# Patient Record
Sex: Male | Born: 1965 | Race: White | Hispanic: No | Smoking: Former smoker
Health system: Southern US, Community
[De-identification: ages and names within clinical notes are randomized; demographics above are authoritative.]

## PROBLEM LIST (undated history)

## (undated) DIAGNOSIS — B019 Varicella without complication: Secondary | ICD-10-CM

## (undated) DIAGNOSIS — T50995A Adverse effect of other drugs, medicaments and biological substances, initial encounter: Secondary | ICD-10-CM

## (undated) DIAGNOSIS — F418 Other specified anxiety disorders: Secondary | ICD-10-CM

## (undated) DIAGNOSIS — I10 Essential (primary) hypertension: Secondary | ICD-10-CM

## (undated) DIAGNOSIS — K55059 Acute (reversible) ischemia of intestine, part and extent unspecified: Secondary | ICD-10-CM

## (undated) DIAGNOSIS — I4891 Unspecified atrial fibrillation: Principal | ICD-10-CM

## (undated) DIAGNOSIS — R0683 Snoring: Secondary | ICD-10-CM

## (undated) DIAGNOSIS — I5022 Chronic systolic (congestive) heart failure: Secondary | ICD-10-CM

## (undated) DIAGNOSIS — Z7901 Long term (current) use of anticoagulants: Secondary | ICD-10-CM

## (undated) DIAGNOSIS — G43909 Migraine, unspecified, not intractable, without status migrainosus: Secondary | ICD-10-CM

## (undated) DIAGNOSIS — E669 Obesity, unspecified: Secondary | ICD-10-CM

## (undated) HISTORY — DX: Long term (current) use of anticoagulants: Z79.01

## (undated) HISTORY — DX: Obesity, unspecified: E66.9

## (undated) HISTORY — DX: Other specified anxiety disorders: F41.8

## (undated) HISTORY — PX: HERNIA REPAIR: SHX51

## (undated) HISTORY — PX: TONSILLECTOMY: SUR1361

## (undated) HISTORY — PX: COLON SURGERY: SHX602

## (undated) HISTORY — DX: Varicella without complication: B01.9

## (undated) HISTORY — DX: Migraine, unspecified, not intractable, without status migrainosus: G43.909

---

## 1999-02-23 ENCOUNTER — Emergency Department (HOSPITAL_COMMUNITY): Admission: EM | Admit: 1999-02-23 | Discharge: 1999-02-24 | Payer: Self-pay | Admitting: Emergency Medicine

## 1999-02-24 ENCOUNTER — Encounter: Payer: Self-pay | Admitting: Emergency Medicine

## 2000-09-26 ENCOUNTER — Emergency Department (HOSPITAL_COMMUNITY): Admission: EM | Admit: 2000-09-26 | Discharge: 2000-09-26 | Payer: Self-pay | Admitting: Emergency Medicine

## 2001-03-28 ENCOUNTER — Encounter: Payer: Self-pay | Admitting: Emergency Medicine

## 2001-03-28 ENCOUNTER — Emergency Department (HOSPITAL_COMMUNITY): Admission: EM | Admit: 2001-03-28 | Discharge: 2001-03-28 | Payer: Self-pay | Admitting: Emergency Medicine

## 2004-04-15 ENCOUNTER — Ambulatory Visit: Payer: Self-pay | Admitting: Internal Medicine

## 2004-05-10 ENCOUNTER — Emergency Department (HOSPITAL_COMMUNITY): Admission: EM | Admit: 2004-05-10 | Discharge: 2004-05-10 | Payer: Self-pay | Admitting: *Deleted

## 2005-04-13 ENCOUNTER — Ambulatory Visit: Payer: Self-pay | Admitting: Internal Medicine

## 2011-06-27 ENCOUNTER — Encounter: Payer: Self-pay | Admitting: Family Medicine

## 2011-06-27 ENCOUNTER — Ambulatory Visit (INDEPENDENT_AMBULATORY_CARE_PROVIDER_SITE_OTHER): Payer: Self-pay | Admitting: Family Medicine

## 2011-06-27 VITALS — BP 168/87 | HR 121 | Temp 97.4°F | Ht 70.0 in | Wt 320.8 lb

## 2011-06-27 DIAGNOSIS — E669 Obesity, unspecified: Secondary | ICD-10-CM

## 2011-06-27 DIAGNOSIS — F329 Major depressive disorder, single episode, unspecified: Secondary | ICD-10-CM

## 2011-06-27 DIAGNOSIS — G43909 Migraine, unspecified, not intractable, without status migrainosus: Secondary | ICD-10-CM

## 2011-06-27 DIAGNOSIS — J40 Bronchitis, not specified as acute or chronic: Secondary | ICD-10-CM

## 2011-06-27 DIAGNOSIS — F341 Dysthymic disorder: Secondary | ICD-10-CM

## 2011-06-27 DIAGNOSIS — R7309 Other abnormal glucose: Secondary | ICD-10-CM

## 2011-06-27 DIAGNOSIS — R739 Hyperglycemia, unspecified: Secondary | ICD-10-CM

## 2011-06-27 DIAGNOSIS — F418 Other specified anxiety disorders: Secondary | ICD-10-CM

## 2011-06-27 MED ORDER — CITALOPRAM HYDROBROMIDE 20 MG PO TABS: ORAL_TABLET | ORAL | Status: DC

## 2011-06-27 MED ORDER — AMOXICILLIN-POT CLAVULANATE 875-125 MG PO TABS: 1.0000 | ORAL_TABLET | Freq: Two times a day (BID) | ORAL | Status: AC

## 2011-06-27 NOTE — Patient Instructions (Signed)
Preventive Care for Adults, Male A healthy lifestyle and preventative care can promote health and wellness. Preventative health guidelines for men include the following key practices:  A routine yearly physical is a good way to check with your caregiver about your health and preventative screening. It is a chance to share any concerns and updates on your health, and to receive a thorough exam.   Visit your dentist for a routine exam and preventative care every 6 months. Brush your teeth twice a day and floss once a day. Good oral hygiene prevents tooth decay and gum disease.   The frequency of eye exams is based on your age, health, family medical history, use of contact lenses, and other factors. Follow your caregiver's recommendations for frequency of eye exams.   Eat a healthy diet. Foods like vegetables, fruits, whole grains, low-fat dairy products, and lean protein foods contain the nutrients you need without too many calories. Decrease your intake of foods high in solid fats, added sugars, and salt. Eat the right amount of calories for you.Get information about a proper diet from your caregiver, if necessary.   Regular physical exercise is one of the most important things you can do for your health. Most adults should get at least 150 minutes of moderate-intensity exercise (any activity that increases your heart rate and causes you to sweat) each week. In addition, most adults need muscle-strengthening exercises on 2 or more days a week.   Maintain a healthy weight. The body mass index (BMI) is a screening tool to identify possible weight problems. It provides an estimate of body fat based on height and weight. Your caregiver can help determine your BMI, and can help you achieve or maintain a healthy weight.For adults 20 years and older:   A BMI below 18.5 is considered underweight.   A BMI of 18.5 to 24.9 is normal.   A BMI of 25 to 29.9 is considered overweight.   A BMI of 30 and above  is considered obese.   Maintain normal blood lipids and cholesterol levels by exercising and minimizing your intake of saturated fat. Eat a balanced diet with plenty of fruit and vegetables. Blood tests for lipids and cholesterol should begin at age 20 and be repeated every 5 years. If your lipid or cholesterol levels are high, you are over 50, or you are a high risk for heart disease, you may need your cholesterol levels checked more frequently.Ongoing high lipid and cholesterol levels should be treated with medicines if diet and exercise are not effective.   If you smoke, find out from your caregiver how to quit. If you do not use tobacco, do not start.   If you choose to drink alcohol, do not exceed 2 drinks per day. One drink is considered to be 12 ounces (355 mL) of beer, 5 ounces (148 mL) of wine, or 1.5 ounces (44 mL) of liquor.   Avoid use of street drugs. Do not share needles with anyone. Ask for help if you need support or instructions about stopping the use of drugs.   High blood pressure causes heart disease and increases the risk of stroke. Your blood pressure should be checked at least every 1 to 2 years. Ongoing high blood pressure should be treated with medicines, if weight loss and exercise are not effective.   If you are 45 to 44 years old, ask your caregiver if you should take aspirin to prevent heart disease.   Diabetes screening involves taking a blood   sample to check your fasting blood sugar level. This should be done once every 3 years, after age 45, if you are within normal weight and without risk factors for diabetes. Testing should be considered at a younger age or be carried out more frequently if you are overweight and have at least 1 risk factor for diabetes.   Colorectal cancer can be detected and often prevented. Most routine colorectal cancer screening begins at the age of 50 and continues through age 75. However, your caregiver may recommend screening at an earlier  age if you have risk factors for colon cancer. On a yearly basis, your caregiver may provide home test kits to check for hidden blood in the stool. Use of a small camera at the end of a tube, to directly examine the colon (sigmoidoscopy or colonoscopy), can detect the earliest forms of colorectal cancer. Talk to your caregiver about this at age 50, when routine screening begins. Direct examination of the colon should be repeated every 5 to 10 years through age 75, unless early forms of pre-cancerous polyps or small growths are found.   Hepatitis C blood testing is recommended for all people born from 1945 through 1965 and any individual with known risks for hepatitis C.   Practice safe sex. Use condoms and avoid high-risk sexual practices to reduce the spread of sexually transmitted infections (STIs). STIs include gonorrhea, chlamydia, syphilis, trichomonas, herpes, HPV, and human immunodeficiency virus (HIV). Herpes, HIV, and HPV are viral illnesses that have no cure. They can result in disability, cancer, and death.   A one-time screening for abdominal aortic aneurysm (AAA) and surgical repair of large AAAs by sound wave imaging (ultrasonography) is recommended for ages 65 to 75 years who are current or former smokers.   Healthy men should no longer receive prostate-specific antigen (PSA) blood tests as part of routine cancer screening. Consult with your caregiver about prostate cancer screening.   Testicular cancer screening is not recommended for adult males who have no symptoms. Screening includes self-exam, caregiver exam, and other screening tests. Consult with your caregiver about any symptoms you have or any concerns you have about testicular cancer.   Use sunscreen with skin protection factor (SPF) of 30 or more. Apply sunscreen liberally and repeatedly throughout the day. You should seek shade when your shadow is shorter than you. Protect yourself by wearing long sleeves, pants, a  wide-brimmed hat, and sunglasses year round, whenever you are outdoors.   Once a month, do a whole body skin exam, using a mirror to look at the skin on your back. Notify your caregiver of new moles, moles that have irregular borders, moles that are larger than a pencil eraser, or moles that have changed in shape or color.   Stay current with required immunizations.   Influenza. You need a dose every fall (or winter). The composition of the flu vaccine changes each year, so being vaccinated once is not enough.   Pneumococcal polysaccharide. You need 1 to 2 doses if you smoke cigarettes or if you have certain chronic medical conditions. You need 1 dose at age 65 (or older) if you have never been vaccinated.   Tetanus, diphtheria, pertussis (Tdap, Td). Get 1 dose of Tdap vaccine if you are younger than age 65 years, are over 65 and have contact with an infant, are a healthcare worker, or simply want to be protected from whooping cough. After that, you need a Td booster dose every 10 years. Consult your caregiver if   you have not had at least 3 tetanus and diphtheria-containing shots sometime in your life or have a deep or dirty wound.   HPV. This vaccine is recommended for males 13 through 44 years of age. This vaccine may be given to men 22 through 44 years of age who have not completed the 3 dose series. It is recommended for men through age 26 who have sex with men or whose immune system is weakened because of HIV infection, other illness, or medications. The vaccine is given in 3 doses over 6 months.   Measles, mumps, rubella (MMR). You need at least 1 dose of MMR if you were born in 1957 or later. You may also need a 2nd dose.   Meningococcal. If you are age 19 to 21 years and a first-year college student living in a residence hall, or have one of several medical conditions, you need to get vaccinated against meningococcal disease. You may also need additional booster doses.   Zoster (shingles).  If you are age 60 years or older, you should get this vaccine.   Varicella (chickenpox). If you have never had chickenpox or you were vaccinated but received only 1 dose, talk to your caregiver to find out if you need this vaccine.   Hepatitis A. You need this vaccine if you have a specific risk factor for hepatitis A virus infection, or you simply wish to be protected from this disease. The vaccine is usually given as 2 doses, 6 to 18 months apart.   Hepatitis B. You need this vaccine if you have a specific risk factor for hepatitis B virus infection or you simply wish to be protected from this disease. The vaccine is given in 3 doses, usually over 6 months.  Preventative Service / Frequency Ages 19 to 39  Blood pressure check.** / Every 1 to 2 years.   Lipid and cholesterol check.** / Every 5 years beginning at age 20.   Hepatitis C blood test.** / For any individual with known risks for hepatitis C.   Skin self-exam. / Monthly.   Influenza immunization.** / Every year.   Pneumococcal polysaccharide immunization.** / 1 to 2 doses if you smoke cigarettes or if you have certain chronic medical conditions.   Tetanus, diphtheria, pertussis (Tdap,Td) immunization. / A one-time dose of Tdap vaccine. After that, you need a Td booster dose every 10 years.   HPV immunization. / 3 doses over 6 months, if 26 and younger.   Measles, mumps, rubella (MMR) immunization. / You need at least 1 dose of MMR if you were born in 1957 or later. You may also need a 2nd dose.   Meningococcal immunization. / 1 dose if you are age 19 to 21 years and a first-year college student living in a residence hall, or have one of several medical conditions, you need to get vaccinated against meningococcal disease. You may also need additional booster doses.   Varicella immunization.** / Consult your caregiver.   Hepatitis A immunization.** / Consult your caregiver. 2 doses, 6 to 18 months apart.   Hepatitis B  immunization.** / Consult your caregiver. 3 doses usually over 6 months.  Ages 40 to 64  Blood pressure check.** / Every 1 to 2 years.   Lipid and cholesterol check.** / Every 5 years beginning at age 20.   Fecal occult blood test (FOBT) of stool. / Every year beginning at age 50 and continuing until age 75. You may not have to do this test if   you get colonoscopy every 10 years.   Flexible sigmoidoscopy** or colonoscopy.** / Every 5 years for a flexible sigmoidoscopy or every 10 years for a colonoscopy beginning at age 50 and continuing until age 75.   Hepatitis C blood test.** / For all people born from 1945 through 1965 and any individual with known risks for hepatitis C.   Skin self-exam. / Monthly.   Influenza immunization.** / Every year.   Pneumococcal polysaccharide immunization.** / 1 to 2 doses if you smoke cigarettes or if you have certain chronic medical conditions.   Tetanus, diphtheria, pertussis (Tdap/Td) immunization.** / A one-time dose of Tdap vaccine. After that, you need a Td booster dose every 10 years.   Measles, mumps, rubella (MMR) immunization. / You need at least 1 dose of MMR if you were born in 1957 or later. You may also need a 2nd dose.   Varicella immunization.**/ Consult your caregiver.   Meningococcal immunization.** / Consult your caregiver.   Hepatitis A immunization.** / Consult your caregiver. 2 doses, 6 to 18 months apart.   Hepatitis B immunization.** / Consult your caregiver. 3 doses, usually over 6 months.  Ages 65 and over  Blood pressure check.** / Every 1 to 2 years.   Lipid and cholesterol check.**/ Every 5 years beginning at age 20.   Fecal occult blood test (FOBT) of stool. / Every year beginning at age 50 and continuing until age 75. You may not have to do this test if you get colonoscopy every 10 years.   Flexible sigmoidoscopy** or colonoscopy.** / Every 5 years for a flexible sigmoidoscopy or every 10 years for a colonoscopy  beginning at age 50 and continuing until age 75.   Hepatitis C blood test.** / For all people born from 1945 through 1965 and any individual with known risks for hepatitis C.   Abdominal aortic aneurysm (AAA) screening.** / A one-time screening for ages 65 to 75 years who are current or former smokers.   Skin self-exam. / Monthly.   Influenza immunization.** / Every year.   Pneumococcal polysaccharide immunization.** / 1 dose at age 65 (or older) if you have never been vaccinated.   Tetanus, diphtheria, pertussis (Tdap, Td) immunization. / A one-time dose of Tdap vaccine if you are over 65 and have contact with an infant, are a healthcare worker, or simply want to be protected from whooping cough. After that, you need a Td booster dose every 10 years.   Varicella immunization. ** / Consult your caregiver.   Meningococcal immunization.** / Consult your caregiver.   Hepatitis A immunization. ** / Consult your caregiver. 2 doses, 6 to 18 months apart.   Hepatitis B immunization.** / Check with your caregiver. 3 doses, usually over 6 months.  **Family history and personal history of risk and conditions may change your caregiver's recommendations. Document Released: 03/01/2001 Document Revised: 12/23/2010 Document Reviewed: 05/31/2010 [employer] Patient Information 2012 [employer]. 

## 2011-06-28 LAB — COMPREHENSIVE METABOLIC PANEL
ALT: 38 U/L (ref 0–53)
AST: 24 U/L (ref 0–37)
Albumin: 3.5 g/dL (ref 3.5–5.2)
Alkaline Phosphatase: 69 U/L (ref 39–117)
BUN: 14 mg/dL (ref 6–23)
CO2: 31 mEq/L (ref 19–32)
Calcium: 9.3 mg/dL (ref 8.4–10.5)
Chloride: 104 mEq/L (ref 96–112)
Creatinine, Ser: 1 mg/dL (ref 0.4–1.5)
GFR: 86.34 mL/min (ref >60.00)
Glucose, Bld: 110 mg/dL — ABNORMAL HIGH (ref 70–99)
Potassium: 4.5 mEq/L (ref 3.5–5.1)
Sodium: 140 mEq/L (ref 135–145)
Total Bilirubin: 0.4 mg/dL (ref 0.3–1.2)
Total Protein: 6.9 g/dL (ref 6.0–8.3)

## 2011-06-28 LAB — TSH: TSH: 0.9 u[IU]/mL (ref 0.35–5.50)

## 2011-06-29 ENCOUNTER — Encounter: Payer: Self-pay | Admitting: Family Medicine

## 2011-06-29 DIAGNOSIS — E669 Obesity, unspecified: Secondary | ICD-10-CM

## 2011-06-29 DIAGNOSIS — F418 Other specified anxiety disorders: Secondary | ICD-10-CM

## 2011-06-29 DIAGNOSIS — J40 Bronchitis, not specified as acute or chronic: Secondary | ICD-10-CM | POA: Insufficient documentation

## 2011-06-29 DIAGNOSIS — G43909 Migraine, unspecified, not intractable, without status migrainosus: Secondary | ICD-10-CM

## 2011-06-29 HISTORY — DX: Other specified anxiety disorders: F41.8

## 2011-06-29 HISTORY — DX: Obesity, unspecified: E66.9

## 2011-06-29 HISTORY — DX: Migraine, unspecified, not intractable, without status migrainosus: G43.909

## 2011-06-29 NOTE — Progress Notes (Signed)
Patient ID: William Fischer, male   DOB: 25-Aug-1965, 46 y.o.   MRN: [id number] William Fischer [id number] 08-31-1965 06/29/2011      Progress Note-Follow Up  Subjective  Chief Complaint  Chief Complaint  Patient presents with  . Establish Care    new patient  . chest congestion    X 1 week, wheezing    HPI  Patient is a 46 year old Caucasian male who is in today for urgent new patient appointment. He has been struggling with a cough and congestion now for roughly a week. He is struggling with bad headaches, congestion and rhinorrhea. He's been coughing sometimes productive sometimes dry. The cough is keeping him up at night. He is noting wheezing mostly at night. Has a lot of postnasal drip and cough some brownish phlegm. Denies any nausea vomiting but does have some loose stool daily. Does have some shortness of breath. Denies any chest pain or palpitations. Acknowledges she's under a great deal of stress and is dealing with some depression anxiety but does not have any suicidal ideation. He's been having a lot of headaches and does think that he would cover for them. Has had some recent fevers and chills. Is also noting that he is having trouble with his legs giving violaceous at times. No pain is associated no cold or hot sensation is noted.  Past Medical History  Diagnosis Date  . Chicken pox as a child  . Migraine 06/29/2011  . Bronchitis 06/29/2011  . Depression with anxiety 06/29/2011  . Obesity 06/29/2011    History reviewed. No pertinent past surgical history.  Family History  Problem Relation Age of Onset  . Hypertension Mother   . Hypertension Father   . Aneurysm Father   . Alcohol abuse Father   . Cancer Maternal Grandmother     brain  . Diabetes Maternal Grandfather     type 2  . Parkinsonism Maternal Grandfather   . Cancer Paternal Grandmother     breast  . Diabetes Paternal Grandmother   . Alcohol abuse Paternal Grandfather     History   Social History  .  Marital Status: Divorced    Spouse Name: N/A    Number of Children: N/A  . Years of Education: N/A   Occupational History  . Not on file.   Social History Main Topics  . Smoking status: Former Smoker -- 1.0 packs/day for 20 years    Types: Cigarettes    Quit date: 01/18/2000  . Smokeless tobacco: Never Used  . Alcohol Use: No  . Drug Use: No  . Sexually Active: No   Other Topics Concern  . Not on file   Social History Narrative  . No narrative on file    Current Outpatient Prescriptions on File Prior to Visit  Medication Sig Dispense Refill  . citalopram (CELEXA) 20 MG tablet 1/2 tab po daily x 7 days and then increase to 1 tab po daily  30 tablet  3    No Known Allergies  Review of Systems  Review of Systems  Constitutional: Negative for fever, chills and malaise/fatigue.  HENT: Positive for congestion and sore throat. Negative for hearing loss and nosebleeds.   Eyes: Negative for discharge.  Respiratory: Positive for cough, sputum production and shortness of breath. Negative for wheezing.   Cardiovascular: Negative for chest pain, palpitations and leg swelling.  Gastrointestinal: Negative for heartburn, nausea, vomiting, abdominal pain, diarrhea, constipation and blood in stool.  Genitourinary: Negative for dysuria, urgency, frequency and  hematuria.  Musculoskeletal: Negative for myalgias, back pain and falls.  Skin: Negative for rash.  Neurological: Positive for headaches. Negative for dizziness, tremors, sensory change, focal weakness, loss of consciousness and weakness.  Endo/Heme/Allergies: Negative for polydipsia. Does not bruise/bleed easily.  Psychiatric/Behavioral: Negative for depression and suicidal ideas. The patient is not nervous/anxious and does not have insomnia.     Objective  BP 168/87  Pulse 121  Temp 97.4 F (36.3 C) (Temporal)  Ht 5\' 10"  (1.778 m)  Wt 320 lb 12.8 oz (145.514 kg)  BMI 46.03 kg/m2  SpO2 95%  Physical Exam  Physical Exam   Constitutional: He is oriented to person, place, and time and well-developed, well-nourished, and in no distress. No distress.  HENT:  Head: Normocephalic and atraumatic.       TMs dull and fluid noted. Nasal mucosa boggy and erythematous  Eyes: Conjunctivae are normal.  Neck: Neck supple. No thyromegaly present.  Cardiovascular: Normal rate, regular rhythm and normal heart sounds.   No murmur heard. Pulmonary/Chest: Effort normal. No respiratory distress.       Decreased breath sounds b/l bases  Abdominal: He exhibits no distension and no mass. There is no tenderness.  Musculoskeletal: He exhibits no edema.  Neurological: He is alert and oriented to person, place, and time.  Skin: Skin is warm.  Psychiatric: Memory, affect and judgment normal.    Lab Results  Component Value Date   TSH 0.90 06/27/2011   No results found for this basename: WBC, HGB, HCT, MCV, PLT   Lab Results  Component Value Date   CREATININE 1.0 06/27/2011   BUN 14 06/27/2011   NA 140 06/27/2011   K 4.5 06/27/2011   CL 104 06/27/2011   CO2 31 06/27/2011   Lab Results  Component Value Date   ALT 38 06/27/2011   AST 24 06/27/2011   ALKPHOS 69 06/27/2011   BILITOT 0.4 06/27/2011     Assessment & Plan  Bronchitis Started on antibiotics and mucinex, increase rest and fluids, report if no improvement  Depression with anxiety Started on citalopram 10 mg po daily x 7days and then increase to 20 mg daily.  Migraine Takes too many Goody powders, encouraged to try and avoid them and try Excedrine Migraine according to the directions on the label, also discussed migraine hygiene  Obesity Given handout of DASH diet and encouraged to move more and eat less.

## 2011-06-29 NOTE — Assessment & Plan Note (Signed)
Takes too many Goody powders, encouraged to try and avoid them and try Excedrine Migraine according to the directions on the label, also discussed migraine hygiene

## 2011-06-29 NOTE — Assessment & Plan Note (Signed)
Started on antibiotics and mucinex, increase rest and fluids, report if no improvement

## 2011-06-29 NOTE — Assessment & Plan Note (Signed)
Started on citalopram 10 mg po daily x 7days and then increase to 20 mg daily.

## 2011-06-29 NOTE — Assessment & Plan Note (Signed)
Given handout of DASH diet and encouraged to move more and eat less.

## 2011-11-27 ENCOUNTER — Encounter (HOSPITAL_BASED_OUTPATIENT_CLINIC_OR_DEPARTMENT_OTHER): Payer: Self-pay | Admitting: *Deleted

## 2011-11-27 ENCOUNTER — Inpatient Hospital Stay (HOSPITAL_BASED_OUTPATIENT_CLINIC_OR_DEPARTMENT_OTHER)
Admission: EM | Admit: 2011-11-27 | Discharge: 2011-12-04 | DRG: 308 | Disposition: A | Discharge status: Home / Self Care | Payer: Medicaid Other | Attending: Internal Medicine | Admitting: Internal Medicine

## 2011-11-27 ENCOUNTER — Emergency Department (HOSPITAL_BASED_OUTPATIENT_CLINIC_OR_DEPARTMENT_OTHER): Payer: Medicaid Other

## 2011-11-27 DIAGNOSIS — I4891 Unspecified atrial fibrillation: Principal | ICD-10-CM | POA: Diagnosis present

## 2011-11-27 DIAGNOSIS — I4729 Other ventricular tachycardia: Secondary | ICD-10-CM | POA: Diagnosis not present

## 2011-11-27 DIAGNOSIS — Z6841 Body Mass Index (BMI) 40.0 and over, adult: Secondary | ICD-10-CM

## 2011-11-27 DIAGNOSIS — L270 Generalized skin eruption due to drugs and medicaments taken internally: Secondary | ICD-10-CM | POA: Diagnosis not present

## 2011-11-27 DIAGNOSIS — I509 Heart failure, unspecified: Secondary | ICD-10-CM | POA: Diagnosis present

## 2011-11-27 DIAGNOSIS — Z87891 Personal history of nicotine dependence: Secondary | ICD-10-CM

## 2011-11-27 DIAGNOSIS — T50995A Adverse effect of other drugs, medicaments and biological substances, initial encounter: Secondary | ICD-10-CM | POA: Diagnosis not present

## 2011-11-27 DIAGNOSIS — I472 Ventricular tachycardia, unspecified: Secondary | ICD-10-CM | POA: Diagnosis not present

## 2011-11-27 DIAGNOSIS — F329 Major depressive disorder, single episode, unspecified: Secondary | ICD-10-CM

## 2011-11-27 DIAGNOSIS — E739 Lactose intolerance, unspecified: Secondary | ICD-10-CM | POA: Diagnosis present

## 2011-11-27 DIAGNOSIS — Y9210 Unspecified residential institution as the place of occurrence of the external cause: Secondary | ICD-10-CM | POA: Diagnosis not present

## 2011-11-27 DIAGNOSIS — I428 Other cardiomyopathies: Secondary | ICD-10-CM | POA: Diagnosis present

## 2011-11-27 DIAGNOSIS — I5023 Acute on chronic systolic (congestive) heart failure: Secondary | ICD-10-CM | POA: Diagnosis present

## 2011-11-27 DIAGNOSIS — G4730 Sleep apnea, unspecified: Secondary | ICD-10-CM | POA: Diagnosis present

## 2011-11-27 DIAGNOSIS — I517 Cardiomegaly: Secondary | ICD-10-CM

## 2011-11-27 DIAGNOSIS — I5021 Acute systolic (congestive) heart failure: Secondary | ICD-10-CM

## 2011-11-27 DIAGNOSIS — F341 Dysthymic disorder: Secondary | ICD-10-CM | POA: Diagnosis present

## 2011-11-27 HISTORY — DX: Adverse effect of other drugs, medicaments and biological substances, initial encounter: T50.995A

## 2011-11-27 HISTORY — DX: Chronic systolic (congestive) heart failure: I50.22

## 2011-11-27 HISTORY — DX: Unspecified atrial fibrillation: I48.91

## 2011-11-27 HISTORY — DX: Snoring: R06.83

## 2011-11-27 LAB — CBC WITH DIFFERENTIAL/PLATELET
Eosinophils Relative: 4 % (ref 0–5)
HCT: 47.1 % (ref 39.0–52.0)
Lymphocytes Relative: 20 % (ref 12–46)
Lymphs Abs: 2.1 10*3/uL (ref 0.7–4.0)
MCV: 90.4 fL (ref 78.0–100.0)
Platelets: 316 10*3/uL (ref 150–400)
RBC: 5.21 MIL/uL (ref 4.22–5.81)
WBC: 10.7 10*3/uL — ABNORMAL HIGH (ref 4.0–10.5)

## 2011-11-27 LAB — COMPREHENSIVE METABOLIC PANEL
ALT: 55 U/L — ABNORMAL HIGH (ref 0–53)
Alkaline Phosphatase: 67 U/L (ref 39–117)
CO2: 25 mEq/L (ref 19–32)
Calcium: 9.6 mg/dL (ref 8.4–10.5)
GFR calc Af Amer: >90 mL/min (ref >90)
GFR calc non Af Amer: 90 mL/min — ABNORMAL LOW (ref >90)
Glucose, Bld: 115 mg/dL — ABNORMAL HIGH (ref 70–99)
Potassium: 4.2 mEq/L (ref 3.5–5.1)
Sodium: 140 mEq/L (ref 135–145)
Total Bilirubin: 0.7 mg/dL (ref 0.3–1.2)

## 2011-11-27 MED ORDER — DILTIAZEM HCL 100 MG IV SOLR
5.0000 mg/h | Freq: Once | INTRAVENOUS | Status: AC
  Administered 2011-11-27: 5 mg/h via INTRAVENOUS

## 2011-11-27 MED ORDER — FUROSEMIDE 10 MG/ML IJ SOLN
20.0000 mg | Freq: Every day | INTRAMUSCULAR | Status: DC
  Filled 2011-11-27: qty 2

## 2011-11-27 MED ORDER — DILTIAZEM HCL 100 MG IV SOLR
5.0000 mg/h | INTRAVENOUS | Status: DC
  Administered 2011-11-27: 15 mg/h via INTRAVENOUS
  Administered 2011-11-28 – 2011-11-30 (×10): 20 mg/h via INTRAVENOUS
  Filled 2011-11-27 (×2): qty 100

## 2011-11-27 MED ORDER — IOHEXOL 350 MG/ML SOLN
125.0000 mL | Freq: Once | INTRAVENOUS | Status: AC | PRN
  Administered 2011-11-27: 125 mL via INTRAVENOUS

## 2011-11-27 MED ORDER — ALPRAZOLAM 0.5 MG PO TABS
0.5000 mg | ORAL_TABLET | Freq: Three times a day (TID) | ORAL | Status: DC | PRN
  Administered 2011-11-27 – 2011-12-02 (×6): 0.5 mg via ORAL
  Filled 2011-11-27 (×7): qty 1

## 2011-11-27 MED ORDER — ASPIRIN 81 MG PO CHEW
324.0000 mg | CHEWABLE_TABLET | Freq: Once | ORAL | Status: AC
  Administered 2011-11-27: 324 mg via ORAL
  Filled 2011-11-27: qty 4

## 2011-11-27 MED ORDER — ASPIRIN EC 81 MG PO TBEC
81.0000 mg | DELAYED_RELEASE_TABLET | Freq: Every day | ORAL | Status: DC
  Administered 2011-11-28: 81 mg via ORAL
  Filled 2011-11-27 (×2): qty 1

## 2011-11-27 MED ORDER — ALBUTEROL SULFATE (5 MG/ML) 0.5% IN NEBU
INHALATION_SOLUTION | RESPIRATORY_TRACT | Status: AC
  Filled 2011-11-27: qty 1

## 2011-11-27 MED ORDER — INFLUENZA VIRUS VACC SPLIT PF IM SUSP
0.5000 mL | INTRAMUSCULAR | Status: AC
  Filled 2011-11-27: qty 0.5

## 2011-11-27 MED ORDER — SODIUM CHLORIDE 0.9 % IV SOLN
INTRAVENOUS | Status: DC | PRN
  Administered 2011-11-27: 21:00:00 via INTRAVENOUS
  Administered 2011-11-30: 500 mL via INTRAVENOUS

## 2011-11-27 MED ORDER — IPRATROPIUM BROMIDE 0.02 % IN SOLN
RESPIRATORY_TRACT | Status: AC
  Filled 2011-11-27: qty 2.5

## 2011-11-27 MED ORDER — ONDANSETRON HCL 4 MG/2ML IJ SOLN
4.0000 mg | Freq: Four times a day (QID) | INTRAMUSCULAR | Status: DC | PRN
  Filled 2011-11-27: qty 2

## 2011-11-27 MED ORDER — CITALOPRAM HYDROBROMIDE 20 MG PO TABS
20.0000 mg | ORAL_TABLET | Freq: Once | ORAL | Status: AC
  Administered 2011-11-27: 20 mg via ORAL
  Filled 2011-11-27: qty 1

## 2011-11-27 MED ORDER — DILTIAZEM HCL 100 MG IV SOLR
INTRAVENOUS | Status: AC
  Administered 2011-11-27: 20:00:00
  Filled 2011-11-27: qty 100

## 2011-11-27 MED ORDER — NITROGLYCERIN 0.4 MG SL SUBL: 0.4000 mg | SUBLINGUAL_TABLET | SUBLINGUAL | Status: DC | PRN

## 2011-11-27 MED ORDER — ENOXAPARIN SODIUM 40 MG/0.4ML ~~LOC~~ SOLN
40.0000 mg | Freq: Every day | SUBCUTANEOUS | Status: DC
  Administered 2011-11-27: 40 mg via SUBCUTANEOUS
  Filled 2011-11-27 (×2): qty 0.4

## 2011-11-27 MED ORDER — FUROSEMIDE 10 MG/ML IJ SOLN
40.0000 mg | Freq: Once | INTRAMUSCULAR | Status: AC
  Administered 2011-11-27: 40 mg via INTRAVENOUS
  Filled 2011-11-27: qty 4

## 2011-11-27 MED ORDER — DIPHENHYDRAMINE HCL 25 MG PO CAPS
25.0000 mg | ORAL_CAPSULE | Freq: Four times a day (QID) | ORAL | Status: DC | PRN
  Administered 2011-11-27 – 2011-12-01 (×2): 25 mg via ORAL
  Filled 2011-11-27 (×2): qty 1

## 2011-11-27 MED ORDER — DILTIAZEM HCL 100 MG IV SOLR
INTRAVENOUS | Status: AC
  Administered 2011-11-27: 10 mg
  Filled 2011-11-27: qty 100

## 2011-11-27 MED ORDER — BIOTENE DRY MOUTH MT LIQD
15.0000 mL | Freq: Two times a day (BID) | OROMUCOSAL | Status: DC
  Administered 2011-11-28 – 2011-12-01 (×7): 15 mL via OROMUCOSAL

## 2011-11-27 MED ORDER — ACETAMINOPHEN 325 MG PO TABS
650.0000 mg | ORAL_TABLET | ORAL | Status: DC | PRN
  Administered 2011-11-28 – 2011-12-01 (×5): 650 mg via ORAL
  Filled 2011-11-27 (×5): qty 2

## 2011-11-27 MED ORDER — PREDNISONE 50 MG PO TABS
60.0000 mg | ORAL_TABLET | Freq: Once | ORAL | Status: AC
  Administered 2011-11-27: 60 mg via ORAL
  Filled 2011-11-27: qty 1

## 2011-11-27 NOTE — ED Notes (Signed)
Pt was placed on monitor and EKG was done and given to EDP.  Family is at the bedside

## 2011-11-27 NOTE — ED Notes (Signed)
Pt presents to ED today with [facility] for the last 5 days that has steadily gotten worse.  Pt has no other cold URI sx

## 2011-11-27 NOTE — H&P (Signed)
William Fischer is an 46 y.o. male.    Chief Complaint: Shortness of breath for 5 days  HPI: 46 y/o male with no significant PMH presenting as a transfer from [place] where he was originally seen for evaluation of shortness of breath that started 5 days prior to presentation.  He reports rest and exertional shortness of breath for the last 5 days. He denies any chest pain, nausea, vomiting, PND or syncope.  At outside hospital, he was found to be in AFIB with RVR (134 bpm). He was started on Cardizem drip for rate control.  Because his D-Dimers was positive, he underwent CTA chest (PE protocol) which was negative for pulmonary embolus.  Unfortunately, he developed a diffuse macular rash all over his body afterwards.  He has no prior history of CHF, stroke, TIA, HTN or DM.  Currently, he is clinically stable.  Past Medical History  Diagnosis Date  . Chicken pox as a child  . Migraine 06/29/2011  . Bronchitis 06/29/2011  . Obesity 06/29/2011  . Depression with anxiety 06/29/2011    History reviewed. No pertinent past surgical history.  Family History  Problem Relation Age of Onset  . Hypertension Mother   . Hypertension Father   . Aneurysm Father   . Alcohol abuse Father   . Cancer Maternal Grandmother     brain  . Diabetes Maternal Grandfather     type 2  . Parkinsonism Maternal Grandfather   . Cancer Paternal Grandmother     breast  . Diabetes Paternal Grandmother   . Alcohol abuse Paternal Grandfather    Social History:  reports that he quit smoking about 11 years ago. His smoking use included Cigarettes. He has a 20 pack-year smoking history. He has never used smokeless tobacco. He reports that he does not drink alcohol or use illicit drugs.  Allergies: No Known Allergies  Medications Prior to Admission  Medication Sig Dispense Refill  . Ascorbic Acid (VITAMIN C) 1000 MG tablet Take 1,000 mg by mouth daily.      [name] b complex vitamins tablet Take 1 tablet by mouth daily.        . citalopram (CELEXA) 20 MG tablet 1/2 tab po daily x 7 days and then increase to 1 tab po daily  30 tablet  3  . Multiple Vitamins-Minerals (ONE-A-DAY MENS HEALTH FORMULA PO) Take 1 tablet by mouth daily.      . Pseudoeph-Doxylamine-DM-APAP (NYQUIL PO) Take by mouth.        Results for orders placed during the hospital encounter of 11/27/11 (from the past 48 hour(s))  TROPONIN I     Status: Normal   Collection Time   11/27/11  2:20 PM      Component Value Range Comment   Troponin I <0.30  <0.30 ng/mL   D-DIMER, QUANTITATIVE     Status: Abnormal   Collection Time   11/27/11  2:20 PM      Component Value Range Comment   D-Dimer, Quant 2.16 (*) 0.00 - 0.48 ug/mL-FEU   CBC WITH DIFFERENTIAL     Status: Abnormal   Collection Time   11/27/11  2:20 PM      Component Value Range Comment   WBC 10.7 (*) 4.0 - 10.5 K/uL    RBC 5.21  4.22 - 5.81 MIL/uL    Hemoglobin 15.8  13.0 - 17.0 g/dL    HCT 16.1  09.6 - 04.5 %    MCV 90.4  78.0 - 100.0 fL  MCH 30.3  26.0 - 34.0 pg    MCHC 33.5  30.0 - 36.0 g/dL    RDW 45.4  09.8 - 11.9 %    Platelets 316  150 - 400 K/uL    Neutrophils Relative 68  43 - 77 %    Neutro Abs 7.2  1.7 - 7.7 K/uL    Lymphocytes Relative 20  12 - 46 %    Lymphs Abs 2.1  0.7 - 4.0 K/uL    Monocytes Relative 9  3 - 12 %    Monocytes Absolute 0.9  0.1 - 1.0 K/uL    Eosinophils Relative 4  0 - 5 %    Eosinophils Absolute 0.4  0.0 - 0.7 K/uL    Basophils Relative 0  0 - 1 %    Basophils Absolute 0.0  0.0 - 0.1 K/uL   COMPREHENSIVE METABOLIC PANEL     Status: Abnormal   Collection Time   11/27/11  2:20 PM      Component Value Range Comment   Sodium 140  135 - 145 mEq/L    Potassium 4.2  3.5 - 5.1 mEq/L    Chloride 102  96 - 112 mEq/L    CO2 25  19 - 32 mEq/L    Glucose, Bld 115 (*) 70 - 99 mg/dL    BUN 19  6 - 23 mg/dL    Creatinine, Ser 1.47  0.50 - 1.35 mg/dL    Calcium 9.6  8.4 - 82.9 mg/dL    Total Protein 7.5  6.0 - 8.3 g/dL    Albumin 3.6  3.5 - 5.2 g/dL     AST 31  0 - 37 U/L    ALT 55 (*) 0 - 53 U/L    Alkaline Phosphatase 67  39 - 117 U/L    Total Bilirubin 0.7  0.3 - 1.2 mg/dL    GFR calc non Af Amer 90 (*) >90 mL/min    GFR calc Af Amer >90  >90 mL/min    Ct Angio Chest Pe W/cm &/or Wo Cm  11/27/2011  *RADIOLOGY REPORT*  Clinical Data: Elevated D-dimer.  Rule out pulmonary embolus. Shortness of breath.  Increased heart rate.  CT ANGIOGRAPHY CHEST  Technique:  Multidetector CT imaging of the chest using the standard protocol during bolus administration of intravenous contrast. Multiplanar reconstructed images including MIPs were obtained and reviewed to evaluate the vascular anatomy.  Contrast: OMNIPAQUE IOHEXOL 350 MG/ML SOLN  Comparison: Chest x-ray 11/27/2011  Findings: The pulmonary arteries are well opacified.  There is no evidence for acute pulmonary embolus.  The heart is enlarged.  There are diffusely slightly enlarged mediastinal and hilar lymph nodes.  Largest nodes are identified in the mediastinum, measuring 2.2 cm in the prevascular space and 2.3 cm in the right paratracheal region.  There are bilateral pleural effusions, small.  Patchy mosaic changes are identified within the lung bases dependently, most consistent with mild pulmonary edema.  Images of the upper abdomen show a small amount.  Pelvic fluid.  Visualized osseous structures have a normal appearance.  IMPRESSION:  1.  Technically adequate exam showing no evidence for acute pulmonary embolus. 2.  Cardiomegaly and changes of pulmonary edema. 3.  Mediastinal and hilar lymph nodes are favored to be related to pulmonary edema.   Original Report Authenticated By: Norva Pavlov, M.D.    Dg Chest Port 1 View  11/27/2011  *RADIOLOGY REPORT*  Clinical Data: Shortness of breath.  PORTABLE CHEST - 1  VIEW  Comparison: None.  Findings: There is evidence of moderate CHF and associated cardiomegaly.  No significant pleural effusions.  IMPRESSION: CHF and cardiomegaly.   Original  Report Authenticated By: Irish Lack, M.D.     Review of Systems  Constitutional: Negative for fever, chills, weight loss, malaise/fatigue and diaphoresis.  HENT: Negative for hearing loss, ear pain, nosebleeds, congestion, neck pain, tinnitus and ear discharge.   Eyes: Negative for blurred vision, double vision, photophobia, pain and discharge.  Respiratory: Positive for shortness of breath. Negative for cough, hemoptysis, sputum production, wheezing and stridor.   Cardiovascular: Negative for chest pain, palpitations, orthopnea, claudication, leg swelling and PND.  Gastrointestinal: Negative for heartburn, nausea, vomiting, abdominal pain, diarrhea, constipation, blood in stool and melena.  Genitourinary: Negative for dysuria, urgency and frequency.  Musculoskeletal: Negative for myalgias, back pain and joint pain.  Skin: Positive for rash. Negative for itching.  Neurological: Negative for dizziness, tingling, tremors, sensory change, speech change, focal weakness, seizures, weakness and headaches.  Psychiatric/Behavioral: Negative for hallucinations. The patient does not have insomnia.     Blood pressure 120/78, pulse 111, temperature 97.9 F (36.6 C), temperature source Oral, resp. rate 26, height 5\' 11"  (1.803 m), weight 147.8 kg (325 lb 13.4 oz), SpO2 95.00%. Physical Exam  Constitutional: He is oriented to person, place, and time. He appears well-developed and well-nourished. No distress.  HENT:  Head: Normocephalic and atraumatic.  Eyes: EOM are normal. No scleral icterus.  Neck: No JVD present. No thyromegaly present.  Cardiovascular: Exam reveals no friction rub.   No murmur heard.      Irregularly irregular rhythm  Respiratory: No respiratory distress. He has no wheezes. He has no rales. He exhibits no tenderness.  GI: He exhibits no distension. There is no tenderness. There is no rebound and no guarding.  Musculoskeletal: He exhibits edema. He exhibits no tenderness.   Neurological: He is alert and oriented to person, place, and time.  Skin: Rash noted. He is not diaphoretic. No pallor.  Psychiatric: He has a normal mood and affect.     Assessment/Plan  1. AFIB with rapid ventricular response rate 2. Pulmonary edema 3. Diffuse macular rash s/p CTA chest likely secondary to contrast allergy  I will admit patient to cardiology to be observed on telemetry.  Patient is already on Cardizem drip which will be continued for now.  His CHADS-VASc score = 0 at the current time. I will keep him NPO for a TEE/cardioversion in the morning. I will start gentle diuresis tonight and obtain TEE tomorrow to see if he has LV systolic or diastolic heart failure.  Regarding his contrast allergy, patient will be give steroid and Benadryl.   Evonda Enge E 11/27/2011, 10:03 PM

## 2011-11-27 NOTE — ED Notes (Signed)
Pt states he has voided twice more.

## 2011-11-27 NOTE — ED Notes (Signed)
Cardizem drip switched to new IV site right hand for purposes of scan. Left AC site flushes easily.

## 2011-11-27 NOTE — ED Provider Notes (Signed)
History     CSN: [id number]  Arrival date & time 11/27/11  1407   First MD Initiated Contact with Patient 11/27/11 1421      Chief Complaint  Patient presents with  . Shortness of Breath    (Consider location/radiation/quality/duration/timing/severity/associated sxs/prior treatment) Patient is a 46 y.o. male presenting with shortness of breath. The history is provided by the patient. No language interpreter was used.  Shortness of Breath  Episode onset: 5 days. The onset was gradual. The problem occurs continuously. The problem has been gradually worsening. The problem is severe. Nothing relieves the symptoms. Nothing aggravates the symptoms. Associated symptoms include shortness of breath. There was no intake of a foreign body. The Heimlich maneuver was not attempted. He has not inhaled smoke recently. He has had no prior steroid use. He has had no prior hospitalizations. He has had no prior ICU admissions. He has had no prior intubations. His past medical history does not include asthma.  Pt reports he began feeling sick about 5 days ago.  Pt reports he used a flea bomb and began feeling short of breath afterwards.  Pt thought it might be the chemical that made him short of breath.   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 Chicken pox as a child  . Migraine 06/29/2011  . Bronchitis 06/29/2011  . Depression with anxiety 06/29/2011  . Obesity 06/29/2011    History reviewed. No pertinent past surgical history.  Family History  Problem Relation Age of Onset  . Hypertension Mother   . Hypertension Father   . Aneurysm Father   . Alcohol abuse Father   . Cancer Maternal Grandmother     brain  . Diabetes Maternal Grandfather     type 2  . Parkinsonism Maternal Grandfather   . Cancer Paternal Grandmother     breast  . Diabetes Paternal Grandmother   . Alcohol abuse Paternal Grandfather     History  Substance Use Topics  .  Smoking status: Former Smoker -- 1.0 packs/day for 20 years    Types: Cigarettes    Quit date: 01/18/2000  . Smokeless tobacco: Never Used  . Alcohol Use: No      Review of Systems  Respiratory: Positive for shortness of breath.   All other systems reviewed and are negative.    Allergies  Review of patient's allergies indicates no known allergies.  Home Medications   Current Outpatient Rx  Name  Route  Sig  Dispense  Refill  . VITAMIN C 1000 MG PO TABS   Oral   Take 1,000 mg by mouth daily.         . B COMPLEX PO TABS   Oral   Take 1 tablet by mouth daily.         [name] CITALOPRAM HYDROBROMIDE 20 MG PO TABS      1/2 tab po daily x 7 days and then increase to 1 tab po daily   30 tablet   3   . ONE-A-DAY MENS HEALTH FORMULA PO   Oral   Take 1 tablet by mouth daily.         . NYQUIL PO   Oral   Take by mouth.           BP 126/103  Pulse 134  Temp 97.8 F (36.6 C) (Oral)  Resp 28  SpO2 93%  Physical Exam  Nursing note and vitals reviewed. Constitutional: He is  oriented to person, place, and time. He appears well-developed and well-nourished.  HENT:  Head: Normocephalic.  Eyes: Conjunctivae normal are normal. Pupils are equal, round, and reactive to light.  Neck: Normal range of motion. Neck supple.  Cardiovascular:       Rapid tachycardic  Pulmonary/Chest: Effort normal.  Abdominal: Soft. Bowel sounds are normal.  Musculoskeletal: Normal range of motion.  Neurological: He is alert and oriented to person, place, and time.  Skin: Skin is warm.  Psychiatric: He has a normal mood and affect.    ED Course  Procedures (including critical care time)  Labs Reviewed  CBC WITH DIFFERENTIAL - Abnormal; Notable for the following:    WBC 10.7 (*)     All other components within normal limits  TROPONIN I  D-DIMER, QUANTITATIVE  COMPREHENSIVE METABOLIC PANEL   No results found.   No diagnosis found.  Patient was given 10 of Cardizem IV with no  change in rhythm he was started on a Cardizem drip at 5 and increased to 15 with no change in rate. Patient's d-dimer returned and was positive Patient's CT scan shows no PE. There are diffuse hilar lymph nodes pulmonary edema and cardiomegaly I spoke to Dr. Hillis Range cardiology on call for cardiology and patient will be admitted to [facility] for further treatment. He advised to keep patient at a Cardizem drip rate of 15 and to give Lasix 40 mg IV. Patient is advised of EKG lab and x-ray findings and the need for transfer to [place] come.  MDM     Rate: 143  Rhythm: atrial fibrillation  QRS Axis: right  Intervals: normal  ST/T Wave abnormalities: normal  Conduction Disutrbances:none  Narrative Interpretation:   Old EKG Reviewed: changes noted  CRITICAL CARE Performed by: [facility]   Total critical care time: 30 minutes  Critical care time was exclusive of separately billable procedures and treating other patients.  Critical care was necessary to treat or prevent imminent or life-threatening deterioration.  Critical care was time spent personally by me on the following activities: development of treatment plan with patient and/or surrogate as well as nursing, discussions with consultants, evaluation of patient's response to treatment, examination of patient, obtaining history from patient or surrogate, ordering and performing treatments and interventions, ordering and review of laboratory studies, ordering and review of radiographic studies, pulse oximetry and re-evaluation of patient's condition.  Lonia Skinner [place], [place] 11/27/11 [phone]

## 2011-11-27 NOTE — ED Notes (Addendum)
Patient states he has been short of breath for the past 4-5 days. States it got worse today. Patient is visibly short of breath with increased work of breathing. Notified Dr. Ranae Palms who is going to assess patient prior to administering a nebulizer treatment. Rt will continue to monitor.

## 2011-11-27 NOTE — ED Notes (Signed)
O2 increased to 4L/[place] for sats 92-93 %

## 2011-11-27 NOTE — ED Notes (Signed)
PCXR being done.

## 2011-11-27 NOTE — ED Notes (Signed)
Pt states he feels the med is helping. Encouraged to continue to work on his breathing.

## 2011-11-27 NOTE — ED Notes (Signed)
MD at bedside. 

## 2011-11-27 NOTE — ED Notes (Signed)
Returned from CT. Pt states he is feeling much better.

## 2011-11-27 NOTE — ED Notes (Signed)
Pt accompanied to CT on monitor by this RN. Tolerated procedure well.

## 2011-11-27 NOTE — ED Notes (Signed)
Sats dropped to 91% on RA. Placed on O2 2L/[place]

## 2011-11-28 ENCOUNTER — Inpatient Hospital Stay (HOSPITAL_COMMUNITY): Payer: Medicaid Other

## 2011-11-28 DIAGNOSIS — I509 Heart failure, unspecified: Secondary | ICD-10-CM

## 2011-11-28 DIAGNOSIS — I4891 Unspecified atrial fibrillation: Principal | ICD-10-CM

## 2011-11-28 LAB — TROPONIN I
Troponin I: <0.3 ng/mL (ref <0.30)
Troponin I: <0.3 ng/mL (ref <0.30)
Troponin I: <0.3 ng/mL (ref <0.30)

## 2011-11-28 LAB — LIPID PANEL
LDL Cholesterol: 97 mg/dL (ref 0–99)
Triglycerides: 50 mg/dL (ref <150)
VLDL: 10 mg/dL (ref 0–40)

## 2011-11-28 LAB — BASIC METABOLIC PANEL
BUN: 18 mg/dL (ref 6–23)
Calcium: 8.6 mg/dL (ref 8.4–10.5)
GFR calc Af Amer: >90 mL/min (ref >90)
GFR calc non Af Amer: 86 mL/min — ABNORMAL LOW (ref >90)
GFR calc non Af Amer: 78 mL/min — ABNORMAL LOW (ref >90)
Glucose, Bld: 178 mg/dL — ABNORMAL HIGH (ref 70–99)
Potassium: 4.4 mEq/L (ref 3.5–5.1)
Sodium: 137 mEq/L (ref 135–145)
Sodium: 138 mEq/L (ref 135–145)

## 2011-11-28 LAB — TSH: TSH: 1.79 u[IU]/mL (ref 0.350–4.500)

## 2011-11-28 LAB — CREATININE, SERUM: GFR calc Af Amer: >90 mL/min (ref >90)

## 2011-11-28 LAB — CBC
MCV: 92.8 fL (ref 78.0–100.0)
Platelets: 327 10*3/uL (ref 150–400)
RDW: 14.9 % (ref 11.5–15.5)
WBC: 10.4 10*3/uL (ref 4.0–10.5)

## 2011-11-28 LAB — MAGNESIUM: Magnesium: 2 mg/dL (ref 1.5–2.5)

## 2011-11-28 LAB — PROTIME-INR: Prothrombin Time: 15 seconds (ref 11.6–15.2)

## 2011-11-28 MED ORDER — DIGOXIN 125 MCG PO TABS
0.1250 mg | ORAL_TABLET | Freq: Every day | ORAL | Status: DC
  Administered 2011-11-28 – 2011-11-30 (×3): 0.125 mg via ORAL
  Filled 2011-11-28 (×4): qty 1

## 2011-11-28 MED ORDER — FUROSEMIDE 10 MG/ML IJ SOLN
80.0000 mg | Freq: Three times a day (TID) | INTRAMUSCULAR | Status: DC
  Administered 2011-11-28 – 2011-11-29 (×4): 80 mg via INTRAVENOUS
  Filled 2011-11-28 (×7): qty 8

## 2011-11-28 MED ORDER — FUROSEMIDE 10 MG/ML IJ SOLN
INTRAMUSCULAR | Status: AC
  Administered 2011-11-28: 40 mg via INTRAVENOUS
  Filled 2011-11-28: qty 4

## 2011-11-28 MED ORDER — POTASSIUM CHLORIDE CRYS ER 20 MEQ PO TBCR
40.0000 meq | EXTENDED_RELEASE_TABLET | Freq: Once | ORAL | Status: AC
  Administered 2011-11-28: 40 meq via ORAL
  Filled 2011-11-28: qty 2

## 2011-11-28 MED ORDER — DABIGATRAN ETEXILATE MESYLATE 150 MG PO CAPS
150.0000 mg | ORAL_CAPSULE | Freq: Two times a day (BID) | ORAL | Status: DC
  Administered 2011-11-28 – 2011-12-04 (×13): 150 mg via ORAL
  Filled 2011-11-28 (×14): qty 1

## 2011-11-28 MED ORDER — FUROSEMIDE 10 MG/ML IJ SOLN
40.0000 mg | Freq: Once | INTRAMUSCULAR | Status: AC
  Administered 2011-11-28: 40 mg via INTRAVENOUS

## 2011-11-28 MED ORDER — ALBUTEROL SULFATE (5 MG/ML) 0.5% IN NEBU: 2.5000 mg | INHALATION_SOLUTION | Freq: Four times a day (QID) | RESPIRATORY_TRACT | Status: DC | PRN

## 2011-11-28 NOTE — ED Provider Notes (Signed)
Medical screening examination/treatment/procedure(s) were conducted as a shared visit with Langston Masker) and myself.  I personally evaluated the patient during the encounter.  The patient presents complaining of a five day history of weakness, shortness of breath with exertion, and palpitations.  He denies chest pain, fevers, productive cough or chills.  On exam, the patient is afebrile.  The vitals are stable with the exception of a rapid, irregular heart rate.  The heart is irregular irregular and tachycardic.  The lungs have slight rales in the bases.  There is no edema.  The abdomen is benign.    Workup was initiated including ekg, labs, and chest xray.  These revealed atrial fib with rvr, cardiomegaly and apparent chf.  The troponin was negative however the ddimer was positive.  A ct scan revealed no evidence for pe.  He was started on a cardizem drip which had to be titrated upward to 15 mg in order to control his rate.  He was given lasix with diuresis.  Cardiology has been consulted and arrangements have been made for admission to [facility].  Geoffery Lyons, MD 11/28/11 [phone]

## 2011-11-28 NOTE — Progress Notes (Signed)
  Echocardiogram 2D Echocardiogram has been performed.  Cathie Beams 11/28/2011, 12:21 PM

## 2011-11-28 NOTE — Progress Notes (Addendum)
Patient: William Fischer Date of Encounter: 11/28/2011, 6:40 AM Admit date: 11/27/2011     Subjective  Desat to 88-89% on 4 L O2 early this am. Lasix 40 mg IV x 1 given with UO 900 ml.  Patient states that he feels much better this am. No SOB or chest pain when resting on the bed. Reports intermittent chest pressure associated with his fast heart rate.    Objective  Physical Exam: Vitals: BP 130/79  Pulse 115  Temp 98.5 F (36.9 C) (Oral)  Resp 27  Ht 5\' 11"  (1.803 m)  Wt 323 lb 6.6 oz (146.7 kg)  BMI 45.11 kg/m2  SpO2 93% General: NAD Neck: Supple. JVD 7-8. Lungs: Bilateral lung sounds rales noted.  Heart: Irregularly irregular rhythm without murmurs, rubs, or gallops.  Abdomen: Soft, non-distended. Extremities: No clubbing or cyanosis. No edema.  Distal pedal pulses are 2+ and equal bilaterally. Neuro: Alert and oriented X 3. Moves all extremities spontaneously. No focal deficits. Skin: No rash  Intake/Output:  Intake/Output Summary (Last 24 hours) at 11/28/11 0640 Last data filed at 11/28/11 0600  Gross per 24 hour  Intake 682.17 ml  Output   1725 ml  Net -1042.83 ml    Inpatient Medications:     . antiseptic oral rinse  15 mL Mouth Rinse BID  . [COMPLETED] aspirin  324 mg Oral Once  . aspirin EC  81 mg Oral Daily  . [COMPLETED] citalopram  20 mg Oral Once  . [COMPLETED] diltiazem (CARDIZEM) infusion  5-15 mg/hr Intravenous Once  . [COMPLETED] diltiazem      . [COMPLETED] diltiazem      . enoxaparin (LOVENOX) injection  40 mg Subcutaneous QHS  . furosemide  20 mg Intravenous Daily  . [COMPLETED] furosemide  40 mg Intravenous Once  . [COMPLETED] furosemide  40 mg Intravenous Once  . influenza  inactive virus vaccine  0.5 mL Intramuscular Tomorrow-1000  . [COMPLETED] predniSONE  60 mg Oral Once      . sodium chloride 10 mL/hr at 11/27/11 2100  . diltiazem (CARDIZEM) infusion 15 mg/hr (11/27/11 2307)    Labs:  [facility] 11/27/11 2319 11/27/11 1420   NA -- 140  K -- 4.2  CL -- 102  CO2 -- 25  GLUCOSE -- 115*  BUN -- 19  CREATININE 1.02 1.00  CALCIUM -- 9.6  MG 2.0 --  PHOS -- --    Basename 11/27/11 1420  AST 31  ALT 55*  ALKPHOS 67  BILITOT 0.7  PROT 7.5  ALBUMIN 3.6    Basename 11/27/11 2319 11/27/11 1420  WBC 10.4 10.7*  NEUTROABS -- 7.2  HGB 14.4 15.8  HCT 44.0 47.1  MCV 92.8 90.4  PLT 327 316    Basename 11/27/11 2319 11/27/11 1420  CKTOTAL -- --  CKMB -- --  TROPONINI <0.30 <0.30    Basename 11/27/11 1420  DDIMER 2.16*    Radiology/Studies: Ct Angio Chest Pe W/cm &/or Wo Cm  11/27/2011  *RADIOLOGY REPORT*  Clinical Data: Elevated D-dimer.  Rule out pulmonary embolus. Shortness of breath.  Increased heart rate.  CT ANGIOGRAPHY CHEST  Technique:  Multidetector CT imaging of the chest using the standard protocol during bolus administration of intravenous contrast. Multiplanar reconstructed images including MIPs were obtained and reviewed to evaluate the vascular anatomy.  Contrast: OMNIPAQUE IOHEXOL 350 MG/ML SOLN  Comparison: Chest x-ray 11/27/2011  Findings: The pulmonary arteries are well opacified.  There is no evidence for acute pulmonary embolus.  The heart is enlarged.  There are diffusely slightly enlarged mediastinal and hilar lymph nodes.  Largest nodes are identified in the mediastinum, measuring 2.2 cm in the prevascular space and 2.3 cm in the right paratracheal region.  There are bilateral pleural effusions, small.  Patchy mosaic changes are identified within the lung bases dependently, most consistent with mild pulmonary edema.  Images of the upper abdomen show a small amount.  Pelvic fluid.  Visualized osseous structures have a normal appearance.  IMPRESSION:  1.  Technically adequate exam showing no evidence for acute pulmonary embolus. 2.  Cardiomegaly and changes of pulmonary edema. 3.  Mediastinal and hilar lymph nodes are favored to be related to pulmonary edema.   Original Report  Authenticated By: Norva Pavlov, M.D.    Dg Chest Port 1 View  11/27/2011  *RADIOLOGY REPORT*  Clinical Data: Shortness of breath.  PORTABLE CHEST - 1 VIEW  Comparison: None.  Findings: There is evidence of moderate CHF and associated cardiomegaly.  No significant pleural effusions.  IMPRESSION: CHF and cardiomegaly.   Original Report Authenticated By: Irish Lack, M.D.    Telemetry: Afib with HR 115   Assessment and Plan  46 y/o male with no significant PMH was admitted for evaluation of 5-day shortness of breath. Cardizem drip was initiated for treatment of  AFIB with RVR (134 bpm). Because his D-Dimers was positive, he underwent CTA chest (PE protocol) which was negative for pulmonary embolus. Unfortunately, he developed a diffuse macular rash all over his body afterwards.   1. AFib with RVR Patient presented with 5-day Acute CHF likely associated with Afib with RVR. On Cardizem gtt at 15 for rate control. Received Lasix 40 IV x 2 since admission with UO  ~ 2L. He also reports UO of 1.5 L at [place] with Lasix.  - Still fluid overload on exam, reports his dry weight 311 lbs, Thus he is likely 10-12 lbs overload.  - consider to increase his Lasix to 80 IV x 2, consider adding BB, ACEI, statin pending TEE - On Cardizem for Afib and rate not well controlled--Titrate to 20 - consider TEE and Cardioversion. CHAD2 score 0 - Risk stratification-- Lipid panel, TSH, HBA1C, obesity  2. Acute CHF, see above 3. Diffuse macular rash s/p CTA chest likely secondary to contrast allergy, resolved after one dose of prednisone.  4. Morbid obesity, BMI 45.5     Need to find PCP and discuss weight loss and possible Bariatric surgery, like Lap band.    Signed, LI, NA  PGY-2 6:58 AM   I have personally seen and examined this patient with Dr. Ian Bushman. I agree with the assessment and plan as outlined above. He is currently in atrial fibrillation with ventricular rate of 110-120 bpm. He has not been on  full dose anti-coagulation. We will continue cardizem drip for rate control today and will start a heparin drip per pharmacy. Will get bedside echo today. If he does not convert to sinus today, will plan TEE guided DCCV tomorrow. NPO after midnight. Will increase Lasix to 80 mg IV TID. CXR with evidence of pulm edema.   Verlan Grotz 8:21 AM 11/28/2011  Addendum: Will not start heparin drip. Will start Pradaxa 150 mg po BID. Will need five doses then TEE/DCCV on Wednesday.   Cyruss Arata 8:59 AM 11/28/2011

## 2011-11-28 NOTE — Care Management Note (Addendum)
    Page 1 of 2   12/02/2011     10:17:35 AM   CARE MANAGEMENT NOTE 12/02/2011  Patient:  William Fischer, William Fischer   Account Number:  [id number]  Date Initiated:  11/28/2011  Documentation initiated by:  Junius Creamer  Subjective/Objective Assessment:   adm w arrthymia     Action/Plan:   lives w fam, pcp dr Kennyth Arnold blyth   Anticipated DC Date:     Anticipated DC Plan:    In-house referral  Financial Counselor      DC Planning Services  CM consult  Medication Assistance  [facility] Program      Choice offered to / List presented to:             Status of service:   Medicare Important Message given?   (If response is "NO", the following Medicare IM given date fields will be blank) Date Medicare IM given:   Date Additional Medicare IM given:    Discharge Disposition:    Per UR Regulation:  Reviewed for med. necessity/level of care/duration of stay  If discussed at Long Length of Stay Meetings, dates discussed:    Comments:  11/15 10:15a debbie Niccole Witthuhn rn,bsn p got match program to pay for 34days of meds at disch. pt also has 30day free pradaxa card. match program inf left in room w pt. md changed tikosyn to sotolol which is generic.  11/14 10a debbie Hendry Speas rn,bsn gave pt 10.00 copay card for tikosy, filled out tikosyn pt assist form and await md signature. working on pt assist thru hosp to help w meds w first dc from hospital.  11/13 11:21a debbie Farryn Linares rn,bsn faxed in pradxa pt assist form. pt also has card for 30days free.  11/12 10:49a debbie Juanell Saffo rn,bsn have asked financial co to see if pt elid for medicaid since he states his kids have medicaid. will give pt prim care resources list so he can poss find new pcp.  11/11 12n debbie Wardell Pokorski rn,bsn [phone] spoke w pt. he has been given pradaxa 30day free card. pt has no ins. gave in card that may help w brand name copays. placed pradaxa pt assist form on chart for md to sign.

## 2011-11-28 NOTE — Progress Notes (Signed)
Patient's O2 sat's dropping to 88-89% with oxygen at 4L/[place]. Patient admits to SOB when questioned.  Lungs with continued decreased breath sounds, but now expiratory wheezes also audible bilaterally.  [facility] notified of change in patient's status and came to unit to assess patient.

## 2011-11-29 LAB — BASIC METABOLIC PANEL
BUN: 23 mg/dL (ref 6–23)
Creatinine, Ser: 1.14 mg/dL (ref 0.50–1.35)
GFR calc Af Amer: 89 mL/min — ABNORMAL LOW (ref >90)
GFR calc non Af Amer: 77 mL/min — ABNORMAL LOW (ref >90)
Potassium: 3.5 mEq/L (ref 3.5–5.1)

## 2011-11-29 MED ORDER — POTASSIUM CHLORIDE CRYS ER 20 MEQ PO TBCR
EXTENDED_RELEASE_TABLET | ORAL | Status: AC
  Administered 2011-11-29: 40 meq via ORAL
  Filled 2011-11-29: qty 2

## 2011-11-29 MED ORDER — OFF THE BEAT BOOK
Freq: Once | Status: AC
  Administered 2011-11-29: 09:00:00
  Filled 2011-11-29: qty 1

## 2011-11-29 MED ORDER — LIVING WELL WITH DIABETES BOOK
Freq: Once | Status: AC
  Administered 2011-11-29: 18:00:00
  Filled 2011-11-29: qty 1

## 2011-11-29 MED ORDER — POTASSIUM CHLORIDE CRYS ER 20 MEQ PO TBCR
40.0000 meq | EXTENDED_RELEASE_TABLET | Freq: Once | ORAL | Status: AC
  Administered 2011-11-29: 40 meq via ORAL

## 2011-11-29 MED ORDER — FUROSEMIDE 10 MG/ML IJ SOLN
80.0000 mg | Freq: Two times a day (BID) | INTRAMUSCULAR | Status: DC
  Administered 2011-11-29: 80 mg via INTRAVENOUS
  Filled 2011-11-29 (×2): qty 8

## 2011-11-29 NOTE — Progress Notes (Signed)
Inpatient Diabetes Program Recommendations  AACE/ADA: New Consensus Statement on Inpatient Glycemic Control (2013)  Target Ranges:  Prepandial:   less than 140 mg/dL      Peak postprandial:   less than 180 mg/dL (1-2 hours)      Critically ill patients:  140 - 180 mg/dL   Reason for Visit: Consult - HgbA1C - 16.52%  46 year old male admitted with SOB. No hx of DM.  Recently quit smoking and has gained signif amt of weight.   Results for KWALI, WRINKLE (MRN [id number]) as of 11/29/2011 17:42  Ref. Range 11/28/2011 20:43 11/29/2011 04:25  Glucose Latest Range: 70-99 mg/dL 147 (H) 829 (H)  Results for DEVLON, DOSHER (MRN [id number]) as of 11/29/2011 17:42  Ref. Range 11/27/2011 23:19  Hemoglobin A1C Latest Range: <5.7 % 6.2 (H)    Inpatient Diabetes Program Recommendations Correction (SSI): Add Novolog sensitive tidwc Outpatient Referral: OP Diabetes Education consult for pre-diabetes and obesity Diet: Add CHO-mod med  Note: Long discussion with pt regarding pre-diabetes and importance of making lifestyle changes with diet and exercise.  Pt does not have PCP at present. States he is motivated to make small changes.  Encouraged pt to obtain glucose meter to check blood sugars several times per week and record.  Ordered Living Well With Diabetes book and instructed to view diabetes videos on pt ed channel.  May consider addition of metformin to assist with glycemic control.    Will follow.

## 2011-11-29 NOTE — Progress Notes (Signed)
Cardiology Progress Note Patient Name: William Fischer Date of Encounter: 11/29/2011, 6:12 AM     Subjective  No overnight events. Patient denies chest pain or sob.    Objective   Telemetry: A.Fib 80-110s, ~29beat NSVT  Medications: . antiseptic oral rinse  15 mL Mouth Rinse BID  . aspirin EC  81 mg Oral Daily  . dabigatran  150 mg Oral Q12H  . digoxin  0.125 mg Oral Daily  . furosemide  80 mg Intravenous Q8H  . influenza  inactive virus vaccine  0.5 mL Intramuscular Tomorrow-1000  . [COMPLETED] potassium chloride  40 mEq Oral Once  . [DISCONTINUED] enoxaparin (LOVENOX) injection  40 mg Subcutaneous QHS  . [DISCONTINUED] furosemide  20 mg Intravenous Daily   . sodium chloride 10 mL/hr at 11/27/11 2100  . diltiazem (CARDIZEM) infusion 20 mg/hr (11/29/11 0600)    Physical Exam: Temp:  [97.6 F (36.4 C)-98.7 F (37.1 C)] 98.7 F (37.1 C) (11/12 0500) Pulse Rate:  [43-107] 104  (11/11 1400) Resp:  [20-31] 20  (11/12 0500) BP: (113-145)/(64-110) 133/86 mmHg (11/12 0500) SpO2:  [92 %-94 %] 94 % (11/12 0500) Weight:  [324 lb 4.8 oz (147.1 kg)] 324 lb 4.8 oz (147.1 kg) (11/12 0500)  General: Overweight white male, in no acute distress. Head: Normocephalic, atraumatic, sclera non-icteric, nares are without discharge.  Neck: Supple. Negative for carotid bruits. Difficult to assess for JVD due to habitus. Lungs: Fine bibasilar rales. No wheezes or rhonchi. Breathing is unlabored. Heart: Irregularly irregular S1 S2 without murmurs, rubs, or gallops.  Abdomen: Obese, soft, non-tender, non-distended with normoactive bowel sounds. No rebound/guarding. No obvious abdominal masses. Msk:  Strength and tone appear normal for age. Extremities: No edema. No clubbing or cyanosis. Distal pedal pulses are intact and equal bilaterally. Neuro: Alert and oriented X 3. Moves all extremities spontaneously. Psych:  Responds to questions appropriately with a normal affect.   Intake/Output  Summary (Last 24 hours) at 11/29/11 0612 Last data filed at 11/29/11 0600  Gross per 24 hour  Intake   1591 ml  Output   4175 ml  Net  -2584 ml    Labs:  [facility] 11/29/11 0425 11/28/11 2043 11/28/11 0900 11/27/11 2319  NA 137 138 -- --  K 3.5 3.6 -- --  CL 96 99 -- --  CO2 29 28 -- --  GLUCOSE 139* 178* -- --  BUN 23 23 -- --  CREATININE 1.14 1.12 -- --  CALCIUM 8.8 8.6 -- --  MG -- -- 2.1 2.0   Basename 11/27/11 1420  AST 31  ALT 55*  ALKPHOS 67  BILITOT 0.7  PROT 7.5  ALBUMIN 3.6   Basename 11/27/11 2319 11/27/11 1420  WBC 10.4 10.7*  NEUTROABS -- 7.2  HGB 14.4 15.8  HCT 44.0 47.1  MCV 92.8 90.4  PLT 327 316   Basename 11/28/11 0900 11/28/11 0428 11/27/11 2319 11/27/11 1420  TROPONINI <0.30 <0.30 <0.30 <0.30   Basename 11/27/11 1420  DDIMER 2.16*   Basename 11/27/11 2319  HGBA1C 6.2*   Basename 11/28/11 0628  CHOL 138  HDL 31*  LDLCALC 97  TRIG 50  CHOLHDL 4.5   Basename 11/27/11 2319  TSH 1.790   Radiology/Studies:   11/27/2011 - CT ANGIOGRAPHY CHEST  Findings: The pulmonary arteries are well opacified.  There is no evidence for acute pulmonary embolus.  The heart is enlarged.  There are diffusely slightly enlarged mediastinal and hilar lymph nodes.  Largest nodes are  identified in the mediastinum, measuring 2.2 cm in the prevascular space and 2.3 cm in the right paratracheal region.  There are bilateral pleural effusions, small.  Patchy mosaic changes are identified within the lung bases dependently, most consistent with mild pulmonary edema.  Images of the upper abdomen show a small amount.  Pelvic fluid.  Visualized osseous structures have a normal appearance.  IMPRESSION:  1.  Technically adequate exam showing no evidence for acute pulmonary embolus. 2.  Cardiomegaly and changes of pulmonary edema. 3.  Mediastinal and hilar lymph nodes are favored to be related to pulmonary edema.     11/28/2011 -  PORTABLE CHEST - 1 VIEW  Findings: Enlargement of  cardiac silhouette with pulmonary vascular congestion. Diffuse interstitial infiltrates compatible with pulmonary edema and CHF. No gross pleural effusion or pneumothorax. Bones unremarkable.  IMPRESSION: CHF, unchanged.     11/28/11 - Echo Study Conclusions: - Left ventricle: The cavity size was mildly dilated. Wall thickness was normal. Systolic function was mildly to moderately reduced. The estimated ejection fraction was 40%, in the range of 40% to 45%. Diffuse hypokinesis. The study is not technically sufficient to allow evaluation of LV diastolic function. - Mitral valve: Moderate regurgitation. - Left atrium: The atrium was moderately dilated. - Right ventricle: Systolic function was mildly reduced. - Right atrium: The atrium was moderately dilated. - Pericardium, extracardiac: A small pericardial effusion was identified.    Assessment and Plan  46yom w/ no significant PMHx who presented to [facility] with 5 days of shortness of breath. DDimer was elevated, but CTA chest was negative for PE. He was found to be in a.fib w/ rvr for which he was transferred to [facility]   1. A.fib w/ RVR: Newly diagnosed this admission. Rates 80-110s on Cardizem 20mg /hr and digoxin. TSH normal. CHADS2 score 1 for CHF. On ASA and Pradaxa (has received 2 doses). Plans for TEE/DCCV likely tomorrow afternoon after he has had 5 doses. Stop ASA.  2. Acute Systolic CHF: In the setting of #1. Echo shows EF 40-45%, diffuse hypokinesis, indeterminate LV diastolic function, mod MR, mod R/LAE. On 80mg  IV Lasix Q8H. I/Os (-) 3.6L, Weight down 1lb. Close to euvolemic on exam. Breathing at baseline, but still on O2 and in bed. Crt and BP stable. Cont diuresis, but change Lasix to bid. Consider addition of BB and ACEI.  3. NSVT: Asymptomatic and hemodynamically stable. Consider addition of BB.  4. Glucose Intolerance: Glucose elevated and A1c 6.2. Discussed lifestyle changes and importance for f/u w/  PCP for further monitoring and treatment.  5. Morbid Obesity, BMI 45.5: Encouraged weight loss  6. Diffuse macular rash, resolved  7. ?OSA: patient unsure if he snores, but reports waking up suddenly at night and daytime somnolence. Recommend outpatient sleep study.  Disposition: He has no insurance. Case management to assist with needs. Will need affordable medications at discharge.  Signed, HOPE, JESSICA PA-C  As above, patient seen and examined; patient presents with CHF and atrial fibrillation; continue cardizem and digoxin for rate control; proceed with TEE/DCCV tomorrow (patient will receive 5th dose of pradaxa in AM); once in sinus, dc cardizem and treat with beta blocker and ACEI; repeat echo 3 months after sinus established. Change lasix to 80 BID and follow renal function. DC ASA as patient on pradaxa. Outpt sleep study. Olga Millers 8:28 AM

## 2011-11-30 ENCOUNTER — Inpatient Hospital Stay (HOSPITAL_COMMUNITY): Payer: Medicaid Other | Admitting: Certified Registered"

## 2011-11-30 ENCOUNTER — Encounter (HOSPITAL_COMMUNITY): Payer: Self-pay | Admitting: Certified Registered"

## 2011-11-30 ENCOUNTER — Encounter (HOSPITAL_COMMUNITY)
Admission: EM | Disposition: A | Discharge status: Home / Self Care | Payer: Self-pay | Source: Home / Self Care | Attending: Internal Medicine

## 2011-11-30 ENCOUNTER — Inpatient Hospital Stay (HOSPITAL_COMMUNITY): Payer: Medicaid Other

## 2011-11-30 DIAGNOSIS — I4891 Unspecified atrial fibrillation: Secondary | ICD-10-CM

## 2011-11-30 DIAGNOSIS — I5021 Acute systolic (congestive) heart failure: Secondary | ICD-10-CM

## 2011-11-30 DIAGNOSIS — I509 Heart failure, unspecified: Secondary | ICD-10-CM

## 2011-11-30 HISTORY — PX: CARDIOVERSION: SHX1299

## 2011-11-30 HISTORY — PX: TEE WITHOUT CARDIOVERSION: SHX5443

## 2011-11-30 LAB — BASIC METABOLIC PANEL
BUN: 20 mg/dL (ref 6–23)
CO2: 33 mEq/L — ABNORMAL HIGH (ref 19–32)
Calcium: 8.8 mg/dL (ref 8.4–10.5)
GFR calc non Af Amer: 88 mL/min — ABNORMAL LOW (ref >90)
Glucose, Bld: 137 mg/dL — ABNORMAL HIGH (ref 70–99)

## 2011-11-30 SURGERY — ECHOCARDIOGRAM, TRANSESOPHAGEAL
Anesthesia: Moderate Sedation

## 2011-11-30 MED ORDER — POTASSIUM CHLORIDE CRYS ER 20 MEQ PO TBCR
40.0000 meq | EXTENDED_RELEASE_TABLET | Freq: Once | ORAL | Status: AC
  Administered 2011-11-30: 40 meq via ORAL

## 2011-11-30 MED ORDER — POTASSIUM CHLORIDE CRYS ER 20 MEQ PO TBCR
40.0000 meq | EXTENDED_RELEASE_TABLET | Freq: Once | ORAL | Status: AC
  Administered 2011-11-30: 40 meq via ORAL
  Filled 2011-11-30: qty 2

## 2011-11-30 MED ORDER — BUTAMBEN-TETRACAINE-BENZOCAINE 2-2-14 % EX AERO
INHALATION_SPRAY | CUTANEOUS | Status: DC | PRN
  Administered 2011-11-30: 2 via TOPICAL

## 2011-11-30 MED ORDER — FENTANYL CITRATE 0.05 MG/ML IJ SOLN
INTRAMUSCULAR | Status: AC
  Filled 2011-11-30: qty 2

## 2011-11-30 MED ORDER — POTASSIUM CHLORIDE CRYS ER 20 MEQ PO TBCR
20.0000 meq | EXTENDED_RELEASE_TABLET | Freq: Two times a day (BID) | ORAL | Status: DC
  Administered 2011-11-30 – 2011-12-02 (×5): 20 meq via ORAL
  Filled 2011-11-30 (×3): qty 1
  Filled 2011-11-30: qty 2
  Filled 2011-11-30 (×4): qty 1

## 2011-11-30 MED ORDER — IBUPROFEN 400 MG PO TABS
400.0000 mg | ORAL_TABLET | Freq: Four times a day (QID) | ORAL | Status: DC | PRN
  Administered 2011-12-01: 400 mg via ORAL
  Filled 2011-11-30: qty 1

## 2011-11-30 MED ORDER — DILTIAZEM HCL 100 MG IV SOLR
10.0000 mg/h | INTRAVENOUS | Status: DC
  Administered 2011-11-30 (×2): 10 mg/h via INTRAVENOUS
  Filled 2011-11-30: qty 100

## 2011-11-30 MED ORDER — LIDOCAINE HCL (CARDIAC) 20 MG/ML IV SOLN
INTRAVENOUS | Status: DC | PRN
  Administered 2011-11-30: 70 mg via INTRAVENOUS

## 2011-11-30 MED ORDER — PROPOFOL 10 MG/ML IV BOLUS
INTRAVENOUS | Status: DC | PRN
  Administered 2011-11-30: 120 mg via INTRAVENOUS

## 2011-11-30 MED ORDER — FUROSEMIDE 10 MG/ML IJ SOLN
40.0000 mg | Freq: Once | INTRAMUSCULAR | Status: AC
  Administered 2011-11-30: 40 mg via INTRAVENOUS

## 2011-11-30 MED ORDER — CARVEDILOL 6.25 MG PO TABS
6.2500 mg | ORAL_TABLET | Freq: Two times a day (BID) | ORAL | Status: DC
  Administered 2011-11-30 – 2011-12-03 (×6): 6.25 mg via ORAL
  Filled 2011-11-30 (×9): qty 1

## 2011-11-30 MED ORDER — MENTHOL 3 MG MT LOZG
1.0000 | LOZENGE | OROMUCOSAL | Status: DC | PRN
  Administered 2011-11-30 – 2011-12-01 (×2): 3 mg via ORAL
  Filled 2011-11-30: qty 9

## 2011-11-30 MED ORDER — MIDAZOLAM HCL 5 MG/ML IJ SOLN
INTRAMUSCULAR | Status: AC
  Filled 2011-11-30: qty 2

## 2011-11-30 MED ORDER — SODIUM CHLORIDE 0.9 % IV SOLN
INTRAVENOUS | Status: DC | PRN
  Administered 2011-11-30: 15:00:00 via INTRAVENOUS

## 2011-11-30 MED ORDER — FENTANYL CITRATE 0.05 MG/ML IJ SOLN
INTRAMUSCULAR | Status: DC | PRN
  Administered 2011-11-30: 25 ug via INTRAVENOUS
  Administered 2011-11-30: 50 ug via INTRAVENOUS

## 2011-11-30 MED ORDER — FUROSEMIDE 40 MG PO TABS
40.0000 mg | ORAL_TABLET | Freq: Two times a day (BID) | ORAL | Status: DC
  Administered 2011-11-30 – 2011-12-03 (×6): 40 mg via ORAL
  Filled 2011-11-30 (×9): qty 1

## 2011-11-30 MED ORDER — MIDAZOLAM HCL 10 MG/2ML IJ SOLN
INTRAMUSCULAR | Status: DC | PRN
  Administered 2011-11-30 (×2): 2 mg via INTRAVENOUS
  Administered 2011-11-30: 1 mg via INTRAVENOUS

## 2011-11-30 NOTE — Interval H&P Note (Signed)
History and Physical Interval Note:  11/30/2011 2:26 PM  [name]  has presented today for surgery, with the diagnosis of A flutter  The various methods of treatment have been discussed with the patient and family. After consideration of risks, benefits and other options for treatment, the patient has consented to  Procedure(s) (LRB) with comments: TRANSESOPHAGEAL ECHOCARDIOGRAM (TEE) (N/A) CARDIOVERSION (N/A) as a surgical intervention .  The patient's history has been reviewed, patient examined, no change in status, stable for surgery.  I have reviewed the patient's chart and labs.  Questions were answered to the patient's satisfaction.     Elyn Aquas.

## 2011-11-30 NOTE — Progress Notes (Signed)
Cardiology PM Note:   Pt underwent TEE guided cardioversion today with no evidence of LA thrombus. DCCV x 3 unsuccessful. He remains in atrial fibrillation today and is rate controlled. Will continue diltiazem drip and po Coreg and digoxin today. He is agreeable to staying overnight for EP consult in am. Will ask Dr. Johney Frame for options including medical therapy, consideration for ablation given his age. Continue Pradaxa for anticoagulation. Will continue IV Lasix tonight for diuresis.   William Fischer 5:48 PM 11/30/2011

## 2011-11-30 NOTE — Anesthesia Preprocedure Evaluation (Signed)
Anesthesia Evaluation  Patient identified by MRN, date of birth, ID band Patient awake    Reviewed: Allergy & Precautions, H&P , NPO status , Patient's Chart, lab work & pertinent test results  History of Anesthesia Complications Negative for: history of anesthetic complications  Airway       Dental   Pulmonary former smoker,  bilat pulmonary infiltrates bronchitis          Cardiovascular + dysrhythmias Atrial Fibrillation  Ef 35 %-40%   Neuro/Psych  Headaches, PSYCHIATRIC DISORDERS Depression    GI/Hepatic   Endo/Other  Morbid obesity  Renal/GU      Musculoskeletal   Abdominal   Peds  Hematology   Anesthesia Other Findings   Reproductive/Obstetrics                           Anesthesia Physical Anesthesia Plan  ASA: III  Anesthesia Plan: General   Post-op Pain Management:    Induction: Intravenous  Airway Management Planned: Mask  Additional Equipment:   Intra-op Plan:   Post-operative Plan:   Informed Consent: I have reviewed the patients History and Physical, chart, labs and discussed the procedure including the risks, benefits and alternatives for the proposed anesthesia with the patient or authorized representative who has indicated his/her understanding and acceptance.     Plan Discussed with: CRNA, Anesthesiologist and Surgeon  Anesthesia Plan Comments:         Anesthesia Quick Evaluation

## 2011-11-30 NOTE — Transfer of Care (Signed)
Immediate Anesthesia Transfer of Care Note  Patient: William Fischer  Procedure(s) Performed: Procedure(s) (LRB) with comments: TRANSESOPHAGEAL ECHOCARDIOGRAM (TEE) (N/A) CARDIOVERSION (N/A)  Patient Location: PACU and Endoscopy Unit  Anesthesia Type:General  Level of Consciousness: awake, alert , oriented and patient cooperative  Airway & Oxygen Therapy: Patient Spontanous Breathing and Patient connected to face mask oxygen  Post-op Assessment: Report given to PACU RN, Post -op Vital signs reviewed and stable and Patient moving all extremities  Post vital signs: Reviewed and stable  Complications: No apparent anesthesia complications

## 2011-11-30 NOTE — Progress Notes (Signed)
*  PRELIMINARY RESULTS* Echocardiogram Echocardiogram Transesophageal has been performed.  William Fischer 11/30/2011, 3:16 PM

## 2011-11-30 NOTE — Progress Notes (Signed)
  SUBJECTIVE: No complaints this am. No events. No chest pain or SOB.   BP 118/64  Pulse 82  Temp 97.7 F (36.5 C) (Oral)  Resp 18  Ht 5' 11" (1.803 m)  Wt 322 lb 15.6 oz (146.5 kg)  BMI 45.05 kg/m2  SpO2 91%  Intake/Output Summary (Last 24 hours) at 11/30/11 0654 Last data filed at 11/30/11 0600  Gross per 24 hour  Intake   1180 ml  Output   3302 ml  Net  -2122 ml    PHYSICAL EXAM General: Well developed, well nourished, in no acute distress. Alert and oriented x 3.  Psych:  Good affect, responds appropriately Neck: No JVD. No masses noted.  Lungs: Clear bilaterally with no wheezes or rhonci noted.  Heart: Irregular with no murmurs noted. Abdomen: Bowel sounds are present. Soft, non-tender.  Extremities: No lower extremity edema.   LABS: Basic Metabolic Panel:  Basename 11/30/11 0448 11/29/11 0425 11/28/11 0900 11/27/11 2319  NA 136 137 -- --  K 3.5 3.5 -- --  CL 94* 96 -- --  CO2 33* 29 -- --  GLUCOSE 137* 139* -- --  BUN 20 23 -- --  CREATININE 1.02 1.14 -- --  CALCIUM 8.8 8.8 -- --  MG -- -- 2.1 2.0  PHOS -- -- -- --   CBC:  Basename 11/27/11 2319 11/27/11 1420  WBC 10.4 10.7*  NEUTROABS -- 7.2  HGB 14.4 15.8  HCT 44.0 47.1  MCV 92.8 90.4  PLT 327 316   Cardiac Enzymes:  Basename 11/28/11 0900 11/28/11 0428 11/27/11 2319  CKTOTAL -- -- --  CKMB -- -- --  CKMBINDEX -- -- --  TROPONINI <0.30 <0.30 <0.30   Fasting Lipid Panel:  Basename 11/28/11 0628  CHOL 138  HDL 31*  LDLCALC 97  TRIG 50  CHOLHDL 4.5  LDLDIRECT --    Current Meds:    . antiseptic oral rinse  15 mL Mouth Rinse BID  . dabigatran  150 mg Oral Q12H  . digoxin  0.125 mg Oral Daily  . furosemide  80 mg Intravenous BID  . [EXPIRED] influenza  inactive virus vaccine  0.5 mL Intramuscular Tomorrow-1000  . [COMPLETED] living well with diabetes book   Does not apply Once  . [COMPLETED] off the beat book   Does not apply Once  . [COMPLETED] potassium chloride  40 mEq Oral  Once  . [DISCONTINUED] aspirin EC  81 mg Oral Daily  . [DISCONTINUED] furosemide  80 mg Intravenous Q8H     ASSESSMENT AND PLAN: 46yom w/ no significant PMHx who presented to [facility] with 5 days of shortness of breath. DDimer was elevated, but CTA chest was negative for PE. He was found to be in a.fib w/ rvr for which he was transferred to [facility].   1. A.fib w/ RVR: Newly diagnosed this admission. Rates 80s on Cardizem 20mg/hr and digoxin. TSH normal. CHADS2 score 1 for CHF. On ASA and Pradaxa (has received 4 doses). Plans for TEE/DCCV this afternoon after he has had 5 doses of Pradaxa.   2. Acute Systolic CHF: In the setting of #1. Echo shows EF 40-45%, diffuse hypokinesis, indeterminate LV diastolic function, mod MR, mod R/LAE. He has diuresed well. He appears to euvolemic on exam. Will change to po Lasix today. Wean O2 today.  Crt and BP stable. Will add BB and ACEI after DCCV when Cardizem is stopped.    3. NSVT: Asymptomatic and hemodynamically stable. Will   add BB today.   4. Glucose Intolerance: Glucose elevated and A1c 6.2. Discussed lifestyle changes and importance for f/u w/ PCP for further monitoring and treatment.   5. Morbid Obesity: BMI 45.5: Encouraged weight loss   6. ?OSA: patient unsure if he snores, but reports waking up suddenly at night and daytime somnolence. Recommend outpatient sleep study.  7. Hypokalemia: Will replace potassium this am.   8. Dispo: Pending DCCV. He could potentially be discharged home later today if he does well.   William Fischer  11/13/20136:54 AM  

## 2011-11-30 NOTE — H&P (View-Only) (Signed)
SUBJECTIVE: No complaints this am. No events. No chest pain or SOB.   BP 118/64  Pulse 82  Temp 97.7 F (36.5 C) (Oral)  Resp 18  Ht 5\' 11"  (1.803 m)  Wt 322 lb 15.6 oz (146.5 kg)  BMI 45.05 kg/m2  SpO2 91%  Intake/Output Summary (Last 24 hours) at 11/30/11 0654 Last data filed at 11/30/11 0600  Gross per 24 hour  Intake   1180 ml  Output   3302 ml  Net  -2122 ml    PHYSICAL EXAM General: Well developed, well nourished, in no acute distress. Alert and oriented x 3.  Psych:  Good affect, responds appropriately Neck: No JVD. No masses noted.  Lungs: Clear bilaterally with no wheezes or rhonci noted.  Heart: Irregular with no murmurs noted. Abdomen: Bowel sounds are present. Soft, non-tender.  Extremities: No lower extremity edema.   LABS: Basic Metabolic Panel:  Basename 11/30/11 0448 11/29/11 0425 11/28/11 0900 11/27/11 2319  NA 136 137 -- --  K 3.5 3.5 -- --  CL 94* 96 -- --  CO2 33* 29 -- --  GLUCOSE 137* 139* -- --  BUN 20 23 -- --  CREATININE 1.02 1.14 -- --  CALCIUM 8.8 8.8 -- --  MG -- -- 2.1 2.0  PHOS -- -- -- --   CBC:  Basename 11/27/11 2319 11/27/11 1420  WBC 10.4 10.7*  NEUTROABS -- 7.2  HGB 14.4 15.8  HCT 44.0 47.1  MCV 92.8 90.4  PLT 327 316   Cardiac Enzymes:  Basename 11/28/11 0900 11/28/11 0428 11/27/11 2319  CKTOTAL -- -- --  CKMB -- -- --  CKMBINDEX -- -- --  TROPONINI <0.30 <0.30 <0.30   Fasting Lipid Panel:  Basename 11/28/11 0628  CHOL 138  HDL 31*  LDLCALC 97  TRIG 50  CHOLHDL 4.5  LDLDIRECT --    Current Meds:    . antiseptic oral rinse  15 mL Mouth Rinse BID  . dabigatran  150 mg Oral Q12H  . digoxin  0.125 mg Oral Daily  . furosemide  80 mg Intravenous BID  . [EXPIRED] influenza  inactive virus vaccine  0.5 mL Intramuscular Tomorrow-1000  . [COMPLETED] living well with diabetes book   Does not apply Once  . [COMPLETED] off the beat book   Does not apply Once  . [COMPLETED] potassium chloride  40 mEq Oral  Once  . [DISCONTINUED] aspirin EC  81 mg Oral Daily  . [DISCONTINUED] furosemide  80 mg Intravenous Q8H     ASSESSMENT AND PLAN: 46yom w/ no significant PMHx who presented to [facility] with 5 days of shortness of breath. DDimer was elevated, but CTA chest was negative for PE. He was found to be in a.fib w/ rvr for which he was transferred to [facility].   1. A.fib w/ RVR: Newly diagnosed this admission. Rates 80s on Cardizem 20mg /hr and digoxin. TSH normal. CHADS2 score 1 for CHF. On ASA and Pradaxa (has received 4 doses). Plans for TEE/DCCV this afternoon after he has had 5 doses of Pradaxa.   2. Acute Systolic CHF: In the setting of #1. Echo shows EF 40-45%, diffuse hypokinesis, indeterminate LV diastolic function, mod MR, mod R/LAE. He has diuresed well. He appears to euvolemic on exam. Will change to po Lasix today. Wean O2 today.  Crt and BP stable. Will add BB and ACEI after DCCV when Cardizem is stopped.    3. NSVT: Asymptomatic and hemodynamically stable. Will  add BB today.   4. Glucose Intolerance: Glucose elevated and A1c 6.2. Discussed lifestyle changes and importance for f/u w/ PCP for further monitoring and treatment.   5. Morbid Obesity: BMI 45.5: Encouraged weight loss   6. ?OSA: patient unsure if he snores, but reports waking up suddenly at night and daytime somnolence. Recommend outpatient sleep study.  7. Hypokalemia: Will replace potassium this am.   8. Dispo: Pending DCCV. He could potentially be discharged home later today if he does well.   Ronav Furney  11/13/20136:54 AM

## 2011-11-30 NOTE — CV Procedure (Signed)
    Transesophageal Echocardiogram Note  William Fischer [id number] Jan 11, 1966  Procedure: Transesophageal Echocardiogram Indications: atrial fibrillation  Procedure Details Consent: Obtained Time Out: Verified patient identification, verified procedure, site/side was marked, verified correct patient position, special equipment/implants available, Radiology Safety Procedures followed,  medications/allergies/relevent history reviewed, required imaging and test results available.  Performed  Medications: Fentanyl: 75 mcg IV Versed: 5 mg IV  Left Ventrical:  Moderate LV dysfunction - EF 30-40%  Mitral Valve: mitral valve  Aortic Valve: normal AV  Tricuspid Valve: normal TV  Pulmonic Valve: normal   Left Atrium/ Left atrial appendage: no thrombi  Atrial septum: no ASD by color flow  Aorta: not seen well.     Complications: No apparent complications.  We used light sedation with the TEE.  The patient showed signs of sleep apnea.  His sats dropped to 90% on 4 liters [place] after some sedation  Patient did tolerate procedure well. Will proceed with cardioversion.     Cardioversion Note  William Fischer [id number] 1965/05/04  Procedure: DC Cardioversion Indications: atrial fibrillation, no LAA thrombus by TEE  Procedure Details Consent: Obtained Time Out: Verified patient identification, verified procedure, site/side was marked, verified correct patient position, special equipment/implants available, Radiology Safety Procedures followed,  medications/allergies/relevent history reviewed, required imaging and test results available.  Performed  The patient has been on adequate anticoagulation.  The patient received IV Lidocaine 70 mg and a total of Propofol 120  for sedation.  Synchronous cardioversion was performed at 200  Joules on 3 occasions.  He failed to convert to NSR.  The cardioversion was unsuccessful.    Complications: No apparent complications Patient did  tolerate procedure well.   Vesta Mixer, Montez Hageman., MD, [facility] 11/30/2011, 2:47 PM

## 2011-12-01 ENCOUNTER — Encounter (HOSPITAL_COMMUNITY): Payer: Self-pay | Admitting: Cardiovascular Disease

## 2011-12-01 LAB — BASIC METABOLIC PANEL
CO2: 31 mEq/L (ref 19–32)
Calcium: 8.8 mg/dL (ref 8.4–10.5)
Calcium: 9 mg/dL (ref 8.4–10.5)
Creatinine, Ser: 0.9 mg/dL (ref 0.50–1.35)
Creatinine, Ser: 0.92 mg/dL (ref 0.50–1.35)
GFR calc non Af Amer: >90 mL/min (ref >90)
GFR calc non Af Amer: >90 mL/min (ref >90)
Glucose, Bld: 110 mg/dL — ABNORMAL HIGH (ref 70–99)
Sodium: 134 mEq/L — ABNORMAL LOW (ref 135–145)
Sodium: 136 mEq/L (ref 135–145)

## 2011-12-01 MED ORDER — DOFETILIDE 500 MCG PO CAPS: 500.0000 ug | ORAL_CAPSULE | Freq: Two times a day (BID) | ORAL | Status: DC

## 2011-12-01 MED ORDER — SODIUM CHLORIDE 0.9 % IJ SOLN
3.0000 mL | Freq: Two times a day (BID) | INTRAMUSCULAR | Status: DC
  Administered 2011-12-01 (×2): 3 mL via INTRAVENOUS

## 2011-12-01 MED ORDER — PANTOPRAZOLE SODIUM 40 MG PO TBEC
40.0000 mg | DELAYED_RELEASE_TABLET | Freq: Every day | ORAL | Status: DC
  Administered 2011-12-01 – 2011-12-04 (×4): 40 mg via ORAL
  Filled 2011-12-01 (×4): qty 1

## 2011-12-01 MED ORDER — POTASSIUM CHLORIDE CRYS ER 20 MEQ PO TBCR
40.0000 meq | EXTENDED_RELEASE_TABLET | ORAL | Status: AC
  Administered 2011-12-01: 40 meq via ORAL
  Filled 2011-12-01: qty 2

## 2011-12-01 MED ORDER — DILTIAZEM HCL 100 MG IV SOLR
10.0000 mg/h | INTRAVENOUS | Status: AC
  Administered 2011-12-01: 10 mg/h via INTRAVENOUS
  Filled 2011-12-01: qty 100

## 2011-12-01 MED ORDER — SODIUM CHLORIDE 0.9 % IV SOLN: 250.0000 mL | INTRAVENOUS | Status: DC | PRN

## 2011-12-01 MED ORDER — DILTIAZEM HCL ER COATED BEADS 240 MG PO CP24
240.0000 mg | ORAL_CAPSULE | Freq: Every day | ORAL | Status: DC
  Administered 2011-12-01 – 2011-12-02 (×2): 240 mg via ORAL
  Filled 2011-12-01 (×3): qty 1

## 2011-12-01 MED ORDER — SOTALOL HCL 80 MG PO TABS
160.0000 mg | ORAL_TABLET | Freq: Two times a day (BID) | ORAL | Status: DC
  Administered 2011-12-01 – 2011-12-03 (×4): 160 mg via ORAL
  Filled 2011-12-01 (×6): qty 2

## 2011-12-01 MED ORDER — POTASSIUM CHLORIDE CRYS ER 20 MEQ PO TBCR
40.0000 meq | EXTENDED_RELEASE_TABLET | ORAL | Status: AC
  Administered 2011-12-01: 40 meq via ORAL

## 2011-12-01 MED ORDER — ALUM & MAG HYDROXIDE-SIMETH 200-200-20 MG/5ML PO SUSP
30.0000 mL | Freq: Four times a day (QID) | ORAL | Status: DC | PRN
  Administered 2011-12-01: 30 mL via ORAL
  Filled 2011-12-01: qty 30

## 2011-12-01 MED ORDER — POTASSIUM CHLORIDE CRYS ER 20 MEQ PO TBCR
EXTENDED_RELEASE_TABLET | ORAL | Status: AC
  Filled 2011-12-01: qty 3

## 2011-12-01 MED ORDER — ZOLPIDEM TARTRATE 5 MG PO TABS
5.0000 mg | ORAL_TABLET | Freq: Every evening | ORAL | Status: DC | PRN
  Administered 2011-12-01 – 2011-12-02 (×3): 5 mg via ORAL
  Filled 2011-12-01 (×3): qty 1

## 2011-12-01 MED ORDER — TRIAMCINOLONE ACETONIDE 0.1 % EX OINT
1.0000 "application " | TOPICAL_OINTMENT | Freq: Two times a day (BID) | CUTANEOUS | Status: DC
  Administered 2011-12-01 – 2011-12-04 (×7): 1 via TOPICAL
  Filled 2011-12-01: qty 15

## 2011-12-01 MED ORDER — SODIUM CHLORIDE 0.9 % IJ SOLN: 3.0000 mL | INTRAMUSCULAR | Status: DC | PRN

## 2011-12-01 NOTE — Consult Note (Addendum)
ELECTROPHYSIOLOGY CONSULT NOTE  Patient ID: William Fischer MRN: [id number], DOB/AGE: 02-08-1965   Admit date: 11/27/2011 Date of Consult: 12/01/2011  Primary Physician: Reuel Derby, MD Primary Cardiologist: New to [facility] Reason for Consultation: Atrial fibrillation  History of Present Illness William Fischer is a pleasant 46 year old gentleman with no prior cardiac history who was admitted on 11/27/2011 with rapid atrial fibrillation and acute systolic HF. William Fischer reports he was in his usual state of health until 5 days prior to admission. He began noticing DOE which was gradually worsening over 5 days. He also reports chest "tightness" and "fluttering" palpitations, LE swelling and orthopnea. On the night prior to admission, he experienced PND and profound dizziness which was limiting his ability to stand, prompting him to see medical attention. He was admitted and started on IV diltiazem in addition to Pradaxa for embolic prophylaxis. He was also given IV furosemide for diuresis. He was then scheduled for TEE-guided DCCV. His TEE ruled out LA thrombus and DCCV was attempted without success x 3. Dr. Johney Frame has been asked to see him for EP recommendations.  William Fischer specifically denies any history of CAD, CHF, valvular heart disease, rheumatic fever or thyroid dysfunction. He denies excessive caffeine intake. He denies alcohol or illicit drug use. He denies recent illness, fever or chills.  Past Medical History Past Medical History  Diagnosis Date  . Chicken pox as a child  . Migraine 06/29/2011  . Bronchitis 06/29/2011  . Obesity 06/29/2011  . Depression with anxiety 06/29/2011    Past Surgical History Past Surgical History  Procedure Date  . Tee without cardioversion 11/30/2011    Procedure: TRANSESOPHAGEAL ECHOCARDIOGRAM (TEE);  Surgeon: Vesta Mixer, MD;  Location: [facility] ENDOSCOPY;  Service: Cardiovascular;  Laterality: N/A;  . Cardioversion 11/30/2011    Procedure:  CARDIOVERSION;  Surgeon: Vesta Mixer, MD;  Location: [facility] ENDOSCOPY;  Service: Cardiovascular;  Laterality: N/A;     Allergies/Intolerances Allergies  Allergen Reactions  . Contrast Media (Iodinated Diagnostic Agents)     a diffuse macular rash after CTA chest     Inpatient Medications . antiseptic oral rinse  15 mL Mouth Rinse BID  . carvedilol  6.25 mg Oral BID WC  . dabigatran  150 mg Oral Q12H  . digoxin  0.125 mg Oral Daily  . [COMPLETED] furosemide  40 mg Intravenous Once  . furosemide  40 mg Oral BID  . potassium chloride  20 mEq Oral BID  . [COMPLETED] potassium chloride  40 mEq Oral Once   . sodium chloride 500 mL (11/30/11 1329)  . diltiazem (CARDIZEM) infusion 10 mg/hr (11/30/11 2340)  . [DISCONTINUED] diltiazem (CARDIZEM) infusion 20 mg/hr (11/30/11 0806)   Family History Family History  Problem Relation Age of Onset  . Hypertension Mother   . Hypertension Father   . Aneurysm Father   . Alcohol abuse Father   . Cancer Maternal Grandmother     brain  . Diabetes Maternal Grandfather     type 2  . Parkinsonism Maternal Grandfather   . Cancer Paternal Grandmother     breast  . Diabetes Paternal Grandmother   . Alcohol abuse Paternal Grandfather      Social History Social History  . Marital Status: Divorced   Social History Main Topics  . Smoking status: Former Smoker -- 1.0 packs/day for 20 years    Types: Cigarettes    Quit date: 01/18/2000  . Smokeless tobacco: Never Used  . Alcohol Use: No  .  Drug Use: No   Review of Systems General: No chills, fever, night sweats or weight changes  Cardiovascular:  +chest pain +dyspnea on exertion +edema +orthopnea +palpitations +paroxysmal nocturnal dyspnea Dermatological: +rash after contrast media; No lesions or masses Respiratory: No cough Urologic: No hematuria, dysuria Abdominal: No nausea, vomiting, diarrhea, bright red blood per rectum, melena, or hematemesis Neurologic: No visual changes, weakness,  changes in mental status All other systems reviewed and are otherwise negative except as noted above.  Physical Exam Blood pressure 129/75, pulse 82, temperature 98 F (36.7 C), temperature source Oral, resp. rate 28, height 5\' 11"  (1.803 m), weight 322 lb 1.5 oz (146.1 kg), SpO2 97.00%.  General: Well developed, obese 46 year old male in no acute distress. HEENT: Normocephalic, atraumatic. EOMs intact. Sclera nonicteric. Oropharynx clear.  Neck: Supple without bruits. No JVD. Lungs: Respirations regular and unlabored, CTA bilaterally. No wheezes, rales or rhonchi. Heart: Irregularly irregular. S1, S2 present. No murmurs, rub, S3 or S4. Abdomen: Soft, non-tender, non-distended. BS present x 4 quadrants. No hepatosplenomegaly.  Extremities: No clubbing, cyanosis or edema. DP/PT/Radials 2+ and equal bilaterally. Psych: Normal affect. Neuro: Alert and oriented X 3. Moves all extremities spontaneously. Musculoskeletal: No kyphosis. Skin: Intact. Warm and dry. No rashes or petechiae in exposed areas.   Labs  Basename 11/28/11 0900  CKTOTAL --  CKMB --  TROPONINI <0.30   Lab Results  Component Value Date   WBC 10.4 11/27/2011   HGB 14.4 11/27/2011   HCT 44.0 11/27/2011   MCV 92.8 11/27/2011   PLT 327 11/27/2011    Lab 12/01/11 0440 11/27/11 1420  NA 134* --  K 3.8 --  CL 96 --  CO2 31 --  BUN 20 --  CREATININE 0.90 --  CALCIUM 8.8 --  PROT -- 7.5  BILITOT -- 0.7  ALKPHOS -- 67  ALT -- 55*  AST -- 31  GLUCOSE 110* --    Lab Results  Component Value Date   CHOL 138 11/28/2011   HDL 31* 11/28/2011   LDLCALC 97 11/28/2011   TRIG 50 11/28/2011   Lab Results  Component Value Date   DDIMER 2.16* 11/27/2011    11/28/2011 - Mg level 2.0 11/28/2011 - TSH 1.790   Radiology/Studies Ct Angio Chest Pe W/cm &/or Wo Cm  11/27/2011  *RADIOLOGY REPORT*  Clinical Data: Elevated D-dimer.  Rule out pulmonary embolus. Shortness of breath.  Increased heart rate.  CT ANGIOGRAPHY  CHEST  Technique:  Multidetector CT imaging of the chest using the standard protocol during bolus administration of intravenous contrast. Multiplanar reconstructed images including MIPs were obtained and reviewed to evaluate the vascular anatomy.  Contrast: OMNIPAQUE IOHEXOL 350 MG/ML SOLN  Comparison: Chest x-ray 11/27/2011  Findings: The pulmonary arteries are well opacified.  There is no evidence for acute pulmonary embolus.  The heart is enlarged.  There are diffusely slightly enlarged mediastinal and hilar lymph nodes.  Largest nodes are identified in the mediastinum, measuring 2.2 cm in the prevascular space and 2.3 cm in the right paratracheal region.  There are bilateral pleural effusions, small.  Patchy mosaic changes are identified within the lung bases dependently, most consistent with mild pulmonary edema.  Images of the upper abdomen show a small amount.  Pelvic fluid.  Visualized osseous structures have a normal appearance.  IMPRESSION:  1.  Technically adequate exam showing no evidence for acute pulmonary embolus. 2.  Cardiomegaly and changes of pulmonary edema. 3.  Mediastinal and hilar lymph nodes are favored to  be related to pulmonary edema.   Original Report Authenticated By: Norva Pavlov, M.D.    Dg Chest Port 1 View  11/30/2011  *RADIOLOGY REPORT*  Clinical Data: Shortness of breath  PORTABLE CHEST - 1 VIEW  Comparison: Portable chest x-ray of 11/28/2011  Findings: There is focal parenchymal opacity in the right mid lung. This would be unusual for edema and therefore pneumonia is the primary consideration.  Cardiomegaly is stable.  No definite effusion is seen.  IMPRESSION: Parenchymal opacity in the right mid lung suspicious for pneumonia. Stable cardiomegaly.   Original Report Authenticated By: Dwyane Dee, M.D.    Dg Chest Port 1 View  11/27/2011  *RADIOLOGY REPORT*  Clinical Data: Shortness of breath.  PORTABLE CHEST - 1 VIEW  Comparison: None.  Findings: There is evidence of  moderate CHF and associated cardiomegaly.  No significant pleural effusions.  IMPRESSION: CHF and cardiomegaly.   Original Report Authenticated By: Irish Lack, M.D.     Transesophageal Echocardiogram 11/30/2011 Study Conclusions - Left ventricle: Systolic function was moderately reduced. The estimated ejection fraction was in the range of 35% to 40%. - Aortic valve: No evidence of vegetation. - Mitral valve: No evidence of vegetation. - Left atrium: No evidence of thrombus in the atrial cavity or appendage. No evidence of thrombus in the appendage.  Transthoracic Echocardiogram 11/28/2011 Study Conclusions - Left ventricle: The cavity size was mildly dilated. Wall thickness was normal. Systolic function was mildly to moderately reduced. The estimated ejection fraction was 40%, in the range of 40% to 45%. Diffuse hypokinesis. The study is not technically sufficient to allow evaluation of LV diastolic function. - Mitral valve: Moderate regurgitation. - Left atrium: The atrium was moderately dilated. LA dimension 56 mm. - Right ventricle: Systolic function was mildly reduced. - Right atrium: The atrium was moderately dilated. - Pericardium, extracardiac: A small pericardial effusion was identified.   12-lead ECG this AM shows rate controlled atrial fibrillation at 77 bpm; manual QTc 430 msec, this was reviewed by Dr. Johney Frame Telemetry shows persistent atrial fibrillation, currently rate controlled  Assessment and Plan 1. Atrial fibrillation - newly diagnosed; persistent despite attempted DCCV yesterday; now rate controlled on IV diltiazem at 10cc/hour, PO carvedilol and PO digoxin; will transition diltiazem to PO now; reviewed treatment options for AFib with him today including rate control vs rhythm control vs RF ablation; Dr. Johney Frame has recommended AAD therapy.  Signed, Rick Duff, PA-C 12/01/2011, 8:31 AM  I have seen, examined the patient, and reviewed the above assessment and  plan.  Changes to above are made where necessary.  He has severe LA enlargement and probably longstanding persistent afib.  He has failed cardioversion.  Given decreased EF, I would like to pursue sinus rhythm.  His options at this time are sotalol and tikosyn.  He has no insurance coverage and does not feel that he can afford tikosyn.  He would prefer sotalol which is on the [employer] $4 plan.  I will  Therefore initiate sotalol at this time.  He has morbidly obese and at risk for sleep apnea.  He will need an outpatient sleep study arranged.  Co Sign: Hillis Range, MD 12/01/2011 3:08 PM

## 2011-12-01 NOTE — Progress Notes (Addendum)
Pharmacy Consult for Dofetilide (Tikosyn) Iniation  Admit Complaint: 46 y.o. male admitted 11/27/2011 with atrial fibrillation to be initiated on dofetilide.   Assessment:  Patient Exclusion Criteria: If any screening criteria checked as "Yes", then  patient  should NOT receive dofetilide until criteria item is corrected. If "Yes" please indicate correction plan.  YES  NO Patient  Exclusion Criteria Correction Plan  [x]  []  Baseline QTc interval is greater than or equal to 440 msec. IF above YES box checked dofetilide contraindicated unless patient has ICD; then may proceed if QTc 500-550 msec or with known ventricular conduction abnormalities may proceed with QTc 550-600 msec. QTc =  488   Will recheck before tonight's dose given  []  [x]  Magnesium level is less than 1.8 mEq/l : Last magnesium:  Lab Results  Component Value Date   MG 2.1 11/28/2011       Will check with BMET for more recent lab  [x]  []  Potassium level is less than 4 mEq/l : Last potassium:  Lab Results  Component Value Date   K 3.8 12/01/2011       Giving 60 mEq po Kcl and rechecking BMET  []  [x]  Patient is known or suspected to have a digoxin level greater than 2 ng/ml: No results found for this basename: DIGOXIN    Pt received digoxin 0.125 x 3days. D/c'd today  []  [x]  Creatinine clearance less than 20 ml/min (calculated using Cockcroft-Gault, actual body weight and serum creatinine): Estimated Creatinine Clearance: 153.5 ml/min (by C-G formula based on Cr of 0.9).    [x]  []  Patient has received drugs known to prolong the QT intervals within the last 48 hours(phenothiazines, tricyclics or tetracyclic antidepressants, erythromycin, H-1 antihistamines, cisapride, fluoroquinolones, azithromycin). Drugs not listed above may have an, as yet, undetected potential to prolong the QT interval, updated information on QT prolonging agents is available at this website:QT prolonging agents Benadryl 25mg  given this morning  []   [x]  Patient received a dose of hydrochlorothiazide (Oretic) alone or in any combination including triamterene (Dyazide, Maxzide) in the last 48 hours.   []  [x]  Patient received a medication known to increase dofetilide plasma concentrations prior to initial dofetilide dose:    Trimethoprim (Primsol, Proloprim) in the last 36 hours   Verapamil (Calan, Verelan) in the last 36 hours or a sustained release dose in the last 72 hours   Megestrol (Megace) in the last 5 days    Cimetidine (Tagamet) in the last 6 hours   Ketoconazole (Nizoral) in the last 24 hours   Itraconazole (Sporanox) in the last 48 hours    Prochlorperazine (Compazine) in the last 36 hours    []  [x]  Patient is known to have a history of torsades de pointes; congenital or acquired long QT syndromes.   []  [x]  Patient has received a Class 1 antiarrhythmic with less than 2 half-lives since last dose. (Disopyramide, Quinidine, Procainamide, Lidocaine, Mexiletine, Flecainide, Propafenone)   []  [x]  Patient has received amiodarone therapy in the past 3 months or amiodarone level is greater than 0.3 ng/ml.    Patient has been appropriately anticoagulated with .pradaxa  Ordering provider was confirmed at [email or web address] if they are not listed on the [facility] Authorized Prescribers list.  Goal of Therapy:  Follow renal function, electrolytes, potential drug interactions, and dose adjustment. Provide education and 1 week supply at discharge.  Plan:  -Potassium being repleted. Follow up results of 1130 BMET (K), magnesium and dixoxin level.  Lillia Pauls, PharmD Clinical Pharmacist Pager: [phone] Phone: [phone]  12/01/2011 10:58 AM

## 2011-12-01 NOTE — Progress Notes (Signed)
Pharmacy Consult for Dofetilide (Tikosyn) Iniation  Admit Complaint: 46 y.o. male admitted 11/27/2011 with atrial fibrillation to be initiated on dofetilide.   Assessment:  Patient Exclusion Criteria: If any screening criteria checked as "Yes", then  patient  should NOT receive dofetilide until criteria item is corrected. If "Yes" please indicate correction plan.  YES  NO Patient  Exclusion Criteria Correction Plan  []  [x]  Baseline QTc interval is greater than or equal to 440 msec. IF above YES box checked dofetilide contraindicated unless patient has ICD; then may proceed if QTc 500-550 msec or with known ventricular conduction abnormalities may proceed with QTc 550-600 msec. QTc =  420   []  [x]  Magnesium level is less than 1.8 mEq/l : Last magnesium:  Lab Results  Component Value Date   MG 2.1 12/01/2011         X []  Potassium level is less than 4 mEq/l : Last potassium:  Lab Results  Component Value Date   K 3.9 12/01/2011       Giving 40 meq KCl now and recheck prior to dose  []  [x]  Patient is known or suspected to have a digoxin level greater than 2 ng/ml: Lab Results  Component Value Date   DIGOXIN <0.3* 12/01/2011      []  [x]  Creatinine clearance less than 20 ml/min (calculated usin [] g Cockcroft-Gault, actual body weight and serum creatinine): Estimated Creatinine Clearance: 150.1 ml/min (by C-G formula based on Cr of 0.92).    [x]  []  Patient has received drugs known to prolong the QT intervals within the last 48 hours (phenothiazines, tricyclics or tetracyclic antidepressants, erythromycin, H-1 antihistamines, cisapride) Drugs not listed above may have an, as yet, undetected potential to prolong the QT interval, updated information on QT prolonging agents is available at this website: [employer] Received 1 benadryl dose this AM.   Not expected to cause an issue.  []  [x]  Patient received a dose of  hydrochlorothiazide (Oretic) alone or in any combination including triamterene (Dyazide, Maxzide) in the last 48 hours.   []  [x]  Patient received a medication known to increase dofetilide plasma concentrations prior to initial dofetilide dose:    Trimethoprim (Primsol, Proloprim) in the last 36 hours   Verapamil (Calan, Verelan) in the last 36 hours or a sustained release dose in the last 72 hours   Megestrol (Megace) in the last 5 days    Cimetidine (Tagamet) in the last 6 hours   Ketoconazole (Nizoral) in the last 24 hours   Itraconazole (Sporanox) in the last 48 hours    Prochlorperazine (Compazine) in the last 36 hours    []  [x]  Patient is known to have a history of torsades de pointes; congenital or acquired long QT syndromes.   []  [x]  Patient has received a Class 1 antiarrhythmic with less than 2 half-lives since last dose. (Disopyramide, Quinidine, Procainamide, Lidocaine, Mexiletine, Flecainide, Propafenone)   []  [x]  Patient has received amiodarone therapy in the past 3 months or amiodarone level is greater than 0.3 ng/ml.    Patient has been appropriately anticoagulated with Pradaxa.  Ordering provider was confirmed at [email or web address] if they are not listed on the [facility] Authorized Prescribers list.  Goal of Therapy:  Follow renal function, electrolytes, potential drug interactions, and dose adjustment. Provide education and 1 week supply at discharge.  Plan:  1. F/U BMET at 1800. 2. Pt prefers tikosyn at 8 AM and 8 PM, so will start at that time assuming K >4.   Granville Whitefield C 1:35 PM  12/01/2011   

## 2011-12-02 LAB — BASIC METABOLIC PANEL
CO2: 32 mEq/L (ref 19–32)
Calcium: 9.3 mg/dL (ref 8.4–10.5)
Chloride: 99 mEq/L (ref 96–112)
Glucose, Bld: 110 mg/dL — ABNORMAL HIGH (ref 70–99)
Sodium: 137 mEq/L (ref 135–145)

## 2011-12-02 LAB — MAGNESIUM: Magnesium: 2.2 mg/dL (ref 1.5–2.5)

## 2011-12-02 MED ORDER — SODIUM CHLORIDE 0.9 % IJ SOLN
3.0000 mL | Freq: Two times a day (BID) | INTRAMUSCULAR | Status: DC
  Administered 2011-12-02 – 2011-12-03 (×3): 3 mL via INTRAVENOUS

## 2011-12-02 MED ORDER — FUROSEMIDE 10 MG/ML IJ SOLN
40.0000 mg | Freq: Once | INTRAMUSCULAR | Status: AC
  Administered 2011-12-02: 40 mg via INTRAVENOUS
  Filled 2011-12-02: qty 4

## 2011-12-02 MED ORDER — SODIUM CHLORIDE 0.9 % IJ SOLN: 3.0000 mL | INTRAMUSCULAR | Status: DC | PRN

## 2011-12-02 MED ORDER — SODIUM CHLORIDE 0.9 % IV SOLN: 250.0000 mL | INTRAVENOUS | Status: DC

## 2011-12-02 NOTE — Progress Notes (Signed)
Patient: William Fischer Date of Encounter: 12/02/2011, 12:16 PM Admit date: 11/27/2011     Subjective  Mr. William Fischer denies CP, SOB or palpitations.   Objective  Physical Exam: Vitals: BP 105/62  Pulse 71  Temp 97.9 F (36.6 C) (Oral)  Resp 18  Ht 5\' 11"  (1.803 m)  Wt 319 lb 0.1 oz (144.7 kg)  BMI 44.49 kg/m2  SpO2 95% General: Well developed, obese 46 year old male in no acute distress. Neck: Supple. No JVD. Lungs: Few basilar rales otherwise clear bilaterally to auscultation without wheezes or rhonchi. Breathing is unlabored. Heart: Irregularly irregular S1 S2 without murmur, rub or gallop.  Abdomen: Soft, non-distended. Extremities: No clubbing or cyanosis. 1+ edema.  Distal pedal pulses are 2+ and equal bilaterally. Neuro: Alert and oriented X 3. Moves all extremities spontaneously. No focal deficits.  Intake/Output:  Intake/Output Summary (Last 24 hours) at 12/02/11 1216 Last data filed at 12/02/11 1200  Gross per 24 hour  Intake   1320 ml  Output   1375 ml  Net    -55 ml    Inpatient Medications:     . carvedilol  6.25 mg Oral BID WC  . dabigatran  150 mg Oral Q12H  . diltiazem  240 mg Oral Daily  . furosemide  40 mg Oral BID  . pantoprazole  40 mg Oral Daily  . potassium chloride  20 mEq Oral BID  . [COMPLETED] potassium chloride  40 mEq Oral NOW  . sotalol  160 mg Oral Q12H  . triamcinolone ointment  1 application Topical BID  . [DISCONTINUED] antiseptic oral rinse  15 mL Mouth Rinse BID  . [DISCONTINUED] dofetilide  500 mcg Oral Q12H  . [DISCONTINUED] sodium chloride  3 mL Intravenous Q12H    Labs:  Basename 12/02/11 0615 12/01/11 1120  NA 137 136  K 4.2 3.9  CL 99 98  CO2 32 30  GLUCOSE 110* 127*  BUN 18 20  CREATININE 1.11 0.92  CALCIUM 9.3 9.0  MG 2.2 2.1  PHOS -- --    Radiology/Studies: Ct Angio Chest Pe W/cm &/or Wo Cm  11/27/2011  *RADIOLOGY REPORT*  Clinical Data: Elevated D-dimer.  Rule out pulmonary embolus. Shortness  of breath.  Increased heart rate.  CT ANGIOGRAPHY CHEST  Technique:  Multidetector CT imaging of the chest using the standard protocol during bolus administration of intravenous contrast. Multiplanar reconstructed images including MIPs were obtained and reviewed to evaluate the vascular anatomy.  Contrast: OMNIPAQUE IOHEXOL 350 MG/ML SOLN  Comparison: Chest x-ray 11/27/2011  Findings: The pulmonary arteries are well opacified.  There is no evidence for acute pulmonary embolus.  The heart is enlarged.  There are diffusely slightly enlarged mediastinal and hilar lymph nodes.  Largest nodes are identified in the mediastinum, measuring 2.2 cm in the prevascular space and 2.3 cm in the right paratracheal region.  There are bilateral pleural effusions, small.  Patchy mosaic changes are identified within the lung bases dependently, most consistent with mild pulmonary edema.  Images of the upper abdomen show a small amount.  Pelvic fluid.  Visualized osseous structures have a normal appearance.  IMPRESSION:  1.  Technically adequate exam showing no evidence for acute pulmonary embolus. 2.  Cardiomegaly and changes of pulmonary edema. 3.  Mediastinal and hilar lymph nodes are favored to be related to pulmonary edema.   Original Report Authenticated By: William Fischer, M.D.    Dg Chest Port 1 View  11/30/2011  *RADIOLOGY REPORT*  Clinical Data: Shortness of breath  PORTABLE CHEST - 1 VIEW  Comparison: Portable chest x-ray of 11/28/2011  Findings: There is focal parenchymal opacity in the right mid lung. This would be unusual for edema and therefore pneumonia is the primary consideration.  Cardiomegaly is stable.  No definite effusion is seen.  IMPRESSION: Parenchymal opacity in the right mid lung suspicious for pneumonia. Stable cardiomegaly.   Original Report Authenticated By: William Fischer, M.D.     Transthoracic Echocardiogram 11/28/2011  Study Conclusions - Left ventricle: The cavity size was mildly dilated.  Wall thickness was normal. Systolic function was mildly to moderately reduced. The estimated ejection fraction was 40%, in the range of 40% to 45%. Diffuse hypokinesis. The study is not technically sufficient to allow evaluation of LV diastolic function. - Mitral valve: Moderate regurgitation. - Left atrium: The atrium was moderately dilated. LA dimension 56 mm. - Right ventricle: Systolic function was mildly reduced. - Right atrium: The atrium was moderately dilated. - Pericardium, extracardiac: A small pericardial effusion was identified.  12-lead ECG pending this AM Telemetry shows persistent atrial fibrillation, currently rate controlled     Assessment and Plan  1. Atrial fibrillation - newly diagnosed; unsuccessful attempted DCCV earlier this admit; now rate controlled on diltiazem and carvedilol; initiated on sotalol, follow QTc (ECG pending this AM); plan for repeat DCCV in AM 2. Acute systolic HF - appears still mildly volume overloaded; will give lasix 40 mg IV x1 and continue PO regimen; strict I/Os; low sodium and fluid restrict; continue carvedilol 3. Morbid obesity 4. Probable OSA  Signed, William Fischer, BROOKE PA-C  I have seen, examined the patient, and reviewed the above assessment and plan.  Changes to above are made where necessary.  Pt with likely longstanding persistent afib and a large LA.  He failed cardioversion earlier this admit after presenting with CHF and newly depressed EF in the setting of Afib with RVR.  He will need repeat cardioversion tomorrow am.  He has been adequately anticoagulated with pradaxa since his recent TEE.  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 If he fails cardioversion at this point, then our strategy would become one of rate control and follow-up with me in the office in 3-4 weeks.  He would not be a candidate for PVI given his  refractory afib, but might be a candidate for either a convergent procedure or more traditional MAZE/ mini MAZE once we are able to obtain either [facility] [employer] assistance or a more definitive insurance coverage.  ??would there be any additional resources for him through the CHF clinic?? He also needs an outpatient sleep study once financial coverage can be obtained.   Co Sign: Hillis Range, MD 12/02/2011 9:16 PM

## 2011-12-03 ENCOUNTER — Encounter (HOSPITAL_COMMUNITY)
Admission: EM | Disposition: A | Discharge status: Home / Self Care | Payer: Self-pay | Source: Home / Self Care | Attending: Internal Medicine

## 2011-12-03 ENCOUNTER — Encounter (HOSPITAL_COMMUNITY): Payer: Self-pay | Admitting: Anesthesiology

## 2011-12-03 ENCOUNTER — Inpatient Hospital Stay (HOSPITAL_COMMUNITY): Payer: Medicaid Other | Admitting: Anesthesiology

## 2011-12-03 DIAGNOSIS — I5021 Acute systolic (congestive) heart failure: Secondary | ICD-10-CM

## 2011-12-03 DIAGNOSIS — I4891 Unspecified atrial fibrillation: Secondary | ICD-10-CM

## 2011-12-03 HISTORY — PX: CARDIOVERSION: SHX1299

## 2011-12-03 LAB — BASIC METABOLIC PANEL
CO2: 34 mEq/L — ABNORMAL HIGH (ref 19–32)
Calcium: 9.6 mg/dL (ref 8.4–10.5)
Glucose, Bld: 111 mg/dL — ABNORMAL HIGH (ref 70–99)
Sodium: 140 mEq/L (ref 135–145)

## 2011-12-03 SURGERY — CARDIOVERSION
Anesthesia: General | Wound class: Clean

## 2011-12-03 MED ORDER — LIDOCAINE HCL (CARDIAC) 20 MG/ML IV SOLN
INTRAVENOUS | Status: DC | PRN
  Administered 2011-12-03: 40 mg via INTRAVENOUS

## 2011-12-03 MED ORDER — PROPOFOL 10 MG/ML IV BOLUS
INTRAVENOUS | Status: DC | PRN
  Administered 2011-12-03: 125 mg via INTRAVENOUS

## 2011-12-03 MED ORDER — FUROSEMIDE 40 MG PO TABS
40.0000 mg | ORAL_TABLET | Freq: Every day | ORAL | Status: DC
  Administered 2011-12-04: 40 mg via ORAL
  Filled 2011-12-03: qty 1

## 2011-12-03 MED ORDER — METOPROLOL TARTRATE 50 MG PO TABS
50.0000 mg | ORAL_TABLET | Freq: Two times a day (BID) | ORAL | Status: DC
  Administered 2011-12-03 – 2011-12-04 (×3): 50 mg via ORAL
  Filled 2011-12-03 (×4): qty 1

## 2011-12-03 MED ORDER — SPIRONOLACTONE 12.5 MG HALF TABLET
12.5000 mg | ORAL_TABLET | Freq: Every day | ORAL | Status: DC
  Administered 2011-12-03 – 2011-12-04 (×2): 12.5 mg via ORAL
  Filled 2011-12-03 (×2): qty 1

## 2011-12-03 MED ORDER — SODIUM CHLORIDE 0.9 % IV SOLN
INTRAVENOUS | Status: DC | PRN
  Administered 2011-12-03: 13:00:00 via INTRAVENOUS

## 2011-12-03 NOTE — Progress Notes (Signed)
Patient: William Fischer Date of Encounter: 12/03/2011, 8:39 AM Admit date: 11/27/2011     Subjective  Mr. Lascola denies CP, SOB or palpitations.   Objective  Physical Exam: Vitals: BP 121/70  Pulse 73  Temp 98 F (36.7 C) (Oral)  Resp 20  Ht 5\' 11"  (1.803 m)  Wt 141.6 kg (312 lb 2.7 oz)  BMI 43.54 kg/m2  SpO2 97% General: Well developed, obese 46 year old male in no acute distress. Neck: Supple. No JVD. Lungs: Few basilar rales otherwise clear bilaterally to auscultation without wheezes or rhonchi. Breathing is unlabored. Heart: Irregularly irregular S1 S2 without murmur, rub or gallop.  Abdomen: Soft, non-distended. Extremities: No clubbing or cyanosis. 1+ edema.  Distal pedal pulses are 2+ and equal bilaterally. Neuro: Alert and oriented X 3. Moves all extremities spontaneously. No focal deficits.  Intake/Output:  Intake/Output Summary (Last 24 hours) at 12/03/11 0839 Last data filed at 12/03/11 0600  Gross per 24 hour  Intake    600 ml  Output   3845 ml  Net  -3245 ml    Inpatient Medications:     . carvedilol  6.25 mg Oral BID WC  . dabigatran  150 mg Oral Q12H  . diltiazem  240 mg Oral Daily  . [COMPLETED] furosemide  40 mg Intravenous Once  . furosemide  40 mg Oral BID  . pantoprazole  40 mg Oral Daily  . potassium chloride  20 mEq Oral BID  . sodium chloride  3 mL Intravenous Q12H  . sotalol  160 mg Oral Q12H  . triamcinolone ointment  1 application Topical BID    Labs:  [facility] 12/03/11 0413 12/02/11 0615  NA 140 137  K 3.9 4.2  CL 98 99  CO2 34* 32  GLUCOSE 111* 110*  BUN 20 18  CREATININE 1.08 1.11  CALCIUM 9.6 9.3  MG 2.2 2.2  PHOS -- --    Radiology/Studies: Ct Angio Chest Pe W/cm &/or Wo Cm  11/27/2011  *RADIOLOGY REPORT*  Clinical Data: Elevated D-dimer.  Rule out pulmonary embolus. Shortness of breath.  Increased heart rate.  CT ANGIOGRAPHY CHEST  Technique:  Multidetector CT imaging of the chest using the standard  protocol during bolus administration of intravenous contrast. Multiplanar reconstructed images including MIPs were obtained and reviewed to evaluate the vascular anatomy.  Contrast: OMNIPAQUE IOHEXOL 350 MG/ML SOLN  Comparison: Chest x-ray 11/27/2011  Findings: The pulmonary arteries are well opacified.  There is no evidence for acute pulmonary embolus.  The heart is enlarged.  There are diffusely slightly enlarged mediastinal and hilar lymph nodes.  Largest nodes are identified in the mediastinum, measuring 2.2 cm in the prevascular space and 2.3 cm in the right paratracheal region.  There are bilateral pleural effusions, small.  Patchy mosaic changes are identified within the lung bases dependently, most consistent with mild pulmonary edema.  Images of the upper abdomen show a small amount.  Pelvic fluid.  Visualized osseous structures have a normal appearance.  IMPRESSION:  1.  Technically adequate exam showing no evidence for acute pulmonary embolus. 2.  Cardiomegaly and changes of pulmonary edema. 3.  Mediastinal and hilar lymph nodes are favored to be related to pulmonary edema.   Original Report Authenticated By: Norva Pavlov, M.D.    Dg Chest Port 1 View  11/30/2011  *RADIOLOGY REPORT*  Clinical Data: Shortness of breath  PORTABLE CHEST - 1 VIEW  Comparison: Portable chest x-ray of 11/28/2011  Findings: There is focal  parenchymal opacity in the right mid lung. This would be unusual for edema and therefore pneumonia is the primary consideration.  Cardiomegaly is stable.  No definite effusion is seen.  IMPRESSION: Parenchymal opacity in the right mid lung suspicious for pneumonia. Stable cardiomegaly.   Original Report Authenticated By: Dwyane Dee, M.D.     Transthoracic Echocardiogram 11/28/2011  Study Conclusions - Left ventricle: The cavity size was mildly dilated. Wall thickness was normal. Systolic function was mildly to moderately reduced. The estimated ejection fraction was 40%, in the  range of 40% to 45%. Diffuse hypokinesis. The study is not technically sufficient to allow evaluation of LV diastolic function. - Mitral valve: Moderate regurgitation. - Left atrium: The atrium was moderately dilated. LA dimension 56 mm. - Right ventricle: Systolic function was mildly reduced. - Right atrium: The atrium was moderately dilated. - Pericardium, extracardiac: A small pericardial effusion was identified.  12-lead ECG pending this AM Telemetry shows persistent atrial fibrillation, currently rate controlled     Assessment   1. Atrial fibrillation, new onset    --failed DC-CV x 3 on 11/13. Now on sotalol 2. Acute systolic HF, EF ~ 40%, RV mildly HK. LA 5.6cm (suspect tachy-induced)    --diuresing well weight down 13 pounds 3. Morbid obesity 4. Probable OSA   Plan   Volume status much improved. He remains in AF which is rate controlled. Ideally would want to start ACE-I but BP remains soft. Can drop lasix back to 40 daily. May benefit from low-dose spiro as needed.Continue carvedilol.  Per Dr. Johney Frame, he will need repeat DC-CV today on sotalol for d/c tomorrow due to caretaker responsibilities.  He has been adequately anticoagulated with pradaxa since his recent TEE. If he fails cardioversion at this point, then our strategy would become one of rate control and follow-up with me in the office in 3-4 weeks.  He would not be a candidate for PVI given his refractory afib, but might be a candidate for either a convergent procedure or more traditional MAZE/ mini MAZE once we are able to obtain either [facility] Financial assistance or a more definitive insurance coverage. He also needs an outpatient sleep study once financial coverage can be obtained. Will hold diltiazem prior to DC-CV. Needs ECG this am to assess QT (looks ok on tele).   I discussed plans with him if DC-CV fails but hopefully we will be able to convert him.   Discussed with anesthesia. They have several cases this am.  Hopefully we will get to cardiovert him later this am.   Truman Hayward 8:44 AM

## 2011-12-03 NOTE — Procedures (Signed)
Electrical Cardioversion Procedure Note William Fischer [id number] [birth year]  Procedure: Electrical Cardioversion Indications:  Atrial Fibrillation  Procedure Details Consent: Risks of procedure as well as the alternatives and risks of each were explained to the (patient/caregiver).  Consent for procedure obtained. Time Out: Verified patient identification, verified procedure, site/side was marked, verified correct patient position, special equipment/implants available, medications/allergies/relevent history reviewed, required imaging and test results available.  Performed  Patient placed on cardiac monitor, pulse oximetry, supplemental oxygen as necessary.  Sedation given: 120 mg IV propofol by anesthesia Pacer pads placed anterior and posterior chest.  Cardioverted 1 time(s).  Cardioverted at 200J.biphasic  Evaluation Findings: Post procedure EKG shows: Atrial Fibrillation Complications: None Patient did tolerate procedure well.  Unsuccessful DC-CV. Will continue rate control. F/u with Dr. Johney Frame.    William Fischer 12/03/2011, 1:38 PM

## 2011-12-03 NOTE — Anesthesia Preprocedure Evaluation (Addendum)
Anesthesia Evaluation  Patient identified by MRN, date of birth, ID band Patient awake    Reviewed: Allergy & Precautions, H&P , NPO status , Patient's Chart, lab work & pertinent test results  History of Anesthesia Complications Negative for: history of anesthetic complications  Airway Mallampati: III TM Distance: >3 FB Neck ROM: Full    Dental  (+) Teeth Intact and Dental Advisory Given   Pulmonary former smoker,  bilat pulmonary infiltrates bronchitis          Cardiovascular + dysrhythmias Atrial Fibrillation  Ef 35 %-40%   Neuro/Psych  Headaches, PSYCHIATRIC DISORDERS Depression    GI/Hepatic Neg liver ROS,   Endo/Other  Morbid obesity  Renal/GU      Musculoskeletal   Abdominal   Peds  Hematology   Anesthesia Other Findings   Reproductive/Obstetrics                          Anesthesia Physical  Anesthesia Plan  ASA: III  Anesthesia Plan: General   Post-op Pain Management:    Induction: Intravenous  Airway Management Planned: Mask  Additional Equipment:   Intra-op Plan:   Post-operative Plan:   Informed Consent: I have reviewed the patients History and Physical, chart, labs and discussed the procedure including the risks, benefits and alternatives for the proposed anesthesia with the patient or authorized representative who has indicated his/her understanding and acceptance.   Dental advisory given  Plan Discussed with: CRNA, Anesthesiologist and Surgeon  Anesthesia Plan Comments:        Anesthesia Quick Evaluation

## 2011-12-03 NOTE — Transfer of Care (Signed)
Immediate Anesthesia Transfer of Care Note  Patient: William Fischer  Procedure(s) Performed: Procedure(s) (LRB) with comments: CARDIOVERSION (N/A)  Patient Location: ICU  Anesthesia Type:General  Level of Consciousness: awake, alert , oriented and patient cooperative  Airway & Oxygen Therapy: Patient Spontanous Breathing and Patient connected to nasal cannula oxygen  Post-op Assessment: Report given to PACU RN, Post -op Vital signs reviewed and stable and Patient moving all extremities  Post vital signs: Reviewed and stable  Complications: No apparent anesthesia complications

## 2011-12-03 NOTE — Anesthesia Postprocedure Evaluation (Signed)
  Anesthesia Post-op Note  Patient: William Fischer  Procedure(s) Performed: Procedure(s) (LRB) with comments: CARDIOVERSION (N/A)  Patient Location: PACU and Nursing Unit  Anesthesia Type:MAC  Level of Consciousness: awake  Airway and Oxygen Therapy: Patient Spontanous Breathing  Post-op Pain: mild  Post-op Assessment: Post-op Vital signs reviewed  Post-op Vital Signs: Reviewed  Complications: No apparent anesthesia complications

## 2011-12-04 ENCOUNTER — Encounter (HOSPITAL_COMMUNITY): Payer: Self-pay | Admitting: Physician Assistant

## 2011-12-04 LAB — BASIC METABOLIC PANEL
CO2: 30 mEq/L (ref 19–32)
Calcium: 9.4 mg/dL (ref 8.4–10.5)
Chloride: 97 mEq/L (ref 96–112)
Glucose, Bld: 113 mg/dL — ABNORMAL HIGH (ref 70–99)
Potassium: 4.2 mEq/L (ref 3.5–5.1)
Sodium: 137 mEq/L (ref 135–145)

## 2011-12-04 MED ORDER — SPIRONOLACTONE 25 MG PO TABS: 25.0000 mg | ORAL_TABLET | Freq: Every day | ORAL | Status: DC

## 2011-12-04 MED ORDER — FUROSEMIDE 20 MG PO TABS: 20.0000 mg | ORAL_TABLET | Freq: Every day | ORAL | Status: DC

## 2011-12-04 MED ORDER — DABIGATRAN ETEXILATE MESYLATE 150 MG PO CAPS: 150.0000 mg | ORAL_CAPSULE | Freq: Two times a day (BID) | ORAL | Status: DC

## 2011-12-04 MED ORDER — METOPROLOL TARTRATE 50 MG PO TABS: 75.0000 mg | ORAL_TABLET | Freq: Two times a day (BID) | ORAL | Status: DC

## 2011-12-04 NOTE — Progress Notes (Signed)
Patient: William Fischer Date of Encounter: 12/04/2011, 8:32 AM Admit date: 11/27/2011     Subjective   Failed repeat DC-CV yesterday. Now on lopressor for rate control. HF resolved. BP soft.   Objective  Physical Exam: Vitals: BP 120/64  Pulse 78  Temp 97.9 F (36.6 C) (Oral)  Resp 18  Ht 5\' 11"  (1.803 m)  Wt 140.3 kg (309 lb 4.9 oz)  BMI 43.14 kg/m2  SpO2 93% General: Well developed, obese 46 year old male in no acute distress. Neck: Supple. No JVD. Lungs: Few basilar rales otherwise clear bilaterally to auscultation without wheezes or rhonchi. Breathing is unlabored. Heart: Irregularly irregular S1 S2 without murmur, rub or gallop.  Abdomen: Soft, non-distended. Extremities: No clubbing or cyanosis. No edema.  Distal pedal pulses are 2+ and equal bilaterally. Neuro: Alert and oriented X 3. Moves all extremities spontaneously. No focal deficits.  Intake/Output:  Intake/Output Summary (Last 24 hours) at 12/04/11 0832 Last data filed at 12/04/11 0300  Gross per 24 hour  Intake    660 ml  Output   2650 ml  Net  -1990 ml    Inpatient Medications:     . dabigatran  150 mg Oral Q12H  . furosemide  40 mg Oral Daily  . metoprolol tartrate  50 mg Oral BID  . pantoprazole  40 mg Oral Daily  . sodium chloride  3 mL Intravenous Q12H  . spironolactone  12.5 mg Oral Daily  . triamcinolone ointment  1 application Topical BID  . [DISCONTINUED] carvedilol  6.25 mg Oral BID WC  . [DISCONTINUED] diltiazem  240 mg Oral Daily  . [DISCONTINUED] furosemide  40 mg Oral BID  . [DISCONTINUED] potassium chloride  20 mEq Oral BID  . [DISCONTINUED] sotalol  160 mg Oral Q12H    Labs:  [facility] 12/04/11 0548 12/03/11 0413  NA 137 140  K 4.2 3.9  CL 97 98  CO2 30 34*  GLUCOSE 113* 111*  BUN 19 20  CREATININE 1.10 1.08  CALCIUM 9.4 9.6  MG 2.4 2.2  PHOS -- --    Radiology/Studies: Ct Angio Chest Pe W/cm &/or Wo Cm  11/27/2011  *RADIOLOGY REPORT*  Clinical Data:  Elevated D-dimer.  Rule out pulmonary embolus. Shortness of breath.  Increased heart rate.  CT ANGIOGRAPHY CHEST  Technique:  Multidetector CT imaging of the chest using the standard protocol during bolus administration of intravenous contrast. Multiplanar reconstructed images including MIPs were obtained and reviewed to evaluate the vascular anatomy.  Contrast: OMNIPAQUE IOHEXOL 350 MG/ML SOLN  Comparison: Chest x-ray 11/27/2011  Findings: The pulmonary arteries are well opacified.  There is no evidence for acute pulmonary embolus.  The heart is enlarged.  There are diffusely slightly enlarged mediastinal and hilar lymph nodes.  Largest nodes are identified in the mediastinum, measuring 2.2 cm in the prevascular space and 2.3 cm in the right paratracheal region.  There are bilateral pleural effusions, small.  Patchy mosaic changes are identified within the lung bases dependently, most consistent with mild pulmonary edema.  Images of the upper abdomen show a small amount.  Pelvic fluid.  Visualized osseous structures have a normal appearance.  IMPRESSION:  1.  Technically adequate exam showing no evidence for acute pulmonary embolus. 2.  Cardiomegaly and changes of pulmonary edema. 3.  Mediastinal and hilar lymph nodes are favored to be related to pulmonary edema.   Original Report Authenticated By: Norva Pavlov, M.D.    Dg [facility]  11/30/2011  *RADIOLOGY REPORT*  Clinical Data: Shortness of breath  PORTABLE CHEST - 1 VIEW  Comparison: Portable chest x-ray of 11/28/2011  Findings: There is focal parenchymal opacity in the right mid lung. This would be unusual for edema and therefore pneumonia is the primary consideration.  Cardiomegaly is stable.  No definite effusion is seen.  IMPRESSION: Parenchymal opacity in the right mid lung suspicious for pneumonia. Stable cardiomegaly.   Original Report Authenticated By: Dwyane Dee, M.D.     Transthoracic Echocardiogram 11/28/2011  Study  Conclusions - Left ventricle: The cavity size was mildly dilated. Wall thickness was normal. Systolic function was mildly to moderately reduced. The estimated ejection fraction was 40%, in the range of 40% to 45%. Diffuse hypokinesis. The study is not technically sufficient to allow evaluation of LV diastolic function. - Mitral valve: Moderate regurgitation. - Left atrium: The atrium was moderately dilated. LA dimension 56 mm. - Right ventricle: Systolic function was mildly reduced. - Right atrium: The atrium was moderately dilated. - Pericardium, extracardiac: A small pericardial effusion was identified.  12-lead ECG pending this AM Telemetry shows persistent atrial fibrillation, currently rate controlled     Assessment   1. Atrial fibrillation, new onset    --failed DC-CV x 3 on 11/13. Now on sotalol 2. Acute systolic HF, EF ~ 40%, RV mildly HK. LA 5.6cm (suspect tachy-induced)    --diuresing well weight down 13 pounds 3. Morbid obesity 4. Probable OSA   Plan   Failed repeat DC-CV. Will plan rate control. Rate 80-90s in bed but jumps up to 110. BP soft. I have chosen short-acting lopressor as BP too soft for carvedilol and needs more rate control. (Toprol too expensive).   HF resolved. Need to be careful about overdiuresis.  Will d/c today on:  Pradaxa 150 bid (we reviewed submission of assistance forms) Lopressor 75 bid Lasix 20 daily Spironolactone 25 daily  He will see Tereso Newcomer this week for check on AF rate, edema and BMET. F/u with Dr. Johney Frame within a month to see how he is tolerating AF and to discuss further options. Will need outpatient sleep study.    Truman Hayward 8:32 AM

## 2011-12-04 NOTE — Discharge Summary (Signed)
Discharge Summary   Patient ID: William Fischer,  MRN: [id number], DOB/AGE: 09-08-1965 46 y.o.  Admit date: 11/27/2011 Discharge date: 12/04/2011   Primary Care Physician:  [name]   Primary Cardiologist:  Dr. Hillis Range  Primary Electrophysiologist:  Dr. Hillis Range   Reason for Admission:  Acute Systolic CHF in setting of AFib with RVR  Primary Discharge Diagnoses:  1. Atrial Fibrillation with Rapid Ventricular Rate     - s/p DCCV => unsuccessful     - Pradaxa initiated and Patient Assistance completed     - Failed Sotalol 2. Acute Systolic CHF     - d/c weight 409 lbs 3. 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 Macular Rash     - reaction to IV dye 5. Glucose Intolerance     - follow up with PCP 6. Snoring     - needs outpatient sleep study  Secondary Discharge Diagnoses:   Past Medical History  Diagnosis Date  . Chicken pox as a child  . Migraine 06/29/2011  . Obesity 06/29/2011  . Depression with anxiety 06/29/2011  . Atrial fibrillation     admx 11/13 with acute sCHF in setting of RVR  => a. failed DCCV x 2; b. Pradaxa started;  c. failed sotalol  . Chronic systolic heart failure     a. echo 11/13: Ef 40-45%, diff HK, mod MR, mod LAE, mild RVE, mod RAE, small effusion;   b. TEE 11/13:  EF 35-40%, no LAA clot  . Cardiomyopathy     likely tachy mediated in setting of AF with RVR  . Snoring     patient needs sleep study  . Allergy to IVP dye      Allergies:    Allergies  Allergen Reactions  . Contrast Media (Iodinated Diagnostic Agents)     a diffuse macular rash after CTA chest     Procedures Performed This Admission:    1. TEE guided Cardioversion 11/28/11 2. DC Cardioversion 11/30/11   Hospital Course: William Fischer is a 46 y.o. morbidly obese male with no significant PMH who presented on the day of admission with 5 days of dyspnea.  Patient noted to be in AFib with RVR.  DDimer was elevated and chest CTA was negative for  pulmonary embolism.  He developed diffuse rash after CT contrast was given.  CXR and CT identified evidence of pulmonary edema. The patient was placed on rate control therapy with diltiazem. He was initially placed on heparin but then switched to Pradaxa. He was diuresed for CHF.  Echocardiogram demonstrated reduced LV function with an EF of 40-45% and moderate MR. 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 Patient was transitioned to oral Lasix after several days of IV Lasix. After 5 doses of Pradaxa, the patient was set up for TEE guided cardioversion. This was performed 11/30/11. Cardioversion was unsuccessful. He was seen by Dr. Johney Frame for electrophysiology consultation.  He has severe left atrial enlargement and probably long-standing persistent atrial fibrillation. Given his reduced ejection fraction, pursuing sinus rhythm was recommended. Antiarrhythmic drug therapy was recommended. The patient has no insurance and it was felt that Tikosyn was not a viable option. He was therefore placed on sotalol ($4 at [facility]). After adequate loading with sotalol, another attempt at cardioversion was performed 12/03/11. This was also unsuccessful. Therefore a strategy of rate control will be pursued.  He is not a candidate for PVI given his refractory afib, but might be a  candidate for either a convergent procedure or more traditional MAZE/ mini MAZE once we are able to obtain either [facility] Financial assistance or a more definitive insurance coverage.  Patient noted to have episodes of snoring and decreased O2 sats during TEE and he is felt to have significant sleep apnea.  He also needs an outpatient sleep study once financial coverage can be obtained. Of note, patient diuresed greater than 10 L. His admission weight was 323 pounds and discharge weight was 309 pounds. Patient had soft blood pressures. ACE inhibitor could not be initiated. Overall, it was felt that his cardiomyopathy was  tachycardia mediated. His blood pressure was soft and he was unable to tolerate carvedilol. Due to his financial constraints, metoprolol tartrate was used. This was titrated to a dose of 75 mg twice daily at discharge. Patient was discharged on Lasix 20 mg and spironolactone 25 mg daily. We will need close followup in one week to assess his fluid status and heart rate control. He will need a followup basic metabolic panel. We will also need to see Dr. Johney Frame in 1 month. He was evaluated by Dr. Gala Romney on the morning of discharge and felt ready for discharge to home.   Discharge Vitals: Blood pressure 110/58, pulse 89, temperature 97.9 F (36.6 C), temperature source Oral, resp. rate 18, height 5\' 11"  (1.803 m), weight 309 lb 4.9 oz (140.3 kg), SpO2 93.00%.   Labs: Lab Results  Component Value Date   WBC 10.4 11/27/2011   HGB 14.4 11/27/2011   HCT 44.0 11/27/2011   MCV 92.8 11/27/2011   PLT 327 11/27/2011     Lab 12/04/11 0548 11/27/11 1420  NA 137 --  K 4.2 --  CL 97 --  CO2 30 --  BUN 19 --  CREATININE 1.10 --  CALCIUM 9.4 --  PROT -- 7.5  BILITOT -- 0.7  ALKPHOS -- 67  ALT -- 55*  AST -- 31  GLUCOSE 113* --   No results found for this basename: CKTOTAL:4,CKMB:4,TROPONINI:4 in the last 72 hours Lab Results  Component Value Date   CHOL 138 11/28/2011   HDL 31* 11/28/2011   LDLCALC 97 11/28/2011   TRIG 50 11/28/2011   Lab Results  Component Value Date   DDIMER 2.16* 11/27/2011   Lab Results  Component Value Date   TSH 1.790 11/27/2011   Lab Results  Component Value Date   HGBA1C 6.2* 11/27/2011     Diagnostic Procedures and Studies:  Ct Angio Chest Pe W/cm &/or Wo Cm  11/27/2011  IMPRESSION:  1.  Technically adequate exam showing no evidence for acute pulmonary embolus. 2.  Cardiomegaly and changes of pulmonary edema. 3.  Mediastinal and hilar lymph nodes are favored to be related to pulmonary edema.   Original Report Authenticated By: Norva Pavlov, M.D.      Dg Chest Port 1 View   11/30/2011   IMPRESSION: Parenchymal opacity in the right mid lung suspicious for pneumonia. Stable cardiomegaly.   Original Report Authenticated By: Dwyane Dee, M.D.    Dg Chest Port 1 View  11/28/2011   IMPRESSION: CHF, unchanged.   Original Report Authenticated By: Ulyses Southward, M.D.    Dg Chest Port 1 View  11/27/2011    IMPRESSION: CHF and cardiomegaly.   Original Report Authenticated By: Irish Lack, M.D.     1. 2D Echocardiogram 11/28/11:  - Left ventricle: The cavity size was mildly dilated. Wall  thickness was normal. Systolic function was mildly to moderately reduced. The  estimated ejection fraction was 40%, in the range of 40% to 45%. Diffuse hypokinesis. The study is not technically sufficient to allow evaluation of LV diastolic function. - Mitral valve: Moderate regurgitation. - Left atrium: The atrium was moderately dilated. - Right ventricle: Systolic function was mildly reduced. - Right atrium: The atrium was moderately dilated. - Pericardium, extracardiac: A small pericardial effusion was identified.   2. Transesophageal Echocardiogram 11/30/11:  - Left ventricle: Systolic function was moderately reduced. The estimated ejection fraction was in the range of 35% to 40%. - Aortic valve: No evidence of vegetation. - Mitral valve: No evidence of vegetation. - Left atrium: No evidence of thrombus in the atrial cavity or appendage. No evidence of thrombus in the appendage. Transesophageal echocardiography. 2D and color Doppler.   Disposition:   Pt is being discharged home today in good condition.  Follow-up Plans & Appointments      Follow-up Information    Follow up with Tereso Newcomer, PA. In 1 week. (office will call to arrange (blood will be drawn - BMET))    Contact information:   [address] [place] [address] [phone]       Follow up with Hillis Range, MD. In 4 weeks. (office will call to arrange)     Contact information:   [address], [address] [place] [address] [phone]          Discharge Medications    Medication List     As of 12/04/2011  9:04 AM    STOP taking these medications         ibuprofen 200 MG tablet   Commonly known as: ADVIL,MOTRIN      TAKE these medications         dabigatran 150 MG Caps   Commonly known as: PRADAXA   Take 1 capsule (150 mg total) by mouth every 12 (twelve) hours.      furosemide 20 MG tablet   Commonly known as: LASIX   Take 1 tablet (20 mg total) by mouth daily.      metoprolol 50 MG tablet   Commonly known as: LOPRESSOR   Take 1.5 tablets (75 mg total) by mouth 2 (two) times daily.      spironolactone 25 MG tablet   Commonly known as: ALDACTONE   Take 1 tablet (25 mg total) by mouth daily.          Outstanding Labs/Studies 1.  BMET in 1 week  Duration of Discharge Encounter: Greater than 30 minutes including physician and PA time.  SignedTereso Newcomer, PA-C  9:04 AM 12/04/2011      [address] [place], [place]  [address] Phone: [phone] Fax:  [phone]

## 2011-12-04 NOTE — Progress Notes (Signed)
Patient being discharged at this time. IV removed, catheter intact and dressing applied to site. He denies any discomfort or needs and is ready to go home. Reviewed all discharge instructions with patient and he verbalized understanding. Instructed him on discharge medications and wrote on forms when his next doses are due. He verbalized understanding of all instructions and I called pharmacy per his request to see if they were ready and how much each script would cost the patient. All of his medications are $3 per the [facility] pharmacist and I informed him of this information. No other needs at this time and patient now waiting on ride to arrive. Will continue to monitor.

## 2011-12-05 ENCOUNTER — Telehealth: Payer: Self-pay | Admitting: Physician Assistant

## 2011-12-05 ENCOUNTER — Encounter (HOSPITAL_COMMUNITY): Payer: Self-pay | Admitting: Internal Medicine

## 2011-12-05 NOTE — Telephone Encounter (Signed)
Per scott after hours voicemail, message to be left for carol to work pt in this week will scott, as well as bmet same day

## 2011-12-05 NOTE — Anesthesia Postprocedure Evaluation (Signed)
Anesthesia Post Note  Patient: William Fischer  Procedure(s) Performed: Procedure(s) (LRB): TRANSESOPHAGEAL ECHOCARDIOGRAM (TEE) (N/A) CARDIOVERSION (N/A)  Anesthesia type: General  Patient location: PACU  Post pain: Pain level controlled and Adequate analgesia  Post assessment: Post-op Vital signs reviewed, Patient's Cardiovascular Status Stable, Respiratory Function Stable, Patent Airway and Pain level controlled  Last Vitals:  Filed Vitals:   12/04/11 0753  BP: 110/58  Pulse: 89  Temp: 36.6 C  Resp: 18    Post vital signs: Reviewed and stable  Level of consciousness: awake, alert  and oriented  Complications: No apparent anesthesia complications

## 2011-12-05 NOTE — Discharge Summary (Signed)
Pt seen and examined with William Fischer on day of d/c. See my progress note for full details.

## 2011-12-08 ENCOUNTER — Other Ambulatory Visit: Payer: Self-pay | Admitting: *Deleted

## 2011-12-08 ENCOUNTER — Ambulatory Visit (INDEPENDENT_AMBULATORY_CARE_PROVIDER_SITE_OTHER): Payer: Medicaid Other | Admitting: Physician Assistant

## 2011-12-08 ENCOUNTER — Encounter: Payer: Self-pay | Admitting: Physician Assistant

## 2011-12-08 ENCOUNTER — Other Ambulatory Visit (INDEPENDENT_AMBULATORY_CARE_PROVIDER_SITE_OTHER): Payer: Medicaid Other

## 2011-12-08 VITALS — BP 118/78 | HR 87 | Ht 70.5 in | Wt 306.1 lb

## 2011-12-08 DIAGNOSIS — R0989 Other specified symptoms and signs involving the circulatory and respiratory systems: Secondary | ICD-10-CM

## 2011-12-08 DIAGNOSIS — I5022 Chronic systolic (congestive) heart failure: Secondary | ICD-10-CM

## 2011-12-08 DIAGNOSIS — I5021 Acute systolic (congestive) heart failure: Secondary | ICD-10-CM

## 2011-12-08 DIAGNOSIS — I4891 Unspecified atrial fibrillation: Secondary | ICD-10-CM | POA: Insufficient documentation

## 2011-12-08 LAB — BASIC METABOLIC PANEL
CO2: 26 mEq/L (ref 19–32)
Chloride: 102 mEq/L (ref 96–112)
Glucose, Bld: 98 mg/dL (ref 70–99)
Potassium: 4.5 mEq/L (ref 3.5–5.1)
Sodium: 136 mEq/L (ref 135–145)

## 2011-12-08 NOTE — Patient Instructions (Addendum)
NO CHANGES WERE MADE TODAY  LAB TODAY; BMET   KEEP THE APPT YOU HAVE WITH DR. ALLRED 01/05/12 @ 10 AM

## 2011-12-08 NOTE — Progress Notes (Signed)
[address] [place], [place]  [address] Phone: [phone], Fax:  [phone]  Date:  12/08/2011   Name:  NEWTON HAJJ   DOB:  [birth year]   MRN:  [id number]  PCP:  [name], MD  Primary Cardiologist:  Dr. Hillis Range  Primary Electrophysiologist:  Dr. Hillis Range    History of Present Illness: William Fischer is a 46 y.o. male who returns for follow up after recent admission to the hospital with acute systolic CHF in the setting of AFib with RVR.  He was admitted 11/10-11/17. He presented with 5 days of dyspnea. He was noted to be in Afib with RVR. Chest CT was negative for pulmonary embolism. Unfortunately, the patient had a reaction after his CT scan denoted by diffuse macular rash which was felt to be secondary to contrast media. The echocardiogram demonstrated reduced LV function with an EF of 40-45%. He was anticoagulated. TEE-DCCV was unsuccessful. He was seen by Dr. Johney Frame who recommended sotalol. Repeat DCCV was unsuccessful. Rate control was recommended. Sotalol was d/c'd The patient was not felt to be a candidate for PVI but might be a candidate for either conversion procedure or more traditional maze/mini maze. Patient has no insurance and would need to obtain financial assistance or more definitive insurance coverage prior to considering this.  He was set up for financial assistance for Pradaxa.  He will also need a sleep study arranged as he did have significant episodes of snoring and O2 desaturation. He was diuresed and lost greater than 10 L and was discharged home at 309 pounds. Blood pressures were soft and titration of his medications was difficult.  Since d/c, he is feeling good.  The patient denies chest pain, shortness of breath, syncope, orthopnea, PND or significant pedal edema.   Labs (11/13):   K 4.2, creatinine 1.10, ALT 55, LDL 97, Hgb 14.4, TSH 1.79  Wt Readings from Last 3 Encounters:  12/04/11 309 lb 4.9 oz (140.3 kg)    12/04/11 309 lb 4.9 oz (140.3 kg)  12/04/11 309 lb 4.9 oz (140.3 kg)     Past Medical History  Diagnosis Date  . Chicken pox as a child  . Migraine 06/29/2011  . Obesity 06/29/2011  . Depression with anxiety 06/29/2011  . Atrial fibrillation     admx 11/13 with acute sCHF in setting of RVR  => a. failed DCCV x 2; b. Pradaxa started;  c. failed sotalol  . Chronic systolic heart failure     a. echo 11/13: Ef 40-45%, diff HK, mod MR, mod LAE, mild RVE, mod RAE, small effusion;   b. TEE 11/13:  EF 35-40%, no LAA clot  . Cardiomyopathy     likely tachy mediated in setting of AF with RVR  . Snoring     patient needs sleep study  . Allergy to IVP dye     Current Outpatient Prescriptions  Medication Sig Dispense Refill  . dabigatran (PRADAXA) 150 MG CAPS Take 1 capsule (150 mg total) by mouth every 12 (twelve) hours.  60 capsule  11  . furosemide (LASIX) 20 MG tablet Take 1 tablet (20 mg total) by mouth daily.  30 tablet  11  . metoprolol (LOPRESSOR) 50 MG tablet Take 1.5 tablets (75 mg total) by mouth 2 (two) times daily.  90 tablet  11  . spironolactone (ALDACTONE) 25 MG tablet Take 1 tablet (25 mg total) by mouth daily.  30 tablet  11    Allergies: Allergies  Allergen Reactions  . Contrast Media (Iodinated Diagnostic Agents)     a diffuse macular rash after CTA chest     Social History:  The patient  reports that he quit smoking about 11 years ago. His smoking use included Cigarettes. He has a 20 pack-year smoking history. He has never used smokeless tobacco. He reports that he does not drink alcohol or use illicit drugs.   ROS:  Please see the history of present illness.   All other systems reviewed and negative.   PHYSICAL EXAM: VS:  BP 118/78  Pulse 87  Ht 5' 10.5" (1.791 m)  Wt 306 lb 1.9 oz (138.855 kg)  BMI 43.30 kg/m2 Well nourished, well developed, in no acute distress HEENT: normal Neck: no JVD Cardiac:  normal S1, S2; irregularly irregular rhythm; no  murmur Lungs:  clear to auscultation bilaterally, no wheezing, rhonchi or rales Abd: soft, nontender, no hepatomegaly Ext: no edema Skin: warm and dry Neuro:  CNs 2-12 intact, no focal abnormalities noted  EKG:  AFib, HR 87      ASSESSMENT AND PLAN:  1. Atrial Fibrillation:   Rate is controlled. He admits to compliance with Pradaxa. He has completed the assistance forms and mailed them to the company. We will provide him with samples today. Continue current dose of Lopressor. Followup with Dr. Johney Frame as planned next month.  2. Chronic Systolic CHF:   Volume is stable. Continue current dose of Lasix and spironolactone. He has changed his diet and is feeling good. Check a basic metabolic panel today. His cardiomyopathy is likely tachycardia mediated. He has significant financial constraints. He may be getting qualified for Medicaid. At this point, I will not make any further medication adjustments. We could consider adding an ACE inhibitor in the future. His blood pressure is somewhat soft. Followup with Dr. Johney Frame as planned.  3. Snoring:   He will eventually need a sleep study once his financial situation is better.  4. Disposition:  Followup with Dr. Johney Frame next month as planned for further recommendations regarding his rhythm.   Signed, Tereso Newcomer, PA-C  1:37 PM 12/08/2011

## 2011-12-09 ENCOUNTER — Telehealth: Payer: Self-pay | Admitting: *Deleted

## 2011-12-09 NOTE — Telephone Encounter (Signed)
Message copied by Tarri Fuller on Fri Dec 09, 2011  9:39 AM ------      Message from: [place], [place] T      Created: Thu Dec 08, 2011  5:23 PM       Potassium and kidney function look good.      Continue with current treatment plan.      Tereso Newcomer, PA-C  4:49 PM 12/01/2011

## 2011-12-09 NOTE — Telephone Encounter (Signed)
lmom labs ok 

## 2011-12-20 ENCOUNTER — Telehealth: Payer: Self-pay | Admitting: Physician Assistant

## 2011-12-20 NOTE — Telephone Encounter (Signed)
lmptcb to find out type of oral surgery, name of surgeon, and if any meds need to be held.

## 2011-12-20 NOTE — Telephone Encounter (Signed)
Pt rtn call to carol  [phone]

## 2011-12-20 NOTE — Telephone Encounter (Signed)
lmptcb to find out type of oral surgery, name of surgeon, and if any meds need to be held. 

## 2011-12-23 NOTE — Telephone Encounter (Signed)
F/u  Returning call back to nurse.  

## 2012-01-05 ENCOUNTER — Encounter: Payer: Self-pay | Admitting: Internal Medicine

## 2012-01-05 ENCOUNTER — Ambulatory Visit (INDEPENDENT_AMBULATORY_CARE_PROVIDER_SITE_OTHER): Payer: Medicaid Other | Admitting: Internal Medicine

## 2012-01-05 DIAGNOSIS — R0609 Other forms of dyspnea: Secondary | ICD-10-CM

## 2012-01-05 DIAGNOSIS — R0683 Snoring: Secondary | ICD-10-CM

## 2012-01-05 DIAGNOSIS — I4891 Unspecified atrial fibrillation: Secondary | ICD-10-CM

## 2012-01-05 DIAGNOSIS — I5021 Acute systolic (congestive) heart failure: Secondary | ICD-10-CM

## 2012-01-05 NOTE — Progress Notes (Signed)
PCP:  [name], MD  The patient presents today for routine electrophysiology followup.  Since his recent hospitalization, the patient reports doing very well.  He reports significant improvement in symptoms of CHF with rate control.  His swelling is much improved and SOB is resolved.  Today, he denies symptoms of palpitations, chest pain, orthopnea, PND,dizziness, presyncope, syncope, or neurologic sequela.  The patient feels that he is tolerating medications without difficulties and is otherwise without complaint today.   Past Medical History  Diagnosis Date  . Chicken pox as a child  . Migraine 06/29/2011  . Obesity 06/29/2011  . Depression with anxiety 06/29/2011  . Atrial fibrillation     admx 11/13 with acute sCHF in setting of RVR  => a. failed DCCV x 2; b. Pradaxa started;  c. failed sotalol  . Chronic systolic heart failure     a. echo 11/13: Ef 40-45%, diff HK, mod MR, mod LAE, mild RVE, mod RAE, small effusion;   b. TEE 11/13:  EF 35-40%, no LAA clot  . Cardiomyopathy     likely tachy mediated in setting of AF with RVR  . Snoring     patient needs sleep study  . Allergy to IVP dye    Past Surgical History  Procedure Date  . Tee without cardioversion 11/30/2011    Procedure: TRANSESOPHAGEAL ECHOCARDIOGRAM (TEE);  Surgeon: Vesta Mixer, MD;  Location: [facility] ENDOSCOPY;  Service: Cardiovascular;  Laterality: N/A;  . Cardioversion 11/30/2011    Procedure: CARDIOVERSION;  Surgeon: Vesta Mixer, MD;  Location: [facility] ENDOSCOPY;  Service: Cardiovascular;  Laterality: N/A;  . Cardioversion 12/03/2011    Procedure: CARDIOVERSION;  Surgeon: Dolores Patty, MD;  Location: [facility] OR;  Service: Cardiovascular;  Laterality: N/A;    Current Outpatient Prescriptions  Medication Sig Dispense Refill  . dabigatran (PRADAXA) 150 MG CAPS Take 1 capsule (150 mg total) by mouth every 12 (twelve) hours.  60 capsule  11  . furosemide (LASIX) 20 MG tablet Take 1 tablet (20 mg total) by mouth  daily.  30 tablet  11  . metoprolol (LOPRESSOR) 50 MG tablet Take 1.5 tablets (75 mg total) by mouth 2 (two) times daily.  90 tablet  11  . spironolactone (ALDACTONE) 25 MG tablet Take 1 tablet (25 mg total) by mouth daily.  30 tablet  11    Allergies  Allergen Reactions  . Contrast Media (Iodinated Diagnostic Agents)     a diffuse macular rash after CTA chest     History   Social History  . Marital Status: Divorced    Spouse Name: N/A    Number of Children: N/A  . Years of Education: N/A   Occupational History  . Not on file.   Social History Main Topics  . Smoking status: Former Smoker -- 1.0 packs/day for 20 years    Types: Cigarettes    Quit date: 01/18/2000  . Smokeless tobacco: Never Used  . Alcohol Use: No  . Drug Use: No  . Sexually Active: No   Other Topics Concern  . Not on file   Social History Narrative  . No narrative on file    Family History  Problem Relation Age of Onset  . Hypertension Mother   . Hypertension Father   . Aneurysm Father   . Alcohol abuse Father   . Cancer Maternal Grandmother     brain  . Diabetes Maternal Grandfather     type 2  . Parkinsonism Maternal Grandfather   .  Cancer Paternal Grandmother     breast  . Diabetes Paternal Grandmother   . Alcohol abuse Paternal Grandfather     ROS-  All systems are reviewed and are negative except as outlined in the HPI above   Physical Exam: Filed Vitals:   01/05/12 1014  BP: 106/66  Pulse: 69  Height: 5\' 11"  (1.803 m)  Weight: 300 lb (136.079 kg)  SpO2: 97%    GEN- The patient is well appearing, alert and oriented x 3 today.   Head- normocephalic, atraumatic Eyes-  Sclera clear, conjunctiva pink Ears- hearing intact Oropharynx- clear Neck- supple, no JVP Lymph- no cervical lymphadenopathy Lungs- Clear to ausculation bilaterally, normal work of breathing Heart- irregular rate and rhythm, no murmurs, rubs or gallops, PMI not laterally displaced GI- soft, NT, ND, +  BS Extremities- no clubbing, cyanosis, trace edema MS- no significant deformity or atrophy Skin- no rash or lesion Psych- euthymic mood, full affect Neuro- strength and sensation are intact  ekg today reveals afib, V rate 69 bpm, nonspecific ST/ T changes, poor R wave progression  Assessment and Plan:

## 2012-01-05 NOTE — Assessment & Plan Note (Signed)
Weight loss advised Given snoring, afib, and body habitous, will order a sleep study today

## 2012-01-05 NOTE — Assessment & Plan Note (Signed)
Persistent atrial fibrillation with severe LA enlargement. I think that our ability to maintain sinus rhythm long term is quite low.  He has failed medical therapy with sotalol. I will continue rate control for now and follow.  We may consider either a trial of amiodarone or referral for the convergent procedure depending on his clinical progression over the next few months. Continue pradaxa for stroke prevention. Will enroll in Orbit II AF.

## 2012-01-05 NOTE — Patient Instructions (Addendum)
Your physician recommends that you schedule a follow-up appointment in: 6 weeks with Dr Allred  Your physician has recommended that you have a sleep study. This test records several body functions during sleep, including: brain activity, eye movement, oxygen and carbon dioxide blood levels, heart rate and rhythm, breathing rate and rhythm, the flow of air through your mouth and nose, snoring, body muscle movements, and chest and belly movement.    

## 2012-01-05 NOTE — Assessment & Plan Note (Signed)
Much improved with rate control No changes today Will need to add ACE inhibitor as BP allows Sodium restriction

## 2012-02-20 ENCOUNTER — Encounter: Payer: Self-pay | Admitting: Internal Medicine

## 2012-02-20 ENCOUNTER — Ambulatory Visit (INDEPENDENT_AMBULATORY_CARE_PROVIDER_SITE_OTHER): Payer: Medicaid Other | Admitting: Internal Medicine

## 2012-02-20 VITALS — BP 110/80 | HR 76 | Ht 71.0 in | Wt 285.4 lb

## 2012-02-20 DIAGNOSIS — I5022 Chronic systolic (congestive) heart failure: Secondary | ICD-10-CM

## 2012-02-20 DIAGNOSIS — I4891 Unspecified atrial fibrillation: Secondary | ICD-10-CM

## 2012-02-20 LAB — BASIC METABOLIC PANEL
BUN: 14 mg/dL (ref 6–23)
CO2: 29 mEq/L (ref 19–32)
Calcium: 9.5 mg/dL (ref 8.4–10.5)
Creatinine, Ser: 1 mg/dL (ref 0.4–1.5)

## 2012-02-20 LAB — CBC WITH DIFFERENTIAL/PLATELET
Basophils Absolute: 0 10*3/uL (ref 0.0–0.1)
Basophils Relative: 0.2 % (ref 0.0–3.0)
Eosinophils Absolute: 0.3 10*3/uL (ref 0.0–0.7)
HCT: 54.2 % — ABNORMAL HIGH (ref 39.0–52.0)
Hemoglobin: 17.9 g/dL — ABNORMAL HIGH (ref 13.0–17.0)
Lymphs Abs: 2.7 10*3/uL (ref 0.7–4.0)
MCHC: 33 g/dL (ref 30.0–36.0)
Neutro Abs: 7.3 10*3/uL (ref 1.4–7.7)
RDW: 17.2 % — ABNORMAL HIGH (ref 11.5–14.6)

## 2012-02-20 MED ORDER — SILDENAFIL CITRATE 100 MG PO TABS: 100.0000 mg | ORAL_TABLET | Freq: Every day | ORAL | Status: DC | PRN

## 2012-02-20 NOTE — Assessment & Plan Note (Signed)
Doing very well with rate control He is clear that he would not wish to consider amiodarone or ablation at this time Continue anticoagulation long term bmet and cbc today

## 2012-02-20 NOTE — Patient Instructions (Addendum)
Your physician recommends that you schedule a follow-up appointment in: 3 months with Dr Johney Frame   Your physician recommends that you return for lab work today  CBC/ BMP   Your physician has requested that you have an echocardiogram. Echocardiography is a painless test that uses sound waves to create images of your heart. It provides your doctor with information about the size and shape of your heart and how well your heart's chambers and valves are working. This procedure takes approximately one hour. There are no restrictions for this procedure.    Your physician has recommended you make the following change in your medication:  1) Viagra 100mg  --1/2 tablet as directed may increase to a full tablet

## 2012-02-20 NOTE — Progress Notes (Signed)
PCP:  [name], MD  The patient presents today for routine electrophysiology followup.  Since his recent hospitalization, the patient reports doing very well.  He has lost an additional 15 pounds since his last visit.  That's 40 lbs since November!.   He reports significant improvement in symptoms of CHF with rate control.  His swelling and SOB are resolved.  Today, he denies symptoms of palpitations, chest pain, orthopnea, PND,dizziness, presyncope, syncope, or neurologic sequela.  The patient feels that he is tolerating medications without difficulties and is otherwise without complaint today.   Past Medical History  Diagnosis Date  . Chicken pox as a child  . Migraine 06/29/2011  . Obesity 06/29/2011  . Depression with anxiety 06/29/2011  . Atrial fibrillation     admx 11/13 with acute sCHF in setting of RVR  => a. failed DCCV x 2; b. Pradaxa started;  c. failed sotalol  . Chronic systolic heart failure     a. echo 11/13: Ef 40-45%, diff HK, mod MR, mod LAE, mild RVE, mod RAE, small effusion;   b. TEE 11/13:  EF 35-40%, no LAA clot  . Cardiomyopathy     likely tachy mediated in setting of AF with RVR  . Snoring     patient needs sleep study  . Allergy to IVP dye    Past Surgical History  Procedure Date  . Tee without cardioversion 11/30/2011    Procedure: TRANSESOPHAGEAL ECHOCARDIOGRAM (TEE);  Surgeon: Vesta Mixer, MD;  Location: [facility] ENDOSCOPY;  Service: Cardiovascular;  Laterality: N/A;  . Cardioversion 11/30/2011    Procedure: CARDIOVERSION;  Surgeon: Vesta Mixer, MD;  Location[facility] ENDOSCOPY;  Service: Cardiovascular;  Laterality: N/A;  . Cardioversion 12/03/2011    Procedure: CARDIOVERSION;  Surgeon: Dolores Patty, MD;  Location: [facility] OR;  Service: Cardiovascular;  Laterality: N/A;    Current Outpatient Prescriptions  Medication Sig Dispense Refill  . dabigatran (PRADAXA) 150 MG CAPS Take 1 capsule (150 mg total) by mouth every 12 (twelve) hours.  60 capsule  11   . furosemide (LASIX) 20 MG tablet Take 1 tablet (20 mg total) by mouth daily.  30 tablet  11  . metoprolol (LOPRESSOR) 50 MG tablet Take 1.5 tablets (75 mg total) by mouth 2 (two) times daily.  90 tablet  11  . spironolactone (ALDACTONE) 25 MG tablet Take 1 tablet (25 mg total) by mouth daily.  30 tablet  11  . sildenafil (VIAGRA) 100 MG tablet Take 1 tablet (100 mg total) by mouth daily as needed for erectile dysfunction.  10 tablet  3    Allergies  Allergen Reactions  . Contrast Media (Iodinated Diagnostic Agents)     a diffuse macular rash after CTA chest     History   Social History  . Marital Status: Divorced    Spouse Name: N/A    Number of Children: N/A  . Years of Education: N/A   Occupational History  . Not on file.   Social History Main Topics  . Smoking status: Former Smoker -- 1.0 packs/day for 20 years    Types: Cigarettes    Quit date: 01/18/2000  . Smokeless tobacco: Never Used  . Alcohol Use: No  . Drug Use: No  . Sexually Active: No   Other Topics Concern  . Not on file   Social History Narrative  . No narrative on file    Family History  Problem Relation Age of Onset  . Hypertension Mother   . Hypertension  Father   . Aneurysm Father   . Alcohol abuse Father   . Cancer Maternal Grandmother     brain  . Diabetes Maternal Grandfather     type 2  . Parkinsonism Maternal Grandfather   . Cancer Paternal Grandmother     breast  . Diabetes Paternal Grandmother   . Alcohol abuse Paternal Grandfather     ROS-  All systems are reviewed and are negative except as outlined in the HPI above   Physical Exam: Filed Vitals:   02/20/12 1001  BP: 110/80  Pulse: 76  Height: 5\' 11"  (1.803 m)  Weight: 285 lb 6.4 oz (129.457 kg)    GEN- The patient is well appearing, alert and oriented x 3 today.   Head- normocephalic, atraumatic Eyes-  Sclera clear, conjunctiva pink Ears- hearing intact Oropharynx- clear Neck- supple, no JVP Lymph- no cervical  lymphadenopathy Lungs- Clear to ausculation bilaterally, normal work of breathing Heart- irregular rate and rhythm, no murmurs, rubs or gallops, PMI not laterally displaced GI- soft, NT, ND, + BS Extremities- no clubbing, cyanosis, no edema MS- no significant deformity or atrophy Skin- no rash or lesion Psych- euthymic mood, full affect Neuro- strength and sensation are intact  ekg today reveals afib, V rate 67 bpm, nonspecific ST/ T changes, poor R wave progression  Assessment and Plan:

## 2012-02-20 NOTE — Assessment & Plan Note (Signed)
Clinically much improved with rate control and medical therapy No changes at this time Repeat echo upon return

## 2012-02-20 NOTE — Assessment & Plan Note (Signed)
He is doing a great job with weight reduction. I am encouraged!  He is clear in his decision to decline sleep study

## 2012-02-27 ENCOUNTER — Ambulatory Visit (HOSPITAL_COMMUNITY): Payer: Medicaid Other | Attending: Cardiology

## 2012-02-27 DIAGNOSIS — I509 Heart failure, unspecified: Secondary | ICD-10-CM | POA: Insufficient documentation

## 2012-02-27 DIAGNOSIS — E669 Obesity, unspecified: Secondary | ICD-10-CM | POA: Insufficient documentation

## 2012-02-27 DIAGNOSIS — Z87891 Personal history of nicotine dependence: Secondary | ICD-10-CM | POA: Insufficient documentation

## 2012-02-27 DIAGNOSIS — I059 Rheumatic mitral valve disease, unspecified: Secondary | ICD-10-CM | POA: Insufficient documentation

## 2012-02-27 DIAGNOSIS — I4891 Unspecified atrial fibrillation: Secondary | ICD-10-CM | POA: Insufficient documentation

## 2012-02-27 NOTE — Progress Notes (Signed)
Echocardiogram performed.  

## 2012-05-28 ENCOUNTER — Ambulatory Visit: Payer: Medicaid Other | Admitting: Internal Medicine

## 2012-06-04 ENCOUNTER — Ambulatory Visit (INDEPENDENT_AMBULATORY_CARE_PROVIDER_SITE_OTHER): Payer: Medicaid Other | Admitting: Internal Medicine

## 2012-06-04 ENCOUNTER — Encounter: Payer: Self-pay | Admitting: Internal Medicine

## 2012-06-04 VITALS — BP 108/73 | HR 90 | Ht 71.0 in | Wt 263.8 lb

## 2012-06-04 DIAGNOSIS — I5022 Chronic systolic (congestive) heart failure: Secondary | ICD-10-CM

## 2012-06-04 DIAGNOSIS — I4891 Unspecified atrial fibrillation: Secondary | ICD-10-CM

## 2012-06-04 LAB — BASIC METABOLIC PANEL
BUN: 15 mg/dL (ref 6–23)
Calcium: 9.5 mg/dL (ref 8.4–10.5)
GFR: 81.26 mL/min (ref >60.00)
Potassium: 4.3 mEq/L (ref 3.5–5.1)
Sodium: 137 mEq/L (ref 135–145)

## 2012-06-04 MED ORDER — FUROSEMIDE 20 MG PO TABS: 20.0000 mg | ORAL_TABLET | Freq: Every day | ORAL | Status: DC

## 2012-06-04 MED ORDER — DABIGATRAN ETEXILATE MESYLATE 150 MG PO CAPS: 150.0000 mg | ORAL_CAPSULE | Freq: Two times a day (BID) | ORAL | Status: DC

## 2012-06-04 MED ORDER — SILDENAFIL CITRATE 100 MG PO TABS: 100.0000 mg | ORAL_TABLET | Freq: Every day | ORAL | Status: DC | PRN

## 2012-06-04 MED ORDER — SPIRONOLACTONE 25 MG PO TABS: 25.0000 mg | ORAL_TABLET | Freq: Every day | ORAL | Status: DC

## 2012-06-04 MED ORDER — METOPROLOL TARTRATE 50 MG PO TABS: 75.0000 mg | ORAL_TABLET | Freq: Two times a day (BID) | ORAL | Status: DC

## 2012-06-04 NOTE — Assessment & Plan Note (Signed)
Rate controlled Continue long term anticoagulation with pradaxa

## 2012-06-04 NOTE — Assessment & Plan Note (Signed)
Much improved (EF recovered) with rate control of his afib No changes today

## 2012-06-04 NOTE — Assessment & Plan Note (Signed)
He has lost 62 lbs Doing great.  I have very encouraged  He declines sleep study which was previously ordered   Followup with Norma Fredrickson in 3 months I will see again in 6 months

## 2012-06-04 NOTE — Patient Instructions (Addendum)
Your physician wants you to follow-up in: 3 months with Norma Fredrickson, NP and 6 months with Dr Jacquiline Doe will receive a reminder letter in the mail two months in advance. If you don't receive a letter, please call our office to schedule the follow-up appointment.   Your physician recommends that you return for lab work today:  BMP

## 2012-06-04 NOTE — Progress Notes (Signed)
PCP:  [name], MD  The patient presents today for routine electrophysiology followup.  Since his recent hospitalization, the patient reports doing very well.  He has lost an additional 22 pounds since his last visit.  That's 62 lbs since November!.   He reports significant improvement in symptoms of CHF with rate control.  His swelling and SOB are resolved.  He is exercising regularly without difficulty.  Today, he denies symptoms of palpitations, chest pain, orthopnea, PND,dizziness, presyncope, syncope, or neurologic sequela.  The patient feels that he is tolerating medications without difficulties and is otherwise without complaint today.   Past Medical History  Diagnosis Date  . Chicken pox as a child  . Migraine 06/29/2011  . Obesity 06/29/2011  . Depression with anxiety 06/29/2011  . Atrial fibrillation     admx 11/13 with acute sCHF in setting of RVR  => a. failed DCCV x 2; b. Pradaxa started;  c. failed sotalol  . Chronic systolic heart failure     a. echo 11/13: Ef 40-45%, diff HK, mod MR, mod LAE, mild RVE, mod RAE, small effusion;   b. TEE 11/13:  EF 35-40%, no LAA clot  . Cardiomyopathy     likely tachy mediated in setting of AF with RVR  . Snoring     patient needs sleep study  . Allergy to IVP dye    Past Surgical History  Procedure Laterality Date  . Tee without cardioversion  11/30/2011    Procedure: TRANSESOPHAGEAL ECHOCARDIOGRAM (TEE);  Surgeon: Vesta Mixer, MD;  Location: [facility] ENDOSCOPY;  Service: Cardiovascular;  Laterality: N/A;  . Cardioversion  11/30/2011    Procedure: CARDIOVERSION;  Surgeon: Vesta Mixer, MD;  Location: [facility] ENDOSCOPY;  Service: Cardiovascular;  Laterality: N/A;  . Cardioversion  12/03/2011    Procedure: CARDIOVERSION;  Surgeon: Dolores Patty, MD;  Location: [facility] OR;  Service: Cardiovascular;  Laterality: N/A;    Current Outpatient Prescriptions  Medication Sig Dispense Refill  . dabigatran (PRADAXA) 150 MG CAPS Take 1 capsule (150  mg total) by mouth every 12 (twelve) hours.  60 capsule  11  . furosemide (LASIX) 20 MG tablet Take 1 tablet (20 mg total) by mouth daily.  30 tablet  11  . metoprolol (LOPRESSOR) 50 MG tablet Take 1.5 tablets (75 mg total) by mouth 2 (two) times daily.  90 tablet  11  . sildenafil (VIAGRA) 100 MG tablet Take 1 tablet (100 mg total) by mouth daily as needed for erectile dysfunction.  10 tablet  3  . spironolactone (ALDACTONE) 25 MG tablet Take 1 tablet (25 mg total) by mouth daily.  30 tablet  11   No current facility-administered medications for this visit.    Allergies  Allergen Reactions  . Contrast Media (Iodinated Diagnostic Agents)     a diffuse macular rash after CTA chest     History   Social History  . Marital Status: Divorced    Spouse Name: N/A    Number of Children: N/A  . Years of Education: N/A   Occupational History  . Not on file.   Social History Main Topics  . Smoking status: Former Smoker -- 1.00 packs/day for 20 years    Types: Cigarettes    Quit date: 01/18/2000  . Smokeless tobacco: Never Used  . Alcohol Use: No  . Drug Use: No  . Sexually Active: No   Other Topics Concern  . Not on file   Social History Narrative  . No narrative on  file    Family History  Problem Relation Age of Onset  . Hypertension Mother   . Hypertension Father   . Aneurysm Father   . Alcohol abuse Father   . Cancer Maternal Grandmother     brain  . Diabetes Maternal Grandfather     type 2  . Parkinsonism Maternal Grandfather   . Cancer Paternal Grandmother     breast  . Diabetes Paternal Grandmother   . Alcohol abuse Paternal Grandfather     ROS-  All systems are reviewed and are negative except as outlined in the HPI above   Physical Exam: Filed Vitals:   06/04/12 0907  BP: 108/73  Pulse: 90  Height: 5\' 11"  (1.803 m)  Weight: 263 lb 12.8 oz (119.659 kg)    GEN- The patient is well appearing, alert and oriented x 3 today.   Head- normocephalic,  atraumatic Eyes-  Sclera clear, conjunctiva pink Ears- hearing intact Oropharynx- clear Neck- supple, no JVP Lymph- no cervical lymphadenopathy Lungs- Clear to ausculation bilaterally, normal work of breathing Heart- irregular rate and rhythm, no murmurs, rubs or gallops, PMI not laterally displaced GI- soft, NT, ND, + BS Extremities- no clubbing, cyanosis, no edema MS- no significant deformity or atrophy Skin- no rash or lesion Psych- euthymic mood, full affect Neuro- strength and sensation are intact  ekg today reveals afib, V rate 78 bpm, nonspecific ST/ T changes, poor R wave progression  Assessment and Plan:

## 2012-09-04 ENCOUNTER — Encounter: Payer: Self-pay | Admitting: Nurse Practitioner

## 2012-09-04 ENCOUNTER — Ambulatory Visit (INDEPENDENT_AMBULATORY_CARE_PROVIDER_SITE_OTHER): Payer: Medicaid Other | Admitting: Nurse Practitioner

## 2012-09-04 VITALS — BP 100/80 | HR 64 | Ht 70.0 in | Wt 263.8 lb

## 2012-09-04 DIAGNOSIS — Z7901 Long term (current) use of anticoagulants: Secondary | ICD-10-CM

## 2012-09-04 DIAGNOSIS — I4891 Unspecified atrial fibrillation: Secondary | ICD-10-CM

## 2012-09-04 LAB — CBC WITH DIFFERENTIAL/PLATELET
Basophils Absolute: 0 10*3/uL (ref 0.0–0.1)
Basophils Relative: 0.4 % (ref 0.0–3.0)
Eosinophils Absolute: 0.3 10*3/uL (ref 0.0–0.7)
Eosinophils Relative: 3.1 % (ref 0.0–5.0)
HCT: 52.9 % — ABNORMAL HIGH (ref 39.0–52.0)
Hemoglobin: 17.9 g/dL — ABNORMAL HIGH (ref 13.0–17.0)
Lymphocytes Relative: 21.5 % (ref 12.0–46.0)
Lymphs Abs: 2.3 10*3/uL (ref 0.7–4.0)
MCHC: 33.8 g/dL (ref 30.0–36.0)
MCV: 95.7 fl (ref 78.0–100.0)
Monocytes Absolute: 1 10*3/uL (ref 0.1–1.0)
Monocytes Relative: 9.4 % (ref 3.0–12.0)
Neutro Abs: 6.9 10*3/uL (ref 1.4–7.7)
Neutrophils Relative %: 65.6 % (ref 43.0–77.0)
Platelets: 272 10*3/uL (ref 150.0–400.0)
RBC: 5.52 Mil/uL (ref 4.22–5.81)
RDW: 14.3 % (ref 11.5–14.6)
WBC: 10.5 10*3/uL (ref 4.5–10.5)

## 2012-09-04 LAB — HEPATIC FUNCTION PANEL
ALT: 16 U/L (ref 0–53)
AST: 17 U/L (ref 0–37)
Albumin: 3.6 g/dL (ref 3.5–5.2)
Alkaline Phosphatase: 53 U/L (ref 39–117)
Bilirubin, Direct: 0 mg/dL (ref 0.0–0.3)
Total Bilirubin: 0.3 mg/dL (ref 0.3–1.2)
Total Protein: 7.1 g/dL (ref 6.0–8.3)

## 2012-09-04 LAB — BASIC METABOLIC PANEL
BUN: 11 mg/dL (ref 6–23)
CO2: 26 mEq/L (ref 19–32)
Calcium: 9 mg/dL (ref 8.4–10.5)
Chloride: 105 mEq/L (ref 96–112)
Creatinine, Ser: 1 mg/dL (ref 0.4–1.5)
GFR: 91.1 mL/min (ref >60.00)
Glucose, Bld: 93 mg/dL (ref 70–99)
Potassium: 3.9 mEq/L (ref 3.5–5.1)
Sodium: 138 mEq/L (ref 135–145)

## 2012-09-04 LAB — LIPID PANEL
Cholesterol: 113 mg/dL (ref 0–200)
HDL: 35.9 mg/dL — ABNORMAL LOW (ref >39.00)
LDL Cholesterol: 68 mg/dL (ref 0–99)
Total CHOL/HDL Ratio: 3
Triglycerides: 45 mg/dL (ref 0.0–149.0)
VLDL: 9 mg/dL (ref 0.0–40.0)

## 2012-09-04 MED ORDER — FUROSEMIDE 20 MG PO TABS: ORAL_TABLET | ORAL | Status: DC

## 2012-09-04 NOTE — Progress Notes (Signed)
[name] Date of Birth: 1965/07/17 Medical Record #[id number]  History of Present Illness: William Fischer is seen back today for a 3 month check. Seen for Dr. Johney Frame. He has AF with failed DCCV x 2, maintained on chronic anticoagulation. Has systolic CHF with EF 40 to 45% back in 2013 per echo and 35 to 40% per TEE in November of 2013. EF recovered with control of his heart rate and there was concern that his CM was probably tachycardia mediated. Other issues include morbid obesity, ED, HTN and depression with anxiety. He has declined sleep study evaluation.   Last seen here in May - had been actively losing weight.   Comes back today. Here with his mother. Doing well. No chest pain. Not short of breath. No swelling. Watching his salt. Has stopped exercising - "got lazy". Has had more dizzy spells and feels more "fainty" if he gets up too quickly. No bleeding issues. He is fasting today.   Current Outpatient Prescriptions  Medication Sig Dispense Refill  . dabigatran (PRADAXA) 150 MG CAPS Take 1 capsule (150 mg total) by mouth every 12 (twelve) hours.  60 capsule  11  . furosemide (LASIX) 20 MG tablet To take for weight gain of 2 to 3 pounds overnight, swelling, etc.  30 tablet  11  . metoprolol (LOPRESSOR) 50 MG tablet Take 1.5 tablets (75 mg total) by mouth 2 (two) times daily.  90 tablet  11  . sildenafil (VIAGRA) 100 MG tablet Take 1 tablet (100 mg total) by mouth daily as needed for erectile dysfunction.  10 tablet  3  . spironolactone (ALDACTONE) 25 MG tablet Take 1 tablet (25 mg total) by mouth daily.  30 tablet  11   No current facility-administered medications for this visit.    Allergies  Allergen Reactions  . Contrast Media [Iodinated Diagnostic Agents]     a diffuse macular rash after CTA chest     Past Medical History  Diagnosis Date  . Chicken pox as a child  . Migraine 06/29/2011  . Obesity 06/29/2011  . Depression with anxiety 06/29/2011  . Atrial fibrillation     admx  11/13 with acute sCHF in setting of RVR  => a. failed DCCV x 2; b. Pradaxa started;  c. failed sotalol  . Chronic systolic heart failure     a. echo 11/13: Ef 40-45%, diff HK, mod MR, mod LAE, mild RVE, mod RAE, small effusion;   b. TEE 11/13:  EF 35-40%, no LAA clot; Echo 2/14 shows normal EF  . Cardiomyopathy     likely tachy mediated in setting of AF with RVR  . Snoring     patient needs sleep study - has declined  . Allergy to IVP dye   . Chronic anticoagulation     Pradaxa    Past Surgical History  Procedure Laterality Date  . Tee without cardioversion  11/30/2011    Procedure: TRANSESOPHAGEAL ECHOCARDIOGRAM (TEE);  Surgeon: Vesta Mixer, MD;  Location: [facility] ENDOSCOPY;  Service: Cardiovascular;  Laterality: N/A;  . Cardioversion  11/30/2011    Procedure: CARDIOVERSION;  Surgeon: Vesta Mixer, MD;  Location: [facility] ENDOSCOPY;  Service: Cardiovascular;  Laterality: N/A;  . Cardioversion  12/03/2011    Procedure: CARDIOVERSION;  Surgeon: Dolores Patty, MD;  Location: [facility] OR;  Service: Cardiovascular;  Laterality: N/A;    History  Smoking status  . Former Smoker -- 1.00 packs/day for 20 years  . Types: Cigarettes  . Quit date: 01/18/2000  Smokeless tobacco  . Never Used    History  Alcohol Use No    Family History  Problem Relation Age of Onset  . Hypertension Mother   . Hypertension Father   . Aneurysm Father   . Alcohol abuse Father   . Cancer Maternal Grandmother     brain  . Diabetes Maternal Grandfather     type 2  . Parkinsonism Maternal Grandfather   . Cancer Paternal Grandmother     breast  . Diabetes Paternal Grandmother   . Alcohol abuse Paternal Grandfather     Review of Systems: The review of systems is per the HPI.  All other systems were reviewed and are negative.  Physical Exam: BP 100/80  Pulse 64  Ht 5\' 10"  (1.778 m)  Wt 263 lb 12.8 oz (119.659 kg)  BMI 37.85 kg/m2 Patient is very pleasant and in no acute distress. Remains obese.  Weight is unchanged. Skin is warm and dry. Color is normal.  HEENT is unremarkable. Normocephalic/atraumatic. PERRL. Sclera are nonicteric. Neck is supple. No masses. No JVD. Lungs are clear. Cardiac exam shows an irregular rhythm. Rate is well controlled. Abdomen is soft. Extremities are without edema. Gait and ROM are intact. No gross neurologic deficits noted.  LABORATORY DATA:  Lab Results  Component Value Date   WBC 11.3* 02/20/2012   HGB 17.9* 02/20/2012   HCT 54.2* 02/20/2012   PLT 277.0 02/20/2012   GLUCOSE 114* 06/04/2012   CHOL 138 11/28/2011   TRIG 50 11/28/2011   HDL 31* 11/28/2011   LDLCALC 97 11/28/2011   ALT 55* 11/27/2011   AST 31 11/27/2011   NA 137 06/04/2012   K 4.3 06/04/2012   CL 101 06/04/2012   CREATININE 1.1 06/04/2012   BUN 15 06/04/2012   CO2 27 06/04/2012   TSH 1.790 11/27/2011   INR 1.20 11/28/2011   HGBA1C 6.2* 11/27/2011   Echo Study Conclusions from February 2014  - Left ventricle: The cavity size was mildly dilated. Wall thickness was increased in a pattern of mild LVH. Systolic function was normal. The estimated ejection fraction was in the range of 50% to 55%. Wall motion was normal; there were no regional wall motion abnormalities. - Mitral valve: Mild regurgitation. - Left atrium: The atrium was moderately dilated. - Right atrium: The atrium was mildly dilated.    Assessment / Plan:  1. Cardiomyopathy - EF has recovered with control of his heart rate - he looks very compensated. I have changed his Lasix to only "prn" due to his dizziness and soft BP. He will use for weight gain, swelling, etc. Will tentatively see him back in February as planned.   2. Atrial fib - maintained on chronic Pradaxa and rate control - rate is ok today by exam.   3. Obesity - needs to get back to his exercise program.   We will check follow up labs today.   Patient is agreeable to this plan and will call if any problems develop in the interim.   Rosalio Macadamia, RN,  ANP-C  [facility] [address] [place], [place]  [address]

## 2012-09-04 NOTE — Patient Instructions (Addendum)
Dr. Johney Frame will see you back in February of 2015  Get back to exercising and being active  Continue with your current medicines except take the Lasix only as needed - for weight gain of 2 to 3 pounds overnight, swelling, etc. - this should help your dizziness - if it does not - let us know.   We need to check follow up labs today  Call the [facility] office at [phone] if you have any questions, problems or concerns.

## 2013-03-06 ENCOUNTER — Ambulatory Visit (INDEPENDENT_AMBULATORY_CARE_PROVIDER_SITE_OTHER): Payer: Medicaid Other | Admitting: Internal Medicine

## 2013-03-06 ENCOUNTER — Encounter: Payer: Self-pay | Admitting: Internal Medicine

## 2013-03-06 VITALS — BP 118/78 | HR 112 | Ht 70.0 in | Wt 276.0 lb

## 2013-03-06 DIAGNOSIS — I4891 Unspecified atrial fibrillation: Secondary | ICD-10-CM

## 2013-03-06 NOTE — Patient Instructions (Addendum)
Your physician recommends that you schedule a follow-up appointment in: 4 weeks with Dr Rayann Heman  Your physician has requested that you have an echocardiogram. Echocardiography is a painless test that uses sound waves to create images of your heart. It provides your doctor with information about the size and shape of your heart and how well your heart's chambers and valves are working. This procedure takes approximately one hour. There are no restrictions for this procedure.   TAKE YOUR MEDICATIONS

## 2013-03-06 NOTE — Progress Notes (Signed)
PCP:  Penni Homans, MD  The patient presents today for routine electrophysiology followup.  Since his last visit, the patient reports doing very well.  Unfortunately, he has begun to gain weight back and has recenlty been noncompliant with his medicines.   Today, he denies symptoms of palpitations, chest pain, SOB, orthopnea, PND,dizziness, presyncope, syncope, or neurologic sequela.  The patient feels that he is tolerating medications without difficulties and is otherwise without complaint today.   Past Medical History  Diagnosis Date  . Chicken pox as a child  . Migraine 06/29/2011  . Obesity 06/29/2011  . Depression with anxiety 06/29/2011  . Atrial fibrillation     admx 11/13 with acute sCHF in setting of RVR  => a. failed DCCV x 2; b. Pradaxa started;  c. failed sotalol  . Chronic systolic heart failure     a. echo 11/13: Ef 40-45%, diff HK, mod MR, mod LAE, mild RVE, mod RAE, small effusion;   b. TEE 11/13:  EF 35-40%, no LAA clot; Echo 2/14 shows normal EF  . Cardiomyopathy     likely tachy mediated in setting of AF with RVR  . Snoring     patient needs sleep study - has declined  . Allergy to IVP dye   . Chronic anticoagulation     Pradaxa   Past Surgical History  Procedure Laterality Date  . Tee without cardioversion  11/30/2011    Procedure: TRANSESOPHAGEAL ECHOCARDIOGRAM (TEE);  Surgeon: Thayer Headings, MD;  Location: [place];  Service: Cardiovascular;  Laterality: N/A;  . Cardioversion  11/30/2011    Procedure: CARDIOVERSION;  Surgeon: Thayer Headings, MD;  Location: [place];  Service: Cardiovascular;  Laterality: N/A;  . Cardioversion  12/03/2011    Procedure: CARDIOVERSION;  Surgeon: Jolaine Artist, MD;  Location: [facility] OR;  Service: Cardiovascular;  Laterality: N/A;    Current Outpatient Prescriptions  Medication Sig Dispense Refill  . dabigatran (PRADAXA) 150 MG CAPS Take 1 capsule (150 mg total) by mouth every 12 (twelve) hours.  60 capsule  11  .  furosemide (LASIX) 20 MG tablet Take 20 mg by mouth as needed. To take for weight gain of 2 to 3 pounds overnight, swelling, etc.      . metoprolol (LOPRESSOR) 50 MG tablet Take 1.5 tablets (75 mg total) by mouth 2 (two) times daily.  90 tablet  11  . spironolactone (ALDACTONE) 25 MG tablet Take 1 tablet (25 mg total) by mouth daily.  30 tablet  11   No current facility-administered medications for this visit.    Allergies  Allergen Reactions  . Contrast Media [Iodinated Diagnostic Agents]     a diffuse macular rash after CTA chest     History   Social History  . Marital Status: Divorced    Spouse Name: N/A    Number of Children: N/A  . Years of Education: N/A   Occupational History  . Not on file.   Social History Main Topics  . Smoking status: Former Smoker -- 1.00 packs/day for 20 years    Types: Cigarettes    Quit date: 01/18/2000  . Smokeless tobacco: Never Used  . Alcohol Use: No  . Drug Use: No  . Sexual Activity: No   Other Topics Concern  . Not on file   Social History Narrative  . No narrative on file    Family History  Problem Relation Age of Onset  . Hypertension Mother   . Hypertension Father   . Aneurysm  Father   . Alcohol abuse Father   . Cancer Maternal Grandmother     brain  . Diabetes Maternal Grandfather     type 2  . Parkinsonism Maternal Grandfather   . Cancer Paternal Grandmother     breast  . Diabetes Paternal Grandmother   . Alcohol abuse Paternal Grandfather     ROS-  All systems are reviewed and are negative except as outlined in the HPI above   Physical Exam: Filed Vitals:   03/06/13 1456  BP: 118/78  Pulse: 112  Height: 5\' 10"  (1.778 m)  Weight: 276 lb (125.193 kg)    GEN- The patient is well appearing, alert and oriented x 3 today.   Head- normocephalic, atraumatic Eyes-  Sclera clear, conjunctiva pink Ears- hearing intact Oropharynx- clear Neck- supple, no JVP Lymph- no cervical lymphadenopathy Lungs- Clear to  ausculation bilaterally, normal work of breathing Heart- irregular rate and rhythm, no murmurs, rubs or gallops, PMI not laterally displaced GI- soft, NT, ND, + BS Extremities- no clubbing, cyanosis, no edema MS- no significant deformity or atrophy Skin- no rash or lesion Psych- euthymic mood, full affect Neuro- strength and sensation are intact  ekg today reveals afib, V rate 112 bpm, nonspecific ST/ T changes, poor R wave progression  Assessment and Plan:  1. Longstanding persistent afib He has a h/o prolonged afib.  He has failed medical therapy with sotalol.  Given severe LA enlargement I have previously treated with rate control and he has done well.  His CHADS2VASC score is at least 1.  He is anticoagulated with pradaxa but has only been intermittently compliant recently.  The importance of compliance was reinforced today.  I will repeat an echo at this time.  It may be necessary to revisit the possibility of rhythm control.  I will have him return in 4 weeks for follow-up.  Given his LA enlargement and longstanding persistent afib, I think that either a surgical or hybrid approach may be required long term.  2. Nonischemic CM Previously his EF recovered with rate control Repeat echo at this time  3. Obesity Weight loss advised He has declined sleep study  4. Tobacco  Cessation is advised

## 2013-03-27 ENCOUNTER — Ambulatory Visit (HOSPITAL_COMMUNITY): Payer: Medicaid Other | Attending: Internal Medicine | Admitting: Cardiology

## 2013-03-27 DIAGNOSIS — I5022 Chronic systolic (congestive) heart failure: Secondary | ICD-10-CM

## 2013-03-27 DIAGNOSIS — I5021 Acute systolic (congestive) heart failure: Secondary | ICD-10-CM

## 2013-03-27 DIAGNOSIS — I4891 Unspecified atrial fibrillation: Secondary | ICD-10-CM | POA: Insufficient documentation

## 2013-03-27 DIAGNOSIS — I5023 Acute on chronic systolic (congestive) heart failure: Secondary | ICD-10-CM | POA: Insufficient documentation

## 2013-03-27 NOTE — Progress Notes (Signed)
Echo performed. 

## 2013-04-15 ENCOUNTER — Ambulatory Visit (INDEPENDENT_AMBULATORY_CARE_PROVIDER_SITE_OTHER): Payer: Medicaid Other | Admitting: Internal Medicine

## 2013-04-15 ENCOUNTER — Encounter: Payer: Self-pay | Admitting: Internal Medicine

## 2013-04-15 VITALS — BP 115/76 | HR 63 | Ht 70.5 in | Wt 282.0 lb

## 2013-04-15 DIAGNOSIS — I4891 Unspecified atrial fibrillation: Secondary | ICD-10-CM

## 2013-04-15 DIAGNOSIS — I5022 Chronic systolic (congestive) heart failure: Secondary | ICD-10-CM

## 2013-04-15 DIAGNOSIS — E669 Obesity, unspecified: Secondary | ICD-10-CM

## 2013-04-15 NOTE — Patient Instructions (Signed)
Your physician recommends that you schedule a follow-up appointment in: 3 months with Lori Gerhardt, NP and 6 months with Dr Allred  

## 2013-04-15 NOTE — Progress Notes (Signed)
PCP:  Penni Homans, MD  The patient presents today for routine electrophysiology followup.  Since his last visit, the patient reports doing very well.  He is feeling "much better" with medicine compliance.   Today, he denies symptoms of fatigue, palpitations, chest pain, SOB, orthopnea, PND,dizziness, presyncope, syncope, or neurologic sequela.  The patient feels that he is tolerating medications without difficulties and is otherwise without complaint today.   Past Medical History  Diagnosis Date  . Chicken pox as a child  . Migraine 06/29/2011  . Obesity 06/29/2011  . Depression with anxiety 06/29/2011  . Atrial fibrillation     admx 11/13 with acute sCHF in setting of RVR  => a. failed DCCV x 2; b. Pradaxa started;  c. failed sotalol  . Chronic systolic heart failure     a. echo 11/13: Ef 40-45%, diff HK, mod MR, mod LAE, mild RVE, mod RAE, small effusion;   b. TEE 11/13:  EF 35-40%, no LAA clot; Echo 2/14 shows normal EF  . Cardiomyopathy     likely tachy mediated in setting of AF with RVR  . Snoring     patient needs sleep study - has declined  . Allergy to IVP dye   . Chronic anticoagulation     Pradaxa   Past Surgical History  Procedure Laterality Date  . Tee without cardioversion  11/30/2011    Procedure: TRANSESOPHAGEAL ECHOCARDIOGRAM (TEE);  Surgeon: Thayer Headings, MD;  Location: [place];  Service: Cardiovascular;  Laterality: N/A;  . Cardioversion  11/30/2011    Procedure: CARDIOVERSION;  Surgeon: Thayer Headings, MD;  Location: [place];  Service: Cardiovascular;  Laterality: N/A;  . Cardioversion  12/03/2011    Procedure: CARDIOVERSION;  Surgeon: Jolaine Artist, MD;  Location: [facility] OR;  Service: Cardiovascular;  Laterality: N/A;    Current Outpatient Prescriptions  Medication Sig Dispense Refill  . dabigatran (PRADAXA) 150 MG CAPS Take 1 capsule (150 mg total) by mouth every 12 (twelve) hours.  60 capsule  11  . furosemide (LASIX) 20 MG tablet Take 20 mg by  mouth as needed. To take for weight gain of 2 to 3 pounds overnight, swelling, etc.      . metoprolol (LOPRESSOR) 50 MG tablet Take 1.5 tablets (75 mg total) by mouth 2 (two) times daily.  90 tablet  11  . spironolactone (ALDACTONE) 25 MG tablet Take 1 tablet (25 mg total) by mouth daily.  30 tablet  11   No current facility-administered medications for this visit.    Allergies  Allergen Reactions  . Contrast Media [Iodinated Diagnostic Agents]     a diffuse macular rash after CTA chest     History   Social History  . Marital Status: Divorced    Spouse Name: N/A    Number of Children: N/A  . Years of Education: N/A   Occupational History  . Not on file.   Social History Main Topics  . Smoking status: Former Smoker -- 1.00 packs/day for 20 years    Types: Cigarettes    Quit date: 01/18/2000  . Smokeless tobacco: Never Used  . Alcohol Use: No  . Drug Use: No  . Sexual Activity: No   Other Topics Concern  . Not on file   Social History Narrative  . No narrative on file    Family History  Problem Relation Age of Onset  . Hypertension Mother   . Hypertension Father   . Aneurysm Father   . Alcohol abuse Father   .  Cancer Maternal Grandmother     brain  . Diabetes Maternal Grandfather     type 2  . Parkinsonism Maternal Grandfather   . Cancer Paternal Grandmother     breast  . Diabetes Paternal Grandmother   . Alcohol abuse Paternal Grandfather     ROS-  All systems are reviewed and are negative except as outlined in the HPI above   Physical Exam: Filed Vitals:   04/15/13 1113  BP: 115/76  Pulse: 63  Height: 5' 10.5" (1.791 m)  Weight: 282 lb (127.914 kg)    GEN- The patient is well appearing, alert and oriented x 3 today.   Head- normocephalic, atraumatic Eyes-  Sclera clear, conjunctiva pink Ears- hearing intact Oropharynx- clear Neck- supple, no JVP Lymph- no cervical lymphadenopathy Lungs- Clear to ausculation bilaterally, normal work of  breathing Heart- irregular rate and rhythm, no murmurs, rubs or gallops, PMI not laterally displaced GI- soft, NT, ND, + BS Extremities- no clubbing, cyanosis, no edema MS- no significant deformity or atrophy Skin- no rash or lesion Psych- euthymic mood, full affect Neuro- strength and sensation are intact  ekg today reveals afib, V rate 63 bpm, nonspecific ST/ T changes, poor R wave progression Echo 3/15 reviewed with patient today  Assessment and Plan:  1. Longstanding persistent afib He has a h/o prolonged afib. As long as he takes his medicines, he is asymptomatic.  He has failed rhythm control therapy with sotalol previously. Given severe LA enlargement I have previously treated with rate control and he has done well.  His CHADS2VASC score is at least 1.  He is anticoagulated with pradaxa and now reports compliance.  For now, we will continue rate control.  2. Nonischemic CM His EF recovered with rate control  3. Obesity Weight loss advised He has declined sleep study  4. Tobacco  Cessation is advised

## 2013-06-08 ENCOUNTER — Other Ambulatory Visit: Payer: Self-pay | Admitting: Internal Medicine

## 2013-07-16 ENCOUNTER — Encounter: Payer: Self-pay | Admitting: Nurse Practitioner

## 2013-07-16 ENCOUNTER — Ambulatory Visit (INDEPENDENT_AMBULATORY_CARE_PROVIDER_SITE_OTHER): Payer: Medicaid Other | Admitting: Nurse Practitioner

## 2013-07-16 VITALS — BP 120/80 | HR 77 | Ht 70.5 in | Wt 291.0 lb

## 2013-07-16 DIAGNOSIS — Z7901 Long term (current) use of anticoagulants: Secondary | ICD-10-CM

## 2013-07-16 DIAGNOSIS — I10 Essential (primary) hypertension: Secondary | ICD-10-CM

## 2013-07-16 DIAGNOSIS — I4891 Unspecified atrial fibrillation: Secondary | ICD-10-CM

## 2013-07-16 DIAGNOSIS — R635 Abnormal weight gain: Secondary | ICD-10-CM

## 2013-07-16 DIAGNOSIS — I482 Chronic atrial fibrillation, unspecified: Secondary | ICD-10-CM

## 2013-07-16 LAB — LIPID PANEL
Cholesterol: 149 mg/dL (ref 0–200)
HDL: 46.2 mg/dL (ref >39.00)
LDL Cholesterol: 87 mg/dL (ref 0–99)
NonHDL: 102.8
Total CHOL/HDL Ratio: 3
Triglycerides: 80 mg/dL (ref 0.0–149.0)
VLDL: 16 mg/dL (ref 0.0–40.0)

## 2013-07-16 LAB — BASIC METABOLIC PANEL
BUN: 14 mg/dL (ref 6–23)
CO2: 27 mEq/L (ref 19–32)
Calcium: 9.5 mg/dL (ref 8.4–10.5)
Chloride: 102 mEq/L (ref 96–112)
Creatinine, Ser: 1 mg/dL (ref 0.4–1.5)
GFR: 86.54 mL/min (ref >60.00)
Glucose, Bld: 106 mg/dL — ABNORMAL HIGH (ref 70–99)
Potassium: 4.5 mEq/L (ref 3.5–5.1)
Sodium: 137 mEq/L (ref 135–145)

## 2013-07-16 LAB — HEPATIC FUNCTION PANEL
ALT: 29 U/L (ref 0–53)
AST: 23 U/L (ref 0–37)
Albumin: 4.2 g/dL (ref 3.5–5.2)
Alkaline Phosphatase: 60 U/L (ref 39–117)
Bilirubin, Direct: 0.1 mg/dL (ref 0.0–0.3)
Total Bilirubin: 0.5 mg/dL (ref 0.2–1.2)
Total Protein: 7.9 g/dL (ref 6.0–8.3)

## 2013-07-16 LAB — CBC
HCT: 53.5 % — ABNORMAL HIGH (ref 39.0–52.0)
Hemoglobin: 18.2 g/dL (ref 13.0–17.0)
MCHC: 34.1 g/dL (ref 30.0–36.0)
MCV: 97.2 fl (ref 78.0–100.0)
Platelets: 299 10*3/uL (ref 150.0–400.0)
RBC: 5.51 Mil/uL (ref 4.22–5.81)
RDW: 15.3 % (ref 11.5–15.5)
WBC: 11.1 10*3/uL — ABNORMAL HIGH (ref 4.0–10.5)

## 2013-07-16 LAB — TSH: TSH: 2.14 u[IU]/mL (ref 0.35–4.50)

## 2013-07-16 NOTE — Progress Notes (Signed)
William Fischer Date of Birth: 02/10/1965 Medical Record #[id number]  History of Present Illness: William Fischer is seen back today for a 3 month check. Seen for Dr. Rayann Heman. He has AF with failed DCCV x 2, maintained on chronic anticoagulation. Has systolic CHF with EF 40 to 45% back in 2013 per echo and 35 to 40% per TEE in November of 2013. EF recovered with control of his heart rate and there was concern that his CM was probably tachycardia mediated. Other issues include morbid obesity, ED, HTN and depression with anxiety. He has declined sleep study evaluation.   I saw him back in August of 2014. Was doing ok for the most part.   Saw Dr. Rayann Heman back in March - was doing ok. He was noted to "have failed rhythm control therapy with sotalol previously. Given severe LA enlargement I have previously treated with rate control and he has done well. His CHADS2VASC score is at least 1. He is anticoagulated with pradaxa and now reports compliance. For now, we will continue rate control".  Comes back today.  Here alone. Has started smoking some - "just puffing". Says he has had lots of bad news and because of his divorce (from 7 years ago). Not exercising. Tells me his eating is pretty bad. He has gained almost 30 pounds. Then he tells me that he just doesn't know how he has gained this weight. No chest pain. Breathing ok. No palpitations. Tolerating his medicines. No real problems noted.    Current Outpatient Prescriptions  Medication Sig Dispense Refill  . furosemide (LASIX) 20 MG tablet TAKE 1 TABLET BY MOUTH EVERY DAY  30 tablet  3  . metoprolol (LOPRESSOR) 50 MG tablet TAKE 1.5 TABLETS (75 MG TOTAL) BY MOUTH 2 (TWO) TIMES DAILY.  90 tablet  3  . PRADAXA 150 MG CAPS capsule TAKE 1 CAPSULE BY MOUTH EVERY 12 HOURS  60 capsule  3  . spironolactone (ALDACTONE) 25 MG tablet TAKE 1 TABLET BY MOUTH EVERY DAY  30 tablet  3   No current facility-administered medications for this visit.    Allergies  Allergen  Reactions  . Contrast Media [Iodinated Diagnostic Agents]     a diffuse macular rash after CTA chest     Past Medical History  Diagnosis Date  . Chicken pox as a child  . Migraine 06/29/2011  . Obesity 06/29/2011  . Depression with anxiety 06/29/2011  . Atrial fibrillation     admx 11/13 with acute sCHF in setting of RVR  => a. failed DCCV x 2; b. Pradaxa started;  c. failed sotalol  . Chronic systolic heart failure     a. echo 11/13: Ef 40-45%, diff HK, mod MR, mod LAE, mild RVE, mod RAE, small effusion;   b. TEE 11/13:  EF 35-40%, no LAA clot; Echo 2/14 shows normal EF  . Cardiomyopathy     likely tachy mediated in setting of AF with RVR  . Snoring     patient needs sleep study - has declined  . Allergy to IVP dye   . Chronic anticoagulation     Pradaxa    Past Surgical History  Procedure Laterality Date  . Tee without cardioversion  11/30/2011    Procedure: TRANSESOPHAGEAL ECHOCARDIOGRAM (TEE);  Surgeon: Thayer Headings, MD;  Location: [place];  Service: Cardiovascular;  Laterality: N/A;  . Cardioversion  11/30/2011    Procedure: CARDIOVERSION;  Surgeon: Thayer Headings, MD;  Location: [place];  Service: Cardiovascular;  Laterality:  N/A;  . Cardioversion  12/03/2011    Procedure: CARDIOVERSION;  Surgeon: Jolaine Artist, MD;  Location: [facility] OR;  Service: Cardiovascular;  Laterality: N/A;    History  Smoking status  . Current Every Day Smoker -- 1.00 packs/day for 20 years  . Types: Cigarettes  Smokeless tobacco  . Never Used    History  Alcohol Use No    Family History  Problem Relation Age of Onset  . Hypertension Mother   . Hypertension Father   . Aneurysm Father   . Alcohol abuse Father   . Cancer Maternal Grandmother     brain  . Diabetes Maternal Grandfather     type 2  . Parkinsonism Maternal Grandfather   . Cancer Paternal Grandmother     breast  . Diabetes Paternal Grandmother   . Alcohol abuse Paternal Grandfather     Review of  Systems: The review of systems is per the HPI.  All other systems were reviewed and are negative.  Physical Exam: BP 120/80  Pulse 77  Ht 5' 10.5" (1.791 m)  Wt 291 lb (131.997 kg)  BMI 41.15 kg/m2  SpO2 94% Patient is very pleasant and in no acute distress. He is morbidly obese. Skin is warm and dry. Color is normal.  HEENT is unremarkable. Normocephalic/atraumatic. PERRL. Sclera are nonicteric. Neck is supple. No masses. No JVD. Lungs are clear. Cardiac exam shows an irregular rhythm. Rate is ok. Abdomen is soft. Extremities are without edema. Gait and ROM are intact. No gross neurologic deficits noted.  Wt Readings from Last 3 Encounters:  07/16/13 291 lb (131.997 kg)  04/15/13 282 lb (127.914 kg)  03/06/13 276 lb (125.193 kg)    LABORATORY DATA/PROCEDURES:  Lab Results  Component Value Date   WBC 10.5 09/04/2012   HGB 17.9* 09/04/2012   HCT 52.9* 09/04/2012   PLT 272.0 09/04/2012   GLUCOSE 93 09/04/2012   CHOL 113 09/04/2012   TRIG 45.0 09/04/2012   HDL 35.90* 09/04/2012   LDLCALC 68 09/04/2012   ALT 16 09/04/2012   AST 17 09/04/2012   NA 138 09/04/2012   K 3.9 09/04/2012   CL 105 09/04/2012   CREATININE 1.0 09/04/2012   BUN 11 09/04/2012   CO2 26 09/04/2012   TSH 1.790 11/27/2011   INR 1.20 11/28/2011   HGBA1C 6.2* 11/27/2011    BNP (last 3 results) No results found for this basename: PROBNP,  in the last 8760 hours  Echo Study Conclusions from March 2015  - Left ventricle: The cavity size was normal. Wall thickness was normal. Systolic function was normal. The estimated ejection fraction was in the range of 55% to 60%. - Mitral valve: Mild regurgitation. - Left atrium: The atrium was severely dilated. - Right atrium: The atrium was moderately dilated. - Atrial septum: No defect or patent foramen ovale was identified.   Assessment / Plan: 1. Chronic atrial fib - managed with rate control and anticoagulation.   2. Chronic anticoagulation - no problems noted. Recheck  baseline labs today.   3. Cardiomyopathy - his EF has recovered.   4. HTN - BP ok  5. Obesity - weight is up - will check baseline labs today - I think he really needs to focus on diet/exercise/weight loss.   See back in 6 months.  Patient is agreeable to this plan and will call if any problems develop in the interim.   Burtis Junes, RN, [facility] [address]  [place],   [id number] [phone]

## 2013-07-16 NOTE — Patient Instructions (Addendum)
Stay on your current medicines  We will check labs today  Try to get back on track with exercise and diet  See Dr. Rayann Heman in 6 months  Call the [place] office at [phone] if you have any questions, problems or concerns.

## 2013-07-23 ENCOUNTER — Encounter: Payer: Self-pay | Admitting: *Deleted

## 2013-07-29 ENCOUNTER — Telehealth: Payer: Self-pay | Admitting: Nurse Practitioner

## 2013-07-29 NOTE — Telephone Encounter (Signed)
New message ° ° ° ° ° °Returning Danielle's call °

## 2013-09-11 ENCOUNTER — Telehealth: Payer: Self-pay | Admitting: Internal Medicine

## 2013-09-11 NOTE — Telephone Encounter (Signed)
Unable to leave message. His voicemail is not set up yet.  Will leave samples out front

## 2013-09-11 NOTE — Telephone Encounter (Signed)
New message      Pt has been out of medicine for 3 days because he no longer has ins.  Is there a generic for all of his medications--furosemide, metoprolol, pradaxa and spironolactone?

## 2013-10-16 NOTE — Telephone Encounter (Addendum)
His Medicaid has run out and he is is trying to get it back.  He is going to have to reapply through social services and was told it could take up to 3 months .  Spoke with Legrand Como at [facility] and he got me the number for free clinic called [facility]  [phone].  [place]

## 2013-12-02 ENCOUNTER — Telehealth: Payer: Self-pay | Admitting: *Deleted

## 2013-12-02 ENCOUNTER — Telehealth: Payer: Self-pay | Admitting: Internal Medicine

## 2013-12-02 NOTE — Telephone Encounter (Signed)
New Message  Pt called for samples of Pradaxa samples.. Transferred call to refills

## 2013-12-02 NOTE — Telephone Encounter (Signed)
Pradaxa samples placed at the front desk for patient. 

## 2013-12-05 ENCOUNTER — Other Ambulatory Visit: Payer: Self-pay | Admitting: Internal Medicine

## 2013-12-26 ENCOUNTER — Telehealth: Payer: Self-pay | Admitting: *Deleted

## 2013-12-26 NOTE — Telephone Encounter (Signed)
Samples from 12/02/13 put back.

## 2014-01-08 ENCOUNTER — Encounter: Payer: Self-pay | Admitting: Internal Medicine

## 2014-01-27 ENCOUNTER — Ambulatory Visit: Payer: Medicaid Other | Admitting: Internal Medicine

## 2014-02-27 ENCOUNTER — Other Ambulatory Visit: Payer: Self-pay | Admitting: Internal Medicine

## 2014-02-27 ENCOUNTER — Encounter: Payer: Self-pay | Admitting: Internal Medicine

## 2014-02-27 ENCOUNTER — Ambulatory Visit (INDEPENDENT_AMBULATORY_CARE_PROVIDER_SITE_OTHER): Payer: Medicaid Other | Admitting: Internal Medicine

## 2014-02-27 VITALS — BP 108/80 | HR 67 | Ht 71.0 in | Wt 294.0 lb

## 2014-02-27 DIAGNOSIS — I482 Chronic atrial fibrillation, unspecified: Secondary | ICD-10-CM

## 2014-02-27 MED ORDER — LISINOPRIL 5 MG PO TABS: 5.0000 mg | ORAL_TABLET | Freq: Every day | ORAL | Status: DC

## 2014-02-27 NOTE — Progress Notes (Deleted)
PCP:  Thompson Grayer, MD  The patient presents today for routine electrophysiology followup.  Since his last visit, the patient reports doing very well. He states no problems as far as heart rate.  He has deferred sleep study in the past and continues to refuse. He has steadily gained weight and weight loss discussed in detail today. Being compliant with medications including blood thinners.   Today, he denies symptoms of fatigue, palpitations, chest pain, SOB, orthopnea, PND,dizziness, presyncope, syncope, or neurologic sequela.  The patient feels that he is tolerating medications without difficulties and is otherwise without complaint today.   Past Medical History  Diagnosis Date  . Chicken pox as a child  . Migraine 06/29/2011  . Obesity 06/29/2011  . Depression with anxiety 06/29/2011  . Atrial fibrillation     admx 11/13 with acute sCHF in setting of RVR  => a. failed DCCV x 2; b. Pradaxa started;  c. failed sotalol  . Chronic systolic heart failure     a. echo 11/13: Ef 40-45%, diff HK, mod MR, mod LAE, mild RVE, mod RAE, small effusion;   b. TEE 11/13:  EF 35-40%, no LAA clot; Echo 2/14 shows normal EF  . Cardiomyopathy     likely tachy mediated in setting of AF with RVR  . Snoring     patient needs sleep study - has declined  . Allergy to IVP dye   . Chronic anticoagulation     Pradaxa   Past Surgical History  Procedure Laterality Date  . Tee without cardioversion  11/30/2011    Procedure: TRANSESOPHAGEAL ECHOCARDIOGRAM (TEE);  Surgeon: Thayer Headings, MD;  Location: [place];  Service: Cardiovascular;  Laterality: N/A;  . Cardioversion  11/30/2011    Procedure: CARDIOVERSION;  Surgeon: Thayer Headings, MD;  Location: [place];  Service: Cardiovascular;  Laterality: N/A;  . Cardioversion  12/03/2011    Procedure: CARDIOVERSION;  Surgeon: Jolaine Artist, MD;  Location: [facility] OR;  Service: Cardiovascular;  Laterality: N/A;    Current Outpatient Prescriptions  Medication  Sig Dispense Refill  . furosemide (LASIX) 20 MG tablet TAKE 1 TABLET BY MOUTH EVERY DAY 30 tablet 3  . metoprolol (LOPRESSOR) 50 MG tablet TAKE 1.5 TABLETS (75 MG TOTAL) BY MOUTH 2 (TWO) TIMES DAILY. 90 tablet 1  . PRADAXA 150 MG CAPS capsule TAKE 1 CAPSULE BY MOUTH EVERY 12 HOURS 60 capsule 3  . spironolactone (ALDACTONE) 25 MG tablet TAKE 1 TABLET BY MOUTH EVERY DAY 30 tablet 3   No current facility-administered medications for this visit.    Allergies  Allergen Reactions  . Contrast Media [Iodinated Diagnostic Agents]     a diffuse macular rash after CTA chest     History   Social History  . Marital Status: Divorced    Spouse Name: N/A  . Number of Children: N/A  . Years of Education: N/A   Occupational History  . Not on file.   Social History Main Topics  . Smoking status: Current Every Day Smoker -- 1.00 packs/day for 20 years    Types: Cigarettes  . Smokeless tobacco: Never Used  . Alcohol Use: No  . Drug Use: No  . Sexual Activity: No   Other Topics Concern  . Not on file   Social History Narrative    Family History  Problem Relation Age of Onset  . Hypertension Mother   . Hypertension Father   . Aneurysm Father   . Alcohol abuse Father   . Cancer Maternal  Grandmother     brain  . Diabetes Maternal Grandfather     type 2  . Parkinsonism Maternal Grandfather   . Cancer Paternal Grandmother     breast  . Diabetes Paternal Grandmother   . Alcohol abuse Paternal Grandfather     ROS-  All systems are reviewed and are negative except as outlined in the HPI above   Physical Exam: Filed Vitals:   02/27/14 0825  BP: 108/80  Pulse: 67  Height: 5\' 11"  (1.803 m)  Weight: 294 lb (133.358 kg)    GEN- The patient is well appearing, alert and oriented x 3 today.   Head- normocephalic, atraumatic Eyes-  Sclera clear, conjunctiva pink Ears- hearing intact Oropharynx- clear Neck- supple, no JVP Lymph- no cervical lymphadenopathy Lungs- Clear to  ausculation bilaterally, normal work of breathing Heart- irregular rate and rhythm, no murmurs, rubs or gallops, PMI not laterally displaced GI- soft, NT, ND, + BS Extremities- no clubbing, cyanosis, no edema MS- no significant deformity or atrophy Skin- no rash or lesion Psych- euthymic mood, full affect Neuro- strength and sensation are intact  ekg today reveals afib, V rate 67 bpm,  Echo 3/15 reviewed .  Assessment and Plan:  1. Longstanding persistent afib He has a h/o prolonged afib. As long as he takes his medicines, he is asymptomatic.  He has failed rhythm control therapy with sotalol previously. Given severe LA enlargement I have previously treated with rate control and he has done well.  His CHADS2VASC score is at least 1.  He is anticoagulated with pradaxa andreports compliance.  For now, we will continue rate control.  2. Nonischemic CM His EF recovered with rate control Start lisinopril 5 mg daily  3. Obesity Weight loss advised He has declined sleep study  4. Tobacco  Currently denies smoking  F/u with Kathrene Alu, in 3 months f/u initiation of ACE. Dr. Rayann Heman in 1 year.

## 2014-02-27 NOTE — Patient Instructions (Signed)
Your physician recommends that you schedule a follow-up appointment in: 3 months with Enid Cutter, NP   Your physician has recommended you make the following change in your medication:  1) Start Lisinopril 5 mg daily

## 2014-02-27 NOTE — Progress Notes (Signed)
PCP:  Thompson Grayer, MD  The patient presents today for routine electrophysiology followup.  Since his last visit, the patient reports doing very well. He states no problems as far as heart rate.  He has deferred sleep study in the past and continues to refuse. He has steadily gained weight and weight loss discussed in detail today. Being compliant with medications including anticoagulation.   Today, he denies symptoms of fatigue, palpitations, chest pain, SOB, orthopnea, PND,dizziness, presyncope, syncope, or neurologic sequela.  The patient feels that he is tolerating medications without difficulties and is otherwise without complaint today.   Past Medical History  Diagnosis Date  . Chicken pox as a child  . Migraine 06/29/2011  . Obesity 06/29/2011  . Depression with anxiety 06/29/2011  . Atrial fibrillation     admx 11/13 with acute sCHF in setting of RVR  => a. failed DCCV x 2; b. Pradaxa started;  c. failed sotalol  . Chronic systolic heart failure     a. echo 11/13: Ef 40-45%, diff HK, mod MR, mod LAE, mild RVE, mod RAE, small effusion;   b. TEE 11/13:  EF 35-40%, no LAA clot; Echo 2/14 shows normal EF  . Cardiomyopathy     likely tachy mediated in setting of AF with RVR  . Snoring     patient needs sleep study - has declined  . Allergy to IVP dye   . Chronic anticoagulation     Pradaxa   Past Surgical History  Procedure Laterality Date  . Tee without cardioversion  11/30/2011    Procedure: TRANSESOPHAGEAL ECHOCARDIOGRAM (TEE);  Surgeon: Thayer Headings, MD;  Location: [place];  Service: Cardiovascular;  Laterality: N/A;  . Cardioversion  11/30/2011    Procedure: CARDIOVERSION;  Surgeon: Thayer Headings, MD;  Location: [place];  Service: Cardiovascular;  Laterality: N/A;  . Cardioversion  12/03/2011    Procedure: CARDIOVERSION;  Surgeon: Jolaine Artist, MD;  Location: [facility] OR;  Service: Cardiovascular;  Laterality: N/A;    Current Outpatient Prescriptions  Medication  Sig Dispense Refill  . furosemide (LASIX) 20 MG tablet TAKE 1 TABLET BY MOUTH EVERY DAY 30 tablet 3  . metoprolol (LOPRESSOR) 50 MG tablet TAKE 1.5 TABLETS (75 MG TOTAL) BY MOUTH 2 (TWO) TIMES DAILY. 90 tablet 1  . PRADAXA 150 MG CAPS capsule TAKE 1 CAPSULE BY MOUTH EVERY 12 HOURS 60 capsule 3  . spironolactone (ALDACTONE) 25 MG tablet TAKE 1 TABLET BY MOUTH EVERY DAY 30 tablet 3   No current facility-administered medications for this visit.    Allergies  Allergen Reactions  . Contrast Media [Iodinated Diagnostic Agents]     a diffuse macular rash after CTA chest     History   Social History  . Marital Status: Divorced    Spouse Name: N/A  . Number of Children: N/A  . Years of Education: N/A   Occupational History  . Not on file.   Social History Main Topics  . Smoking status: Current Every Day Smoker -- 1.00 packs/day for 20 years    Types: Cigarettes  . Smokeless tobacco: Never Used  . Alcohol Use: No  . Drug Use: No  . Sexual Activity: No   Other Topics Concern  . Not on file   Social History Narrative    Family History  Problem Relation Age of Onset  . Hypertension Mother   . Hypertension Father   . Aneurysm Father   . Alcohol abuse Father   . Cancer Maternal Grandmother  brain  . Diabetes Maternal Grandfather     type 2  . Parkinsonism Maternal Grandfather   . Cancer Paternal Grandmother     breast  . Diabetes Paternal Grandmother   . Alcohol abuse Paternal Grandfather     ROS-  All systems are reviewed and are negative except as outlined in the HPI above   Physical Exam: Filed Vitals:   02/27/14 0825  BP: 108/80  Pulse: 67  Height: 5\' 11"  (1.803 m)  Weight: 294 lb (133.358 kg)    GEN- The patient is well appearing, alert and oriented x 3 today.   Head- normocephalic, atraumatic Eyes-  Sclera clear, conjunctiva pink Ears- hearing intact Oropharynx- clear Neck- supple, no JVP Lymph- no cervical lymphadenopathy Lungs- Clear to  ausculation bilaterally, normal work of breathing Heart- irregular rate and rhythm, no murmurs, rubs or gallops, PMI not laterally displaced GI- soft, NT, ND, + BS Extremities- no clubbing, cyanosis, no edema MS- no significant deformity or atrophy Skin- no rash or lesion Psych- euthymic mood, full affect Neuro- strength and sensation are intact  ekg today reveals afib, V rate 67 bpm,  Echo 3/15 reviewed .  Assessment and Plan:  1. Longstanding persistent afib He has a h/o prolonged afib. As long as he takes his medicines for rate control, he is asymptomatic.  He has failed rhythm control therapy with sotalol previously. Given severe LA enlargement I have previously treated with rate control and he has done well.  His CHADS2VASC score is at least 1.  He is anticoagulated with pradaxa and reports compliance.  For now, we will continue rate control.  2. Nonischemic CM His EF recovered with rate control Start lisinopril 5 mg daily  3. Obesity Weight loss advised He has declined sleep study  4. Tobacco  Currently denies smoking  F/u with Kathrene Alu, in 3 months f/u initiation of ACE. I will see again in 1 year

## 2014-04-27 ENCOUNTER — Other Ambulatory Visit: Payer: Self-pay | Admitting: Internal Medicine

## 2014-05-11 ENCOUNTER — Other Ambulatory Visit: Payer: Self-pay | Admitting: Internal Medicine

## 2014-05-28 ENCOUNTER — Ambulatory Visit: Payer: Medicaid Other | Admitting: Internal Medicine

## 2014-06-09 ENCOUNTER — Ambulatory Visit (INDEPENDENT_AMBULATORY_CARE_PROVIDER_SITE_OTHER): Payer: Medicaid Other | Admitting: Internal Medicine

## 2014-06-09 ENCOUNTER — Encounter: Payer: Self-pay | Admitting: Internal Medicine

## 2014-06-09 VITALS — BP 126/82 | HR 91 | Ht 70.5 in | Wt 292.0 lb

## 2014-06-09 DIAGNOSIS — I10 Essential (primary) hypertension: Secondary | ICD-10-CM | POA: Diagnosis not present

## 2014-06-09 DIAGNOSIS — I482 Chronic atrial fibrillation, unspecified: Secondary | ICD-10-CM

## 2014-06-09 DIAGNOSIS — I5022 Chronic systolic (congestive) heart failure: Secondary | ICD-10-CM

## 2014-06-09 DIAGNOSIS — E669 Obesity, unspecified: Secondary | ICD-10-CM

## 2014-06-09 NOTE — Patient Instructions (Signed)
Medication Instructions:  Your physician recommends that you continue on your current medications as directed. Please refer to the Current Medication list given to you today.   Labwork: Your physician recommends that you return for lab work today: BMP/CBC   Testing/Procedure None ordered  Follow-Up: Your physician wants you to follow-up in: 6 months with Truitt Merle, NP and 12 months with Dr Vallery Ridge will receive a reminder letter in the mail two months in advance. If you don't receive a letter, please call our office to schedule the follow-up appointment.   Any Other Special Instructions Will Be Listed Below (If Applicable).  Smoking Cessation Quitting smoking is important to your health and has many advantages. However, it is not always easy to quit since nicotine is a very addictive drug. Oftentimes, people try 3 times or more before being able to quit. This document explains the best ways for you to prepare to quit smoking. Quitting takes hard work and a lot of effort, but you can do it. ADVANTAGES OF QUITTING SMOKING  You will live longer, feel better, and live better.  Your body will feel the impact of quitting smoking almost immediately.  Within 20 minutes, blood pressure decreases. Your pulse returns to its normal level.  After 8 hours, carbon monoxide levels in the blood return to normal. Your oxygen level increases.  After 24 hours, the chance of having a heart attack starts to decrease. Your breath, hair, and body stop smelling like smoke.  After 48 hours, damaged nerve endings begin to recover. Your sense of taste and smell improve.  After 72 hours, the body is virtually free of nicotine. Your bronchial tubes relax and breathing becomes easier.  After 2 to 12 weeks, lungs can hold more air. Exercise becomes easier and circulation improves.  The risk of having a heart attack, stroke, cancer, or lung disease is greatly reduced.  After 1 year, the risk of coronary  heart disease is cut in half.  After 5 years, the risk of stroke falls to the same as a nonsmoker.  After 10 years, the risk of lung cancer is cut in half and the risk of other cancers decreases significantly.  After 15 years, the risk of coronary heart disease drops, usually to the level of a nonsmoker.  If you are pregnant, quitting smoking will improve your chances of having a healthy baby.  The people you live with, especially any children, will be healthier.  You will have extra money to spend on things other than cigarettes. QUESTIONS TO THINK ABOUT BEFORE ATTEMPTING TO QUIT You may want to talk about your answers with your health care provider.  Why do you want to quit?  If you tried to quit in the past, what helped and what did not?  What will be the most difficult situations for you after you quit? How will you plan to handle them?  Who can help you through the tough times? Your family? Friends? A health care provider?  What pleasures do you get from smoking? What ways can you still get pleasure if you quit? Here are some questions to ask your health care provider:  How can you help me to be successful at quitting?  What medicine do you think would be best for me and how should I take it?  What should I do if I need more help?  What is smoking withdrawal like? How can I get information on withdrawal? GET READY  Set a quit date.  Change  your environment by getting rid of all cigarettes, ashtrays, matches, and lighters in your home, car, or work. Do not let people smoke in your home.  Review your past attempts to quit. Think about what worked and what did not. GET SUPPORT AND ENCOURAGEMENT You have a better chance of being successful if you have help. You can get support in many ways.  Tell your family, friends, and coworkers that you are going to quit and need their support. Ask them not to smoke around you.  Get individual, group, or telephone counseling and  support. Programs are available at [employer] and health centers. Call your local health department for information about programs in your area.  Spiritual beliefs and practices may help some smokers quit.  Download a "quit meter" on your computer to keep track of quit statistics, such as how long you have gone without smoking, cigarettes not smoked, and money saved.  Get a self-help book about quitting smoking and staying off tobacco. [place] yourself from urges to smoke. Talk to someone, go for a walk, or occupy your time with a task.  Change your normal routine. Take a different route to work. Drink tea instead of coffee. Eat breakfast in a different place.  Reduce your stress. Take a hot bath, exercise, or read a book.  Plan something enjoyable to do every day. Reward yourself for not smoking.  Explore interactive web-based programs that specialize in helping you quit. GET MEDICINE AND USE IT CORRECTLY Medicines can help you stop smoking and decrease the urge to smoke. Combining medicine with the above behavioral methods and support can greatly increase your chances of successfully quitting smoking.  Nicotine replacement therapy helps deliver nicotine to your body without the negative effects and risks of smoking. Nicotine replacement therapy includes nicotine gum, lozenges, inhalers, nasal sprays, and skin patches. Some may be available over-the-counter and others require a prescription.  Antidepressant medicine helps people abstain from smoking, but how this works is unknown. This medicine is available by prescription.  Nicotinic receptor partial agonist medicine simulates the effect of nicotine in your brain. This medicine is available by prescription. Ask your health care provider for advice about which medicines to use and how to use them based on your health history. Your health care provider will tell you what side effects to look out for if  you choose to be on a medicine or therapy. Carefully read the information on the package. Do not use any other product containing nicotine while using a nicotine replacement product.  RELAPSE OR DIFFICULT SITUATIONS Most relapses occur within the first 3 months after quitting. Do not be discouraged if you start smoking again. Remember, most people try several times before finally quitting. You may have symptoms of withdrawal because your body is used to nicotine. You may crave cigarettes, be irritable, feel very hungry, cough often, get headaches, or have difficulty concentrating. The withdrawal symptoms are only temporary. They are strongest when you first quit, but they will go away within 10-14 days. To reduce the chances of relapse, try to:  Avoid drinking alcohol. Drinking lowers your chances of successfully quitting.  Reduce the amount of caffeine you consume. Once you quit smoking, the amount of caffeine in your body increases and can give you symptoms, such as a rapid heartbeat, sweating, and anxiety.  Avoid smokers because they can make you want to smoke.  Do not let weight gain distract you. Many smokers will gain weight  when they quit, usually less than 10 pounds. Eat a healthy diet and stay active. You can always lose the weight gained after you quit.  Find ways to improve your mood other than smoking. FOR MORE INFORMATION  www.smokefree.gov  Document Released: 12/28/2000 Document Revised: 05/20/2013 Document Reviewed: 04/14/2011 [facility] Patient Information 2015 [place], [place]. This information is not intended to replace advice given to you by your health care provider. Make sure you discuss any questions you have with your health care provider.

## 2014-06-09 NOTE — Progress Notes (Signed)
PCP:  Thompson Grayer, MD  The patient presents today for routine electrophysiology followup.  Since his last visit, the patient reports doing very well. He states no problems as far as heart rate.  He has deferred sleep study in the past and continues to refuse.  He has made no real gain with lifestyle modification. Todays visit was supposed to be to with Truitt Merle to follow-up on lisinopril which was started last visit.  Unfortunately, he never started this medicine.  He states, "I googled this medicine and there a lot of problems with it.  Many people say its a bad medicines".  Today, he denies symptoms of fatigue, palpitations, chest pain, SOB, orthopnea, PND,dizziness, presyncope, syncope, or neurologic sequela.  The patient feels that he is tolerating medications without difficulties and is otherwise without complaint today.   Past Medical History  Diagnosis Date  . Chicken pox as a child  . Migraine 06/29/2011  . Obesity 06/29/2011  . Depression with anxiety 06/29/2011  . Atrial fibrillation     admx 11/13 with acute sCHF in setting of RVR  => a. failed DCCV x 2; b. Pradaxa started;  c. failed sotalol  . Chronic systolic heart failure     a. echo 11/13: Ef 40-45%, diff HK, mod MR, mod LAE, mild RVE, mod RAE, small effusion;   b. TEE 11/13:  EF 35-40%, no LAA clot; Echo 2/14 shows normal EF  . Cardiomyopathy     likely tachy mediated in setting of AF with RVR  . Snoring     patient needs sleep study - has declined  . Allergy to IVP dye   . Chronic anticoagulation     Pradaxa   Past Surgical History  Procedure Laterality Date  . Tee without cardioversion  11/30/2011    Procedure: TRANSESOPHAGEAL ECHOCARDIOGRAM (TEE);  Surgeon: Thayer Headings, MD;  Location: [place];  Service: Cardiovascular;  Laterality: N/A;  . Cardioversion  11/30/2011    Procedure: CARDIOVERSION;  Surgeon: Thayer Headings, MD;  Location: [place];  Service: Cardiovascular;  Laterality: N/A;  .  Cardioversion  12/03/2011    Procedure: CARDIOVERSION;  Surgeon: Jolaine Artist, MD;  Location: [facility];  Service: Cardiovascular;  Laterality: N/A;    Current Outpatient Prescriptions  Medication Sig Dispense Refill  . furosemide (LASIX) 20 MG tablet TAKE 1 TABLET BY MOUTH EVERY DAY 30 tablet 3  . metoprolol (LOPRESSOR) 50 MG tablet TAKE 1.5 TABLETS (75 MG TOTAL) BY MOUTH 2 (TWO) TIMES DAILY. 90 tablet 1  . PRADAXA 150 MG CAPS capsule TAKE 1 CAPSULE BY MOUTH EVERY 12 HOURS 60 capsule 3  . spironolactone (ALDACTONE) 25 MG tablet TAKE 1 TABLET BY MOUTH EVERY DAY 30 tablet 3   No current facility-administered medications for this visit.    Allergies  Allergen Reactions  . Contrast Media [Iodinated Diagnostic Agents]     a diffuse macular rash after CTA chest     History   Social History  . Marital Status: Divorced    Spouse Name: N/A  . Number of Children: N/A  . Years of Education: N/A   Occupational History  . Not on file.   Social History Main Topics  . Smoking status: Current Every Day Smoker -- 1.00 packs/day for 20 years    Types: Cigarettes  . Smokeless tobacco: Never Used  . Alcohol Use: No  . Drug Use: No  . Sexual Activity: No   Other Topics Concern  . Not on file  Social History Narrative    Family History  Problem Relation Age of Onset  . Hypertension Mother   . Hypertension Father   . Aneurysm Father   . Alcohol abuse Father   . Cancer Maternal Grandmother     brain  . Diabetes Maternal Grandfather     type 2  . Parkinsonism Maternal Grandfather   . Cancer Paternal Grandmother     breast  . Diabetes Paternal Grandmother   . Alcohol abuse Paternal Grandfather     ROS-  All systems are reviewed and are negative except as outlined in the HPI above   Physical Exam: Filed Vitals:   02/27/14 0825  BP: 108/80  Pulse: 67  Height: 5\' 11"  (1.803 m)  Weight: 294 lb (133.358 kg)    GEN- The patient is well appearing, alert and oriented x 3  today.   Head- normocephalic, atraumatic Eyes-  Sclera clear, conjunctiva pink Ears- hearing intact Oropharynx- clear Neck- supple,  Lungs- Clear to ausculation bilaterally, normal work of breathing Heart- irregular rate and rhythm, no murmurs, rubs or gallops, PMI not laterally displaced GI- soft, NT, ND, + BS Extremities- no clubbing, cyanosis, no edema MS- no significant deformity or atrophy Skin- no rash or lesion Psych- euthymic mood, full affect Neuro- strength and sensation are intact  ekg today reveals afib  Echo 3/15 reviewed .  Assessment and Plan:  1. Longstanding persistent afib He has a h/o prolonged afib. As long as he takes his medicines for rate control, he is asymptomatic.  He has failed rhythm control therapy with sotalol previously. Given severe LA enlargement I have previously treated with rate control and he has done well.  His CHADS2VASC score is at least 1.  He is anticoagulated with pradaxa and reports compliance.  For now, we will continue rate control.  Bmet, CBC today  2. Nonischemic CM His EF recovered with rate control He has not been compliant with lisinopril due to misinformation which he has received on the internet.  I have informed him that I think that he is making an unwise decision and have encouraged him to better pick his medical sources.    3. Obesity Weight loss advised He has declined sleep study  4. Tobacco  Continues to smoke from time to time.  I have encouraged him to check the adverse effects of smoking on his internet medical sources which he seems to value more than my medical opinion.  F/u with Kathrene Alu in 6 months  I will see again in 1 year

## 2014-06-10 ENCOUNTER — Telehealth: Payer: Self-pay | Admitting: *Deleted

## 2014-06-10 LAB — BASIC METABOLIC PANEL
BUN: 13 mg/dL (ref 6–23)
CALCIUM: 9.2 mg/dL (ref 8.4–10.5)
CO2: 25 meq/L (ref 19–32)
Chloride: 105 mEq/L (ref 96–112)
Creatinine, Ser: 0.92 mg/dL (ref 0.40–1.50)
GFR: 93.81 mL/min (ref >60.00)
Glucose, Bld: 98 mg/dL (ref 70–99)
Potassium: 4.6 mEq/L (ref 3.5–5.1)
SODIUM: 136 meq/L (ref 135–145)

## 2014-06-10 LAB — CBC WITH DIFFERENTIAL/PLATELET
BASOS PCT: 0.4 % (ref 0.0–3.0)
Basophils Absolute: 0 10*3/uL (ref 0.0–0.1)
EOS ABS: 0.2 10*3/uL (ref 0.0–0.7)
EOS PCT: 2.4 % (ref 0.0–5.0)
HEMATOCRIT: 52.7 % — AB (ref 39.0–52.0)
Hemoglobin: 18.2 g/dL (ref 13.0–17.0)
LYMPHS PCT: 29.2 % (ref 12.0–46.0)
Lymphs Abs: 2.8 10*3/uL (ref 0.7–4.0)
MCHC: 34.5 g/dL (ref 30.0–36.0)
MCV: 93.6 fl (ref 78.0–100.0)
MONO ABS: 0.6 10*3/uL (ref 0.1–1.0)
MONOS PCT: 6.6 % (ref 3.0–12.0)
NEUTROS PCT: 61.4 % (ref 43.0–77.0)
Neutro Abs: 6 10*3/uL (ref 1.4–7.7)
Platelets: 234 10*3/uL (ref 150.0–400.0)
RBC: 5.63 Mil/uL (ref 4.22–5.81)
RDW: 14.6 % (ref 11.5–15.5)
WBC: 9.7 10*3/uL (ref 4.0–10.5)

## 2014-06-10 NOTE — Telephone Encounter (Signed)
Santiago Glad from [facility] called with critical lab result. HGB; 18.2 Lab history was reviewed/ I will forward to Dr Lovena Le.

## 2014-10-07 ENCOUNTER — Encounter: Payer: Self-pay | Admitting: Pharmacist

## 2014-10-13 ENCOUNTER — Other Ambulatory Visit: Payer: Self-pay | Admitting: *Deleted

## 2014-10-13 NOTE — Telephone Encounter (Signed)
needed pradaxa samples due to medicaid needing to be renewed, pt wanted several months worth and i gave him 30 days with the suggestion to reapply because we could not keep giving out monthly supplies. pt in agreement with this.

## 2014-12-18 DIAGNOSIS — K55059 Acute (reversible) ischemia of intestine, part and extent unspecified: Secondary | ICD-10-CM

## 2014-12-18 HISTORY — DX: Acute (reversible) ischemia of intestine, part and extent unspecified: K55.059

## 2015-01-10 ENCOUNTER — Encounter (HOSPITAL_COMMUNITY): Payer: Self-pay | Admitting: Emergency Medicine

## 2015-01-10 ENCOUNTER — Emergency Department (HOSPITAL_COMMUNITY)
Admission: EM | Admit: 2015-01-10 | Discharge: 2015-01-10 | Discharge status: Against Medical Advice | Payer: Medicaid Other | Attending: Emergency Medicine | Admitting: Emergency Medicine

## 2015-01-10 DIAGNOSIS — I5022 Chronic systolic (congestive) heart failure: Secondary | ICD-10-CM | POA: Insufficient documentation

## 2015-01-10 DIAGNOSIS — R1030 Lower abdominal pain, unspecified: Secondary | ICD-10-CM | POA: Insufficient documentation

## 2015-01-10 DIAGNOSIS — F17210 Nicotine dependence, cigarettes, uncomplicated: Secondary | ICD-10-CM | POA: Diagnosis not present

## 2015-01-10 DIAGNOSIS — E669 Obesity, unspecified: Secondary | ICD-10-CM | POA: Diagnosis not present

## 2015-01-10 NOTE — ED Notes (Addendum)
Per EMS-states abdominal pain/groin pain for 3-4 days-dysuria, painful urination and constipation-states he has been off all his "important" meds for three days because he didn't want them to react to tylenol-just recently started back on meds 2 weeks ago

## 2015-01-10 NOTE — ED Notes (Signed)
I have just been informed by our registrationist that pt. Just left.

## 2015-01-14 ENCOUNTER — Encounter (HOSPITAL_COMMUNITY): Payer: Self-pay | Admitting: *Deleted

## 2015-01-14 ENCOUNTER — Inpatient Hospital Stay (HOSPITAL_COMMUNITY)
Admission: EM | Admit: 2015-01-14 | Discharge: 2015-02-02 | DRG: 329 | Disposition: A | Discharge status: Home Health | Payer: Medicaid Other | Attending: Internal Medicine | Admitting: Internal Medicine

## 2015-01-14 ENCOUNTER — Emergency Department (HOSPITAL_COMMUNITY): Payer: Medicaid Other

## 2015-01-14 DIAGNOSIS — I5023 Acute on chronic systolic (congestive) heart failure: Secondary | ICD-10-CM | POA: Diagnosis not present

## 2015-01-14 DIAGNOSIS — G4733 Obstructive sleep apnea (adult) (pediatric): Secondary | ICD-10-CM | POA: Diagnosis present

## 2015-01-14 DIAGNOSIS — Z91041 Radiographic dye allergy status: Secondary | ICD-10-CM

## 2015-01-14 DIAGNOSIS — I481 Persistent atrial fibrillation: Secondary | ICD-10-CM | POA: Diagnosis present

## 2015-01-14 DIAGNOSIS — J9601 Acute respiratory failure with hypoxia: Secondary | ICD-10-CM | POA: Diagnosis not present

## 2015-01-14 DIAGNOSIS — T8131XA Disruption of external operation (surgical) wound, not elsewhere classified, initial encounter: Secondary | ICD-10-CM | POA: Diagnosis not present

## 2015-01-14 DIAGNOSIS — I4891 Unspecified atrial fibrillation: Secondary | ICD-10-CM | POA: Diagnosis not present

## 2015-01-14 DIAGNOSIS — F17210 Nicotine dependence, cigarettes, uncomplicated: Secondary | ICD-10-CM | POA: Diagnosis present

## 2015-01-14 DIAGNOSIS — R52 Pain, unspecified: Secondary | ICD-10-CM

## 2015-01-14 DIAGNOSIS — R6521 Severe sepsis with septic shock: Secondary | ICD-10-CM | POA: Diagnosis not present

## 2015-01-14 DIAGNOSIS — R0602 Shortness of breath: Secondary | ICD-10-CM

## 2015-01-14 DIAGNOSIS — A419 Sepsis, unspecified organism: Secondary | ICD-10-CM | POA: Diagnosis not present

## 2015-01-14 DIAGNOSIS — I4892 Unspecified atrial flutter: Secondary | ICD-10-CM | POA: Diagnosis not present

## 2015-01-14 DIAGNOSIS — R0902 Hypoxemia: Secondary | ICD-10-CM

## 2015-01-14 DIAGNOSIS — E876 Hypokalemia: Secondary | ICD-10-CM | POA: Diagnosis not present

## 2015-01-14 DIAGNOSIS — Y833 Surgical operation with formation of external stoma as the cause of abnormal reaction of the patient, or of later complication, without mention of misadventure at the time of the procedure: Secondary | ICD-10-CM | POA: Diagnosis not present

## 2015-01-14 DIAGNOSIS — E874 Mixed disorder of acid-base balance: Secondary | ICD-10-CM | POA: Diagnosis not present

## 2015-01-14 DIAGNOSIS — R0600 Dyspnea, unspecified: Secondary | ICD-10-CM | POA: Diagnosis not present

## 2015-01-14 DIAGNOSIS — I482 Chronic atrial fibrillation: Secondary | ICD-10-CM | POA: Diagnosis not present

## 2015-01-14 DIAGNOSIS — K650 Generalized (acute) peritonitis: Secondary | ICD-10-CM | POA: Diagnosis not present

## 2015-01-14 DIAGNOSIS — J90 Pleural effusion, not elsewhere classified: Secondary | ICD-10-CM | POA: Diagnosis not present

## 2015-01-14 DIAGNOSIS — I429 Cardiomyopathy, unspecified: Secondary | ICD-10-CM | POA: Diagnosis present

## 2015-01-14 DIAGNOSIS — R571 Hypovolemic shock: Secondary | ICD-10-CM | POA: Diagnosis not present

## 2015-01-14 DIAGNOSIS — IMO0002 Reserved for concepts with insufficient information to code with codable children: Secondary | ICD-10-CM

## 2015-01-14 DIAGNOSIS — Z79899 Other long term (current) drug therapy: Secondary | ICD-10-CM | POA: Diagnosis not present

## 2015-01-14 DIAGNOSIS — G9340 Encephalopathy, unspecified: Secondary | ICD-10-CM | POA: Diagnosis not present

## 2015-01-14 DIAGNOSIS — F329 Major depressive disorder, single episode, unspecified: Secondary | ICD-10-CM | POA: Diagnosis present

## 2015-01-14 DIAGNOSIS — I5040 Unspecified combined systolic (congestive) and diastolic (congestive) heart failure: Secondary | ICD-10-CM | POA: Diagnosis not present

## 2015-01-14 DIAGNOSIS — R109 Unspecified abdominal pain: Secondary | ICD-10-CM | POA: Diagnosis present

## 2015-01-14 DIAGNOSIS — Z6841 Body Mass Index (BMI) 40.0 and over, adult: Secondary | ICD-10-CM

## 2015-01-14 DIAGNOSIS — K5720 Diverticulitis of large intestine with perforation and abscess without bleeding: Principal | ICD-10-CM | POA: Diagnosis present

## 2015-01-14 DIAGNOSIS — K651 Peritoneal abscess: Secondary | ICD-10-CM | POA: Diagnosis not present

## 2015-01-14 DIAGNOSIS — N390 Urinary tract infection, site not specified: Secondary | ICD-10-CM | POA: Diagnosis present

## 2015-01-14 DIAGNOSIS — E873 Alkalosis: Secondary | ICD-10-CM | POA: Diagnosis not present

## 2015-01-14 DIAGNOSIS — L8990 Pressure ulcer of unspecified site, unspecified stage: Secondary | ICD-10-CM | POA: Diagnosis not present

## 2015-01-14 DIAGNOSIS — N179 Acute kidney failure, unspecified: Secondary | ICD-10-CM | POA: Diagnosis present

## 2015-01-14 DIAGNOSIS — K55069 Acute infarction of intestine, part and extent unspecified: Secondary | ICD-10-CM | POA: Diagnosis not present

## 2015-01-14 DIAGNOSIS — B9689 Other specified bacterial agents as the cause of diseases classified elsewhere: Secondary | ICD-10-CM | POA: Diagnosis not present

## 2015-01-14 DIAGNOSIS — J9600 Acute respiratory failure, unspecified whether with hypoxia or hypercapnia: Secondary | ICD-10-CM | POA: Diagnosis not present

## 2015-01-14 DIAGNOSIS — R5381 Other malaise: Secondary | ICD-10-CM | POA: Diagnosis not present

## 2015-01-14 DIAGNOSIS — R0682 Tachypnea, not elsewhere classified: Secondary | ICD-10-CM

## 2015-01-14 DIAGNOSIS — K659 Peritonitis, unspecified: Secondary | ICD-10-CM | POA: Diagnosis not present

## 2015-01-14 DIAGNOSIS — J9690 Respiratory failure, unspecified, unspecified whether with hypoxia or hypercapnia: Secondary | ICD-10-CM | POA: Insufficient documentation

## 2015-01-14 DIAGNOSIS — K631 Perforation of intestine (nontraumatic): Secondary | ICD-10-CM | POA: Diagnosis not present

## 2015-01-14 DIAGNOSIS — K9402 Colostomy infection: Secondary | ICD-10-CM | POA: Diagnosis not present

## 2015-01-14 DIAGNOSIS — Z452 Encounter for adjustment and management of vascular access device: Secondary | ICD-10-CM

## 2015-01-14 DIAGNOSIS — R000 Tachycardia, unspecified: Secondary | ICD-10-CM | POA: Diagnosis not present

## 2015-01-14 DIAGNOSIS — I509 Heart failure, unspecified: Secondary | ICD-10-CM

## 2015-01-14 DIAGNOSIS — F419 Anxiety disorder, unspecified: Secondary | ICD-10-CM | POA: Diagnosis present

## 2015-01-14 DIAGNOSIS — J690 Pneumonitis due to inhalation of food and vomit: Secondary | ICD-10-CM | POA: Diagnosis not present

## 2015-01-14 DIAGNOSIS — J811 Chronic pulmonary edema: Secondary | ICD-10-CM

## 2015-01-14 DIAGNOSIS — Z933 Colostomy status: Secondary | ICD-10-CM | POA: Diagnosis not present

## 2015-01-14 DIAGNOSIS — R739 Hyperglycemia, unspecified: Secondary | ICD-10-CM | POA: Diagnosis present

## 2015-01-14 DIAGNOSIS — Z7901 Long term (current) use of anticoagulants: Secondary | ICD-10-CM | POA: Diagnosis not present

## 2015-01-14 DIAGNOSIS — Z9049 Acquired absence of other specified parts of digestive tract: Secondary | ICD-10-CM | POA: Diagnosis not present

## 2015-01-14 LAB — CBC
HCT: 45.3 % (ref 39.0–52.0)
Hemoglobin: 15.9 g/dL (ref 13.0–17.0)
MCH: 32 pg (ref 26.0–34.0)
MCHC: 35.1 g/dL (ref 30.0–36.0)
MCV: 91.1 fL (ref 78.0–100.0)
PLATELETS: 214 10*3/uL (ref 150–400)
RBC: 4.97 MIL/uL (ref 4.22–5.81)
RDW: 14 % (ref 11.5–15.5)
WBC: 11.9 10*3/uL — AB (ref 4.0–10.5)

## 2015-01-14 LAB — COMPREHENSIVE METABOLIC PANEL
ALBUMIN: 2.6 g/dL — AB (ref 3.5–5.0)
ALK PHOS: 85 U/L (ref 38–126)
ALT: 21 U/L (ref 17–63)
AST: 22 U/L (ref 15–41)
Anion gap: 13 (ref 5–15)
BILIRUBIN TOTAL: 1.2 mg/dL (ref 0.3–1.2)
BUN: 33 mg/dL — AB (ref 6–20)
CALCIUM: 8.9 mg/dL (ref 8.9–10.3)
CO2: 20 mmol/L — ABNORMAL LOW (ref 22–32)
CREATININE: 1.95 mg/dL — AB (ref 0.61–1.24)
Chloride: 100 mmol/L — ABNORMAL LOW (ref 101–111)
GFR calc Af Amer: 45 mL/min — ABNORMAL LOW (ref >60)
GFR, EST NON AFRICAN AMERICAN: 39 mL/min — AB (ref >60)
GLUCOSE: 185 mg/dL — AB (ref 65–99)
POTASSIUM: 3.5 mmol/L (ref 3.5–5.1)
Sodium: 133 mmol/L — ABNORMAL LOW (ref 135–145)
TOTAL PROTEIN: 6.7 g/dL (ref 6.5–8.1)

## 2015-01-14 LAB — URINALYSIS, ROUTINE W REFLEX MICROSCOPIC
GLUCOSE, UA: NEGATIVE mg/dL
Ketones, ur: 15 mg/dL — AB
NITRITE: POSITIVE — AB
Protein, ur: 100 mg/dL — AB
SPECIFIC GRAVITY, URINE: 1.024 (ref 1.005–1.030)
pH: 5 (ref 5.0–8.0)

## 2015-01-14 LAB — URINE MICROSCOPIC-ADD ON

## 2015-01-14 LAB — LIPASE, BLOOD: Lipase: 18 U/L (ref 11–51)

## 2015-01-14 LAB — MRSA PCR SCREENING: MRSA by PCR: NEGATIVE

## 2015-01-14 LAB — I-STAT CG4 LACTIC ACID, ED: Lactic Acid, Venous: 1.39 mmol/L (ref 0.5–2.0)

## 2015-01-14 LAB — I-STAT TROPONIN, ED: TROPONIN I, POC: 0.01 ng/mL (ref 0.00–0.08)

## 2015-01-14 MED ORDER — ONDANSETRON HCL 4 MG/2ML IJ SOLN
4.0000 mg | Freq: Once | INTRAMUSCULAR | Status: AC
  Administered 2015-01-14: 4 mg via INTRAVENOUS
  Filled 2015-01-14: qty 2

## 2015-01-14 MED ORDER — DILTIAZEM HCL 100 MG IV SOLR
5.0000 mg/h | INTRAVENOUS | Status: DC
  Administered 2015-01-15 – 2015-01-17 (×6): 15 mg/h via INTRAVENOUS
  Filled 2015-01-14 (×10): qty 100

## 2015-01-14 MED ORDER — PIPERACILLIN-TAZOBACTAM 3.375 G IVPB
3.3750 g | Freq: Three times a day (TID) | INTRAVENOUS | Status: DC
  Administered 2015-01-14 – 2015-01-16 (×6): 3.375 g via INTRAVENOUS
  Filled 2015-01-14 (×9): qty 50

## 2015-01-14 MED ORDER — SODIUM CHLORIDE 0.9 % IV SOLN
INTRAVENOUS | Status: DC
  Administered 2015-01-14 – 2015-01-15 (×3): via INTRAVENOUS

## 2015-01-14 MED ORDER — ONDANSETRON HCL 4 MG PO TABS: 4.0000 mg | ORAL_TABLET | Freq: Four times a day (QID) | ORAL | Status: DC | PRN

## 2015-01-14 MED ORDER — DILTIAZEM HCL 25 MG/5ML IV SOLN
10.0000 mg | Freq: Once | INTRAVENOUS | Status: AC
  Administered 2015-01-14: 10 mg via INTRAVENOUS
  Filled 2015-01-14: qty 5

## 2015-01-14 MED ORDER — METOPROLOL TARTRATE 1 MG/ML IV SOLN
5.0000 mg | Freq: Four times a day (QID) | INTRAVENOUS | Status: DC | PRN
  Administered 2015-01-14: 5 mg via INTRAVENOUS
  Filled 2015-01-14: qty 5

## 2015-01-14 MED ORDER — MORPHINE SULFATE (PF) 2 MG/ML IV SOLN
1.0000 mg | INTRAVENOUS | Status: DC | PRN
Start: 2015-01-14 — End: 2015-01-17
  Administered 2015-01-14 – 2015-01-17 (×10): 1 mg via INTRAVENOUS
  Filled 2015-01-14 (×10): qty 1

## 2015-01-14 MED ORDER — INFLUENZA VAC SPLIT QUAD 0.5 ML IM SUSY
0.5000 mL | PREFILLED_SYRINGE | INTRAMUSCULAR | Status: DC
  Filled 2015-01-14: qty 0.5

## 2015-01-14 MED ORDER — METRONIDAZOLE IN NACL 5-0.79 MG/ML-% IV SOLN
500.0000 mg | Freq: Once | INTRAVENOUS | Status: DC
  Filled 2015-01-14: qty 100

## 2015-01-14 MED ORDER — METOPROLOL TARTRATE 1 MG/ML IV SOLN
5.0000 mg | Freq: Four times a day (QID) | INTRAVENOUS | Status: DC
  Administered 2015-01-14: 5 mg via INTRAVENOUS
  Filled 2015-01-14 (×2): qty 5

## 2015-01-14 MED ORDER — LORAZEPAM 2 MG/ML IJ SOLN
1.0000 mg | Freq: Once | INTRAMUSCULAR | Status: DC
Start: 2015-01-14 — End: 2015-01-14
  Filled 2015-01-14: qty 1

## 2015-01-14 MED ORDER — DILTIAZEM HCL 100 MG IV SOLR
5.0000 mg/h | Freq: Once | INTRAVENOUS | Status: AC
  Administered 2015-01-14: 5 mg/h via INTRAVENOUS
  Filled 2015-01-14: qty 100

## 2015-01-14 MED ORDER — HEPARIN SODIUM (PORCINE) 5000 UNIT/ML IJ SOLN
5000.0000 [IU] | Freq: Three times a day (TID) | INTRAMUSCULAR | Status: DC
  Administered 2015-01-14 – 2015-01-20 (×17): 5000 [IU] via SUBCUTANEOUS
  Filled 2015-01-14 (×17): qty 1

## 2015-01-14 MED ORDER — KETOROLAC TROMETHAMINE 30 MG/ML IJ SOLN
30.0000 mg | Freq: Once | INTRAMUSCULAR | Status: AC
  Administered 2015-01-14: 30 mg via INTRAVENOUS
  Filled 2015-01-14: qty 1

## 2015-01-14 MED ORDER — ONDANSETRON HCL 4 MG/2ML IJ SOLN
4.0000 mg | Freq: Four times a day (QID) | INTRAMUSCULAR | Status: DC | PRN
  Administered 2015-01-15: 4 mg via INTRAVENOUS
  Filled 2015-01-14: qty 2

## 2015-01-14 MED ORDER — SODIUM CHLORIDE 0.9 % IV BOLUS (SEPSIS)
1000.0000 mL | Freq: Once | INTRAVENOUS | Status: AC
  Administered 2015-01-14: 1000 mL via INTRAVENOUS

## 2015-01-14 MED ORDER — PNEUMOCOCCAL VAC POLYVALENT 25 MCG/0.5ML IJ INJ
0.5000 mL | INJECTION | INTRAMUSCULAR | Status: DC
  Filled 2015-01-14: qty 0.5

## 2015-01-14 MED ORDER — LORAZEPAM 2 MG/ML IJ SOLN
0.5000 mg | Freq: Once | INTRAMUSCULAR | Status: AC
Start: 2015-01-14 — End: 2015-01-14
  Administered 2015-01-14: 0.5 mg via INTRAVENOUS

## 2015-01-14 MED ORDER — CIPROFLOXACIN IN D5W 400 MG/200ML IV SOLN
400.0000 mg | Freq: Once | INTRAVENOUS | Status: DC
  Filled 2015-01-14: qty 200

## 2015-01-14 MED ORDER — DILTIAZEM HCL 100 MG IV SOLR
5.0000 mg/h | Freq: Once | INTRAVENOUS | Status: AC
  Administered 2015-01-14: 15 mg/h via INTRAVENOUS
  Filled 2015-01-14: qty 100

## 2015-01-14 NOTE — Progress Notes (Signed)
Dr. Posey Pronto and I saw the patient @ 8:00pm. Patient says he currently has no abdominal pain, but is unable to sleep 2/2 catheter irritation, repeatedly requesting removal, as well as anxiety regarding his hospitalization. Abdominal exam NABS, obese distended abdomen without any tenderness, rebound, or guarding currently.  Plan - Will remove cath with continued strict I's & O's - Follow-up abdominal exams

## 2015-01-14 NOTE — ED Provider Notes (Signed)
CSN: [id number]     Arrival date & time 01/14/15  [id number] History   First MD Initiated Contact with Patient 01/14/15 [phone]     Chief Complaint  Patient presents with  . Abdominal Pain     (Consider location/radiation/quality/duration/timing/severity/associated sxs/prior Treatment) Patient is a 49 y.o. male presenting with abdominal pain. The history is provided by the patient (The patient states he's been having abdominal pain for about a week. It has gotten worse).  Abdominal Pain Pain location:  LLQ Pain quality: aching and dull   Pain radiates to:  Does not radiate Pain severity:  Moderate Onset quality:  Gradual Timing:  Constant Progression:  Worsening Chronicity:  New Context: not alcohol use   Associated symptoms: no chest pain, no cough, no diarrhea, no fatigue and no hematuria     Past Medical History  Diagnosis Date  . Chicken pox as a child  . Migraine 06/29/2011  . Obesity 06/29/2011  . Depression with anxiety 06/29/2011  . Atrial fibrillation ([place])     admx 11/13 with acute sCHF in setting of RVR  => a. failed DCCV x 2; b. Pradaxa started;  c. failed sotalol  . Chronic systolic heart failure ([place])     a. echo 11/13: Ef 40-45%, diff HK, mod MR, mod LAE, mild RVE, mod RAE, small effusion;   b. TEE 11/13:  EF 35-40%, no LAA clot; Echo 2/14 shows normal EF  . Cardiomyopathy ([place])     likely tachy mediated in setting of AF with RVR  . Snoring     patient needs sleep study - has declined  . Allergy to IVP dye   . Chronic anticoagulation     Pradaxa   Past Surgical History  Procedure Laterality Date  . Tee without cardioversion  11/30/2011    Procedure: TRANSESOPHAGEAL ECHOCARDIOGRAM (TEE);  Surgeon: Thayer Headings, MD;  Location: [place];  Service: Cardiovascular;  Laterality: N/A;  . Cardioversion  11/30/2011    Procedure: CARDIOVERSION;  Surgeon: Thayer Headings, MD;  Location: [place];  Service: Cardiovascular;  Laterality: N/A;  . Cardioversion   12/03/2011    Procedure: CARDIOVERSION;  Surgeon: Jolaine Artist, MD;  Location: [facility] OR;  Service: Cardiovascular;  Laterality: N/A;   Family History  Problem Relation Age of Onset  . Hypertension Mother   . Hypertension Father   . Aneurysm Father   . Alcohol abuse Father   . Cancer Maternal Grandmother     brain  . Diabetes Maternal Grandfather     type 2  . Parkinsonism Maternal Grandfather   . Cancer Paternal Grandmother     breast  . Diabetes Paternal Grandmother   . Alcohol abuse Paternal Grandfather    Social History  Substance Use Topics  . Smoking status: Current Every Day Smoker -- 1.00 packs/day for 20 years    Types: Cigarettes  . Smokeless tobacco: Never Used  . Alcohol Use: No    Review of Systems  Constitutional: Negative for appetite change and fatigue.  HENT: Negative for congestion, ear discharge and sinus pressure.   Eyes: Negative for discharge.  Respiratory: Negative for cough.   Cardiovascular: Negative for chest pain.  Gastrointestinal: Positive for abdominal pain. Negative for diarrhea.  Genitourinary: Negative for frequency and hematuria.  Musculoskeletal: Negative for back pain.  Skin: Negative for rash.  Neurological: Negative for seizures and headaches.  Psychiatric/Behavioral: Negative for hallucinations.      Allergies  Contrast media  Home Medications   Prior to Admission  medications   Medication Sig Start Date End Date Taking? Authorizing Provider  metoprolol (LOPRESSOR) 50 MG tablet TAKE 1 & 1/2 TABLETS (75 MG TOTAL) BY MOUTH 2 (TWO) TIMES DAILY. 04/28/14  Yes Thompson Grayer, MD  PRADAXA 150 MG CAPS capsule TAKE 1 CAPSULE BY MOUTH EVERY 12 HOURS 03/03/14  Yes Thompson Grayer, MD  spironolactone (ALDACTONE) 25 MG tablet TAKE 1 TABLET BY MOUTH EVERY DAY 03/03/14  Yes Thompson Grayer, MD  furosemide (LASIX) 20 MG tablet TAKE 1 TABLET BY MOUTH EVERY DAY 03/03/14   Thompson Grayer, MD   BP 101/72 mmHg  Pulse 134  Temp(Src) 98.7 F (37.1 C)  (Oral)  Resp 24  Ht 5\' 11"  (1.803 m)  Wt 294 lb (133.358 kg)  BMI 41.02 kg/m2  SpO2 92% Physical Exam  Constitutional: He is oriented to person, place, and time. He appears well-developed.  HENT:  Head: Normocephalic.  Eyes: Conjunctivae and EOM are normal. No scleral icterus.  Neck: Neck supple. No thyromegaly present.  Cardiovascular: Exam reveals no gallop and no friction rub.   No murmur heard. Irregular rapid rate  Pulmonary/Chest: No stridor. He has no wheezes. He has no rales. He exhibits no tenderness.  Abdominal: He exhibits no distension. There is tenderness. There is no rebound.  Mild tender left lower quadrant  Musculoskeletal: Normal range of motion. He exhibits no edema.  Lymphadenopathy:    He has no cervical adenopathy.  Neurological: He is oriented to person, place, and time. He exhibits normal muscle tone. Coordination normal.  Skin: No rash noted. No erythema.  Psychiatric: He has a normal mood and affect. His behavior is normal.    ED Course  Procedures (including critical care time) Labs Review Labs Reviewed  COMPREHENSIVE METABOLIC PANEL - Abnormal; Notable for the following:    Sodium 133 (*)    Chloride 100 (*)    CO2 20 (*)    Glucose, Bld 185 (*)    BUN 33 (*)    Creatinine, Ser 1.95 (*)    Albumin 2.6 (*)    GFR calc non Af Amer 39 (*)    GFR calc Af Amer 45 (*)    All other components within normal limits  CBC - Abnormal; Notable for the following:    WBC 11.9 (*)    All other components within normal limits  URINALYSIS, ROUTINE W REFLEX MICROSCOPIC (NOT AT [facility]) - Abnormal; Notable for the following:    Color, Urine ORANGE (*)    APPearance TURBID (*)    Hgb urine dipstick SMALL (*)    Bilirubin Urine MODERATE (*)    Ketones, ur 15 (*)    Protein, ur 100 (*)    Nitrite POSITIVE (*)    Leukocytes, UA SMALL (*)    All other components within normal limits  URINE MICROSCOPIC-ADD ON - Abnormal; Notable for the following:    Squamous  Epithelial / LPF 0-5 (*)    Bacteria, UA MANY (*)    Casts HYALINE CASTS (*)    All other components within normal limits  URINE CULTURE  LIPASE, BLOOD  I-STAT TROPOININ, ED  I-STAT CG4 LACTIC ACID, ED    Imaging Review Ct Renal Stone Study  01/14/2015  CLINICAL DATA:  Bilateral groin pain for 5 days.  Hematuria. EXAM: CT ABDOMEN AND PELVIS WITHOUT CONTRAST TECHNIQUE: Multidetector CT imaging of the abdomen and pelvis was performed following the standard protocol without IV contrast. COMPARISON:  None. FINDINGS: Small left pleural effusion. Left lower lobe atelectasis or  infiltrate. Cannot exclude pneumonia. Right lung bases clear. Heart is upper limits normal in size. Liver, spleen, pancreas, adrenals and kidneys have an unremarkable unenhanced appearance. Gallbladder unremarkable. There is extensive pelvic and left retroperitoneal gas. Inflammatory changes in the left retroperitoneum and surrounding the descending colon and sigmoid colon. The gas appears to communicate with a diverticulum in the proximal sigmoid colon there is an area of wall thickening. Findings most compatible with perforated diverticulitis. Trace free fluid in the cul-de-sac. Stomach and small bowel and appendix are normal. Small umbilical hernia containing fat. Small bilateral inguinal hernias containing fat. No acute bony abnormality. IMPRESSION: Extensive gas throughout the left retroperitoneum with extensive stranding around the descending colon and sigmoid colon. This appears to be related to perforated diverticulitis in the proximal sigmoid colon. Small left pleural effusion. Left lower lobe atelectasis or infiltrate. Cannot exclude pneumonia. Electronically Signed   By: Rolm Baptise M.D.   On: 01/14/2015 12:10   I have personally reviewed and evaluated these images and lab results as part of my medical decision-making.   EKG Interpretation None     CRITICAL CARE Performed by: Josuha Fontanez L Total critical care  time: 40 minutes Critical care time was exclusive of separately billable procedures and treating other patients. Critical care was necessary to treat or prevent imminent or life-threatening deterioration. Critical care was time spent personally by me on the following activities: development of treatment plan with patient and/or surrogate as well as nursing, discussions with consultants, evaluation of patient's response to treatment, examination of patient, obtaining history from patient or surrogate, ordering and performing treatments and interventions, ordering and review of laboratory studies, ordering and review of radiographic studies, pulse oximetry and re-evaluation of patient's condition.  MDM   Final diagnoses:  Pain  Diverticulitis of large intestine with perforation without bleeding   perforated diverticulitis. Patient will be seen by  general surgery  An internal medicine will admit and take care of his rapid A. fib     Milton Ferguson, MD 01/14/15 1240

## 2015-01-14 NOTE — Progress Notes (Signed)
UR COMPLETED  

## 2015-01-14 NOTE — Progress Notes (Signed)
ANTIBIOTIC CONSULT NOTE - INITIAL  Pharmacy Consult for Zosyn Indication: Perforated diverticulitis.  Allergies  Allergen Reactions  . Contrast Media [Iodinated Diagnostic Agents]     a diffuse macular rash after CTA chest     Patient Measurements: Height: 5\' 11"  (180.3 cm) Weight: 294 lb (133.358 kg) IBW/kg (Calculated) : 75.3 Adjusted Body Weight: 98.2 kg  Vital Signs: Temp: 98.7 F (37.1 C) (12/28 0843) Temp Source: Oral (12/28 0843) BP: 101/72 mmHg (12/28 1130) Pulse Rate: 134 (12/28 1130) Intake/Output from previous day:   Intake/Output from this shift:    Labs:  Recent Labs  01/14/15 0906  WBC 11.9*  HGB 15.9  PLT 214  CREATININE 1.95*   Estimated Creatinine Clearance: 65.2 mL/min (by C-G formula based on Cr of 1.95). No results for input(s): VANCOTROUGH, VANCOPEAK, VANCORANDOM, GENTTROUGH, GENTPEAK, GENTRANDOM, TOBRATROUGH, TOBRAPEAK, TOBRARND, AMIKACINPEAK, AMIKACINTROU, AMIKACIN in the last 72 hours.   Microbiology: No results found for this or any previous visit (from the past 720 hour(s)).  Medical History: Past Medical History  Diagnosis Date  . Chicken pox as a child  . Migraine 06/29/2011  . Obesity 06/29/2011  . Depression with anxiety 06/29/2011  . Atrial fibrillation ([place])     admx 11/13 with acute sCHF in setting of RVR  => a. failed DCCV x 2; b. Pradaxa started;  c. failed sotalol  . Chronic systolic heart failure ([place])     a. echo 11/13: Ef 40-45%, diff HK, mod MR, mod LAE, mild RVE, mod RAE, small effusion;   b. TEE 11/13:  EF 35-40%, no LAA clot; Echo 2/14 shows normal EF  . Cardiomyopathy ([place])     likely tachy mediated in setting of AF with RVR  . Snoring     patient needs sleep study - has declined  . Allergy to IVP dye   . Chronic anticoagulation     Pradaxa   Assessment: 49 yo M presented to the ED with abdominal pain found to have perforated diverticulits. Pharmacy consulted to start Zosyn.  Patient is afebrile, WBC 11.9, SCr  1.95, CrCl ~65 ml/min  Goal of Therapy:  Eradication of infection  Plan:  - Zosyn 3.375g IV q8h - Follow up C&S, vitals and clinical progression  Dimitri Ped, PharmD. PGY-1 Pharmacy Resident Pager: [phone]  01/14/2015,12:42 PM

## 2015-01-14 NOTE — Consult Note (Signed)
Reason for Consult: perforated sigmoid colon Referring Physician: Dr. Milton Ferguson   HPI: William Fischer is a 49 year old male with a history of atrial fibrillation on pradaxa, CHF, HTN, obesity presenting with a a 5 day history of left lower quadrant abdominal pain.  Onset was sudden.  Coarse is worsening.  Time pattern is constant.  Modifying factors include; advil.  No aggravating or alleviating factors.  Associated with fever, chills, sweats, malaise and poor appetite.  Denies melena or hematochezia.  Has never had a colonoscopy.  Denies recent weight loss or family history of colon cancer.  Last dose of pradaxa was at 0600.  Last oral intake was yesterday.  CT shows a perforated sigmoid colon.  WBC 11.9k, sCr 1.95, UA positive.  We have been asked to evaluate for this reason.   Past Medical History  Diagnosis Date  . Chicken pox as a child  . Migraine 06/29/2011  . Obesity 06/29/2011  . Depression with anxiety 06/29/2011  . Atrial fibrillation ([place])     admx 11/13 with acute sCHF in setting of RVR  => a. failed DCCV x 2; b. Pradaxa started;  c. failed sotalol  . Chronic systolic heart failure ([place])     a. echo 11/13: Ef 40-45%, diff HK, mod MR, mod LAE, mild RVE, mod RAE, small effusion;   b. TEE 11/13:  EF 35-40%, no LAA clot; Echo 2/14 shows normal EF  . Cardiomyopathy ([place])     likely tachy mediated in setting of AF with RVR  . Snoring     patient needs sleep study - has declined  . Allergy to IVP dye   . Chronic anticoagulation     Pradaxa    Past Surgical History  Procedure Laterality Date  . Tee without cardioversion  11/30/2011    Procedure: TRANSESOPHAGEAL ECHOCARDIOGRAM (TEE);  Surgeon: Thayer Headings, MD;  Location: [place];  Service: Cardiovascular;  Laterality: N/A;  . Cardioversion  11/30/2011    Procedure: CARDIOVERSION;  Surgeon: Thayer Headings, MD;  Location: [place];  Service: Cardiovascular;  Laterality: N/A;  . Cardioversion  12/03/2011    Procedure:  CARDIOVERSION;  Surgeon: Jolaine Artist, MD;  Location: [facility];  Service: Cardiovascular;  Laterality: N/A;    Family History  Problem Relation Age of Onset  . Hypertension Mother   . Hypertension Father   . Aneurysm Father   . Alcohol abuse Father   . Cancer Maternal Grandmother     brain  . Diabetes Maternal Grandfather     type 2  . Parkinsonism Maternal Grandfather   . Cancer Paternal Grandmother     breast  . Diabetes Paternal Grandmother   . Alcohol abuse Paternal Grandfather     Social History:  reports that he has been smoking Cigarettes.  He has a 20 pack-year smoking history. He has never used smokeless tobacco. He reports that he does not drink alcohol or use illicit drugs.  Allergies:  Allergies  Allergen Reactions  . Contrast Media [Iodinated Diagnostic Agents]     a diffuse macular rash after CTA chest     Medications:  Scheduled Meds: . heparin  5,000 Units Subcutaneous 3 times per day   Continuous Infusions: . sodium chloride    . piperacillin-tazobactam (ZOSYN)  IV     PRN Meds:.morphine injection, ondansetron **OR** ondansetron (ZOFRAN) IV   Results for orders placed or performed during the hospital encounter of 01/14/15 (from the past 48 hour(s))  Lipase, blood  Status: None   Collection Time: 01/14/15  9:06 AM  Result Value Ref Range   Lipase 18 11 - 51 U/L  Comprehensive metabolic panel     Status: Abnormal   Collection Time: 01/14/15  9:06 AM  Result Value Ref Range   Sodium 133 (L) 135 - 145 mmol/L   Potassium 3.5 3.5 - 5.1 mmol/L   Chloride 100 (L) 101 - 111 mmol/L   CO2 20 (L) 22 - 32 mmol/L   Glucose, Bld 185 (H) 65 - 99 mg/dL   BUN 33 (H) 6 - 20 mg/dL   Creatinine, Ser 1.95 (H) 0.61 - 1.24 mg/dL   Calcium 8.9 8.9 - 10.3 mg/dL   Total Protein 6.7 6.5 - 8.1 g/dL   Albumin 2.6 (L) 3.5 - 5.0 g/dL   AST 22 15 - 41 U/L   ALT 21 17 - 63 U/L   Alkaline Phosphatase 85 38 - 126 U/L   Total Bilirubin 1.2 0.3 - 1.2 mg/dL   GFR calc  non Af Amer 39 (L) >60 mL/min   GFR calc Af Amer 45 (L) >60 mL/min    Comment: (NOTE) The eGFR has been calculated using the CKD EPI equation. This calculation has not been validated in all clinical situations. eGFR's persistently <60 mL/min signify possible Chronic Kidney Disease.    Anion gap 13 5 - 15  CBC     Status: Abnormal   Collection Time: 01/14/15  9:06 AM  Result Value Ref Range   WBC 11.9 (H) 4.0 - 10.5 K/uL   RBC 4.97 4.22 - 5.81 MIL/uL   Hemoglobin 15.9 13.0 - 17.0 g/dL   HCT 45.3 39.0 - 52.0 %   MCV 91.1 78.0 - 100.0 fL   MCH 32.0 26.0 - 34.0 pg   MCHC 35.1 30.0 - 36.0 g/dL   RDW 14.0 11.5 - 15.5 %   Platelets 214 150 - 400 K/uL  I-stat troponin, ED     Status: None   Collection Time: 01/14/15  9:45 AM  Result Value Ref Range   Troponin i, poc 0.01 0.00 - 0.08 ng/mL   Comment 3            Comment: Due to the release kinetics of cTnI, a negative result within the first hours of the onset of symptoms does not rule out myocardial infarction with certainty. If myocardial infarction is still suspected, repeat the test at appropriate intervals.   Urinalysis, Routine w reflex microscopic (not at [facility])     Status: Abnormal   Collection Time: 01/14/15 11:33 AM  Result Value Ref Range   Color, Urine ORANGE (A) YELLOW    Comment: BIOCHEMICALS MAY BE AFFECTED BY COLOR   APPearance TURBID (A) CLEAR   Specific Gravity, Urine 1.024 1.005 - 1.030   pH 5.0 5.0 - 8.0   Glucose, UA NEGATIVE NEGATIVE mg/dL   Hgb urine dipstick SMALL (A) NEGATIVE   Bilirubin Urine MODERATE (A) NEGATIVE   Ketones, ur 15 (A) NEGATIVE mg/dL   Protein, ur 100 (A) NEGATIVE mg/dL   Nitrite POSITIVE (A) NEGATIVE   Leukocytes, UA SMALL (A) NEGATIVE  Urine microscopic-add on     Status: Abnormal   Collection Time: 01/14/15 11:33 AM  Result Value Ref Range   Squamous Epithelial / LPF 0-5 (A) NONE SEEN   WBC, UA 6-30 0 - 5 WBC/hpf   RBC / HPF 0-5 0 - 5 RBC/hpf   Bacteria, UA MANY (A) NONE SEEN    Casts HYALINE CASTS (  A) NEGATIVE    Comment: GRANULAR CAST   Urine-Other AMORPHOUS URATES/PHOSPHATES   I-Stat CG4 Lactic Acid, ED     Status: None   Collection Time: 01/14/15 12:37 PM  Result Value Ref Range   Lactic Acid, Venous 1.39 0.5 - 2.0 mmol/L    Ct Renal Stone Study  01/14/2015  CLINICAL DATA:  Bilateral groin pain for 5 days.  Hematuria. EXAM: CT ABDOMEN AND PELVIS WITHOUT CONTRAST TECHNIQUE: Multidetector CT imaging of the abdomen and pelvis was performed following the standard protocol without IV contrast. COMPARISON:  None. FINDINGS: Small left pleural effusion. Left lower lobe atelectasis or infiltrate. Cannot exclude pneumonia. Right lung bases clear. Heart is upper limits normal in size. Liver, spleen, pancreas, adrenals and kidneys have an unremarkable unenhanced appearance. Gallbladder unremarkable. There is extensive pelvic and left retroperitoneal gas. Inflammatory changes in the left retroperitoneum and surrounding the descending colon and sigmoid colon. The gas appears to communicate with a diverticulum in the proximal sigmoid colon there is an area of wall thickening. Findings most compatible with perforated diverticulitis. Trace free fluid in the cul-de-sac. Stomach and small bowel and appendix are normal. Small umbilical hernia containing fat. Small bilateral inguinal hernias containing fat. No acute bony abnormality. IMPRESSION: Extensive gas throughout the left retroperitoneum with extensive stranding around the descending colon and sigmoid colon. This appears to be related to perforated diverticulitis in the proximal sigmoid colon. Small left pleural effusion. Left lower lobe atelectasis or infiltrate. Cannot exclude pneumonia. Electronically Signed   By: Rolm Baptise M.D.   On: 01/14/2015 12:10    Review of Systems  Constitutional: Positive for fever, chills, malaise/fatigue and diaphoresis. Negative for weight loss.  Respiratory: Positive for shortness of breath.  Negative for cough, hemoptysis, sputum production and wheezing.   Cardiovascular: Positive for chest pain and palpitations. Negative for orthopnea, claudication, leg swelling and PND.  Gastrointestinal: Negative for heartburn, nausea, vomiting, diarrhea, constipation, blood in stool and melena.  Genitourinary: Positive for dysuria and hematuria. Negative for urgency, frequency and flank pain.  Musculoskeletal: Negative for myalgias, back pain, joint pain, falls and neck pain.  Neurological: Positive for weakness. Negative for dizziness, tingling, tremors, sensory change, speech change, focal weakness, seizures, loss of consciousness and headaches.  Psychiatric/Behavioral: Negative for substance abuse.   Blood pressure 101/72, pulse 134, temperature 98.7 F (37.1 C), temperature source Oral, resp. rate 24, height 5' 11"  (1.803 m), weight 133.358 kg (294 lb), SpO2 92 %. Physical Exam  Constitutional: He is oriented to person, place, and time. He appears well-developed and well-nourished. No distress.  Cardiovascular: Exam reveals no gallop and no friction rub.   No murmur heard. S1S2 irregularly irregular   Respiratory: Effort normal and breath sounds normal. No respiratory distress. He has no wheezes. He has no rales. He exhibits no tenderness.  GI: Soft. Bowel sounds are normal. He exhibits distension. He exhibits no mass. There is no rebound.  TTP with voluntary guarding to the left lower quadrant.  Musculoskeletal: Normal range of motion. He exhibits no edema or tenderness.  Neurological: He is alert and oriented to person, place, and time.  Skin: Skin is warm. No rash noted. He is diaphoretic. No erythema. No pallor.  Psychiatric: He has a normal mood and affect. His behavior is normal. Judgment and thought content normal.    Assessment/Plan: Perforated sigmoid diverticulitis-exhibits localized tenderness.  Lactic acid 1.39.  WBC 11.9k. There are no indications for urgent surgery at  present time.  Follow closely.  Should he  clinically decline or develop peritoneal signs, then he would require a exploratory laparotomy/hartman's.  Hope to avoid this given medical problems and anticoagulation.  Recommend ICU admission, zosyn, bowel rest, repeat labs in AM and hold pradaxa. AKI/UTI-IV hydration and foley per primary team Atrial fibrillation-hold pradaxa(last dose 0600 12/28)   William Fischer ANP-BC Pager [phone] 01/14/2015, 12:46 PM

## 2015-01-14 NOTE — H&P (Signed)
Date: 01/14/2015               Patient Name:  William Fischer MRN: [id number]  DOB: [birth year] Age / Sex: 49 y.o., male   PCP: Thompson Grayer, MD         Medical Service: Internal Medicine Teaching Service         Attending Physician: Dr. Aldine Contes, MD    First Contact: Dr. Marijean Bravo Pager: [phone]  Second Contact: Dr. Randell Patient Pager: [phone]       After Hours (After 5p/  First Contact Pager: [phone]  weekends / holidays): Second Contact Pager: [phone]   Chief Complaint: Abdominal Pain  History of Present Illness:   William Fischer is a 49 year old man with PMH of atrial fibrillation (CHA2DS2-VASc 2 on Pradaxa), CHF (EF 55-60%), and obesity who presents with a five day history of groin and LLQ pain. The pain started five days ago and came on suddenly. He cannot recall anything he was doing in particular at the time. He cannot identify and alleviating or aggravating factors, but does note that it has been progressively worsening over the past five days, leading him to decide that he should come to the hospital. Associated symptoms are fever, chills, sweats, and racing heart rate. He's had some shortness of breath at night, denying orthopnea or PND. He has felt some non-radiating chest pain as well. He endorses some nausea, decreased appetite, and some difficulty urinating. He also describes to onset numbness in his left leg, and noted "some blood in his urine" while in the ED. Otherwise, he denies cough, vomiting, diarrhea, constipation, melena, hematochezia, sore throat, loss of consciousness, light-headedness, new one-sided weakness, or confusion. He has never had a colonoscopy. He lives at home with his two sons, and his main contact his is his mother [name] [phone]). He does not smoke, drink, or use illicit substances.   In the ED, A CT of the abdomen demonstrated a perforated sigmoid colon. He had a leukocytosis to 11.9, Cr1.95., and a +Nitrite, +Leukocytes. He was evaluated by  surgery for a perforated sigmoid diverticulitis who did not recognize any indication for urgent surgery, recommending zosyn, bowel rest, and holding pradaxa.   Meds: Current Facility-Administered Medications  Medication Dose Route Frequency Provider Last Rate Last Dose  . 0.9 %  sodium chloride infusion   Intravenous Continuous Milagros Loll, MD 150 mL/hr at 01/14/15 1324    . heparin injection 5,000 Units  5,000 Units Subcutaneous 3 times per day Milagros Loll, MD      . morphine 2 MG/ML injection 1 mg  1 mg Intravenous Q3H PRN Milagros Loll, MD   1 mg at 01/14/15 1321  . ondansetron (ZOFRAN) tablet 4 mg  4 mg Oral Q6H PRN Milagros Loll, MD       Or  . ondansetron [facility]) injection 4 mg  4 mg Intravenous Q6H PRN Milagros Loll, MD      . piperacillin-tazobactam (ZOSYN) IVPB 3.375 g  3.375 g Intravenous 3 times per day Nat Christen, PA-C 12.5 mL/hr at 01/14/15 1321 3.375 g at 01/14/15 1321   Current Outpatient Prescriptions  Medication Sig Dispense Refill  . metoprolol (LOPRESSOR) 50 MG tablet TAKE 1 & 1/2 TABLETS (75 MG TOTAL) BY MOUTH 2 (TWO) TIMES DAILY. 270 tablet 1  . PRADAXA 150 MG CAPS capsule TAKE 1 CAPSULE BY MOUTH EVERY 12 HOURS 60 capsule 10  . spironolactone (ALDACTONE) 25 MG tablet TAKE 1 TABLET BY  MOUTH EVERY DAY 30 tablet 10  . furosemide (LASIX) 20 MG tablet TAKE 1 TABLET BY MOUTH EVERY DAY 30 tablet 10    Allergies: Allergies as of 01/14/2015 - Review Complete 01/14/2015  Allergen Reaction Noted  . Contrast media [iodinated diagnostic agents]  11/28/2011   Past Medical History  Diagnosis Date  . Chicken pox as a child  . Migraine 06/29/2011  . Obesity 06/29/2011  . Depression with anxiety 06/29/2011  . Atrial fibrillation ([place])     admx 11/13 with acute sCHF in setting of RVR  => a. failed DCCV x 2; b. Pradaxa started;  c. failed sotalol  . Chronic systolic heart failure ([place])     a. echo 11/13: Ef 40-45%, diff HK, mod MR, mod LAE, mild RVE, mod RAE,  small effusion;   b. TEE 11/13:  EF 35-40%, no LAA clot; Echo 2/14 shows normal EF  . Cardiomyopathy ([place])     likely tachy mediated in setting of AF with RVR  . Snoring     patient needs sleep study - has declined  . Allergy to IVP dye   . Chronic anticoagulation     Pradaxa   Past Surgical History  Procedure Laterality Date  . Tee without cardioversion  11/30/2011    Procedure: TRANSESOPHAGEAL ECHOCARDIOGRAM (TEE);  Surgeon: Thayer Headings, MD;  Location: [place];  Service: Cardiovascular;  Laterality: N/A;  . Cardioversion  11/30/2011    Procedure: CARDIOVERSION;  Surgeon: Thayer Headings, MD;  Location: [place];  Service: Cardiovascular;  Laterality: N/A;  . Cardioversion  12/03/2011    Procedure: CARDIOVERSION;  Surgeon: Jolaine Artist, MD;  Location: [facility] OR;  Service: Cardiovascular;  Laterality: N/A;   Family History  Problem Relation Age of Onset  . Hypertension Mother   . Hypertension Father   . Aneurysm Father   . Alcohol abuse Father   . Cancer Maternal Grandmother     brain  . Diabetes Maternal Grandfather     type 2  . Parkinsonism Maternal Grandfather   . Cancer Paternal Grandmother     breast  . Diabetes Paternal Grandmother   . Alcohol abuse Paternal Grandfather    Social History   Social History  . Marital Status: Divorced    Spouse Name: N/A  . Number of Children: N/A  . Years of Education: N/A   Occupational History  . Not on file.   Social History Main Topics  . Smoking status: Current Every Day Smoker -- 1.00 packs/day for 20 years    Types: Cigarettes  . Smokeless tobacco: Never Used  . Alcohol Use: No  . Drug Use: No  . Sexual Activity: No   Other Topics Concern  . Not on file   Social History Narrative    Review of Systems: Negative except per HPI  Physical Exam: Blood pressure 101/72, pulse 134, temperature 98.7 F (37.1 C), temperature source Oral, resp. rate 24, height 5\' 11"  (1.803 m), weight 294 lb (133.358  kg), SpO2 92 %. General: Lying bed, diaphoretic. HEENT: Dry mucous membranes, no tonsillar erythema, no JVD. PERRL EOMI. Cardiovascular: Irregularly irregular rhythm, tachycardic. No murmurs, rubs, or gallops appreciated Pulmonary: Clear to auscultation bilaterally Abdominal: Distended, diffusely tender to palpation, especially in the LLQ. Bowel sounds normal Genitourinary: No hernia appreciated Extremities: No clubbing, cyanosis, or edema Neurological: Alert and oriented x3. Slightly diminished sensation on right quadricep, but preserved in distal extremities. Normal strength. Tongue midline, face symmetric. Psychiatric: Appropriate behavior and affect.  Lab  results: Basic Metabolic Panel:  Recent Labs  01/14/15 0906  NA 133*  K 3.5  CL 100*  CO2 20*  GLUCOSE 185*  BUN 33*  CREATININE 1.95*  CALCIUM 8.9   Liver Function Tests:  Recent Labs  01/14/15 0906  AST 22  ALT 21  ALKPHOS 85  BILITOT 1.2  PROT 6.7  ALBUMIN 2.6*    Recent Labs  01/14/15 0906  LIPASE 18   CBC:  Recent Labs  01/14/15 0906  WBC 11.9*  HGB 15.9  HCT 45.3  MCV 91.1  PLT 214   CUrinalysis:  Recent Labs  01/14/15 1133  COLORURINE ORANGE*  LABSPEC 1.024  PHURINE 5.0  GLUCOSEU NEGATIVE  HGBUR SMALL*  BILIRUBINUR MODERATE*  KETONESUR 15*  PROTEINUR 100*  NITRITE POSITIVE*  LEUKOCYTESUR SMALL*   Imaging results:  Ct Renal Stone Study  01/14/2015  CLINICAL DATA:  Bilateral groin pain for 5 days.  Hematuria. EXAM: CT ABDOMEN AND PELVIS WITHOUT CONTRAST TECHNIQUE: Multidetector CT imaging of the abdomen and pelvis was performed following the standard protocol without IV contrast. COMPARISON:  None. FINDINGS: Small left pleural effusion. Left lower lobe atelectasis or infiltrate. Cannot exclude pneumonia. Right lung bases clear. Heart is upper limits normal in size. Liver, spleen, pancreas, adrenals and kidneys have an unremarkable unenhanced appearance. Gallbladder unremarkable.  There is extensive pelvic and left retroperitoneal gas. Inflammatory changes in the left retroperitoneum and surrounding the descending colon and sigmoid colon. The gas appears to communicate with a diverticulum in the proximal sigmoid colon there is an area of wall thickening. Findings most compatible with perforated diverticulitis. Trace free fluid in the cul-de-sac. Stomach and small bowel and appendix are normal. Small umbilical hernia containing fat. Small bilateral inguinal hernias containing fat. No acute bony abnormality. IMPRESSION: Extensive gas throughout the left retroperitoneum with extensive stranding around the descending colon and sigmoid colon. This appears to be related to perforated diverticulitis in the proximal sigmoid colon. Small left pleural effusion. Left lower lobe atelectasis or infiltrate. Cannot exclude pneumonia. Electronically Signed   By: Rolm Baptise M.D.   On: 01/14/2015 12:10    Other results: EKG: Sinus tachycardia with irregular rate  Assessment & Plan by Problem:  Perforated sigmoid diverticulitis: Surgery following and does not recommend urgent surgery at this time, with hopes that antibiotics alone will be sufficient. Nonetheless, he may require either surgery or drainage by IR. There is a low threshold to consult surgery if his condition deteriorates or has peritoneal signs.  - Zosyn dosing per pharmacy - Morphine 1 mg q3h prn - Zofran prn - Admit to Step down - 150 cc/hr NS - NPO   Atrial Fibrillation: [place]-Vasc 2 on pradaxa, holding in case of urgent surgery. Rates have been in the 130s, but it appear to be irregular sinus tachycardia. On metoprolol 75 mg BID at home. - Diltiazem drip - Hold pradaxa   UTI: Zosyn per pharm  AKI: Baseline Cr around 1. Now 1.95 in setting of poor PO intake. Likely prerenal.   DVT prophylaxis: Heparin [place] Code Status: Full, will readdress   Dispo: Disposition is deferred at this time, awaiting improvement of current  medical problems. Anticipated discharge in approximately 5-7 day(s).   The patient does have a current PCP Thompson Grayer, MD) and does not need an [facility] hospital follow-up appointment after discharge.  The patient does have transportation limitations that hinder transportation to clinic appointments.  Signed: Liberty Handy, MD 01/14/2015, 2:10 PM

## 2015-01-14 NOTE — ED Notes (Signed)
Report attempted 

## 2015-01-14 NOTE — Progress Notes (Addendum)
Foley removed per verbal MD order upon pt request.  Pt due to void.  Urinal placed at bedside.  Will continue to monitor patient.

## 2015-01-14 NOTE — ED Notes (Signed)
Paged admitting provider to [place], [place].

## 2015-01-14 NOTE — ED Notes (Signed)
Pt reports lower abdominal and groin pain. Pt reports difficulty urinating as well.

## 2015-01-15 ENCOUNTER — Inpatient Hospital Stay (HOSPITAL_COMMUNITY): Payer: Medicaid Other

## 2015-01-15 LAB — CBC
HEMATOCRIT: 45.6 % (ref 39.0–52.0)
Hemoglobin: 15.7 g/dL (ref 13.0–17.0)
MCH: 31.8 pg (ref 26.0–34.0)
MCHC: 34.4 g/dL (ref 30.0–36.0)
MCV: 92.5 fL (ref 78.0–100.0)
Platelets: 221 10*3/uL (ref 150–400)
RBC: 4.93 MIL/uL (ref 4.22–5.81)
RDW: 14.5 % (ref 11.5–15.5)
WBC: 18.3 10*3/uL — ABNORMAL HIGH (ref 4.0–10.5)

## 2015-01-15 LAB — BASIC METABOLIC PANEL
Anion gap: 13 (ref 5–15)
BUN: 29 mg/dL — AB (ref 6–20)
CALCIUM: 8.2 mg/dL — AB (ref 8.9–10.3)
CO2: 21 mmol/L — ABNORMAL LOW (ref 22–32)
Chloride: 103 mmol/L (ref 101–111)
Creatinine, Ser: 1.44 mg/dL — ABNORMAL HIGH (ref 0.61–1.24)
GFR calc Af Amer: >60 mL/min (ref >60)
GFR, EST NON AFRICAN AMERICAN: 57 mL/min — AB (ref >60)
GLUCOSE: 141 mg/dL — AB (ref 65–99)
Potassium: 3.6 mmol/L (ref 3.5–5.1)
Sodium: 137 mmol/L (ref 135–145)

## 2015-01-15 MED ORDER — METOPROLOL TARTRATE 1 MG/ML IV SOLN
10.0000 mg | Freq: Four times a day (QID) | INTRAVENOUS | Status: DC
  Administered 2015-01-15 – 2015-01-17 (×9): 10 mg via INTRAVENOUS
  Filled 2015-01-15 (×10): qty 10

## 2015-01-15 MED ORDER — BISACODYL 10 MG RE SUPP: 10.0000 mg | Freq: Every day | RECTAL | Status: DC | PRN

## 2015-01-15 MED ORDER — METOPROLOL TARTRATE 1 MG/ML IV SOLN
5.0000 mg | INTRAVENOUS | Status: DC | PRN
  Administered 2015-01-15: 5 mg via INTRAVENOUS
  Filled 2015-01-15: qty 5

## 2015-01-15 MED ORDER — METOPROLOL TARTRATE 1 MG/ML IV SOLN
5.0000 mg | Freq: Once | INTRAVENOUS | Status: AC
  Administered 2015-01-15: 5 mg via INTRAVENOUS
  Filled 2015-01-15: qty 5

## 2015-01-15 MED ORDER — DEXTROSE-NACL 5-0.45 % IV SOLN
INTRAVENOUS | Status: DC
  Administered 2015-01-15: 10:00:00 via INTRAVENOUS

## 2015-01-15 MED ORDER — ALBUTEROL SULFATE (2.5 MG/3ML) 0.083% IN NEBU
2.5000 mg | INHALATION_SOLUTION | RESPIRATORY_TRACT | Status: DC | PRN
  Administered 2015-01-15: 2.5 mg via RESPIRATORY_TRACT
  Filled 2015-01-15: qty 3

## 2015-01-15 MED ORDER — METOPROLOL TARTRATE 1 MG/ML IV SOLN: 5.0000 mg | INTRAVENOUS | Status: DC

## 2015-01-15 MED ORDER — LORAZEPAM 2 MG/ML IJ SOLN
0.5000 mg | Freq: Every evening | INTRAMUSCULAR | Status: DC | PRN
  Administered 2015-01-16 (×2): 0.5 mg via INTRAVENOUS
  Filled 2015-01-15 (×2): qty 1

## 2015-01-15 NOTE — Progress Notes (Addendum)
Physician notified: Audery Amel At: 0808  Regarding: fine crackles noted in RLL, RR 30+, shallow, labored. Bumped O2 to 5LPM, SpO2 89%. Appears anxious, no c/o SOB. Cont. IVF infusing @ 183ml/hr, PMH sCHF Awaiting return response.   Returned Response at: 0809  Order(s): Will see patient shortly.

## 2015-01-15 NOTE — Progress Notes (Signed)
Subjective:  William Fischer says that he feels "hot" and sweaty this morning, but otherwise he has no new complaints. He denies any abrupt severe abdominal pain, nausea, vomiting, or diarrhea. He denied any chest pain, leg swelling, or shortness of breath as well. We reiterated to him that if he were to notice any changes.  Objective: Vital signs in last 24 hours: Filed Vitals:   01/15/15 0811 01/15/15 1002 01/15/15 1200 01/15/15 1224  BP:   122/84   Pulse:   128   Temp: 98.5 F (36.9 C)   99.1 F (37.3 C)  TempSrc: Oral   Oral  Resp:   26   Height:      Weight:      SpO2:  90% 91%    Weight change:   Intake/Output Summary (Last 24 hours) at 01/15/15 1348 Last data filed at 01/15/15 1300  Gross per 24 hour  Intake 3544.58 ml  Output   2150 ml  Net 1394.58 ml   Physical Exam General: Lying bed, mildy diaphoretic. HEENT: Moist mucous membranes, no scleral icterus Cardiovascular: Irregularly irregular rhythm, tachycardic. No murmurs, rubs, or gallops appreciated Pulmonary: Scattered expiratory wheezes. No crackles appreciated.  Abdominal: Distended, diffusely tender to palpation, especially in the LLQ. Bowel sounds normal Extremities: No clubbing, cyanosis. Trace edema in lower extremities. Psychiatric: Appropriate behavior and affect.  Lab Results: Basic Metabolic Panel:  Recent Labs Lab 01/14/15 0906 01/15/15 0231  NA 133* 137  K 3.5 3.6  CL 100* 103  CO2 20* 21*  GLUCOSE 185* 141*  BUN 33* 29*  CREATININE 1.95* 1.44*  CALCIUM 8.9 8.2*   Liver Function Tests:  Recent Labs Lab 01/14/15 0906  AST 22  ALT 21  ALKPHOS 85  BILITOT 1.2  PROT 6.7  ALBUMIN 2.6*    Recent Labs Lab 01/14/15 0906  LIPASE 18   CBC:  Recent Labs Lab 01/14/15 0906 01/15/15 0231  WBC 11.9* 18.3*  HGB 15.9 15.7  HCT 45.3 45.6  MCV 91.1 92.5  PLT 214 221   Urinalysis:  Recent Labs Lab 01/14/15 1133  COLORURINE ORANGE*  LABSPEC 1.024  PHURINE 5.0  GLUCOSEU  NEGATIVE  HGBUR SMALL*  BILIRUBINUR MODERATE*  KETONESUR 15*  PROTEINUR 100*  NITRITE POSITIVE*  LEUKOCYTESUR SMALL*    Micro Results: Recent Results (from the past 240 hour(s))  MRSA PCR Screening     Status: None   Collection Time: 01/14/15  6:05 PM  Result Value Ref Range Status   MRSA by PCR NEGATIVE NEGATIVE Final    Comment:        The GeneXpert MRSA Assay (FDA approved for NASAL specimens only), is one component of a comprehensive MRSA colonization surveillance program. It is not intended to diagnose MRSA infection nor to guide or monitor treatment for MRSA infections.    Studies/Results: Dg Chest Port 1 View  01/15/2015  CLINICAL DATA:  Hypoxia. EXAM: PORTABLE CHEST 1 VIEW COMPARISON:  November 30, 2011. FINDINGS: Stable cardiomegaly is noted. Central pulmonary vascular congestion is noted with mild bilateral perihilar and basilar interstitial densities concerning for possible edema. No pneumothorax is noted. Probable mild left pleural effusion is noted. Bony thorax is unremarkable. IMPRESSION: Cardiomegaly and central pulmonary vascular congestion is noted with probable mild bilateral perihilar and basilar pulmonary edema. Probable mild left pleural effusion is noted. Electronically Signed   By: Marijo Conception, M.D.   On: 01/15/2015 10:16   Ct Renal Stone Study  01/14/2015  CLINICAL DATA:  Bilateral groin pain  for 5 days.  Hematuria. EXAM: CT ABDOMEN AND PELVIS WITHOUT CONTRAST TECHNIQUE: Multidetector CT imaging of the abdomen and pelvis was performed following the standard protocol without IV contrast. COMPARISON:  None. FINDINGS: Small left pleural effusion. Left lower lobe atelectasis or infiltrate. Cannot exclude pneumonia. Right lung bases clear. Heart is upper limits normal in size. Liver, spleen, pancreas, adrenals and kidneys have an unremarkable unenhanced appearance. Gallbladder unremarkable. There is extensive pelvic and left retroperitoneal gas. Inflammatory  changes in the left retroperitoneum and surrounding the descending colon and sigmoid colon. The gas appears to communicate with a diverticulum in the proximal sigmoid colon there is an area of wall thickening. Findings most compatible with perforated diverticulitis. Trace free fluid in the cul-de-sac. Stomach and small bowel and appendix are normal. Small umbilical hernia containing fat. Small bilateral inguinal hernias containing fat. No acute bony abnormality. IMPRESSION: Extensive gas throughout the left retroperitoneum with extensive stranding around the descending colon and sigmoid colon. This appears to be related to perforated diverticulitis in the proximal sigmoid colon. Small left pleural effusion. Left lower lobe atelectasis or infiltrate. Cannot exclude pneumonia. Electronically Signed   By: Rolm Baptise M.D.   On: 01/14/2015 12:10   Medications: I have reviewed the patient's current medications. Scheduled Meds: . heparin  5,000 Units Subcutaneous 3 times per day  . Influenza vac split quadrivalent PF  0.5 mL Intramuscular Tomorrow-1000  . metoprolol  10 mg Intravenous 4 times per day  . piperacillin-tazobactam (ZOSYN)  IV  3.375 g Intravenous 3 times per day  . pneumococcal 23 valent vaccine  0.5 mL Intramuscular Tomorrow-1000   Continuous Infusions: . dextrose 5 % and 0.45% NaCl 75 mL/hr at 01/15/15 1006  . diltiazem (CARDIZEM) infusion 15 mg/hr (01/15/15 1200)   PRN Meds:.albuterol, morphine injection, ondansetron **OR** ondansetron (ZOFRAN) IV Assessment/Plan:  Perforated sigmoid diverticulitis: Surgery following and does not recommend urgent surgery at this time, with hopes that antibiotics alone will be sufficient. He continues to be stable on antibiotics. Nonetheless, he may require either surgery or drainage by IR if he worsens. There is a low threshold to consult surgery if his condition deteriorates or has peritoneal signs.  - Zosyn dosing per pharmacy - Morphine 1 mg q3h  prn - Zofran prn - 75 cc/hr D5-1/2 NS - NPO  Pulmonary Edema and Pleural Effusion: Identified on CXR, but no crackles appreciated on exam. Patient is NPO, and thus needs some maintenance fluids. He is satting ~90% on 3L, but he denies shortness of breath. He is at risk for ARDS, and low threshold to consult PCCM if respiratory status rapidly declines, although he is stable for now. If he becomes short of breath, we will stop fluids and initiate IV diuresis. - Incentive spirometry - Albuterol prn  Atrial Fibrillation: Chads-Vasc 2 on pradaxa, holding in case of urgent surgery. Rates have been in the 130s, but it appear to be irregular sinus tachycardia. On metoprolol 75 mg BID at home. - Diltiazem drip - Metoprolol 10 mg q6h - Hold pradaxa   Hyperglycemia: Fasting CBG to 185 - Hgb A1c pending  UTI: Zosyn per pharm.  AKI: Baseline Cr around 1. 1.95 in setting of poor PO intake on admission. Likely prerenal, and Cr improved to 1.44 on IVF - 75 cc/hr D5-1/2 NS  DVT prophylaxis: Heparin [place] Dispo: Disposition is deferred at this time, awaiting improvement of current medical problems.  Anticipated discharge in approximately 3-4 day(s).   The patient does have a current PCP Thompson Grayer,  MD) and does not need an [facility] hospital follow-up appointment after discharge.  The patient does have transportation limitations that hinder transportation to clinic appointments.  .Services Needed at time of discharge: Y = Yes, Blank = No PT:   OT:   RN:   Equipment:   Other:     LOS: 1 day   Liberty Handy, MD 01/15/2015, 1:48 PM

## 2015-01-15 NOTE — Progress Notes (Signed)
I went to re evaluate William Fischer with the on call resident Dr. Genene Churn. He is feeling well currently. He denies SOB and states his abd pain has resolved. He is requiring 5 L O2 via [place] and has mild bibasilar crackles on exam. Denies cough. His HR has improved to the 110s. Will d/c IVF and monitor closely. If his hypoxia worsens would consider IV lasix. He will need continued close monitoring in SDU. Case d/w on call resident and day resident in detail. Continue with other medical management as per earlier notes.

## 2015-01-15 NOTE — Progress Notes (Signed)
@  [id number] paged IMTS regarding HR 100s-130s and inquiring about amio/lopressor increase  @0350  page returned. Metoprolol IV changed to Q4H.

## 2015-01-15 NOTE — Progress Notes (Signed)
Nutrition Brief Note  Patient identified on the Malnutrition Screening Tool (MST) Report.  Wt Readings from Last 15 Encounters:  01/14/15 293 lb 3.4 oz (133 kg)  06/09/14 292 lb (132.45 kg)  02/27/14 294 lb (133.358 kg)  07/16/13 291 lb (131.997 kg)  04/15/13 282 lb (127.914 kg)  03/06/13 276 lb (125.193 kg)  09/04/12 263 lb 12.8 oz (119.659 kg)  06/04/12 263 lb 12.8 oz (119.659 kg)  02/20/12 285 lb 6.4 oz (129.457 kg)  01/05/12 300 lb (136.079 kg)  12/08/11 306 lb 1.9 oz (138.855 kg)  12/04/11 309 lb 4.9 oz (140.3 kg)  06/27/11 320 lb 12.8 oz (145.514 kg)    Body mass index is 40.91 kg/(m^2). Patient meets criteria for Obesity Class III  based on current BMI.   Current diet order is NPO. Labs and medications reviewed.   No nutrition interventions warranted at this time. If nutrition issues arise, please consult RD.   Arthur Holms, RD, LDN Pager #: [phone] After-Hours Pager #: [phone]

## 2015-01-15 NOTE — Progress Notes (Signed)
Patient ID: William Fischer, male   DOB: 1965-02-23, 49 y.o.   MRN: [id number]     [address] [place], [place] [address]    Phone: [phone] FAX: [phone]     Subjective: No n/v, no abdominal pain.  Passing flatus, having BMs.  Remains af with rvr. Hypoxic, increased RR only +1.6L.  UOP down.   Afebrile. WBC increased 11.9k --->18.3k.   Objective:  Vital signs:  Filed Vitals:   01/15/15 0600 01/15/15 0700 01/15/15 0800 01/15/15 0811  BP: 104/67 112/87 111/83   Pulse: 136 129 127   Temp:    98.5 F (36.9 C)  TempSrc:    Oral  Resp: 34 39 37   Height:      Weight:      SpO2: 90% 89% 89%        Intake/Output   Yesterday:  12/28 0701 - 12/29 0700 In: 3177.1 [I.V.:3027.1; IV Piggyback:150] Out: 0350 [Urine:1650] This shift:  Total I/O In: 165 [I.V.:165] Out: -    Physical Exam: General: Pt awake/alert/oriented x4 in mild acute distress Chest: crackles LLB.  CV:  s1s2 irreg rate and rhythm Abdomen: +Bs, abdomen is soft and non tender.    Problem List:   Active Problems:   Diverticulitis of colon with perforation    Results:   Labs: Results for orders placed or performed during the hospital encounter of 01/14/15 (from the past 48 hour(s))  Lipase, blood     Status: None   Collection Time: 01/14/15  9:06 AM  Result Value Ref Range   Lipase 18 11 - 51 U/L  Comprehensive metabolic panel     Status: Abnormal   Collection Time: 01/14/15  9:06 AM  Result Value Ref Range   Sodium 133 (L) 135 - 145 mmol/L   Potassium 3.5 3.5 - 5.1 mmol/L   Chloride 100 (L) 101 - 111 mmol/L   CO2 20 (L) 22 - 32 mmol/L   Glucose, Bld 185 (H) 65 - 99 mg/dL   BUN 33 (H) 6 - 20 mg/dL   Creatinine, Ser 1.95 (H) 0.61 - 1.24 mg/dL   Calcium 8.9 8.9 - 10.3 mg/dL   Total Protein 6.7 6.5 - 8.1 g/dL   Albumin 2.6 (L) 3.5 - 5.0 g/dL   AST 22 15 - 41 U/L   ALT 21 17 - 63 U/L   Alkaline Phosphatase 85 38 - 126  U/L   Total Bilirubin 1.2 0.3 - 1.2 mg/dL   GFR calc non Af Amer 39 (L) >60 mL/min   GFR calc Af Amer 45 (L) >60 mL/min    Comment: (NOTE) The eGFR has been calculated using the CKD EPI equation. This calculation has not been validated in all clinical situations. eGFR's persistently <60 mL/min signify possible Chronic Kidney Disease.    Anion gap 13 5 - 15  CBC     Status: Abnormal   Collection Time: 01/14/15  9:06 AM  Result Value Ref Range   WBC 11.9 (H) 4.0 - 10.5 K/uL   RBC 4.97 4.22 - 5.81 MIL/uL   Hemoglobin 15.9 13.0 - 17.0 g/dL   HCT 45.3 39.0 - 52.0 %   MCV 91.1 78.0 - 100.0 fL   MCH 32.0 26.0 - 34.0 pg   MCHC 35.1 30.0 - 36.0 g/dL   RDW 14.0 11.5 - 15.5 %   Platelets 214 150 - 400 K/uL  I-stat troponin, ED  Status: None   Collection Time: 01/14/15  9:45 AM  Result Value Ref Range   Troponin i, poc 0.01 0.00 - 0.08 ng/mL   Comment 3            Comment: Due to the release kinetics of cTnI, a negative result within the first hours of the onset of symptoms does not rule out myocardial infarction with certainty. If myocardial infarction is still suspected, repeat the test at appropriate intervals.   Urinalysis, Routine w reflex microscopic (not at [facility])     Status: Abnormal   Collection Time: 01/14/15 11:33 AM  Result Value Ref Range   Color, Urine ORANGE (A) YELLOW    Comment: BIOCHEMICALS MAY BE AFFECTED BY COLOR   APPearance TURBID (A) CLEAR   Specific Gravity, Urine 1.024 1.005 - 1.030   pH 5.0 5.0 - 8.0   Glucose, UA NEGATIVE NEGATIVE mg/dL   Hgb urine dipstick SMALL (A) NEGATIVE   Bilirubin Urine MODERATE (A) NEGATIVE   Ketones, ur 15 (A) NEGATIVE mg/dL   Protein, ur 100 (A) NEGATIVE mg/dL   Nitrite POSITIVE (A) NEGATIVE   Leukocytes, UA SMALL (A) NEGATIVE  Urine microscopic-add on     Status: Abnormal   Collection Time: 01/14/15 11:33 AM  Result Value Ref Range   Squamous Epithelial / LPF 0-5 (A) NONE SEEN   WBC, UA 6-30 0 - 5 WBC/hpf   RBC / HPF  0-5 0 - 5 RBC/hpf   Bacteria, UA MANY (A) NONE SEEN   Casts HYALINE CASTS (A) NEGATIVE    Comment: GRANULAR CAST   Urine-Other AMORPHOUS URATES/PHOSPHATES   I-Stat CG4 Lactic Acid, ED     Status: None   Collection Time: 01/14/15 12:37 PM  Result Value Ref Range   Lactic Acid, Venous 1.39 0.5 - 2.0 mmol/L  MRSA PCR Screening     Status: None   Collection Time: 01/14/15  6:05 PM  Result Value Ref Range   MRSA by PCR NEGATIVE NEGATIVE    Comment:        The GeneXpert MRSA Assay (FDA approved for NASAL specimens only), is one component of a comprehensive MRSA colonization surveillance program. It is not intended to diagnose MRSA infection nor to guide or monitor treatment for MRSA infections.   Basic metabolic panel     Status: Abnormal   Collection Time: 01/15/15  2:31 AM  Result Value Ref Range   Sodium 137 135 - 145 mmol/L   Potassium 3.6 3.5 - 5.1 mmol/L   Chloride 103 101 - 111 mmol/L   CO2 21 (L) 22 - 32 mmol/L   Glucose, Bld 141 (H) 65 - 99 mg/dL   BUN 29 (H) 6 - 20 mg/dL   Creatinine, Ser 1.44 (H) 0.61 - 1.24 mg/dL   Calcium 8.2 (L) 8.9 - 10.3 mg/dL   GFR calc non Af Amer 57 (L) >60 mL/min   GFR calc Af Amer >60 >60 mL/min    Comment: (NOTE) The eGFR has been calculated using the CKD EPI equation. This calculation has not been validated in all clinical situations. eGFR's persistently <60 mL/min signify possible Chronic Kidney Disease.    Anion gap 13 5 - 15  CBC     Status: Abnormal   Collection Time: 01/15/15  2:31 AM  Result Value Ref Range   WBC 18.3 (H) 4.0 - 10.5 K/uL   RBC 4.93 4.22 - 5.81 MIL/uL   Hemoglobin 15.7 13.0 - 17.0 g/dL   HCT 45.6 39.0 - 52.0 %  MCV 92.5 78.0 - 100.0 fL   MCH 31.8 26.0 - 34.0 pg   MCHC 34.4 30.0 - 36.0 g/dL   RDW 14.5 11.5 - 15.5 %   Platelets 221 150 - 400 K/uL    Imaging / Studies: Ct Renal Stone Study  01/14/2015  CLINICAL DATA:  Bilateral groin pain for 5 days.  Hematuria. EXAM: CT ABDOMEN AND PELVIS WITHOUT  CONTRAST TECHNIQUE: Multidetector CT imaging of the abdomen and pelvis was performed following the standard protocol without IV contrast. COMPARISON:  None. FINDINGS: Small left pleural effusion. Left lower lobe atelectasis or infiltrate. Cannot exclude pneumonia. Right lung bases clear. Heart is upper limits normal in size. Liver, spleen, pancreas, adrenals and kidneys have an unremarkable unenhanced appearance. Gallbladder unremarkable. There is extensive pelvic and left retroperitoneal gas. Inflammatory changes in the left retroperitoneum and surrounding the descending colon and sigmoid colon. The gas appears to communicate with a diverticulum in the proximal sigmoid colon there is an area of wall thickening. Findings most compatible with perforated diverticulitis. Trace free fluid in the cul-de-sac. Stomach and small bowel and appendix are normal. Small umbilical hernia containing fat. Small bilateral inguinal hernias containing fat. No acute bony abnormality. IMPRESSION: Extensive gas throughout the left retroperitoneum with extensive stranding around the descending colon and sigmoid colon. This appears to be related to perforated diverticulitis in the proximal sigmoid colon. Small left pleural effusion. Left lower lobe atelectasis or infiltrate. Cannot exclude pneumonia. Electronically Signed   By: Rolm Baptise M.D.   On: 01/14/2015 12:10    Medications / Allergies:  Scheduled Meds: . heparin  5,000 Units Subcutaneous 3 times per day  . Influenza vac split quadrivalent PF  0.5 mL Intramuscular Tomorrow-1000  . metoprolol  5 mg Intravenous 6 times per day  . piperacillin-tazobactam (ZOSYN)  IV  3.375 g Intravenous 3 times per day  . pneumococcal 23 valent vaccine  0.5 mL Intramuscular Tomorrow-1000   Continuous Infusions: . sodium chloride 150 mL/hr at 01/15/15 0649  . diltiazem (CARDIZEM) infusion 15 mg/hr (01/15/15 0800)   PRN Meds:.morphine injection, ondansetron **OR** ondansetron (ZOFRAN)  IV  Antibiotics: Anti-infectives    Start     Dose/Rate Route Frequency Ordered Stop   01/14/15 1245  piperacillin-tazobactam (ZOSYN) IVPB 3.375 g     3.375 g 12.5 mL/hr over 240 Minutes Intravenous 3 times per day 01/14/15 1238     01/14/15 1230  ciprofloxacin (CIPRO) IVPB 400 mg  Status:  Discontinued     400 mg 200 mL/hr over 60 Minutes Intravenous  Once 01/14/15 1218 01/14/15 1238   01/14/15 1230  metroNIDAZOLE (FLAGYL) IVPB 500 mg  Status:  Discontinued     500 mg 100 mL/hr over 60 Minutes Intravenous  Once 01/14/15 1218 01/14/15 1238        Assessment/Plan Perforated sigmoid diverticulitis-non tender on exam.  WBC increased, afebrile.   There are no indications for urgent surgery at present time. Follow closely. Should he clinically decline or develop peritoneal signs, then he would require a exploratory laparotomy/hartman's. Hope to avoid this given medical problems and anticoagulation. Hypoxia-tachypnic, tachy, crackles upon ausculation.  Stat CXR.  Probably needs lasix, d/w primary team ID-zosyn D#1 AKI-improving UTI-culture pending VTE prophylaxis-SCD/heparin Atrial fibrillation-hold pradaxa(last dose 0600 12/28)  FEN-keep NPO Dispo-continue SDU   Erby Pian, [facility] Pager [phone](7A-4:30P) For consults and floor pages call [phone](7A-4:30P)  01/15/2015 9:26 AM

## 2015-01-15 NOTE — Care Management Note (Signed)
Case Management Note  Patient Details  Name: William Fischer MRN: [id number] Date of Birth: 1966-01-01  Subjective/Objective:                 Admitted with sudden onset, severe abd pain secondary to perforated sigmoid diverticulitis, and afib with rvr. Resides with 45 and 11yr old sons. Independent with ADL's pta. No dme usage.    Action/Plan: Return to home when medically stable.CM to f/u with disposition needs.  Expected Discharge Date:                  Expected Discharge Plan:  Home/Self Care  In-House Referral:     Discharge planning Services  CM Consult  Post Acute Care Choice:    Choice offered to:     DME Arranged:    DME Agency:     HH Arranged:    HH Agency:     Status of Service:  In process, will continue to follow  Medicare Important Message Given:    Date Medicare IM Given:    Medicare IM give by:    Date Additional Medicare IM Given:    Additional Medicare Important Message give by:     If discussed at [place] of Stay Meetings, dates discussed:    Additional Comments:   Glee Arvin (Mother)  [phone]  Sharin Mons, [place] [phone] 01/15/2015, 11:30 AM

## 2015-01-16 ENCOUNTER — Inpatient Hospital Stay (HOSPITAL_COMMUNITY): Payer: Medicaid Other

## 2015-01-16 DIAGNOSIS — R0902 Hypoxemia: Secondary | ICD-10-CM | POA: Insufficient documentation

## 2015-01-16 DIAGNOSIS — I482 Chronic atrial fibrillation: Secondary | ICD-10-CM

## 2015-01-16 DIAGNOSIS — I4891 Unspecified atrial fibrillation: Secondary | ICD-10-CM

## 2015-01-16 LAB — CBC
HCT: 45.3 % (ref 39.0–52.0)
Hemoglobin: 15.9 g/dL (ref 13.0–17.0)
MCH: 32.3 pg (ref 26.0–34.0)
MCHC: 35.1 g/dL (ref 30.0–36.0)
MCV: 92.1 fL (ref 78.0–100.0)
PLATELETS: 240 10*3/uL (ref 150–400)
RBC: 4.92 MIL/uL (ref 4.22–5.81)
RDW: 15 % (ref 11.5–15.5)
WBC: 19.8 10*3/uL — AB (ref 4.0–10.5)

## 2015-01-16 LAB — BASIC METABOLIC PANEL
ANION GAP: 10 (ref 5–15)
BUN: 27 mg/dL — ABNORMAL HIGH (ref 6–20)
CALCIUM: 8.5 mg/dL — AB (ref 8.9–10.3)
CO2: 26 mmol/L (ref 22–32)
CREATININE: 1.16 mg/dL (ref 0.61–1.24)
Chloride: 103 mmol/L (ref 101–111)
GLUCOSE: 154 mg/dL — AB (ref 65–99)
Potassium: 3.6 mmol/L (ref 3.5–5.1)
Sodium: 139 mmol/L (ref 135–145)

## 2015-01-16 LAB — URINE CULTURE: SPECIAL REQUESTS: NORMAL

## 2015-01-16 LAB — HEMOGLOBIN A1C
HEMOGLOBIN A1C: 6.1 % — AB (ref 4.8–5.6)
MEAN PLASMA GLUCOSE: 128 mg/dL

## 2015-01-16 MED ORDER — FUROSEMIDE 10 MG/ML IJ SOLN
40.0000 mg | Freq: Once | INTRAMUSCULAR | Status: AC
  Administered 2015-01-16: 40 mg via INTRAVENOUS
  Filled 2015-01-16: qty 4

## 2015-01-16 MED ORDER — FUROSEMIDE 10 MG/ML IJ SOLN: 40.0000 mg | Freq: Two times a day (BID) | INTRAMUSCULAR | Status: DC

## 2015-01-16 MED ORDER — LEVALBUTEROL HCL 0.63 MG/3ML IN NEBU
0.6300 mg | INHALATION_SOLUTION | Freq: Four times a day (QID) | RESPIRATORY_TRACT | Status: DC | PRN
  Administered 2015-01-16: 0.63 mg via RESPIRATORY_TRACT
  Filled 2015-01-16: qty 3

## 2015-01-16 MED ORDER — FUROSEMIDE 10 MG/ML IJ SOLN
40.0000 mg | Freq: Two times a day (BID) | INTRAMUSCULAR | Status: DC
  Filled 2015-01-16: qty 4

## 2015-01-16 MED ORDER — INFLUENZA VAC SPLIT QUAD 0.5 ML IM SUSY
0.5000 mL | PREFILLED_SYRINGE | INTRAMUSCULAR | Status: DC
  Filled 2015-01-16: qty 0.5

## 2015-01-16 MED ORDER — PNEUMOCOCCAL VAC POLYVALENT 25 MCG/0.5ML IJ INJ
0.5000 mL | INJECTION | INTRAMUSCULAR | Status: DC
  Filled 2015-01-16: qty 0.5

## 2015-01-16 MED ORDER — FUROSEMIDE 10 MG/ML IJ SOLN
40.0000 mg | Freq: Two times a day (BID) | INTRAMUSCULAR | Status: DC
  Administered 2015-01-16 – 2015-01-17 (×3): 40 mg via INTRAVENOUS
  Filled 2015-01-16 (×2): qty 4

## 2015-01-16 MED ORDER — SODIUM CHLORIDE 0.9 % IV SOLN
1.0000 g | INTRAVENOUS | Status: DC
  Administered 2015-01-16 – 2015-01-17 (×2): 1 g via INTRAVENOUS
  Filled 2015-01-16 (×3): qty 1

## 2015-01-16 NOTE — Progress Notes (Signed)
  Echocardiogram 2D Echocardiogram has been performed.  Darlina Sicilian M 01/16/2015, 2:59 PM

## 2015-01-16 NOTE — Progress Notes (Signed)
Physician notified: Audery Amel At: [id number]  Regarding: afib HR sustained 140-150s, RR 35, 5LPM. Lasix IVP isn't scheduled until 1800. Received lasix this AM, cardizem gtt maxed at 15mg /hr, IVP metoprolol 10mg  due at noon.  Awaiting return response.   Returned Response at: 1016  Order(s): Cardiology consulted. Give dose lasix now then scheduled for BID.

## 2015-01-16 NOTE — Progress Notes (Signed)
I spoke Dr. Liberty Handy, patient has a contrast allergy and also has a perforated bowel.  I advised him we would be unable to give contrast.  He stated that was fine just do without any oral or IV.

## 2015-01-16 NOTE — Progress Notes (Signed)
@  0243 Paged Dr. Posey Pronto of IMTS regarding pt's HR (Afib sustained 120s-160s), RR 30s-40s on 5L (RA on admission) and questionable fluid overload. Both Cardizem gtt and IV Lopressor. Questioned if Cardiology should be consulted, Amio gtt should be ordered, Albuterol should be changed to Xopenex, and if Lasix should be restarted. IV Lasix ordered and given @approx  0400. Will convey to Day RN and continue to monitor.

## 2015-01-16 NOTE — Consult Note (Signed)
ELECTROPHYSIOLOGY CONSULT NOTE    Patient ID: William Fischer MRN: [id number], DOB/AGE: [birth year] 49 y.o.  Admit date: 01/14/2015 Date of Consult: 01/16/2015  Electrophysiologist: Delmer Kowalski Requesting Physician: Evette Doffing  Reason for Consultation: AF with RVR  HPI:  William Fischer is a 49 y.o. male with a past medical history significant for longstanding persistent atrial fibrillation, previous tachycardia induced cardiomyopathy, depression, and obesity.  He has been followed by Dr Rayann Heman for EP care and Truitt Merle for cardiology.   He failed TEE/DCCV in 11/2011.  He was then placed on Sotalol and repeat DCCV was also unsuccessful.  With obesity and severe LA enlargement, he was not felt to be a candidate for PVI and rate control was recommended.  He has done relatively well from an AF standpoint but has declined OSA evaluation in the past and also has not taken ACE-I as recommended.   He presented to the hospital with abdominal and groin pain that had been progressively worsening for 5 days prior to admission.  He was found to have perforated sigmoid diverticulutis.  He was seen by surgery who recommended conservative treatment.  His WBC continues to climb since admission.  He is afebrile but is tachypneic and requiring increased O2 to maintain saturations. Creat was elevated on admission at 1.95 but resolved with hydration. Repeat CT scan has been recommended today, but nursing is concerned about the patient's ability to maintain respiratory status while lying flat.   Last echo 03/2013 demonstrated EF 55-60%, mild MR, LA 56.  He currently denies chest pain and states that his abdominal pain is improved.   Past Medical History  Diagnosis Date  . Chicken pox as a child  . Migraine 06/29/2011  . Obesity 06/29/2011  . Depression with anxiety 06/29/2011  . Atrial fibrillation ([place])     admx 11/13 with acute sCHF in setting of RVR  => a. failed DCCV x 2; b. Pradaxa started;  c. failed  sotalol  . Chronic systolic heart failure ([place])     a. echo 11/13: Ef 40-45%, diff HK, mod MR, mod LAE, mild RVE, mod RAE, small effusion;   b. TEE 11/13:  EF 35-40%, no LAA clot; Echo 2/14 shows normal EF  . Cardiomyopathy ([place])     likely tachy mediated in setting of AF with RVR  . Snoring     patient needs sleep study - has declined  . Allergy to IVP dye   . Chronic anticoagulation     Pradaxa     Surgical History:  Past Surgical History  Procedure Laterality Date  . Tee without cardioversion  11/30/2011    Procedure: TRANSESOPHAGEAL ECHOCARDIOGRAM (TEE);  Surgeon: Thayer Headings, MD;  Location: [place] [facility];  Service: Cardiovascular;  Laterality: N/A;  . Cardioversion  11/30/2011    Procedure: CARDIOVERSION;  Surgeon: Thayer Headings, MD;  Location: [place];  Service: Cardiovascular;  Laterality: N/A;  . Cardioversion  12/03/2011    Procedure: CARDIOVERSION;  Surgeon: Jolaine Artist, MD;  Location: [facility] OR;  Service: Cardiovascular;  Laterality: N/A;     Prescriptions prior to admission  Medication Sig Dispense Refill Last Dose  . metoprolol (LOPRESSOR) 50 MG tablet TAKE 1 & 1/2 TABLETS (75 MG TOTAL) BY MOUTH 2 (TWO) TIMES DAILY. 270 tablet 1 01/14/2015 at 7a  . PRADAXA 150 MG CAPS capsule TAKE 1 CAPSULE BY MOUTH EVERY 12 HOURS 60 capsule 10 01/14/2015 at 7a  . spironolactone (ALDACTONE) 25 MG tablet TAKE 1 TABLET BY MOUTH EVERY  DAY 30 tablet 10 01/13/2015 at Unknown time  . furosemide (LASIX) 20 MG tablet TAKE 1 TABLET BY MOUTH EVERY DAY 30 tablet 10 01/11/2015    Inpatient Medications:  . ertapenem  1 g Intravenous Q24H  . furosemide  40 mg Intravenous BID  . heparin  5,000 Units Subcutaneous 3 times per day  . [START ON 01/17/2015] Influenza vac split quadrivalent PF  0.5 mL Intramuscular Tomorrow-1000  . [START ON 01/17/2015] Influenza vac split quadrivalent PF  0.5 mL Intramuscular Tomorrow-1000  . metoprolol  10 mg Intravenous 4 times per day  . [START ON  01/17/2015] pneumococcal 23 valent vaccine  0.5 mL Intramuscular Tomorrow-1000   . diltiazem (CARDIZEM) infusion 15 mg/hr (01/16/15 1100)     Allergies:  Allergies  Allergen Reactions  . Contrast Media [Iodinated Diagnostic Agents]     a diffuse macular rash after CTA chest     Social History   Social History  . Marital Status: Divorced    Spouse Name: N/A  . Number of Children: N/A  . Years of Education: N/A   Occupational History  . Not on file.   Social History Main Topics  . Smoking status: Current Every Day Smoker -- 1.00 packs/day for 20 years    Types: Cigarettes  . Smokeless tobacco: Never Used  . Alcohol Use: No  . Drug Use: No  . Sexual Activity: No   Other Topics Concern  . Not on file   Social History Narrative     Family History  Problem Relation Age of Onset  . Hypertension Mother   . Hypertension Father   . Aneurysm Father   . Alcohol abuse Father   . Cancer Maternal Grandmother     brain  . Diabetes Maternal Grandfather     type 2  . Parkinsonism Maternal Grandfather   . Cancer Paternal Grandmother     breast  . Diabetes Paternal Grandmother   . Alcohol abuse Paternal Grandfather      Review of Systems: All other systems reviewed and are otherwise negative except as noted above.  Physical Exam: Filed Vitals:   01/16/15 0754 01/16/15 0800 01/16/15 1114 01/16/15 1208  BP:  114/84    Pulse:  130    Temp: 97.5 F (36.4 C)   98.3 F (36.8 C)  TempSrc: Oral   Oral  Resp:  36    Height:      Weight:      SpO2:  91% 92%     GEN- The patient is obese and ill appearing, alert and oriented x 3 today.   HEENT: normocephalic, atraumatic; sclera clear, conjunctiva pink; hearing intact; oropharynx clear; neck supple  Lungs- Clear to ausculation bilaterally, normal work of breathing.  No wheezes, rales, rhonchi Heart- Tachycardic irregular rate and rhythm  GI- obese, non-tender, non-distended, bowel sounds present  Extremities- no  clubbing, cyanosis, or edema  MS- no significant deformity or atrophy Skin- warm and dry, no rash or lesion Psych- euthymic mood, full affect Neuro- strength and sensation are intact  Labs:   Lab Results  Component Value Date   WBC 19.8* 01/16/2015   HGB 15.9 01/16/2015   HCT 45.3 01/16/2015   MCV 92.1 01/16/2015   PLT 240 01/16/2015    Recent Labs Lab 01/14/15 0906  01/16/15 0404  NA 133*  < > 139  K 3.5  < > 3.6  CL 100*  < > 103  CO2 20*  < > 26  BUN 33*  < >  27*  CREATININE 1.95*  < > 1.16  CALCIUM 8.9  < > 8.5*  PROT 6.7  --   --   BILITOT 1.2  --   --   ALKPHOS 85  --   --   ALT 21  --   --   AST 22  --   --   GLUCOSE 185*  < > 154*  < > = values in this interval not displayed.    Radiology/Studies: Dg Chest Port 1 View 01/15/2015  CLINICAL DATA:  Hypoxia. EXAM: PORTABLE CHEST 1 VIEW COMPARISON:  November 30, 2011. FINDINGS: Stable cardiomegaly is noted. Central pulmonary vascular congestion is noted with mild bilateral perihilar and basilar interstitial densities concerning for possible edema. No pneumothorax is noted. Probable mild left pleural effusion is noted. Bony thorax is unremarkable. IMPRESSION: Cardiomegaly and central pulmonary vascular congestion is noted with probable mild bilateral perihilar and basilar pulmonary edema. Probable mild left pleural effusion is noted. Electronically Signed   By: Marijo Conception, M.D.   On: 01/15/2015 10:16   Ct Renal Stone Study 01/14/2015  CLINICAL DATA:  Bilateral groin pain for 5 days.  Hematuria. EXAM: CT ABDOMEN AND PELVIS WITHOUT CONTRAST TECHNIQUE: Multidetector CT imaging of the abdomen and pelvis was performed following the standard protocol without IV contrast. COMPARISON:  None. FINDINGS: Small left pleural effusion. Left lower lobe atelectasis or infiltrate. Cannot exclude pneumonia. Right lung bases clear. Heart is upper limits normal in size. Liver, spleen, pancreas, adrenals and kidneys have an unremarkable  unenhanced appearance. Gallbladder unremarkable. There is extensive pelvic and left retroperitoneal gas. Inflammatory changes in the left retroperitoneum and surrounding the descending colon and sigmoid colon. The gas appears to communicate with a diverticulum in the proximal sigmoid colon there is an area of wall thickening. Findings most compatible with perforated diverticulitis. Trace free fluid in the cul-de-sac. Stomach and small bowel and appendix are normal. Small umbilical hernia containing fat. Small bilateral inguinal hernias containing fat. No acute bony abnormality. IMPRESSION: Extensive gas throughout the left retroperitoneum with extensive stranding around the descending colon and sigmoid colon. This appears to be related to perforated diverticulitis in the proximal sigmoid colon. Small left pleural effusion. Left lower lobe atelectasis or infiltrate. Cannot exclude pneumonia. Electronically Signed   By: Rolm Baptise M.D.   On: 01/14/2015 12:10    EKG: atrial fibrillation, ventricular rate 169  TELEMETRY: atrial fibrillation with RVR, ventricular rates 100-140's  Assessment/Plan: 1.  Longstanding persistent atrial fibrillation The patient has longstanding persistent atrial fibrillation.  He has not been very successful with lifestyle modification in the past and has declined recommendions including ACE-I and sleep study.   His acute illness is most likely responsible for exacerbation of RVR Continue Dilt drip and metoprolol for now I think that we have to expect a certain amount of tachycardia during his acute illness.   2.  Tachypnea/hypoxia Likely multifactorial Will update echo - he has previously had tachycardia induced cardiomyopathy IV Lasix 40mg  bid for now - will need to follow renal function closely   Signed, Chanetta Marshall, NP 01/16/2015 12:53 PM  I have seen, examined the patient, and reviewed the above assessment and plan.  He is well known to me and has permanent  afib for which we have been treating with rate control.  He did have a tachycardia mediated CM previously which improved with rate control. He now presents with acute abdominal pain and perforated diverticula.  Changes to above are made where necessary.  Appears quite ill.  Will attempt diuresis given SOB.  Plan for AF long term is rate control.  I would continue current medicines until taking POs better.  Would anticipate with medical illness some degree of tachycardia.  Resumption of anticoagulation to be determined by surgical team.  EP to see as needed General cardiology to manage while here.  Co Sign: Thompson Grayer, MD 01/16/2015 1:22 PM

## 2015-01-16 NOTE — Progress Notes (Signed)
Subjective:  William Fischer says that he is having a more difficult time breathing. He is very distressed by not being able to drink whenever he wants, which I said was very understandable. Otherwise, he had no abdominal complaints, and said that it was not worsening.   Objective: Vital signs in last 24 hours: Filed Vitals:   01/16/15 0600 01/16/15 0754 01/16/15 0800 01/16/15 1114  BP: 104/78  114/84   Pulse: 109  130   Temp:  97.5 F (36.4 C)    TempSrc:  Oral    Resp: 37  36   Height:      Weight:      SpO2: 90%  91% 92%   Weight change:   Intake/Output Summary (Last 24 hours) at 01/16/15 1139 Last data filed at 01/16/15 1000  Gross per 24 hour  Intake  787.5 ml  Output   2650 ml  Net -1862.5 ml   Physical Exam General: Lying bed, mildy diaphoretic. HEENT: Dry mucous membranes, no scleral icterus Cardiovascular: Irregularly irregular rhythm, tachycardic. No murmurs, rubs, or gallops appreciated Pulmonary: Tachypnic. Scattered expiratory wheezes. Bibasilar crackles.  Abdominal: Distended, diffusely tender to palpation. Exquisitely tender to palpation in left flank. Bowel sounds normal Extremities: No clubbing, cyanosis. Trace edema in lower extremities. Psychiatric: Appropriate behavior and affect.  Lab Results: Basic Metabolic Panel:  Recent Labs Lab 01/15/15 0231 01/16/15 0404  NA 137 139  K 3.6 3.6  CL 103 103  CO2 21* 26  GLUCOSE 141* 154*  BUN 29* 27*  CREATININE 1.44* 1.16  CALCIUM 8.2* 8.5*   Liver Function Tests:  Recent Labs Lab 01/14/15 0906  AST 22  ALT 21  ALKPHOS 85  BILITOT 1.2  PROT 6.7  ALBUMIN 2.6*    Recent Labs Lab 01/14/15 0906  LIPASE 18   CBC:  Recent Labs Lab 01/15/15 0231 01/16/15 0404  WBC 18.3* 19.8*  HGB 15.7 15.9  HCT 45.6 45.3  MCV 92.5 92.1  PLT 221 240   Urinalysis:  Recent Labs Lab 01/14/15 1133  COLORURINE ORANGE*  LABSPEC 1.024  PHURINE 5.0  GLUCOSEU NEGATIVE  HGBUR SMALL*  BILIRUBINUR  MODERATE*  KETONESUR 15*  PROTEINUR 100*  NITRITE POSITIVE*  LEUKOCYTESUR SMALL*    Studies/Results: Dg Chest Port 1 View  01/15/2015  CLINICAL DATA:  Hypoxia. EXAM: PORTABLE CHEST 1 VIEW COMPARISON:  November 30, 2011. FINDINGS: Stable cardiomegaly is noted. Central pulmonary vascular congestion is noted with mild bilateral perihilar and basilar interstitial densities concerning for possible edema. No pneumothorax is noted. Probable mild left pleural effusion is noted. Bony thorax is unremarkable. IMPRESSION: Cardiomegaly and central pulmonary vascular congestion is noted with probable mild bilateral perihilar and basilar pulmonary edema. Probable mild left pleural effusion is noted. Electronically Signed   By: Marijo Conception, M.D.   On: 01/15/2015 10:16   Ct Renal Stone Study  01/14/2015  CLINICAL DATA:  Bilateral groin pain for 5 days.  Hematuria. EXAM: CT ABDOMEN AND PELVIS WITHOUT CONTRAST TECHNIQUE: Multidetector CT imaging of the abdomen and pelvis was performed following the standard protocol without IV contrast. COMPARISON:  None. FINDINGS: Small left pleural effusion. Left lower lobe atelectasis or infiltrate. Cannot exclude pneumonia. Right lung bases clear. Heart is upper limits normal in size. Liver, spleen, pancreas, adrenals and kidneys have an unremarkable unenhanced appearance. Gallbladder unremarkable. There is extensive pelvic and left retroperitoneal gas. Inflammatory changes in the left retroperitoneum and surrounding the descending colon and sigmoid colon. The gas appears to communicate with a  diverticulum in the proximal sigmoid colon there is an area of wall thickening. Findings most compatible with perforated diverticulitis. Trace free fluid in the cul-de-sac. Stomach and small bowel and appendix are normal. Small umbilical hernia containing fat. Small bilateral inguinal hernias containing fat. No acute bony abnormality. IMPRESSION: Extensive gas throughout the left  retroperitoneum with extensive stranding around the descending colon and sigmoid colon. This appears to be related to perforated diverticulitis in the proximal sigmoid colon. Small left pleural effusion. Left lower lobe atelectasis or infiltrate. Cannot exclude pneumonia. Electronically Signed   By: Rolm Baptise M.D.   On: 01/14/2015 12:10   Medications: I have reviewed the patient's current medications. Scheduled Meds: . ertapenem  1 g Intravenous Q24H  . furosemide  40 mg Intravenous BID  . heparin  5,000 Units Subcutaneous 3 times per day  . [START ON 01/17/2015] Influenza vac split quadrivalent PF  0.5 mL Intramuscular Tomorrow-1000  . [START ON 01/17/2015] Influenza vac split quadrivalent PF  0.5 mL Intramuscular Tomorrow-1000  . metoprolol  10 mg Intravenous 4 times per day  . [START ON 01/17/2015] pneumococcal 23 valent vaccine  0.5 mL Intramuscular Tomorrow-1000   Continuous Infusions: . diltiazem (CARDIZEM) infusion 15 mg/hr (01/16/15 0900)   PRN Meds:.levalbuterol, LORazepam, morphine injection, ondansetron **OR** ondansetron (ZOFRAN) IV Assessment/Plan:  Perforated sigmoid diverticulitis: Surgery following and does not recommend urgent surgery at this time, with hopes that antibiotics alone will be sufficient. While his abdominal exam is stable, his respiratory status and atrial fibrillation with RVR have been worsening, which could be driven by progressing perforation and inflammatory response, therefore, we will repeat CT imaging today. There is a low threshold to consult surgery if his condition deteriorates or has peritoneal signs.  - Zosyn transition to Ertapenem per surgery recommendations - Morphine 1 mg q3h prn - Zofran prn - Clear liquid per surgery recommendations - CT abdomen pending. Will given without contrast given allergy, and without contrast is generally sufficient in detecting abscesses based on ACR appropriateness criteria  Pulmonary Edema and Pleural Effusion:  Identified on CXR, and persistent crackles on exam. Oxygen requirements are increasing and is currently satting ~90% on 5L. He is at risk for ARDS, and low threshold to consult PCCM if respiratory status rapidly declines. Afib with RVR could be a contributing factor. We have stopped fluids. He received one dose of IV lasix last night. - Furosemide 40 mg BID - Incentive spirometry - Xopenex prn  Atrial Fibrillation with RVR: Chads-Vasc 2 on pradaxa, holding in case of urgent surgery. Rates have been in the 130s. On metoprolol 75 mg BID at home. - Diltiazem drip - Metoprolol 10 mg q6h - Cardiology consulted - Hold pradaxa   Hyperglycemia: Fasting CBG to 185 - Hgb A1c 6.1, it was 6.2 last year  UTI: Ertapenem.  AKI: Baseline Cr around 1. 1.95 in setting of poor PO intake on admission. Likely prerenal, and Cr improved to 1,16 after IVF. - Stopped IVF given pulmonary edema  DVT prophylaxis: Heparin [place] Dispo: Disposition is deferred at this time, awaiting improvement of current medical problems.  Anticipated discharge in approximately 3-4 day(s).   The patient does have a current PCP Thompson Grayer, MD) and does not need an [facility] hospital follow-up appointment after discharge.  The patient does have transportation limitations that hinder transportation to clinic appointments.  .Services Needed at time of discharge: Y = Yes, Blank = No PT:   OT:   RN:   Equipment:   Other:  LOS: 2 days   Liberty Handy, MD 01/16/2015, 11:39 AM

## 2015-01-16 NOTE — Progress Notes (Signed)
Patient ID: William Fischer, male   DOB: [birth year], 49 y.o.   MRN: [id number]     [address] [place], [place] [address]    Phone: [phone] FAX: [phone]     Subjective: Afebrile.  WBC increased but pain is resolved. Remains tachycardic and tachypnic.    Objective:  Vital signs:  Filed Vitals:   01/16/15 0400 01/16/15 0525 01/16/15 0600 01/16/15 0754  BP: 126/78 118/82 104/78   Pulse: 102 108 109   Temp: 98.2 F (36.8 C)   97.5 F (36.4 C)  TempSrc: Oral   Oral  Resp: 33 34 37   Height:      Weight:      SpO2: 90% 91% 90%        Intake/Output   Yesterday:  12/29 0701 - 12/30 0700 In: 952.5 [I.V.:802.5; IV Piggyback:150] Out: 2175 [Urine:2175] This shift:     Physical Exam: General: Pt awake/alert/oriented x4 in mild acute distress Abdomen: +Bs, abdomen is soft and non tender.    Problem List:   Active Problems:   Diverticulitis of colon with perforation    Results:   Labs: Results for orders placed or performed during the hospital encounter of 01/14/15 (from the past 48 hour(s))  Lipase, blood     Status: None   Collection Time: 01/14/15  9:06 AM  Result Value Ref Range   Lipase 18 11 - 51 U/L  Comprehensive metabolic panel     Status: Abnormal   Collection Time: 01/14/15  9:06 AM  Result Value Ref Range   Sodium 133 (L) 135 - 145 mmol/L   Potassium 3.5 3.5 - 5.1 mmol/L   Chloride 100 (L) 101 - 111 mmol/L   CO2 20 (L) 22 - 32 mmol/L   Glucose, Bld 185 (H) 65 - 99 mg/dL   BUN 33 (H) 6 - 20 mg/dL   Creatinine, Ser 1.95 (H) 0.61 - 1.24 mg/dL   Calcium 8.9 8.9 - 10.3 mg/dL   Total Protein 6.7 6.5 - 8.1 g/dL   Albumin 2.6 (L) 3.5 - 5.0 g/dL   AST 22 15 - 41 U/L   ALT 21 17 - 63 U/L   Alkaline Phosphatase 85 38 - 126 U/L   Total Bilirubin 1.2 0.3 - 1.2 mg/dL   GFR calc non Af Amer 39 (L) >60 mL/min   GFR calc Af Amer 45 (L) >60 mL/min    Comment: (NOTE) The eGFR has been  calculated using the CKD EPI equation. This calculation has not been validated in all clinical situations. eGFR's persistently <60 mL/min signify possible Chronic Kidney Disease.    Anion gap 13 5 - 15  CBC     Status: Abnormal   Collection Time: 01/14/15  9:06 AM  Result Value Ref Range   WBC 11.9 (H) 4.0 - 10.5 K/uL   RBC 4.97 4.22 - 5.81 MIL/uL   Hemoglobin 15.9 13.0 - 17.0 g/dL   HCT 45.3 39.0 - 52.0 %   MCV 91.1 78.0 - 100.0 fL   MCH 32.0 26.0 - 34.0 pg   MCHC 35.1 30.0 - 36.0 g/dL   RDW 14.0 11.5 - 15.5 %   Platelets 214 150 - 400 K/uL  I-stat troponin, ED     Status: None   Collection Time: 01/14/15  9:45 AM  Result Value Ref Range   Troponin i, poc 0.01 0.00 - 0.08 ng/mL   Comment 3  Comment: Due to the release kinetics of cTnI, a negative result within the first hours of the onset of symptoms does not rule out myocardial infarction with certainty. If myocardial infarction is still suspected, repeat the test at appropriate intervals.   Urinalysis, Routine w reflex microscopic (not at [facility])     Status: Abnormal   Collection Time: 01/14/15 11:33 AM  Result Value Ref Range   Color, Urine ORANGE (A) YELLOW    Comment: BIOCHEMICALS MAY BE AFFECTED BY COLOR   APPearance TURBID (A) CLEAR   Specific Gravity, Urine 1.024 1.005 - 1.030   pH 5.0 5.0 - 8.0   Glucose, UA NEGATIVE NEGATIVE mg/dL   Hgb urine dipstick SMALL (A) NEGATIVE   Bilirubin Urine MODERATE (A) NEGATIVE   Ketones, ur 15 (A) NEGATIVE mg/dL   Protein, ur 100 (A) NEGATIVE mg/dL   Nitrite POSITIVE (A) NEGATIVE   Leukocytes, UA SMALL (A) NEGATIVE  Urine microscopic-add on     Status: Abnormal   Collection Time: 01/14/15 11:33 AM  Result Value Ref Range   Squamous Epithelial / LPF 0-5 (A) NONE SEEN   WBC, UA 6-30 0 - 5 WBC/hpf   RBC / HPF 0-5 0 - 5 RBC/hpf   Bacteria, UA MANY (A) NONE SEEN   Casts HYALINE CASTS (A) NEGATIVE    Comment: GRANULAR CAST   Urine-Other AMORPHOUS URATES/PHOSPHATES    I-Stat CG4 Lactic Acid, ED     Status: None   Collection Time: 01/14/15 12:37 PM  Result Value Ref Range   Lactic Acid, Venous 1.39 0.5 - 2.0 mmol/L  MRSA PCR Screening     Status: None   Collection Time: 01/14/15  6:05 PM  Result Value Ref Range   MRSA by PCR NEGATIVE NEGATIVE    Comment:        The GeneXpert MRSA Assay (FDA approved for NASAL specimens only), is one component of a comprehensive MRSA colonization surveillance program. It is not intended to diagnose MRSA infection nor to guide or monitor treatment for MRSA infections.   Basic metabolic panel     Status: Abnormal   Collection Time: 01/15/15  2:31 AM  Result Value Ref Range   Sodium 137 135 - 145 mmol/L   Potassium 3.6 3.5 - 5.1 mmol/L   Chloride 103 101 - 111 mmol/L   CO2 21 (L) 22 - 32 mmol/L   Glucose, Bld 141 (H) 65 - 99 mg/dL   BUN 29 (H) 6 - 20 mg/dL   Creatinine, Ser 1.44 (H) 0.61 - 1.24 mg/dL   Calcium 8.2 (L) 8.9 - 10.3 mg/dL   GFR calc non Af Amer 57 (L) >60 mL/min   GFR calc Af Amer >60 >60 mL/min    Comment: (NOTE) The eGFR has been calculated using the CKD EPI equation. This calculation has not been validated in all clinical situations. eGFR's persistently <60 mL/min signify possible Chronic Kidney Disease.    Anion gap 13 5 - 15  CBC     Status: Abnormal   Collection Time: 01/15/15  2:31 AM  Result Value Ref Range   WBC 18.3 (H) 4.0 - 10.5 K/uL   RBC 4.93 4.22 - 5.81 MIL/uL   Hemoglobin 15.7 13.0 - 17.0 g/dL   HCT 45.6 39.0 - 52.0 %   MCV 92.5 78.0 - 100.0 fL   MCH 31.8 26.0 - 34.0 pg   MCHC 34.4 30.0 - 36.0 g/dL   RDW 14.5 11.5 - 15.5 %   Platelets 221 150 - 400  K/uL  Hemoglobin A1c     Status: Abnormal   Collection Time: 01/15/15  2:31 AM  Result Value Ref Range   Hgb A1c MFr Bld 6.1 (H) 4.8 - 5.6 %    Comment: (NOTE)         Pre-diabetes: 5.7 - 6.4         Diabetes: >6.4         Glycemic control for adults with diabetes: <7.0    Mean Plasma Glucose 128 mg/dL    Comment:  (NOTE) Performed At: [facility] [place], [place] [id number] Lindon Romp MD JM:[id number]   CBC     Status: Abnormal   Collection Time: 01/16/15  4:04 AM  Result Value Ref Range   WBC 19.8 (H) 4.0 - 10.5 K/uL   RBC 4.92 4.22 - 5.81 MIL/uL   Hemoglobin 15.9 13.0 - 17.0 g/dL   HCT 45.3 39.0 - 52.0 %   MCV 92.1 78.0 - 100.0 fL   MCH 32.3 26.0 - 34.0 pg   MCHC 35.1 30.0 - 36.0 g/dL   RDW 15.0 11.5 - 15.5 %   Platelets 240 150 - 400 K/uL  Basic metabolic panel     Status: Abnormal   Collection Time: 01/16/15  4:04 AM  Result Value Ref Range   Sodium 139 135 - 145 mmol/L   Potassium 3.6 3.5 - 5.1 mmol/L   Chloride 103 101 - 111 mmol/L   CO2 26 22 - 32 mmol/L   Glucose, Bld 154 (H) 65 - 99 mg/dL   BUN 27 (H) 6 - 20 mg/dL   Creatinine, Ser 1.16 0.61 - 1.24 mg/dL   Calcium 8.5 (L) 8.9 - 10.3 mg/dL   GFR calc non Af Amer >60 >60 mL/min   GFR calc Af Amer >60 >60 mL/min    Comment: (NOTE) The eGFR has been calculated using the CKD EPI equation. This calculation has not been validated in all clinical situations. eGFR's persistently <60 mL/min signify possible Chronic Kidney Disease.    Anion gap 10 5 - 15    Imaging / Studies: Dg Chest Port 1 View  01/15/2015  CLINICAL DATA:  Hypoxia. EXAM: PORTABLE CHEST 1 VIEW COMPARISON:  November 30, 2011. FINDINGS: Stable cardiomegaly is noted. Central pulmonary vascular congestion is noted with mild bilateral perihilar and basilar interstitial densities concerning for possible edema. No pneumothorax is noted. Probable mild left pleural effusion is noted. Bony thorax is unremarkable. IMPRESSION: Cardiomegaly and central pulmonary vascular congestion is noted with probable mild bilateral perihilar and basilar pulmonary edema. Probable mild left pleural effusion is noted. Electronically Signed   By: Marijo Conception, M.D.   On: 01/15/2015 10:16   Ct Renal Stone Study  01/14/2015  CLINICAL DATA:  Bilateral groin pain  for 5 days.  Hematuria. EXAM: CT ABDOMEN AND PELVIS WITHOUT CONTRAST TECHNIQUE: Multidetector CT imaging of the abdomen and pelvis was performed following the standard protocol without IV contrast. COMPARISON:  None. FINDINGS: Small left pleural effusion. Left lower lobe atelectasis or infiltrate. Cannot exclude pneumonia. Right lung bases clear. Heart is upper limits normal in size. Liver, spleen, pancreas, adrenals and kidneys have an unremarkable unenhanced appearance. Gallbladder unremarkable. There is extensive pelvic and left retroperitoneal gas. Inflammatory changes in the left retroperitoneum and surrounding the descending colon and sigmoid colon. The gas appears to communicate with a diverticulum in the proximal sigmoid colon there is an area of wall thickening. Findings most compatible with perforated diverticulitis. Trace free fluid  in the cul-de-sac. Stomach and small bowel and appendix are normal. Small umbilical hernia containing fat. Small bilateral inguinal hernias containing fat. No acute bony abnormality. IMPRESSION: Extensive gas throughout the left retroperitoneum with extensive stranding around the descending colon and sigmoid colon. This appears to be related to perforated diverticulitis in the proximal sigmoid colon. Small left pleural effusion. Left lower lobe atelectasis or infiltrate. Cannot exclude pneumonia. Electronically Signed   By: Rolm Baptise M.D.   On: 01/14/2015 12:10    Medications / Allergies:  Scheduled Meds: . ertapenem  1 g Intravenous Q24H  . heparin  5,000 Units Subcutaneous 3 times per day  . [START ON 01/17/2015] Influenza vac split quadrivalent PF  0.5 mL Intramuscular Tomorrow-1000  . [START ON 01/17/2015] Influenza vac split quadrivalent PF  0.5 mL Intramuscular Tomorrow-1000  . metoprolol  10 mg Intravenous 4 times per day  . [START ON 01/17/2015] pneumococcal 23 valent vaccine  0.5 mL Intramuscular Tomorrow-1000   Continuous Infusions: . diltiazem  (CARDIZEM) infusion 15 mg/hr (01/16/15 0600)   PRN Meds:.albuterol, LORazepam, morphine injection, ondansetron **OR** ondansetron (ZOFRAN) IV  Antibiotics: Anti-infectives    Start     Dose/Rate Route Frequency Ordered Stop   01/16/15 0845  ertapenem (INVANZ) 1 g in sodium chloride 0.9 % 50 mL IVPB     1 g 100 mL/hr over 30 Minutes Intravenous Every 24 hours 01/16/15 0841     01/14/15 1245  piperacillin-tazobactam (ZOSYN) IVPB 3.375 g  Status:  Discontinued     3.375 g 12.5 mL/hr over 240 Minutes Intravenous 3 times per day 01/14/15 1238 01/16/15 0841   01/14/15 1230  ciprofloxacin (CIPRO) IVPB 400 mg  Status:  Discontinued     400 mg 200 mL/hr over 60 Minutes Intravenous  Once 01/14/15 1218 01/14/15 1238   01/14/15 1230  metroNIDAZOLE (FLAGYL) IVPB 500 mg  Status:  Discontinued     500 mg 100 mL/hr over 60 Minutes Intravenous  Once 01/14/15 1218 01/14/15 1238        Assessment/Plan Perforated sigmoid diverticulitis-non tender on exam. WBC increased, afebrile.  There are no indications for urgent surgery at present time. Follow closely. Should he clinically decline or develop peritoneal signs, then he would require a exploratory laparotomy/hartman's. change antibiotics.  Likely a repeat CT scan in a few days. Hypoxia-tachypnic, tachy, per primary team ID-zosyn D#[id number].  Start [employer] UTI-culture pending VTE prophylaxis-SCD/heparin Atrial fibrillation with RVR-cardizem gtt. hold pradaxa(last dose 0600 12/28).  Would recommend a cards consult.  FEN-keep NPO Dispo-continue SDU  Erby Pian, [facility] Surgery Pager [phone]) For consults and floor pages call [phone](7A-4:30P)  01/16/2015 8:45 AM

## 2015-01-17 ENCOUNTER — Encounter (HOSPITAL_COMMUNITY)
Admission: EM | Disposition: A | Discharge status: Home Health | Payer: Self-pay | Source: Home / Self Care | Attending: Internal Medicine

## 2015-01-17 ENCOUNTER — Inpatient Hospital Stay (HOSPITAL_COMMUNITY): Payer: Medicaid Other | Admitting: Anesthesiology

## 2015-01-17 ENCOUNTER — Inpatient Hospital Stay (HOSPITAL_COMMUNITY): Payer: Medicaid Other

## 2015-01-17 DIAGNOSIS — K5720 Diverticulitis of large intestine with perforation and abscess without bleeding: Secondary | ICD-10-CM

## 2015-01-17 DIAGNOSIS — A419 Sepsis, unspecified organism: Secondary | ICD-10-CM

## 2015-01-17 HISTORY — PX: LAPAROTOMY: SHX154

## 2015-01-17 LAB — BASIC METABOLIC PANEL
ANION GAP: 11 (ref 5–15)
Anion gap: 12 (ref 5–15)
BUN: 27 mg/dL — AB (ref 6–20)
BUN: 30 mg/dL — ABNORMAL HIGH (ref 6–20)
CALCIUM: 8.1 mg/dL — AB (ref 8.9–10.3)
CHLORIDE: 100 mmol/L — AB (ref 101–111)
CO2: 27 mmol/L (ref 22–32)
CO2: 24 mmol/L (ref 22–32)
CREATININE: 1.09 mg/dL (ref 0.61–1.24)
CREATININE: 1.15 mg/dL (ref 0.61–1.24)
Calcium: 7.1 mg/dL — ABNORMAL LOW (ref 8.9–10.3)
Chloride: 95 mmol/L — ABNORMAL LOW (ref 101–111)
GFR calc non Af Amer: >60 mL/min (ref >60)
GFR calc non Af Amer: >60 mL/min (ref >60)
Glucose, Bld: 151 mg/dL — ABNORMAL HIGH (ref 65–99)
Glucose, Bld: 171 mg/dL — ABNORMAL HIGH (ref 65–99)
Potassium: 3.3 mmol/L — ABNORMAL LOW (ref 3.5–5.1)
Potassium: 3.6 mmol/L (ref 3.5–5.1)
SODIUM: 134 mmol/L — AB (ref 135–145)
SODIUM: 135 mmol/L (ref 135–145)

## 2015-01-17 LAB — POCT I-STAT 7, (LYTES, BLD GAS, ICA,H+H)
Acid-Base Excess: 1 mmol/L (ref 0.0–2.0)
Acid-Base Excess: 4 mmol/L — ABNORMAL HIGH (ref 0.0–2.0)
BICARBONATE: 30.2 meq/L — AB (ref 20.0–24.0)
Bicarbonate: 27.3 mEq/L — ABNORMAL HIGH (ref 20.0–24.0)
CALCIUM ION: 1.04 mmol/L — AB (ref 1.12–1.23)
CALCIUM ION: 1.08 mmol/L — AB (ref 1.12–1.23)
HCT: 48 % (ref 39.0–52.0)
HEMATOCRIT: 49 % (ref 39.0–52.0)
HEMOGLOBIN: 16.7 g/dL (ref 13.0–17.0)
Hemoglobin: 16.3 g/dL (ref 13.0–17.0)
O2 Saturation: 94 %
O2 Saturation: 98 %
PCO2 ART: 47.9 mmHg — AB (ref 35.0–45.0)
PCO2 ART: 50.7 mmHg — AB (ref 35.0–45.0)
PO2 ART: 113 mmHg — AB (ref 80.0–100.0)
POTASSIUM: 3.4 mmol/L — AB (ref 3.5–5.1)
Potassium: 3.5 mmol/L (ref 3.5–5.1)
SODIUM: 135 mmol/L (ref 135–145)
SODIUM: 135 mmol/L (ref 135–145)
TCO2: 32 mmol/L (ref 0–100)
TCO2: 29 mmol/L (ref 0–100)
pH, Arterial: 7.364 (ref 7.350–7.450)
pH, Arterial: 7.383 (ref 7.350–7.450)
pO2, Arterial: 73 mmHg — ABNORMAL LOW (ref 80.0–100.0)

## 2015-01-17 LAB — CBC
HCT: 46.4 % (ref 39.0–52.0)
HEMATOCRIT: 43.8 % (ref 39.0–52.0)
HEMOGLOBIN: 15.1 g/dL (ref 13.0–17.0)
Hemoglobin: 16.7 g/dL (ref 13.0–17.0)
MCH: 32.6 pg (ref 26.0–34.0)
MCH: 31.4 pg (ref 26.0–34.0)
MCHC: 36 g/dL (ref 30.0–36.0)
MCHC: 34.5 g/dL (ref 30.0–36.0)
MCV: 90.4 fL (ref 78.0–100.0)
MCV: 91.1 fL (ref 78.0–100.0)
Platelets: 229 10*3/uL (ref 150–400)
Platelets: 224 10*3/uL (ref 150–400)
RBC: 5.13 MIL/uL (ref 4.22–5.81)
RBC: 4.81 MIL/uL (ref 4.22–5.81)
RDW: 14.9 % (ref 11.5–15.5)
RDW: 15 % (ref 11.5–15.5)
WBC: 17.8 10*3/uL — ABNORMAL HIGH (ref 4.0–10.5)
WBC: 23.4 10*3/uL — AB (ref 4.0–10.5)

## 2015-01-17 LAB — POCT I-STAT 3, ART BLOOD GAS (G3+)
ACID-BASE EXCESS: 3 mmol/L — AB (ref 0.0–2.0)
BICARBONATE: 29.2 meq/L — AB (ref 20.0–24.0)
O2 SAT: 97 %
PCO2 ART: 48.8 mmHg — AB (ref 35.0–45.0)
PO2 ART: 90 mmHg (ref 80.0–100.0)
Patient temperature: 98.9
TCO2: 31 mmol/L (ref 0–100)
pH, Arterial: 7.386 (ref 7.350–7.450)

## 2015-01-17 LAB — TRIGLYCERIDES: TRIGLYCERIDES: 156 mg/dL — AB (ref <150)

## 2015-01-17 LAB — TYPE AND SCREEN
ABO/RH(D): O POS
Antibody Screen: NEGATIVE

## 2015-01-17 LAB — ABO/RH: ABO/RH(D): O POS

## 2015-01-17 LAB — GLUCOSE, CAPILLARY: GLUCOSE-CAPILLARY: 138 mg/dL — AB (ref 65–99)

## 2015-01-17 SURGERY — LAPAROTOMY, EXPLORATORY
Anesthesia: General

## 2015-01-17 MED ORDER — FENTANYL CITRATE (PF) 100 MCG/2ML IJ SOLN: 50.0000 ug | Freq: Once | INTRAMUSCULAR | Status: DC

## 2015-01-17 MED ORDER — LACTATED RINGERS IV BOLUS (SEPSIS)
1000.0000 mL | Freq: Once | INTRAVENOUS | Status: AC
  Administered 2015-01-17: 1000 mL via INTRAVENOUS

## 2015-01-17 MED ORDER — PHENYLEPHRINE HCL 10 MG/ML IJ SOLN
INTRAMUSCULAR | Status: DC | PRN
  Administered 2015-01-17: 120 ug via INTRAVENOUS

## 2015-01-17 MED ORDER — PHENYLEPHRINE HCL 10 MG/ML IJ SOLN
10.0000 mg | INTRAMUSCULAR | Status: DC | PRN
  Administered 2015-01-17: 60 ug/min via INTRAVENOUS

## 2015-01-17 MED ORDER — PROPOFOL 10 MG/ML IV BOLUS
INTRAVENOUS | Status: DC | PRN
  Administered 2015-01-17: 30 mg via INTRAVENOUS

## 2015-01-17 MED ORDER — LACTATED RINGERS IV SOLN
INTRAVENOUS | Status: DC | PRN
  Administered 2015-01-17: 16:00:00 via INTRAVENOUS

## 2015-01-17 MED ORDER — SODIUM CHLORIDE 0.9 % IV BOLUS (SEPSIS)
1000.0000 mL | Freq: Once | INTRAVENOUS | Status: AC
  Administered 2015-01-17: 1000 mL via INTRAVENOUS

## 2015-01-17 MED ORDER — SODIUM CHLORIDE 0.9 % IV SOLN
INTRAVENOUS | Status: DC | PRN
  Administered 2015-01-17: 16:00:00 via INTRAVENOUS

## 2015-01-17 MED ORDER — SUFENTANIL CITRATE 50 MCG/ML IV SOLN
INTRAVENOUS | Status: AC
  Filled 2015-01-17: qty 1

## 2015-01-17 MED ORDER — PROPOFOL 500 MG/50ML IV EMUL
INTRAVENOUS | Status: DC | PRN
  Administered 2015-01-17: 50 ug/kg/min via INTRAVENOUS

## 2015-01-17 MED ORDER — SUCCINYLCHOLINE CHLORIDE 20 MG/ML IJ SOLN
INTRAMUSCULAR | Status: DC | PRN
  Administered 2015-01-17: 120 mg via INTRAVENOUS

## 2015-01-17 MED ORDER — PROPOFOL 10 MG/ML IV BOLUS
INTRAVENOUS | Status: AC
  Filled 2015-01-17: qty 20

## 2015-01-17 MED ORDER — SUFENTANIL CITRATE 50 MCG/ML IV SOLN
INTRAVENOUS | Status: DC | PRN
  Administered 2015-01-17: 10 ug via INTRAVENOUS
  Administered 2015-01-17: 25 ug via INTRAVENOUS

## 2015-01-17 MED ORDER — BISACODYL 10 MG RE SUPP: 10.0000 mg | Freq: Every day | RECTAL | Status: DC | PRN

## 2015-01-17 MED ORDER — MIDAZOLAM HCL 2 MG/2ML IJ SOLN
INTRAMUSCULAR | Status: AC
  Filled 2015-01-17: qty 2

## 2015-01-17 MED ORDER — 0.9 % SODIUM CHLORIDE (POUR BTL) OPTIME
TOPICAL | Status: DC | PRN
  Administered 2015-01-17: 4000 mL

## 2015-01-17 MED ORDER — PROPOFOL 1000 MG/100ML IV EMUL
0.0000 ug/kg/min | INTRAVENOUS | Status: DC
  Administered 2015-01-17: 15 ug/kg/min via INTRAVENOUS
  Administered 2015-01-17: 20 ug/kg/min via INTRAVENOUS
  Filled 2015-01-17 (×2): qty 100

## 2015-01-17 MED ORDER — ALBUMIN HUMAN 5 % IV SOLN
INTRAVENOUS | Status: DC | PRN
  Administered 2015-01-17 (×2): via INTRAVENOUS

## 2015-01-17 MED ORDER — MIDAZOLAM HCL 2 MG/2ML IJ SOLN
1.0000 mg | INTRAMUSCULAR | Status: DC | PRN
  Administered 2015-01-18 (×3): 1 mg via INTRAVENOUS
  Filled 2015-01-17 (×3): qty 2

## 2015-01-17 MED ORDER — LIDOCAINE HCL (CARDIAC) 20 MG/ML IV SOLN
INTRAVENOUS | Status: DC | PRN
  Administered 2015-01-17: 80 mg via INTRATRACHEAL

## 2015-01-17 MED ORDER — MIDAZOLAM HCL 2 MG/2ML IJ SOLN
INTRAMUSCULAR | Status: DC | PRN
  Administered 2015-01-17: 2 mg via INTRAVENOUS
  Administered 2015-01-17: 4 mg via INTRAVENOUS

## 2015-01-17 MED ORDER — ROCURONIUM BROMIDE 100 MG/10ML IV SOLN
INTRAVENOUS | Status: DC | PRN
  Administered 2015-01-17 (×2): 50 mg via INTRAVENOUS

## 2015-01-17 MED ORDER — SODIUM CHLORIDE 0.9 % IV SOLN
INTRAVENOUS | Status: DC
  Administered 2015-01-17 – 2015-01-18 (×2): via INTRAVENOUS

## 2015-01-17 MED ORDER — DOCUSATE SODIUM 50 MG/5ML PO LIQD: 100.0000 mg | Freq: Two times a day (BID) | ORAL | Status: DC | PRN

## 2015-01-17 MED ORDER — DEXTROSE 5 % IV SOLN
30.0000 ug/min | INTRAVENOUS | Status: DC
  Administered 2015-01-17: 40 ug/min via INTRAVENOUS
  Administered 2015-01-17: 30 ug/min via INTRAVENOUS
  Administered 2015-01-18: 50 ug/min via INTRAVENOUS
  Filled 2015-01-17 (×4): qty 1

## 2015-01-17 MED ORDER — FENTANYL BOLUS VIA INFUSION
50.0000 ug | INTRAVENOUS | Status: DC | PRN
  Filled 2015-01-17: qty 50

## 2015-01-17 MED ORDER — SODIUM CHLORIDE 0.9 % IV SOLN
25.0000 ug/h | INTRAVENOUS | Status: DC
  Administered 2015-01-17 (×2): 50 ug/h via INTRAVENOUS
  Filled 2015-01-17 (×2): qty 50

## 2015-01-17 MED ORDER — ROCURONIUM BROMIDE 50 MG/5ML IV SOLN
INTRAVENOUS | Status: AC
  Filled 2015-01-17: qty 1

## 2015-01-17 MED ORDER — LORAZEPAM 2 MG/ML IJ SOLN
0.5000 mg | Freq: Four times a day (QID) | INTRAMUSCULAR | Status: DC | PRN
  Administered 2015-01-17: 0.5 mg via INTRAVENOUS
  Filled 2015-01-17: qty 1

## 2015-01-17 MED ORDER — MIDAZOLAM HCL 2 MG/2ML IJ SOLN
INTRAMUSCULAR | Status: AC
  Filled 2015-01-17: qty 4

## 2015-01-17 SURGICAL SUPPLY — 53 items
BLADE SURG ROTATE 9660 (MISCELLANEOUS) ×3 IMPLANT
CANISTER SUCTION 2500CC (MISCELLANEOUS) ×3 IMPLANT
CHLORAPREP W/TINT 26ML (MISCELLANEOUS) ×3 IMPLANT
COVER MAYO STAND STRL (DRAPES) IMPLANT
COVER SURGICAL LIGHT HANDLE (MISCELLANEOUS) ×3 IMPLANT
DRAPE LAPAROSCOPIC ABDOMINAL (DRAPES) ×3 IMPLANT
DRAPE PROXIMA HALF (DRAPES) IMPLANT
DRAPE UTILITY XL STRL (DRAPES) ×6 IMPLANT
DRAPE WARM FLUID 44X44 (DRAPE) ×3 IMPLANT
DRSG MEPILEX BORDER 4X4 (GAUZE/BANDAGES/DRESSINGS) ×2 IMPLANT
DRSG OPSITE POSTOP 4X10 (GAUZE/BANDAGES/DRESSINGS) IMPLANT
DRSG OPSITE POSTOP 4X8 (GAUZE/BANDAGES/DRESSINGS) IMPLANT
DRSG PAD ABDOMINAL 8X10 ST (GAUZE/BANDAGES/DRESSINGS) ×3 IMPLANT
ELECT BLADE 6.5 EXT (BLADE) ×2 IMPLANT
ELECT CAUTERY BLADE 6.4 (BLADE) ×3 IMPLANT
ELECT REM PT RETURN 9FT ADLT (ELECTROSURGICAL) ×3
ELECTRODE REM PT RTRN 9FT ADLT (ELECTROSURGICAL) ×1 IMPLANT
GLOVE BIO SURGEON STRL SZ7 (GLOVE) ×3 IMPLANT
GLOVE BIOGEL PI IND STRL 7.5 (GLOVE) ×1 IMPLANT
GLOVE BIOGEL PI INDICATOR 7.5 (GLOVE) ×2
GOWN STRL REUS W/ TWL LRG LVL3 (GOWN DISPOSABLE) ×3 IMPLANT
GOWN STRL REUS W/TWL LRG LVL3 (GOWN DISPOSABLE) ×9
KIT BASIN OR (CUSTOM PROCEDURE TRAY) ×3 IMPLANT
KIT OSTOMY DRAINABLE 2.75 STR (WOUND CARE) ×3 IMPLANT
KIT ROOM TURNOVER OR (KITS) ×3 IMPLANT
LIGASURE IMPACT 36 18CM CVD LR (INSTRUMENTS) ×3 IMPLANT
NS IRRIG 1000ML POUR BTL (IV SOLUTION) ×12 IMPLANT
PACK GENERAL/GYN (CUSTOM PROCEDURE TRAY) ×3 IMPLANT
PAD ARMBOARD 7.5X6 YLW CONV (MISCELLANEOUS) ×3 IMPLANT
PENCIL BUTTON HOLSTER BLD 10FT (ELECTRODE) IMPLANT
RELOAD PROXIMATE 75MM BLUE (ENDOMECHANICALS) ×3 IMPLANT
SPECIMEN JAR LARGE (MISCELLANEOUS) IMPLANT
SPONGE GAUZE 4X4 12PLY STER LF (GAUZE/BANDAGES/DRESSINGS) ×3 IMPLANT
SPONGE LAP 18X18 X RAY DECT (DISPOSABLE) IMPLANT
STAPLER CUT CVD 40MM GREEN (STAPLE) ×3 IMPLANT
STAPLER PROXIMATE 75MM BLUE (STAPLE) ×3 IMPLANT
STAPLER VISISTAT 35W (STAPLE) ×3 IMPLANT
SUCTION POOLE TIP (SUCTIONS) ×3 IMPLANT
SUT PDS AB 1 TP1 96 (SUTURE) ×6 IMPLANT
SUT SILK 2 0 (SUTURE) ×3
SUT SILK 2 0 SH CR/8 (SUTURE) ×3 IMPLANT
SUT SILK 2-0 18XBRD TIE 12 (SUTURE) ×1 IMPLANT
SUT SILK 3 0 (SUTURE) ×3
SUT SILK 3 0 SH CR/8 (SUTURE) ×3 IMPLANT
SUT SILK 3-0 18XBRD TIE 12 (SUTURE) ×1 IMPLANT
SUT VIC AB 3-0 SH 18 (SUTURE) ×2 IMPLANT
SUT VIC AB 3-0 SH 27 (SUTURE)
SUT VIC AB 3-0 SH 27X BRD (SUTURE) IMPLANT
TOWEL OR 17X26 10 PK STRL BLUE (TOWEL DISPOSABLE) ×3 IMPLANT
TRAY FOLEY CATH 16FRSI W/METER (SET/KITS/TRAYS/PACK) ×3 IMPLANT
TUBE CONNECTING 12'X1/4 (SUCTIONS)
TUBE CONNECTING 12X1/4 (SUCTIONS) IMPLANT
YANKAUER SUCT BULB TIP NO VENT (SUCTIONS) IMPLANT

## 2015-01-17 NOTE — Progress Notes (Signed)
  Subjective: No abd pain, having bowel function  Objective: Vital signs in last 24 hours: Temp:  [97.9 F (36.6 C)-100 F (37.8 C)] 98 F (36.7 C) (12/31 0251) Pulse Rate:  [100-134] 134 (12/31 0800) Resp:  [26-39] 34 (12/31 0800) BP: (102-124)/(69-99) 123/78 mmHg (12/31 0800) SpO2:  [91 %-92 %] 92 % (12/31 0800) Last BM Date: 01/17/15  Intake/Output from previous day: 12/30 0701 - 12/31 0700 In: 950 [P.O.:840; I.V.:60; IV Piggyback:50] Out: 2175 [Urine:2175] Intake/Output this shift: Total I/O In: 555 [P.O.:240; I.V.:315] Out: 300 [Urine:300]  GI: nontender   Lab Results:   Recent Labs  01/16/15 0404 01/17/15 0636  WBC 19.8* 17.8*  HGB 15.9 16.7  HCT 45.3 46.4  PLT 240 229   BMET  Recent Labs  01/16/15 0404 01/17/15 0636  NA 139 134*  K 3.6 3.3*  CL 103 95*  CO2 26 27  GLUCOSE 154* 151*  BUN 27* 27*  CREATININE 1.16 1.09  CALCIUM 8.5* 8.1*   PT/INR No results for input(s): LABPROT, INR in the last 72 hours. ABG No results for input(s): PHART, HCO3 in the last 72 hours.  Invalid input(s): PCO2, PO2  Studies/Results: Dg Chest Port 1 View  01/15/2015  CLINICAL DATA:  Hypoxia. EXAM: PORTABLE CHEST 1 VIEW COMPARISON:  November 30, 2011. FINDINGS: Stable cardiomegaly is noted. Central pulmonary vascular congestion is noted with mild bilateral perihilar and basilar interstitial densities concerning for possible edema. No pneumothorax is noted. Probable mild left pleural effusion is noted. Bony thorax is unremarkable. IMPRESSION: Cardiomegaly and central pulmonary vascular congestion is noted with probable mild bilateral perihilar and basilar pulmonary edema. Probable mild left pleural effusion is noted. Electronically Signed   By: Marijo Conception, M.D.   On: 01/15/2015 10:16    Anti-infectives: Anti-infectives    Start     Dose/Rate Route Frequency Ordered Stop   01/16/15 0930  ertapenem (INVANZ) 1 g in sodium chloride 0.9 % 50 mL IVPB     1 g 100  mL/hr over 30 Minutes Intravenous Every 24 hours 01/16/15 0841     01/14/15 1245  piperacillin-tazobactam (ZOSYN) IVPB 3.375 g  Status:  Discontinued     3.375 g 12.5 mL/hr over 240 Minutes Intravenous 3 times per day 01/14/15 1238 01/16/15 0841   01/14/15 1230  ciprofloxacin (CIPRO) IVPB 400 mg  Status:  Discontinued     400 mg 200 mL/hr over 60 Minutes Intravenous  Once 01/14/15 1218 01/14/15 1238   01/14/15 1230  metroNIDAZOLE (FLAGYL) IVPB 500 mg  Status:  Discontinued     500 mg 100 mL/hr over 60 Minutes Intravenous  Once 01/14/15 1218 01/14/15 1238      Assessment/Plan: Perforated sigmoid diverticulitis-non tender on exam. WBC a little better afebrile. He never had free air this was discussed by er but is not true.  There are no indications for urgent surgery at present time. Follow closely. Should he clinically decline or develop peritoneal signs, then he would require a exploratory laparotomy/hartman's or lsc washout. I do agree with repeat ct today Hypoxia-tachypnic, tachy, per primary team ID-Invanz day 2 AKI-resolved. VTE prophylaxis-SCD/heparin Atrial fibrillation with RVR-cardizem gtt. hold pradaxa(last dose 0600 12/28). Dispo-continue SDU  [facility] 01/17/2015

## 2015-01-17 NOTE — Progress Notes (Signed)
Subjective:  William Fischer says says that he is breathing well today, but his breathing continues to appear labored. He denies any abdominal pain.  Objective: Vital signs in last 24 hours: Filed Vitals:   01/16/15 1923 01/16/15 2312 01/17/15 0251 01/17/15 0800  BP: 116/71 124/99 112/94 123/78  Pulse: 105 100 121 134  Temp: 100 F (37.8 C) 97.9 F (36.6 C) 98 F (36.7 C) 97.9 F (36.6 C)  TempSrc: Oral Oral Oral Oral  Resp: 39 31 32 34  Height:      Weight:      SpO2: 91% 92% 91% 92%   Weight change:   Intake/Output Summary (Last 24 hours) at 01/17/15 1005 Last data filed at 01/17/15 0800  Gross per 24 hour  Intake   1460 ml  Output   2250 ml  Net   -790 ml   Physical Exam General: Lying bed, mildy diaphoretic. HEENT: Moist mucous membranes, no scleral icterus Cardiovascular: Irregularly irregular rhythm, tachycardic. No murmurs, rubs, or gallops appreciated Pulmonary: Labored breathing. Tachypnic. Scattered expiratory wheezes. Crackles in bases. Abdominal: No tenderness to palpation. No flank tenderness. Bowel sounds normal Extremities: No clubbing, cyanosis. No edema in lower extremities. Psychiatric: Appropriate behavior and affect.  Lab Results: Basic Metabolic Panel:  Recent Labs Lab 01/16/15 0404 01/17/15 0636  NA 139 134*  K 3.6 3.3*  CL 103 95*  CO2 26 27  GLUCOSE 154* 151*  BUN 27* 27*  CREATININE 1.16 1.09  CALCIUM 8.5* 8.1*   Liver Function Tests:  Recent Labs Lab 01/14/15 0906  AST 22  ALT 21  ALKPHOS 85  BILITOT 1.2  PROT 6.7  ALBUMIN 2.6*    Recent Labs Lab 01/14/15 0906  LIPASE 18   CBC:  Recent Labs Lab 01/16/15 0404 01/17/15 0636  WBC 19.8* 17.8*  HGB 15.9 16.7  HCT 45.3 46.4  MCV 92.1 90.4  PLT 240 229    Studies/Results: No results found. Medications: I have reviewed the patient's current medications. Scheduled Meds: . ertapenem  1 g Intravenous Q24H  . furosemide  40 mg Intravenous BID  . heparin  5,000  Units Subcutaneous 3 times per day  . Influenza vac split quadrivalent PF  0.5 mL Intramuscular Tomorrow-1000  . Influenza vac split quadrivalent PF  0.5 mL Intramuscular Tomorrow-1000  . metoprolol  10 mg Intravenous 4 times per day  . pneumococcal 23 valent vaccine  0.5 mL Intramuscular Tomorrow-1000   Continuous Infusions: . diltiazem (CARDIZEM) infusion 15 mg/hr (01/17/15 0800)   PRN Meds:.levalbuterol, LORazepam, morphine injection, ondansetron **OR** ondansetron (ZOFRAN) IV Assessment/Plan:  Perforated sigmoid diverticulitis: Surgery following and does not recommend urgent surgery at this time, with hopes that antibiotics alone will be sufficient. While his abdominal exam is stable, his respiratory status and atrial fibrillation with RVR have been worsening, which could be driven by progressing perforation and inflammatory response, therefore, we will repeat CT imaging today. Per nursing, he did not receive his CT yesterday because he was too anxious. There is a low threshold to consult surgery if his condition deteriorates or has peritoneal signs.  - Ertapenem per surgery recommendations - Morphine 1 mg q3h prn - Zofran prn - Clear liquid per surgery recommendations - CT abdomen pending, one time ativan dose in case he is anxious about going  Pulmonary Edema and Pleural Effusion: Identified on CXR, and persistent crackles on exam. Oxygen requirements are increasing and is currently satting ~90% on 6L. He is at risk for ARDS, and low threshold to  consult PCCM if respiratory status rapidly declines. Afib with RVR could be a contributing factor. Net -790 out. - Incentive spirometry - Xopenex prn - Repeat portable CXR today to assess for worsening pulmonary edema  Atrial Fibrillation with RVR: Chads-Vasc 2 on pradaxa, holding in case of urgent surgery. Rates have been in the 130s. On metoprolol 75 mg BID at home. - Diltiazem drip - Metoprolol 10 mg q6h - Cardiology consulted - Hold  pradaxa   UTI: Ertapenem.  AKI: Baseline Cr around 1. 1.95 in setting of poor PO intake on admission. Likely prerenal, and Cr improved to 1.09 after IVF. Resolved.  DVT prophylaxis: Heparin [place] Dispo: Disposition is deferred at this time, awaiting improvement of current medical problems.  Anticipated discharge in approximately 3-4 day(s).   The patient does have a current PCP Thompson Grayer, MD) and does not need an [facility] hospital follow-up appointment after discharge.  The patient does have transportation limitations that hinder transportation to clinic appointments.  .Services Needed at time of discharge: Y = Yes, Blank = No PT:   OT:   RN:   Equipment:   Other:     LOS: 3 days   Liberty Handy, MD 01/17/2015, 10:05 AM

## 2015-01-17 NOTE — Progress Notes (Signed)
Entered in error

## 2015-01-17 NOTE — Progress Notes (Signed)
   SUBJECTIVE: The patient remains quite ill.  Just returned from CT.  Denies CP.  + SOB.  [name] ertapenem  1 g Intravenous Q24H  . furosemide  40 mg Intravenous BID  . heparin  5,000 Units Subcutaneous 3 times per day  . Influenza vac split quadrivalent PF  0.5 mL Intramuscular Tomorrow-1000  . Influenza vac split quadrivalent PF  0.5 mL Intramuscular Tomorrow-1000  . metoprolol  10 mg Intravenous 4 times per day  . pneumococcal 23 valent vaccine  0.5 mL Intramuscular Tomorrow-1000   . diltiazem (CARDIZEM) infusion 15 mg/hr (01/17/15 1100)    OBJECTIVE: Physical Exam: Filed Vitals:   01/16/15 2312 01/17/15 0251 01/17/15 0800 01/17/15 1100  BP: 124/99 112/94 123/78 123/88  Pulse: 100 121 134 127  Temp: 97.9 F (36.6 C) 98 F (36.7 C) 97.9 F (36.6 C) 98.3 F (36.8 C)  TempSrc: Oral Oral Oral Oral  Resp: 31 32 34 30  Height:      Weight:      SpO2: 92% 91% 92% 93%    Intake/Output Summary (Last 24 hours) at 01/17/15 1305 Last data filed at 01/17/15 1200  Gross per 24 hour  Intake    890 ml  Output   1850 ml  Net   -960 ml    Telemetry reveals afib, V rates 120s-130s  GEN- The patient is ill appearing, alert and oriented x 3 today.   Head- normocephalic, atraumatic Eyes-  Sclera clear, conjunctiva pink Ears- hearing intact Oropharynx- clear Neck- supple,   Lungs- decreased BS at bases, tachypneic Heart- tachycardic irregular rhythm GI- soft, NT, ND, + BS Extremities- no clubbing, cyanosis, or edema Skin- no rash or lesion Psych- euthymic mood, full affect Neuro- strength and sensation are intact  LABS: Basic Metabolic Panel:  Recent Labs  01/16/15 0404 01/17/15 0636  NA 139 134*  K 3.6 3.3*  CL 103 95*  CO2 26 27  GLUCOSE 154* 151*  BUN 27* 27*  CREATININE 1.16 1.09  CALCIUM 8.5* 8.1*   Liver Function Tests: No results for input(s): AST, ALT, ALKPHOS, BILITOT, PROT, ALBUMIN in the last 72 hours. No results for input(s): LIPASE, AMYLASE in the last  72 hours. CBC:  Recent Labs  01/16/15 0404 01/17/15 0636  WBC 19.8* 17.8*  HGB 15.9 16.7  HCT 45.3 46.4  MCV 92.1 90.4  PLT 240 229    ASSESSMENT AND PLAN:  Active Problems:   Diverticulitis of colon with perforation   Hypoxia  1. Permanent atrial fibrillation The patient has longstanding persistent atrial fibrillation. He has not been very successful with lifestyle modification in the past and has declined recommendions including ACE-I and sleep study.  His acute illness is most likely responsible for exacerbation of RVR Continue Dilt drip and metoprolol for now I think that we have to expect a certain amount of tachycardia during his acute illness.   2. Acute systolic dysfunction Likely exacerbated by AF with RVR EF 45-50% Continue IV lasix and follow closely Will switch from cardizem drip to coreg and add ace inhibitor once  Taking POs  3. Perforated diverticula.  per surgical team   Thompson Grayer, MD 01/17/2015 1:05 PM

## 2015-01-17 NOTE — Progress Notes (Signed)
I called general surgeon Dr. Serita Grammes to inform him of the interval development of free intraperitoneal air identified on CT abdomen. Dr. Donne Hazel indicated he would be following up on it.

## 2015-01-17 NOTE — Progress Notes (Signed)
[facility] Progress Note Patient Name: William Fischer DOB: January 22, 1965 MRN: [id number]   Date of Service  01/17/2015  HPI/Events of Note  Multiple issues: 1. AFIB with RVR and 2. Hypotension/Hypovolemia - d/t sepsis and Propofol IV infusion. BP = 106/78 and CVP = 8. Patient is allergic to contrast media, therefore, can't give Amiodarone IV. Plan is to push BP up with fluid and less Propofol and start Cardizem IV if needed and BP will tolerate.   eICU Interventions  Will order: 1. 0.9 NaCl 1 liter IV over 1 hour now.  2. Versed 1 mg IV Q 1 hour PRN agitation. 3. Wean Propofol IV infusion as tolerated.      Intervention Category Major Interventions: Arrhythmia - evaluation and management;Hypotension - evaluation and management;Hypovolemia - evaluation and treatment with fluids  [name],[name] 01/17/2015, 11:37 PM

## 2015-01-17 NOTE — Progress Notes (Signed)
Pt's belongings including cell phone and dentures taken to pt's room 2s15. Consuelo Pandy RN

## 2015-01-17 NOTE — Transfer of Care (Signed)
Immediate Anesthesia Transfer of Care Note  Patient: William Fischer  Procedure(s) Performed: Procedure(s): EXPLORATORY LAPAROTOMY WITH LEFT COLECTOMY AND COLOSTOMY (N/A)  Patient Location: PACU  Anesthesia Type:General  Level of Consciousness: sedated, unresponsive and Patient remains intubated per anesthesia plan  Airway & Oxygen Therapy: Patient remains intubated per anesthesia plan and Patient placed on Ventilator (see vital sign flow sheet for setting)  Post-op Assessment: Report given to RN and Post -op Vital signs reviewed and stable  Post vital signs: Reviewed and stable  Last Vitals:  Filed Vitals:   01/17/15 1100 01/17/15 1508  BP: 123/88 113/76  Pulse: 127 151  Temp: 36.8 C 36.8 C  Resp: 30 18    Complications: No apparent anesthesia complications

## 2015-01-17 NOTE — Anesthesia Preprocedure Evaluation (Addendum)
Anesthesia Evaluation  Patient identified by MRN, date of birth, ID band Patient awake    Reviewed: Allergy & Precautions, H&P , NPO status , Patient's Chart, lab work & pertinent test results  History of Anesthesia Complications Negative for: history of anesthetic complications  Airway Mallampati: III  TM Distance: >3 FB Neck ROM: full    Dental  (+) Edentulous Upper, Edentulous Lower   Pulmonary Current Smoker,    + rhonchi  + decreased breath sounds+ wheezing      Cardiovascular +CHF  + dysrhythmias Atrial Fibrillation  Rhythm:Irregular Rate:Tachycardia     Neuro/Psych  Headaches, PSYCHIATRIC DISORDERS    GI/Hepatic Neg liver ROS, colitis   Endo/Other  Morbid obesity  Renal/GU negative Renal ROS     Musculoskeletal   Abdominal (+) + obese,   Peds  Hematology negative hematology ROS (+)   Anesthesia Other Findings Ruptured diverticula, acutely ill with increaseing O2 requiriment, bilateral crackles on exam, depressed EF around 45% in persistent afib on dilt drip  Pt is tachypnic to >40 breaths/min, shallow breathing, diaphoretic, BP is stable at the moment, pt in rapid A fib receiving oral metop QID and on dil drip at 15mg /hr  Reproductive/Obstetrics negative OB ROS                            Anesthesia Physical Anesthesia Plan  ASA: IV and emergent  Anesthesia Plan: General   Post-op Pain Management:    Induction: Intravenous  Airway Management Planned: Oral ETT  Additional Equipment: Arterial line and CVP  Intra-op Plan:   Post-operative Plan: Post-operative intubation/ventilation  Informed Consent: I have reviewed the patients History and Physical, chart, labs and discussed the procedure including the risks, benefits and alternatives for the proposed anesthesia with the patient or authorized representative who has indicated his/her understanding and acceptance.   Dental  Advisory Given  Plan Discussed with: Anesthesiologist, CRNA and Surgeon  Anesthesia Plan Comments: (Plan GA with ETT, A line and central line placement for vasoactive medication delivery and CVP monitoring with anticipated post op ventilation)        Anesthesia Quick Evaluation

## 2015-01-17 NOTE — Progress Notes (Signed)
Patient ID: William Fischer, male   DOB: Oct 14, 1965, 49 y.o.   MRN: [id number] His ct scan shows worsening with interval development large amount free air. I discussed going to or urgently for likely colectomy, colostomy.  He will remain intubated and need to go to icu postoperatively.  i discussed risks with him which are considerable given comorbidities.   He agrees to surgery.

## 2015-01-17 NOTE — Progress Notes (Signed)
RT NOTE: New ETT holder placed on patient, ALINE redressed and secured. It was reported that the patient returned from OR on Peep of 10. Receiving RT put patient on 100%/5 Peep. Following ABG Peep was increased to 8+. Dr. Melvyn Novas entered vent orders to match current settings.

## 2015-01-17 NOTE — Consult Note (Addendum)
PULMONARY / CRITICAL CARE MEDICINE   Name: William Fischer MRN: [id number] DOB: 11-29-1971    ADMISSION DATE:  01/14/2015 CONSULTATION DATE:  01/17/2015  REFERRING MD :  Dr. Evette Doffing  INITIAL PRESENTATION:  49 CM with h/o Afib on pradaxa a/w abdominal pain due to a perforated sigmoid diverticulum that was contained on admission.  He decompensated and developed a pneumoperitoneum today and taken emergently to the OR.  STUDIES:  CT A/P:  01/17/2015 IMPRESSION: 1. Interval development of significant amount of free intraperitoneal air. 2. Gas and fluid within the sigmoid mesenteric have increased. 3. Fluid collection in the pelvis 6.4 cm without evidence for abscess. 4. Increased left pleural effusion, left basilar consolidation, and right lower lobe atelectasis. 5. Prominent adrenal glands bilaterally, stable in appearance. 6. Critical Value/emergent results were discussed in person at the time of interpretation on 01/17/2015 at 1:30 pm to Dr. Liberty Handy.   SIGNIFICANT EVENTS: To the OR 01/17/2015 for Left colectomy and end colostomy.  After surgery patient returned to ICU, CCM consulted for assistance with ventilator and post-op shock.  PAST MEDICAL HISTORY :   has a past medical history of Chicken pox (as a child); Migraine (06/29/2011); Obesity (06/29/2011); Depression with anxiety (06/29/2011); Atrial fibrillation ([place]); Chronic systolic heart failure ([place]); Cardiomyopathy ([place]); Snoring; Allergy to IVP dye; and Chronic anticoagulation.  has past surgical history that includes TEE without cardioversion (11/30/2011); Cardioversion (11/30/2011); and Cardioversion (12/03/2011). Prior to Admission medications   Medication Sig Start Date End Date Taking? Authorizing Provider  metoprolol (LOPRESSOR) 50 MG tablet TAKE 1 & 1/2 TABLETS (75 MG TOTAL) BY MOUTH 2 (TWO) TIMES DAILY. 04/28/14  Yes Thompson Grayer, MD  PRADAXA 150 MG CAPS capsule TAKE 1 CAPSULE BY MOUTH EVERY 12 HOURS 03/03/14  Yes  Thompson Grayer, MD  spironolactone (ALDACTONE) 25 MG tablet TAKE 1 TABLET BY MOUTH EVERY DAY 03/03/14  Yes Thompson Grayer, MD  furosemide (LASIX) 20 MG tablet TAKE 1 TABLET BY MOUTH EVERY DAY 03/03/14   Thompson Grayer, MD   Allergies  Allergen Reactions  . Contrast Media [Iodinated Diagnostic Agents]     a diffuse macular rash after CTA chest     FAMILY HISTORY:  indicated that his mother is alive. He indicated that his father is deceased. He indicated that both of his sisters are alive. He indicated that his maternal grandmother is deceased. He indicated that his maternal grandfather is deceased. He indicated that his paternal grandmother is deceased. He indicated that his paternal grandfather is deceased. He indicated that both of his sons are alive.  SOCIAL HISTORY:  reports that he has been smoking Cigarettes.  He has a 20 pack-year smoking history. He has never used smokeless tobacco. He reports that he does not drink alcohol or use illicit drugs.  REVIEW OF SYSTEMS:  Unable to obtain due to intubation.    VITAL SIGNS: Temp:  [97.9 F (36.6 C)-98.9 F (37.2 C)] 98.9 F (37.2 C) (12/31 1919) Pulse Rate:  [50-151] 127 (12/31 1930) Resp:  [9-34] 27 (12/31 1930) BP: (88-124)/(56-99) 88/56 mmHg (12/31 1900) SpO2:  [91 %-100 %] 100 % (12/31 1930) FiO2 (%):  [100 %] 100 % (12/31 1804) HEMODYNAMICS:   VENTILATOR SETTINGS: Vent Mode:  [-] PRVC FiO2 (%):  [100 %] 100 % Set Rate:  [14 bmp] 14 bmp Vt Set:  [600 mL] 600 mL PEEP:  [5 cmH20] 5 cmH20 Plateau Pressure:  [19 cmH20] 19 cmH20 INTAKE / OUTPUT:  Intake/Output Summary (Last 24 hours) at 01/17/15 2022  Last data filed at 01/17/15 1900  Gross per 24 hour  Intake 2987.98 ml  Output   1350 ml  Net 1637.98 ml    PHYSICAL EXAMINATION: General:  Sedated, intubated Neuro:  Sedated on propofol.  HEENT:  NCAT, NGT in place, ETT in place. R IJ CVC in place Cardiovascular:  Tachycardic, Irreg, no m/r/g, flat neck veins, MM dry Lungs:   Course, mechanical breath sounds b/l.  No w/r/r Abdomen:  Firm, minimal bowel sounds, Ostomy: pink, no tympany to percussion.  No peritoneal signs Musculoskeletal:  Normal bulk and tone Skin:  Normal color, no c/c/e  LABS:  CBC  Recent Labs Lab 01/16/15 0404 01/17/15 0636 01/17/15 1642 01/17/15 1715 01/17/15 1924  WBC 19.8* 17.8*  --   --  23.4*  HGB 15.9 16.7 16.3 16.7 15.1  HCT 45.3 46.4 48.0 49.0 43.8  PLT 240 229  --   --  224   Coag's No results for input(s): APTT, INR in the last 168 hours. BMET  Recent Labs Lab 01/16/15 0404 01/17/15 0636 01/17/15 1642 01/17/15 1715 01/17/15 1924  NA 139 134* 135 135 135  K 3.6 3.3* 3.5 3.4* 3.6  CL 103 95*  --   --  100*  CO2 26 27  --   --  24  BUN 27* 27*  --   --  30*  CREATININE 1.16 1.09  --   --  1.15  GLUCOSE 154* 151*  --   --  171*   Electrolytes  Recent Labs Lab 01/16/15 0404 01/17/15 0636 01/17/15 1924  CALCIUM 8.5* 8.1* 7.1*   Sepsis Markers  Recent Labs Lab 01/14/15 1237  LATICACIDVEN 1.39   ABG  Recent Labs Lab 01/17/15 1642 01/17/15 1715 01/17/15 1920  PHART 7.383 7.364 7.386  PCO2ART 50.7* 47.9* 48.8*  PO2ART 113.0* 73.0* 90.0   Liver Enzymes  Recent Labs Lab 01/14/15 0906  AST 22  ALT 21  ALKPHOS 85  BILITOT 1.2  ALBUMIN 2.6*   Cardiac Enzymes No results for input(s): TROPONINI, PROBNP in the last 168 hours. Glucose  Recent Labs Lab 01/17/15 1515  GLUCAP 138*    Imaging Ct Abdomen Pelvis Wo Contrast  01/17/2015  CLINICAL DATA:  Perforated bowel.  Followup. EXAM: CT ABDOMEN AND PELVIS WITHOUT CONTRAST TECHNIQUE: Multidetector CT imaging of the abdomen and pelvis was performed following the standard protocol without IV contrast. COMPARISON:  01/14/2015 FINDINGS: Lower chest: Increased left pleural effusion and left basilar consolidation. Increased right lower lobe atelectasis. Heart is mildly enlarged. Upper abdomen: Significant free intraperitoneal air is now noted. Gas  now tracts within the mesentery and extends into the upper abdomen within the retroperitoneum. Small low-attenuation lesion within the posterior aspect of the liver, stable in appearance a not completely characterized measuring 1.0 cm. No focal abnormality within the spleen, pancreas, or kidneys. The adrenal glands are prominent bilaterally without focal mass. Gallbladder is present. Gastrointestinal tract: The colon has a thickened wall beginning at the proximal descending colon, extending through the upper sigmoid colon. There are scattered diverticula. Significant gas is identified surrounding the descending and sigmoid colon, increased since prior study. The adjacent small bowel loops in the left lower quadrant have significant thickening of the wall. Pelvis: There is a new fluid collection in the pelvis measuring 5.5 x 6.4 cm. This is a amorphous and I doubt this has become an abscess. There is fluid within the sigmoid mesentery but this does not appear to be consolidated at this time. Retroperitoneum: No evidence  for aortic aneurysm. No retroperitoneal or mesenteric adenopathy. Abdominal wall: Unremarkable. Osseous structures: Unremarkable. IMPRESSION: 1. Interval development of significant amount of free intraperitoneal air. 2. Gas and fluid within the sigmoid mesenteric have increased. 3. Fluid collection in the pelvis 6.4 cm without evidence for abscess. 4. Increased left pleural effusion, left basilar consolidation, and right lower lobe atelectasis. 5. Prominent adrenal glands bilaterally, stable in appearance. 6. Critical Value/emergent results were discussed in person at the time of interpretation on 01/17/2015 at 1:30 pm to Dr. Liberty Handy. Electronically Signed   By: Nolon Nations M.D.   On: 01/17/2015 13:54   Dg Chest Port 1 View  01/17/2015  CLINICAL DATA:  Dyspnea, atrial fibrillation, chronic heart failure and cardiomyopathy. EXAM: PORTABLE CHEST 1 VIEW COMPARISON:  01/15/2015 FINDINGS:  Degree of pulmonary edema is mildly improved with moderate interstitial edema remaining. Component of left pleural fluid appears less prominent. Heart size also appears less prominent on the portable x-ray. IMPRESSION: Mildly improving pulmonary edema and diminished prominence of left pleural fluid. The heart size also appears less prominent by x-ray. Electronically Signed   By: Aletta Edouard M.D.   On: 01/17/2015 11:20     ASSESSMENT / PLAN:  49yo CM with ischemic colitis with perforation and pneumoperitoneum s/p Left partial colectomy and colostomy with post-op hypovolemic shock.   - propofol and fentanyl - wean vent to sat >88%  PULMONARY A: Intubated for airway protection. P:    - PRVC 500/15/60/5  - Wean ventilator for O2 sat >88%  - ABG acceptable ventilation  - ETT in appropriate position  - Oral care  - PPI  CARDIOVASCULAR A: Hypovolemic shock and atrial fibrillation with RVR P:   - LR x2L  - NS gtt 125/hr  - Check CVP - goal 12-15  - trend lactate  - phenylephrine for MAP >65  - holding metoprolol given shock   - monitor HR as this is improving with resuscitation, if shock resolves restart metoprolol otherwise plan   for amiodarone load   RENAL A:  Oliguria P:    - likely 2/2 hypovolemia  - volume resuscitation as above  GASTROINTESTINAL A:  Ischemic colitis s/p colectomy and colostomy. P:    - NGT in place, continuous suction  - NPO  - further management per Surgery  HEMATOLOGIC A:  Leukocytosis: likely stress response due to surgery and ischemic bowel. P:   - trend  - source control achieved   INFECTIOUS A:  Septic Shock: Acute peritonitis 2/2 perforated bowel.  Source control achieved via surgery.  Given shock and intra-abdominal source with double cover pseudomonas. P:   BCx2 01/18/2015  Abx: Ertapenam 01/17/2015,   Changing to meropenam and adding Levofloxacin start date 01/18/2015 for better pseudomonal coverage   NEUROLOGIC A:  Sedated with  propofol P:   RASS goal: 0  - titrate sedation   - wean sedation as appropriate.  Total critical care time: 30 min  Critical care time was exclusive of separately billable procedures and treating other patients.  Critical care was necessary to treat or prevent imminent or life-threatening deterioration.  Critical care was time spent personally by me on the following activities: development of treatment plan with patient and/or surrogate as well as nursing, discussions with consultants, evaluation of patient's response to treatment, examination of patient, obtaining history from patient or surrogate, ordering and performing treatments and interventions, ordering and review of laboratory studies, ordering and review of radiographic studies, pulse oximetry and re-evaluation of patient's condition.  Meribeth Mattes, DO., MS [facility]     Pulmonary and [place] Pager: [phone]  01/17/2015, 8:22 PM

## 2015-01-17 NOTE — Progress Notes (Signed)
Pt taken to OR on telemetry and 6LNC. Consent signed by patient. Sister Renee notified. Consuelo Pandy RN

## 2015-01-17 NOTE — Anesthesia Postprocedure Evaluation (Signed)
Anesthesia Post Note  Patient: William Fischer  Procedure(s) Performed: Procedure(s) (LRB): EXPLORATORY LAPAROTOMY WITH LEFT COLECTOMY AND COLOSTOMY (N/A)  Patient location during evaluation: ICU Anesthesia Type: General Level of consciousness: sedated and patient remains intubated per anesthesia plan Vital Signs Assessment: post-procedure vital signs reviewed and stable Respiratory status: patient on ventilator - see flowsheet for VS Cardiovascular status: blood pressure returned to baseline, stable and tachycardic Anesthetic complications: no    Last Vitals:  Filed Vitals:   01/17/15 1508 01/17/15 1804  BP: 113/76 102/73  Pulse: 151 124  Temp: 36.8 C   Resp: 18 14    Last Pain:  Filed Vitals:   01/17/15 1805  PainSc: Asleep                 Destenie Ingber

## 2015-01-17 NOTE — Anesthesia Procedure Notes (Addendum)
Central Venous Catheter Insertion Performed by: anesthesiologist Patient location: Pre-op. Preanesthetic checklist: patient identified, IV checked, site marked, risks and benefits discussed, surgical consent, monitors and equipment checked, pre-op evaluation, timeout performed and anesthesia consent Position: Trendelenburg Lidocaine 1% used for infiltration Landmarks identified Catheter size: 8 Fr Central line was placed.Double lumen Procedure performed using ultrasound guided technique. Attempts: 1 Following insertion, dressing applied, line sutured and Biopatch. Post procedure assessment: blood return through all ports. Patient tolerated the procedure well with no immediate complications.   Anesthesia Procedure Note 20G arrow radial A line performed in left radial artery using ultrasound guidance and seldinger technique. No complications.   Veatrice Kells, MD

## 2015-01-17 NOTE — Op Note (Signed)
Preoperative diagnosis: perforated sigmoid colon likely diverticulitis Postoperative diagnosis: ischemic colitis with perforation Procedure: left colectomy, end colostomy Surgeon: Dr Serita Grammes Asst: Dr Leighton Ruff Anes: general EBL: 100 cc Specimens: left colon stitch marks proximal, addition colon at ostomy Complications none Drains none Sponge and needle count correct at end dispo to icu  Indications: This is a 8 yom with multiple comorbidities who presented with what appeared to be perforated contained diverticulitis.  He progressively worsened although had bowel function and abdomen was not tender.  Repeat ct today showed pneumoperitoneum.  I discussed going to or urgently for resection  Procedure: After informed consent was obtained ( I also spoke to his mother over the phone) he was taken to the operating room He was already on antibiotic regimen. He had scds in place.  He was placed under general anesthesia without complication.  He had a right ij line, arterial line, foley and ng tube placed. He was prepped and draped in the standard sterile surgical fashion.  A timeout was performed.    I made a midline incision and upon entering the peritoneum there was a release of air.  There was purulence in the left gutter.  His small bowel was freed and an abscess cavity with pus and air was released on the left side.  His small bowel was run and was all normal. A bookwalter retractor was inserted.  I then began to mobilize the left colon. It appeared that this was not diverticulitis.  This was the left colon and the splenic flexure and these was necrotic.  This had perforated also I think this is more likely ischemic in nature and not diverticular.  His splenic flexure and left colon was mobilized. I also freed the transverse colon. Much of the mesentery was dead.  The other parts were divided with the ligasure device.  I used the gia stapler to divide the colon in the mid transverse colon  and the contour stapler to divide the rectum.  I placed a prolene suture there and secured this to the sidewall.  The abdomen was then irrigated copiously.  The omentum was brought down. The transverse colon was brought through a hole in the omentum and brought to the upper abdomen on the right side.  Some additional mesentery was divided to free this. The stoma was brought through the right abdomen.    I then closed the abdomen with #1 looped PDS.  This wound was packed with saline soaked gauze.   The end of the stoma was marginal so I pulled some more out.  I then divided this and matured it with 3-0 vicryl.  This was viable and patent through the fascia. Appliance was placed and dressings were placed.  He was transferred to the icu in critical condition

## 2015-01-17 NOTE — Progress Notes (Signed)
Pt taken to CT on 6LNC, pt tolerated well. Consuelo Pandy RN

## 2015-01-18 DIAGNOSIS — N179 Acute kidney failure, unspecified: Secondary | ICD-10-CM

## 2015-01-18 DIAGNOSIS — A419 Sepsis, unspecified organism: Secondary | ICD-10-CM

## 2015-01-18 DIAGNOSIS — K5720 Diverticulitis of large intestine with perforation and abscess without bleeding: Principal | ICD-10-CM

## 2015-01-18 DIAGNOSIS — K659 Peritonitis, unspecified: Secondary | ICD-10-CM

## 2015-01-18 DIAGNOSIS — J9601 Acute respiratory failure with hypoxia: Secondary | ICD-10-CM

## 2015-01-18 DIAGNOSIS — I4891 Unspecified atrial fibrillation: Secondary | ICD-10-CM

## 2015-01-18 LAB — BASIC METABOLIC PANEL
Anion gap: 11 (ref 5–15)
BUN: 35 mg/dL — AB (ref 6–20)
CO2: 26 mmol/L (ref 22–32)
Calcium: 7.1 mg/dL — ABNORMAL LOW (ref 8.9–10.3)
Chloride: 99 mmol/L — ABNORMAL LOW (ref 101–111)
Creatinine, Ser: 1.81 mg/dL — ABNORMAL HIGH (ref 0.61–1.24)
GFR, EST AFRICAN AMERICAN: 50 mL/min — AB (ref >60)
GFR, EST NON AFRICAN AMERICAN: 43 mL/min — AB (ref >60)
Glucose, Bld: 196 mg/dL — ABNORMAL HIGH (ref 65–99)
POTASSIUM: 4 mmol/L (ref 3.5–5.1)
SODIUM: 136 mmol/L (ref 135–145)

## 2015-01-18 LAB — GLUCOSE, CAPILLARY
GLUCOSE-CAPILLARY: 157 mg/dL — AB (ref 65–99)
GLUCOSE-CAPILLARY: 172 mg/dL — AB (ref 65–99)
GLUCOSE-CAPILLARY: 178 mg/dL — AB (ref 65–99)
GLUCOSE-CAPILLARY: 188 mg/dL — AB (ref 65–99)
Glucose-Capillary: 212 mg/dL — ABNORMAL HIGH (ref 65–99)
Glucose-Capillary: 175 mg/dL — ABNORMAL HIGH (ref 65–99)

## 2015-01-18 LAB — MAGNESIUM: MAGNESIUM: 1.4 mg/dL — AB (ref 1.7–2.4)

## 2015-01-18 MED ORDER — FENTANYL CITRATE (PF) 100 MCG/2ML IJ SOLN
50.0000 ug | INTRAMUSCULAR | Status: DC | PRN
  Administered 2015-01-18: 50 ug via INTRAVENOUS
  Administered 2015-01-18 – 2015-01-19 (×3): 100 ug via INTRAVENOUS
  Administered 2015-01-19: 50 ug via INTRAVENOUS
  Administered 2015-01-19 – 2015-01-22 (×21): 100 ug via INTRAVENOUS
  Administered 2015-01-22: 50 ug via INTRAVENOUS
  Administered 2015-01-22: 100 ug via INTRAVENOUS
  Administered 2015-01-22: 50 ug via INTRAVENOUS
  Administered 2015-01-23 (×4): 100 ug via INTRAVENOUS
  Filled 2015-01-18 (×33): qty 2

## 2015-01-18 MED ORDER — DEXTROSE 5 % IV SOLN
2.5000 mg/h | INTRAVENOUS | Status: DC
  Administered 2015-01-18: 2.5 mg/h via INTRAVENOUS
  Administered 2015-01-18 – 2015-01-21 (×14): 15 mg/h via INTRAVENOUS
  Filled 2015-01-18 (×16): qty 100

## 2015-01-18 MED ORDER — PANTOPRAZOLE SODIUM 40 MG IV SOLR
40.0000 mg | INTRAVENOUS | Status: DC
  Administered 2015-01-18 – 2015-01-22 (×5): 40 mg via INTRAVENOUS
  Filled 2015-01-18 (×5): qty 40

## 2015-01-18 MED ORDER — MAGNESIUM SULFATE 2 GM/50ML IV SOLN
2.0000 g | Freq: Once | INTRAVENOUS | Status: AC
  Administered 2015-01-18: 2 g via INTRAVENOUS
  Filled 2015-01-18: qty 50

## 2015-01-18 MED ORDER — METOPROLOL TARTRATE 1 MG/ML IV SOLN
5.0000 mg | Freq: Once | INTRAVENOUS | Status: AC
  Administered 2015-01-18: 5 mg via INTRAVENOUS
  Filled 2015-01-18: qty 5

## 2015-01-18 MED ORDER — METOPROLOL TARTRATE 1 MG/ML IV SOLN
5.0000 mg | Freq: Four times a day (QID) | INTRAVENOUS | Status: DC
  Administered 2015-01-18 – 2015-01-21 (×13): 5 mg via INTRAVENOUS
  Filled 2015-01-18 (×11): qty 5

## 2015-01-18 MED ORDER — FUROSEMIDE 10 MG/ML IJ SOLN
40.0000 mg | Freq: Once | INTRAMUSCULAR | Status: AC
  Administered 2015-01-18: 40 mg via INTRAVENOUS
  Filled 2015-01-18: qty 4

## 2015-01-18 MED ORDER — SODIUM CHLORIDE 0.9 % IV SOLN
1.0000 g | Freq: Three times a day (TID) | INTRAVENOUS | Status: DC
  Administered 2015-01-18 – 2015-01-26 (×25): 1 g via INTRAVENOUS
  Filled 2015-01-18 (×29): qty 1

## 2015-01-18 MED ORDER — METOPROLOL TARTRATE 1 MG/ML IV SOLN
INTRAVENOUS | Status: AC
  Filled 2015-01-18: qty 5

## 2015-01-18 MED ORDER — LEVOFLOXACIN IN D5W 750 MG/150ML IV SOLN
750.0000 mg | INTRAVENOUS | Status: DC
  Administered 2015-01-18 – 2015-01-21 (×4): 750 mg via INTRAVENOUS
  Filled 2015-01-18 (×4): qty 150

## 2015-01-18 MED ORDER — INSULIN ASPART 100 UNIT/ML ~~LOC~~ SOLN
0.0000 [IU] | SUBCUTANEOUS | Status: DC
  Administered 2015-01-18 (×3): 3 [IU] via SUBCUTANEOUS
  Administered 2015-01-18: 5 [IU] via SUBCUTANEOUS
  Administered 2015-01-19 (×2): 2 [IU] via SUBCUTANEOUS
  Administered 2015-01-19 (×2): 3 [IU] via SUBCUTANEOUS
  Administered 2015-01-19: 2 [IU] via SUBCUTANEOUS
  Administered 2015-01-19: 3 [IU] via SUBCUTANEOUS
  Administered 2015-01-20 (×2): 2 [IU] via SUBCUTANEOUS
  Administered 2015-01-20: 3 [IU] via SUBCUTANEOUS
  Administered 2015-01-20 (×2): 2 [IU] via SUBCUTANEOUS
  Administered 2015-01-20: 3 [IU] via SUBCUTANEOUS
  Administered 2015-01-21 (×2): 2 [IU] via SUBCUTANEOUS
  Administered 2015-01-21: 5 [IU] via SUBCUTANEOUS
  Administered 2015-01-21 (×3): 2 [IU] via SUBCUTANEOUS

## 2015-01-18 NOTE — Plan of Care (Signed)
Problem: Fluid Volume: Goal: Ability to achieve a balanced intake and output will improve Outcome: Not Progressing Patient required multiple fluid boluses.   Problem: Physical Regulation: Goal: Postoperative complications will be avoided or minimized Outcome: Progressing Patient has afib with RVR and was started with Diltiazem and Magnesium replaced. Afib continues with Diltiazem. Abdomen appears more distended.   Problem: Urinary Elimination: Goal: Ability to achieve and maintain adequate urine output will improve Outcome: Not Progressing Patient urine output remains low despite fluid resuscitation.

## 2015-01-18 NOTE — Progress Notes (Signed)
[facility] Progress Note Patient Name: William Fischer DOB: [birth year] MRN: [id number]   Date of Service  01/18/2015  HPI/Events of Note  Persistent tachycardia despite cardizem drip and one time metoprolol dose  eICU Interventions  Start metoprolol on schedule     Intervention Category Major Interventions: Arrhythmia - evaluation and management  Asencion Noble 01/18/2015, 5:44 PM

## 2015-01-18 NOTE — Procedures (Signed)
Extubation Procedure Note  Patient Details:   Name: KAYODE KORNFELD DOB: Oct 06, 1965 MRN: [id number]   Airway Documentation:  Airway (Active)  Secured at (cm) 23 cm 01/18/2015 11:53 AM  Measured From Lips 01/18/2015 11:53 AM  Secured Location Right 01/18/2015 11:53 AM  Secured By [employer] 01/18/2015  3:45 AM  Tube Holder Repositioned Yes 01/18/2015 11:53 AM  Cuff Pressure (cm H2O) 34 cm H2O 01/17/2015  7:40 PM  Site Condition Dry 01/18/2015 11:53 AM    Evaluation  O2 sats: stable throughout Complications: No apparent complications Patient did tolerate procedure well. Bilateral Breath Sounds: Clear, Diminished Suctioning: Oral, Airway Yes   Pt extubated to 4L [place].  No stridor noted.  RN at bedside.  Donnetta Hail 01/18/2015, 2:44 PM

## 2015-01-18 NOTE — Progress Notes (Signed)
PULMONARY / CRITICAL CARE MEDICINE   Name: William Fischer MRN: [id number] DOB: 1965/05/18    ADMISSION DATE:  01/14/2015 CONSULTATION DATE:  01/17/2015  REFERRING MD:  Dr. Evette Doffing  CHIEF COMPLAINT:  Abdominal pain  SUBJECTIVE:  Tolerating pressure support.  VITAL SIGNS: BP 98/75 mmHg  Pulse 153  Temp(Src) 98.6 F (37 C) (Oral)  Resp 24  Ht 5\' 11"  (1.803 m)  Wt 299 lb 6.2 oz (135.8 kg)  BMI 41.77 kg/m2  SpO2 98%  HEMODYNAMICS: CVP:  [8 mmHg-13 mmHg] 13 mmHg  VENTILATOR SETTINGS: Vent Mode:  [-] PRVC FiO2 (%):  [50 %-100 %] 50 % Set Rate:  [14 bmp] 14 bmp Vt Set:  [600 mL] 600 mL PEEP:  [5 cmH20-8 cmH20] 8 cmH20 Plateau Pressure:  [19 cmH20-28 cmH20] 26 cmH20  INTAKE / OUTPUT: I/O last 3 completed shifts: In: 5926.2 [P.O.:480; I.V.:4506.2; NG/GT:90; IV Piggyback:850] Out: [id number] [Urine:2120; Emesis/NG output:100; Blood:125]  PHYSICAL EXAMINATION: General:  alert Neuro:  Follows commands HEENT:  ETT/NG in place Cardiovascular:  Irregular, tachycardic Lungs:  Basilar crackles Abdomen:  Soft, mild tenderness, colostomy in place Musculoskeletal:  1+ edema Skin:  No rashes  LABS:  BMET  Recent Labs Lab 01/17/15 0636  01/17/15 1715 01/17/15 1924 01/18/15 0030  NA 134*  < > 135 135 136  K 3.3*  < > 3.4* 3.6 4.0  CL 95*  --   --  100* 99*  CO2 27  --   --  24 26  BUN 27*  --   --  30* 35*  CREATININE 1.09  --   --  1.15 1.81*  GLUCOSE 151*  --   --  171* 196*  < > = values in this interval not displayed.  Electrolytes  Recent Labs Lab 01/17/15 0636 01/17/15 1924 01/18/15 0030  CALCIUM 8.1* 7.1* 7.1*  MG  --   --  1.4*    CBC  Recent Labs Lab 01/16/15 0404 01/17/15 0636 01/17/15 1642 01/17/15 1715 01/17/15 1924  WBC 19.8* 17.8*  --   --  23.4*  HGB 15.9 16.7 16.3 16.7 15.1  HCT 45.3 46.4 48.0 49.0 43.8  PLT 240 229  --   --  224    Coag's No results for input(s): APTT, INR in the last 168 hours.  Sepsis Markers  Recent  Labs Lab 01/14/15 1237  LATICACIDVEN 1.39    ABG  Recent Labs Lab 01/17/15 1642 01/17/15 1715 01/17/15 1920  PHART 7.383 7.364 7.386  PCO2ART 50.7* 47.9* 48.8*  PO2ART 113.0* 73.0* 90.0    Liver Enzymes  Recent Labs Lab 01/14/15 0906  AST 22  ALT 21  ALKPHOS 85  BILITOT 1.2  ALBUMIN 2.6*    Cardiac Enzymes No results for input(s): TROPONINI, PROBNP in the last 168 hours.  Glucose  Recent Labs Lab 01/17/15 1515 01/18/15  GLUCAP 138* 188*    Imaging Ct Abdomen Pelvis Wo Contrast  01/17/2015  CLINICAL DATA:  Perforated bowel.  Followup. EXAM: CT ABDOMEN AND PELVIS WITHOUT CONTRAST TECHNIQUE: Multidetector CT imaging of the abdomen and pelvis was performed following the standard protocol without IV contrast. COMPARISON:  01/14/2015 FINDINGS: Lower chest: Increased left pleural effusion and left basilar consolidation. Increased right lower lobe atelectasis. Heart is mildly enlarged. Upper abdomen: Significant free intraperitoneal air is now noted. Gas now tracts within the mesentery and extends into the upper abdomen within the retroperitoneum. Small low-attenuation lesion within the posterior aspect of the liver, stable in appearance a not completely characterized measuring  1.0 cm. No focal abnormality within the spleen, pancreas, or kidneys. The adrenal glands are prominent bilaterally without focal mass. Gallbladder is present. Gastrointestinal tract: The colon has a thickened wall beginning at the proximal descending colon, extending through the upper sigmoid colon. There are scattered diverticula. Significant gas is identified surrounding the descending and sigmoid colon, increased since prior study. The adjacent small bowel loops in the left lower quadrant have significant thickening of the wall. Pelvis: There is a new fluid collection in the pelvis measuring 5.5 x 6.4 cm. This is a amorphous and I doubt this has become an abscess. There is fluid within the sigmoid  mesentery but this does not appear to be consolidated at this time. Retroperitoneum: No evidence for aortic aneurysm. No retroperitoneal or mesenteric adenopathy. Abdominal wall: Unremarkable. Osseous structures: Unremarkable. IMPRESSION: 1. Interval development of significant amount of free intraperitoneal air. 2. Gas and fluid within the sigmoid mesenteric have increased. 3. Fluid collection in the pelvis 6.4 cm without evidence for abscess. 4. Increased left pleural effusion, left basilar consolidation, and right lower lobe atelectasis. 5. Prominent adrenal glands bilaterally, stable in appearance. 6. Critical Value/emergent results were discussed in person at the time of interpretation on 01/17/2015 at 1:30 pm to Dr. Liberty Handy. Electronically Signed   By: Nolon Nations M.D.   On: 01/17/2015 13:54   Dg Chest Port 1 View  01/17/2015  CLINICAL DATA:  Central line placement.  Initial encounter. EXAM: PORTABLE CHEST 1 VIEW COMPARISON:  Chest radiograph performed earlier today at 10:49 a.m. FINDINGS: The patient's endotracheal tube is seen ending 6 cm above the carina. The enteric tube is noted extending below the diaphragm. A right IJ line is noted ending about the proximal SVC. The lungs are well-aerated. Small bilateral pleural effusions noted. Vascular congestion is seen, with bilateral airspace opacification, left greater than right. This raises concern for mildly worsening asymmetric pulmonary edema. There is no evidence of pneumothorax. The cardiomediastinal silhouette is enlarged. No acute osseous abnormalities are seen. IMPRESSION: 1. Endotracheal tube seen ending 6 cm above the carina. 2. Right IJ line noted ending about the proximal SVC. 3. Small bilateral pleural effusions noted. Vascular congestion and cardiomegaly, with bilateral airspace opacification, left greater than right. This raises concern for mildly worsening asymmetric pulmonary edema. Electronically Signed   By: Garald Balding M.D.    On: 01/17/2015 21:41   Dg Chest Port 1 View  01/17/2015  CLINICAL DATA:  Dyspnea, atrial fibrillation, chronic heart failure and cardiomyopathy. EXAM: PORTABLE CHEST 1 VIEW COMPARISON:  01/15/2015 FINDINGS: Degree of pulmonary edema is mildly improved with moderate interstitial edema remaining. Component of left pleural fluid appears less prominent. Heart size also appears less prominent on the portable x-ray. IMPRESSION: Mildly improving pulmonary edema and diminished prominence of left pleural fluid. The heart size also appears less prominent by x-ray. Electronically Signed   By: Aletta Edouard M.D.   On: 01/17/2015 11:20     STUDIES:  12/28 CT abd/pelvis >> perforated sigmoid diverticulitis 12/30 Echo >> EF 45 to 50%, mild MR, mod LA dilation 12/31 CT abd/pelvis >> free air, Lt pleural effusion, Lt base consolidation, basilar ATX  CULTURES: 12/28 Urine >> negative 01/01 Blood >>   ANTIBIOTICS: 12/28 Zosyn >> 12/30 12/30 Levaquin >> 12/30 Meropenem >>  SIGNIFICANT EVENTS: 12/28 Admit, surgery consulted 12/30 Cardiology consulted 12/31 Free air >> to OR for Lt colectomy, end colostomy  LINES/TUBES: 12/31 Rt IJ CVL >> 12/31 ETT >>  DISCUSSION: 50 yo male presented  with LLL abdominal pain from perforated sigmoid colon.  Hx of A fib on pradaxa.  He developed free air and take to OR 12/31.  ASSESSMENT / PLAN:  PULMONARY A: Acute respiratory failure 2nd to peritonitis, possible aspiration, and pleural effusions. P:   Pressure support wean as tolerated >> might be able to extubate soon F/u CXR Lasix 40 mg IV x one on 1/01  CARDIOVASCULAR A:  Sepsis from peritonitis. A fib with RVR. Acute systolic CHF. P:  Pressors to keep MAP > 65 Lopressor 5 mg IV x one on 1/01 Decrease IV fluids to 50 ml/hr Continue cardizem per cardiology  RENAL A:   AKI >> baseline creatinine 0.92 from May 2016. P:   Monitor renal fx, urine outpt  GASTROINTESTINAL A:   Perforated sigmoid  diverticulitis s/p laparotomy 12/31. P:   Post-op care, nutrition per surgery Protonix for SUP  HEMATOLOGIC A:   Leukocytosis. P:  F/u CBC SQ heparin for DVT prophylaxis  INFECTIOUS A:   Peritonitis. ?Aspiration LLL. P:   Day 5 of abx, currently on meropenem and levaquin  ENDOCRINE A:   Hyperglycemia. P:   SSI  NEUROLOGIC A:   Acute encephalopathy 2nd to sepsis. P:   RASS goal: 0  CC time 38 minutes.  Chesley Mires, MD [facility] Pulmonary/Critical Care 01/18/2015, 7:39 AM Pager:  [phone] After 3pm call: [phone]

## 2015-01-18 NOTE — Progress Notes (Signed)
ANTIBIOTIC CONSULT NOTE - INITIAL  Pharmacy Consult for Merrem/Levaquin  Indication: Intra-abdominal infection, POD #1  Allergies  Allergen Reactions  . Contrast Media [Iodinated Diagnostic Agents]     a diffuse macular rash after CTA chest     Patient Measurements: Height: 5\' 11"  (180.3 cm) Weight: 293 lb 3.4 oz (133 kg) IBW/kg (Calculated) : 75.3  Vital Signs: Temp: 99.7 F (37.6 C) (12/31 2354) Temp Source: Oral (12/31 2354) BP: 81/62 mmHg (01/01 0100) Pulse Rate: 160 (01/01 0000) Intake/Output from previous day: 12/31 0701 - 01/01 0700 In: 4116.3 [P.O.:480; I.V.:3056.3; NG/GT:30; IV Piggyback:550] Out: 1080 [Urine:955; Blood:125] Intake/Output from this shift: Total I/O In: 1128.3 [I.V.:1128.3] Out: 85 [Urine:85]  Labs:  Recent Labs  01/16/15 0404 01/17/15 0636 01/17/15 1642 01/17/15 1715 01/17/15 1924 01/18/15 0030  WBC 19.8* 17.8*  --   --  23.4*  --   HGB 15.9 16.7 16.3 16.7 15.1  --   PLT 240 229  --   --  224  --   CREATININE 1.16 1.09  --   --  1.15 1.81*   Estimated Creatinine Clearance: 70.2 mL/min (by C-G formula based on Cr of 1.81). No results for input(s): VANCOTROUGH, VANCOPEAK, VANCORANDOM, GENTTROUGH, GENTPEAK, GENTRANDOM, TOBRATROUGH, TOBRAPEAK, TOBRARND, AMIKACINPEAK, AMIKACINTROU, AMIKACIN in the last 72 hours.   Microbiology: Recent Results (from the past 720 hour(s))  Urine culture     Status: None   Collection Time: 01/14/15 11:33 AM  Result Value Ref Range Status   Specimen Description URINE, RANDOM  Final   Special Requests Normal  Final   Culture MULTIPLE SPECIES PRESENT, SUGGEST RECOLLECTION  Final   Report Status 01/16/2015 FINAL  Final  MRSA PCR Screening     Status: None   Collection Time: 01/14/15  6:05 PM  Result Value Ref Range Status   MRSA by PCR NEGATIVE NEGATIVE Final    Comment:        The GeneXpert MRSA Assay (FDA approved for NASAL specimens only), is one component of a comprehensive MRSA  colonization surveillance program. It is not intended to diagnose MRSA infection nor to guide or monitor treatment for MRSA infections.     Medical History: Past Medical History  Diagnosis Date  . Chicken pox as a child  . Migraine 06/29/2011  . Obesity 06/29/2011  . Depression with anxiety 06/29/2011  . Atrial fibrillation ([place])     admx 11/13 with acute sCHF in setting of RVR  => a. failed DCCV x 2; b. Pradaxa started;  c. failed sotalol  . Chronic systolic heart failure ([place])     a. echo 11/13: Ef 40-45%, diff HK, mod MR, mod LAE, mild RVE, mod RAE, small effusion;   b. TEE 11/13:  EF 35-40%, no LAA clot; Echo 2/14 shows normal EF  . Cardiomyopathy ([place])     likely tachy mediated in setting of AF with RVR  . Snoring     patient needs sleep study - has declined  . Allergy to IVP dye   . Chronic anticoagulation     Pradaxa   Assessment: Changing anti-biotic coverage to Merrem/Levaquin, noted bump in Scr this AM (will need to monitor for any needed anti-biotic dose changes).   Plan:  -Merrem 1g IV q8h -Levaquin 750 mg IV q24h -Trend WBC, temp, renal function  Narda Bonds 01/18/2015,2:06 AM

## 2015-01-18 NOTE — Progress Notes (Signed)
1 Day Post-Op  Subjective: Events noted. On PS. On whiff of neo. On cardizem gtt for Afib (chronic) but rate up. CVP 11  Objective: Vital signs in last 24 hours: Temp:  [98.2 F (36.8 C)-99.7 F (37.6 C)] 99.6 F (37.6 C) (01/01 0720) Pulse Rate:  [26-161] 104 (01/01 1030) Resp:  [9-35] 28 (01/01 1030) BP: (81-123)/(50-88) 97/71 mmHg (01/01 1000) SpO2:  [87 %-100 %] 96 % (01/01 1030) FiO2 (%):  [40 %-100 %] 40 % (01/01 0827) Weight:  [135.8 kg (299 lb 6.2 oz)] 135.8 kg (299 lb 6.2 oz) (01/01 0300) Last BM Date: 01/17/15  Intake/Output from previous day: 12/31 0701 - 01/01 0700 In: 5961.2 [P.O.:480; I.V.:4541.2; NG/GT:90; IV Piggyback:850] Out: 1295 [id number]; Emesis/NG output:100; Blood:125] Intake/Output this shift: Total I/O In: 538.1 [I.V.:508.1; NG/GT:30] Out: 120 [Urine:70; Emesis/NG output:50]  Intubated. Alert, fc Tachy Obese, distended. Ostomy - edematous, viable, no stool in bag No air in bag  Lab Results:   Recent Labs  01/17/15 0636  01/17/15 1715 01/17/15 1924  WBC 17.8*  --   --  23.4*  HGB 16.7  < > 16.7 15.1  HCT 46.4  < > 49.0 43.8  PLT 229  --   --  224  < > = values in this interval not displayed. BMET  Recent Labs  01/17/15 1924 01/18/15 0030  NA 135 136  K 3.6 4.0  CL 100* 99*  CO2 24 26  GLUCOSE 171* 196*  BUN 30* 35*  CREATININE 1.15 1.81*  CALCIUM 7.1* 7.1*   PT/INR No results for input(s): LABPROT, INR in the last 72 hours. ABG  Recent Labs  01/17/15 1715 01/17/15 1920  PHART 7.364 7.386  HCO3 27.3* 29.2*    Studies/Results: Ct Abdomen Pelvis Wo Contrast  01/17/2015  CLINICAL DATA:  Perforated bowel.  Followup. EXAM: CT ABDOMEN AND PELVIS WITHOUT CONTRAST TECHNIQUE: Multidetector CT imaging of the abdomen and pelvis was performed following the standard protocol without IV contrast. COMPARISON:  01/14/2015 FINDINGS: Lower chest: Increased left pleural effusion and left basilar consolidation. Increased right lower lobe  atelectasis. Heart is mildly enlarged. Upper abdomen: Significant free intraperitoneal air is now noted. Gas now tracts within the mesentery and extends into the upper abdomen within the retroperitoneum. Small low-attenuation lesion within the posterior aspect of the liver, stable in appearance a not completely characterized measuring 1.0 cm. No focal abnormality within the spleen, pancreas, or kidneys. The adrenal glands are prominent bilaterally without focal mass. Gallbladder is present. Gastrointestinal tract: The colon has a thickened wall beginning at the proximal descending colon, extending through the upper sigmoid colon. There are scattered diverticula. Significant gas is identified surrounding the descending and sigmoid colon, increased since prior study. The adjacent small bowel loops in the left lower quadrant have significant thickening of the wall. Pelvis: There is a new fluid collection in the pelvis measuring 5.5 x 6.4 cm. This is a amorphous and I doubt this has become an abscess. There is fluid within the sigmoid mesentery but this does not appear to be consolidated at this time. Retroperitoneum: No evidence for aortic aneurysm. No retroperitoneal or mesenteric adenopathy. Abdominal wall: Unremarkable. Osseous structures: Unremarkable. IMPRESSION: 1. Interval development of significant amount of free intraperitoneal air. 2. Gas and fluid within the sigmoid mesenteric have increased. 3. Fluid collection in the pelvis 6.4 cm without evidence for abscess. 4. Increased left pleural effusion, left basilar consolidation, and right lower lobe atelectasis. 5. Prominent adrenal glands bilaterally, stable in appearance. 6. Critical Value/emergent  results were discussed in person at the time of interpretation on 01/17/2015 at 1:30 pm to Dr. Liberty Handy. Electronically Signed   By: Nolon Nations M.D.   On: 01/17/2015 13:54   Dg Chest Port 1 View  01/17/2015  CLINICAL DATA:  Central line placement.   Initial encounter. EXAM: PORTABLE CHEST 1 VIEW COMPARISON:  Chest radiograph performed earlier today at 10:49 a.m. FINDINGS: The patient's endotracheal tube is seen ending 6 cm above the carina. The enteric tube is noted extending below the diaphragm. A right IJ line is noted ending about the proximal SVC. The lungs are well-aerated. Small bilateral pleural effusions noted. Vascular congestion is seen, with bilateral airspace opacification, left greater than right. This raises concern for mildly worsening asymmetric pulmonary edema. There is no evidence of pneumothorax. The cardiomediastinal silhouette is enlarged. No acute osseous abnormalities are seen. IMPRESSION: 1. Endotracheal tube seen ending 6 cm above the carina. 2. Right IJ line noted ending about the proximal SVC. 3. Small bilateral pleural effusions noted. Vascular congestion and cardiomegaly, with bilateral airspace opacification, left greater than right. This raises concern for mildly worsening asymmetric pulmonary edema. Electronically Signed   By: Garald Balding M.D.   On: 01/17/2015 21:41   Dg Chest Port 1 View  01/17/2015  CLINICAL DATA:  Dyspnea, atrial fibrillation, chronic heart failure and cardiomyopathy. EXAM: PORTABLE CHEST 1 VIEW COMPARISON:  01/15/2015 FINDINGS: Degree of pulmonary edema is mildly improved with moderate interstitial edema remaining. Component of left pleural fluid appears less prominent. Heart size also appears less prominent on the portable x-ray. IMPRESSION: Mildly improving pulmonary edema and diminished prominence of left pleural fluid. The heart size also appears less prominent by x-ray. Electronically Signed   By: Aletta Edouard M.D.   On: 01/17/2015 11:20    Anti-infectives: Anti-infectives    Start     Dose/Rate Route Frequency Ordered Stop   01/18/15 0400  levofloxacin (LEVAQUIN) IVPB 750 mg     750 mg 100 mL/hr over 90 Minutes Intravenous Every 24 hours 01/18/15 0210     01/18/15 0215  meropenem  (MERREM) 1 g in sodium chloride 0.9 % 100 mL IVPB     1 g 200 mL/hr over 30 Minutes Intravenous 3 times per day 01/18/15 0210     01/16/15 0930  ertapenem (INVANZ) 1 g in sodium chloride 0.9 % 50 mL IVPB  Status:  Discontinued     1 g 100 mL/hr over 30 Minutes Intravenous Every 24 hours 01/16/15 0841 01/18/15 0203   01/14/15 1245  piperacillin-tazobactam (ZOSYN) IVPB 3.375 g  Status:  Discontinued     3.375 g 12.5 mL/hr over 240 Minutes Intravenous 3 times per day 01/14/15 1238 01/16/15 0841   01/14/15 1230  ciprofloxacin (CIPRO) IVPB 400 mg  Status:  Discontinued     400 mg 200 mL/hr over 60 Minutes Intravenous  Once 01/14/15 1218 01/14/15 1238   01/14/15 1230  metroNIDAZOLE (FLAGYL) IVPB 500 mg  Status:  Discontinued     500 mg 100 mL/hr over 60 Minutes Intravenous  Once 01/14/15 1218 01/14/15 1238      Assessment/Plan: s/p Procedure(s): 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  Ostomy care Start w-d dressing bid Wean Neo for MAP >65 Watch Cr   Leighton Ruff. Redmond Pulling, MD, FACS General, Bariatric, & Minimally Invasive Surgery [facility], [place]  LOS: 4 days    Gayland Curry 01/18/2015

## 2015-01-18 NOTE — Progress Notes (Signed)
   SUBJECTIVE: The patient remains quite ill.  Now recovering s/p GI surgery yesterday.  Intubated  . heparin  5,000 Units Subcutaneous 3 times per day  . insulin aspart  0-15 Units Subcutaneous 6 times per day  . levofloxacin (LEVAQUIN) IV  750 mg Intravenous Q24H  . meropenem (MERREM) IV  1 g Intravenous 3 times per day  . pantoprazole (PROTONIX) IV  40 mg Intravenous Q24H   . sodium chloride 50 mL/hr at 01/18/15 1000  . diltiazem (CARDIZEM) infusion 15 mg/hr (01/18/15 1000)  . fentaNYL infusion INTRAVENOUS 200 mcg/hr (01/18/15 1000)  . phenylephrine (NEO-SYNEPHRINE) Adult infusion 25 mcg/min (01/18/15 1000)  . propofol (DIPRIVAN) infusion Stopped (01/18/15 0000)    OBJECTIVE: Physical Exam: Filed Vitals:   01/18/15 0945 01/18/15 1000 01/18/15 1015 01/18/15 1030  BP:  97/71    Pulse: 113 26 85 104  Temp:      TempSrc:      Resp: 29 30 27 28   Height:      Weight:      SpO2: 98% 100% 92% 96%    Intake/Output Summary (Last 24 hours) at 01/18/15 1051 Last data filed at 01/18/15 1000  Gross per 24 hour  Intake 5859.35 ml  Output   1115 ml  Net 4744.35 ml    Telemetry reveals afib, V rates 120s-130s  GEN- The patient is ill appearing, alert on vent  Head- normocephalic, atraumatic Eyes-  Sclera clear, conjunctiva pink Ears- hearing intact Oropharynx- ETT in place Neck- supple,   Lungs- clear anteriorly Heart- tachycardic irregular rhythm GI- distended Extremities- no clubbing, cyanosis, or edema Skin- no rash or lesion Psych- euthymic mood, full affect Neuro- strength and sensation are intact  LABS: Basic Metabolic Panel:  Recent Labs  01/17/15 1924 01/18/15 0030  NA 135 136  K 3.6 4.0  CL 100* 99*  CO2 24 26  GLUCOSE 171* 196*  BUN 30* 35*  CREATININE 1.15 1.81*  CALCIUM 7.1* 7.1*  MG  --  1.4*   CBC:  Recent Labs  01/17/15 0636  01/17/15 1715 01/17/15 1924  WBC 17.8*  --   --  23.4*  HGB 16.7  < > 16.7 15.1  HCT 46.4  < > 49.0 43.8  MCV  90.4  --   --  91.1  PLT 229  --   --  224  < > = values in this interval not displayed.  ASSESSMENT AND PLAN:  Active Problems:   Diverticulitis of colon with perforation   Hypoxia   Atrial fibrillation with rapid ventricular response (HCC)   Systolic dysfunction with acute on chronic heart failure ([place])  1. Permanent atrial fibrillation The patient has longstanding persistent atrial fibrillation. Now with necrotic bowel and possible embolic event. Continue Dilt drip and metoprolol for now I think that we have to expect a certain amount of tachycardia during his acute illness.  Would initiate anticoagulation with heparin drip once ok from a surgical standpoint (may be a few days) given risk of recurrent embolism  2. Acute systolic dysfunction Likely exacerbated by AF with RVR EF 45-50% Would keep Is/Os negative Will switch from cardizem drip to coreg and add ace inhibitor once taking POs  3. Acute respiratory failure per pulmonary  4. Acute bowel injury Possible ischemic gut/ possible embolization of cardiac source with afib Per surgical team  Thompson Grayer, MD 01/18/2015 10:51 AM

## 2015-01-18 NOTE — Progress Notes (Signed)
[place] Progress Note Patient Name: William Fischer DOB: [birth year] MRN: [id number]   Date of Service  01/18/2015  HPI/Events of Note  K+ = 4.0, Mg++ = 1.4 and Creatinine = 1.81.  eICU Interventions  Will replete Mg++.     Intervention Category Major Interventions: Electrolyte abnormality - evaluation and management  [name],[name] 01/18/2015, 3:12 AM

## 2015-01-18 NOTE — Progress Notes (Signed)
Called Elink concerning patients HR now AFib 150-180s. Luellen Pucker states she will give a message to Dr.Wright because he is tied up at the moment. Continuing to closely monitor patient.   Vena Austria

## 2015-01-18 NOTE — Progress Notes (Signed)
Spoke with Dr. Halford Chessman regarding plan for extubation. Patient has been weaning on PS/CPAP since [id number] with no complications. Pt completely alert, awake, and following commands. Order given for extubation. RT, Richardson Landry, made aware.

## 2015-01-18 NOTE — Progress Notes (Signed)
[place] Progress Note Patient Name: William Fischer DOB: 30-May-1965 MRN: [id number]   Date of Service  01/18/2015  HPI/Events of Note  AFIB w/RVR - V rate = 150's. Now off Propofol on Fentanyl IV infusion and PRN Versed for sedation. BP = 109/58 with MAP = 71. Allergy to contrast media, therefore, no Amiodarone.   eICU Interventions  Will start Cardizem IV infusion for AFIB rate control as tolerated by BP. Await BMP and Mg++ level.      Intervention Category Major Interventions: Arrhythmia - evaluation and management  [name] 01/18/2015, 12:24 AM

## 2015-01-19 ENCOUNTER — Inpatient Hospital Stay (HOSPITAL_COMMUNITY): Payer: Medicaid Other

## 2015-01-19 DIAGNOSIS — J690 Pneumonitis due to inhalation of food and vomit: Secondary | ICD-10-CM

## 2015-01-19 DIAGNOSIS — I5023 Acute on chronic systolic (congestive) heart failure: Secondary | ICD-10-CM

## 2015-01-19 LAB — GLUCOSE, CAPILLARY
GLUCOSE-CAPILLARY: 141 mg/dL — AB (ref 65–99)
GLUCOSE-CAPILLARY: 143 mg/dL — AB (ref 65–99)
GLUCOSE-CAPILLARY: 166 mg/dL — AB (ref 65–99)
Glucose-Capillary: 136 mg/dL — ABNORMAL HIGH (ref 65–99)

## 2015-01-19 LAB — CBC
HCT: 39.1 % (ref 39.0–52.0)
HEMOGLOBIN: 13.7 g/dL (ref 13.0–17.0)
MCH: 31.8 pg (ref 26.0–34.0)
MCHC: 35 g/dL (ref 30.0–36.0)
MCV: 90.7 fL (ref 78.0–100.0)
PLATELETS: 251 10*3/uL (ref 150–400)
RBC: 4.31 MIL/uL (ref 4.22–5.81)
RDW: 15.4 % (ref 11.5–15.5)
WBC: 29.9 10*3/uL — ABNORMAL HIGH (ref 4.0–10.5)

## 2015-01-19 LAB — COMPREHENSIVE METABOLIC PANEL
ALBUMIN: 1.4 g/dL — AB (ref 3.5–5.0)
ALK PHOS: 67 U/L (ref 38–126)
ALT: 26 U/L (ref 17–63)
AST: 47 U/L — AB (ref 15–41)
Anion gap: 9 (ref 5–15)
BILIRUBIN TOTAL: 1.1 mg/dL (ref 0.3–1.2)
BUN: 52 mg/dL — AB (ref 6–20)
CALCIUM: 7.5 mg/dL — AB (ref 8.9–10.3)
CO2: 26 mmol/L (ref 22–32)
Chloride: 104 mmol/L (ref 101–111)
Creatinine, Ser: 1.8 mg/dL — ABNORMAL HIGH (ref 0.61–1.24)
GFR calc Af Amer: 50 mL/min — ABNORMAL LOW (ref >60)
GFR, EST NON AFRICAN AMERICAN: 43 mL/min — AB (ref >60)
GLUCOSE: 166 mg/dL — AB (ref 65–99)
POTASSIUM: 3.5 mmol/L (ref 3.5–5.1)
Sodium: 139 mmol/L (ref 135–145)
TOTAL PROTEIN: 4.9 g/dL — AB (ref 6.5–8.1)

## 2015-01-19 LAB — PHOSPHORUS: PHOSPHORUS: 3.9 mg/dL (ref 2.5–4.6)

## 2015-01-19 LAB — MAGNESIUM: MAGNESIUM: 2.2 mg/dL (ref 1.7–2.4)

## 2015-01-19 MED ORDER — SODIUM CHLORIDE 0.9 % IV SOLN
INTRAVENOUS | Status: DC
  Administered 2015-01-19 – 2015-01-20 (×2): via INTRAVENOUS

## 2015-01-19 MED ORDER — FUROSEMIDE 10 MG/ML IJ SOLN
40.0000 mg | Freq: Once | INTRAMUSCULAR | Status: AC
  Administered 2015-01-19: 40 mg via INTRAVENOUS
  Filled 2015-01-19: qty 4

## 2015-01-19 NOTE — Consult Note (Addendum)
WOC ostomy consult note CCS following for assessment and plan of care to abd wound. Reason for Consult: Consult requested for new colostomy from surgery on 12/31 to RLQ Stoma red and viable, 50% above skin level, 50% flush with skin level from 6:00 o'clock to 11:00 o'clock.   Applied barrier ring and 2 piece pouching appliance to maintain seal.  No stool or flatus at this time, scant amt bloody drainage.  Demonstrated pouch change using barrier ring and 2 piece pouching system.  Pt watched and asked appropriate questions. Will continue teaching sessions when stable and out of ICU. Supplies ordered to bedside for staff nurse use. Julien Girt MSN, RN, [place], [place], [place]

## 2015-01-19 NOTE — Progress Notes (Signed)
2 Days Post-Op  Subjective: Pain controlled, no ostomy output yet, afib, doing fairly well, wants to drink  Objective: Vital signs in last 24 hours: Temp:  [97.8 F (36.6 C)-100 F (37.8 C)] 99.4 F (37.4 C) (01/02 0800) Pulse Rate:  [26-157] 126 (01/02 0800) Resp:  [22-36] 27 (01/02 0800) BP: (82-196)/(46-91) 102/72 mmHg (01/02 0800) SpO2:  [90 %-100 %] 93 % (01/02 0800) FiO2 (%):  [40 %] 40 % (01/01 1200) Weight:  [130.7 kg (288 lb 2.3 oz)] 130.7 kg (288 lb 2.3 oz) (01/02 0448) Last BM Date:  (before surgery)  Intake/Output from previous day: 01/01 0701 - 01/02 0700 In: 2764.4 [I.V.:2194.4; NG/GT:120; IV Piggyback:450] Out: [id number] [Urine:1620; Emesis/NG output:200] Intake/Output this shift: Total I/O In: 95 [I.V.:65; NG/GT:30] Out: 75 [Urine:75]  General appearance: no distress Resp: diminished breath sounds bibasilar Cardio: irregularly irregular rhythm GI: wound clean, ostomy pink but not functional yet, few bs  Lab Results:   Recent Labs  01/17/15 1924 01/19/15 0445  WBC 23.4* 29.9*  HGB 15.1 13.7  HCT 43.8 39.1  PLT 224 251   BMET  Recent Labs  01/18/15 0030 01/19/15 0445  NA 136 139  K 4.0 3.5  CL 99* 104  CO2 26 26  GLUCOSE 196* 166*  BUN 35* 52*  CREATININE 1.81* 1.80*  CALCIUM 7.1* 7.5*   PT/INR No results for input(s): LABPROT, INR in the last 72 hours. ABG  Recent Labs  01/17/15 1715 01/17/15 1920  PHART 7.364 7.386  HCO3 27.3* 29.2*    Studies/Results: Ct Abdomen Pelvis Wo Contrast  01/17/2015  CLINICAL DATA:  Perforated bowel.  Followup. EXAM: CT ABDOMEN AND PELVIS WITHOUT CONTRAST TECHNIQUE: Multidetector CT imaging of the abdomen and pelvis was performed following the standard protocol without IV contrast. COMPARISON:  01/14/2015 FINDINGS: Lower chest: Increased left pleural effusion and left basilar consolidation. Increased right lower lobe atelectasis. Heart is mildly enlarged. Upper abdomen: Significant free intraperitoneal air  is now noted. Gas now tracts within the mesentery and extends into the upper abdomen within the retroperitoneum. Small low-attenuation lesion within the posterior aspect of the liver, stable in appearance a not completely characterized measuring 1.0 cm. No focal abnormality within the spleen, pancreas, or kidneys. The adrenal glands are prominent bilaterally without focal mass. Gallbladder is present. Gastrointestinal tract: The colon has a thickened wall beginning at the proximal descending colon, extending through the upper sigmoid colon. There are scattered diverticula. Significant gas is identified surrounding the descending and sigmoid colon, increased since prior study. The adjacent small bowel loops in the left lower quadrant have significant thickening of the wall. Pelvis: There is a new fluid collection in the pelvis measuring 5.5 x 6.4 cm. This is a amorphous and I doubt this has become an abscess. There is fluid within the sigmoid mesentery but this does not appear to be consolidated at this time. Retroperitoneum: No evidence for aortic aneurysm. No retroperitoneal or mesenteric adenopathy. Abdominal wall: Unremarkable. Osseous structures: Unremarkable. IMPRESSION: 1. Interval development of significant amount of free intraperitoneal air. 2. Gas and fluid within the sigmoid mesenteric have increased. 3. Fluid collection in the pelvis 6.4 cm without evidence for abscess. 4. Increased left pleural effusion, left basilar consolidation, and right lower lobe atelectasis. 5. Prominent adrenal glands bilaterally, stable in appearance. 6. Critical Value/emergent results were discussed in person at the time of interpretation on 01/17/2015 at 1:30 pm to Dr. Liberty Handy. Electronically Signed   By: Nolon Nations M.D.   On: 01/17/2015 13:54  Dg Chest Port 1 View  01/19/2015  CLINICAL DATA:  50 year old male with respiratory distress shortness of breath. EXAM: PORTABLE CHEST 1 VIEW COMPARISON:  Chest x-ray  01/17/2015. FINDINGS: Patient has been extubated. A nasogastric tube is seen extending into the stomach, however, the tip of the nasogastric tube extends below the lower margin of the image. There is a right-sided internal jugular central venous catheter with tip terminating in the proximal superior vena cava. Lung volumes are slightly low. Widespread patchy asymmetrically distributed interstitial and airspace opacities throughout the lungs bilaterally, with relative sparing of the right upper lobe. Overall aeration is very similar to the prior study. Small right and moderate left pleural effusions. Pulmonary vasculature does not appear engorged. Heart size is mildly enlarged. Upper mediastinal contours are within normal limits. IMPRESSION: 1. Support apparatus, as above. 2. Patchy asymmetrically distributed interstitial and airspace opacities in the lungs bilaterally favored to reflect a multilobar pneumonia. 3. Mild cardiomegaly. Electronically Signed   By: Vinnie Langton M.D.   On: 01/19/2015 07:26   Dg Chest Port 1 View  01/17/2015  CLINICAL DATA:  Central line placement.  Initial encounter. EXAM: PORTABLE CHEST 1 VIEW COMPARISON:  Chest radiograph performed earlier today at 10:49 a.m. FINDINGS: The patient's endotracheal tube is seen ending 6 cm above the carina. The enteric tube is noted extending below the diaphragm. A right IJ line is noted ending about the proximal SVC. The lungs are well-aerated. Small bilateral pleural effusions noted. Vascular congestion is seen, with bilateral airspace opacification, left greater than right. This raises concern for mildly worsening asymmetric pulmonary edema. There is no evidence of pneumothorax. The cardiomediastinal silhouette is enlarged. No acute osseous abnormalities are seen. IMPRESSION: 1. Endotracheal tube seen ending 6 cm above the carina. 2. Right IJ line noted ending about the proximal SVC. 3. Small bilateral pleural effusions noted. Vascular congestion  and cardiomegaly, with bilateral airspace opacification, left greater than right. This raises concern for mildly worsening asymmetric pulmonary edema. Electronically Signed   By: Garald Balding M.D.   On: 01/17/2015 21:41   Dg Chest Port 1 View  01/17/2015  CLINICAL DATA:  Dyspnea, atrial fibrillation, chronic heart failure and cardiomyopathy. EXAM: PORTABLE CHEST 1 VIEW COMPARISON:  01/15/2015 FINDINGS: Degree of pulmonary edema is mildly improved with moderate interstitial edema remaining. Component of left pleural fluid appears less prominent. Heart size also appears less prominent on the portable x-ray. IMPRESSION: Mildly improving pulmonary edema and diminished prominence of left pleural fluid. The heart size also appears less prominent by x-ray. Electronically Signed   By: Aletta Edouard M.D.   On: 01/17/2015 11:20    Anti-infectives: Anti-infectives    Start     Dose/Rate Route Frequency Ordered Stop   01/18/15 0400  levofloxacin (LEVAQUIN) IVPB 750 mg     750 mg 100 mL/hr over 90 Minutes Intravenous Every 24 hours 01/18/15 0210     01/18/15 0215  meropenem (MERREM) 1 g in sodium chloride 0.9 % 100 mL IVPB     1 g 200 mL/hr over 30 Minutes Intravenous 3 times per day 01/18/15 0210     01/16/15 0930  ertapenem (INVANZ) 1 g in sodium chloride 0.9 % 50 mL IVPB  Status:  Discontinued     1 g 100 mL/hr over 30 Minutes Intravenous Every 24 hours 01/16/15 0841 01/18/15 0203   01/14/15 1245  piperacillin-tazobactam (ZOSYN) IVPB 3.375 g  Status:  Discontinued     3.375 g 12.5 mL/hr over 240 Minutes Intravenous 3  times per day 01/14/15 1238 01/16/15 0841   01/14/15 1230  ciprofloxacin (CIPRO) IVPB 400 mg  Status:  Discontinued     400 mg 200 mL/hr over 60 Minutes Intravenous  Once 01/14/15 1218 01/14/15 1238   01/14/15 1230  metroNIDAZOLE (FLAGYL) IVPB 500 mg  Status:  Discontinued     500 mg 100 mL/hr over 60 Minutes Intravenous  Once 01/14/15 1218 01/14/15 1238       Assessment/Plan: POD 2 left colectomy/ostomy for ischemic colitis  1.  Cont prn fentanyl 2. Will clamp ng today, let him have ice chips as he has bowel sounds, not much ng output 3. oob 4. Dressing changes 5. D2/7 abx 6. Scds, lovenox  Corin Formisano 01/19/2015

## 2015-01-19 NOTE — Progress Notes (Signed)
SUBJECTIVE: The patient remains quite ill.  Now recovering s/p GI surgery 01/17/15.  Extubated yesterday  . furosemide  40 mg Intravenous Once  . heparin  5,000 Units Subcutaneous 3 times per day  . insulin aspart  0-15 Units Subcutaneous 6 times per day  . levofloxacin (LEVAQUIN) IV  750 mg Intravenous Q24H  . meropenem (MERREM) IV  1 g Intravenous 3 times per day  . metoprolol  5 mg Intravenous 4 times per day  . pantoprazole (PROTONIX) IV  40 mg Intravenous Q24H   . sodium chloride 50 mL/hr at 01/19/15 0900  . diltiazem (CARDIZEM) infusion 15 mg/hr (01/19/15 0900)    OBJECTIVE: Physical Exam: Filed Vitals:   01/19/15 0600 01/19/15 0700 01/19/15 0800 01/19/15 0900  BP: 105/64 104/70 102/72 101/81  Pulse: 151 115 126 142  Temp:   99.4 F (37.4 C)   TempSrc:   Oral   Resp: 30 29 27 28   Height:      Weight:      SpO2: 93% 93% 93% 96%    Intake/Output Summary (Last 24 hours) at 01/19/15 [id number] Last data filed at 01/19/15 0900  Gross per 24 hour  Intake 2514.38 ml  Output   1900 ml  Net 614.38 ml    Telemetry reveals afib, V rates 120s-130s  GEN- The patient is ill appearing, alert on vent  Head- normocephalic, atraumatic Eyes-  Sclera clear, conjunctiva pink Ears- hearing intact Oropharynx- NGT in place, ETT has been removed Neck- supple,   Lungs- clear anteriorly Heart- tachycardic irregular rhythm GI- distended Extremities- no clubbing, cyanosis, or edema Skin- no rash or lesion Psych- euthymic mood, full affect Neuro- strength and sensation are intact  LABS: Basic Metabolic Panel:  Recent Labs  01/18/15 0030 01/19/15 0445  NA 136 139  K 4.0 3.5  CL 99* 104  CO2 26 26  GLUCOSE 196* 166*  BUN 35* 52*  CREATININE 1.81* 1.80*  CALCIUM 7.1* 7.5*  MG 1.4* 2.2  PHOS  --  3.9   CBC:  Recent Labs  01/17/15 1924 01/19/15 0445  WBC 23.4* 29.9*  HGB 15.1 13.7  HCT 43.8 39.1  MCV 91.1 90.7  PLT 224 251    ASSESSMENT AND PLAN:  Active  Problems:   Diverticulitis of colon with perforation   Hypoxia   Atrial fibrillation with rapid ventricular response (HCC)   Systolic dysfunction with acute on chronic heart failure (HCC)  1. Permanent atrial fibrillation The patient has longstanding persistent atrial fibrillation. Now with necrotic bowel and possible embolic event. Continue Dilt drip and metoprolol for now.  I do not think that more aggressive rate control would provide benefit at this time. I think that we have to expect a certain amount of tachycardia during his acute illness.  Would initiate anticoagulation with heparin drip once ok from a surgical standpoint given risk of recurrent embolism.  Upon speaking with Dr Donne Hazel, it may be possible to initiate heparin drip tomorrow.  We will reassess in am.  Pt reports compliance with pradaxa before embolic event.  Though there could have been issues with medicine absorption, I also worry about compliance.  Ultimately, it may be better to put him on coumadin in the future rather than restarting pradaxa.   2. Acute systolic dysfunction Likely exacerbated by AF with RVR EF 45-50% Would keep Is/Os negative.  Lasix given this am by Dr Halford Chessman. Will switch from cardizem drip to coreg and add ace inhibitor once taking POs (probably a couple  more days)  3. Acute respiratory failure per pulmonary  4. Acute bowel injury Possible ischemic gut/ possible embolization of cardiac source with afib Per surgical team  Thompson Grayer, MD 01/19/2015 9:37 AM

## 2015-01-19 NOTE — Progress Notes (Signed)
PULMONARY / CRITICAL CARE MEDICINE   Name: William Fischer MRN: [id number] DOB: 10-29-1965    ADMISSION DATE:  01/14/2015 CONSULTATION DATE:  01/17/2015  REFERRING MD:  Dr. Evette Doffing  CHIEF COMPLAINT:  Abdominal pain  SUBJECTIVE:  Denies chest pain.  Breathing okay.  Abdominal pain better.  VITAL SIGNS: BP 101/81 mmHg  Pulse 142  Temp(Src) 99.4 F (37.4 C) (Oral)  Resp 28  Ht 5\' 11"  (1.803 m)  Wt 288 lb 2.3 oz (130.7 kg)  BMI 40.21 kg/m2  SpO2 96%  HEMODYNAMICS: CVP:  [5 mmHg-6 mmHg] 6 mmHg  INTAKE / OUTPUT: I/O last 3 completed shifts: In: 5737.6 [I.V.:4807.6; NG/GT:180; IV Piggyback:750] Out: 2120 [Urine:1820; Emesis/NG output:300]  PHYSICAL EXAMINATION: General:  alert Neuro:  Follows commands, normal strength HEENT: NG in place Cardiovascular:  Irregular, tachycardic Lungs:  Basilar crackles Abdomen:  Soft, mild tenderness, colostomy in place Musculoskeletal:  1+ edema Skin:  No rashes  LABS:  BMET  Recent Labs Lab 01/17/15 1924 01/18/15 0030 01/19/15 0445  NA 135 136 139  K 3.6 4.0 3.5  CL 100* 99* 104  CO2 24 26 26   BUN 30* 35* 52*  CREATININE 1.15 1.81* 1.80*  GLUCOSE 171* 196* 166*    Electrolytes  Recent Labs Lab 01/17/15 1924 01/18/15 0030 01/19/15 0445  CALCIUM 7.1* 7.1* 7.5*  MG  --  1.4* 2.2  PHOS  --   --  3.9    CBC  Recent Labs Lab 01/17/15 0636  01/17/15 1715 01/17/15 1924 01/19/15 0445  WBC 17.8*  --   --  23.4* 29.9*  HGB 16.7  < > 16.7 15.1 13.7  HCT 46.4  < > 49.0 43.8 39.1  PLT 229  --   --  224 251  < > = values in this interval not displayed.  Coag's No results for input(s): APTT, INR in the last 168 hours.  Sepsis Markers  Recent Labs Lab 01/14/15 1237  LATICACIDVEN 1.39    ABG  Recent Labs Lab 01/17/15 1642 01/17/15 1715 01/17/15 1920  PHART 7.383 7.364 7.386  PCO2ART 50.7* 47.9* 48.8*  PO2ART 113.0* 73.0* 90.0    Liver Enzymes  Recent Labs Lab 01/14/15 0906 01/19/15 0445  AST  22 47*  ALT 21 26  ALKPHOS 85 67  BILITOT 1.2 1.1  ALBUMIN 2.6* 1.4*    Cardiac Enzymes No results for input(s): TROPONINI, PROBNP in the last 168 hours.  Glucose  Recent Labs Lab 01/18/15 0717 01/18/15 1237 01/18/15 1553 01/18/15 1914 01/18/15 2346 01/19/15 0342  GLUCAP 212* 175* 157* 172* 178* 141*    Imaging Dg Chest Port 1 View  01/19/2015  CLINICAL DATA:  50 year old male with respiratory distress shortness of breath. EXAM: PORTABLE CHEST 1 VIEW COMPARISON:  Chest x-ray 01/17/2015. FINDINGS: Patient has been extubated. A nasogastric tube is seen extending into the stomach, however, the tip of the nasogastric tube extends below the lower margin of the image. There is a right-sided internal jugular central venous catheter with tip terminating in the proximal superior vena cava. Lung volumes are slightly low. Widespread patchy asymmetrically distributed interstitial and airspace opacities throughout the lungs bilaterally, with relative sparing of the right upper lobe. Overall aeration is very similar to the prior study. Small right and moderate left pleural effusions. Pulmonary vasculature does not appear engorged. Heart size is mildly enlarged. Upper mediastinal contours are within normal limits. IMPRESSION: 1. Support apparatus, as above. 2. Patchy asymmetrically distributed interstitial and airspace opacities in the lungs bilaterally favored to  reflect a multilobar pneumonia. 3. Mild cardiomegaly. Electronically Signed   By: Vinnie Langton M.D.   On: 01/19/2015 07:26     STUDIES:  12/28 CT abd/pelvis >> perforated sigmoid diverticulitis 12/30 Echo >> EF 45 to 50%, mild MR, mod LA dilation 12/31 CT abd/pelvis >> free air, Lt pleural effusion, Lt base consolidation, basilar ATX  CULTURES: 12/28 Urine >> negative 01/01 Blood >>   ANTIBIOTICS: 12/28 Zosyn >> 12/30 12/30 Levaquin >> 12/30 Meropenem >>  SIGNIFICANT EVENTS: 12/28 Admit, surgery consulted 12/30 Cardiology  consulted 12/31 Free air >> to OR for Lt colectomy, end colostomy 01/01 Extubated, weaned off pressors  LINES/TUBES: 12/31 Rt IJ CVL >> 12/31 ETT >> 01/01  DISCUSSION: 50 yo male presented with LLL abdominal pain from perforated sigmoid colon.  Hx of A fib on pradaxa.  He developed free air and taken to OR 12/31.  ASSESSMENT / PLAN:  PULMONARY A: Acute respiratory failure 2nd to peritonitis, possible aspiration, and pleural effusions. P:   Oxygen to keep SpO2 > 92% Bronchial hygiene F/u CXR Lasix 40 mg IV x one on 1/02  CARDIOVASCULAR A:  Sepsis from peritonitis. A fib with RVR. Acute systolic CHF. P:  Lopressor 5 mg IV q6h until able to take oral medications Continue IV fluids at 50 ml/hr Continue cardizem per cardiology Resume anticoagulation when okay with surgery  RENAL A:   AKI >> baseline creatinine 0.92 from May 2016. P:   Monitor renal fx, urine outpt  GASTROINTESTINAL A:   Perforated sigmoid diverticulitis s/p laparotomy 12/31. P:   Post-op care, nutrition, NG tube per surgery Protonix for SUP  HEMATOLOGIC A:   Leukocytosis. P:  F/u CBC SQ heparin for DVT prophylaxis  INFECTIOUS A:   Peritonitis. Aspiration LLL. P:   Day 6/10 of abx, currently on meropenem and levaquin  ENDOCRINE A:   Hyperglycemia. P:   SSI  NEUROLOGIC A:   Acute encephalopathy 2nd to sepsis >> much improved. P:   Monitor mental status  Disposition >> will need keep in ICU while adjusting cardizem gtt.  Chesley Mires, MD [facility] Pulmonary/Critical Care 01/19/2015, 9:02 AM Pager:  [phone] After 3pm call: [phone]

## 2015-01-20 ENCOUNTER — Ambulatory Visit: Payer: Medicaid Other | Admitting: Nurse Practitioner

## 2015-01-20 ENCOUNTER — Encounter (HOSPITAL_COMMUNITY): Payer: Self-pay | Admitting: General Surgery

## 2015-01-20 DIAGNOSIS — R0902 Hypoxemia: Secondary | ICD-10-CM

## 2015-01-20 LAB — BASIC METABOLIC PANEL
ANION GAP: 9 (ref 5–15)
BUN: 40 mg/dL — ABNORMAL HIGH (ref 6–20)
CALCIUM: 7.6 mg/dL — AB (ref 8.9–10.3)
CO2: 29 mmol/L (ref 22–32)
Chloride: 102 mmol/L (ref 101–111)
Creatinine, Ser: 1.31 mg/dL — ABNORMAL HIGH (ref 0.61–1.24)
Glucose, Bld: 156 mg/dL — ABNORMAL HIGH (ref 65–99)
POTASSIUM: 3.6 mmol/L (ref 3.5–5.1)
Sodium: 140 mmol/L (ref 135–145)

## 2015-01-20 LAB — GLUCOSE, CAPILLARY
GLUCOSE-CAPILLARY: 157 mg/dL — AB (ref 65–99)
GLUCOSE-CAPILLARY: 198 mg/dL — AB (ref 65–99)
GLUCOSE-CAPILLARY: 138 mg/dL — AB (ref 65–99)
Glucose-Capillary: 132 mg/dL — ABNORMAL HIGH (ref 65–99)
Glucose-Capillary: 133 mg/dL — ABNORMAL HIGH (ref 65–99)
Glucose-Capillary: 137 mg/dL — ABNORMAL HIGH (ref 65–99)
Glucose-Capillary: 194 mg/dL — ABNORMAL HIGH (ref 65–99)

## 2015-01-20 LAB — CBC
HEMATOCRIT: 40.1 % (ref 39.0–52.0)
HEMOGLOBIN: 13.7 g/dL (ref 13.0–17.0)
MCH: 31.2 pg (ref 26.0–34.0)
MCHC: 34.2 g/dL (ref 30.0–36.0)
MCV: 91.3 fL (ref 78.0–100.0)
Platelets: 300 10*3/uL (ref 150–400)
RBC: 4.39 MIL/uL (ref 4.22–5.81)
RDW: 15.8 % — ABNORMAL HIGH (ref 11.5–15.5)
WBC: 27.8 10*3/uL — ABNORMAL HIGH (ref 4.0–10.5)

## 2015-01-20 LAB — HEPARIN LEVEL (UNFRACTIONATED): Heparin Unfractionated: <0.1 IU/mL — ABNORMAL LOW (ref 0.30–0.70)

## 2015-01-20 MED ORDER — HEPARIN (PORCINE) IN NACL 100-0.45 UNIT/ML-% IJ SOLN
3300.0000 [IU]/h | INTRAMUSCULAR | Status: DC
  Administered 2015-01-20: 1400 [IU]/h via INTRAVENOUS
  Administered 2015-01-21: 2400 [IU]/h via INTRAVENOUS
  Administered 2015-01-21: 1750 [IU]/h via INTRAVENOUS
  Administered 2015-01-22: 3300 [IU]/h via INTRAVENOUS
  Administered 2015-01-22: 2800 [IU]/h via INTRAVENOUS
  Administered 2015-01-23: 3300 [IU]/h via INTRAVENOUS
  Filled 2015-01-20 (×9): qty 250

## 2015-01-20 NOTE — Progress Notes (Signed)
Utilization review completed.  

## 2015-01-20 NOTE — Progress Notes (Signed)
ANTICOAGULATION CONSULT NOTE - Initial Consult  Pharmacy Consult for heparin Indication: atrial fibrillation  Allergies  Allergen Reactions  . Contrast Media [Iodinated Diagnostic Agents]     a diffuse macular rash after CTA chest     Patient Measurements: Height: 5\' 11"  (180.3 cm) Weight: (!) 303 lb (137.44 kg) IBW/kg (Calculated) : 75.3 Heparin Dosing Weight: 105kg   Vital Signs: Temp: 97.3 F (36.3 C) (01/03 0735) Temp Source: Oral (01/03 0735) BP: 116/88 mmHg (01/03 1000) Pulse Rate: 128 (01/03 1000)  Labs:  Recent Labs  01/17/15 1924 01/18/15 0030 01/19/15 0445 01/20/15 0400  HGB 15.1  --  13.7 13.7  HCT 43.8  --  39.1 40.1  PLT 224  --  251 300  CREATININE 1.15 1.81* 1.80* 1.31*    Estimated Creatinine Clearance: 98.7 mL/min (by C-G formula based on Cr of 1.31).   Medical History: Past Medical History  Diagnosis Date  . Chicken pox as a child  . Migraine 06/29/2011  . Obesity 06/29/2011  . Depression with anxiety 06/29/2011  . Atrial fibrillation ([place])     admx 11/13 with acute sCHF in setting of RVR  => a. failed DCCV x 2; b. Pradaxa started;  c. failed sotalol  . Chronic systolic heart failure ([place])     a. echo 11/13: Ef 40-45%, diff HK, mod MR, mod LAE, mild RVE, mod RAE, small effusion;   b. TEE 11/13:  EF 35-40%, no LAA clot; Echo 2/14 shows normal EF  . Cardiomyopathy ([place])     likely tachy mediated in setting of AF with RVR  . Snoring     patient needs sleep study - has declined  . Allergy to IVP dye   . Chronic anticoagulation     Pradaxa   Assessment: 49 yom presented to the hospital with abdominal pain now s/p colectomy and end colostomy on 12/31. He was on chronic pradaxa PTA for afib but this has been on hold as patient has not been taking any PO meds. He was on heparin SQ during this period. Now to start IV heparin without a bolus. Last SQ heparin dose was this AM. CBC is WNL and no bleeding noted.   Goal of Therapy:  Heparin level  0.3-0.7 units/ml Monitor platelets by anticoagulation protocol: Yes   Plan:  - Heparin gtt 1400 units/hr - Check an 8 hour heparin level - Daily heparin level and CBC - F/u plans for oral anticoagulation  William Fischer, Rande Lawman 01/20/2015,10:57 AM

## 2015-01-20 NOTE — Plan of Care (Signed)
Problem: Bowel/Gastric: Goal: Gastrointestinal status for postoperative course will improve Outcome: Progressing Patient had a bowel movement through his rectum once, moderate amount of liquid stools. Colostomy was filled with gas.   Problem: Cardiac: Goal: Will show no evidence of cardiac arrhythmias Outcome: Not Progressing Heart rate is better compared to yesterday. Heart rate was running from 120's to 130's.

## 2015-01-20 NOTE — Progress Notes (Signed)
3 Days Post-Op  Subjective: DOING OK THIRSTY  Objective: Vital signs in last 24 hours: Temp:  [97.3 F (36.3 C)-99.6 F (37.6 C)] 97.3 F (36.3 C) (01/03 0735) Pulse Rate:  [109-148] 114 (01/03 0800) Resp:  [24-40] 24 (01/03 0800) BP: (97-124)/(54-89) 115/73 mmHg (01/03 0800) SpO2:  [93 %-100 %] 96 % (01/03 0800) Weight:  [137.44 kg (303 lb)] 137.44 kg (303 lb) (01/03 0430) Last BM Date: 01/20/15  Intake/Output from previous day: 01/02 0701 - 01/03 0700 In: 2240 [P.O.:200; I.V.:1560; NG/GT:30; IV Piggyback:450] Out: [id number] [Urine:3235] Intake/Output this shift: Total I/O In: 165 [P.O.:100; I.V.:65] Out: 225 [Urine:125; Stool:100]  Incision/Wound:WOUND OPEN AND CLEAN OSTOMY VIABLE AND FUNCTIONING   Lab Results:   Recent Labs  01/19/15 0445 01/20/15 0400  WBC 29.9* 27.8*  HGB 13.7 13.7  HCT 39.1 40.1  PLT 251 300   BMET  Recent Labs  01/19/15 0445 01/20/15 0400  NA 139 140  K 3.5 3.6  CL 104 102  CO2 26 29  GLUCOSE 166* 156*  BUN 52* 40*  CREATININE 1.80* 1.31*  CALCIUM 7.5* 7.6*   PT/INR No results for input(s): LABPROT, INR in the last 72 hours. ABG  Recent Labs  01/17/15 1715 01/17/15 1920  PHART 7.364 7.386  HCO3 27.3* 29.2*    Studies/Results: Dg Chest Port 1 View  01/19/2015  CLINICAL DATA:  50 year old male with respiratory distress shortness of breath. EXAM: PORTABLE CHEST 1 VIEW COMPARISON:  Chest x-ray 01/17/2015. FINDINGS: Patient has been extubated. A nasogastric tube is seen extending into the stomach, however, the tip of the nasogastric tube extends below the lower margin of the image. There is a right-sided internal jugular central venous catheter with tip terminating in the proximal superior vena cava. Lung volumes are slightly low. Widespread patchy asymmetrically distributed interstitial and airspace opacities throughout the lungs bilaterally, with relative sparing of the right upper lobe. Overall aeration is very similar to the prior  study. Small right and moderate left pleural effusions. Pulmonary vasculature does not appear engorged. Heart size is mildly enlarged. Upper mediastinal contours are within normal limits. IMPRESSION: 1. Support apparatus, as above. 2. Patchy asymmetrically distributed interstitial and airspace opacities in the lungs bilaterally favored to reflect a multilobar pneumonia. 3. Mild cardiomegaly. Electronically Signed   By: Vinnie Langton M.D.   On: 01/19/2015 07:26    Anti-infectives: Anti-infectives    Start     Dose/Rate Route Frequency Ordered Stop   01/18/15 0400  levofloxacin (LEVAQUIN) IVPB 750 mg     750 mg 100 mL/hr over 90 Minutes Intravenous Every 24 hours 01/18/15 0210     01/18/15 0215  meropenem (MERREM) 1 g in sodium chloride 0.9 % 100 mL IVPB     1 g 200 mL/hr over 30 Minutes Intravenous 3 times per day 01/18/15 0210     01/16/15 0930  ertapenem (INVANZ) 1 g in sodium chloride 0.9 % 50 mL IVPB  Status:  Discontinued     1 g 100 mL/hr over 30 Minutes Intravenous Every 24 hours 01/16/15 0841 01/18/15 0203   01/14/15 1245  piperacillin-tazobactam (ZOSYN) IVPB 3.375 g  Status:  Discontinued     3.375 g 12.5 mL/hr over 240 Minutes Intravenous 3 times per day 01/14/15 1238 01/16/15 0841   01/14/15 1230  ciprofloxacin (CIPRO) IVPB 400 mg  Status:  Discontinued     400 mg 200 mL/hr over 60 Minutes Intravenous  Once 01/14/15 1218 01/14/15 1238   01/14/15 1230  metroNIDAZOLE (FLAGYL) IVPB 500  mg  Status:  Discontinued     500 mg 100 mL/hr over 60 Minutes Intravenous  Once 01/14/15 1218 01/14/15 1238      Assessment/Plan: s/p Procedure(s): EXPLORATORY LAPAROTOMY WITH LEFT COLECTOMY AND COLOSTOMY (N/A) Start clears OOB WATCH WBC FOR NOW CONTINUE ABX   LOS: 6 days    Kayleana Waites A. 01/20/2015

## 2015-01-20 NOTE — Progress Notes (Signed)
PULMONARY / CRITICAL CARE MEDICINE   Name: William Fischer MRN: [id number] DOB: May 07, 1965    ADMISSION DATE:  01/14/2015 CONSULTATION DATE:  01/17/2015  REFERRING MD:  Dr. Evette Doffing  CHIEF COMPLAINT:  Abdominal pain  SUBJECTIVE:  Wants something to drink.  VITAL SIGNS: BP 115/73 mmHg  Pulse 114  Temp(Src) 97.3 F (36.3 C) (Oral)  Resp 24  Ht 5\' 11"  (1.803 m)  Wt 303 lb (137.44 kg)  BMI 42.28 kg/m2  SpO2 96%  HEMODYNAMICS: CVP:  [6 mmHg-8 mmHg] 8 mmHg  INTAKE / OUTPUT: I/O last 3 completed shifts: In: [id number] [P.O.:200; I.V.:2515; NG/GT:60; IV Piggyback:800] Out: [id number]; Emesis/NG output:50]  PHYSICAL EXAMINATION: General:  Alert, sitting in chair Neuro:  Follows commands, normal strength HEENT: no sinus tenderness Cardiovascular:  Irregular, tachycardic Lungs: no wheeze Abdomen:  Soft, colostomy in place Musculoskeletal:  1+ edema Skin:  No rashes  LABS:  BMET  Recent Labs Lab 01/18/15 0030 01/19/15 0445 01/20/15 0400  NA 136 139 140  K 4.0 3.5 3.6  CL 99* 104 102  CO2 26 26 29   BUN 35* 52* 40*  CREATININE 1.81* 1.80* 1.31*  GLUCOSE 196* 166* 156*    Electrolytes  Recent Labs Lab 01/18/15 0030 01/19/15 0445 01/20/15 0400  CALCIUM 7.1* 7.5* 7.6*  MG 1.4* 2.2  --   PHOS  --  3.9  --     CBC  Recent Labs Lab 01/17/15 1924 01/19/15 0445 01/20/15 0400  WBC 23.4* 29.9* 27.8*  HGB 15.1 13.7 13.7  HCT 43.8 39.1 40.1  PLT 224 251 300    Coag's No results for input(s): APTT, INR in the last 168 hours.  Sepsis Markers  Recent Labs Lab 01/14/15 1237  LATICACIDVEN 1.39    ABG  Recent Labs Lab 01/17/15 1642 01/17/15 1715 01/17/15 1920  PHART 7.383 7.364 7.386  PCO2ART 50.7* 47.9* 48.8*  PO2ART 113.0* 73.0* 90.0    Liver Enzymes  Recent Labs Lab 01/14/15 0906 01/19/15 0445  AST 22 47*  ALT 21 26  ALKPHOS 85 67  BILITOT 1.2 1.1  ALBUMIN 2.6* 1.4*    Cardiac Enzymes No results for input(s): TROPONINI,  PROBNP in the last 168 hours.  Glucose  Recent Labs Lab 01/19/15 0803 01/19/15 1146 01/19/15 1525 01/19/15 1935 01/19/15 2357 01/20/15 0400  GLUCAP 157* 143* 136* 166* 137* 133*    Imaging No results found.   STUDIES:  12/28 CT abd/pelvis >> perforated sigmoid diverticulitis 12/30 Echo >> EF 45 to 50%, mild MR, mod LA dilation 12/31 CT abd/pelvis >> free air, Lt pleural effusion, Lt base consolidation, basilar ATX  CULTURES: 12/28 Urine >> negative 01/01 Blood >>   ANTIBIOTICS: 12/28 Zosyn >> 12/30 12/30 Levaquin >> 12/30 Meropenem >>  SIGNIFICANT EVENTS: 12/28 Admit, surgery consulted 12/30 Cardiology consulted 12/31 Free air >> to OR for Lt colectomy, end colostomy 01/01 Extubated, weaned off pressors  LINES/TUBES: 12/31 Rt IJ CVL >> 12/31 ETT >> 01/01  DISCUSSION: 50 yo male presented with LLL abdominal pain from perforated sigmoid colon.  Hx of A fib on pradaxa.  He developed free air and taken to OR 12/31.  ASSESSMENT / PLAN:  PULMONARY A: Acute respiratory failure 2nd to peritonitis, possible aspiration, and pleural effusions. P:   Oxygen to keep SpO2 > 92% Bronchial hygiene F/u CXR intermittently  CARDIOVASCULAR A:  Sepsis from peritonitis >> resolved. A fib with RVR. Acute systolic CHF. P:  Lopressor 5 mg IV q6h until able to take oral medications Continue IV  fluids at 50 ml/hr until able to eat Continue cardizem per cardiology Resume anticoagulation when okay with surgery  RENAL A:   AKI >> baseline creatinine 0.92 from May 2016. P:   Monitor renal fx, urine outpt  GASTROINTESTINAL A:   Perforated sigmoid diverticulitis s/p laparotomy 12/31. P:   Post-op care, nutrition, per surgery Protonix for SUP  HEMATOLOGIC A:   Leukocytosis. P:  F/u CBC SQ heparin for DVT prophylaxis  INFECTIOUS A:   Peritonitis. Aspiration LLL. P:   Day 7/10 of abx, currently on meropenem and levaquin  ENDOCRINE A:   Hyperglycemia. P:    SSI  NEUROLOGIC A:   Acute encephalopathy 2nd to sepsis >> much improved. P:   Monitor mental status  Disposition >> will need keep in ICU while adjusting cardizem gtt.  Chesley Mires, MD [facility] Pulmonary/Critical Care 01/20/2015, 8:10 AM Pager:  [phone] After 3pm call: [phone]

## 2015-01-20 NOTE — Progress Notes (Signed)
Per Dr. Brantley Stage ok to start Hep gtt today without bolus as well as oral medications; Dr. Rayann Heman paged to make aware; will cont. To monitor.  Ruben Reason

## 2015-01-20 NOTE — Progress Notes (Signed)
SUBJECTIVE: The patient remains quite ill.  Now recovering s/p GI surgery 01/17/15.  Extubated 01/18/15  . heparin  5,000 Units Subcutaneous 3 times per day  . insulin aspart  0-15 Units Subcutaneous 6 times per day  . levofloxacin (LEVAQUIN) IV  750 mg Intravenous Q24H  . meropenem (MERREM) IV  1 g Intravenous 3 times per day  . metoprolol  5 mg Intravenous 4 times per day  . pantoprazole (PROTONIX) IV  40 mg Intravenous Q24H   . sodium chloride 50 mL/hr at 01/20/15 0800  . diltiazem (CARDIZEM) infusion 15 mg/hr (01/20/15 0800)    OBJECTIVE: Physical Exam: Filed Vitals:   01/20/15 0600 01/20/15 0700 01/20/15 0735 01/20/15 0800  BP: 109/69 98/69  115/73  Pulse:  110  114  Temp:   97.3 F (36.3 C)   TempSrc:   Oral   Resp: 30 27  24   Height:      Weight:      SpO2:  98%  96%    Intake/Output Summary (Last 24 hours) at 01/20/15 [id number] Last data filed at 01/20/15 0845  Gross per 24 hour  Intake   2365 ml  Output   3260 ml  Net   -895 ml    Telemetry reveals afib, V rates 110s-140s  GEN- The patient is ill appearing, alert on vent  Head- normocephalic, atraumatic Eyes-  Sclera clear, conjunctiva pink Ears- hearing intact Oropharynx- NGT d/c Neck- supple,   Lungs- clear anteriorly Heart- tachycardic irregular rhythm GI- distended, colostomy in placve, surgical dressing in place Extremities- no clubbing, cyanosis, or edema Skin- no rash or lesion Psych- euthymic mood, full affect Neuro- strength and sensation are intact  LABS: Basic Metabolic Panel:  Recent Labs  01/18/15 0030 01/19/15 0445 01/20/15 0400  NA 136 139 140  K 4.0 3.5 3.6  CL 99* 104 102  CO2 26 26 29   GLUCOSE 196* 166* 156*  BUN 35* 52* 40*  CREATININE 1.81* 1.80* 1.31*  CALCIUM 7.1* 7.5* 7.6*  MG 1.4* 2.2  --   PHOS  --  3.9  --    CBC:  Recent Labs  01/19/15 0445 01/20/15 0400  WBC 29.9* 27.8*  HGB 13.7 13.7  HCT 39.1 40.1  MCV 90.7 91.3  PLT 251 300    ASSESSMENT AND  PLAN:  Active Problems:   Diverticulitis of colon with perforation   Hypoxia   Atrial fibrillation with rapid ventricular response (HCC)   Systolic dysfunction with acute on chronic heart failure ([place])  1. Permanent atrial fibrillation The patient has longstanding persistent atrial fibrillation. Now with necrotic bowel and possible embolic event, POD # 3 Continue on Dilt drip and metoprolol, rates 100-140's, he reports waxing and waning post-op pain 5-7/10 and OOB this morning, BP looks OK Would initiate anticoagulation with heparin drip once ok from a surgical standpoint given risk of recurrent embolism.  Pt reports compliance with pradaxa before embolic event.  Though there could have been issues with medicine absorption, I also worry about compliance.  Ultimately, it may be better to put him on coumadin in the future rather than restarting pradaxa.   2. Acute systolic dysfunction Likely exacerbated by AF with RVR EF 45-50% Would keep Is/Os negative.  Lasix given yesterday, fluid negative. Will switch from cardizem drip to coreg and add ace inhibitor once taking POs reliably, to start today per surgery note  3. Acute respiratory failure per pulmonary  4. Acute bowel injury, leukocytosis down some Possible ischemic gut/perforation possible  embolization of cardiac source with afib S/p exploratory lap, left colectomy/colostomy POD # 3 Per surgical team  Baldwin Jamaica, PA-C 01/20/2015 9:18 AM   I have seen, examined the patient, and reviewed the above assessment and plan.  On exam, slightly improved, tachycardic irregular rhythm. Changes to above are made where necessary.   Start IV heparin when ok with surgery.   Co Sign: Thompson Grayer, MD 01/20/2015 10:29 AM

## 2015-01-20 NOTE — Progress Notes (Signed)
ANTICOAGULATION CONSULT NOTE - Follow Up Consult  Pharmacy Consult for heparin Indication: Afib  Allergies  Allergen Reactions  . Contrast Media [Iodinated Diagnostic Agents]     a diffuse macular rash after CTA chest     Patient Measurements: Height: 5\' 11"  (180.3 cm) Weight: (!) 303 lb (137.44 kg) IBW/kg (Calculated) : 75.3 Heparin Dosing Weight: 105 kg  Vital Signs: Temp: 98.5 F (36.9 C) (01/03 1934) Temp Source: Oral (01/03 1934) BP: 119/73 mmHg (01/03 2100) Pulse Rate: 112 (01/03 2100)  Labs:  Recent Labs  01/18/15 0030 01/19/15 0445 01/20/15 0400 01/20/15 2111  HGB  --  13.7 13.7  --   HCT  --  39.1 40.1  --   PLT  --  251 300  --   HEPARINUNFRC  --   --   --  <0.10*  CREATININE 1.81* 1.80* 1.31*  --     Estimated Creatinine Clearance: 98.7 mL/min (by C-G formula based on Cr of 1.31).   Assessment: 19 yom presented to the hospital with abdominal pain now s/p colectomy and end colostomy on 12/31. He was on chronic pradaxa PTA for afib but this has been on hold as patient has not been taking any PO meds. He was on heparin SQ during this period. Now to start IV heparin without a bolus. Last SQ heparin dose was this AM. CBC is WNL and no bleeding noted. First HL was undetectable. No issues per RN.  Goal of Therapy:  Heparin level 0.3-0.7 units/ml Monitor platelets by anticoagulation protocol: Yes   Plan:  Increase heparin gtt to to 1750 units/hr Check 6 hr HL Monitor daily HL, CBC, s/s of bleed  Elenor Quinones, PharmD, [facility] Clinical Pharmacist Pager [phone] 01/20/2015 9:49 PM

## 2015-01-21 ENCOUNTER — Inpatient Hospital Stay (HOSPITAL_COMMUNITY): Payer: Medicaid Other

## 2015-01-21 LAB — GLUCOSE, CAPILLARY
GLUCOSE-CAPILLARY: 129 mg/dL — AB (ref 65–99)
GLUCOSE-CAPILLARY: 207 mg/dL — AB (ref 65–99)
Glucose-Capillary: 118 mg/dL — ABNORMAL HIGH (ref 65–99)
Glucose-Capillary: 125 mg/dL — ABNORMAL HIGH (ref 65–99)
Glucose-Capillary: 135 mg/dL — ABNORMAL HIGH (ref 65–99)
Glucose-Capillary: 136 mg/dL — ABNORMAL HIGH (ref 65–99)

## 2015-01-21 LAB — HEPARIN LEVEL (UNFRACTIONATED)
Heparin Unfractionated: <0.1 IU/mL — ABNORMAL LOW (ref 0.30–0.70)
Heparin Unfractionated: <0.1 IU/mL — ABNORMAL LOW (ref 0.30–0.70)

## 2015-01-21 LAB — BASIC METABOLIC PANEL
Anion gap: 9 (ref 5–15)
BUN: 24 mg/dL — AB (ref 6–20)
CHLORIDE: 97 mmol/L — AB (ref 101–111)
CO2: 29 mmol/L (ref 22–32)
CREATININE: 1 mg/dL (ref 0.61–1.24)
Calcium: 7.6 mg/dL — ABNORMAL LOW (ref 8.9–10.3)
GFR calc Af Amer: >60 mL/min (ref >60)
GFR calc non Af Amer: >60 mL/min (ref >60)
GLUCOSE: 119 mg/dL — AB (ref 65–99)
Potassium: 4.5 mmol/L (ref 3.5–5.1)
SODIUM: 135 mmol/L (ref 135–145)

## 2015-01-21 MED ORDER — CARVEDILOL 25 MG PO TABS
25.0000 mg | ORAL_TABLET | Freq: Two times a day (BID) | ORAL | Status: DC
  Administered 2015-01-21: 25 mg via ORAL
  Filled 2015-01-21: qty 1

## 2015-01-21 MED ORDER — HEPARIN BOLUS VIA INFUSION
3000.0000 [IU] | Freq: Once | INTRAVENOUS | Status: AC
  Administered 2015-01-21: 3000 [IU] via INTRAVENOUS
  Filled 2015-01-21: qty 3000

## 2015-01-21 MED ORDER — FUROSEMIDE 10 MG/ML IJ SOLN
20.0000 mg | Freq: Three times a day (TID) | INTRAMUSCULAR | Status: AC
  Administered 2015-01-22 (×2): 20 mg via INTRAVENOUS
  Filled 2015-01-21 (×2): qty 2

## 2015-01-21 MED ORDER — IPRATROPIUM BROMIDE 0.02 % IN SOLN
0.5000 mg | Freq: Four times a day (QID) | RESPIRATORY_TRACT | Status: DC
  Administered 2015-01-21 – 2015-01-22 (×3): 0.5 mg via RESPIRATORY_TRACT
  Filled 2015-01-21 (×3): qty 2.5

## 2015-01-21 MED ORDER — CARVEDILOL 25 MG PO TABS: 25.0000 mg | ORAL_TABLET | Freq: Two times a day (BID) | ORAL | Status: DC

## 2015-01-21 MED ORDER — CETYLPYRIDINIUM CHLORIDE 0.05 % MT LIQD
7.0000 mL | Freq: Two times a day (BID) | OROMUCOSAL | Status: DC
  Administered 2015-01-21 – 2015-02-02 (×21): 7 mL via OROMUCOSAL

## 2015-01-21 MED ORDER — LEVALBUTEROL HCL 0.63 MG/3ML IN NEBU
0.6300 mg | INHALATION_SOLUTION | Freq: Four times a day (QID) | RESPIRATORY_TRACT | Status: DC
  Administered 2015-01-21 – 2015-01-22 (×3): 0.63 mg via RESPIRATORY_TRACT
  Filled 2015-01-21 (×3): qty 3

## 2015-01-21 MED ORDER — WARFARIN SODIUM 5 MG PO TABS
10.0000 mg | ORAL_TABLET | Freq: Once | ORAL | Status: AC
  Administered 2015-01-21: 10 mg via ORAL
  Filled 2015-01-21: qty 2

## 2015-01-21 MED ORDER — HEPARIN BOLUS VIA INFUSION
3000.0000 [IU] | Freq: Once | INTRAVENOUS | Status: DC
  Filled 2015-01-21: qty 3000

## 2015-01-21 MED ORDER — WARFARIN - PHARMACIST DOSING INPATIENT: Freq: Every day | Status: DC

## 2015-01-21 NOTE — Progress Notes (Signed)
[place] for Merrem/Levaquin  Indication: Intra-abdominal infection  Allergies  Allergen Reactions  . Contrast Media [Iodinated Diagnostic Agents]     a diffuse macular rash after CTA chest     Patient Measurements: Height: 5\' 11"  (180.3 cm) Weight: (!) 312 lb 13.3 oz (141.9 kg) IBW/kg (Calculated) : 75.3  Vital Signs: Temp: 97.7 F (36.5 C) (01/04 0412) Temp Source: Oral (01/04 0412) BP: 104/84 mmHg (01/04 0700) Pulse Rate: 95 (01/04 0700) Intake/Output from previous day: 01/03 0701 - 01/04 0700 In: 3439.5 [P.O.:1120; I.V.:1869.5; IV Piggyback:450] Out: 2970 [Urine:2470; Stool:500] Intake/Output from this shift:    Labs:  Recent Labs  01/19/15 0445 01/20/15 0400 01/21/15 0500  WBC 29.9* 27.8*  --   HGB 13.7 13.7  --   PLT 251 300  --   CREATININE 1.80* 1.31* 1.00   Estimated Creatinine Clearance: 131.6 mL/min (by C-G formula based on Cr of 1). No results for input(s): VANCOTROUGH, VANCOPEAK, VANCORANDOM, GENTTROUGH, GENTPEAK, GENTRANDOM, TOBRATROUGH, TOBRAPEAK, TOBRARND, AMIKACINPEAK, AMIKACINTROU, AMIKACIN in the last 72 hours.   Microbiology: Recent Results (from the past 720 hour(s))  Urine culture     Status: None   Collection Time: 01/14/15 11:33 AM  Result Value Ref Range Status   Specimen Description URINE, RANDOM  Final   Special Requests Normal  Final   Culture MULTIPLE SPECIES PRESENT, SUGGEST RECOLLECTION  Final   Report Status 01/16/2015 FINAL  Final  MRSA PCR Screening     Status: None   Collection Time: 01/14/15  6:05 PM  Result Value Ref Range Status   MRSA by PCR NEGATIVE NEGATIVE Final    Comment:        The GeneXpert MRSA Assay (FDA approved for NASAL specimens only), is one component of a comprehensive MRSA colonization surveillance program. It is not intended to diagnose MRSA infection nor to guide or monitor treatment for MRSA infections.   Culture, blood (routine x 2)     Status: None  (Preliminary result)   Collection Time: 01/18/15  3:34 AM  Result Value Ref Range Status   Specimen Description BLOOD RIGHT HAND  Final   Special Requests BOTTLES DRAWN AEROBIC ONLY 5CC  Final   Culture NO GROWTH 2 DAYS  Final   Report Status PENDING  Incomplete  Culture, blood (routine x 2)     Status: None (Preliminary result)   Collection Time: 01/18/15  3:39 AM  Result Value Ref Range Status   Specimen Description BLOOD LEFT HAND  Final   Special Requests BOTTLES DRAWN AEROBIC AND ANAEROBIC 5CC   Final   Culture NO GROWTH 2 DAYS  Final   Report Status PENDING  Incomplete   Assessment: 46 yom continues on D#4 of meropenem and levaquin. Pt is afebrile and WBC is elevated. Scr improved to 1. Doses remain appropriate.    Zosyn 12/28>> 12/30 Ertapenem 12/30 >> 12/31 Levaquin 1/1 >> Meropenem 1/1 >>  12/28 UCx>> neg 1/1 blood x 2- NGTD  Plan:  - Continue meropenem 1gm IV Q8H - Continue levaquin 750mg  IV Q24H - F/u renal fxn, C&S, clinical status and LOT  William Fischer, Rande Lawman 01/21/2015,8:07 AM

## 2015-01-21 NOTE — Consult Note (Addendum)
WOC follow-up: Pt had pouch change performed last night related to overfilling of his pouch, according to staff nurse.  Plan to perform another educational session and pouch change tomorrow. Supplies in room for bedside nurse use. Julien Girt MSN, RN, [place], [place], [place]

## 2015-01-21 NOTE — Progress Notes (Signed)
[place] Progress Note Patient Name: William Fischer DOB: 1965-09-21 MRN: [id number]   Date of Service  01/21/2015  HPI/Events of Note  RN reports mild hypoxia responsive to Optima oxygen. Patient previously smoker. Has some wheezing on physical exam per RN. Appears reasonably comfortable on camera check.  eICU Interventions  Xopenex & Atroven nebs q6hr     Intervention Category Major Interventions: Respiratory failure - evaluation and management  Tera Partridge 01/21/2015, 7:31 PM

## 2015-01-21 NOTE — Progress Notes (Signed)
--  Progressively increasing O2 requirements. CXR shows possible LLL effusion vs. Atelectasis.   Will give lasix 20 mg IV times 2.

## 2015-01-21 NOTE — Progress Notes (Signed)
ANTICOAGULATION CONSULT NOTE - Follow Up Consult  Pharmacy Consult for heparin Indication: Afib  Allergies  Allergen Reactions  . Contrast Media [Iodinated Diagnostic Agents]     a diffuse macular rash after CTA chest     Patient Measurements: Height: 5\' 11"  (180.3 cm) Weight: (!) 312 lb 13.3 oz (141.9 kg) IBW/kg (Calculated) : 75.3 Heparin Dosing Weight: 105 kg  Vital Signs: Temp: 98.1 F (36.7 C) (01/04 1100) Temp Source: Oral (01/04 1100) BP: 111/76 mmHg (01/04 1502) Pulse Rate: 101 (01/04 1502)  Labs:  Recent Labs  01/19/15 0445 01/20/15 0400 01/20/15 2111 01/21/15 0500 01/21/15 1330  HGB 13.7 13.7  --   --   --   HCT 39.1 40.1  --   --   --   PLT 251 300  --   --   --   HEPARINUNFRC  --   --  <0.10* <0.10* <0.10*  CREATININE 1.80* 1.31*  --  1.00  --     Estimated Creatinine Clearance: 131.6 mL/min (by C-G formula based on Cr of 1).   Assessment: 46 yom presented to the hospital with abdominal pain now s/p colectomy and end colostomy on 12/31. He was on chronic pradaxa PTA for afib but this has been on hold as patient has not been taking any PO meds. He was on heparin SQ during this period. Currently he continues on IV heparin. Heparin level remains subtherapeutic despite dose adjustments. CBC is WNL and no bleeding noted.   Goal of Therapy:  Heparin level 0.3-0.7 units/ml Monitor platelets by anticoagulation protocol: Yes   Plan:  - Increase heparin gtt to 2400 units/hr - no bolus per surgery - Check an 8 hour heparin level - Daily heparin level and CBC - F/u ability to restart oral anticoagulation  Salome Arnt, PharmD, BCPS Pager # [phone] 01/21/2015 3:17 PM

## 2015-01-21 NOTE — Progress Notes (Signed)
ANTICOAGULATION CONSULT NOTE - Follow Up Consult  Pharmacy Consult for heparin Indication: Afib  Allergies  Allergen Reactions  . Contrast Media [Iodinated Diagnostic Agents]     a diffuse macular rash after CTA chest     Patient Measurements: Height: 5\' 11"  (180.3 cm) Weight: (!) 312 lb 13.3 oz (141.9 kg) IBW/kg (Calculated) : 75.3 Heparin Dosing Weight: 105 kg  Vital Signs: Temp: 97.7 F (36.5 C) (01/04 0412) Temp Source: Oral (01/04 0412) BP: 110/81 mmHg (01/04 0400) Pulse Rate: 115 (01/04 0400)  Labs:  Recent Labs  01/19/15 0445 01/20/15 0400 01/20/15 2111 01/21/15 0500  HGB 13.7 13.7  --   --   HCT 39.1 40.1  --   --   PLT 251 300  --   --   HEPARINUNFRC  --   --  <0.10* <0.10*  CREATININE 1.80* 1.31*  --   --     Estimated Creatinine Clearance: 100.5 mL/min (by C-G formula based on Cr of 1.31).   Assessment: 13 yom presented to the hospital with abdominal pain now s/p colectomy and end colostomy on 12/31. He was on chronic pradaxa PTA for afib but this has been on hold as patient has not been taking any PO meds. He was on heparin SQ during this period. Currently on IV heparin without a bolus. CBC is WNL and no bleeding noted. HL remains undetectable. H/H and Plt wnl. RN reports no s/s of bleeding  Goal of Therapy:  Heparin level 0.3-0.7 units/ml Monitor platelets by anticoagulation protocol: Yes   Plan:  Bolus 3000 units once. Avoid any further boluses per surgery  Increase heparin gtt to to 2000 units/hr.  Check 6 hr HL Monitor daily HL, CBC, s/s of bleed  Albertina Parr, PharmD., BCPS Clinical Pharmacist Pager [phone]

## 2015-01-21 NOTE — Progress Notes (Signed)
Patient ID: William Fischer, male   DOB: May 16, 1965, 50 y.o.   MRN: [id number] 4 Days Post-Op  Subjective: Pt feels well this morning.  His bag exploded overnight.  Tolerating clear liquids well.  Hasn't ambulated yet just got up to a chair  Objective: Vital signs in last 24 hours: Temp:  [97.7 F (36.5 C)-98.5 F (36.9 C)] 98.2 F (36.8 C) (01/04 0700) Pulse Rate:  [94-136] 102 (01/04 0800) Resp:  [22-34] 29 (01/04 0800) BP: (104-125)/(62-88) 105/72 mmHg (01/04 0800) SpO2:  [89 %-100 %] 90 % (01/04 0800) Weight:  [141.9 kg (312 lb 13.3 oz)] 141.9 kg (312 lb 13.3 oz) (01/04 0412) Last BM Date: 01/21/15  Intake/Output from previous day: 01/03 0701 - 01/04 0700 In: 3439.5 [P.O.:1120; I.V.:1869.5; IV Piggyback:450] Out: 2970 [Urine:2470; Stool:500] Intake/Output this shift: Total I/O In: 85 [I.V.:85] Out: -   PE: Abd: soft, obese, +BS, colostomy with good air and feculent output, stoma is pink and viable.  Midline wound is clean and packed  Lab Results:   Recent Labs  01/19/15 0445 01/20/15 0400  WBC 29.9* 27.8*  HGB 13.7 13.7  HCT 39.1 40.1  PLT 251 300   BMET  Recent Labs  01/20/15 0400 01/21/15 0500  NA 140 135  K 3.6 4.5  CL 102 97*  CO2 29 29  GLUCOSE 156* 119*  BUN 40* 24*  CREATININE 1.31* 1.00  CALCIUM 7.6* 7.6*   PT/INR No results for input(s): LABPROT, INR in the last 72 hours. CMP     Component Value Date/Time   NA 135 01/21/2015 0500   K 4.5 01/21/2015 0500   CL 97* 01/21/2015 0500   CO2 29 01/21/2015 0500   GLUCOSE 119* 01/21/2015 0500   BUN 24* 01/21/2015 0500   CREATININE 1.00 01/21/2015 0500   CALCIUM 7.6* 01/21/2015 0500   PROT 4.9* 01/19/2015 0445   ALBUMIN 1.4* 01/19/2015 0445   AST 47* 01/19/2015 0445   ALT 26 01/19/2015 0445   ALKPHOS 67 01/19/2015 0445   BILITOT 1.1 01/19/2015 0445   GFRNONAA >60 01/21/2015 0500   GFRAA >60 01/21/2015 0500   Lipase     Component Value Date/Time   LIPASE 18 01/14/2015 0906        Studies/Results: No results found.  Anti-infectives: Anti-infectives    Start     Dose/Rate Route Frequency Ordered Stop   01/18/15 0400  levofloxacin (LEVAQUIN) IVPB 750 mg     750 mg 100 mL/hr over 90 Minutes Intravenous Every 24 hours 01/18/15 0210     01/18/15 0215  meropenem (MERREM) 1 g in sodium chloride 0.9 % 100 mL IVPB     1 g 200 mL/hr over 30 Minutes Intravenous 3 times per day 01/18/15 0210     01/16/15 0930  ertapenem (INVANZ) 1 g in sodium chloride 0.9 % 50 mL IVPB  Status:  Discontinued     1 g 100 mL/hr over 30 Minutes Intravenous Every 24 hours 01/16/15 0841 01/18/15 0203   01/14/15 1245  piperacillin-tazobactam (ZOSYN) IVPB 3.375 g  Status:  Discontinued     3.375 g 12.5 mL/hr over 240 Minutes Intravenous 3 times per day 01/14/15 1238 01/16/15 0841   01/14/15 1230  ciprofloxacin (CIPRO) IVPB 400 mg  Status:  Discontinued     400 mg 200 mL/hr over 60 Minutes Intravenous  Once 01/14/15 1218 01/14/15 1238   01/14/15 1230  metroNIDAZOLE (FLAGYL) IVPB 500 mg  Status:  Discontinued     500 mg 100 mL/hr over  60 Minutes Intravenous  Once 01/14/15 1218 01/14/15 1238       Assessment/Plan  POD 4, 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  Cr has essentially returned to baseline and he has good UOP. -cont abx therapy.  WBC down to 27K from 29K, cont to follow and observce -stable surgically for transfer from ICU DVT proph Scds/heparin drip  LOS: 7 days    Kaysey Berndt E 01/21/2015, 9:21 AM Pager: [phone]

## 2015-01-21 NOTE — Progress Notes (Signed)
ANTICOAGULATION CONSULT NOTE - Follow Up Consult  Pharmacy Consult for heparin and coumadin  Indication: Afib  Allergies  Allergen Reactions  . Contrast Media [Iodinated Diagnostic Agents]     a diffuse macular rash after CTA chest     Patient Measurements: Height: 5\' 11"  (180.3 cm) Weight: (!) 312 lb 13.3 oz (141.9 kg) IBW/kg (Calculated) : 75.3 Heparin Dosing Weight: 105 kg  Vital Signs: Temp: 97.8 F (36.6 C) (01/04 1502) Temp Source: Oral (01/04 1502) BP: 97/64 mmHg (01/04 1700) Pulse Rate: 98 (01/04 1700)  Labs:  Recent Labs  01/19/15 0445 01/20/15 0400 01/20/15 2111 01/21/15 0500 01/21/15 1330  HGB 13.7 13.7  --   --   --   HCT 39.1 40.1  --   --   --   PLT 251 300  --   --   --   HEPARINUNFRC  --   --  <0.10* <0.10* <0.10*  CREATININE 1.80* 1.31*  --  1.00  --     Estimated Creatinine Clearance: 131.6 mL/min (by C-G formula based on Cr of 1).   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 He was on chronic pradaxa PTA for afib but this has been on hold. Maintained on heparin gtt but now to restart on coumadin per Dr. Rayann Heman because of compliance issues. CBC stable, no s/s of bleed.  Goal of Therapy:  INR 2-3 Heparin level 0.3-0.7 units/ml Monitor platelets by anticoagulation protocol: Yes   Plan:  Give coumadin 10mg  PO x 1 tonight Continue heparin gtt at 2,400 units;/hr Check HL at 2330 tonight Monitor daily HL / INR, CBC, s/s of bleed  Elenor Quinones, PharmD, [facility] Clinical Pharmacist Pager [phone] 01/21/2015 5:43 PM

## 2015-01-21 NOTE — Progress Notes (Signed)
PULMONARY / CRITICAL CARE MEDICINE   Name: William Fischer MRN: [id number] DOB: May 14, 1965    ADMISSION DATE:  01/14/2015 CONSULTATION DATE:  01/17/2015  REFERRING MD:  Dr. Evette Doffing  CHIEF COMPLAINT:  Abdominal pain  SUBJECTIVE:  Tolerating diet.  Denies chest pain.  VITAL SIGNS: BP 105/72 mmHg  Pulse 102  Temp(Src) 98.2 F (36.8 C) (Oral)  Resp 29  Ht 5\' 11"  (1.803 m)  Wt 312 lb 13.3 oz (141.9 kg)  BMI 43.65 kg/m2  SpO2 90%  INTAKE / OUTPUT: I/O last 3 completed shifts: In: 4769.5 [P.O.:1320; I.V.:2649.5; IV Piggyback:800] Out: 4045 [id number]; Stool:500]  PHYSICAL EXAMINATION: General:  Alert Neuro:  Follows commands, normal strength HEENT: no sinus tenderness Cardiovascular:  Irregular, tachycardic Lungs: no wheeze Abdomen:  Soft, colostomy in place Musculoskeletal:  1+ edema Skin:  No rashes  LABS:  BMET  Recent Labs Lab 01/19/15 0445 01/20/15 0400 01/21/15 0500  NA 139 140 135  K 3.5 3.6 4.5  CL 104 102 97*  CO2 26 29 29   BUN 52* 40* 24*  CREATININE 1.80* 1.31* 1.00  GLUCOSE 166* 156* 119*    Electrolytes  Recent Labs Lab 01/18/15 0030 01/19/15 0445 01/20/15 0400 01/21/15 0500  CALCIUM 7.1* 7.5* 7.6* 7.6*  MG 1.4* 2.2  --   --   PHOS  --  3.9  --   --     CBC  Recent Labs Lab 01/17/15 1924 01/19/15 0445 01/20/15 0400  WBC 23.4* 29.9* 27.8*  HGB 15.1 13.7 13.7  HCT 43.8 39.1 40.1  PLT 224 251 300    Coag's No results for input(s): APTT, INR in the last 168 hours.  Sepsis Markers  Recent Labs Lab 01/14/15 1237  LATICACIDVEN 1.39    ABG  Recent Labs Lab 01/17/15 1642 01/17/15 1715 01/17/15 1920  PHART 7.383 7.364 7.386  PCO2ART 50.7* 47.9* 48.8*  PO2ART 113.0* 73.0* 90.0    Liver Enzymes  Recent Labs Lab 01/19/15 0445  AST 47*  ALT 26  ALKPHOS 67  BILITOT 1.1  ALBUMIN 1.4*    Cardiac Enzymes No results for input(s): TROPONINI, PROBNP in the last 168 hours.  Glucose  Recent Labs Lab  01/20/15 0731 01/20/15 1205 01/20/15 1528 01/20/15 1929 01/20/15 2351 01/21/15 0400  GLUCAP 132* 194* 198* 138* 136* 129*    Imaging No results found.   STUDIES:  12/28 CT abd/pelvis >> perforated sigmoid diverticulitis 12/30 Echo >> EF 45 to 50%, mild MR, mod LA dilation 12/31 CT abd/pelvis >> free air, Lt pleural effusion, Lt base consolidation, basilar ATX  CULTURES: 12/28 Urine >> negative 01/01 Blood >>   ANTIBIOTICS: 12/28 Zosyn >> 12/30 12/30 Levaquin >> 12/30 Meropenem >>  SIGNIFICANT EVENTS: 12/28 Admit, surgery consulted 12/30 Cardiology consulted 12/31 Free air >> to OR for Lt colectomy, end colostomy 01/01 Extubated, weaned off pressors 01/03 Started heparin gtt  LINES/TUBES: 12/31 Rt IJ CVL >> 12/31 ETT >> 01/01  DISCUSSION: 50 yo male presented with LLL abdominal pain from perforated sigmoid colon.  Hx of A fib on pradaxa.  He developed free air and taken to OR 12/31.  ASSESSMENT / PLAN:  PULMONARY A: Acute respiratory failure 2nd to peritonitis, possible aspiration, and pleural effusions. P:   Oxygen to keep SpO2 > 92% Bronchial hygiene F/u CXR intermittently  CARDIOVASCULAR A:  Sepsis from peritonitis >> resolved. A fib with RVR. Acute systolic CHF. P:  Lopressor 5 mg IV q6h until able to take oral medications KVO IV fluids Continue cardizem gtt  per cardiology Heparin gtt per cardiology  RENAL A:   AKI >> baseline creatinine 0.92 from May 2016. P:   Monitor renal fx, urine outpt  GASTROINTESTINAL A:   Perforated sigmoid diverticulitis s/p laparotomy 12/31. P:   Post-op care, nutrition, per surgery Protonix for SUP  HEMATOLOGIC A:   Leukocytosis. P:  F/u CBC intermittently SQ heparin for DVT prophylaxis  INFECTIOUS A:   Peritonitis. Aspiration LLL. P:   Day 8/10 of abx, currently on meropenem and levaquin  ENDOCRINE A:   Hyperglycemia. P:   SSI  NEUROLOGIC A:   Acute encephalopathy 2nd to sepsis >>  resolved. P:   Monitor mental status  Disposition >> hopefully can transition off cardizem gtt later today, and then transfer to SDU.  Chesley Mires, MD [facility] Pulmonary/Critical Care 01/21/2015, 9:29 AM Pager:  [phone] After 3pm call: [phone]

## 2015-01-21 NOTE — Progress Notes (Signed)
PT Profile  50 y.o. male with a past medical history significant for longstanding persistent atrial fibrillation, previous tachycardia induced cardiomyopathy, depression, and obesity. He has been followed by Dr Rayann Heman for EP care and Truitt Merle for cardiology. He failed TEE/DCCV in 11/2011. He was then placed on Sotalol and repeat DCCV was also unsuccessful. With obesity and severe LA enlargement, he was not felt to be a candidate for PVI and rate control was recommended. He has done relatively well from an AF standpoint but has declined OSA evaluation in the past and also has not taken ACE-I as recommended- admitted with abd pain underwent EXPLORATORY LAPAROTOMY WITH LEFT COLECTOMY AND COLOSTOMY 01/17/15.  Was on Vent. Now extubated.  We were asked to see for tachycardia and management.  EP signed off yesterday.  Currently on metoprolol 5 mg IV every 6 hrs,   Dilt drip at.  HR controlled at low 100s  Subjective: Now with full liquids, doing well and   Objective: Vital signs in last 24 hours: Temp:  [97.7 F (36.5 C)-98.5 F (36.9 C)] 97.8 F (36.6 C) (01/04 1502) Pulse Rate:  [94-123] 107 (01/04 1600) Resp:  [22-34] 28 (01/04 1600) BP: (98-126)/(62-89) 116/64 mmHg (01/04 1600) SpO2:  [89 %-96 %] 92 % (01/04 1600) Weight:  [312 lb 13.3 oz (141.9 kg)] 312 lb 13.3 oz (141.9 kg) (01/04 0412) Weight change: 9 lb 13.3 oz (4.46 kg) Last BM Date: 01/21/15 Intake/Output from previous day: +474 (total since admit +3729) 01/03 0701 - 01/04 0700 In: 3439.5 [P.O.:1120; I.V.:1869.5; IV Piggyback:450] Out: 2970 [Urine:2470; Stool:500] Intake/Output this shift: Total I/O In: 757 [P.O.:240; I.V.:417; IV Piggyback:100] Out: 1175 [Urine:1000; Stool:175]  PE: General:Pleasant affect, NAD Skin:Warm and dry, brisk capillary refill HEENT:normocephalic, sclera clear, mucus membranes moist Heart:irreg irreg without murmur, gallup, rub or click Lungs: diminished in bases without rales, +  rhonchi, occ wheezes Abd:, + surgical tenderness, + BS, do not palpate liver spleen or masses- colostomy bag full Ext:no lower ext edema,  2+ radial pulses Neuro:alert and oriented x 3, MAE, follows commands, + facial symmetry Tele: a fib rate controlled   Lab Results:  Recent Labs  01/19/15 0445 01/20/15 0400  WBC 29.9* 27.8*  HGB 13.7 13.7  HCT 39.1 40.1  PLT 251 300   BMET  Recent Labs  01/20/15 0400 01/21/15 0500  NA 140 135  K 3.6 4.5  CL 102 97*  CO2 29 29  GLUCOSE 156* 119*  BUN 40* 24*  CREATININE 1.31* 1.00  CALCIUM 7.6* 7.6*   No results for input(s): TROPONINI in the last 72 hours.  Invalid input(s): CK, MB  Lab Results  Component Value Date   CHOL 149 07/16/2013   HDL 46.20 07/16/2013   LDLCALC 87 07/16/2013   TRIG 156* 01/17/2015   CHOLHDL 3 07/16/2013   Lab Results  Component Value Date   HGBA1C 6.1* 01/15/2015     Lab Results  Component Value Date   TSH 2.14 07/16/2013    Hepatic Function Panel  Recent Labs  01/19/15 0445  PROT 4.9*  ALBUMIN 1.4*  AST 47*  ALT 26  ALKPHOS 67  BILITOT 1.1   No results for input(s): CHOL in the last 72 hours. No results for input(s): PROTIME in the last 72 hours.     Studies/Results: ECHO 01/16/15 Study Conclusions  - Left ventricle: EF hard to judge due to rapid afib. Poor image quality abnormal septal motion The cavity size was normal. Wall thickness was  normal. Systolic function was mildly reduced. The estimated ejection fraction was in the range of 45% to 50%. - Mitral valve: There was mild regurgitation. - Left atrium: The atrium was moderately dilated. - Atrial septum: No defect or patent foramen ovale was identified. - Pericardium, extracardiac: Small lateral pericardial effusion - Impressions: Consider TEE if clinically indicated. ? lipomatous hypertrophy of atrial septum cannot r/o mass in RA but suspect this is off axis artifact.  Impressions:  - Consider TEE if  clinically indicated. ? lipomatous hypertrophy of atrial septum cannot r/o mass in RA but suspect this is off axis artifact.   Medications: I have reviewed the patient's current medications. Scheduled Meds: . insulin aspart  0-15 Units Subcutaneous 6 times per day  . meropenem (MERREM) IV  1 g Intravenous 3 times per day  . metoprolol  5 mg Intravenous 4 times per day  . pantoprazole (PROTONIX) IV  40 mg Intravenous Q24H   Continuous Infusions: . sodium chloride 10 mL/hr at 01/21/15 0933  . diltiazem (CARDIZEM) infusion 15 mg/hr (01/21/15 1207)  . heparin 2,400 Units/hr (01/21/15 1522)   PRN Meds:.fentaNYL (SUBLIMAZE) injection, [DISCONTINUED] ondansetron **OR** ondansetron (ZOFRAN) IV  Assessment/Plan: Active Problems:   Diverticulitis of colon with perforation S/P expl lap and LEFT COLECTOMY AND COLOSTOMY 01/17/15--POD #4    Hypoxia- extubated 01/18/15 stable- resp rate 27 to 28    Atrial fibrillation with rapid ventricular response ([place])- permanent a fib, now rate controlled on IV meds. Pt taking po full liquids, change to po meds or wait another day? --IV heparin started yesterday  Was on Pradaxa prior to admit but Dr. Rayann Heman mentions coumadin instead he worried about compliance.   --change from dilt and metoprolol to coreg and add ace per Dr. Rayann Heman today vs. tomorrow    Systolic dysfunction with acute on chronic heart failure ([place]) still + I&O  EF 45-50%  --last lasix 01/19/15  AKI with acute illness now Cr at 1.00    LOS: 7 days   Time spent with pt. :15 minutes. [facility] R  Nurse Practitioner Certified Pager [phone] or after 5pm and on weekends call [phone] 01/21/2015, 4:46 PM

## 2015-01-22 DIAGNOSIS — K631 Perforation of intestine (nontraumatic): Secondary | ICD-10-CM

## 2015-01-22 DIAGNOSIS — J9690 Respiratory failure, unspecified, unspecified whether with hypoxia or hypercapnia: Secondary | ICD-10-CM | POA: Insufficient documentation

## 2015-01-22 DIAGNOSIS — J9600 Acute respiratory failure, unspecified whether with hypoxia or hypercapnia: Secondary | ICD-10-CM

## 2015-01-22 LAB — GLUCOSE, CAPILLARY
Glucose-Capillary: 101 mg/dL — ABNORMAL HIGH (ref 65–99)
Glucose-Capillary: 102 mg/dL — ABNORMAL HIGH (ref 65–99)

## 2015-01-22 LAB — CBC
HEMATOCRIT: 35.3 % — AB (ref 39.0–52.0)
HEMOGLOBIN: 11.8 g/dL — AB (ref 13.0–17.0)
MCH: 31 pg (ref 26.0–34.0)
MCHC: 33.4 g/dL (ref 30.0–36.0)
MCV: 92.7 fL (ref 78.0–100.0)
Platelets: 328 10*3/uL (ref 150–400)
RBC: 3.81 MIL/uL — AB (ref 4.22–5.81)
RDW: 15.8 % — ABNORMAL HIGH (ref 11.5–15.5)
WBC: 20.2 10*3/uL — ABNORMAL HIGH (ref 4.0–10.5)

## 2015-01-22 LAB — BASIC METABOLIC PANEL
Anion gap: 3 — ABNORMAL LOW (ref 5–15)
BUN: 16 mg/dL (ref 6–20)
CHLORIDE: 105 mmol/L (ref 101–111)
CO2: 31 mmol/L (ref 22–32)
Calcium: 6.9 mg/dL — ABNORMAL LOW (ref 8.9–10.3)
Creatinine, Ser: 0.71 mg/dL (ref 0.61–1.24)
GFR calc Af Amer: >60 mL/min (ref >60)
GFR calc non Af Amer: >60 mL/min (ref >60)
Glucose, Bld: 110 mg/dL — ABNORMAL HIGH (ref 65–99)
POTASSIUM: 3.4 mmol/L — AB (ref 3.5–5.1)
SODIUM: 139 mmol/L (ref 135–145)

## 2015-01-22 LAB — HEPARIN LEVEL (UNFRACTIONATED)
HEPARIN UNFRACTIONATED: 0.12 [IU]/mL — AB (ref 0.30–0.70)
HEPARIN UNFRACTIONATED: 0.13 [IU]/mL — AB (ref 0.30–0.70)

## 2015-01-22 LAB — PROTIME-INR
INR: 1.89 — ABNORMAL HIGH (ref 0.00–1.49)
PROTHROMBIN TIME: 21.6 s — AB (ref 11.6–15.2)

## 2015-01-22 MED ORDER — FUROSEMIDE 10 MG/ML IJ SOLN
40.0000 mg | Freq: Once | INTRAMUSCULAR | Status: AC
  Administered 2015-01-22: 40 mg via INTRAVENOUS
  Filled 2015-01-22: qty 4

## 2015-01-22 MED ORDER — PANTOPRAZOLE SODIUM 40 MG PO TBEC
40.0000 mg | DELAYED_RELEASE_TABLET | Freq: Every day | ORAL | Status: DC
  Administered 2015-01-22 – 2015-02-02 (×12): 40 mg via ORAL
  Filled 2015-01-22 (×12): qty 1

## 2015-01-22 MED ORDER — WARFARIN SODIUM 5 MG PO TABS
5.0000 mg | ORAL_TABLET | Freq: Once | ORAL | Status: AC
  Administered 2015-01-22: 5 mg via ORAL
  Filled 2015-01-22: qty 1

## 2015-01-22 MED ORDER — POTASSIUM CHLORIDE 10 MEQ/50ML IV SOLN
10.0000 meq | INTRAVENOUS | Status: AC
  Administered 2015-01-22 (×2): 10 meq via INTRAVENOUS
  Filled 2015-01-22 (×2): qty 50

## 2015-01-22 MED ORDER — POTASSIUM CHLORIDE 20 MEQ/15ML (10%) PO SOLN
40.0000 meq | Freq: Once | ORAL | Status: AC
  Administered 2015-01-22: 40 meq via ORAL
  Filled 2015-01-22: qty 30

## 2015-01-22 MED ORDER — CARVEDILOL 25 MG PO TABS
25.0000 mg | ORAL_TABLET | Freq: Two times a day (BID) | ORAL | Status: DC
  Administered 2015-01-22 (×2): 25 mg via ORAL
  Filled 2015-01-22 (×2): qty 1

## 2015-01-22 MED ORDER — LEVALBUTEROL HCL 0.63 MG/3ML IN NEBU
0.6300 mg | INHALATION_SOLUTION | RESPIRATORY_TRACT | Status: DC | PRN
  Administered 2015-01-25: 0.63 mg via RESPIRATORY_TRACT
  Filled 2015-01-22: qty 3

## 2015-01-22 MED ORDER — IPRATROPIUM BROMIDE 0.02 % IN SOLN: 0.5000 mg | RESPIRATORY_TRACT | Status: DC | PRN

## 2015-01-22 NOTE — Progress Notes (Signed)
ANTICOAGULATION CONSULT NOTE - Follow Up Consult  Pharmacy Consult for heparin and coumadin  Indication: Afib  Allergies  Allergen Reactions  . Contrast Media [Iodinated Diagnostic Agents]     a diffuse macular rash after CTA chest     Patient Measurements: Height: 5\' 11"  (180.3 cm) Weight: (!) 312 lb 13.3 oz (141.9 kg) IBW/kg (Calculated) : 75.3 Heparin Dosing Weight: 105 kg  Vital Signs: Temp: 98 F (36.7 C) (01/05 1553) Temp Source: Oral (01/05 1553) BP: 110/77 mmHg (01/05 1800) Pulse Rate: 104 (01/05 1800)  Labs:  Recent Labs  01/20/15 0400  01/21/15 0500  01/22/15 0355 01/22/15 0750 01/22/15 1645  HGB 13.7  --   --   --  11.8*  --   --   HCT 40.1  --   --   --  35.3*  --   --   PLT 300  --   --   --  328  --   --   LABPROT  --   --   --   --  21.6*  --   --   INR  --   --   --   --  1.89*  --   --   HEPARINUNFRC  --   < > <0.10*  < > <0.10* 0.12* 0.13*  CREATININE 1.31*  --  1.00  --  0.71  --   --   < > = values in this interval not displayed.  Estimated Creatinine Clearance: 164.5 mL/min (by C-G formula based on Cr of 0.71).   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 He was on chronic pradaxa PTA for afib but this has been on hold. He continues on heparin IV and warfarin.   HL still low tonight at 0.13 after dose increase to 3000 units/hr. Will increase rate again and recheck coags in am. May need to consider lovenox if parenteral anticoagulation is to continue much longer as he is not responding well to heparin.  Goal of Therapy:  INR 2-3 Heparin level 0.3-0.7 units/ml Monitor platelets by anticoagulation protocol: Yes   Plan:  - Increase heparin gtt to 3300 units/hr - Check an 8 hour heparin level - Daily INR, heparin level and CBC  Erin Hearing PharmD., BCPS Clinical Pharmacist Pager [phone] 01/22/2015 6:34 PM

## 2015-01-22 NOTE — Progress Notes (Signed)
[facility] ADULT ICU REPLACEMENT PROTOCOL FOR AM LAB REPLACEMENT ONLY  The patient does apply for the [facility] Adult ICU Electrolyte Replacment Protocol based on the criteria listed below:   1. Is GFR >/= 40 ml/min? Yes.    Patient's GFR today is >60 2. Is urine output >/= 0.5 ml/kg/hr for the last 6 hours? Yes.   Patient's UOP is 1.4 ml/kg/hr 3. Is BUN < 60 mg/dL? Yes.    Patient's BUN today is 16 4. Abnormal electrolyte(s): K3.4 5. Ordered repletion with: per protocol 6. If a panic level lab has been reported, has the CCM MD in charge been notified? Yes.  .   Physician:  Bryn Gulling, MD   [facility] 01/22/2015 5:38 AM

## 2015-01-22 NOTE — Progress Notes (Signed)
PULMONARY / CRITICAL CARE MEDICINE   Name: William Fischer MRN: [id number] DOB: 07-Aug-1965    ADMISSION DATE:  01/14/2015 CONSULTATION DATE:  01/17/2015  REFERRING MD:  Dr. Evette Doffing  CHIEF COMPLAINT:  Abdominal pain  SUBJECTIVE:  Increased O2 needs last night >> better after getting lasix.  VITAL SIGNS: BP 100/77 mmHg  Pulse 91  Temp(Src) 97.6 F (36.4 C) (Oral)  Resp 28  Ht 5\' 11"  (1.803 m)  Wt 312 lb 13.3 oz (141.9 kg)  BMI 43.65 kg/m2  SpO2 95%  INTAKE / OUTPUT: I/O last 3 completed shifts: In: 3529.8 [P.O.:780; I.V.:2049.8; IV Piggyback:700] Out: [id number] [Urine:4165; Stool:525]  PHYSICAL EXAMINATION: General:  Alert Neuro:  Follows commands, normal strength HEENT: no sinus tenderness Cardiovascular:  Irregular Lungs: basilar rales Lt > Rt Abdomen:  Soft, colostomy in place Musculoskeletal:  1+ edema Skin:  No rashes  LABS:  BMET  Recent Labs Lab 01/20/15 0400 01/21/15 0500 01/22/15 0355  NA 140 135 139  K 3.6 4.5 3.4*  CL 102 97* 105  CO2 29 29 31   BUN 40* 24* 16  CREATININE 1.31* 1.00 0.71  GLUCOSE 156* 119* 110*    Electrolytes  Recent Labs Lab 01/18/15 0030 01/19/15 0445 01/20/15 0400 01/21/15 0500 01/22/15 0355  CALCIUM 7.1* 7.5* 7.6* 7.6* 6.9*  MG 1.4* 2.2  --   --   --   PHOS  --  3.9  --   --   --     CBC  Recent Labs Lab 01/19/15 0445 01/20/15 0400 01/22/15 0355  WBC 29.9* 27.8* 20.2*  HGB 13.7 13.7 11.8*  HCT 39.1 40.1 35.3*  PLT 251 300 328    Coag's  Recent Labs Lab 01/22/15 0355  INR 1.89*    Sepsis Markers No results for input(s): LATICACIDVEN, PROCALCITON, O2SATVEN in the last 168 hours.  ABG  Recent Labs Lab 01/17/15 1642 01/17/15 1715 01/17/15 1920  PHART 7.383 7.364 7.386  PCO2ART 50.7* 47.9* 48.8*  PO2ART 113.0* 73.0* 90.0    Liver Enzymes  Recent Labs Lab 01/19/15 0445  AST 47*  ALT 26  ALKPHOS 67  BILITOT 1.1  ALBUMIN 1.4*    Cardiac Enzymes No results for input(s): TROPONINI,  PROBNP in the last 168 hours.  Glucose  Recent Labs Lab 01/21/15 0810 01/21/15 1128 01/21/15 1917 01/21/15 2332 01/22/15 0333 01/22/15 0732  GLUCAP 118* 125* 207* 135* 101* 102*    Imaging Dg Chest Port 1 View  01/22/2015  CLINICAL DATA:  Dyspnea. EXAM: PORTABLE CHEST 1 VIEW COMPARISON:  01/19/2015 FINDINGS: Enteric tube is been removed. Tip of the right central line in the region of the proximal SVC. Low lung volumes again seen. Progressive bibasilar opacities, likely combination of pleural effusion and consolidation. Heart appears prominent in size. Vascular congestion suspected, not well assessed due to low lung volumes. IMPRESSION: Hypoventilatory chest with progressive bibasilar opacities, likely combination of pleural effusion and consolidation. Consolidation may be related to atelectasis, aspiration, or pneumonia. Electronically Signed   By: Jeb Levering M.D.   On: 01/22/2015 00:02     STUDIES:  12/28 CT abd/pelvis >> perforated sigmoid diverticulitis 12/30 Echo >> EF 45 to 50%, mild MR, mod LA dilation 12/31 CT abd/pelvis >> free air, Lt pleural effusion, Lt base consolidation, basilar ATX  CULTURES: 12/28 Urine >> negative 01/01 Blood >>   ANTIBIOTICS: 12/28 Zosyn >> 12/30 12/30 Levaquin >> 1/03 12/30 Meropenem >>  SIGNIFICANT EVENTS: 12/28 Admit, surgery consulted 12/30 Cardiology consulted 12/31 Free air >> to OR  for Lt colectomy, end colostomy 01/01 Extubated, weaned off pressors 01/03 Started heparin gtt 01/04 off cardizem gtt 01/05 Increased O2 needs >> better with diuresis  LINES/TUBES: 12/31 Rt IJ CVL >> 12/31 ETT >> 01/01  DISCUSSION: 50 yo male presented with LLL abdominal pain from perforated sigmoid colon.  Hx of A fib on pradaxa.  He developed free air and taken to OR 12/31.  ASSESSMENT / PLAN:  PULMONARY A: Acute respiratory failure 2nd to peritonitis, possible aspiration, and pleural effusions. P:   Oxygen to keep SpO2 >  92% Bronchial hygiene F/u CXR 1/05 PRN BD's  CARDIOVASCULAR A:  Sepsis from peritonitis >> resolved. A fib with RVR. Acute systolic CHF. P:  Continue coreg Lasix 40 mg IV x one on 1/04 Heparin gtt with transition to coumadin per cardiology  RENAL A:   AKI, resolved >> baseline creatinine 0.92 from May 2016. P:   Monitor renal fx, urine outpt  GASTROINTESTINAL A:   Perforated sigmoid diverticulitis s/p laparotomy 12/31. P:   Post-op care, nutrition, per surgery Protonix for SUP >> likely can d/c soon  HEMATOLOGIC A:   Leukocytosis >> improving. P:  F/u CBC intermittently SQ heparin for DVT prophylaxis  INFECTIOUS A:   Peritonitis. Aspiration LLL. P:   Day 9/10 of abx, currently on meropenem and levaquin  ENDOCRINE A:   Hyperglycemia >> improved. P:   D/c SSI  NEUROLOGIC A:   Acute encephalopathy 2nd to sepsis >> resolved. Deconditioning. P:   Monitor mental status PT assessment  Disposition >> transfer to SDU.  Will ask IMTS to resume care from 1/06 and PCCM off.  Chesley Mires, MD [facility] Pulmonary/Critical Care 01/22/2015, 8:03 AM Pager:  [phone] After 3pm call: [phone]

## 2015-01-22 NOTE — Progress Notes (Signed)
PT Profile  50 y.o. male with a past medical history significant for longstanding persistent atrial fibrillation, previous tachycardia induced cardiomyopathy, depression, and obesity. He has been followed by Dr Rayann Heman for EP care and Truitt Merle for cardiology. He failed TEE/DCCV in 11/2011. He was then placed on Sotalol and repeat DCCV was also unsuccessful. With obesity and severe LA enlargement, he was not felt to be a candidate for PVI and rate control was recommended. He has done relatively well from an AF standpoint but has declined OSA evaluation in the past and also has not taken ACE-I as recommended- admitted with abd pain underwent EXPLORATORY LAPAROTOMY WITH LEFT COLECTOMY AND COLOSTOMY 01/17/15.  Was on Vent. Now extubated.  We were asked to see for tachycardia and management.  EP signed off yesterday.  Currently on metoprolol 5 mg IV every 6 hrs,   Dilt drip at.  HR controlled at low 100s  Subjective: Weaned off of cardizem overnight. Now on carvedilol with rates in the 90's.   Objective: Vital signs in last 24 hours: Temp:  [97.6 F (36.4 C)-98.2 F (36.8 C)] 97.6 F (36.4 C) (01/05 0734) Pulse Rate:  [58-123] 92 (01/05 0800) Resp:  [24-35] 33 (01/05 0800) BP: (89-126)/(54-89) 115/73 mmHg (01/05 0800) SpO2:  [87 %-96 %] 96 % (01/05 0800) FiO2 (%):  [50 %] 50 % (01/05 0800) Weight change:  Last BM Date: 01/21/15 Intake/Output from previous day: +474 (total since admit +3729) 01/04 0701 - 01/05 0700 In: 2137.6 [P.O.:720; I.V.:1067.6; IV Piggyback:350] Out: [id number] [Urine:3015; Stool:375] Intake/Output this shift: Total I/O In: 48 [I.V.:48] Out: 300 [Urine:250; Stool:50]  PE: General:Pleasant affect, NAD Skin:Warm and dry, brisk capillary refill HEENT:normocephalic, sclera clear, mucus membranes moist Heart:irreg irreg without murmur, gallup, rub or click Lungs: diminished in bases without rales, + rhonchi, occ wheezes Abd:, + surgical tenderness, + BS, do  not palpate liver spleen or masses- colostomy bag full Ext:no lower ext edema,  2+ radial pulses Neuro:alert and oriented x 3, MAE, follows commands, + facial symmetry Tele: a fib rate controlled   Lab Results:  Recent Labs  01/20/15 0400 01/22/15 0355  WBC 27.8* 20.2*  HGB 13.7 11.8*  HCT 40.1 35.3*  PLT 300 328   BMET  Recent Labs  01/21/15 0500 01/22/15 0355  NA 135 139  K 4.5 3.4*  CL 97* 105  CO2 29 31  GLUCOSE 119* 110*  BUN 24* 16  CREATININE 1.00 0.71  CALCIUM 7.6* 6.9*   No results for input(s): TROPONINI in the last 72 hours.  Invalid input(s): CK, MB  Lab Results  Component Value Date   CHOL 149 07/16/2013   HDL 46.20 07/16/2013   LDLCALC 87 07/16/2013   TRIG 156* 01/17/2015   CHOLHDL 3 07/16/2013   Lab Results  Component Value Date   HGBA1C 6.1* 01/15/2015     Lab Results  Component Value Date   TSH 2.14 07/16/2013    Hepatic Function Panel No results for input(s): PROT, ALBUMIN, AST, ALT, ALKPHOS, BILITOT, BILIDIR, IBILI in the last 72 hours. No results for input(s): CHOL in the last 72 hours. No results for input(s): PROTIME in the last 72 hours.  Studies/Results: ECHO 01/16/15 Study Conclusions  - Left ventricle: EF hard to judge due to rapid afib. Poor image quality abnormal septal motion The cavity size was normal. Wall thickness was normal. Systolic function was mildly reduced. The estimated ejection fraction was in the range of 45% to 50%. - Mitral valve:  There was mild regurgitation. - Left atrium: The atrium was moderately dilated. - Atrial septum: No defect or patent foramen ovale was identified. - Pericardium, extracardiac: Small lateral pericardial effusion - Impressions: Consider TEE if clinically indicated. ? lipomatous hypertrophy of atrial septum cannot r/o mass in RA but suspect this is off axis artifact.  Impressions:  - Consider TEE if clinically indicated. ? lipomatous hypertrophy of atrial septum  cannot r/o mass in RA but suspect this is off axis artifact.   Medications: I have reviewed the patient's current medications. Scheduled Meds: . antiseptic oral rinse  7 mL Mouth Rinse BID  . carvedilol  25 mg Oral BID WC  . furosemide  40 mg Intravenous Once  . meropenem (MERREM) IV  1 g Intravenous 3 times per day  . pantoprazole  40 mg Oral Q1200  . potassium chloride  10 mEq Intravenous Q1 Hr x 2  . potassium chloride  40 mEq Oral Once  . Warfarin - Pharmacist Dosing Inpatient   Does not apply q1800   Continuous Infusions: . sodium chloride 10 mL/hr at 01/22/15 0800  . heparin 2,800 Units/hr (01/22/15 0800)   PRN Meds:.fentaNYL (SUBLIMAZE) injection, ipratropium, levalbuterol, [DISCONTINUED] ondansetron **OR** ondansetron (ZOFRAN) IV  Assessment/Plan: Active Problems:   Diverticulitis of colon with perforation S/P expl lap and LEFT COLECTOMY AND COLOSTOMY 01/17/15--POD #4    Hypoxia- extubated 01/18/15 stable- resp rate 27 to 28    Atrial fibrillation with rapid ventricular response ([place])- permanent a fib, now rate controlled on po carvedilol. --IV Heparin with warfarin overlap per pharmacy (INR 123456 today)    Systolic dysfunction with acute on chronic heart failure ([place]) - still + I&O  EF 45-50%  - lasix yesterday, 1.2L negative. Give additional lasix today. - would start low dose ACE-I or ARB prior to discharge.  - AKI with acute illness - now Cr at baseline  - Hypokalemia - replete.  Pixie Casino, MD, [facility] Attending Cardiologist [facility]   LOS: 8 days  Pixie Casino  01/22/2015, 8:16 AM

## 2015-01-22 NOTE — Consult Note (Addendum)
WOC ostomy consult note CCS following for assessment and plan of care to abd wound. Reason for Consult: Pouch change demonstration performed Stoma red and viable, 50% above skin level, 50% flush with skin level from 6:00 o'clock to 11:00 o'clock, 1 1/4 inches  Applied barrier ring and 2 piece pouching appliance to maintain seal. Mod amt brown stool in pouch Demonstrated pouch change using barrier ring and 2 piece pouching system. Pt watched and asked appropriate questions. Will continue teaching sessions when stable and out of ICU. Supplies ordered to bedside for staff nurse use and educational materials at bedside, no family members present but patient states his siter is a Marine scientist and will assist him after discharge. Julien Girt MSN, RN, Benjamin Perez, [place], [place]

## 2015-01-22 NOTE — Progress Notes (Signed)
ANTICOAGULATION CONSULT NOTE - Follow Up Consult  Pharmacy Consult for Heparin  Indication: atrial fibrillation  Allergies  Allergen Reactions  . Contrast Media [Iodinated Diagnostic Agents]     a diffuse macular rash after CTA chest    Patient Measurements: Height: 5\' 11"  (180.3 cm) Weight: (!) 312 lb 13.3 oz (141.9 kg) IBW/kg (Calculated) : 75.3  Vital Signs: Temp: 97.7 F (36.5 C) (01/05 0000) Temp Source: Oral (01/05 0000) BP: 89/58 mmHg (01/05 0000) Pulse Rate: 74 (01/05 0000)  Labs:  Recent Labs  01/19/15 0445 01/20/15 0400  01/21/15 0500 01/21/15 1330 01/21/15 2330  HGB 13.7 13.7  --   --   --   --   HCT 39.1 40.1  --   --   --   --   PLT 251 300  --   --   --   --   HEPARINUNFRC  --   --   < > <0.10* <0.10* <0.10*  CREATININE 1.80* 1.31*  --  1.00  --   --   < > = values in this interval not displayed.  Estimated Creatinine Clearance: 131.6 mL/min (by C-G formula based on Cr of 1).  Assessment: 50 y/o M on heparin/warfarin bridge for afib, HL remains low despite multiple rate increases, other labs reviewed, no issues per RN.   Goal of Therapy:  Heparin level 0.3-0.7 units/ml Monitor platelets by anticoagulation protocol: Yes   Plan:  -Increase heparin to 2800 units/hr -0800 HL  Jamisha Hoeschen 01/22/2015,12:45 AM

## 2015-01-22 NOTE — Progress Notes (Signed)
Pt  Encouraged to get up OOB; pt states he feels "too weak" to do so at this time; pt reassured that nursing staff will help pt get OOB; pt states he will not get OOB at this time; will cont. To monitor.  William Fischer

## 2015-01-22 NOTE — Progress Notes (Signed)
ANTICOAGULATION CONSULT NOTE - Follow Up Consult  Pharmacy Consult for heparin and coumadin  Indication: Afib  Allergies  Allergen Reactions  . Contrast Media [Iodinated Diagnostic Agents]     a diffuse macular rash after CTA chest     Patient Measurements: Height: 5\' 11"  (180.3 cm) Weight: (!) 312 lb 13.3 oz (141.9 kg) IBW/kg (Calculated) : 75.3 Heparin Dosing Weight: 105 kg  Vital Signs: Temp: 97.6 F (36.4 C) (01/05 0734) Temp Source: Oral (01/05 0734) BP: 115/73 mmHg (01/05 0800) Pulse Rate: 92 (01/05 0800)  Labs:  Recent Labs  01/20/15 0400  01/21/15 0500  01/21/15 2330 01/22/15 0355 01/22/15 0750  HGB 13.7  --   --   --   --  11.8*  --   HCT 40.1  --   --   --   --  35.3*  --   PLT 300  --   --   --   --  328  --   LABPROT  --   --   --   --   --  21.6*  --   INR  --   --   --   --   --  1.89*  --   HEPARINUNFRC  --   < > <0.10*  < > <0.10* <0.10* 0.12*  CREATININE 1.31*  --  1.00  --   --  0.71  --   < > = values in this interval not displayed.  Estimated Creatinine Clearance: 164.5 mL/min (by C-G formula based on Cr of 0.71).   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 He was on chronic pradaxa PTA for afib but this has been on hold. He continues on heparin IV and warfarin. Heparin level remains low despite aggressive dose increases. INR is 1.89 after one dose of warfarin. We do not have a baseline so it is unclear if the patient just had a high baseline INR or if their INR increased dramatically after a single dose of warfarin. No bleeding noted.   Goal of Therapy:  INR 2-3 Heparin level 0.3-0.7 units/ml Monitor platelets by anticoagulation protocol: Yes   Plan:  - Increase heparin gtt to 3000 units/hr - Check an 8 hour heparin level - Warfarin 5mg  PO x 1 tonight - Daily INR, heparin level and CBC  Salome Arnt, PharmD, BCPS Pager # [phone] 01/22/2015 8:37 AM

## 2015-01-23 ENCOUNTER — Inpatient Hospital Stay (HOSPITAL_COMMUNITY): Payer: Medicaid Other

## 2015-01-23 DIAGNOSIS — Z933 Colostomy status: Secondary | ICD-10-CM

## 2015-01-23 DIAGNOSIS — Z9049 Acquired absence of other specified parts of digestive tract: Secondary | ICD-10-CM

## 2015-01-23 DIAGNOSIS — Z7901 Long term (current) use of anticoagulants: Secondary | ICD-10-CM

## 2015-01-23 LAB — CBC
HCT: 41 % (ref 39.0–52.0)
Hemoglobin: 13.5 g/dL (ref 13.0–17.0)
MCH: 31.3 pg (ref 26.0–34.0)
MCHC: 32.9 g/dL (ref 30.0–36.0)
MCV: 94.9 fL (ref 78.0–100.0)
PLATELETS: 384 10*3/uL (ref 150–400)
RBC: 4.32 MIL/uL (ref 4.22–5.81)
RDW: 16 % — ABNORMAL HIGH (ref 11.5–15.5)
WBC: 19.1 10*3/uL — ABNORMAL HIGH (ref 4.0–10.5)

## 2015-01-23 LAB — BASIC METABOLIC PANEL
Anion gap: 7 (ref 5–15)
BUN: 13 mg/dL (ref 6–20)
CALCIUM: 7.9 mg/dL — AB (ref 8.9–10.3)
CO2: 35 mmol/L — AB (ref 22–32)
CREATININE: 0.86 mg/dL (ref 0.61–1.24)
Chloride: 96 mmol/L — ABNORMAL LOW (ref 101–111)
GFR calc Af Amer: >60 mL/min (ref >60)
GFR calc non Af Amer: >60 mL/min (ref >60)
GLUCOSE: 164 mg/dL — AB (ref 65–99)
Potassium: 3.8 mmol/L (ref 3.5–5.1)
Sodium: 138 mmol/L (ref 135–145)

## 2015-01-23 LAB — CULTURE, BLOOD (ROUTINE X 2)
CULTURE: NO GROWTH
CULTURE: NO GROWTH

## 2015-01-23 LAB — HEPARIN LEVEL (UNFRACTIONATED): Heparin Unfractionated: 0.37 IU/mL (ref 0.30–0.70)

## 2015-01-23 LAB — PROTIME-INR
INR: 3.98 — AB (ref 0.00–1.49)
PROTHROMBIN TIME: 37.8 s — AB (ref 11.6–15.2)

## 2015-01-23 MED ORDER — FUROSEMIDE 20 MG PO TABS
20.0000 mg | ORAL_TABLET | Freq: Every day | ORAL | Status: DC
  Administered 2015-01-23 – 2015-01-24 (×2): 20 mg via ORAL
  Filled 2015-01-23 (×2): qty 1

## 2015-01-23 MED ORDER — CARVEDILOL 25 MG PO TABS
37.5000 mg | ORAL_TABLET | Freq: Two times a day (BID) | ORAL | Status: DC
  Administered 2015-01-23 – 2015-01-24 (×3): 37.5 mg via ORAL
  Filled 2015-01-23 (×4): qty 1

## 2015-01-23 MED ORDER — OXYCODONE-ACETAMINOPHEN 5-325 MG PO TABS
1.0000 | ORAL_TABLET | ORAL | Status: DC | PRN
  Administered 2015-01-23: 2 via ORAL
  Administered 2015-01-23: 1 via ORAL
  Administered 2015-01-23 – 2015-01-24 (×3): 2 via ORAL
  Filled 2015-01-23 (×3): qty 2
  Filled 2015-01-23: qty 1
  Filled 2015-01-23: qty 2

## 2015-01-23 MED ORDER — LORAZEPAM 2 MG/ML IJ SOLN: 0.5000 mg | Freq: Four times a day (QID) | INTRAMUSCULAR | Status: DC | PRN

## 2015-01-23 NOTE — Progress Notes (Signed)
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  Extubated 1/1.    PT Comments    Made good progress today with mobility though SpO2 on RA dropped into the mid 80's,  EHR in the 90's bpm.  Follow Up Recommendations  Home health PT     Equipment Recommendations  None recommended by PT    Recommendations for Other Services       Precautions / Restrictions Precautions Precautions: Fall    Mobility  Bed Mobility Overal bed mobility: Needs Assistance Bed Mobility: Supine to Sit;Sit to Supine     Supine to sit: Min assist Sit to supine: Min assist      Transfers Overall transfer level: Needs assistance Equipment used: Rolling walker (2 wheeled) Transfers: Sit to/from Stand Sit to Stand: Min guard         General transfer comment: cues for hand placement  Ambulation/Gait Ambulation/Gait assistance: Min assist Ambulation Distance (Feet): 330 Feet Assistive device: Rolling walker (2 wheeled) Gait Pattern/deviations: Step-through pattern Gait velocity: slower Gait velocity interpretation: Below normal speed for age/gender General Gait Details: from mild unsteadiness to generally steady with the RW.  Noticeable fatigue.  Sats begun to drop  into the mid 80's % 200 feet  into the walk around the unit.  Pt needed cues for posture and safer use of the RW.   Stairs            Wheelchair Mobility    Modified Rankin (Stroke Patients Only)       Balance Overall balance assessment: Needs assistance Sitting-balance support: Single extremity supported;No upper extremity supported Sitting balance-Leahy Scale: Good       Standing balance-Leahy Scale: Fair                      Cognition Arousal/Alertness: Awake/alert Behavior During Therapy: WFL for tasks assessed/performed Overall Cognitive Status: Within Functional Limits for tasks assessed                      Exercises      General Comments        Pertinent Vitals/Pain Pain Assessment: Faces Faces Pain Scale: Hurts little more Pain Location: abdomen Pain Descriptors / Indicators: Aching;Discomfort Pain Intervention(s): Monitored during session    Home Living                      Prior Function            PT Goals (current goals can now be found in the care plan section) Acute Rehab PT Goals Patient Stated Goal: Home and independent PT Goal Formulation: With patient Time For Goal Achievement: 02/05/15 Potential to Achieve Goals: Good Progress towards PT goals: Progressing toward goals    Frequency  Min 3X/week    PT Plan Current plan remains appropriate    Co-evaluation             End of Session   Activity Tolerance: Patient tolerated treatment well Patient left: in bed;with call bell/phone within reach     Time: 1535-1600 PT Time Calculation (min) (ACUTE ONLY): 25 min  Charges:  $Gait Training: 8-22 mins $Therapeutic Activity: 8-22 mins  G Codes:      Schae Cando, Tessie Fass 01/23/2015, 4:52 PM 01/23/2015  Donnella Sham, [place] [phone]  (pager)

## 2015-01-23 NOTE — Progress Notes (Signed)
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  Extubated 1/1.  Precautions  Precautions Fall  Home Living  Family/patient expects to be discharged to: Private residence  Living Arrangements Children (2 sons)  Available Help at Discharge Family (sister is here to assist)  Type of [place] to enter  Entrance Stairs-Number of Steps 3  Entrance Stairs-Rails [place] One level  Bathroom Shower/Tub Walk-in shower  [place] - 2 wheels;Cane - single point;BSC;Shower seat;Other (comment) (lift chair)  Prior Function  Level of Independence Independent  Communication  Communication No difficulties  Pain Assessment  Pain Assessment Faces  Faces Pain Scale 4  Pain Location abdominal/L flank  Pain Descriptors / Indicators Aching;Discomfort;Grimacing  Pain Intervention(s) Monitored during session  Cognition  Arousal/Alertness Awake/alert  Behavior During Therapy Restless;WFL for tasks assessed/performed  Overall Cognitive Status Within Functional Limits for tasks assessed  Upper Extremity Assessment  Upper Extremity Assessment Overall WFL for tasks assessed  Lower Extremity Assessment  Lower Extremity Assessment Overall WFL for tasks assessed;Generalized weakness (proximal weakness )  Bed Mobility  Overal bed mobility Needs Assistance  Bed Mobility Supine to Sit  Supine to sit Min assist  Transfers  Overall transfer level Needs assistance  Transfers Sit to/from Stand;Stand Pivot Transfers  Sit to Stand Min assist  Stand pivot transfers Min guard  General transfer comment cues  for hand placement  Ambulation/Gait  General Gait Details pt declined due to dizziness  Balance  Overall balance assessment Needs assistance  Sitting-balance support No upper extremity supported  Sitting balance-Leahy Scale Good  Standing balance-Leahy Scale Fair  Standing balance comment stood x 2 min while taking a blood pressure.  Generally steady.    General Comments  General comments (skin integrity, edema, etc.) BP at EOB 121/75, standing 109/60's  less dizzy standing  PT - End of Session  Activity Tolerance Patient tolerated treatment well  Patient left in chair;with call bell/phone within reach  Nurse Communication Mobility status  PT Assessment  PT Therapy Diagnosis  Generalized weakness;Acute pain  PT Recommendation/Assessment Patient needs continued PT services  PT Problem List Decreased strength;Decreased activity tolerance;Decreased mobility;Cardiopulmonary status limiting activity;Pain  PT Plan  PT Frequency (ACUTE ONLY) Min 3X/week  PT Treatment/Interventions (ACUTE ONLY) Gait training;Stair training;DME instruction;Functional mobility training;Therapeutic activities;Patient/family education  PT Recommendation  Follow Up Recommendations Home health PT  PT equipment None recommended by PT  Individuals Consulted  Consulted and Agree with Results and Recommendations Patient  Acute Rehab PT Goals  Patient Stated Goal Home and independent  PT Goal Formulation With patient  Time For Goal Achievement 02/05/15  Potential to Achieve Goals Good  PT Time Calculation  PT Start Time (ACUTE ONLY) 1442  PT Stop Time (ACUTE ONLY) 1513  PT Time Calculation (min) (ACUTE ONLY) 31 min  PT General Charges  $$ ACUTE PT VISIT 1 Procedure  PT Evaluation  $PT Eval Moderate Complexity 1 Procedure  PT Treatments  $Therapeutic Activity 8-22 mins    01/23/2015  Donnella Sham, PT [phone]  (pager)

## 2015-01-23 NOTE — Progress Notes (Signed)
ANTICOAGULATION CONSULT NOTE - Follow Up Consult  Pharmacy Consult for Coumadin and Merrem Indication: atrial fibrillation and intra-abdominal infection   Allergies  Allergen Reactions  . Contrast Media [Iodinated Diagnostic Agents]     a diffuse macular rash after CTA chest     Patient Measurements: Height: 5\' 11"  (180.3 cm) Weight: (!) 311 lb 1.1 oz (141.1 kg) IBW/kg (Calculated) : 75.3 Heparin Dosing Weight:    Vital Signs: Temp: 97.7 F (36.5 C) (01/06 0716) Temp Source: Oral (01/06 0716) BP: 126/70 mmHg (01/06 0900) Pulse Rate: 115 (01/06 0930)  Labs:  Recent Labs  01/21/15 0500  01/22/15 0355 01/22/15 0750 01/22/15 1645 01/23/15 0335  HGB  --   --  11.8*  --   --  13.5  HCT  --   --  35.3*  --   --  41.0  PLT  --   --  328  --   --  384  LABPROT  --   --  21.6*  --   --  37.8*  INR  --   --  1.89*  --   --  3.98*  HEPARINUNFRC <0.10*  < > <0.10* 0.12* 0.13* 0.37  CREATININE 1.00  --  0.71  --   --  0.86  < > = values in this interval not displayed.  Estimated Creatinine Clearance: 152.6 mL/min (by C-G formula based on Cr of 0.86).   Assessment:  Anticoag: Afib (Pradaxa PTA - last dose 12/28 @0700 ) - No baseline INR. INR 1.89>3.98?? after 2 doses. Unclear if baseline is elevated or if INR jumped significantly (wouldn't think pradax is influencing at this point), Heparin d/c'd this AM. Hold Coumadin. Hgb 11.8>13.5. No major drug interactions noted. - 1/6: MD notes Some leaking from his surgical dressings this morning  ID: Meropenem D#6 for intra-abd infxn/perforated diverticulitis - Afebrile, WBC 20.2>19.1, Scr 0.86 -  Zosyn 12/28>> 12/30 Ertapenem 12/30 >> 12/31 Levaquin 1/1 >>1/4 Meropenem 1/1 >>  12/28 UCx>> neg 1/1 blood x 2- NGTD  Goal of Therapy:  INR 2-3  Eradication of infection Monitor platelets by anticoagulation protocol: Yes   Plan:  - Continue meropenem 1gm IV Q8H - F/u renal fxn, C&S, clinical status - Hold Coumadin   Angelik Walls S.  Alford Highland, PharmD, [facility] Clinical Staff Pharmacist Pager [phone]  Eilene Ghazi Stillinger 01/23/2015,10:44 AM

## 2015-01-23 NOTE — Consult Note (Signed)
WOC wound consult note Reason for Consult: placement of NPWT VAC to midline abdominal wound Wound type: surgical  Pressure Ulcer POA: No Measurement:26cmx 7.5cm x 5.5cm  Wound [id number] tissue, beefy, red, moist Drainage (amount, consistency, odor) heavy, serosanguinous  Periwound: intact  Dressing procedure/placement/frequency: 2pc of black foam used to fill the wound bed, sealed at 135mmHG. Pt tolerated well.   [place] team will follow along for [facility] dressing changes and continued support with ostomy teaching.  See my partner's note from ostomy teaching session yesterday.  Today when I arrived he had quite a bit of gas in the pouch, I demonstrated to the patient how to "burp" the gas from the pouch.  Rilee Wendling Ackworth RN,CWOCN [id number]

## 2015-01-23 NOTE — Progress Notes (Signed)
Patient ID: William Fischer, male   DOB: 07/02/1965, 50 y.o.   MRN: [id number]     [address] [place], [place] [address]    Phone: [phone] FAX: [phone]     Subjective:  No n/v.  Tolerating fulls. Ostomy functioning.  WBC down.  Afebrile.    Objective:  Vital signs:  Filed Vitals:   01/23/15 0400 01/23/15 0500 01/23/15 0600 01/23/15 0716  BP: 105/73 117/75 126/78   Pulse: 103 95 98   Temp: 97.7 F (36.5 C)   97.7 F (36.5 C)  TempSrc: Oral   Oral  Resp: 25 24 25    Height:   5' 11"  (1.803 m)   Weight:   141.1 kg (311 lb 1.1 oz)   SpO2: 95% 97% 96%     Last BM Date: 01/23/15  Intake/Output   Yesterday:  01/05 0701 - 01/06 0700 In: 2865.9 [P.O.:1760; I.V.:1005.9; IV Piggyback:100] Out: 4820 [Urine:4425; Stool:395] This shift:    I/O last 3 completed shifts: In: 9794 [P.O.:2240; I.V.:1417; IV Piggyback:350] Out: 8016 [PVVZS:8270; Stool:495]    Physical Exam: General: Pt awake/alert/oriented x4 in no acute distress Abdomen: Soft.  Nondistended.  Mildly tender at incisions only.  Midline wound is clean, fascia is intact.  ruq colostomy is functioning. No evidence of peritonitis.  No incarcerated hernias.    Problem List:   Active Problems:   Diverticulitis of colon with perforation   Hypoxia   Atrial fibrillation with rapid ventricular response (HCC)   Systolic dysfunction with acute on chronic heart failure ([place])   Perforated bowel ([place])   Respiratory failure ([place])    Results:   Labs: Results for orders placed or performed during the hospital encounter of 01/14/15 (from the past 48 hour(s))  Glucose, capillary     Status: Abnormal   Collection Time: 01/21/15 11:28 AM  Result Value Ref Range   Glucose-Capillary 125 (H) 65 - 99 mg/dL   Comment 1 Notify RN   Heparin level (unfractionated)     Status: Abnormal   Collection Time: 01/21/15  1:30 PM  Result Value Ref Range   Heparin Unfractionated <0.10 (L) 0.30 - 0.70 IU/mL    Comment:        IF HEPARIN RESULTS ARE BELOW EXPECTED VALUES, AND PATIENT DOSAGE HAS BEEN CONFIRMED, SUGGEST FOLLOW UP TESTING OF ANTITHROMBIN III LEVELS. REPEATED TO VERIFY   Glucose, capillary     Status: Abnormal   Collection Time: 01/21/15  7:17 PM  Result Value Ref Range   Glucose-Capillary 207 (H) 65 - 99 mg/dL   Comment 1 Capillary Specimen    Comment 2 Notify RN    Comment 3 Document in Chart   Heparin level (unfractionated)     Status: Abnormal   Collection Time: 01/21/15 11:30 PM  Result Value Ref Range   Heparin Unfractionated <0.10 (L) 0.30 - 0.70 IU/mL    Comment:        IF HEPARIN RESULTS ARE BELOW EXPECTED VALUES, AND PATIENT DOSAGE HAS BEEN CONFIRMED, SUGGEST FOLLOW UP TESTING OF ANTITHROMBIN III LEVELS. REPEATED TO VERIFY   Glucose, capillary     Status: Abnormal   Collection Time: 01/21/15 11:32 PM  Result Value Ref Range   Glucose-Capillary 135 (H) 65 - 99 mg/dL   Comment 1 Venous Specimen    Comment 2 Notify RN    Comment 3 Document in Chart   Glucose, capillary     Status: Abnormal  Collection Time: 01/22/15  3:33 AM  Result Value Ref Range   Glucose-Capillary 101 (H) 65 - 99 mg/dL   Comment 1 Capillary Specimen    Comment 2 Notify RN    Comment 3 Document in Chart   CBC     Status: Abnormal   Collection Time: 01/22/15  3:55 AM  Result Value Ref Range   WBC 20.2 (H) 4.0 - 10.5 K/uL   RBC 3.81 (L) 4.22 - 5.81 MIL/uL   Hemoglobin 11.8 (L) 13.0 - 17.0 g/dL   HCT 35.3 (L) 39.0 - 52.0 %   MCV 92.7 78.0 - 100.0 fL   MCH 31.0 26.0 - 34.0 pg   MCHC 33.4 30.0 - 36.0 g/dL   RDW 15.8 (H) 11.5 - 15.5 %   Platelets 328 150 - 400 K/uL  Basic metabolic panel     Status: Abnormal   Collection Time: 01/22/15  3:55 AM  Result Value Ref Range   Sodium 139 135 - 145 mmol/L   Potassium 3.4 (L) 3.5 - 5.1 mmol/L    Comment: DELTA CHECK NOTED   Chloride 105 101 - 111 mmol/L   CO2 31 22 - 32 mmol/L    Glucose, Bld 110 (H) 65 - 99 mg/dL   BUN 16 6 - 20 mg/dL   Creatinine, Ser 0.71 0.61 - 1.24 mg/dL   Calcium 6.9 (L) 8.9 - 10.3 mg/dL   GFR calc non Af Amer >60 >60 mL/min   GFR calc Af Amer >60 >60 mL/min    Comment: (NOTE) The eGFR has been calculated using the CKD EPI equation. This calculation has not been validated in all clinical situations. eGFR's persistently <60 mL/min signify possible Chronic Kidney Disease.    Anion gap 3 (L) 5 - 15  Protime-INR     Status: Abnormal   Collection Time: 01/22/15  3:55 AM  Result Value Ref Range   Prothrombin Time 21.6 (H) 11.6 - 15.2 seconds   INR 1.89 (H) 0.00 - 1.49  Heparin level (unfractionated)     Status: Abnormal   Collection Time: 01/22/15  3:55 AM  Result Value Ref Range   Heparin Unfractionated <0.10 (L) 0.30 - 0.70 IU/mL    Comment:        IF HEPARIN RESULTS ARE BELOW EXPECTED VALUES, AND PATIENT DOSAGE HAS BEEN CONFIRMED, SUGGEST FOLLOW UP TESTING OF ANTITHROMBIN III LEVELS. REPEATED TO VERIFY   Glucose, capillary     Status: Abnormal   Collection Time: 01/22/15  7:32 AM  Result Value Ref Range   Glucose-Capillary 102 (H) 65 - 99 mg/dL   Comment 1 Capillary Specimen    Comment 2 Notify RN   Heparin level (unfractionated)     Status: Abnormal   Collection Time: 01/22/15  7:50 AM  Result Value Ref Range   Heparin Unfractionated 0.12 (L) 0.30 - 0.70 IU/mL    Comment:        IF HEPARIN RESULTS ARE BELOW EXPECTED VALUES, AND PATIENT DOSAGE HAS BEEN CONFIRMED, SUGGEST FOLLOW UP TESTING OF ANTITHROMBIN III LEVELS.   Heparin level (unfractionated)     Status: Abnormal   Collection Time: 01/22/15  4:45 PM  Result Value Ref Range   Heparin Unfractionated 0.13 (L) 0.30 - 0.70 IU/mL    Comment:        IF HEPARIN RESULTS ARE BELOW EXPECTED VALUES, AND PATIENT DOSAGE HAS BEEN CONFIRMED, SUGGEST FOLLOW UP TESTING OF ANTITHROMBIN III LEVELS.   CBC     Status: Abnormal   Collection Time:  01/23/15  3:35 AM  Result Value  Ref Range   WBC 19.1 (H) 4.0 - 10.5 K/uL   RBC 4.32 4.22 - 5.81 MIL/uL   Hemoglobin 13.5 13.0 - 17.0 g/dL   HCT 41.0 39.0 - 52.0 %   MCV 94.9 78.0 - 100.0 fL   MCH 31.3 26.0 - 34.0 pg   MCHC 32.9 30.0 - 36.0 g/dL   RDW 16.0 (H) 11.5 - 15.5 %   Platelets 384 150 - 400 K/uL  Protime-INR     Status: Abnormal   Collection Time: 01/23/15  3:35 AM  Result Value Ref Range   Prothrombin Time 37.8 (H) 11.6 - 15.2 seconds   INR 3.98 (H) 0.00 - 1.49  BMET in AM     Status: Abnormal   Collection Time: 01/23/15  3:35 AM  Result Value Ref Range   Sodium 138 135 - 145 mmol/L   Potassium 3.8 3.5 - 5.1 mmol/L   Chloride 96 (L) 101 - 111 mmol/L   CO2 35 (H) 22 - 32 mmol/L   Glucose, Bld 164 (H) 65 - 99 mg/dL   BUN 13 6 - 20 mg/dL   Creatinine, Ser 0.86 0.61 - 1.24 mg/dL   Calcium 7.9 (L) 8.9 - 10.3 mg/dL   GFR calc non Af Amer >60 >60 mL/min   GFR calc Af Amer >60 >60 mL/min    Comment: (NOTE) The eGFR has been calculated using the CKD EPI equation. This calculation has not been validated in all clinical situations. eGFR's persistently <60 mL/min signify possible Chronic Kidney Disease.    Anion gap 7 5 - 15  Heparin level (unfractionated)     Status: None   Collection Time: 01/23/15  3:35 AM  Result Value Ref Range   Heparin Unfractionated 0.37 0.30 - 0.70 IU/mL    Comment:        IF HEPARIN RESULTS ARE BELOW EXPECTED VALUES, AND PATIENT DOSAGE HAS BEEN CONFIRMED, SUGGEST FOLLOW UP TESTING OF ANTITHROMBIN III LEVELS.     Imaging / Studies: Dg Chest Port 1 View  01/23/2015  CLINICAL DATA:  Status post exploratory laparotomy and subsequent colectomy and colostomy placement for perforated bowel secondary to diverticulitis; hypoxia, atrial fibrillation, acute and chronic CHF. EXAM: PORTABLE CHEST 1 VIEW COMPARISON:  Portable chest x-ray of January 21, 2015 FINDINGS: The lungs are mildly hypoinflated. The interstitial markings on the left have improved. The hemidiaphragms remain it  obscured. The cardiac silhouette remains enlarged. The pulmonary vascularity is slightly less engorged centrally. The observed bony thorax is unremarkable. IMPRESSION: Interval improvement in the pulmonary interstitium consistent with decreasing interstitial edema. There is persistent cardiomegaly, pulmonary interstitial edema, and small bilateral pleural effusions. Electronically Signed   By: David  Martinique M.D.   On: 01/23/2015 07:41   Dg Chest Port 1 View  01/22/2015  CLINICAL DATA:  Dyspnea. EXAM: PORTABLE CHEST 1 VIEW COMPARISON:  01/19/2015 FINDINGS: Enteric tube is been removed. Tip of the right central line in the region of the proximal SVC. Low lung volumes again seen. Progressive bibasilar opacities, likely combination of pleural effusion and consolidation. Heart appears prominent in size. Vascular congestion suspected, not well assessed due to low lung volumes. IMPRESSION: Hypoventilatory chest with progressive bibasilar opacities, likely combination of pleural effusion and consolidation. Consolidation may be related to atelectasis, aspiration, or pneumonia. Electronically Signed   By: Jeb Levering M.D.   On: 01/22/2015 00:02    Medications / Allergies:  Scheduled Meds: . antiseptic oral rinse  7 mL Mouth  Rinse BID  . carvedilol  37.5 mg Oral BID WC  . furosemide  20 mg Oral Daily  . meropenem (MERREM) IV  1 g Intravenous 3 times per day  . pantoprazole  40 mg Oral Q1200  . Warfarin - Pharmacist Dosing Inpatient   Does not apply q1800   Continuous Infusions: . sodium chloride 10 mL/hr at 01/23/15 0700   PRN Meds:.fentaNYL (SUBLIMAZE) injection, ipratropium, levalbuterol, [DISCONTINUED] ondansetron **OR** ondansetron (ZOFRAN) IV  Antibiotics: Anti-infectives    Start     Dose/Rate Route Frequency Ordered Stop   01/18/15 0400  levofloxacin (LEVAQUIN) IVPB 750 mg  Status:  Discontinued     750 mg 100 mL/hr over 90 Minutes Intravenous Every 24 hours 01/18/15 0210 01/21/15 1108    01/18/15 0215  meropenem (MERREM) 1 g in sodium chloride 0.9 % 100 mL IVPB     1 g 200 mL/hr over 30 Minutes Intravenous 3 times per day 01/18/15 0210     01/16/15 0930  ertapenem (INVANZ) 1 g in sodium chloride 0.9 % 50 mL IVPB  Status:  Discontinued     1 g 100 mL/hr over 30 Minutes Intravenous Every 24 hours 01/16/15 0841 01/18/15 0203   01/14/15 1245  piperacillin-tazobactam (ZOSYN) IVPB 3.375 g  Status:  Discontinued     3.375 g 12.5 mL/hr over 240 Minutes Intravenous 3 times per day 01/14/15 1238 01/16/15 0841   01/14/15 1230  ciprofloxacin (CIPRO) IVPB 400 mg  Status:  Discontinued     400 mg 200 mL/hr over 60 Minutes Intravenous  Once 01/14/15 1218 01/14/15 1238   01/14/15 1230  metroNIDAZOLE (FLAGYL) IVPB 500 mg  Status:  Discontinued     500 mg 100 mL/hr over 60 Minutes Intravenous  Once 01/14/15 1218 01/14/15 1238        Assessment/Plan Ischemic colitis POD#6 left colectomy with colostomy---Dr. Donne Hazel  -advance diet -WOC for ostomy care -RN to place wound VAC, CM consult for approval  -mobilize, IS, add po pain meds  ID-meropenem 1/1---> continue  DVT prophylaxis-coumadin  Dispo-may transfer out from a surgical standpoint  Erby Pian, [facility] Surgery Pager [phone](7A-4:30P) For consults and floor pages call [phone](7A-4:30P)  01/23/2015 8:59 AM

## 2015-01-23 NOTE — Progress Notes (Signed)
Brief X-Cover Note  INR increased to 4.0 (from 1.8) so heparin discontinued this early morning. Indication for anticoagulation is atrial fibrillation.   Jules Husbands, MD

## 2015-01-23 NOTE — Progress Notes (Signed)
PT Profile  50 y.o. male with a past medical history significant for longstanding persistent atrial fibrillation, previous tachycardia induced cardiomyopathy, depression, and obesity. He has been followed by Dr Rayann Heman for EP care and Truitt Merle for cardiology. He failed TEE/DCCV in 11/2011. He was then placed on Sotalol and repeat DCCV was also unsuccessful. With obesity and severe LA enlargement, he was not felt to be a candidate for PVI and rate control was recommended. He has done relatively well from an AF standpoint but has declined OSA evaluation in the past and also has not taken ACE-I as recommended- admitted with abd pain underwent EXPLORATORY LAPAROTOMY WITH LEFT COLECTOMY AND COLOSTOMY 01/17/15.  Was on Vent. Now extubated.  We were asked to see for tachycardia and management.  EP signed off yesterday.  Currently on metoprolol 5 mg IV every 6 hrs,   Dilt drip at.  HR controlled at low 100s  Subjective: HR up and down last night, but generally better controlled. INR 4, therefore off heparin gtts. Denies shortness of breath. Some leaking from his surgical dressings this morning.  Objective: Vital signs in last 24 hours: Temp:  [97.6 F (36.4 C)-98 F (36.7 C)] 97.7 F (36.5 C) (01/06 0716) Pulse Rate:  [70-114] 98 (01/06 0600) Resp:  [20-37] 25 (01/06 0600) BP: (89-126)/(52-88) 126/78 mmHg (01/06 0600) SpO2:  [90 %-98 %] 96 % (01/06 0600) FiO2 (%):  [50 %] 50 % (01/05 1100) Weight:  [311 lb 1.1 oz (141.1 kg)] 311 lb 1.1 oz (141.1 kg) (01/06 0600) Weight change:  Last BM Date: 01/23/15 Intake/Output from previous day: +474 (total since admit +3729) 01/05 0701 - 01/06 0700 In: 2865.9 [P.O.:1760; I.V.:1005.9; IV Piggyback:100] Out: 4820 [Urine:4425; Stool:395] Intake/Output this shift:    PE: General:Pleasant affect, NAD Skin:Warm and dry, brisk capillary refill HEENT:normocephalic, sclera clear, mucus membranes moist Heart:irreg irreg without murmur, [place],  rub or click Lungs: diminished in bases without rales, + rhonchi, occ wheezes Abd:, + surgical tenderness, + BS, do not palpate liver spleen or masses- colostomy bag full Ext:no lower ext edema,  2+ radial pulses Neuro:alert and oriented x 3, MAE, follows commands, + facial symmetry Tele: a fib rate in 100-110   Lab Results:  Recent Labs  01/22/15 0355 01/23/15 0335  WBC 20.2* 19.1*  HGB 11.8* 13.5  HCT 35.3* 41.0  PLT 328 384   BMET  Recent Labs  01/22/15 0355 01/23/15 0335  NA 139 138  K 3.4* 3.8  CL 105 96*  CO2 31 35*  GLUCOSE 110* 164*  BUN 16 13  CREATININE 0.71 0.86  CALCIUM 6.9* 7.9*   No results for input(s): TROPONINI in the last 72 hours.  Invalid input(s): CK, MB  Lab Results  Component Value Date   CHOL 149 07/16/2013   HDL 46.20 07/16/2013   LDLCALC 87 07/16/2013   TRIG 156* 01/17/2015   CHOLHDL 3 07/16/2013   Lab Results  Component Value Date   HGBA1C 6.1* 01/15/2015     Lab Results  Component Value Date   TSH 2.14 07/16/2013    Hepatic Function Panel No results for input(s): PROT, ALBUMIN, AST, ALT, ALKPHOS, BILITOT, BILIDIR, IBILI in the last 72 hours. No results for input(s): CHOL in the last 72 hours. No results for input(s): PROTIME in the last 72 hours.  Studies/Results: ECHO 01/16/15 Study Conclusions  - Left ventricle: EF hard to judge due to rapid afib. Poor image quality abnormal septal motion The cavity size was normal.  Wall thickness was normal. Systolic function was mildly reduced. The estimated ejection fraction was in the range of 45% to 50%. - Mitral valve: There was mild regurgitation. - Left atrium: The atrium was moderately dilated. - Atrial septum: No defect or patent foramen ovale was identified. - Pericardium, extracardiac: Small lateral pericardial effusion - Impressions: Consider TEE if clinically indicated. ? lipomatous hypertrophy of atrial septum cannot r/o mass in RA but suspect this is off  axis artifact.  Impressions:  - Consider TEE if clinically indicated. ? lipomatous hypertrophy of atrial septum cannot r/o mass in RA but suspect this is off axis artifact.   Medications: I have reviewed the patient's current medications. Scheduled Meds: . antiseptic oral rinse  7 mL Mouth Rinse BID  . carvedilol  25 mg Oral BID WC  . meropenem (MERREM) IV  1 g Intravenous 3 times per day  . pantoprazole  40 mg Oral Q1200  . Warfarin - Pharmacist Dosing Inpatient   Does not apply q1800   Continuous Infusions: . sodium chloride 10 mL/hr at 01/23/15 0700  . heparin 3,300 Units/hr (01/23/15 0700)   PRN Meds:.fentaNYL (SUBLIMAZE) injection, ipratropium, levalbuterol, [DISCONTINUED] ondansetron **OR** ondansetron (ZOFRAN) IV  Assessment/Plan: Active Problems:   Diverticulitis of colon with perforation S/P expl lap and LEFT COLECTOMY AND COLOSTOMY 01/17/15--POD #4    Hypoxia- extubated 01/18/15 stable- resp rate 27 to 28    Atrial fibrillation with rapid ventricular response ([place])- permanent a fib, increase carvedilol for better rate control to 37.5 mg BID -Heparin discontinued, adjust warfarin to maintain INR 2-3    Systolic dysfunction with acute on chronic heart failure ([place]) - still + I&O  EF 45-50%  - lasix yesterday, diuresed another 1.9L.  He is now about even on fluids. Would resume home oral lasix. - consider low dose ACE-I or ARB prior to discharge.  - AKI with acute illness - now Cr at baseline  - Hypokalemia - replete.  Pixie Casino, MD, [facility] Attending Cardiologist [facility]   LOS: 9 days  Pixie Casino  01/23/2015, 7:36 AM

## 2015-01-23 NOTE — Progress Notes (Signed)
Transfer Summary  William Fischer was taken to surgery on 12/31 due to free air noted on his repeat CT abdomen. He was emergently taken to the OR for a left colectomy and colostomy. He was exteutic for management of his atrial fibrillation. His post-surgical antibiotics were levaquin and meropenem (1/1 -) for peritonitis. Extubated on 1/1 and weaned of pressors. He was started on heparin on 1/3 and bridged to warfarin only on 1/6. William Fischer which responded well to diuresis. Pulmonary edema has been noted on chest x-ray and is improving.  Subjective:  William Fischer. When I walked in, there was a pool of serosanguinous fluid on the floor, dripping from his surgical site. This was causing William Fischer. He endorsed pain at the surgical site as well has pain in his groin.   Objective: Vital signs in last 24 hours: Filed Vitals:   01/23/15 0900 01/23/15 0930 01/23/15 1116 01/23/15 1200  BP: 126/70   109/66  Pulse: 108 115  98  Temp:   97.8 F (36.6 C)   TempSrc:   Oral   Resp: 31 25  32  Height:      Weight:      SpO2: 97% 96%  98%   Weight change:   Intake/Output Summary (Last 24 hours) at 01/23/15 1329 Last data filed at 01/23/15 1100  Gross per 24 hour  Intake   2009 ml  Output   2745 ml  Net   -736 ml   Physical Exam General: Sitting in chair, visibly anxious  Cardiovascular: Irregularly, irregular rhythm, no m/r/g. Slightly tachycardic. Pulmonary: Fine crackles in the bases. Abdominal: Colostomy bag in place. Midline surgical site packed and oozing large volumes of serosanguinous fluid Extremities: No clubbing, cyanosis, or edema. Psychiatric: Anxious affect. Behavior normal.  Lab Results: Basic Metabolic Panel:  Recent Labs Lab 01/18/15 0030 01/19/15 0445  01/22/15 0355 01/23/15 0335  NA 136 139  < > 139 138  K 4.0 3.5  < > 3.4* 3.8  CL 99* 104  < > 105 96*  CO2 26 26  < > 31  35*  GLUCOSE 196* 166*  < > 110* 164*  BUN 35* 52*  < > 16 13  CREATININE 1.81* 1.80*  < > 0.71 0.86  CALCIUM 7.1* 7.5*  < > 6.9* 7.9*  MG 1.4* 2.2  --   --   --   PHOS  --  3.9  --   --   --   < > = values in this interval not displayed. Liver Function Tests:  Recent Labs Lab 01/19/15 0445  AST 47*  ALT 26  ALKPHOS 67  BILITOT 1.1  PROT 4.9*  ALBUMIN 1.4*   CBC:  Recent Labs Lab 01/22/15 0355 01/23/15 0335  WBC 20.2* 19.1*  HGB 11.8* 13.5  HCT 35.3* 41.0  MCV 92.7 94.9  PLT 328 384    CBG:  Recent Labs Lab 01/21/15 0810 01/21/15 1128 01/21/15 1917 01/21/15 2332 01/22/15 0333 01/22/15 0732  GLUCAP 118* 125* 207* 135* 101* 102*   Fasting Lipid Panel:  Recent Labs Lab 01/17/15 1923  TRIG 156*   Coagulation:  Recent Labs Lab 01/22/15 0355 01/23/15 0335  LABPROT 21.6* 37.8*  INR 1.89* 3.98*   Micro Results: Recent Results (from the past 240 hour(s))  Urine culture     Status: None   Collection Time: 01/14/15 11:33 AM  Result Value Ref Range Status  Specimen Description URINE, RANDOM  Final   Special Requests Normal  Final   Culture MULTIPLE SPECIES PRESENT, SUGGEST RECOLLECTION  Final   Report Status 01/16/2015 FINAL  Final  MRSA PCR Screening     Status: None   Collection Time: 01/14/15  6:05 PM  Result Value Ref Range Status   MRSA by PCR NEGATIVE NEGATIVE Final    Comment:        The GeneXpert MRSA Assay (FDA approved for NASAL specimens only), is one component of a comprehensive MRSA colonization surveillance program. It is not intended to diagnose MRSA infection nor to guide or monitor treatment for MRSA infections.   Culture, blood (routine x 2)     Status: None   Collection Time: 01/18/15  3:34 AM  Result Value Ref Range Status   Specimen Description BLOOD RIGHT HAND  Final   Special Requests BOTTLES DRAWN AEROBIC ONLY 5CC  Final   Culture NO GROWTH 5 DAYS  Final   Report Status 01/23/2015 FINAL  Final  Culture, blood  (routine x 2)     Status: None   Collection Time: 01/18/15  3:39 AM  Result Value Ref Range Status   Specimen Description BLOOD LEFT HAND  Final   Special Requests BOTTLES DRAWN AEROBIC AND ANAEROBIC 5CC   Final   Culture NO GROWTH 5 DAYS  Final   Report Status 01/23/2015 FINAL  Final   Studies/Results: Dg Chest Port 1 View  01/23/2015  CLINICAL DATA:  Status post exploratory laparotomy and subsequent colectomy and colostomy placement for perforated bowel secondary to diverticulitis; hypoxia, atrial fibrillation, acute and chronic CHF. EXAM: PORTABLE CHEST 1 VIEW COMPARISON:  Portable chest x-ray of January 21, 2015 FINDINGS: The lungs are mildly hypoinflated. The interstitial markings on the left have improved. The hemidiaphragms remain it obscured. The cardiac silhouette remains enlarged. The pulmonary vascularity is slightly less engorged centrally. The observed bony thorax is unremarkable. IMPRESSION: Interval improvement in the pulmonary interstitium consistent with decreasing interstitial edema. There is persistent cardiomegaly, pulmonary interstitial edema, and small bilateral pleural effusions. Electronically Signed   By: David  Martinique M.D.   On: 01/23/2015 07:41   Dg Chest Port 1 View  01/22/2015  CLINICAL DATA:  Fischer. EXAM: PORTABLE CHEST 1 VIEW COMPARISON:  01/19/2015 FINDINGS: Enteric tube is been removed. Tip of the right central line in the region of the proximal SVC. Low lung volumes again seen. Progressive bibasilar opacities, likely combination of pleural effusion and consolidation. Heart appears prominent in size. Vascular congestion suspected, not well assessed due to low lung volumes. IMPRESSION: Hypoventilatory chest with progressive bibasilar opacities, likely combination of pleural effusion and consolidation. Consolidation may be related to atelectasis, aspiration, or pneumonia. Electronically Signed   By: Jeb Levering M.D.   On: 01/22/2015 00:02   Medications: I have  reviewed the patient's current medications. Scheduled Meds: . antiseptic oral rinse  7 mL Mouth Rinse BID  . carvedilol  37.5 mg Oral BID WC  . furosemide  20 mg Oral Daily  . meropenem (MERREM) IV  1 g Intravenous 3 times per day  . pantoprazole  40 mg Oral Q1200  . Warfarin - Pharmacist Dosing Inpatient   Does not apply q1800   Continuous Infusions: . sodium chloride 10 mL/hr at 01/23/15 0700   PRN Meds:.fentaNYL (SUBLIMAZE) injection, ipratropium, levalbuterol, [DISCONTINUED] ondansetron **OR** ondansetron (ZOFRAN) IV, oxyCODONE-acetaminophen Assessment/Plan: Active Problems:   Diverticulitis of colon with perforation   Hypoxia   Atrial fibrillation with rapid ventricular response ([place])  Systolic dysfunction with acute on chronic heart failure (HCC)   Perforated bowel (HCC)   Respiratory failure ([place])  Perforated Bowel s/p left colectomy and colostomy: Etiology initially thought to be from diverticulitis, but it appeared that the bowel may have been ischemic. Patient to have wound vac placed today. Case management is arranging services to have the wound vac managed at home. Surgery is following and recommends transfer out from surgical standpoint.  - Oxycodone 5-325 mg q4h prn pain, Fentanyl 50-100 mcg q2h prn pain for breakthrough - Levaquin (1/1 - 1/4 discontinued), Meropenem 1 mg q8h (1/1 - ) - Wound vac in place  Atrial Fibrillation with RVR: Rates have been relatively well controlled today, with readings in the 90s and low 100s. Patient is on warfarin now, but on pradaxa at home. Consider switch to pradaxa prior to discharge. Cardiology is following - Carvedilol 37.5 mg BID - Warfarin per pharm, consider transition to pradaxa  Hypoxia: Pulmonary edema improving on CXR. Currently satting 98% on 2LNC.  - Start home lasix 20 mg daily - Wean O2 as tolerated - Atrovent and Xopenex nebulizer q2h prn  DVT prophylaxis: Warfarin  Dispo: Disposition is deferred at this time,  awaiting improvement of current medical problems.  Anticipated discharge in approximately 1-2 day(s).   The patient does have a current PCP William Grayer, MD) and does not need an [facility] hospital follow-up appointment after discharge.  The patient does have transportation limitations that hinder transportation to clinic appointments.  .Services Needed at time of discharge: Y = Yes, Blank = No PT:   OT:   RN:   Equipment:   Other:     LOS: 9 days   Liberty Handy, MD 01/23/2015, 1:29 PM p

## 2015-01-23 NOTE — Care Management Note (Addendum)
Case Management Note  Patient Details  Name: William Fischer MRN: [id number] Date of Birth: 1965-09-27  Subjective/Objective:     Pt to d/c home with wound vac and home health RN for vac changes.  Provided list of home health agencies, referral to [facility] liaison per choice.  Documentation faxed to [facility] for vac.  Pt states his sister is a Marine scientist and will be staying with him for at least a week after he is discharged.                      Expected Discharge Plan:  [place]  Discharge planning Services  CM Consult  Post Acute Care Choice:  Durable Medical Equipment, Home Health Choice offered to:  Patient  DME Arranged:  Vac DME Agency:  [facility] Arranged:  RN [facility] Agency:  [place]  Status of Service:  In process, will continue to follow  Girard Cooter, RN 01/23/2015, 1:38 PM  01/24/15 8:38 AM:  Received TC from pt's sister who stated she will not be able to take care of patient as he requested - she will return to [place] shortly.  She is concerned about pt's mother's ability to manage him when he is discharged.  Pt has Medicaid and will not qualify for either SNF for rehab or for home health PT.  Eval yesterday noted pt's progress with mobility - CM will continue to follow.

## 2015-01-24 DIAGNOSIS — R0600 Dyspnea, unspecified: Secondary | ICD-10-CM | POA: Insufficient documentation

## 2015-01-24 DIAGNOSIS — J90 Pleural effusion, not elsewhere classified: Secondary | ICD-10-CM | POA: Insufficient documentation

## 2015-01-24 DIAGNOSIS — R000 Tachycardia, unspecified: Secondary | ICD-10-CM

## 2015-01-24 LAB — BASIC METABOLIC PANEL
Anion gap: 6 (ref 5–15)
BUN: 11 mg/dL (ref 6–20)
CALCIUM: 8.2 mg/dL — AB (ref 8.9–10.3)
CHLORIDE: 95 mmol/L — AB (ref 101–111)
CO2: 39 mmol/L — AB (ref 22–32)
Creatinine, Ser: 0.74 mg/dL (ref 0.61–1.24)
GFR calc Af Amer: >60 mL/min (ref >60)
GFR calc non Af Amer: >60 mL/min (ref >60)
GLUCOSE: 137 mg/dL — AB (ref 65–99)
Potassium: 4.3 mmol/L (ref 3.5–5.1)
Sodium: 140 mmol/L (ref 135–145)

## 2015-01-24 LAB — GLUCOSE, CAPILLARY: Glucose-Capillary: 186 mg/dL — ABNORMAL HIGH (ref 65–99)

## 2015-01-24 LAB — CBC
HCT: 43.3 % (ref 39.0–52.0)
Hemoglobin: 13.8 g/dL (ref 13.0–17.0)
MCH: 31.1 pg (ref 26.0–34.0)
MCHC: 31.9 g/dL (ref 30.0–36.0)
MCV: 97.5 fL (ref 78.0–100.0)
PLATELETS: 389 10*3/uL (ref 150–400)
RBC: 4.44 MIL/uL (ref 4.22–5.81)
RDW: 16.2 % — ABNORMAL HIGH (ref 11.5–15.5)
WBC: 14 10*3/uL — ABNORMAL HIGH (ref 4.0–10.5)

## 2015-01-24 LAB — PROCALCITONIN: Procalcitonin: 0.17 ng/mL

## 2015-01-24 LAB — PROTIME-INR
INR: 2.39 — ABNORMAL HIGH (ref 0.00–1.49)
PROTHROMBIN TIME: 25.8 s — AB (ref 11.6–15.2)

## 2015-01-24 MED ORDER — OXYCODONE HCL ER 10 MG PO T12A
20.0000 mg | EXTENDED_RELEASE_TABLET | Freq: Two times a day (BID) | ORAL | Status: DC
  Administered 2015-01-24 – 2015-02-02 (×19): 20 mg via ORAL
  Filled 2015-01-24 (×19): qty 2

## 2015-01-24 MED ORDER — METOPROLOL TARTRATE 50 MG PO TABS
75.0000 mg | ORAL_TABLET | Freq: Two times a day (BID) | ORAL | Status: DC
  Administered 2015-01-24 (×2): 75 mg via ORAL
  Filled 2015-01-24 (×3): qty 1

## 2015-01-24 MED ORDER — WARFARIN SODIUM 5 MG PO TABS
5.0000 mg | ORAL_TABLET | Freq: Once | ORAL | Status: AC
  Administered 2015-01-24: 5 mg via ORAL
  Filled 2015-01-24: qty 1

## 2015-01-24 MED ORDER — FUROSEMIDE 40 MG PO TABS
40.0000 mg | ORAL_TABLET | Freq: Every day | ORAL | Status: DC
  Administered 2015-01-25 – 2015-01-26 (×2): 40 mg via ORAL
  Filled 2015-01-24 (×2): qty 1

## 2015-01-24 MED ORDER — OXYCODONE HCL ER 10 MG PO T12A: 20.0000 mg | EXTENDED_RELEASE_TABLET | Freq: Two times a day (BID) | ORAL | Status: DC

## 2015-01-24 MED ORDER — OXYCODONE HCL 5 MG PO TABS
5.0000 mg | ORAL_TABLET | ORAL | Status: DC | PRN
  Administered 2015-01-24 – 2015-02-01 (×16): 5 mg via ORAL
  Filled 2015-01-24 (×16): qty 1

## 2015-01-24 NOTE — Progress Notes (Signed)
7 Days Post-Op  Subjective: Looks ok In cardiac ICU  Tolerating diet   Objective: Vital signs in last 24 hours: Temp:  [97.6 F (36.4 C)-98.2 F (36.8 C)] 97.8 F (36.6 C) (01/07 0730) Pulse Rate:  [89-115] 100 (01/07 0730) Resp:  [20-32] 24 (01/07 0730) BP: (99-131)/(55-70) 129/68 mmHg (01/07 0730) SpO2:  [96 %-100 %] 98 % (01/07 0730) Last BM Date: 01/23/15  Intake/Output from previous day: 01/06 0701 - 01/07 0700 In: 1230 [P.O.:1130; IV Piggyback:100] Out: 2800 [Urine:1675; Drains:825; Stool:300] Intake/Output this shift:    Incision/Wound:vac  In place ostomy pink and viable  Appropriately tender  Lab Results:   Recent Labs  01/23/15 0335 01/24/15 0246  WBC 19.1* 14.0*  HGB 13.5 13.8  HCT 41.0 43.3  PLT 384 389   BMET  Recent Labs  01/23/15 0335 01/24/15 0246  NA 138 140  K 3.8 4.3  CL 96* 95*  CO2 35* 39*  GLUCOSE 164* 137*  BUN 13 11  CREATININE 0.86 0.74  CALCIUM 7.9* 8.2*   PT/INR  Recent Labs  01/23/15 0335 01/24/15 0246  LABPROT 37.8* 25.8*  INR 3.98* 2.39*   ABG No results for input(s): PHART, HCO3 in the last 72 hours.  Invalid input(s): PCO2, PO2  Studies/Results: Dg Chest Port 1 View  01/23/2015  CLINICAL DATA:  Status post exploratory laparotomy and subsequent colectomy and colostomy placement for perforated bowel secondary to diverticulitis; hypoxia, atrial fibrillation, acute and chronic CHF. EXAM: PORTABLE CHEST 1 VIEW COMPARISON:  Portable chest x-ray of January 21, 2015 FINDINGS: The lungs are mildly hypoinflated. The interstitial markings on the left have improved. The hemidiaphragms remain it obscured. The cardiac silhouette remains enlarged. The pulmonary vascularity is slightly less engorged centrally. The observed bony thorax is unremarkable. IMPRESSION: Interval improvement in the pulmonary interstitium consistent with decreasing interstitial edema. There is persistent cardiomegaly, pulmonary interstitial edema, and small  bilateral pleural effusions. Electronically Signed   By: David  Martinique M.D.   On: 01/23/2015 07:41    Anti-infectives: Anti-infectives    Start     Dose/Rate Route Frequency Ordered Stop   01/18/15 0400  levofloxacin (LEVAQUIN) IVPB 750 mg  Status:  Discontinued     750 mg 100 mL/hr over 90 Minutes Intravenous Every 24 hours 01/18/15 0210 01/21/15 1108   01/18/15 0215  meropenem (MERREM) 1 g in sodium chloride 0.9 % 100 mL IVPB     1 g 200 mL/hr over 30 Minutes Intravenous 3 times per day 01/18/15 0210     01/16/15 0930  ertapenem (INVANZ) 1 g in sodium chloride 0.9 % 50 mL IVPB  Status:  Discontinued     1 g 100 mL/hr over 30 Minutes Intravenous Every 24 hours 01/16/15 0841 01/18/15 0203   01/14/15 1245  piperacillin-tazobactam (ZOSYN) IVPB 3.375 g  Status:  Discontinued     3.375 g 12.5 mL/hr over 240 Minutes Intravenous 3 times per day 01/14/15 1238 01/16/15 0841   01/14/15 1230  ciprofloxacin (CIPRO) IVPB 400 mg  Status:  Discontinued     400 mg 200 mL/hr over 60 Minutes Intravenous  Once 01/14/15 1218 01/14/15 1238   01/14/15 1230  metroNIDAZOLE (FLAGYL) IVPB 500 mg  Status:  Discontinued     500 mg 100 mL/hr over 60 Minutes Intravenous  Once 01/14/15 1218 01/14/15 1238      Assessment/Plan: s/p Procedure(s): EXPLORATORY LAPAROTOMY WITH LEFT COLECTOMY AND COLOSTOMY (N/A) Home when medically able Will need HHN  Can D/C vac at discharge and switch to  wet to dry for home  PT /OT  WBC better   LOS: 10 days    Gwendola Hornaday A. 01/24/2015

## 2015-01-24 NOTE — Progress Notes (Signed)
MD notified pt c/o dizziness and sweating.  VSS. No temperature.  Pt recently received new dose of oxycotin. Ordered to continue to monitor pt for 1 hour before transferring

## 2015-01-24 NOTE — Progress Notes (Signed)
ANTICOAGULATION CONSULT NOTE - Follow Up Consult  Pharmacy Consult for Coumadin  Indication: atrial fibrillation   Patient Measurements: Height: 5\' 11"  (180.3 cm) Weight: (!) 311 lb 1.1 oz (141.1 kg) IBW/kg (Calculated) : 75.3 Heparin Dosing Weight:    Vital Signs: Temp: 97.5 F (36.4 C) (01/07 1200) Temp Source: Oral (01/07 1200) BP: 100/64 mmHg (01/07 1200) Pulse Rate: 89 (01/07 1200)  Labs:  Recent Labs  01/22/15 0355 01/22/15 0750 01/22/15 1645 01/23/15 0335 01/24/15 0246  HGB 11.8*  --   --  13.5 13.8  HCT 35.3*  --   --  41.0 43.3  PLT 328  --   --  384 389  LABPROT 21.6*  --   --  37.8* 25.8*  INR 1.89*  --   --  3.98* 2.39*  HEPARINUNFRC <0.10* 0.12* 0.13* 0.37  --   CREATININE 0.71  --   --  0.86 0.74    Estimated Creatinine Clearance: 164 mL/min (by C-G formula based on Cr of 0.74).   Assessment: Coumadin for Afib (Pradaxa PTA - last dose 12/28 @0700 ) - No baseline INR. INR 2.39. Heparin stopped 1/6. No coumadin yesterday d/t elevated INR.   Goal of Therapy:  INR 2-3  Monitor platelets by anticoagulation protocol: Yes   Plan:  Coumadin 5 mg PO x1 Daily INR Monitor s/sx bleeding  Joya San, PharmD Clinical Pharmacy Resident Pager # [phone] 01/24/2015 1:03 PM

## 2015-01-24 NOTE — Progress Notes (Signed)
Subjective:  Pt rates abd pain as 6/10. Denies shortness of breath but tachypneic due to abd pain. Refusing SNF, states his 48 y/o son can who he lives with will help him at home.   Objective: Vital signs in last 24 hours: Filed Vitals:   01/24/15 0500 01/24/15 0600 01/24/15 0730 01/24/15 0800  BP: 114/68 111/70 129/68 121/78  Pulse:   100   Temp:   97.8 F (36.6 C)   TempSrc:   Oral   Resp: 22 21 24 28   Height:      Weight:      SpO2: 99% 99% 98% 98%   Weight change:   Intake/Output Summary (Last 24 hours) at 01/24/15 1011 Last data filed at 01/24/15 0600  Gross per 24 hour  Intake   1330 ml  Output   2550 ml  Net  -1220 ml   Physical Exam General: NAD, laying in bed Cardiovascular: Irregularly, irregular rhythm, no m/r/g. Slightly tachycardic. Pulmonary: clear to anterior auscultation, tachypneic. No respiratory distress.  Abdominal: Colostomy bag in place. Midline surgical site w/ wound vac. Wound site clean and dry Extremities: No clubbing, cyanosis, or edema. Psychiatric: Anxious affect. Behavior normal.  Lab Results: Basic Metabolic Panel:  Recent Labs Lab 01/18/15 0030 01/19/15 0445  01/23/15 0335 01/24/15 0246  NA 136 139  < > 138 140  K 4.0 3.5  < > 3.8 4.3  CL 99* 104  < > 96* 95*  CO2 26 26  < > 35* 39*  GLUCOSE 196* 166*  < > 164* 137*  BUN 35* 52*  < > 13 11  CREATININE 1.81* 1.80*  < > 0.86 0.74  CALCIUM 7.1* 7.5*  < > 7.9* 8.2*  MG 1.4* 2.2  --   --   --   PHOS  --  3.9  --   --   --   < > = values in this interval not displayed. Liver Function Tests:  Recent Labs Lab 01/19/15 0445  AST 47*  ALT 26  ALKPHOS 67  BILITOT 1.1  PROT 4.9*  ALBUMIN 1.4*   CBC:  Recent Labs Lab 01/23/15 0335 01/24/15 0246  WBC 19.1* 14.0*  HGB 13.5 13.8  HCT 41.0 43.3  MCV 94.9 97.5  PLT 384 389    CBG:  Recent Labs Lab 01/21/15 0810 01/21/15 1128 01/21/15 1917 01/21/15 2332 01/22/15 0333 01/22/15 0732  GLUCAP 118* 125* 207* 135*  101* 102*   Fasting Lipid Panel:  Recent Labs Lab 01/17/15 1923  TRIG 156*   Coagulation:  Recent Labs Lab 01/22/15 0355 01/23/15 0335 01/24/15 0246  LABPROT 21.6* 37.8* 25.8*  INR 1.89* 3.98* 2.39*   Micro Results: Recent Results (from the past 240 hour(s))  Urine culture     Status: None   Collection Time: 01/14/15 11:33 AM  Result Value Ref Range Status   Specimen Description URINE, RANDOM  Final   Special Requests Normal  Final   Culture MULTIPLE SPECIES PRESENT, SUGGEST RECOLLECTION  Final   Report Status 01/16/2015 FINAL  Final  MRSA PCR Screening     Status: None   Collection Time: 01/14/15  6:05 PM  Result Value Ref Range Status   MRSA by PCR NEGATIVE NEGATIVE Final    Comment:        The GeneXpert MRSA Assay (FDA approved for NASAL specimens only), is one component of a comprehensive MRSA colonization surveillance program. It is not intended to diagnose MRSA infection nor to guide or monitor  treatment for MRSA infections.   Culture, blood (routine x 2)     Status: None   Collection Time: 01/18/15  3:34 AM  Result Value Ref Range Status   Specimen Description BLOOD RIGHT HAND  Final   Special Requests BOTTLES DRAWN AEROBIC ONLY 5CC  Final   Culture NO GROWTH 5 DAYS  Final   Report Status 01/23/2015 FINAL  Final  Culture, blood (routine x 2)     Status: None   Collection Time: 01/18/15  3:39 AM  Result Value Ref Range Status   Specimen Description BLOOD LEFT HAND  Final   Special Requests BOTTLES DRAWN AEROBIC AND ANAEROBIC 5CC   Final   Culture NO GROWTH 5 DAYS  Final   Report Status 01/23/2015 FINAL  Final   Studies/Results: Dg Chest Port 1 View  01/23/2015  CLINICAL DATA:  Status post exploratory laparotomy and subsequent colectomy and colostomy placement for perforated bowel secondary to diverticulitis; hypoxia, atrial fibrillation, acute and chronic CHF. EXAM: PORTABLE CHEST 1 VIEW COMPARISON:  Portable chest x-ray of January 21, 2015 FINDINGS:  The lungs are mildly hypoinflated. The interstitial markings on the left have improved. The hemidiaphragms remain it obscured. The cardiac silhouette remains enlarged. The pulmonary vascularity is slightly less engorged centrally. The observed bony thorax is unremarkable. IMPRESSION: Interval improvement in the pulmonary interstitium consistent with decreasing interstitial edema. There is persistent cardiomegaly, pulmonary interstitial edema, and small bilateral pleural effusions. Electronically Signed   By: David  Martinique M.D.   On: 01/23/2015 07:41   Medications: I have reviewed the patient's current medications. Scheduled Meds: . antiseptic oral rinse  7 mL Mouth Rinse BID  . carvedilol  37.5 mg Oral BID WC  . furosemide  20 mg Oral Daily  . meropenem (MERREM) IV  1 g Intravenous 3 times per day  . pantoprazole  40 mg Oral Q1200  . Warfarin - Pharmacist Dosing Inpatient   Does not apply q1800   Continuous Infusions: . 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  - Oxycodone 5-325 mg q4h prn pain changed to oxycontin 20mg  BID scheduled for pain- goal of relief of pain to 4/10. Continue Fentanyl 50-100 mcg q2h prn pain for breakthrough - Levaquin (1/1 - 1/4 discontinued), Meropenem 1 mg q8h (1/1--> ). Will get procalcitonin level to assist w/ abx duration. Leukocytosis is resolving.   Atrial Fibrillation with RVR: Rates have  been relatively well controlled today, with readings in the 90s and low 100s. Patient is on warfarin now, but on pradaxa at home. Consider switch to pradaxa prior to discharge. Cardiology is following - Carvedilol 37.5 mg BID - Warfarin per pharm, consider transition to pradaxa  Hypoxia: Pulmonary edema improving on CXR. Currently satting 98% on 2LNC.  - continue home lasix 20 mg daily - Wean O2 as tolerated - Atrovent and Xopenex nebulizer q2h prn  DVT prophylaxis: Warfarin  Dispo: Disposition is deferred at this time, awaiting improvement of current medical problems.  Anticipated discharge in approximately 1-2 day(s).  The patient does have a current PCP Thompson Grayer, MD) and does not need an [facility] hospital follow-up appointment after discharge. Will set pt up with [facility]MT[facility] clinic for follow up.   The patient does have transportation limitations that hinder transportation to clinic appointments.  .Services Needed at time of discharge: Y = Yes, Blank = No PT:   OT:   RN:   Equipment:   Other:     LOS: 10 days   Norman Herrlich, MD 01/24/2015, 10:11 AM p

## 2015-01-24 NOTE — Progress Notes (Addendum)
PT Profile  50 y.o. male with a past medical history significant for longstanding persistent atrial fibrillation, previous tachycardia induced cardiomyopathy, depression, and obesity. He has been followed by Dr Rayann Heman for EP care and Truitt Merle for cardiology. He failed TEE/DCCV in 11/2011. He was then placed on Sotalol and repeat DCCV was also unsuccessful. With obesity and severe LA enlargement, he was not felt to be a candidate for PVI and rate control was recommended. He has done relatively well from an AF standpoint but has declined OSA evaluation in the past and also has not taken ACE-I as recommended- admitted with abd pain underwent EXPLORATORY LAPAROTOMY WITH LEFT COLECTOMY AND COLOSTOMY 01/17/15.    Subjective: Patient mildly dyspneic. Denies chest pain but states he hurts all over.  Objective: Vital signs in last 24 hours: Temp:  [97.6 F (36.4 C)-98.2 F (36.8 C)] 97.8 F (36.6 C) (01/07 0730) Pulse Rate:  [89-111] 100 (01/07 0730) Resp:  [20-32] 28 (01/07 0800) BP: (99-131)/(55-78) 121/78 mmHg (01/07 0800) SpO2:  [96 %-100 %] 98 % (01/07 0800) Weight change:  Last BM Date: 01/23/15 Intake/Output from previous day: +474 (total since admit +3729) 01/06 0701 - 01/07 0700 In: 1330 [P.O.:1130; IV Piggyback:200] Out: 2800 [Urine:1675; Drains:825; Stool:300] Intake/Output this shift: Total I/O In: 120 [P.O.:120] Out: -   PE: General:WD/WN, mildly dyspneic HEENT:normal Neck: supple Heart:irregular, mildly tachycardic Lungs: diminished in bases Abd: s/p abdominal surgery; colostomy; mild tenderness [id number] edema Neuro: grossly intact Tele: a fib rate in 100-110   Lab Results:  Recent Labs  01/23/15 0335 01/24/15 0246  WBC 19.1* 14.0*  HGB 13.5 13.8  HCT 41.0 43.3  PLT 384 389   BMET  Recent Labs  01/23/15 0335 01/24/15 0246  NA 138 140  K 3.8 4.3  CL 96* 95*  CO2 35* 39*  GLUCOSE 164* 137*  BUN 13 11  CREATININE 0.86 0.74  CALCIUM  7.9* 8.2*     Lab Results  Component Value Date   CHOL 149 07/16/2013   HDL 46.20 07/16/2013   LDLCALC 87 07/16/2013   TRIG 156* 01/17/2015   CHOLHDL 3 07/16/2013   Lab Results  Component Value Date   HGBA1C 6.1* 01/15/2015     Lab Results  Component Value Date   TSH 2.14 07/16/2013    Studies/Results: ECHO 01/16/15 Study Conclusions  - Left ventricle: EF hard to judge due to rapid afib. Poor image quality abnormal septal motion The cavity size was normal. Wall thickness was normal. Systolic function was mildly reduced. The estimated ejection fraction was in the range of 45% to 50%. - Mitral valve: There was mild regurgitation. - Left atrium: The atrium was moderately dilated. - Atrial septum: No defect or patent foramen ovale was identified. - Pericardium, extracardiac: Small lateral pericardial effusion - Impressions: Consider TEE if clinically indicated. ? lipomatous hypertrophy of atrial septum cannot r/o mass in RA but suspect this is off axis artifact.  Impressions:  - Consider TEE if clinically indicated. ? lipomatous hypertrophy of atrial septum cannot r/o mass in RA but suspect this is off axis artifact.   Medications: I have reviewed the patient's current medications. Scheduled Meds: . antiseptic oral rinse  7 mL Mouth Rinse BID  . carvedilol  37.5 mg Oral BID WC  . furosemide  20 mg Oral Daily  . meropenem (MERREM) IV  1 g Intravenous 3 times per day  . oxyCODONE  20 mg Oral Q12H  . pantoprazole  40 mg Oral  Q1200  . Warfarin - Pharmacist Dosing Inpatient   Does not apply q1800   Continuous Infusions: . sodium chloride 10 mL/hr at 01/23/15 0700   PRN Meds:.fentaNYL (SUBLIMAZE) injection, ipratropium, levalbuterol, LORazepam, [DISCONTINUED] ondansetron **OR** ondansetron (ZOFRAN) IV  Assessment/Plan: Active Problems:   Diverticulitis of colon with perforation S/P expl lap and LEFT COLECTOMY AND COLOSTOMY 01/17/15-management per  general surgery    Atrial fibrillation with rapid ventricular response ([place])- permanent a fib; HR mildly elevated; will change coreg back to metoprolol at home dose -Continue coumadin; would transition back to NOAC following DC.    Systolic dysfunction with acute on chronic heart failure (HCC) -patient mildly volume overloaded; increase lasix to 40 mg daily; follow renal function.  - AKI with acute illness - now Cr at baseline  Kirk Ruths, MD, [facility] Attending Cardiologist [place]

## 2015-01-25 ENCOUNTER — Inpatient Hospital Stay (HOSPITAL_COMMUNITY): Payer: Medicaid Other

## 2015-01-25 DIAGNOSIS — E873 Alkalosis: Secondary | ICD-10-CM

## 2015-01-25 DIAGNOSIS — J9602 Acute respiratory failure with hypercapnia: Secondary | ICD-10-CM

## 2015-01-25 DIAGNOSIS — E8770 Fluid overload, unspecified: Secondary | ICD-10-CM

## 2015-01-25 LAB — BASIC METABOLIC PANEL
ANION GAP: 4 — AB (ref 5–15)
BUN: 8 mg/dL (ref 6–20)
CHLORIDE: 91 mmol/L — AB (ref 101–111)
CO2: 43 mmol/L — AB (ref 22–32)
Calcium: 8.3 mg/dL — ABNORMAL LOW (ref 8.9–10.3)
Creatinine, Ser: 0.7 mg/dL (ref 0.61–1.24)
GFR calc non Af Amer: >60 mL/min (ref >60)
Glucose, Bld: 154 mg/dL — ABNORMAL HIGH (ref 65–99)
POTASSIUM: 4.7 mmol/L (ref 3.5–5.1)
Sodium: 138 mmol/L (ref 135–145)

## 2015-01-25 LAB — BLOOD GAS, ARTERIAL
ACID-BASE EXCESS: 15.3 mmol/L — AB (ref 0.0–2.0)
ACID-BASE EXCESS: 19.9 mmol/L — AB (ref 0.0–2.0)
Acid-Base Excess: 18.4 mmol/L — ABNORMAL HIGH (ref 0.0–2.0)
Bicarbonate: 42.7 mEq/L — ABNORMAL HIGH (ref 20.0–24.0)
Bicarbonate: 44.1 mEq/L — ABNORMAL HIGH (ref 20.0–24.0)
Bicarbonate: 45.6 mEq/L — ABNORMAL HIGH (ref 20.0–24.0)
DRAWN BY: 347621
Drawn by: 23604
Drawn by: 105521
O2 CONTENT: 4 L/min
O2 Content: 4 L/min
O2 Content: 5 L/min
O2 SAT: 89.9 %
O2 SAT: 93.1 %
O2 Saturation: 95.4 %
PATIENT TEMPERATURE: 98.6
PATIENT TEMPERATURE: 98.6
PATIENT TEMPERATURE: 98.6
PO2 ART: 60.1 mmHg — AB (ref 80.0–100.0)
PO2 ART: 60.9 mmHg — AB (ref 80.0–100.0)
TCO2: 45.6 mmol/L (ref 0–100)
TCO2: 46.2 mmol/L (ref 0–100)
TCO2: 47.7 mmol/L (ref 0–100)
pCO2 arterial: 97.2 mmHg (ref 35.0–45.0)
pCO2 arterial: 68.5 mmHg (ref 35.0–45.0)
pCO2 arterial: 68.1 mmHg (ref 35.0–45.0)
pH, Arterial: 7.265 — ABNORMAL LOW (ref 7.350–7.450)
pH, Arterial: 7.424 (ref 7.350–7.450)
pH, Arterial: 7.441 (ref 7.350–7.450)
pO2, Arterial: 71.8 mmHg — ABNORMAL LOW (ref 80.0–100.0)

## 2015-01-25 LAB — PROTIME-INR
INR: 2.61 — ABNORMAL HIGH (ref 0.00–1.49)
Prothrombin Time: 27.5 seconds — ABNORMAL HIGH (ref 11.6–15.2)

## 2015-01-25 LAB — CBC
HCT: 43.8 % (ref 39.0–52.0)
HEMOGLOBIN: 13.4 g/dL (ref 13.0–17.0)
MCH: 31.1 pg (ref 26.0–34.0)
MCHC: 30.6 g/dL (ref 30.0–36.0)
MCV: 101.6 fL — AB (ref 78.0–100.0)
Platelets: 433 10*3/uL — ABNORMAL HIGH (ref 150–400)
RBC: 4.31 MIL/uL (ref 4.22–5.81)
RDW: 16.4 % — ABNORMAL HIGH (ref 11.5–15.5)
WBC: 13.4 10*3/uL — ABNORMAL HIGH (ref 4.0–10.5)

## 2015-01-25 MED ORDER — METOPROLOL TARTRATE 25 MG PO TABS
75.0000 mg | ORAL_TABLET | Freq: Two times a day (BID) | ORAL | Status: DC
  Administered 2015-01-25 (×2): 75 mg via ORAL
  Filled 2015-01-25: qty 1

## 2015-01-25 MED ORDER — FUROSEMIDE 10 MG/ML IJ SOLN
40.0000 mg | Freq: Once | INTRAMUSCULAR | Status: AC
  Administered 2015-01-25: 40 mg via INTRAVENOUS
  Filled 2015-01-25: qty 4

## 2015-01-25 MED ORDER — WARFARIN SODIUM 5 MG PO TABS
5.0000 mg | ORAL_TABLET | Freq: Once | ORAL | Status: AC
  Administered 2015-01-25: 5 mg via ORAL
  Filled 2015-01-25: qty 1

## 2015-01-25 MED ORDER — METOPROLOL TARTRATE 100 MG PO TABS: 100.0000 mg | ORAL_TABLET | Freq: Two times a day (BID) | ORAL | Status: DC

## 2015-01-25 MED ORDER — METOPROLOL TARTRATE 25 MG PO TABS
25.0000 mg | ORAL_TABLET | Freq: Once | ORAL | Status: AC
  Administered 2015-01-25: 25 mg via ORAL
  Filled 2015-01-25: qty 1

## 2015-01-25 NOTE — Progress Notes (Signed)
ANTICOAGULATION CONSULT NOTE - Follow Up Consult  Pharmacy Consult for Coumadin  Indication: atrial fibrillation   Patient Measurements: Height: 5\' 10"  (177.8 cm) Weight: (!) 311 lb 1.1 oz (141.1 kg) IBW/kg (Calculated) : 73 Heparin Dosing Weight:    Vital Signs: Temp: 97.5 F (36.4 C) (01/08 0434) Temp Source: Oral (01/08 0434) BP: 134/73 mmHg (01/08 0929) Pulse Rate: 108 (01/08 0929)  Labs:  Recent Labs  01/22/15 1645  01/23/15 0335 01/24/15 0246 01/25/15 0500  HGB  --   < > 13.5 13.8 13.4  HCT  --   --  41.0 43.3 43.8  PLT  --   --  384 389 433*  LABPROT  --   --  37.8* 25.8* 27.5*  INR  --   --  3.98* 2.39* 2.61*  HEPARINUNFRC 0.13*  --  0.37  --   --   CREATININE  --   --  0.86 0.74 0.70  < > = values in this interval not displayed.  Estimated Creatinine Clearance: 161.8 mL/min (by C-G formula based on Cr of 0.7).   Assessment: Coumadin for Afib (Pradaxa PTA - last dose 12/28 @0700 ) - No baseline INR. INR 2.61, has been a bit labile.  No bleeding or complications noted, CBC stable.   Goal of Therapy:  INR 2-3  Monitor platelets by anticoagulation protocol: Yes   Plan:  Coumadin 5 mg PO x1 Daily INR Monitor s/sx bleeding  Uvaldo Rising, BCPS  Clinical Pharmacist Pager [phone]  01/25/2015 10:35 AM

## 2015-01-25 NOTE — Progress Notes (Signed)
Second text page sent to Cardiology with patient's blood gas results, awaiting call back.

## 2015-01-25 NOTE — Plan of Care (Signed)
Problem: Pain Management: Goal: Pain level will decrease Outcome: Progressing After patient has been medicated for pain, RN into re-assess and patient sleeping.

## 2015-01-25 NOTE — Progress Notes (Signed)
RN text paged Cardiology with arterial blood gas results.

## 2015-01-25 NOTE — Progress Notes (Addendum)
Patient's heart rate has been elevated 110's to 140's all day Dr.  Hulen Luster aware.  I called Loleta Chance, MD about heart rates he wrote order for a one time dose of metoprolol 50 mg.  Will continue to monitor.

## 2015-01-25 NOTE — Progress Notes (Addendum)
Subjective:  Pt rates abd pain as 6/10.   Overnight pt had respirations in the 30's and SOB. CXR revealed worsening pulmonary edema, and ABG revealed a respiratory acidosis. He was given lasix IV 40mg  once with improvement of SOB this am.   Elevated HRs overnight, this morning in the 140's then on re examination around 100-107.   Objective: Vital signs in last 24 hours: Filed Vitals:   01/24/15 2221 01/25/15 0434 01/25/15 0450 01/25/15 0929  BP:  139/66  134/73  Pulse:  104  108  Temp:  97.5 F (36.4 C)    TempSrc:  Oral    Resp:  30    Height:      Weight:      SpO2: 95% 94% 96%    Weight change:   Intake/Output Summary (Last 24 hours) at 01/25/15 1139 Last data filed at 01/25/15 0656  Gross per 24 hour  Intake   1340 ml  Output   3450 ml  Net  -2110 ml   Physical Exam General: NAD, laying in bed Cardiovascular: Irregularly, irregular rhythm, no m/r/g. Slightly tachycardic. Pulmonary: clear to anterior auscultation, tachypneic. No crackles, decreased breath sounds on RLL Abdominal: Colostomy bag in place. Midline surgical site w/ wound vac. Wound site clean and dry Extremities: No clubbing, cyanosis, or edema. Psychiatric: Anxious affect. Behavior normal.  Lab Results: Basic Metabolic Panel:  Recent Labs Lab 01/19/15 0445  01/24/15 0246 01/25/15 0500  NA 139  < > 140 138  K 3.5  < > 4.3 4.7  CL 104  < > 95* 91*  CO2 26  < > 39* 43*  GLUCOSE 166*  < > 137* 154*  BUN 52*  < > 11 8  CREATININE 1.80*  < > 0.74 0.70  CALCIUM 7.5*  < > 8.2* 8.3*  MG 2.2  --   --   --   PHOS 3.9  --   --   --   < > = values in this interval not displayed. Liver Function Tests:  Recent Labs Lab 01/19/15 0445  AST 47*  ALT 26  ALKPHOS 67  BILITOT 1.1  PROT 4.9*  ALBUMIN 1.4*   CBC:  Recent Labs Lab 01/24/15 0246 01/25/15 0500  WBC 14.0* 13.4*  HGB 13.8 13.4  HCT 43.3 43.8  MCV 97.5 101.6*  PLT 389 433*    CBG:  Recent Labs Lab 01/21/15 1128  01/21/15 1917 01/21/15 2332 01/22/15 0333 01/22/15 0732 01/24/15 1208  GLUCAP 125* 207* 135* 101* 102* 186*   Fasting Lipid Panel: No results for input(s): CHOL, HDL, LDLCALC, TRIG, CHOLHDL, LDLDIRECT in the last 168 hours. Coagulation:  Recent Labs Lab 01/22/15 0355 01/23/15 0335 01/24/15 0246 01/25/15 0500  LABPROT 21.6* 37.8* 25.8* 27.5*  INR 1.89* 3.98* 2.39* 2.61*   Micro Results: Recent Results (from the past 240 hour(s))  Culture, blood (routine x 2)     Status: None   Collection Time: 01/18/15  3:34 AM  Result Value Ref Range Status   Specimen Description BLOOD RIGHT HAND  Final   Special Requests BOTTLES DRAWN AEROBIC ONLY 5CC  Final   Culture NO GROWTH 5 DAYS  Final   Report Status 01/23/2015 FINAL  Final  Culture, blood (routine x 2)     Status: None   Collection Time: 01/18/15  3:39 AM  Result Value Ref Range Status   Specimen Description BLOOD LEFT HAND  Final   Special Requests BOTTLES DRAWN AEROBIC AND ANAEROBIC 5CC  Final   Culture NO GROWTH 5 DAYS  Final   Report Status 01/23/2015 FINAL  Final   Studies/Results: Dg Chest Port 1 View  01/25/2015  CLINICAL DATA:  Acute onset of shortness of breath. EXAM: PORTABLE CHEST 1 VIEW COMPARISON:  Chest radiograph performed 01/23/2015 FINDINGS: Bilateral central airspace opacification is noted, with underlying vascular congestion and small to moderate bilateral pleural effusions, compatible with pulmonary edema. This appears worsened from the prior study. No pneumothorax is seen. The cardiomediastinal silhouette is enlarged. No acute osseous abnormalities are identified. IMPRESSION: Worsening pulmonary edema, with small to moderate bilateral pleural effusions. Underlying vascular congestion and cardiomegaly. Electronically Signed   By: Garald Balding M.D.   On: 01/25/2015 06:01   Medications: I have reviewed the patient's current medications. Scheduled Meds: . antiseptic oral rinse  7 mL Mouth Rinse BID  .  furosemide  40 mg Oral Daily  . meropenem (MERREM) IV  1 g Intravenous 3 times per day  . metoprolol tartrate  75 mg Oral BID  . oxyCODONE  20 mg Oral Q12H  . pantoprazole  40 mg Oral Q1200  . warfarin  5 mg Oral ONCE-1800  . Warfarin - Pharmacist Dosing Inpatient   Does not apply q1800   Continuous Infusions: . sodium chloride 10 mL/hr at 01/23/15 0700   PRN Meds:.ipratropium, levalbuterol, LORazepam, [DISCONTINUED] ondansetron **OR** ondansetron (ZOFRAN) IV, oxyCODONE Assessment/Plan: Active Problems:   Diverticulitis of colon with perforation   Hypoxia   Atrial fibrillation with rapid ventricular response (HCC)   Systolic dysfunction with acute on chronic heart failure (HCC)   Perforated bowel (HCC)   Respiratory failure (HCC)   Dyspnea   Pleural effusion  Perforated Bowel s/p left colectomy and colostomy: wound vac in place, gen sx recommend d/c vac on discharge and wet to dry dressings at home.  - oxycontin 20mg  BID scheduled for pain- goal of relief of pain to 4/10. Oxycodone 5mg  q4h prn for breakthrough pain.  - Levaquin (1/1 - 1/4 discontinued), Meropenem 1 mg q8h (1/1--> ). Leukocytosis is resolving. Pro calcitonin negative.   Atrial Fibrillation with RVR: Rates have been relatively well controlled today, with readings in the 90s and low 100s. Patient is on warfarin now, but on pradaxa at home.  Cardiology is following - on home lopressor 75mg  BID, HRs in the 140's this morning prior to receiving morning BB but then back down to 100-107 on re examination. Can consider increasing lopressor to 100mg  if HRs persistently elevated.  - Warfarin per pharm,  transition to pradaxa on d/c  Hypoxia: ABG this morning improved from earlier w/ normalization of pH.  - lasix 40mg  daily - Wean O2 as tolerated - Atrovent and Xopenex nebulizer q2h prn  DVT prophylaxis: Warfarin  Dispo: Disposition is deferred at this time, awaiting improvement of current medical problems.  Anticipated  discharge in approximately 1-2 day(s).   The patient does have a current PCP Thompson Grayer, MD) and does not need an [facility] hospital follow-up appointment after discharge. Will set pt up with IMTS clinic for follow up.   The patient does have transportation limitations that hinder transportation to clinic appointments.  .Services Needed at time of discharge: Y = Yes, Blank = No PT:   OT:   RN:   Equipment:   Other:     LOS: 11 days   Norman Herrlich, MD 01/25/2015, 11:39 AM p

## 2015-01-25 NOTE — Progress Notes (Addendum)
Patient removed his Bipap, stated he was not short of breath.  Patient refused to have Bipap replaced.  MD Hulen Luster notified.

## 2015-01-25 NOTE — Progress Notes (Signed)
ABG obtained on patient. Spoke to patient about wearing BIPAP if needed. Patient refused, stating he was not going to wear BIPAP.  Explained to patient the importance of wearing BIPAP. Patient states his breathing is fine and he does not need it.

## 2015-01-25 NOTE — Progress Notes (Signed)
ABG done.  Ph normal at 7.42  CO2 high at 68.5.  Went in to pt to put bipap back on but refused to wear.    Reported to nurse.

## 2015-01-25 NOTE — Progress Notes (Signed)
Patient ID: William Fischer, male   DOB: 1965-10-31, 50 y.o.   MRN: [id number] 8 Days Post-Op  Subjective: Tolerating diet, but poor appetite.    Objective: Vital signs in last 24 hours: Temp:  [97.5 F (36.4 C)-97.9 F (36.6 C)] 97.5 F (36.4 C) (01/08 0434) Pulse Rate:  [89-108] 108 (01/08 0929) Resp:  [24-30] 30 (01/08 0434) BP: (98-139)/(53-83) 134/73 mmHg (01/08 0929) SpO2:  [94 %-100 %] 96 % (01/08 0450) Last BM Date: 01/24/15  Intake/Output from previous day: 01/07 0701 - 01/08 0700 In: 1460 [P.O.:1160; IV Piggyback:300] Out: 3750 [Urine:3050; Drains:700] Intake/Output this shift:    Incision/Wound:vac  In place ostomy pink and viable  Appropriately tender Abd non distended.    Lab Results:   Recent Labs  01/24/15 0246 01/25/15 0500  WBC 14.0* 13.4*  HGB 13.8 13.4  HCT 43.3 43.8  PLT 389 433*   BMET  Recent Labs  01/24/15 0246 01/25/15 0500  NA 140 138  K 4.3 4.7  CL 95* 91*  CO2 39* 43*  GLUCOSE 137* 154*  BUN 11 8  CREATININE 0.74 0.70  CALCIUM 8.2* 8.3*   PT/INR  Recent Labs  01/24/15 0246 01/25/15 0500  LABPROT 25.8* 27.5*  INR 2.39* 2.61*   ABG  Recent Labs  01/25/15 0515  PHART 7.265*  HCO3 42.7*    Studies/Results: Dg Chest Port 1 View  01/25/2015  CLINICAL DATA:  Acute onset of shortness of breath. EXAM: PORTABLE CHEST 1 VIEW COMPARISON:  Chest radiograph performed 01/23/2015 FINDINGS: Bilateral central airspace opacification is noted, with underlying vascular congestion and small to moderate bilateral pleural effusions, compatible with pulmonary edema. This appears worsened from the prior study. No pneumothorax is seen. The cardiomediastinal silhouette is enlarged. No acute osseous abnormalities are identified. IMPRESSION: Worsening pulmonary edema, with small to moderate bilateral pleural effusions. Underlying vascular congestion and cardiomegaly. Electronically Signed   By: Garald Balding M.D.   On: 01/25/2015 06:01     Anti-infectives: Anti-infectives    Start     Dose/Rate Route Frequency Ordered Stop   01/18/15 0400  levofloxacin (LEVAQUIN) IVPB 750 mg  Status:  Discontinued     750 mg 100 mL/hr over 90 Minutes Intravenous Every 24 hours 01/18/15 0210 01/21/15 1108   01/18/15 0215  meropenem (MERREM) 1 g in sodium chloride 0.9 % 100 mL IVPB     1 g 200 mL/hr over 30 Minutes Intravenous 3 times per day 01/18/15 0210     01/16/15 0930  ertapenem (INVANZ) 1 g in sodium chloride 0.9 % 50 mL IVPB  Status:  Discontinued     1 g 100 mL/hr over 30 Minutes Intravenous Every 24 hours 01/16/15 0841 01/18/15 0203   01/14/15 1245  piperacillin-tazobactam (ZOSYN) IVPB 3.375 g  Status:  Discontinued     3.375 g 12.5 mL/hr over 240 Minutes Intravenous 3 times per day 01/14/15 1238 01/16/15 0841   01/14/15 1230  ciprofloxacin (CIPRO) IVPB 400 mg  Status:  Discontinued     400 mg 200 mL/hr over 60 Minutes Intravenous  Once 01/14/15 1218 01/14/15 1238   01/14/15 1230  metroNIDAZOLE (FLAGYL) IVPB 500 mg  Status:  Discontinued     500 mg 100 mL/hr over 60 Minutes Intravenous  Once 01/14/15 1218 01/14/15 1238      Assessment/Plan: s/p Procedure(s): EXPLORATORY LAPAROTOMY WITH LEFT COLECTOMY AND COLOSTOMY (N/A) Home when medically able Will need HHN  Can D/C vac at discharge and switch to wet to dry for home  PT /OT  WBC continues to go down slowly.    LOS: 11 days    William Fischer 01/25/2015

## 2015-01-25 NOTE — Progress Notes (Signed)
Subjective:  No complaints of shortness of breath or chest pain  Objective:  Vital Signs in the last 24 hours: BP 134/73 mmHg  Pulse 108  Temp(Src) 97.5 F (36.4 C) (Oral)  Resp 30  Ht 5\' 10"  (1.778 m)  Wt 141.1 kg (311 lb 1.1 oz)  BMI 43.40 kg/m2  SpO2 96%  Physical Exam: Pleasant male in no acute distress Lungs:  Clear  Cardiac:  Irregular, normal S1 and S2, no S3 Abdomen:  Colostomy bag noted, midline surgical scar dry Extremities:  No edema present  Intake/Output from previous day: 01/07 0701 - 01/08 0700 In: 1460 [P.O.:1160; IV Piggyback:300] Out: 3750 [Urine:3050; Drains:700] Weight Filed Weights   01/20/15 0430 01/21/15 0412 01/23/15 0600  Weight: 137.44 kg (303 lb) 141.9 kg (312 lb 13.3 oz) 141.1 kg (311 lb 1.1 oz)   Lab Results: Basic Metabolic Panel:  Recent Labs  01/24/15 0246 01/25/15 0500  NA 140 138  K 4.3 4.7  CL 95* 91*  CO2 39* 43*  GLUCOSE 137* 154*  BUN 11 8  CREATININE 0.74 0.70    CBC:  Recent Labs  01/24/15 0246 01/25/15 0500  WBC 14.0* 13.4*  HGB 13.8 13.4  HCT 43.3 43.8  MCV 97.5 101.6*  PLT 389 433*   PROTIME: Lab Results  Component Value Date   INR 2.61* 01/25/2015   INR 2.39* 01/24/2015   INR 3.98* 01/23/2015   Telemetry: Atrial fibrillation  Assessment/Plan:  1.  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  Atrial fibrillation rate up her limits of normal but recently well controlled 2.  Acute on chronic heart failure but appears stable today 3.  History of acute renal failure but creatinine back to baseline  Recommendations:  Continue current medicines and anticoagulation.      Kerry Hough  MD [facility] Cardiology  01/25/2015, 10:24 AM

## 2015-01-25 NOTE — Progress Notes (Addendum)
Patient had liquid brown stool with blood.  Patient care preformed.  Dr. Barry Dienes notified, she stated this is to be expected.  Will continue to monitor.

## 2015-01-25 NOTE — Progress Notes (Signed)
Patient called RN to room.  RN into room, respirations at 30 per minute.  02 94% on 4L nasal cannula.  Patient stated he was feeling short of breath while resting in bed.  Respiratory paged and asked to administer PRN breathing treatment.  Cardiology paged, returned page and updated.  Orders entered per Dr. Wandra Mannan.

## 2015-01-25 NOTE — Progress Notes (Signed)
Patient seen and examined. Case d/w residents in detail. I agree with findings and plan as documeted in Dr. Caron Presume note.  Patient was admitted with acute abdominal pain secondary to perforated diverticulitis now s/p left colectomy and colostomy. Overnight he was noted to be tachycardic and SOB. ABG with hypercarbic resp failure and CXR with worsening fluid overload. He was given lasix 40 mg IV * 1 and was negative 2.29 litres over the course of 24 hours. He is much improved today. Repeat ABG with mild resp alkalosis. Would hold off on further BIPAP for now. Will check repeat ABG in AM. Cardio f/u appreciated. C/w po lasix 40 mg daily.  Afib now better controlled. Will c/w home lopressor dose. D/c home on PO pradaxa when ready for discharge.

## 2015-01-25 NOTE — Progress Notes (Signed)
Patient able to answer orientation questions appropriately at this time but then asked nurse if he was in a boat.  RN stated no, you are in a hospital bed.

## 2015-01-25 NOTE — Progress Notes (Signed)
Spoke to patients family(sisters, mother, and sons) at the bedside with the patient about their concerns with taking the patient home at discharge.  Patient is now in agreement with going to SNF at discharge.  Family is requesting a SNF near [place], [place].

## 2015-01-25 NOTE — Progress Notes (Signed)
William Fischer on call with Internal Medicine Residency text paged through [facility] with patient's arterial blood gas results.

## 2015-01-26 ENCOUNTER — Inpatient Hospital Stay (HOSPITAL_COMMUNITY): Payer: Medicaid Other

## 2015-01-26 DIAGNOSIS — J810 Acute pulmonary edema: Secondary | ICD-10-CM

## 2015-01-26 DIAGNOSIS — J90 Pleural effusion, not elsewhere classified: Secondary | ICD-10-CM

## 2015-01-26 DIAGNOSIS — L8990 Pressure ulcer of unspecified site, unspecified stage: Secondary | ICD-10-CM | POA: Insufficient documentation

## 2015-01-26 DIAGNOSIS — J811 Chronic pulmonary edema: Secondary | ICD-10-CM | POA: Insufficient documentation

## 2015-01-26 LAB — PROTIME-INR
INR: 5.93 — AB (ref 0.00–1.49)
PROTHROMBIN TIME: 51.1 s — AB (ref 11.6–15.2)

## 2015-01-26 LAB — CBC
HCT: 39.8 % (ref 39.0–52.0)
Hemoglobin: 12.6 g/dL — ABNORMAL LOW (ref 13.0–17.0)
MCH: 31.4 pg (ref 26.0–34.0)
MCHC: 31.7 g/dL (ref 30.0–36.0)
MCV: 99.3 fL (ref 78.0–100.0)
PLATELETS: 420 10*3/uL — AB (ref 150–400)
RBC: 4.01 MIL/uL — ABNORMAL LOW (ref 4.22–5.81)
RDW: 16.1 % — AB (ref 11.5–15.5)
WBC: 9.7 10*3/uL (ref 4.0–10.5)

## 2015-01-26 LAB — BLOOD GAS, ARTERIAL
ACID-BASE EXCESS: 22.5 mmol/L — AB (ref 0.0–2.0)
BICARBONATE: 48.2 meq/L — AB (ref 20.0–24.0)
Drawn by: 331761
O2 CONTENT: 3 L/min
O2 Saturation: 88.6 %
PATIENT TEMPERATURE: 98.3
PCO2 ART: 65.5 mmHg — AB (ref 35.0–45.0)
PO2 ART: 52.6 mmHg — AB (ref 80.0–100.0)
TCO2: 50.2 mmol/L (ref 0–100)
pH, Arterial: 7.479 — ABNORMAL HIGH (ref 7.350–7.450)

## 2015-01-26 LAB — BASIC METABOLIC PANEL
Anion gap: 7 (ref 5–15)
BUN: 11 mg/dL (ref 6–20)
CALCIUM: 8.2 mg/dL — AB (ref 8.9–10.3)
CHLORIDE: 88 mmol/L — AB (ref 101–111)
CO2: 44 mmol/L — AB (ref 22–32)
Creatinine, Ser: 0.71 mg/dL (ref 0.61–1.24)
GFR calc non Af Amer: >60 mL/min (ref >60)
Glucose, Bld: 145 mg/dL — ABNORMAL HIGH (ref 65–99)
Potassium: 4.3 mmol/L (ref 3.5–5.1)
Sodium: 139 mmol/L (ref 135–145)

## 2015-01-26 LAB — PROCALCITONIN: Procalcitonin: <0.1 ng/mL

## 2015-01-26 LAB — BRAIN NATRIURETIC PEPTIDE: B NATRIURETIC PEPTIDE 5: 301.9 pg/mL — AB (ref 0.0–100.0)

## 2015-01-26 MED ORDER — METOPROLOL TARTRATE 1 MG/ML IV SOLN
5.0000 mg | Freq: Once | INTRAVENOUS | Status: AC
  Administered 2015-01-26: 5 mg via INTRAVENOUS
  Filled 2015-01-26: qty 5

## 2015-01-26 MED ORDER — METOPROLOL TARTRATE 100 MG PO TABS
100.0000 mg | ORAL_TABLET | Freq: Two times a day (BID) | ORAL | Status: DC
  Administered 2015-01-26 – 2015-01-27 (×2): 100 mg via ORAL
  Filled 2015-01-26 (×2): qty 1

## 2015-01-26 MED ORDER — DILTIAZEM LOAD VIA INFUSION
10.0000 mg | Freq: Once | INTRAVENOUS | Status: AC
  Administered 2015-01-26: 10 mg via INTRAVENOUS
  Filled 2015-01-26: qty 10

## 2015-01-26 MED ORDER — DILTIAZEM HCL 100 MG IV SOLR
5.0000 mg/h | INTRAVENOUS | Status: DC
  Administered 2015-01-26: 5 mg/h via INTRAVENOUS
  Filled 2015-01-26 (×3): qty 100

## 2015-01-26 MED ORDER — BOOST / RESOURCE BREEZE PO LIQD
1.0000 | Freq: Three times a day (TID) | ORAL | Status: DC
  Administered 2015-01-26 – 2015-02-02 (×18): 1 via ORAL

## 2015-01-26 MED ORDER — GERHARDT'S BUTT CREAM
TOPICAL_CREAM | Freq: Every day | CUTANEOUS | Status: DC
  Administered 2015-01-26 – 2015-01-29 (×4): via TOPICAL
  Administered 2015-01-30: 1 via TOPICAL
  Administered 2015-01-31 – 2015-02-02 (×3): via TOPICAL
  Filled 2015-01-26 (×3): qty 1

## 2015-01-26 MED ORDER — DIGOXIN 0.25 MG/ML IJ SOLN
0.2500 mg | Freq: Two times a day (BID) | INTRAMUSCULAR | Status: AC
  Administered 2015-01-26 (×2): 0.25 mg via INTRAVENOUS
  Filled 2015-01-26 (×2): qty 1

## 2015-01-26 MED ORDER — FUROSEMIDE 10 MG/ML IJ SOLN
80.0000 mg | Freq: Two times a day (BID) | INTRAMUSCULAR | Status: DC
  Administered 2015-01-26 – 2015-01-27 (×3): 80 mg via INTRAVENOUS
  Filled 2015-01-26 (×3): qty 8

## 2015-01-26 MED ORDER — METOPROLOL TARTRATE 50 MG PO TABS
75.0000 mg | ORAL_TABLET | Freq: Two times a day (BID) | ORAL | Status: DC
  Administered 2015-01-26: 75 mg via ORAL
  Filled 2015-01-26: qty 1

## 2015-01-26 NOTE — Progress Notes (Signed)
RN spoke with Dr. Gwenlyn Fudge pertaining to patient being oriented x4 but then asking RN if he was in a boat.  Order entered per Dr. Merrilyn Puma.

## 2015-01-26 NOTE — Progress Notes (Signed)
Physical Therapy Treatment Patient Details Name: William Fischer MRN: [id number] DOB: 11-18-1965 Today's Date: 01/26/2015    History of Present Illness pt is a 50 y/o male with h/o afib, HF, Cardiomyopathy, obesity, admitted with sudden onset of sever abdominal pain found to be due to sigmoid divertular perforation, s/p colectomy and colostomy on 12/31.  Extubated 1/1.    PT Comments    Treatment limited due to labile HR that RN stated was starting to get under control with meds.  Aborted gait and up in chair for dinner.  Follow Up Recommendations  Home health PT     Equipment Recommendations  None recommended by PT    Recommendations for Other Services       Precautions / Restrictions Precautions Precautions: Fall    Mobility  Bed Mobility Overal bed mobility: Needs Assistance Bed Mobility: Supine to Sit;Sit to Supine;Rolling Rolling: Supervision   Supine to sit: Min assist Sit to supine: Min assist   General bed mobility comments: limited session due to RN stated after EOB that HR has been labile today.  Transfers                    Ambulation/Gait             General Gait Details: unable due to RVR   Stairs            Wheelchair Mobility    Modified Rankin (Stroke Patients Only)       Balance   Sitting-balance support: No upper extremity supported;Bilateral upper extremity supported Sitting balance-Leahy Scale: Good                              Cognition Arousal/Alertness: Awake/alert Behavior During Therapy: WFL for tasks assessed/performed Overall Cognitive Status: Within Functional Limits for tasks assessed                      Exercises      General Comments General comments (skin integrity, edema, etc.): HR 120's getting to EOB, gait and to the chair aborted..  Assisted pt getting other Heart healthy food he wanted to eat and position for dinner.      Pertinent Vitals/Pain Pain Assessment:  Faces Faces Pain Scale: Hurts little more Pain Location: abdomen Pain Descriptors / Indicators: Discomfort;Grimacing Pain Intervention(s): Monitored during session    Home Living                      Prior Function            PT Goals (current goals can now be found in the care plan section) Acute Rehab PT Goals PT Goal Formulation: With patient Time For Goal Achievement: 02/05/15 Potential to Achieve Goals: Good Progress towards PT goals: Not progressing toward goals - comment (HR limiting session)    Frequency  Min 3X/week    PT Plan Current plan remains appropriate    Co-evaluation             End of Session   Activity Tolerance: Other (comment) (pt's HR started to rise at EOB, RN trying to decrease HR.) Patient left: in bed;with call bell/phone within reach     Time: 1715-1735 PT Time Calculation (min) (ACUTE ONLY): 20 min  Charges:  $Therapeutic Activity: 8-22 mins                    G Codes:  Frankee Gritz, Tessie Fass 01/26/2015, 5:51 PM 01/26/2015  Donnella Sham, PT [phone]  (pager)

## 2015-01-26 NOTE — Progress Notes (Signed)
Subjective:  Patient endorses some persistent shortness of breath and intermittently has been needing BiPAP which has led to improvement in his symptoms. He has has been tachycardic, sometimes into the 160s. He denies any chest pain or abdominal pain.   Objective: Vital signs in last 24 hours: Filed Vitals:   01/26/15 1000 01/26/15 1100 01/26/15 1141 01/26/15 1421  BP:    117/67  Pulse:    113  Temp:    98.3 F (36.8 C)  TempSrc:    Oral  Resp:    20  Height:      Weight:      SpO2: 99% 99% 92% 90%   Weight change:   Intake/Output Summary (Last 24 hours) at 01/26/15 1505 Last data filed at 01/26/15 1423  Gross per 24 hour  Intake   1600 ml  Output   4150 ml  Net  -2550 ml   Physical Exam General: NAD, laying in bed Cardiovascular: Irregularly, irregular rhythm, no m/r/g. Tachycardic. Pulmonary: clear to anterior auscultation, tachypneic. Fine crackles on exam Abdominal: Colostomy bag in place. Midline surgical site w/ wound vac. Wound site clean and dry Extremities: No clubbing, cyanosis, or edema. Psychiatric: Anxious affect. Behavior normal.  Lab Results: Basic Metabolic Panel:  Recent Labs Lab 01/25/15 0500 01/26/15 0432  NA 138 139  K 4.7 4.3  CL 91* 88*  CO2 43* 44*  GLUCOSE 154* 145*  BUN 8 11  CREATININE 0.70 0.71  CALCIUM 8.3* 8.2*   CBC:  Recent Labs Lab 01/25/15 0500 01/26/15 0432  WBC 13.4* 9.7  HGB 13.4 12.6*  HCT 43.8 39.8  MCV 101.6* 99.3  PLT 433* 420*    CBG:  Recent Labs Lab 01/21/15 1128 01/21/15 1917 01/21/15 2332 01/22/15 0333 01/22/15 0732 01/24/15 1208  GLUCAP 125* 207* 135* 101* 102* 186*   Fasting Lipid Panel: No results for input(s): CHOL, HDL, LDLCALC, TRIG, CHOLHDL, LDLDIRECT in the last 168 hours. Coagulation:  Recent Labs Lab 01/23/15 0335 01/24/15 0246 01/25/15 0500 01/26/15 0432  LABPROT 37.8* 25.8* 27.5* 51.1*  INR 3.98* 2.39* 2.61* 5.93*   Micro Results: Recent Results (from the past 240  hour(s))  Culture, blood (routine x 2)     Status: None   Collection Time: 01/18/15  3:34 AM  Result Value Ref Range Status   Specimen Description BLOOD RIGHT HAND  Final   Special Requests BOTTLES DRAWN AEROBIC ONLY 5CC  Final   Culture NO GROWTH 5 DAYS  Final   Report Status 01/23/2015 FINAL  Final  Culture, blood (routine x 2)     Status: None   Collection Time: 01/18/15  3:39 AM  Result Value Ref Range Status   Specimen Description BLOOD LEFT HAND  Final   Special Requests BOTTLES DRAWN AEROBIC AND ANAEROBIC 5CC   Final   Culture NO GROWTH 5 DAYS  Final   Report Status 01/23/2015 FINAL  Final   Studies/Results: Dg Chest Port 1 View  01/26/2015  CLINICAL DATA:  Pulmonary edema and shortness of breath EXAM: PORTABLE CHEST 1 VIEW COMPARISON:  Yesterday FINDINGS: Unchanged hazy appearance of the bilateral chest, left more than right, compatible with effusions. The left effusion is likely moderate volume. There is underlying symmetric interstitial and airspace opacity. Chronic cardiomegaly and stable vascular pedicle widening, accentuated by rightward rotation. No pneumothorax. IMPRESSION: Unchanged CHF pattern including left more than right pleural effusion. Electronically Signed   By: Monte Fantasia M.D.   On: 01/26/2015 11:29   Dg Chest [facility]  1 View  01/25/2015  CLINICAL DATA:  Acute onset of shortness of breath. EXAM: PORTABLE CHEST 1 VIEW COMPARISON:  Chest radiograph performed 01/23/2015 FINDINGS: Bilateral central airspace opacification is noted, with underlying vascular congestion and small to moderate bilateral pleural effusions, compatible with pulmonary edema. This appears worsened from the prior study. No pneumothorax is seen. The cardiomediastinal silhouette is enlarged. No acute osseous abnormalities are identified. IMPRESSION: Worsening pulmonary edema, with small to moderate bilateral pleural effusions. Underlying vascular congestion and cardiomegaly. Electronically Signed   By:  Garald Balding M.D.   On: 01/25/2015 06:01   Medications: I have reviewed the patient's current medications. Scheduled Meds: . antiseptic oral rinse  7 mL Mouth Rinse BID  . digoxin  0.25 mg Intravenous BID  . feeding supplement  1 Container Oral TID BM  . furosemide  80 mg Intravenous BID  . Gerhardt's butt cream   Topical Daily  . metoprolol tartrate  100 mg Oral BID  . oxyCODONE  20 mg Oral Q12H  . pantoprazole  40 mg Oral Q1200  . Warfarin - Pharmacist Dosing Inpatient   Does not apply q1800   Continuous Infusions: . sodium chloride 10 mL/hr at 01/23/15 0700  . diltiazem (CARDIZEM) infusion 5 mg/hr (01/26/15 1439)   PRN Meds:.ipratropium, levalbuterol, LORazepam, [DISCONTINUED] ondansetron **OR** ondansetron (ZOFRAN) IV, oxyCODONE Assessment/Plan:  Perforated Bowel s/p left colectomy and colostomy: wound vac in place, gen sx recommend d/c vac on discharge and wet to dry dressings at home.  - oxycontin 20mg  BID scheduled for pain- goal of relief of pain to 4/10. Oxycodone 5mg  q4h prn for breakthrough pain.  - Levaquin (1/1 - 1/4 discontinued), Meropenem 1 mg q8h (1/1-1/9). Leukocytosis is resolved. Pro calcitonin negative.   Atrial Fibrillation with RVR: Rates have been less well controlled today, with readings as high as the 160s. Patient is on warfarin now, but on pradaxa at home.  Cardiology is following - on lopressor 100 mg BID, HRs in the 160's this afternoon - Diltiazem ggt - digoxin 0.25 mg BID IV  - Warfarin per pharm,  transition to pradaxa on d/c  Hypoxia secondary to pulmonary edema: ABG this morning improved from earlier w/ normalization of pH. Repeat CXR shows no improvement. Still requiring 4LNC and intermittent BiPAP. RVR is likely contributor to pulmonary edema. - ABG this afternoon - lasix 80 mg IV BID - BiPAP prn - digoxin 0.25 mg BID IV  - Wean O2 as tolerated - Atrovent and Xopenex nebulizer q2h prn  Anxiety: Ativan 0.5 mg IV BID  DVT prophylaxis:  Warfarin  Dispo: Disposition is deferred at this time, awaiting improvement of current medical problems.  Anticipated discharge in approximately 2-3 day(s).   The patient does have a current PCP Thompson Grayer, MD) and does not need an [facility] hospital follow-up appointment after discharge. Will set pt up with IMTS clinic for follow up.   The patient does have transportation limitations that hinder transportation to clinic appointments.  .Services Needed at time of discharge: Y = Yes, Blank = No PT:   OT:   RN:   Equipment:   Other:     LOS: 12 days   Liberty Handy, MD 01/26/2015, 3:05 PM p

## 2015-01-26 NOTE — Progress Notes (Signed)
Pt requested me per RN. Pt stated that he felt SOB. Pt was mildly SOB on assessment able to talk and answer question and follow commands, pt wanted to you BiPAP. No wheezing noted, RT noted mild increase WOB. Pt is stable at this time on his BiPAP. Settings in flowsheet.

## 2015-01-26 NOTE — Clinical Social Work Placement (Signed)
   CLINICAL SOCIAL WORK PLACEMENT  NOTE  Date:  01/26/2015  Patient Details  Name: William Fischer MRN: [id number] Date of Birth: 1965/11/15  Clinical Social Work is seeking post-discharge placement for this patient at the [place] level of care (*CSW will initial, date and re-position this form in  chart as items are completed):  Yes   Patient/family provided with [place] Work Department's list of facilities offering this level of care within the geographic area requested by the patient (or if unable, by the patient's family).  Yes   Patient/family informed of their freedom to choose among providers that offer the needed level of care, that participate in Medicare, Medicaid or managed care program needed by the patient, have an available bed and are willing to accept the patient.  Yes   Patient/family informed of [facility]'s ownership interest in [facility] and [facility], as well as of the fact that they are under no obligation to receive care at these facilities.  PASRR submitted to EDS on 01/26/15     PASRR number received on       Existing PASRR number confirmed on       FL2 transmitted to all facilities in geographic area requested by pt/family on 01/26/15     FL2 transmitted to all facilities within larger geographic area on       Patient informed that his/her managed care company has contracts with or will negotiate with certain facilities, including the following:            Patient/family informed of bed offers received.  Patient chooses bed at       Physician recommends and patient chooses bed at      Patient to be transferred to   on  .  Patient to be transferred to facility by       Patient family notified on   of transfer.  Name of family member notified:        PHYSICIAN       Additional Comment:    _______________________________________________ Liz Beach MSW, [place], [place], [id number]

## 2015-01-26 NOTE — NC FL2 (Signed)
[place] LEVEL OF CARE SCREENING TOOL     IDENTIFICATION  Patient Name: William Fischer Birthdate: 1965/04/09 Sex: male Admission Date (Current Location): 01/14/2015  [place] and [place] Number:  Kathleen Argue [id number] [place] and Address:  [facility], [place] [address], [place], [place] [address]      Provider Number: [id number]  Attending Physician Name and Address:  Aldine Contes, MD  Relative Name and Phone Number:  Aquilla Shambley Art and [name]    Current Level of Care: Hospital Recommended Level of Care: [place] Prior Approval Number:    Date Approved/Denied:   PASRR Number:    Discharge Plan: SNF    Current Diagnoses: Patient Active Problem List   Diagnosis Date Noted  . Dyspnea   . Pleural effusion   . Perforated bowel ([place])   . Respiratory failure ([place])   . Atrial fibrillation with rapid ventricular response ([place])   . Systolic dysfunction with acute on chronic heart failure ([place])   . Hypoxia   . Diverticulitis of colon with perforation 01/14/2015  . AKI (acute kidney injury) ([place])   . Atrial fibrillation ([place]) 12/08/2011  . Chronic systolic heart failure ([place]) 12/08/2011  . Acute systolic heart failure ([place]) 12/03/2011  . Morbid obesity ([place]) 12/03/2011  . Depression with anxiety 06/29/2011  . Obesity 06/29/2011    Orientation RESPIRATION BLADDER Height & Weight    Self, Situation, Place  O2 (3L) Continent 5\' 10"  (177.8 cm) 298 lbs.  BEHAVIORAL SYMPTOMS/MOOD NEUROLOGICAL BOWEL NUTRITION STATUS   (NONE)  (NONE) Colostomy Diet (Heart Healthy)  AMBULATORY STATUS COMMUNICATION OF NEEDS Skin   Extensive Assist Verbally Surgical wounds, Wound Vac, Other (Comment) (Unstageable of left ear. 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 Wound vac on medial abdomen.)                       Personal Care Assistance Level of Assistance  Bathing, Dressing Bathing Assistance: Limited  assistance   Dressing Assistance: Limited assistance     Functional Limitations Info  Sight, Hearing, Speech Sight Info: Adequate Hearing Info: Adequate Speech Info: Adequate    SPECIAL CARE FACTORS FREQUENCY  PT (By licensed PT), OT (By licensed OT)     PT Frequency: 5/week OT Frequency: 5/week            Contractures Contractures Info: Not present    Additional Factors Info  Code Status, Allergies Code Status Info: Full Allergies Info: Contrast media           Current Medications (01/26/2015):  This is the current hospital active medication list Current Facility-Administered Medications  Medication Dose Route Frequency Provider Last Rate Last Dose  . 0.9 %  sodium chloride infusion   Intravenous Continuous Chesley Mires, MD 10 mL/hr at 01/23/15 0700    . antiseptic oral rinse (CPC / CETYLPYRIDINIUM CHLORIDE 0.05%) solution 7 mL  7 mL Mouth Rinse BID Javier Glazier, MD   7 mL at 01/26/15 0843  . digoxin (LANOXIN) 0.25 MG/ML injection 0.25 mg  0.25 mg Intravenous BID Peter M Martinique, MD   0.25 mg at 01/26/15 1026  . diltiazem (CARDIZEM) 1 mg/mL load via infusion 10 mg  10 mg Intravenous Once Peter M Martinique, MD       And  . diltiazem (CARDIZEM) 100 mg in dextrose 5 % 100 mL (1 mg/mL) infusion  5-15 mg/hr Intravenous Continuous Peter M Martinique, MD      . feeding supplement (  BOOST / RESOURCE BREEZE) liquid 1 Container  1 Container Oral TID BM Erby Pian, NP   1 Container at 01/26/15 0854  . furosemide (LASIX) injection 80 mg  80 mg Intravenous BID Peter M Martinique, MD   80 mg at 01/26/15 1026  . Gerhardt's butt cream   Topical Daily Norman Herrlich, MD      . ipratropium (ATROVENT) nebulizer solution 0.5 mg  0.5 mg Nebulization Q2H PRN Chesley Mires, MD      . levalbuterol (XOPENEX) nebulizer solution 0.63 mg  0.63 mg Nebulization Q2H PRN Chesley Mires, MD   0.63 mg at 01/25/15 0448  . LORazepam (ATIVAN) injection 0.5 mg  0.5 mg Intravenous Q6H PRN Liberty Handy, MD      .  metoprolol (LOPRESSOR) injection 5 mg  5 mg Intravenous Once Isaiah Serge, NP      . metoprolol (LOPRESSOR) tablet 100 mg  100 mg Oral BID Peter M Martinique, MD      . ondansetron [facility]) injection 4 mg  4 mg Intravenous Q6H PRN Milagros Loll, MD   4 mg at 01/15/15 1307  . oxyCODONE (Oxy IR/ROXICODONE) immediate release tablet 5 mg  5 mg Oral Q4H PRN Norman Herrlich, MD   5 mg at 01/26/15 0554  . oxyCODONE (OXYCONTIN) 12 hr tablet 20 mg  20 mg Oral Q12H Norman Herrlich, MD   20 mg at 01/26/15 0843  . pantoprazole (PROTONIX) EC tablet 40 mg  40 mg Oral Q1200 Chesley Mires, MD   40 mg at 01/25/15 1304  . Warfarin - Pharmacist Dosing Inpatient   Does not apply [place], [place] at 01/23/15 1800     Discharge Medications: Please see discharge summary for a list of discharge medications.  Relevant Imaging Results:  Relevant Lab Results:   Additional Information SSN: [id number]  Liz Beach MSW, [place], [place], [id number]

## 2015-01-26 NOTE — Significant Event (Signed)
CRITICAL VALUE ALERT  Critical value received: PT 51.1, INR 5.93  Date of notification: 01/26/2015 Time of notification: 0738  Critical value read back: Yes  Nurse who received alert: Yes  MD notified (1st page):  Dr. Liberty Handy  Time of first page: 0737am  MD notified (2nd page):  Time of second page:  Responding MD: Dr. Marijean Bravo Time MD responded: [phone]

## 2015-01-26 NOTE — Progress Notes (Signed)
   Corissa Oguinn, ANP-BC

## 2015-01-26 NOTE — Consult Note (Addendum)
WOC wound follow-up consult note Reason for Consult:  VAC dressing change to midline abdominal wound.  CCS PA at bedside to assess wound appearance with dressing change. Wound type: post-op full thickness surgical wound  Wound [id number] tissue, 85% red, 15% yellow interspersed throughout Drainage (amount, consistency, odor) mod amt yellow serosanguinous drainage, no odor Periwound: intact  Dressing procedure/placement/frequency: 1 pc of black foam used to fill the wound bed, sealed at 176mmHG cont suction. Pt tolerated well.  [place] team will change Q M/W/F.  WOC ostomy consult note Reason for Consult: Pouch change demonstration performed for patient and sister at the bedside. Stoma red and viable, 50% above skin level, 50% flush with skin level from 6:00 o'clock to 11:00 o'clock, 1 1/4 inches  Applied barrier ring and 2 piece pouching appliance to maintain seal. Mod amt brown stool in pouch Demonstrated pouch change using barrier ring and 2 piece pouching system. Pt watched and asked appropriate questions. He was able to open and close the velcro. Supplies at the bedside for staff nurse use  Julien Girt MSN, [place], [place], [place], [place]

## 2015-01-26 NOTE — Progress Notes (Signed)
ANTICOAGULATION CONSULT NOTE - Follow Up Consult  Pharmacy Consult for Coumadin Indication: atrial fibrillation  Allergies  Allergen Reactions  . Contrast Media [Iodinated Diagnostic Agents]     a diffuse macular rash after CTA chest     Patient Measurements: Height: 5\' 10"  (177.8 cm) Weight: 298 lb 11.2 oz (135.489 kg) IBW/kg (Calculated) : 73  Vital Signs: Temp: 98.2 F (36.8 C) (01/09 0500) BP: 137/97 mmHg (01/09 0500) Pulse Rate: 123 (01/09 0500)  Labs:  Recent Labs  01/24/15 0246 01/25/15 0500 01/26/15 0432  HGB 13.8 13.4 12.6*  HCT 43.3 43.8 39.8  PLT 389 433* 420*  LABPROT 25.8* 27.5* 51.1*  INR 2.39* 2.61* 5.93*  CREATININE 0.74 0.70 0.71    Estimated Creatinine Clearance: 158.2 mL/min (by C-G formula based on Cr of 0.71).  Assessment: Had been on Pradaxa prior to admission for afib. Changed to Coumadin on 01/21/15.    INR has been labile.  Supratherapeutic today (5.93) after being therapeutic the last 2 days.  No bleeding noted.  Little PO intake.   Goal of Therapy:  INR 2-3 Monitor platelets by anticoagulation protocol: Yes   Plan:   No Coumadin today.  Continue daily PT/INR.  Arty Baumgartner, [place] Pager: [phone] 01/26/2015,11:19 AM

## 2015-01-26 NOTE — Progress Notes (Signed)
UR Completed Numa Schroeter Graves-Bigelow, RN,BSN [phone]  

## 2015-01-26 NOTE — Progress Notes (Signed)
Patient ID: William Fischer, male   DOB: 03/21/1965, 50 y.o.   MRN: [id number]     [address] [place], [place] [address]    Phone: [phone] FAX: [phone]     Subjective: Appetite is poor, states he voids too much.  Discussed the importance of adequate PO intake post op to aid in wound healing.   Objective:  Vital signs:  Filed Vitals:   01/25/15 1900 01/25/15 2112 01/26/15 0500 01/26/15 0622  BP: 119/71 134/71 137/97   Pulse: 120 105 123   Temp: 98.5 F (36.9 C)  98.2 F (36.8 C)   TempSrc:      Resp: 21  20   Height:      Weight:    135.489 kg (298 lb 11.2 oz)  SpO2: 93%  95%     Last BM Date: 01/24/15  Intake/Output   Yesterday:  01/08 0701 - 01/09 0700 In: 760 [P.O.:560; IV Piggyback:200] Out: 2550 [Urine:2075; Drains:300; Stool:175] This shift:  Total I/O In: 360 [P.O.:360] Out: 200 [Urine:200]   Physical Exam: General: Pt awake/alert/oriented x4 in no acute distress Abdomen: Soft. Nondistended. Mildly tender at incisions only. Midline wound, VAC in place. ruq colostomy is functioning. No evidence of peritonitis. No incarcerated hernias.   Problem List:   Active Problems:   Diverticulitis of colon with perforation   Hypoxia   Atrial fibrillation with rapid ventricular response (HCC)   Systolic dysfunction with acute on chronic heart failure (HCC)   Perforated bowel (HCC)   Respiratory failure (HCC)   Dyspnea   Pleural effusion    Results:   Labs: Results for orders placed or performed during the hospital encounter of 01/14/15 (from the past 48 hour(s))  Procalcitonin - Baseline     Status: None   Collection Time: 01/24/15 11:10 AM  Result Value Ref Range   Procalcitonin 0.17 ng/mL    Comment:        Interpretation: PCT (Procalcitonin) <= 0.5 ng/mL: Systemic infection (sepsis) is not likely. Local bacterial infection is possible. (NOTE)         ICU PCT Algorithm                Non ICU PCT Algorithm    ----------------------------     ------------------------------         PCT < 0.25 ng/mL                 PCT < 0.1 ng/mL     Stopping of antibiotics            Stopping of antibiotics       strongly encouraged.               strongly encouraged.    ----------------------------     ------------------------------       PCT level decrease by               PCT < 0.25 ng/mL       >= 80% from peak PCT       OR PCT 0.25 - 0.5 ng/mL          Stopping of antibiotics                                             encouraged.  Stopping of antibiotics           encouraged.    ----------------------------     ------------------------------       PCT level decrease by              PCT >= 0.25 ng/mL       < 80% from peak PCT        AND PCT >= 0.5 ng/mL            Continuin g antibiotics                                              encouraged.       Continuing antibiotics            encouraged.    ----------------------------     ------------------------------     PCT level increase compared          PCT > 0.5 ng/mL         with peak PCT AND          PCT >= 0.5 ng/mL             Escalation of antibiotics                                          strongly encouraged.      Escalation of antibiotics        strongly encouraged.   Glucose, capillary     Status: Abnormal   Collection Time: 01/24/15 12:08 PM  Result Value Ref Range   Glucose-Capillary 186 (H) 65 - 99 mg/dL  CBC     Status: Abnormal   Collection Time: 01/25/15  5:00 AM  Result Value Ref Range   WBC 13.4 (H) 4.0 - 10.5 K/uL   RBC 4.31 4.22 - 5.81 MIL/uL   Hemoglobin 13.4 13.0 - 17.0 g/dL   HCT 43.8 39.0 - 52.0 %   MCV 101.6 (H) 78.0 - 100.0 fL   MCH 31.1 26.0 - 34.0 pg   MCHC 30.6 30.0 - 36.0 g/dL   RDW 16.4 (H) 11.5 - 15.5 %   Platelets 433 (H) 150 - 400 K/uL  Protime-INR     Status: Abnormal   Collection Time: 01/25/15  5:00 AM  Result Value Ref Range   Prothrombin Time 27.5 (H) 11.6 -  15.2 seconds   INR 2.61 (H) 0.00 - 8.32  Basic metabolic panel     Status: Abnormal   Collection Time: 01/25/15  5:00 AM  Result Value Ref Range   Sodium 138 135 - 145 mmol/L   Potassium 4.7 3.5 - 5.1 mmol/L   Chloride 91 (L) 101 - 111 mmol/L   CO2 43 (H) 22 - 32 mmol/L   Glucose, Bld 154 (H) 65 - 99 mg/dL   BUN 8 6 - 20 mg/dL   Creatinine, Ser 0.70 0.61 - 1.24 mg/dL   Calcium 8.3 (L) 8.9 - 10.3 mg/dL   GFR calc non Af Amer >60 >60 mL/min   GFR calc Af Amer >60 >60 mL/min    Comment: (NOTE) The eGFR has been calculated using the CKD EPI equation. This calculation has not been validated in all clinical situations. eGFR's persistently <60 mL/min signify possible Chronic Kidney Disease.  Anion gap 4 (L) 5 - 15  Blood gas, arterial     Status: Abnormal   Collection Time: 01/25/15  5:15 AM  Result Value Ref Range   O2 Content 4.0 L/min   Delivery systems NASAL CANNULA    pH, Arterial 7.265 (L) 7.350 - 7.450   pCO2 arterial 97.2 (HH) 35.0 - 45.0 mmHg    Comment: CRITICAL RESULT CALLED TO, READ BACK BY AND VERIFIED WITH: BUCKNER, B RN AT 0530 ON 01/25/2015 BY DAY,J RRT    pO2, Arterial 60.1 (L) 80.0 - 100.0 mmHg   Bicarbonate 42.7 (H) 20.0 - 24.0 mEq/L   TCO2 45.6 0 - 100 mmol/L   Acid-Base Excess 15.3 (H) 0.0 - 2.0 mmol/L   O2 Saturation 89.9 %   Patient temperature 98.6    Collection site LEFT RADIAL    Drawn by [id number]    Sample type ARTERIAL    Allens test (pass/fail) PASS PASS  Blood gas, arterial     Status: Abnormal   Collection Time: 01/25/15 10:55 AM  Result Value Ref Range   O2 Content 5.0 L/min   Delivery systems NASAL CANNULA    pH, Arterial 7.424 7.350 - 7.450   pCO2 arterial 68.5 (HH) 35.0 - 45.0 mmHg    Comment: CRITICAL RESULT CALLED TO, READ BACK BY AND VERIFIED WITH: BECKY FLOWERS,RRT, RCP @ 1058 BY MORGAN MACON,RRT,RCP    pO2, Arterial 60.9 (L) 80.0 - 100.0 mmHg   Bicarbonate 44.1 (H) 20.0 - 24.0 mEq/L   TCO2 46.2 0 - 100 mmol/L   Acid-Base Excess 18.4  (H) 0.0 - 2.0 mmol/L   O2 Saturation 93.1 %   Patient temperature 98.6    Collection site RIGHT RADIAL    Drawn by [id number]    Sample type ARTERIAL DRAW    Allens test (pass/fail) PASS PASS  Blood gas, arterial     Status: Abnormal   Collection Time: 01/25/15  9:20 PM  Result Value Ref Range   O2 Content 4.0 L/min   Delivery systems NASAL CANNULA    pH, Arterial 7.441 7.350 - 7.450   pCO2 arterial 68.1 (HH) 35.0 - 45.0 mmHg    Comment: CRITICAL RESULT CALLED TO, READ BACK BY AND VERIFIED WITH: DEBRA DUVALL AT 2135 BY CMCCOLLUM ON 01/25/2015    pO2, Arterial 71.8 (L) 80.0 - 100.0 mmHg   Bicarbonate 45.6 (H) 20.0 - 24.0 mEq/L   TCO2 47.7 0 - 100 mmol/L   Acid-Base Excess 19.9 (H) 0.0 - 2.0 mmol/L   O2 Saturation 95.4 %   Patient temperature 98.6    Collection site RIGHT RADIAL    Drawn by [id number]    Sample type ARTERIAL DRAW    Allens test (pass/fail) PASS PASS  CBC     Status: Abnormal   Collection Time: 01/26/15  4:32 AM  Result Value Ref Range   WBC 9.7 4.0 - 10.5 K/uL   RBC 4.01 (L) 4.22 - 5.81 MIL/uL   Hemoglobin 12.6 (L) 13.0 - 17.0 g/dL   HCT 39.8 39.0 - 52.0 %   MCV 99.3 78.0 - 100.0 fL   MCH 31.4 26.0 - 34.0 pg   MCHC 31.7 30.0 - 36.0 g/dL   RDW 16.1 (H) 11.5 - 15.5 %   Platelets 420 (H) 150 - 400 K/uL  Protime-INR     Status: Abnormal   Collection Time: 01/26/15  4:32 AM  Result Value Ref Range   Prothrombin Time 51.1 (H) 11.6 - 15.2 seconds    Comment:  REPEATED TO VERIFY   INR 5.93 (HH) 0.00 - 1.49    Comment: REPEATED TO VERIFY CRITICAL RESULT CALLED TO, READ BACK BY AND VERIFIED WITH: HIYU KSOR,RN AT 0737 01/26/15 BY K BARR   Procalcitonin     Status: None   Collection Time: 01/26/15  4:32 AM  Result Value Ref Range   Procalcitonin <0.10 ng/mL    Comment:        Interpretation: PCT (Procalcitonin) <= 0.5 ng/mL: Systemic infection (sepsis) is not likely. Local bacterial infection is possible. (NOTE)         ICU PCT Algorithm               Non ICU PCT  Algorithm    ----------------------------     ------------------------------         PCT < 0.25 ng/mL                 PCT < 0.1 ng/mL     Stopping of antibiotics            Stopping of antibiotics       strongly encouraged.               strongly encouraged.    ----------------------------     ------------------------------       PCT level decrease by               PCT < 0.25 ng/mL       >= 80% from peak PCT       OR PCT 0.25 - 0.5 ng/mL          Stopping of antibiotics                                             encouraged.     Stopping of antibiotics           encouraged.    ----------------------------     ------------------------------       PCT level decrease by              PCT >= 0.25 ng/mL       < 80% from peak PCT        AND PCT >= 0.5 ng/mL            Continuin g antibiotics                                              encouraged.       Continuing antibiotics            encouraged.    ----------------------------     ------------------------------     PCT level increase compared          PCT > 0.5 ng/mL         with peak PCT AND          PCT >= 0.5 ng/mL             Escalation of antibiotics                                          strongly encouraged.  Escalation of antibiotics        strongly encouraged.   Basic metabolic panel     Status: Abnormal   Collection Time: 01/26/15  4:32 AM  Result Value Ref Range   Sodium 139 135 - 145 mmol/L   Potassium 4.3 3.5 - 5.1 mmol/L   Chloride 88 (L) 101 - 111 mmol/L   CO2 44 (H) 22 - 32 mmol/L   Glucose, Bld 145 (H) 65 - 99 mg/dL   BUN 11 6 - 20 mg/dL   Creatinine, Ser 0.71 0.61 - 1.24 mg/dL   Calcium 8.2 (L) 8.9 - 10.3 mg/dL   GFR calc non Af Amer >60 >60 mL/min   GFR calc Af Amer >60 >60 mL/min    Comment: (NOTE) The eGFR has been calculated using the CKD EPI equation. This calculation has not been validated in all clinical situations. eGFR's persistently <60 mL/min signify possible Chronic Kidney Disease.     Anion gap 7 5 - 15    Imaging / Studies: Dg Chest Port 1 View  01/25/2015  CLINICAL DATA:  Acute onset of shortness of breath. EXAM: PORTABLE CHEST 1 VIEW COMPARISON:  Chest radiograph performed 01/23/2015 FINDINGS: Bilateral central airspace opacification is noted, with underlying vascular congestion and small to moderate bilateral pleural effusions, compatible with pulmonary edema. This appears worsened from the prior study. No pneumothorax is seen. The cardiomediastinal silhouette is enlarged. No acute osseous abnormalities are identified. IMPRESSION: Worsening pulmonary edema, with small to moderate bilateral pleural effusions. Underlying vascular congestion and cardiomegaly. Electronically Signed   By: Garald Balding M.D.   On: 01/25/2015 06:01    Medications / Allergies:  Scheduled Meds: . antiseptic oral rinse  7 mL Mouth Rinse BID  . furosemide  40 mg Oral Daily  . meropenem (MERREM) IV  1 g Intravenous 3 times per day  . metoprolol tartrate  75 mg Oral BID  . oxyCODONE  20 mg Oral Q12H  . pantoprazole  40 mg Oral Q1200  . Warfarin - Pharmacist Dosing Inpatient   Does not apply q1800   Continuous Infusions: . sodium chloride 10 mL/hr at 01/23/15 0700   PRN Meds:.ipratropium, levalbuterol, LORazepam, [DISCONTINUED] ondansetron **OR** ondansetron (ZOFRAN) IV, oxyCODONE  Antibiotics: Anti-infectives    Start     Dose/Rate Route Frequency Ordered Stop   01/18/15 0400  levofloxacin (LEVAQUIN) IVPB 750 mg  Status:  Discontinued     750 mg 100 mL/hr over 90 Minutes Intravenous Every 24 hours 01/18/15 0210 01/21/15 1108   01/18/15 0215  meropenem (MERREM) 1 g in sodium chloride 0.9 % 100 mL IVPB     1 g 200 mL/hr over 30 Minutes Intravenous 3 times per day 01/18/15 0210     01/16/15 0930  ertapenem (INVANZ) 1 g in sodium chloride 0.9 % 50 mL IVPB  Status:  Discontinued     1 g 100 mL/hr over 30 Minutes Intravenous Every 24 hours 01/16/15 0841 01/18/15 0203   01/14/15 1245   piperacillin-tazobactam (ZOSYN) IVPB 3.375 g  Status:  Discontinued     3.375 g 12.5 mL/hr over 240 Minutes Intravenous 3 times per day 01/14/15 1238 01/16/15 0841   01/14/15 1230  ciprofloxacin (CIPRO) IVPB 400 mg  Status:  Discontinued     400 mg 200 mL/hr over 60 Minutes Intravenous  Once 01/14/15 1218 01/14/15 1238   01/14/15 1230  metroNIDAZOLE (FLAGYL) IVPB 500 mg  Status:  Discontinued     500 mg 100 mL/hr over 60 Minutes Intravenous  Once 01/14/15 1218 01/14/15 1238        Assessment/Plan 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 Donne Hazel  -WOC for ostomy teaching -VAC changes MWF -mobilize, IS,encourage PO intake.  ID-meropenem 1/1---> stop DVT prophylaxis-coumadin  Dispo-stable for DC from a surgical standpoint.  If SNF unable to accommodate [facility] then ok to change to BID wet to dry dressing changes.   Erby Pian, [facility] Pager [phone]) For consults and floor pages call [phone](7A-4:30P)  01/26/2015  8:16 AM

## 2015-01-26 NOTE — Progress Notes (Signed)
Subjective: No chest pain, complains of increased SOB today.  Objective: Vital signs in last 24 hours: Temp:  [98.2 F (36.8 C)-98.8 F (37.1 C)] 98.2 F (36.8 C) (01/09 0500) Pulse Rate:  [105-126] 123 (01/09 0500) Resp:  [20-22] 20 (01/09 0500) BP: (119-137)/(66-97) 137/97 mmHg (01/09 0500) SpO2:  [93 %-95 %] 95 % (01/09 0500) Weight:  [298 lb 11.2 oz (135.489 kg)] 298 lb 11.2 oz (135.489 kg) (01/09 0622) Weight change:  Last BM Date: 01/24/15 Intake/Output from previous day: 01/08 0701 - 01/09 0700 In: 760 [P.O.:560; IV Piggyback:200] Out: 2550 [Urine:2075; Drains:300; Stool:175] Intake/Output this shift: Total I/O In: 360 [P.O.:360] Out: 200 [Urine:200]  PE: General:Pleasant affect, NAD Skin:Warm and dry, brisk capillary refill HEENT:normocephalic, sclera clear, mucus membranes moist Heart:irreg irreg  without murmur, gallup, rub or click Lungs:clear without rales, rhonchi, or wheezes [id number], + colostomy draining well,  + BS, do not palpate liver spleen or masses Ext:no lower ext edema, 2+ pedal pulses, 2+ radial pulses Neuro:alert and oriented, MAE, follows commands, + facial symmetry Tele: a flutter HR 90s to 110.    Lab Results:  Recent Labs  01/25/15 0500 01/26/15 0432  WBC 13.4* 9.7  HGB 13.4 12.6*  HCT 43.8 39.8  PLT 433* 420*   BMET  Recent Labs  01/25/15 0500 01/26/15 0432  NA 138 139  K 4.7 4.3  CL 91* 88*  CO2 43* 44*  GLUCOSE 154* 145*  BUN 8 11  CREATININE 0.70 0.71  CALCIUM 8.3* 8.2*   No results for input(s): TROPONINI in the last 72 hours.  Invalid input(s): CK, MB  Lab Results  Component Value Date   CHOL 149 07/16/2013   HDL 46.20 07/16/2013   LDLCALC 87 07/16/2013   TRIG 156* 01/17/2015   CHOLHDL 3 07/16/2013   Lab Results  Component Value Date   HGBA1C 6.1* 01/15/2015     Lab Results  Component Value Date   TSH 2.14 07/16/2013         Studies/Results: Dg Chest Port 1 View  01/25/2015   CLINICAL DATA:  Acute onset of shortness of breath. EXAM: PORTABLE CHEST 1 VIEW COMPARISON:  Chest radiograph performed 01/23/2015 FINDINGS: Bilateral central airspace opacification is noted, with underlying vascular congestion and small to moderate bilateral pleural effusions, compatible with pulmonary edema. This appears worsened from the prior study. No pneumothorax is seen. The cardiomediastinal silhouette is enlarged. No acute osseous abnormalities are identified. IMPRESSION: Worsening pulmonary edema, with small to moderate bilateral pleural effusions. Underlying vascular congestion and cardiomegaly. Electronically Signed   By: Garald Balding M.D.   On: 01/25/2015 06:01    Medications: I have reviewed the patient's current medications. Scheduled Meds: . antiseptic oral rinse  7 mL Mouth Rinse BID  . furosemide  40 mg Oral Daily  . meropenem (MERREM) IV  1 g Intravenous 3 times per day  . metoprolol tartrate  75 mg Oral BID  . oxyCODONE  20 mg Oral Q12H  . pantoprazole  40 mg Oral Q1200  . Warfarin - Pharmacist Dosing Inpatient   Does not apply q1800   Continuous Infusions: . sodium chloride 10 mL/hr at 01/23/15 0700   PRN Meds:.ipratropium, levalbuterol, LORazepam, [DISCONTINUED] ondansetron **OR** ondansetron (ZOFRAN) IV, oxyCODONE  Assessment/Plan: 49 y.o. male with a past medical history significant for longstanding persistent atrial fibrillation, previous tachycardia induced cardiomyopathy, depression, and obesity. He has been followed by Dr Rayann Heman for EP care and Truitt Merle for cardiology. He  failed TEE/DCCV in 11/2011. He was then placed on Sotalol and repeat DCCV was also unsuccessful. With obesity and severe LA enlargement, he was not felt to be a candidate for PVI and rate control was recommended. He has done relatively well from an AF standpoint but has declined OSA evaluation in the past and also has not taken ACE-I as recommended- admitted with abd pain underwent  EXPLORATORY LAPAROTOMY WITH LEFT COLECTOMY AND COLOSTOMY 01/17/15. Was on Vent. Now extubated. We were asked to see for tachycardia and management. EP signed off.  Back on oral meds    Active Problems:   Diverticulitis of colon with perforation   Hypoxia   Atrial fibrillation with rapid ventricular response (HCC)   Systolic dysfunction with acute on chronic heart failure (HCC)   Perforated bowel (HCC)   Respiratory failure (HCC)   Dyspnea   Pleural effusion  A flutter with increased HR to 130s at times.  BP stable ? Increase BB to 100 BID- Dr. Martinique to see.  LOS: 12 days   Time spent with pt. :15 minutes. [facility] R  Nurse Practitioner Certified Pager [phone] or after 5pm and on weekends call [phone] 01/26/2015, 8:07 AM Patient seen and examined and history reviewed. Agree with above findings and plan. Patient with increased respiratory distress this am. No cough or chest pain. HR increased into 140s. On exam HR fast and irregular. Diminished BS with bilateral rales. CXR yesterday shows increased pulmonary edema and effusions. Will increase metoprolol to 100 mg bid and add digoxin for rate control. Start IV lasix 80 mg bid. Follow  Strict I/O and daily weight. Check BMET daily. Check BNP. If HR remains high may need short term IV diltiazem until pulmonary edema improved. EF 45-50% preop but difficult study.  Peter Martinique, [place] 01/26/2015 9:29 AM

## 2015-01-26 NOTE — Clinical Social Work Note (Signed)
Clinical Social Work Assessment  Patient Details  Name: SHARVIL HOEY MRN: [id number] Date of Birth: 1965/04/07  Date of referral:  01/26/15               Reason for consult:  Discharge Planning, Facility Placement                Permission sought to share information with:  Facility Sport and exercise psychologist, Family Supports Permission granted to share information::  Yes, Verbal Permission Granted  Name::     [place] and Pensions consultant::  SNFs  Relationship::  Sister and mom  Contact Information:     Housing/Transportation Living arrangements for the past 2 months:  Single Family Home Source of Information:  Other (Comment Required) (Sister at bedside) Patient Interpreter Needed:  None Criminal Activity/Legal Involvement Pertinent to Current Situation/Hospitalization:  No - Comment as needed Significant Relationships:  Adult Children, Parents, Siblings Lives with:  Self Do you feel safe going back to the place where you live?  No Need for family participation in patient care:  Yes (Comment)  Care giving concerns:  Family concerned about recommendation for patient to return home. Patient will not have the assistance he needs at home and requires frequent nursing care.   Social Worker assessment / plan:  CSW and RNCM met with patient and sister at bedside. Patient is asleep and currently on bi-pap. Sister Jessa Stinson Art states that the patient will have very limited support at home and that the patient will likely need to go to a SNF at discharge that can continue to care for his wounds. CSW explained limited options with medicaid but explained that SNF referral will be made and CSW will followup with family regarding available options. Patient's mother is hopefull for bed at [facility] in [place]. CSW provided SNF list to sister. CSW to followup with available bed offers.  Employment status:    Forensic scientist:  Medicaid [place] PT Recommendations:  Home with [place] /  Referral to community resources:  [place]  Patient/Family's Response to care:  Patient's family appears happy with the care the patient has received in the hospital.  Patient/Family's Understanding of and Emotional Response to Diagnosis, Current Treatment, and Prognosis:  Patient and family appear to be coping well with hospitalization. Patient and family have good understanding of reason for admission and post DC needs.   Emotional Assessment Appearance:  Appears stated age Attitude/Demeanor/Rapport:  Unable to Assess (Patient sleeping on bi-pap) Affect (typically observed):  Unable to Assess (Patient sleeping on bi-pap) Orientation:  Oriented to Self, Oriented to Place, Oriented to Situation Alcohol / Substance use:  Never Used Psych involvement (Current and /or in the community):  No (Comment)  Discharge Needs  Concerns to be addressed:  Discharge Planning Concerns Readmission within the last 30 days:  No Current discharge risk:  Physical Impairment Barriers to Discharge:  Continued Medical Work up   [employer] MSW, [place], [place], [phone]

## 2015-01-27 ENCOUNTER — Inpatient Hospital Stay (HOSPITAL_COMMUNITY): Payer: Medicaid Other

## 2015-01-27 DIAGNOSIS — F419 Anxiety disorder, unspecified: Secondary | ICD-10-CM

## 2015-01-27 LAB — CBC
HEMATOCRIT: 42.1 % (ref 39.0–52.0)
Hemoglobin: 13.1 g/dL (ref 13.0–17.0)
MCH: 30.8 pg (ref 26.0–34.0)
MCHC: 31.1 g/dL (ref 30.0–36.0)
MCV: 99.1 fL (ref 78.0–100.0)
Platelets: 453 10*3/uL — ABNORMAL HIGH (ref 150–400)
RBC: 4.25 MIL/uL (ref 4.22–5.81)
RDW: 16.1 % — AB (ref 11.5–15.5)
WBC: 11.5 10*3/uL — ABNORMAL HIGH (ref 4.0–10.5)

## 2015-01-27 LAB — BLOOD GAS, ARTERIAL
ACID-BASE EXCESS: 20 mmol/L — AB (ref 0.0–2.0)
Acid-Base Excess: 23.5 mmol/L — ABNORMAL HIGH (ref 0.0–2.0)
BICARBONATE: 48.9 meq/L — AB (ref 20.0–24.0)
BICARBONATE: 45.7 meq/L — AB (ref 20.0–24.0)
DRAWN BY: 244861
DRAWN BY: 441371
O2 CONTENT: 3 L/min
O2 Content: 3 L/min
O2 SAT: 90.3 %
O2 SAT: 92 %
PATIENT TEMPERATURE: 98.6
PH ART: 7.521 — AB (ref 7.350–7.450)
PH ART: 7.446 (ref 7.350–7.450)
Patient temperature: 98.6
TCO2: 50.8 mmol/L (ref 0–100)
TCO2: 47.8 mmol/L (ref 0–100)
pCO2 arterial: 60.2 mmHg (ref 35.0–45.0)
pCO2 arterial: 67.5 mmHg (ref 35.0–45.0)
pO2, Arterial: 55.7 mmHg — ABNORMAL LOW (ref 80.0–100.0)
pO2, Arterial: 63 mmHg — ABNORMAL LOW (ref 80.0–100.0)

## 2015-01-27 LAB — BASIC METABOLIC PANEL
ANION GAP: 8 (ref 5–15)
BUN: 14 mg/dL (ref 6–20)
CO2: 47 mmol/L — AB (ref 22–32)
Calcium: 8.2 mg/dL — ABNORMAL LOW (ref 8.9–10.3)
Chloride: 85 mmol/L — ABNORMAL LOW (ref 101–111)
Creatinine, Ser: 0.85 mg/dL (ref 0.61–1.24)
GFR calc Af Amer: >60 mL/min (ref >60)
GLUCOSE: 187 mg/dL — AB (ref 65–99)
POTASSIUM: 3.9 mmol/L (ref 3.5–5.1)
Sodium: 140 mmol/L (ref 135–145)

## 2015-01-27 LAB — PROTIME-INR
INR: 3.66 — AB (ref 0.00–1.49)
Prothrombin Time: 35.5 seconds — ABNORMAL HIGH (ref 11.6–15.2)

## 2015-01-27 MED ORDER — FUROSEMIDE 10 MG/ML IJ SOLN
40.0000 mg | Freq: Two times a day (BID) | INTRAMUSCULAR | Status: DC
  Administered 2015-01-27 – 2015-01-28 (×3): 40 mg via INTRAVENOUS
  Filled 2015-01-27 (×3): qty 4

## 2015-01-27 MED ORDER — METOPROLOL TARTRATE 50 MG PO TABS
150.0000 mg | ORAL_TABLET | Freq: Two times a day (BID) | ORAL | Status: DC
  Administered 2015-01-27 – 2015-02-02 (×12): 150 mg via ORAL
  Filled 2015-01-27 (×12): qty 1

## 2015-01-27 MED ORDER — FUROSEMIDE 10 MG/ML IJ SOLN
40.0000 mg | Freq: Once | INTRAMUSCULAR | Status: AC
  Administered 2015-01-27: 40 mg via INTRAVENOUS
  Filled 2015-01-27: qty 4

## 2015-01-27 MED ORDER — DILTIAZEM HCL 60 MG PO TABS
60.0000 mg | ORAL_TABLET | Freq: Three times a day (TID) | ORAL | Status: DC
Start: 2015-01-27 — End: 2015-01-28
  Administered 2015-01-27 – 2015-01-28 (×3): 60 mg via ORAL
  Filled 2015-01-27 (×3): qty 1

## 2015-01-27 MED ORDER — METOPROLOL TARTRATE 1 MG/ML IV SOLN
5.0000 mg | Freq: Once | INTRAVENOUS | Status: AC
  Administered 2015-01-27: 5 mg via INTRAVENOUS
  Filled 2015-01-27: qty 5

## 2015-01-27 MED ORDER — ACETAZOLAMIDE SODIUM 500 MG IJ SOLR
500.0000 mg | Freq: Once | INTRAMUSCULAR | Status: AC
  Administered 2015-01-27: 500 mg via INTRAVENOUS
  Filled 2015-01-27: qty 500

## 2015-01-27 MED ORDER — DIGOXIN 125 MCG PO TABS
0.1250 mg | ORAL_TABLET | Freq: Every day | ORAL | Status: DC
  Administered 2015-01-27 – 2015-02-02 (×7): 0.125 mg via ORAL
  Filled 2015-01-27 (×7): qty 1

## 2015-01-27 NOTE — Progress Notes (Signed)
Subjective:  Patient says shortness of breath has improved and is feeling better today. He said that he was offered the BiPAP last night and used it, but he didn't feel like he needed it. He denied any abdominal pain.    Objective: Vital signs in last 24 hours: Filed Vitals:   01/27/15 0030 01/27/15 0500 01/27/15 0600 01/27/15 0801  BP:  126/68  134/64  Pulse:  110    Temp:  99.2 F (37.3 C)    TempSrc:  Oral    Resp:  28  23  Height:      Weight:   280 lb (127.007 kg)   SpO2: 92% 93%  93%   Weight change: -18 lb 11.2 oz (-8.482 kg)  Intake/Output Summary (Last 24 hours) at 01/27/15 1052 Last data filed at 01/27/15 0847  Gross per 24 hour  Intake   1790 ml  Output   5575 ml  Net  -3785 ml   Physical Exam General: NAD, laying in bed Cardiovascular: Irregularly, irregular rhythm, no m/r/g. Mildly Tachycardic. Pulmonary: clear to anterior auscultation. No crackles today. Abdominal: Colostomy bag in place. Midline surgical site w/ wound vac. Wound site clean and dry. No abdominal or retroperitoneal tenderness Extremities: No clubbing, cyanosis, or edema. Psychiatric: Normal affect. Behavior normal.  Lab Results: Basic Metabolic Panel:  Recent Labs Lab 01/26/15 0432 01/27/15 0341  NA 139 140  K 4.3 3.9  CL 88* 85*  CO2 44* 47*  GLUCOSE 145* 187*  BUN 11 14  CREATININE 0.71 0.85  CALCIUM 8.2* 8.2*   CBC:  Recent Labs Lab 01/26/15 0432 01/27/15 0341  WBC 9.7 11.5*  HGB 12.6* 13.1  HCT 39.8 42.1  MCV 99.3 99.1  PLT 420* 453*    CBG:  Recent Labs Lab 01/21/15 1128 01/21/15 1917 01/21/15 2332 01/22/15 0333 01/22/15 0732 01/24/15 1208  GLUCAP 125* 207* 135* 101* 102* 186*   Coagulation:  Recent Labs Lab 01/24/15 0246 01/25/15 0500 01/26/15 0432 01/27/15 0341  LABPROT 25.8* 27.5* 51.1* 35.5*  INR 2.39* 2.61* 5.93* 3.66*   Micro Results: Recent Results (from the past 240 hour(s))  Culture, blood (routine x 2)     Status: None   Collection Time: 01/18/15  3:34 AM  Result Value Ref Range Status   Specimen Description BLOOD RIGHT HAND  Final   Special Requests BOTTLES DRAWN AEROBIC ONLY 5CC  Final   Culture NO GROWTH 5 DAYS  Final   Report Status 01/23/2015 FINAL  Final  Culture, blood (routine x 2)     Status: None   Collection Time: 01/18/15  3:39 AM  Result Value Ref Range Status   Specimen Description BLOOD LEFT HAND  Final   Special Requests BOTTLES DRAWN AEROBIC AND ANAEROBIC 5CC   Final   Culture NO GROWTH 5 DAYS  Final   Report Status 01/23/2015 FINAL  Final   Studies/Results: Dg Chest Port 1 View  01/26/2015  CLINICAL DATA:  Pulmonary edema and shortness of breath EXAM: PORTABLE CHEST 1 VIEW COMPARISON:  Yesterday FINDINGS: Unchanged hazy appearance of the bilateral chest, left more than right, compatible with effusions. The left effusion is likely moderate volume. There is underlying symmetric interstitial and airspace opacity. Chronic cardiomegaly and stable vascular pedicle widening, accentuated by rightward rotation. No pneumothorax. IMPRESSION: Unchanged CHF pattern including left more than right pleural effusion. Electronically Signed   By: Monte Fantasia M.D.   On: 01/26/2015 11:29   Medications: I have reviewed the patient's current medications. Scheduled  Meds: . acetaZOLAMIDE  500 mg Intravenous Once  . antiseptic oral rinse  7 mL Mouth Rinse BID  . digoxin  0.125 mg Oral Daily  . diltiazem  60 mg Oral 3 times per day  . feeding supplement  1 Container Oral TID BM  . furosemide  40 mg Intravenous BID  . Gerhardt's butt cream   Topical Daily  . metoprolol tartrate  150 mg Oral BID  . oxyCODONE  20 mg Oral Q12H  . pantoprazole  40 mg Oral Q1200   Continuous Infusions: . sodium chloride 10 mL/hr at 01/23/15 0700   PRN Meds:.ipratropium, levalbuterol, LORazepam, [DISCONTINUED] ondansetron **OR** ondansetron (ZOFRAN) IV, oxyCODONE Assessment/Plan:  Perforated Bowel s/p left colectomy and  colostomy: wound vac in place, gen sx recommend d/c vac on discharge or transition to wet to dry dressings if SNF does not manage wound vac.  - oxycontin 20mg  BID scheduled for pain- goal of relief of pain to 4/10. Oxycodone 5mg  q4h prn for breakthrough pain.  - Levaquin (1/1 - 1/4 discontinued), Meropenem 1 mg q8h (1/1-1/9). Leukocytosis is resolved. Pro calcitonin negative.   Atrial Fibrillation with RVR: HR in 110s today, better controlled overall. INR: 3.6. Patient is on warfarin now, but on pradaxa at home.  Cardiology is following - Lopressor 150 mg BID, HRs in the 110s this today. - Diltiazem gtt, start oral diltiazem 60 mg TID and attempt to taper off gtt - digoxin 0.125 mg po daily - Warfarin per pharm,  transition to pradaxa on d/c  Hypoxia secondary to pulmonary edema: ABG this morning improved from earlier w/ normalization of pH. Repeat CXR shows no improvement. Still requiring 4LNC and intermittent BiPAP. RVR is likely contributor to pulmonary edema. - ABG this afternoon - lasix 40 mg IV BID - BiPAP prn - Wean O2 as tolerated - Atrovent and Xopenex nebulizer [id number] prn  Metabolic Alkalosis: Bicarb 47, pH 7.5 in setting of diuresis. Likely contraction alkalosis.  - Acetazolamide 500 mg IV once - Repeat ABG at 1:30pm  Anxiety: Ativan 0.5 mg IV BID  DVT prophylaxis: Warfarin  Dispo: Disposition is deferred at this time, awaiting improvement of current medical problems.  Anticipated discharge in approximately 2-3 day(s).   The patient does have a current PCP Thompson Grayer, MD) and does not need an [facility] hospital follow-up appointment after discharge. Will set pt up with IMTS clinic for follow up.   The patient does have transportation limitations that hinder transportation to clinic appointments.  .Services Needed at time of discharge: Y = Yes, Blank = No PT:   OT:   RN:   Equipment:   Other:     LOS: 13 days   Liberty Handy, MD 01/27/2015, 10:52 AM p

## 2015-01-27 NOTE — Progress Notes (Signed)
Physical medicine rehabilitation consult requested chart reviewed. As noted from prior physical therapy notes patient has progressed nicely ambulating 330 feet minimal assist using rolling walker 01/23/2015. Therapies have been a bit infrequent. Recommendations have been made for home health therapies. Patient will not qualify for inpatient rehabilitation services at this time. Hold on formal rehabilitation consult at this time with recommendations of discharge to home

## 2015-01-27 NOTE — Progress Notes (Signed)
ABG called to this RN. Results called to MD. No new orders received. Will continue to monitor.   Ruben Reason, RN

## 2015-01-27 NOTE — Progress Notes (Signed)
abg collected  

## 2015-01-27 NOTE — Progress Notes (Signed)
1900-2000Patient noted to be in distress and unwilling to wear BiPAP. On call intern paged to make aware and educate patient on why it is important. HR in 150s and patient agitated. On call cardiologist came and explained to patient the importance of BiPAP. Patient was agreeable. Respiratory therapist placed patient on BiPAP.   2000-2100 On call intern came to see patient. HR still in 130s-170s. Patient was resting. Orders given for IV lasix, cxr, and to give scheduled metoprolol early were received and carried out.   2100-2200 Patient wore BiPAP for about an hour before refusing it again. Patient placed on 3 L [place] with sats 88-92% (where the patient usually lives), HR 90s-110 after metoprolol. Will continue to monitor.

## 2015-01-27 NOTE — Progress Notes (Signed)
10 Days Post-Op  Subjective: PT doing well with PO  Ostomy funcitoning well  Objective: Vital signs in last 24 hours: Temp:  [98.3 F (36.8 C)-99.2 F (37.3 C)] 99.2 F (37.3 C) (01/10 0500) Pulse Rate:  [110-120] 110 (01/10 0500) Resp:  [20-34] 23 (01/10 0801) BP: (117-139)/(64-77) 134/64 mmHg (01/10 0801) SpO2:  [90 %-99 %] 93 % (01/10 0801) FiO2 (%):  [50 %] 50 % (01/09 0925) Weight:  [127.007 kg (280 lb)] 127.007 kg (280 lb) (01/10 0600) Last BM Date: 01/27/15  Intake/Output from previous day: 01/09 0701 - 01/10 0700 In: 1670 [P.O.:1640; I.V.:30] Out: 5825 [Urine:5350; Stool:475] Intake/Output this shift: Total I/O In: -  Out: 25 [Stool:25]  General appearance: alert and cooperative GI: soft, non-tender; bowel sounds normal; no masses,  no organomegaly and midline wound with vac in place, ostomy patent  Lab Results:   Recent Labs  01/26/15 0432 01/27/15 0341  WBC 9.7 11.5*  HGB 12.6* 13.1  HCT 39.8 42.1  PLT 420* 453*   BMET  Recent Labs  01/26/15 0432 01/27/15 0341  NA 139 140  K 4.3 3.9  CL 88* 85*  CO2 44* 47*  GLUCOSE 145* 187*  BUN 11 14  CREATININE 0.71 0.85  CALCIUM 8.2* 8.2*   PT/INR  Recent Labs  01/26/15 0432 01/27/15 0341  LABPROT 51.1* 35.5*  INR 5.93* 3.66*   ABG  Recent Labs  01/26/15 1556 01/27/15 0452  PHART 7.479* 7.521*  HCO3 48.2* 48.9*    Studies/Results: Dg Chest Port 1 View  01/26/2015  CLINICAL DATA:  Pulmonary edema and shortness of breath EXAM: PORTABLE CHEST 1 VIEW COMPARISON:  Yesterday FINDINGS: Unchanged hazy appearance of the bilateral chest, left more than right, compatible with effusions. The left effusion is likely moderate volume. There is underlying symmetric interstitial and airspace opacity. Chronic cardiomegaly and stable vascular pedicle widening, accentuated by rightward rotation. No pneumothorax. IMPRESSION: Unchanged CHF pattern including left more than right pleural effusion. Electronically  Signed   By: Monte Fantasia M.D.   On: 01/26/2015 11:29    Anti-infectives: Anti-infectives    Start     Dose/Rate Route Frequency Ordered Stop   01/18/15 0400  levofloxacin (LEVAQUIN) IVPB 750 mg  Status:  Discontinued     750 mg 100 mL/hr over 90 Minutes Intravenous Every 24 hours 01/18/15 0210 01/21/15 1108   01/18/15 0215  meropenem (MERREM) 1 g in sodium chloride 0.9 % 100 mL IVPB  Status:  Discontinued     1 g 200 mL/hr over 30 Minutes Intravenous 3 times per day 01/18/15 0210 01/26/15 0822   01/16/15 0930  ertapenem (INVANZ) 1 g in sodium chloride 0.9 % 50 mL IVPB  Status:  Discontinued     1 g 100 mL/hr over 30 Minutes Intravenous Every 24 hours 01/16/15 0841 01/18/15 0203   01/14/15 1245  piperacillin-tazobactam (ZOSYN) IVPB 3.375 g  Status:  Discontinued     3.375 g 12.5 mL/hr over 240 Minutes Intravenous 3 times per day 01/14/15 1238 01/16/15 0841   01/14/15 1230  ciprofloxacin (CIPRO) IVPB 400 mg  Status:  Discontinued     400 mg 200 mL/hr over 60 Minutes Intravenous  Once 01/14/15 1218 01/14/15 1238   01/14/15 1230  metroNIDAZOLE (FLAGYL) IVPB 500 mg  Status:  Discontinued     500 mg 100 mL/hr over 60 Minutes Intravenous  Once 01/14/15 1218 01/14/15 1238      Assessment/Plan: s/p Procedure(s): EXPLORATORY LAPAROTOMY WITH LEFT COLECTOMY AND COLOSTOMY (N/A) Pt tol  PO well.  Ostomy funcitoning well Con't vac for now.  If Vac not able to be transferred to final dispo OK to transition to WTD dressing changes    LOS: 13 days    Rosario Jacks., Anne Hahn 01/27/2015

## 2015-01-27 NOTE — Clinical Social Work Note (Signed)
CSW met with patient at bedside and explained that finding SNF placement in this area is proving to be difficult. At this time the patient has no SNF bed offers in [place] or the surrounding counties. CSW explained that the patient may need to go to a facility further away and asked if the patient would be agreeable to this. The patient seems unsure and wants to discuss this further with his family. CSW has made calls to facilities that traditionally accept Medicaid only and LOG patients but at this time a bed offer in this area does not exist. Patient's sister and daughter are at [place] for long term medicaid today. CSW will continue to look for placement options.      Liz Beach MSW, [place], [place], [phone]

## 2015-01-27 NOTE — Progress Notes (Signed)
Physical Therapy Treatment Note Please see also PT Re-Evaluation of this date.  Clinical Impression: SATURATION QUALIFICATIONS: (This note is used to comply with regulatory documentation for home oxygen)  Patient Saturations on Room Air at Rest = 89%  Patient Saturations on Room Air while Ambulating = 85%  Patient Saturations on 3 Liters of oxygen while Ambulating = 92%  Please briefly explain why patient needs home oxygen: Patient requires supplemental oxygen to maintain oxygen saturations at acceptable, safe levels with physical activity.   Thanks,  Roney Marion, [place]  Acute Rehabilitation Services Pager [phone] Office [phone]

## 2015-01-27 NOTE — Progress Notes (Signed)
Subjective: Feeling much better today. Decreased SOB. Denies palpitations or chest pain.  Objective: Vital signs in last 24 hours: Temp:  [98.3 F (36.8 C)-99.2 F (37.3 C)] 99.2 F (37.3 C) (01/10 0500) Pulse Rate:  [110-120] 110 (01/10 0500) Resp:  [20-34] 23 (01/10 0801) BP: (117-139)/(64-77) 134/64 mmHg (01/10 0801) SpO2:  [90 %-99 %] 93 % (01/10 0801) FiO2 (%):  [50 %] 50 % (01/09 0925) Weight:  [127.007 kg (280 lb)] 127.007 kg (280 lb) (01/10 0600) Weight change: -8.482 kg (-18 lb 11.2 oz) Last BM Date: 01/27/15 Intake/Output from previous day: 01/09 0701 - 01/10 0700 In: 1670 [P.O.:1640; I.V.:30] Out: 5825 [Urine:5350; Stool:475] Intake/Output this shift: Total I/O In: 480 [P.O.:480] Out: 525 [Urine:500; Stool:25]  PE: General:Pleasant affect, NAD Skin:Warm and dry, brisk capillary refill HEENT:normocephalic, sclera clear, mucus membranes moist Heart:irreg irreg  without murmur, gallup, rub or click Lungs:clear without rales, rhonchi, or wheezes. Decreased BS basses. [id number], + colostomy draining well,  + BS, do not palpate liver spleen or masses Ext:no lower ext edema, 2+ pedal pulses, 2+ radial pulses Neuro:alert and oriented, MAE, follows commands, + facial symmetry  Tele: atrial fibrillation  HR 90s to 100.    Lab Results:  Recent Labs  01/26/15 0432 01/27/15 0341  WBC 9.7 11.5*  HGB 12.6* 13.1  HCT 39.8 42.1  PLT 420* 453*   BMET  Recent Labs  01/26/15 0432 01/27/15 0341  NA 139 140  K 4.3 3.9  CL 88* 85*  CO2 44* 47*  GLUCOSE 145* 187*  BUN 11 14  CREATININE 0.71 0.85  CALCIUM 8.2* 8.2*   No results for input(s): TROPONINI in the last 72 hours.  Invalid input(s): CK, MB  Lab Results  Component Value Date   CHOL 149 07/16/2013   HDL 46.20 07/16/2013   LDLCALC 87 07/16/2013   TRIG 156* 01/17/2015   CHOLHDL 3 07/16/2013   Lab Results  Component Value Date   HGBA1C 6.1* 01/15/2015     Lab Results  Component  Value Date   TSH 2.14 07/16/2013         Studies/Results: Dg Chest Port 1 View  01/26/2015  CLINICAL DATA:  Pulmonary edema and shortness of breath EXAM: PORTABLE CHEST 1 VIEW COMPARISON:  Yesterday FINDINGS: Unchanged hazy appearance of the bilateral chest, left more than right, compatible with effusions. The left effusion is likely moderate volume. There is underlying symmetric interstitial and airspace opacity. Chronic cardiomegaly and stable vascular pedicle widening, accentuated by rightward rotation. No pneumothorax. IMPRESSION: Unchanged CHF pattern including left more than right pleural effusion. Electronically Signed   By: Monte Fantasia M.D.   On: 01/26/2015 11:29    Medications: I have reviewed the patient's current medications. Scheduled Meds: . antiseptic oral rinse  7 mL Mouth Rinse BID  . digoxin  0.125 mg Oral Daily  . diltiazem  60 mg Oral 3 times per day  . feeding supplement  1 Container Oral TID BM  . furosemide  40 mg Intravenous BID  . Gerhardt's butt cream   Topical Daily  . metoprolol tartrate  150 mg Oral BID  . oxyCODONE  20 mg Oral Q12H  . pantoprazole  40 mg Oral Q1200  . Warfarin - Pharmacist Dosing Inpatient   Does not apply q1800   Continuous Infusions: . sodium chloride 10 mL/hr at 01/23/15 0700   PRN Meds:.ipratropium, levalbuterol, LORazepam, [DISCONTINUED] ondansetron **OR** ondansetron (ZOFRAN) IV, oxyCODONE  Assessment/Plan: 1. Atrial fibrillation/flutter with  RVR. Rate difficult to control secondary to stress of surgery and CHF. Rate improved now on IV cardizem at 15 mg/hr. Failed prior attempts at DCCV/rhythm control. Severe LAE and morbid obesity indicate he is a poor candidate for ablation. Will focus on rate control. Today will increase metoprolol to 150 mg bid. Start oral digoxin .125 mg daily. Start oral diltiazem 60 mg tid and try and taper off IV cardizem. Ideally with CHF it would be nice not to use Cardizem but for now he needs this for  rate control. On coumadin for anticoagulation- managed by pharmacy.  2. Acute on chronic combined systolic/diastolic CHF. EF 45-50%. Exacerbated by rapid heart rate and fluid shifts from surgery. Excellent diuresis yesterday. I/O negative 4.1 liters. Weights are quite variable. Will reduce IV lasix to 40 mg bid today. May need to consider diamox with metabolic alkalosis.  3. OSA  4. 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   Active Problems:   Diverticulitis of colon with perforation   Hypoxia   Atrial fibrillation with rapid ventricular response (HCC)   Systolic dysfunction with acute on chronic heart failure (HCC)   Perforated bowel (HCC)   Respiratory failure (HCC)   Dyspnea   Pleural effusion   Pulmonary edema   Pressure ulcer   Peter Martinique, [place] 01/27/2015 9:19 AM

## 2015-01-27 NOTE — Progress Notes (Signed)
Night Float Interim Progress Note  S:  Paged by RN re: pt with RVR, increased work of breathing, hesitant to use BiPAP.  Extra IV metop at 6:30 did not improve rates much.   RT came by and patient is now on BiPAP with improvement in work of breathing.  Went to see patient and he is resting comfortably in bed watching tv, BiPAP in place.  He denies pain or dyspnea.   O:  T 99.67F, HR 130-150s, 93% on BiPAP, RR 20, BP 118/62 General:  Awake and alert, in no distress, responding appropriately, AAO to person, place, time (month, not year), situation. HEENT:  BiPAP mask in place Heart:  Irregularly irregular, tachycardic, no m/r/g Lungs:  Clear anterior but difficult to hear over BiPAP Abd:  Ostomy bag with soft brown output, midline surg site c/d/i w/ wound vac in place, belly is soft/NT/ND Extremities:  Warm, legs with 1+ edema B/L  A/P:  50 year old male with PMH of afib and dHF admitted with perforated sigmoid colon on 01/14/15 s/p colectomy on 01/17/15.  Afib had been managed on cardizem gtt and metop.  cardizem gtt changed to po today, metop increased and dig added today.  Back in RVR tonight despite extra IV metop.  Also with increased WOB which has responded to BiPAP.  Pleural effusions on CXR yesterday.  Noted that Lasix was decreased today. - Stat CXR - Lasix IV 40mg  once - give 10PM po metoprolol now (1 hour early) - last po Cardizem 60mg  dose was at 6PM, next dose scheduled for 6AM.  Can get an additional po Cardizem overnight if rates still > 110s - continue close monitoring and telemety; RN updated on plan and she will page with changes in patient status

## 2015-01-27 NOTE — Progress Notes (Signed)
ANTICOAGULATION CONSULT NOTE - Follow Up Consult  Pharmacy Consult for Coumadin Indication: atrial fibrillation  Allergies  Allergen Reactions  . Contrast Media [Iodinated Diagnostic Agents]     a diffuse macular rash after CTA chest     Patient Measurements: Height: 5\' 10"  (177.8 cm) Weight: 280 lb (127.007 kg) IBW/kg (Calculated) : 73  Vital Signs: Temp: 98 F (36.7 C) (01/10 1204) Temp Source: Oral (01/10 1204) BP: 111/60 mmHg (01/10 1204) Pulse Rate: 98 (01/10 1204)  Labs:  Recent Labs  01/25/15 0500 01/26/15 0432 01/27/15 0341  HGB 13.4 12.6* 13.1  HCT 43.8 39.8 42.1  PLT 433* 420* 453*  LABPROT 27.5* 51.1* 35.5*  INR 2.61* 5.93* 3.66*  CREATININE 0.70 0.71 0.85    Estimated Creatinine Clearance: 143.8 mL/min (by C-G formula based on Cr of 0.85).  Assessment:  Had been on Pradaxa prior to admission for afib. Changed to Coumadin on 01/21/15.   INR has been labile. Down from 5.93 to 3.66 after holding Coumadin yesterday.  No bleeding noted.  Little PO intake.   Goal of Therapy:  INR 2-3 Monitor platelets by anticoagulation protocol: Yes   Plan:   No Coumadin again today.  Continue daily PT/INR.  Noted plan to change back to Pradaxa at discharge.  Arty Baumgartner, [place] Pager: [phone] 01/27/2015,3:56 PM

## 2015-01-27 NOTE — Plan of Care (Signed)
Problem: Health Behavior/Discharge Planning: Goal: Ability to manage health-related needs will improve Outcome: Progressing Patient engages in educational conversations regarding future care of colostomy and wound. Patient states he is very concerned about becoming a "burden" to his children and that he may be too much to care for. Reinforcement of care techniques and coping mechanisms needed.

## 2015-01-27 NOTE — Evaluation (Signed)
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  Extubated 1/1.  Clinical Impression   Pt admitted with above diagnosis. Pt currently with functional limitations due to the deficits listed below (see PT Problem List).   William Fischer Fischer had been making nice progress with  Functional mobility over the few sessions since eval performed on 1/5; This recent episode of tachycardia has led to decr activity tolerance, and decr functional mobility; Given his complicated hospital course, young age, prior progress and willingness to participate, I believe he has good rehab potential and will benefit from a CIR stay to maximize independence and safety with mobility; Have placed CIR screen and discussed case with Dr. Marijean Bravo;    Pt will continue to benefit from further skilled PT to increase their independence and safety with mobility to allow discharge to the venue listed below.       Follow Up Recommendations CIR    Equipment Recommendations  None recommended by PT (May need home O2 -- will continue to assess)    Recommendations for Other Services Rehab consult;OT consult (Thank you!)     Precautions / Restrictions Precautions Precautions: Fall      Mobility  Bed Mobility Overal bed mobility: Needs Assistance Bed Mobility: Supine to Sit     Supine to sit: Mod assist     General bed mobility comments: Light mod handheld assist to pull to sit; good initiation  Transfers Overall transfer level: Needs assistance Equipment used: Rolling walker (2 wheeled) Transfers: Sit to/from Stand Sit to Stand: Min assist         General transfer comment: cues for hand placement; min assist for initial  steadiness and lines management  Ambulation/Gait Ambulation/Gait assistance: Min guard Ambulation Distance (Feet): 10 Feet Assistive device: Rolling walker (2 wheeled) Gait Pattern/deviations: Step-through pattern;Trunk flexed Gait velocity: slower   General Gait Details: Cues to self-monitor for activity tolerance; Good use of RW for steadiness, with good maneuvering in small relatively crowded room  Stairs            Wheelchair Mobility    Modified Rankin (Stroke Patients Only)       Balance     Sitting balance-Leahy Scale: Fair       Standing balance-Leahy Scale: Fair                               Pertinent Vitals/Pain Pain Assessment: Faces Faces Pain Scale: Hurts little more Pain Location: abdomen Pain Descriptors / Indicators: Grimacing;Discomfort Pain Intervention(s): Monitored during session    Home Living Family/patient expects to be discharged to:: Private residence Living Arrangements: Children (2 sons) Available Help at Discharge: Family (sister is here to assist; she lives in [place]) Type of Home: House Home Access: Stairs to enter Entrance Stairs-Rails: Psychiatric nurse of Steps: 3 Home Layout: One level Home Equipment: Environmental consultant - 2 wheels;Cane - single point;Bedside commode;Shower seat;Other (comment)      Prior Function Level of Independence: Independent         Comments: did not need supplemental O2 prior to admission     Hand Dominance        Extremity/Trunk Assessment   Upper Extremity  Assessment: Defer to OT evaluation           Lower Extremity Assessment: Generalized weakness         Communication   Communication: No difficulties  Cognition Arousal/Alertness: Awake/alert Behavior During Therapy: WFL for tasks assessed/performed;Anxious Overall Cognitive Status: No family/caregiver present to determine baseline cognitive functioning                      General Comments General  comments (skin integrity, edema, etc.): See other note for O2 qualifying walk; HR ranged 100-127 (observed highest)    Exercises        Assessment/Plan    PT Assessment Patient needs continued PT services  PT Diagnosis Generalized weakness;Acute pain   PT Problem List Decreased strength;Decreased activity tolerance;Decreased mobility;Cardiopulmonary status limiting activity;Pain  PT Treatment Interventions Gait training;Stair training;DME instruction;Functional mobility training;Therapeutic activities;Patient/family education   PT Goals (Current goals can be found in the Care Plan section) Acute Rehab PT Goals Patient Stated Goal: Home and independent PT Goal Formulation: With patient Time For Goal Achievement: 02/10/15 Potential to Achieve Goals: Good    Frequency Min 3X/week   Barriers to discharge        Co-evaluation               End of Session Equipment Utilized During Treatment: Oxygen Activity Tolerance: Patient tolerated treatment well Patient left: in chair;with call bell/phone within reach Nurse Communication: Mobility status         Time: [id number] PT Time Calculation (min) (ACUTE ONLY): 29 min   Charges:   PT Evaluation $PT Re-evaluation: 1 Procedure PT Treatments $Gait Training: 8-22 mins   PT G Codes:        Quin Hoop 01/27/2015, 2:14 PM  Roney [place], [place] Pager [phone] Office [phone]

## 2015-01-27 NOTE — Progress Notes (Signed)
Pt HR in 130-160's. Gave pt pm dose of cardizem early. Encouraged pt to wear bipap. Pt educated of importance of bipap. Pt stated "I am all right I do not need to wear it now, maybe I can wear it tomorrow." Pt educated on fluid restriction. VSS. Will continue to monitor.   Ruben Reason, RN

## 2015-01-27 NOTE — Progress Notes (Signed)
RT note: RT called to patients room for patient respiratory status> Patient had increased WOB respiratory rate of 42 was pail and spoke in short sentences. RT placed patient on BIPAP and titrated to IPAP 10 EPAP of 5 titrated FIO2 of 60% per sats of 92% and HR 140.

## 2015-01-27 NOTE — Progress Notes (Signed)
UR Completed Shawnette Augello Graves-Bigelow, RN,BSN [phone]  

## 2015-01-27 NOTE — Progress Notes (Signed)
Pt HR in 150-180's. Notified Lyda Jester, [place]. New orders received. Pt agreeable to put on bipap. RT called to evaluate pt. Pt now on bipap, oxygen sat 98%. Paged MD, awaiting return phone call. VSS. Will continue to monitor.   Ruben Reason, RN

## 2015-01-28 ENCOUNTER — Inpatient Hospital Stay (HOSPITAL_COMMUNITY): Payer: Medicaid Other

## 2015-01-28 ENCOUNTER — Encounter (HOSPITAL_COMMUNITY): Payer: Self-pay | Admitting: Radiology

## 2015-01-28 DIAGNOSIS — R0682 Tachypnea, not elsewhere classified: Secondary | ICD-10-CM | POA: Insufficient documentation

## 2015-01-28 DIAGNOSIS — K631 Perforation of intestine (nontraumatic): Secondary | ICD-10-CM | POA: Insufficient documentation

## 2015-01-28 LAB — CBC
HEMATOCRIT: 41.4 % (ref 39.0–52.0)
Hemoglobin: 13.5 g/dL (ref 13.0–17.0)
MCH: 31.3 pg (ref 26.0–34.0)
MCHC: 32.6 g/dL (ref 30.0–36.0)
MCV: 96.1 fL (ref 78.0–100.0)
PLATELETS: 443 10*3/uL — AB (ref 150–400)
RBC: 4.31 MIL/uL (ref 4.22–5.81)
RDW: 15.6 % — AB (ref 11.5–15.5)
WBC: 17.5 10*3/uL — AB (ref 4.0–10.5)

## 2015-01-28 LAB — BASIC METABOLIC PANEL
ANION GAP: 8 (ref 5–15)
BUN: 16 mg/dL (ref 6–20)
CALCIUM: 8.2 mg/dL — AB (ref 8.9–10.3)
CO2: 42 mmol/L — AB (ref 22–32)
Chloride: 88 mmol/L — ABNORMAL LOW (ref 101–111)
Creatinine, Ser: 0.74 mg/dL (ref 0.61–1.24)
GFR calc Af Amer: >60 mL/min (ref >60)
GLUCOSE: 125 mg/dL — AB (ref 65–99)
Potassium: 3.2 mmol/L — ABNORMAL LOW (ref 3.5–5.1)
Sodium: 138 mmol/L (ref 135–145)

## 2015-01-28 LAB — SURGICAL PCR SCREEN
MRSA, PCR: NEGATIVE
Staphylococcus aureus: NEGATIVE

## 2015-01-28 LAB — LACTIC ACID, PLASMA: Lactic Acid, Venous: 3.2 mmol/L (ref 0.5–2.0)

## 2015-01-28 LAB — GLUCOSE, CAPILLARY
GLUCOSE-CAPILLARY: 329 mg/dL — AB (ref 65–99)
GLUCOSE-CAPILLARY: 283 mg/dL — AB (ref 65–99)

## 2015-01-28 LAB — PROTIME-INR
INR: 3.47 — AB (ref 0.00–1.49)
PROTHROMBIN TIME: 34.1 s — AB (ref 11.6–15.2)

## 2015-01-28 LAB — PROCALCITONIN: PROCALCITONIN: 0.38 ng/mL

## 2015-01-28 MED ORDER — VITAMIN K1 10 MG/ML IJ SOLN
10.0000 mg | Freq: Once | INTRAVENOUS | Status: AC
  Administered 2015-01-28: 10 mg via INTRAVENOUS
  Filled 2015-01-28: qty 1

## 2015-01-28 MED ORDER — DIPHENHYDRAMINE HCL 25 MG PO CAPS: 50.0000 mg | ORAL_CAPSULE | Freq: Once | ORAL | Status: DC

## 2015-01-28 MED ORDER — IOHEXOL 300 MG/ML  SOLN
25.0000 mL | INTRAMUSCULAR | Status: AC
  Administered 2015-01-28 (×2): 25 mL via ORAL

## 2015-01-28 MED ORDER — DILTIAZEM HCL 60 MG PO TABS
90.0000 mg | ORAL_TABLET | Freq: Three times a day (TID) | ORAL | Status: DC
  Administered 2015-01-28 – 2015-01-30 (×7): 90 mg via ORAL
  Filled 2015-01-28 (×7): qty 1

## 2015-01-28 MED ORDER — DIPHENHYDRAMINE HCL 25 MG PO CAPS
50.0000 mg | ORAL_CAPSULE | Freq: Once | ORAL | Status: AC
  Administered 2015-01-28: 50 mg via ORAL
  Filled 2015-01-28: qty 2

## 2015-01-28 MED ORDER — PIPERACILLIN-TAZOBACTAM 3.375 G IVPB
3.3750 g | Freq: Three times a day (TID) | INTRAVENOUS | Status: DC
Start: 2015-01-28 — End: 2015-02-02
  Administered 2015-01-28 – 2015-02-02 (×15): 3.375 g via INTRAVENOUS
  Filled 2015-01-28 (×17): qty 50

## 2015-01-28 MED ORDER — IOHEXOL 300 MG/ML  SOLN
100.0000 mL | Freq: Once | INTRAMUSCULAR | Status: AC | PRN
  Administered 2015-01-28: 100 mL via INTRAVENOUS

## 2015-01-28 MED ORDER — CHLORHEXIDINE GLUCONATE CLOTH 2 % EX PADS
6.0000 | MEDICATED_PAD | Freq: Every day | CUTANEOUS | Status: AC
  Administered 2015-01-28 – 2015-01-29 (×2): 6 via TOPICAL

## 2015-01-28 MED ORDER — PHYTONADIONE 5 MG PO TABS
10.0000 mg | ORAL_TABLET | Freq: Once | ORAL | Status: DC
  Filled 2015-01-28: qty 2

## 2015-01-28 MED ORDER — POTASSIUM CHLORIDE CRYS ER 20 MEQ PO TBCR
40.0000 meq | EXTENDED_RELEASE_TABLET | Freq: Once | ORAL | Status: AC
  Administered 2015-01-28: 40 meq via ORAL
  Filled 2015-01-28: qty 2

## 2015-01-28 MED ORDER — INSULIN ASPART 100 UNIT/ML ~~LOC~~ SOLN
0.0000 [IU] | Freq: Three times a day (TID) | SUBCUTANEOUS | Status: DC
Start: 2015-01-29 — End: 2015-02-01
  Administered 2015-01-29 – 2015-01-30 (×3): 2 [IU] via SUBCUTANEOUS
  Administered 2015-01-30: 1 [IU] via SUBCUTANEOUS
  Administered 2015-01-30: 2 [IU] via SUBCUTANEOUS
  Administered 2015-01-31: 1 [IU] via SUBCUTANEOUS
  Administered 2015-01-31: 2 [IU] via SUBCUTANEOUS
  Administered 2015-01-31: 1 [IU] via SUBCUTANEOUS

## 2015-01-28 MED ORDER — WARFARIN - PHARMACIST DOSING INPATIENT
Freq: Every day | Status: DC
Start: 2015-01-28 — End: 2015-01-28

## 2015-01-28 MED ORDER — METHYLPREDNISOLONE SODIUM SUCC 125 MG IJ SOLR
125.0000 mg | Freq: Once | INTRAMUSCULAR | Status: AC
  Administered 2015-01-28: 125 mg via INTRAVENOUS
  Filled 2015-01-28: qty 2

## 2015-01-28 NOTE — Consult Note (Signed)
WOC wound follow-up consult note Reason for Consult: VAC dressing change to midline abdominal wound.  Wound type: post-op full thickness surgical wound  Wound [id number] tissue, 85% red, 15% yellow interspersed throughout Drainage (amount, consistency, odor) mod amt yellow serosanguinous drainage, no odor Periwound: intact  Dressing procedure/placement/frequency: 1 pc of black foam used to fill the wound bed, sealed at 155mmHG cont suction. Pt tolerated well.  [place] team will change Q M/W/F.  WOC ostomy consult note Reason for Consult: Pouch change demonstration performed for patient  Stoma red and viable, 50% above skin level, 50% flush with skin level from 6:00 o'clock to 11:00 o'clock, 1 1/4 inches  Applied barrier ring and 2 piece pouching appliance to maintain seal. Mod amt brown stool in pouch Demonstrated pouch change using barrier ring and 2 piece pouching system. Pt watched and asked appropriate questions. He was able to open and close the velcro. Supplies at the bedside for staff nurse use  Julien Girt MSN, [place], [place], [place], [place]

## 2015-01-28 NOTE — Progress Notes (Signed)
Patient ID: William Fischer, male   DOB: 09/13/1965, 50 y.o.   MRN: [id number] 11 Days Post-Op  Subjective: Pt feels well with no complaints.  Tolerating a regular diet.    Objective: Vital signs in last 24 hours: Temp:  [97.5 F (36.4 C)-99.1 F (37.3 C)] 98.8 F (37.1 C) (01/11 1333) Pulse Rate:  [103-171] 109 (01/11 1141) Resp:  [21-40] 21 (01/11 1141) BP: (101-136)/(56-83) 101/79 mmHg (01/11 1317) SpO2:  [90 %-98 %] 95 % (01/11 1141) Weight:  [127.234 kg (280 lb 8 oz)] 127.234 kg (280 lb 8 oz) (01/11 0623) Last BM Date: 01/28/15  Intake/Output from previous day: 01/10 0701 - 01/11 0700 In: 1261.3 [P.O.:1200; I.V.:61.3] Out: 4590 [Urine:4140; Stool:450] Intake/Output this shift: Total I/O In: 240 [P.O.:240] Out: 150 [Urine:150]  PE: Abd: soft, NT, ND, midline wound with VAC in place, colostomy working with good air and feculent output.  Lab Results:   Recent Labs  01/27/15 0341 01/28/15 0442  WBC 11.5* 17.5*  HGB 13.1 13.5  HCT 42.1 41.4  PLT 453* 443*   BMET  Recent Labs  01/27/15 0341 01/28/15 0442  NA 140 138  K 3.9 3.2*  CL 85* 88*  CO2 47* 42*  GLUCOSE 187* 125*  BUN 14 16  CREATININE 0.85 0.74  CALCIUM 8.2* 8.2*   PT/INR  Recent Labs  01/27/15 0341 01/28/15 0442  LABPROT 35.5* 34.1*  INR 3.66* 3.47*   CMP     Component Value Date/Time   NA 138 01/28/2015 0442   K 3.2* 01/28/2015 0442   CL 88* 01/28/2015 0442   CO2 42* 01/28/2015 0442   GLUCOSE 125* 01/28/2015 0442   BUN 16 01/28/2015 0442   CREATININE 0.74 01/28/2015 0442   CALCIUM 8.2* 01/28/2015 0442   PROT 4.9* 01/19/2015 0445   ALBUMIN 1.4* 01/19/2015 0445   AST 47* 01/19/2015 0445   ALT 26 01/19/2015 0445   ALKPHOS 67 01/19/2015 0445   BILITOT 1.1 01/19/2015 0445   GFRNONAA >60 01/28/2015 0442   GFRAA >60 01/28/2015 0442   Lipase     Component Value Date/Time   LIPASE 18 01/14/2015 0906       Studies/Results: Ct Chest W Contrast  01/28/2015  CLINICAL DATA:   Abscess.  Perforated diverticulitis. EXAM: CT CHEST, ABDOMEN, AND PELVIS WITH CONTRAST TECHNIQUE: Multidetector CT imaging of the chest, abdomen and pelvis was performed following the standard protocol during bolus administration of intravenous contrast. CONTRAST:  1109mL OMNIPAQUE IOHEXOL 300 MG/ML  SOLN COMPARISON:  01/17/2015 FINDINGS: CT CHEST Mediastinum: The heart size is mildly enlarged. No pericardial effusion identified. The trachea appears patent and is midline. Unremarkable appearance of the esophagus. Multiple small lymph nodes are noted within the mediastinum. Likely reactive. No adenopathy identified. No supraclavicular or axillary adenopathy. Lungs/Pleura: There is a moderate volume left pleural effusion. A small loculated right pleural effusion is identified, image 85 of series 5. Atelectasis is noted within the right middle lobe and right lower lobe. There is airspace consolidation involving the left lower lobe. Musculoskeletal: No aggressive lytic or sclerotic bone lesions are identified. CT ABDOMEN AND PELVIS Hepatobiliary: There is a low density structure within the posterior right lobe of liver measuring 11 mm, image 52 of series 2. This is unchanged from previous exam. The gallbladder is normal. No biliary dilatation. Pancreas: Negative Spleen: Negative Adrenals/Urinary Tract: The normal appearance of the adrenal glands. The kidneys are unremarkable. Urinary bladder is negative. Stomach/Bowel: Stomach has a normal caliber. There is no abnormal pathologic dilatation  of the small bowel loops. The appendix is visualized and appears normal. The proximal scratch set there is a right sided colostomy. The proximal large bowel loops have a normal caliber. There is been dehiscence of the Hartman's pouch along the suture line. There is an associated large complex fluid collection involving the left side of abdomen measuring 13.5 x 8.3 by 30.9 cm. This extends into the upper abdomen an abuts the greater  curvature of the stomach. There are multiple smaller fluid collections scratch set there are smaller collections of gas and fluid identified elsewhere within the abdomen particularly along the left pericolic gutter and retroperitoneum and extending over the spleen. No significant fluid collections identified within the right side of abdomen. Vascular/Lymphatic: Normal appearance of the abdominal aorta. No enlarged retroperitoneal or mesenteric adenopathy. No enlarged pelvic or inguinal lymph nodes. Reproductive: The prostate gland and seminal vesicles appear normal. Other: Multiple fluid collections secondary to dehiscence of Hartman's pouch identified. Musculoskeletal: No aggressive lytic or sclerotic bone lesions. IMPRESSION: 1. Examination is significant for dehiscence of the Hartman's pouch along the suture line. There is a resultant large complex fluid collection involving much of the left hemi abdomen. Multiple smaller gas and fluid collections are identified scattered throughout the left abdomen especially within the left pericolic gutter and retroperitoneal space posterior to the left kidney. 2. Partially loculated right pleural effusion. 3. Moderate left effusion with overlying airspace consolidation. Critical Value/emergent results were called by telephone at the time of interpretation on 01/28/2015 at 1:41 pm to Dr. Hulen Luster, who verbally acknowledged these results. Electronically Signed   By: Kerby Moors M.D.   On: 01/28/2015 13:41   Ct Abdomen Pelvis W Contrast  01/28/2015  CLINICAL DATA:  Abscess.  Perforated diverticulitis. EXAM: CT CHEST, ABDOMEN, AND PELVIS WITH CONTRAST TECHNIQUE: Multidetector CT imaging of the chest, abdomen and pelvis was performed following the standard protocol during bolus administration of intravenous contrast. CONTRAST:  134mL OMNIPAQUE IOHEXOL 300 MG/ML  SOLN COMPARISON:  01/17/2015 FINDINGS: CT CHEST Mediastinum: The heart size is mildly enlarged. No pericardial  effusion identified. The trachea appears patent and is midline. Unremarkable appearance of the esophagus. Multiple small lymph nodes are noted within the mediastinum. Likely reactive. No adenopathy identified. No supraclavicular or axillary adenopathy. Lungs/Pleura: There is a moderate volume left pleural effusion. A small loculated right pleural effusion is identified, image 85 of series 5. Atelectasis is noted within the right middle lobe and right lower lobe. There is airspace consolidation involving the left lower lobe. Musculoskeletal: No aggressive lytic or sclerotic bone lesions are identified. CT ABDOMEN AND PELVIS Hepatobiliary: There is a low density structure within the posterior right lobe of liver measuring 11 mm, image 52 of series 2. This is unchanged from previous exam. The gallbladder is normal. No biliary dilatation. Pancreas: Negative Spleen: Negative Adrenals/Urinary Tract: The normal appearance of the adrenal glands. The kidneys are unremarkable. Urinary bladder is negative. Stomach/Bowel: Stomach has a normal caliber. There is no abnormal pathologic dilatation of the small bowel loops. The appendix is visualized and appears normal. The proximal scratch set there is a right sided colostomy. The proximal large bowel loops have a normal caliber. There is been dehiscence of the Hartman's pouch along the suture line. There is an associated large complex fluid collection involving the left side of abdomen measuring 13.5 x 8.3 by 30.9 cm. This extends into the upper abdomen an abuts the greater curvature of the stomach. There are multiple smaller fluid collections scratch set there are  smaller collections of gas and fluid identified elsewhere within the abdomen particularly along the left pericolic gutter and retroperitoneum and extending over the spleen. No significant fluid collections identified within the right side of abdomen. Vascular/Lymphatic: Normal appearance of the abdominal aorta. No  enlarged retroperitoneal or mesenteric adenopathy. No enlarged pelvic or inguinal lymph nodes. Reproductive: The prostate gland and seminal vesicles appear normal. Other: Multiple fluid collections secondary to dehiscence of Hartman's pouch identified. Musculoskeletal: No aggressive lytic or sclerotic bone lesions. IMPRESSION: 1. Examination is significant for dehiscence of the Hartman's pouch along the suture line. There is a resultant large complex fluid collection involving much of the left hemi abdomen. Multiple smaller gas and fluid collections are identified scattered throughout the left abdomen especially within the left pericolic gutter and retroperitoneal space posterior to the left kidney. 2. Partially loculated right pleural effusion. 3. Moderate left effusion with overlying airspace consolidation. Critical Value/emergent results were called by telephone at the time of interpretation on 01/28/2015 at 1:41 pm to Dr. Hulen Luster, who verbally acknowledged these results. Electronically Signed   By: Kerby Moors M.D.   On: 01/28/2015 13:41   Dg Chest Port 1 View  01/27/2015  CLINICAL DATA:  Respiratory distress. EXAM: PORTABLE CHEST 1 VIEW COMPARISON:  January 26, 2015. FINDINGS: Stable cardiomegaly and central pulmonary vascular congestion is noted. Bilateral perihilar and basilar interstitial densities are noted concerning for edema. Stable bilateral pleural effusions are noted, with left greater than right. No pneumothorax is noted. IMPRESSION: Stable bilateral opacities most consistent with edema with associated pleural effusions. Electronically Signed   By: Marijo Conception, M.D.   On: 01/27/2015 21:19    Anti-infectives: Anti-infectives    Start     Dose/Rate Route Frequency Ordered Stop   01/28/15 1500  piperacillin-tazobactam (ZOSYN) IVPB 3.375 g     3.375 g 12.5 mL/hr over 240 Minutes Intravenous 3 times per day 01/28/15 1409     01/18/15 0400  levofloxacin (LEVAQUIN) IVPB 750 mg  Status:   Discontinued     750 mg 100 mL/hr over 90 Minutes Intravenous Every 24 hours 01/18/15 0210 01/21/15 1108   01/18/15 0215  meropenem (MERREM) 1 g in sodium chloride 0.9 % 100 mL IVPB  Status:  Discontinued     1 g 200 mL/hr over 30 Minutes Intravenous 3 times per day 01/18/15 0210 01/26/15 0822   01/16/15 0930  ertapenem (INVANZ) 1 g in sodium chloride 0.9 % 50 mL IVPB  Status:  Discontinued     1 g 100 mL/hr over 30 Minutes Intravenous Every 24 hours 01/16/15 0841 01/18/15 0203   01/14/15 1245  piperacillin-tazobactam (ZOSYN) IVPB 3.375 g  Status:  Discontinued     3.375 g 12.5 mL/hr over 240 Minutes Intravenous 3 times per day 01/14/15 1238 01/16/15 0841   01/14/15 1230  ciprofloxacin (CIPRO) IVPB 400 mg  Status:  Discontinued     400 mg 200 mL/hr over 60 Minutes Intravenous  Once 01/14/15 1218 01/14/15 1238   01/14/15 1230  metroNIDAZOLE (FLAGYL) IVPB 500 mg  Status:  Discontinued     500 mg 100 mL/hr over 60 Minutes Intravenous  Once 01/14/15 1218 01/14/15 1238       Assessment/Plan 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 Donne Hazel  -WOC for ostomy teaching -VAC changes MWF -mobilize, IS,encourage PO intake.  -WBC increase today 17.5K today.  CT scan reveals that his rectal stump has blown out.  I have contacted the medical team so they can reverse his INR  so we can plan to take him back to the OR tomorrow to correct this problem.  He will need to be fairly aggressively reversed so we can do this tomorrow and we are not waiting around for several days to do this, but he is not unstable to the point where he needs something as expensive and aggressive as K-centra.   -this has been discussed with the patient and he is agreeable. -started patient on zosyn ID-meropenem 1/1---> stop, zosyn 1-11 --> DVT prophylaxis-being reversed by primary service Dispo-to OR tomorrow   LOS: 14 days    Donnajean Chesnut E 01/28/2015, 2:19 PM Pager: [phone]

## 2015-01-28 NOTE — Progress Notes (Signed)
IMTS Night Team Progress Note  S: We checked in on Mr. Krummen at 8:30pm, who appeared comfortable, with only mild-moderate abdominal pain that was stable, occasionally diaphoretic and breathing comfortably on 2L [place]. He is having no new symptoms and feels 'about the same' as he did throughout yesterday and today. He did feel diaphoretic near the end of [place] stopping in, so we obtained a temp which was 98'.  O: Afeb, HR 120s in afib, BP 120s/60s, 92%+ on 2L [place] Gen: Alert and oriented to person, place, and time CV: tachycardic rate, irregularly irregular rhythm, no murmurs, rubs, or gallops Pulmonary: Normal effort, CTA bilaterally, no wheezing, rales, or rhonchi Abdominal: Wound vac in place, abdomen distended, mild tenderness to palpation, without rebound or guarding, BS+ Extremities: Distal pulses 2+ in upper and lower extremities bilaterally, 2+ pitting edema to mid-calves bilaterally  A/P:  -No new orders currently -NPO at midnight; warfarin held + vit K given earlier today for INR reversal. Will f/u AM INR -Instructed pt, nursing to contact us if anything changes clinically

## 2015-01-28 NOTE — Evaluation (Signed)
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  Extubated 1/1.   Clinical Impression   Patient presenting with decreased ADL and functional mobility independence secondary to above. Patient independent to mod I PTA. Patient currently functioning at an overall min assist level for functional mobility and up to total assist for LB ADLs. Patient will benefit from acute OT to increase overall independence in the areas of ADLs, functional mobility, and overall safety in order to safely discharge to venue listed below. IF CIR continues to deny, pt will need post acute rehab, ST SNF for safety prior to discharge back home.    Follow Up Recommendations  CIR;Supervision/Assistance - 24 hour    Equipment Recommendations  Other (comment) (TBD)    Recommendations for Other Services  None at this time   Precautions / Restrictions Precautions Precautions: Fall Restrictions Weight Bearing Restrictions: No    Mobility Bed Mobility Overal bed mobility: Needs Assistance Bed Mobility: Supine to Sit Rolling: Supervision   Supine to sit: Min guard     General bed mobility comments: heavy use of bed rails and HOB slightly elevated   Transfers Overall transfer level: Needs assistance Equipment used: Rolling walker (2 wheeled) Transfers: Sit to/from Stand Sit to Stand: Min assist         General transfer comment: cues for hand placement; min assist for initial steadiness and lines management    Balance Overall balance assessment: Needs assistance Sitting-balance support: No upper extremity supported;Feet supported Sitting balance-Leahy Scale: Good     Standing balance support:  Bilateral upper extremity supported;During functional activity Standing balance-Leahy Scale: Fair    ADL Overall ADL's : Needs assistance/impaired Eating/Feeding: Set up;Bed level   Grooming: Set up;Bed level   Upper Body Bathing: Minimal assitance;Sitting   Lower Body Bathing: Total assistance;Sit to/from stand   Upper Body Dressing : Minimal assistance;Sitting   Lower Body Dressing: Total assistance;Sit to/from stand       Toileting- Water quality scientist and Hygiene: Total assistance;Sit to/from Nurse, children's Details (indicate cue type and reason): did not occur Functional mobility during ADLs: Min guard;Cueing for safety;Rolling walker General ADL Comments: Pt requries increased assistance secondary to decreased cardiopulmonary support, generalized weakness, and body habitus.     Pertinent Vitals/Pain Pain Assessment: Faces Faces Pain Scale: Hurts little more Pain Location: abdomen Pain Descriptors / Indicators: Grimacing;Discomfort Pain Intervention(s): Monitored during session     Hand Dominance Right   Extremity/Trunk Assessment Upper Extremity Assessment Upper Extremity Assessment: Generalized weakness   Lower Extremity Assessment Lower Extremity Assessment: Generalized weakness       Communication Communication Communication: No difficulties   Cognition Arousal/Alertness: Awake/alert Behavior During Therapy: WFL for tasks assessed/performed;Anxious Overall Cognitive Status: No family/caregiver present to determine baseline cognitive functioning (pt presented with poor safety awareness and emergent awareness)             Home Living Family/patient expects to be discharged to:: Private residence Living Arrangements: Children (2 sons) Available Help at Discharge: Family (sister is here to assist, she lives in [place]) Type of Home: House Home Access: Stairs to enter Technical brewer of Steps: 3 Entrance Stairs-Rails:  Right;Left Home Layout: One level     Bathroom  Shower/Tub: Walk-in shower;Door (pt reports he also has a garden tub that he normally sits in for baths)   Bathroom Toilet: Handicapped height     Home Equipment: Environmental consultant - 2 wheels;Cane - single point;Bedside commode;Shower seat   Additional Comments: Home equipment entered from PT, when pt working with OT he did not mention any equipment      Prior Functioning/Environment Level of Independence: Independent        Comments: did not need supplemental O2 prior to admission    OT Diagnosis: Generalized weakness;Acute pain   OT Problem List: Decreased strength;Decreased activity tolerance;Impaired balance (sitting and/or standing);Decreased safety awareness;Decreased cognition;Decreased knowledge of use of DME or AE;Decreased knowledge of precautions;Pain;Cardiopulmonary status limiting activity   OT Treatment/Interventions: Self-care/ADL training;Therapeutic exercise;Energy conservation;DME and/or AE instruction;Therapeutic activities;Patient/family education;Balance training    OT Goals(Current goals can be found in the care plan section) Acute Rehab OT Goals Patient Stated Goal: get stronger OT Goal Formulation: With patient Time For Goal Achievement: 02/11/15 Potential to Achieve Goals: Good ADL Goals Pt Will Perform Grooming: with supervision;standing Pt Will Perform Lower Body Bathing: with supervision;sit to/from stand;with adaptive equipment Pt Will Perform Lower Body Dressing: with supervision;with adaptive equipment;sit to/from stand Pt Will Transfer to Toilet: with supervision;bedside commode;ambulating Additional ADL Goal #1: Pt will be supervision with functional mobility using LRAD  OT Frequency: Min 2X/week   Barriers to D/C: none known at this time    End of Session Equipment Utilized During Treatment: Rolling walker;Oxygen (3L) Nurse Communication: Mobility status;Other (comment) (condom cath came off and pt  requesting NT donn a new one)  Activity Tolerance: Patient tolerated treatment well Patient left: in chair;with call bell/phone within reach   Time: 1030-1058 OT Time Calculation (min): 28 min  Charges:  OT General Charges $OT Visit: 1 Procedure OT Evaluation $OT Eval Moderate Complexity: 1 Procedure OT Treatments $Self Care/Home Management : 8-22 mins  Chrys Racer , MS, OTR/L, CLT Pager: [phone]  01/28/2015, 11:35 AM

## 2015-01-28 NOTE — Progress Notes (Signed)
Pt c/o pain in ABD. Assessed pt had become diaphoretic, delayed cap refill and +2 pitting edema in lower extremities. Blood glucose was 329. VSS. Pt given PO pain medication for ABD pain. Notified MD. Will continue to monitor.   Ruben Reason, RN

## 2015-01-28 NOTE — Progress Notes (Signed)
Subjective:  Patient had an episode of shortness of breath requiring BiPAP last night with HRs in the 170s. He was given 40 mg IV lasix once IV and his metoprolol 150 mg was given early. He denies any abdominal pain this morning, but he notices he has been sweating more.  Objective: Vital signs in last 24 hours: Filed Vitals:   01/28/15 0623 01/28/15 0624 01/28/15 0810 01/28/15 0817  BP:  115/77 125/70   Pulse:  106 127   Temp:  98 F (36.7 C)  98.5 F (36.9 C)  TempSrc:  Oral  Oral  Resp:  24    Height:      Weight: 280 lb 8 oz (127.234 kg)     SpO2:  90%     Weight change: 8 oz (0.227 kg)  Intake/Output Summary (Last 24 hours) at 01/28/15 0857 Last data filed at 01/28/15 [id number]  Gross per 24 hour  Intake 901.25 ml  Output   3575 ml  Net -2673.75 ml   Physical Exam General: Diaphoretic, appearing uncomfortable.  Cardiovascular: Irregularly, irregular rhythm, no m/r/g. Tachycardic. Pulmonary: clear to anterior auscultation. No crackles today. Abdominal: Colostomy bag in place. Midline surgical site w/ wound vac. Wound site clean and dry. No abdominal or retroperitoneal tenderness. Says his left flank feels "sensitive" to touch Extremities: No clubbing, cyanosis, or edema. Psychiatric: Appearing anxious.  Lab Results: Basic Metabolic Panel:  Recent Labs Lab 01/27/15 0341 01/28/15 0442  NA 140 138  K 3.9 3.2*  CL 85* 88*  CO2 47* 42*  GLUCOSE 187* 125*  BUN 14 16  CREATININE 0.85 0.74  CALCIUM 8.2* 8.2*   CBC:  Recent Labs Lab 01/27/15 0341 01/28/15 0442  WBC 11.5* 17.5*  HGB 13.1 13.5  HCT 42.1 41.4  MCV 99.1 96.1  PLT 453* 443*    CBG:  Recent Labs Lab 01/21/15 1128 01/21/15 1917 01/21/15 2332 01/22/15 0333 01/22/15 0732 01/24/15 1208  GLUCAP 125* 207* 135* 101* 102* 186*   Coagulation:  Recent Labs Lab 01/25/15 0500 01/26/15 0432 01/27/15 0341 01/28/15 0442  LABPROT 27.5* 51.1* 35.5* 34.1*  INR 2.61* 5.93* 3.66* 3.47*    Micro Results: No results found for this or any previous visit (from the past 240 hour(s)). Studies/Results: Dg Chest Port 1 View  01/27/2015  CLINICAL DATA:  Respiratory distress. EXAM: PORTABLE CHEST 1 VIEW COMPARISON:  January 26, 2015. FINDINGS: Stable cardiomegaly and central pulmonary vascular congestion is noted. Bilateral perihilar and basilar interstitial densities are noted concerning for edema. Stable bilateral pleural effusions are noted, with left greater than right. No pneumothorax is noted. IMPRESSION: Stable bilateral opacities most consistent with edema with associated pleural effusions. Electronically Signed   By: Marijo Conception, M.D.   On: 01/27/2015 21:19   Dg Chest Port 1 View  01/26/2015  CLINICAL DATA:  Pulmonary edema and shortness of breath EXAM: PORTABLE CHEST 1 VIEW COMPARISON:  Yesterday FINDINGS: Unchanged hazy appearance of the bilateral chest, left more than right, compatible with effusions. The left effusion is likely moderate volume. There is underlying symmetric interstitial and airspace opacity. Chronic cardiomegaly and stable vascular pedicle widening, accentuated by rightward rotation. No pneumothorax. IMPRESSION: Unchanged CHF pattern including left more than right pleural effusion. Electronically Signed   By: Monte Fantasia M.D.   On: 01/26/2015 11:29   Medications: I have reviewed the patient's current medications. Scheduled Meds: . antiseptic oral rinse  7 mL Mouth Rinse BID  . digoxin  0.125 mg Oral Daily  .  diltiazem  60 mg Oral 3 times per day  . feeding supplement  1 Container Oral TID BM  . furosemide  40 mg Intravenous BID  . Gerhardt's butt cream   Topical Daily  . metoprolol tartrate  150 mg Oral BID  . oxyCODONE  20 mg Oral Q12H  . pantoprazole  40 mg Oral Q1200   Continuous Infusions: . sodium chloride 10 mL/hr at 01/23/15 0700   PRN Meds:.ipratropium, levalbuterol, LORazepam, [DISCONTINUED] ondansetron **OR** ondansetron (ZOFRAN) IV,  oxyCODONE Assessment/Plan:  Perforated Bowel s/p left colectomy and colostomy: wound vac in place, gen sx recommend d/c vac on discharge or transition to wet to dry dressings if SNF does not manage wound vac. In the setting of his recurrent dyspnea, difficult to control rates, rising WBC count, he will need repeat CT imaging to query a newly forming abscess, another perfomation, or bowel ischemia. Abdomen is non-tender on exam, but patient has an extremely high pain threshold. I spoke with radiologist Dr. Weber Cooks, who recommended pre-treatment with pre-treatment with benadryl prior to his contrast-based study, given his allergy of rash to contrast. CT chest will also be obtained to assess other causes of dyspnea.  - CT Abdomen and Chest w/ contrast today, benadryl 50 mg po, 125 mg IV solumedrol to given 1 hour before contrast study to prevent rash. - oxycontin 20mg  BID scheduled for pain- goal of relief of pain to 4/10. Oxycodone 5mg  q4h prn for breakthrough pain.  - Levaquin (1/1 - 1/4 discontinued), Meropenem 1 mg q8h (1/1-1/9)  Atrial Fibrillation with RVR: HR in 170s last night, in 120s this AM. INR: 3.47. Patient is on warfarin now, but on pradaxa at home.  Cardiology is following.  - Lopressor 150 mg BID, HRs in the 120s this AM. - Oral diltiazem 90 mg TID - digoxin 0.125 mg po daily - Warfarin per pharm,  transition to pradaxa on d/c  Hypoxia secondary to pulmonary edema: ABG this yesterday improved from earlier w/ normalization of pH. Repeat CXR last night is stable. Still requiring 3LNC and intermittent BiPAP. RVR is likely contributor to pulmonary edema. - lasix 40 mg IV BID - BiPAP prn - Wean O2 as tolerated - Atrovent and Xopenex nebulizer q2h prn  Anxiety: Ativan 0.5 mg IV BID  DVT prophylaxis: Warfarin  Dispo: Disposition is deferred at this time, awaiting improvement of current medical problems.  Anticipated discharge in approximately 2-3 day(s).   The patient does have a  current PCP Thompson Grayer, MD) and does not need an [facility] hospital follow-up appointment after discharge. Will set pt up with IMTS clinic for follow up.   The patient does have transportation limitations that hinder transportation to clinic appointments.  .Services Needed at time of discharge: Y = Yes, Blank = No PT:   OT:   RN:   Equipment:   Other:     LOS: 14 days   Liberty Handy, MD 01/28/2015, 8:57 AM

## 2015-01-28 NOTE — Progress Notes (Addendum)
ANTICOAGULATION CONSULT NOTE - Follow Up Consult  Pharmacy Consult for Coumadin Indication: atrial fibrillation  Allergies  Allergen Reactions  . Contrast Media [Iodinated Diagnostic Agents]     a diffuse macular rash after CTA chest     Patient Measurements: Height: 5\' 10"  (177.8 cm) Weight: 280 lb 8 oz (127.234 kg) IBW/kg (Calculated) : 73  Vital Signs: Temp: 98.2 F (36.8 C) (01/11 1141) Temp Source: Oral (01/11 1141) BP: 106/75 mmHg (01/11 1141) Pulse Rate: 109 (01/11 1141)  Labs:  Recent Labs  01/26/15 0432 01/27/15 0341 01/28/15 0442  HGB 12.6* 13.1 13.5  HCT 39.8 42.1 41.4  PLT 420* 453* 443*  LABPROT 51.1* 35.5* 34.1*  INR 5.93* 3.66* 3.47*  CREATININE 0.71 0.85 0.74    Estimated Creatinine Clearance: 152.9 mL/min (by C-G formula based on Cr of 0.74).  Assessment: Had been on Pradaxa prior to admission for afib. Changed to Coumadin on 01/21/15.  INR 3.47 today No bleeding noted.  Little PO intake.  Goal of Therapy:  INR 2-3 Monitor platelets by anticoagulation protocol: Yes   Plan:  D/C warfarin and patient to go back to dabigatran Dabigatran will restart when INR < 2 Daily INR  Levester Fresh, PharmD, BCPS, [facility] Clinical Pharmacist Pager [phone] 01/28/2015 1:23 PM

## 2015-01-28 NOTE — Progress Notes (Signed)
Subjective: Significant increased work of breathing and SOB last night. Patient anxious. HR increased to 160s with respiratory distress. Received extra lasix dose and place on BIPAP with improvement.   Objective: Vital signs in last 24 hours: Temp:  [97.5 F (36.4 C)-99.1 F (37.3 C)] 98.5 F (36.9 C) (01/11 0817) Pulse Rate:  [98-171] 127 (01/11 0810) Resp:  [22-40] 24 (01/11 0624) BP: (111-136)/(56-83) 125/70 mmHg (01/11 0810) SpO2:  [90 %-98 %] 90 % (01/11 0624) Weight:  [127.234 kg (280 lb 8 oz)] 127.234 kg (280 lb 8 oz) (01/11 [id number]) Weight change: 0.227 kg (8 oz) Last BM Date: 01/28/15 Intake/Output from previous day: 01/10 0701 - 01/11 0700 In: 1261.3 [P.O.:1200; I.V.:61.3] Out: 4590 [Urine:4140; Stool:450] Intake/Output this shift: Total I/O In: 240 [P.O.:240] Out: -   PE: General: NAD Skin:Warm and dry, brisk capillary refill HEENT:normocephalic, sclera clear, mucus membranes moist Heart:irreg irreg  without murmur, gallup, rub or click Lungs: Decreased BS bases. No rales anteriorly [id number], + colostomy draining well,  + BS, do not palpate liver spleen or masses Ext:no lower ext edema, 2+ pedal pulses, 2+ radial pulses Neuro:alert and oriented, MAE, follows commands, + facial symmetry  Tele: atrial fibrillation  HR 90s to 110.    Lab Results:  Recent Labs  01/27/15 0341 01/28/15 0442  WBC 11.5* 17.5*  HGB 13.1 13.5  HCT 42.1 41.4  PLT 453* 443*   BMET  Recent Labs  01/27/15 0341 01/28/15 0442  NA 140 138  K 3.9 3.2*  CL 85* 88*  CO2 47* 42*  GLUCOSE 187* 125*  BUN 14 16  CREATININE 0.85 0.74  CALCIUM 8.2* 8.2*   No results for input(s): TROPONINI in the last 72 hours.  Invalid input(s): CK, MB  Lab Results  Component Value Date   CHOL 149 07/16/2013   HDL 46.20 07/16/2013   LDLCALC 87 07/16/2013   TRIG 156* 01/17/2015   CHOLHDL 3 07/16/2013   Lab Results  Component Value Date   HGBA1C 6.1* 01/15/2015     Lab  Results  Component Value Date   TSH 2.14 07/16/2013         Studies/Results: Dg Chest Port 1 View  01/27/2015  CLINICAL DATA:  Respiratory distress. EXAM: PORTABLE CHEST 1 VIEW COMPARISON:  January 26, 2015. FINDINGS: Stable cardiomegaly and central pulmonary vascular congestion is noted. Bilateral perihilar and basilar interstitial densities are noted concerning for edema. Stable bilateral pleural effusions are noted, with left greater than right. No pneumothorax is noted. IMPRESSION: Stable bilateral opacities most consistent with edema with associated pleural effusions. Electronically Signed   By: Marijo Conception, M.D.   On: 01/27/2015 21:19   Dg Chest Port 1 View  01/26/2015  CLINICAL DATA:  Pulmonary edema and shortness of breath EXAM: PORTABLE CHEST 1 VIEW COMPARISON:  Yesterday FINDINGS: Unchanged hazy appearance of the bilateral chest, left more than right, compatible with effusions. The left effusion is likely moderate volume. There is underlying symmetric interstitial and airspace opacity. Chronic cardiomegaly and stable vascular pedicle widening, accentuated by rightward rotation. No pneumothorax. IMPRESSION: Unchanged CHF pattern including left more than right pleural effusion. Electronically Signed   By: Monte Fantasia M.D.   On: 01/26/2015 11:29    Medications: I have reviewed the patient's current medications. Scheduled Meds: . antiseptic oral rinse  7 mL Mouth Rinse BID  . digoxin  0.125 mg Oral Daily  . diltiazem  90 mg Oral 3 times per day  .  diphenhydrAMINE  50 mg Oral Once  . feeding supplement  1 Container Oral TID BM  . furosemide  40 mg Intravenous BID  . Gerhardt's butt cream   Topical Daily  . iohexol  25 mL Oral Q1 Hr x 2  . methylPREDNISolone (SOLU-MEDROL) injection  125 mg Intravenous Once  . metoprolol tartrate  150 mg Oral BID  . oxyCODONE  20 mg Oral Q12H  . pantoprazole  40 mg Oral Q1200   Continuous Infusions: . sodium chloride 10 mL/hr at 01/23/15  0700   PRN Meds:.ipratropium, levalbuterol, LORazepam, [DISCONTINUED] ondansetron **OR** ondansetron (ZOFRAN) IV, oxyCODONE  Assessment/Plan: 1. Atrial fibrillation/flutter with RVR. Rate difficult to control secondary to stress of surgery and CHF. Rate did fairly well during the day yesterday then increased significantly with respiratory distress last night.  Failed prior attempts at DCCV/rhythm control. Severe LAE and morbid obesity indicate he is a poor candidate for ablation. Will continue focus on rate control. On metoprolol  150 mg bid and digoxin 0.125 mg daily.  Increase oral diltiazem 90 mg tid. On coumadin for anticoagulation- managed by pharmacy.  2. Acute on chronic combined systolic/diastolic CHF. EF 45-50%. Exacerbated by rapid heart rate and fluid shifts from surgery. Excellent diuresis yesterday. I/O negative 3.3 liters. Will continue  IV lasix 40 mg bid today. Alkalosis a little better after diamox yesterday. Repeat CXR in am. I think it will help to get him up in chair today to reduce atelectasis.  3. OSA Encourage BIPAP at night.  4. 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   Active Problems:   Diverticulitis of colon with perforation   Hypoxia   Atrial fibrillation with rapid ventricular response (HCC)   Systolic dysfunction with acute on chronic heart failure (HCC)   Perforated bowel (HCC)   Respiratory failure (HCC)   Dyspnea   Pleural effusion   Pulmonary edema   Pressure ulcer   Tachypnea   William Fischer, [place] 01/28/2015 10:10 AM

## 2015-01-28 NOTE — Progress Notes (Signed)
CRITICAL VALUE ALERT  Critical value received:  Lactic Acid 3.2  Date of notification:  01/28/15  Time of notification:  2115  Critical value read back:Yes.    Nurse who received alert:  Donnella Bi, RN  MD notified (1st page):  Gwenlyn Fudge, MD  Time of first page:  2121  Responding MD:  Gwenlyn Fudge, MD  Time MD responded:  2122

## 2015-01-28 NOTE — Clinical Social Work Note (Signed)
CSW met with sister. Patient has been offered a bed at [facility]. Sister states that the patient and family want the patient to go to CIR at discharge. CSW will continue to follow for any DC needs.   Liz Beach MSW, [place], [place], [phone]

## 2015-01-28 NOTE — Progress Notes (Signed)
Pt off bipap and back on nasal canula.  Will continue to monitor

## 2015-01-28 NOTE — Progress Notes (Signed)
MD at bedside. Pt c/o dizziness. Q30 minute VS. New orders received. Night MD to follow up. Will continue to monitor.   Ruben Reason, RN

## 2015-01-29 ENCOUNTER — Inpatient Hospital Stay (HOSPITAL_COMMUNITY): Payer: Medicaid Other

## 2015-01-29 ENCOUNTER — Encounter (HOSPITAL_COMMUNITY): Payer: Self-pay | Admitting: Radiology

## 2015-01-29 DIAGNOSIS — R0600 Dyspnea, unspecified: Secondary | ICD-10-CM

## 2015-01-29 DIAGNOSIS — T8131XA Disruption of external operation (surgical) wound, not elsewhere classified, initial encounter: Secondary | ICD-10-CM

## 2015-01-29 DIAGNOSIS — B9689 Other specified bacterial agents as the cause of diseases classified elsewhere: Secondary | ICD-10-CM

## 2015-01-29 DIAGNOSIS — K651 Peritoneal abscess: Secondary | ICD-10-CM

## 2015-01-29 DIAGNOSIS — K9402 Colostomy infection: Secondary | ICD-10-CM

## 2015-01-29 LAB — BASIC METABOLIC PANEL
Anion gap: 10 (ref 5–15)
BUN: 23 mg/dL — ABNORMAL HIGH (ref 6–20)
CALCIUM: 8.5 mg/dL — AB (ref 8.9–10.3)
CO2: 43 mmol/L — AB (ref 22–32)
CREATININE: 0.97 mg/dL (ref 0.61–1.24)
Chloride: 87 mmol/L — ABNORMAL LOW (ref 101–111)
GFR calc non Af Amer: >60 mL/min (ref >60)
Glucose, Bld: 171 mg/dL — ABNORMAL HIGH (ref 65–99)
Potassium: 3.7 mmol/L (ref 3.5–5.1)
SODIUM: 140 mmol/L (ref 135–145)

## 2015-01-29 LAB — GLUCOSE, CAPILLARY
GLUCOSE-CAPILLARY: 197 mg/dL — AB (ref 65–99)
Glucose-Capillary: 159 mg/dL — ABNORMAL HIGH (ref 65–99)
Glucose-Capillary: 132 mg/dL — ABNORMAL HIGH (ref 65–99)
Glucose-Capillary: 173 mg/dL — ABNORMAL HIGH (ref 65–99)

## 2015-01-29 LAB — PROTIME-INR
INR: 1.34 (ref 0.00–1.49)
PROTHROMBIN TIME: 16.7 s — AB (ref 11.6–15.2)

## 2015-01-29 LAB — CBC
HCT: 38.7 % — ABNORMAL LOW (ref 39.0–52.0)
Hemoglobin: 12.6 g/dL — ABNORMAL LOW (ref 13.0–17.0)
MCH: 31.7 pg (ref 26.0–34.0)
MCHC: 32.6 g/dL (ref 30.0–36.0)
MCV: 97.5 fL (ref 78.0–100.0)
PLATELETS: 383 10*3/uL (ref 150–400)
RBC: 3.97 MIL/uL — AB (ref 4.22–5.81)
RDW: 15.3 % (ref 11.5–15.5)
WBC: 18.3 10*3/uL — AB (ref 4.0–10.5)

## 2015-01-29 MED ORDER — HEPARIN (PORCINE) IN NACL 100-0.45 UNIT/ML-% IJ SOLN
3200.0000 [IU]/h | INTRAMUSCULAR | Status: DC
  Administered 2015-01-29: 1700 [IU]/h via INTRAVENOUS
  Administered 2015-01-30: 2500 [IU]/h via INTRAVENOUS
  Administered 2015-01-30: 3000 [IU]/h via INTRAVENOUS
  Administered 2015-01-31 – 2015-02-01 (×3): 3500 [IU]/h via INTRAVENOUS
  Administered 2015-02-01 – 2015-02-02 (×2): 3200 [IU]/h via INTRAVENOUS
  Filled 2015-01-29 (×10): qty 250

## 2015-01-29 MED ORDER — FUROSEMIDE 40 MG PO TABS
40.0000 mg | ORAL_TABLET | Freq: Every day | ORAL | Status: DC
  Administered 2015-01-29 – 2015-02-02 (×5): 40 mg via ORAL
  Filled 2015-01-29 (×5): qty 1

## 2015-01-29 MED ORDER — FENTANYL CITRATE (PF) 100 MCG/2ML IJ SOLN
INTRAMUSCULAR | Status: AC | PRN
  Administered 2015-01-29 (×2): 50 ug via INTRAVENOUS

## 2015-01-29 MED ORDER — FENTANYL CITRATE (PF) 100 MCG/2ML IJ SOLN
INTRAMUSCULAR | Status: AC
  Filled 2015-01-29: qty 4

## 2015-01-29 MED ORDER — MIDAZOLAM HCL 2 MG/2ML IJ SOLN
INTRAMUSCULAR | Status: AC | PRN
  Administered 2015-01-29: 0.5 mg via INTRAVENOUS
  Administered 2015-01-29: 1 mg via INTRAVENOUS

## 2015-01-29 MED ORDER — MIDAZOLAM HCL 2 MG/2ML IJ SOLN
INTRAMUSCULAR | Status: AC
Start: 2015-01-29 — End: 2015-01-30
  Filled 2015-01-29: qty 6

## 2015-01-29 MED ORDER — LIDOCAINE HCL 1 % IJ SOLN
INTRAMUSCULAR | Status: AC
Start: 2015-01-29 — End: 2015-01-30
  Filled 2015-01-29: qty 20

## 2015-01-29 NOTE — Progress Notes (Signed)
Chief Complaint: Patient was seen in consultation today for abdominal abscess at the request of Dr. Rosendo Gros  Referring Physician(s): Dr. Rosendo Gros  History of Present Illness: William Fischer is a 50 y.o. male who underwent ex lap with left colectomy and end colostomy almost 2 weeks ago for ischemic colitis. He has had a prolonged hospital stay and has persistent elevated WBC. Repeat abd CT was performed finding significant left sided abdominal abscess containing gas and fluid, this was concerning for dehiscence of the colonic stump/pouch and was communicated to the surgical team. IR is now asked to place drain(s) into abscess to try and decompress/evacuate. Chart, PMHx, meds, labs, imaging, allergies reviewed. Pt on Coumadin for afib and cardiomyopathy, followed by Cardiology, in transition to Pradaxa, so it has been held, INR subtherapeutic this am. Pt feels ok, some abd distention and pain.  Past Medical History  Diagnosis Date  . Chicken pox as a child  . Migraine 06/29/2011  . Obesity 06/29/2011  . Depression with anxiety 06/29/2011  . Atrial fibrillation ([place])     admx 11/13 with acute sCHF in setting of RVR  => a. failed DCCV x 2; b. Pradaxa started;  c. failed sotalol  . Chronic systolic heart failure ([place])     a. echo 11/13: Ef 40-45%, diff HK, mod MR, mod LAE, mild RVE, mod RAE, small effusion;   b. TEE 11/13:  EF 35-40%, no LAA clot; Echo 2/14 shows normal EF  . Cardiomyopathy ([place])     likely tachy mediated in setting of AF with RVR  . Snoring     patient needs sleep study - has declined  . Allergy to IVP dye   . Chronic anticoagulation     Pradaxa    Past Surgical History  Procedure Laterality Date  . Tee without cardioversion  11/30/2011    Procedure: TRANSESOPHAGEAL ECHOCARDIOGRAM (TEE);  Surgeon: Thayer Headings, MD;  Location: [facility];  Service: Cardiovascular;  Laterality: N/A;  . Cardioversion  11/30/2011    Procedure: CARDIOVERSION;  Surgeon: Thayer Headings, MD;  Location: [facility];  Service: Cardiovascular;  Laterality: N/A;  . Cardioversion  12/03/2011    Procedure: CARDIOVERSION;  Surgeon: Jolaine Artist, MD;  Location: [facility];  Service: Cardiovascular;  Laterality: N/A;  . Laparotomy N/A 01/17/2015    Procedure: EXPLORATORY LAPAROTOMY WITH LEFT COLECTOMY AND COLOSTOMY;  Surgeon: Rolm Bookbinder, MD;  Location: [facility];  Service: General;  Laterality: N/A;    Allergies: Contrast media  Medications:  Current facility-administered medications:  .  antiseptic oral rinse (CPC / CETYLPYRIDINIUM CHLORIDE 0.05%) solution 7 mL, 7 mL, Mouth Rinse, BID, Javier Glazier, MD, 7 mL at 01/29/15 1016 .  digoxin (LANOXIN) tablet 0.125 mg, 0.125 mg, Oral, Daily, Peter M Martinique, MD, 0.125 mg at 01/29/15 1015 .  diltiazem (CARDIZEM) tablet 90 mg, 90 mg, Oral, 3 times per day, Peter M Martinique, MD, 90 mg at 01/29/15 0515 .  feeding supplement (BOOST / RESOURCE BREEZE) liquid 1 Container, 1 Container, Oral, TID BM, Emina Riebock, NP, 1 Container at 01/28/15 1400 .  furosemide (LASIX) tablet 40 mg, 40 mg, Oral, Daily, Eileen Stanford, PA-C, 40 mg at 01/29/15 1019 .  Gerhardt's butt cream, , Topical, Daily, Norman Herrlich, MD .  insulin aspart (novoLOG) injection 0-9 Units, 0-9 Units, Subcutaneous, TID WC, Bethena Roys, MD, 2 Units at 01/29/15 0807 .  ipratropium (ATROVENT) nebulizer solution 0.5 mg, 0.5 mg, Nebulization, Q2H PRN, Chesley Mires, MD .  levalbuterol (XOPENEX) nebulizer solution 0.63 mg, 0.63 mg, Nebulization, Q2H PRN, Chesley Mires, MD, 0.63 mg at 01/25/15 0448 .  LORazepam (ATIVAN) injection 0.5 mg, 0.5 mg, Intravenous, Q6H PRN, Liberty Handy, MD .  metoprolol (LOPRESSOR) tablet 150 mg, 150 mg, Oral, BID, Peter M Martinique, MD, 150 mg at 01/29/15 1015 .  [DISCONTINUED] ondansetron (ZOFRAN) tablet 4 mg, 4 mg, Oral, Q6H PRN **OR** ondansetron (ZOFRAN) injection 4 mg, 4 mg, Intravenous, Q6H PRN, Milagros Loll, MD, 4 mg at 01/15/15  1307 .  oxyCODONE (Oxy IR/ROXICODONE) immediate release tablet 5 mg, 5 mg, Oral, Q4H PRN, Norman Herrlich, MD, 5 mg at 01/29/15 0515 .  oxyCODONE (OXYCONTIN) 12 hr tablet 20 mg, 20 mg, Oral, Q12H, Norman Herrlich, MD, 20 mg at 01/29/15 1015 .  pantoprazole (PROTONIX) EC tablet 40 mg, 40 mg, Oral, Q1200, Chesley Mires, MD, 40 mg at 01/29/15 1019 .  piperacillin-tazobactam (ZOSYN) IVPB 3.375 g, 3.375 g, Intravenous, 3 times per day, Saverio Danker, PA-C, 3.375 g at 01/29/15 0457    Family History  Problem Relation Age of Onset  . Hypertension Mother   . Hypertension Father   . Aneurysm Father   . Alcohol abuse Father   . Cancer Maternal Grandmother     brain  . Diabetes Maternal Grandfather     type 2  . Parkinsonism Maternal Grandfather   . Cancer Paternal Grandmother     breast  . Diabetes Paternal Grandmother   . Alcohol abuse Paternal Grandfather     Social History   Social History  . Marital Status: Divorced    Spouse Name: N/A  . Number of Children: N/A  . Years of Education: N/A   Social History Main Topics  . Smoking status: Current Every Day Smoker -- 1.00 packs/day for 20 years    Types: Cigarettes  . Smokeless tobacco: Never Used  . Alcohol Use: No  . Drug Use: No  . Sexual Activity: No   Other Topics Concern  . None   Social History Narrative     Review of Systems: A 12 point ROS discussed and pertinent positives are indicated in the HPI above.  All other systems are negative.  Review of Systems  Vital Signs: BP 117/68 mmHg  Pulse 99  Temp(Src) 98 F (36.7 C) (Oral)  Resp 25  Ht 5\' 11"  (1.803 m)  Wt 283 lb 12.8 oz (128.731 kg)  BMI 39.60 kg/m2  SpO2 92%  Physical Exam  Constitutional: He is oriented to person, place, and time. He appears well-developed and well-nourished. No distress.  HENT:  Head: Normocephalic.  Mouth/Throat: Oropharynx is clear and moist.  Cardiovascular: Normal rate, regular rhythm and normal heart sounds.     Pulmonary/Chest: Effort normal and breath sounds normal. No respiratory distress.  Abdominal: Soft. He exhibits distension. He exhibits no mass. There is tenderness.  Ostomy present (R)abdomen  Neurological: He is alert and oriented to person, place, and time.  Psychiatric: He has a normal mood and affect. Judgment normal.    Mallampati Score:  MD Evaluation Airway: WNL Heart: WNL Abdomen: WNL Chest/ Lungs: WNL ASA  Classification: 3 Mallampati/Airway Score: Two  Imaging: Ct Abdomen Pelvis Wo Contrast  01/17/2015  CLINICAL DATA:  Perforated bowel.  Followup. EXAM: CT ABDOMEN AND PELVIS WITHOUT CONTRAST TECHNIQUE: Multidetector CT imaging of the abdomen and pelvis was performed following the standard protocol without IV contrast. COMPARISON:  01/14/2015 FINDINGS: Lower chest: Increased left pleural effusion and left basilar consolidation. Increased right lower lobe  atelectasis. Heart is mildly enlarged. Upper abdomen: Significant free intraperitoneal air is now noted. Gas now tracts within the mesentery and extends into the upper abdomen within the retroperitoneum. Small low-attenuation lesion within the posterior aspect of the liver, stable in appearance a not completely characterized measuring 1.0 cm. No focal abnormality within the spleen, pancreas, or kidneys. The adrenal glands are prominent bilaterally without focal mass. Gallbladder is present. Gastrointestinal tract: The colon has a thickened wall beginning at the proximal descending colon, extending through the upper sigmoid colon. There are scattered diverticula. Significant gas is identified surrounding the descending and sigmoid colon, increased since prior study. The adjacent small bowel loops in the left lower quadrant have significant thickening of the wall. Pelvis: There is a new fluid collection in the pelvis measuring 5.5 x 6.4 cm. This is a amorphous and I doubt this has become an abscess. There is fluid within the sigmoid  mesentery but this does not appear to be consolidated at this time. Retroperitoneum: No evidence for aortic aneurysm. No retroperitoneal or mesenteric adenopathy. Abdominal wall: Unremarkable. Osseous structures: Unremarkable. IMPRESSION: 1. Interval development of significant amount of free intraperitoneal air. 2. Gas and fluid within the sigmoid mesenteric have increased. 3. Fluid collection in the pelvis 6.4 cm without evidence for abscess. 4. Increased left pleural effusion, left basilar consolidation, and right lower lobe atelectasis. 5. Prominent adrenal glands bilaterally, stable in appearance. 6. Critical Value/emergent results were discussed in person at the time of interpretation on 01/17/2015 at 1:30 pm to Dr. Liberty Handy. Electronically Signed   By: Nolon Nations M.D.   On: 01/17/2015 13:54   Ct Chest W Contrast  01/28/2015  CLINICAL DATA:  Abscess.  Perforated diverticulitis. EXAM: CT CHEST, ABDOMEN, AND PELVIS WITH CONTRAST TECHNIQUE: Multidetector CT imaging of the chest, abdomen and pelvis was performed following the standard protocol during bolus administration of intravenous contrast. CONTRAST:  181mL OMNIPAQUE IOHEXOL 300 MG/ML  SOLN COMPARISON:  01/17/2015 FINDINGS: CT CHEST Mediastinum: The heart size is mildly enlarged. No pericardial effusion identified. The trachea appears patent and is midline. Unremarkable appearance of the esophagus. Multiple small lymph nodes are noted within the mediastinum. Likely reactive. No adenopathy identified. No supraclavicular or axillary adenopathy. Lungs/Pleura: There is a moderate volume left pleural effusion. A small loculated right pleural effusion is identified, image 85 of series 5. Atelectasis is noted within the right middle lobe and right lower lobe. There is airspace consolidation involving the left lower lobe. Musculoskeletal: No aggressive lytic or sclerotic bone lesions are identified. CT ABDOMEN AND PELVIS Hepatobiliary: There is a low  density structure within the posterior right lobe of liver measuring 11 mm, image 52 of series 2. This is unchanged from previous exam. The gallbladder is normal. No biliary dilatation. Pancreas: Negative Spleen: Negative Adrenals/Urinary Tract: The normal appearance of the adrenal glands. The kidneys are unremarkable. Urinary bladder is negative. Stomach/Bowel: Stomach has a normal caliber. There is no abnormal pathologic dilatation of the small bowel loops. The appendix is visualized and appears normal. The proximal scratch set there is a right sided colostomy. The proximal large bowel loops have a normal caliber. There is been dehiscence of the Hartman's pouch along the suture line. There is an associated large complex fluid collection involving the left side of abdomen measuring 13.5 x 8.3 by 30.9 cm. This extends into the upper abdomen an abuts the greater curvature of the stomach. There are multiple smaller fluid collections scratch set there are smaller collections of gas and fluid  identified elsewhere within the abdomen particularly along the left pericolic gutter and retroperitoneum and extending over the spleen. No significant fluid collections identified within the right side of abdomen. Vascular/Lymphatic: Normal appearance of the abdominal aorta. No enlarged retroperitoneal or mesenteric adenopathy. No enlarged pelvic or inguinal lymph nodes. Reproductive: The prostate gland and seminal vesicles appear normal. Other: Multiple fluid collections secondary to dehiscence of Hartman's pouch identified. Musculoskeletal: No aggressive lytic or sclerotic bone lesions. IMPRESSION: 1. Examination is significant for dehiscence of the Hartman's pouch along the suture line. There is a resultant large complex fluid collection involving much of the left hemi abdomen. Multiple smaller gas and fluid collections are identified scattered throughout the left abdomen especially within the left pericolic gutter and  retroperitoneal space posterior to the left kidney. 2. Partially loculated right pleural effusion. 3. Moderate left effusion with overlying airspace consolidation. Critical Value/emergent results were called by telephone at the time of interpretation on 01/28/2015 at 1:41 pm to Dr. Hulen Luster, who verbally acknowledged these results. Electronically Signed   By: Kerby Moors M.D.   On: 01/28/2015 13:41   Ct Abdomen Pelvis W Contrast  01/28/2015  CLINICAL DATA:  Abscess.  Perforated diverticulitis. EXAM: CT CHEST, ABDOMEN, AND PELVIS WITH CONTRAST TECHNIQUE: Multidetector CT imaging of the chest, abdomen and pelvis was performed following the standard protocol during bolus administration of intravenous contrast. CONTRAST:  164mL OMNIPAQUE IOHEXOL 300 MG/ML  SOLN COMPARISON:  01/17/2015 FINDINGS: CT CHEST Mediastinum: The heart size is mildly enlarged. No pericardial effusion identified. The trachea appears patent and is midline. Unremarkable appearance of the esophagus. Multiple small lymph nodes are noted within the mediastinum. Likely reactive. No adenopathy identified. No supraclavicular or axillary adenopathy. Lungs/Pleura: There is a moderate volume left pleural effusion. A small loculated right pleural effusion is identified, image 85 of series 5. Atelectasis is noted within the right middle lobe and right lower lobe. There is airspace consolidation involving the left lower lobe. Musculoskeletal: No aggressive lytic or sclerotic bone lesions are identified. CT ABDOMEN AND PELVIS Hepatobiliary: There is a low density structure within the posterior right lobe of liver measuring 11 mm, image 52 of series 2. This is unchanged from previous exam. The gallbladder is normal. No biliary dilatation. Pancreas: Negative Spleen: Negative Adrenals/Urinary Tract: The normal appearance of the adrenal glands. The kidneys are unremarkable. Urinary bladder is negative. Stomach/Bowel: Stomach has a normal caliber. There is no  abnormal pathologic dilatation of the small bowel loops. The appendix is visualized and appears normal. The proximal scratch set there is a right sided colostomy. The proximal large bowel loops have a normal caliber. There is been dehiscence of the Hartman's pouch along the suture line. There is an associated large complex fluid collection involving the left side of abdomen measuring 13.5 x 8.3 by 30.9 cm. This extends into the upper abdomen an abuts the greater curvature of the stomach. There are multiple smaller fluid collections scratch set there are smaller collections of gas and fluid identified elsewhere within the abdomen particularly along the left pericolic gutter and retroperitoneum and extending over the spleen. No significant fluid collections identified within the right side of abdomen. Vascular/Lymphatic: Normal appearance of the abdominal aorta. No enlarged retroperitoneal or mesenteric adenopathy. No enlarged pelvic or inguinal lymph nodes. Reproductive: The prostate gland and seminal vesicles appear normal. Other: Multiple fluid collections secondary to dehiscence of Hartman's pouch identified. Musculoskeletal: No aggressive lytic or sclerotic bone lesions. IMPRESSION: 1. Examination is significant for dehiscence of the Hartman's pouch  along the suture line. There is a resultant large complex fluid collection involving much of the left hemi abdomen. Multiple smaller gas and fluid collections are identified scattered throughout the left abdomen especially within the left pericolic gutter and retroperitoneal space posterior to the left kidney. 2. Partially loculated right pleural effusion. 3. Moderate left effusion with overlying airspace consolidation. Critical Value/emergent results were called by telephone at the time of interpretation on 01/28/2015 at 1:41 pm to Dr. Hulen Luster, who verbally acknowledged these results. Electronically Signed   By: Kerby Moors M.D.   On: 01/28/2015 13:41   Dg Chest  Port 1 View  01/27/2015  CLINICAL DATA:  Respiratory distress. EXAM: PORTABLE CHEST 1 VIEW COMPARISON:  January 26, 2015. FINDINGS: Stable cardiomegaly and central pulmonary vascular congestion is noted. Bilateral perihilar and basilar interstitial densities are noted concerning for edema. Stable bilateral pleural effusions are noted, with left greater than right. No pneumothorax is noted. IMPRESSION: Stable bilateral opacities most consistent with edema with associated pleural effusions. Electronically Signed   By: Marijo Conception, M.D.   On: 01/27/2015 21:19   Dg Chest Port 1 View  01/26/2015  CLINICAL DATA:  Pulmonary edema and shortness of breath EXAM: PORTABLE CHEST 1 VIEW COMPARISON:  Yesterday FINDINGS: Unchanged hazy appearance of the bilateral chest, left more than right, compatible with effusions. The left effusion is likely moderate volume. There is underlying symmetric interstitial and airspace opacity. Chronic cardiomegaly and stable vascular pedicle widening, accentuated by rightward rotation. No pneumothorax. IMPRESSION: Unchanged CHF pattern including left more than right pleural effusion. Electronically Signed   By: Monte Fantasia M.D.   On: 01/26/2015 11:29   Dg Chest Port 1 View  01/25/2015  CLINICAL DATA:  Acute onset of shortness of breath. EXAM: PORTABLE CHEST 1 VIEW COMPARISON:  Chest radiograph performed 01/23/2015 FINDINGS: Bilateral central airspace opacification is noted, with underlying vascular congestion and small to moderate bilateral pleural effusions, compatible with pulmonary edema. This appears worsened from the prior study. No pneumothorax is seen. The cardiomediastinal silhouette is enlarged. No acute osseous abnormalities are identified. IMPRESSION: Worsening pulmonary edema, with small to moderate bilateral pleural effusions. Underlying vascular congestion and cardiomegaly. Electronically Signed   By: Garald Balding M.D.   On: 01/25/2015 06:01   Dg Chest Port 1  View  01/23/2015  CLINICAL DATA:  Status post exploratory laparotomy and subsequent colectomy and colostomy placement for perforated bowel secondary to diverticulitis; hypoxia, atrial fibrillation, acute and chronic CHF. EXAM: PORTABLE CHEST 1 VIEW COMPARISON:  Portable chest x-ray of January 21, 2015 FINDINGS: The lungs are mildly hypoinflated. The interstitial markings on the left have improved. The hemidiaphragms remain it obscured. The cardiac silhouette remains enlarged. The pulmonary vascularity is slightly less engorged centrally. The observed bony thorax is unremarkable. IMPRESSION: Interval improvement in the pulmonary interstitium consistent with decreasing interstitial edema. There is persistent cardiomegaly, pulmonary interstitial edema, and small bilateral pleural effusions. Electronically Signed   By: David  Martinique M.D.   On: 01/23/2015 07:41   Dg Chest Port 1 View  01/22/2015  CLINICAL DATA:  Dyspnea. EXAM: PORTABLE CHEST 1 VIEW COMPARISON:  01/19/2015 FINDINGS: Enteric tube is been removed. Tip of the right central line in the region of the proximal SVC. Low lung volumes again seen. Progressive bibasilar opacities, likely combination of pleural effusion and consolidation. Heart appears prominent in size. Vascular congestion suspected, not well assessed due to low lung volumes. IMPRESSION: Hypoventilatory chest with progressive bibasilar opacities, likely combination of pleural effusion and consolidation.  Consolidation may be related to atelectasis, aspiration, or pneumonia. Electronically Signed   By: Jeb Levering M.D.   On: 01/22/2015 00:02   Dg Chest Port 1 View  01/19/2015  CLINICAL DATA:  50 year old male with respiratory distress shortness of breath. EXAM: PORTABLE CHEST 1 VIEW COMPARISON:  Chest x-ray 01/17/2015. FINDINGS: Patient has been extubated. A nasogastric tube is seen extending into the stomach, however, the tip of the nasogastric tube extends below the lower margin of the  image. There is a right-sided internal jugular central venous catheter with tip terminating in the proximal superior vena cava. Lung volumes are slightly low. Widespread patchy asymmetrically distributed interstitial and airspace opacities throughout the lungs bilaterally, with relative sparing of the right upper lobe. Overall aeration is very similar to the prior study. Small right and moderate left pleural effusions. Pulmonary vasculature does not appear engorged. Heart size is mildly enlarged. Upper mediastinal contours are within normal limits. IMPRESSION: 1. Support apparatus, as above. 2. Patchy asymmetrically distributed interstitial and airspace opacities in the lungs bilaterally favored to reflect a multilobar pneumonia. 3. Mild cardiomegaly. Electronically Signed   By: Vinnie Langton M.D.   On: 01/19/2015 07:26   Dg Chest Port 1 View  01/17/2015  CLINICAL DATA:  Central line placement.  Initial encounter. EXAM: PORTABLE CHEST 1 VIEW COMPARISON:  Chest radiograph performed earlier today at 10:49 a.m. FINDINGS: The patient's endotracheal tube is seen ending 6 cm above the carina. The enteric tube is noted extending below the diaphragm. A right IJ line is noted ending about the proximal SVC. The lungs are well-aerated. Small bilateral pleural effusions noted. Vascular congestion is seen, with bilateral airspace opacification, left greater than right. This raises concern for mildly worsening asymmetric pulmonary edema. There is no evidence of pneumothorax. The cardiomediastinal silhouette is enlarged. No acute osseous abnormalities are seen. IMPRESSION: 1. Endotracheal tube seen ending 6 cm above the carina. 2. Right IJ line noted ending about the proximal SVC. 3. Small bilateral pleural effusions noted. Vascular congestion and cardiomegaly, with bilateral airspace opacification, left greater than right. This raises concern for mildly worsening asymmetric pulmonary edema. Electronically Signed   By:  Garald Balding M.D.   On: 01/17/2015 21:41   Dg Chest Port 1 View  01/17/2015  CLINICAL DATA:  Dyspnea, atrial fibrillation, chronic heart failure and cardiomyopathy. EXAM: PORTABLE CHEST 1 VIEW COMPARISON:  01/15/2015 FINDINGS: Degree of pulmonary edema is mildly improved with moderate interstitial edema remaining. Component of left pleural fluid appears less prominent. Heart size also appears less prominent on the portable x-ray. IMPRESSION: Mildly improving pulmonary edema and diminished prominence of left pleural fluid. The heart size also appears less prominent by x-ray. Electronically Signed   By: Aletta Edouard M.D.   On: 01/17/2015 11:20   Dg Chest Port 1 View  01/15/2015  CLINICAL DATA:  Hypoxia. EXAM: PORTABLE CHEST 1 VIEW COMPARISON:  November 30, 2011. FINDINGS: Stable cardiomegaly is noted. Central pulmonary vascular congestion is noted with mild bilateral perihilar and basilar interstitial densities concerning for possible edema. No pneumothorax is noted. Probable mild left pleural effusion is noted. Bony thorax is unremarkable. IMPRESSION: Cardiomegaly and central pulmonary vascular congestion is noted with probable mild bilateral perihilar and basilar pulmonary edema. Probable mild left pleural effusion is noted. Electronically Signed   By: Marijo Conception, M.D.   On: 01/15/2015 10:16   Ct Renal Stone Study  01/14/2015  CLINICAL DATA:  Bilateral groin pain for 5 days.  Hematuria. EXAM: CT ABDOMEN AND  PELVIS WITHOUT CONTRAST TECHNIQUE: Multidetector CT imaging of the abdomen and pelvis was performed following the standard protocol without IV contrast. COMPARISON:  None. FINDINGS: Small left pleural effusion. Left lower lobe atelectasis or infiltrate. Cannot exclude pneumonia. Right lung bases clear. Heart is upper limits normal in size. Liver, spleen, pancreas, adrenals and kidneys have an unremarkable unenhanced appearance. Gallbladder unremarkable. There is extensive pelvic and left  retroperitoneal gas. Inflammatory changes in the left retroperitoneum and surrounding the descending colon and sigmoid colon. The gas appears to communicate with a diverticulum in the proximal sigmoid colon there is an area of wall thickening. Findings most compatible with perforated diverticulitis. Trace free fluid in the cul-de-sac. Stomach and small bowel and appendix are normal. Small umbilical hernia containing fat. Small bilateral inguinal hernias containing fat. No acute bony abnormality. IMPRESSION: Extensive gas throughout the left retroperitoneum with extensive stranding around the descending colon and sigmoid colon. This appears to be related to perforated diverticulitis in the proximal sigmoid colon. Small left pleural effusion. Left lower lobe atelectasis or infiltrate. Cannot exclude pneumonia. Electronically Signed   By: Rolm Baptise M.D.   On: 01/14/2015 12:10    Labs:  CBC:  Recent Labs  01/26/15 0432 01/27/15 0341 01/28/15 0442 01/29/15 0420  WBC 9.7 11.5* 17.5* 18.3*  HGB 12.6* 13.1 13.5 12.6*  HCT 39.8 42.1 41.4 38.7*  PLT 420* 453* 443* 383    COAGS:  Recent Labs  01/26/15 0432 01/27/15 0341 01/28/15 0442 01/29/15 0420  INR 5.93* 3.66* 3.47* 1.34    BMP:  Recent Labs  01/26/15 0432 01/27/15 0341 01/28/15 0442 01/29/15 0420  NA 139 140 138 140  K 4.3 3.9 3.2* 3.7  CL 88* 85* 88* 87*  CO2 44* 47* 42* 43*  GLUCOSE 145* 187* 125* 171*  BUN 11 14 16  23*  CALCIUM 8.2* 8.2* 8.2* 8.5*  CREATININE 0.71 0.85 0.74 0.97  GFRNONAA >60 >60 >60 >60  GFRAA >60 >60 >60 >60    LIVER FUNCTION TESTS:  Recent Labs  01/14/15 0906 01/19/15 0445  BILITOT 1.2 1.1  AST 22 47*  ALT 21 26  ALKPHOS 85 67  PROT 6.7 4.9*  ALBUMIN 2.6* 1.4*    Assessment and Plan: 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 Plan for CT guided drainage of largest collection with at least one, perhaps two perc  drains. Labs reviewed. Risks and Benefits discussed with the patient including bleeding, infection, damage to adjacent structures, bowel perforation/fistula connection, and sepsis. All of the patient's questions were answered, patient is agreeable to proceed. Consent signed and in chart.    Thank you for this interesting consult.  A copy of this report was sent to the requesting provider on this date.  Electronically Signed: Ascencion Dike 01/29/2015, 10:30 AM   I spent a total of 20 minutes in face to face in clinical consultation, greater than 50% of which was counseling/coordinating care for abdominal abscess drainage

## 2015-01-29 NOTE — Sedation Documentation (Addendum)
approx 1,000 cc foul odor dark brown drainage into drainage bag

## 2015-01-29 NOTE — Progress Notes (Signed)
Surgical team rounded and were updated on his condition, VS and asked about a diet for him since he was in as a cardiac healthy diet, will give small amounts of clear liqs. And continue to monitor.

## 2015-01-29 NOTE — Progress Notes (Signed)
ANTICOAGULATION CONSULT NOTE - Initial Consult  Pharmacy Consult for Heparin Indication: atrial fibrillation  Allergies  Allergen Reactions  . Contrast Media [Iodinated Diagnostic Agents] Rash    a diffuse macular rash after CTA chest  Pt was premedicated with 125mg  IV Solumedrol, 50mg  IV Benadryl 1 hr prior to CTexam, and tolerated procedure without any difficulties.    Patient Measurements: Height: 5\' 11"  (180.3 cm) Weight: 283 lb 12.8 oz (128.731 kg) IBW/kg (Calculated) : 75.3 Heparin Dosing Weight: 104.5 kg  Vital Signs: Temp: 98.6 F (37 C) (01/12 1430) Temp Source: Axillary (01/12 1430) BP: 103/64 mmHg (01/12 1600) Pulse Rate: 103 (01/12 1600)  Labs:  Recent Labs  01/27/15 0341 01/28/15 0442 01/29/15 0420  HGB 13.1 13.5 12.6*  HCT 42.1 41.4 38.7*  PLT 453* 443* 383  LABPROT 35.5* 34.1* 16.7*  INR 3.66* 3.47* 1.34  CREATININE 0.85 0.74 0.97    Estimated Creatinine Clearance: 128.8 mL/min (by C-G formula based on Cr of 0.97).   Medical History: Past Medical History  Diagnosis Date  . Chicken pox as a child  . Migraine 06/29/2011  . Obesity 06/29/2011  . Depression with anxiety 06/29/2011  . Atrial fibrillation ([place])     admx 11/13 with acute sCHF in setting of RVR  => a. failed DCCV x 2; b. Pradaxa started;  c. failed sotalol  . Chronic systolic heart failure ([place])     a. echo 11/13: Ef 40-45%, diff HK, mod MR, mod LAE, mild RVE, mod RAE, small effusion;   b. TEE 11/13:  EF 35-40%, no LAA clot; Echo 2/14 shows normal EF  . Cardiomyopathy ([place])     likely tachy mediated in setting of AF with RVR  . Snoring     patient needs sleep study - has declined  . Allergy to IVP dye   . Chronic anticoagulation     Pradaxa   Assessment: 50 year old obese male with CHF and atrial fibrillation with RVR to start IV heparin.   Patient was on chronic anticoagulation pta with Pradaxa. Admitted with perforated bowel s/p L-colectomy and colostomy and now with  intra-abdominal abscess with drains placed by IR today and no plans for immediate surgery at this time. Note warfarin was given 1/4 to 1/8 overlapped with IV heparin- requiring upwards of 3300 units/hr. This has been discontinued. INR reversed with Vit K on 1/11.   INR today is 1.34. CBC stable. No bleeding reported.  CHADSVASC = 1 (CHF)  Goal of Therapy:  Heparin level 0.3-0.7 units/ml Monitor platelets by anticoagulation protocol: Yes   Plan:  Restart IV heparin at 1700 units/hr (no bolus with recent surgery).  Heparin level in 6 hours to titrate up.  Daily heparin level and CBC.  Monitor for signs and symptoms of bleeding.   Sloan Leiter, PharmD, BCPS Clinical Pharmacist [phone] 01/29/2015,8:11 PM

## 2015-01-29 NOTE — Progress Notes (Signed)
12 Days Post-Op  Subjective: Pt doing well overnight.  Objective: Vital signs in last 24 hours: Temp:  [98 F (36.7 C)-99.3 F (37.4 C)] 98 F (36.7 C) (01/12 0515) Pulse Rate:  [72-126] 101 (01/12 0800) Resp:  [17-40] 19 (01/12 0800) BP: (98-136)/(53-89) 109/65 mmHg (01/12 0800) SpO2:  [90 %-95 %] 90 % (01/12 0800) Weight:  [128.731 kg (283 lb 12.8 oz)] 128.731 kg (283 lb 12.8 oz) (01/12 0515) Last BM Date: 01/28/15  Intake/Output from previous day: 01/11 0701 - 01/12 0700 In: 1485 [P.O.:1435; IV Piggyback:50] Out: 1550 [Urine:950; Drains:450; Stool:150] Intake/Output this shift:    General appearance: alert and cooperative GI: soft, non-tender; bowel sounds normal; no masses,  no organomegaly and vac in place, colostomy patent  Lab Results:   Recent Labs  01/28/15 0442 01/29/15 0420  WBC 17.5* 18.3*  HGB 13.5 12.6*  HCT 41.4 38.7*  PLT 443* 383   BMET  Recent Labs  01/28/15 0442 01/29/15 0420  NA 138 140  K 3.2* 3.7  CL 88* 87*  CO2 42* 43*  GLUCOSE 125* 171*  BUN 16 23*  CREATININE 0.74 0.97  CALCIUM 8.2* 8.5*   PT/INR  Recent Labs  01/28/15 0442 01/29/15 0420  LABPROT 34.1* 16.7*  INR 3.47* 1.34   ABG  Recent Labs  01/27/15 0452 01/27/15 1340  PHART 7.521* 7.446  HCO3 48.9* 45.7*    Studies/Results: Ct Chest W Contrast  01/28/2015  CLINICAL DATA:  Abscess.  Perforated diverticulitis. EXAM: CT CHEST, ABDOMEN, AND PELVIS WITH CONTRAST TECHNIQUE: Multidetector CT imaging of the chest, abdomen and pelvis was performed following the standard protocol during bolus administration of intravenous contrast. CONTRAST:  183mL OMNIPAQUE IOHEXOL 300 MG/ML  SOLN COMPARISON:  01/17/2015 FINDINGS: CT CHEST Mediastinum: The heart size is mildly enlarged. No pericardial effusion identified. The trachea appears patent and is midline. Unremarkable appearance of the esophagus. Multiple small lymph nodes are noted within the mediastinum. Likely reactive. No  adenopathy identified. No supraclavicular or axillary adenopathy. Lungs/Pleura: There is a moderate volume left pleural effusion. A small loculated right pleural effusion is identified, image 85 of series 5. Atelectasis is noted within the right middle lobe and right lower lobe. There is airspace consolidation involving the left lower lobe. Musculoskeletal: No aggressive lytic or sclerotic bone lesions are identified. CT ABDOMEN AND PELVIS Hepatobiliary: There is a low density structure within the posterior right lobe of liver measuring 11 mm, image 52 of series 2. This is unchanged from previous exam. The gallbladder is normal. No biliary dilatation. Pancreas: Negative Spleen: Negative Adrenals/Urinary Tract: The normal appearance of the adrenal glands. The kidneys are unremarkable. Urinary bladder is negative. Stomach/Bowel: Stomach has a normal caliber. There is no abnormal pathologic dilatation of the small bowel loops. The appendix is visualized and appears normal. The proximal scratch set there is a right sided colostomy. The proximal large bowel loops have a normal caliber. There is been dehiscence of the Hartman's pouch along the suture line. There is an associated large complex fluid collection involving the left side of abdomen measuring 13.5 x 8.3 by 30.9 cm. This extends into the upper abdomen an abuts the greater curvature of the stomach. There are multiple smaller fluid collections scratch set there are smaller collections of gas and fluid identified elsewhere within the abdomen particularly along the left pericolic gutter and retroperitoneum and extending over the spleen. No significant fluid collections identified within the right side of abdomen. Vascular/Lymphatic: Normal appearance of the abdominal aorta. No enlarged  retroperitoneal or mesenteric adenopathy. No enlarged pelvic or inguinal lymph nodes. Reproductive: The prostate gland and seminal vesicles appear normal. Other: Multiple fluid  collections secondary to dehiscence of Hartman's pouch identified. Musculoskeletal: No aggressive lytic or sclerotic bone lesions. IMPRESSION: 1. Examination is significant for dehiscence of the Hartman's pouch along the suture line. There is a resultant large complex fluid collection involving much of the left hemi abdomen. Multiple smaller gas and fluid collections are identified scattered throughout the left abdomen especially within the left pericolic gutter and retroperitoneal space posterior to the left kidney. 2. Partially loculated right pleural effusion. 3. Moderate left effusion with overlying airspace consolidation. Critical Value/emergent results were called by telephone at the time of interpretation on 01/28/2015 at 1:41 pm to Dr. Hulen Luster, who verbally acknowledged these results. Electronically Signed   By: Kerby Moors M.D.   On: 01/28/2015 13:41   Ct Abdomen Pelvis W Contrast  01/28/2015  CLINICAL DATA:  Abscess.  Perforated diverticulitis. EXAM: CT CHEST, ABDOMEN, AND PELVIS WITH CONTRAST TECHNIQUE: Multidetector CT imaging of the chest, abdomen and pelvis was performed following the standard protocol during bolus administration of intravenous contrast. CONTRAST:  163mL OMNIPAQUE IOHEXOL 300 MG/ML  SOLN COMPARISON:  01/17/2015 FINDINGS: CT CHEST Mediastinum: The heart size is mildly enlarged. No pericardial effusion identified. The trachea appears patent and is midline. Unremarkable appearance of the esophagus. Multiple small lymph nodes are noted within the mediastinum. Likely reactive. No adenopathy identified. No supraclavicular or axillary adenopathy. Lungs/Pleura: There is a moderate volume left pleural effusion. A small loculated right pleural effusion is identified, image 85 of series 5. Atelectasis is noted within the right middle lobe and right lower lobe. There is airspace consolidation involving the left lower lobe. Musculoskeletal: No aggressive lytic or sclerotic bone lesions are  identified. CT ABDOMEN AND PELVIS Hepatobiliary: There is a low density structure within the posterior right lobe of liver measuring 11 mm, image 52 of series 2. This is unchanged from previous exam. The gallbladder is normal. No biliary dilatation. Pancreas: Negative Spleen: Negative Adrenals/Urinary Tract: The normal appearance of the adrenal glands. The kidneys are unremarkable. Urinary bladder is negative. Stomach/Bowel: Stomach has a normal caliber. There is no abnormal pathologic dilatation of the small bowel loops. The appendix is visualized and appears normal. The proximal scratch set there is a right sided colostomy. The proximal large bowel loops have a normal caliber. There is been dehiscence of the Hartman's pouch along the suture line. There is an associated large complex fluid collection involving the left side of abdomen measuring 13.5 x 8.3 by 30.9 cm. This extends into the upper abdomen an abuts the greater curvature of the stomach. There are multiple smaller fluid collections scratch set there are smaller collections of gas and fluid identified elsewhere within the abdomen particularly along the left pericolic gutter and retroperitoneum and extending over the spleen. No significant fluid collections identified within the right side of abdomen. Vascular/Lymphatic: Normal appearance of the abdominal aorta. No enlarged retroperitoneal or mesenteric adenopathy. No enlarged pelvic or inguinal lymph nodes. Reproductive: The prostate gland and seminal vesicles appear normal. Other: Multiple fluid collections secondary to dehiscence of Hartman's pouch identified. Musculoskeletal: No aggressive lytic or sclerotic bone lesions. IMPRESSION: 1. Examination is significant for dehiscence of the Hartman's pouch along the suture line. There is a resultant large complex fluid collection involving much of the left hemi abdomen. Multiple smaller gas and fluid collections are identified scattered throughout the left  abdomen especially within the left pericolic  gutter and retroperitoneal space posterior to the left kidney. 2. Partially loculated right pleural effusion. 3. Moderate left effusion with overlying airspace consolidation. Critical Value/emergent results were called by telephone at the time of interpretation on 01/28/2015 at 1:41 pm to Dr. Hulen Luster, who verbally acknowledged these results. Electronically Signed   By: Kerby Moors M.D.   On: 01/28/2015 13:41   Dg Chest Port 1 View  01/27/2015  CLINICAL DATA:  Respiratory distress. EXAM: PORTABLE CHEST 1 VIEW COMPARISON:  January 26, 2015. FINDINGS: Stable cardiomegaly and central pulmonary vascular congestion is noted. Bilateral perihilar and basilar interstitial densities are noted concerning for edema. Stable bilateral pleural effusions are noted, with left greater than right. No pneumothorax is noted. IMPRESSION: Stable bilateral opacities most consistent with edema with associated pleural effusions. Electronically Signed   By: Marijo Conception, M.D.   On: 01/27/2015 21:19    Anti-infectives: Anti-infectives    Start     Dose/Rate Route Frequency Ordered Stop   01/28/15 1500  piperacillin-tazobactam (ZOSYN) IVPB 3.375 g     3.375 g 12.5 mL/hr over 240 Minutes Intravenous 3 times per day 01/28/15 1409     01/18/15 0400  levofloxacin (LEVAQUIN) IVPB 750 mg  Status:  Discontinued     750 mg 100 mL/hr over 90 Minutes Intravenous Every 24 hours 01/18/15 0210 01/21/15 1108   01/18/15 0215  meropenem (MERREM) 1 g in sodium chloride 0.9 % 100 mL IVPB  Status:  Discontinued     1 g 200 mL/hr over 30 Minutes Intravenous 3 times per day 01/18/15 0210 01/26/15 0822   01/16/15 0930  ertapenem (INVANZ) 1 g in sodium chloride 0.9 % 50 mL IVPB  Status:  Discontinued     1 g 100 mL/hr over 30 Minutes Intravenous Every 24 hours 01/16/15 0841 01/18/15 0203   01/14/15 1245  piperacillin-tazobactam (ZOSYN) IVPB 3.375 g  Status:  Discontinued     3.375 g 12.5 mL/hr  over 240 Minutes Intravenous 3 times per day 01/14/15 1238 01/16/15 0841   01/14/15 1230  ciprofloxacin (CIPRO) IVPB 400 mg  Status:  Discontinued     400 mg 200 mL/hr over 60 Minutes Intravenous  Once 01/14/15 1218 01/14/15 1238   01/14/15 1230  metroNIDAZOLE (FLAGYL) IVPB 500 mg  Status:  Discontinued     500 mg 100 mL/hr over 60 Minutes Intravenous  Once 01/14/15 1218 01/14/15 1238      Assessment/Plan: s/p Procedure(s): EXPLORATORY LAPAROTOMY WITH LEFT COLECTOMY AND COLOSTOMY (N/A) Con't with abx Will ask IR to place LARGE perc drains to assist with evacuating IAA. Mobilize OK to restart diet & anticoag after drains placed Will follow    LOS: 15 days    Rosario Jacks., Anne Hahn 01/29/2015

## 2015-01-29 NOTE — Progress Notes (Signed)
Subjective:  Patient did not need BiPAP overnight. He said he is feeling better sitting up in the chair. He denies any shortness of breath or abdominal pain, only saying the his left side is "ticklish." He is agreeable to having drains placed today.  Objective: Vital signs in last 24 hours: Filed Vitals:   01/29/15 1327 01/29/15 1329 01/29/15 1331 01/29/15 1336  BP: 96/44 101/56 93/55 93/55   Pulse: 92 90 91 90  Temp:      TempSrc:      Resp: 25 26 22 22   Height:      Weight:      SpO2: 88% 86% 86% 88%   Weight change: 3 lb 4.8 oz (1.497 kg)  Intake/Output Summary (Last 24 hours) at 01/29/15 1337 Last data filed at 01/29/15 0600  Gross per 24 hour  Intake   1005 ml  Output   1000 ml  Net      5 ml   Physical Exam General: Mildly, appearing comfortable in char  Cardiovascular: Irregularly, irregular rhythm, no m/r/g. Tachycardic. Pulmonary: clear to anterior auscultation. No crackles today. Abdominal: Colostomy bag in place. Midline surgical site w/ wound vac. Wound site clean and dry. No abdominal or retroperitoneal tenderness. Says his left flank feels "sensitive" to touch Extremities: No clubbing, cyanosis, or edema. Psychiatric: Normal behavior and affect  Lab Results: Basic Metabolic Panel:  Recent Labs Lab 01/28/15 0442 01/29/15 0420  NA 138 140  K 3.2* 3.7  CL 88* 87*  CO2 42* 43*  GLUCOSE 125* 171*  BUN 16 23*  CREATININE 0.74 0.97  CALCIUM 8.2* 8.5*   CBC:  Recent Labs Lab 01/28/15 0442 01/29/15 0420  WBC 17.5* 18.3*  HGB 13.5 12.6*  HCT 41.4 38.7*  MCV 96.1 97.5  PLT 443* 383    CBG:  Recent Labs Lab 01/24/15 1208 01/28/15 1743 01/28/15 2137 01/29/15 0733 01/29/15 1133  GLUCAP 186* 329* 283* 159* 132*   Coagulation:  Recent Labs Lab 01/26/15 0432 01/27/15 0341 01/28/15 0442 01/29/15 0420  LABPROT 51.1* 35.5* 34.1* 16.7*  INR 5.93* 3.66* 3.47* 1.34   Micro Results: Recent Results (from the past 240 hour(s))  Surgical  pcr screen     Status: None   Collection Time: 01/28/15  3:52 PM  Result Value Ref Range Status   MRSA, PCR NEGATIVE NEGATIVE Final   Staphylococcus aureus NEGATIVE NEGATIVE Final    Comment:        The Xpert SA Assay (FDA approved for NASAL specimens in patients over 62 years of age), is one component of a comprehensive surveillance program.  Test performance has been validated by [facility] for patients greater than or equal to 41 year old. It is not intended to diagnose infection nor to guide or monitor treatment.   Culture, blood (routine x 2)     Status: None (Preliminary result)   Collection Time: 01/28/15  8:12 PM  Result Value Ref Range Status   Specimen Description BLOOD RIGHT HAND  Final   Special Requests IN PEDIATRIC BOTTLE 4.5CC  Final   Culture NO GROWTH < 12 HOURS  Final   Report Status PENDING  Incomplete  Culture, blood (routine x 2)     Status: None (Preliminary result)   Collection Time: 01/28/15  8:24 PM  Result Value Ref Range Status   Specimen Description BLOOD LEFT HAND  Final   Special Requests IN PEDIATRIC BOTTLE 6CC  Final   Culture NO GROWTH < 12 HOURS  Final  Report Status PENDING  Incomplete   Studies/Results: Ct Chest W Contrast  01/28/2015  CLINICAL DATA:  Abscess.  Perforated diverticulitis. EXAM: CT CHEST, ABDOMEN, AND PELVIS WITH CONTRAST TECHNIQUE: Multidetector CT imaging of the chest, abdomen and pelvis was performed following the standard protocol during bolus administration of intravenous contrast. CONTRAST:  129mL OMNIPAQUE IOHEXOL 300 MG/ML  SOLN COMPARISON:  01/17/2015 FINDINGS: CT CHEST Mediastinum: The heart size is mildly enlarged. No pericardial effusion identified. The trachea appears patent and is midline. Unremarkable appearance of the esophagus. Multiple small lymph nodes are noted within the mediastinum. Likely reactive. No adenopathy identified. No supraclavicular or axillary adenopathy. Lungs/Pleura: There is a moderate volume  left pleural effusion. A small loculated right pleural effusion is identified, image 85 of series 5. Atelectasis is noted within the right middle lobe and right lower lobe. There is airspace consolidation involving the left lower lobe. Musculoskeletal: No aggressive lytic or sclerotic bone lesions are identified. CT ABDOMEN AND PELVIS Hepatobiliary: There is a low density structure within the posterior right lobe of liver measuring 11 mm, image 52 of series 2. This is unchanged from previous exam. The gallbladder is normal. No biliary dilatation. Pancreas: Negative Spleen: Negative Adrenals/Urinary Tract: The normal appearance of the adrenal glands. The kidneys are unremarkable. Urinary bladder is negative. Stomach/Bowel: Stomach has a normal caliber. There is no abnormal pathologic dilatation of the small bowel loops. The appendix is visualized and appears normal. The proximal scratch set there is a right sided colostomy. The proximal large bowel loops have a normal caliber. There is been dehiscence of the Hartman's pouch along the suture line. There is an associated large complex fluid collection involving the left side of abdomen measuring 13.5 x 8.3 by 30.9 cm. This extends into the upper abdomen an abuts the greater curvature of the stomach. There are multiple smaller fluid collections scratch set there are smaller collections of gas and fluid identified elsewhere within the abdomen particularly along the left pericolic gutter and retroperitoneum and extending over the spleen. No significant fluid collections identified within the right side of abdomen. Vascular/Lymphatic: Normal appearance of the abdominal aorta. No enlarged retroperitoneal or mesenteric adenopathy. No enlarged pelvic or inguinal lymph nodes. Reproductive: The prostate gland and seminal vesicles appear normal. Other: Multiple fluid collections secondary to dehiscence of Hartman's pouch identified. Musculoskeletal: No aggressive lytic or  sclerotic bone lesions. IMPRESSION: 1. Examination is significant for dehiscence of the Hartman's pouch along the suture line. There is a resultant large complex fluid collection involving much of the left hemi abdomen. Multiple smaller gas and fluid collections are identified scattered throughout the left abdomen especially within the left pericolic gutter and retroperitoneal space posterior to the left kidney. 2. Partially loculated right pleural effusion. 3. Moderate left effusion with overlying airspace consolidation. Critical Value/emergent results were called by telephone at the time of interpretation on 01/28/2015 at 1:41 pm to Dr. Hulen Luster, who verbally acknowledged these results. Electronically Signed   By: Kerby Moors M.D.   On: 01/28/2015 13:41   Ct Abdomen Pelvis W Contrast  01/28/2015  CLINICAL DATA:  Abscess.  Perforated diverticulitis. EXAM: CT CHEST, ABDOMEN, AND PELVIS WITH CONTRAST TECHNIQUE: Multidetector CT imaging of the chest, abdomen and pelvis was performed following the standard protocol during bolus administration of intravenous contrast. CONTRAST:  184mL OMNIPAQUE IOHEXOL 300 MG/ML  SOLN COMPARISON:  01/17/2015 FINDINGS: CT CHEST Mediastinum: The heart size is mildly enlarged. No pericardial effusion identified. The trachea appears patent and is midline. Unremarkable appearance of  the esophagus. Multiple small lymph nodes are noted within the mediastinum. Likely reactive. No adenopathy identified. No supraclavicular or axillary adenopathy. Lungs/Pleura: There is a moderate volume left pleural effusion. A small loculated right pleural effusion is identified, image 85 of series 5. Atelectasis is noted within the right middle lobe and right lower lobe. There is airspace consolidation involving the left lower lobe. Musculoskeletal: No aggressive lytic or sclerotic bone lesions are identified. CT ABDOMEN AND PELVIS Hepatobiliary: There is a low density structure within the posterior right  lobe of liver measuring 11 mm, image 52 of series 2. This is unchanged from previous exam. The gallbladder is normal. No biliary dilatation. Pancreas: Negative Spleen: Negative Adrenals/Urinary Tract: The normal appearance of the adrenal glands. The kidneys are unremarkable. Urinary bladder is negative. Stomach/Bowel: Stomach has a normal caliber. There is no abnormal pathologic dilatation of the small bowel loops. The appendix is visualized and appears normal. The proximal scratch set there is a right sided colostomy. The proximal large bowel loops have a normal caliber. There is been dehiscence of the Hartman's pouch along the suture line. There is an associated large complex fluid collection involving the left side of abdomen measuring 13.5 x 8.3 by 30.9 cm. This extends into the upper abdomen an abuts the greater curvature of the stomach. There are multiple smaller fluid collections scratch set there are smaller collections of gas and fluid identified elsewhere within the abdomen particularly along the left pericolic gutter and retroperitoneum and extending over the spleen. No significant fluid collections identified within the right side of abdomen. Vascular/Lymphatic: Normal appearance of the abdominal aorta. No enlarged retroperitoneal or mesenteric adenopathy. No enlarged pelvic or inguinal lymph nodes. Reproductive: The prostate gland and seminal vesicles appear normal. Other: Multiple fluid collections secondary to dehiscence of Hartman's pouch identified. Musculoskeletal: No aggressive lytic or sclerotic bone lesions. IMPRESSION: 1. Examination is significant for dehiscence of the Hartman's pouch along the suture line. There is a resultant large complex fluid collection involving much of the left hemi abdomen. Multiple smaller gas and fluid collections are identified scattered throughout the left abdomen especially within the left pericolic gutter and retroperitoneal space posterior to the left kidney. 2.  Partially loculated right pleural effusion. 3. Moderate left effusion with overlying airspace consolidation. Critical Value/emergent results were called by telephone at the time of interpretation on 01/28/2015 at 1:41 pm to Dr. Hulen Luster, who verbally acknowledged these results. Electronically Signed   By: Kerby Moors M.D.   On: 01/28/2015 13:41   Dg Chest Port 1 View  01/27/2015  CLINICAL DATA:  Respiratory distress. EXAM: PORTABLE CHEST 1 VIEW COMPARISON:  January 26, 2015. FINDINGS: Stable cardiomegaly and central pulmonary vascular congestion is noted. Bilateral perihilar and basilar interstitial densities are noted concerning for edema. Stable bilateral pleural effusions are noted, with left greater than right. No pneumothorax is noted. IMPRESSION: Stable bilateral opacities most consistent with edema with associated pleural effusions. Electronically Signed   By: Marijo Conception, M.D.   On: 01/27/2015 21:19   Medications: I have reviewed the patient's current medications. Scheduled Meds: . antiseptic oral rinse  7 mL Mouth Rinse BID  . digoxin  0.125 mg Oral Daily  . diltiazem  90 mg Oral 3 times per day  . feeding supplement  1 Container Oral TID BM  . fentaNYL      . furosemide  40 mg Oral Daily  . Gerhardt's butt cream   Topical Daily  . insulin aspart  0-9 Units Subcutaneous  TID WC  . lidocaine      . metoprolol tartrate  150 mg Oral BID  . midazolam      . oxyCODONE  20 mg Oral Q12H  . pantoprazole  40 mg Oral Q1200  . 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 Repeat CT scan was obtained given ongoing  difficulties managing his RVR and respiratory status. Surgery defers further ex-laps for now, recommends percutaneous drains for abscess. IR will place drains today. - oxycontin 20mg  BID scheduled for pain- goal of relief of pain to 4/10. Oxycodone 5mg  q4h prn for breakthrough pain.  - Levaquin (1/1 - 1/4 discontinued), Meropenem 1 mg q8h (1/1-1/9), Zosyn (1/11 - )  Atrial Fibrillation with RVR: HR better controlled (72-127). INR reversed to 1.3 after 10 mg vitamin K for drain placement. Will start pradaxa after procedure - Lopressor 150 mg BID, HRs in the 120s this AM. - Oral diltiazem 90 mg TID - digoxin 0.125 mg po daily  Hypoxia secondary to pulmonary edema: Satting ~90% on 3LNC. Did not need bipap last night. - lasix 40 mgpo daily - BiPAP prn - Wean O2 as tolerated - Atrovent and Xopenex nebulizer q2h prn  Anxiety: Ativan 0.5 mg IV BID  DVT prophylaxis: Warfarin  Dispo: Disposition is deferred at this time, awaiting improvement of current medical problems.  Anticipated discharge in approximately 2-3 day(s).   The patient does have a current PCP Thompson Grayer, MD) and does not need an [facility] hospital follow-up appointment after discharge. Will set pt up with [facility]MTS clinic for follow up.   The patient does have transportation limitations that hinder transportation to clinic appointments.  .Services Needed at time of discharge: Y = Yes, Blank = No PT:   OT:   RN:   Equipment:   Other:     LOS: 15 days   Liberty Handy, MD 01/29/2015, 1:37 PM

## 2015-01-29 NOTE — Sedation Documentation (Signed)
Patient is resting comfortably. 

## 2015-01-29 NOTE — Procedures (Signed)
Interventional Radiology Procedure Note  Procedure: Placement of 11F drainage tube into abd abscess.  ~1000cc of brown feculent fluid evacuated.  Sample sent to lab.  Complications: None.  Recommendations:  - BID drain flush.  - Do not submerge - Routine drain care - May follow up in [place] drain clinic if needed as outpatient.    Signed,  Dulcy Fanny. Earleen Newport, DO

## 2015-01-29 NOTE — Progress Notes (Signed)
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  Extubated 1/1.    PT Comments    Pt supine in bed and very wet with urine.  When asked if pt knew he had urinated, he thought he had been sweating and that was why he was wet.  Had pt A with performing peri hygiene, but pt still required Moderate amount of A.  Continue to feel pt would benefit from CIR therapies to maximize independence prior to returning to home.  Will continue to follow.    Follow Up Recommendations  CIR     Equipment Recommendations  None recommended by PT    Recommendations for Other Services       Precautions / Restrictions Precautions Precautions: Fall Precaution Comments: Wound Vac on Abdomen Restrictions Weight Bearing Restrictions: No    Mobility  Bed Mobility Overal bed mobility: Needs Assistance Bed Mobility: Supine to Sit     Supine to sit: Min guard     General bed mobility comments: Definite use of bed rails and increased time to complete.    Transfers Overall transfer level: Needs assistance Equipment used: Rolling walker (2 wheeled) Transfers: Sit to/from [facility] Sit to Stand: Min assist Stand pivot transfers: Min guard;+2 safety/equipment       General transfer comment: pt needs A for power up to standing and 2 person A for management of O2, Wound vac, IV lines, and monitor.    Ambulation/Gait                 Stairs            Wheelchair Mobility    Modified Rankin (Stroke Patients Only)       Balance Overall balance assessment: Needs assistance Sitting-balance support: No upper extremity supported;Feet supported Sitting balance-Leahy  Scale: Good     Standing balance support: Bilateral upper extremity supported;Single extremity supported;During functional activity Standing balance-Leahy Scale: Fair Standing balance comment: pt able to stand while getting A for peri hygiene and cleaning the back of his legs.                    Cognition Arousal/Alertness: Awake/alert Behavior During Therapy: WFL for tasks assessed/performed;Anxious Overall Cognitive Status: No family/caregiver present to determine baseline cognitive functioning                      Exercises      General Comments        Pertinent Vitals/Pain Pain Assessment: Faces Faces Pain Scale: Hurts little more Pain Location: Grimaces during bed mobility and indicates his abdomen. Pain Descriptors / Indicators: Grimacing;Guarding Pain Intervention(s): Monitored during session;Premedicated before session;Repositioned    Home Living                      Prior Function            PT Goals (current goals can now be found in the care plan section) Acute Rehab PT Goals Patient Stated Goal: get stronger PT Goal Formulation: With patient Time For Goal Achievement: 02/10/15 Potential to Achieve Goals: Good Progress towards PT goals: Progressing toward goals    Frequency  Min 3X/week  PT Plan Current plan remains appropriate    Co-evaluation             End of Session Equipment Utilized During Treatment: Oxygen Activity Tolerance: Patient tolerated treatment well Patient left: in chair;with call bell/phone within reach     Time: 0908-0927 PT Time Calculation (min) (ACUTE ONLY): 19 min  Charges:  $Therapeutic Activity: 8-22 mins                    G CodesCatarina Hartshorn, Humphrey 01/29/2015, 10:22 AM

## 2015-01-29 NOTE — Sedation Documentation (Signed)
Denies pain

## 2015-01-29 NOTE — Sedation Documentation (Signed)
Will transport back on 5L/[place]

## 2015-01-29 NOTE — Sedation Documentation (Signed)
MD aware of VS

## 2015-01-29 NOTE — Progress Notes (Signed)
Report received in patient's room via [facility] RN using SBAR format, reviewed chart, orders, labs, VS, meds and patient's general condition, looked at patient's left sided drain and his right upper colostomy, placed SCD's on and turned on his abdominal wound vac to 125 mm Hg with small amount of serosanq. Drainage in the cannister, assumed care of patient, new orders to start Heparin drip for his A-Fib, will start as ordered when assessing patient.

## 2015-01-29 NOTE — Progress Notes (Signed)
Patient Name: William Fischer Date of Encounter: 01/29/2015     Active Problems:   Diverticulitis of colon with perforation   Hypoxia   Atrial fibrillation with rapid ventricular response (HCC)   Systolic dysfunction with acute on chronic heart failure (HCC)   Perforated bowel (HCC)   Respiratory failure (HCC)   Dyspnea   Pleural effusion   Pulmonary edema   Pressure ulcer   Tachypnea   Bowel perforation (HCC)    SUBJECTIVE  No complaints. No CP or SOB. No orthopnea or PND.   CURRENT MEDS . antiseptic oral rinse  7 mL Mouth Rinse BID  . digoxin  0.125 mg Oral Daily  . diltiazem  90 mg Oral 3 times per day  . feeding supplement  1 Container Oral TID BM  . Gerhardt's butt cream   Topical Daily  . insulin aspart  0-9 Units Subcutaneous TID WC  . metoprolol tartrate  150 mg Oral BID  . oxyCODONE  20 mg Oral Q12H  . pantoprazole  40 mg Oral Q1200  . piperacillin-tazobactam (ZOSYN)  IV  3.375 g Intravenous 3 times per day    OBJECTIVE  Filed Vitals:   01/29/15 0600 01/29/15 0630 01/29/15 0700 01/29/15 0800  BP: 98/78 104/53 122/72 109/65  Pulse: 95 105 95 101  Temp:      TempSrc:      Resp: 17 22 24 19   Height:      Weight:      SpO2: 90% 91% 91% 90%    Intake/Output Summary (Last 24 hours) at 01/29/15 0847 Last data filed at 01/29/15 0600  Gross per 24 hour  Intake   1005 ml  Output   1550 ml  Net   -545 ml   Filed Weights   01/27/15 0600 01/28/15 0623 01/29/15 0515  Weight: 280 lb (127.007 kg) 280 lb 8 oz (127.234 kg) 283 lb 12.8 oz (128.731 kg)    PHYSICAL EXAM  General: Pleasant, NAD. Neuro: Alert and oriented X 3. Moves all extremities spontaneously. Psych: Normal affect. HEENT:  Normal  Neck: Supple without bruits or JVD. Lungs:  Resp regular and unlabored, CTA. Heart: Irrieg irreg no s3, s4, or murmurs. Abdomen: Soft, non-tender, non-distended, BS + x 4.  Extremities: No clubbing, cyanosis or edema. DP/PT/Radials 2+ and equal  bilaterally.  Accessory Clinical Findings  CBC  Recent Labs  01/28/15 0442 01/29/15 0420  WBC 17.5* 18.3*  HGB 13.5 12.6*  HCT 41.4 38.7*  MCV 96.1 97.5  PLT 443* [id number]   Basic Metabolic Panel  Recent Labs  01/28/15 0442 01/29/15 0420  NA 138 140  K 3.2* 3.7  CL 88* 87*  CO2 42* 43*  GLUCOSE 125* 171*  BUN 16 23*  CREATININE 0.74 0.97  CALCIUM 8.2* 8.5*     TELE  afib with controlled VR . HR in 90s  Radiology/Studies  Ct Abdomen Pelvis Wo Contrast  01/17/2015  CLINICAL DATA:  Perforated bowel.  Followup. EXAM: CT ABDOMEN AND PELVIS WITHOUT CONTRAST TECHNIQUE: Multidetector CT imaging of the abdomen and pelvis was performed following the standard protocol without IV contrast. COMPARISON:  01/14/2015 FINDINGS: Lower chest: Increased left pleural effusion and left basilar consolidation. Increased right lower lobe atelectasis. Heart is mildly enlarged. Upper abdomen: Significant free intraperitoneal air is now noted. Gas now tracts within the mesentery and extends into the upper abdomen within the retroperitoneum. Small low-attenuation lesion within the posterior aspect of the liver, stable in appearance a not completely characterized measuring 1.0  cm. No focal abnormality within the spleen, pancreas, or kidneys. The adrenal glands are prominent bilaterally without focal mass. Gallbladder is present. Gastrointestinal tract: The colon has a thickened wall beginning at the proximal descending colon, extending through the upper sigmoid colon. There are scattered diverticula. Significant gas is identified surrounding the descending and sigmoid colon, increased since prior study. The adjacent small bowel loops in the left lower quadrant have significant thickening of the wall. Pelvis: There is a new fluid collection in the pelvis measuring 5.5 x 6.4 cm. This is a amorphous and I doubt this has become an abscess. There is fluid within the sigmoid mesentery but this does not appear to be  consolidated at this time. Retroperitoneum: No evidence for aortic aneurysm. No retroperitoneal or mesenteric adenopathy. Abdominal wall: Unremarkable. Osseous structures: Unremarkable. IMPRESSION: 1. Interval development of significant amount of free intraperitoneal air. 2. Gas and fluid within the sigmoid mesenteric have increased. 3. Fluid collection in the pelvis 6.4 cm without evidence for abscess. 4. Increased left pleural effusion, left basilar consolidation, and right lower lobe atelectasis. 5. Prominent adrenal glands bilaterally, stable in appearance. 6. Critical Value/emergent results were discussed in person at the time of interpretation on 01/17/2015 at 1:30 pm to Dr. Liberty Handy. Electronically Signed   By: Nolon Nations M.D.   On: 01/17/2015 13:54   Ct Chest W Contrast  01/28/2015  CLINICAL DATA:  Abscess.  Perforated diverticulitis. EXAM: CT CHEST, ABDOMEN, AND PELVIS WITH CONTRAST TECHNIQUE: Multidetector CT imaging of the chest, abdomen and pelvis was performed following the standard protocol during bolus administration of intravenous contrast. CONTRAST:  165mL OMNIPAQUE IOHEXOL 300 MG/ML  SOLN COMPARISON:  01/17/2015 FINDINGS: CT CHEST Mediastinum: The heart size is mildly enlarged. No pericardial effusion identified. The trachea appears patent and is midline. Unremarkable appearance of the esophagus. Multiple small lymph nodes are noted within the mediastinum. Likely reactive. No adenopathy identified. No supraclavicular or axillary adenopathy. Lungs/Pleura: There is a moderate volume left pleural effusion. A small loculated right pleural effusion is identified, image 85 of series 5. Atelectasis is noted within the right middle lobe and right lower lobe. There is airspace consolidation involving the left lower lobe. Musculoskeletal: No aggressive lytic or sclerotic bone lesions are identified. CT ABDOMEN AND PELVIS Hepatobiliary: There is a low density structure within the posterior right  lobe of liver measuring 11 mm, image 52 of series 2. This is unchanged from previous exam. The gallbladder is normal. No biliary dilatation. Pancreas: Negative Spleen: Negative Adrenals/Urinary Tract: The normal appearance of the adrenal glands. The kidneys are unremarkable. Urinary bladder is negative. Stomach/Bowel: Stomach has a normal caliber. There is no abnormal pathologic dilatation of the small bowel loops. The appendix is visualized and appears normal. The proximal scratch set there is a right sided colostomy. The proximal large bowel loops have a normal caliber. There is been dehiscence of the Hartman's pouch along the suture line. There is an associated large complex fluid collection involving the left side of abdomen measuring 13.5 x 8.3 by 30.9 cm. This extends into the upper abdomen an abuts the greater curvature of the stomach. There are multiple smaller fluid collections scratch set there are smaller collections of gas and fluid identified elsewhere within the abdomen particularly along the left pericolic gutter and retroperitoneum and extending over the spleen. No significant fluid collections identified within the right side of abdomen. Vascular/Lymphatic: Normal appearance of the abdominal aorta. No enlarged retroperitoneal or mesenteric adenopathy. No enlarged pelvic or inguinal lymph  nodes. Reproductive: The prostate gland and seminal vesicles appear normal. Other: Multiple fluid collections secondary to dehiscence of Hartman's pouch identified. Musculoskeletal: No aggressive lytic or sclerotic bone lesions. IMPRESSION: 1. Examination is significant for dehiscence of the Hartman's pouch along the suture line. There is a resultant large complex fluid collection involving much of the left hemi abdomen. Multiple smaller gas and fluid collections are identified scattered throughout the left abdomen especially within the left pericolic gutter and retroperitoneal space posterior to the left kidney. 2.  Partially loculated right pleural effusion. 3. Moderate left effusion with overlying airspace consolidation. Critical Value/emergent results were called by telephone at the time of interpretation on 01/28/2015 at 1:41 pm to Dr. Hulen Luster, who verbally acknowledged these results. Electronically Signed   By: Kerby Moors M.D.   On: 01/28/2015 13:41   Ct Abdomen Pelvis W Contrast  01/28/2015  CLINICAL DATA:  Abscess.  Perforated diverticulitis. EXAM: CT CHEST, ABDOMEN, AND PELVIS WITH CONTRAST TECHNIQUE: Multidetector CT imaging of the chest, abdomen and pelvis was performed following the standard protocol during bolus administration of intravenous contrast. CONTRAST:  144mL OMNIPAQUE IOHEXOL 300 MG/ML  SOLN COMPARISON:  01/17/2015 FINDINGS: CT CHEST Mediastinum: The heart size is mildly enlarged. No pericardial effusion identified. The trachea appears patent and is midline. Unremarkable appearance of the esophagus. Multiple small lymph nodes are noted within the mediastinum. Likely reactive. No adenopathy identified. No supraclavicular or axillary adenopathy. Lungs/Pleura: There is a moderate volume left pleural effusion. A small loculated right pleural effusion is identified, image 85 of series 5. Atelectasis is noted within the right middle lobe and right lower lobe. There is airspace consolidation involving the left lower lobe. Musculoskeletal: No aggressive lytic or sclerotic bone lesions are identified. CT ABDOMEN AND PELVIS Hepatobiliary: There is a low density structure within the posterior right lobe of liver measuring 11 mm, image 52 of series 2. This is unchanged from previous exam. The gallbladder is normal. No biliary dilatation. Pancreas: Negative Spleen: Negative Adrenals/Urinary Tract: The normal appearance of the adrenal glands. The kidneys are unremarkable. Urinary bladder is negative. Stomach/Bowel: Stomach has a normal caliber. There is no abnormal pathologic dilatation of the small bowel loops.  The appendix is visualized and appears normal. The proximal scratch set there is a right sided colostomy. The proximal large bowel loops have a normal caliber. There is been dehiscence of the Hartman's pouch along the suture line. There is an associated large complex fluid collection involving the left side of abdomen measuring 13.5 x 8.3 by 30.9 cm. This extends into the upper abdomen an abuts the greater curvature of the stomach. There are multiple smaller fluid collections scratch set there are smaller collections of gas and fluid identified elsewhere within the abdomen particularly along the left pericolic gutter and retroperitoneum and extending over the spleen. No significant fluid collections identified within the right side of abdomen. Vascular/Lymphatic: Normal appearance of the abdominal aorta. No enlarged retroperitoneal or mesenteric adenopathy. No enlarged pelvic or inguinal lymph nodes. Reproductive: The prostate gland and seminal vesicles appear normal. Other: Multiple fluid collections secondary to dehiscence of Hartman's pouch identified. Musculoskeletal: No aggressive lytic or sclerotic bone lesions. IMPRESSION: 1. Examination is significant for dehiscence of the Hartman's pouch along the suture line. There is a resultant large complex fluid collection involving much of the left hemi abdomen. Multiple smaller gas and fluid collections are identified scattered throughout the left abdomen especially within the left pericolic gutter and retroperitoneal space posterior to the left kidney. 2. Partially  loculated right pleural effusion. 3. Moderate left effusion with overlying airspace consolidation. Critical Value/emergent results were called by telephone at the time of interpretation on 01/28/2015 at 1:41 pm to Dr. Hulen Luster, who verbally acknowledged these results. Electronically Signed   By: Kerby Moors M.D.   On: 01/28/2015 13:41   Dg Chest Port 1 View  01/27/2015  CLINICAL DATA:  Respiratory  distress. EXAM: PORTABLE CHEST 1 VIEW COMPARISON:  January 26, 2015. FINDINGS: Stable cardiomegaly and central pulmonary vascular congestion is noted. Bilateral perihilar and basilar interstitial densities are noted concerning for edema. Stable bilateral pleural effusions are noted, with left greater than right. No pneumothorax is noted. IMPRESSION: Stable bilateral opacities most consistent with edema with associated pleural effusions. Electronically Signed   By: Marijo Conception, M.D.   On: 01/27/2015 21:19   Dg Chest Port 1 View  01/26/2015  CLINICAL DATA:  Pulmonary edema and shortness of breath EXAM: PORTABLE CHEST 1 VIEW COMPARISON:  Yesterday FINDINGS: Unchanged hazy appearance of the bilateral chest, left more than right, compatible with effusions. The left effusion is likely moderate volume. There is underlying symmetric interstitial and airspace opacity. Chronic cardiomegaly and stable vascular pedicle widening, accentuated by rightward rotation. No pneumothorax. IMPRESSION: Unchanged CHF pattern including left more than right pleural effusion. Electronically Signed   By: Monte Fantasia M.D.   On: 01/26/2015 11:29   Dg Chest Port 1 View  01/25/2015  CLINICAL DATA:  Acute onset of shortness of breath. EXAM: PORTABLE CHEST 1 VIEW COMPARISON:  Chest radiograph performed 01/23/2015 FINDINGS: Bilateral central airspace opacification is noted, with underlying vascular congestion and small to moderate bilateral pleural effusions, compatible with pulmonary edema. This appears worsened from the prior study. No pneumothorax is seen. The cardiomediastinal silhouette is enlarged. No acute osseous abnormalities are identified. IMPRESSION: Worsening pulmonary edema, with small to moderate bilateral pleural effusions. Underlying vascular congestion and cardiomegaly. Electronically Signed   By: Garald Balding M.D.   On: 01/25/2015 06:01   Dg Chest Port 1 View  01/23/2015  CLINICAL DATA:  Status post exploratory  laparotomy and subsequent colectomy and colostomy placement for perforated bowel secondary to diverticulitis; hypoxia, atrial fibrillation, acute and chronic CHF. EXAM: PORTABLE CHEST 1 VIEW COMPARISON:  Portable chest x-ray of January 21, 2015 FINDINGS: The lungs are mildly hypoinflated. The interstitial markings on the left have improved. The hemidiaphragms remain it obscured. The cardiac silhouette remains enlarged. The pulmonary vascularity is slightly less engorged centrally. The observed bony thorax is unremarkable. IMPRESSION: Interval improvement in the pulmonary interstitium consistent with decreasing interstitial edema. There is persistent cardiomegaly, pulmonary interstitial edema, and small bilateral pleural effusions. Electronically Signed   By: David  [place] M.D.   On: 01/23/2015 07:41   Dg Chest Port 1 View  01/22/2015  CLINICAL DATA:  Dyspnea. EXAM: PORTABLE CHEST 1 VIEW COMPARISON:  01/19/2015 FINDINGS: Enteric tube is been removed. Tip of the right central line in the region of the proximal SVC. Low lung volumes again seen. Progressive bibasilar opacities, likely combination of pleural effusion and consolidation. Heart appears prominent in size. Vascular congestion suspected, not well assessed due to low lung volumes. IMPRESSION: Hypoventilatory chest with progressive bibasilar opacities, likely combination of pleural effusion and consolidation. Consolidation may be related to atelectasis, aspiration, or pneumonia. Electronically Signed   By: Jeb Levering M.D.   On: 01/22/2015 00:02   Dg Chest Port 1 View  01/19/2015  CLINICAL DATA:  50 year old male with respiratory distress shortness of breath. EXAM: PORTABLE CHEST 1  VIEW COMPARISON:  Chest x-ray 01/17/2015. FINDINGS: Patient has been extubated. A nasogastric tube is seen extending into the stomach, however, the tip of the nasogastric tube extends below the lower margin of the image. There is a right-sided internal jugular central venous  catheter with tip terminating in the proximal superior vena cava. Lung volumes are slightly low. Widespread patchy asymmetrically distributed interstitial and airspace opacities throughout the lungs bilaterally, with relative sparing of the right upper lobe. Overall aeration is very similar to the prior study. Small right and moderate left pleural effusions. Pulmonary vasculature does not appear engorged. Heart size is mildly enlarged. Upper mediastinal contours are within normal limits. IMPRESSION: 1. Support apparatus, as above. 2. Patchy asymmetrically distributed interstitial and airspace opacities in the lungs bilaterally favored to reflect a multilobar pneumonia. 3. Mild cardiomegaly. Electronically Signed   By: Vinnie Langton M.D.   On: 01/19/2015 07:26   Dg Chest Port 1 View  01/17/2015  CLINICAL DATA:  Central line placement.  Initial encounter. EXAM: PORTABLE CHEST 1 VIEW COMPARISON:  Chest radiograph performed earlier today at 10:49 a.m. FINDINGS: The patient's endotracheal tube is seen ending 6 cm above the carina. The enteric tube is noted extending below the diaphragm. A right IJ line is noted ending about the proximal SVC. The lungs are well-aerated. Small bilateral pleural effusions noted. Vascular congestion is seen, with bilateral airspace opacification, left greater than right. This raises concern for mildly worsening asymmetric pulmonary edema. There is no evidence of pneumothorax. The cardiomediastinal silhouette is enlarged. No acute osseous abnormalities are seen. IMPRESSION: 1. Endotracheal tube seen ending 6 cm above the carina. 2. Right IJ line noted ending about the proximal SVC. 3. Small bilateral pleural effusions noted. Vascular congestion and cardiomegaly, with bilateral airspace opacification, left greater than right. This raises concern for mildly worsening asymmetric pulmonary edema. Electronically Signed   By: Garald Balding M.D.   On: 01/17/2015 21:41   Dg Chest Port 1  View  01/17/2015  CLINICAL DATA:  Dyspnea, atrial fibrillation, chronic heart failure and cardiomyopathy. EXAM: PORTABLE CHEST 1 VIEW COMPARISON:  01/15/2015 FINDINGS: Degree of pulmonary edema is mildly improved with moderate interstitial edema remaining. Component of left pleural fluid appears less prominent. Heart size also appears less prominent on the portable x-ray. IMPRESSION: Mildly improving pulmonary edema and diminished prominence of left pleural fluid. The heart size also appears less prominent by x-ray. Electronically Signed   By: Aletta Edouard M.D.   On: 01/17/2015 11:20   Dg Chest Port 1 View  01/15/2015  CLINICAL DATA:  Hypoxia. EXAM: PORTABLE CHEST 1 VIEW COMPARISON:  November 30, 2011. FINDINGS: Stable cardiomegaly is noted. Central pulmonary vascular congestion is noted with mild bilateral perihilar and basilar interstitial densities concerning for possible edema. No pneumothorax is noted. Probable mild left pleural effusion is noted. Bony thorax is unremarkable. IMPRESSION: Cardiomegaly and central pulmonary vascular congestion is noted with probable mild bilateral perihilar and basilar pulmonary edema. Probable mild left pleural effusion is noted. Electronically Signed   By: Marijo Conception, M.D.   On: 01/15/2015 10:16   Ct Renal Stone Study  01/14/2015  CLINICAL DATA:  Bilateral groin pain for 5 days.  Hematuria. EXAM: CT ABDOMEN AND PELVIS WITHOUT CONTRAST TECHNIQUE: Multidetector CT imaging of the abdomen and pelvis was performed following the standard protocol without IV contrast. COMPARISON:  None. FINDINGS: Small left pleural effusion. Left lower lobe atelectasis or infiltrate. Cannot exclude pneumonia. Right lung bases clear. Heart is upper limits normal in  size. Liver, spleen, pancreas, adrenals and kidneys have an unremarkable unenhanced appearance. Gallbladder unremarkable. There is extensive pelvic and left retroperitoneal gas. Inflammatory changes in the left  retroperitoneum and surrounding the descending colon and sigmoid colon. The gas appears to communicate with a diverticulum in the proximal sigmoid colon there is an area of wall thickening. Findings most compatible with perforated diverticulitis. Trace free fluid in the cul-de-sac. Stomach and small bowel and appendix are normal. Small umbilical hernia containing fat. Small bilateral inguinal hernias containing fat. No acute bony abnormality. IMPRESSION: Extensive gas throughout the left retroperitoneum with extensive stranding around the descending colon and sigmoid colon. This appears to be related to perforated diverticulitis in the proximal sigmoid colon. Small left pleural effusion. Left lower lobe atelectasis or infiltrate. Cannot exclude pneumonia. Electronically Signed   By: Rolm Baptise M.D.   On: 01/14/2015 12:10    ASSESSMENT AND PLAN  LOUIS BOLSINGER is a 50 y.o. male with a past medical history significant for longstanding persistent atrial fibrillation, previous tachycardia induced cardiomyopathy, depression, and obesity who presented on 01/14/15 for abdominal pain. He was found to have perforated sigmoid colon likely diverticulitis s/p left colectomy end colostomy on 01/17/15. She has had issues with afib with RVR and cardiology consulted.   Atrial fibrillation/flutter with RVR: Rate was difficult to control secondary to stress of surgery and CHF.  Failed prior attempts at DCCV/rhythm control. Severe LAE and morbid obesity indicate he is a poor candidate for ablation. Will continue focus on rate control. On metoprolol150 mg bid, digoxin 0.125 mg daily and diltiazem 90 mg tid with much better control currently. On coumadin for anticoagulation- managed by pharmacy, INR was supratheraputic yesterday and reversed due to need to place drain. INR now 1.34   Acute on chronic combined systolic/diastolic CHF: EF [id number]. Exacerbated by rapid heart rate and fluid shifts from surgery. Excellent  diuresis 01/27/15. I/O negative 12.5 liters. Lasix now discontinued.  Alkalosis a little better after diamox yesterday. CTA yesterday with increased left pleural effusion. Will start oral lasix 40 mg daily  OSA Encourage BIPAP at night.  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 Reversing coumadin. Plan for IR to drain vs exlap. Per surgery okay to start anticoag after drains placed   Signed, Eileen Stanford PA-C  Pager [phone]  Patient seen and examined and history reviewed. Agree with above findings and plan. Patient looks much better today. Afib rate well controlled now. Breathing is much better. On Exam and CT he still has pleural effusions L>R. Right effusion appears loculated suggesting this is inflammatory. Will DC IV lasix. Start 40 mg po daily. Coumadin reversed for per drain placement.  Eshal Propps [place], [place] 01/29/2015 9:45 AM

## 2015-01-30 DIAGNOSIS — K651 Peritoneal abscess: Secondary | ICD-10-CM | POA: Insufficient documentation

## 2015-01-30 DIAGNOSIS — R5381 Other malaise: Secondary | ICD-10-CM

## 2015-01-30 LAB — GLUCOSE, CAPILLARY
GLUCOSE-CAPILLARY: 125 mg/dL — AB (ref 65–99)
GLUCOSE-CAPILLARY: 123 mg/dL — AB (ref 65–99)
Glucose-Capillary: 189 mg/dL — ABNORMAL HIGH (ref 65–99)
Glucose-Capillary: 172 mg/dL — ABNORMAL HIGH (ref 65–99)

## 2015-01-30 LAB — CBC
HCT: 39.6 % (ref 39.0–52.0)
Hemoglobin: 12.8 g/dL — ABNORMAL LOW (ref 13.0–17.0)
MCH: 30.8 pg (ref 26.0–34.0)
MCHC: 32.3 g/dL (ref 30.0–36.0)
MCV: 95.2 fL (ref 78.0–100.0)
PLATELETS: 349 10*3/uL (ref 150–400)
RBC: 4.16 MIL/uL — AB (ref 4.22–5.81)
RDW: 15.4 % (ref 11.5–15.5)
WBC: 9.4 10*3/uL (ref 4.0–10.5)

## 2015-01-30 LAB — BASIC METABOLIC PANEL
Anion gap: 8 (ref 5–15)
BUN: 21 mg/dL — AB (ref 6–20)
CO2: 43 mmol/L — AB (ref 22–32)
Calcium: 8 mg/dL — ABNORMAL LOW (ref 8.9–10.3)
Chloride: 88 mmol/L — ABNORMAL LOW (ref 101–111)
Creatinine, Ser: 0.96 mg/dL (ref 0.61–1.24)
GFR calc Af Amer: >60 mL/min (ref >60)
GFR calc non Af Amer: >60 mL/min (ref >60)
GLUCOSE: 145 mg/dL — AB (ref 65–99)
Potassium: 3.4 mmol/L — ABNORMAL LOW (ref 3.5–5.1)
SODIUM: 139 mmol/L (ref 135–145)

## 2015-01-30 LAB — HEPARIN LEVEL (UNFRACTIONATED)
Heparin Unfractionated: <0.1 IU/mL — ABNORMAL LOW (ref 0.30–0.70)
Heparin Unfractionated: <0.1 IU/mL — ABNORMAL LOW (ref 0.30–0.70)

## 2015-01-30 LAB — PROTIME-INR
INR: 1.22 (ref 0.00–1.49)
PROTHROMBIN TIME: 15.6 s — AB (ref 11.6–15.2)

## 2015-01-30 MED ORDER — DILTIAZEM HCL 60 MG PO TABS
60.0000 mg | ORAL_TABLET | Freq: Three times a day (TID) | ORAL | Status: DC
  Administered 2015-01-30 – 2015-01-31 (×2): 60 mg via ORAL
  Filled 2015-01-30 (×2): qty 1

## 2015-01-30 NOTE — Progress Notes (Signed)
Patient Name: William Fischer Date of Encounter: 01/30/2015     Active Problems:   Diverticulitis of colon with perforation   Hypoxia   Atrial fibrillation with rapid ventricular response (HCC)   Systolic dysfunction with acute on chronic heart failure (HCC)   Perforated bowel (HCC)   Respiratory failure (HCC)   Dyspnea   Pleural effusion   Pulmonary edema   Pressure ulcer   Tachypnea   Bowel perforation (HCC)    SUBJECTIVE  No complaints. Feels better after drain placed. No CP or SOB. No orthopnea or PND.   CURRENT MEDS . antiseptic oral rinse  7 mL Mouth Rinse BID  . digoxin  0.125 mg Oral Daily  . diltiazem  90 mg Oral 3 times per day  . feeding supplement  1 Container Oral TID BM  . furosemide  40 mg Oral Daily  . Gerhardt's butt cream   Topical Daily  . insulin aspart  0-9 Units Subcutaneous TID WC  . metoprolol tartrate  150 mg Oral BID  . oxyCODONE  20 mg Oral Q12H  . pantoprazole  40 mg Oral Q1200  . piperacillin-tazobactam (ZOSYN)  IV  3.375 g Intravenous 3 times per day    OBJECTIVE  Filed Vitals:   01/29/15 2354 01/30/15 0500 01/30/15 0530 01/30/15 0758  BP: 90/54 122/77 122/77 107/64  Pulse: 93 95  73  Temp: 98.3 F (36.8 C) 98.1 F (36.7 C)  98.7 F (37.1 C)  TempSrc: Oral Oral  Oral  Resp: 22 22  106  Height:      Weight:  132.042 kg (291 lb 1.6 oz)    SpO2: 93% 95%  93%    Intake/Output Summary (Last 24 hours) at 01/30/15 0816 Last data filed at 01/30/15 0600  Gross per 24 hour  Intake   1461 ml  Output   2875 ml  Net  -1414 ml   Filed Weights   01/28/15 0623 01/29/15 0515 01/30/15 0500  Weight: 127.234 kg (280 lb 8 oz) 128.731 kg (283 lb 12.8 oz) 132.042 kg (291 lb 1.6 oz)    PHYSICAL EXAM  General: Pleasant, NAD. Neuro: Alert and oriented X 3. Moves all extremities spontaneously. Psych: Normal affect. HEENT:  Normal  Neck: Supple without bruits or JVD. Lungs:  Resp regular and unlabored, CTA. Heart: Irrieg irreg no s3,  s4, or murmurs. Abdomen: Soft, non-tender, non-distended, BS + x 4. Colostomy in place. Perc drain in left side. Extremities: No clubbing, cyanosis or edema. DP/PT/Radials 2+ and equal bilaterally.  Accessory Clinical Findings  CBC  Recent Labs  01/29/15 0420 01/30/15 0230  WBC 18.3* 9.4  HGB 12.6* 12.8*  HCT 38.7* 39.6  MCV 97.5 95.2  PLT 383 0000000   Basic Metabolic Panel  Recent Labs  01/29/15 0420 01/30/15 0230  NA 140 139  K 3.7 3.4*  CL 87* 88*  CO2 43* 43*  GLUCOSE 171* 145*  BUN 23* 21*  CREATININE 0.97 0.96  CALCIUM 8.5* 8.0*     TELE  afib with controlled VR . HR in 90s  Radiology/Studies  Ct Abdomen Pelvis Wo Contrast  01/17/2015  CLINICAL DATA:  Perforated bowel.  Followup. EXAM: CT ABDOMEN AND PELVIS WITHOUT CONTRAST TECHNIQUE: Multidetector CT imaging of the abdomen and pelvis was performed following the standard protocol without IV contrast. COMPARISON:  01/14/2015 FINDINGS: Lower chest: Increased left pleural effusion and left basilar consolidation. Increased right lower lobe atelectasis. Heart is mildly enlarged. Upper abdomen: Significant free intraperitoneal air is now  noted. Gas now tracts within the mesentery and extends into the upper abdomen within the retroperitoneum. Small low-attenuation lesion within the posterior aspect of the liver, stable in appearance a not completely characterized measuring 1.0 cm. No focal abnormality within the spleen, pancreas, or kidneys. The adrenal glands are prominent bilaterally without focal mass. Gallbladder is present. Gastrointestinal tract: The colon has a thickened wall beginning at the proximal descending colon, extending through the upper sigmoid colon. There are scattered diverticula. Significant gas is identified surrounding the descending and sigmoid colon, increased since prior study. The adjacent small bowel loops in the left lower quadrant have significant thickening of the wall. Pelvis: There is a new  fluid collection in the pelvis measuring 5.5 x 6.4 cm. This is a amorphous and I doubt this has become an abscess. There is fluid within the sigmoid mesentery but this does not appear to be consolidated at this time. Retroperitoneum: No evidence for aortic aneurysm. No retroperitoneal or mesenteric adenopathy. Abdominal wall: Unremarkable. Osseous structures: Unremarkable. IMPRESSION: 1. Interval development of significant amount of free intraperitoneal air. 2. Gas and fluid within the sigmoid mesenteric have increased. 3. Fluid collection in the pelvis 6.4 cm without evidence for abscess. 4. Increased left pleural effusion, left basilar consolidation, and right lower lobe atelectasis. 5. Prominent adrenal glands bilaterally, stable in appearance. 6. Critical Value/emergent results were discussed in person at the time of interpretation on 01/17/2015 at 1:30 pm to Dr. Liberty Handy. Electronically Signed   By: Nolon Nations M.D.   On: 01/17/2015 13:54   Dg Chest 2 View  01/29/2015  CLINICAL DATA:  Congestive heart failure EXAM: CHEST  2 VIEW COMPARISON:  01/27/2015 FINDINGS: Cardiomegaly again noted. Persistent mild congestion/ pulmonary edema. Bilateral small pleural effusion with bilateral basilar atelectasis or infiltrate. IMPRESSION: Persistent mild congestion/ pulmonary edema. Bilateral small pleural effusion with bilateral basilar atelectasis or infiltrate. Electronically Signed   By: Lahoma Crocker M.D.   On: 01/29/2015 18:41   Ct Chest W Contrast  01/28/2015  CLINICAL DATA:  Abscess.  Perforated diverticulitis. EXAM: CT CHEST, ABDOMEN, AND PELVIS WITH CONTRAST TECHNIQUE: Multidetector CT imaging of the chest, abdomen and pelvis was performed following the standard protocol during bolus administration of intravenous contrast. CONTRAST:  11mL OMNIPAQUE IOHEXOL 300 MG/ML  SOLN COMPARISON:  01/17/2015 FINDINGS: CT CHEST Mediastinum: The heart size is mildly enlarged. No pericardial effusion identified. The  trachea appears patent and is midline. Unremarkable appearance of the esophagus. Multiple small lymph nodes are noted within the mediastinum. Likely reactive. No adenopathy identified. No supraclavicular or axillary adenopathy. Lungs/Pleura: There is a moderate volume left pleural effusion. A small loculated right pleural effusion is identified, image 85 of series 5. Atelectasis is noted within the right middle lobe and right lower lobe. There is airspace consolidation involving the left lower lobe. Musculoskeletal: No aggressive lytic or sclerotic bone lesions are identified. CT ABDOMEN AND PELVIS Hepatobiliary: There is a low density structure within the posterior right lobe of liver measuring 11 mm, image 52 of series 2. This is unchanged from previous exam. The gallbladder is normal. No biliary dilatation. Pancreas: Negative Spleen: Negative Adrenals/Urinary Tract: The normal appearance of the adrenal glands. The kidneys are unremarkable. Urinary bladder is negative. Stomach/Bowel: Stomach has a normal caliber. There is no abnormal pathologic dilatation of the small bowel loops. The appendix is visualized and appears normal. The proximal scratch set there is a right sided colostomy. The proximal large bowel loops have a normal caliber. There is been dehiscence  of the Hartman's pouch along the suture line. There is an associated large complex fluid collection involving the left side of abdomen measuring 13.5 x 8.3 by 30.9 cm. This extends into the upper abdomen an abuts the greater curvature of the stomach. There are multiple smaller fluid collections scratch set there are smaller collections of gas and fluid identified elsewhere within the abdomen particularly along the left pericolic gutter and retroperitoneum and extending over the spleen. No significant fluid collections identified within the right side of abdomen. Vascular/Lymphatic: Normal appearance of the abdominal aorta. No enlarged retroperitoneal or  mesenteric adenopathy. No enlarged pelvic or inguinal lymph nodes. Reproductive: The prostate gland and seminal vesicles appear normal. Other: Multiple fluid collections secondary to dehiscence of Hartman's pouch identified. Musculoskeletal: No aggressive lytic or sclerotic bone lesions. IMPRESSION: 1. Examination is significant for dehiscence of the Hartman's pouch along the suture line. There is a resultant large complex fluid collection involving much of the left hemi abdomen. Multiple smaller gas and fluid collections are identified scattered throughout the left abdomen especially within the left pericolic gutter and retroperitoneal space posterior to the left kidney. 2. Partially loculated right pleural effusion. 3. Moderate left effusion with overlying airspace consolidation. Critical Value/emergent results were called by telephone at the time of interpretation on 01/28/2015 at 1:41 pm to Dr. Hulen Luster, who verbally acknowledged these results. Electronically Signed   By: Kerby Moors M.D.   On: 01/28/2015 13:41   Ct Abdomen Pelvis W Contrast  01/28/2015  CLINICAL DATA:  Abscess.  Perforated diverticulitis. EXAM: CT CHEST, ABDOMEN, AND PELVIS WITH CONTRAST TECHNIQUE: Multidetector CT imaging of the chest, abdomen and pelvis was performed following the standard protocol during bolus administration of intravenous contrast. CONTRAST:  18mL OMNIPAQUE IOHEXOL 300 MG/ML  SOLN COMPARISON:  01/17/2015 FINDINGS: CT CHEST Mediastinum: The heart size is mildly enlarged. No pericardial effusion identified. The trachea appears patent and is midline. Unremarkable appearance of the esophagus. Multiple small lymph nodes are noted within the mediastinum. Likely reactive. No adenopathy identified. No supraclavicular or axillary adenopathy. Lungs/Pleura: There is a moderate volume left pleural effusion. A small loculated right pleural effusion is identified, image 85 of series 5. Atelectasis is noted within the right middle  lobe and right lower lobe. There is airspace consolidation involving the left lower lobe. Musculoskeletal: No aggressive lytic or sclerotic bone lesions are identified. CT ABDOMEN AND PELVIS Hepatobiliary: There is a low density structure within the posterior right lobe of liver measuring 11 mm, image 52 of series 2. This is unchanged from previous exam. The gallbladder is normal. No biliary dilatation. Pancreas: Negative Spleen: Negative Adrenals/Urinary Tract: The normal appearance of the adrenal glands. The kidneys are unremarkable. Urinary bladder is negative. Stomach/Bowel: Stomach has a normal caliber. There is no abnormal pathologic dilatation of the small bowel loops. The appendix is visualized and appears normal. The proximal scratch set there is a right sided colostomy. The proximal large bowel loops have a normal caliber. There is been dehiscence of the Hartman's pouch along the suture line. There is an associated large complex fluid collection involving the left side of abdomen measuring 13.5 x 8.3 by 30.9 cm. This extends into the upper abdomen an abuts the greater curvature of the stomach. There are multiple smaller fluid collections scratch set there are smaller collections of gas and fluid identified elsewhere within the abdomen particularly along the left pericolic gutter and retroperitoneum and extending over the spleen. No significant fluid collections identified within the right side of abdomen.  Vascular/Lymphatic: Normal appearance of the abdominal aorta. No enlarged retroperitoneal or mesenteric adenopathy. No enlarged pelvic or inguinal lymph nodes. Reproductive: The prostate gland and seminal vesicles appear normal. Other: Multiple fluid collections secondary to dehiscence of Hartman's pouch identified. Musculoskeletal: No aggressive lytic or sclerotic bone lesions. IMPRESSION: 1. Examination is significant for dehiscence of the Hartman's pouch along the suture line. There is a resultant  large complex fluid collection involving much of the left hemi abdomen. Multiple smaller gas and fluid collections are identified scattered throughout the left abdomen especially within the left pericolic gutter and retroperitoneal space posterior to the left kidney. 2. Partially loculated right pleural effusion. 3. Moderate left effusion with overlying airspace consolidation. Critical Value/emergent results were called by telephone at the time of interpretation on 01/28/2015 at 1:41 pm to Dr. Hulen Luster, who verbally acknowledged these results. Electronically Signed   By: Kerby Moors M.D.   On: 01/28/2015 13:41   Dg Chest Port 1 View  01/27/2015  CLINICAL DATA:  Respiratory distress. EXAM: PORTABLE CHEST 1 VIEW COMPARISON:  January 26, 2015. FINDINGS: Stable cardiomegaly and central pulmonary vascular congestion is noted. Bilateral perihilar and basilar interstitial densities are noted concerning for edema. Stable bilateral pleural effusions are noted, with left greater than right. No pneumothorax is noted. IMPRESSION: Stable bilateral opacities most consistent with edema with associated pleural effusions. Electronically Signed   By: Marijo Conception, M.D.   On: 01/27/2015 21:19   Dg Chest Port 1 View  01/26/2015  CLINICAL DATA:  Pulmonary edema and shortness of breath EXAM: PORTABLE CHEST 1 VIEW COMPARISON:  Yesterday FINDINGS: Unchanged hazy appearance of the bilateral chest, left more than right, compatible with effusions. The left effusion is likely moderate volume. There is underlying symmetric interstitial and airspace opacity. Chronic cardiomegaly and stable vascular pedicle widening, accentuated by rightward rotation. No pneumothorax. IMPRESSION: Unchanged CHF pattern including left more than right pleural effusion. Electronically Signed   By: Monte Fantasia M.D.   On: 01/26/2015 11:29   Dg Chest Port 1 View  01/25/2015  CLINICAL DATA:  Acute onset of shortness of breath. EXAM: PORTABLE CHEST 1 VIEW  COMPARISON:  Chest radiograph performed 01/23/2015 FINDINGS: Bilateral central airspace opacification is noted, with underlying vascular congestion and small to moderate bilateral pleural effusions, compatible with pulmonary edema. This appears worsened from the prior study. No pneumothorax is seen. The cardiomediastinal silhouette is enlarged. No acute osseous abnormalities are identified. IMPRESSION: Worsening pulmonary edema, with small to moderate bilateral pleural effusions. Underlying vascular congestion and cardiomegaly. Electronically Signed   By: Garald Balding M.D.   On: 01/25/2015 06:01   Dg Chest Port 1 View  01/23/2015  CLINICAL DATA:  Status post exploratory laparotomy and subsequent colectomy and colostomy placement for perforated bowel secondary to diverticulitis; hypoxia, atrial fibrillation, acute and chronic CHF. EXAM: PORTABLE CHEST 1 VIEW COMPARISON:  Portable chest x-ray of January 21, 2015 FINDINGS: The lungs are mildly hypoinflated. The interstitial markings on the left have improved. The hemidiaphragms remain it obscured. The cardiac silhouette remains enlarged. The pulmonary vascularity is slightly less engorged centrally. The observed bony thorax is unremarkable. IMPRESSION: Interval improvement in the pulmonary interstitium consistent with decreasing interstitial edema. There is persistent cardiomegaly, pulmonary interstitial edema, and small bilateral pleural effusions. Electronically Signed   By: David  Martinique M.D.   On: 01/23/2015 07:41   Dg Chest Port 1 View  01/22/2015  CLINICAL DATA:  Dyspnea. EXAM: PORTABLE CHEST 1 VIEW COMPARISON:  01/19/2015 FINDINGS: Enteric tube is been  removed. Tip of the right central line in the region of the proximal SVC. Low lung volumes again seen. Progressive bibasilar opacities, likely combination of pleural effusion and consolidation. Heart appears prominent in size. Vascular congestion suspected, not well assessed due to low lung volumes.  IMPRESSION: Hypoventilatory chest with progressive bibasilar opacities, likely combination of pleural effusion and consolidation. Consolidation may be related to atelectasis, aspiration, or pneumonia. Electronically Signed   By: Jeb Levering M.D.   On: 01/22/2015 00:02   Dg Chest Port 1 View  01/19/2015  CLINICAL DATA:  50 year old male with respiratory distress shortness of breath. EXAM: PORTABLE CHEST 1 VIEW COMPARISON:  Chest x-ray 01/17/2015. FINDINGS: Patient has been extubated. A nasogastric tube is seen extending into the stomach, however, the tip of the nasogastric tube extends below the lower margin of the image. There is a right-sided internal jugular central venous catheter with tip terminating in the proximal superior vena cava. Lung volumes are slightly low. Widespread patchy asymmetrically distributed interstitial and airspace opacities throughout the lungs bilaterally, with relative sparing of the right upper lobe. Overall aeration is very similar to the prior study. Small right and moderate left pleural effusions. Pulmonary vasculature does not appear engorged. Heart size is mildly enlarged. Upper mediastinal contours are within normal limits. IMPRESSION: 1. Support apparatus, as above. 2. Patchy asymmetrically distributed interstitial and airspace opacities in the lungs bilaterally favored to reflect a multilobar pneumonia. 3. Mild cardiomegaly. Electronically Signed   By: Vinnie Langton M.D.   On: 01/19/2015 07:26   Dg Chest Port 1 View  01/17/2015  CLINICAL DATA:  Central line placement.  Initial encounter. EXAM: PORTABLE CHEST 1 VIEW COMPARISON:  Chest radiograph performed earlier today at 10:49 a.m. FINDINGS: The patient's endotracheal tube is seen ending 6 cm above the carina. The enteric tube is noted extending below the diaphragm. A right IJ line is noted ending about the proximal SVC. The lungs are well-aerated. Small bilateral pleural effusions noted. Vascular congestion is  seen, with bilateral airspace opacification, left greater than right. This raises concern for mildly worsening asymmetric pulmonary edema. There is no evidence of pneumothorax. The cardiomediastinal silhouette is enlarged. No acute osseous abnormalities are seen. IMPRESSION: 1. Endotracheal tube seen ending 6 cm above the carina. 2. Right IJ line noted ending about the proximal SVC. 3. Small bilateral pleural effusions noted. Vascular congestion and cardiomegaly, with bilateral airspace opacification, left greater than right. This raises concern for mildly worsening asymmetric pulmonary edema. Electronically Signed   By: Garald Balding M.D.   On: 01/17/2015 21:41   Dg Chest Port 1 View  01/17/2015  CLINICAL DATA:  Dyspnea, atrial fibrillation, chronic heart failure and cardiomyopathy. EXAM: PORTABLE CHEST 1 VIEW COMPARISON:  01/15/2015 FINDINGS: Degree of pulmonary edema is mildly improved with moderate interstitial edema remaining. Component of left pleural fluid appears less prominent. Heart size also appears less prominent on the portable x-ray. IMPRESSION: Mildly improving pulmonary edema and diminished prominence of left pleural fluid. The heart size also appears less prominent by x-ray. Electronically Signed   By: Aletta Edouard M.D.   On: 01/17/2015 11:20   Dg Chest Port 1 View  01/15/2015  CLINICAL DATA:  Hypoxia. EXAM: PORTABLE CHEST 1 VIEW COMPARISON:  November 30, 2011. FINDINGS: Stable cardiomegaly is noted. Central pulmonary vascular congestion is noted with mild bilateral perihilar and basilar interstitial densities concerning for possible edema. No pneumothorax is noted. Probable mild left pleural effusion is noted. Bony thorax is unremarkable. IMPRESSION: Cardiomegaly and central pulmonary  vascular congestion is noted with probable mild bilateral perihilar and basilar pulmonary edema. Probable mild left pleural effusion is noted. Electronically Signed   By: Marijo Conception, M.D.   On:  01/15/2015 10:16   Ct Renal Stone Study  01/14/2015  CLINICAL DATA:  Bilateral groin pain for 5 days.  Hematuria. EXAM: CT ABDOMEN AND PELVIS WITHOUT CONTRAST TECHNIQUE: Multidetector CT imaging of the abdomen and pelvis was performed following the standard protocol without IV contrast. COMPARISON:  None. FINDINGS: Small left pleural effusion. Left lower lobe atelectasis or infiltrate. Cannot exclude pneumonia. Right lung bases clear. Heart is upper limits normal in size. Liver, spleen, pancreas, adrenals and kidneys have an unremarkable unenhanced appearance. Gallbladder unremarkable. There is extensive pelvic and left retroperitoneal gas. Inflammatory changes in the left retroperitoneum and surrounding the descending colon and sigmoid colon. The gas appears to communicate with a diverticulum in the proximal sigmoid colon there is an area of wall thickening. Findings most compatible with perforated diverticulitis. Trace free fluid in the cul-de-sac. Stomach and small bowel and appendix are normal. Small umbilical hernia containing fat. Small bilateral inguinal hernias containing fat. No acute bony abnormality. IMPRESSION: Extensive gas throughout the left retroperitoneum with extensive stranding around the descending colon and sigmoid colon. This appears to be related to perforated diverticulitis in the proximal sigmoid colon. Small left pleural effusion. Left lower lobe atelectasis or infiltrate. Cannot exclude pneumonia. Electronically Signed   By: Rolm Baptise M.D.   On: 01/14/2015 12:10   Ct Image Guided Drainage By Percutaneous Catheter  01/29/2015  CLINICAL DATA:  50 year old male with a history of perioperative abscess. EXAM: CT GUIDED DRAINAGE OF ABDOMINAL ABSCESS ANESTHESIA/SEDATION: 1.5 Mg IV Versed 100 mcg IV Fentanyl Total Moderate Sedation Time:  15 minutes PROCEDURE: The procedure, risks, benefits, and alternatives were explained to the patient. Questions regarding the procedure were encouraged  and answered. The patient understands and consents to the procedure. The LEFT LOWER ABDOMINAL WALL was prepped with CHLORHEXIDINEin a sterile fashion, and a sterile drape was applied covering the operative field. A sterile gown and sterile gloves were used for the procedure. Local anesthesia was provided with 1% Lidocaine. Once the patient was prepped and draped in the sterile fashion, the skin and subcutaneous tissues were generously infiltrated with 1% lidocaine for local anesthesia. A small stab incision was made with 11 blade scalpel, a Yueh needle was advanced under CT guidance into the gas and fluid collection of the left lower abdomen. Using modified Seldinger technique, a 14 French drain was placed with serial dilation with 12 [place] and 14 [place] dilators. Pigtail catheter was formed, and aspiration of approximately were 1000 cc of feculent fluid was aspirated. Sample was sent to the lab. Catheter was sutured in position attached to gravity drainage. Patient tolerated the procedure well and remained hemodynamically stable throughout. No complications encountered and no significant blood loss encountered. COMPLICATIONS: None FINDINGS: CT demonstrates gas and fluid collection within the left lower quadrant. Decreased size status post drainage of approximately 1 L of fluid. No complicating features. IMPRESSION: Status post CT-guided drain placement into left abdominal abscess. Approximately 1000 cc of feculent fluid aspirated. Sample was sent to the lab for analysis. Signed, Dulcy Fanny. Earleen Newport, DO [facility] Electronically Signed   By: Corrie Mckusick D.O.   On: 01/29/2015 16:25    ASSESSMENT AND PLAN  KYAN REPKE is a 50 y.o. male with a past medical history significant for longstanding persistent atrial fibrillation,  previous tachycardia induced cardiomyopathy, depression, and obesity who presented on 01/14/15 for abdominal pain. He was found  to have perforated sigmoid colon likely diverticulitis s/p left colectomy end colostomy on 01/17/15. She has had issues with afib with RVR and cardiology consulted.   Atrial fibrillation/flutter with RVR: Rate was difficult to control secondary to stress of surgery and CHF.  Failed prior attempts at DCCV/rhythm control. Severe LAE and morbid obesity indicate he is a poor candidate for ablation. Will continue focus on rate control. On metoprolol150 mg bid, digoxin 0.125 mg daily and diltiazem 90 mg tid with much better control currently. On coumadin for anticoagulation- managed by pharmacy, This was held for placement of perc  Drain. INR now 1.22. On IV heparin until INR therapeutic.  Acute on chronic combined systolic/diastolic CHF: EF [id number]. Exacerbated by rapid heart rate and fluid shifts from surgery. Excellent diuresis.I/O negative 13.9 liters. Will reduce oral lasix to 20 mg daily. CXR still shows bilateral effusions possibly related to intra-abdominal process/inflammation.  OSA Encourage BIPAP at night.  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 WBC now normal.    Signed,   Chelbi Herber [place], [place] 01/30/2015 8:16 AM

## 2015-01-30 NOTE — Progress Notes (Signed)
13 Days Post-Op  Subjective: Pt doing well today s/p IR Drain placement Tol Reg Po  Objective: Vital signs in last 24 hours: Temp:  [98.1 F (36.7 C)-98.7 F (37.1 C)] 98.7 F (37.1 C) (01/13 0758) Pulse Rate:  [73-110] 73 (01/13 0758) Resp:  [20-106] 106 (01/13 0758) BP: (86-123)/(36-77) 107/64 mmHg (01/13 0758) SpO2:  [86 %-95 %] 93 % (01/13 0758) Weight:  [132.042 kg (291 lb 1.6 oz)] 132.042 kg (291 lb 1.6 oz) (01/13 0500) Last BM Date: 01/29/15  Intake/Output from previous day: 01/12 0701 - 01/13 0700 In: [id number] [P.O.:900; I.V.:171; IV Piggyback:150] Out: 2875 [Urine:1700; Drains:1125; Stool:50] Intake/Output this shift:    General appearance: alert and cooperative GI: soft, nttp, nd , ostomy patent, vac in place  Lab Results:   Recent Labs  01/29/15 0420 01/30/15 0230  WBC 18.3* 9.4  HGB 12.6* 12.8*  HCT 38.7* 39.6  PLT 383 349   BMET  Recent Labs  01/29/15 0420 01/30/15 0230  NA 140 139  K 3.7 3.4*  CL 87* 88*  CO2 43* 43*  GLUCOSE 171* 145*  BUN 23* 21*  CREATININE 0.97 0.96  CALCIUM 8.5* 8.0*   PT/INR  Recent Labs  01/29/15 0420 01/30/15 0230  LABPROT 16.7* 15.6*  INR 1.34 1.22   ABG  Recent Labs  01/27/15 1340  PHART 7.446  HCO3 45.7*    Studies/Results: Dg Chest 2 View  01/29/2015  CLINICAL DATA:  Congestive heart failure EXAM: CHEST  2 VIEW COMPARISON:  01/27/2015 FINDINGS: Cardiomegaly again noted. Persistent mild congestion/ pulmonary edema. Bilateral small pleural effusion with bilateral basilar atelectasis or infiltrate. IMPRESSION: Persistent mild congestion/ pulmonary edema. Bilateral small pleural effusion with bilateral basilar atelectasis or infiltrate. Electronically Signed   By: Lahoma Crocker M.D.   On: 01/29/2015 18:41   Ct Chest W Contrast  01/28/2015  CLINICAL DATA:  Abscess.  Perforated diverticulitis. EXAM: CT CHEST, ABDOMEN, AND PELVIS WITH CONTRAST TECHNIQUE: Multidetector CT imaging of the chest, abdomen and pelvis  was performed following the standard protocol during bolus administration of intravenous contrast. CONTRAST:  161mL OMNIPAQUE IOHEXOL 300 MG/ML  SOLN COMPARISON:  01/17/2015 FINDINGS: CT CHEST Mediastinum: The heart size is mildly enlarged. No pericardial effusion identified. The trachea appears patent and is midline. Unremarkable appearance of the esophagus. Multiple small lymph nodes are noted within the mediastinum. Likely reactive. No adenopathy identified. No supraclavicular or axillary adenopathy. Lungs/Pleura: There is a moderate volume left pleural effusion. A small loculated right pleural effusion is identified, image 85 of series 5. Atelectasis is noted within the right middle lobe and right lower lobe. There is airspace consolidation involving the left lower lobe. Musculoskeletal: No aggressive lytic or sclerotic bone lesions are identified. CT ABDOMEN AND PELVIS Hepatobiliary: There is a low density structure within the posterior right lobe of liver measuring 11 mm, image 52 of series 2. This is unchanged from previous exam. The gallbladder is normal. No biliary dilatation. Pancreas: Negative Spleen: Negative Adrenals/Urinary Tract: The normal appearance of the adrenal glands. The kidneys are unremarkable. Urinary bladder is negative. Stomach/Bowel: Stomach has a normal caliber. There is no abnormal pathologic dilatation of the small bowel loops. The appendix is visualized and appears normal. The proximal scratch set there is a right sided colostomy. The proximal large bowel loops have a normal caliber. There is been dehiscence of the Hartman's pouch along the suture line. There is an associated large complex fluid collection involving the left side of abdomen measuring 13.5 x 8.3 by 30.9  cm. This extends into the upper abdomen an abuts the greater curvature of the stomach. There are multiple smaller fluid collections scratch set there are smaller collections of gas and fluid identified elsewhere within  the abdomen particularly along the left pericolic gutter and retroperitoneum and extending over the spleen. No significant fluid collections identified within the right side of abdomen. Vascular/Lymphatic: Normal appearance of the abdominal aorta. No enlarged retroperitoneal or mesenteric adenopathy. No enlarged pelvic or inguinal lymph nodes. Reproductive: The prostate gland and seminal vesicles appear normal. Other: Multiple fluid collections secondary to dehiscence of Hartman's pouch identified. Musculoskeletal: No aggressive lytic or sclerotic bone lesions. IMPRESSION: 1. Examination is significant for dehiscence of the Hartman's pouch along the suture line. There is a resultant large complex fluid collection involving much of the left hemi abdomen. Multiple smaller gas and fluid collections are identified scattered throughout the left abdomen especially within the left pericolic gutter and retroperitoneal space posterior to the left kidney. 2. Partially loculated right pleural effusion. 3. Moderate left effusion with overlying airspace consolidation. Critical Value/emergent results were called by telephone at the time of interpretation on 01/28/2015 at 1:41 pm to Dr. Hulen Luster, who verbally acknowledged these results. Electronically Signed   By: Kerby Moors M.D.   On: 01/28/2015 13:41   Ct Abdomen Pelvis W Contrast  01/28/2015  CLINICAL DATA:  Abscess.  Perforated diverticulitis. EXAM: CT CHEST, ABDOMEN, AND PELVIS WITH CONTRAST TECHNIQUE: Multidetector CT imaging of the chest, abdomen and pelvis was performed following the standard protocol during bolus administration of intravenous contrast. CONTRAST:  161mL OMNIPAQUE IOHEXOL 300 MG/ML  SOLN COMPARISON:  01/17/2015 FINDINGS: CT CHEST Mediastinum: The heart size is mildly enlarged. No pericardial effusion identified. The trachea appears patent and is midline. Unremarkable appearance of the esophagus. Multiple small lymph nodes are noted within the  mediastinum. Likely reactive. No adenopathy identified. No supraclavicular or axillary adenopathy. Lungs/Pleura: There is a moderate volume left pleural effusion. A small loculated right pleural effusion is identified, image 85 of series 5. Atelectasis is noted within the right middle lobe and right lower lobe. There is airspace consolidation involving the left lower lobe. Musculoskeletal: No aggressive lytic or sclerotic bone lesions are identified. CT ABDOMEN AND PELVIS Hepatobiliary: There is a low density structure within the posterior right lobe of liver measuring 11 mm, image 52 of series 2. This is unchanged from previous exam. The gallbladder is normal. No biliary dilatation. Pancreas: Negative Spleen: Negative Adrenals/Urinary Tract: The normal appearance of the adrenal glands. The kidneys are unremarkable. Urinary bladder is negative. Stomach/Bowel: Stomach has a normal caliber. There is no abnormal pathologic dilatation of the small bowel loops. The appendix is visualized and appears normal. The proximal scratch set there is a right sided colostomy. The proximal large bowel loops have a normal caliber. There is been dehiscence of the Hartman's pouch along the suture line. There is an associated large complex fluid collection involving the left side of abdomen measuring 13.5 x 8.3 by 30.9 cm. This extends into the upper abdomen an abuts the greater curvature of the stomach. There are multiple smaller fluid collections scratch set there are smaller collections of gas and fluid identified elsewhere within the abdomen particularly along the left pericolic gutter and retroperitoneum and extending over the spleen. No significant fluid collections identified within the right side of abdomen. Vascular/Lymphatic: Normal appearance of the abdominal aorta. No enlarged retroperitoneal or mesenteric adenopathy. No enlarged pelvic or inguinal lymph nodes. Reproductive: The prostate gland and seminal vesicles appear  normal. Other: Multiple fluid collections secondary to dehiscence of Hartman's pouch identified. Musculoskeletal: No aggressive lytic or sclerotic bone lesions. IMPRESSION: 1. Examination is significant for dehiscence of the Hartman's pouch along the suture line. There is a resultant large complex fluid collection involving much of the left hemi abdomen. Multiple smaller gas and fluid collections are identified scattered throughout the left abdomen especially within the left pericolic gutter and retroperitoneal space posterior to the left kidney. 2. Partially loculated right pleural effusion. 3. Moderate left effusion with overlying airspace consolidation. Critical Value/emergent results were called by telephone at the time of interpretation on 01/28/2015 at 1:41 pm to Dr. Hulen Luster, who verbally acknowledged these results. Electronically Signed   By: Kerby Moors M.D.   On: 01/28/2015 13:41   Ct Image Guided Drainage By Percutaneous Catheter  01/29/2015  CLINICAL DATA:  50 year old male with a history of perioperative abscess. EXAM: CT GUIDED DRAINAGE OF ABDOMINAL ABSCESS ANESTHESIA/SEDATION: 1.5 Mg IV Versed 100 mcg IV Fentanyl Total Moderate Sedation Time:  15 minutes PROCEDURE: The procedure, risks, benefits, and alternatives were explained to the patient. Questions regarding the procedure were encouraged and answered. The patient understands and consents to the procedure. The LEFT LOWER ABDOMINAL WALL was prepped with CHLORHEXIDINEin a sterile fashion, and a sterile drape was applied covering the operative field. A sterile gown and sterile gloves were used for the procedure. Local anesthesia was provided with 1% Lidocaine. Once the patient was prepped and draped in the sterile fashion, the skin and subcutaneous tissues were generously infiltrated with 1% lidocaine for local anesthesia. A small stab incision was made with 11 blade scalpel, a Yueh needle was advanced under CT guidance into the gas and fluid  collection of the left lower abdomen. Using modified Seldinger technique, a 14 French drain was placed with serial dilation with 12 [place] and 14 [place] dilators. Pigtail catheter was formed, and aspiration of approximately were 1000 cc of feculent fluid was aspirated. Sample was sent to the lab. Catheter was sutured in position attached to gravity drainage. Patient tolerated the procedure well and remained hemodynamically stable throughout. No complications encountered and no significant blood loss encountered. COMPLICATIONS: None FINDINGS: CT demonstrates gas and fluid collection within the left lower quadrant. Decreased size status post drainage of approximately 1 L of fluid. No complicating features. IMPRESSION: Status post CT-guided drain placement into left abdominal abscess. Approximately 1000 cc of feculent fluid aspirated. Sample was sent to the lab for analysis. Signed, Dulcy Fanny. Earleen Newport, DO [facility] Electronically Signed   By: Corrie Mckusick D.O.   On: 01/29/2015 16:25    Anti-infectives: Anti-infectives    Start     Dose/Rate Route Frequency Ordered Stop   01/28/15 1500  piperacillin-tazobactam (ZOSYN) IVPB 3.375 g     3.375 g 12.5 mL/hr over 240 Minutes Intravenous 3 times per day 01/28/15 1409     01/18/15 0400  levofloxacin (LEVAQUIN) IVPB 750 mg  Status:  Discontinued     750 mg 100 mL/hr over 90 Minutes Intravenous Every 24 hours 01/18/15 0210 01/21/15 1108   01/18/15 0215  meropenem (MERREM) 1 g in sodium chloride 0.9 % 100 mL IVPB  Status:  Discontinued     1 g 200 mL/hr over 30 Minutes Intravenous 3 times per day 01/18/15 0210 01/26/15 0822   01/16/15 0930  ertapenem (INVANZ) 1 g in sodium chloride 0.9 % 50 mL IVPB  Status:  Discontinued     1 g 100 mL/hr over  30 Minutes Intravenous Every 24 hours 01/16/15 0841 01/18/15 0203   01/14/15 1245  piperacillin-tazobactam (ZOSYN) IVPB 3.375 g  Status:  Discontinued     3.375  g 12.5 mL/hr over 240 Minutes Intravenous 3 times per day 01/14/15 1238 01/16/15 0841   01/14/15 1230  ciprofloxacin (CIPRO) IVPB 400 mg  Status:  Discontinued     400 mg 200 mL/hr over 60 Minutes Intravenous  Once 01/14/15 1218 01/14/15 1238   01/14/15 1230  metroNIDAZOLE (FLAGYL) IVPB 500 mg  Status:  Discontinued     500 mg 100 mL/hr over 60 Minutes Intravenous  Once 01/14/15 1218 01/14/15 1238      Assessment/Plan: s/p Procedure(s): EXPLORATORY LAPAROTOMY WITH LEFT COLECTOMY AND COLOSTOMY (N/A) Pt will need f/u with IR drain clinic Con't abx, Augmentin, as outpt for total of 14d OK for DC from surgical standpoint, f/u in computer   LOS: 16 days    Rosario Jacks., Anne Hahn 01/30/2015

## 2015-01-30 NOTE — Plan of Care (Signed)
Problem: Pain Management: Goal: Pain level will decrease Outcome: Progressing Pain level at the beginning of the shift was 7-8/10 and now it's about a 2-4/10 and very tolerable. Patient takes his pain meds but doesn't ask much, you have to ask him and I explained that PRN means as needed and that he would have to ask for it and he verbalized understanding.

## 2015-01-30 NOTE — Progress Notes (Signed)
ANTICOAGULATION CONSULT NOTE - Follow Up Consult  Pharmacy Consult for Heparin Indication: atrial fibrillation  Allergies  Allergen Reactions  . Contrast Media [Iodinated Diagnostic Agents] Rash    a diffuse macular rash after CTA chest  Pt was premedicated with 125mg  IV Solumedrol, 50mg  IV Benadryl 1 hr prior to CTexam, and tolerated procedure without any difficulties.    Patient Measurements: Height: 5\' 11"  (180.3 cm) Weight: 291 lb 1.6 oz (132.042 kg) IBW/kg (Calculated) : 75.3 Heparin Dosing Weight: 105 kg  Vital Signs: Temp: 97.7 F (36.5 C) (01/13 1655) Temp Source: Oral (01/13 1655) BP: 109/51 mmHg (01/13 1655) Pulse Rate: 75 (01/13 1655)  Labs:  Recent Labs  01/28/15 0442 01/29/15 0420 01/30/15 0230 01/30/15 0942 01/30/15 1853  HGB 13.5 12.6* 12.8*  --   --   HCT 41.4 38.7* 39.6  --   --   PLT 443* 383 349  --   --   LABPROT 34.1* 16.7* 15.6*  --   --   INR 3.47* 1.34 1.22  --   --   HEPARINUNFRC  --   --  <0.10* <0.10* <0.10*  CREATININE 0.74 0.97 0.96  --   --     Estimated Creatinine Clearance: 131.9 mL/min (by C-G formula based on Cr of 0.96).  Assessment: 50 year old male on IV heparin for atrial fibrillation.    Heparin remains undetectable (<0.1) on 3000 units/hr. Discussed with RN- arm with large knot at site of Heparin entry- appears to be infiltrated. RN will change IV site to other arm. No change to dose and check level in 6 hours.    Previously required 3300 units/hr to achieve target levels.    Goal of Therapy:  Heparin level 0.3-0.7 units/ml Monitor platelets by anticoagulation protocol: Yes   Plan:   Change IV site to opposite arm.   Continue heparin at 3000 units/hr.  Repeat Heparin level ~6hrs after rate change.  Daily heparin level and CBC while on heparin.  Resume Pradaxa when able.  Sloan Leiter, PharmD, BCPS  Clinical Pharmacist [phone]  01/30/2015,7:56 PM

## 2015-01-30 NOTE — Consult Note (Signed)
WOC wound follow-up consult note Reason for Consult: VAC dressing change to midline abdominal wound.  Wound type: post-op full thickness surgical wound  Wound [id number] tissue, 85% red, 15% yellow interspersed throughout Drainage (amount, consistency, odor) mod amt yellow serosanguinous drainage, no odor Periwound: intact  Dressing procedure/placement/frequency: 1 pc of black foam used to fill the wound bed, sealed at 165mmHG cont suction. Pt tolerated well.  [place] team will change Q M/W/F.  WOC ostomy consult note Reason for Consult: Pouch change demonstration performed for patient  Stoma red and viable, 50% above skin level, 50% flush with skin level from 6:00 o'clock to 11:00 o'clock, 1 1/4 inches  Applied barrier ring and 2 piece pouching appliance to maintain seal. Mod amt brown stool in pouch Demonstrated pouch change using barrier ring and 2 piece pouching system. Pt watched and asked appropriate questions. He was able to open and close the velcro. Supplies at the bedside for staff nurse use  Placed on [place] discharge program: Yes Julien Girt MSN, [place], [place], [place], [place]

## 2015-01-30 NOTE — Progress Notes (Addendum)
Inpatient Rehabilitation  Please see consult from today by Dr. Naaman Plummer.  Met with patient at bedside to discuss recommendation for home health therapy.  Patient in agreement with plan.  Phoned sister Annett Gula, [phone] to notify and left voicemail.  IP Rehab signing off at this time.   Spoke with sister and she is upset with plan for discharge home with home health.  She prefers for patient to have rehab.  I have notified RN CM and CSW of sister's wishes.  Requested Marshell Levan, CSW to call her.       Carmelia Roller., CCC/SLP Admission Coordinator  [place]  Cell [phone]

## 2015-01-30 NOTE — Consult Note (Signed)
Physical Medicine and Rehabilitation Consult   Reason for Consult: Debility Referring Physician: Dr. Dareen Piano   HPI: William Fischer is a 50 y.o. male with history of A fib, CM, morbid obesity, chronic systolic CHF who was admitted on 01/14/15 with 5-day history of progressive LLQ pain due to perforated sigmoid diverticulitis. He was started on IV antibiotics as well as IVF and conservative monitoring recommended by [facility]. He developed pneumoperitoneum on 01/17/15 and was taken to OR for left colectomy with end colostomy and found to have ischemic colon.  Wound VAC placed on abdomen with plans for change to wet to dry at discharge.   Cardiology following for management of A fib with RVR with acute decompensation of systolic CHF. He tolerated extubation on 01/18/15 and was started on clears.  He developed tachycardia with increased WOB requiring BIPAP and IV diuresis on 01/10. CT abdomen revealed left sided abscess with dehiscence of rectal pouch. IR consulted for assistance and drains placed on 01/12 with copious drainage of brown purulent material. Therapy ongoing and CIR recommended due to deconditioned state. Patient states that he feels better and is ready to go home.    Review of Systems  HENT: Negative for hearing loss.   Eyes: Negative for blurred vision and double vision.  Respiratory: Negative for sputum production and shortness of breath.   Cardiovascular: Positive for palpitations. Negative for chest pain.  Gastrointestinal: Negative for heartburn, nausea, abdominal pain (gone since drains werre put in!) and constipation.  Genitourinary: Negative for urgency and frequency.  Musculoskeletal: Negative for myalgias, back pain and neck pain.  Neurological: Negative for dizziness, tingling, sensory change, focal weakness and headaches.  Psychiatric/Behavioral: The patient does not have insomnia.       Past Medical History  Diagnosis Date  . Chicken pox as a child  . Migraine  06/29/2011  . Obesity 06/29/2011  . Depression with anxiety 06/29/2011  . Atrial fibrillation ([place])     admx 11/13 with acute sCHF in setting of RVR  => a. failed DCCV x 2; b. Pradaxa started;  c. failed sotalol  . Chronic systolic heart failure ([place])     a. echo 11/13: Ef 40-45%, diff HK, mod MR, mod LAE, mild RVE, mod RAE, small effusion;   b. TEE 11/13:  EF 35-40%, no LAA clot; Echo 2/14 shows normal EF  . Cardiomyopathy ([place])     likely tachy mediated in setting of AF with RVR  . Snoring     patient needs sleep study - has declined  . Allergy to IVP dye   . Chronic anticoagulation     Pradaxa    Past Surgical History  Procedure Laterality Date  . Tee without cardioversion  11/30/2011    Procedure: TRANSESOPHAGEAL ECHOCARDIOGRAM (TEE);  Surgeon: Thayer Headings, MD;  Location: [facility];  Service: Cardiovascular;  Laterality: N/A;  . Cardioversion  11/30/2011    Procedure: CARDIOVERSION;  Surgeon: Thayer Headings, MD;  Location: [place];  Service: Cardiovascular;  Laterality: N/A;  . Cardioversion  12/03/2011    Procedure: CARDIOVERSION;  Surgeon: Jolaine Artist, MD;  Location: [place];  Service: Cardiovascular;  Laterality: N/A;  . Laparotomy N/A 01/17/2015    Procedure: EXPLORATORY LAPAROTOMY WITH LEFT COLECTOMY AND COLOSTOMY;  Surgeon: Rolm Bookbinder, MD;  Location: [facility];  Service: General;  Laterality: N/A;    Family History  Problem Relation Age of Onset  . Hypertension Mother   . Hypertension Father   . Aneurysm  Father   . Alcohol abuse Father   . Cancer Maternal Grandmother     brain  . Diabetes Maternal Grandfather     type 2  . Parkinsonism Maternal Grandfather   . Cancer Paternal Grandmother     breast  . Diabetes Paternal Grandmother   . Alcohol abuse Paternal Grandfather     Social History:  Divorced.  23 years old son lives with him and mother lives next door. Has been unemployed for 8 years. He reports that he has been smoking Cigarettes--2  PPD.  He has a 20 pack-year smoking history. He has never used smokeless tobacco. He reports that he does not drink alcohol or use illicit drugs.   Allergies  Allergen Reactions  . Contrast Media [Iodinated Diagnostic Agents] Rash    a diffuse macular rash after CTA chest  Pt was premedicated with 125mg  IV Solumedrol, 50mg  IV Benadryl 1 hr prior to CTexam, and tolerated procedure without any difficulties.   Medications Prior to Admission  Medication Sig Dispense Refill  . metoprolol (LOPRESSOR) 50 MG tablet TAKE 1 & 1/2 TABLETS (75 MG TOTAL) BY MOUTH 2 (TWO) TIMES DAILY. 270 tablet 1  . PRADAXA 150 MG CAPS capsule TAKE 1 CAPSULE BY MOUTH EVERY 12 HOURS 60 capsule 10  . spironolactone (ALDACTONE) 25 MG tablet TAKE 1 TABLET BY MOUTH EVERY DAY 30 tablet 10  . furosemide (LASIX) 20 MG tablet TAKE 1 TABLET BY MOUTH EVERY DAY 30 tablet 10    Home: Home Living Family/patient expects to be discharged to:: Private residence Living Arrangements: Children (2 sons) Available Help at Discharge: Family (sister is here to assist, she lives in [place]) Type of Home: House Home Access: Stairs to enter Technical brewer of Steps: 3 Entrance Stairs-Rails: Right, Left Home Layout: One level Bathroom Shower/Tub: Gaffer, Door (pt reports he also has a garden tub that he normally sits in for baths) Bathroom Toilet: Handicapped height Home Equipment: Environmental consultant - 2 wheels, [employer] - single point, Bedside commode, Shower seat Additional Comments: Home equipment entered from PT, when pt working with OT he did not mention any equipment  Functional History: Prior Function Level of Independence: Independent Comments: did not need supplemental O2 prior to admission Functional Status:  Mobility: Bed Mobility Overal bed mobility: Needs Assistance Bed Mobility: Supine to Sit Rolling: Supervision Supine to sit: Min guard Sit to supine: Min assist General bed mobility comments: Definite use of bed rails and  increased time to complete.   Transfers Overall transfer level: Needs assistance Equipment used: Rolling walker (2 wheeled) Transfers: Sit to/from Stand, Stand Pivot Transfers Sit to Stand: Min assist Stand pivot transfers: Min guard, +2 safety/equipment General transfer comment: pt needs A for power up to standing and 2 person A for management of O2, Wound vac, IV lines, and monitor.   Ambulation/Gait Ambulation/Gait assistance: Min guard Ambulation Distance (Feet): 10 Feet Assistive device: Rolling walker (2 wheeled) Gait Pattern/deviations: Step-through pattern, Trunk flexed General Gait Details: Cues to self-monitor for activity tolerance; Good use of RW for steadiness, with good maneuvering in small relatively crowded room Gait velocity: slower Gait velocity interpretation: Below normal speed for age/gender    ADL: ADL Overall ADL's : Needs assistance/impaired Eating/Feeding: Set up, Bed level Grooming: Set up, Bed level Upper Body Bathing: Minimal assitance, Sitting Lower Body Bathing: Total assistance, Sit to/from stand Upper Body Dressing : Minimal assistance, Sitting Lower Body Dressing: Total assistance, Sit to/from stand Toileting- Water quality scientist and Hygiene: Total assistance, Sit to/from stand  Tub/Shower Transfer Details (indicate cue type and reason): did not occur Functional mobility during ADLs: Min guard, Cueing for safety, Rolling walker General ADL Comments: Pt requries increased assistance secondary to decreased cardiopulmonary support and body habitus.   Cognition: Cognition Overall Cognitive Status: No family/caregiver present to determine baseline cognitive functioning Orientation Level: Oriented to person, Oriented to place, Oriented to situation, Disoriented to time Cognition Arousal/Alertness: Awake/alert Behavior During Therapy: WFL for tasks assessed/performed, Anxious Overall Cognitive Status: No family/caregiver present to determine baseline  cognitive functioning  Blood pressure 107/57, pulse 61, temperature 98.2 F (36.8 C), temperature source Oral, resp. rate 20, height 5\' 11"  (1.803 m), weight 132.042 kg (291 lb 1.6 oz), SpO2 96 %. Physical Exam  Nursing note and vitals reviewed. Constitutional: He is oriented to person, place, and time. He appears well-developed and well-nourished.  HENT:  Head: Normocephalic and atraumatic.  Eyes: Conjunctivae are normal. Pupils are equal, round, and reactive to light.  Neck: Normal range of motion. Neck supple.  Cardiovascular: Normal rate.   Respiratory: Effort normal and breath sounds normal. No respiratory distress. He has no wheezes.  GI: Soft. There is no tenderness.  Musculoskeletal: He exhibits no edema or tenderness.  Neurological: He is alert and oriented to person, place, and time.  Cognitively intact. Motor 5/5 UE's prox to distal. LE's 4/5 HF to 5/5 KE and ADF/PF. No sensory deficits   Skin: Skin is warm and dry.  Psychiatric: He has a normal mood and affect.    Results for orders placed or performed during the hospital encounter of 01/14/15 (from the past 24 hour(s))  Culture, routine-abscess     Status: None (Preliminary result)   Collection Time: 01/29/15  3:25 PM  Result Value Ref Range   Specimen Description ABSCESS ABDOMEN    Special Requests NONE    Gram Stain      ABUNDANT WBC PRESENT,BOTH PMN AND MONONUCLEAR NO SQUAMOUS EPITHELIAL CELLS SEEN ABUNDANT GRAM POSITIVE COCCI IN PAIRS IN CHAINS IN CLUSTERS ABUNDANT GRAM NEGATIVE RODS FEW GRAM POSITIVE RODS    Culture      Culture reincubated for better growth Performed at [facility]    Report Status PENDING   Glucose, capillary     Status: Abnormal   Collection Time: 01/29/15  5:08 PM  Result Value Ref Range   Glucose-Capillary 173 (H) 65 - 99 mg/dL   Comment 1 Notify RN    Comment 2 Document in Chart   Glucose, capillary     Status: Abnormal   Collection Time: 01/29/15  9:39 PM  Result Value  Ref Range   Glucose-Capillary 197 (H) 65 - 99 mg/dL  CBC     Status: Abnormal   Collection Time: 01/30/15  2:30 AM  Result Value Ref Range   WBC 9.4 4.0 - 10.5 K/uL   RBC 4.16 (L) 4.22 - 5.81 MIL/uL   Hemoglobin 12.8 (L) 13.0 - 17.0 g/dL   HCT 39.6 39.0 - 52.0 %   MCV 95.2 78.0 - 100.0 fL   MCH 30.8 26.0 - 34.0 pg   MCHC 32.3 30.0 - 36.0 g/dL   RDW 15.4 11.5 - 15.5 %   Platelets 349 150 - 400 K/uL  Protime-INR     Status: Abnormal   Collection Time: 01/30/15  2:30 AM  Result Value Ref Range   Prothrombin Time 15.6 (H) 11.6 - 15.2 seconds   INR 1.22 0.00 - 99991111  Basic metabolic panel     Status: Abnormal   Collection  Time: 01/30/15  2:30 AM  Result Value Ref Range   Sodium 139 135 - 145 mmol/L   Potassium 3.4 (L) 3.5 - 5.1 mmol/L   Chloride 88 (L) 101 - 111 mmol/L   CO2 43 (H) 22 - 32 mmol/L   Glucose, Bld 145 (H) 65 - 99 mg/dL   BUN 21 (H) 6 - 20 mg/dL   Creatinine, Ser 0.96 0.61 - 1.24 mg/dL   Calcium 8.0 (L) 8.9 - 10.3 mg/dL   GFR calc non Af Amer >60 >60 mL/min   GFR calc Af Amer >60 >60 mL/min   Anion gap 8 5 - 15  Heparin level (unfractionated)     Status: Abnormal   Collection Time: 01/30/15  2:30 AM  Result Value Ref Range   Heparin Unfractionated <0.10 (L) 0.30 - 0.70 IU/mL  Glucose, capillary     Status: Abnormal   Collection Time: 01/30/15  7:57 AM  Result Value Ref Range   Glucose-Capillary 125 (H) 65 - 99 mg/dL  Heparin level (unfractionated)     Status: Abnormal   Collection Time: 01/30/15  9:42 AM  Result Value Ref Range   Heparin Unfractionated <0.10 (L) 0.30 - 0.70 IU/mL  Glucose, capillary     Status: Abnormal   Collection Time: 01/30/15 12:01 PM  Result Value Ref Range   Glucose-Capillary 189 (H) 65 - 99 mg/dL   Dg Chest 2 View  01/29/2015  CLINICAL DATA:  Congestive heart failure EXAM: CHEST  2 VIEW COMPARISON:  01/27/2015 FINDINGS: Cardiomegaly again noted. Persistent mild congestion/ pulmonary edema. Bilateral small pleural effusion with  bilateral basilar atelectasis or infiltrate. IMPRESSION: Persistent mild congestion/ pulmonary edema. Bilateral small pleural effusion with bilateral basilar atelectasis or infiltrate. Electronically Signed   By: Lahoma Crocker M.D.   On: 01/29/2015 18:41   Ct Chest W Contrast  01/28/2015  CLINICAL DATA:  Abscess.  Perforated diverticulitis. EXAM: CT CHEST, ABDOMEN, AND PELVIS WITH CONTRAST TECHNIQUE: Multidetector CT imaging of the chest, abdomen and pelvis was performed following the standard protocol during bolus administration of intravenous contrast. CONTRAST:  16mL OMNIPAQUE IOHEXOL 300 MG/ML  SOLN COMPARISON:  01/17/2015 FINDINGS: CT CHEST Mediastinum: The heart size is mildly enlarged. No pericardial effusion identified. The trachea appears patent and is midline. Unremarkable appearance of the esophagus. Multiple small lymph nodes are noted within the mediastinum. Likely reactive. No adenopathy identified. No supraclavicular or axillary adenopathy. Lungs/Pleura: There is a moderate volume left pleural effusion. A small loculated right pleural effusion is identified, image 85 of series 5. Atelectasis is noted within the right middle lobe and right lower lobe. There is airspace consolidation involving the left lower lobe. Musculoskeletal: No aggressive lytic or sclerotic bone lesions are identified. CT ABDOMEN AND PELVIS Hepatobiliary: There is a low density structure within the posterior right lobe of liver measuring 11 mm, image 52 of series 2. This is unchanged from previous exam. The gallbladder is normal. No biliary dilatation. Pancreas: Negative Spleen: Negative Adrenals/Urinary Tract: The normal appearance of the adrenal glands. The kidneys are unremarkable. Urinary bladder is negative. Stomach/Bowel: Stomach has a normal caliber. There is no abnormal pathologic dilatation of the small bowel loops. The appendix is visualized and appears normal. The proximal scratch set there is a right sided colostomy.  The proximal large bowel loops have a normal caliber. There is been dehiscence of the Hartman's pouch along the suture line. There is an associated large complex fluid collection involving the left side of abdomen measuring 13.5 x 8.3 by  30.9 cm. This extends into the upper abdomen an abuts the greater curvature of the stomach. There are multiple smaller fluid collections scratch set there are smaller collections of gas and fluid identified elsewhere within the abdomen particularly along the left pericolic gutter and retroperitoneum and extending over the spleen. No significant fluid collections identified within the right side of abdomen. Vascular/Lymphatic: Normal appearance of the abdominal aorta. No enlarged retroperitoneal or mesenteric adenopathy. No enlarged pelvic or inguinal lymph nodes. Reproductive: The prostate gland and seminal vesicles appear normal. Other: Multiple fluid collections secondary to dehiscence of Hartman's pouch identified. Musculoskeletal: No aggressive lytic or sclerotic bone lesions. IMPRESSION: 1. Examination is significant for dehiscence of the Hartman's pouch along the suture line. There is a resultant large complex fluid collection involving much of the left hemi abdomen. Multiple smaller gas and fluid collections are identified scattered throughout the left abdomen especially within the left pericolic gutter and retroperitoneal space posterior to the left kidney. 2. Partially loculated right pleural effusion. 3. Moderate left effusion with overlying airspace consolidation. Critical Value/emergent results were called by telephone at the time of interpretation on 01/28/2015 at 1:41 pm to Dr. Hulen Luster, who verbally acknowledged these results. Electronically Signed   By: Kerby Moors M.D.   On: 01/28/2015 13:41   Ct Abdomen Pelvis W Contrast  01/28/2015  CLINICAL DATA:  Abscess.  Perforated diverticulitis. EXAM: CT CHEST, ABDOMEN, AND PELVIS WITH CONTRAST TECHNIQUE: Multidetector CT  imaging of the chest, abdomen and pelvis was performed following the standard protocol during bolus administration of intravenous contrast. CONTRAST:  142mL OMNIPAQUE IOHEXOL 300 MG/ML  SOLN COMPARISON:  01/17/2015 FINDINGS: CT CHEST Mediastinum: The heart size is mildly enlarged. No pericardial effusion identified. The trachea appears patent and is midline. Unremarkable appearance of the esophagus. Multiple small lymph nodes are noted within the mediastinum. Likely reactive. No adenopathy identified. No supraclavicular or axillary adenopathy. Lungs/Pleura: There is a moderate volume left pleural effusion. A small loculated right pleural effusion is identified, image 85 of series 5. Atelectasis is noted within the right middle lobe and right lower lobe. There is airspace consolidation involving the left lower lobe. Musculoskeletal: No aggressive lytic or sclerotic bone lesions are identified. CT ABDOMEN AND PELVIS Hepatobiliary: There is a low density structure within the posterior right lobe of liver measuring 11 mm, image 52 of series 2. This is unchanged from previous exam. The gallbladder is normal. No biliary dilatation. Pancreas: Negative Spleen: Negative Adrenals/Urinary Tract: The normal appearance of the adrenal glands. The kidneys are unremarkable. Urinary bladder is negative. Stomach/Bowel: Stomach has a normal caliber. There is no abnormal pathologic dilatation of the small bowel loops. The appendix is visualized and appears normal. The proximal scratch set there is a right sided colostomy. The proximal large bowel loops have a normal caliber. There is been dehiscence of the Hartman's pouch along the suture line. There is an associated large complex fluid collection involving the left side of abdomen measuring 13.5 x 8.3 by 30.9 cm. This extends into the upper abdomen an abuts the greater curvature of the stomach. There are multiple smaller fluid collections scratch set there are smaller collections of  gas and fluid identified elsewhere within the abdomen particularly along the left pericolic gutter and retroperitoneum and extending over the spleen. No significant fluid collections identified within the right side of abdomen. Vascular/Lymphatic: Normal appearance of the abdominal aorta. No enlarged retroperitoneal or mesenteric adenopathy. No enlarged pelvic or inguinal lymph nodes. Reproductive: The prostate gland and seminal vesicles  appear normal. Other: Multiple fluid collections secondary to dehiscence of Hartman's pouch identified. Musculoskeletal: No aggressive lytic or sclerotic bone lesions. IMPRESSION: 1. Examination is significant for dehiscence of the Hartman's pouch along the suture line. There is a resultant large complex fluid collection involving much of the left hemi abdomen. Multiple smaller gas and fluid collections are identified scattered throughout the left abdomen especially within the left pericolic gutter and retroperitoneal space posterior to the left kidney. 2. Partially loculated right pleural effusion. 3. Moderate left effusion with overlying airspace consolidation. Critical Value/emergent results were called by telephone at the time of interpretation on 01/28/2015 at 1:41 pm to Dr. Hulen Luster, who verbally acknowledged these results. Electronically Signed   By: Kerby Moors M.D.   On: 01/28/2015 13:41   Ct Image Guided Drainage By Percutaneous Catheter  01/29/2015  CLINICAL DATA:  50 year old male with a history of perioperative abscess. EXAM: CT GUIDED DRAINAGE OF ABDOMINAL ABSCESS ANESTHESIA/SEDATION: 1.5 Mg IV Versed 100 mcg IV Fentanyl Total Moderate Sedation Time:  15 minutes PROCEDURE: The procedure, risks, benefits, and alternatives were explained to the patient. Questions regarding the procedure were encouraged and answered. The patient understands and consents to the procedure. The LEFT LOWER ABDOMINAL WALL was prepped with CHLORHEXIDINEin a sterile fashion, and a sterile  drape was applied covering the operative field. A sterile gown and sterile gloves were used for the procedure. Local anesthesia was provided with 1% Lidocaine. Once the patient was prepped and draped in the sterile fashion, the skin and subcutaneous tissues were generously infiltrated with 1% lidocaine for local anesthesia. A small stab incision was made with 11 blade scalpel, a Yueh needle was advanced under CT guidance into the gas and fluid collection of the left lower abdomen. Using modified Seldinger technique, a 14 French drain was placed with serial dilation with 12 [place] and 14 [place] dilators. Pigtail catheter was formed, and aspiration of approximately were 1000 cc of feculent fluid was aspirated. Sample was sent to the lab. Catheter was sutured in position attached to gravity drainage. Patient tolerated the procedure well and remained hemodynamically stable throughout. No complications encountered and no significant blood loss encountered. COMPLICATIONS: None FINDINGS: CT demonstrates gas and fluid collection within the left lower quadrant. Decreased size status post drainage of approximately 1 L of fluid. No complicating features. IMPRESSION: Status post CT-guided drain placement into left abdominal abscess. Approximately 1000 cc of feculent fluid aspirated. Sample was sent to the lab for analysis. Signed, Dulcy Fanny. Earleen Newport, DO [facility] Electronically Signed   By: Corrie Mckusick D.O.   On: 01/29/2015 16:25    Assessment/Plan: Diagnosis: debility related to diverticulitis/ bowel perforation/multiple medical 1. Does the need for close, 24 hr/day medical supervision in concert with the patient's rehab needs make it unreasonable for this patient to be served in a less intensive setting? Potentially 2. Co-Morbidities requiring supervision/potential complications: chf,  3. Due to bladder management, bowel management, safety, skin/wound care,  disease management, medication administration, pain management and patient education, does the patient require 24 hr/day rehab nursing? Yes 4. Does the patient require coordinated care of a physician, rehab nurse, PT (1-2 hrs/day, 5 days/week) and OT (1-2 hrs/day, 5 days/week) to address physical and functional deficits in the context of the above medical diagnosis(es)? No and Potentially Addressing deficits in the following areas: balance, endurance, locomotion, strength, transferring, bowel/bladder control, bathing, dressing, feeding, grooming and toileting 5. Can the patient actively participate in an intensive therapy program of  at least 3 hrs of therapy per day at least 5 days per week? Yes 6. The potential for patient to make measurable gains while on inpatient rehab is fair 7. Anticipated functional outcomes upon discharge from inpatient rehab are n/a  with PT, n/a with OT, n/a with SLP. 8. Estimated rehab length of stay to reach the above functional goals is: n/a 9. Does the patient have adequate social supports and living environment to accommodate these discharge functional goals? Yes 10. Anticipated D/C setting: Home 11. Anticipated post D/C treatments: [facility] therapy 12. Overall Rehab/Functional Prognosis: excellent  RECOMMENDATIONS: This patient's condition is appropriate for continued rehabilitative care in the following setting: [facility] Therapy Patient has agreed to participate in recommended program. Yes Note that insurance prior authorization may be required for reimbursement for recommended care.  Comment: Pt wants to go home. He is essentially min assist except for tasks affected by his ostomy/wounds. I would expect that when he's medically appropriate for discharge he should be able to manage at home.   Meredith Staggers, MD, [facility] 01/30/2015     01/30/2015

## 2015-01-30 NOTE — Progress Notes (Signed)
Subjective:  Patient did not need BiPAP overnight. He says he's been breathing better every day and he's noted his abdominal has diminished in size with drain output. He says that he feels that he is near full strength.   Objective: Vital signs in last 24 hours: Filed Vitals:   01/30/15 0500 01/30/15 0530 01/30/15 0758 01/30/15 1158  BP: 122/77 122/77 107/64 107/57  Pulse: 95  73 61  Temp: 98.1 F (36.7 C)  98.7 F (37.1 C) 98.2 F (36.8 C)  TempSrc: Oral  Oral Oral  Resp: 22  106 20  Height:      Weight: 291 lb 1.6 oz (132.042 kg)     SpO2: 95%  93% 96%   Weight change: 7 lb 4.8 oz (3.311 kg)  Intake/Output Summary (Last 24 hours) at 01/30/15 1245 Last data filed at 01/30/15 1202  Gross per 24 hour  Intake   1461 ml  Output   3250 ml  Net  -1789 ml   Physical Exam General: Lying in bed, NAD Cardiovascular: Regular rhythm, no m/r/g Pulmonary: clear to anterior auscultation. No crackles today. No respiratory distress Abdominal: Colostomy bag in place draining brown fluid . Percutnaneous drain in place, draining brown fluid. Midline surgical site w/ wound vac. Wound site clean and dry. No abdominal or retroperitoneal tenderness. Says his left flank feels "sensitive" to touch Extremities: No clubbing, cyanosis, or edema. Psychiatric: Normal behavior and affect  Lab Results: Basic Metabolic Panel:  Recent Labs Lab 01/29/15 0420 01/30/15 0230  NA 140 139  K 3.7 3.4*  CL 87* 88*  CO2 43* 43*  GLUCOSE 171* 145*  BUN 23* 21*  CREATININE 0.97 0.96  CALCIUM 8.5* 8.0*   CBC:  Recent Labs Lab 01/29/15 0420 01/30/15 0230  WBC 18.3* 9.4  HGB 12.6* 12.8*  HCT 38.7* 39.6  MCV 97.5 95.2  PLT 383 349    CBG:  Recent Labs Lab 01/29/15 0733 01/29/15 1133 01/29/15 1708 01/29/15 2139 01/30/15 0757 01/30/15 1201  GLUCAP 159* 132* 173* 197* 125* 189*   Coagulation:  Recent Labs Lab 01/27/15 0341 01/28/15 0442 01/29/15 0420 01/30/15 0230  LABPROT  35.5* 34.1* 16.7* 15.6*  INR 3.66* 3.47* 1.34 1.22   Micro Results: Recent Results (from the past 240 hour(s))  Surgical pcr screen     Status: None   Collection Time: 01/28/15  3:52 PM  Result Value Ref Range Status   MRSA, PCR NEGATIVE NEGATIVE Final   Staphylococcus aureus NEGATIVE NEGATIVE Final    Comment:        The Xpert SA Assay (FDA approved for NASAL specimens in patients over 39 years of age), is one component of a comprehensive surveillance program.  Test performance has been validated by [facility] for patients greater than or equal to 51 year old. It is not intended to diagnose infection nor to guide or monitor treatment.   Culture, blood (routine x 2)     Status: None (Preliminary result)   Collection Time: 01/28/15  8:12 PM  Result Value Ref Range Status   Specimen Description BLOOD RIGHT HAND  Final   Special Requests IN PEDIATRIC BOTTLE 4.5CC  Final   Culture NO GROWTH < 12 HOURS  Final   Report Status PENDING  Incomplete  Culture, blood (routine x 2)     Status: None (Preliminary result)   Collection Time: 01/28/15  8:24 PM  Result Value Ref Range Status   Specimen Description BLOOD LEFT HAND  Final  Special Requests IN PEDIATRIC BOTTLE 6CC  Final   Culture NO GROWTH < 12 HOURS  Final   Report Status PENDING  Incomplete  Culture, routine-abscess     Status: None (Preliminary result)   Collection Time: 01/29/15  3:25 PM  Result Value Ref Range Status   Specimen Description ABSCESS ABDOMEN  Final   Special Requests NONE  Final   Gram Stain   Final    ABUNDANT WBC PRESENT,BOTH PMN AND MONONUCLEAR NO SQUAMOUS EPITHELIAL CELLS SEEN ABUNDANT GRAM POSITIVE COCCI IN PAIRS IN CHAINS IN CLUSTERS ABUNDANT GRAM NEGATIVE RODS FEW GRAM POSITIVE RODS    Culture   Final    Culture reincubated for better growth Performed at [facility]    Report Status PENDING  Incomplete   Studies/Results: Dg Chest 2 View  01/29/2015  CLINICAL DATA:  Congestive  heart failure EXAM: CHEST  2 VIEW COMPARISON:  01/27/2015 FINDINGS: Cardiomegaly again noted. Persistent mild congestion/ pulmonary edema. Bilateral small pleural effusion with bilateral basilar atelectasis or infiltrate. IMPRESSION: Persistent mild congestion/ pulmonary edema. Bilateral small pleural effusion with bilateral basilar atelectasis or infiltrate. Electronically Signed   By: Lahoma Crocker M.D.   On: 01/29/2015 18:41   Ct Chest W Contrast  01/28/2015  CLINICAL DATA:  Abscess.  Perforated diverticulitis. EXAM: CT CHEST, ABDOMEN, AND PELVIS WITH CONTRAST TECHNIQUE: Multidetector CT imaging of the chest, abdomen and pelvis was performed following the standard protocol during bolus administration of intravenous contrast. CONTRAST:  128mL OMNIPAQUE IOHEXOL 300 MG/ML  SOLN COMPARISON:  01/17/2015 FINDINGS: CT CHEST Mediastinum: The heart size is mildly enlarged. No pericardial effusion identified. The trachea appears patent and is midline. Unremarkable appearance of the esophagus. Multiple small lymph nodes are noted within the mediastinum. Likely reactive. No adenopathy identified. No supraclavicular or axillary adenopathy. Lungs/Pleura: There is a moderate volume left pleural effusion. A small loculated right pleural effusion is identified, image 85 of series 5. Atelectasis is noted within the right middle lobe and right lower lobe. There is airspace consolidation involving the left lower lobe. Musculoskeletal: No aggressive lytic or sclerotic bone lesions are identified. CT ABDOMEN AND PELVIS Hepatobiliary: There is a low density structure within the posterior right lobe of liver measuring 11 mm, image 52 of series 2. This is unchanged from previous exam. The gallbladder is normal. No biliary dilatation. Pancreas: Negative Spleen: Negative Adrenals/Urinary Tract: The normal appearance of the adrenal glands. The kidneys are unremarkable. Urinary bladder is negative. Stomach/Bowel: Stomach has a normal  caliber. There is no abnormal pathologic dilatation of the small bowel loops. The appendix is visualized and appears normal. The proximal scratch set there is a right sided colostomy. The proximal large bowel loops have a normal caliber. There is been dehiscence of the Hartman's pouch along the suture line. There is an associated large complex fluid collection involving the left side of abdomen measuring 13.5 x 8.3 by 30.9 cm. This extends into the upper abdomen an abuts the greater curvature of the stomach. There are multiple smaller fluid collections scratch set there are smaller collections of gas and fluid identified elsewhere within the abdomen particularly along the left pericolic gutter and retroperitoneum and extending over the spleen. No significant fluid collections identified within the right side of abdomen. Vascular/Lymphatic: Normal appearance of the abdominal aorta. No enlarged retroperitoneal or mesenteric adenopathy. No enlarged pelvic or inguinal lymph nodes. Reproductive: The prostate gland and seminal vesicles appear normal. Other: Multiple fluid collections secondary to dehiscence of Hartman's pouch identified. Musculoskeletal: No  aggressive lytic or sclerotic bone lesions. IMPRESSION: 1. Examination is significant for dehiscence of the Hartman's pouch along the suture line. There is a resultant large complex fluid collection involving much of the left hemi abdomen. Multiple smaller gas and fluid collections are identified scattered throughout the left abdomen especially within the left pericolic gutter and retroperitoneal space posterior to the left kidney. 2. Partially loculated right pleural effusion. 3. Moderate left effusion with overlying airspace consolidation. Critical Value/emergent results were called by telephone at the time of interpretation on 01/28/2015 at 1:41 pm to Dr. Hulen Luster, who verbally acknowledged these results. Electronically Signed   By: Kerby Moors M.D.   On: 01/28/2015  13:41   Ct Abdomen Pelvis W Contrast  01/28/2015  CLINICAL DATA:  Abscess.  Perforated diverticulitis. EXAM: CT CHEST, ABDOMEN, AND PELVIS WITH CONTRAST TECHNIQUE: Multidetector CT imaging of the chest, abdomen and pelvis was performed following the standard protocol during bolus administration of intravenous contrast. CONTRAST:  151mL OMNIPAQUE IOHEXOL 300 MG/ML  SOLN COMPARISON:  01/17/2015 FINDINGS: CT CHEST Mediastinum: The heart size is mildly enlarged. No pericardial effusion identified. The trachea appears patent and is midline. Unremarkable appearance of the esophagus. Multiple small lymph nodes are noted within the mediastinum. Likely reactive. No adenopathy identified. No supraclavicular or axillary adenopathy. Lungs/Pleura: There is a moderate volume left pleural effusion. A small loculated right pleural effusion is identified, image 85 of series 5. Atelectasis is noted within the right middle lobe and right lower lobe. There is airspace consolidation involving the left lower lobe. Musculoskeletal: No aggressive lytic or sclerotic bone lesions are identified. CT ABDOMEN AND PELVIS Hepatobiliary: There is a low density structure within the posterior right lobe of liver measuring 11 mm, image 52 of series 2. This is unchanged from previous exam. The gallbladder is normal. No biliary dilatation. Pancreas: Negative Spleen: Negative Adrenals/Urinary Tract: The normal appearance of the adrenal glands. The kidneys are unremarkable. Urinary bladder is negative. Stomach/Bowel: Stomach has a normal caliber. There is no abnormal pathologic dilatation of the small bowel loops. The appendix is visualized and appears normal. The proximal scratch set there is a right sided colostomy. The proximal large bowel loops have a normal caliber. There is been dehiscence of the Hartman's pouch along the suture line. There is an associated large complex fluid collection involving the left side of abdomen measuring 13.5 x 8.3 by  30.9 cm. This extends into the upper abdomen an abuts the greater curvature of the stomach. There are multiple smaller fluid collections scratch set there are smaller collections of gas and fluid identified elsewhere within the abdomen particularly along the left pericolic gutter and retroperitoneum and extending over the spleen. No significant fluid collections identified within the right side of abdomen. Vascular/Lymphatic: Normal appearance of the abdominal aorta. No enlarged retroperitoneal or mesenteric adenopathy. No enlarged pelvic or inguinal lymph nodes. Reproductive: The prostate gland and seminal vesicles appear normal. Other: Multiple fluid collections secondary to dehiscence of Hartman's pouch identified. Musculoskeletal: No aggressive lytic or sclerotic bone lesions. IMPRESSION: 1. Examination is significant for dehiscence of the Hartman's pouch along the suture line. There is a resultant large complex fluid collection involving much of the left hemi abdomen. Multiple smaller gas and fluid collections are identified scattered throughout the left abdomen especially within the left pericolic gutter and retroperitoneal space posterior to the left kidney. 2. Partially loculated right pleural effusion. 3. Moderate left effusion with overlying airspace consolidation. Critical Value/emergent results were called by telephone at the time of  interpretation on 01/28/2015 at 1:41 pm to Dr. Hulen Luster, who verbally acknowledged these results. Electronically Signed   By: Kerby Moors M.D.   On: 01/28/2015 13:41   Ct Image Guided Drainage By Percutaneous Catheter  01/29/2015  CLINICAL DATA:  50 year old male with a history of perioperative abscess. EXAM: CT GUIDED DRAINAGE OF ABDOMINAL ABSCESS ANESTHESIA/SEDATION: 1.5 Mg IV Versed 100 mcg IV Fentanyl Total Moderate Sedation Time:  15 minutes PROCEDURE: The procedure, risks, benefits, and alternatives were explained to the patient. Questions regarding the procedure  were encouraged and answered. The patient understands and consents to the procedure. The LEFT LOWER ABDOMINAL WALL was prepped with CHLORHEXIDINEin a sterile fashion, and a sterile drape was applied covering the operative field. A sterile gown and sterile gloves were used for the procedure. Local anesthesia was provided with 1% Lidocaine. Once the patient was prepped and draped in the sterile fashion, the skin and subcutaneous tissues were generously infiltrated with 1% lidocaine for local anesthesia. A small stab incision was made with 11 blade scalpel, a Yueh needle was advanced under CT guidance into the gas and fluid collection of the left lower abdomen. Using modified Seldinger technique, a 14 French drain was placed with serial dilation with 12 [place] and 14 [place] dilators. Pigtail catheter was formed, and aspiration of approximately were 1000 cc of feculent fluid was aspirated. Sample was sent to the lab. Catheter was sutured in position attached to gravity drainage. Patient tolerated the procedure well and remained hemodynamically stable throughout. No complications encountered and no significant blood loss encountered. COMPLICATIONS: None FINDINGS: CT demonstrates gas and fluid collection within the left lower quadrant. Decreased size status post drainage of approximately 1 L of fluid. No complicating features. IMPRESSION: Status post CT-guided drain placement into left abdominal abscess. Approximately 1000 cc of feculent fluid aspirated. Sample was sent to the lab for analysis. Signed, Dulcy Fanny. Earleen Newport, DO [facility] Electronically Signed   By: Corrie Mckusick D.O.   On: 01/29/2015 16:25   Medications: I have reviewed the patient's current medications. Scheduled Meds: . antiseptic oral rinse  7 mL Mouth Rinse BID  . digoxin  0.125 mg Oral Daily  . diltiazem  90 mg Oral 3 times per day  . feeding supplement  1 Container Oral TID BM  .  furosemide  40 mg Oral Daily  . Gerhardt's butt cream   Topical Daily  . insulin aspart  0-9 Units Subcutaneous TID WC  . metoprolol tartrate  150 mg Oral BID  . oxyCODONE  20 mg Oral Q12H  . pantoprazole  40 mg Oral Q1200  . piperacillin-tazobactam (ZOSYN)  IV  3.375 g Intravenous 3 times per day   Continuous Infusions: . 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 Repeat CT scan was obtained given ongoing difficulties managing his RVR and respiratory status. Surgery defers further ex-laps for now, recommends percutaneous drains for abscess. IR will place drains today. - oxycontin 20mg  BID scheduled for pain- goal of relief of pain to 4/10. Oxycodone 5mg  q4h prn for breakthrough pain.  - Levaquin (1/1 - 1/4 discontinued), Meropenem 1 mg q8h (1/1-1/9), Zosyn (1/11 - ),   - Will transition to Augmentin at outpatient to complete 14-day course preceded by Zosyn (final dose on 1/25) -  PM&R assessement for CIR services  Atrial Fibrillation with RVR: HR controlled (61-73). Will continue heparin IV for one to two more days and transition to pradaxa once it is clear he will not need another procedure in the near term. Low threshold to decrease Lopressor if he becomes bradycardic now that abscess is draining. - Lopressor 150 mg BID - Oral diltiazem 90 mg TID - digoxin 0.125 mg po daily  Hypoxia secondary to pulmonary edema: Satting 96% on 3LNC. Did not need bipap last night. - lasix 40 mg po daily - BiPAP prn - Wean O2 as tolerated - Atrovent and Xopenex nebulizer q2h prn  Anxiety: Ativan 0.5 mg IV BID  DVT prophylaxis: Warfarin  Dispo: Disposition is deferred at this time, awaiting improvement of  current medical problems.  Anticipated discharge in approximately 2-3 day(s).   The patient does have a current PCP Thompson Grayer, MD) and does not need an [facility] hospital follow-up appointment after discharge. Will set pt up with IMTS clinic for follow up.   The patient does have transportation limitations that hinder transportation to clinic appointments.  .Services Needed at time of discharge: Y = Yes, Blank = No PT:   OT:   RN:   Equipment:   Other:     LOS: 16 days   Liberty Handy, MD 01/30/2015, 12:45 PM

## 2015-01-30 NOTE — Plan of Care (Signed)
Problem: Health Behavior/Discharge Planning: Goal: Ability to manage health-related needs will improve Outcome: Progressing Discussed with patient the need/importance of eating better and to exercise and quit smoking and patient said that he will start when he gets out of here and that he has no cravings at this time for nicotene so he will try to continue not to smoke but he smoked 2 packs/day for years.

## 2015-01-30 NOTE — Progress Notes (Signed)
ANTICOAGULATION CONSULT NOTE - Follow Up Consult  Pharmacy Consult for Heparin Indication: atrial fibrillation  Allergies  Allergen Reactions  . Contrast Media [Iodinated Diagnostic Agents] Rash    a diffuse macular rash after CTA chest  Pt was premedicated with 125mg  IV Solumedrol, 50mg  IV Benadryl 1 hr prior to CTexam, and tolerated procedure without any difficulties.    Patient Measurements: Height: 5\' 11"  (180.3 cm) Weight: 291 lb 1.6 oz (132.042 kg) IBW/kg (Calculated) : 75.3 Heparin Dosing Weight: 105 kg  Vital Signs: Temp: 98.7 F (37.1 C) (01/13 0758) Temp Source: Oral (01/13 0758) BP: 107/64 mmHg (01/13 0758) Pulse Rate: 73 (01/13 0758)  Labs:  Recent Labs  01/28/15 0442 01/29/15 0420 01/30/15 0230 01/30/15 0942  HGB 13.5 12.6* 12.8*  --   HCT 41.4 38.7* 39.6  --   PLT 443* 383 349  --   LABPROT 34.1* 16.7* 15.6*  --   INR 3.47* 1.34 1.22  --   HEPARINUNFRC  --   --  <0.10* <0.10*  CREATININE 0.74 0.97 0.96  --     Estimated Creatinine Clearance: 131.9 mL/min (by C-G formula based on Cr of 0.96).  Assessment:   Heparin level remains undetectable (< 0.1) on 2500 units/hr.   Previously required 3300 units/hr to achieve target levels.   IV heparin resumed on 1/12 pm after drain placement.   INR down to 1.22.  Coumadin discontinued on 1/11, received Vitamin K 10 mg PO on 1/11.   Had been on Pradaxa at home.    Goal of Therapy:  Heparin level 0.3-0.7 units/ml Monitor platelets by anticoagulation protocol: Yes   Plan:   Increase heparin drip to 3000 units/hr.  Heparin level ~6hrs after rate change.  Daily heparin level and CBC while on heparin.  Resume Pradaxa when able.  Arty Baumgartner, [place] Pager: [phone] 01/30/2015,11:56 AM

## 2015-01-30 NOTE — Progress Notes (Signed)
ANTICOAGULATION CONSULT NOTE - Follow Up Consult  Pharmacy Consult for heparin Indication: atrial fibrillation   Labs:  Recent Labs  01/27/15 0341 01/28/15 0442 01/29/15 0420 01/30/15 0230  HGB 13.1 13.5 12.6* 12.8*  HCT 42.1 41.4 38.7* 39.6  PLT 453* 443* 383 349  LABPROT 35.5* 34.1* 16.7* 15.6*  INR 3.66* 3.47* 1.34 1.22  HEPARINUNFRC  --   --   --  <0.10*  CREATININE 0.85 0.74 0.97  --      Assessment: 50yo male undetectable on heparin after resumed at low rate, low-therapeutic level achieved earlier this admission w/ rate at 3300 units/hr.  Goal of Therapy:  Heparin level 0.3-0.7 units/ml   Plan:  Will increase heparin gtt substantially to 2500 units/hr given prior requirements though may require higher rate, check level in 6hr.  Wynona Neat, PharmD, BCPS  01/30/2015,3:08 AM

## 2015-01-30 NOTE — Care Management (Addendum)
  Case Management Note Initial Note: Started by Babette Relic on 01-24-15 Patient Details  Name: William Fischer MRN: [id number] Date of Birth: 25-Mar-1965  Subjective/Objective: Pt to d/c home with wound vac and home health RN for vac changes. Provided list of home health agencies, referral to [facility] liaison per choice. Documentation faxed to [facility] for vac. Pt states his sister is a Marine scientist and will be staying with him for at least a week after he is discharged.    Expected Discharge Plan: [place]  Discharge planning Services CM Consult  Post Acute Care Choice: Durable Medical Equipment, Home Health Choice offered to: Patient  DME Arranged: Vac DME Agency: [facility]  HH Arranged: RN [place] Agency: [place]  Status of Service: Completed  Additional Comments: 1117 02-02-15 Jacqlyn Krauss, RN,BSN [phone] CM did speak with MD in regards to plan for home and not SNF. The original plan was for SNF last week. Per MD pt is moving around better and wishes to go home. SNF would probably be better option however pt is not agreeable to SNF at this time. CM did make referral with [facility] for [facility], not able to get [facility] PT services with insurance. SOC to begin within 24-48 hours post d/c. Pt has Wound VAC via [facility] connected. No further needs from CM at this time.    1127 01-30-15 Jacqlyn Krauss, RN,BSN [phone] CM received pt from the unit. Wound VAC information had been faxed to [facility] and approved. The plan is for CIR to consult with SNF in mind as well for secondary. Consult to be placed for CIR 01-30-15. CM has been in contact with [facility] in regards to [facility], CM spoke to Alexia at [phone] stating that pt is post drain placement and may be going to SNF once medically stable-if plan is for SNF then the SNF will get the [facility]. If pt is to d/c over the weekend and the plan is some how switched to home with [facility] will  need to be called to make sure VAC will be delivered for home transport. Please call Ricki in ref to [facility]. Pt with Family Medicaid @ this time. CM will continue to monitor for additional needs.    01/24/15 8:38 [id number] [place] Received TC from pt's sister who stated she will not be able to take care of patient as he requested - she will return to [place] shortly. She is concerned about pt's mother's ability to manage him when he is discharged. Pt has Medicaid and will not qualify for either SNF for rehab or for home health PT. Eval yesterday noted pt's progress with mobility - CM will continue to follow.

## 2015-01-30 NOTE — Progress Notes (Signed)
Referring Physician(s): CCS  Chief Complaint:  abd abscess Drain placed LLQ 01/29/15  Subjective:  Feeling better already Less pressure; less pain Wbc down to 9.4 this am   Allergies: Contrast media  Medications: Prior to Admission medications   Medication Sig Start Date End Date Taking? Authorizing Provider  metoprolol (LOPRESSOR) 50 MG tablet TAKE 1 & 1/2 TABLETS (75 MG TOTAL) BY MOUTH 2 (TWO) TIMES DAILY. 04/28/14  Yes Thompson Grayer, MD  PRADAXA 150 MG CAPS capsule TAKE 1 CAPSULE BY MOUTH EVERY 12 HOURS 03/03/14  Yes Thompson Grayer, MD  spironolactone (ALDACTONE) 25 MG tablet TAKE 1 TABLET BY MOUTH EVERY DAY 03/03/14  Yes Thompson Grayer, MD  furosemide (LASIX) 20 MG tablet TAKE 1 TABLET BY MOUTH EVERY DAY 03/03/14   Thompson Grayer, MD     Vital Signs: BP 107/64 mmHg  Pulse 73  Temp(Src) 98.7 F (37.1 C) (Oral)  Resp 106  Ht 5\' 11"  (1.803 m)  Wt 291 lb 1.6 oz (132.042 kg)  BMI 40.62 kg/m2  SpO2 93%  Physical Exam  Abdominal: Soft.  Site of LLQ abscess drain clean and dry NT no bleeding Output 1 liter at placement 1 liter over night 500 cc in bag: brown purulent fluid   Musculoskeletal: Normal range of motion.  Skin: Skin is warm and dry.  Nursing note and vitals reviewed.   Imaging: Dg Chest 2 View  01/29/2015  CLINICAL DATA:  Congestive heart failure EXAM: CHEST  2 VIEW COMPARISON:  01/27/2015 FINDINGS: Cardiomegaly again noted. Persistent mild congestion/ pulmonary edema. Bilateral small pleural effusion with bilateral basilar atelectasis or infiltrate. IMPRESSION: Persistent mild congestion/ pulmonary edema. Bilateral small pleural effusion with bilateral basilar atelectasis or infiltrate. Electronically Signed   By: Lahoma Crocker M.D.   On: 01/29/2015 18:41   Ct Chest W Contrast  01/28/2015  CLINICAL DATA:  Abscess.  Perforated diverticulitis. EXAM: CT CHEST, ABDOMEN, AND PELVIS WITH CONTRAST TECHNIQUE: Multidetector CT imaging of the chest, abdomen and pelvis  was performed following the standard protocol during bolus administration of intravenous contrast. CONTRAST:  163mL OMNIPAQUE IOHEXOL 300 MG/ML  SOLN COMPARISON:  01/17/2015 FINDINGS: CT CHEST Mediastinum: The heart size is mildly enlarged. No pericardial effusion identified. The trachea appears patent and is midline. Unremarkable appearance of the esophagus. Multiple small lymph nodes are noted within the mediastinum. Likely reactive. No adenopathy identified. No supraclavicular or axillary adenopathy. Lungs/Pleura: There is a moderate volume left pleural effusion. A small loculated right pleural effusion is identified, image 85 of series 5. Atelectasis is noted within the right middle lobe and right lower lobe. There is airspace consolidation involving the left lower lobe. Musculoskeletal: No aggressive lytic or sclerotic bone lesions are identified. CT ABDOMEN AND PELVIS Hepatobiliary: There is a low density structure within the posterior right lobe of liver measuring 11 mm, image 52 of series 2. This is unchanged from previous exam. The gallbladder is normal. No biliary dilatation. Pancreas: Negative Spleen: Negative Adrenals/Urinary Tract: The normal appearance of the adrenal glands. The kidneys are unremarkable. Urinary bladder is negative. Stomach/Bowel: Stomach has a normal caliber. There is no abnormal pathologic dilatation of the small bowel loops. The appendix is visualized and appears normal. The proximal scratch set there is a right sided colostomy. The proximal large bowel loops have a normal caliber. There is been dehiscence of the Hartman's pouch along the suture line. There is an associated large complex fluid collection involving the left side of abdomen measuring 13.5 x 8.3 by 30.9 cm.  This extends into the upper abdomen an abuts the greater curvature of the stomach. There are multiple smaller fluid collections scratch set there are smaller collections of gas and fluid identified elsewhere within  the abdomen particularly along the left pericolic gutter and retroperitoneum and extending over the spleen. No significant fluid collections identified within the right side of abdomen. Vascular/Lymphatic: Normal appearance of the abdominal aorta. No enlarged retroperitoneal or mesenteric adenopathy. No enlarged pelvic or inguinal lymph nodes. Reproductive: The prostate gland and seminal vesicles appear normal. Other: Multiple fluid collections secondary to dehiscence of Hartman's pouch identified. Musculoskeletal: No aggressive lytic or sclerotic bone lesions. IMPRESSION: 1. Examination is significant for dehiscence of the Hartman's pouch along the suture line. There is a resultant large complex fluid collection involving much of the left hemi abdomen. Multiple smaller gas and fluid collections are identified scattered throughout the left abdomen especially within the left pericolic gutter and retroperitoneal space posterior to the left kidney. 2. Partially loculated right pleural effusion. 3. Moderate left effusion with overlying airspace consolidation. Critical Value/emergent results were called by telephone at the time of interpretation on 01/28/2015 at 1:41 pm to Dr. Hulen Luster, who verbally acknowledged these results. Electronically Signed   By: Kerby Moors M.D.   On: 01/28/2015 13:41   Ct Abdomen Pelvis W Contrast  01/28/2015  CLINICAL DATA:  Abscess.  Perforated diverticulitis. EXAM: CT CHEST, ABDOMEN, AND PELVIS WITH CONTRAST TECHNIQUE: Multidetector CT imaging of the chest, abdomen and pelvis was performed following the standard protocol during bolus administration of intravenous contrast. CONTRAST:  146mL OMNIPAQUE IOHEXOL 300 MG/ML  SOLN COMPARISON:  01/17/2015 FINDINGS: CT CHEST Mediastinum: The heart size is mildly enlarged. No pericardial effusion identified. The trachea appears patent and is midline. Unremarkable appearance of the esophagus. Multiple small lymph nodes are noted within the  mediastinum. Likely reactive. No adenopathy identified. No supraclavicular or axillary adenopathy. Lungs/Pleura: There is a moderate volume left pleural effusion. A small loculated right pleural effusion is identified, image 85 of series 5. Atelectasis is noted within the right middle lobe and right lower lobe. There is airspace consolidation involving the left lower lobe. Musculoskeletal: No aggressive lytic or sclerotic bone lesions are identified. CT ABDOMEN AND PELVIS Hepatobiliary: There is a low density structure within the posterior right lobe of liver measuring 11 mm, image 52 of series 2. This is unchanged from previous exam. The gallbladder is normal. No biliary dilatation. Pancreas: Negative Spleen: Negative Adrenals/Urinary Tract: The normal appearance of the adrenal glands. The kidneys are unremarkable. Urinary bladder is negative. Stomach/Bowel: Stomach has a normal caliber. There is no abnormal pathologic dilatation of the small bowel loops. The appendix is visualized and appears normal. The proximal scratch set there is a right sided colostomy. The proximal large bowel loops have a normal caliber. There is been dehiscence of the Hartman's pouch along the suture line. There is an associated large complex fluid collection involving the left side of abdomen measuring 13.5 x 8.3 by 30.9 cm. This extends into the upper abdomen an abuts the greater curvature of the stomach. There are multiple smaller fluid collections scratch set there are smaller collections of gas and fluid identified elsewhere within the abdomen particularly along the left pericolic gutter and retroperitoneum and extending over the spleen. No significant fluid collections identified within the right side of abdomen. Vascular/Lymphatic: Normal appearance of the abdominal aorta. No enlarged retroperitoneal or mesenteric adenopathy. No enlarged pelvic or inguinal lymph nodes. Reproductive: The prostate gland and seminal vesicles appear  normal. Other: Multiple fluid collections secondary to dehiscence of Hartman's pouch identified. Musculoskeletal: No aggressive lytic or sclerotic bone lesions. IMPRESSION: 1. Examination is significant for dehiscence of the Hartman's pouch along the suture line. There is a resultant large complex fluid collection involving much of the left hemi abdomen. Multiple smaller gas and fluid collections are identified scattered throughout the left abdomen especially within the left pericolic gutter and retroperitoneal space posterior to the left kidney. 2. Partially loculated right pleural effusion. 3. Moderate left effusion with overlying airspace consolidation. Critical Value/emergent results were called by telephone at the time of interpretation on 01/28/2015 at 1:41 pm to Dr. Hulen Luster, who verbally acknowledged these results. Electronically Signed   By: Kerby Moors M.D.   On: 01/28/2015 13:41   Dg Chest Port 1 View  01/27/2015  CLINICAL DATA:  Respiratory distress. EXAM: PORTABLE CHEST 1 VIEW COMPARISON:  January 26, 2015. FINDINGS: Stable cardiomegaly and central pulmonary vascular congestion is noted. Bilateral perihilar and basilar interstitial densities are noted concerning for edema. Stable bilateral pleural effusions are noted, with left greater than right. No pneumothorax is noted. IMPRESSION: Stable bilateral opacities most consistent with edema with associated pleural effusions. Electronically Signed   By: Marijo Conception, M.D.   On: 01/27/2015 21:19   Ct Image Guided Drainage By Percutaneous Catheter  01/29/2015  CLINICAL DATA:  50 year old male with a history of perioperative abscess. EXAM: CT GUIDED DRAINAGE OF ABDOMINAL ABSCESS ANESTHESIA/SEDATION: 1.5 Mg IV Versed 100 mcg IV Fentanyl Total Moderate Sedation Time:  15 minutes PROCEDURE: The procedure, risks, benefits, and alternatives were explained to the patient. Questions regarding the procedure were encouraged and answered. The patient understands  and consents to the procedure. The LEFT LOWER ABDOMINAL WALL was prepped with CHLORHEXIDINEin a sterile fashion, and a sterile drape was applied covering the operative field. A sterile gown and sterile gloves were used for the procedure. Local anesthesia was provided with 1% Lidocaine. Once the patient was prepped and draped in the sterile fashion, the skin and subcutaneous tissues were generously infiltrated with 1% lidocaine for local anesthesia. A small stab incision was made with 11 blade scalpel, a Yueh needle was advanced under CT guidance into the gas and fluid collection of the left lower abdomen. Using modified Seldinger technique, a 14 French drain was placed with serial dilation with 12 [place] and 14 [place] dilators. Pigtail catheter was formed, and aspiration of approximately were 1000 cc of feculent fluid was aspirated. Sample was sent to the lab. Catheter was sutured in position attached to gravity drainage. Patient tolerated the procedure well and remained hemodynamically stable throughout. No complications encountered and no significant blood loss encountered. COMPLICATIONS: None FINDINGS: CT demonstrates gas and fluid collection within the left lower quadrant. Decreased size status post drainage of approximately 1 L of fluid. No complicating features. IMPRESSION: Status post CT-guided drain placement into left abdominal abscess. Approximately 1000 cc of feculent fluid aspirated. Sample was sent to the lab for analysis. Signed, Dulcy Fanny. Earleen Newport, DO [facility] Electronically Signed   By: Corrie Mckusick D.O.   On: 01/29/2015 16:25    Labs:  CBC:  Recent Labs  01/27/15 0341 01/28/15 0442 01/29/15 0420 01/30/15 0230  WBC 11.5* 17.5* 18.3* 9.4  HGB 13.1 13.5 12.6* 12.8*  HCT 42.1 41.4 38.7* 39.6  PLT 453* 443* 383 349    COAGS:  Recent Labs  01/27/15 0341 01/28/15 0442 01/29/15 0420 01/30/15 0230  INR 3.66* 3.47* 1.34  1.22    BMP:  Recent Labs  01/27/15 0341 01/28/15 0442 01/29/15 0420 01/30/15 0230  NA 140 138 140 139  K 3.9 3.2* 3.7 3.4*  CL 85* 88* 87* 88*  CO2 47* 42* 43* 43*  GLUCOSE 187* 125* 171* 145*  BUN 14 16 23* 21*  CALCIUM 8.2* 8.2* 8.5* 8.0*  CREATININE 0.85 0.74 0.97 0.96  GFRNONAA >60 >60 >60 >60  GFRAA >60 >60 >60 >60    LIVER FUNCTION TESTS:  Recent Labs  01/14/15 0906 01/19/15 0445  BILITOT 1.2 1.1  AST 22 47*  ALT 21 26  ALKPHOS 85 67  PROT 6.7 4.9*  ALBUMIN 2.6* 1.4*    Assessment and Plan:  Intra abdominal abscess drain placed 1/12 Will follow  Electronically Signed: Jaaziah Schulke A 01/30/2015, 11:52 AM   I spent a total of 15 Minutes at the the patient's bedside AND on the patient's hospital floor or unit, greater than 50% of which was counseling/coordinating care for abscess rain

## 2015-01-30 NOTE — Progress Notes (Signed)
UR Completed Caitlynn Ju Graves-Bigelow, RN,BSN [phone]  

## 2015-01-31 DIAGNOSIS — I5040 Unspecified combined systolic (congestive) and diastolic (congestive) heart failure: Secondary | ICD-10-CM

## 2015-01-31 LAB — CBC
HCT: 37.8 % — ABNORMAL LOW (ref 39.0–52.0)
Hemoglobin: 12.3 g/dL — ABNORMAL LOW (ref 13.0–17.0)
MCH: 31.7 pg (ref 26.0–34.0)
MCHC: 32.5 g/dL (ref 30.0–36.0)
MCV: 97.4 fL (ref 78.0–100.0)
PLATELETS: 310 10*3/uL (ref 150–400)
RBC: 3.88 MIL/uL — AB (ref 4.22–5.81)
RDW: 15.3 % (ref 11.5–15.5)
WBC: 9.2 10*3/uL (ref 4.0–10.5)

## 2015-01-31 LAB — BASIC METABOLIC PANEL
ANION GAP: 12 (ref 5–15)
BUN: 15 mg/dL (ref 6–20)
CALCIUM: 8 mg/dL — AB (ref 8.9–10.3)
CO2: 38 mmol/L — AB (ref 22–32)
CREATININE: 0.78 mg/dL (ref 0.61–1.24)
Chloride: 90 mmol/L — ABNORMAL LOW (ref 101–111)
GLUCOSE: 178 mg/dL — AB (ref 65–99)
Potassium: 3.5 mmol/L (ref 3.5–5.1)
Sodium: 140 mmol/L (ref 135–145)

## 2015-01-31 LAB — GLUCOSE, CAPILLARY
GLUCOSE-CAPILLARY: 130 mg/dL — AB (ref 65–99)
GLUCOSE-CAPILLARY: 195 mg/dL — AB (ref 65–99)
GLUCOSE-CAPILLARY: 236 mg/dL — AB (ref 65–99)
Glucose-Capillary: 121 mg/dL — ABNORMAL HIGH (ref 65–99)

## 2015-01-31 LAB — HEPARIN LEVEL (UNFRACTIONATED)
HEPARIN UNFRACTIONATED: 0.11 [IU]/mL — AB (ref 0.30–0.70)
Heparin Unfractionated: 0.67 IU/mL (ref 0.30–0.70)

## 2015-01-31 MED ORDER — DILTIAZEM HCL ER COATED BEADS 120 MG PO CP24: 120.0000 mg | ORAL_CAPSULE | Freq: Every day | ORAL | Status: DC

## 2015-01-31 MED ORDER — DILTIAZEM HCL 60 MG PO TABS
60.0000 mg | ORAL_TABLET | Freq: Three times a day (TID) | ORAL | Status: AC
  Administered 2015-01-31 (×2): 60 mg via ORAL
  Filled 2015-01-31 (×2): qty 1

## 2015-01-31 MED ORDER — DILTIAZEM HCL 60 MG PO TABS: 60.0000 mg | ORAL_TABLET | Freq: Three times a day (TID) | ORAL | Status: DC

## 2015-01-31 MED ORDER — DILTIAZEM HCL ER COATED BEADS 120 MG PO CP24
120.0000 mg | ORAL_CAPSULE | Freq: Every day | ORAL | Status: DC
  Administered 2015-02-01 – 2015-02-02 (×2): 120 mg via ORAL
  Filled 2015-01-31 (×2): qty 1

## 2015-01-31 NOTE — Progress Notes (Signed)
ANTICOAGULATION CONSULT NOTE - Follow Up Consult  Pharmacy Consult for heparin Indication: atrial fibrillation   Labs:  Recent Labs  01/28/15 0442 01/29/15 0420  01/30/15 0230 01/30/15 0942 01/30/15 1853 01/31/15 0137  HGB 13.5 12.6*  --  12.8*  --   --  12.3*  HCT 41.4 38.7*  --  39.6  --   --  37.8*  PLT 443* 383  --  349  --   --  310  LABPROT 34.1* 16.7*  --  15.6*  --   --   --   INR 3.47* 1.34  --  1.22  --   --   --   HEPARINUNFRC  --   --   < > <0.10* <0.10* <0.10* 0.11*  CREATININE 0.74 0.97  --  0.96  --   --   --   < > = values in this interval not displayed.   Assessment: 50yo male remains subtherapeutic on heparin after IV site change, low-therapeutic level achieved earlier this admission w/ rate at 3300 units/hr.  Goal of Therapy:  Heparin level 0.3-0.7 units/ml   Plan:  Will increase heparin gtt 3500 units/hr given prior requirements and check level in 6hr.  Wynona Neat, PharmD, BCPS  01/31/2015,2:39 AM

## 2015-01-31 NOTE — Progress Notes (Signed)
Subjective: Pt feeling better this morning, states output from drain has decreased.   Objective: Vital signs in last 24 hours: Filed Vitals:   01/31/15 0000 01/31/15 0400 01/31/15 0602 01/31/15 0814  BP: 91/57 98/62 108/75 117/66  Pulse: 61 96  84  Temp: 98.8 F (37.1 C) 97.8 F (36.6 C)  97.9 F (36.6 C)  TempSrc: Axillary Oral  Oral  Resp: 18 18  18   Height:      Weight:  287 lb 4.8 oz (130.318 kg)    SpO2: 92% 97%     Weight change: -3 lb 12.8 oz (-1.724 kg)  Intake/Output Summary (Last 24 hours) at 01/31/15 1216 Last data filed at 01/31/15 1100  Gross per 24 hour  Intake   1935 ml  Output   3385 ml  Net  -1450 ml   Physical Exam General: Lying in bed, NAD Cardiovascular: Regular rhythm, no m/r/g Pulmonary: clear to anterior auscultation. No crackles today. No respiratory distress Abdominal: Colostomy bag in place draining brown fluid . Percutnaneous drain in place, draining light brown fluid. Midline surgical site w/ wound vac. Wound site clean and dry. No abdominal tenderness.  Extremities: No clubbing, cyanosis, or edema. Psychiatric: Normal behavior and affect  Lab Results: Basic Metabolic Panel:  Recent Labs Lab 01/30/15 0230 01/31/15 0137  NA 139 140  K 3.4* 3.5  CL 88* 90*  CO2 43* 38*  GLUCOSE 145* 178*  BUN 21* 15  CREATININE 0.96 0.78  CALCIUM 8.0* 8.0*   CBC:  Recent Labs Lab 01/30/15 0230 01/31/15 0137  WBC 9.4 9.2  HGB 12.8* 12.3*  HCT 39.6 37.8*  MCV 95.2 97.4  PLT 349 310    CBG:  Recent Labs Lab 01/30/15 0757 01/30/15 1201 01/30/15 1623 01/30/15 2027 01/31/15 0756 01/31/15 1116  GLUCAP 125* 189* 172* 123* 130* 195*   Coagulation:  Recent Labs Lab 01/27/15 0341 01/28/15 0442 01/29/15 0420 01/30/15 0230  LABPROT 35.5* 34.1* 16.7* 15.6*  INR 3.66* 3.47* 1.34 1.22   Micro Results: Recent Results (from the past 240 hour(s))  Surgical pcr screen     Status: None   Collection Time: 01/28/15  3:52 PM  Result  Value Ref Range Status   MRSA, PCR NEGATIVE NEGATIVE Final   Staphylococcus aureus NEGATIVE NEGATIVE Final    Comment:        The Xpert SA Assay (FDA approved for NASAL specimens in patients over 90 years of age), is one component of a comprehensive surveillance program.  Test performance has been validated by [facility] for patients greater than or equal to 7 year old. It is not intended to diagnose infection nor to guide or monitor treatment.   Culture, blood (routine x 2)     Status: None (Preliminary result)   Collection Time: 01/28/15  8:12 PM  Result Value Ref Range Status   Specimen Description BLOOD RIGHT HAND  Final   Special Requests IN PEDIATRIC BOTTLE 4.5CC  Final   Culture NO GROWTH 2 DAYS  Final   Report Status PENDING  Incomplete  Culture, blood (routine x 2)     Status: None (Preliminary result)   Collection Time: 01/28/15  8:24 PM  Result Value Ref Range Status   Specimen Description BLOOD LEFT HAND  Final   Special Requests IN PEDIATRIC BOTTLE 6CC  Final   Culture NO GROWTH 2 DAYS  Final   Report Status PENDING  Incomplete  Culture, routine-abscess     Status: None (Preliminary result)  Collection Time: 01/29/15  3:25 PM  Result Value Ref Range Status   Specimen Description ABSCESS ABDOMEN  Final   Special Requests NONE  Final   Gram Stain   Final    ABUNDANT WBC PRESENT,BOTH PMN AND MONONUCLEAR NO SQUAMOUS EPITHELIAL CELLS SEEN ABUNDANT GRAM POSITIVE COCCI IN PAIRS IN CHAINS IN CLUSTERS ABUNDANT GRAM NEGATIVE RODS FEW GRAM POSITIVE RODS    Culture   Final    MODERATE GRAM NEGATIVE RODS Performed at [facility]    Report Status PENDING  Incomplete   Studies/Results: Dg Chest 2 View  01/29/2015  CLINICAL DATA:  Congestive heart failure EXAM: CHEST  2 VIEW COMPARISON:  01/27/2015 FINDINGS: Cardiomegaly again noted. Persistent mild congestion/ pulmonary edema. Bilateral small pleural effusion with bilateral basilar atelectasis or  infiltrate. IMPRESSION: Persistent mild congestion/ pulmonary edema. Bilateral small pleural effusion with bilateral basilar atelectasis or infiltrate. Electronically Signed   By: Lahoma Crocker M.D.   On: 01/29/2015 18:41   Ct Image Guided Drainage By Percutaneous Catheter  01/29/2015  CLINICAL DATA:  50 year old male with a history of perioperative abscess. EXAM: CT GUIDED DRAINAGE OF ABDOMINAL ABSCESS ANESTHESIA/SEDATION: 1.5 Mg IV Versed 100 mcg IV Fentanyl Total Moderate Sedation Time:  15 minutes PROCEDURE: The procedure, risks, benefits, and alternatives were explained to the patient. Questions regarding the procedure were encouraged and answered. The patient understands and consents to the procedure. The LEFT LOWER ABDOMINAL WALL was prepped with CHLORHEXIDINEin a sterile fashion, and a sterile drape was applied covering the operative field. A sterile gown and sterile gloves were used for the procedure. Local anesthesia was provided with 1% Lidocaine. Once the patient was prepped and draped in the sterile fashion, the skin and subcutaneous tissues were generously infiltrated with 1% lidocaine for local anesthesia. A small stab incision was made with 11 blade scalpel, a Yueh needle was advanced under CT guidance into the gas and fluid collection of the left lower abdomen. Using modified Seldinger technique, a 14 French drain was placed with serial dilation with 12 [place] and 14 [place] dilators. Pigtail catheter was formed, and aspiration of approximately were 1000 cc of feculent fluid was aspirated. Sample was sent to the lab. Catheter was sutured in position attached to gravity drainage. Patient tolerated the procedure well and remained hemodynamically stable throughout. No complications encountered and no significant blood loss encountered. COMPLICATIONS: None FINDINGS: CT demonstrates gas and fluid collection within the left lower quadrant. Decreased size status post drainage of approximately 1 L of fluid.  No complicating features. IMPRESSION: Status post CT-guided drain placement into left abdominal abscess. Approximately 1000 cc of feculent fluid aspirated. Sample was sent to the lab for analysis. Signed, Dulcy Fanny. Earleen Newport, DO [facility] Electronically Signed   By: Corrie Mckusick D.O.   On: 01/29/2015 16:25   Medications: I have reviewed the patient's current medications. Scheduled Meds: . antiseptic oral rinse  7 mL Mouth Rinse BID  . digoxin  0.125 mg Oral Daily  . diltiazem  60 mg Oral 3 times per day  . feeding supplement  1 Container Oral TID BM  . furosemide  40 mg Oral Daily  . Gerhardt's butt cream   Topical Daily  . insulin aspart  0-9 Units Subcutaneous TID WC  . metoprolol tartrate  150 mg Oral BID  . oxyCODONE  20 mg Oral Q12H  . pantoprazole  40 mg Oral Q1200  . piperacillin-tazobactam (ZOSYN)  IV  3.375 g Intravenous 3 times  per day   Continuous Infusions: . 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 Output from drainage has decreased, spoke w/ Dr. Barbie Banner from IR who recommends flushing drain TID and can use up to three 10cc syringes to flush if needed. Spoke w/ pt's nurse albert and informed him to flush drain TID and to contact Dr. Barbie Banner if having difficulty with flushing drain.  - oxycontin 20mg  BID scheduled for pain- goal of relief of pain to 4/10. Oxycodone 5mg  q4h prn for breakthrough pain.  - Levaquin (1/1 - 1/4 discontinued), Meropenem 1 mg q8h (1/1-1/9), Zosyn (1/11 - ),   - Will transition to Augmentin at outpatient to complete 14-day course preceded by Zosyn (final dose on 1/25) - per surgery will likely need a CT scan early this week,  Surgery PA will readdress this again tomorrow during rounds.   Atrial Fibrillation with RVR: HR controlled (61-73). Will continue heparin IV for one to two more days and transition to pradaxa once it is clear he will not need another procedure in the near term. Low threshold to decrease Lopressor if he becomes bradycardic now that abscess is draining. - Lopressor 150 mg BID - cardizem 60mg  TID today, then transition to cardizem CD 120mg  daily toorrow - digoxin 0.125 mg po daily  Hypoxia secondary to pulmonary edema: no longer requiring BIPAP. On 3L [place] - lasix 40 mg po daily - BiPAP prn - Wean O2 as tolerated - Atrovent and Xopenex nebulizer q2h prn  Anxiety: Ativan 0.5 mg IV BID  DVT prophylaxis: hep gtt, will d/c home on pradaxa   Dispo: Disposition is deferred at this time, awaiting improvement of current medical problems.   The patient does have a current PCP Thompson Grayer, MD) and does not need an [facility] hospital follow-up appointment after discharge. Will set pt up with IMTS clinic for follow up.   The patient does have transportation limitations that hinder transportation to clinic appointments.  .Services Needed at time of discharge: Y = Yes, Blank = No PT:   OT:   RN:   Equipment:   Other:     LOS: 17 days   Norman Herrlich, MD 01/31/2015, 12:16 PM

## 2015-01-31 NOTE — Progress Notes (Signed)
Patient ID: William Fischer, male   DOB: Oct 28, 1965, 50 y.o.   MRN: [id number]  [facility]  POD#: 14  Subjective: Patient in bed, comfortable, tolerating regular diet.  No complaints.  Objective: Vital signs in last 24 hours: Temp:  [97.7 F (36.5 C)-98.8 F (37.1 C)] 97.9 F (36.6 C) (01/14 0814) Pulse Rate:  [61-96] 84 (01/14 0814) Resp:  [18-20] 18 (01/14 0814) BP: (91-117)/(51-75) 117/66 mmHg (01/14 0814) SpO2:  [92 %-100 %] 97 % (01/14 0400) Weight:  [130.318 kg (287 lb 4.8 oz)] 130.318 kg (287 lb 4.8 oz) (01/14 0400) Last BM Date: 01/30/15  Intake/Output from previous day: 01/13 0701 - 01/14 0700 In: 2415 [P.O.:1920; I.V.:345; IV Piggyback:150] Out: [id number] [Urine:2425; Drains:350; Stool:476] Intake/Output this shift:    Physical Exam: HEENT - sclerae clear, mucous membranes moist Neck - soft Chest - clear bilaterally Cor - RRR Abdomen - obese; midline VAC dressing intact; stoma right mid abd wall viable with small liquid stool; drain LLQ with thick purulent brown output Ext - no edema, non-tender Neuro - alert & oriented, no focal deficits  Lab Results:   Recent Labs  01/30/15 0230 01/31/15 0137  WBC 9.4 9.2  HGB 12.8* 12.3*  HCT 39.6 37.8*  PLT 349 310   BMET  Recent Labs  01/30/15 0230 01/31/15 0137  NA 139 140  K 3.4* 3.5  CL 88* 90*  CO2 43* 38*  GLUCOSE 145* 178*  BUN 21* 15  CREATININE 0.96 0.78  CALCIUM 8.0* 8.0*   PT/INR  Recent Labs  01/29/15 0420 01/30/15 0230  LABPROT 16.7* 15.6*  INR 1.34 1.22   Comprehensive Metabolic Panel:    Component Value Date/Time   NA 140 01/31/2015 0137   NA 139 01/30/2015 0230   K 3.5 01/31/2015 0137   K 3.4* 01/30/2015 0230   CL 90* 01/31/2015 0137   CL 88* 01/30/2015 0230   CO2 38* 01/31/2015 0137   CO2 43* 01/30/2015 0230   BUN 15 01/31/2015 0137   BUN 21* 01/30/2015 0230   CREATININE 0.78 01/31/2015 0137   CREATININE 0.96 01/30/2015 0230   GLUCOSE  178* 01/31/2015 0137   GLUCOSE 145* 01/30/2015 0230   CALCIUM 8.0* 01/31/2015 0137   CALCIUM 8.0* 01/30/2015 0230   AST 47* 01/19/2015 0445   AST 22 01/14/2015 0906   ALT 26 01/19/2015 0445   ALT 21 01/14/2015 0906   ALKPHOS 67 01/19/2015 0445   ALKPHOS 85 01/14/2015 0906   BILITOT 1.1 01/19/2015 0445   BILITOT 1.2 01/14/2015 0906   PROT 4.9* 01/19/2015 0445   PROT 6.7 01/14/2015 0906   ALBUMIN 1.4* 01/19/2015 0445   ALBUMIN 2.6* 01/14/2015 0906    Studies/Results: Dg Chest 2 View  01/29/2015  CLINICAL DATA:  Congestive heart failure EXAM: CHEST  2 VIEW COMPARISON:  01/27/2015 FINDINGS: Cardiomegaly again noted. Persistent mild congestion/ pulmonary edema. Bilateral small pleural effusion with bilateral basilar atelectasis or infiltrate. IMPRESSION: Persistent mild congestion/ pulmonary edema. Bilateral small pleural effusion with bilateral basilar atelectasis or infiltrate. Electronically Signed   By: Lahoma Crocker M.D.   On: 01/29/2015 18:41   Ct Image Guided Drainage By Percutaneous Catheter  01/29/2015  CLINICAL DATA:  50 year old male with a history of perioperative abscess. EXAM: CT GUIDED DRAINAGE OF ABDOMINAL ABSCESS ANESTHESIA/SEDATION: 1.5 Mg IV Versed 100 mcg IV Fentanyl Total Moderate Sedation Time:  15 minutes PROCEDURE: The procedure, risks, benefits, and alternatives were explained to the patient. Questions regarding the procedure were  encouraged and answered. The patient understands and consents to the procedure. The LEFT LOWER ABDOMINAL WALL was prepped with CHLORHEXIDINEin a sterile fashion, and a sterile drape was applied covering the operative field. A sterile gown and sterile gloves were used for the procedure. Local anesthesia was provided with 1% Lidocaine. Once the patient was prepped and draped in the sterile fashion, the skin and subcutaneous tissues were generously infiltrated with 1% lidocaine for local anesthesia. A small stab incision was made with 11 blade scalpel,  a Yueh needle was advanced under CT guidance into the gas and fluid collection of the left lower abdomen. Using modified Seldinger technique, a 14 French drain was placed with serial dilation with 12 [place] and 14 [place] dilators. Pigtail catheter was formed, and aspiration of approximately were 1000 cc of feculent fluid was aspirated. Sample was sent to the lab. Catheter was sutured in position attached to gravity drainage. Patient tolerated the procedure well and remained hemodynamically stable throughout. No complications encountered and no significant blood loss encountered. COMPLICATIONS: None FINDINGS: CT demonstrates gas and fluid collection within the left lower quadrant. Decreased size status post drainage of approximately 1 L of fluid. No complicating features. IMPRESSION: Status post CT-guided drain placement into left abdominal abscess. Approximately 1000 cc of feculent fluid aspirated. Sample was sent to the lab for analysis. Signed, Dulcy Fanny. Earleen Newport, DO [facility] Electronically Signed   By: Corrie Mckusick D.O.   On: 01/29/2015 16:25    Anti-infectives: Anti-infectives    Start     Dose/Rate Route Frequency Ordered Stop   01/28/15 1500  piperacillin-tazobactam (ZOSYN) IVPB 3.375 g     3.375 g 12.5 mL/hr over 240 Minutes Intravenous 3 times per day 01/28/15 1409     01/18/15 0400  levofloxacin (LEVAQUIN) IVPB 750 mg  Status:  Discontinued     750 mg 100 mL/hr over 90 Minutes Intravenous Every 24 hours 01/18/15 0210 01/21/15 1108   01/18/15 0215  meropenem (MERREM) 1 g in sodium chloride 0.9 % 100 mL IVPB  Status:  Discontinued     1 g 200 mL/hr over 30 Minutes Intravenous 3 times per day 01/18/15 0210 01/26/15 0822   01/16/15 0930  ertapenem (INVANZ) 1 g in sodium chloride 0.9 % 50 mL IVPB  Status:  Discontinued     1 g 100 mL/hr over 30 Minutes Intravenous Every 24 hours 01/16/15 0841 01/18/15 0203   01/14/15 1245   piperacillin-tazobactam (ZOSYN) IVPB 3.375 g  Status:  Discontinued     3.375 g 12.5 mL/hr over 240 Minutes Intravenous 3 times per day 01/14/15 1238 01/16/15 0841   01/14/15 1230  ciprofloxacin (CIPRO) IVPB 400 mg  Status:  Discontinued     400 mg 200 mL/hr over 60 Minutes Intravenous  Once 01/14/15 1218 01/14/15 1238   01/14/15 1230  metroNIDAZOLE (FLAGYL) IVPB 500 mg  Status:  Discontinued     500 mg 100 mL/hr over 60 Minutes Intravenous  Once 01/14/15 1218 01/14/15 1238      Assessment & Plans: 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 Office: [place] 01/31/2015

## 2015-01-31 NOTE — Progress Notes (Signed)
ANTICOAGULATION CONSULT NOTE - Follow Up Consult  Pharmacy Consult for Heparin Indication: atrial fibrillation  Allergies  Allergen Reactions  . Contrast Media [Iodinated Diagnostic Agents] Rash    a diffuse macular rash after CTA chest  Pt was premedicated with 125mg  IV Solumedrol, 50mg  IV Benadryl 1 hr prior to CTexam, and tolerated procedure without any difficulties.    Patient Measurements: Height: 5\' 11"  (180.3 cm) Weight: 287 lb 4.8 oz (130.318 kg) IBW/kg (Calculated) : 75.3 Heparin Dosing Weight: 105 kg  Vital Signs: Temp: 97.4 F (36.3 C) (01/14 1314) Temp Source: Oral (01/14 1314) BP: 102/62 mmHg (01/14 1314) Pulse Rate: 82 (01/14 1314)  Labs:  Recent Labs  01/29/15 0420 01/30/15 0230  01/30/15 1853 01/31/15 0137 01/31/15 1235  HGB 12.6* 12.8*  --   --  12.3*  --   HCT 38.7* 39.6  --   --  37.8*  --   PLT 383 349  --   --  310  --   LABPROT 16.7* 15.6*  --   --   --   --   INR 1.34 1.22  --   --   --   --   HEPARINUNFRC  --  <0.10*  < > <0.10* 0.11* 0.67  CREATININE 0.97 0.96  --   --  0.78  --   < > = values in this interval not displayed.  Estimated Creatinine Clearance: 157.1 mL/min (by C-G formula based on Cr of 0.78).  Assessment:   Heparin level is now therapeutic (0.67) on 3500 units/hr.   IV heparin resumed on 1/12 pm after drain placement.   INR down to 1.22.  Coumadin discontinued on 1/11, received Vitamin K 10 mg PO on 1/11.   Had been on Pradaxa at home.    Goal of Therapy:  Heparin level 0.3-0.7 units/ml Monitor platelets by anticoagulation protocol: Yes   Plan:   Continue heparin drip at 3500 units/hr.  Daily heparin level and CBC while on heparin.  Resume Pradaxa when able.  Arty Baumgartner, [place] Pager: [phone] 01/31/2015,1:33 PM

## 2015-01-31 NOTE — Progress Notes (Signed)
Subjective:  No complaints this AM. Admitted 12/28 for abd pain/ perf colon s/p surg. Colostomy and perc drain. AFIB with CVR  Objective:  Temp:  [97.7 F (36.5 C)-98.8 F (37.1 C)] 97.9 F (36.6 C) (01/14 0814) Pulse Rate:  [61-96] 84 (01/14 0814) Resp:  [18-20] 18 (01/14 0814) BP: (91-117)/(51-75) 117/66 mmHg (01/14 0814) SpO2:  [92 %-100 %] 97 % (01/14 0400) Weight:  [287 lb 4.8 oz (130.318 kg)] 287 lb 4.8 oz (130.318 kg) (01/14 0400) Weight change: -3 lb 12.8 oz (-1.724 kg)  Intake/Output from previous day: 01/13 0701 - 01/14 0700 In: 2415 [P.O.:1920; I.V.:345; IV Piggyback:150] Out: 3251 [Urine:2425; Drains:350; Stool:476]  Intake/Output from this shift: Total I/O In: -  Out: 440 [Urine:440]  Physical Exam: General appearance: alert and no distress Neck: no adenopathy, no carotid bruit, no JVD, supple, symmetrical, trachea midline and thyroid not enlarged, symmetric, no tenderness/mass/nodules Lungs: clear to auscultation bilaterally Heart: irregularly irregular rhythm Extremities: extremities normal, atraumatic, no cyanosis or edema  Lab Results: Results for orders placed or performed during the hospital encounter of 01/14/15 (from the past 48 hour(s))  Glucose, capillary     Status: Abnormal   Collection Time: 01/29/15 11:33 AM  Result Value Ref Range   Glucose-Capillary 132 (H) 65 - 99 mg/dL   Comment 1 Notify RN    Comment 2 Document in Chart   Culture, routine-abscess     Status: None (Preliminary result)   Collection Time: 01/29/15  3:25 PM  Result Value Ref Range   Specimen Description ABSCESS ABDOMEN    Special Requests NONE    Gram Stain      ABUNDANT WBC PRESENT,BOTH PMN AND MONONUCLEAR NO SQUAMOUS EPITHELIAL CELLS SEEN ABUNDANT GRAM POSITIVE COCCI IN PAIRS IN CHAINS IN CLUSTERS ABUNDANT GRAM NEGATIVE RODS FEW GRAM POSITIVE RODS    Culture      MODERATE GRAM NEGATIVE RODS Performed at [facility]    Report Status PENDING     Glucose, capillary     Status: Abnormal   Collection Time: 01/29/15  5:08 PM  Result Value Ref Range   Glucose-Capillary 173 (H) 65 - 99 mg/dL   Comment 1 Notify RN    Comment 2 Document in Chart   Glucose, capillary     Status: Abnormal   Collection Time: 01/29/15  9:39 PM  Result Value Ref Range   Glucose-Capillary 197 (H) 65 - 99 mg/dL  CBC     Status: Abnormal   Collection Time: 01/30/15  2:30 AM  Result Value Ref Range   WBC 9.4 4.0 - 10.5 K/uL   RBC 4.16 (L) 4.22 - 5.81 MIL/uL   Hemoglobin 12.8 (L) 13.0 - 17.0 g/dL   HCT 39.6 39.0 - 52.0 %   MCV 95.2 78.0 - 100.0 fL   MCH 30.8 26.0 - 34.0 pg   MCHC 32.3 30.0 - 36.0 g/dL   RDW 15.4 11.5 - 15.5 %   Platelets 349 150 - 400 K/uL  Protime-INR     Status: Abnormal   Collection Time: 01/30/15  2:30 AM  Result Value Ref Range   Prothrombin Time 15.6 (H) 11.6 - 15.2 seconds   INR 1.22 0.00 - 5.18  Basic metabolic panel     Status: Abnormal   Collection Time: 01/30/15  2:30 AM  Result Value Ref Range   Sodium 139 135 - 145 mmol/L   Potassium 3.4 (L) 3.5 - 5.1 mmol/L   Chloride 88 (L) 101 - 111 mmol/L  CO2 43 (H) 22 - 32 mmol/L   Glucose, Bld 145 (H) 65 - 99 mg/dL   BUN 21 (H) 6 - 20 mg/dL   Creatinine, Ser 0.96 0.61 - 1.24 mg/dL   Calcium 8.0 (L) 8.9 - 10.3 mg/dL   GFR calc non Af Amer >60 >60 mL/min   GFR calc Af Amer >60 >60 mL/min    Comment: (NOTE) The eGFR has been calculated using the CKD EPI equation. This calculation has not been validated in all clinical situations. eGFR's persistently <60 mL/min signify possible Chronic Kidney Disease.    Anion gap 8 5 - 15  Heparin level (unfractionated)     Status: Abnormal   Collection Time: 01/30/15  2:30 AM  Result Value Ref Range   Heparin Unfractionated <0.10 (L) 0.30 - 0.70 IU/mL    Comment:        IF HEPARIN RESULTS ARE BELOW EXPECTED VALUES, AND PATIENT DOSAGE HAS BEEN CONFIRMED, SUGGEST FOLLOW UP TESTING OF ANTITHROMBIN III LEVELS.   Glucose, capillary      Status: Abnormal   Collection Time: 01/30/15  7:57 AM  Result Value Ref Range   Glucose-Capillary 125 (H) 65 - 99 mg/dL  Heparin level (unfractionated)     Status: Abnormal   Collection Time: 01/30/15  9:42 AM  Result Value Ref Range   Heparin Unfractionated <0.10 (L) 0.30 - 0.70 IU/mL    Comment:        IF HEPARIN RESULTS ARE BELOW EXPECTED VALUES, AND PATIENT DOSAGE HAS BEEN CONFIRMED, SUGGEST FOLLOW UP TESTING OF ANTITHROMBIN III LEVELS. REPEATED TO VERIFY   Glucose, capillary     Status: Abnormal   Collection Time: 01/30/15 12:01 PM  Result Value Ref Range   Glucose-Capillary 189 (H) 65 - 99 mg/dL  Glucose, capillary     Status: Abnormal   Collection Time: 01/30/15  4:23 PM  Result Value Ref Range   Glucose-Capillary 172 (H) 65 - 99 mg/dL   Comment 1 Notify RN    Comment 2 Document in Chart   Heparin level (unfractionated)     Status: Abnormal   Collection Time: 01/30/15  6:53 PM  Result Value Ref Range   Heparin Unfractionated <0.10 (L) 0.30 - 0.70 IU/mL    Comment:        IF HEPARIN RESULTS ARE BELOW EXPECTED VALUES, AND PATIENT DOSAGE HAS BEEN CONFIRMED, SUGGEST FOLLOW UP TESTING OF ANTITHROMBIN III LEVELS.   Glucose, capillary     Status: Abnormal   Collection Time: 01/30/15  8:27 PM  Result Value Ref Range   Glucose-Capillary 123 (H) 65 - 99 mg/dL  CBC     Status: Abnormal   Collection Time: 01/31/15  1:37 AM  Result Value Ref Range   WBC 9.2 4.0 - 10.5 K/uL   RBC 3.88 (L) 4.22 - 5.81 MIL/uL   Hemoglobin 12.3 (L) 13.0 - 17.0 g/dL   HCT 37.8 (L) 39.0 - 52.0 %   MCV 97.4 78.0 - 100.0 fL   MCH 31.7 26.0 - 34.0 pg   MCHC 32.5 30.0 - 36.0 g/dL   RDW 15.3 11.5 - 15.5 %   Platelets 310 150 - 400 K/uL  Heparin level (unfractionated)     Status: Abnormal   Collection Time: 01/31/15  1:37 AM  Result Value Ref Range   Heparin Unfractionated 0.11 (L) 0.30 - 0.70 IU/mL    Comment:        IF HEPARIN RESULTS ARE BELOW EXPECTED VALUES, AND PATIENT DOSAGE HAS BEEN  CONFIRMED,  SUGGEST FOLLOW UP TESTING OF ANTITHROMBIN III LEVELS.   Basic metabolic panel     Status: Abnormal   Collection Time: 01/31/15  1:37 AM  Result Value Ref Range   Sodium 140 135 - 145 mmol/L   Potassium 3.5 3.5 - 5.1 mmol/L   Chloride 90 (L) 101 - 111 mmol/L   CO2 38 (H) 22 - 32 mmol/L   Glucose, Bld 178 (H) 65 - 99 mg/dL   BUN 15 6 - 20 mg/dL   Creatinine, Ser 0.78 0.61 - 1.24 mg/dL   Calcium 8.0 (L) 8.9 - 10.3 mg/dL   GFR calc non Af Amer >60 >60 mL/min   GFR calc Af Amer >60 >60 mL/min    Comment: (NOTE) The eGFR has been calculated using the CKD EPI equation. This calculation has not been validated in all clinical situations. eGFR's persistently <60 mL/min signify possible Chronic Kidney Disease.    Anion gap 12 5 - 15  Glucose, capillary     Status: Abnormal   Collection Time: 01/31/15  7:56 AM  Result Value Ref Range   Glucose-Capillary 130 (H) 65 - 99 mg/dL    Imaging: Imaging results have been reviewed Mild congestion ? Small pleural effusions (I personally reviewed)  Tele- AFIB with CVR  Assessment/Plan:   1. Active Problems: 2.   Diverticulitis of colon with perforation 3.   Hypoxia 4.   Atrial fibrillation with rapid ventricular response ([place]) 5.   Systolic dysfunction with acute on chronic heart failure ([place]) 6.   Perforated bowel ([place]) 7.   Respiratory failure ([place]) 8.   Dyspnea 9.   Pleural effusion 10.   Pulmonary edema 11.   Pressure ulcer 12.   Tachypnea 13.   Bowel perforation ([place]) 14.   Intra-abdominal abscess ([place]) 15.   CHF (congestive heart failure) ([place]) 16.   Time Spent Directly with Patient:  20 minutes  Length of Stay:  LOS: 17 days   Clinically improving from colon perf/ surg/ perc drain. I/O neg 15 Liters on PO lasix. No CP/SOB/ ABD pain. On IV hep. Afib rate controlled on BB/CCB/dig (rate prob was elevated secondary to stress of surg, pain etc). When DC home, stop iv Hep and put back on Pradaxa. Can consolidate Dilt  to CD 120 mg daily. Follow up with Dr. Rayann Heman. Will see again Monday.  Quay Burow 01/31/2015, 11:05 AM

## 2015-01-31 NOTE — Progress Notes (Signed)
RT note: Pt. not using BiPAP V-60, last used 01/28/15@0445 , pulled, RN @ bedside, pt. alert, in no apparent distress, RT to monitor.

## 2015-01-31 NOTE — Progress Notes (Signed)
Pt had bath himself today  without problem. Pt drain flushed with 20 cc NSS.

## 2015-01-31 NOTE — Progress Notes (Signed)
MD note today indicates that patient will d/c home with home health this coming week. He has a wound vac in place per note.  [facility] has declined patient. CSW services will continue to monitor but it appears that d/c home is indicated for this patient. Should d/c decision change to SNF- [facility] is only bed offer and can accept patient. Would need to order wound vac if indicated.  Thanks! Lorie Phenix. Pauline Good, [place]   (weekend coverage)

## 2015-02-01 LAB — BASIC METABOLIC PANEL
ANION GAP: 6 (ref 5–15)
BUN: 9 mg/dL (ref 6–20)
CALCIUM: 8.1 mg/dL — AB (ref 8.9–10.3)
CO2: 41 mmol/L — ABNORMAL HIGH (ref 22–32)
Chloride: 91 mmol/L — ABNORMAL LOW (ref 101–111)
Creatinine, Ser: 0.75 mg/dL (ref 0.61–1.24)
Glucose, Bld: 121 mg/dL — ABNORMAL HIGH (ref 65–99)
Potassium: 3.9 mmol/L (ref 3.5–5.1)
Sodium: 138 mmol/L (ref 135–145)

## 2015-02-01 LAB — CBC
HCT: 40.1 % (ref 39.0–52.0)
Hemoglobin: 12.9 g/dL — ABNORMAL LOW (ref 13.0–17.0)
MCH: 31.4 pg (ref 26.0–34.0)
MCHC: 32.2 g/dL (ref 30.0–36.0)
MCV: 97.6 fL (ref 78.0–100.0)
PLATELETS: 325 10*3/uL (ref 150–400)
RBC: 4.11 MIL/uL — AB (ref 4.22–5.81)
RDW: 15.2 % (ref 11.5–15.5)
WBC: 7.8 10*3/uL (ref 4.0–10.5)

## 2015-02-01 LAB — CULTURE, ROUTINE-ABSCESS

## 2015-02-01 LAB — HEPARIN LEVEL (UNFRACTIONATED)
HEPARIN UNFRACTIONATED: 0.71 [IU]/mL — AB (ref 0.30–0.70)
HEPARIN UNFRACTIONATED: 0.34 [IU]/mL (ref 0.30–0.70)
Heparin Unfractionated: 0.8 IU/mL — ABNORMAL HIGH (ref 0.30–0.70)

## 2015-02-01 LAB — GLUCOSE, CAPILLARY: GLUCOSE-CAPILLARY: 106 mg/dL — AB (ref 65–99)

## 2015-02-01 NOTE — Progress Notes (Signed)
ANTICOAGULATION CONSULT NOTE - Follow Up Consult  Pharmacy Consult for heparin Indication: atrial fibrillation   Labs:  Recent Labs  01/30/15 0230  01/31/15 0137  02/01/15 0520 02/01/15 1305 02/01/15 2211  HGB 12.8*  --  12.3*  --  12.9*  --   --   HCT 39.6  --  37.8*  --  40.1  --   --   PLT 349  --  310  --  325  --   --   LABPROT 15.6*  --   --   --   --   --   --   INR 1.22  --   --   --   --   --   --   HEPARINUNFRC <0.10*  < > 0.11*  < > 0.71* 0.80* 0.34  CREATININE 0.96  --  0.78  --  0.75  --   --   < > = values in this interval not displayed.   Assessment/Plan:  50yo male therapeutic on heparin after rate decrease. Will continue gtt at current rate and confirm stable with am labs.   Wynona Neat, PharmD, BCPS  02/01/2015,11:12 PM

## 2015-02-01 NOTE — Progress Notes (Signed)
ANTICOAGULATION CONSULT NOTE - Follow Up Consult  Pharmacy Consult for Heparin Indication: atrial fibrillation  Allergies  Allergen Reactions  . Contrast Media [Iodinated Diagnostic Agents] Rash    a diffuse macular rash after CTA chest  Pt was premedicated with 125mg  IV Solumedrol, 50mg  IV Benadryl 1 hr prior to CTexam, and tolerated procedure without any difficulties.    Patient Measurements: Height: 5\' 11"  (180.3 cm) Weight: 281 lb 1.6 oz (127.506 kg) IBW/kg (Calculated) : 75.3 Heparin Dosing Weight: 105 kg  Vital Signs: Temp: 97.8 F (36.6 C) (01/15 1332) Temp Source: Oral (01/15 1332) BP: 99/54 mmHg (01/15 1332) Pulse Rate: 66 (01/15 1332)  Labs:  Recent Labs  01/30/15 0230  01/31/15 0137 01/31/15 1235 02/01/15 0520 02/01/15 1305  HGB 12.8*  --  12.3*  --  12.9*  --   HCT 39.6  --  37.8*  --  40.1  --   PLT 349  --  310  --  325  --   LABPROT 15.6*  --   --   --   --   --   INR 1.22  --   --   --   --   --   HEPARINUNFRC <0.10*  < > 0.11* 0.67 0.71* 0.80*  CREATININE 0.96  --  0.78  --  0.75  --   < > = values in this interval not displayed.  Estimated Creatinine Clearance: 155.3 mL/min (by C-G formula based on Cr of 0.75).  Assessment:   Heparin level is supratherapeutic (0.80) on 3400 units/hr.  Level up after rate decrease early this morning. RN reports IV site changed this morning due to issue with prior site.    IV heparin resumed on 1/12 pm after drain placement.   INR down to 1.22 on 1/13.  Coumadin discontinued on 1/11, received Vitamin K 10 mg PO on 1/11.   Had been on Pradaxa at home.    Goal of Therapy:  Heparin level 0.3-0.7 units/ml Monitor platelets by anticoagulation protocol: Yes   Plan:   Decrease heparin drip to 3200 units/hr  Heparin level ~6 hrs after rate change.  Daily heparin level and CBC while on heparin.  Noted plan to resume Pradaxa at discharge.  Arty Baumgartner, [place] Pager: [phone] 02/01/2015,3:38 PM

## 2015-02-01 NOTE — Progress Notes (Signed)
ANTICOAGULATION CONSULT NOTE - Follow Up Consult  Pharmacy Consult for Heparin Indication: atrial fibrillation  Allergies  Allergen Reactions  . Contrast Media [Iodinated Diagnostic Agents] Rash    a diffuse macular rash after CTA chest  Pt was premedicated with 125mg  IV Solumedrol, 50mg  IV Benadryl 1 hr prior to CTexam, and tolerated procedure without any difficulties.    Patient Measurements: Height: 5\' 11"  (180.3 cm) Weight: 281 lb 1.6 oz (127.506 kg) IBW/kg (Calculated) : 75.3 Heparin Dosing Weight: 105 kg  Vital Signs: Temp: 97.4 F (36.3 C) (01/15 0400) BP: 108/68 mmHg (01/15 0400) Pulse Rate: 111 (01/15 0400)  Labs:  Recent Labs  01/30/15 0230  01/31/15 0137 01/31/15 1235 02/01/15 0520  HGB 12.8*  --  12.3*  --  12.9*  HCT 39.6  --  37.8*  --  40.1  PLT 349  --  310  --  325  LABPROT 15.6*  --   --   --   --   INR 1.22  --   --   --   --   HEPARINUNFRC <0.10*  < > 0.11* 0.67 0.71*  CREATININE 0.96  --  0.78  --   --   < > = values in this interval not displayed.  Estimated Creatinine Clearance: 155.3 mL/min (by C-G formula based on Cr of 0.78).  Assessment: Heparin level slightly supratherapeutic (0.71) on 3500 units/hr. CBC stable. No bleeding noted.    Goal of Therapy:  Heparin level 0.3-0.7 units/ml Monitor platelets by anticoagulation protocol: Yes   Plan:  Decrease heparin drip to 3400 units/hr. F/u 6 hr heparin level  Sherlon Handing, PharmD, BCPS Clinical pharmacist, pager [phone] 02/01/2015,6:25 AM

## 2015-02-01 NOTE — Progress Notes (Signed)
Subjective: Pt feeling better this morning. Pain well controlled, was able to get out of bed and wash himself by the sink.   Objective: Vital signs in last 24 hours: Filed Vitals:   01/31/15 2152 01/31/15 2153 02/01/15 0007 02/01/15 0400  BP: 97/64 97/64 101/56 108/68  Pulse: 101  89 111  Temp:   98.2 F (36.8 C) 97.4 F (36.3 C)  TempSrc:      Resp:   22 24  Height:      Weight:    281 lb 1.6 oz (127.506 kg)  SpO2:   94% 97%   Weight change: -6 lb 3.2 oz (-2.812 kg)  Intake/Output Summary (Last 24 hours) at 02/01/15 0951 Last data filed at 02/01/15 0739  Gross per 24 hour  Intake   1360 ml  Output   1675 ml  Net   -315 ml   Physical Exam General: Lying in bed, NAD Cardiovascular: Regular rhythm, no m/r/g Pulmonary: clear to anterior auscultation. No crackles today. No respiratory distress Abdominal: Colostomy bag in place draining brown fluid . Percutnaneous drain in place, draining light brown fluid. Midline surgical site w/ wound vac. Wound site clean and dry. No abdominal tenderness.  Extremities: No clubbing, cyanosis, or edema. Psychiatric: Normal behavior and affect  Lab Results: Basic Metabolic Panel:  Recent Labs Lab 01/31/15 0137 02/01/15 0520  NA 140 138  K 3.5 3.9  CL 90* 91*  CO2 38* 41*  GLUCOSE 178* 121*  BUN 15 9  CREATININE 0.78 0.75  CALCIUM 8.0* 8.1*   CBC:  Recent Labs Lab 01/31/15 0137 02/01/15 0520  WBC 9.2 7.8  HGB 12.3* 12.9*  HCT 37.8* 40.1  MCV 97.4 97.6  PLT 310 325    CBG:  Recent Labs Lab 01/30/15 2027 01/31/15 0756 01/31/15 1116 01/31/15 1655 01/31/15 2045 02/01/15 0732  GLUCAP 123* 130* 195* 121* 236* 106*   Coagulation:  Recent Labs Lab 01/27/15 0341 01/28/15 0442 01/29/15 0420 01/30/15 0230  LABPROT 35.5* 34.1* 16.7* 15.6*  INR 3.66* 3.47* 1.34 1.22   Micro Results: Recent Results (from the past 240 hour(s))  Surgical pcr screen     Status: None   Collection Time: 01/28/15  3:52 PM    Result Value Ref Range Status   MRSA, PCR NEGATIVE NEGATIVE Final   Staphylococcus aureus NEGATIVE NEGATIVE Final    Comment:        The Xpert SA Assay (FDA approved for NASAL specimens in patients over 40 years of age), is one component of a comprehensive surveillance program.  Test performance has been validated by [facility] for patients greater than or equal to 51 year old. It is not intended to diagnose infection nor to guide or monitor treatment.   Culture, blood (routine x 2)     Status: None (Preliminary result)   Collection Time: 01/28/15  8:12 PM  Result Value Ref Range Status   Specimen Description BLOOD RIGHT HAND  Final   Special Requests IN PEDIATRIC BOTTLE 4.5CC  Final   Culture NO GROWTH 3 DAYS  Final   Report Status PENDING  Incomplete  Culture, blood (routine x 2)     Status: None (Preliminary result)   Collection Time: 01/28/15  8:24 PM  Result Value Ref Range Status   Specimen Description BLOOD LEFT HAND  Final   Special Requests IN PEDIATRIC BOTTLE 6CC  Final   Culture NO GROWTH 3 DAYS  Final   Report Status PENDING  Incomplete  Culture, routine-abscess  Status: None   Collection Time: 01/29/15  3:25 PM  Result Value Ref Range Status   Specimen Description ABSCESS ABDOMEN  Final   Special Requests NONE  Final   Gram Stain   Final    ABUNDANT WBC PRESENT,BOTH PMN AND MONONUCLEAR NO SQUAMOUS EPITHELIAL CELLS SEEN ABUNDANT GRAM POSITIVE COCCI IN PAIRS IN CHAINS IN CLUSTERS ABUNDANT GRAM NEGATIVE RODS FEW GRAM POSITIVE RODS    Culture   Final    MODERATE ESCHERICHIA COLI Performed at [facility]    Report Status 02/01/2015 FINAL  Final   Organism ID, Bacteria ESCHERICHIA COLI  Final      Susceptibility   Escherichia coli - MIC*    AMPICILLIN >=32 RESISTANT Resistant     AMPICILLIN/SULBACTAM >=32 RESISTANT Resistant     CEFEPIME <=1 SENSITIVE Sensitive     CEFTAZIDIME <=1 SENSITIVE Sensitive     CEFTRIAXONE <=1 SENSITIVE Sensitive      CIPROFLOXACIN <=0.25 SENSITIVE Sensitive     GENTAMICIN <=1 SENSITIVE Sensitive     IMIPENEM <=0.25 SENSITIVE Sensitive     PIP/TAZO <=4 SENSITIVE Sensitive     TOBRAMYCIN <=1 SENSITIVE Sensitive     TRIMETH/SULFA Value in next row Sensitive      <=20 SENSITIVE(NOTE)    * MODERATE ESCHERICHIA COLI   Studies/Results: No results found. Medications: I have reviewed the patient's current medications. Scheduled Meds: . antiseptic oral rinse  7 mL Mouth Rinse BID  . digoxin  0.125 mg Oral Daily  . diltiazem  120 mg Oral Daily  . feeding supplement  1 Container Oral TID BM  . furosemide  40 mg Oral Daily  . Gerhardt's butt cream   Topical Daily  . metoprolol tartrate  150 mg Oral BID  . oxyCODONE  20 mg Oral Q12H  . pantoprazole  40 mg Oral Q1200  . piperacillin-tazobactam (ZOSYN)  IV  3.375 g Intravenous 3 times per day   Continuous Infusions: . 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  - oxycontin 20mg  BID scheduled for pain- goal of relief of pain to 4/10. Oxycodone 5mg  q4h prn for breakthrough pain.  - Levaquin (1/1 - 1/4 discontinued), Meropenem 1 mg q8h (1/1-1/9), Zosyn (1/11 - ),   - Will transition to Augmentin at outpatient to complete 14-day course preceded by Zosyn (final dose on 1/25) - CT abd schedule for Monday Atrial Fibrillation with RVR: HR still fluctuating ranging from 59-111 but for the most part controlled, cardiology following.  - Lopressor 150 mg BID - cardizem CD 120mg  - digoxin 0.125 mg po daily  Hypoxia secondary to pulmonary edema: improving, net negative 15.3L since admission. no longer requiring  BIPAP. On 3L [place] - lasix 40 mg po daily - BiPAP prn - Wean O2 as tolerated - Atrovent and Xopenex nebulizer q2h prn  Anxiety: Ativan 0.5 mg IV BID  DVT prophylaxis: hep gtt, will d/c home on pradaxa   Dispo: Disposition is deferred at this time, awaiting improvement of current medical problems.   The patient does have a current PCP Thompson Grayer, MD) and does not need an [facility] hospital follow-up appointment after discharge. Will set pt up with IMTS clinic for follow up.   The patient does have  transportation limitations that hinder transportation to clinic appointments.  .Services Needed at time of discharge: Y = Yes, Blank = No PT:   OT:   RN:   Equipment:   Other:     LOS: 18 days   Norman Herrlich, MD 02/01/2015, 9:51 AM

## 2015-02-01 NOTE — Progress Notes (Signed)
CM called and spoke with Ria Comment with [place] who will page dispatch for someone to deliver wound vac. CM called Tiffany with [facility] to advise of patient being discharged home with [facility] services for [facility]. Information accepted. CM remains available for any further discharge planning needs.

## 2015-02-01 NOTE — Progress Notes (Signed)
Report updated in patient's room via Rey RN using [employer], updated on new orders, current VS, rhythm, and updated POC, assumed care of patient.

## 2015-02-01 NOTE — Progress Notes (Signed)
CM called [facility] and Ria Comment to advise of patient being discharged home. Awaiting return call.

## 2015-02-01 NOTE — Progress Notes (Signed)
Patient ID: William Fischer, male   DOB: Jun 07, 1965, 50 y.o.   MRN: [id number]  [facility]  HD#: 15  Subjective: Patient comfortable, in good spirits.  Wants to go home.  Objective: Vital signs in last 24 hours: Temp:  [97.4 F (36.3 C)-98.2 F (36.8 C)] 97.4 F (36.3 C) (01/15 0400) Pulse Rate:  [59-111] 111 (01/15 0400) Resp:  [18-28] 24 (01/15 0400) BP: (97-163)/(51-96) 108/68 mmHg (01/15 0400) SpO2:  [93 %-98 %] 97 % (01/15 0400) Weight:  [127.506 kg (281 lb 1.6 oz)] 127.506 kg (281 lb 1.6 oz) (01/15 0400) Last BM Date: 01/31/15  Intake/Output from previous day: 01/14 0701 - 01/15 0700 In: 1360 [P.O.:840; I.V.:420; IV Piggyback:100] Out: 1485 [Urine:1140; Drains:345] Intake/Output this shift: Total I/O In: -  Out: 400 [Urine:400]  Physical Exam: HEENT - sclerae clear, mucous membranes moist Neck - soft Chest - clear bilaterally Cor - RRR Abdomen - obese, midline wound with VAC intact; soft stool in bag as ostomy right mid abd wall; drain with thin brown purulent output, some gas in drainage bag Ext - no edema, non-tender Neuro - alert & oriented, no focal deficits  Lab Results:   Recent Labs  01/31/15 0137 02/01/15 0520  WBC 9.2 7.8  HGB 12.3* 12.9*  HCT 37.8* 40.1  PLT 310 325   BMET  Recent Labs  01/31/15 0137 02/01/15 0520  NA 140 138  K 3.5 3.9  CL 90* 91*  CO2 38* 41*  GLUCOSE 178* 121*  BUN 15 9  CREATININE 0.78 0.75  CALCIUM 8.0* 8.1*   PT/INR  Recent Labs  01/30/15 0230  LABPROT 15.6*  INR 1.22   Comprehensive Metabolic Panel:    Component Value Date/Time   NA 138 02/01/2015 0520   NA 140 01/31/2015 0137   K 3.9 02/01/2015 0520   K 3.5 01/31/2015 0137   CL 91* 02/01/2015 0520   CL 90* 01/31/2015 0137   CO2 41* 02/01/2015 0520   CO2 38* 01/31/2015 0137   BUN 9 02/01/2015 0520   BUN 15 01/31/2015 0137   CREATININE 0.75 02/01/2015 0520   CREATININE 0.78 01/31/2015 0137   GLUCOSE 121*  02/01/2015 0520   GLUCOSE 178* 01/31/2015 0137   CALCIUM 8.1* 02/01/2015 0520   CALCIUM 8.0* 01/31/2015 0137   AST 47* 01/19/2015 0445   AST 22 01/14/2015 0906   ALT 26 01/19/2015 0445   ALT 21 01/14/2015 0906   ALKPHOS 67 01/19/2015 0445   ALKPHOS 85 01/14/2015 0906   BILITOT 1.1 01/19/2015 0445   BILITOT 1.2 01/14/2015 0906   PROT 4.9* 01/19/2015 0445   PROT 6.7 01/14/2015 0906   ALBUMIN 1.4* 01/19/2015 0445   ALBUMIN 2.6* 01/14/2015 0906    Studies/Results: No results found.  Anti-infectives: Anti-infectives    Start     Dose/Rate Route Frequency Ordered Stop   01/28/15 1500  piperacillin-tazobactam (ZOSYN) IVPB 3.375 g     3.375 g 12.5 mL/hr over 240 Minutes Intravenous 3 times per day 01/28/15 1409     01/18/15 0400  levofloxacin (LEVAQUIN) IVPB 750 mg  Status:  Discontinued     750 mg 100 mL/hr over 90 Minutes Intravenous Every 24 hours 01/18/15 0210 01/21/15 1108   01/18/15 0215  meropenem (MERREM) 1 g in sodium chloride 0.9 % 100 mL IVPB  Status:  Discontinued     1 g 200 mL/hr over 30 Minutes Intravenous 3 times per day 01/18/15 0210 01/26/15 0822   01/16/15 0930  ertapenem (INVANZ) 1 g in sodium chloride 0.9 % 50 mL IVPB  Status:  Discontinued     1 g 100 mL/hr over 30 Minutes Intravenous Every 24 hours 01/16/15 0841 01/18/15 0203   01/14/15 1245  piperacillin-tazobactam (ZOSYN) IVPB 3.375 g  Status:  Discontinued     3.375 g 12.5 mL/hr over 240 Minutes Intravenous 3 times per day 01/14/15 1238 01/16/15 0841   01/14/15 1230  ciprofloxacin (CIPRO) IVPB 400 mg  Status:  Discontinued     400 mg 200 mL/hr over 60 Minutes Intravenous  Once 01/14/15 1218 01/14/15 1238   01/14/15 1230  metroNIDAZOLE (FLAGYL) IVPB 500 mg  Status:  Discontinued     500 mg 100 mL/hr over 60 Minutes Intravenous  Once 01/14/15 1218 01/14/15 1238      Assessment & Plans: 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 Office: [place] 02/01/2015

## 2015-02-02 ENCOUNTER — Other Ambulatory Visit: Payer: Self-pay | Admitting: Radiology

## 2015-02-02 DIAGNOSIS — T814XXD Infection following a procedure, subsequent encounter: Secondary | ICD-10-CM

## 2015-02-02 DIAGNOSIS — K651 Peritoneal abscess: Secondary | ICD-10-CM

## 2015-02-02 DIAGNOSIS — K631 Perforation of intestine (nontraumatic): Secondary | ICD-10-CM

## 2015-02-02 DIAGNOSIS — IMO0001 Reserved for inherently not codable concepts without codable children: Secondary | ICD-10-CM

## 2015-02-02 LAB — CBC
HEMATOCRIT: 38.6 % — AB (ref 39.0–52.0)
Hemoglobin: 12.1 g/dL — ABNORMAL LOW (ref 13.0–17.0)
MCH: 30.4 pg (ref 26.0–34.0)
MCHC: 31.3 g/dL (ref 30.0–36.0)
MCV: 97 fL (ref 78.0–100.0)
Platelets: 323 10*3/uL (ref 150–400)
RBC: 3.98 MIL/uL — ABNORMAL LOW (ref 4.22–5.81)
RDW: 14.9 % (ref 11.5–15.5)
WBC: 7.8 10*3/uL (ref 4.0–10.5)

## 2015-02-02 LAB — CULTURE, BLOOD (ROUTINE X 2)
CULTURE: NO GROWTH
CULTURE: NO GROWTH

## 2015-02-02 LAB — HEPARIN LEVEL (UNFRACTIONATED): Heparin Unfractionated: 0.38 IU/mL (ref 0.30–0.70)

## 2015-02-02 MED ORDER — METOPROLOL TARTRATE 75 MG PO TABS: 150.0000 mg | ORAL_TABLET | Freq: Two times a day (BID) | ORAL | Status: DC

## 2015-02-02 MED ORDER — OXYCODONE HCL ER 20 MG PO T12A: 20.0000 mg | EXTENDED_RELEASE_TABLET | Freq: Two times a day (BID) | ORAL | Status: DC

## 2015-02-02 MED ORDER — FUROSEMIDE 40 MG PO TABS: 40.0000 mg | ORAL_TABLET | Freq: Every day | ORAL | Status: DC

## 2015-02-02 MED ORDER — DIGOXIN 125 MCG PO TABS: 0.1250 mg | ORAL_TABLET | Freq: Every day | ORAL | Status: DC

## 2015-02-02 MED ORDER — OXYCODONE HCL 5 MG PO TABS: 5.0000 mg | ORAL_TABLET | ORAL | Status: DC | PRN

## 2015-02-02 MED ORDER — BOOST / RESOURCE BREEZE PO LIQD: 1.0000 | Freq: Three times a day (TID) | ORAL | Status: DC

## 2015-02-02 MED ORDER — DILTIAZEM HCL ER COATED BEADS 120 MG PO CP24
120.0000 mg | ORAL_CAPSULE | Freq: Every day | ORAL | Status: DC
Start: 2015-02-02 — End: 2015-02-09

## 2015-02-02 MED ORDER — CEFDINIR 300 MG PO CAPS: 300.0000 mg | ORAL_CAPSULE | Freq: Two times a day (BID) | ORAL | Status: DC

## 2015-02-02 NOTE — Progress Notes (Signed)
ANTICOAGULATION CONSULT NOTE - Follow Up Consult  Pharmacy Consult for Heparin Indication: atrial fibrillation Allergies  Allergen Reactions  . Contrast Media [Iodinated Diagnostic Agents] Rash    a diffuse macular rash after CTA chest  Pt was premedicated with 125mg  IV Solumedrol, 50mg  IV Benadryl 1 hr prior to CTexam, and tolerated procedure without any difficulties.  Patient Measurements: Height: 5\' 11"  (180.3 cm) Weight: 271 lb 1.6 oz (122.97 kg) IBW/kg (Calculated) : 75.3 Heparin Dosing Weight: 105 kg Vital Signs: Temp: 98.1 F (36.7 C) (01/16 0745) Temp Source: Oral (01/16 0745) BP: 112/71 mmHg (01/16 0745) Pulse Rate: 92 (01/16 0745) Labs:  Recent Labs  01/31/15 0137  02/01/15 0520 02/01/15 1305 02/01/15 2211 02/02/15 0228  HGB 12.3*  --  12.9*  --   --  12.1*  HCT 37.8*  --  40.1  --   --  38.6*  PLT 310  --  325  --   --  323  HEPARINUNFRC 0.11*  < > 0.71* 0.80* 0.34 0.38  CREATININE 0.78  --  0.75  --   --   --   < > = values in this interval not displayed.  Estimated Creatinine Clearance: 152.4 mL/min (by C-G formula based on Cr of 0.75).  Assessment: 50 year old male on Pradaxa prior to admission for atrial fibrillation now on IV heparin 2/2 abdominal surgery.   Heparin level is therapeutic on 3200 units/hr.  CBC is stable. No bleeding reported.   Goal of Therapy:  Heparin level 0.3-0.7 units/ml Monitor platelets by anticoagulation protocol: Yes   Plan:  Continue heparin drip to 3200 units/hr. Daily heparin level and CBC while on heparin. Noted plan to resume Pradaxa at discharge.  Sloan Leiter, PharmD, BCPS Clinical Pharmacist [phone]  02/02/2015,8:33 AM

## 2015-02-02 NOTE — Progress Notes (Addendum)
PT Cancellation Note  Patient Details Name: William Fischer MRN: [id number] DOB: Oct 22, 1965   Cancelled Treatment:     Pt deferred PT this am as he says he is waiting to go home.    Donato Heinz. Owens Shark, PT   02/02/2015, 12:25 PM

## 2015-02-02 NOTE — Progress Notes (Signed)
Subjective: Pt continues to feel better and says he has his strength back. Denies dyspnea and abdominal pain.   Objective: Vital signs in last 24 hours: Filed Vitals:   02/02/15 0046 02/02/15 0300 02/02/15 0745 02/02/15 1119  BP: 103/77 111/74 112/71 73/53  Pulse: 95 92 92 96  Temp: 98.6 F (37 C) 98.3 F (36.8 C) 98.1 F (36.7 C) 98.1 F (36.7 C)  TempSrc: Oral Oral Oral Oral  Resp:  18 18 18   Height:      Weight:  271 lb 1.6 oz (122.97 kg)    SpO2: 96% 96% 96% 94%   Weight change: -10 lb (-4.536 kg)  Intake/Output Summary (Last 24 hours) at 02/02/15 1135 Last data filed at 02/02/15 1120  Gross per 24 hour  Intake    360 ml  Output   2075 ml  Net  -1715 ml   Physical Exam General: Lying in bed, NAD Cardiovascular: Irregularly irregular rhythm with normal rate, no m/r/g Pulmonary: clear to anterior auscultation in anterior fields. No crackles. No respiratory distress Abdominal: Colostomy bag in place draining brown fluid . Percutnaneous drain in place, draining light brown fluid. Midline surgical site w/ wound vac. Wound site clean and dry. No abdominal tenderness. Normal bowel sounds Extremities: No clubbing, cyanosis, or edema. Psychiatric: Normal behavior and affect  Lab Results: Basic Metabolic Panel:  Recent Labs Lab 01/31/15 0137 02/01/15 0520  NA 140 138  K 3.5 3.9  CL 90* 91*  CO2 38* 41*  GLUCOSE 178* 121*  BUN 15 9  CREATININE 0.78 0.75  CALCIUM 8.0* 8.1*   CBC:  Recent Labs Lab 02/01/15 0520 02/02/15 0228  WBC 7.8 7.8  HGB 12.9* 12.1*  HCT 40.1 38.6*  MCV 97.6 97.0  PLT 325 323    CBG:  Recent Labs Lab 01/30/15 2027 01/31/15 0756 01/31/15 1116 01/31/15 1655 01/31/15 2045 02/01/15 0732  GLUCAP 123* 130* 195* 121* 236* 106*   Coagulation:  Recent Labs Lab 01/27/15 0341 01/28/15 0442 01/29/15 0420 01/30/15 0230  LABPROT 35.5* 34.1* 16.7* 15.6*  INR 3.66* 3.47* 1.34 1.22   Micro Results: Recent Results (from the  past 240 hour(s))  Surgical pcr screen     Status: None   Collection Time: 01/28/15  3:52 PM  Result Value Ref Range Status   MRSA, PCR NEGATIVE NEGATIVE Final   Staphylococcus aureus NEGATIVE NEGATIVE Final    Comment:        The Xpert SA Assay (FDA approved for NASAL specimens in patients over 71 years of age), is one component of a comprehensive surveillance program.  Test performance has been validated by [facility] for patients greater than or equal to 29 year old. It is not intended to diagnose infection nor to guide or monitor treatment.   Culture, blood (routine x 2)     Status: None   Collection Time: 01/28/15  8:12 PM  Result Value Ref Range Status   Specimen Description BLOOD RIGHT HAND  Final   Special Requests IN PEDIATRIC BOTTLE 4.5CC  Final   Culture NO GROWTH 5 DAYS  Final   Report Status 02/02/2015 FINAL  Final  Culture, blood (routine x 2)     Status: None   Collection Time: 01/28/15  8:24 PM  Result Value Ref Range Status   Specimen Description BLOOD LEFT HAND  Final   Special Requests IN PEDIATRIC BOTTLE [facility]  Final   Culture NO GROWTH 5 DAYS  Final   Report Status 02/02/2015 FINAL  Final  Culture, routine-abscess     Status: None   Collection Time: 01/29/15  3:25 PM  Result Value Ref Range Status   Specimen Description ABSCESS ABDOMEN  Final   Special Requests NONE  Final   Gram Stain   Final    ABUNDANT WBC PRESENT,BOTH PMN AND MONONUCLEAR NO SQUAMOUS EPITHELIAL CELLS SEEN ABUNDANT GRAM POSITIVE COCCI IN PAIRS IN CHAINS IN CLUSTERS ABUNDANT GRAM NEGATIVE RODS FEW GRAM POSITIVE RODS    Culture   Final    MODERATE ESCHERICHIA COLI Performed at [facility]    Report Status 02/01/2015 FINAL  Final   Organism ID, Bacteria ESCHERICHIA COLI  Final      Susceptibility   Escherichia coli - MIC*    AMPICILLIN >=32 RESISTANT Resistant     AMPICILLIN/SULBACTAM >=32 RESISTANT Resistant     CEFEPIME <=1 SENSITIVE Sensitive     CEFTAZIDIME <=1  SENSITIVE Sensitive     CEFTRIAXONE <=1 SENSITIVE Sensitive     CIPROFLOXACIN <=0.25 SENSITIVE Sensitive     GENTAMICIN <=1 SENSITIVE Sensitive     IMIPENEM <=0.25 SENSITIVE Sensitive     PIP/TAZO <=4 SENSITIVE Sensitive     TOBRAMYCIN <=1 SENSITIVE Sensitive     TRIMETH/SULFA Value in next row Sensitive      <=20 SENSITIVE(NOTE)    * MODERATE ESCHERICHIA COLI   Studies/Results: No results found. Medications: I have reviewed the patient's current medications. Scheduled Meds: . antiseptic oral rinse  7 mL Mouth Rinse BID  . digoxin  0.125 mg Oral Daily  . diltiazem  120 mg Oral Daily  . feeding supplement  1 Container Oral TID BM  . furosemide  40 mg Oral Daily  . Gerhardt's butt cream   Topical Daily  . metoprolol tartrate  150 mg Oral BID  . oxyCODONE  20 mg Oral Q12H  . pantoprazole  40 mg Oral Q1200  . piperacillin-tazobactam (ZOSYN)  IV  3.375 g Intravenous 3 times per day   Continuous Infusions: . 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 Oxycodone 5mg  q4h prn for breakthrough pain, has not needed any prns in past 24 hours - Levaquin (1/1 - 1/4 discontinued), Meropenem 1 mg q8h (1/1-1/9), Zosyn (1/11 - ),   - Will transition to Cefdinir as outpatient to complete 14-day course preceded by Zosyn (final dose on 1/25) - CT abdomen deferred given 24h output still >30cc and no symptoms  Atrial Fibrillation with RVR: HR still fluctuating ranging from 59-111 but for the most part controlled, cardiology following.  - Lopressor 150 mg BID - cardizem CD 120mg  -  digoxin 0.125 mg po daily  Hypoxia secondary to pulmonary edema: improving, net negative 18.6L since admission. no longer requiring BIPAP. On 3L [place] - lasix 40 mg po daily - BiPAP prn - Wean O2 as tolerated - Atrovent and Xopenex nebulizer q2h prn  Anxiety: Ativan 0.5 mg IV BID  DVT prophylaxis: hep gtt, will d/c home on pradaxa   Dispo: Disposition is deferred at this time, awaiting improvement of current medical problems.   The patient does have a current PCP Thompson Grayer, MD) and does not need an  [facility] hospital follow-up appointment after discharge. Will set pt up with IMTS clinic for follow up.   The patient does have transportation limitations that hinder transportation to clinic appointments.  .Services Needed at time of discharge: Y = Yes, Blank = No PT:   OT:   RN:   Equipment:   Other:     LOS: 19 days   Liberty Handy, MD 02/02/2015, 11:35 AM

## 2015-02-02 NOTE — Progress Notes (Signed)
16 Days Post-Op  Subjective: No complaints. No n/v. Pain controlled. Afebrile. 125cc/24hr out of perc drain  Objective: Vital signs in last 24 hours: Temp:  [97.8 F (36.6 C)-98.6 F (37 C)] 98.1 F (36.7 C) (01/16 0745) Pulse Rate:  [66-103] 92 (01/16 0745) Resp:  [18-24] 18 (01/16 0745) BP: (99-123)/(53-77) 112/71 mmHg (01/16 0745) SpO2:  [95 %-97 %] 96 % (01/16 0745) Weight:  [122.97 kg (271 lb 1.6 oz)] 122.97 kg (271 lb 1.6 oz) (01/16 0300) Last BM Date: 01/31/15  Intake/Output from previous day: 01/15 0701 - 01/16 0700 In: 360 [P.O.:360] Out: [id number] [Urine:3425; Drains:125; Stool:450] Intake/Output this shift:    Alert, nontoxic, medicine team at bedside cta irreg Obese, soft, colostomy viable, stool in bag. Wound vac in midline and intact Perc drain - thick tan fluid, no enteric contents No edema  Lab Results:   Recent Labs  02/01/15 0520 02/02/15 0228  WBC 7.8 7.8  HGB 12.9* 12.1*  HCT 40.1 38.6*  PLT 325 323   BMET  Recent Labs  01/31/15 0137 02/01/15 0520  NA 140 138  K 3.5 3.9  CL 90* 91*  CO2 38* 41*  GLUCOSE 178* 121*  BUN 15 9  CREATININE 0.78 0.75  CALCIUM 8.0* 8.1*   PT/INR No results for input(s): LABPROT, INR in the last 72 hours. ABG No results for input(s): PHART, HCO3 in the last 72 hours.  Invalid input(s): PCO2, PO2  Studies/Results: No results found.  Anti-infectives: Anti-infectives    Start     Dose/Rate Route Frequency Ordered Stop   01/28/15 1500  piperacillin-tazobactam (ZOSYN) IVPB 3.375 g     3.375 g 12.5 mL/hr over 240 Minutes Intravenous 3 times per day 01/28/15 1409     01/18/15 0400  levofloxacin (LEVAQUIN) IVPB 750 mg  Status:  Discontinued     750 mg 100 mL/hr over 90 Minutes Intravenous Every 24 hours 01/18/15 0210 01/21/15 1108   01/18/15 0215  meropenem (MERREM) 1 g in sodium chloride 0.9 % 100 mL IVPB  Status:  Discontinued     1 g 200 mL/hr over 30 Minutes Intravenous 3 times per day 01/18/15 0210  01/26/15 0822   01/16/15 0930  ertapenem (INVANZ) 1 g in sodium chloride 0.9 % 50 mL IVPB  Status:  Discontinued     1 g 100 mL/hr over 30 Minutes Intravenous Every 24 hours 01/16/15 0841 01/18/15 0203   01/14/15 1245  piperacillin-tazobactam (ZOSYN) IVPB 3.375 g  Status:  Discontinued     3.375 g 12.5 mL/hr over 240 Minutes Intravenous 3 times per day 01/14/15 1238 01/16/15 0841   01/14/15 1230  ciprofloxacin (CIPRO) IVPB 400 mg  Status:  Discontinued     400 mg 200 mL/hr over 60 Minutes Intravenous  Once 01/14/15 1218 01/14/15 1238   01/14/15 1230  metroNIDAZOLE (FLAGYL) IVPB 500 mg  Status:  Discontinued     500 mg 100 mL/hr over 60 Minutes Intravenous  Once 01/14/15 1218 01/14/15 1238      Assessment/Plan: s/p Procedure(s): EXPLORATORY LAPAROTOMY WITH LEFT COLECTOMY AND COLOSTOMY (N/A) 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 He is still putting out a large amount of fluid from drain. He is afebrile. He has no WBC. He is tolerating a diet. No enteric contents in drain. Discussed with Dr Reesa Chew who agrees that it is probably best to hold  off on CT. Radiology PA to arrange f/u CT in IR drain clinic. Pt states his contrast allergy is a rash only.   Ok for [employer] to home with [facility] services (wound vac, ostomy, drain) from my standpoint Cont wound vac Pt to keep drain journal Transition to oral abx - see micro results- discussed with medicine team F/u with Dr Donne Hazel at [facility] F/u with IR drain clinic  Leighton Ruff. Redmond Pulling, MD, FACS General, Bariatric, & Minimally Invasive Surgery [facility] [place]    LOS: 19 days    Gayland Curry 02/02/2015

## 2015-02-02 NOTE — Discharge Summary (Signed)
Name: William Fischer MRN: [id number] DOB: Dec 04, 1965 50 y.o. PCP: Thompson Grayer, MD  Date of Admission: 01/14/2015  9:03 AM Date of Discharge: 02/02/2015 Attending Physician: Aldine Contes, MD Discharge Diagnosis: 1. Diverticulitis of colon with perforation 2. Intra-abdominal Abscess 3. Atrial Fibrillation with RVR 4. Acute Hypoxic Respiratory Failure 5. Pulmonary Edema  Discharge Medications:   Medication List    STOP taking these medications        spironolactone 25 MG tablet  Commonly known as:  ALDACTONE      TAKE these medications        cefdinir 300 MG capsule  Commonly known as:  OMNICEF  Take 1 capsule (300 mg total) by mouth 2 (two) times daily. Last dose on 02/11/15     digoxin 0.125 MG tablet  Commonly known as:  LANOXIN  Take 1 tablet (0.125 mg total) by mouth daily.     diltiazem 120 MG 24 hr capsule  Commonly known as:  CARDIZEM CD  Take 1 capsule (120 mg total) by mouth daily.     feeding supplement Liqd  Take 1 Container by mouth 3 (three) times daily between meals.     furosemide 40 MG tablet  Commonly known as:  LASIX  Take 1 tablet (40 mg total) by mouth daily.     Metoprolol Tartrate 75 MG Tabs  Take 150 mg by mouth 2 (two) times daily.     oxyCODONE 20 mg 12 hr tablet  Commonly known as:  OXYCONTIN  Take 1 tablet (20 mg total) by mouth every 12 (twelve) hours as needed.     PRADAXA 150 MG Caps capsule  Generic drug:  dabigatran  TAKE 1 CAPSULE BY MOUTH EVERY 12 HOURS        Disposition and follow-up:   William Fischer was discharged from [facility] in stable condition.  At the hospital follow up visit please address:  1. Patient needs ongoing management of his wound vac at home and at his surgery follow-up appointment.  2. He needs a CT scan of his abdomen to assess for resolution of his intra-abdominal abscess.  3. He needs management of his percutaneous drain at his Interventional Radiology follow-up  appointment  4. His needs for rate control should be addressed on his follow-up cardiology appoiintment  5.  Labs / imaging needed at time of follow-up: None  6.  Pending labs/ test needing follow-up: CT Abdomen  Follow-up Appointments:     Follow-up Information    Follow up with [facility], MD. Schedule an appointment as soon as possible for a visit on 02/13/2015.   Specialty:  General Surgery   Why:  arrive by 10:40am for paperwork for an 11:10am appointment time   Contact information:   [place] [address] [phone]       Follow up with [facility] On 02/09/2015.   Specialty:  Cardiology   Why:  at [facility] information:   [id number] [place] [phone]      Follow up with [facility] On 02/12/2015.   Why:  Arrive at 9:45. Please pick up your oral contrast prep at least 1 day prior to your appointment. Liquids only 4 hours prior to your exam. Any medications can be taken as usual. Drain clinic visit to follow. Contact  [phone] for questions   Contact information:   [facility]  Discharge Instructions: Discharge Instructions    Diet - low sodium heart healthy    Complete by:  As directed      Face-to-face encounter (required for Medicare/Medicaid patients)    Complete by:  As directed   I Liberty Handy certify that this patient is under my care and that I, or a nurse practitioner or physician's assistant working with me, had a face-to-face encounter that meets the physician face-to-face encounter requirements with this patient on 02/02/2015. The encounter with the patient was in whole, or in part for the following medical condition(s) which is the primary reason for home health care (List medical condition): Abdominal perforation. Needs assistance with maintenance of wound vac.  The encounter with the patient was in whole, or in part, for  the following medical condition, which is the primary reason for home health care:  Colonic Perforation  I certify that, based on my findings, the following services are medically necessary home health services:  Nursing  Reason for Medically Necessary Home Health Services:  Skilled Nursing- Post-Surgical Wound Assessment and Care  My clinical findings support the need for the above services:  OTHER SEE COMMENTS  Further, I certify that my clinical findings support that this patient is homebound due to:  Ambulates short distances less than 300 feet     Home Health    Complete by:  As directed   To provide the following care/treatments:  RN     Increase activity slowly    Complete by:  As directed           William Fischer, it was a pleasure taking care of you in the hospital. You were here for a perforation in your colon (a hole in your colon). After your surgery, you had an infection in abdomen that was treated with a drain and antibiotics. You will follow up with the heart doctors on January 23, the drain doctors on January 26 after your repeat CAT scan, and the surgeons on January 27. The times are described in the "Follow-up" section of your discharge paperwork.  We are discharging you on an antibiotic called Cefdinir. You will take it twice a day until January 25.   You have a new medication called Cardizem which you will take once daily. You also have a new medication, digoxin 0.125 mg, which you will take once daily. Your metoprolol dose was increased to 150 mg twice a day. You may resume your Pradaxa at home. Your Lasix dose is higher at 40 mg daily now. You may stop taking spironolactone.   Should you develop a fever >100.4, new or worsening abdominal pain, you have difficulty breathing, please seek medical attention as soon as possible.  Consultations: Treatment Team:  [facility], MD [facility], MD  Procedures Performed:  Ct Abdomen Pelvis Wo Contrast  01/17/2015  CLINICAL  DATA:  Perforated bowel.  Followup. EXAM: CT ABDOMEN AND PELVIS WITHOUT CONTRAST TECHNIQUE: Multidetector CT imaging of the abdomen and pelvis was performed following the standard protocol without IV contrast. COMPARISON:  01/14/2015 FINDINGS: Lower chest: Increased left pleural effusion and left basilar consolidation. Increased right lower lobe atelectasis. Heart is mildly enlarged. Upper abdomen: Significant free intraperitoneal air is now noted. Gas now tracts within the mesentery and extends into the upper abdomen within the retroperitoneum. Small low-attenuation lesion within the posterior aspect of the liver, stable in appearance a not completely characterized measuring 1.0 cm. No focal abnormality within the spleen, pancreas, or kidneys. The adrenal glands are prominent  bilaterally without focal mass. Gallbladder is present. Gastrointestinal tract: The colon has a thickened wall beginning at the proximal descending colon, extending through the upper sigmoid colon. There are scattered diverticula. Significant gas is identified surrounding the descending and sigmoid colon, increased since prior study. The adjacent small bowel loops in the left lower quadrant have significant thickening of the wall. Pelvis: There is a new fluid collection in the pelvis measuring 5.5 x 6.4 cm. This is a amorphous and I doubt this has become an abscess. There is fluid within the sigmoid mesentery but this does not appear to be consolidated at this time. Retroperitoneum: No evidence for aortic aneurysm. No retroperitoneal or mesenteric adenopathy. Abdominal wall: Unremarkable. Osseous structures: Unremarkable. IMPRESSION: 1. Interval development of significant amount of free intraperitoneal air. 2. Gas and fluid within the sigmoid mesenteric have increased. 3. Fluid collection in the pelvis 6.4 cm without evidence for abscess. 4. Increased left pleural effusion, left basilar consolidation, and right lower lobe atelectasis. 5.  Prominent adrenal glands bilaterally, stable in appearance. 6. Critical Value/emergent results were discussed in person at the time of interpretation on 01/17/2015 at 1:30 pm to Dr. Liberty Handy. Electronically Signed   By: Nolon Nations M.D.   On: 01/17/2015 13:54   Dg Chest 2 View  01/29/2015  CLINICAL DATA:  Congestive heart failure EXAM: CHEST  2 VIEW COMPARISON:  01/27/2015 FINDINGS: Cardiomegaly again noted. Persistent mild congestion/ pulmonary edema. Bilateral small pleural effusion with bilateral basilar atelectasis or infiltrate. IMPRESSION: Persistent mild congestion/ pulmonary edema. Bilateral small pleural effusion with bilateral basilar atelectasis or infiltrate. Electronically Signed   By: Lahoma Crocker M.D.   On: 01/29/2015 18:41   Ct Chest W Contrast  01/28/2015  CLINICAL DATA:  Abscess.  Perforated diverticulitis. EXAM: CT CHEST, ABDOMEN, AND PELVIS WITH CONTRAST TECHNIQUE: Multidetector CT imaging of the chest, abdomen and pelvis was performed following the standard protocol during bolus administration of intravenous contrast. CONTRAST:  172mL OMNIPAQUE IOHEXOL 300 MG/ML  SOLN COMPARISON:  01/17/2015 FINDINGS: CT CHEST Mediastinum: The heart size is mildly enlarged. No pericardial effusion identified. The trachea appears patent and is midline. Unremarkable appearance of the esophagus. Multiple small lymph nodes are noted within the mediastinum. Likely reactive. No adenopathy identified. No supraclavicular or axillary adenopathy. Lungs/Pleura: There is a moderate volume left pleural effusion. A small loculated right pleural effusion is identified, image 85 of series 5. Atelectasis is noted within the right middle lobe and right lower lobe. There is airspace consolidation involving the left lower lobe. Musculoskeletal: No aggressive lytic or sclerotic bone lesions are identified. CT ABDOMEN AND PELVIS Hepatobiliary: There is a low density structure within the posterior right lobe of liver  measuring 11 mm, image 52 of series 2. This is unchanged from previous exam. The gallbladder is normal. No biliary dilatation. Pancreas: Negative Spleen: Negative Adrenals/Urinary Tract: The normal appearance of the adrenal glands. The kidneys are unremarkable. Urinary bladder is negative. Stomach/Bowel: Stomach has a normal caliber. There is no abnormal pathologic dilatation of the small bowel loops. The appendix is visualized and appears normal. The proximal scratch set there is a right sided colostomy. The proximal large bowel loops have a normal caliber. There is been dehiscence of the Hartman's pouch along the suture line. There is an associated large complex fluid collection involving the left side of abdomen measuring 13.5 x 8.3 by 30.9 cm. This extends into the upper abdomen an abuts the greater curvature of the stomach. There are multiple smaller fluid collections scratch  set there are smaller collections of gas and fluid identified elsewhere within the abdomen particularly along the left pericolic gutter and retroperitoneum and extending over the spleen. No significant fluid collections identified within the right side of abdomen. Vascular/Lymphatic: Normal appearance of the abdominal aorta. No enlarged retroperitoneal or mesenteric adenopathy. No enlarged pelvic or inguinal lymph nodes. Reproductive: The prostate gland and seminal vesicles appear normal. Other: Multiple fluid collections secondary to dehiscence of Hartman's pouch identified. Musculoskeletal: No aggressive lytic or sclerotic bone lesions. IMPRESSION: 1. Examination is significant for dehiscence of the Hartman's pouch along the suture line. There is a resultant large complex fluid collection involving much of the left hemi abdomen. Multiple smaller gas and fluid collections are identified scattered throughout the left abdomen especially within the left pericolic gutter and retroperitoneal space posterior to the left kidney. 2. Partially  loculated right pleural effusion. 3. Moderate left effusion with overlying airspace consolidation. Critical Value/emergent results were called by telephone at the time of interpretation on 01/28/2015 at 1:41 pm to Dr. Hulen Luster, who verbally acknowledged these results. Electronically Signed   By: Kerby Moors M.D.   On: 01/28/2015 13:41   Ct Abdomen Pelvis W Contrast  01/28/2015  CLINICAL DATA:  Abscess.  Perforated diverticulitis. EXAM: CT CHEST, ABDOMEN, AND PELVIS WITH CONTRAST TECHNIQUE: Multidetector CT imaging of the chest, abdomen and pelvis was performed following the standard protocol during bolus administration of intravenous contrast. CONTRAST:  169mL OMNIPAQUE IOHEXOL 300 MG/ML  SOLN COMPARISON:  01/17/2015 FINDINGS: CT CHEST Mediastinum: The heart size is mildly enlarged. No pericardial effusion identified. The trachea appears patent and is midline. Unremarkable appearance of the esophagus. Multiple small lymph nodes are noted within the mediastinum. Likely reactive. No adenopathy identified. No supraclavicular or axillary adenopathy. Lungs/Pleura: There is a moderate volume left pleural effusion. A small loculated right pleural effusion is identified, image 85 of series 5. Atelectasis is noted within the right middle lobe and right lower lobe. There is airspace consolidation involving the left lower lobe. Musculoskeletal: No aggressive lytic or sclerotic bone lesions are identified. CT ABDOMEN AND PELVIS Hepatobiliary: There is a low density structure within the posterior right lobe of liver measuring 11 mm, image 52 of series 2. This is unchanged from previous exam. The gallbladder is normal. No biliary dilatation. Pancreas: Negative Spleen: Negative Adrenals/Urinary Tract: The normal appearance of the adrenal glands. The kidneys are unremarkable. Urinary bladder is negative. Stomach/Bowel: Stomach has a normal caliber. There is no abnormal pathologic dilatation of the small bowel loops. The appendix  is visualized and appears normal. The proximal scratch set there is a right sided colostomy. The proximal large bowel loops have a normal caliber. There is been dehiscence of the Hartman's pouch along the suture line. There is an associated large complex fluid collection involving the left side of abdomen measuring 13.5 x 8.3 by 30.9 cm. This extends into the upper abdomen an abuts the greater curvature of the stomach. There are multiple smaller fluid collections scratch set there are smaller collections of gas and fluid identified elsewhere within the abdomen particularly along the left pericolic gutter and retroperitoneum and extending over the spleen. No significant fluid collections identified within the right side of abdomen. Vascular/Lymphatic: Normal appearance of the abdominal aorta. No enlarged retroperitoneal or mesenteric adenopathy. No enlarged pelvic or inguinal lymph nodes. Reproductive: The prostate gland and seminal vesicles appear normal. Other: Multiple fluid collections secondary to dehiscence of Hartman's pouch identified. Musculoskeletal: No aggressive lytic or sclerotic bone lesions. IMPRESSION: 1.  Examination is significant for dehiscence of the Hartman's pouch along the suture line. There is a resultant large complex fluid collection involving much of the left hemi abdomen. Multiple smaller gas and fluid collections are identified scattered throughout the left abdomen especially within the left pericolic gutter and retroperitoneal space posterior to the left kidney. 2. Partially loculated right pleural effusion. 3. Moderate left effusion with overlying airspace consolidation. Critical Value/emergent results were called by telephone at the time of interpretation on 01/28/2015 at 1:41 pm to Dr. Hulen Luster, who verbally acknowledged these results. Electronically Signed   By: Kerby Moors M.D.   On: 01/28/2015 13:41   Dg Chest Port 1 View  01/27/2015  CLINICAL DATA:  Respiratory distress. EXAM:  PORTABLE CHEST 1 VIEW COMPARISON:  January 26, 2015. FINDINGS: Stable cardiomegaly and central pulmonary vascular congestion is noted. Bilateral perihilar and basilar interstitial densities are noted concerning for edema. Stable bilateral pleural effusions are noted, with left greater than right. No pneumothorax is noted. IMPRESSION: Stable bilateral opacities most consistent with edema with associated pleural effusions. Electronically Signed   By: Marijo Conception, M.D.   On: 01/27/2015 21:19   Dg Chest Port 1 View  01/26/2015  CLINICAL DATA:  Pulmonary edema and shortness of breath EXAM: PORTABLE CHEST 1 VIEW COMPARISON:  Yesterday FINDINGS: Unchanged hazy appearance of the bilateral chest, left more than right, compatible with effusions. The left effusion is likely moderate volume. There is underlying symmetric interstitial and airspace opacity. Chronic cardiomegaly and stable vascular pedicle widening, accentuated by rightward rotation. No pneumothorax. IMPRESSION: Unchanged CHF pattern including left more than right pleural effusion. Electronically Signed   By: Monte Fantasia M.D.   On: 01/26/2015 11:29   Dg Chest Port 1 View  01/25/2015  CLINICAL DATA:  Acute onset of shortness of breath. EXAM: PORTABLE CHEST 1 VIEW COMPARISON:  Chest radiograph performed 01/23/2015 FINDINGS: Bilateral central airspace opacification is noted, with underlying vascular congestion and small to moderate bilateral pleural effusions, compatible with pulmonary edema. This appears worsened from the prior study. No pneumothorax is seen. The cardiomediastinal silhouette is enlarged. No acute osseous abnormalities are identified. IMPRESSION: Worsening pulmonary edema, with small to moderate bilateral pleural effusions. Underlying vascular congestion and cardiomegaly. Electronically Signed   By: Garald Balding M.D.   On: 01/25/2015 06:01   Dg Chest Port 1 View  01/23/2015  CLINICAL DATA:  Status post exploratory laparotomy and  subsequent colectomy and colostomy placement for perforated bowel secondary to diverticulitis; hypoxia, atrial fibrillation, acute and chronic CHF. EXAM: PORTABLE CHEST 1 VIEW COMPARISON:  Portable chest x-ray of January 21, 2015 FINDINGS: The lungs are mildly hypoinflated. The interstitial markings on the left have improved. The hemidiaphragms remain it obscured. The cardiac silhouette remains enlarged. The pulmonary vascularity is slightly less engorged centrally. The observed bony thorax is unremarkable. IMPRESSION: Interval improvement in the pulmonary interstitium consistent with decreasing interstitial edema. There is persistent cardiomegaly, pulmonary interstitial edema, and small bilateral pleural effusions. Electronically Signed   By: David  Martinique M.D.   On: 01/23/2015 07:41   Dg Chest Port 1 View  01/22/2015  CLINICAL DATA:  Dyspnea. EXAM: PORTABLE CHEST 1 VIEW COMPARISON:  01/19/2015 FINDINGS: Enteric tube is been removed. Tip of the right central line in the region of the proximal SVC. Low lung volumes again seen. Progressive bibasilar opacities, likely combination of pleural effusion and consolidation. Heart appears prominent in size. Vascular congestion suspected, not well assessed due to low lung volumes. IMPRESSION: Hypoventilatory chest with progressive  bibasilar opacities, likely combination of pleural effusion and consolidation. Consolidation may be related to atelectasis, aspiration, or pneumonia. Electronically Signed   By: Jeb Levering M.D.   On: 01/22/2015 00:02   Dg Chest Port 1 View  01/19/2015  CLINICAL DATA:  50 year old male with respiratory distress shortness of breath. EXAM: PORTABLE CHEST 1 VIEW COMPARISON:  Chest x-ray 01/17/2015. FINDINGS: Patient has been extubated. A nasogastric tube is seen extending into the stomach, however, the tip of the nasogastric tube extends below the lower margin of the image. There is a right-sided internal jugular central venous catheter with  tip terminating in the proximal superior vena cava. Lung volumes are slightly low. Widespread patchy asymmetrically distributed interstitial and airspace opacities throughout the lungs bilaterally, with relative sparing of the right upper lobe. Overall aeration is very similar to the prior study. Small right and moderate left pleural effusions. Pulmonary vasculature does not appear engorged. Heart size is mildly enlarged. Upper mediastinal contours are within normal limits. IMPRESSION: 1. Support apparatus, as above. 2. Patchy asymmetrically distributed interstitial and airspace opacities in the lungs bilaterally favored to reflect a multilobar pneumonia. 3. Mild cardiomegaly. Electronically Signed   By: Vinnie Langton M.D.   On: 01/19/2015 07:26   Dg Chest Port 1 View  01/17/2015  CLINICAL DATA:  Central line placement.  Initial encounter. EXAM: PORTABLE CHEST 1 VIEW COMPARISON:  Chest radiograph performed earlier today at 10:49 a.m. FINDINGS: The patient's endotracheal tube is seen ending 6 cm above the carina. The enteric tube is noted extending below the diaphragm. A right IJ line is noted ending about the proximal SVC. The lungs are well-aerated. Small bilateral pleural effusions noted. Vascular congestion is seen, with bilateral airspace opacification, left greater than right. This raises concern for mildly worsening asymmetric pulmonary edema. There is no evidence of pneumothorax. The cardiomediastinal silhouette is enlarged. No acute osseous abnormalities are seen. IMPRESSION: 1. Endotracheal tube seen ending 6 cm above the carina. 2. Right IJ line noted ending about the proximal SVC. 3. Small bilateral pleural effusions noted. Vascular congestion and cardiomegaly, with bilateral airspace opacification, left greater than right. This raises concern for mildly worsening asymmetric pulmonary edema. Electronically Signed   By: Garald Balding M.D.   On: 01/17/2015 21:41   Dg Chest Port 1  View  01/17/2015  CLINICAL DATA:  Dyspnea, atrial fibrillation, chronic heart failure and cardiomyopathy. EXAM: PORTABLE CHEST 1 VIEW COMPARISON:  01/15/2015 FINDINGS: Degree of pulmonary edema is mildly improved with moderate interstitial edema remaining. Component of left pleural fluid appears less prominent. Heart size also appears less prominent on the portable x-ray. IMPRESSION: Mildly improving pulmonary edema and diminished prominence of left pleural fluid. The heart size also appears less prominent by x-ray. Electronically Signed   By: Aletta Edouard M.D.   On: 01/17/2015 11:20   Dg Chest Port 1 View  01/15/2015  CLINICAL DATA:  Hypoxia. EXAM: PORTABLE CHEST 1 VIEW COMPARISON:  November 30, 2011. FINDINGS: Stable cardiomegaly is noted. Central pulmonary vascular congestion is noted with mild bilateral perihilar and basilar interstitial densities concerning for possible edema. No pneumothorax is noted. Probable mild left pleural effusion is noted. Bony thorax is unremarkable. IMPRESSION: Cardiomegaly and central pulmonary vascular congestion is noted with probable mild bilateral perihilar and basilar pulmonary edema. Probable mild left pleural effusion is noted. Electronically Signed   By: Marijo Conception, M.D.   On: 01/15/2015 10:16   Ct Renal Stone Study  01/14/2015  CLINICAL DATA:  Bilateral groin pain  for 5 days.  Hematuria. EXAM: CT ABDOMEN AND PELVIS WITHOUT CONTRAST TECHNIQUE: Multidetector CT imaging of the abdomen and pelvis was performed following the standard protocol without IV contrast. COMPARISON:  None. FINDINGS: Small left pleural effusion. Left lower lobe atelectasis or infiltrate. Cannot exclude pneumonia. Right lung bases clear. Heart is upper limits normal in size. Liver, spleen, pancreas, adrenals and kidneys have an unremarkable unenhanced appearance. Gallbladder unremarkable. There is extensive pelvic and left retroperitoneal gas. Inflammatory changes in the left  retroperitoneum and surrounding the descending colon and sigmoid colon. The gas appears to communicate with a diverticulum in the proximal sigmoid colon there is an area of wall thickening. Findings most compatible with perforated diverticulitis. Trace free fluid in the cul-de-sac. Stomach and small bowel and appendix are normal. Small umbilical hernia containing fat. Small bilateral inguinal hernias containing fat. No acute bony abnormality. IMPRESSION: Extensive gas throughout the left retroperitoneum with extensive stranding around the descending colon and sigmoid colon. This appears to be related to perforated diverticulitis in the proximal sigmoid colon. Small left pleural effusion. Left lower lobe atelectasis or infiltrate. Cannot exclude pneumonia. Electronically Signed   By: Rolm Baptise M.D.   On: 01/14/2015 12:10   Ct Image Guided Drainage By Percutaneous Catheter  01/29/2015  CLINICAL DATA:  50 year old male with a history of perioperative abscess. EXAM: CT GUIDED DRAINAGE OF ABDOMINAL ABSCESS ANESTHESIA/SEDATION: 1.5 Mg IV Versed 100 mcg IV Fentanyl Total Moderate Sedation Time:  15 minutes PROCEDURE: The procedure, risks, benefits, and alternatives were explained to the patient. Questions regarding the procedure were encouraged and answered. The patient understands and consents to the procedure. The LEFT LOWER ABDOMINAL WALL was prepped with CHLORHEXIDINEin a sterile fashion, and a sterile drape was applied covering the operative field. A sterile gown and sterile gloves were used for the procedure. Local anesthesia was provided with 1% Lidocaine. Once the patient was prepped and draped in the sterile fashion, the skin and subcutaneous tissues were generously infiltrated with 1% lidocaine for local anesthesia. A small stab incision was made with 11 blade scalpel, a Yueh needle was advanced under CT guidance into the gas and fluid collection of the left lower abdomen. Using modified Seldinger  technique, a 14 French drain was placed with serial dilation with 12 [place] and 14 [place] dilators. Pigtail catheter was formed, and aspiration of approximately were 1000 cc of feculent fluid was aspirated. Sample was sent to the lab. Catheter was sutured in position attached to gravity drainage. Patient tolerated the procedure well and remained hemodynamically stable throughout. No complications encountered and no significant blood loss encountered. COMPLICATIONS: None FINDINGS: CT demonstrates gas and fluid collection within the left lower quadrant. Decreased size status post drainage of approximately 1 L of fluid. No complicating features. IMPRESSION: Status post CT-guided drain placement into left abdominal abscess. Approximately 1000 cc of feculent fluid aspirated. Sample was sent to the lab for analysis. Signed, Dulcy Fanny. Earleen Newport, DO [facility] Electronically Signed   By: Corrie Mckusick D.O.   On: 01/29/2015 16:25    Admission HPI:   William Fischer is a 50 year old man with PMH of atrial fibrillation (CHA2DS2-VASc 2 on Pradaxa), CHF (EF 55-60%), and obesity who presents with a five day history of groin and LLQ pain. The pain started five days ago and came on suddenly. He cannot recall anything he was doing in particular at the time. He cannot identify and alleviating or aggravating factors, but does note that it  has been progressively worsening over the past five days, leading him to decide that he should come to the hospital. Associated symptoms are fever, chills, sweats, and racing heart rate. He's had some shortness of breath at night, denying orthopnea or PND. He has felt some non-radiating chest pain as well. He endorses some nausea, decreased appetite, and some difficulty urinating. He also describes to onset numbness in his left leg, and noted "some blood in his urine" while in the ED. Otherwise, he denies cough, vomiting, diarrhea,  constipation, melena, hematochezia, sore throat, loss of consciousness, light-headedness, new one-sided weakness, or confusion. He has never had a colonoscopy. He lives at home with his two sons, and his main contact his is his mother [name] [phone]). He does not smoke, drink, or use illicit substances. In the ED, A CT of the abdomen demonstrated a perforated sigmoid colon. He had a leukocytosis to 11.9, Cr1.95., and a +Nitrite, +Leukocytes. He was evaluated by surgery for a perforated sigmoid diverticulitis who did not recognize any indication for urgent surgery, recommending zosyn, bowel rest, and holding pradaxa.   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 He had an AKI initially to 1.95 that resolved. He was initially treated with IV zosyn (12/28-12/29) and ertapenem (12/30 - 1/1) prior to surgery for conservative management. However, his RVR became increasingly difficult to control and resulted in hypoxia (satting 90% on 3LNC) by way of pulmonary edema, seen on chest x-ray. Cardiology was consulted and agreed that his difficult to control RVR was driven by his underlying perforation. Therefore, a CT of the abdomen without contrast was obtained on 12/31 which demonstrated newly developed intra-peritoneal free air with fluid collection. Consequently, he was taken to surgery for a left hemicolectomy with [facility] pouch and a left colostomy. Immediately following, his antibiotics were switched to levaquin (1/1-1/4) and meropenem (1/1-1/9) in the ICU. He was extubated on 1/1, was weaned off pressors, and remained in the ICU until 1/6. On 1/6, he was noted to be dripping copious amounts of serosanguinous fluid through his wet-to-dry dressing and a wound vac was placed by surgery. William Fischer continued to RVR to the 170s and worsening pulmonary edema after being transferred to a floor bed. In light of this and his up-trending leukocytosis, a CT abdomen with contrast was obtained, revealing Hartman pouch dehiscence and a large complex fluid collection. Intervention radiology was consulted for the placement of a left percutaneous drain. From 1/11 to 1/16, it drained 2,445 mL of light brown fluid, with a 24 hour max of 1125 mL that slowly tapered down. With drainage, his RVR and hypoxia became easier to control. His antibiotics included IV zosyn after the abscess was identified which was transitioned to cefdinir 300 mg BID on discharge with the final dose on 1/25. Surgery followed him throughout the hospitalization and recommended an outpatient CT, scheduled on 12/26 and outpatient follow-up in surgery and IR drain clinic. By the end of his hospitalization his pain was adequately managed with q12h oxycodone 20 mg only.   Atrial Fibrillation with RVR (CHA2DS2-VASc 1) : Patient had only been on metoprolol 75 mg BID for rate control with pradaxa for anticoagulation. Because of his underlying sigmoid perforation and developing abscess, he was eventually titrated up to 150 mg metoprolol BID, diltiazem 90 mg TID, and digoxin 0.25 mg daily  with the assistance of Cardiology. Given bradycardia to the 50s after his drain was placed, this regimen was adjusted to 150 mg metoprolol BID, diltiazem 24h tablet 120 mg daily, and digoxin 0.125 mg daily. By day of discharge, he was intermittently in atrial fibrillation but his rates were consistently 60-100.  Acute Hypoxic Respiratory Failure Secondary to Pulmonary Edema: As noted above, William Fischer underlying perforation and abscess formation drove the RVR that led to pulmonary edema in the pre- and post-surgical course. This was managed with increasing doses of IV furosemide to a maximum dose of 80 mg BID. His requirements decreased as his RVR improved with  rate control and drainage of his intra-abdominal fluid. By discharge, he was on 40 mg furosemide po daily, twice his home furosemide dose. His spironolactone, not used during the hospitalization, was discontinued on discharge. His oxygen requirements after extubation on 1/1 reached a maximum of 6L and was ambulating comfortably on room air by discharge. He was net negative 18.6L by day of discharge from the beginning of his hospitalization.  Discharge Vitals:   BP 112/70 mmHg  Pulse 94  Temp(Src) 98.1 F (36.7 C) (Oral)  Resp 18  Ht 5\' 11"  (1.803 m)  Wt 271 lb 1.6 oz (122.97 kg)  BMI 37.83 kg/m2  SpO2 95%  Discharge Labs:  Results for orders placed or performed during the hospital encounter of 01/14/15 (from the past 24 hour(s))  Heparin level (unfractionated)     Status: None   Collection Time: 02/01/15 10:11 PM  Result Value Ref Range   Heparin Unfractionated 0.34 0.30 - 0.70 IU/mL  CBC     Status: Abnormal   Collection Time: 02/02/15  2:28 AM  Result Value Ref Range   WBC 7.8 4.0 - 10.5 K/uL   RBC 3.98 (L) 4.22 - 5.81 MIL/uL   Hemoglobin 12.1 (L) 13.0 - 17.0 g/dL   HCT 38.6 (L) 39.0 - 52.0 %   MCV 97.0 78.0 - 100.0 fL   MCH 30.4 26.0 - 34.0 pg   MCHC 31.3 30.0 - 36.0 g/dL   RDW 14.9 11.5 - 15.5 %   Platelets 323 150 - 400 K/uL  Heparin level (unfractionated)     Status: None   Collection Time: 02/02/15  2:28 AM  Result Value Ref Range   Heparin Unfractionated 0.38 0.30 - 0.70 IU/mL    Signed: Liberty Handy, MD 02/02/2015, 1:50 PM    Services Ordered on Discharge: [facility] for Management of Wound Vac

## 2015-02-02 NOTE — Discharge Instructions (Signed)
William Fischer, it was a pleasure taking care of you in the hospital. You were here for a perforation in your colon (a hole in your colon). After your surgery, you had an infection in abdomen that was treated with a drain and antibiotics. You will follow up with the heart doctors on January 23, the drain doctors on January 26 after your repeat CAT scan, and the surgeons on January 27. The times are described in the "Follow-up" section of your discharge paperwork.  We are discharging you on an antibiotic called Cefdinir. You will take it twice a day until January 25.   You have a new medication called Cardizem which you will take once daily. You also have a new medication, digoxin 0.125 mg, which you will take once daily. Your metoprolol dose was increased to 150 mg twice a day. You may resume your Pradaxa at home. Your Lasix dose is higher at 40 mg daily now. You may stop taking spironolactone.   Should you develop a fever >100.4, new or worsening abdominal pain, you have difficulty breathing, please seek medical attention as soon as possible.   You have an appointment set up with the [facility].  Multiple studies have shown that being followed by a dedicated atrial fibrillation clinic in addition to the standard care you receive from your other physicians improves health. We believe that enrollment in the atrial fibrillation clinic will allow [place] to better care for you.   The phone number to the [facility] is [phone]. The clinic is staffed Monday through Friday from 8:30am to 5pm.  Parking Directions: The clinic is located in the Heart and Vascular Building connected to [facility] 1)From [address] turn on to [address] and go to the 3rd entrance  (Heart and Vascular entrance) on the right. 2)Look to the right for Heart &Vascular Parking Garage. 3)A code for the entrance is required please call the clinic to receive this.   4)Take the elevators  to the 1st floor. Registration is in the room with the glass walls at the end of the hallway.  If you have any trouble parking or locating the clinic, please dont hesitate to call [phone].    [facility], [place] [phone]  OPEN ABDOMINAL SURGERY: POST OP INSTRUCTIONS  Always review your discharge instruction sheet given to you by the facility where your surgery was performed.  IF YOU HAVE DISABILITY OR FAMILY LEAVE FORMS, YOU MUST BRING THEM TO THE OFFICE FOR PROCESSING.  PLEASE DO NOT GIVE THEM TO YOUR DOCTOR.  1. A prescription for pain medication may be given to you upon discharge.  Take your pain medication as prescribed, if needed.  If narcotic pain medicine is not needed, then you may take acetaminophen (Tylenol) or ibuprofen (Advil) as needed. 2. Take your usually prescribed medications unless otherwise directed. 3. If you need a refill on your pain medication, please contact your pharmacy. They will contact our office to request authorization.  Prescriptions will not be filled after 5pm or on week-ends. 4. You should follow a light diet the first few days after arrival home, such as soup and crackers, pudding, etc.unless your doctor has advised otherwise. A high-fiber, low fat diet can be resumed as tolerated.   Be sure to include lots of fluids daily. Most patients will experience some swelling and bruising on the chest and neck area.  Ice packs will help.  Swelling and bruising can take several  days to resolve 5. Most patients will experience some swelling and bruising in the area of the incision. Ice pack will help. Swelling and bruising can take several days to resolve..  6. It is common to experience some constipation if taking pain medication after surgery.  Increasing fluid intake and taking a stool softener will usually help or prevent this problem from occurring.  A mild laxative (Milk of Magnesia or Miralax) should be taken according to package  directions if there are no bowel movements after 48 hours. 7.  You may have steri-strips (small skin tapes) in place directly over the incision.  These strips should be left on the skin for 7-10 days.  If your surgeon used skin glue on the incision, you may shower in 24 hours.  The glue will flake off over the next 2-3 weeks.  Any sutures or staples will be removed at the office during your follow-up visit. You may find that a light gauze bandage over your incision may keep your staples from being rubbed or pulled. You may shower and replace the bandage daily. 8. ACTIVITIES:  You may resume regular (light) daily activities beginning the next day--such as daily self-care, walking, climbing stairs--gradually increasing activities as tolerated.  You may have sexual intercourse when it is comfortable.  Refrain from any heavy lifting or straining until approved by your doctor. a. You may drive when you no longer are taking prescription pain medication, you can comfortably wear a seatbelt, and you can safely maneuver your car and apply brakes b. Return to Work: ___________________________________ 67. You should see your doctor in the office for a follow-up appointment approximately two weeks after your surgery.  Make sure that you call for this appointment within a day or two after you arrive home to insure a convenient appointment time. OTHER INSTRUCTIONS:  _____________________________________________________________ _____________________________________________________________  WHEN TO CALL YOUR DOCTOR: 1. Fever over 101.0 2. Inability to urinate 3. Nausea and/or vomiting 4. Extreme swelling or bruising 5. Continued bleeding from incision. 6. Increased pain, redness, or drainage from the incision. 7. Difficulty swallowing or breathing 8. Muscle cramping or spasms. 9. Numbness or tingling in hands or feet or around lips.  The clinic staff is available to answer your questions during regular business  hours.  Please dont hesitate to call and ask to speak to one of the nurses if you have concerns.  For further questions, please visit [email or web address]  Vacuum-Assisted Closure Therapy Vacuum-assisted closure (VAC) therapy uses a device that removes fluid and germs from wounds to help them heal. It is used on wounds that cannot be closed with stitches. They often heal slowly. Vacuum-assisted therapy helps the wound stay clean and healthy while the open wound slowly grows back together. Vacuum-assisted closure therapy uses a bandage (dressing) that is made of foam. It is put inside the wound. Then, a drape is placed over the wound. This drape sticks to your skin to keep air out, and to protect the wound. A tube is hooked up to a small pump and is attached to the drape. The pump sucks out the fluid and germs. Vacuum-assisted closure therapy can also help reduce the bad smell that comes from the wound. HOW DOES IT WORK?  The vacuum pump pulls fluid through the foam dressing. The dressing may wrinkle during this process. The fluid goes into the tube and away from the wound. The fluid then goes into a container. The fluid in the container must be replaced if it is full or  at least once a week, even if the container is not full. The pulling from the pump helps to close the wound and bring better circulation to the wound area. The foam dressing covers and protects the wound. It helps your wound heal faster.  HOW DOES IT FEEL?   You might feel a little pulling when the pump is on.  You might also feel a mild vibrating sensation.  You might feel some discomfort when the dressing is taken off. CAN I MOVE AROUND WITH VACUUM-ASSISTED CLOSURE THERAPY? Yes, it has a backup battery which is used when the machine is not plugged in, as long as the battery is working, you can move freely. WHAT ARE SOME THINGS I MUST KNOW?  Do not turn off the pump yourself, unless instructed to do so by your  healthcare provider, such as for bathing.  Do not take off the dressing yourself, unless instructed to do so by your caregiver.  You can wash or shower with the dressing. However, do not take the pump into the shower. Make sure the wound dressing is protected and covered with plastic. The wound area must stay dry.  Do not turn off the pump for more than 2 hours. If the pump is off for more than 2 hours, your nurse must change your dressing.  Check frequently that the machine is on, that the machine indicates the therapy is on, and that all clamps are open. THE ALARM IS SOUNDING! WHAT SHOULD I DO?   Stay calm.  Do not turn off the pump or do anything with the dressing.  Call your clinic or caregiver right away if the alarm goes off and you cannot fix the problem. Some reasons the alarm might go off include:  The fluid collection container is full.  The battery is low.  The dressing has a leak.  Explain to your caregiver what is happening. Follow the instructions you receive. WHEN SHOULD I CALL FOR HELP?   You have severe pain.  You have difficulty breathing.  You have bleeding that will not stop.  Your wound smells bad.  You have redness, swelling, or fluid leaking from your wound.  Your alarm goes off and you do not know what to do.  You have a fever.  Your wound itches severely.  Your dressing changes are often painful or bleeding often occurs.  You have diarrhea.  You have a sore throat.  You have a rash around the dressing or anywhere else on your body.  You feel nauseous.  You feel dizzy or weak.  The [facility] machine has been off for more than 2 hours. HOW DO I GET READY TO GO HOME WITH A PUMP?  A trained caregiver will talk to you and answer your questions about your vacuum-assisted closure therapy before you go home. He or she will explain what to expect. A caregiver will come to your home to apply the pump and care for your wound. The at-home caregiver will  be available for questions and will come back for the scheduled dressing changes, usually every 48-72 hours (or more often for severely infected wounds). Your at-home caregiver will also come if you are having an unexpected problem. If you have questions or do not know what to do when you go home, talk to your healthcare provider.   This information is not intended to replace advice given to you by your health care provider. Make sure you discuss any questions you have with your health care  provider.   Document Released: 12/17/2007 Document Revised: 09/05/2012 Document Reviewed: 12/17/2010 Elsevier Interactive Patient Education [employer].  If unable to do wound VAC then do normal saline wet to dry dressing changes BID (twice a day) Dressing Change A dressing is a material placed over wounds. It keeps the wound clean, dry, and protected from further injury. This provides an environment that favors wound healing.  BEFORE YOU BEGIN  Get your supplies together. Things you may need include:  Saline solution.  Flexible gauze dressing.  Medicated cream.  Tape.  Gloves.  Abdominal dressing pads.  Gauze squares.  Plastic bags.  Take pain medicine 30 minutes before the dressing change if you need it.  Take a shower before you do the first dressing change of the day. Use plastic wrap or a plastic bag to prevent the dressing from getting wet. REMOVING YOUR OLD DRESSING   Wash your hands with soap and water. Dry your hands with a clean towel.  Put on your gloves.  Remove any tape.  Carefully remove the old dressing. If the dressing sticks, you may dampen it with warm water to loosen it, or follow your caregiver's specific directions.  Remove any gauze or packing tape that is in your wound.  Take off your gloves.  Put the gloves, tape, gauze, or any packing tape into a plastic bag. CHANGING YOUR DRESSING  Open the supplies.  Take the cap off the saline solution.  Open  the gauze package so that the gauze remains on the inside of the package.  Put on your gloves.  Clean your wound as told by your caregiver.  If you have been told to keep your wound dry, follow those instructions.  Your caregiver may tell you to do one or more of the following:  Pick up the gauze. Pour the saline solution over the gauze. Squeeze out the extra saline solution.  Put medicated cream or other medicine on your wound if you have been told to do so.  Put the solution soaked gauze only in your wound, not on the skin around it.  Pack your wound loosely or as told by your caregiver.  Put dry gauze on your wound.  Put abdominal dressing pads over the dry gauze if your wet gauze soaks through.  Tape the abdominal dressing pads in place so they will not fall off. Do not wrap the tape completely around the affected part (arm, leg, abdomen).  Wrap the dressing pads with a flexible gauze dressing to secure it in place.  Take off your gloves. Put them in the plastic bag with the old dressing. Tie the bag shut and throw it away.  Keep the dressing clean and dry until your next dressing change.  Wash your hands. SEEK MEDICAL CARE IF:  Your skin around the wound looks red.  Your wound feels more tender or sore.  You see pus in the wound.  Your wound smells bad.  You have a fever.  Your skin around the wound has a rash that itches and burns.  You see black or yellow skin in your wound that was not there before.  You feel nauseous, throw up, and feel very tired.   This information is not intended to replace advice given to you by your health care provider. Make sure you discuss any questions you have with your health care provider.   Document Released: 02/11/2004 Document Revised: 03/28/2011 Document Reviewed: 11/15/2010 Elsevier Interactive Patient Education [employer].

## 2015-02-02 NOTE — Consult Note (Addendum)
WOC wound follow-up consult note Reason for Consult: VAC dressing change to midline abdominal wound.  Wound type: post-op full thickness surgical wound  Wound [id number] tissue, 85% red, 15% yellow interspersed throughout Measurement: 22X5X.3cm Drainage (amount, consistency, odor) mod amt yellow serosanguinous drainage, no odor Periwound: intact  Dressing procedure/placement/frequency: 1 pc of black foam used to fill the wound bed, sealed at 189mmHG cont suction. Pt tolerated well. Placed on home Vac machine for impending discharge today and discussed troubleshooting. [place] team will change Q M/W/F.  WOC ostomy consult note Reason for Consult: Pouch change demonstration performed for patient  Stoma red and viable, 50% above skin level, 50% flush with skin level from 6:00 o'clock to 11:00 o'clock, 1 1/4 inches  Applied barrier ring and 2 piece pouching appliance to maintain seal. Mod amt brown stool in pouch.  Pt assisted with pouch change using barrier ring and 2 piece pouching system. He was able to open and close the velcro. Supplies at the bedside for staff nurse use.  Reviewed pouching routines and ordering supplies. Placed on [place] discharge program: Yes Julien Girt MSN, [place], [place], [place], [place]

## 2015-02-02 NOTE — Progress Notes (Signed)
Pt ambulated to 200 feet tolerated well.

## 2015-02-02 NOTE — Progress Notes (Signed)
    SUBJECTIVE:  No chest pain.  No SOB.     PHYSICAL EXAM Filed Vitals:   02/01/15 2017 02/02/15 0046 02/02/15 0300 02/02/15 0745  BP: 123/62 103/77 111/74 112/71  Pulse: 103 95 92 92  Temp: 98.1 F (36.7 C) 98.6 F (37 C) 98.3 F (36.8 C) 98.1 F (36.7 C)  TempSrc: Oral Oral Oral Oral  Resp: 23  18 18   Height:      Weight:   271 lb 1.6 oz (122.97 kg)   SpO2: 95% 96% 96% 96%   General:  No distress Lungs:  Clear Heart:  Irregular Abdomen:  Positive bowel sounds, no rebound no guarding Extremities:  No edema  LABS:  Results for orders placed or performed during the hospital encounter of 01/14/15 (from the past 24 hour(s))  Heparin level (unfractionated)     Status: Abnormal   Collection Time: 02/01/15  1:05 PM  Result Value Ref Range   Heparin Unfractionated 0.80 (H) 0.30 - 0.70 IU/mL  Heparin level (unfractionated)     Status: None   Collection Time: 02/01/15 10:11 PM  Result Value Ref Range   Heparin Unfractionated 0.34 0.30 - 0.70 IU/mL  CBC     Status: Abnormal   Collection Time: 02/02/15  2:28 AM  Result Value Ref Range   WBC 7.8 4.0 - 10.5 K/uL   RBC 3.98 (L) 4.22 - 5.81 MIL/uL   Hemoglobin 12.1 (L) 13.0 - 17.0 g/dL   HCT 38.6 (L) 39.0 - 52.0 %   MCV 97.0 78.0 - 100.0 fL   MCH 30.4 26.0 - 34.0 pg   MCHC 31.3 30.0 - 36.0 g/dL   RDW 14.9 11.5 - 15.5 %   Platelets 323 150 - 400 K/uL  Heparin level (unfractionated)     Status: None   Collection Time: 02/02/15  2:28 AM  Result Value Ref Range   Heparin Unfractionated 0.38 0.30 - 0.70 IU/mL    Intake/Output Summary (Last 24 hours) at 02/02/15 0847 Last data filed at 02/02/15 0300  Gross per 24 hour  Intake    360 ml  Output   3650 ml  Net  -3290 ml     ASSESSMENT AND PLAN:  ATRIAL FIB WITH RVR:     Persistent atrial fib with controlled rate now.  On Cardizem CD.   To go home on Pradaxa.  I have arranged follow up in the atrial fib clinic.   ACUTE ON CHRONIC SYSTOLIC HF:  Seems to be euvolemic on PO  Lasix.    Jeneen Rinks [facility] 02/02/2015 8:47 AM

## 2015-02-09 ENCOUNTER — Encounter (HOSPITAL_COMMUNITY): Payer: Self-pay | Admitting: Nurse Practitioner

## 2015-02-09 ENCOUNTER — Ambulatory Visit (HOSPITAL_COMMUNITY)
Admit: 2015-02-09 | Discharge: 2015-02-09 | Disposition: A | Discharge status: Home / Self Care | Payer: Medicaid Other | Source: Ambulatory Visit | Attending: Nurse Practitioner | Admitting: Nurse Practitioner

## 2015-02-09 VITALS — BP 128/66 | HR 115 | Ht 70.0 in | Wt 274.8 lb

## 2015-02-09 DIAGNOSIS — Z7901 Long term (current) use of anticoagulants: Secondary | ICD-10-CM | POA: Diagnosis not present

## 2015-02-09 DIAGNOSIS — Z833 Family history of diabetes mellitus: Secondary | ICD-10-CM | POA: Insufficient documentation

## 2015-02-09 DIAGNOSIS — R0683 Snoring: Secondary | ICD-10-CM | POA: Insufficient documentation

## 2015-02-09 DIAGNOSIS — Z811 Family history of alcohol abuse and dependence: Secondary | ICD-10-CM | POA: Diagnosis not present

## 2015-02-09 DIAGNOSIS — I5022 Chronic systolic (congestive) heart failure: Secondary | ICD-10-CM | POA: Diagnosis not present

## 2015-02-09 DIAGNOSIS — F418 Other specified anxiety disorders: Secondary | ICD-10-CM | POA: Insufficient documentation

## 2015-02-09 DIAGNOSIS — Z8249 Family history of ischemic heart disease and other diseases of the circulatory system: Secondary | ICD-10-CM | POA: Insufficient documentation

## 2015-02-09 DIAGNOSIS — I482 Chronic atrial fibrillation, unspecified: Secondary | ICD-10-CM

## 2015-02-09 DIAGNOSIS — Z9049 Acquired absence of other specified parts of digestive tract: Secondary | ICD-10-CM | POA: Diagnosis not present

## 2015-02-09 DIAGNOSIS — I429 Cardiomyopathy, unspecified: Secondary | ICD-10-CM | POA: Insufficient documentation

## 2015-02-09 DIAGNOSIS — E669 Obesity, unspecified: Secondary | ICD-10-CM | POA: Insufficient documentation

## 2015-02-09 DIAGNOSIS — Z809 Family history of malignant neoplasm, unspecified: Secondary | ICD-10-CM | POA: Insufficient documentation

## 2015-02-09 DIAGNOSIS — F17210 Nicotine dependence, cigarettes, uncomplicated: Secondary | ICD-10-CM | POA: Insufficient documentation

## 2015-02-09 MED ORDER — DILTIAZEM HCL ER COATED BEADS 120 MG PO CP24: 120.0000 mg | ORAL_CAPSULE | Freq: Two times a day (BID) | ORAL | Status: DC

## 2015-02-09 NOTE — Progress Notes (Addendum)
Patient ID: William Fischer, male   DOB: 07/11/1965, 50 y.o.   MRN: [id number]     Primary Care Physician: Thompson Grayer, MD Referring Physician: [facility] f/u Electrophysiologist: Dr. Dianna Limbo is a 50 y.o. 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 He reports that he is slowly recovering his strength and he has not noticed any irregular heart beat but he does have a heart rate that is poorly controlled today at 115 bpm.   Today, he denies symptoms of palpitations, chest pain, shortness of breath, orthopnea, PND, lower extremity edema, dizziness, presyncope, syncope, or neurologic sequela. The patient is tolerating medications without difficulties and is otherwise without complaint today.   Past Medical History  Diagnosis Date  . Chicken pox as a child  . Migraine 06/29/2011  . Obesity 06/29/2011  . Depression with anxiety 06/29/2011  . Atrial fibrillation ([place])     admx 11/13 with acute sCHF in setting of RVR  => a. failed DCCV x 2; b. Pradaxa started;  c. failed sotalol  . Chronic systolic heart failure ([place])     a. echo 11/13: Ef 40-45%, diff HK, mod MR, mod LAE, mild RVE, mod RAE, small effusion;   b. TEE 11/13:  EF 35-40%, no LAA clot; Echo 2/14 shows normal EF  . 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 Snoring     patient needs sleep study - has declined  . Allergy to IVP dye   . Chronic anticoagulation     Pradaxa   Past Surgical History  Procedure Laterality Date  . Tee without cardioversion  11/30/2011    Procedure: TRANSESOPHAGEAL ECHOCARDIOGRAM (TEE);  Surgeon: Thayer Headings, MD;  Location: [place];  Service: Cardiovascular;  Laterality: N/A;  . Cardioversion   11/30/2011    Procedure: CARDIOVERSION;  Surgeon: Thayer Headings, MD;  Location: [place];  Service: Cardiovascular;  Laterality: N/A;  . Cardioversion  12/03/2011    Procedure: CARDIOVERSION;  Surgeon: Jolaine Artist, MD;  Location: [place];  Service: Cardiovascular;  Laterality: N/A;  . Laparotomy N/A 01/17/2015    Procedure: EXPLORATORY LAPAROTOMY WITH LEFT COLECTOMY AND COLOSTOMY;  Surgeon: Rolm Bookbinder, MD;  Location: [place];  Service: General;  Laterality: N/A;    Current Outpatient Prescriptions  Medication Sig Dispense Refill  . cefdinir (OMNICEF) 300 MG capsule Take 1 capsule (300 mg total) by mouth 2 (two) times daily. Last dose on 02/11/15 18 capsule 0  . digoxin (LANOXIN) 0.125 MG tablet Take 1 tablet (0.125 mg total) by mouth daily. 30 tablet 0  . diltiazem (CARDIZEM CD) 120 MG 24 hr capsule Take 1 capsule (120 mg total) by mouth 2 (two) times daily. 30 capsule 0  . feeding supplement (BOOST / RESOURCE BREEZE) LIQD Take 1 Container by mouth 3 (three) times daily between meals. 12 Container 0  . furosemide (LASIX) 40 MG tablet Take 1 tablet (40 mg total) by mouth daily. 30 tablet 3  . Metoprolol Tartrate 75 MG TABS Take 150 mg by mouth 2 (two) times daily. 60 tablet 0  . oxyCODONE (OXY IR/ROXICODONE) 5 MG immediate release tablet Take 1-2 tablets (5-10 mg total) by mouth every 4 (four) hours as needed for breakthrough pain. [place]  tablet 0  . PRADAXA 150 MG CAPS capsule TAKE 1 CAPSULE BY MOUTH EVERY 12 HOURS 60 capsule 10   No current facility-administered medications for this encounter.    Allergies  Allergen Reactions  . Contrast Media [Iodinated Diagnostic Agents] Rash    a diffuse macular rash after CTA chest  Pt was premedicated with 125mg  IV Solumedrol, 50mg  IV Benadryl 1 hr prior to CTexam, and tolerated procedure without any difficulties.    Social History   Social History  . Marital Status: Divorced    Spouse Name: N/A  . Number of Children: N/A  . Years  of Education: N/A   Occupational History  . Not on file.   Social History Main Topics  . Smoking status: Current Every Day Smoker -- 1.00 packs/day for 20 years    Types: Cigarettes  . Smokeless tobacco: Never Used  . Alcohol Use: No  . Drug Use: No  . Sexual Activity: No   Other Topics Concern  . Not on file   Social History Narrative    Family History  Problem Relation Age of Onset  . Hypertension Mother   . Hypertension Father   . Aneurysm Father   . Alcohol abuse Father   . Cancer Maternal Grandmother     brain  . Diabetes Maternal Grandfather     type 2  . Parkinsonism Maternal Grandfather   . Cancer Paternal Grandmother     breast  . Diabetes Paternal Grandmother   . Alcohol abuse Paternal Grandfather     ROS- All systems are reviewed and negative except as per the HPI above  Physical Exam: Filed Vitals:   02/09/15 1012  BP: 128/66  Pulse: 115  Height: 5\' 10"  (1.778 m)  Weight: 274 lb 12.8 oz (124.648 kg)    GEN- The patient is well appearing, alert and oriented x 3 today.   Head- normocephalic, atraumatic Eyes-  Sclera clear, conjunctiva pink Ears- hearing intact Oropharynx- clear Neck- supple, no JVP Lymph- no cervical lymphadenopathy Lungs- Clear to ausculation bilaterally, normal work of breathing Heart- Regular rate and rhythm, no murmurs, rubs or gallops, PMI not laterally displaced GI- soft, NT, ND, + BS Extremities- no clubbing, cyanosis, or edema MS- no significant deformity or atrophy Skin- no rash or lesion Psych- euthymic mood, full affect Neuro- strength and sensation are intact  EKG-Afib with rapid v response at 115 bpm, qrs int 70 ms, qtc 409 ms Epic records reviewed     Assessment and Plan: 1. Chronic afib with rvr probably secondary to recent infection and surgery Poorly controlled v rate, is asymptomatic but has had TMC in the past Increase diltaizem to 120 mg bid, watch for hypotension at home and contact office if it  should occur Continue metoprolol/digoxin/pradaxa(no bleeding issues) Otherwise will see in f/u in one week  2. Abscess perforation s/p colectomy/colostomy Appears to be recuperating well F/u with surgeon as scheduled Home health nurse is in the home 3x q week     Geroge Baseman. Margarett Viti, [facility] [address] [place], [place] [address] [phone]   Addendum- After further in depth review of all notes, on d/c summary, Dx was diverticulitis with abscess with perf/ colectotomy/colostomy. However per surgical note, Dr. Donne Hazel, surgeon, noted that that the issue with  bowel did not appear to be diverticulitis, but left colon and splenic fixture appeared necrotic. This prompted Dr. Rayann Heman to suggest coumadin instead of pradaxa,pt was switched to levenox/heparin/ coumadin. However, his INR's  were very erratic and the decision was made by internal medicine/ general cardiology to return to use of pradaxa, which he was discharged on. However, Dr. Rayann Heman still fells that pt would be better served with Coumadin to prevent future recurrent thrombo/embolic events. I have contacted the coumadin clinic and they will call the pt and schedule appointment to start coumadin. Pt made aware.

## 2015-02-09 NOTE — Patient Instructions (Signed)
Your physician has recommended you make the following change in your medication:  1)Increase Cardizem 120mg  to twice a day

## 2015-02-10 ENCOUNTER — Telehealth: Payer: Self-pay | Admitting: Pharmacist

## 2015-02-10 NOTE — Addendum Note (Signed)
Encounter addended by: Sherran Needs, NP on: 02/10/2015 11:10 AM<BR>     Documentation filed: Notes Section

## 2015-02-10 NOTE — Telephone Encounter (Signed)
William Fischer from the afib clinic called because pt will be changing from pradaxa to warfarin. She will call patient to discuss the change. I will call pt later today to set-up appointment in the Coumadin clinic.

## 2015-02-10 NOTE — Telephone Encounter (Signed)
Called over to Afib clinic to follow-up about transition to warfarin from pradaxa. Per Kerby Less conversation with Dr. Rayann Heman patient is to be on Lovenox until INR therapeutic.   Spoke with Patient and his mother. He seems extremely confused and overwhelmed with his medical care. We will bring him to the office to go over lovenox bridge and transition to coumadin from Pradaxa. Made appt for tomorrow morning and instructed patient to continue pradaxa until we see him tomorrow. He stated he understood.

## 2015-02-11 ENCOUNTER — Other Ambulatory Visit: Payer: Self-pay | Admitting: Pharmacist

## 2015-02-11 ENCOUNTER — Ambulatory Visit (INDEPENDENT_AMBULATORY_CARE_PROVIDER_SITE_OTHER): Payer: Medicaid Other | Admitting: Pharmacist

## 2015-02-11 ENCOUNTER — Telehealth: Payer: Self-pay | Admitting: Pharmacist

## 2015-02-11 DIAGNOSIS — I4891 Unspecified atrial fibrillation: Secondary | ICD-10-CM | POA: Diagnosis not present

## 2015-02-11 DIAGNOSIS — Z7901 Long term (current) use of anticoagulants: Secondary | ICD-10-CM | POA: Insufficient documentation

## 2015-02-11 DIAGNOSIS — I482 Chronic atrial fibrillation, unspecified: Secondary | ICD-10-CM

## 2015-02-11 DIAGNOSIS — Z5181 Encounter for therapeutic drug level monitoring: Secondary | ICD-10-CM | POA: Diagnosis not present

## 2015-02-11 DIAGNOSIS — I824Y9 Acute embolism and thrombosis of unspecified deep veins of unspecified proximal lower extremity: Secondary | ICD-10-CM | POA: Insufficient documentation

## 2015-02-11 MED ORDER — LOVENOX 120 MG/0.8ML ~~LOC~~ SOLN: 120.0000 mg | Freq: Two times a day (BID) | SUBCUTANEOUS | Status: DC

## 2015-02-11 MED ORDER — WARFARIN SODIUM 5 MG PO TABS: 5.0000 mg | ORAL_TABLET | Freq: Every day | ORAL | Status: DC

## 2015-02-11 MED ORDER — ENOXAPARIN SODIUM 120 MG/0.8ML ~~LOC~~ SOLN
120.0000 mg | Freq: Two times a day (BID) | SUBCUTANEOUS | Status: DC
Start: 2015-02-11 — End: 2015-02-11

## 2015-02-11 NOTE — Telephone Encounter (Signed)
Pt needs brand Lovenox for Medicaid to cover. Pharmacy is ordering for tomorrow. Will have pt take Pradaxa this PM and tomorrow AM, then start Lovenox tomorrow PM (1/26). Will have him start Coumadin tonight still (1/25) and keep f/u appt on Monday 1/30.

## 2015-02-11 NOTE — Telephone Encounter (Signed)
Pt called regarding Lovenox. Medicaid requires brand name for insurance purposes. [facility] needs to order Lovenox for tomorrow - copay will be $3. Pt will take Pradaxa this evening and tomorrow morning, pick up Lovenox tomorrow and start injections tomorrow evening (1/26). He will start Coumadin tonight 1/25, and still keep his follow up appt in Coumadin clinic on Monday 1/30.

## 2015-02-11 NOTE — Patient Instructions (Signed)
Follow printed instructions for lovenox injections and coumadin.

## 2015-02-12 ENCOUNTER — Other Ambulatory Visit: Payer: Self-pay | Admitting: Diagnostic Radiology

## 2015-02-12 ENCOUNTER — Ambulatory Visit
Admit: 2015-02-12 | Discharge: 2015-02-12 | Disposition: A | Discharge status: Home / Self Care | Payer: Medicaid Other | Attending: Diagnostic Radiology | Admitting: Diagnostic Radiology

## 2015-02-12 ENCOUNTER — Ambulatory Visit
Admit: 2015-02-12 | Discharge: 2015-02-12 | Disposition: A | Discharge status: Home / Self Care | Payer: Medicaid Other | Attending: General Surgery | Admitting: General Surgery

## 2015-02-12 ENCOUNTER — Ambulatory Visit: Admit: 2015-02-12 | Payer: Medicaid Other

## 2015-02-12 ENCOUNTER — Other Ambulatory Visit: Payer: Self-pay | Admitting: General Surgery

## 2015-02-12 DIAGNOSIS — K631 Perforation of intestine (nontraumatic): Secondary | ICD-10-CM

## 2015-02-12 DIAGNOSIS — K651 Peritoneal abscess: Secondary | ICD-10-CM

## 2015-02-12 MED ORDER — IOPAMIDOL (ISOVUE-300) INJECTION 61%
125.0000 mL | Freq: Once | INTRAVENOUS | Status: AC | PRN
  Administered 2015-02-12: 125 mL via INTRAVENOUS

## 2015-02-12 NOTE — Progress Notes (Signed)
Patient ID: William Fischer, male   DOB: 1965/09/20, 50 y.o.   MRN: [id number]   Referring Physician(s): [place],M  Chief Complaint: The patient is seen in follow up today s/p CT guided drainage of left pelvic abscess on 01/29/15  History of present illness: William Fischer is a 50 year old white male, patient of Dr. 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 Follow-up imaging on 01/28/15 revealed rectal stump blowout with subsequent pelvic abscess development. He  underwent CT-guided drainage of the left pelvic abscess on 01/29/15 which revealed feculent material. Cultures grew Escherichia coli. He presents today for follow-up CT scan and drain check. He is currently irrigating the drain 3 times daily with 5-10 mL of sterile normal saline. There is still significant output from the drain and the fluid is purulent and light green in color. Patient states he has been having intermittent fevers, as well as weakness and dizziness along with headaches and some shortness of breath. He currently denies nausea, vomiting, significant abdominal or back pain, or abnormal bleeding. He is currently on Omnicef.   Past Medical History  Diagnosis Date  . Chicken pox as a child  . Migraine 06/29/2011  . Obesity 06/29/2011  . Depression with anxiety 06/29/2011  . Atrial fibrillation ([place])     admx 11/13 with acute sCHF in setting of RVR  => a. failed DCCV x 2; b. Pradaxa started;  c. failed sotalol  . Chronic systolic heart failure ([place])     a. echo 11/13: Ef 40-45%, diff HK, mod MR, mod LAE, mild RVE, mod RAE, small effusion;   b. TEE 11/13:  EF 35-40%, no LAA clot; Echo 2/14 shows normal EF  . 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 Snoring     patient needs sleep study - has declined  . Allergy to IVP dye   . Chronic anticoagulation     Pradaxa    Past Surgical History  Procedure Laterality Date  .  Tee without cardioversion  11/30/2011    Procedure: TRANSESOPHAGEAL ECHOCARDIOGRAM (TEE);  Surgeon: Thayer Headings, MD;  Location: [place];  Service: Cardiovascular;  Laterality: N/A;  . Cardioversion  11/30/2011    Procedure: CARDIOVERSION;  Surgeon: Thayer Headings, MD;  Location: [place];  Service: Cardiovascular;  Laterality: N/A;  . Cardioversion  12/03/2011    Procedure: CARDIOVERSION;  Surgeon: Jolaine Artist, MD;  Location: [facility];  Service: Cardiovascular;  Laterality: N/A;  . Laparotomy N/A 01/17/2015    Procedure: EXPLORATORY LAPAROTOMY WITH LEFT COLECTOMY AND COLOSTOMY;  Surgeon: Rolm Bookbinder, MD;  Location: [place];  Service: General;  Laterality: N/A;    Allergies: Contrast media  Medications: Prior to Admission medications   Medication Sig Start Date End Date Taking? Authorizing Provider  cefdinir (OMNICEF) 300 MG capsule Take 1 capsule (300 mg total) by mouth 2 (two) times daily. Last dose on 02/11/15 02/02/15   Liberty Handy, MD  digoxin (LANOXIN) 0.125 MG tablet Take 1 tablet (0.125 mg total) by mouth daily. 02/02/15   Liberty Handy, MD  diltiazem (CARDIZEM CD) 120 MG 24 hr capsule Take 1 capsule (120 mg total) by mouth 2 (two) times daily. 02/09/15   Sherran Needs, NP  feeding supplement (BOOST / RESOURCE BREEZE) LIQD Take 1 Container by mouth 3 (three) times daily between meals. 02/02/15   Liberty Handy, MD  furosemide (LASIX) 40 MG tablet Take 1  tablet (40 mg total) by mouth daily. 02/02/15   Liberty Handy, MD  LOVENOX 120 MG/0.8ML injection Inject 0.8 mLs (120 mg total) into the skin every 12 (twelve) hours. 02/11/15   Thompson Grayer, MD  Metoprolol Tartrate 75 MG TABS Take 150 mg by mouth 2 (two) times daily. 02/02/15   Liberty Handy, MD  oxyCODONE (OXY IR/ROXICODONE) 5 MG immediate release tablet Take 1-2 tablets (5-10 mg total) by mouth every 4 (four) hours as needed for breakthrough pain. 02/02/15   Liberty Handy, MD  warfarin (COUMADIN) 5 MG tablet Take 1 tablet (5 mg  total) by mouth daily. Take as directed by the Coumadin clinic. 02/11/15   Thompson Grayer, MD     Family History  Problem Relation Age of Onset  . Hypertension Mother   . Hypertension Father   . Aneurysm Father   . Alcohol abuse Father   . Cancer Maternal Grandmother     brain  . Diabetes Maternal Grandfather     type 2  . Parkinsonism Maternal Grandfather   . Cancer Paternal Grandmother     breast  . Diabetes Paternal Grandmother   . Alcohol abuse Paternal Grandfather     Social History   Social History  . Marital Status: Divorced    Spouse Name: N/A  . Number of Children: N/A  . Years of Education: N/A   Social History Main Topics  . Smoking status: Former Smoker -- 1.00 packs/day for 20 years    Types: Cigarettes    Quit date: 07/10/2014  . Smokeless tobacco: Never Used  . Alcohol Use: No  . Drug Use: No  . Sexual Activity: No   Other Topics Concern  . Not on file   Social History Narrative     Vital Signs: BP 126/68 mmHg  Pulse 104  Temp(Src) 97.7 F (36.5 C) (Oral)  SpO2 96%  Physical Exam patient is awake, alert. Chest with slightly diminished breath sounds at the bases, greater on left. Heart with irregularly iregular rhythm; abdomen obese, soft, positive bowel sounds, intact mid line wound VAC and right lower quadrant colostomy; left lower quadrant drain intact, insertion site okay, not significantly tender. Light green purulent fluid in drain bag.  Imaging: No results found.  Labs:  CBC:  Recent Labs  01/30/15 0230 01/31/15 0137 02/01/15 0520 02/02/15 0228  WBC 9.4 9.2 7.8 7.8  HGB 12.8* 12.3* 12.9* 12.1*  HCT 39.6 37.8* 40.1 38.6*  PLT 349 310 325 323    COAGS:  Recent Labs  01/27/15 0341 01/28/15 0442 01/29/15 0420 01/30/15 0230  INR 3.66* 3.47* 1.34 1.22    BMP:  Recent Labs  01/29/15 0420 01/30/15 0230 01/31/15 0137 02/01/15 0520  NA 140 139 140 138  K 3.7 3.4* 3.5 3.9  CL 87* 88* 90* 91*  CO2 43* 43* 38* 41*    GLUCOSE 171* 145* 178* 121*  BUN 23* 21* 15 9  CALCIUM 8.5* 8.0* 8.0* 8.1*  CREATININE 0.97 0.96 0.78 0.75  GFRNONAA >60 >60 >60 >60  GFRAA >60 >60 >60 >60    LIVER FUNCTION TESTS:  Recent Labs  01/14/15 0906 01/19/15 0445  BILITOT 1.2 1.1  AST 22 47*  ALT 21 26  ALKPHOS 85 67  PROT 6.7 4.9*  ALBUMIN 2.6* 1.4*    Assessment and Plan: Patient status 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 Patient continues to have significant purulent output from drain. Follow-up CT today revealed improved but unresolved abscess cavity. An additional 120 mL of purulent light green fluid were removed via syringe today. The drain was attached to JP bulb to expedite fluid removal. Patient was given a gravity bag to take home if needed. He is scheduled for follow-up with Dr. Donne Hazel tomorrow. He will tentatively be scheduled for follow-up CT scan in 2 weeks pending further assessment by Dr. Donne Hazel.   Electronically Signed: D. Rowe Robert 02/12/2015, 12:05 PM   I spent a total of 15 minutes in face to face in clinical consultation, greater than 50% of which was counseling/coordinating care for left pelvic abscess drain

## 2015-02-14 ENCOUNTER — Emergency Department (HOSPITAL_COMMUNITY)
Admission: EM | Admit: 2015-02-14 | Discharge: 2015-02-14 | Disposition: A | Discharge status: Home / Self Care | Payer: Medicaid Other | Attending: Emergency Medicine | Admitting: Emergency Medicine

## 2015-02-14 ENCOUNTER — Encounter (HOSPITAL_COMMUNITY): Payer: Self-pay

## 2015-02-14 DIAGNOSIS — B999 Unspecified infectious disease: Secondary | ICD-10-CM

## 2015-02-14 DIAGNOSIS — E669 Obesity, unspecified: Secondary | ICD-10-CM | POA: Insufficient documentation

## 2015-02-14 DIAGNOSIS — Z79899 Other long term (current) drug therapy: Secondary | ICD-10-CM | POA: Diagnosis not present

## 2015-02-14 DIAGNOSIS — Z7901 Long term (current) use of anticoagulants: Secondary | ICD-10-CM | POA: Diagnosis not present

## 2015-02-14 DIAGNOSIS — K6289 Other specified diseases of anus and rectum: Secondary | ICD-10-CM | POA: Diagnosis not present

## 2015-02-14 DIAGNOSIS — R198 Other specified symptoms and signs involving the digestive system and abdomen: Secondary | ICD-10-CM

## 2015-02-14 DIAGNOSIS — I5022 Chronic systolic (congestive) heart failure: Secondary | ICD-10-CM | POA: Insufficient documentation

## 2015-02-14 DIAGNOSIS — Y658 Other specified misadventures during surgical and medical care: Secondary | ICD-10-CM | POA: Insufficient documentation

## 2015-02-14 DIAGNOSIS — Z8619 Personal history of other infectious and parasitic diseases: Secondary | ICD-10-CM | POA: Diagnosis not present

## 2015-02-14 DIAGNOSIS — Z8659 Personal history of other mental and behavioral disorders: Secondary | ICD-10-CM | POA: Insufficient documentation

## 2015-02-14 DIAGNOSIS — T814XXA Infection following a procedure, initial encounter: Secondary | ICD-10-CM | POA: Diagnosis not present

## 2015-02-14 DIAGNOSIS — Z87891 Personal history of nicotine dependence: Secondary | ICD-10-CM | POA: Diagnosis not present

## 2015-02-14 DIAGNOSIS — G8918 Other acute postprocedural pain: Secondary | ICD-10-CM | POA: Diagnosis present

## 2015-02-14 LAB — I-STAT CG4 LACTIC ACID, ED: Lactic Acid, Venous: 2.25 mmol/L (ref 0.5–2.0)

## 2015-02-14 LAB — COMPREHENSIVE METABOLIC PANEL
ALBUMIN: 2.2 g/dL — AB (ref 3.5–5.0)
ALK PHOS: 68 U/L (ref 38–126)
ALT: 40 U/L (ref 17–63)
AST: 22 U/L (ref 15–41)
Anion gap: 13 (ref 5–15)
BILIRUBIN TOTAL: 0.4 mg/dL (ref 0.3–1.2)
BUN: 9 mg/dL (ref 6–20)
CALCIUM: 8.9 mg/dL (ref 8.9–10.3)
CO2: 29 mmol/L (ref 22–32)
Chloride: 94 mmol/L — ABNORMAL LOW (ref 101–111)
Creatinine, Ser: 0.85 mg/dL (ref 0.61–1.24)
GFR calc Af Amer: >60 mL/min (ref >60)
GFR calc non Af Amer: >60 mL/min (ref >60)
GLUCOSE: 134 mg/dL — AB (ref 65–99)
POTASSIUM: 4.2 mmol/L (ref 3.5–5.1)
SODIUM: 136 mmol/L (ref 135–145)
TOTAL PROTEIN: 7 g/dL (ref 6.5–8.1)

## 2015-02-14 LAB — URINALYSIS, ROUTINE W REFLEX MICROSCOPIC
Bilirubin Urine: NEGATIVE
Glucose, UA: NEGATIVE mg/dL
Hgb urine dipstick: NEGATIVE
KETONES UR: NEGATIVE mg/dL
LEUKOCYTES UA: NEGATIVE
Nitrite: NEGATIVE
PROTEIN: NEGATIVE mg/dL
Specific Gravity, Urine: 1.008 (ref 1.005–1.030)
pH: 7.5 (ref 5.0–8.0)

## 2015-02-14 LAB — CBC
HEMATOCRIT: 37.7 % — AB (ref 39.0–52.0)
HEMOGLOBIN: 12.2 g/dL — AB (ref 13.0–17.0)
MCH: 31 pg (ref 26.0–34.0)
MCHC: 32.4 g/dL (ref 30.0–36.0)
MCV: 95.7 fL (ref 78.0–100.0)
Platelets: 430 10*3/uL — ABNORMAL HIGH (ref 150–400)
RBC: 3.94 MIL/uL — AB (ref 4.22–5.81)
RDW: 16.8 % — ABNORMAL HIGH (ref 11.5–15.5)
WBC: 11.5 10*3/uL — ABNORMAL HIGH (ref 4.0–10.5)

## 2015-02-14 LAB — PROTIME-INR
INR: 1.28 (ref 0.00–1.49)
PROTHROMBIN TIME: 16.1 s — AB (ref 11.6–15.2)

## 2015-02-14 MED ORDER — CIPROFLOXACIN HCL 500 MG PO TABS: 500.0000 mg | ORAL_TABLET | Freq: Two times a day (BID) | ORAL | Status: DC

## 2015-02-14 MED ORDER — SODIUM CHLORIDE 0.9 % IV BOLUS (SEPSIS)
1000.0000 mL | Freq: Once | INTRAVENOUS | Status: AC
  Administered 2015-02-14: 1000 mL via INTRAVENOUS

## 2015-02-14 MED ORDER — CIPROFLOXACIN HCL 500 MG PO TABS
500.0000 mg | ORAL_TABLET | Freq: Once | ORAL | Status: AC
  Administered 2015-02-14: 500 mg via ORAL
  Filled 2015-02-14: qty 1

## 2015-02-14 NOTE — ED Notes (Signed)
Pt. States he took pain medication prior to coming, denies pain at this time. States he has some dizziness and SOB.

## 2015-02-14 NOTE — Discharge Instructions (Signed)
Please read and follow all provided instructions.  Your diagnoses today include:  1. Intra-abdominal infection   2. Rectal discharge    Tests performed today include:  Blood counts and electrolytes  Blood tests to check liver and kidney function  Urine test to look for infection   Coumadin level - 1.2 which is low, please call your doctor to have this rechecked and managed  Vital signs. See below for your results today.   Medications prescribed:   Ciprofloxacin - antibiotic  You have been prescribed an antibiotic medicine: take the entire course of medicine even if you are feeling better. Stopping early can cause the antibiotic not to work.  This medication could make it more difficult to control your coumadin level. It is important to have your level closely monitored while taking this medication. Please call your coumadin clinic and have them follow this closely.   Take any prescribed medications only as directed.  Home care instructions:   Follow any educational materials contained in this packet.  Follow-up instructions: Please follow-up with your primary care provider/surgeon in the next 7 days for further evaluation of your symptoms.    Return instructions:  SEEK IMMEDIATE MEDICAL ATTENTION IF:  The pain does not go away or becomes severe   A temperature above 101F develops   Repeated vomiting occurs (multiple episodes)   The pain becomes localized to portions of the abdomen. The right side could possibly be appendicitis. In an adult, the left lower portion of the abdomen could be colitis or diverticulitis.   Blood is being passed in stools or vomit (bright red or black tarry stools)   You develop chest pain, difficulty breathing, dizziness or fainting, or become confused, poorly responsive, or inconsolable (young children)  If you have any other emergent concerns regarding your health  Additional Information: Abdominal (belly) pain can be caused by many  things. Your caregiver performed an examination and possibly ordered blood/urine tests and imaging (CT scan, x-rays, ultrasound). Many cases can be observed and treated at home after initial evaluation in the emergency department. Even though you are being discharged home, abdominal pain can be unpredictable. Therefore, you need a repeated exam if your pain does not resolve, returns, or worsens. Most patients with abdominal pain don't have to be admitted to the hospital or have surgery, but serious problems like appendicitis and gallbladder attacks can start out as nonspecific pain. Many abdominal conditions cannot be diagnosed in one visit, so follow-up evaluations are very important.  Your vital signs today were: BP 111/67 mmHg   Pulse 101   Temp(Src) 98.6 F (37 C) (Oral)   Resp 20   Ht 5\' 10"  (1.778 m)   Wt 124.286 kg   BMI 39.32 kg/m2   SpO2 94% If your blood pressure (bp) was elevated above 135/85 this visit, please have this repeated by your doctor within one month. --------------

## 2015-02-14 NOTE — ED Provider Notes (Signed)
CSN: [id number]     Arrival date & time 02/14/15  0912 History   First MD Initiated Contact with Patient 02/14/15 [phone]     Chief Complaint  Patient presents with  . 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 Patient denies vomiting. No change in colostomy throughput. No change in output from percutaneous drain. The onset of this condition was acute. The course is constant. Aggravating factors: none. Alleviating factors: none.   Patient also has history of atrial fibrillation and is currently on Coumadin.    The history is provided by the patient and medical records.    Past Medical History  Diagnosis Date  . Chicken pox as a child  . Migraine 06/29/2011  . Obesity 06/29/2011  . Depression with anxiety 06/29/2011  . Atrial fibrillation ([place])     admx 11/13 with acute sCHF in setting of RVR  => a. failed DCCV x 2; b. Pradaxa started;  c. failed sotalol  . Chronic systolic heart failure ([place])     a. echo 11/13: Ef 40-45%, diff HK, mod MR, mod LAE, mild RVE, mod RAE, small effusion;   b. TEE 11/13:  EF 35-40%, no LAA clot; Echo 2/14 shows normal EF  . Cardiomyopathy ([place])     likely tachy mediated in setting of AF with RVR  . Snoring     patient needs sleep study - has declined  . Allergy to IVP dye   . Chronic anticoagulation     Pradaxa   Past Surgical History  Procedure Laterality Date  . Tee without cardioversion  11/30/2011    Procedure: TRANSESOPHAGEAL ECHOCARDIOGRAM (TEE);  Surgeon: Thayer Headings, MD;  Location: [facility];  Service: Cardiovascular;  Laterality: N/A;  . Cardioversion  11/30/2011    Procedure: CARDIOVERSION;  Surgeon: Thayer Headings, MD;  Location: [facility];  Service: Cardiovascular;  Laterality: N/A;  . Cardioversion  12/03/2011    Procedure: CARDIOVERSION;  Surgeon: Jolaine Artist, MD;  Location: [facility];  Service: Cardiovascular;  Laterality: N/A;  . Laparotomy N/A 01/17/2015    Procedure: EXPLORATORY LAPAROTOMY WITH LEFT COLECTOMY AND COLOSTOMY;  Surgeon: Rolm Bookbinder, MD;  Location: [facility];  Service: General;  Laterality: N/A;   Family History  Problem Relation Age of Onset  . Hypertension Mother   . Hypertension Father   . Aneurysm Father   . Alcohol abuse Father   . Cancer Maternal Grandmother     brain  . Diabetes Maternal Grandfather     type 2  . Parkinsonism Maternal Grandfather   . Cancer Paternal Grandmother     breast  . Diabetes Paternal Grandmother   . Alcohol abuse Paternal Grandfather    Social History  Substance Use Topics  . Smoking status: Former Smoker -- 1.00 packs/day for 20 years    Types: Cigarettes    Quit date: 07/10/2014  . Smokeless tobacco: Never Used  . Alcohol Use: No    Review of Systems  Constitutional: Positive for fever and chills.  HENT: Negative for rhinorrhea and sore throat.   Eyes: Negative for redness.  Respiratory: Negative for cough.   Cardiovascular: Negative for chest pain.  Gastrointestinal: Positive for abdominal pain. Negative for nausea, vomiting and diarrhea.  Genitourinary: Negative for dysuria.  Musculoskeletal: Negative for myalgias.  Skin: Negative for rash.  Neurological: Negative for headaches.      Allergies  Contrast media  Home Medications   Prior to Admission medications   Medication Sig Start Date End Date Taking? Authorizing Provider  cefdinir (OMNICEF) 300 MG capsule Take 1 capsule (300 mg total) by mouth 2 (two) times daily. Last dose on 02/11/15 02/02/15    Liberty Handy, MD  digoxin (LANOXIN) 0.125 MG tablet Take 1 tablet (0.125 mg total) by mouth daily. 02/02/15   Liberty Handy, MD  diltiazem (CARDIZEM CD) 120 MG 24 hr capsule Take 1 capsule (120 mg total) by mouth 2 (two) times daily. 02/09/15   Sherran Needs, NP  feeding supplement (BOOST / RESOURCE BREEZE) LIQD Take 1 Container by mouth 3 (three) times daily between meals. 02/02/15   Liberty Handy, MD  furosemide (LASIX) 40 MG tablet Take 1 tablet (40 mg total) by mouth daily. 02/02/15   Liberty Handy, MD  LOVENOX 120 MG/0.8ML injection Inject 0.8 mLs (120 mg total) into the skin every 12 (twelve) hours. 02/11/15   Thompson Grayer, MD  Metoprolol Tartrate 75 MG TABS Take 150 mg by mouth 2 (two) times daily. 02/02/15   Liberty Handy, MD  oxyCODONE (OXY IR/ROXICODONE) 5 MG immediate release tablet Take 1-2 tablets (5-10 mg total) by mouth every 4 (four) hours as needed for breakthrough pain. 02/02/15   Liberty Handy, MD  warfarin (COUMADIN) 5 MG tablet Take 1 tablet (5 mg total) by mouth daily. Take as directed by the Coumadin clinic. 02/11/15   Thompson Grayer, MD   BP 111/68 mmHg  Pulse 94  Temp(Src) 98.6 F (37 C) (Oral)  Resp 23  Ht 5\' 10"  (1.778 m)  Wt 124.286 kg  BMI 39.32 kg/m2  SpO2 95% Physical Exam  Constitutional: He appears well-developed and well-nourished.  HENT:  Head: Normocephalic and atraumatic.  Mouth/Throat: Oropharynx is clear and moist.  Eyes: Conjunctivae are normal. Right eye exhibits no discharge. Left eye exhibits no discharge.  Neck: Normal range of motion. Neck supple.  Cardiovascular: Normal rate, regular rhythm and normal heart sounds.   Pulmonary/Chest: Effort normal and breath sounds normal. No respiratory distress. He has no wheezes. He has no rales.  Abdominal: Soft. There is no rebound and no guarding.  Wound vac in place central abdomen, left-sided percutaneous drain in place with green purulent output, colostomy in place with stool in bag.   Genitourinary:  There is some  crusting noted externally, however no active rectal drainage or perineal tenderness or erythema.  Neurological: He is alert.  Skin: Skin is warm and dry.  Psychiatric: He has a normal mood and affect.  Nursing note and vitals reviewed.   ED Course  Procedures (including critical care time) Labs Review Labs Reviewed  COMPREHENSIVE METABOLIC PANEL - Abnormal; Notable for the following:    Chloride 94 (*)    Glucose, Bld 134 (*)    Albumin 2.2 (*)    All other components within normal limits  URINALYSIS, ROUTINE W REFLEX MICROSCOPIC (NOT AT [facility]) - Abnormal; Notable for the following:    APPearance CLOUDY (*)    All other components within normal limits  CBC - Abnormal; Notable for the following:    WBC 11.5 (*)    RBC 3.94 (*)    Hemoglobin 12.2 (*)    HCT 37.7 (*)    RDW 16.8 (*)  Platelets 430 (*)    All other components within normal limits  PROTIME-INR - Abnormal; Notable for the following:    Prothrombin Time 16.1 (*)    All other components within normal limits  I-STAT CG4 LACTIC ACID, ED - Abnormal; Notable for the following:    Lactic Acid, Venous 2.25 (*)    All other components within normal limits  URINE CULTURE    Imaging Review Ct Abdomen Pelvis W Contrast  02/12/2015  CLINICAL DATA:  Follow-up intra-abdominal abscesses and drainage catheter. History of bowel perforation and subsequent postoperative abscess. EXAM: CT ABDOMEN AND PELVIS WITH CONTRAST TECHNIQUE: Multidetector CT imaging of the abdomen and pelvis was performed using the standard protocol following bolus administration of intravenous contrast. CONTRAST:  1106mL ISOVUE-300 IOPAMIDOL (ISOVUE-300) INJECTION 61% COMPARISON:  None. 01/28/2015 and 01/14/2015 FINDINGS: Lower chest: There is a punctate peripheral nodule in the right lower lobe on sequence 4, image 88 which is indeterminate but probably unchanged. There continues to be a small to moderate sized left pleural effusion with compressive atelectasis.  The small amount of right pleural fluid has resolved. Evidence for atelectasis or volume loss at the right lung base. Hepatobiliary: Again noted is a focal low-density structure along the posterior right hepatic lobe on sequence 3, image 15 that roughly measures 1.3 cm. This is grossly unchanged and likely represents a benign etiology such as a cyst. No gross abnormality to the liver and the portal venous system is patent. Pancreas: Normal appearance of the pancreas. The it air-fluid collection along the posterior and inferior aspect of the pancreatic body has decreased in size. No pancreatic duct dilatation. Spleen: Normal appearance of the spleen. The air-fluid collection near the splenic hilum has markedly decreased in size. There continues to be a complex air-fluid collection medial to the spleen. This area measures 8.0 x 4.4 cm on sequence 3, image 18 and previously measured 9.5 x 4.8 cm at a similar level. Adrenals/Urinary Tract: Stable fullness and possible nodule involving the right adrenal lateral limb. There is a stable fullness in the left adrenal suggestive for hyperplasia. The right adrenal nodule could represent a small adenoma but indeterminate. No acute abnormality involving the kidneys. Normal appearance of the urinary bladder. Stomach/Bowel: Postsurgical changes associated with a left colectomy with a Hartmann's pouch. There is an air-fluid collection associated with the Hartmann's pouch on sequence 3, image 75 and this is associated with a large abscess extending along the left side of the abdomen. There continues to be low-density material within the Hartmann's pouch but the pouch is more decompressed than the previous examination. Mesenteric stranding and inflammatory changes along the left side of the abdomen adjacent loops of small bowel. Mild wall thickening involving the proximal jejunum on sequence 3, image 50. Distal small bowel is decompressed. No gross abnormality to the right colon and  there is a colostomy on the right side of the abdomen. Vascular/Lymphatic: Again noted are prominent lymph nodes in the upper abdomen. Conglomeration of nodes near the porta hepatis measures up to 2.8 cm on sequence 3, images 26 and previously measured roughly 2.3 cm. Again noted are some scattered periaortic lymph nodes. Minimal atherosclerotic disease in the right common iliac artery. No evidence for an aortic aneurysm. Reproductive: Normal appearance of the prostate and seminal vesicles. Other: Percutaneous abscess drain in the left lower quadrant the abdomen remains well positioned within the abscess cavity. The abscess cavity has been decompressed compared to the prior examination but there continues to be a large  air-fluid collection extending up the left side of the abdomen. Difficult to measure the size of this abscess due to its large irregular shape. At the level of the left kidney, this collection roughly measures 10.3 x 3.5 cm and previously measured 15.3 x 8.2 cm. There continues to be additional non continuous air-fluid collections throughout the abdomen. There is a complex air-fluid collection along the left side of the left psoas muscle on sequence 3, image 49 that measures 5.5 x 2.2 cm and previously measured 7.5 x 3.4 cm. Air-fluid collection medial to the spleen has decreased in size as described above. There continues to be small foci of air anterior to the spleen but these extraluminal collections have markedly decreased in size. Postsurgical changes along the anterior abdomen compatible with an open wound with a wound VAC. Musculoskeletal:  No acute bone abnormality. IMPRESSION: The large abscess collection along the left side of the abdomen has decreased in size but there continues to be a large amount of gas and fluid within the collection. 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 Additional air-fluid collections scattered  throughout the left side of the abdomen but these have all decreased in size. Percutaneous abscess drain is well positioned along the caudal aspect of the abscess. Persistent left pleural effusion. Resolution of the right pleural fluid. Prominent lymph nodes in the periportal region. Cannot exclude interval enlargement of these nodes and recommend attention on follow up imaging. Few incidental finding, such as a right adrenal nodule and punctate right lung nodule. Recommend attention to these on follow up imaging. Electronically Signed   By: Markus Daft M.D.   On: 02/12/2015 12:52   I have personally reviewed and evaluated these images and lab results as part of my medical decision-making.   EKG Interpretation None       9:48 AM Patient seen and examined. Work-up initiated.   Vital signs reviewed and are as follows: BP 111/68 mmHg  Pulse 94  Temp(Src) 98.6 F (37 C) (Oral)  Resp 23  Ht 5\' 10"  (1.778 m)  Wt 124.286 kg  BMI 39.32 kg/m2  SpO2 95%   10:09 AM Discussed with Dr. Johnney Killian who has seen. I have spoken with general surgery who will consult.   11:11 AM Surgery has seen, pt cleared to go home. They would like patient on Cipro 500mg  bid and have asked me to provide prescription.   Pt updated on results. His INR is low. Also, cipro can cause variations in Coumadin, and patient asked to follow-up closely with his Coumadin clinic.  The patient was urged to return to the Emergency Department immediately with worsening of current symptoms, worsening abdominal pain, persistent vomiting, blood noted in stools, fever, or any other concerns. The patient verbalized understanding.    MDM   Final diagnoses:  Intra-abdominal infection  Rectal discharge   Patient with significant postop course and complications. 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 Patient seen by surgery. He does not appear to be septic or toxic. Will be discharged to home with Cipro as instructed by surgery.  Precautions as above. Patient verbalizes understanding and is in agreement with plan.    Carlisle Cater, PA-C 02/14/15 1114  Charlesetta Shanks, MD 02/14/15 1145

## 2015-02-14 NOTE — Progress Notes (Signed)
Patient ID: William Fischer, male   DOB: 08-03-1965, 50 y.o.   MRN: [id number]     [address] [place], [place] [address]    Phone: [phone] FAX: [phone]     Subjective: William Fischer is a 50 year old male with a history of atrial fibrillation with RVR, obesity who underwent a exploratory laparotomy, left colectomy and end colostomy on 01/17/15 by Dr. Donne Hazel for perforated diverticulitis.  This was complicated by a rectal stump leak and a large abscess which is s/p drainage.  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  He was also seen by Dr. Donne Hazel yesterday.  Antibiotics were stopped yesterday.  Apparently, he had a fever of 101 last night and this morning felt  The urge to have bowel movement which produced large amount of purulent output. Appetite has been great.  Ostomy is functioning and VAC in place to abdominal wound.  In the ED, found to have WBC 11.5k.  Afebrile.  VSS.  Objective:  Vital signs:  Filed Vitals:   02/14/15 0924 02/14/15 0930 02/14/15 0945 02/14/15 1000  BP: 103/65 111/68 114/69 105/75  Pulse: 96 94 104 100  Temp:      TempSrc:      Resp: 27 23 24 24   Height: 5' 10"  (1.778 m)     Weight: 124.286 kg (274 lb)     SpO2: 94% 95% 93% 92%       Intake/Output   Yesterday:    This shift:     Physical Exam: General: Pt awake/alert/oriented x4 in no acute distress Abdomen: +BS, abdomen is soft, non tender.  Midline VAC in place.  LLQ drain with purulent drainage.    Problem List:   Active Problems:   * No active hospital problems. *    Results:   Labs: Results for orders placed or performed during the hospital encounter of 02/14/15 (from the past 48 hour(s))  Comprehensive metabolic panel     Status: Abnormal   Collection Time: 02/14/15  9:38 AM  Result Value  Ref Range   Sodium 136 135 - 145 mmol/L   Potassium 4.2 3.5 - 5.1 mmol/L   Chloride 94 (L) 101 - 111 mmol/L   CO2 29 22 - 32 mmol/L   Glucose, Bld 134 (H) 65 - 99 mg/dL   BUN 9 6 - 20 mg/dL   Creatinine, Ser 0.85 0.61 - 1.24 mg/dL   Calcium 8.9 8.9 - 10.3 mg/dL   Total Protein 7.0 6.5 - 8.1 g/dL   Albumin 2.2 (L) 3.5 - 5.0 g/dL   AST 22 15 - 41 U/L   ALT 40 17 - 63 U/L   Alkaline Phosphatase 68 38 - 126 U/L   Total Bilirubin 0.4 0.3 - 1.2 mg/dL   GFR calc non Af Amer >60 >60 mL/min   GFR calc Af Amer >60 >60 mL/min    Comment: (NOTE) The eGFR has been calculated using the CKD EPI equation. This calculation has not been validated in all clinical situations. eGFR's persistently <60 mL/min signify possible Chronic Kidney Disease.    Anion gap 13 5 - 15  CBC     Status: Abnormal   Collection Time: 02/14/15  9:38 AM  Result Value Ref Range   WBC 11.5 (H) 4.0 -  10.5 K/uL   RBC 3.94 (L) 4.22 - 5.81 MIL/uL   Hemoglobin 12.2 (L) 13.0 - 17.0 g/dL   HCT 37.7 (L) 39.0 - 52.0 %   MCV 95.7 78.0 - 100.0 fL   MCH 31.0 26.0 - 34.0 pg   MCHC 32.4 30.0 - 36.0 g/dL   RDW 16.8 (H) 11.5 - 15.5 %   Platelets 430 (H) 150 - 400 K/uL  Urinalysis, Routine w reflex microscopic (not at [facility])     Status: Abnormal   Collection Time: 02/14/15  9:39 AM  Result Value Ref Range   Color, Urine YELLOW YELLOW   APPearance CLOUDY (A) CLEAR   Specific Gravity, Urine 1.008 1.005 - 1.030   pH 7.5 5.0 - 8.0   Glucose, UA NEGATIVE NEGATIVE mg/dL   Hgb urine dipstick NEGATIVE NEGATIVE   Bilirubin Urine NEGATIVE NEGATIVE   Ketones, ur NEGATIVE NEGATIVE mg/dL   Protein, ur NEGATIVE NEGATIVE mg/dL   Nitrite NEGATIVE NEGATIVE   Leukocytes, UA NEGATIVE NEGATIVE    Comment: MICROSCOPIC NOT DONE ON URINES WITH NEGATIVE PROTEIN, BLOOD, LEUKOCYTES, NITRITE, OR GLUCOSE <1000 mg/dL.  I-Stat CG4 Lactic Acid, ED  (not at [facility])     Status: Abnormal   Collection Time: 02/14/15  9:47 AM  Result Value Ref Range   Lactic  Acid, Venous 2.25 (HH) 0.5 - 2.0 mmol/L   Comment NOTIFIED PHYSICIAN     Imaging / Studies: Ct Abdomen Pelvis W Contrast  02/12/2015  CLINICAL DATA:  Follow-up intra-abdominal abscesses and drainage catheter. History of bowel perforation and subsequent postoperative abscess. EXAM: CT ABDOMEN AND PELVIS WITH CONTRAST TECHNIQUE: Multidetector CT imaging of the abdomen and pelvis was performed using the standard protocol following bolus administration of intravenous contrast. CONTRAST:  176m ISOVUE-300 IOPAMIDOL (ISOVUE-300) INJECTION 61% COMPARISON:  None. 01/28/2015 and 01/14/2015 FINDINGS: Lower chest: There is a punctate peripheral nodule in the right lower lobe on sequence 4, image 88 which is indeterminate but probably unchanged. There continues to be a small to moderate sized left pleural effusion with compressive atelectasis. The small amount of right pleural fluid has resolved. Evidence for atelectasis or volume loss at the right lung base. Hepatobiliary: Again noted is a focal low-density structure along the posterior right hepatic lobe on sequence 3, image 15 that roughly measures 1.3 cm. This is grossly unchanged and likely represents a benign etiology such as a cyst. No gross abnormality to the liver and the portal venous system is patent. Pancreas: Normal appearance of the pancreas. The it air-fluid collection along the posterior and inferior aspect of the pancreatic body has decreased in size. No pancreatic duct dilatation. Spleen: Normal appearance of the spleen. The air-fluid collection near the splenic hilum has markedly decreased in size. There continues to be a complex air-fluid collection medial to the spleen. This area measures 8.0 x 4.4 cm on sequence 3, image 18 and previously measured 9.5 x 4.8 cm at a similar level. Adrenals/Urinary Tract: Stable fullness and possible nodule involving the right adrenal lateral limb. There is a stable fullness in the left adrenal suggestive for  hyperplasia. The right adrenal nodule could represent a small adenoma but indeterminate. No acute abnormality involving the kidneys. Normal appearance of the urinary bladder. Stomach/Bowel: Postsurgical changes associated with a left colectomy with a Hartmann's pouch. There is an air-fluid collection associated with the Hartmann's pouch on sequence 3, image 75 and this is associated with a large abscess extending along the left side of the abdomen. There continues to be low-density  material within the Hartmann's pouch but the pouch is more decompressed than the previous examination. Mesenteric stranding and inflammatory changes along the left side of the abdomen adjacent loops of small bowel. Mild wall thickening involving the proximal jejunum on sequence 3, image 50. Distal small bowel is decompressed. No gross abnormality to the right colon and there is a colostomy on the right side of the abdomen. Vascular/Lymphatic: Again noted are prominent lymph nodes in the upper abdomen. Conglomeration of nodes near the porta hepatis measures up to 2.8 cm on sequence 3, images 26 and previously measured roughly 2.3 cm. Again noted are some scattered periaortic lymph nodes. Minimal atherosclerotic disease in the right common iliac artery. No evidence for an aortic aneurysm. Reproductive: Normal appearance of the prostate and seminal vesicles. Other: Percutaneous abscess drain in the left lower quadrant the abdomen remains well positioned within the abscess cavity. The abscess cavity has been decompressed compared to the prior examination but there continues to be a large air-fluid collection extending up the left side of the abdomen. Difficult to measure the size of this abscess due to its large irregular shape. At the level of the left kidney, this collection roughly measures 10.3 x 3.5 cm and previously measured 15.3 x 8.2 cm. There continues to be additional non continuous air-fluid collections throughout the abdomen.  There is a complex air-fluid collection along the left side of the left psoas muscle on sequence 3, image 49 that measures 5.5 x 2.2 cm and previously measured 7.5 x 3.4 cm. Air-fluid collection medial to the spleen has decreased in size as described above. There continues to be small foci of air anterior to the spleen but these extraluminal collections have markedly decreased in size. Postsurgical changes along the anterior abdomen compatible with an open wound with a wound VAC. Musculoskeletal:  No acute bone abnormality. IMPRESSION: The large abscess collection along the left side of the abdomen has decreased in size but there continues to be a large amount of gas and fluid within the collection. 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 Additional air-fluid collections scattered throughout the left side of the abdomen but these have all decreased in size. Percutaneous abscess drain is well positioned along the caudal aspect of the abscess. Persistent left pleural effusion. Resolution of the right pleural fluid. Prominent lymph nodes in the periportal region. Cannot exclude interval enlargement of these nodes and recommend attention on follow up imaging. Few incidental finding, such as a right adrenal nodule and punctate right lung nodule. Recommend attention to these on follow up imaging. Electronically Signed   By: Markus Daft M.D.   On: 02/12/2015 12:52    Medications / Allergies:  Scheduled Meds:  Continuous Infusions: . sodium chloride 1,000 mL (02/14/15 0953)   PRN Meds:.  Antibiotics: Anti-infectives    None        Assessment/Plan Exploratory laparotomy, left colectomy, end colostomy---Dr. Donne Hazel 01/17/15 Rectal stump leak Pelvic abscess  The patient does not appear ill.  Abdomen is non tender.  IR drain seems to be functioning.  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   Presumably, the rectal drainage is from the abscess.  May discharge from a surgical standpoint.  Will resume his antibiotics, e coli cipro sensitive.  Follow up with IR and Dr. Donne Hazel.     Erby Pian, ANP-BC [facility]   02/14/2015 10:40 AM

## 2015-02-14 NOTE — ED Notes (Signed)
Pt. Presents with complaint of post op problem. Pt. D/C Thursday from here, saw Dr. Donne Hazel in office yesterday. Told to report to ED for new symptoms. Pt. States this AM he began having drainage from rectum (milky white discharge). Pt. Has wound vac in central abd, drain to L abd with yellow drainage and ostomy bag to R abd. Pt. States he has been running fever at home.

## 2015-02-15 LAB — URINE CULTURE

## 2015-02-16 ENCOUNTER — Ambulatory Visit (INDEPENDENT_AMBULATORY_CARE_PROVIDER_SITE_OTHER): Payer: Medicaid Other | Admitting: Pharmacist

## 2015-02-16 ENCOUNTER — Ambulatory Visit (HOSPITAL_COMMUNITY)
Admission: RE | Admit: 2015-02-16 | Discharge: 2015-02-16 | Disposition: A | Discharge status: Home / Self Care | Payer: Medicaid Other | Source: Ambulatory Visit | Attending: Nurse Practitioner | Admitting: Nurse Practitioner

## 2015-02-16 ENCOUNTER — Encounter (HOSPITAL_COMMUNITY): Payer: Self-pay | Admitting: Nurse Practitioner

## 2015-02-16 VITALS — BP 132/82 | HR 101 | Ht 70.0 in | Wt 267.0 lb

## 2015-02-16 DIAGNOSIS — I482 Chronic atrial fibrillation, unspecified: Secondary | ICD-10-CM

## 2015-02-16 DIAGNOSIS — Z7901 Long term (current) use of anticoagulants: Secondary | ICD-10-CM | POA: Diagnosis not present

## 2015-02-16 DIAGNOSIS — I4891 Unspecified atrial fibrillation: Secondary | ICD-10-CM

## 2015-02-16 DIAGNOSIS — I5022 Chronic systolic (congestive) heart failure: Secondary | ICD-10-CM | POA: Diagnosis not present

## 2015-02-16 DIAGNOSIS — Z79899 Other long term (current) drug therapy: Secondary | ICD-10-CM | POA: Diagnosis not present

## 2015-02-16 DIAGNOSIS — Z8249 Family history of ischemic heart disease and other diseases of the circulatory system: Secondary | ICD-10-CM | POA: Insufficient documentation

## 2015-02-16 DIAGNOSIS — Z5181 Encounter for therapeutic drug level monitoring: Secondary | ICD-10-CM

## 2015-02-16 DIAGNOSIS — Z91041 Radiographic dye allergy status: Secondary | ICD-10-CM | POA: Diagnosis not present

## 2015-02-16 DIAGNOSIS — Z87891 Personal history of nicotine dependence: Secondary | ICD-10-CM | POA: Insufficient documentation

## 2015-02-16 DIAGNOSIS — Z933 Colostomy status: Secondary | ICD-10-CM | POA: Diagnosis not present

## 2015-02-16 LAB — POCT INR: INR: 2.1

## 2015-02-16 NOTE — Progress Notes (Signed)
Patient ID: William Fischer, male   DOB: 07-12-1965, 50 y.o.   MRN: [id number]     Primary Care Physician: Thompson Grayer, MD Referring Physician: [facility] f/u Electrophysiologist: Dr. Dianna Limbo is a 50 y.o. 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 He reports that he is slowly recovering his strength and he has not noticed any irregular heart beat but afib was poorly controlled at 115 bpm on initial visit in afib clinic. Cardizem was increased to 120 mg bid. He was referred to coumadin clinic for transition over to warfarin since an ischemic event is what was thought to be etiology of surgical issues. He currently is on lovenox and will f/u with coumadin clinic after this visit today. He did have an ER visit last Friday for fever and is on cipro. His heart rate does appear to be better controlled today and at home reading of BP/HR have been acceptable with HR controlled between 78-96 bpm.  Today, he denies symptoms of palpitations, chest pain, shortness of breath, orthopnea, PND, lower extremity edema, dizziness, presyncope, syncope, or neurologic sequela. The patient is tolerating medications without difficulties and is otherwise without complaint today.   Past Medical History  Diagnosis Date  . Chicken pox as a child  . Migraine 06/29/2011  . Obesity 06/29/2011  . Depression with anxiety 06/29/2011  . Atrial fibrillation ([place])     admx 11/13 with acute sCHF in setting of RVR  => a. failed DCCV x 2; b. Pradaxa started;  c. failed sotalol  . Chronic systolic heart failure ([place])     a. echo 11/13: Ef 40-45%, diff HK, mod MR, mod LAE, mild RVE, mod RAE, small effusion;   b. TEE 11/13:  EF 35-40%, no LAA clot; Echo 2/14 shows normal  EF  . 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 Snoring     patient needs sleep study - has declined  . Allergy to IVP dye   . Chronic anticoagulation     Pradaxa   Past Surgical History  Procedure Laterality Date  . Tee without cardioversion  11/30/2011    Procedure: TRANSESOPHAGEAL ECHOCARDIOGRAM (TEE);  Surgeon: Thayer Headings, MD;  Location: [place];  Service: Cardiovascular;  Laterality: N/A;  . Cardioversion  11/30/2011    Procedure: CARDIOVERSION;  Surgeon: Thayer Headings, MD;  Location: [place];  Service: Cardiovascular;  Laterality: N/A;  . Cardioversion  12/03/2011    Procedure: CARDIOVERSION;  Surgeon: Jolaine Artist, MD;  Location: [place];  Service: Cardiovascular;  Laterality: N/A;  . Laparotomy N/A 01/17/2015    Procedure: EXPLORATORY LAPAROTOMY WITH LEFT COLECTOMY AND COLOSTOMY;  Surgeon: Rolm Bookbinder, MD;  Location: [place];  Service: General;  Laterality: N/A;    Current Outpatient Prescriptions  Medication Sig Dispense Refill  . ciprofloxacin (CIPRO) 500 MG tablet Take 1 tablet (500 mg total) by mouth 2 (two) times daily. 28 tablet 0  . digoxin (LANOXIN) 0.125 MG tablet Take 1 tablet (0.125 mg total) by mouth daily. 30 tablet 0  . diltiazem (CARDIZEM CD) 120 MG 24 hr capsule Take 1 capsule (120 mg total) by mouth 2 (two) times daily. 30 capsule 0  . feeding supplement (BOOST / RESOURCE BREEZE) LIQD  Take 1 Container by mouth 3 (three) times daily between meals. 12 Container 0  . lisinopril (PRINIVIL,ZESTRIL) 5 MG tablet Take 5 mg by mouth daily.   3  . Metoprolol Tartrate 75 MG TABS Take 150 mg by mouth 2 (two) times daily. 60 tablet 0  . OXYCONTIN 20 MG 12 hr tablet Take 20 mg by mouth 2 (two) times daily as needed (for pain).   0  . spironolactone (ALDACTONE) 25 MG tablet Take 25 mg by mouth daily.  10  . warfarin (COUMADIN) 5 MG tablet Take 1 tablet (5 mg total) by mouth daily. Take as directed by the Coumadin  clinic. 30 tablet 0  . [DISCONTINUED] furosemide (LASIX) 40 MG tablet Take 1 tablet (40 mg total) by mouth daily. (Patient not taking: Reported on 02/14/2015) 30 tablet 3   No current facility-administered medications for this encounter.    Allergies  Allergen Reactions  . Contrast Media [Iodinated Diagnostic Agents] Rash    a diffuse macular rash after CTA chest  Pt was premedicated with 125mg  IV Solumedrol, 50mg  IV Benadryl 1 hr prior to CTexam, and tolerated procedure without any difficulties.    Social History   Social History  . Marital Status: Divorced    Spouse Name: N/A  . Number of Children: N/A  . Years of Education: N/A   Occupational History  . Not on file.   Social History Main Topics  . Smoking status: Former Smoker -- 1.00 packs/day for 20 years    Types: Cigarettes    Quit date: 07/10/2014  . Smokeless tobacco: Never Used  . Alcohol Use: No  . Drug Use: No  . Sexual Activity: No   Other Topics Concern  . Not on file   Social History Narrative    Family History  Problem Relation Age of Onset  . Hypertension Mother   . Hypertension Father   . Aneurysm Father   . Alcohol abuse Father   . Cancer Maternal Grandmother     brain  . Diabetes Maternal Grandfather     type 2  . Parkinsonism Maternal Grandfather   . Cancer Paternal Grandmother     breast  . Diabetes Paternal Grandmother   . Alcohol abuse Paternal Grandfather     ROS- All systems are reviewed and negative except as per the HPI above  Physical Exam: Filed Vitals:   02/16/15 1111  BP: 132/82  Pulse: 101  Height: 5\' 10"  (1.778 m)  Weight: 267 lb (121.11 kg)    GEN- The patient is well appearing, alert and oriented x 3 today.   Head- normocephalic, atraumatic Eyes-  Sclera clear, conjunctiva pink Ears- hearing intact Oropharynx- clear Neck- supple, no JVP Lymph- no cervical lymphadenopathy Lungs- Clear to ausculation bilaterally, normal work of breathing Heart- Regular rate  and rhythm, no murmurs, rubs or gallops, PMI not laterally displaced GI- soft, NT, ND, + BS Extremities- no clubbing, cyanosis, or edema MS- no significant deformity or atrophy Skin- no rash or lesion Psych- euthymic mood, full affect Neuro- strength and sensation are intact  EKG-Afib with  V rate of 101 bpm,QRS int 84 ms, QTc 430 ms.  Epic records reviewed   Assessment and Plan: 1. Chronic afib with rvr probably secondary to recent infection and surgery Better controlled with increase of 120 mg bid Continue f/u with coumadin clinic for transition over to coumadin, on lovenox shots  2. Abscess perforation s/p colectomy/colostomy On cipro since 1/28(ED visit) with recent fever F/u with surgeon as  scheduled Home health nurse is in the home 3x q week    F/u in [place] in one month   [place]. Cruzito Standre, [facility] [address] [place], [place] [address] [phone]   Addendum- After further in depth review of all notes, on d/c summary, Dx was diverticulitis with abscess with perf/ colectotomy/colostomy. However per surgical note, Dr. Donne Hazel, surgeon, noted that that the issue with  bowel did not appear to be diverticulitis, but left colon and splenic fixture appeared necrotic. This prompted Dr. Rayann Heman to suggest coumadin instead of pradaxa,pt was switched to levenox/heparin/ coumadin. However, his INR's were very erratic and the decision was made by internal medicine/ general cardiology to return to use of pradaxa, which he was discharged on. However, Dr. Rayann Heman still fells that pt would be better served with Coumadin to prevent future recurrent thrombo/embolic events. I have contacted the coumadin clinic and they will call the pt and schedule appointment to start coumadin. Pt made aware.

## 2015-02-20 ENCOUNTER — Other Ambulatory Visit: Payer: Self-pay | Admitting: *Deleted

## 2015-02-21 ENCOUNTER — Inpatient Hospital Stay (HOSPITAL_COMMUNITY)
Admission: EM | Admit: 2015-02-21 | Discharge: 2015-02-24 | DRG: 862 | Disposition: A | Discharge status: Home Health | Payer: Medicaid Other | Attending: Surgery | Admitting: Surgery

## 2015-02-21 ENCOUNTER — Encounter (HOSPITAL_COMMUNITY): Payer: Self-pay | Admitting: Emergency Medicine

## 2015-02-21 DIAGNOSIS — Z5181 Encounter for therapeutic drug level monitoring: Secondary | ICD-10-CM

## 2015-02-21 DIAGNOSIS — Z933 Colostomy status: Secondary | ICD-10-CM

## 2015-02-21 DIAGNOSIS — Z91041 Radiographic dye allergy status: Secondary | ICD-10-CM

## 2015-02-21 DIAGNOSIS — I482 Chronic atrial fibrillation: Secondary | ICD-10-CM | POA: Diagnosis present

## 2015-02-21 DIAGNOSIS — I5022 Chronic systolic (congestive) heart failure: Secondary | ICD-10-CM | POA: Diagnosis present

## 2015-02-21 DIAGNOSIS — I429 Cardiomyopathy, unspecified: Secondary | ICD-10-CM | POA: Diagnosis present

## 2015-02-21 DIAGNOSIS — J90 Pleural effusion, not elsewhere classified: Secondary | ICD-10-CM

## 2015-02-21 DIAGNOSIS — Z87891 Personal history of nicotine dependence: Secondary | ICD-10-CM

## 2015-02-21 DIAGNOSIS — F418 Other specified anxiety disorders: Secondary | ICD-10-CM | POA: Diagnosis present

## 2015-02-21 DIAGNOSIS — Z7901 Long term (current) use of anticoagulants: Secondary | ICD-10-CM

## 2015-02-21 DIAGNOSIS — K651 Peritoneal abscess: Secondary | ICD-10-CM

## 2015-02-21 DIAGNOSIS — E669 Obesity, unspecified: Secondary | ICD-10-CM | POA: Diagnosis present

## 2015-02-21 DIAGNOSIS — Z23 Encounter for immunization: Secondary | ICD-10-CM

## 2015-02-21 DIAGNOSIS — T814XXA Infection following a procedure, initial encounter: Principal | ICD-10-CM | POA: Diagnosis present

## 2015-02-21 DIAGNOSIS — I10 Essential (primary) hypertension: Secondary | ICD-10-CM | POA: Diagnosis present

## 2015-02-21 DIAGNOSIS — I959 Hypotension, unspecified: Secondary | ICD-10-CM | POA: Diagnosis present

## 2015-02-21 DIAGNOSIS — Y838 Other surgical procedures as the cause of abnormal reaction of the patient, or of later complication, without mention of misadventure at the time of the procedure: Secondary | ICD-10-CM | POA: Diagnosis present

## 2015-02-21 DIAGNOSIS — I4891 Unspecified atrial fibrillation: Secondary | ICD-10-CM | POA: Diagnosis present

## 2015-02-21 LAB — I-STAT CG4 LACTIC ACID, ED: Lactic Acid, Venous: 1.08 mmol/L (ref 0.5–2.0)

## 2015-02-21 MED ORDER — SODIUM CHLORIDE 0.9 % IV BOLUS (SEPSIS)
500.0000 mL | Freq: Once | INTRAVENOUS | Status: AC
  Administered 2015-02-22: 500 mL via INTRAVENOUS

## 2015-02-21 MED ORDER — PIPERACILLIN-TAZOBACTAM 3.375 G IVPB 30 MIN
3.3750 g | Freq: Once | INTRAVENOUS | Status: AC
  Administered 2015-02-22: 3.375 g via INTRAVENOUS
  Filled 2015-02-21: qty 50

## 2015-02-21 MED ORDER — VANCOMYCIN HCL IN DEXTROSE 1-5 GM/200ML-% IV SOLN: 1000.0000 mg | Freq: Once | INTRAVENOUS | Status: DC

## 2015-02-21 MED ORDER — METHYLPREDNISOLONE SODIUM SUCC 125 MG IJ SOLR
125.0000 mg | Freq: Once | INTRAMUSCULAR | Status: AC
  Administered 2015-02-22: 125 mg via INTRAVENOUS
  Filled 2015-02-21: qty 2

## 2015-02-21 MED ORDER — ONDANSETRON HCL 4 MG/2ML IJ SOLN
4.0000 mg | Freq: Once | INTRAMUSCULAR | Status: AC
  Administered 2015-02-22: 4 mg via INTRAVENOUS
  Filled 2015-02-21: qty 2

## 2015-02-21 MED ORDER — SODIUM CHLORIDE 0.9 % IV BOLUS (SEPSIS)
1000.0000 mL | Freq: Once | INTRAVENOUS | Status: AC
  Administered 2015-02-22: 1000 mL via INTRAVENOUS

## 2015-02-21 MED ORDER — VANCOMYCIN HCL 10 G IV SOLR
2000.0000 mg | Freq: Once | INTRAVENOUS | Status: AC
  Administered 2015-02-22: 2000 mg via INTRAVENOUS
  Filled 2015-02-21: qty 2000

## 2015-02-21 MED ORDER — SODIUM CHLORIDE 0.9 % IV BOLUS (SEPSIS)
2000.0000 mL | Freq: Once | INTRAVENOUS | Status: AC
  Administered 2015-02-21: 2000 mL via INTRAVENOUS

## 2015-02-21 MED ORDER — DIPHENHYDRAMINE HCL 50 MG/ML IJ SOLN
50.0000 mg | Freq: Once | INTRAMUSCULAR | Status: AC
  Administered 2015-02-22: 50 mg via INTRAVENOUS
  Filled 2015-02-21: qty 1

## 2015-02-21 MED ORDER — MORPHINE SULFATE (PF) 4 MG/ML IV SOLN
4.0000 mg | Freq: Once | INTRAVENOUS | Status: AC
  Administered 2015-02-22: 4 mg via INTRAVENOUS
  Filled 2015-02-21: qty 1

## 2015-02-21 NOTE — ED Notes (Addendum)
Pt states that he has been having pain at his incision site for past two days. Pt is leaking fluids at the site, and requires constant bandage changes. Pt with low grade fever and hypotensive at this time.

## 2015-02-21 NOTE — ED Provider Notes (Signed)
CSN: [id number]     Arrival date & time 02/21/15  2155 History   First MD Initiated Contact with Patient 02/21/15 2238     Chief Complaint  Patient presents with  . Abdominal Pain     (Consider location/radiation/quality/duration/timing/severity/associated sxs/prior Treatment) HPI   Blood pressure 90/56, pulse 102, temperature 99.7 F (37.6 C), temperature source Oral, resp. rate 24, SpO2 94 %.  William Fischer is a 50 y.o. 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   Past Medical History  Diagnosis Date  . Chicken pox as a child  . Migraine 06/29/2011  . Obesity 06/29/2011  . Depression with anxiety 06/29/2011  . Atrial fibrillation ([place])     admx 11/13 with acute sCHF in setting of RVR  => a. failed DCCV x 2; b. Pradaxa started;  c. failed sotalol  . Chronic systolic heart failure ([place])     a. echo 11/13: Ef 40-45%, diff HK, mod MR, mod LAE, mild RVE, mod RAE, small effusion;   b. TEE 11/13:  EF 35-40%, no LAA clot; Echo 2/14 shows normal EF  . 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 Snoring     patient needs sleep study - has declined  . Allergy to IVP dye   . Chronic anticoagulation     Pradaxa   Past Surgical History  Procedure Laterality Date  . Tee without cardioversion  11/30/2011    Procedure: TRANSESOPHAGEAL ECHOCARDIOGRAM (TEE);  Surgeon: Thayer Headings, MD;  Location: [place];  Service: Cardiovascular;  Laterality: N/A;  . Cardioversion  11/30/2011    Procedure: CARDIOVERSION;  Surgeon: Thayer Headings, MD;  Location: [place];  Service: Cardiovascular;  Laterality: N/A;  . Cardioversion  12/03/2011    Procedure: CARDIOVERSION;   Surgeon: Jolaine Artist, MD;  Location: [place];  Service: Cardiovascular;  Laterality: N/A;  . Laparotomy N/A 01/17/2015    Procedure: EXPLORATORY LAPAROTOMY WITH LEFT COLECTOMY AND COLOSTOMY;  Surgeon: Rolm Bookbinder, MD;  [facility];  Service: General;  Laterality: N/A;   Family History  Problem Relation Age of Onset  . Hypertension Mother   . Hypertension Father   . Aneurysm Father   . Alcohol abuse Father   . Cancer Maternal Grandmother     brain  . Diabetes Maternal Grandfather     type 2  . Parkinsonism Maternal Grandfather   . Cancer Paternal Grandmother     breast  . Diabetes Paternal Grandmother   . Alcohol abuse Paternal Grandfather    Social History  Substance Use Topics  . Smoking status: Former Smoker -- 1.00 packs/day for 20 years    Types: Cigarettes    Quit date: 07/10/2014  . Smokeless tobacco: Never Used  . Alcohol Use: No    Review of Systems    Allergies  Contrast media  Home Medications   Prior to Admission medications   Medication Sig Start Date End Date Taking? Authorizing Provider  acetaminophen (TYLENOL) 500 MG tablet Take 500-1,000 mg by mouth every 6 (six) hours as needed for mild pain or headache.   Yes Historical Provider, MD  cefdinir (OMNICEF) 300 MG capsule Take 300 mg by mouth 2 (two) times daily.  02/02/15  Yes Historical Provider, MD  ciprofloxacin (CIPRO) 500 MG tablet Take 1 tablet (500 mg total) by mouth 2 (two) times daily. 02/14/15  Yes Carlisle Cater, PA-C  digoxin (LANOXIN) 0.125 MG tablet Take 1 tablet (0.125 mg total) by mouth daily. 02/02/15  Yes Liberty Handy, MD  diltiazem (CARDIZEM CD) 120 MG 24 hr capsule Take 1 capsule (120 mg total) by mouth 2 (two) times daily. 02/09/15  Yes Sherran Needs, NP  feeding supplement (BOOST / RESOURCE BREEZE) LIQD Take 1 Container by mouth 3 (three) times daily between meals. 02/02/15  Yes Liberty Handy, MD  furosemide (LASIX) 40 MG tablet Take 40 mg by mouth daily.   Yes Historical  Provider, MD  lisinopril (PRINIVIL,ZESTRIL) 5 MG tablet Take 5 mg by mouth daily.  01/01/15  Yes Historical Provider, MD  Metoprolol Tartrate 75 MG TABS Take 150 mg by mouth 2 (two) times daily. 02/02/15  Yes Liberty Handy, MD  warfarin (COUMADIN) 5 MG tablet Take 1 tablet (5 mg total) by mouth daily. Take as directed by the Coumadin clinic. 02/11/15  Yes Thompson Grayer, MD   BP 90/56 mmHg  Pulse 102  Temp(Src) 99.7 F (37.6 C) (Oral)  Resp 24  SpO2 94% Physical Exam  Constitutional: He is oriented to person, place, and time. He appears well-developed and well-nourished. No distress.  HENT:  Head: Normocephalic.  Eyes: Conjunctivae and EOM are normal.  Cardiovascular: Normal rate.   Pulmonary/Chest: Effort normal. No stridor.  Abdominal: Soft. There is tenderness.  Midline incision with wound VAC in place, right-sided colostomy. Left-sided drain with purulent drainage out of the drain and significant, foul-smelling purulent drainage around the drain tube with cellulitis to the surrounding skin.  Musculoskeletal: Normal range of motion.  Neurological: He is alert and oriented to person, place, and time.  Psychiatric: He has a normal mood and affect.  Nursing note and vitals reviewed.   ED Course  Procedures (including critical care time) Labs Review Labs Reviewed  CBC WITH DIFFERENTIAL/PLATELET - Abnormal; Notable for the following:    RBC 3.59 (*)    Hemoglobin 11.3 (*)    HCT 34.3 (*)    RDW 16.7 (*)    Platelets 451 (*)    Monocytes Absolute 1.8 (*)    All other components within normal limits  COMPREHENSIVE METABOLIC PANEL - Abnormal; Notable for the following:    Chloride 100 (*)    Glucose, Bld 115 (*)    Calcium 8.5 (*)    Albumin 2.0 (*)    All other components within normal limits  PROTIME-INR - Abnormal; Notable for the following:    Prothrombin Time 33.8 (*)    INR 3.42 (*)    All other components within normal limits  CULTURE, BLOOD (ROUTINE X 2)  CULTURE, BLOOD  (ROUTINE X 2)  URINE CULTURE  URINALYSIS, ROUTINE W REFLEX MICROSCOPIC (NOT AT [facility])  I-STAT CG4 LACTIC ACID, ED    Imaging Review No results found. I have personally reviewed and evaluated these images and lab results as part of my medical decision-making.   EKG Interpretation None      MDM   Final diagnoses:  None    Filed Vitals:   02/21/15 2215 02/21/15 2230 02/21/15 2245 02/21/15 2300  BP: 101/50 106/58 103/53 108/57  Pulse: 98 109 110 130  Temp:      TempSrc:      Resp: 28 25 28 27   SpO2: 94% 91% 93% 94%    Medications  morphine 4  MG/ML injection 4 mg (not administered)  ondansetron (ZOFRAN) injection 4 mg (not administered)  sodium chloride 0.9 % bolus 1,000 mL (not administered)    Followed by  sodium chloride 0.9 % bolus 1,000 mL (not administered)    Followed by  sodium chloride 0.9 % bolus 500 mL (not administered)  piperacillin-tazobactam (ZOSYN) IVPB 3.375 g (not administered)  vancomycin (VANCOCIN) IVPB 1000 mg/200 mL premix (not administered)  sodium chloride 0.9 % bolus 2,000 mL (2,000 mLs Intravenous New Bag/Given 02/21/15 2315)    William Fischer is 50 y.o. male presenting with increasing pain to site of drain on the left lower quadrant, there is a foul-smelling purulent discharge. Patient reports fever at home, is tachycardic in the 130s with a soft blood pressure systolic in the 0000000. Sepsis protocol initiated and patient started on Vanco and Zosyn, pancultured.  Normal lactic acid, no leukocytosis. Agent is pretreated for a CAT scan, protocol states he will have to drink for 2 hours. Case signed out to attending Dr. Roxanne Mins who will follow this up when CAT scan has resulted.    Monico Blitz, PA-C [id number]  Delora Fuel, MD 0000000 [id number]

## 2015-02-22 ENCOUNTER — Encounter (HOSPITAL_COMMUNITY): Payer: Self-pay | Admitting: Radiology

## 2015-02-22 ENCOUNTER — Emergency Department (HOSPITAL_COMMUNITY): Payer: Medicaid Other

## 2015-02-22 DIAGNOSIS — I482 Chronic atrial fibrillation: Secondary | ICD-10-CM | POA: Diagnosis not present

## 2015-02-22 DIAGNOSIS — K651 Peritoneal abscess: Secondary | ICD-10-CM | POA: Diagnosis not present

## 2015-02-22 DIAGNOSIS — T814XXA Infection following a procedure, initial encounter: Secondary | ICD-10-CM | POA: Diagnosis not present

## 2015-02-22 DIAGNOSIS — Y838 Other surgical procedures as the cause of abnormal reaction of the patient, or of later complication, without mention of misadventure at the time of the procedure: Secondary | ICD-10-CM | POA: Diagnosis present

## 2015-02-22 DIAGNOSIS — Z7901 Long term (current) use of anticoagulants: Secondary | ICD-10-CM | POA: Diagnosis not present

## 2015-02-22 DIAGNOSIS — I5022 Chronic systolic (congestive) heart failure: Secondary | ICD-10-CM | POA: Diagnosis present

## 2015-02-22 DIAGNOSIS — F418 Other specified anxiety disorders: Secondary | ICD-10-CM | POA: Diagnosis present

## 2015-02-22 DIAGNOSIS — I429 Cardiomyopathy, unspecified: Secondary | ICD-10-CM | POA: Diagnosis present

## 2015-02-22 DIAGNOSIS — Z87891 Personal history of nicotine dependence: Secondary | ICD-10-CM | POA: Diagnosis not present

## 2015-02-22 DIAGNOSIS — I959 Hypotension, unspecified: Secondary | ICD-10-CM | POA: Diagnosis present

## 2015-02-22 DIAGNOSIS — I10 Essential (primary) hypertension: Secondary | ICD-10-CM | POA: Diagnosis present

## 2015-02-22 DIAGNOSIS — Z91041 Radiographic dye allergy status: Secondary | ICD-10-CM | POA: Diagnosis not present

## 2015-02-22 DIAGNOSIS — E669 Obesity, unspecified: Secondary | ICD-10-CM | POA: Diagnosis present

## 2015-02-22 DIAGNOSIS — Z23 Encounter for immunization: Secondary | ICD-10-CM | POA: Diagnosis not present

## 2015-02-22 DIAGNOSIS — Z933 Colostomy status: Secondary | ICD-10-CM | POA: Diagnosis not present

## 2015-02-22 LAB — CBC WITH DIFFERENTIAL/PLATELET
BASOS ABS: 0 10*3/uL (ref 0.0–0.1)
BASOS PCT: 0 %
Eosinophils Absolute: 0.2 10*3/uL (ref 0.0–0.7)
Eosinophils Relative: 2 %
HEMATOCRIT: 34.3 % — AB (ref 39.0–52.0)
HEMOGLOBIN: 11.3 g/dL — AB (ref 13.0–17.0)
LYMPHS PCT: 26 %
Lymphs Abs: 2.5 10*3/uL (ref 0.7–4.0)
MCH: 31.5 pg (ref 26.0–34.0)
MCHC: 32.9 g/dL (ref 30.0–36.0)
MCV: 95.5 fL (ref 78.0–100.0)
Monocytes Absolute: 1.8 10*3/uL — ABNORMAL HIGH (ref 0.1–1.0)
Monocytes Relative: 19 %
NEUTROS ABS: 5.3 10*3/uL (ref 1.7–7.7)
NEUTROS PCT: 53 %
Platelets: 451 10*3/uL — ABNORMAL HIGH (ref 150–400)
RBC: 3.59 MIL/uL — AB (ref 4.22–5.81)
RDW: 16.7 % — AB (ref 11.5–15.5)
WBC: 9.9 10*3/uL (ref 4.0–10.5)

## 2015-02-22 LAB — URINALYSIS, ROUTINE W REFLEX MICROSCOPIC
BILIRUBIN URINE: NEGATIVE
GLUCOSE, UA: NEGATIVE mg/dL
HGB URINE DIPSTICK: NEGATIVE
Ketones, ur: NEGATIVE mg/dL
Leukocytes, UA: NEGATIVE
Nitrite: NEGATIVE
PROTEIN: 30 mg/dL — AB
Specific Gravity, Urine: 1.025 (ref 1.005–1.030)
pH: 5 (ref 5.0–8.0)

## 2015-02-22 LAB — URINE MICROSCOPIC-ADD ON

## 2015-02-22 LAB — COMPREHENSIVE METABOLIC PANEL
ALBUMIN: 2 g/dL — AB (ref 3.5–5.0)
ALK PHOS: 57 U/L (ref 38–126)
ALT: 42 U/L (ref 17–63)
AST: 30 U/L (ref 15–41)
Anion gap: 12 (ref 5–15)
BILIRUBIN TOTAL: 0.4 mg/dL (ref 0.3–1.2)
BUN: 12 mg/dL (ref 6–20)
CO2: 26 mmol/L (ref 22–32)
CREATININE: 0.85 mg/dL (ref 0.61–1.24)
Calcium: 8.5 mg/dL — ABNORMAL LOW (ref 8.9–10.3)
Chloride: 100 mmol/L — ABNORMAL LOW (ref 101–111)
GFR calc Af Amer: >60 mL/min (ref >60)
GFR calc non Af Amer: >60 mL/min (ref >60)
GLUCOSE: 115 mg/dL — AB (ref 65–99)
POTASSIUM: 4.2 mmol/L (ref 3.5–5.1)
Sodium: 138 mmol/L (ref 135–145)
TOTAL PROTEIN: 6.6 g/dL (ref 6.5–8.1)

## 2015-02-22 LAB — PROTIME-INR
INR: 3.42 — AB (ref 0.00–1.49)
Prothrombin Time: 33.8 seconds — ABNORMAL HIGH (ref 11.6–15.2)

## 2015-02-22 MED ORDER — IOHEXOL 300 MG/ML  SOLN
100.0000 mL | Freq: Once | INTRAMUSCULAR | Status: AC | PRN
  Administered 2015-02-22: 100 mL via INTRAVENOUS

## 2015-02-22 MED ORDER — METOPROLOL TARTRATE 25 MG PO TABS
150.0000 mg | ORAL_TABLET | Freq: Once | ORAL | Status: AC
  Administered 2015-02-22: 75 mg via ORAL
  Filled 2015-02-22: qty 6

## 2015-02-22 MED ORDER — BOOST / RESOURCE BREEZE PO LIQD
1.0000 | Freq: Three times a day (TID) | ORAL | Status: DC
  Administered 2015-02-22 – 2015-02-24 (×7): 1 via ORAL
  Filled 2015-02-22 (×2): qty 1

## 2015-02-22 MED ORDER — POTASSIUM CHLORIDE IN NACL 20-0.9 MEQ/L-% IV SOLN
INTRAVENOUS | Status: DC
  Administered 2015-02-22 – 2015-02-24 (×5): via INTRAVENOUS
  Filled 2015-02-22 (×7): qty 1000

## 2015-02-22 MED ORDER — BARIUM SULFATE 2.1 % PO SUSP
ORAL | Status: AC
  Administered 2015-02-22: 01:00:00
  Filled 2015-02-22: qty 2

## 2015-02-22 MED ORDER — DILTIAZEM HCL 100 MG IV SOLR
5.0000 mg/h | Freq: Once | INTRAVENOUS | Status: AC
  Administered 2015-02-22: 15 mg/h via INTRAVENOUS

## 2015-02-22 MED ORDER — PIPERACILLIN-TAZOBACTAM 3.375 G IVPB
3.3750 g | Freq: Three times a day (TID) | INTRAVENOUS | Status: DC
  Administered 2015-02-22 – 2015-02-24 (×7): 3.375 g via INTRAVENOUS
  Filled 2015-02-22 (×10): qty 50

## 2015-02-22 MED ORDER — ONDANSETRON 4 MG PO TBDP
4.0000 mg | ORAL_TABLET | Freq: Four times a day (QID) | ORAL | Status: DC | PRN
  Filled 2015-02-22: qty 1

## 2015-02-22 MED ORDER — ONDANSETRON HCL 4 MG/2ML IJ SOLN: 4.0000 mg | Freq: Four times a day (QID) | INTRAMUSCULAR | Status: DC | PRN

## 2015-02-22 MED ORDER — ACETAMINOPHEN 650 MG RE SUPP: 650.0000 mg | Freq: Four times a day (QID) | RECTAL | Status: DC | PRN

## 2015-02-22 MED ORDER — DILTIAZEM HCL ER COATED BEADS 120 MG PO CP24: 120.0000 mg | ORAL_CAPSULE | Freq: Two times a day (BID) | ORAL | Status: DC

## 2015-02-22 MED ORDER — DILTIAZEM HCL 100 MG IV SOLR
5.0000 mg/h | Freq: Once | INTRAVENOUS | Status: AC
  Administered 2015-02-22: 5 mg/h via INTRAVENOUS
  Filled 2015-02-22: qty 100

## 2015-02-22 MED ORDER — LISINOPRIL 5 MG PO TABS
5.0000 mg | ORAL_TABLET | Freq: Every day | ORAL | Status: DC
  Administered 2015-02-22 – 2015-02-24 (×2): 5 mg via ORAL
  Filled 2015-02-22 (×3): qty 1

## 2015-02-22 MED ORDER — ACETAMINOPHEN 325 MG PO TABS: 650.0000 mg | ORAL_TABLET | Freq: Four times a day (QID) | ORAL | Status: DC | PRN

## 2015-02-22 MED ORDER — DIGOXIN 125 MCG PO TABS
0.1250 mg | ORAL_TABLET | Freq: Every day | ORAL | Status: DC
  Administered 2015-02-22 – 2015-02-24 (×2): 0.125 mg via ORAL
  Filled 2015-02-22 (×3): qty 1

## 2015-02-22 MED ORDER — HYDROMORPHONE HCL 1 MG/ML IJ SOLN
1.0000 mg | INTRAMUSCULAR | Status: DC | PRN
  Administered 2015-02-22 – 2015-02-24 (×6): 1 mg via INTRAVENOUS
  Filled 2015-02-22 (×6): qty 1

## 2015-02-22 MED ORDER — OXYCODONE HCL 5 MG PO TABS
5.0000 mg | ORAL_TABLET | ORAL | Status: DC | PRN
  Administered 2015-02-22 – 2015-02-24 (×5): 10 mg via ORAL
  Filled 2015-02-22 (×5): qty 2

## 2015-02-22 MED ORDER — DILTIAZEM HCL 100 MG IV SOLR
INTRAVENOUS | Status: AC
  Filled 2015-02-22: qty 100

## 2015-02-22 MED ORDER — VANCOMYCIN HCL IN DEXTROSE 1-5 GM/200ML-% IV SOLN: 1000.0000 mg | Freq: Three times a day (TID) | INTRAVENOUS | Status: DC

## 2015-02-22 MED ORDER — METOPROLOL TARTRATE 25 MG PO TABS
150.0000 mg | ORAL_TABLET | Freq: Two times a day (BID) | ORAL | Status: DC
  Administered 2015-02-22 – 2015-02-24 (×4): 75 mg via ORAL
  Filled 2015-02-22 (×4): qty 6

## 2015-02-22 NOTE — Progress Notes (Signed)
MD notified about elevated heart rate patient was due to switch off Cardizem drip to PO but MD agrees patient needs to continue to stay on drip overnight for better heart rate control. Patient stable and I will continue to monitor.

## 2015-02-22 NOTE — Progress Notes (Signed)
Received results for POSITIVE blood cultures and I spoke with Jonni Sanger, [facility]  To confirm antibiotic that he is taking will cover and it will. I will continue to monitor patient.

## 2015-02-22 NOTE — ED Notes (Signed)
After receiving report, this RN entered patient room to find hr at 172 bpm in atrial fibrillation, O2 sats at 89% on RA, placed patient on 2L nasal cannula. O2 sats up to 95%. Will page admitting.

## 2015-02-22 NOTE — ED Notes (Signed)
Ostomy emptied of 150cc liquid stool

## 2015-02-22 NOTE — H&P (Signed)
William Fischer is an 50 y.o. male.   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 Donne Hazel. He has multiple comorbidities including A. fib on chronic anticoagulation. His postoperative course was complicated by a leak of the rectal stump. He developed a large left-sided intra-abdominal abscess requiring interventional radiology to place a drain. He is having increased pain at the drain site and purulent drainage around the catheter. He denies nausea or vomiting. He has had plenty of output from his colostomy.  He does report fever.  He has already been scheduled for a repeat CAT scan on February 9 by interventional radiology.  The patient is actually asking that the drain be removed.  Past Medical History  Diagnosis Date  . Chicken pox as a child  . Migraine 06/29/2011  . Obesity 06/29/2011  . Depression with anxiety 06/29/2011  . Atrial fibrillation ([place])     admx 11/13 with acute sCHF in setting of RVR  => a. failed DCCV x 2; b. Pradaxa started;  c. failed sotalol  . Chronic systolic heart failure ([place])     a. echo 11/13: Ef 40-45%, diff HK, mod MR, mod LAE, mild RVE, mod RAE, small effusion;   b. TEE 11/13:  EF 35-40%, no LAA clot; Echo 2/14 shows normal EF  . 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 Snoring     patient needs sleep study - has declined  . Allergy to IVP dye   . Chronic anticoagulation     Pradaxa    Past Surgical History  Procedure Laterality Date  . Tee without cardioversion  11/30/2011    Procedure: TRANSESOPHAGEAL ECHOCARDIOGRAM (TEE);  Surgeon: Thayer Headings, MD;  Location: [place];  Service: Cardiovascular;  Laterality: N/A;  . Cardioversion  11/30/2011    Procedure: CARDIOVERSION;  Surgeon: Thayer Headings, MD;  Location: [place];  Service: Cardiovascular;  Laterality: N/A;  .  Cardioversion  12/03/2011    Procedure: CARDIOVERSION;  Surgeon: Jolaine Artist, MD;  Location: [employer];  Service: Cardiovascular;  Laterality: N/A;  . Laparotomy N/A 01/17/2015    Procedure: EXPLORATORY LAPAROTOMY WITH LEFT COLECTOMY AND COLOSTOMY;  Surgeon: Rolm Bookbinder, MD;  Location: [facility];  Service: General;  Laterality: N/A;    Family History  Problem Relation Age of Onset  . Hypertension Mother   . Hypertension Father   . Aneurysm Father   . Alcohol abuse Father   . Cancer Maternal Grandmother     brain  . Diabetes Maternal Grandfather     type 2  . Parkinsonism Maternal Grandfather   . Cancer Paternal Grandmother     breast  . Diabetes Paternal Grandmother   . Alcohol abuse Paternal Grandfather    Social History:  reports that he quit smoking about 7 months ago. His smoking use included Cigarettes. He has a 20 pack-year smoking history. He has never used smokeless tobacco. He reports that he does not drink alcohol or use illicit drugs.  Allergies:  Allergies  Allergen Reactions  . Contrast Media [Iodinated Diagnostic Agents] Rash    a diffuse macular rash after CTA chest  Pt was premedicated with 118m IV Solumedrol, 575mIV Benadryl 1 hr prior to CTexam, and tolerated procedure without any difficulties. 11/28/11 Also same pre med protocol observed on 02/22/15 and pt tolerated the procedure well     (  Not in a hospital admission)  Results for orders placed or performed during the hospital encounter of 02/21/15 (from the past 48 hour(s))  CBC with Differential/Platelet     Status: Abnormal   Collection Time: 02/21/15 11:13 PM  Result Value Ref Range   WBC 9.9 4.0 - 10.5 K/uL   RBC 3.59 (L) 4.22 - 5.81 MIL/uL   Hemoglobin 11.3 (L) 13.0 - 17.0 g/dL   HCT 34.3 (L) 39.0 - 52.0 %   MCV 95.5 78.0 - 100.0 fL   MCH 31.5 26.0 - 34.0 pg   MCHC 32.9 30.0 - 36.0 g/dL   RDW 16.7 (H) 11.5 - 15.5 %   Platelets 451 (H) 150 - 400 K/uL   Neutrophils Relative % 53 %   Neutro  Abs 5.3 1.7 - 7.7 K/uL   Lymphocytes Relative 26 %   Lymphs Abs 2.5 0.7 - 4.0 K/uL   Monocytes Relative 19 %   Monocytes Absolute 1.8 (H) 0.1 - 1.0 K/uL   Eosinophils Relative 2 %   Eosinophils Absolute 0.2 0.0 - 0.7 K/uL   Basophils Relative 0 %   Basophils Absolute 0.0 0.0 - 0.1 K/uL  Comprehensive metabolic panel     Status: Abnormal   Collection Time: 02/21/15 11:13 PM  Result Value Ref Range   Sodium 138 135 - 145 mmol/L   Potassium 4.2 3.5 - 5.1 mmol/L   Chloride 100 (L) 101 - 111 mmol/L   CO2 26 22 - 32 mmol/L   Glucose, Bld 115 (H) 65 - 99 mg/dL   BUN 12 6 - 20 mg/dL   Creatinine, Ser 0.85 0.61 - 1.24 mg/dL   Calcium 8.5 (L) 8.9 - 10.3 mg/dL   Total Protein 6.6 6.5 - 8.1 g/dL   Albumin 2.0 (L) 3.5 - 5.0 g/dL   AST 30 15 - 41 U/L   ALT 42 17 - 63 U/L   Alkaline Phosphatase 57 38 - 126 U/L   Total Bilirubin 0.4 0.3 - 1.2 mg/dL   GFR calc non Af Amer >60 >60 mL/min   GFR calc Af Amer >60 >60 mL/min    Comment: (NOTE) The eGFR has been calculated using the CKD EPI equation. This calculation has not been validated in all clinical situations. eGFR's persistently <60 mL/min signify possible Chronic Kidney Disease.    Anion gap 12 5 - 15  Protime-INR     Status: Abnormal   Collection Time: 02/21/15 11:13 PM  Result Value Ref Range   Prothrombin Time 33.8 (H) 11.6 - 15.2 seconds   INR 3.42 (H) 0.00 - 1.49  I-Stat CG4 Lactic Acid, ED     Status: None   Collection Time: 02/21/15 11:40 PM  Result Value Ref Range   Lactic Acid, Venous 1.08 0.5 - 2.0 mmol/L  Urinalysis, Routine w reflex microscopic (not at [facility])     Status: Abnormal   Collection Time: 02/22/15 12:36 AM  Result Value Ref Range   Color, Urine YELLOW YELLOW   APPearance CLEAR CLEAR   Specific Gravity, Urine 1.025 1.005 - 1.030   pH 5.0 5.0 - 8.0   Glucose, UA NEGATIVE NEGATIVE mg/dL   Hgb urine dipstick NEGATIVE NEGATIVE   Bilirubin Urine NEGATIVE NEGATIVE   Ketones, ur NEGATIVE NEGATIVE mg/dL   Protein,  ur 30 (A) NEGATIVE mg/dL   Nitrite NEGATIVE NEGATIVE   Leukocytes, UA NEGATIVE NEGATIVE  Urine microscopic-add on     Status: Abnormal   Collection Time: 02/22/15 12:36 AM  Result Value Ref Range  Squamous Epithelial / LPF 0-5 (A) NONE SEEN   WBC, UA 0-5 0 - 5 WBC/hpf   RBC / HPF 0-5 0 - 5 RBC/hpf   Bacteria, UA RARE (A) NONE SEEN   Casts HYALINE CASTS (A) NEGATIVE   Ct Abdomen Pelvis W Contrast  02/22/2015  CLINICAL DATA:  Ischemic colitis, status post bowel resection with colostomy. History of multiple intra-abdominal abscesses now with purulent drainage at drain site, LEFT lower quadrant pain. EXAM: CT ABDOMEN AND PELVIS WITH CONTRAST TECHNIQUE: Multidetector CT imaging of the abdomen and pelvis was performed using the standard protocol following bolus administration of intravenous contrast. CONTRAST:  125m OMNIPAQUE IOHEXOL 300 MG/ML  SOLN COMPARISON:  CT abdomen and pelvis February 12, 2015 FINDINGS: LUNG BASES: Moderate LEFT pleural effusion, similar to prior examination. Bibasilar consolidation with air bronchograms. Included heart size is normal. SOLID ORGANS: The liver demonstrates 1 cm probable cyst segment 7, otherwise unremarkable. Spleen, pancreas and gallbladder are normal. Thickened adrenal glands can be seen with hyperplasia without nodule. Spleen, gallbladder, pancreas and adrenal glands are unremarkable. GASTROINTESTINAL TRACT: Status post diverting RIGHT lower quadrant colostomy with contrast in the bag. Fluid-filled Hartmann's pouch. Focal small bowel wall thickening in LEFT upper quadrant which is unchanged, apposed to the intraperitoneal abscess. Normal appendix. KIDNEYS/ URINARY TRACT: Kidneys are orthotopic, demonstrating symmetric enhancement. No nephrolithiasis, hydronephrosis or solid renal masses. The unopacified ureters are normal in course and caliber. Delayed imaging through the kidneys demonstrates symmetric prompt contrast excretion within the proximal urinary collecting  system. Urinary bladder is well distended and unremarkable. PERITONEUM/RETROPERITONEUM: Contiguous with the colonic stump is a fluid collection with increasing fluid fluid level, LEFT lower quadrant pigtail drainage catheter along the lateral margin of the fluid collection. Large issue shaped component is 8.7 x 4 cm, previously 10.3 x 3.5 cm. There multiple smaller fluid collections the LEFT upper quadrant, contiguous with the greater curvature of the stomach and, the subdiaphragmatic recesses, which measures 6.8 x 4.3 cm, previously 8 x 4.4 cm. 5.1 x 2.9 cm fluid collection contiguous with the lateral margin LEFT psoas muscle was 5.2 x 2.4 cm. Aortoiliac vessels are normal in course and caliber, trace calcific atherosclerosis. No lymphadenopathy by CT size criteria. Prostate size is normal. SOFT TISSUE/OSSEOUS STRUCTURES: Non-suspicious. Small fat containing inguinal hernias. IMPRESSION: Similar size of multi loculated LEFT pelvic and abdominal abscesses with stable position of pigtail drainage catheter. Though the collections are similar in size, there is a larger fluid component, and decreased air component. LEFT upper abdominal abscess do not appear to communicate. 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 Stable small bowel wall thickening LEFT upper quadrant abutting the abscess. No bowel obstruction. Persistent partially imaged LEFT pleural effusion and bibasilar atelectasis or possible pneumonia. Electronically Signed   By: CElon AlasM.D.   On: 02/22/2015 03:59    Review of Systems  Constitutional: Positive for fever.  Respiratory: Negative for shortness of breath.   Cardiovascular: Negative for chest pain.  Gastrointestinal: Positive for abdominal pain.    Blood pressure 104/62, pulse 139, temperature 99.7 F (37.6 C), temperature source Oral, resp. rate 30, SpO2 91 %. Physical Exam  Constitutional: He is oriented to person, place, and  time. He appears well-developed and well-nourished. No distress.  HENT:  Head: Normocephalic and atraumatic.  Right Ear: External ear normal.  Left Ear: External ear normal.  Nose: Nose normal.  Mouth/Throat: Oropharynx is clear and moist.  Eyes: Pupils are equal, round, and reactive  to light. No scleral icterus.  Neck: Normal range of motion. No tracheal deviation present.  Cardiovascular: Normal heart sounds.   A. fib  Respiratory: Effort normal and breath sounds normal. No respiratory distress. He has no wheezes.  GI: Soft. He exhibits no distension.  There is very mild left lower quadrant tenderness. There is purulence and air in the drain. He has a wound VAC in place. His ostomy is productive. There is no peritonitis.  Musculoskeletal: Normal range of motion. He exhibits no tenderness.  Neurological: He is alert and oriented to person, place, and time.  Skin: Skin is warm. He is diaphoretic. No erythema.     Assessment/Plan Intra-abdominal abscess postoperatively  Plan will be to admit the patient to the hospital for IV antibiotics. Unfortunately, his blood was too thin for any interventional procedure until his INR comes back to normal. I believe he will need reevaluation by interventional radiology to see if a second drain can be placed. Currently, I am uncertain if the patient will actually agree to this. He'll be placed in the stepdown unit.  Harl Bowie, MD 02/22/2015, 5:44 AM

## 2015-02-22 NOTE — Progress Notes (Signed)
ANTIBIOTIC CONSULT NOTE  Pharmacy Consult for Vancomycin and Zosyn  Indication: cellulitis  Allergies  Allergen Reactions  . Contrast Media [Iodinated Diagnostic Agents] Rash    a diffuse macular rash after CTA chest  Pt was premedicated with 125mg  IV Solumedrol, 50mg  IV Benadryl 1 hr prior to CTexam, and tolerated procedure without any difficulties. 11/28/11 Also same pre med protocol observed on 02/22/15 and pt tolerated the procedure well    Patient Measurements:   Adjusted Body Weight: 90 kg  Vital Signs: Temp: 99.7 F (37.6 C) (02/04 2155) Temp Source: Oral (02/04 2155) BP: 102/62 mmHg (02/05 0545) Pulse Rate: 117 (02/05 0545) Intake/Output from previous day: 02/04 0701 - 02/05 0700 In: -  Out: 850 [Stool:850] Intake/Output from this shift:    Labs:  Recent Labs  02/21/15 2313  WBC 9.9  HGB 11.3*  PLT 451*  CREATININE 0.85   Estimated Creatinine Clearance: 140.1 mL/min (by C-G formula based on Cr of 0.85). No results for input(s): VANCOTROUGH, VANCOPEAK, VANCORANDOM, GENTTROUGH, GENTPEAK, GENTRANDOM, TOBRATROUGH, TOBRAPEAK, TOBRARND, AMIKACINPEAK, AMIKACINTROU, AMIKACIN in the last 72 hours.   Microbiology: Recent Results (from the past 720 hour(s))  Surgical pcr screen     Status: None   Collection Time: 01/28/15  3:52 PM  Result Value Ref Range Status   MRSA, PCR NEGATIVE NEGATIVE Final   Staphylococcus aureus NEGATIVE NEGATIVE Final    Comment:        The Xpert SA Assay (FDA approved for NASAL specimens in patients over 30 years of age), is one component of a comprehensive surveillance program.  Test performance has been validated by [facility] for patients greater than or equal to 36 year old. It is not intended to diagnose infection nor to guide or monitor treatment.   Culture, blood (routine x 2)     Status: None   Collection Time: 01/28/15  8:12 PM  Result Value Ref Range Status   Specimen Description BLOOD RIGHT HAND  Final   Special  Requests IN PEDIATRIC BOTTLE 4.5CC  Final   Culture NO GROWTH 5 DAYS  Final   Report Status 02/02/2015 FINAL  Final  Culture, blood (routine x 2)     Status: None   Collection Time: 01/28/15  8:24 PM  Result Value Ref Range Status   Specimen Description BLOOD LEFT HAND  Final   Special Requests IN PEDIATRIC BOTTLE [place]  Final   Culture NO GROWTH 5 DAYS  Final   Report Status 02/02/2015 FINAL  Final  Culture, routine-abscess     Status: None   Collection Time: 01/29/15  3:25 PM  Result Value Ref Range Status   Specimen Description ABSCESS ABDOMEN  Final   Special Requests NONE  Final   Gram Stain   Final    ABUNDANT WBC PRESENT,BOTH PMN AND MONONUCLEAR NO SQUAMOUS EPITHELIAL CELLS SEEN ABUNDANT GRAM POSITIVE COCCI IN PAIRS IN CHAINS IN CLUSTERS ABUNDANT GRAM NEGATIVE RODS FEW GRAM POSITIVE RODS    Culture   Final    MODERATE ESCHERICHIA COLI Performed at [facility]    Report Status 02/01/2015 FINAL  Final   Organism ID, Bacteria ESCHERICHIA COLI  Final      Susceptibility   Escherichia coli - MIC*    AMPICILLIN >=32 RESISTANT Resistant     AMPICILLIN/SULBACTAM >=32 RESISTANT Resistant     CEFEPIME <=1 SENSITIVE Sensitive     CEFTAZIDIME <=1 SENSITIVE Sensitive     CEFTRIAXONE <=1 SENSITIVE Sensitive     CIPROFLOXACIN <=0.25 SENSITIVE Sensitive  GENTAMICIN <=1 SENSITIVE Sensitive     IMIPENEM <=0.25 SENSITIVE Sensitive     PIP/TAZO <=4 SENSITIVE Sensitive     TOBRAMYCIN <=1 SENSITIVE Sensitive     TRIMETH/SULFA Value in next row Sensitive      <=20 SENSITIVE(NOTE)    * MODERATE ESCHERICHIA COLI  Urine culture     Status: None   Collection Time: 02/14/15  9:39 AM  Result Value Ref Range Status   Specimen Description URINE, CLEAN CATCH  Final   Special Requests NONE  Final   Culture 1,000 COLONIES/mL INSIGNIFICANT GROWTH  Final   Report Status 02/15/2015 FINAL  Final    Medical History: Past Medical History  Diagnosis Date  . Chicken pox as a child  .  Migraine 06/29/2011  . Obesity 06/29/2011  . Depression with anxiety 06/29/2011  . Atrial fibrillation ([place])     admx 11/13 with acute sCHF in setting of RVR  => a. failed DCCV x 2; b. Pradaxa started;  c. failed sotalol  . Chronic systolic heart failure ([place])     a. echo 11/13: Ef 40-45%, diff HK, mod MR, mod LAE, mild RVE, mod RAE, small effusion;   b. TEE 11/13:  EF 35-40%, no LAA clot; Echo 2/14 shows normal EF  . Cardiomyopathy ([place])     likely tachy mediated in setting of AF with RVR  . Snoring     patient needs sleep study - has declined  . Allergy to IVP dye   . Chronic anticoagulation     Pradaxa    Medications:  Cipro  Digoxin  Cardizem  Lopressor  APAP  Omnicef  Lasix  Zestril   Coumadin 5 mg daily  Assessment: 50 y.o. male with post-op wound infection/abcess for empiric antibiotics  Vancomycin 2 g IV given in ED at  0015  Goal of Therapy:  Vancomycin trough level 10-15 mcg/ml  Plan:  Vancomycin 1 g IV q8h Zosyn 3.375 g IV q8h   Caryl Pina 02/22/2015,7:02 AM

## 2015-02-22 NOTE — ED Notes (Signed)
CT called stated pt was on way back and was very upset b/c colostomy needs to be changed.

## 2015-02-23 DIAGNOSIS — Z7901 Long term (current) use of anticoagulants: Secondary | ICD-10-CM

## 2015-02-23 DIAGNOSIS — I482 Chronic atrial fibrillation: Secondary | ICD-10-CM

## 2015-02-23 DIAGNOSIS — K651 Peritoneal abscess: Secondary | ICD-10-CM

## 2015-02-23 LAB — CBC
HEMATOCRIT: 34.2 % — AB (ref 39.0–52.0)
HEMOGLOBIN: 11.1 g/dL — AB (ref 13.0–17.0)
MCH: 31.6 pg (ref 26.0–34.0)
MCHC: 32.5 g/dL (ref 30.0–36.0)
MCV: 97.4 fL (ref 78.0–100.0)
Platelets: 497 10*3/uL — ABNORMAL HIGH (ref 150–400)
RBC: 3.51 MIL/uL — ABNORMAL LOW (ref 4.22–5.81)
RDW: 17 % — AB (ref 11.5–15.5)
WBC: 7.1 10*3/uL (ref 4.0–10.5)

## 2015-02-23 LAB — BASIC METABOLIC PANEL
ANION GAP: 11 (ref 5–15)
BUN: 9 mg/dL (ref 6–20)
CALCIUM: 8.8 mg/dL — AB (ref 8.9–10.3)
CHLORIDE: 101 mmol/L (ref 101–111)
CO2: 27 mmol/L (ref 22–32)
Creatinine, Ser: 0.76 mg/dL (ref 0.61–1.24)
GFR calc Af Amer: >60 mL/min (ref >60)
GFR calc non Af Amer: >60 mL/min (ref >60)
GLUCOSE: 155 mg/dL — AB (ref 65–99)
Potassium: 4.2 mmol/L (ref 3.5–5.1)
Sodium: 139 mmol/L (ref 135–145)

## 2015-02-23 LAB — URINE CULTURE

## 2015-02-23 LAB — PROTIME-INR
INR: 3.41 — ABNORMAL HIGH (ref 0.00–1.49)
Prothrombin Time: 33.7 seconds — ABNORMAL HIGH (ref 11.6–15.2)

## 2015-02-23 MED ORDER — VANCOMYCIN HCL 10 G IV SOLR
2000.0000 mg | Freq: Once | INTRAVENOUS | Status: AC
  Administered 2015-02-23: 2000 mg via INTRAVENOUS
  Filled 2015-02-23: qty 2000

## 2015-02-23 MED ORDER — INFLUENZA VAC SPLIT QUAD 0.5 ML IM SUSY
0.5000 mL | PREFILLED_SYRINGE | INTRAMUSCULAR | Status: AC
  Administered 2015-02-24: 0.5 mL via INTRAMUSCULAR
  Filled 2015-02-23: qty 0.5

## 2015-02-23 MED ORDER — VANCOMYCIN HCL 10 G IV SOLR
1250.0000 mg | Freq: Three times a day (TID) | INTRAVENOUS | Status: DC
  Administered 2015-02-23 – 2015-02-24 (×2): 1250 mg via INTRAVENOUS
  Filled 2015-02-23 (×4): qty 1250

## 2015-02-23 MED ORDER — DILTIAZEM HCL ER COATED BEADS 120 MG PO CP24
120.0000 mg | ORAL_CAPSULE | Freq: Two times a day (BID) | ORAL | Status: DC
  Administered 2015-02-23 – 2015-02-24 (×3): 120 mg via ORAL
  Filled 2015-02-23 (×3): qty 1

## 2015-02-23 MED ORDER — WARFARIN - PHARMACIST DOSING INPATIENT: Freq: Every day | Status: DC

## 2015-02-23 NOTE — Progress Notes (Signed)
Patient ID: William Fischer, male   DOB: Apr 18, 1965, 50 y.o.   MRN: [id number]     [address] [place], [place] [address]    Phone: [phone] FAX: [phone]     Subjective: 172m out of drain, foul smelling and purulent. Ostomy functioning.  No n/v.  Afebrile.  BP soft  Objective:  Vital signs:  Filed Vitals:   02/23/15 0400 02/23/15 0412 02/23/15 0708 02/23/15 0800  BP: 107/70  97/64   Pulse: 70  91   Temp:  97.3 F (36.3 C)  97.8 F (36.6 C)  TempSrc:  Oral  Oral  Resp: 20  17   Weight:      SpO2: 96%  98%        Intake/Output   Yesterday:  02/05 0701 - 02/06 0700 In: 1960.4 [I.V.:1910.4; IV Piggyback:50] Out: 2900 [Urine:2400; Drains:100; Stool:400] This shift:  Total I/O In: 379.5 [I.V.:379.5] Out: 600 [Urine:350; Stool:250]  Physical Exam: General: Pt awake/alert/oriented x4 in no acute distress  Chest: cta No chest wall pain w good excursion CV:  S1S2 irregularly irregular rate and rhythm.  Abdomen: abdomen is soft, non tender.  rlq ostomy is functioning.  Midline wound with vac in place. llq with foul smelling purulent drainage.  Ext:  SCDs BLE.  No mjr edema.  No cyanosis Skin: No petechiae / purpura   Problem List:   Active Problems:   Abscess of abdominal cavity (HCC)    Results:   Labs: Results for orders placed or performed during the hospital encounter of 02/21/15 (from the past 48 hour(s))  CBC with Differential/Platelet     Status: Abnormal   Collection Time: 02/21/15 11:13 PM  Result Value Ref Range   WBC 9.9 4.0 - 10.5 K/uL   RBC 3.59 (L) 4.22 - 5.81 MIL/uL   Hemoglobin 11.3 (L) 13.0 - 17.0 g/dL   HCT 34.3 (L) 39.0 - 52.0 %   MCV 95.5 78.0 - 100.0 fL   MCH 31.5 26.0 - 34.0 pg   MCHC 32.9 30.0 - 36.0 g/dL   RDW 16.7 (H) 11.5 - 15.5 %   Platelets 451 (H) 150 - 400 K/uL   Neutrophils Relative % 53 %   Neutro Abs 5.3 1.7 - 7.7 K/uL   Lymphocytes Relative  26 %   Lymphs Abs 2.5 0.7 - 4.0 K/uL   Monocytes Relative 19 %   Monocytes Absolute 1.8 (H) 0.1 - 1.0 K/uL   Eosinophils Relative 2 %   Eosinophils Absolute 0.2 0.0 - 0.7 K/uL   Basophils Relative 0 %   Basophils Absolute 0.0 0.0 - 0.1 K/uL  Comprehensive metabolic panel     Status: Abnormal   Collection Time: 02/21/15 11:13 PM  Result Value Ref Range   Sodium 138 135 - 145 mmol/L   Potassium 4.2 3.5 - 5.1 mmol/L   Chloride 100 (L) 101 - 111 mmol/L   CO2 26 22 - 32 mmol/L   Glucose, Bld 115 (H) 65 - 99 mg/dL   BUN 12 6 - 20 mg/dL   Creatinine, Ser 0.85 0.61 - 1.24 mg/dL   Calcium 8.5 (L) 8.9 - 10.3 mg/dL   Total Protein 6.6 6.5 - 8.1 g/dL   Albumin 2.0 (L) 3.5 - 5.0 g/dL   AST 30 15 - 41 U/L   ALT 42 17 - 63 U/L   Alkaline Phosphatase 57 38 - 126 U/L   Total Bilirubin 0.4  0.3 - 1.2 mg/dL   GFR calc non Af Amer >60 >60 mL/min   GFR calc Af Amer >60 >60 mL/min    Comment: (NOTE) The eGFR has been calculated using the CKD EPI equation. This calculation has not been validated in all clinical situations. eGFR's persistently <60 mL/min signify possible Chronic Kidney Disease.    Anion gap 12 5 - 15  Blood culture (routine x 2)     Status: None (Preliminary result)   Collection Time: 02/21/15 11:13 PM  Result Value Ref Range   Specimen Description BLOOD LEFT HAND    Special Requests BOTTLES DRAWN AEROBIC AND ANAEROBIC 5CC     Culture  Setup Time      AEROBIC BOTTLE ONLY GRAM POSITIVE COCCI IN CLUSTERS CRITICAL RESULT CALLED TO, READ BACK BY AND VERIFIED WITH: C.HAGERMAN,RN 2038 02/22/15 M.CAMPBELL    Culture CULTURE REINCUBATED FOR BETTER GROWTH    Report Status PENDING   Protime-INR     Status: Abnormal   Collection Time: 02/21/15 11:13 PM  Result Value Ref Range   Prothrombin Time 33.8 (H) 11.6 - 15.2 seconds   INR 3.42 (H) 0.00 - 1.49  I-Stat CG4 Lactic Acid, ED     Status: None   Collection Time: 02/21/15 11:40 PM  Result Value Ref Range   Lactic Acid, Venous 1.08  0.5 - 2.0 mmol/L  Urinalysis, Routine w reflex microscopic (not at [facility])     Status: Abnormal   Collection Time: 02/22/15 12:36 AM  Result Value Ref Range   Color, Urine YELLOW YELLOW   APPearance CLEAR CLEAR   Specific Gravity, Urine 1.025 1.005 - 1.030   pH 5.0 5.0 - 8.0   Glucose, UA NEGATIVE NEGATIVE mg/dL   Hgb urine dipstick NEGATIVE NEGATIVE   Bilirubin Urine NEGATIVE NEGATIVE   Ketones, ur NEGATIVE NEGATIVE mg/dL   Protein, ur 30 (A) NEGATIVE mg/dL   Nitrite NEGATIVE NEGATIVE   Leukocytes, UA NEGATIVE NEGATIVE  Urine microscopic-add on     Status: Abnormal   Collection Time: 02/22/15 12:36 AM  Result Value Ref Range   Squamous Epithelial / LPF 0-5 (A) NONE SEEN   WBC, UA 0-5 0 - 5 WBC/hpf   RBC / HPF 0-5 0 - 5 RBC/hpf   Bacteria, UA RARE (A) NONE SEEN   Casts HYALINE CASTS (A) NEGATIVE    Imaging / Studies: Ct Abdomen Pelvis W Contrast  02/22/2015  CLINICAL DATA:  Ischemic colitis, status post bowel resection with colostomy. History of multiple intra-abdominal abscesses now with purulent drainage at drain site, LEFT lower quadrant pain. EXAM: CT ABDOMEN AND PELVIS WITH CONTRAST TECHNIQUE: Multidetector CT imaging of the abdomen and pelvis was performed using the standard protocol following bolus administration of intravenous contrast. CONTRAST:  169m OMNIPAQUE IOHEXOL 300 MG/ML  SOLN COMPARISON:  CT abdomen and pelvis February 12, 2015 FINDINGS: LUNG BASES: Moderate LEFT pleural effusion, similar to prior examination. Bibasilar consolidation with air bronchograms. Included heart size is normal. SOLID ORGANS: The liver demonstrates 1 cm probable cyst segment 7, otherwise unremarkable. Spleen, pancreas and gallbladder are normal. Thickened adrenal glands can be seen with hyperplasia without nodule. Spleen, gallbladder, pancreas and adrenal glands are unremarkable. GASTROINTESTINAL TRACT: Status post diverting RIGHT lower quadrant colostomy with contrast in the bag. Fluid-filled  Hartmann's pouch. Focal small bowel wall thickening in LEFT upper quadrant which is unchanged, apposed to the intraperitoneal abscess. Normal appendix. KIDNEYS/ URINARY TRACT: Kidneys are orthotopic, demonstrating symmetric enhancement. No nephrolithiasis, hydronephrosis or solid renal masses. The unopacified  ureters are normal in course and caliber. Delayed imaging through the kidneys demonstrates symmetric prompt contrast excretion within the proximal urinary collecting system. Urinary bladder is well distended and unremarkable. PERITONEUM/RETROPERITONEUM: Contiguous with the colonic stump is a fluid collection with increasing fluid fluid level, LEFT lower quadrant pigtail drainage catheter along the lateral margin of the fluid collection. Large issue shaped component is 8.7 x 4 cm, previously 10.3 x 3.5 cm. There multiple smaller fluid collections the LEFT upper quadrant, contiguous with the greater curvature of the stomach and, the subdiaphragmatic recesses, which measures 6.8 x 4.3 cm, previously 8 x 4.4 cm. 5.1 x 2.9 cm fluid collection contiguous with the lateral margin LEFT psoas muscle was 5.2 x 2.4 cm. Aortoiliac vessels are normal in course and caliber, trace calcific atherosclerosis. No lymphadenopathy by CT size criteria. Prostate size is normal. SOFT TISSUE/OSSEOUS STRUCTURES: Non-suspicious. Small fat containing inguinal hernias. IMPRESSION: Similar size of multi loculated LEFT pelvic and abdominal abscesses with stable position of pigtail drainage catheter. Though the collections are similar in size, there is a larger fluid component, and decreased air component. LEFT upper abdominal abscess do not appear to communicate. 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 Stable small bowel wall thickening LEFT upper quadrant abutting the abscess. No bowel obstruction. Persistent partially imaged LEFT pleural effusion and bibasilar atelectasis or possible  pneumonia. Electronically Signed   By: Elon Alas M.D.   On: 02/22/2015 03:59    Medications / Allergies:  Scheduled Meds: . digoxin  0.125 mg Oral Daily  . diltiazem  120 mg Oral BID  . feeding supplement  1 Container Oral TID BM  . lisinopril  5 mg Oral Daily  . metoprolol tartrate  150 mg Oral BID  . piperacillin-tazobactam (ZOSYN)  IV  3.375 g Intravenous Q8H   Continuous Infusions: . 0.9 % NaCl with KCl 20 mEq / L 100 mL/hr at 02/23/15 0914   PRN Meds:.acetaminophen **OR** acetaminophen, HYDROmorphone (DILAUDID) injection, ondansetron **OR** ondansetron (ZOFRAN) IV, oxyCODONE  Antibiotics: Anti-infectives    Start     Dose/Rate Route Frequency Ordered Stop   02/22/15 0800  vancomycin (VANCOCIN) IVPB 1000 mg/200 mL premix  Status:  Discontinued     1,000 mg 200 mL/hr over 60 Minutes Intravenous Every 8 hours 02/22/15 0709 02/22/15 0752   02/22/15 0800  piperacillin-tazobactam (ZOSYN) IVPB 3.375 g     3.375 g 12.5 mL/hr over 240 Minutes Intravenous Every 8 hours 02/22/15 0709     02/21/15 2330  piperacillin-tazobactam (ZOSYN) IVPB 3.375 g     3.375 g 100 mL/hr over 30 Minutes Intravenous  Once 02/21/15 2322 02/22/15 0049   02/21/15 2330  vancomycin (VANCOCIN) IVPB 1000 mg/200 mL premix  Status:  Discontinued     1,000 mg 200 mL/hr over 60 Minutes Intravenous  Once 02/21/15 2322 02/21/15 2324   02/21/15 2330  vancomycin (VANCOCIN) 2,000 mg in sodium chloride 0.9 % 500 mL IVPB     2,000 mg 250 mL/hr over 120 Minutes Intravenous  Once 02/21/15 2324 02/22/15 0035        Assessment/Plan Exploratory laparotomy, left colectomy, end colostomy for ischemic bowel---Dr. Donne Hazel 01/17/15 Rectal stump leak  Pelvic abscess  -Repeat INR is pending.  IR to evaluate for drain placement once INR is down -Ask cardiology to evaluate, not sure whether we can reverse him or allow to drift down given the above event was embolic  -Continue LLQ drain  -VAC change today, will  evaluate wound  CV-AFIB-resume cardizem.  Hold coumadin.  Apparently takes 9m of metoprolol, will change.  Cardiology consult  HTN-home meds, watch for hypotension.  Chronic systolic heart failure-reduce IVF VTE prophylaxis-SCD, lovenox when INR <2 ID-zosyn.  Previous cultures grew e coli.  GPC 1/2 BCs-->start Vanc FEN-advance diet Dispo-SDU today  EErby Pian [facility] Pager [phone] For consults and floor pages call [phone])  02/23/2015 9:47 AM

## 2015-02-23 NOTE — Consult Note (Signed)
Cardiologist:  Allred Reason for Consult: need to stop coumadin Referring Physician:    HPI:   William Fischer is an 50 y.o. male with a h/o chronic afib and recent hospitalization 12/28 thru 1/16 for diverticulitis of colon with perforation, intra- abdominal abscess and status post left colectomy with end colostomy for perforated diverticulitis with ischemia and distal stump breakdown/leak controlled with percutaneous drain.  He saw Roderic Palau, NP on Jan 30.  At that visit his HR was controlled at 78-96.  On cardizem 120 BID, metoprolol 150 BID, digoxin 0.125 daily.  He was on lovenox to coumadin bridging.   His last echo was 01/16/15 which revealed an EF of ~45-50%, mild MR, LA moderately dilated, small lateral pericardial effusion, "? lipomatoushypertrophy of atrial septum cannot r/o mass in RA but suspect this is off axis artifact."    He is here again with a rectal stump leak and pelvic abcess.  He needs another drain placed.  INR is 3.41.  We are asking to see regarding the coumadin.  The patient currently denies nausea, vomiting, fever, chest pain, shortness of breath, orthopnea, dizziness, PND, cough, congestion,  hematochezia, melena, lower extremity edema, claudication.   Past Medical History  Diagnosis Date  . Chicken pox as a child  . Migraine 06/29/2011  . Obesity 06/29/2011  . Depression with anxiety 06/29/2011  . Atrial fibrillation ([place])     admx 11/13 with acute sCHF in setting of RVR  => a. failed DCCV x 2; b. Pradaxa started;  c. failed sotalol  . Chronic systolic heart failure ([place])     a. echo 11/13: Ef 40-45%, diff HK, mod MR, mod LAE, mild RVE, mod RAE, small effusion;   b. TEE 11/13:  EF 35-40%, no LAA clot; Echo 2/14 shows normal EF  . 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 Snoring     patient needs sleep study - has declined  . Allergy to IVP dye   . Chronic anticoagulation     Pradaxa    Past Surgical History    Procedure Laterality Date  . Tee without cardioversion  11/30/2011    Procedure: TRANSESOPHAGEAL ECHOCARDIOGRAM (TEE);  Surgeon: Thayer Headings, MD;  Location: [place];  Service: Cardiovascular;  Laterality: N/A;  . Cardioversion  11/30/2011    Procedure: CARDIOVERSION;  Surgeon: Thayer Headings, MD;  Location: [place];  Service: Cardiovascular;  Laterality: N/A;  . Cardioversion  12/03/2011    Procedure: CARDIOVERSION;  Surgeon: Jolaine Artist, MD;  Location: [place];  Service: Cardiovascular;  Laterality: N/A;  . Laparotomy N/A 01/17/2015    Procedure: EXPLORATORY LAPAROTOMY WITH LEFT COLECTOMY AND COLOSTOMY;  Surgeon: Rolm Bookbinder, MD;  Location: [facility];  Service: General;  Laterality: N/A;    Family History  Problem Relation Age of Onset  . Hypertension Mother   . Hypertension Father   . Aneurysm Father   . Alcohol abuse Father   . Cancer Maternal Grandmother     brain  . Diabetes Maternal Grandfather     type 2  . Parkinsonism Maternal Grandfather   . Cancer Paternal Grandmother     breast  . Diabetes Paternal Grandmother   . Alcohol abuse Paternal Grandfather     Social History:  reports that he quit smoking about 7 months ago. His smoking use included Cigarettes. He has a 20 pack-year smoking history. He has never used smokeless tobacco. He reports that  he does not drink alcohol or use illicit drugs.  Allergies:  Allergies  Allergen Reactions  . Contrast Media [Iodinated Diagnostic Agents] Rash    a diffuse macular rash after CTA chest  Pt was premedicated with 187m IV Solumedrol, 524mIV Benadryl 1 hr prior to CTexam, and tolerated procedure without any difficulties. 11/28/11 Also same pre med protocol observed on 02/22/15 and pt tolerated the procedure well    Medications: Scheduled Meds: . digoxin  0.125 mg Oral Daily  . diltiazem  120 mg Oral BID  . feeding supplement  1 Container Oral TID BM  . lisinopril  5 mg Oral Daily  . metoprolol  tartrate  150 mg Oral BID  . piperacillin-tazobactam (ZOSYN)  IV  3.375 g Intravenous Q8H   Continuous Infusions: . 0.9 % NaCl with KCl 20 mEq / L 100 mL/hr at 02/23/15 0914   PRN Meds:.acetaminophen **OR** acetaminophen, HYDROmorphone (DILAUDID) injection, ondansetron **OR** ondansetron (ZOFRAN) IV, oxyCODONE   Results for orders placed or performed during the hospital encounter of 02/21/15 (from the past 48 hour(s))  CBC with Differential/Platelet     Status: Abnormal   Collection Time: 02/21/15 11:13 PM  Result Value Ref Range   WBC 9.9 4.0 - 10.5 K/uL   RBC 3.59 (L) 4.22 - 5.81 MIL/uL   Hemoglobin 11.3 (L) 13.0 - 17.0 g/dL   HCT 34.3 (L) 39.0 - 52.0 %   MCV 95.5 78.0 - 100.0 fL   MCH 31.5 26.0 - 34.0 pg   MCHC 32.9 30.0 - 36.0 g/dL   RDW 16.7 (H) 11.5 - 15.5 %   Platelets 451 (H) 150 - 400 K/uL   Neutrophils Relative % 53 %   Neutro Abs 5.3 1.7 - 7.7 K/uL   Lymphocytes Relative 26 %   Lymphs Abs 2.5 0.7 - 4.0 K/uL   Monocytes Relative 19 %   Monocytes Absolute 1.8 (H) 0.1 - 1.0 K/uL   Eosinophils Relative 2 %   Eosinophils Absolute 0.2 0.0 - 0.7 K/uL   Basophils Relative 0 %   Basophils Absolute 0.0 0.0 - 0.1 K/uL  Comprehensive metabolic panel     Status: Abnormal   Collection Time: 02/21/15 11:13 PM  Result Value Ref Range   Sodium 138 135 - 145 mmol/L   Potassium 4.2 3.5 - 5.1 mmol/L   Chloride 100 (L) 101 - 111 mmol/L   CO2 26 22 - 32 mmol/L   Glucose, Bld 115 (H) 65 - 99 mg/dL   BUN 12 6 - 20 mg/dL   Creatinine, Ser 0.85 0.61 - 1.24 mg/dL   Calcium 8.5 (L) 8.9 - 10.3 mg/dL   Total Protein 6.6 6.5 - 8.1 g/dL   Albumin 2.0 (L) 3.5 - 5.0 g/dL   AST 30 15 - 41 U/L   ALT 42 17 - 63 U/L   Alkaline Phosphatase 57 38 - 126 U/L   Total Bilirubin 0.4 0.3 - 1.2 mg/dL   GFR calc non Af Amer >60 >60 mL/min   GFR calc Af Amer >60 >60 mL/min    Comment: (NOTE) The eGFR has been calculated using the CKD EPI equation. This calculation has not been validated in all  clinical situations. eGFR's persistently <60 mL/min signify possible Chronic Kidney Disease.    Anion gap 12 5 - 15  Blood culture (routine x 2)     Status: None (Preliminary result)   Collection Time: 02/21/15 11:13 PM  Result Value Ref Range   Specimen Description BLOOD LEFT HAND  Special Requests BOTTLES DRAWN AEROBIC AND ANAEROBIC 5CC     Culture  Setup Time      AEROBIC BOTTLE ONLY GRAM POSITIVE COCCI IN CLUSTERS CRITICAL RESULT CALLED TO, READ BACK BY AND VERIFIED WITH: C.HAGERMAN,RN 2038 02/22/15 M.CAMPBELL    Culture CULTURE REINCUBATED FOR BETTER GROWTH    Report Status PENDING   Protime-INR     Status: Abnormal   Collection Time: 02/21/15 11:13 PM  Result Value Ref Range   Prothrombin Time 33.8 (H) 11.6 - 15.2 seconds   INR 3.42 (H) 0.00 - 1.49  I-Stat CG4 Lactic Acid, ED     Status: None   Collection Time: 02/21/15 11:40 PM  Result Value Ref Range   Lactic Acid, Venous 1.08 0.5 - 2.0 mmol/L  Urinalysis, Routine w reflex microscopic (not at [facility])     Status: Abnormal   Collection Time: 02/22/15 12:36 AM  Result Value Ref Range   Color, Urine YELLOW YELLOW   APPearance CLEAR CLEAR   Specific Gravity, Urine 1.025 1.005 - 1.030   pH 5.0 5.0 - 8.0   Glucose, UA NEGATIVE NEGATIVE mg/dL   Hgb urine dipstick NEGATIVE NEGATIVE   Bilirubin Urine NEGATIVE NEGATIVE   Ketones, ur NEGATIVE NEGATIVE mg/dL   Protein, ur 30 (A) NEGATIVE mg/dL   Nitrite NEGATIVE NEGATIVE   Leukocytes, UA NEGATIVE NEGATIVE  Urine microscopic-add on     Status: Abnormal   Collection Time: 02/22/15 12:36 AM  Result Value Ref Range   Squamous Epithelial / LPF 0-5 (A) NONE SEEN   WBC, UA 0-5 0 - 5 WBC/hpf   RBC / HPF 0-5 0 - 5 RBC/hpf   Bacteria, UA RARE (A) NONE SEEN   Casts HYALINE CASTS (A) NEGATIVE  Protime-INR     Status: Abnormal   Collection Time: 02/23/15  9:22 AM  Result Value Ref Range   Prothrombin Time 33.7 (H) 11.6 - 15.2 seconds   INR 3.41 (H) 0.00 - 1.49  CBC     Status:  Abnormal   Collection Time: 02/23/15  9:22 AM  Result Value Ref Range   WBC 7.1 4.0 - 10.5 K/uL   RBC 3.51 (L) 4.22 - 5.81 MIL/uL   Hemoglobin 11.1 (L) 13.0 - 17.0 g/dL   HCT 34.2 (L) 39.0 - 52.0 %   MCV 97.4 78.0 - 100.0 fL   MCH 31.6 26.0 - 34.0 pg   MCHC 32.5 30.0 - 36.0 g/dL   RDW 17.0 (H) 11.5 - 15.5 %   Platelets 497 (H) 150 - 400 K/uL  Basic metabolic panel     Status: Abnormal   Collection Time: 02/23/15  9:22 AM  Result Value Ref Range   Sodium 139 135 - 145 mmol/L   Potassium 4.2 3.5 - 5.1 mmol/L   Chloride 101 101 - 111 mmol/L   CO2 27 22 - 32 mmol/L   Glucose, Bld 155 (H) 65 - 99 mg/dL   BUN 9 6 - 20 mg/dL   Creatinine, Ser 0.76 0.61 - 1.24 mg/dL   Calcium 8.8 (L) 8.9 - 10.3 mg/dL   GFR calc non Af Amer >60 >60 mL/min   GFR calc Af Amer >60 >60 mL/min    Comment: (NOTE) The eGFR has been calculated using the CKD EPI equation. This calculation has not been validated in all clinical situations. eGFR's persistently <60 mL/min signify possible Chronic Kidney Disease.    Anion gap 11 5 - 15    Ct Abdomen Pelvis W Contrast  02/22/2015  CLINICAL  DATA:  Ischemic colitis, status post bowel resection with colostomy. History of multiple intra-abdominal abscesses now with purulent drainage at drain site, LEFT lower quadrant pain. EXAM: CT ABDOMEN AND PELVIS WITH CONTRAST TECHNIQUE: Multidetector CT imaging of the abdomen and pelvis was performed using the standard protocol following bolus administration of intravenous contrast. CONTRAST:  131m OMNIPAQUE IOHEXOL 300 MG/ML  SOLN COMPARISON:  CT abdomen and pelvis February 12, 2015 FINDINGS: LUNG BASES: Moderate LEFT pleural effusion, similar to prior examination. Bibasilar consolidation with air bronchograms. Included heart size is normal. SOLID ORGANS: The liver demonstrates 1 cm probable cyst segment 7, otherwise unremarkable. Spleen, pancreas and gallbladder are normal. Thickened adrenal glands can be seen with hyperplasia without  nodule. Spleen, gallbladder, pancreas and adrenal glands are unremarkable. GASTROINTESTINAL TRACT: Status post diverting RIGHT lower quadrant colostomy with contrast in the bag. Fluid-filled Hartmann's pouch. Focal small bowel wall thickening in LEFT upper quadrant which is unchanged, apposed to the intraperitoneal abscess. Normal appendix. KIDNEYS/ URINARY TRACT: Kidneys are orthotopic, demonstrating symmetric enhancement. No nephrolithiasis, hydronephrosis or solid renal masses. The unopacified ureters are normal in course and caliber. Delayed imaging through the kidneys demonstrates symmetric prompt contrast excretion within the proximal urinary collecting system. Urinary bladder is well distended and unremarkable. PERITONEUM/RETROPERITONEUM: Contiguous with the colonic stump is a fluid collection with increasing fluid fluid level, LEFT lower quadrant pigtail drainage catheter along the lateral margin of the fluid collection. Large issue shaped component is 8.7 x 4 cm, previously 10.3 x 3.5 cm. There multiple smaller fluid collections the LEFT upper quadrant, contiguous with the greater curvature of the stomach and, the subdiaphragmatic recesses, which measures 6.8 x 4.3 cm, previously 8 x 4.4 cm. 5.1 x 2.9 cm fluid collection contiguous with the lateral margin LEFT psoas muscle was 5.2 x 2.4 cm. Aortoiliac vessels are normal in course and caliber, trace calcific atherosclerosis. No lymphadenopathy by CT size criteria. Prostate size is normal. SOFT TISSUE/OSSEOUS STRUCTURES: Non-suspicious. Small fat containing inguinal hernias. IMPRESSION: Similar size of multi loculated LEFT pelvic and abdominal abscesses with stable position of pigtail drainage catheter. Though the collections are similar in size, there is a larger fluid component, and decreased air component. LEFT upper abdominal abscess do not appear to communicate. 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  Stable small bowel wall thickening LEFT upper quadrant abutting the abscess. No bowel obstruction. Persistent partially imaged LEFT pleural effusion and bibasilar atelectasis or possible pneumonia. Electronically Signed   By: CElon AlasM.D.   On: 02/22/2015 03:59    Review of Systems  Constitutional: Negative for fever, chills and diaphoresis.  HENT: Negative for congestion and sore throat.   Respiratory: Negative for cough, shortness of breath and wheezing.   Cardiovascular: Negative for chest pain, palpitations, orthopnea, leg swelling and PND.  Gastrointestinal: Negative for nausea, vomiting, abdominal pain, blood in stool and melena.  Genitourinary: Negative for hematuria.  Musculoskeletal: Negative for myalgias.  Neurological: Negative for dizziness and weakness.  All other systems reviewed and are negative.  Blood pressure 97/64, pulse 91, temperature 97.8 F (36.6 C), temperature source Oral, resp. rate 17, weight 267 lb (121.11 kg), SpO2 98 %. Physical Exam  Nursing note and vitals reviewed. Constitutional: He is oriented to person, place, and time. He appears well-developed. No distress.  Obese Midline abd wound vac present.  LLQ perc drain  HENT:  Head: Normocephalic and atraumatic.  Eyes: EOM are normal. Pupils are equal, round, and reactive to light. No  scleral icterus.  Neck: Normal range of motion. Neck supple.  Cardiovascular: Normal rate, S1 normal and S2 normal.  An irregularly irregular rhythm present.  No murmur heard. Pulses:      Radial pulses are 2+ on the right side, and 2+ on the left side.       Dorsalis pedis pulses are 2+ on the right side, and 2+ on the left side.  Respiratory: Effort normal and breath sounds normal. He has no wheezes. He has no rales.  GI: Bowel sounds are normal.  Musculoskeletal: He exhibits no edema (No LEE).  Lymphadenopathy:    He has no cervical adenopathy.  Neurological: He is alert and oriented to person, place, and  time. He exhibits normal muscle tone.  Skin: Skin is warm and dry.  Psychiatric: He has a normal mood and affect.    Assessment/Plan: Principal Problem:   Abscess of abdominal cavity (HCC) Active Problems:   Obesity   Atrial fibrillation (HCC)   Chronic systolic heart failure ([place])   Long term (current) use of anticoagulants [Z79.01]  Mr. William Fischer HR initially was quite rapid however, it has improved and is controlled now.  He is on cardizem 120 bid, digoxin 0.125 daily and metoprolol 75 bid   INR is 3.41.  Ok to reverse coumadin if needed, as long as he is bridged with heparin.  Blood cultures pending.  Mildly hypotensive.  He appears euvolemic-laying flat in bed with no pitting LEE.   Tarri Fuller, [place] 02/23/2015, 10:58 AM   Patient seen and examined and history reviewed. Agree with above findings and plan. 50 yo WM with chronic Afib. S/p extensive abdominal surgery last month for colonic rupture from diverticular disease with abscess. Now readmitted with pelvic abscess. He is therapeutic on coumadin. Afib rate is well controlled. BP is stable. Lungs are clear. Wound Vac in place on abdomen with colostomy.  I would recommend reversing coumadin with IV FFP so you can proceed with surgery. Post op would resume IV heparin or Lovenox with plan to bridge until coumadin is therapeutic again.  Peter Martinique, [place] 02/23/2015 11:31 AM

## 2015-02-23 NOTE — Progress Notes (Signed)
ANTICOAGULATION CONSULT NOTE - Initial Consult  Pharmacy Consult for Warfarin Indication: atrial fibrillation  Allergies  Allergen Reactions  . Contrast Media [Iodinated Diagnostic Agents] Rash    a diffuse macular rash after CTA chest  Pt was premedicated with 125mg  IV Solumedrol, 50mg  IV Benadryl 1 hr prior to CTexam, and tolerated procedure without any difficulties. 11/28/11 Also same pre med protocol observed on 02/22/15 and pt tolerated the procedure well    Patient Measurements: Weight: 267 lb (121.11 kg) Heparin Dosing Weight:   Vital Signs: Temp: 97.6 F (36.4 C) (02/06 1300) Temp Source: Oral (02/06 1300) BP: 97/64 mmHg (02/06 0708) Pulse Rate: 91 (02/06 0708)  Labs:  Recent Labs  02/21/15 2313 02/23/15 0922  HGB 11.3* 11.1*  HCT 34.3* 34.2*  PLT 451* 497*  LABPROT 33.8* 33.7*  INR 3.42* 3.41*  CREATININE 0.85 0.76    Estimated Creatinine Clearance: 148.9 mL/min (by C-G formula based on Cr of 0.76).   Medical History: Past Medical History  Diagnosis Date  . Chicken pox as a child  . Migraine 06/29/2011  . Obesity 06/29/2011  . Depression with anxiety 06/29/2011  . Atrial fibrillation ([place])     admx 11/13 with acute sCHF in setting of RVR  => a. failed DCCV x 2; b. Pradaxa started;  c. failed sotalol  . Chronic systolic heart failure ([place])     a. echo 11/13: Ef 40-45%, diff HK, mod MR, mod LAE, mild RVE, mod RAE, small effusion;   b. TEE 11/13:  EF 35-40%, no LAA clot; Echo 2/14 shows normal EF  . Cardiomyopathy ([place])     likely tachy mediated in setting of AF with RVR  . Snoring     patient needs sleep study - has declined  . Allergy to IVP dye   . Chronic anticoagulation     Pradaxa    Medications:  Prescriptions prior to admission  Medication Sig Dispense Refill Last Dose  . acetaminophen (TYLENOL) 500 MG tablet Take 500-1,000 mg by mouth every 6 (six) hours as needed for mild pain or headache.   02/20/2015 at Unknown time  . cefdinir (OMNICEF) 300  MG capsule Take 300 mg by mouth 2 (two) times daily.   0 02/21/2015 at Unknown time  . ciprofloxacin (CIPRO) 500 MG tablet Take 1 tablet (500 mg total) by mouth 2 (two) times daily. 28 tablet 0 02/21/2015 at Unknown time  . digoxin (LANOXIN) 0.125 MG tablet Take 1 tablet (0.125 mg total) by mouth daily. 30 tablet 0 02/21/2015 at Unknown time  . diltiazem (CARDIZEM CD) 120 MG 24 hr capsule Take 1 capsule (120 mg total) by mouth 2 (two) times daily. 30 capsule 0 02/21/2015 at Unknown time  . feeding supplement (BOOST / RESOURCE BREEZE) LIQD Take 1 Container by mouth 3 (three) times daily between meals. 12 Container 0 02/21/2015 at Unknown time  . furosemide (LASIX) 40 MG tablet Take 40 mg by mouth daily.   02/21/2015 at Unknown time  . lisinopril (PRINIVIL,ZESTRIL) 5 MG tablet Take 5 mg by mouth daily.   3 02/21/2015 at Unknown time  . Metoprolol Tartrate 75 MG TABS Take 150 mg by mouth 2 (two) times daily. 60 tablet 0 02/21/2015 at 1800  . warfarin (COUMADIN) 5 MG tablet Take 1 tablet (5 mg total) by mouth daily. Take as directed by the Coumadin clinic. 30 tablet 0 02/21/2015 at Unknown time   Scheduled:  . digoxin  0.125 mg Oral Daily  . diltiazem  120 mg Oral  BID  . feeding supplement  1 Container Oral TID BM  . lisinopril  5 mg Oral Daily  . metoprolol tartrate  150 mg Oral BID  . piperacillin-tazobactam (ZOSYN)  IV  3.375 g Intravenous Q8H  . vancomycin  2,000 mg Intravenous Once   Followed by  . vancomycin  1,250 mg Intravenous Q8H   Infusions:  . 0.9 % NaCl with KCl 20 mEq / L 50 mL/hr at 02/23/15 [id number]    Assessment: 50yo male with chronic Afib s/p abdominal surgery last month for colonic rupture. Pharmacy is consulted to dose warfarin for atrial fibrillation.  Goal of Therapy:  INR 2-3 Monitor platelets by anticoagulation protocol: Yes   Plan:  Hold warfarin tonight x1 Daily INR/CBC Monitor s/sx of bleeding  Andrey Cota. Diona Foley, PharmD, [place] Clinical Pharmacist Pager [phone] 02/23/2015,1:46  PM

## 2015-02-23 NOTE — Progress Notes (Addendum)
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 Will continue to monitor.

## 2015-02-23 NOTE — Progress Notes (Signed)
Referring Physician(s): CCS  Chief Complaint:  Left abd abscess drain placed 2/2  Subjective:  Drain intact 100 cc from drain yesterday afeb CT:  IMPRESSION: Similar size of multi loculated LEFT pelvic and abdominal abscesses with stable position of pigtail drainage catheter. Though the collections are similar in size, there is a larger fluid component, and decreased air component. LEFT upper abdominal abscess do not appear to communicate.  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  Request for new drain placement per Dr Donne Hazel Dr Earleen Newport has reviewed imaging  No new drain required Existing drain is adequate and properly placed Small abscesses in abd are too small and do not communicate to L abdomen abscess  Allergies: Contrast media  Medications: Prior to Admission medications   Medication Sig Start Date End Date Taking? Authorizing Provider  acetaminophen (TYLENOL) 500 MG tablet Take 500-1,000 mg by mouth every 6 (six) hours as needed for mild pain or headache.   Yes Historical Provider, MD  cefdinir (OMNICEF) 300 MG capsule Take 300 mg by mouth 2 (two) times daily.  02/02/15  Yes Historical Provider, MD  ciprofloxacin (CIPRO) 500 MG tablet Take 1 tablet (500 mg total) by mouth 2 (two) times daily. 02/14/15  Yes Carlisle Cater, PA-C  digoxin (LANOXIN) 0.125 MG tablet Take 1 tablet (0.125 mg total) by mouth daily. 02/02/15  Yes Liberty Handy, MD  diltiazem (CARDIZEM CD) 120 MG 24 hr capsule Take 1 capsule (120 mg total) by mouth 2 (two) times daily. 02/09/15  Yes Sherran Needs, NP  feeding supplement (BOOST / RESOURCE BREEZE) LIQD Take 1 Container by mouth 3 (three) times daily between meals. 02/02/15  Yes Liberty Handy, MD  furosemide (LASIX) 40 MG tablet Take 40 mg by mouth daily.   Yes Historical Provider, MD  lisinopril (PRINIVIL,ZESTRIL) 5 MG tablet Take 5 mg by mouth daily.  01/01/15  Yes Historical Provider, MD    Metoprolol Tartrate 75 MG TABS Take 150 mg by mouth 2 (two) times daily. 02/02/15  Yes Liberty Handy, MD  warfarin (COUMADIN) 5 MG tablet Take 1 tablet (5 mg total) by mouth daily. Take as directed by the Coumadin clinic. 02/11/15  Yes Thompson Grayer, MD     Vital Signs: BP 97/64 mmHg  Pulse 91  Temp(Src) 97.8 F (36.6 C) (Oral)  Resp 17  Wt 267 lb (121.11 kg)  SpO2 98%  Physical Exam  Constitutional: He is oriented to person, place, and time.  Abdominal: Soft. He exhibits distension. There is tenderness.  Musculoskeletal: Normal range of motion.  Neurological: He is alert and oriented to person, place, and time.  Skin: Skin is warm and dry.  Site of L abd drain is tender Noted some purulent output from drain site No sign of infection No redness 100 cc output 2/5 150 cc in bag now Purulent and odorous Wbc wnl afeb   Psychiatric: He has a normal mood and affect. His behavior is normal.    Imaging: Ct Abdomen Pelvis W Contrast  02/22/2015  CLINICAL DATA:  Ischemic colitis, status post bowel resection with colostomy. History of multiple intra-abdominal abscesses now with purulent drainage at drain site, LEFT lower quadrant pain. EXAM: CT ABDOMEN AND PELVIS WITH CONTRAST TECHNIQUE: Multidetector CT imaging of the abdomen and pelvis was performed using the standard protocol following bolus administration of intravenous contrast. CONTRAST:  132mL OMNIPAQUE IOHEXOL 300 MG/ML  SOLN COMPARISON:  CT abdomen and pelvis February 12, 2015 FINDINGS: LUNG BASES:  Moderate LEFT pleural effusion, similar to prior examination. Bibasilar consolidation with air bronchograms. Included heart size is normal. SOLID ORGANS: The liver demonstrates 1 cm probable cyst segment 7, otherwise unremarkable. Spleen, pancreas and gallbladder are normal. Thickened adrenal glands can be seen with hyperplasia without nodule. Spleen, gallbladder, pancreas and adrenal glands are unremarkable. GASTROINTESTINAL TRACT: Status post  diverting RIGHT lower quadrant colostomy with contrast in the bag. Fluid-filled Hartmann's pouch. Focal small bowel wall thickening in LEFT upper quadrant which is unchanged, apposed to the intraperitoneal abscess. Normal appendix. KIDNEYS/ URINARY TRACT: Kidneys are orthotopic, demonstrating symmetric enhancement. No nephrolithiasis, hydronephrosis or solid renal masses. The unopacified ureters are normal in course and caliber. Delayed imaging through the kidneys demonstrates symmetric prompt contrast excretion within the proximal urinary collecting system. Urinary bladder is well distended and unremarkable. PERITONEUM/RETROPERITONEUM: Contiguous with the colonic stump is a fluid collection with increasing fluid fluid level, LEFT lower quadrant pigtail drainage catheter along the lateral margin of the fluid collection. Large issue shaped component is 8.7 x 4 cm, previously 10.3 x 3.5 cm. There multiple smaller fluid collections the LEFT upper quadrant, contiguous with the greater curvature of the stomach and, the subdiaphragmatic recesses, which measures 6.8 x 4.3 cm, previously 8 x 4.4 cm. 5.1 x 2.9 cm fluid collection contiguous with the lateral margin LEFT psoas muscle was 5.2 x 2.4 cm. Aortoiliac vessels are normal in course and caliber, trace calcific atherosclerosis. No lymphadenopathy by CT size criteria. Prostate size is normal. SOFT TISSUE/OSSEOUS STRUCTURES: Non-suspicious. Small fat containing inguinal hernias. IMPRESSION: Similar size of multi loculated LEFT pelvic and abdominal abscesses with stable position of pigtail drainage catheter. Though the collections are similar in size, there is a larger fluid component, and decreased air component. LEFT upper abdominal abscess do not appear to communicate. 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 Stable small bowel wall thickening LEFT upper quadrant abutting the abscess. No bowel obstruction. Persistent  partially imaged LEFT pleural effusion and bibasilar atelectasis or possible pneumonia. Electronically Signed   By: Elon Alas M.D.   On: 02/22/2015 03:59    Labs:  CBC:  Recent Labs  02/02/15 0228 02/14/15 0938 02/21/15 2313 02/23/15 0922  WBC 7.8 11.5* 9.9 7.1  HGB 12.1* 12.2* 11.3* 11.1*  HCT 38.6* 37.7* 34.3* 34.2*  PLT 323 430* 451* 497*    COAGS:  Recent Labs  02/14/15 0939 02/16/15 1202 02/21/15 2313 02/23/15 0922  INR 1.28 2.1 3.42* 3.41*    BMP:  Recent Labs  02/01/15 0520 02/14/15 0938 02/21/15 2313 02/23/15 0922  NA 138 136 138 139  K 3.9 4.2 4.2 4.2  CL 91* 94* 100* 101  CO2 41* 29 26 27   GLUCOSE 121* 134* 115* 155*  BUN 9 9 12 9   CALCIUM 8.1* 8.9 8.5* 8.8*  CREATININE 0.75 0.85 0.85 0.76  GFRNONAA >60 >60 >60 >60  GFRAA >60 >60 >60 >60    LIVER FUNCTION TESTS:  Recent Labs  01/14/15 0906 01/19/15 0445 02/14/15 0938 02/21/15 2313  BILITOT 1.2 1.1 0.4 0.4  AST 22 47* 22 30  ALT 21 26 40 42  ALKPHOS 85 67 68 57  PROT 6.7 4.9* 7.0 6.6  ALBUMIN 2.6* 1.4* 2.2* 2.0*    Assessment and Plan:  Left abd abscess drain in place Post left colectomy; end colostomy secondary ischemic bowel 01/17/15 Continue drain  Electronically Signed: Nira Visscher A 02/23/2015, 12:17 PM   I spent a total of 15 Minutes at the the  patient's bedside AND on the patient's hospital floor or unit, greater than 50% of which was counseling/coordinating care for l abd abscess drain

## 2015-02-23 NOTE — Progress Notes (Signed)
Attempted to send patient down for chest x-ray as ordered per NP with [facility]CS. Patient adamantly refused to go for a chest x-ray. Pt stated, "I am here for my drain and the doctor didn't mention an x-ray." I reviewed with the patient who wrote the order for the x-ray and why but patient stated he doesn't want any other testing done that isn't related to his drain. Dr. Grandville Silos was notified of refusal. MD stated that [facility]CS will talk to patient about it tomorrow.

## 2015-02-23 NOTE — Progress Notes (Signed)
Pharmacy Antibiotic Note  William Fischer is a 50 y.o. male admitted on 02/21/2015 with bacteremia and cellulitis.  Pharmacy has been consulted for vancomycin and zosyn dosing.  Plan: Vancomycin 2g IV load followed by Vancomycin 1250mg  IV every 8 hours.  Goal trough 15-20 mcg/mL. Zosyn 3.375g IV q8h (4 hour infusion).  Monitor culture data, renal function and clinical course [facility] at [facility]  Weight: 267 lb (121.11 kg)  Temp (24hrs), Avg:98 F (36.7 C), Min:97.3 F (36.3 C), Max:98.3 F (36.8 C)   Recent Labs Lab 02/21/15 2313 02/21/15 2340 02/23/15 0922  WBC 9.9  --  7.1  CREATININE 0.85  --  0.76  LATICACIDVEN  --  1.08  --     Estimated Creatinine Clearance: 148.9 mL/min (by C-G formula based on Cr of 0.76).    Allergies  Allergen Reactions  . Contrast Media [Iodinated Diagnostic Agents] Rash    a diffuse macular rash after CTA chest  Pt was premedicated with 125mg  IV Solumedrol, 50mg  IV Benadryl 1 hr prior to CTexam, and tolerated procedure without any difficulties. 11/28/11 Also same pre med protocol observed on 02/22/15 and pt tolerated the procedure well    Antimicrobials this admission: Vanc 2/5 >>  Zosyn 2/5 >>   Dose adjustments this admission:   Microbiology results: 2/4 BCx: GPC in clusters 1/2 2/5 UCx: sent   Sputum:    MRSA PCR:   Andrey Cota. Diona Foley, PharmD, [place] Clinical Pharmacist Pager [phone] 02/23/2015 11:37 AM

## 2015-02-24 LAB — CULTURE, BLOOD (ROUTINE X 2)

## 2015-02-24 LAB — CBC
HCT: 34.6 % — ABNORMAL LOW (ref 39.0–52.0)
HEMOGLOBIN: 11 g/dL — AB (ref 13.0–17.0)
MCH: 30.6 pg (ref 26.0–34.0)
MCHC: 31.8 g/dL (ref 30.0–36.0)
MCV: 96.1 fL (ref 78.0–100.0)
PLATELETS: 416 10*3/uL — AB (ref 150–400)
RBC: 3.6 MIL/uL — AB (ref 4.22–5.81)
RDW: 17 % — ABNORMAL HIGH (ref 11.5–15.5)
WBC: 10.4 10*3/uL (ref 4.0–10.5)

## 2015-02-24 LAB — PROTIME-INR
INR: 2.27 — ABNORMAL HIGH (ref 0.00–1.49)
PROTHROMBIN TIME: 24.8 s — AB (ref 11.6–15.2)

## 2015-02-24 MED ORDER — WARFARIN SODIUM 5 MG PO TABS: 5.0000 mg | ORAL_TABLET | Freq: Every day | ORAL | Status: DC

## 2015-02-24 MED ORDER — CIPROFLOXACIN HCL 500 MG PO TABS: 500.0000 mg | ORAL_TABLET | Freq: Two times a day (BID) | ORAL | Status: DC

## 2015-02-24 MED ORDER — OXYCODONE HCL 5 MG PO TABS: 5.0000 mg | ORAL_TABLET | ORAL | Status: DC | PRN

## 2015-02-24 NOTE — Progress Notes (Signed)
[place] for Warfarin Indication: atrial fibrillation  Allergies  Allergen Reactions  . Contrast Media [Iodinated Diagnostic Agents] Rash    a diffuse macular rash after CTA chest  Pt was premedicated with 125mg  IV Solumedrol, 50mg  IV Benadryl 1 hr prior to CTexam, and tolerated procedure without any difficulties. 11/28/11 Also same pre med protocol observed on 02/22/15 and pt tolerated the procedure well    Patient Measurements: Height: 5\' 11"  (180.3 cm) Weight: 267 lb (121.11 kg) IBW/kg (Calculated) : 75.3 Heparin Dosing Weight:   Vital Signs: Temp: 97.8 F (36.6 C) (02/07 0849) Temp Source: Oral (02/07 0849) BP: 125/79 mmHg (02/07 0800) Pulse Rate: 107 (02/07 0800)  Labs:  Recent Labs  02/21/15 2313 02/23/15 0922 02/24/15 0528  HGB 11.3* 11.1* 11.0*  HCT 34.3* 34.2* 34.6*  PLT 451* 497* 416*  LABPROT 33.8* 33.7* 24.8*  INR 3.42* 3.41* 2.27*  CREATININE 0.85 0.76  --     Estimated Creatinine Clearance: 151.1 mL/min (by C-G formula based on Cr of 0.76).   Medical History: Past Medical History  Diagnosis Date  . Chicken pox as a child  . Migraine 06/29/2011  . Obesity 06/29/2011  . Depression with anxiety 06/29/2011  . Atrial fibrillation ([place])     admx 11/13 with acute sCHF in setting of RVR  => a. failed DCCV x 2; b. Pradaxa started;  c. failed sotalol  . Chronic systolic heart failure ([place])     a. echo 11/13: Ef 40-45%, diff HK, mod MR, mod LAE, mild RVE, mod RAE, small effusion;   b. TEE 11/13:  EF 35-40%, no LAA clot; Echo 2/14 shows normal EF  . Cardiomyopathy ([place])     likely tachy mediated in setting of AF with RVR  . Snoring     patient needs sleep study - has declined  . Allergy to IVP dye   . Chronic anticoagulation     Pradaxa    Medications:  Prescriptions prior to admission  Medication Sig Dispense Refill Last Dose  . acetaminophen (TYLENOL) 500 MG tablet Take 500-1,000 mg by mouth every 6 (six) hours as  needed for mild pain or headache.   02/20/2015 at Unknown time  . cefdinir (OMNICEF) 300 MG capsule Take 300 mg by mouth 2 (two) times daily.   0 02/21/2015 at Unknown time  . ciprofloxacin (CIPRO) 500 MG tablet Take 1 tablet (500 mg total) by mouth 2 (two) times daily. 28 tablet 0 02/21/2015 at Unknown time  . digoxin (LANOXIN) 0.125 MG tablet Take 1 tablet (0.125 mg total) by mouth daily. 30 tablet 0 02/21/2015 at Unknown time  . diltiazem (CARDIZEM CD) 120 MG 24 hr capsule Take 1 capsule (120 mg total) by mouth 2 (two) times daily. 30 capsule 0 02/21/2015 at Unknown time  . feeding supplement (BOOST / RESOURCE BREEZE) LIQD Take 1 Container by mouth 3 (three) times daily between meals. 12 Container 0 02/21/2015 at Unknown time  . furosemide (LASIX) 40 MG tablet Take 40 mg by mouth daily.   02/21/2015 at Unknown time  . lisinopril (PRINIVIL,ZESTRIL) 5 MG tablet Take 5 mg by mouth daily.   3 02/21/2015 at Unknown time  . Metoprolol Tartrate 75 MG TABS Take 150 mg by mouth 2 (two) times daily. 60 tablet 0 02/21/2015 at 1800  . warfarin (COUMADIN) 5 MG tablet Take 1 tablet (5 mg total) by mouth daily. Take as directed by the Coumadin clinic. 30 tablet 0 02/21/2015 at Unknown time  Scheduled:  . digoxin  0.125 mg Oral Daily  . diltiazem  120 mg Oral BID  . feeding supplement  1 Container Oral TID BM  . Influenza vac split quadrivalent PF  0.5 mL Intramuscular Tomorrow-1000  . lisinopril  5 mg Oral Daily  . metoprolol tartrate  150 mg Oral BID  . piperacillin-tazobactam (ZOSYN)  IV  3.375 g Intravenous Q8H  . vancomycin  1,250 mg Intravenous Q8H  . 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 Pharmacy is consulted to dose warfarin for atrial fibrillation. Pt had last taken warfarin on 2/4 but came in with supratherapeutic INR at 3.42>>3.41 and now 2.27 three days later. Will dose  conservatively.  PTA Warfarin Dose: 5mg /day with last dose on 2/4  Goal of Therapy:  INR 2-3 Monitor platelets by anticoagulation protocol: Yes   Plan:  Warfarin 2.5mg  tonight x1 Daily INR/CBC Monitor s/sx of bleeding  Andrey Cota. Diona Foley, PharmD, [place] Clinical Pharmacist Pager [phone] 02/24/2015,11:05 AM

## 2015-02-24 NOTE — Discharge Instructions (Signed)
1. Take 1/2 dose of your coumadin tonight, then resume normal dose. 2.  Call your cardiologist office.  You need to have INR checked within 1 week. 3.  Dr. Cristal Generous office will call to schedule a follow up in 1 week. 4.  Radiology office will call you to reschedule your appointment 5.  Your home health has been resumed, they can place back your wound VAC tomorrow and get you back on Monday, Wednesday, Friday schedule 6. Continue to take you antibiotics.  You should also take some probiotics to promote good gut bacteria 7.  IT IS NORMAL TO HAVE DRAINAGE FROM YOUR RECTUM IT IS THE ABSCESS OR PUS POCKET EVACUATING WHICH IS WHAT WE WANT TO HAPPEN IN ORDER FOR THIS TO HEAL UP.

## 2015-02-24 NOTE — Discharge Summary (Signed)
Physician Discharge Summary  William Fischer [id number] DOB: 05-22-1965 DOA: 02/21/2015  PCP: No PCP Per Patient  Consultation: cardiology---Dr. Martinique  Admit date: 02/21/2015 Discharge date: 02/24/2015  Recommendations for Outpatient Follow-up:   Follow-up Information    Follow up with [facility], MD In 1 week.   Specialty:  General Surgery   Contact information:   [address] [place] [address] [phone]       Follow up with WAGNER, JAIME, DO.   Specialty:  Interventional Radiology   Why:  radiology office will call you to reschedule your follow up   Contact information:   [address] [place]  [address] [id number]      Discharge Diagnoses:  1. Pelvic abscess 2. Rectal stump leak 3. Atrial fibrillation 4. HTN 5. CHF 6. Left pleural effusion   Surgical Procedure: none  Discharge Condition: stable Disposition: home  Diet recommendation: heart healthy   Filed Weights   02/22/15 0840  Weight: 121.11 kg (267 lb)    Filed Vitals:   02/24/15 1000 02/24/15 1327  BP: 111/75   Pulse: 91   Temp:  97.3 F (36.3 C)  Resp: 29       Hospital Course:  [name] is a 50 year old male s/p exploratory laparotomy, left colectomy, end colostomy for ischemic bowel---Dr. Donne Hazel [id number] complicated by post op intra-abdominal abscesses and rectal stump leak.  He presented to [facility] with LLQ pain and fevers.  He was admitted for IV antibiotics.  IR reconsulted for drain placement.  Discussed with Dr. Earleen Newport who felt the pelvic fluid collections communicated and were being adequately drained with the current drain. Furthermore the LUQ fluid collection and left psoas fluid collection did not require drainage.  He remained stable.  He wa found to have 1/2 positive blood cultures for GPC.  I spoke with Dr. Baxter Flattery who felt that his was contaminated.  Therefore, Vancomycin was stopped and the patient was felt stable for discharge home  with with cipro.  He was told to take 2.5mg  of coumadin tonight and then resume home coumadin dose.  He will call his cardiologist office and follow up within 1 week for an INR check.  IR follow up in 2/9 to be cancelled and rescheduled.  Discussed with Jannifer Franklin, PA. Warning signs that warrant further evaluation were discussed.  Medication risks, benefits and therapeutic alternatives were reviewed with the patient.  He verbalizes understanding. Encouraged to call with questions or  Concerns.    Discharge Instructions     Medication List    STOP taking these medications        cefdinir 300 MG capsule  Commonly known as:  OMNICEF      TAKE these medications        acetaminophen 500 MG tablet  Commonly known as:  TYLENOL  Take 500-1,000 mg by mouth every 6 (six) hours as needed for mild pain or headache.     ciprofloxacin 500 MG tablet  Commonly known as:  CIPRO  Take 1 tablet (500 mg total) by mouth 2 (two) times daily.     digoxin 0.125 MG tablet  Commonly known as:  LANOXIN  Take 1 tablet (0.125 mg total) by mouth daily.     diltiazem 120 MG 24 hr capsule  Commonly known as:  CARDIZEM CD  Take 1 capsule (120 mg total) by mouth 2 (two) times daily.     feeding supplement Liqd  Take 1 Container by mouth  3 (three) times daily between meals.     furosemide 40 MG tablet  Commonly known as:  LASIX  Take 40 mg by mouth daily.     lisinopril 5 MG tablet  Commonly known as:  PRINIVIL,ZESTRIL  Take 5 mg by mouth daily.     Metoprolol Tartrate 75 MG Tabs  Take 150 mg by mouth 2 (two) times daily.     oxyCODONE 5 MG immediate release tablet  Commonly known as:  Oxy IR/ROXICODONE  Take 1-2 tablets (5-10 mg total) by mouth every 4 (four) hours as needed for moderate pain.     warfarin 5 MG tablet  Commonly known as:  COUMADIN  Take 1 tablet (5 mg total) by mouth daily. Take as directed by the Coumadin clinic. Take 1/2 tablet tonight 2/7.  Then resume your normal dose.  INR  in 1 week           Follow-up Information    Follow up with [facility], MD In 1 week.   Specialty:  General Surgery   Contact information:   [address] [place] [address] [phone]       Follow up with WAGNER, JAIME, DO.   Specialty:  Interventional Radiology   Why:  radiology office will call you to reschedule your follow up   Contact information:   [place] [address] [phone]        The results of significant diagnostics from this hospitalization (including imaging, microbiology, ancillary and laboratory) are listed below for reference.    Significant Diagnostic Studies: Dg Chest 2 View  01/29/2015  CLINICAL DATA:  Congestive heart failure EXAM: CHEST  2 VIEW COMPARISON:  01/27/2015 FINDINGS: Cardiomegaly again noted. Persistent mild congestion/ pulmonary edema. Bilateral small pleural effusion with bilateral basilar atelectasis or infiltrate. IMPRESSION: Persistent mild congestion/ pulmonary edema. Bilateral small pleural effusion with bilateral basilar atelectasis or infiltrate. Electronically Signed   By: Lahoma Crocker M.D.   On: 01/29/2015 18:41   Ct Chest W Contrast  01/28/2015  CLINICAL DATA:  Abscess.  Perforated diverticulitis. EXAM: CT CHEST, ABDOMEN, AND PELVIS WITH CONTRAST TECHNIQUE: Multidetector CT imaging of the chest, abdomen and pelvis was performed following the standard protocol during bolus administration of intravenous contrast. CONTRAST:  139mL OMNIPAQUE IOHEXOL 300 MG/ML  SOLN COMPARISON:  01/17/2015 FINDINGS: CT CHEST Mediastinum: The heart size is mildly enlarged. No pericardial effusion identified. The trachea appears patent and is midline. Unremarkable appearance of the esophagus. Multiple small lymph nodes are noted within the mediastinum. Likely reactive. No adenopathy identified. No supraclavicular or axillary adenopathy. Lungs/Pleura: There is a moderate volume left pleural effusion. A small  loculated right pleural effusion is identified, image 85 of series 5. Atelectasis is noted within the right middle lobe and right lower lobe. There is airspace consolidation involving the left lower lobe. Musculoskeletal: No aggressive lytic or sclerotic bone lesions are identified. CT ABDOMEN AND PELVIS Hepatobiliary: There is a low density structure within the posterior right lobe of liver measuring 11 mm, image 52 of series 2. This is unchanged from previous exam. The gallbladder is normal. No biliary dilatation. Pancreas: Negative Spleen: Negative Adrenals/Urinary Tract: The normal appearance of the adrenal glands. The kidneys are unremarkable. Urinary bladder is negative. Stomach/Bowel: Stomach has a normal caliber. There is no abnormal pathologic dilatation of the small bowel loops. The appendix is visualized and appears normal. The proximal scratch set there is a right sided colostomy. The proximal large bowel  loops have a normal caliber. There is been dehiscence of the Hartman's pouch along the suture line. There is an associated large complex fluid collection involving the left side of abdomen measuring 13.5 x 8.3 by 30.9 cm. This extends into the upper abdomen an abuts the greater curvature of the stomach. There are multiple smaller fluid collections scratch set there are smaller collections of gas and fluid identified elsewhere within the abdomen particularly along the left pericolic gutter and retroperitoneum and extending over the spleen. No significant fluid collections identified within the right side of abdomen. Vascular/Lymphatic: Normal appearance of the abdominal aorta. No enlarged retroperitoneal or mesenteric adenopathy. No enlarged pelvic or inguinal lymph nodes. Reproductive: The prostate gland and seminal vesicles appear normal. Other: Multiple fluid collections secondary to dehiscence of Hartman's pouch identified. Musculoskeletal: No aggressive lytic or sclerotic bone lesions. IMPRESSION:  1. Examination is significant for dehiscence of the Hartman's pouch along the suture line. There is a resultant large complex fluid collection involving much of the left hemi abdomen. Multiple smaller gas and fluid collections are identified scattered throughout the left abdomen especially within the left pericolic gutter and retroperitoneal space posterior to the left kidney. 2. Partially loculated right pleural effusion. 3. Moderate left effusion with overlying airspace consolidation. Critical Value/emergent results were called by telephone at the time of interpretation on 01/28/2015 at 1:41 pm to Dr. Hulen Luster, who verbally acknowledged these results. Electronically Signed   By: Kerby Moors M.D.   On: 01/28/2015 13:41   Ct Abdomen Pelvis W Contrast  02/22/2015  CLINICAL DATA:  Ischemic colitis, status post bowel resection with colostomy. History of multiple intra-abdominal abscesses now with purulent drainage at drain site, LEFT lower quadrant pain. EXAM: CT ABDOMEN AND PELVIS WITH CONTRAST TECHNIQUE: Multidetector CT imaging of the abdomen and pelvis was performed using the standard protocol following bolus administration of intravenous contrast. CONTRAST:  181mL OMNIPAQUE IOHEXOL 300 MG/ML  SOLN COMPARISON:  CT abdomen and pelvis February 12, 2015 FINDINGS: LUNG BASES: Moderate LEFT pleural effusion, similar to prior examination. Bibasilar consolidation with air bronchograms. Included heart size is normal. SOLID ORGANS: The liver demonstrates 1 cm probable cyst segment 7, otherwise unremarkable. Spleen, pancreas and gallbladder are normal. Thickened adrenal glands can be seen with hyperplasia without nodule. Spleen, gallbladder, pancreas and adrenal glands are unremarkable. GASTROINTESTINAL TRACT: Status post diverting RIGHT lower quadrant colostomy with contrast in the bag. Fluid-filled Hartmann's pouch. Focal small bowel wall thickening in LEFT upper quadrant which is unchanged, apposed to the intraperitoneal  abscess. Normal appendix. KIDNEYS/ URINARY TRACT: Kidneys are orthotopic, demonstrating symmetric enhancement. No nephrolithiasis, hydronephrosis or solid renal masses. The unopacified ureters are normal in course and caliber. Delayed imaging through the kidneys demonstrates symmetric prompt contrast excretion within the proximal urinary collecting system. Urinary bladder is well distended and unremarkable. PERITONEUM/RETROPERITONEUM: Contiguous with the colonic stump is a fluid collection with increasing fluid fluid level, LEFT lower quadrant pigtail drainage catheter along the lateral margin of the fluid collection. Large issue shaped component is 8.7 x 4 cm, previously 10.3 x 3.5 cm. There multiple smaller fluid collections the LEFT upper quadrant, contiguous with the greater curvature of the stomach and, the subdiaphragmatic recesses, which measures 6.8 x 4.3 cm, previously 8 x 4.4 cm. 5.1 x 2.9 cm fluid collection contiguous with the lateral margin LEFT psoas muscle was 5.2 x 2.4 cm. Aortoiliac vessels are normal in course and caliber, trace calcific atherosclerosis. No lymphadenopathy by CT size criteria. Prostate size is normal. SOFT TISSUE/OSSEOUS STRUCTURES:  Non-suspicious. Small fat containing inguinal hernias. IMPRESSION: Similar size of multi loculated LEFT pelvic and abdominal abscesses with stable position of pigtail drainage catheter. Though the collections are similar in size, there is a larger fluid component, and decreased air component. LEFT upper abdominal abscess do not appear to communicate. 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 Stable small bowel wall thickening LEFT upper quadrant abutting the abscess. No bowel obstruction. Persistent partially imaged LEFT pleural effusion and bibasilar atelectasis or possible pneumonia. Electronically Signed   By: Elon Alas M.D.   On: 02/22/2015 03:59   Ct Abdomen Pelvis W Contrast  02/12/2015   CLINICAL DATA:  Follow-up intra-abdominal abscesses and drainage catheter. History of bowel perforation and subsequent postoperative abscess. EXAM: CT ABDOMEN AND PELVIS WITH CONTRAST TECHNIQUE: Multidetector CT imaging of the abdomen and pelvis was performed using the standard protocol following bolus administration of intravenous contrast. CONTRAST:  153mL ISOVUE-300 IOPAMIDOL (ISOVUE-300) INJECTION 61% COMPARISON:  None. 01/28/2015 and 01/14/2015 FINDINGS: Lower chest: There is a punctate peripheral nodule in the right lower lobe on sequence 4, image 88 which is indeterminate but probably unchanged. There continues to be a small to moderate sized left pleural effusion with compressive atelectasis. The small amount of right pleural fluid has resolved. Evidence for atelectasis or volume loss at the right lung base. Hepatobiliary: Again noted is a focal low-density structure along the posterior right hepatic lobe on sequence 3, image 15 that roughly measures 1.3 cm. This is grossly unchanged and likely represents a benign etiology such as a cyst. No gross abnormality to the liver and the portal venous system is patent. Pancreas: Normal appearance of the pancreas. The it air-fluid collection along the posterior and inferior aspect of the pancreatic body has decreased in size. No pancreatic duct dilatation. Spleen: Normal appearance of the spleen. The air-fluid collection near the splenic hilum has markedly decreased in size. There continues to be a complex air-fluid collection medial to the spleen. This area measures 8.0 x 4.4 cm on sequence 3, image 18 and previously measured 9.5 x 4.8 cm at a similar level. Adrenals/Urinary Tract: Stable fullness and possible nodule involving the right adrenal lateral limb. There is a stable fullness in the left adrenal suggestive for hyperplasia. The right adrenal nodule could represent a small adenoma but indeterminate. No acute abnormality involving the kidneys. Normal  appearance of the urinary bladder. Stomach/Bowel: Postsurgical changes associated with a left colectomy with a Hartmann's pouch. There is an air-fluid collection associated with the Hartmann's pouch on sequence 3, image 75 and this is associated with a large abscess extending along the left side of the abdomen. There continues to be low-density material within the Hartmann's pouch but the pouch is more decompressed than the previous examination. Mesenteric stranding and inflammatory changes along the left side of the abdomen adjacent loops of small bowel. Mild wall thickening involving the proximal jejunum on sequence 3, image 50. Distal small bowel is decompressed. No gross abnormality to the right colon and there is a colostomy on the right side of the abdomen. Vascular/Lymphatic: Again noted are prominent lymph nodes in the upper abdomen. Conglomeration of nodes near the porta hepatis measures up to 2.8 cm on sequence 3, images 26 and previously measured roughly 2.3 cm. Again noted are some scattered periaortic lymph nodes. Minimal atherosclerotic disease in the right common iliac artery. No evidence for an aortic aneurysm. Reproductive: Normal appearance of the prostate and seminal vesicles. Other: Percutaneous abscess drain  in the left lower quadrant the abdomen remains well positioned within the abscess cavity. The abscess cavity has been decompressed compared to the prior examination but there continues to be a large air-fluid collection extending up the left side of the abdomen. Difficult to measure the size of this abscess due to its large irregular shape. At the level of the left kidney, this collection roughly measures 10.3 x 3.5 cm and previously measured 15.3 x 8.2 cm. There continues to be additional non continuous air-fluid collections throughout the abdomen. There is a complex air-fluid collection along the left side of the left psoas muscle on sequence 3, image 49 that measures 5.5 x 2.2 cm and  previously measured 7.5 x 3.4 cm. Air-fluid collection medial to the spleen has decreased in size as described above. There continues to be small foci of air anterior to the spleen but these extraluminal collections have markedly decreased in size. Postsurgical changes along the anterior abdomen compatible with an open wound with a wound VAC. Musculoskeletal:  No acute bone abnormality. IMPRESSION: The large abscess collection along the left side of the abdomen has decreased in size but there continues to be a large amount of gas and fluid within the collection. 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 Additional air-fluid collections scattered throughout the left side of the abdomen but these have all decreased in size. Percutaneous abscess drain is well positioned along the caudal aspect of the abscess. Persistent left pleural effusion. Resolution of the right pleural fluid. Prominent lymph nodes in the periportal region. Cannot exclude interval enlargement of these nodes and recommend attention on follow up imaging. Few incidental finding, such as a right adrenal nodule and punctate right lung nodule. Recommend attention to these on follow up imaging. Electronically Signed   By: Markus Daft M.D.   On: 02/12/2015 12:52   Ct Abdomen Pelvis W Contrast  01/28/2015  CLINICAL DATA:  Abscess.  Perforated diverticulitis. EXAM: CT CHEST, ABDOMEN, AND PELVIS WITH CONTRAST TECHNIQUE: Multidetector CT imaging of the chest, abdomen and pelvis was performed following the standard protocol during bolus administration of intravenous contrast. CONTRAST:  141mL OMNIPAQUE IOHEXOL 300 MG/ML  SOLN COMPARISON:  01/17/2015 FINDINGS: CT CHEST Mediastinum: The heart size is mildly enlarged. No pericardial effusion identified. The trachea appears patent and is midline. Unremarkable appearance of the esophagus. Multiple small lymph nodes are noted within the mediastinum.  Likely reactive. No adenopathy identified. No supraclavicular or axillary adenopathy. Lungs/Pleura: There is a moderate volume left pleural effusion. A small loculated right pleural effusion is identified, image 85 of series 5. Atelectasis is noted within the right middle lobe and right lower lobe. There is airspace consolidation involving the left lower lobe. Musculoskeletal: No aggressive lytic or sclerotic bone lesions are identified. CT ABDOMEN AND PELVIS Hepatobiliary: There is a low density structure within the posterior right lobe of liver measuring 11 mm, image 52 of series 2. This is unchanged from previous exam. The gallbladder is normal. No biliary dilatation. Pancreas: Negative Spleen: Negative Adrenals/Urinary Tract: The normal appearance of the adrenal glands. The kidneys are unremarkable. Urinary bladder is negative. Stomach/Bowel: Stomach has a normal caliber. There is no abnormal pathologic dilatation of the small bowel loops. The appendix is visualized and appears normal. The proximal scratch set there is a right sided colostomy. The proximal large bowel loops have a normal caliber. There is been dehiscence of the Hartman's pouch along the suture line. There is  an associated large complex fluid collection involving the left side of abdomen measuring 13.5 x 8.3 by 30.9 cm. This extends into the upper abdomen an abuts the greater curvature of the stomach. There are multiple smaller fluid collections scratch set there are smaller collections of gas and fluid identified elsewhere within the abdomen particularly along the left pericolic gutter and retroperitoneum and extending over the spleen. No significant fluid collections identified within the right side of abdomen. Vascular/Lymphatic: Normal appearance of the abdominal aorta. No enlarged retroperitoneal or mesenteric adenopathy. No enlarged pelvic or inguinal lymph nodes. Reproductive: The prostate gland and seminal vesicles appear normal. Other:  Multiple fluid collections secondary to dehiscence of Hartman's pouch identified. Musculoskeletal: No aggressive lytic or sclerotic bone lesions. IMPRESSION: 1. Examination is significant for dehiscence of the Hartman's pouch along the suture line. There is a resultant large complex fluid collection involving much of the left hemi abdomen. Multiple smaller gas and fluid collections are identified scattered throughout the left abdomen especially within the left pericolic gutter and retroperitoneal space posterior to the left kidney. 2. Partially loculated right pleural effusion. 3. Moderate left effusion with overlying airspace consolidation. Critical Value/emergent results were called by telephone at the time of interpretation on 01/28/2015 at 1:41 pm to Dr. Hulen Luster, who verbally acknowledged these results. Electronically Signed   By: Kerby Moors M.D.   On: 01/28/2015 13:41   Dg Chest Port 1 View  01/27/2015  CLINICAL DATA:  Respiratory distress. EXAM: PORTABLE CHEST 1 VIEW COMPARISON:  January 26, 2015. FINDINGS: Stable cardiomegaly and central pulmonary vascular congestion is noted. Bilateral perihilar and basilar interstitial densities are noted concerning for edema. Stable bilateral pleural effusions are noted, with left greater than right. No pneumothorax is noted. IMPRESSION: Stable bilateral opacities most consistent with edema with associated pleural effusions. Electronically Signed   By: Marijo Conception, M.D.   On: 01/27/2015 21:19   Dg Chest Port 1 View  01/26/2015  CLINICAL DATA:  Pulmonary edema and shortness of breath EXAM: PORTABLE CHEST 1 VIEW COMPARISON:  Yesterday FINDINGS: Unchanged hazy appearance of the bilateral chest, left more than right, compatible with effusions. The left effusion is likely moderate volume. There is underlying symmetric interstitial and airspace opacity. Chronic cardiomegaly and stable vascular pedicle widening, accentuated by rightward rotation. No pneumothorax.  IMPRESSION: Unchanged CHF pattern including left more than right pleural effusion. Electronically Signed   By: Monte Fantasia M.D.   On: 01/26/2015 11:29   Ct Image Guided Drainage By Percutaneous Catheter  02/19/2015  ADDENDUM REPORT: 02/19/2015 17:58 ADDENDUM: Addendum created to address sedation documentation. Total sedation time 15 minutes. The patient's level of consciousness and physiologic status monitored by Radiology Nursing. Electronically Signed   By: Corrie Mckusick D.O.   On: 02/19/2015 17:58  02/19/2015  CLINICAL DATA:  50 year old male with a history of perioperative abscess. EXAM: CT GUIDED DRAINAGE OF ABDOMINAL ABSCESS ANESTHESIA/SEDATION: 1.5 Mg IV Versed 100 mcg IV Fentanyl Total Moderate Sedation Time:  15 minutes PROCEDURE: The procedure, risks, benefits, and alternatives were explained to the patient. Questions regarding the procedure were encouraged and answered. The patient understands and consents to the procedure. The LEFT LOWER ABDOMINAL WALL was prepped with CHLORHEXIDINEin a sterile fashion, and a sterile drape was applied covering the operative field. A sterile gown and sterile gloves were used for the procedure. Local anesthesia was provided with 1% Lidocaine. Once the patient was prepped and draped in the sterile fashion, the skin and subcutaneous tissues were generously infiltrated with  1% lidocaine for local anesthesia. A small stab incision was made with 11 blade scalpel, a Yueh needle was advanced under CT guidance into the gas and fluid collection of the left lower abdomen. Using modified Seldinger technique, a 14 French drain was placed with serial dilation with 12 [place] and 14 [place] dilators. Pigtail catheter was formed, and aspiration of approximately were 1000 cc of feculent fluid was aspirated. Sample was sent to the lab. Catheter was sutured in position attached to gravity drainage. Patient tolerated the procedure well and remained hemodynamically stable throughout. No  complications encountered and no significant blood loss encountered. COMPLICATIONS: None FINDINGS: CT demonstrates gas and fluid collection within the left lower quadrant. Decreased size status post drainage of approximately 1 L of fluid. No complicating features. IMPRESSION: Status post CT-guided drain placement into left abdominal abscess. Approximately 1000 cc of feculent fluid aspirated. Sample was sent to the lab for analysis. Signed, Dulcy Fanny. Earleen Newport, DO [facility] Electronically Signed: By: Corrie Mckusick D.O. On: 01/29/2015 16:25    Microbiology: Recent Results (from the past 240 hour(s))  Blood culture (routine x 2)     Status: None   Collection Time: 02/21/15 11:13 PM  Result Value Ref Range Status   Specimen Description BLOOD LEFT HAND  Final   Special Requests BOTTLES DRAWN AEROBIC AND ANAEROBIC 5CC   Final   Culture  Setup Time   Final    AEROBIC BOTTLE ONLY GRAM POSITIVE COCCI IN CLUSTERS CRITICAL RESULT CALLED TO, READ BACK BY AND VERIFIED WITH: C.HAGERMAN,RN 2038 02/22/15 M.CAMPBELL    Culture   Final    STAPHYLOCOCCUS SPECIES (COAGULASE NEGATIVE) THE SIGNIFICANCE OF ISOLATING THIS ORGANISM FROM A SINGLE SET OF BLOOD CULTURES WHEN MULTIPLE SETS ARE DRAWN IS UNCERTAIN. PLEASE NOTIFY THE MICROBIOLOGY DEPARTMENT WITHIN ONE WEEK IF SPECIATION AND SENSITIVITIES ARE REQUIRED.    Report Status 02/24/2015 FINAL  Final  Blood culture (routine x 2)     Status: None (Preliminary result)   Collection Time: 02/21/15 11:25 PM  Result Value Ref Range Status   Specimen Description BLOOD LEFT WRIST  Final   Special Requests BOTTLES DRAWN AEROBIC AND ANAEROBIC 5CC   Final   Culture NO GROWTH 2 DAYS  Final   Report Status PENDING  Incomplete  Urine culture     Status: None   Collection Time: 02/22/15 12:36 AM  Result Value Ref Range Status   Specimen Description URINE, RANDOM  Final   Special Requests NONE  Final   Culture 5,000  COLONIES/mL INSIGNIFICANT GROWTH  Final   Report Status 02/23/2015 FINAL  Final     Labs: Basic Metabolic Panel:  Recent Labs Lab 02/21/15 2313 02/23/15 0922  NA 138 139  K 4.2 4.2  CL 100* 101  CO2 26 27  GLUCOSE 115* 155*  BUN 12 9  CREATININE 0.85 0.76  CALCIUM 8.5* 8.8*   Liver Function Tests:  Recent Labs Lab 02/21/15 2313  AST 30  ALT 42  ALKPHOS 57  BILITOT 0.4  PROT 6.6  ALBUMIN 2.0*   No results for input(s): LIPASE, AMYLASE in the last 168 hours. No results for input(s): AMMONIA in the last 168 hours. CBC:  Recent Labs Lab 02/21/15 2313 02/23/15 0922 02/24/15 0528  WBC 9.9 7.1 10.4  NEUTROABS 5.3  --   --   HGB 11.3* 11.1* 11.0*  HCT 34.3* 34.2* 34.6*  MCV 95.5 97.4 96.1  PLT 451* 497* 416*   Cardiac Enzymes: No results  for input(s): CKTOTAL, CKMB, CKMBINDEX, TROPONINI in the last 168 hours. BNP: BNP (last 3 results)  Recent Labs  01/26/15 0432  BNP 301.9*    ProBNP (last 3 results) No results for input(s): PROBNP in the last 8760 hours.  CBG: No results for input(s): GLUCAP in the last 168 hours.  Principal Problem:   Abscess of abdominal cavity (HCC) Active Problems:   Obesity   Atrial fibrillation ([place])   Chronic systolic heart failure ([place])   Long term (current) use of anticoagulants [Z79.01]   Time coordinating discharge: <30 mins  Signed:  Andreika Vandagriff, ANP-BC

## 2015-02-24 NOTE — Progress Notes (Signed)
Pt given discharge packet, and prescriptions. Follow up appointments explained to patient. Nurse secretary attempted to make appointment with Dr. Cristal Generous office, the office has no openings this week and per office they should be contacting him with follow up appointment by tomorrow. Pt reminded to have wound vac placed back on tomorrow by home health nurse, and to resume previous schedule. Patient states he understands and verbalized understanding back. IV's removed. Will continue to monitor.

## 2015-02-24 NOTE — Care Management Note (Addendum)
Case Management Note  Patient Details  Name: William Fischer MRN: [id number] Date of Birth: [birth year]  Subjective/Objective:     Date: 02/24/15 Spoke with patient at the bedside .  Introduced self as Tourist information centre manager and explained role in discharge planning and how to be reached.  Verified patient lives in town, with teenage son. Has wound vac from [facility] in his room that he uses at home.   Expressed potential need for no other DME.  Verified patient anticipates to go home with family, at time of discharge and would like to resume [facility] with [facility] for wound/drain care, also has colostomy. Patient denied needing help with their medication.  Patient  is driven by mom to MD appointments.  Verified Dr. Rolm Bookbinder has been following patient.  NCM notified Butch Penny with [facility] that patient is being dc today, he has his [facility] wound vac in a bag in the room.  Plan: CM will continue to follow for discharge planning and [facility] resources.                Action/Plan:   Expected Discharge Date:                  Expected Discharge Plan:  [place]  In-House Referral:     Discharge planning Services  CM Consult  Post Acute Care Choice:  Resumption of Svcs/PTA Provider, Home Health Choice offered to:  Patient  DME Arranged:    DME Agency:     HH Arranged:  RN [place] Agency:  [facility]  Status of Service:  Completed, signed off  Medicare Important Message Given:    Date Medicare IM Given:    Medicare IM give by:    Date Additional Medicare IM Given:    Additional Medicare Important Message give by:     If discussed at [place] of Stay Meetings, dates discussed:    Additional Comments:  Zenon Mayo, RN 02/24/2015, 10:35 AM

## 2015-02-24 NOTE — Progress Notes (Signed)
Patient ID: William Fischer, male   DOB: 1965-08-19, 50 y.o.   MRN: [id number]     [address] [place], [place] [address]    Phone: [phone] FAX: [phone]     Subjective: Sob at baseline. Having rectal discharge, purulent. Afebrile.  VSS.     Objective:  Vital signs:  Filed Vitals:   02/24/15 0400 02/24/15 0600 02/24/15 0800 02/24/15 0849  BP: 105/73 112/63 125/79   Pulse: 103 101 107   Temp:    97.8 F (36.6 C)  TempSrc:    Oral  Resp: 23 22 24    Height:      Weight:      SpO2: 96% 96% 94%     Last BM Date: 02/23/15  Intake/Output   Yesterday:  02/06 0701 - 02/07 0700 In: 3249.5 [P.O.:1090; I.V.:1499.5; IV Piggyback:650] Out: 8502 [Urine:4175; Drains:165; Stool:650] This shift:  Total I/O In: 120 [P.O.:120] Out: -    Physical Exam: General: Pt awake/alert/oriented x4 in no acute distress Chest: cta No chest wall pain w good excursion CV: S1S2 irregularly irregular rate and rhythm.  Abdomen: abdomen is soft, non tender. rlq ostomy is functioning. Midline wound with vac in place. llq with foul smelling purulent drainage.  Ext: SCDs BLE. No mjr edema. No cyanosis Skin: No petechiae / purpura   Problem List:   Principal Problem:   Abscess of abdominal cavity (HCC) Active Problems:   Obesity   Atrial fibrillation (HCC)   Chronic systolic heart failure (HCC)   Long term (current) use of anticoagulants [Z79.01]    Results:   Labs: Results for orders placed or performed during the hospital encounter of 02/21/15 (from the past 48 hour(s))  Protime-INR     Status: Abnormal   Collection Time: 02/23/15  9:22 AM  Result Value Ref Range   Prothrombin Time 33.7 (H) 11.6 - 15.2 seconds   INR 3.41 (H) 0.00 - 1.49  CBC     Status: Abnormal   Collection Time: 02/23/15  9:22 AM  Result Value Ref Range   WBC 7.1 4.0 - 10.5 K/uL   RBC 3.51 (L) 4.22 - 5.81 MIL/uL   Hemoglobin 11.1  (L) 13.0 - 17.0 g/dL   HCT 34.2 (L) 39.0 - 52.0 %   MCV 97.4 78.0 - 100.0 fL   MCH 31.6 26.0 - 34.0 pg   MCHC 32.5 30.0 - 36.0 g/dL   RDW 17.0 (H) 11.5 - 15.5 %   Platelets 497 (H) 150 - 400 K/uL  Basic metabolic panel     Status: Abnormal   Collection Time: 02/23/15  9:22 AM  Result Value Ref Range   Sodium 139 135 - 145 mmol/L   Potassium 4.2 3.5 - 5.1 mmol/L   Chloride 101 101 - 111 mmol/L   CO2 27 22 - 32 mmol/L   Glucose, Bld 155 (H) 65 - 99 mg/dL   BUN 9 6 - 20 mg/dL   Creatinine, Ser 0.76 0.61 - 1.24 mg/dL   Calcium 8.8 (L) 8.9 - 10.3 mg/dL   GFR calc non Af Amer >60 >60 mL/min   GFR calc Af Amer >60 >60 mL/min    Comment: (NOTE) The eGFR has been calculated using the CKD EPI equation. This calculation has not been validated in all clinical situations. eGFR's persistently <60 mL/min signify possible Chronic Kidney Disease.    Anion gap 11 5 - 15  CBC  Status: Abnormal   Collection Time: 02/24/15  5:28 AM  Result Value Ref Range   WBC 10.4 4.0 - 10.5 K/uL   RBC 3.60 (L) 4.22 - 5.81 MIL/uL   Hemoglobin 11.0 (L) 13.0 - 17.0 g/dL   HCT 34.6 (L) 39.0 - 52.0 %   MCV 96.1 78.0 - 100.0 fL   MCH 30.6 26.0 - 34.0 pg   MCHC 31.8 30.0 - 36.0 g/dL   RDW 17.0 (H) 11.5 - 15.5 %   Platelets 416 (H) 150 - 400 K/uL  Protime-INR     Status: Abnormal   Collection Time: 02/24/15  5:28 AM  Result Value Ref Range   Prothrombin Time 24.8 (H) 11.6 - 15.2 seconds   INR 2.27 (H) 0.00 - 1.49    Imaging / Studies: No results found.  Medications / Allergies:  Scheduled Meds: . digoxin  0.125 mg Oral Daily  . diltiazem  120 mg Oral BID  . feeding supplement  1 Container Oral TID BM  . Influenza vac split quadrivalent PF  0.5 mL Intramuscular Tomorrow-1000  . lisinopril  5 mg Oral Daily  . metoprolol tartrate  150 mg Oral BID  . piperacillin-tazobactam (ZOSYN)  IV  3.375 g Intravenous Q8H  . vancomycin  1,250 mg Intravenous Q8H  . Warfarin - Pharmacist Dosing Inpatient   Does not  apply q1800   Continuous Infusions: . 0.9 % NaCl with KCl 20 mEq / L 50 mL/hr at 02/24/15 0920   PRN Meds:.acetaminophen **OR** acetaminophen, HYDROmorphone (DILAUDID) injection, ondansetron **OR** ondansetron (ZOFRAN) IV, oxyCODONE  Antibiotics: Anti-infectives    Start     Dose/Rate Route Frequency Ordered Stop   02/23/15 2000  vancomycin (VANCOCIN) 1,250 mg in sodium chloride 0.9 % 250 mL IVPB     1,250 mg 166.7 mL/hr over 90 Minutes Intravenous Every 8 hours 02/23/15 1140     02/23/15 1200  vancomycin (VANCOCIN) 2,000 mg in sodium chloride 0.9 % 500 mL IVPB     2,000 mg 250 mL/hr over 120 Minutes Intravenous  Once 02/23/15 1140 02/23/15 1423   02/22/15 0800  vancomycin (VANCOCIN) IVPB 1000 mg/200 mL premix  Status:  Discontinued     1,000 mg 200 mL/hr over 60 Minutes Intravenous Every 8 hours 02/22/15 0709 02/22/15 0752   02/22/15 0800  piperacillin-tazobactam (ZOSYN) IVPB 3.375 g     3.375 g 12.5 mL/hr over 240 Minutes Intravenous Every 8 hours 02/22/15 0709     02/21/15 2330  piperacillin-tazobactam (ZOSYN) IVPB 3.375 g     3.375 g 100 mL/hr over 30 Minutes Intravenous  Once 02/21/15 2322 02/22/15 0049   02/21/15 2330  vancomycin (VANCOCIN) IVPB 1000 mg/200 mL premix  Status:  Discontinued     1,000 mg 200 mL/hr over 60 Minutes Intravenous  Once 02/21/15 2322 02/21/15 2324   02/21/15 2330  vancomycin (VANCOCIN) 2,000 mg in sodium chloride 0.9 % 500 mL IVPB     2,000 mg 250 mL/hr over 120 Minutes Intravenous  Once 02/21/15 2324 02/22/15 0035        Assessment/Plan Exploratory laparotomy, left colectomy, end colostomy for ischemic bowel---Dr. Donne Hazel 01/17/15 Rectal stump leak  Pelvic abscess  -d/w IR fluid collection communicate, continue with IR drain.  Left psoas and LUQ fluid collections do not need drainage. -continue with antibiotics.  -VAC taken down, will get him back on MWF changes  CV-AFIB-cardizem. resume coumadin as no intervention is needed.  HTN-home  meds, watch for hypotension. Chronic systolic heart failure-sliv VTE prophylaxis-SCD, lovenox when  INR <2 ID-zosyn. Previous cultures grew e coli. GPC 1/2 BCs-->Vanc.  Afebrile, normal WBC, will ask ID for recs Left pleural effusion-pt refuses further work up.  Discussed risks and verbalizes understanding.  FEN-advance diet Dispo-to floor.  Home soon  Erby Pian, [facility] Pager [phone]) For consults and floor pages call [phone](7A-4:30P)  02/24/2015 10:30 AM

## 2015-02-24 NOTE — Progress Notes (Signed)
Referring Physician(s): [place]  Chief Complaint:  Left abd abscess drain placed 2/2   Subjective:  Output of drain 165 cc today Feels some better daily Home soon  Allergies: Contrast media  Medications: Prior to Admission medications   Medication Sig Start Date End Date Taking? Authorizing Provider  acetaminophen (TYLENOL) 500 MG tablet Take 500-1,000 mg by mouth every 6 (six) hours as needed for mild pain or headache.   Yes Historical Provider, MD  cefdinir (OMNICEF) 300 MG capsule Take 300 mg by mouth 2 (two) times daily.  02/02/15  Yes Historical Provider, MD  ciprofloxacin (CIPRO) 500 MG tablet Take 1 tablet (500 mg total) by mouth 2 (two) times daily. 02/14/15  Yes Carlisle Cater, PA-C  digoxin (LANOXIN) 0.125 MG tablet Take 1 tablet (0.125 mg total) by mouth daily. 02/02/15  Yes Liberty Handy, MD  diltiazem (CARDIZEM CD) 120 MG 24 hr capsule Take 1 capsule (120 mg total) by mouth 2 (two) times daily. 02/09/15  Yes Sherran Needs, NP  feeding supplement (BOOST / RESOURCE BREEZE) LIQD Take 1 Container by mouth 3 (three) times daily between meals. 02/02/15  Yes Liberty Handy, MD  furosemide (LASIX) 40 MG tablet Take 40 mg by mouth daily.   Yes Historical Provider, MD  lisinopril (PRINIVIL,ZESTRIL) 5 MG tablet Take 5 mg by mouth daily.  01/01/15  Yes Historical Provider, MD  Metoprolol Tartrate 75 MG TABS Take 150 mg by mouth 2 (two) times daily. 02/02/15  Yes Liberty Handy, MD  warfarin (COUMADIN) 5 MG tablet Take 1 tablet (5 mg total) by mouth daily. Take as directed by the Coumadin clinic. 02/11/15  Yes Thompson Grayer, MD  oxyCODONE (OXY IR/ROXICODONE) 5 MG immediate release tablet Take 1-2 tablets (5-10 mg total) by mouth every 4 (four) hours as needed for moderate pain. 02/24/15   Erby Pian, NP     Vital Signs: BP 111/75 mmHg  Pulse 91  Temp(Src) 97.3 F (36.3 C) (Oral)  Resp 29  Ht 5\' 11"  (1.803 m)  Wt 267 lb (121.11 kg)  SpO2 92%  Physical Exam  Skin: Skin is warm  and dry.  Drain site clean and dry NT  No bleeding No sign of drainage from site Output 165 cc--purulent and odorous Wbc wnl afeb    Imaging: Ct Abdomen Pelvis W Contrast  02/22/2015  CLINICAL DATA:  Ischemic colitis, status post bowel resection with colostomy. History of multiple intra-abdominal abscesses now with purulent drainage at drain site, LEFT lower quadrant pain. EXAM: CT ABDOMEN AND PELVIS WITH CONTRAST TECHNIQUE: Multidetector CT imaging of the abdomen and pelvis was performed using the standard protocol following bolus administration of intravenous contrast. CONTRAST:  167mL OMNIPAQUE IOHEXOL 300 MG/ML  SOLN COMPARISON:  CT abdomen and pelvis February 12, 2015 FINDINGS: LUNG BASES: Moderate LEFT pleural effusion, similar to prior examination. Bibasilar consolidation with air bronchograms. Included heart size is normal. SOLID ORGANS: The liver demonstrates 1 cm probable cyst segment 7, otherwise unremarkable. Spleen, pancreas and gallbladder are normal. Thickened adrenal glands can be seen with hyperplasia without nodule. Spleen, gallbladder, pancreas and adrenal glands are unremarkable. GASTROINTESTINAL TRACT: Status post diverting RIGHT lower quadrant colostomy with contrast in the bag. Fluid-filled Hartmann's pouch. Focal small bowel wall thickening in LEFT upper quadrant which is unchanged, apposed to the intraperitoneal abscess. Normal appendix. KIDNEYS/ URINARY TRACT: Kidneys are orthotopic, demonstrating symmetric enhancement. No nephrolithiasis, hydronephrosis or solid renal masses. The unopacified ureters are normal in course and caliber. Delayed imaging through the kidneys demonstrates  symmetric prompt contrast excretion within the proximal urinary collecting system. Urinary bladder is well distended and unremarkable. PERITONEUM/RETROPERITONEUM: Contiguous with the colonic stump is a fluid collection with increasing fluid fluid level, LEFT lower quadrant pigtail drainage catheter  along the lateral margin of the fluid collection. Large issue shaped component is 8.7 x 4 cm, previously 10.3 x 3.5 cm. There multiple smaller fluid collections the LEFT upper quadrant, contiguous with the greater curvature of the stomach and, the subdiaphragmatic recesses, which measures 6.8 x 4.3 cm, previously 8 x 4.4 cm. 5.1 x 2.9 cm fluid collection contiguous with the lateral margin LEFT psoas muscle was 5.2 x 2.4 cm. Aortoiliac vessels are normal in course and caliber, trace calcific atherosclerosis. No lymphadenopathy by CT size criteria. Prostate size is normal. SOFT TISSUE/OSSEOUS STRUCTURES: Non-suspicious. Small fat containing inguinal hernias. IMPRESSION: Similar size of multi loculated LEFT pelvic and abdominal abscesses with stable position of pigtail drainage catheter. Though the collections are similar in size, there is a larger fluid component, and decreased air component. LEFT upper abdominal abscess do not appear to communicate. 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 Stable small bowel wall thickening LEFT upper quadrant abutting the abscess. No bowel obstruction. Persistent partially imaged LEFT pleural effusion and bibasilar atelectasis or possible pneumonia. Electronically Signed   By: Elon Alas M.D.   On: 02/22/2015 03:59    Labs:  CBC:  Recent Labs  02/14/15 0938 02/21/15 2313 02/23/15 0922 02/24/15 0528  WBC 11.5* 9.9 7.1 10.4  HGB 12.2* 11.3* 11.1* 11.0*  HCT 37.7* 34.3* 34.2* 34.6*  PLT 430* 451* 497* 416*    COAGS:  Recent Labs  02/16/15 1202 02/21/15 2313 02/23/15 0922 02/24/15 0528  INR 2.1 3.42* 3.41* 2.27*    BMP:  Recent Labs  02/01/15 0520 02/14/15 0938 02/21/15 2313 02/23/15 0922  NA 138 136 138 139  K 3.9 4.2 4.2 4.2  CL 91* 94* 100* 101  CO2 41* 29 26 27   GLUCOSE 121* 134* 115* 155*  BUN 9 9 12 9   CALCIUM 8.1* 8.9 8.5* 8.8*  CREATININE 0.75 0.85 0.85 0.76  GFRNONAA >60 >60  >60 >60  GFRAA >60 >60 >60 >60    LIVER FUNCTION TESTS:  Recent Labs  01/14/15 0906 01/19/15 0445 02/14/15 0938 02/21/15 2313  BILITOT 1.2 1.1 0.4 0.4  AST 22 47* 22 30  ALT 21 26 40 42  ALKPHOS 85 67 68 57  PROT 6.7 4.9* 7.0 6.6  ALBUMIN 2.6* 1.4* 2.2* 2.0*    Assessment and Plan:  Left abd abscess drain intact Home today I have rescheduled appt at IR drain clinic  He will hear from scheduler for new time and date  Electronically Signed: Madelynn Malson A 02/24/2015, 1:58 PM   I spent a total of 15 Minutes at the the patient's bedside AND on the patient's hospital floor or unit, greater than 50% of which was counseling/coordinating care for left abd abscess drain

## 2015-02-25 ENCOUNTER — Other Ambulatory Visit: Payer: Self-pay | Admitting: *Deleted

## 2015-02-25 MED ORDER — DILTIAZEM HCL ER COATED BEADS 120 MG PO CP24: 120.0000 mg | ORAL_CAPSULE | Freq: Two times a day (BID) | ORAL | Status: DC

## 2015-02-25 MED ORDER — DIGOXIN 125 MCG PO TABS: 0.1250 mg | ORAL_TABLET | Freq: Every day | ORAL | Status: DC

## 2015-02-25 MED ORDER — LISINOPRIL 5 MG PO TABS: 5.0000 mg | ORAL_TABLET | Freq: Every day | ORAL | Status: DC

## 2015-02-26 ENCOUNTER — Other Ambulatory Visit: Payer: Medicaid Other

## 2015-02-27 ENCOUNTER — Ambulatory Visit (INDEPENDENT_AMBULATORY_CARE_PROVIDER_SITE_OTHER): Payer: Medicaid Other | Admitting: *Deleted

## 2015-02-27 ENCOUNTER — Telehealth: Payer: Self-pay | Admitting: Internal Medicine

## 2015-02-27 DIAGNOSIS — I482 Chronic atrial fibrillation, unspecified: Secondary | ICD-10-CM

## 2015-02-27 DIAGNOSIS — I4891 Unspecified atrial fibrillation: Secondary | ICD-10-CM | POA: Diagnosis not present

## 2015-02-27 DIAGNOSIS — Z7901 Long term (current) use of anticoagulants: Secondary | ICD-10-CM | POA: Diagnosis not present

## 2015-02-27 DIAGNOSIS — Z5181 Encounter for therapeutic drug level monitoring: Secondary | ICD-10-CM

## 2015-02-27 LAB — CULTURE, BLOOD (ROUTINE X 2): CULTURE: NO GROWTH

## 2015-02-27 LAB — POCT INR: INR: 1.9

## 2015-02-27 NOTE — Telephone Encounter (Signed)
Called pt and left message informing him that he has refill of his Lisinopril at his pharmacy and that he just has to call his pharmacy to request a refill.

## 2015-03-02 ENCOUNTER — Ambulatory Visit (HOSPITAL_COMMUNITY)
Admission: RE | Admit: 2015-03-02 | Discharge: 2015-03-02 | Disposition: A | Discharge status: Home / Self Care | Payer: Medicaid Other | Source: Ambulatory Visit | Attending: Diagnostic Radiology | Admitting: Diagnostic Radiology

## 2015-03-02 ENCOUNTER — Other Ambulatory Visit: Payer: Self-pay | Admitting: Diagnostic Radiology

## 2015-03-02 ENCOUNTER — Other Ambulatory Visit (HOSPITAL_COMMUNITY): Payer: Self-pay | Admitting: Interventional Radiology

## 2015-03-02 DIAGNOSIS — Z4803 Encounter for change or removal of drains: Secondary | ICD-10-CM | POA: Diagnosis present

## 2015-03-02 DIAGNOSIS — K631 Perforation of intestine (nontraumatic): Secondary | ICD-10-CM

## 2015-03-02 DIAGNOSIS — Z7901 Long term (current) use of anticoagulants: Secondary | ICD-10-CM | POA: Insufficient documentation

## 2015-03-02 DIAGNOSIS — K651 Peritoneal abscess: Secondary | ICD-10-CM

## 2015-03-02 MED ORDER — LIDOCAINE HCL 1 % IJ SOLN
INTRAMUSCULAR | Status: AC
  Filled 2015-03-02: qty 20

## 2015-03-02 NOTE — Procedures (Signed)
Patient presented with leakage around the left lower quadrant drain.  Drain has been pulled back.  The drain was removed over a wire and new 14 French drain placed.  60 ml of foul smelling yellow fluid was removed.  The drain site was sutured with Prolene.  No immediate complication.  See full procedure report in Imaging.

## 2015-03-02 NOTE — Progress Notes (Signed)
Patient ID: William Fischer, male   DOB: 1965-10-21, 50 y.o.   MRN: [id number]    Referring Physician(s): [place]  Chief Complaint: Leaking around drain site  Subjective: This patient is known to [place] for a drain that was placed on 2/2 for a left abdominal abscess.  He states that for the last 1-2 weeks the suture has been pulled through and just 4 days ago it started leaking.  He states every time he stands up, it pours out material that is typically in his bag.  He is brought in today for evaluation.  Allergies: Contrast media  Medications: Prior to Admission medications   Medication Sig Start Date End Date Taking? Authorizing Provider  acetaminophen (TYLENOL) 500 MG tablet Take 500-1,000 mg by mouth every 6 (six) hours as needed for mild pain or headache.    Historical Provider, MD  ciprofloxacin (CIPRO) 500 MG tablet Take 1 tablet (500 mg total) by mouth 2 (two) times daily. 02/24/15   Emina Riebock, NP  digoxin (LANOXIN) 0.125 MG tablet Take 1 tablet (0.125 mg total) by mouth daily. 02/25/15   Thompson Grayer, MD  diltiazem (CARDIZEM CD) 120 MG 24 hr capsule Take 1 capsule (120 mg total) by mouth 2 (two) times daily. 02/25/15   Thompson Grayer, MD  feeding supplement (BOOST / RESOURCE BREEZE) LIQD Take 1 Container by mouth 3 (three) times daily between meals. 02/02/15   Liberty Handy, MD  furosemide (LASIX) 40 MG tablet Take 40 mg by mouth daily.    Historical Provider, MD  lisinopril (PRINIVIL,ZESTRIL) 5 MG tablet Take 1 tablet (5 mg total) by mouth daily. 02/25/15   Thompson Grayer, MD  Metoprolol Tartrate 75 MG TABS Take 150 mg by mouth 2 (two) times daily. 02/02/15   Liberty Handy, MD  oxyCODONE (OXY IR/ROXICODONE) 5 MG immediate release tablet Take 1-2 tablets (5-10 mg total) by mouth every 4 (four) hours as needed for moderate pain. 02/24/15   Erby Pian, NP  warfarin (COUMADIN) 5 MG tablet Take 1 tablet (5 mg total) by mouth daily. Take as directed by the Coumadin clinic. Take 1/2 tablet tonight  2/7.  Then resume your normal dose.  INR in 1 week 02/24/15   Erby Pian, NP    Vital Signs: There were no vitals taken for this visit.  Physical Exam: Abd: wound VAC in place as well as right side colostomy.  Left sided drain with some purulent drainage in the gravity bag.  Drain has clearly pulled out as the sutures are a good 10cm from the abdominal wall.  It does flush with 20cc of NS and aspirate.  A small amount of fluid does leak around the drain while laying down.  There is erythema surrounding the drain site.  Imaging: No results found.  Labs:  CBC:  Recent Labs  02/14/15 0938 02/21/15 2313 02/23/15 0922 02/24/15 0528  WBC 11.5* 9.9 7.1 10.4  HGB 12.2* 11.3* 11.1* 11.0*  HCT 37.7* 34.3* 34.2* 34.6*  PLT 430* 451* 497* 416*    COAGS:  Recent Labs  02/21/15 2313 02/23/15 0922 02/24/15 0528 02/27/15 1033  INR 3.42* 3.41* 2.27* 1.9    BMP:  Recent Labs  02/01/15 0520 02/14/15 0938 02/21/15 2313 02/23/15 0922  NA 138 136 138 139  K 3.9 4.2 4.2 4.2  CL 91* 94* 100* 101  CO2 41* 29 26 27   GLUCOSE 121* 134* 115* 155*  BUN 9 9 12 9   CALCIUM 8.1* 8.9 8.5* 8.8*  CREATININE 0.75 0.85 0.85  0.76  GFRNONAA >60 >60 >60 >60  GFRAA >60 >60 >60 >60    LIVER FUNCTION TESTS:  Recent Labs  01/14/15 0906 01/19/15 0445 02/14/15 0938 02/21/15 2313  BILITOT 1.2 1.1 0.4 0.4  AST 22 47* 22 30  ALT 21 26 40 42  ALKPHOS 85 67 68 57  PROT 6.7 4.9* 7.0 6.6  ALBUMIN 2.6* 1.4* 2.2* 2.0*    Assessment and Plan: 1. S/p drain placement in left abdominal abscess on 2/2 -the drain appears to likely be malpositioned given how far the drain has come out along with the leakage. -we will likely plan for an upsize of the drain as well as repositioning today by Dr. Anselm Pancoast. -he has not eaten today, and only had a couple of small sips of sprite.  Electronically Signed: Henreitta Cea 03/02/2015, 2:10 PM   I spent a total of 15 Minutes at the the patient's bedside AND on  the patient's hospital floor or unit, greater than 50% of which was counseling/coordinating care for malfunctioning drain, left abdominal abscess, s/p perc drain

## 2015-03-03 ENCOUNTER — Ambulatory Visit (INDEPENDENT_AMBULATORY_CARE_PROVIDER_SITE_OTHER): Payer: Medicaid Other | Admitting: Interventional Cardiology

## 2015-03-03 ENCOUNTER — Other Ambulatory Visit (HOSPITAL_COMMUNITY): Payer: Self-pay | Admitting: Interventional Radiology

## 2015-03-03 ENCOUNTER — Ambulatory Visit (HOSPITAL_COMMUNITY)
Admission: RE | Admit: 2015-03-03 | Discharge: 2015-03-03 | Disposition: A | Discharge status: Home / Self Care | Payer: Medicaid Other | Source: Ambulatory Visit | Attending: Interventional Radiology | Admitting: Interventional Radiology

## 2015-03-03 DIAGNOSIS — Z4682 Encounter for fitting and adjustment of non-vascular catheter: Secondary | ICD-10-CM | POA: Insufficient documentation

## 2015-03-03 DIAGNOSIS — K651 Peritoneal abscess: Secondary | ICD-10-CM

## 2015-03-03 DIAGNOSIS — T814XXD Infection following a procedure, subsequent encounter: Secondary | ICD-10-CM | POA: Diagnosis not present

## 2015-03-03 DIAGNOSIS — Z5181 Encounter for therapeutic drug level monitoring: Secondary | ICD-10-CM

## 2015-03-03 DIAGNOSIS — Z7901 Long term (current) use of anticoagulants: Secondary | ICD-10-CM

## 2015-03-03 DIAGNOSIS — I482 Chronic atrial fibrillation, unspecified: Secondary | ICD-10-CM

## 2015-03-03 DIAGNOSIS — I4891 Unspecified atrial fibrillation: Secondary | ICD-10-CM

## 2015-03-03 LAB — POCT INR: INR: 2

## 2015-03-03 MED ORDER — IOHEXOL 300 MG/ML  SOLN
50.0000 mL | Freq: Once | INTRAMUSCULAR | Status: AC | PRN
  Administered 2015-03-03: 5 mL via INTRAVENOUS

## 2015-03-03 MED ORDER — LIDOCAINE-EPINEPHRINE (PF) 1 %-1:200000 IJ SOLN
INTRAMUSCULAR | Status: AC
  Filled 2015-03-03: qty 30

## 2015-03-03 MED ORDER — MIDAZOLAM HCL 2 MG/2ML IJ SOLN
INTRAMUSCULAR | Status: AC | PRN
  Administered 2015-03-03 (×2): 1 mg via INTRAVENOUS

## 2015-03-03 MED ORDER — FENTANYL CITRATE (PF) 100 MCG/2ML IJ SOLN
INTRAMUSCULAR | Status: AC | PRN
  Administered 2015-03-03 (×2): 50 ug via INTRAVENOUS

## 2015-03-03 MED ORDER — FENTANYL CITRATE (PF) 100 MCG/2ML IJ SOLN
INTRAMUSCULAR | Status: AC
  Filled 2015-03-03: qty 2

## 2015-03-03 MED ORDER — MIDAZOLAM HCL 2 MG/2ML IJ SOLN
INTRAMUSCULAR | Status: AC
  Filled 2015-03-03: qty 2

## 2015-03-03 NOTE — Sedation Documentation (Signed)
Patient c/o pain in left lower quadrant

## 2015-03-03 NOTE — Sedation Documentation (Signed)
Patient c/o pain in Left lower quadrant

## 2015-03-03 NOTE — Sedation Documentation (Signed)
Pt anxious regarding procedure. Pt mother in waiting room at this time will transport pt home. No complaints of pain from pt at this time, vital signs stable.

## 2015-03-03 NOTE — Discharge Instructions (Signed)
Percutaneous Abscess Drain An abscess is a collection of infected fluid inside the body. Your health care provider may decide to remove or drain the infected fluid from the area by placing a thin needle into the abscess. Usually, a small tube is left in place to drain the abscess fluid. The abscess fluid may take a few days to drain. LET [facility] CARE PROVIDER KNOW ABOUT:  Any allergies you have.  All medicines you are taking, including vitamins, herbs, eye drops, creams, and over-the-counter medicines. This includes steroid medicines by mouth or cream.  Previous problems you or members of your family have had with the use of anesthetics.  Any blood disorders you have.  Previous surgeries you have had.  Possibility of pregnancy, if this applies.  Medical conditions you have.  Any history of smoking. RISKS AND COMPLICATIONS Generally, this is a safe procedure. However, problems can occur and include:   Infection.  Allergic reaction to materials used (such as contrast dye).  Damage to a nearby organ or tissue.  Bleeding.  Blockage of a tube placed to drain the abscess, requiring placement of a new drainage tube.  A need to repeat the procedure.  Failure of the procedure to adequately drain the abscess, requiring an open surgical procedure to do so. BEFORE THE PROCEDURE   Ask your health care provider about:  Changing or stopping your regular medicines. This is especially important if you are taking diabetes medicines or blood thinners.  Taking medicines such as aspirin and ibuprofen. These medicines can thin your blood. Do not take these medicines before your procedure if your health care provider asks you not to.  Your health care provider may do some blood or urine tests. These will help your health care provider learn how well your kidneys and liver are working and how well your blood clots.  Do not eat or drink anything after midnight on the night before the  procedure or as directed by your health care provider.  Make arrangements for someone to drive you home after the procedure.  PROCEDURE   An IV tube will be placed in your arm. Medicine will be able to flow directly into your body through this tube.  You will lie on an X-ray table.  Your heart rate, blood pressure, and breathing will be monitored.   Your oxygen level will also be watched during the procedure. Supplemental oxygen may be given if necessary.  The skin around the area where the drainage tube (catheter) will be placed will be cleaned and numbed.  A small cut (incision) will then be made to insert the drainage tube. The drainage tube will be inserted using X-ray or CT scan to help direct where it should be placed.  The drainage tube will be guided into the abscess to drain the infected fluid.  The drainage tube may stay in place and be connected to a bag outside your body. It will stay until the fluid has stopped draining and the infection is gone. AFTER THE PROCEDURE  You will be taken to a recovery area where you will stay until the medicines have worn off.  You will stay in bed for several hours.  Your progress will be monitored.   Your blood pressure and pulse will be checked often.   The area of the incision will be checked often.  You may have some pain or feel sick. Tell your health care provider.  As you begin to feel better, you may be given ice,  fluids, and food.   When you can walk, drink, eat, and use the bathroom, you may be able to go home.   This information is not intended to replace advice given to you by your health care provider. Make sure you discuss any questions you have with your health care provider.   Document Released: 05/20/2013 Document Reviewed: 05/20/2013 Elsevier Interactive Patient Education [employer].

## 2015-03-03 NOTE — Sedation Documentation (Signed)
Patient is resting comfortably. No complaints of pain at this time, vital signs stable.

## 2015-03-03 NOTE — Procedures (Signed)
Technically successful fluoro guided conversion of existing 14 Fr drainage catheter to a 14 Fr biliary drainage catheter with end coiled and locked within the cranial aspect of the L RP abscess cavity  Technically successful fluoro guided placement of a new 14 Fr biliary drainage catheter with end coiled and locked within the mid aspect of the L RP abscess cavity  Technically successful fluoro guided placement of a new 14 Fr all purpose drainage catheter with end coiled and locked within the caudal aspect of the L RP abscess cavity.  A total 150 cc of purulent, foul smelling material aspirated from all 3 drains following conversion/placement.    EBL: None No immediate post procedural complications.   Ronny Bacon, MD Pager #: [phone]

## 2015-03-03 NOTE — Sedation Documentation (Signed)
Patient is resting comfortably. No complaints of pain at this time. 

## 2015-03-06 ENCOUNTER — Telehealth: Payer: Self-pay | Admitting: Internal Medicine

## 2015-03-06 MED ORDER — DILTIAZEM HCL ER COATED BEADS 120 MG PO CP24: 120.0000 mg | ORAL_CAPSULE | Freq: Every day | ORAL | Status: DC

## 2015-03-06 NOTE — Telephone Encounter (Signed)
Talked with Seth Bake - home health nurse assigned to patient - over the last week since being home from hospital she has noticed trend down of HR/BP. HR is trending in 60s (had been 90s) and SBP 100-110. Pt is not symptomatic with lower BP at this time.  (Was recently in hospital for infection) Discussed with Roderic Palau NP will decrease cardizem to 120mg  once a day. RN verbalized understanding and will be in contact with patient with instructions. Patient will call back if further issues with BP after medication reduction.

## 2015-03-06 NOTE — Telephone Encounter (Signed)
Pt blood pressure is running low,it is 100/54. Please call to advise.His Metoprolol and Diltiazem was increased.

## 2015-03-06 NOTE — Addendum Note (Signed)
Addended by: Juluis Mire on: 03/06/2015 11:02 AM   Modules accepted: Orders

## 2015-03-09 ENCOUNTER — Other Ambulatory Visit: Payer: Self-pay | Admitting: Internal Medicine

## 2015-03-09 ENCOUNTER — Ambulatory Visit (INDEPENDENT_AMBULATORY_CARE_PROVIDER_SITE_OTHER): Payer: Medicaid Other | Admitting: Pharmacist

## 2015-03-09 DIAGNOSIS — Z7901 Long term (current) use of anticoagulants: Secondary | ICD-10-CM

## 2015-03-09 DIAGNOSIS — I4891 Unspecified atrial fibrillation: Secondary | ICD-10-CM

## 2015-03-09 DIAGNOSIS — I482 Chronic atrial fibrillation, unspecified: Secondary | ICD-10-CM

## 2015-03-09 DIAGNOSIS — Z5181 Encounter for therapeutic drug level monitoring: Secondary | ICD-10-CM

## 2015-03-09 LAB — POCT INR: INR: 3.7

## 2015-03-11 ENCOUNTER — Other Ambulatory Visit: Payer: Self-pay | Admitting: Internal Medicine

## 2015-03-12 ENCOUNTER — Ambulatory Visit
Admit: 2015-03-12 | Discharge: 2015-03-12 | Disposition: A | Discharge status: Home / Self Care | Payer: Medicaid Other | Attending: General Surgery | Admitting: General Surgery

## 2015-03-12 ENCOUNTER — Ambulatory Visit
Admit: 2015-03-12 | Discharge: 2015-03-12 | Disposition: A | Discharge status: Home / Self Care | Payer: Medicaid Other | Attending: Radiology | Admitting: Radiology

## 2015-03-12 ENCOUNTER — Other Ambulatory Visit (HOSPITAL_COMMUNITY): Payer: Self-pay | Admitting: *Deleted

## 2015-03-12 DIAGNOSIS — K651 Peritoneal abscess: Secondary | ICD-10-CM

## 2015-03-12 DIAGNOSIS — K631 Perforation of intestine (nontraumatic): Secondary | ICD-10-CM

## 2015-03-12 MED ORDER — METOPROLOL TARTRATE 75 MG PO TABS: 150.0000 mg | ORAL_TABLET | Freq: Two times a day (BID) | ORAL | Status: DC

## 2015-03-12 MED ORDER — IOPAMIDOL (ISOVUE-300) INJECTION 61%
125.0000 mL | Freq: Once | INTRAVENOUS | Status: AC | PRN
  Administered 2015-03-12: 125 mL via INTRAVENOUS

## 2015-03-13 ENCOUNTER — Other Ambulatory Visit: Payer: Self-pay | Admitting: General Surgery

## 2015-03-16 ENCOUNTER — Ambulatory Visit (INDEPENDENT_AMBULATORY_CARE_PROVIDER_SITE_OTHER): Payer: Medicaid Other | Admitting: Pharmacist

## 2015-03-16 DIAGNOSIS — Z7901 Long term (current) use of anticoagulants: Secondary | ICD-10-CM

## 2015-03-16 DIAGNOSIS — I482 Chronic atrial fibrillation, unspecified: Secondary | ICD-10-CM

## 2015-03-16 DIAGNOSIS — I4891 Unspecified atrial fibrillation: Secondary | ICD-10-CM

## 2015-03-16 DIAGNOSIS — Z5181 Encounter for therapeutic drug level monitoring: Secondary | ICD-10-CM

## 2015-03-16 LAB — POCT INR: INR: 1.8

## 2015-03-17 ENCOUNTER — Other Ambulatory Visit: Payer: Self-pay | Admitting: Radiology

## 2015-03-17 DIAGNOSIS — K651 Peritoneal abscess: Principal | ICD-10-CM

## 2015-03-17 DIAGNOSIS — T814XXS Infection following a procedure, sequela: Principal | ICD-10-CM

## 2015-03-17 DIAGNOSIS — IMO0001 Reserved for inherently not codable concepts without codable children: Secondary | ICD-10-CM

## 2015-03-17 MED ORDER — DIPHENHYDRAMINE HCL 25 MG PO CAPS: 50.0000 mg | ORAL_CAPSULE | Freq: Once | ORAL | Status: DC

## 2015-03-17 MED ORDER — PREDNISONE 50 MG PO TABS: ORAL_TABLET | ORAL | Status: DC

## 2015-03-23 ENCOUNTER — Encounter (HOSPITAL_COMMUNITY): Payer: Self-pay | Admitting: Nurse Practitioner

## 2015-03-23 ENCOUNTER — Ambulatory Visit (HOSPITAL_COMMUNITY)
Admission: RE | Admit: 2015-03-23 | Discharge: 2015-03-23 | Disposition: A | Discharge status: Home / Self Care | Payer: Medicaid Other | Source: Ambulatory Visit | Attending: Nurse Practitioner | Admitting: Nurse Practitioner

## 2015-03-23 VITALS — BP 88/58 | HR 82 | Ht 70.0 in | Wt 251.4 lb

## 2015-03-23 DIAGNOSIS — Z79899 Other long term (current) drug therapy: Secondary | ICD-10-CM | POA: Insufficient documentation

## 2015-03-23 DIAGNOSIS — Z833 Family history of diabetes mellitus: Secondary | ICD-10-CM | POA: Insufficient documentation

## 2015-03-23 DIAGNOSIS — Z87891 Personal history of nicotine dependence: Secondary | ICD-10-CM | POA: Diagnosis not present

## 2015-03-23 DIAGNOSIS — I5022 Chronic systolic (congestive) heart failure: Secondary | ICD-10-CM | POA: Insufficient documentation

## 2015-03-23 DIAGNOSIS — I959 Hypotension, unspecified: Secondary | ICD-10-CM | POA: Diagnosis not present

## 2015-03-23 DIAGNOSIS — E669 Obesity, unspecified: Secondary | ICD-10-CM | POA: Insufficient documentation

## 2015-03-23 DIAGNOSIS — Z91041 Radiographic dye allergy status: Secondary | ICD-10-CM | POA: Diagnosis not present

## 2015-03-23 DIAGNOSIS — R200 Anesthesia of skin: Secondary | ICD-10-CM | POA: Insufficient documentation

## 2015-03-23 DIAGNOSIS — Z8249 Family history of ischemic heart disease and other diseases of the circulatory system: Secondary | ICD-10-CM | POA: Diagnosis not present

## 2015-03-23 DIAGNOSIS — Z9049 Acquired absence of other specified parts of digestive tract: Secondary | ICD-10-CM | POA: Insufficient documentation

## 2015-03-23 DIAGNOSIS — I429 Cardiomyopathy, unspecified: Secondary | ICD-10-CM | POA: Insufficient documentation

## 2015-03-23 DIAGNOSIS — I482 Chronic atrial fibrillation, unspecified: Secondary | ICD-10-CM

## 2015-03-23 DIAGNOSIS — Z933 Colostomy status: Secondary | ICD-10-CM | POA: Diagnosis not present

## 2015-03-23 DIAGNOSIS — Z7902 Long term (current) use of antithrombotics/antiplatelets: Secondary | ICD-10-CM | POA: Diagnosis not present

## 2015-03-23 DIAGNOSIS — I4891 Unspecified atrial fibrillation: Secondary | ICD-10-CM | POA: Diagnosis present

## 2015-03-23 MED ORDER — DILTIAZEM HCL ER COATED BEADS 120 MG PO CP24
120.0000 mg | ORAL_CAPSULE | Freq: Every day | ORAL | Status: DC
Start: 2015-03-23 — End: 2015-04-21

## 2015-03-23 MED ORDER — METOPROLOL TARTRATE 100 MG PO TABS: 100.0000 mg | ORAL_TABLET | Freq: Two times a day (BID) | ORAL | Status: DC

## 2015-03-23 MED ORDER — FUROSEMIDE 40 MG PO TABS: 40.0000 mg | ORAL_TABLET | Freq: Every day | ORAL | Status: DC

## 2015-03-23 MED ORDER — DIGOXIN 125 MCG PO TABS: 0.1250 mg | ORAL_TABLET | Freq: Every day | ORAL | Status: DC

## 2015-03-23 NOTE — Patient Instructions (Signed)
Your physician has recommended you make the following change in your medication:  1)Decrease metoprolol to 100mg  twice a day

## 2015-03-23 NOTE — Progress Notes (Signed)
Patient ID: William Fischer, male   DOB: 1966-01-03, 50 y.o.   MRN: [id number] .a Patient ID: William Fischer, male   DOB: 1965-06-24, 50 y.o.   MRN: [id number]     Primary Care Physician: No PCP Per Patient Referring Physician: [facility] f/u Electrophysiologist: Dr. Dianna Limbo is a 50 y.o. 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 He reports that he is slowly recovering his strength and he has not noticed any irregular heart beat but afib was poorly controlled at 115 bpm on initial visit in afib clinic. Cardizem was increased to 120 mg bid. He was referred to coumadin clinic for transition over to warfarin since an ischemic event is what was thought to be etiology of surgical issue. He currently is on lovenox and will f/u with coumadin clinic after this visit today. He did have an ER visit last Friday for fever and is on cipro. His heart rate does appear to be better controlled today and at home reading of BP/HR have been acceptable with HR controlled between 78-96 bpm.  He f/u's int the afib clinic today and is frustrated because he continues to feel his recovery is very slow. He continues to have drains collecting a  purulent discharge, on cipro and at times will break out in sweats at home. He continues to lose weight. For the most part he feels like he is freezing all the time. He feels dizzy a lot and his BP today is low. Afib is better controlled and [facility] called in 2/17 and reported hypotension and Cardizem dose was decreased to 120 mg daily. Will try to reduce metoprolol today. The dizziness may be from hypotension, He has frequent H/A's. He has slept in a recliner for months and reports will make up with left arm numbness  that will improve when he sits up and changes his position  in a recliner. He does not notice any other neurological deficits when l arm is numb. [place] nurse is in the home 2-3 times a week. He does not have a medical doctor. Only MD in the past was  Dr. Rayann Heman for his afib. His afib has appeared stable but has  He several comorbidites that would be best addressed by internal medicine. Coumadin being followed by Coumadin clinic [address] location.  Today, he denies symptoms of  orthopnea, PND, lower extremity edema, , syncope, or neurologic sequela Positive for  dizziness, fatigue, chronic drainage, hypotension, headaches. The patient is tolerating medications without difficulties and is otherwise without complaint today.   Past Medical History  Diagnosis Date  . Chicken pox as a child  . Migraine 06/29/2011  . Obesity 06/29/2011  . Depression with anxiety 06/29/2011  . Atrial fibrillation ([place])     admx 11/13 with acute sCHF in setting of RVR  => a. failed DCCV x 2; b. Pradaxa started;  c. failed sotalol  . Chronic systolic heart failure ([place])     a. echo 11/13: Ef 40-45%, diff HK, mod MR, mod LAE, mild RVE, mod RAE, small effusion;   b. TEE 11/13:  EF 35-40%, no LAA clot; Echo 2/14 shows normal EF  . 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 Snoring  patient needs sleep study - has declined  . Allergy to IVP dye   . Chronic anticoagulation     Pradaxa   Past Surgical History  Procedure Laterality Date  . Tee without cardioversion  11/30/2011    Procedure: TRANSESOPHAGEAL ECHOCARDIOGRAM (TEE);  Surgeon: Thayer Headings, MD;  Location: [place];  Service: Cardiovascular;  Laterality: N/A;  . Cardioversion  11/30/2011    Procedure: CARDIOVERSION;  Surgeon: Thayer Headings, MD;  Location: [place];  Service: Cardiovascular;  Laterality: N/A;  . Cardioversion  12/03/2011    Procedure: CARDIOVERSION;  Surgeon: Jolaine Artist, MD;  Location:  [place];  Service: Cardiovascular;  Laterality: N/A;  . Laparotomy N/A 01/17/2015    Procedure: EXPLORATORY LAPAROTOMY WITH LEFT COLECTOMY AND COLOSTOMY;  Surgeon: Rolm Bookbinder, MD;  Location: [place];  Service: General;  Laterality: N/A;    Current Outpatient Prescriptions  Medication Sig Dispense Refill  . acetaminophen (TYLENOL) 500 MG tablet Take 500-1,000 mg by mouth every 6 (six) hours as needed for mild pain or headache.    . ciprofloxacin (CIPRO) 500 MG tablet Take 1 tablet (500 mg total) by mouth 2 (two) times daily. 28 tablet 0  . digoxin (LANOXIN) 0.125 MG tablet Take 1 tablet (0.125 mg total) by mouth daily. 30 tablet 11  . diltiazem (CARDIZEM CD) 120 MG 24 hr capsule Take 1 capsule (120 mg total) by mouth daily. 60 capsule 11  . diphenhydrAMINE (BENADRYL) 25 mg capsule Take 2 capsules (50 mg total) by mouth once. 2 capsule 0  . feeding supplement (BOOST / RESOURCE BREEZE) LIQD Take 1 Container by mouth 3 (three) times daily between meals. 12 Container 0  . furosemide (LASIX) 40 MG tablet Take 1 tablet (40 mg total) by mouth daily. 30 tablet 6  . lisinopril (PRINIVIL,ZESTRIL) 5 MG tablet Take 1 tablet (5 mg total) by mouth daily. 30 tablet 11  . oxyCODONE (OXY IR/ROXICODONE) 5 MG immediate release tablet Take 1-2 tablets (5-10 mg total) by mouth every 4 (four) hours as needed for moderate pain. 40 tablet 0  . warfarin (COUMADIN) 5 MG tablet Take as directed by Coumadin Clinic 30 tablet 1  . metoprolol (LOPRESSOR) 100 MG tablet Take 1 tablet (100 mg total) by mouth 2 (two) times daily. 60 tablet 6   No current facility-administered medications for this encounter.    Allergies  Allergen Reactions  . Contrast Media [Iodinated Diagnostic Agents] Rash    a diffuse macular rash after CTA chest  Pt was premedicated with 125mg  IV Solumedrol, 50mg  IV Benadryl 1 hr prior to CTexam, and tolerated procedure without any difficulties. 11/28/11 Also same pre med protocol observed on 02/22/15  and pt tolerated the procedure well    Social History   Social History  . Marital Status: Divorced    Spouse Name: N/A  . Number of Children: N/A  . Years of Education: N/A   Occupational History  . Not on file.   Social History Main Topics  . Smoking status: Former Smoker -- 1.00 packs/day for 20 years    Types: Cigarettes    Quit date: 07/10/2014  . Smokeless tobacco: Never Used  . Alcohol Use: No  . Drug Use: No  . Sexual Activity: No   Other Topics Concern  . Not on file   Social History Narrative    Family History  Problem Relation Age of Onset  . Hypertension Mother   . Hypertension Father   . Aneurysm Father   . Alcohol  abuse Father   . Cancer Maternal Grandmother     brain  . Diabetes Maternal Grandfather     type 2  . Parkinsonism Maternal Grandfather   . Cancer Paternal Grandmother     breast  . Diabetes Paternal Grandmother   . Alcohol abuse Paternal Grandfather     ROS- All systems are reviewed and negative except as per the HPI above  Physical Exam: Filed Vitals:   03/23/15 1014  BP: 88/58  Pulse: 82  Height: 5\' 10"  (1.778 m)  Weight: 251 lb 6.4 oz (114.034 kg)    GEN- The patient is well appearing, alert and oriented x 3 today.   Head- normocephalic, atraumatic Eyes-  Sclera clear, conjunctiva pink Ears- hearing intact Oropharynx- clear Neck- supple, no JVP Lymph- no cervical lymphadenopathy Lungs- Clear to ausculation bilaterally, normal work of breathing Heart- Regular rate and rhythm, no murmurs, rubs or gallops, PMI not laterally displaced GI- soft, NT, ND, + BS Extremities- no clubbing, cyanosis, or edema MS- no significant deformity or atrophy Skin- no rash or lesion Psych- euthymic mood, full affect Neuro- strength and sensation are intact  EKG-Afib with  V rate of 101 bpm,QRS int 84 ms, QTc 430 ms.  Epic records reviewed   Assessment and Plan: 1. Chronic afib   Rate controlled Continue f/u with coumadin clinic     2. Abscess perforation s/p colectomy/colostomy On cipro since 1/28(ED visit) with recent fever F/u with surgeon as scheduled Home health nurse is in the home 3x q week    3. Hypotension  Reduce metoprolol to 100 mg bid from 150 mg bid Watch to make sure that HR stays controlled  4. Intermittent Left arm numbness without other neurological sequela.  I suspect may be positional with sleeping in a recliner for months now. Can further discuss with internal medicine.  Scheduled to get established with Internal Medicine this Friday at 10:15 for multiple comorbities   Butch Penny C. Shaneque Merkle, [facility] [address] [place], [place] [address] [phone]   Addendum- After further in depth review of all notes, on d/c summary, Dx was diverticulitis with abscess with perf/ colectotomy/colostomy. However per surgical note, Dr. Donne Hazel, surgeon, noted that that the issue with  bowel did not appear to be diverticulitis, but left colon and splenic fixture appeared necrotic. This prompted Dr. Rayann Heman to suggest coumadin instead of pradaxa,pt was switched to levenox/heparin/ coumadin. However, his INR's were very erratic and the decision was made by internal medicine/ general cardiology to return to use of pradaxa, which he was discharged on. However, Dr. Rayann Heman still fells that pt would be better served with Coumadin to prevent future recurrent thrombo/embolic events. I have contacted the coumadin clinic and they will call the pt and schedule appointment to start coumadin. Pt made aware.

## 2015-03-25 ENCOUNTER — Ambulatory Visit (INDEPENDENT_AMBULATORY_CARE_PROVIDER_SITE_OTHER): Payer: Medicaid Other | Admitting: Cardiology

## 2015-03-25 DIAGNOSIS — Z5181 Encounter for therapeutic drug level monitoring: Secondary | ICD-10-CM

## 2015-03-25 DIAGNOSIS — Z7901 Long term (current) use of anticoagulants: Secondary | ICD-10-CM

## 2015-03-25 DIAGNOSIS — I482 Chronic atrial fibrillation, unspecified: Secondary | ICD-10-CM

## 2015-03-25 DIAGNOSIS — I4891 Unspecified atrial fibrillation: Secondary | ICD-10-CM

## 2015-03-25 LAB — POCT INR: INR: 4.7

## 2015-03-26 ENCOUNTER — Other Ambulatory Visit: Payer: Self-pay | Admitting: General Surgery

## 2015-03-26 ENCOUNTER — Ambulatory Visit
Admission: RE | Admit: 2015-03-26 | Discharge: 2015-03-26 | Disposition: A | Discharge status: Home / Self Care | Payer: Medicaid Other | Source: Ambulatory Visit | Attending: General Surgery | Admitting: General Surgery

## 2015-03-26 DIAGNOSIS — K651 Peritoneal abscess: Principal | ICD-10-CM

## 2015-03-26 DIAGNOSIS — IMO0001 Reserved for inherently not codable concepts without codable children: Secondary | ICD-10-CM

## 2015-03-26 DIAGNOSIS — T814XXD Infection following a procedure, subsequent encounter: Principal | ICD-10-CM

## 2015-03-26 MED ORDER — IOPAMIDOL (ISOVUE-300) INJECTION 61%
125.0000 mL | Freq: Once | INTRAVENOUS | Status: AC | PRN
  Administered 2015-03-26: 125 mL via INTRAVENOUS

## 2015-03-26 NOTE — Progress Notes (Signed)
Patient ID: William Fischer, male   DOB: 07/08/1965, 50 y.o.   MRN: [id number]   Referring Physician(s): Wilson,Eric/Wakefield,Matt  Chief Complaint: The patient is seen in follow up today s/p image guided drainage of left-sided pericolic abscess  History of present illness: William Fischer is a 50 year old male, patient of Dr. 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 Follow-up imaging on 01/28/15 revealed a rectal stump blowout with subsequent pelvic abscess development. He underwent CT-guided drainage of a left pelvic abscess on 01/29/15 which revealed feculent material. Cultures grew Escherichia coli. On 03/02/15 the left abdominal drain was exchanged secondary to partial dislodgment. Due to persistent leaking around the drainage catheter the patient had subsequent conversion of the existing drain to 2 biliary drainage caths in addition to an APD on 03/03/15. He presents again today for follow-up CT and drain injection. Since his last visit the patient has been doing fair. He still complains of intermittent fevers, chest pain, headache, dyspnea, occasional cough as well as abdominal and back pain. His blood pressures and also been somewhat soft .He has had less drainage from around the catheter insertion sites since the most recent intervention. There is a purulent green/ pea-colored output from the drains. Majority of output is originating from the medially placed drain, which averages about 400 mL per day. He is flushing each drain three times daily. He remains on ciprofloxacin.   Past Medical History  Diagnosis Date  . Chicken pox as a child  . Migraine 06/29/2011  . Obesity 06/29/2011  . Depression with anxiety 06/29/2011  . Atrial fibrillation ([place])     admx 11/13 with acute sCHF in setting of RVR  => a. failed DCCV x 2; b. Pradaxa started;  c. failed sotalol  . Chronic systolic heart failure ([place])     a. echo 11/13: Ef 40-45%,  diff HK, mod MR, mod LAE, mild RVE, mod RAE, small effusion;   b. TEE 11/13:  EF 35-40%, no LAA clot; Echo 2/14 shows normal EF  . 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 Snoring     patient needs sleep study - has declined  . Allergy to IVP dye   . Chronic anticoagulation     Pradaxa    Past Surgical History  Procedure Laterality Date  . Tee without cardioversion  11/30/2011    Procedure: TRANSESOPHAGEAL ECHOCARDIOGRAM (TEE);  Surgeon: Thayer Headings, MD;  Location: [place];  Service: Cardiovascular;  Laterality: N/A;  . Cardioversion  11/30/2011    Procedure: CARDIOVERSION;  Surgeon: Thayer Headings, MD;  Location: [place];  Service: Cardiovascular;  Laterality: N/A;  . Cardioversion  12/03/2011    Procedure: CARDIOVERSION;  Surgeon: Jolaine Artist, MD;  Location: [place];  Service: Cardiovascular;  Laterality: N/A;  . Laparotomy N/A 01/17/2015    Procedure: EXPLORATORY LAPAROTOMY WITH LEFT COLECTOMY AND COLOSTOMY;  Surgeon: Rolm Bookbinder, MD;  Location: [place];  Service: General;  Laterality: N/A;    Allergies: Contrast media  Medications: Prior to Admission medications   Medication Sig Start Date End Date Taking? Authorizing Provider  acetaminophen (TYLENOL) 500 MG tablet Take 500-1,000 mg by mouth every 6 (six) hours as needed for mild pain or headache.    Historical Provider, MD  ciprofloxacin (CIPRO) 500 MG tablet Take 1 tablet (500 mg total) by mouth 2 (two) times daily. 02/24/15   Erby Pian, NP  digoxin (LANOXIN)  0.125 MG tablet Take 1 tablet (0.125 mg total) by mouth daily. 03/23/15   Sherran Needs, NP  diltiazem (CARDIZEM CD) 120 MG 24 hr capsule Take 1 capsule (120 mg total) by mouth daily. 03/23/15   Sherran Needs, NP  diphenhydrAMINE (BENADRYL) 25 mg capsule Take 2 capsules (50 mg total) by mouth once. 03/17/15   Aletta Edouard, MD  feeding supplement (BOOST / RESOURCE BREEZE) LIQD Take 1 Container by mouth 3 (three)  times daily between meals. 02/02/15   Liberty Handy, MD  furosemide (LASIX) 40 MG tablet Take 1 tablet (40 mg total) by mouth daily. 03/23/15   Sherran Needs, NP  lisinopril (PRINIVIL,ZESTRIL) 5 MG tablet Take 1 tablet (5 mg total) by mouth daily. 02/25/15   Thompson Grayer, MD  metoprolol (LOPRESSOR) 100 MG tablet Take 1 tablet (100 mg total) by mouth 2 (two) times daily. 03/23/15   Sherran Needs, NP  oxyCODONE (OXY IR/ROXICODONE) 5 MG immediate release tablet Take 1-2 tablets (5-10 mg total) by mouth every 4 (four) hours as needed for moderate pain. 02/24/15   Erby Pian, NP  warfarin (COUMADIN) 5 MG tablet Take as directed by Coumadin Clinic 03/09/15   Thompson Grayer, MD     Family History  Problem Relation Age of Onset  . Hypertension Mother   . Hypertension Father   . Aneurysm Father   . Alcohol abuse Father   . Cancer Maternal Grandmother     brain  . Diabetes Maternal Grandfather     type 2  . Parkinsonism Maternal Grandfather   . Cancer Paternal Grandmother     breast  . Diabetes Paternal Grandmother   . Alcohol abuse Paternal Grandfather     Social History   Social History  . Marital Status: Divorced    Spouse Name: N/A  . Number of Children: N/A  . Years of Education: N/A   Social History Main Topics  . Smoking status: Former Smoker -- 1.00 packs/day for 20 years    Types: Cigarettes    Quit date: 07/10/2014  . Smokeless tobacco: Never Used  . Alcohol Use: No  . Drug Use: No  . Sexual Activity: No   Other Topics Concern  . Not on file   Social History Narrative     Vital Signs: BP 100/57 mmHg  Pulse 89  Temp(Src) 97.6 F (36.4 C)  SpO2 97%  Physical Exam patient awake, alert. Left lower quadrant drains are intact with small amount of purulent material on gauze dressing. Medial drain has approximately 200 mL of purulent green fluid in bag. 2 additional drains are without significant output. Patient has intact right lower quadrant ostomy and midline wound  VAC.  Imaging: No results found.  Labs:  CBC:  Recent Labs  02/14/15 0938 02/21/15 2313 02/23/15 0922 02/24/15 0528  WBC 11.5* 9.9 7.1 10.4  HGB 12.2* 11.3* 11.1* 11.0*  HCT 37.7* 34.3* 34.2* 34.6*  PLT 430* 451* 497* 416*    COAGS:  Recent Labs  03/03/15 03/09/15 03/16/15 03/25/15  INR 2.0 3.7 1.8 4.7    BMP:  Recent Labs  02/01/15 0520 02/14/15 0938 02/21/15 2313 02/23/15 0922  NA 138 136 138 139  K 3.9 4.2 4.2 4.2  CL 91* 94* 100* 101  CO2 41* 29 26 27   GLUCOSE 121* 134* 115* 155*  BUN 9 9 12 9   CALCIUM 8.1* 8.9 8.5* 8.8*  CREATININE 0.75 0.85 0.85 0.76  GFRNONAA >60 >60 >60 >60  GFRAA >60 >60 >60 >  60    LIVER FUNCTION TESTS:  Recent Labs  01/14/15 0906 01/19/15 0445 02/14/15 0938 02/21/15 2313  BILITOT 1.2 1.1 0.4 0.4  AST 22 47* 22 30  ALT 21 26 40 42  ALKPHOS 85 67 68 57  PROT 6.7 4.9* 7.0 6.6  ALBUMIN 2.6* 1.4* 2.2* 2.0*    Assessment: Patient 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 Patient continues to have some intermittent fevers and remains on Cipro. Scheduled for follow-up with Dr. Donne Hazel on March 28. At this time plan is to maintain current drainage catheters and continue flushing regimen of normal saline. Please see separate reports in [facility] for today's CT abdomen/pelvis and drain injection study.Tentative IR follow up will be scheduled after [facility] visit.   Signed: D. Rowe Robert 03/26/2015, 11:45 AM   Please refer to Dr. Pascal Lux attestation of this note for management and plan.

## 2015-03-27 ENCOUNTER — Ambulatory Visit (INDEPENDENT_AMBULATORY_CARE_PROVIDER_SITE_OTHER): Payer: Medicaid Other | Admitting: Internal Medicine

## 2015-03-27 ENCOUNTER — Encounter: Payer: Self-pay | Admitting: Internal Medicine

## 2015-03-27 VITALS — BP 109/57 | HR 58 | Temp 97.8°F | Ht 70.0 in | Wt 251.1 lb

## 2015-03-27 DIAGNOSIS — B9689 Other specified bacterial agents as the cause of diseases classified elsewhere: Secondary | ICD-10-CM | POA: Diagnosis not present

## 2015-03-27 DIAGNOSIS — K651 Peritoneal abscess: Secondary | ICD-10-CM

## 2015-03-27 DIAGNOSIS — I482 Chronic atrial fibrillation, unspecified: Secondary | ICD-10-CM

## 2015-03-27 DIAGNOSIS — Z933 Colostomy status: Secondary | ICD-10-CM | POA: Insufficient documentation

## 2015-03-27 DIAGNOSIS — Z9689 Presence of other specified functional implants: Secondary | ICD-10-CM | POA: Diagnosis not present

## 2015-03-27 NOTE — Patient Instructions (Signed)
We will be seeing you in a month. If you have any new concerning problems that come up, please let us know immediately.   Please keep your follow up appointment with your surgeon, Dr Donne Hazel.

## 2015-03-27 NOTE — Assessment & Plan Note (Signed)
Pulse- 59 today, he is on Diltiazem ER- 120mg  daily and metop 100mg  BID, complaint with warfarin, INR- 4.7. Bp- 109/57, not dizzy.

## 2015-03-27 NOTE — Assessment & Plan Note (Signed)
HAs 3 drains in place. Saw IR yesterday, Had CT Scan done- showed that he still has three collections of fluid that are still unchanged, consideration by IR is for possible small bowel follow through considering persistent drainage. His abdomen today Has no tenderness on exam. His health colour, marked improvement in appearance than when he was on admission in the hospital. His vitals are stable. He has follow up with Surgery later this month.

## 2015-03-27 NOTE — Progress Notes (Addendum)
Patient ID: William Fischer, male   DOB: Aug 26, 1965, 50 y.o.   MRN: [id number]   Subjective:   Patient ID: William Fischer male   DOB: [birth year] 50 y.o.   MRN: [id number]  HPI: [name] is a 50 y.o. chronic atria fib, diverticulitis of the colon with perforation, with colostomy in situ, morbid obesity, depression, hx of abscess in abdominal cavity with three drains in place currently presented to our clinic to establish care. He follows with Interventional radiology and general surgery also with Cardiology. He has had 2 prolonged admissions in this year for mostly for intra-abdominal abscess, had colostomy placed.  He is here with his mother today. He says he was previously having fever over a week ago, but now that has resolved, no chills. He is still taking Ciprofloxacin, everyday. He has ben getting refills from his Psychologist, sport and exercise. His appetite is okay, but he has reduced his food intake as he is actively working on loosing weight. His colostomy bag is draining same amount, but his other drains are draining pea coloured fluid. No vomiting, no malaise.  Past Medical History  Diagnosis Date  . Chicken pox as a child  . Migraine 06/29/2011  . Obesity 06/29/2011  . Depression with anxiety 06/29/2011  . Atrial fibrillation ([place])     admx 11/13 with acute sCHF in setting of RVR  => a. failed DCCV x 2; b. Pradaxa started;  c. failed sotalol  . Chronic systolic heart failure ([place])     a. echo 11/13: Ef 40-45%, diff HK, mod MR, mod LAE, mild RVE, mod RAE, small effusion;   b. TEE 11/13:  EF 35-40%, no LAA clot; Echo 2/14 shows normal EF  . Cardiomyopathy ([place])     likely tachy mediated in setting of AF with RVR  . Snoring     patient needs sleep study - has declined  . Allergy to IVP dye   . Chronic anticoagulation     Pradaxa   Review of Systems: CONSTITUTIONAL- No Fever, is loosing weight. SKIN- No Rash, colour changes or itching. HEAD- No Headache or dizziness. Mouth/throat- No  Sorethroat RESPIRATORY- No Cough or SOB. CARDIAC- No Palpitations, or chest pain. URINARY- No Frequency, urgency, straining or dysuria. NEUROLOGIC- No Numbness, syncope, seizures or burning. [facility]- Denies depression or anxiety.  Objective:  Physical Exam: Filed Vitals:   03/27/15 1054  BP: 109/57  Pulse: 58  Temp: 97.8 F (36.6 C)  TempSrc: Oral  Height: 5\' 10"  (1.778 m)  Weight: 251 lb 1.6 oz (113.898 kg)  SpO2: 99%   GENERAL- alert, co-operative, appears as stated age, not in any distress. HEENT- Atraumatic, normocephalic, PERRL, neck supple. CARDIAC- Irregular rate, no murmurs, rubs or gallops. RESP- Moving equal volumes of air, and no wheezes or crackles. ABDOMEN- Soft, nontender, bowel sounds present, large wound vac central abdomen, colostomy right upper quadrant, also with drains - connected to 3 bags- right lower abdomen, draining pea coloured fluid. NEURO- No obvious Cr N abnormality, strenght upper and lower extremities- intact, Gait- Normal. EXTREMITIES- pulse 2+, symmetric, no pedal edema. SKIN- Warm, dry, No rash or lesion. PSYCH- Normal mood and affect, appropriate thought content and speech.  Assessment & Plan:   The patient's case and plan of care was discussed with attending physician, Dr. Evette Doffing.  Please see problem based charting for assessment and plan.

## 2015-03-30 ENCOUNTER — Other Ambulatory Visit: Payer: Self-pay | Admitting: General Surgery

## 2015-03-30 DIAGNOSIS — K651 Peritoneal abscess: Principal | ICD-10-CM

## 2015-03-30 DIAGNOSIS — T814XXA Infection following a procedure, initial encounter: Principal | ICD-10-CM

## 2015-03-30 DIAGNOSIS — IMO0001 Reserved for inherently not codable concepts without codable children: Secondary | ICD-10-CM

## 2015-03-31 NOTE — Progress Notes (Signed)
Internal Medicine Clinic Attending  Case discussed with Dr. Emokpae at the time of the visit.  We reviewed the resident's history and exam and pertinent patient test results.  I agree with the assessment, diagnosis, and plan of care documented in the resident's note.  

## 2015-04-02 ENCOUNTER — Telehealth: Payer: Self-pay | Admitting: Internal Medicine

## 2015-04-02 ENCOUNTER — Ambulatory Visit (INDEPENDENT_AMBULATORY_CARE_PROVIDER_SITE_OTHER): Payer: Medicaid Other | Admitting: Interventional Cardiology

## 2015-04-02 DIAGNOSIS — I482 Chronic atrial fibrillation, unspecified: Secondary | ICD-10-CM

## 2015-04-02 DIAGNOSIS — Z7901 Long term (current) use of anticoagulants: Secondary | ICD-10-CM

## 2015-04-02 DIAGNOSIS — I4891 Unspecified atrial fibrillation: Secondary | ICD-10-CM

## 2015-04-02 DIAGNOSIS — Z5181 Encounter for therapeutic drug level monitoring: Secondary | ICD-10-CM

## 2015-04-02 LAB — POCT INR: INR: 4.4

## 2015-04-02 NOTE — Telephone Encounter (Signed)
Nurse called concerned about BP of 100/60 HR 80's.  He does admit to occasional dizziness in the late afternoon, this is better than his decrease in mediation at last OV.  No changes at present but asked the RN to have patient call if the dizziness worsens.  He also needs to make sure he is hydrating adequately during the day.  If dizziness persist have patient call back to discuss.

## 2015-04-02 NOTE — Telephone Encounter (Signed)
New Message  Rn from [facility]- reporting a BP- 100/60. Please advise

## 2015-04-03 ENCOUNTER — Other Ambulatory Visit: Payer: Self-pay | Admitting: General Surgery

## 2015-04-03 DIAGNOSIS — K651 Peritoneal abscess: Secondary | ICD-10-CM

## 2015-04-06 ENCOUNTER — Ambulatory Visit
Admission: RE | Admit: 2015-04-06 | Discharge: 2015-04-06 | Disposition: A | Discharge status: Home / Self Care | Payer: Medicaid Other | Source: Ambulatory Visit | Attending: General Surgery | Admitting: General Surgery

## 2015-04-06 DIAGNOSIS — IMO0001 Reserved for inherently not codable concepts without codable children: Secondary | ICD-10-CM

## 2015-04-06 DIAGNOSIS — T814XXA Infection following a procedure, initial encounter: Principal | ICD-10-CM

## 2015-04-06 DIAGNOSIS — K651 Peritoneal abscess: Principal | ICD-10-CM

## 2015-04-08 ENCOUNTER — Ambulatory Visit (INDEPENDENT_AMBULATORY_CARE_PROVIDER_SITE_OTHER): Payer: Medicaid Other | Admitting: Cardiovascular Disease

## 2015-04-08 DIAGNOSIS — I482 Chronic atrial fibrillation, unspecified: Secondary | ICD-10-CM

## 2015-04-08 DIAGNOSIS — I4891 Unspecified atrial fibrillation: Secondary | ICD-10-CM

## 2015-04-08 DIAGNOSIS — Z5181 Encounter for therapeutic drug level monitoring: Secondary | ICD-10-CM

## 2015-04-08 DIAGNOSIS — Z7901 Long term (current) use of anticoagulants: Secondary | ICD-10-CM

## 2015-04-08 LAB — POCT INR: INR: 4

## 2015-04-11 ENCOUNTER — Encounter (HOSPITAL_COMMUNITY): Payer: Self-pay | Admitting: Emergency Medicine

## 2015-04-11 DIAGNOSIS — R739 Hyperglycemia, unspecified: Secondary | ICD-10-CM | POA: Diagnosis present

## 2015-04-11 DIAGNOSIS — R6521 Severe sepsis with septic shock: Secondary | ICD-10-CM | POA: Diagnosis present

## 2015-04-11 DIAGNOSIS — K6812 Psoas muscle abscess: Secondary | ICD-10-CM | POA: Diagnosis present

## 2015-04-11 DIAGNOSIS — Z933 Colostomy status: Secondary | ICD-10-CM

## 2015-04-11 DIAGNOSIS — K559 Vascular disorder of intestine, unspecified: Secondary | ICD-10-CM | POA: Diagnosis present

## 2015-04-11 DIAGNOSIS — R45851 Suicidal ideations: Secondary | ICD-10-CM | POA: Diagnosis present

## 2015-04-11 DIAGNOSIS — D649 Anemia, unspecified: Secondary | ICD-10-CM | POA: Diagnosis present

## 2015-04-11 DIAGNOSIS — I4891 Unspecified atrial fibrillation: Secondary | ICD-10-CM | POA: Diagnosis present

## 2015-04-11 DIAGNOSIS — F418 Other specified anxiety disorders: Secondary | ICD-10-CM | POA: Diagnosis present

## 2015-04-11 DIAGNOSIS — F329 Major depressive disorder, single episode, unspecified: Secondary | ICD-10-CM | POA: Diagnosis present

## 2015-04-11 DIAGNOSIS — Z7901 Long term (current) use of anticoagulants: Secondary | ICD-10-CM

## 2015-04-11 DIAGNOSIS — F419 Anxiety disorder, unspecified: Secondary | ICD-10-CM | POA: Diagnosis present

## 2015-04-11 DIAGNOSIS — R7303 Prediabetes: Secondary | ICD-10-CM | POA: Diagnosis not present

## 2015-04-11 DIAGNOSIS — I482 Chronic atrial fibrillation: Secondary | ICD-10-CM | POA: Diagnosis present

## 2015-04-11 DIAGNOSIS — K651 Peritoneal abscess: Secondary | ICD-10-CM | POA: Diagnosis present

## 2015-04-11 DIAGNOSIS — E860 Dehydration: Secondary | ICD-10-CM | POA: Diagnosis present

## 2015-04-11 DIAGNOSIS — E875 Hyperkalemia: Secondary | ICD-10-CM | POA: Diagnosis present

## 2015-04-11 DIAGNOSIS — T814XXA Infection following a procedure, initial encounter: Principal | ICD-10-CM | POA: Diagnosis present

## 2015-04-11 DIAGNOSIS — Z79899 Other long term (current) drug therapy: Secondary | ICD-10-CM

## 2015-04-11 DIAGNOSIS — Z6833 Body mass index (BMI) 33.0-33.9, adult: Secondary | ICD-10-CM

## 2015-04-11 DIAGNOSIS — K909 Intestinal malabsorption, unspecified: Secondary | ICD-10-CM | POA: Diagnosis present

## 2015-04-11 DIAGNOSIS — Z87891 Personal history of nicotine dependence: Secondary | ICD-10-CM

## 2015-04-11 DIAGNOSIS — J9690 Respiratory failure, unspecified, unspecified whether with hypoxia or hypercapnia: Secondary | ICD-10-CM | POA: Diagnosis present

## 2015-04-11 DIAGNOSIS — N170 Acute kidney failure with tubular necrosis: Secondary | ICD-10-CM | POA: Diagnosis present

## 2015-04-11 DIAGNOSIS — E861 Hypovolemia: Secondary | ICD-10-CM | POA: Diagnosis present

## 2015-04-11 DIAGNOSIS — Z9049 Acquired absence of other specified parts of digestive tract: Secondary | ICD-10-CM

## 2015-04-11 DIAGNOSIS — A419 Sepsis, unspecified organism: Secondary | ICD-10-CM | POA: Diagnosis present

## 2015-04-11 DIAGNOSIS — I5022 Chronic systolic (congestive) heart failure: Secondary | ICD-10-CM | POA: Diagnosis present

## 2015-04-11 DIAGNOSIS — Z91041 Radiographic dye allergy status: Secondary | ICD-10-CM

## 2015-04-11 DIAGNOSIS — E871 Hypo-osmolality and hyponatremia: Secondary | ICD-10-CM | POA: Diagnosis present

## 2015-04-11 NOTE — ED Notes (Addendum)
Patient with left flank pain at the site of abdomen drain for an abscess.  Patient has had multiple visits to ED for same.  Patient states that he has had increased pain in area of drain in lower left quadrant.  Purulent drainage noted from drain.  Patient states he has been having fevers and cold chills.

## 2015-04-12 ENCOUNTER — Emergency Department (HOSPITAL_COMMUNITY): Payer: Medicaid Other

## 2015-04-12 ENCOUNTER — Inpatient Hospital Stay (HOSPITAL_COMMUNITY)
Admission: EM | Admit: 2015-04-12 | Discharge: 2015-04-19 | DRG: 862 | Disposition: A | Discharge status: Home Health | Payer: Medicaid Other | Attending: Oncology | Admitting: Oncology

## 2015-04-12 DIAGNOSIS — B999 Unspecified infectious disease: Secondary | ICD-10-CM

## 2015-04-12 DIAGNOSIS — Z9049 Acquired absence of other specified parts of digestive tract: Secondary | ICD-10-CM | POA: Diagnosis not present

## 2015-04-12 DIAGNOSIS — T814XXA Infection following a procedure, initial encounter: Secondary | ICD-10-CM | POA: Diagnosis present

## 2015-04-12 DIAGNOSIS — J9690 Respiratory failure, unspecified, unspecified whether with hypoxia or hypercapnia: Secondary | ICD-10-CM | POA: Diagnosis present

## 2015-04-12 DIAGNOSIS — R1032 Left lower quadrant pain: Secondary | ICD-10-CM | POA: Diagnosis not present

## 2015-04-12 DIAGNOSIS — D649 Anemia, unspecified: Secondary | ICD-10-CM | POA: Diagnosis present

## 2015-04-12 DIAGNOSIS — K6812 Psoas muscle abscess: Secondary | ICD-10-CM | POA: Diagnosis present

## 2015-04-12 DIAGNOSIS — Z933 Colostomy status: Secondary | ICD-10-CM | POA: Diagnosis not present

## 2015-04-12 DIAGNOSIS — Z91041 Radiographic dye allergy status: Secondary | ICD-10-CM | POA: Diagnosis not present

## 2015-04-12 DIAGNOSIS — I482 Chronic atrial fibrillation: Secondary | ICD-10-CM | POA: Diagnosis present

## 2015-04-12 DIAGNOSIS — R509 Fever, unspecified: Secondary | ICD-10-CM | POA: Diagnosis not present

## 2015-04-12 DIAGNOSIS — E669 Obesity, unspecified: Secondary | ICD-10-CM | POA: Diagnosis present

## 2015-04-12 DIAGNOSIS — E860 Dehydration: Secondary | ICD-10-CM | POA: Diagnosis present

## 2015-04-12 DIAGNOSIS — IMO0002 Reserved for concepts with insufficient information to code with codable children: Secondary | ICD-10-CM

## 2015-04-12 DIAGNOSIS — N170 Acute kidney failure with tubular necrosis: Secondary | ICD-10-CM | POA: Diagnosis present

## 2015-04-12 DIAGNOSIS — R7303 Prediabetes: Secondary | ICD-10-CM | POA: Diagnosis present

## 2015-04-12 DIAGNOSIS — Z79899 Other long term (current) drug therapy: Secondary | ICD-10-CM | POA: Diagnosis not present

## 2015-04-12 DIAGNOSIS — R45851 Suicidal ideations: Secondary | ICD-10-CM | POA: Diagnosis present

## 2015-04-12 DIAGNOSIS — Z7901 Long term (current) use of anticoagulants: Secondary | ICD-10-CM | POA: Diagnosis not present

## 2015-04-12 DIAGNOSIS — Z87891 Personal history of nicotine dependence: Secondary | ICD-10-CM | POA: Diagnosis not present

## 2015-04-12 DIAGNOSIS — I4891 Unspecified atrial fibrillation: Secondary | ICD-10-CM | POA: Diagnosis present

## 2015-04-12 DIAGNOSIS — K651 Peritoneal abscess: Secondary | ICD-10-CM | POA: Diagnosis present

## 2015-04-12 DIAGNOSIS — F419 Anxiety disorder, unspecified: Secondary | ICD-10-CM | POA: Diagnosis present

## 2015-04-12 DIAGNOSIS — R1010 Upper abdominal pain, unspecified: Secondary | ICD-10-CM | POA: Diagnosis not present

## 2015-04-12 DIAGNOSIS — N179 Acute kidney failure, unspecified: Secondary | ICD-10-CM | POA: Diagnosis not present

## 2015-04-12 DIAGNOSIS — K559 Vascular disorder of intestine, unspecified: Secondary | ICD-10-CM | POA: Diagnosis present

## 2015-04-12 DIAGNOSIS — A419 Sepsis, unspecified organism: Secondary | ICD-10-CM

## 2015-04-12 DIAGNOSIS — K909 Intestinal malabsorption, unspecified: Secondary | ICD-10-CM | POA: Diagnosis present

## 2015-04-12 DIAGNOSIS — R1030 Lower abdominal pain, unspecified: Secondary | ICD-10-CM | POA: Diagnosis not present

## 2015-04-12 DIAGNOSIS — E861 Hypovolemia: Secondary | ICD-10-CM | POA: Diagnosis present

## 2015-04-12 DIAGNOSIS — B9689 Other specified bacterial agents as the cause of diseases classified elsewhere: Secondary | ICD-10-CM | POA: Diagnosis not present

## 2015-04-12 DIAGNOSIS — F418 Other specified anxiety disorders: Secondary | ICD-10-CM | POA: Diagnosis present

## 2015-04-12 DIAGNOSIS — I5022 Chronic systolic (congestive) heart failure: Secondary | ICD-10-CM | POA: Diagnosis present

## 2015-04-12 DIAGNOSIS — R109 Unspecified abdominal pain: Secondary | ICD-10-CM | POA: Diagnosis present

## 2015-04-12 DIAGNOSIS — R6521 Severe sepsis with septic shock: Secondary | ICD-10-CM | POA: Diagnosis present

## 2015-04-12 DIAGNOSIS — K551 Chronic vascular disorders of intestine: Secondary | ICD-10-CM | POA: Diagnosis not present

## 2015-04-12 DIAGNOSIS — F329 Major depressive disorder, single episode, unspecified: Secondary | ICD-10-CM | POA: Diagnosis present

## 2015-04-12 DIAGNOSIS — Z8619 Personal history of other infectious and parasitic diseases: Secondary | ICD-10-CM | POA: Diagnosis not present

## 2015-04-12 DIAGNOSIS — E871 Hypo-osmolality and hyponatremia: Secondary | ICD-10-CM | POA: Diagnosis present

## 2015-04-12 DIAGNOSIS — E875 Hyperkalemia: Secondary | ICD-10-CM | POA: Diagnosis present

## 2015-04-12 DIAGNOSIS — IMO0001 Reserved for inherently not codable concepts without codable children: Secondary | ICD-10-CM

## 2015-04-12 DIAGNOSIS — Z6833 Body mass index (BMI) 33.0-33.9, adult: Secondary | ICD-10-CM | POA: Diagnosis not present

## 2015-04-12 DIAGNOSIS — T814XXS Infection following a procedure, sequela: Secondary | ICD-10-CM

## 2015-04-12 DIAGNOSIS — R739 Hyperglycemia, unspecified: Secondary | ICD-10-CM | POA: Diagnosis present

## 2015-04-12 LAB — CBC WITH DIFFERENTIAL/PLATELET
Basophils Absolute: 0 10*3/uL (ref 0.0–0.1)
Basophils Relative: 0 %
EOS PCT: 1 %
Eosinophils Absolute: 0.2 10*3/uL (ref 0.0–0.7)
HEMATOCRIT: 36.2 % — AB (ref 39.0–52.0)
HEMOGLOBIN: 11.5 g/dL — AB (ref 13.0–17.0)
LYMPHS ABS: 1.8 10*3/uL (ref 0.7–4.0)
LYMPHS PCT: 8 %
MCH: 29.6 pg (ref 26.0–34.0)
MCHC: 31.8 g/dL (ref 30.0–36.0)
MCV: 93.3 fL (ref 78.0–100.0)
MONOS PCT: 12 %
Monocytes Absolute: 2.6 10*3/uL — ABNORMAL HIGH (ref 0.1–1.0)
NEUTROS PCT: 79 %
Neutro Abs: 17.3 10*3/uL — ABNORMAL HIGH (ref 1.7–7.7)
Platelets: 619 10*3/uL — ABNORMAL HIGH (ref 150–400)
RBC: 3.88 MIL/uL — AB (ref 4.22–5.81)
RDW: 16.6 % — ABNORMAL HIGH (ref 11.5–15.5)
WBC: 21.9 10*3/uL — AB (ref 4.0–10.5)

## 2015-04-12 LAB — CBC
HCT: 30.9 % — ABNORMAL LOW (ref 39.0–52.0)
HEMATOCRIT: 30.8 % — AB (ref 39.0–52.0)
HEMOGLOBIN: 10 g/dL — AB (ref 13.0–17.0)
HEMOGLOBIN: 10.2 g/dL — AB (ref 13.0–17.0)
MCH: 30.2 pg (ref 26.0–34.0)
MCH: 30.6 pg (ref 26.0–34.0)
MCHC: 32.4 g/dL (ref 30.0–36.0)
MCHC: 33.1 g/dL (ref 30.0–36.0)
MCV: 92.5 fL (ref 78.0–100.0)
MCV: 93.4 fL (ref 78.0–100.0)
PLATELETS: 560 10*3/uL — AB (ref 150–400)
Platelets: 550 10*3/uL — ABNORMAL HIGH (ref 150–400)
RBC: 3.31 MIL/uL — AB (ref 4.22–5.81)
RBC: 3.33 MIL/uL — AB (ref 4.22–5.81)
RDW: 16.5 % — ABNORMAL HIGH (ref 11.5–15.5)
RDW: 16.9 % — ABNORMAL HIGH (ref 11.5–15.5)
WBC: 12.6 10*3/uL — AB (ref 4.0–10.5)
WBC: 15.7 10*3/uL — ABNORMAL HIGH (ref 4.0–10.5)

## 2015-04-12 LAB — GASTROINTESTINAL PANEL BY PCR, STOOL (REPLACES STOOL CULTURE)
ADENOVIRUS F40/41: NOT DETECTED
ASTROVIRUS: NOT DETECTED
Campylobacter species: NOT DETECTED
Cryptosporidium: NOT DETECTED
Cyclospora cayetanensis: NOT DETECTED
E. COLI O157: NOT DETECTED
ENTAMOEBA HISTOLYTICA: NOT DETECTED
ENTEROPATHOGENIC E COLI (EPEC): NOT DETECTED
ENTEROTOXIGENIC E COLI (ETEC): NOT DETECTED
Enteroaggregative E coli (EAEC): NOT DETECTED
Giardia lamblia: NOT DETECTED
NOROVIRUS GI/GII: NOT DETECTED
Plesimonas shigelloides: NOT DETECTED
Rotavirus A: NOT DETECTED
SAPOVIRUS (I, II, IV, AND V): NOT DETECTED
SHIGA LIKE TOXIN PRODUCING E COLI (STEC): NOT DETECTED
SHIGELLA/ENTEROINVASIVE E COLI (EIEC): NOT DETECTED
Salmonella species: NOT DETECTED
VIBRIO CHOLERAE: NOT DETECTED
Vibrio species: NOT DETECTED
Yersinia enterocolitica: NOT DETECTED

## 2015-04-12 LAB — PROTEIN / CREATININE RATIO, URINE
CREATININE, URINE: 68.18 mg/dL
Protein Creatinine Ratio: 0.94 mg/mg{Cre} — ABNORMAL HIGH (ref 0.00–0.15)
Total Protein, Urine: 64 mg/dL

## 2015-04-12 LAB — BASIC METABOLIC PANEL
ANION GAP: 10 (ref 5–15)
BUN: 49 mg/dL — ABNORMAL HIGH (ref 6–20)
CALCIUM: 8.6 mg/dL — AB (ref 8.9–10.3)
CO2: 20 mmol/L — AB (ref 22–32)
CREATININE: 4.14 mg/dL — AB (ref 0.61–1.24)
Chloride: 100 mmol/L — ABNORMAL LOW (ref 101–111)
GFR calc non Af Amer: 16 mL/min — ABNORMAL LOW (ref >60)
GFR, EST AFRICAN AMERICAN: 18 mL/min — AB (ref >60)
Glucose, Bld: 183 mg/dL — ABNORMAL HIGH (ref 65–99)
Potassium: 5.8 mmol/L — ABNORMAL HIGH (ref 3.5–5.1)
SODIUM: 130 mmol/L — AB (ref 135–145)

## 2015-04-12 LAB — COMPREHENSIVE METABOLIC PANEL
ALK PHOS: 68 U/L (ref 38–126)
ALT: 44 U/L (ref 17–63)
ALT: 31 U/L (ref 17–63)
AST: 14 U/L — ABNORMAL LOW (ref 15–41)
AST: 22 U/L (ref 15–41)
Albumin: 2.9 g/dL — ABNORMAL LOW (ref 3.5–5.0)
Albumin: 2.3 g/dL — ABNORMAL LOW (ref 3.5–5.0)
Alkaline Phosphatase: 86 U/L (ref 38–126)
Anion gap: 14 (ref 5–15)
Anion gap: 9 (ref 5–15)
BILIRUBIN TOTAL: 0.3 mg/dL (ref 0.3–1.2)
BUN: 35 mg/dL — ABNORMAL HIGH (ref 6–20)
BUN: 51 mg/dL — AB (ref 6–20)
CALCIUM: 9.3 mg/dL (ref 8.9–10.3)
CHLORIDE: 92 mmol/L — AB (ref 101–111)
CO2: 22 mmol/L (ref 22–32)
CO2: 24 mmol/L (ref 22–32)
CREATININE: 2.78 mg/dL — AB (ref 0.61–1.24)
Calcium: 10.3 mg/dL (ref 8.9–10.3)
Chloride: 105 mmol/L (ref 101–111)
Creatinine, Ser: 4.09 mg/dL — ABNORMAL HIGH (ref 0.61–1.24)
GFR calc Af Amer: 19 mL/min — ABNORMAL LOW (ref >60)
GFR calc non Af Amer: 26 mL/min — ABNORMAL LOW (ref >60)
GFR, EST AFRICAN AMERICAN: 30 mL/min — AB (ref >60)
GFR, EST NON AFRICAN AMERICAN: 16 mL/min — AB (ref >60)
GLUCOSE: 136 mg/dL — AB (ref 65–99)
Glucose, Bld: 128 mg/dL — ABNORMAL HIGH (ref 65–99)
POTASSIUM: 6.6 mmol/L — AB (ref 3.5–5.1)
Potassium: 4.8 mmol/L (ref 3.5–5.1)
SODIUM: 138 mmol/L (ref 135–145)
Sodium: 128 mmol/L — ABNORMAL LOW (ref 135–145)
TOTAL PROTEIN: 7.1 g/dL (ref 6.5–8.1)
Total Bilirubin: 0.4 mg/dL (ref 0.3–1.2)
Total Protein: 8.4 g/dL — ABNORMAL HIGH (ref 6.5–8.1)

## 2015-04-12 LAB — URINE MICROSCOPIC-ADD ON

## 2015-04-12 LAB — C DIFFICILE QUICK SCREEN W PCR REFLEX
C DIFFICILE (CDIFF) TOXIN: NEGATIVE
C Diff antigen: NEGATIVE
C Diff interpretation: NEGATIVE

## 2015-04-12 LAB — GLUCOSE, CAPILLARY
GLUCOSE-CAPILLARY: 129 mg/dL — AB (ref 65–99)
Glucose-Capillary: 118 mg/dL — ABNORMAL HIGH (ref 65–99)
Glucose-Capillary: 110 mg/dL — ABNORMAL HIGH (ref 65–99)

## 2015-04-12 LAB — I-STAT CHEM 8, ED
BUN: 52 mg/dL — ABNORMAL HIGH (ref 6–20)
CALCIUM ION: 1.19 mmol/L (ref 1.12–1.23)
CREATININE: 4.2 mg/dL — AB (ref 0.61–1.24)
Chloride: 94 mmol/L — ABNORMAL LOW (ref 101–111)
GLUCOSE: 225 mg/dL — AB (ref 65–99)
HCT: 35 % — ABNORMAL LOW (ref 39.0–52.0)
HEMOGLOBIN: 11.9 g/dL — AB (ref 13.0–17.0)
Potassium: 6.4 mmol/L (ref 3.5–5.1)
Sodium: 126 mmol/L — ABNORMAL LOW (ref 135–145)
TCO2: 23 mmol/L (ref 0–100)

## 2015-04-12 LAB — MRSA PCR SCREENING: MRSA BY PCR: NEGATIVE

## 2015-04-12 LAB — I-STAT TROPONIN, ED: TROPONIN I, POC: 0 ng/mL (ref 0.00–0.08)

## 2015-04-12 LAB — I-STAT CG4 LACTIC ACID, ED
LACTIC ACID, VENOUS: 1.54 mmol/L (ref 0.5–2.0)
LACTIC ACID, VENOUS: 1.82 mmol/L (ref 0.5–2.0)
Lactic Acid, Venous: 1.79 mmol/L (ref 0.5–2.0)

## 2015-04-12 LAB — PROTIME-INR
INR: 2.76 — ABNORMAL HIGH (ref 0.00–1.49)
Prothrombin Time: 28.8 seconds — ABNORMAL HIGH (ref 11.6–15.2)

## 2015-04-12 LAB — PHOSPHORUS: Phosphorus: 5.1 mg/dL — ABNORMAL HIGH (ref 2.5–4.6)

## 2015-04-12 LAB — LACTIC ACID, PLASMA: LACTIC ACID, VENOUS: 0.8 mmol/L (ref 0.5–2.0)

## 2015-04-12 LAB — NA AND K (SODIUM & POTASSIUM), RAND UR: Potassium Urine: 36 mmol/L

## 2015-04-12 LAB — URINALYSIS, ROUTINE W REFLEX MICROSCOPIC
Glucose, UA: NEGATIVE mg/dL
KETONES UR: 15 mg/dL — AB
Nitrite: NEGATIVE
PH: 5 (ref 5.0–8.0)
PROTEIN: 30 mg/dL — AB
SPECIFIC GRAVITY, URINE: 1.025 (ref 1.005–1.030)

## 2015-04-12 LAB — LIPASE, BLOOD: LIPASE: 24 U/L (ref 11–51)

## 2015-04-12 LAB — CORTISOL: Cortisol, Plasma: 12.7 ug/dL

## 2015-04-12 LAB — CREATININE, URINE, RANDOM: Creatinine, Urine: 67.99 mg/dL

## 2015-04-12 LAB — MAGNESIUM: Magnesium: 2 mg/dL (ref 1.7–2.4)

## 2015-04-12 LAB — PROCALCITONIN: Procalcitonin: 0.55 ng/mL

## 2015-04-12 MED ORDER — ONDANSETRON HCL 4 MG/2ML IJ SOLN: 4.0000 mg | Freq: Four times a day (QID) | INTRAMUSCULAR | Status: DC | PRN

## 2015-04-12 MED ORDER — DEXTROSE 50 % IV SOLN
1.0000 | Freq: Once | INTRAVENOUS | Status: AC
  Administered 2015-04-12: 50 mL via INTRAVENOUS
  Filled 2015-04-12: qty 50

## 2015-04-12 MED ORDER — SODIUM CHLORIDE 0.9 % IV SOLN
100.0000 mg | INTRAVENOUS | Status: DC
  Administered 2015-04-13 – 2015-04-15 (×3): 100 mg via INTRAVENOUS
  Filled 2015-04-12 (×4): qty 100

## 2015-04-12 MED ORDER — HEPARIN SODIUM (PORCINE) 5000 UNIT/ML IJ SOLN
5000.0000 [IU] | Freq: Three times a day (TID) | INTRAMUSCULAR | Status: DC
  Administered 2015-04-12 – 2015-04-13 (×3): 5000 [IU] via SUBCUTANEOUS
  Filled 2015-04-12 (×4): qty 1

## 2015-04-12 MED ORDER — CETYLPYRIDINIUM CHLORIDE 0.05 % MT LIQD
7.0000 mL | Freq: Two times a day (BID) | OROMUCOSAL | Status: DC
  Administered 2015-04-12 – 2015-04-19 (×6): 7 mL via OROMUCOSAL

## 2015-04-12 MED ORDER — SODIUM CHLORIDE 0.9 % IV SOLN: 250.0000 mL | INTRAVENOUS | Status: DC | PRN

## 2015-04-12 MED ORDER — SODIUM CHLORIDE 0.9 % IV SOLN
1500.0000 mg | INTRAVENOUS | Status: DC
  Administered 2015-04-12: 1500 mg via INTRAVENOUS
  Filled 2015-04-12: qty 1500

## 2015-04-12 MED ORDER — SODIUM CHLORIDE 0.9 % IV SOLN
200.0000 mg | Freq: Once | INTRAVENOUS | Status: AC
  Administered 2015-04-12: 200 mg via INTRAVENOUS
  Filled 2015-04-12: qty 200

## 2015-04-12 MED ORDER — INSULIN ASPART 100 UNIT/ML ~~LOC~~ SOLN
10.0000 [IU] | Freq: Once | SUBCUTANEOUS | Status: AC
  Administered 2015-04-12: 10 [IU] via INTRAVENOUS
  Filled 2015-04-12: qty 1

## 2015-04-12 MED ORDER — VANCOMYCIN HCL IN DEXTROSE 1-5 GM/200ML-% IV SOLN
1000.0000 mg | Freq: Once | INTRAVENOUS | Status: AC
  Administered 2015-04-12: 1000 mg via INTRAVENOUS
  Filled 2015-04-12: qty 200

## 2015-04-12 MED ORDER — PIPERACILLIN-TAZOBACTAM 3.375 G IVPB 30 MIN
3.3750 g | Freq: Once | INTRAVENOUS | Status: AC
  Administered 2015-04-12: 3.375 g via INTRAVENOUS
  Filled 2015-04-12: qty 50

## 2015-04-12 MED ORDER — NOREPINEPHRINE BITARTRATE 1 MG/ML IV SOLN
2.0000 ug/min | INTRAVENOUS | Status: DC
  Administered 2015-04-12: 6 ug/min via INTRAVENOUS
  Administered 2015-04-13 (×2): 2 ug/min via INTRAVENOUS
  Administered 2015-04-13: 3 ug/min via INTRAVENOUS
  Filled 2015-04-12 (×3): qty 4

## 2015-04-12 MED ORDER — PIPERACILLIN-TAZOBACTAM 3.375 G IVPB
3.3750 g | Freq: Three times a day (TID) | INTRAVENOUS | Status: DC
  Administered 2015-04-12 – 2015-04-16 (×11): 3.375 g via INTRAVENOUS
  Filled 2015-04-12 (×15): qty 50

## 2015-04-12 MED ORDER — STERILE WATER FOR INJECTION IV SOLN
INTRAVENOUS | Status: DC
  Administered 2015-04-12 – 2015-04-13 (×3): via INTRAVENOUS
  Filled 2015-04-12 (×4): qty 850

## 2015-04-12 MED ORDER — SODIUM CHLORIDE 0.9 % IV BOLUS (SEPSIS)
1000.0000 mL | INTRAVENOUS | Status: AC
  Administered 2015-04-12 (×3): 1000 mL via INTRAVENOUS

## 2015-04-12 MED ORDER — ONDANSETRON HCL 4 MG/2ML IJ SOLN
4.0000 mg | Freq: Once | INTRAMUSCULAR | Status: AC
  Administered 2015-04-12: 4 mg via INTRAVENOUS
  Filled 2015-04-12: qty 2

## 2015-04-12 MED ORDER — SODIUM CHLORIDE 0.9 % IV BOLUS (SEPSIS)
1000.0000 mL | Freq: Once | INTRAVENOUS | Status: AC
  Administered 2015-04-12: 1000 mL via INTRAVENOUS

## 2015-04-12 MED ORDER — LORAZEPAM 2 MG/ML IJ SOLN
1.0000 mg | Freq: Once | INTRAMUSCULAR | Status: AC
  Administered 2015-04-13: 1 mg via INTRAVENOUS
  Filled 2015-04-12: qty 1

## 2015-04-12 MED ORDER — MORPHINE SULFATE (PF) 4 MG/ML IV SOLN
4.0000 mg | Freq: Once | INTRAVENOUS | Status: AC
  Administered 2015-04-12: 4 mg via INTRAVENOUS
  Filled 2015-04-12: qty 1

## 2015-04-12 MED ORDER — SODIUM POLYSTYRENE SULFONATE 15 GM/60ML PO SUSP
30.0000 g | Freq: Once | ORAL | Status: AC
  Administered 2015-04-12: 30 g via ORAL
  Filled 2015-04-12: qty 120

## 2015-04-12 MED ORDER — NOREPINEPHRINE BITARTRATE 1 MG/ML IV SOLN
0.0000 ug/min | Freq: Once | INTRAVENOUS | Status: AC
  Administered 2015-04-12: 2 ug/min via INTRAVENOUS
  Filled 2015-04-12: qty 4

## 2015-04-12 MED ORDER — FENTANYL CITRATE (PF) 100 MCG/2ML IJ SOLN
25.0000 ug | INTRAMUSCULAR | Status: DC | PRN
  Administered 2015-04-12 – 2015-04-16 (×18): 25 ug via INTRAVENOUS
  Filled 2015-04-12 (×17): qty 2

## 2015-04-12 MED ORDER — SODIUM CHLORIDE 0.9 % IV SOLN
100.0000 mg | INTRAVENOUS | Status: DC
  Filled 2015-04-12: qty 100

## 2015-04-12 MED ORDER — ZOLPIDEM TARTRATE 5 MG PO TABS
5.0000 mg | ORAL_TABLET | Freq: Every evening | ORAL | Status: DC | PRN
Start: 2015-04-12 — End: 2015-04-19

## 2015-04-12 MED ORDER — INSULIN ASPART 100 UNIT/ML ~~LOC~~ SOLN
0.0000 [IU] | SUBCUTANEOUS | Status: DC
  Administered 2015-04-13 – 2015-04-14 (×3): 2 [IU] via SUBCUTANEOUS
  Administered 2015-04-14: 3 [IU] via SUBCUTANEOUS
  Administered 2015-04-14 – 2015-04-15 (×2): 2 [IU] via SUBCUTANEOUS
  Administered 2015-04-15 (×2): 3 [IU] via SUBCUTANEOUS
  Administered 2015-04-15 – 2015-04-16 (×4): 2 [IU] via SUBCUTANEOUS

## 2015-04-12 MED ORDER — BARIUM SULFATE 2.1 % PO SUSP
ORAL | Status: DC
Start: 2015-04-12 — End: 2015-04-12
  Filled 2015-04-12: qty 2

## 2015-04-12 MED ORDER — SODIUM CHLORIDE 0.9 % IV BOLUS (SEPSIS)
500.0000 mL | INTRAVENOUS | Status: AC
  Administered 2015-04-12: 500 mL via INTRAVENOUS

## 2015-04-12 MED ORDER — ACETAMINOPHEN 325 MG PO TABS: 650.0000 mg | ORAL_TABLET | ORAL | Status: DC | PRN

## 2015-04-12 MED ORDER — FENTANYL CITRATE (PF) 100 MCG/2ML IJ SOLN
50.0000 ug | Freq: Once | INTRAMUSCULAR | Status: AC
  Administered 2015-04-12: 50 ug via INTRAVENOUS
  Filled 2015-04-12: qty 2

## 2015-04-12 MED ORDER — SODIUM CHLORIDE 0.9 % IV SOLN
INTRAVENOUS | Status: DC
  Administered 2015-04-12: 15:00:00 via INTRAVENOUS

## 2015-04-12 NOTE — ED Provider Notes (Addendum)
I saw and evaluated the patient, reviewed the resident's note and I agree with the findings and plan.   EKG Interpretation   Date/Time:  Sunday April 12 2015 00:37:53 EDT Ventricular Rate:  95 PR Interval:    QRS Duration: 83 QT Interval:  318 QTC Calculation: 400 R Axis:   68 Text Interpretation:  Atrial fibrillation Low voltage, precordial leads  Baseline wander in lead(s) V3 Confirmed by Choua Chalker  MD, [place] ([id number]) on  04/12/2015 4:54:36 AM       Patient presents with worsening left-sided abdominal pain and generalized weakness 1 week. Patient with extensive abdominal surgical history including colostomy, chronic wounds, and a persistent intra-abdominal abscess with IR drains in place. He is followed by Dr. Donne Hazel. Reports a temperature to 101 at home prior to arrival. Per nursing staff, patient had an episode of unresponsiveness in the lobby where he was pale and diaphoretic. Patient does not have any recollection of this. On my initial evaluation, he is mildly tachycardic, chronically ill-appearing, blood pressure 73/55. Baseline blood pressure [id number] systolic. Sepsis protocol initiated. 30 mL/kg ordered. Patient was given IV Zosyn for presumed intra-abdominal infection.  Lab work notable for a normal lactate. Patient's white count is 21.9 with a hemoglobin of 11.5. Patient is likely septic from an intra-abdominal source. Urine and chest x-ray are clear. CMP notable for hyponatremia, hyperkalemia, and a creatinine of 4.09. This is also new for the patient. After 3 L of fluid, patient persistently hypotensive.  We have said started through peripheral IV. Surgery consult. Dr. Redmond Pulling recommends IR evaluation tomorrow and primary evaluation by Dr. Donne Hazel. Critical care consulted given patient's refractory hypotension. At this time, patient is refusing central line placement. Patient also received 2 A of D50, 10 units of insulin 2, and Kayexalate for his hyperkalemia. He has no acute EKG  changes.  Also discussed case with nephrology, Dr. Jonnie Finner, who will consult.  6:08 AM Following critical or care evaluation of the patient. He will now consented to central line. Risk and benefits explained. It was placed under emergent conditions. Right IJ was placed under ultrasound guidance.  CENTRAL LINE Performed by: Thayer Jew F Consent: The procedure was performed in an emergent situation. Required items: required blood products, implants, devices, and special equipment available Patient identity confirmed: arm band and provided demographic data Time out: Immediately prior to procedure a "time out" was called to verify the correct patient, procedure, equipment, support staff and site/side marked as required. Indications: vascular access Anesthesia: local infiltration Local anesthetic: lidocaine 1% with epinephrine Anesthetic total: 3 ml Patient sedated: no Preparation: skin prepped with 2% chlorhexidine Skin prep agent dried: skin prep agent completely dried prior to procedure Sterile barriers: all five maximum sterile barriers used - cap, mask, sterile gown, sterile gloves, and large sterile sheet Hand hygiene: hand hygiene performed prior to central venous catheter insertion  Location details: Right IJ  Catheter type: triple lumen Catheter size: 8 Fr Pre-procedure: landmarks identified, [place] Ultrasound guidance: yes Successful placement: yes Post-procedure: line sutured and dressing applied Assessment: blood return through all parts, free fluid flow, placement verified by x-ray and no pneumothorax on x-ray Patient tolerance: Patient tolerated the procedure well with no immediate complications.   CRITICAL CARE Performed by: Merryl Hacker   Total critical care time: 45 minutes  Critical care time was exclusive of separately billable procedures and treating other patients.  Critical care was necessary to treat or prevent imminent or life-threatening  deterioration.  Critical care was time spent  personally by me on the following activities: development of treatment plan with patient and/or surrogate as well as nursing, discussions with consultants, evaluation of patient's response to treatment, examination of patient, obtaining history from patient or surrogate, ordering and performing treatments and interventions, ordering and review of laboratory studies, ordering and review of radiographic studies, pulse oximetry and re-evaluation of patient's condition.  Sepsis - Repeat Assessment  Performed at:    4:59 AM   Vitals     Blood pressure 89/48, pulse 86, temperature 98 F (36.7 C), temperature source Oral, resp. rate 20, height 5\' 10"  (1.778 m), weight 235 lb (106.595 kg), SpO2 97 %.  Heart:     Irregular rate and rhythm  Lungs:    Rales  Capillary Refill:   <2 sec  Peripheral Pulse:   Radial pulse palpable  Skin:     Pale  Diagnosis: Septic shock (HCC)  Fever - Plan: DG Chest Port 1 View, DG Chest Port 1 View  Pain in the abdomen - Plan: CT Abdomen Pelvis Wo Contrast, CT Abdomen Pelvis Wo Contrast  Intra-abdominal infection  Hyperkalemia  AKI (acute kidney injury) (HCC)   Merryl Hacker, MD 04/12/15 [id number]  Merryl Hacker, MD 04/12/15 [id number]    Merryl Hacker, MD 04/12/15 [id number]

## 2015-04-12 NOTE — Consult Note (Signed)
Reason for Consult:sepsis, abscesses Referring Physician: dr William Fischer is an 50 y.o. male.  HPI: this pt is well known to [place]. William Fischer is a 50 year old male s/p exploratory laparotomy, left colectomy, end colostomy for ischemic bowel---Dr. Donne Fischer [birth year] complicated by post op intra-abdominal abscesses and rectal stump leak.prolonged hospitalization. Has had his perc drains for quite sometime. 1 drain keeps draining a fair amount of purulent fluid so earlier in week he underwent SBFT to see if any leak from SB and there was none. Drains were also injected without any evidence of leak. Colostomy functioning normally. States that one drain has been filling up with pus - so much that he has to empty bag twice a day  He states for past several days he has had fever, chills, pain on LLQ near drain sites, significant decreased in uop, dizziness. States he passed out in waiting room. Found to be severely hypotensive, ARF, electrolyte issues.   Past Medical History  Diagnosis Date  . Chicken pox as a child  . Migraine 06/29/2011  . Obesity 06/29/2011  . Depression with anxiety 06/29/2011  . Atrial fibrillation ([place])     admx 11/13 with acute sCHF in setting of RVR  => a. failed DCCV x 2; b. Pradaxa started;  c. failed sotalol  . Chronic systolic heart failure ([place])     a. echo 11/13: Ef 40-45%, diff HK, mod MR, mod LAE, mild RVE, mod RAE, small effusion;   b. TEE 11/13:  EF 35-40%, no LAA clot; Echo 2/14 shows normal EF  . Cardiomyopathy ([place])     likely tachy mediated in setting of AF with RVR  . Snoring     patient needs sleep study - has declined  . Allergy to IVP dye   . Chronic anticoagulation     Pradaxa    Past Surgical History  Procedure Laterality Date  . Tee without cardioversion  11/30/2011    Procedure: TRANSESOPHAGEAL ECHOCARDIOGRAM (TEE);  Surgeon: Thayer Headings, MD;  Location: [place];  Service: Cardiovascular;  Laterality: N/A;  . Cardioversion   11/30/2011    Procedure: CARDIOVERSION;  Surgeon: Thayer Headings, MD;  Location: [place];  Service: Cardiovascular;  Laterality: N/A;  . Cardioversion  12/03/2011    Procedure: CARDIOVERSION;  Surgeon: Jolaine Artist, MD;  Location: [place];  Service: Cardiovascular;  Laterality: N/A;  . Laparotomy N/A 01/17/2015    Procedure: EXPLORATORY LAPAROTOMY WITH LEFT COLECTOMY AND COLOSTOMY;  Surgeon: Rolm Bookbinder, MD;  Location: [facility];  Service: General;  Laterality: N/A;    Family History  Problem Relation Age of Onset  . Hypertension Mother   . Hypertension Father   . Aneurysm Father   . Alcohol abuse Father   . Cancer Maternal Grandmother     brain  . Diabetes Maternal Grandfather     type 2  . Parkinsonism Maternal Grandfather   . Cancer Paternal Grandmother     breast  . Diabetes Paternal Grandmother   . Alcohol abuse Paternal Grandfather     Social History:  reports that he quit smoking about 9 months ago. His smoking use included Cigarettes. He has a 20 pack-year smoking history. He has never used smokeless tobacco. He reports that he does not drink alcohol or use illicit drugs.  Allergies:  Allergies  Allergen Reactions  . Contrast Media [Iodinated Diagnostic Agents] Rash    a diffuse macular rash after CTA chest  Pt was premedicated with '125mg'$  IV  Solumedrol, '50mg'$  IV Benadryl 1 hr prior to CTexam, and tolerated procedure without any difficulties. 11/28/11 Also same pre med protocol observed on 02/22/15 and pt tolerated the procedure well    Medications: I have reviewed the patient's current medications.  Results for orders placed or performed during the hospital encounter of 04/12/15 (from the past 48 hour(s))  Comprehensive metabolic panel     Status: Abnormal   Collection Time: 04/11/15 11:58 PM  Result Value Ref Range   Sodium 128 (L) 135 - 145 mmol/L   Potassium 6.6 (HH) 3.5 - 5.1 mmol/L    Comment: NO VISIBLE HEMOLYSIS CRITICAL RESULT CALLED TO, READ BACK  BY AND VERIFIED WITH: FUNK,B RN 0122 3.26.17 MCADOO,G    Chloride 92 (L) 101 - 111 mmol/L   CO2 22 22 - 32 mmol/L   Glucose, Bld 128 (H) 65 - 99 mg/dL   BUN 51 (H) 6 - 20 mg/dL   Creatinine, Ser 4.09 (H) 0.61 - 1.24 mg/dL   Calcium 10.3 8.9 - 10.3 mg/dL   Total Protein 8.4 (H) 6.5 - 8.1 g/dL   Albumin 2.9 (L) 3.5 - 5.0 g/dL   AST 22 15 - 41 U/L   ALT 44 17 - 63 U/L   Alkaline Phosphatase 86 38 - 126 U/L   Total Bilirubin 0.4 0.3 - 1.2 mg/dL   GFR calc non Af Amer 16 (L) >60 mL/min   GFR calc Af Amer 19 (L) >60 mL/min    Comment: (NOTE) The eGFR has been calculated using the CKD EPI equation. This calculation has not been validated in all clinical situations. eGFR's persistently <60 mL/min signify possible Chronic Kidney Disease.    Anion gap 14 5 - 15  CBC WITH DIFFERENTIAL     Status: Abnormal   Collection Time: 04/11/15 11:58 PM  Result Value Ref Range   WBC 21.9 (H) 4.0 - 10.5 K/uL   RBC 3.88 (L) 4.22 - 5.81 MIL/uL   Hemoglobin 11.5 (L) 13.0 - 17.0 g/dL   HCT 36.2 (L) 39.0 - 52.0 %   MCV 93.3 78.0 - 100.0 fL   MCH 29.6 26.0 - 34.0 pg   MCHC 31.8 30.0 - 36.0 g/dL   RDW 16.6 (H) 11.5 - 15.5 %   Platelets 619 (H) 150 - 400 K/uL   Neutrophils Relative % 79 %   Lymphocytes Relative 8 %   Monocytes Relative 12 %   Eosinophils Relative 1 %   Basophils Relative 0 %   Neutro Abs 17.3 (H) 1.7 - 7.7 K/uL   Lymphs Abs 1.8 0.7 - 4.0 K/uL   Monocytes Absolute 2.6 (H) 0.1 - 1.0 K/uL   Eosinophils Absolute 0.2 0.0 - 0.7 K/uL   Basophils Absolute 0.0 0.0 - 0.1 K/uL   WBC Morphology MILD LEFT SHIFT (1-5% METAS, OCC MYELO, OCC BANDS)     Comment: ATYPICAL LYMPHOCYTES RARE   I-Stat CG4 Lactic Acid, ED  (not at  [facility])     Status: None   Collection Time: 04/12/15 12:33 AM  Result Value Ref Range   Lactic Acid, Venous 1.82 0.5 - 2.0 mmol/L  I-Stat Troponin, ED (not at [facility])     Status: None   Collection Time: 04/12/15 12:34 AM  Result Value Ref Range   Troponin i, poc 0.00 0.00 -  0.08 ng/mL   Comment 3            Comment: Due to the release kinetics of cTnI, a negative result within the first hours of  the onset of symptoms does not rule out myocardial infarction with certainty. If myocardial infarction is still suspected, repeat the test at appropriate intervals.   Urinalysis, Routine w reflex microscopic (not at [facility])     Status: Abnormal   Collection Time: 04/12/15  1:11 AM  Result Value Ref Range   Color, Urine AMBER (A) YELLOW    Comment: BIOCHEMICALS MAY BE AFFECTED BY COLOR   APPearance TURBID (A) CLEAR   Specific Gravity, Urine 1.025 1.005 - 1.030   pH 5.0 5.0 - 8.0   Glucose, UA NEGATIVE NEGATIVE mg/dL   Hgb urine dipstick SMALL (A) NEGATIVE   Bilirubin Urine SMALL (A) NEGATIVE   Ketones, ur 15 (A) NEGATIVE mg/dL   Protein, ur 30 (A) NEGATIVE mg/dL   Nitrite NEGATIVE NEGATIVE   Leukocytes, UA SMALL (A) NEGATIVE  Urine microscopic-add on     Status: Abnormal   Collection Time: 04/12/15  1:11 AM  Result Value Ref Range   Squamous Epithelial / LPF 0-5 (A) NONE SEEN   WBC, UA 0-5 0 - 5 WBC/hpf   RBC / HPF 0-5 0 - 5 RBC/hpf   Bacteria, UA MANY (A) NONE SEEN   Casts HYALINE CASTS (A) NEGATIVE  Lipase, blood     Status: None   Collection Time: 04/12/15  2:10 AM  Result Value Ref Range   Lipase 24 11 - 51 U/L  I-Stat Chem 8, ED     Status: Abnormal   Collection Time: 04/12/15  2:37 AM  Result Value Ref Range   Sodium 126 (L) 135 - 145 mmol/L   Potassium 6.4 (HH) 3.5 - 5.1 mmol/L   Chloride 94 (L) 101 - 111 mmol/L   BUN 52 (H) 6 - 20 mg/dL   Creatinine, Ser 4.20 (H) 0.61 - 1.24 mg/dL   Glucose, Bld 225 (H) 65 - 99 mg/dL   Calcium, Ion 1.19 1.12 - 1.23 mmol/L   TCO2 23 0 - 100 mmol/L   Hemoglobin 11.9 (L) 13.0 - 17.0 g/dL   HCT 35.0 (L) 39.0 - 52.0 %   Comment NOTIFIED PHYSICIAN   I-Stat CG4 Lactic Acid, ED     Status: None   Collection Time: 04/12/15  2:38 AM  Result Value Ref Range   Lactic Acid, Venous 1.79 0.5 - 2.0 mmol/L  I-Stat CG4  Lactic Acid, ED  (not at  [facility])     Status: None   Collection Time: 04/12/15  4:14 AM  Result Value Ref Range   Lactic Acid, Venous 1.54 0.5 - 2.0 mmol/L    Ct Abdomen Pelvis Wo Contrast  04/12/2015  CLINICAL DATA:  Constant ongoing abdominal pain and left flank pain, worsening over the last week. Fever, shortness of breath, and generalized weakness. EXAM: CT ABDOMEN AND PELVIS WITHOUT CONTRAST TECHNIQUE: Multidetector CT imaging of the abdomen and pelvis was performed following the standard protocol without IV contrast. COMPARISON:  03/26/2015 FINDINGS: Small left pleural effusion with consolidation in the left base possibly pneumonia. Sub cm low-attenuation lesion in the posterior right lobe of the liver similar to prior study. Gallbladder, pancreas, spleen, adrenal glands, kidneys, abdominal aorta, inferior vena cava, and retroperitoneal lymph nodes are unremarkable. There is a loculated fluid collection under the left hemidiaphragm and posterior to the spleen measuring 4.4 cm maximal diameter. This likely represents a residual or recurrent abscess. Similar collection was seen in this location previously, slightly smaller previously. This fluid collection extends inferiorly along the posterior psoas muscle consistent with psoas abscess. A left lower  quadrant percutaneous drainage catheter is placed into the left anterior pericolic gutter. There is stranding in this area without discrete residual collection. 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 An additional pigtail drainage catheter is present in the left lower quadrant without residual collection. No free air or free fluid in the abdomen. New right abdominal transverse colostomy. Stomach, small bowel, and colon are not abnormally distended. Broad-based anterior abdominal wall hernia containing fat. Pelvis: Prostate gland is enlarged. Bladder is decompressed and cannot  be evaluated. No evidence of diverticulitis. No free or loculated pelvic fluid collections. No destructive bone lesions. IMPRESSION: There is a left subdiaphragmatic abscess measuring 4.4 cm, slightly larger than on previous study. Abscess continues down along the posterior paraspinous muscles to the left psoas muscle consistent with psoas abscess. Appearance is similar to previous study. 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 Right abdominal transverse colostomy. Electronically Signed   By: Burman Nieves M.D.   On: 04/12/2015 03:04   Dg Chest Port 1 View  04/12/2015  CLINICAL DATA:  Fever. EXAM: PORTABLE CHEST 1 VIEW COMPARISON:  01/29/2015 FINDINGS: Cardiac enlargement without vascular congestion or edema. Slight atelectasis in the left lung base. Improvement of the lung bases since previous study. Can't exclude small left pleural effusion. No pneumothorax. Mediastinal contours appear intact. IMPRESSION: Cardiac enlargement. Atelectasis and possible small effusion on the left base. Improvement since previous study. Electronically Signed   By: Burman Nieves M.D.   On: 04/12/2015 00:40    Review of Systems  Constitutional: Positive for fever and chills. Negative for weight loss.  HENT: Negative for nosebleeds.   Eyes: Negative for blurred vision.  Respiratory: Negative for shortness of breath.   Cardiovascular: Negative for chest pain, palpitations, orthopnea and PND.       Denies DOE  Gastrointestinal: Positive for abdominal pain.  Genitourinary: Negative for dysuria and hematuria.  Musculoskeletal: Negative.   Skin: Negative for itching and rash.  Neurological: Positive for dizziness. Negative for focal weakness, seizures, loss of consciousness and headaches.       Denies TIAs, amaurosis fugax  Endo/Heme/Allergies: Does not bruise/bleed easily.  Psychiatric/Behavioral: The patient is not nervous/anxious.    Blood pressure  89/48, pulse 86, temperature 98 F (36.7 C), temperature source Oral, resp. rate 20, height 5\' 10"  (1.778 m), weight 106.595 kg (235 lb), SpO2 97 %. Physical Exam  Vitals reviewed. Constitutional: He is oriented to person, place, and time. He appears well-developed and well-nourished. He appears ill. No distress.  HENT:  Head: Normocephalic and atraumatic.  Right Ear: External ear normal.  Left Ear: External ear normal.  Eyes: Conjunctivae are normal. No scleral icterus.  Neck: Normal range of motion. Neck supple. No tracheal deviation present. No thyromegaly present.  Cardiovascular: Normal rate and normal heart sounds.   Respiratory: Effort normal and breath sounds normal. No stridor. No respiratory distress. He has no wheezes.  GI: Soft. There is no rigidity, no rebound and no guarding.    Colostomy on left; essentially healed midline wound - just no epithelium over wound; 3 perc drains in LLQ; not really tender  Musculoskeletal: He exhibits no edema or tenderness.  Lymphadenopathy:    He has no cervical adenopathy.  Neurological: He is alert and oriented to person, place, and time. He exhibits normal muscle tone.  Skin: Skin is warm and dry. No rash noted. He is  not diaphoretic. No erythema. No pallor.  Psychiatric: He has a normal mood and affect. His behavior is normal. Judgment and thought content normal.    Assessment/Plan: Septic shock Acute renal failure Hyperkalemia Hyponatremia H/o a fib Chronic anticoagulation S/p left colectomy, end colostomy, with rectal stump leak Intra-abd abscesses  Check coags Sepsis protocol Renal dose abx Follow potassium Cont drains for now Broad spectrum abx  Source of purulent drainage unclear. Not sure what to make of ?fistula to stomach seen on CT tonight. Just had drains injected which showed no communication with viscera. Pt was scheduled for a gastrograffin enema on Monday to assess the rectal stump and r/o leak.   Dr William Fischer  (his operative surgeon) is on later today. Will discuss with him and go from there.  Leighton Ruff. Redmond Pulling, MD, FACS General, Bariatric, & Minimally Invasive Surgery [facility], [place]  [facility] M 04/12/2015, 4:18 AM

## 2015-04-12 NOTE — ED Provider Notes (Signed)
CSN: [id number]     Arrival date & time 04/11/15  2324 History   First MD Initiated Contact with Patient 04/12/15 0020     Chief Complaint  Patient presents with  . Post-op Problem     HPI   50 y.o. male with history of atrial fibrillation on Pradaxa, diverticulitis of the colon with perforation, status post colostomy formation with development of an intra-abdominal abscess with 3 drains in place, currently followed by interventional radiology and general surgery, who presents with fever to 101 for the last 24 hours, hypotension, tachycardia. Additionally, patient reports worsening abdominal pain described as a constant sharp sensation. No aggravating or relieving factors. While being triaged, the patient had a syncopal event. This was preceded by lightheadedness. He denies prior history of syncope. Describes poor oral intake over the last couple days. Denies chest pain or shortness of breath. No cardiac history.     Past Medical History  Diagnosis Date  . Chicken pox as a child  . Migraine 06/29/2011  . Obesity 06/29/2011  . Depression with anxiety 06/29/2011  . Atrial fibrillation ([place])     admx 11/13 with acute sCHF in setting of RVR  => a. failed DCCV x 2; b. Pradaxa started;  c. failed sotalol  . Chronic systolic heart failure ([place])     a. echo 11/13: Ef 40-45%, diff HK, mod MR, mod LAE, mild RVE, mod RAE, small effusion;   b. TEE 11/13:  EF 35-40%, no LAA clot; Echo 2/14 shows normal EF  . Cardiomyopathy ([place])     likely tachy mediated in setting of AF with RVR  . Snoring     patient needs sleep study - has declined  . Allergy to IVP dye   . Chronic anticoagulation     Pradaxa   Past Surgical History  Procedure Laterality Date  . Tee without cardioversion  11/30/2011    Procedure: TRANSESOPHAGEAL ECHOCARDIOGRAM (TEE);  Surgeon: Thayer Headings, MD;  Location: [facility];  Service: Cardiovascular;  Laterality: N/A;  . Cardioversion  11/30/2011    Procedure: CARDIOVERSION;   Surgeon: Thayer Headings, MD;  Location: [facility];  Service: Cardiovascular;  Laterality: N/A;  . Cardioversion  12/03/2011    Procedure: CARDIOVERSION;  Surgeon: Jolaine Artist, MD;  Location: [facility];  Service: Cardiovascular;  Laterality: N/A;  . Laparotomy N/A 01/17/2015    Procedure: EXPLORATORY LAPAROTOMY WITH LEFT COLECTOMY AND COLOSTOMY;  Surgeon: Rolm Bookbinder, MD;  Location: [facility];  Service: General;  Laterality: N/A;   Family History  Problem Relation Age of Onset  . Hypertension Mother   . Hypertension Father   . Aneurysm Father   . Alcohol abuse Father   . Cancer Maternal Grandmother     brain  . Diabetes Maternal Grandfather     type 2  . Parkinsonism Maternal Grandfather   . Cancer Paternal Grandmother     breast  . Diabetes Paternal Grandmother   . Alcohol abuse Paternal Grandfather    Social History  Substance Use Topics  . Smoking status: Former Smoker -- 1.00 packs/day for 20 years    Types: Cigarettes    Quit date: 07/10/2014  . Smokeless tobacco: Never Used  . Alcohol Use: No    Review of Systems  Constitutional: Positive for fever and chills. Negative for activity change and appetite change.  HENT: Negative for congestion, facial swelling, rhinorrhea and sore throat.   Eyes: Negative for visual disturbance.  Respiratory: Negative for cough, shortness of breath and  wheezing.   Cardiovascular: Negative for chest pain, palpitations and leg swelling.  Gastrointestinal: Positive for nausea, vomiting and abdominal pain. Negative for diarrhea, constipation and blood in stool.  Genitourinary: Negative for dysuria, frequency, hematuria, flank pain and difficulty urinating.  Musculoskeletal: Negative for myalgias, back pain, joint swelling, arthralgias, neck pain and neck stiffness.  Skin: Negative for rash.  Neurological: Negative for dizziness, weakness, light-headedness and headaches.  Psychiatric/Behavioral: Negative for behavioral problems,  confusion and agitation.      Allergies  Contrast media  Home Medications   Prior to Admission medications   Medication Sig Start Date End Date Taking? Authorizing Provider  acetaminophen (TYLENOL) 500 MG tablet Take 500-1,000 mg by mouth every 6 (six) hours as needed for mild pain or headache.   Yes Historical Provider, MD  feeding supplement (BOOST / RESOURCE BREEZE) LIQD Take 1 Container by mouth 3 (three) times daily between meals. 02/02/15  Yes Liberty Handy, MD  cefTRIAXone 2 g in dextrose 5 % 50 mL Inject 2 g into the vein daily. 04/18/15 05/14/15  Rushil Sherrye Payor, MD  digoxin (LANOXIN) 0.125 MG tablet Take 1 tablet (0.125 mg total) by mouth daily. 04/21/15   Liliane Shi, PA-C  diltiazem (CARDIZEM CD) 120 MG 24 hr capsule Take 1 capsule (120 mg total) by mouth daily. 04/21/15   Liliane Shi, PA-C  escitalopram (LEXAPRO) 10 MG tablet Take 1 tablet (10 mg total) by mouth daily. 04/17/15   Zada Finders, MD  furosemide (LASIX) 40 MG tablet Take 1 tablet (40 mg total) by mouth daily as needed for edema. 04/21/15   Liliane Shi, PA-C  lisinopril (PRINIVIL,ZESTRIL) 5 MG tablet Take 1 tablet (5 mg total) by mouth daily. 04/21/15   Liliane Shi, PA-C  metoprolol (LOPRESSOR) 100 MG tablet Take 1 tablet (100 mg total) by mouth 2 (two) times daily. 03/23/15   Sherran Needs, NP  metroNIDAZOLE (FLAGYL) 500 MG tablet Take 1 tablet (500 mg total) by mouth 3 (three) times daily. 04/19/15 05/15/15  Rushil Sherrye Payor, MD  neomycin-bacitracin-polymyxin (NEOSPORIN) OINT Apply 1 application topically daily. For abdominal wound 04/19/15   Rushil Sherrye Payor, MD  oxyCODONE 10 MG TABS Take 1 tablet (10 mg total) by mouth every 4 (four) hours as needed for moderate pain. 04/18/15   Rushil Sherrye Payor, MD  warfarin (COUMADIN) 5 MG tablet Take 1 tablet (5 mg total) by mouth daily. 04/19/15   Rushil Sherrye Payor, MD   BP 120/82 mmHg  Pulse 86  Temp(Src) 98.3 F (36.8 C) (Oral)  Resp 20  Ht 5\' 10"  (1.778 m)  Wt 116.5 kg  BMI 36.85 kg/m2   SpO2 96% Physical Exam  Constitutional: He is oriented to person, place, and time. He appears well-developed and well-nourished. No distress.  HENT:  Head: Normocephalic and atraumatic.  Right Ear: External ear normal.  Left Ear: External ear normal.  Nose: Nose normal.  Mouth/Throat: Oropharynx is clear and moist. No oropharyngeal exudate.  Eyes: Conjunctivae are normal. Pupils are equal, round, and reactive to light. Right eye exhibits no discharge. Left eye exhibits no discharge. No scleral icterus.  Neck: Normal range of motion. Neck supple. No tracheal deviation present.  Cardiovascular: Regular rhythm and normal heart sounds.  Exam reveals no gallop and no friction rub.   No murmur heard. tachcyardic to 110 bpm  Pulmonary/Chest: Effort normal and breath sounds normal. No respiratory distress. He has no wheezes. He has no rales.  Abdominal:  Colostomy in place, soft  brown stool in bag. Midline healing surgical abdominal wound, mild surrounding erythema. 3 drains in place to the LLQ, with pale brown output in place in bags. Diffuse TTP.   Musculoskeletal: Normal range of motion. He exhibits no edema or tenderness.  Neurological: He is alert and oriented to person, place, and time. He exhibits normal muscle tone.  5/5 strength in the bilateral upper and lower extremities, with intact sensation to light touch.   Skin: Skin is warm and dry. No rash noted. He is not diaphoretic.  Psychiatric: He has a normal mood and affect. His behavior is normal. Judgment and thought content normal.    ED Course  Procedures (including critical care time) Labs Review Labs Reviewed  COMPREHENSIVE METABOLIC PANEL - Abnormal; Notable for the following:    Sodium 128 (*)    Potassium 6.6 (*)    Chloride 92 (*)    Glucose, Bld 128 (*)    BUN 51 (*)    Creatinine, Ser 4.09 (*)    Total Protein 8.4 (*)    Albumin 2.9 (*)    GFR calc non Af Amer 16 (*)    GFR calc Af Amer 19 (*)    All other  components within normal limits  CBC WITH DIFFERENTIAL/PLATELET - Abnormal; Notable for the following:    WBC 21.9 (*)    RBC 3.88 (*)    Hemoglobin 11.5 (*)    HCT 36.2 (*)    RDW 16.6 (*)    Platelets 619 (*)    Neutro Abs 17.3 (*)    Monocytes Absolute 2.6 (*)    All other components within normal limits  URINALYSIS, ROUTINE W REFLEX MICROSCOPIC (NOT AT [facility]) - Abnormal; Notable for the following:    Color, Urine AMBER (*)    APPearance TURBID (*)    Hgb urine dipstick SMALL (*)    Bilirubin Urine SMALL (*)    Ketones, ur 15 (*)    Protein, ur 30 (*)    Leukocytes, UA SMALL (*)    All other components within normal limits  URINE MICROSCOPIC-ADD ON - Abnormal; Notable for the following:    Squamous Epithelial / LPF 0-5 (*)    Bacteria, UA MANY (*)    Casts HYALINE CASTS (*)    All other components within normal limits  PROTIME-INR - Abnormal; Notable for the following:    Prothrombin Time 28.8 (*)    INR 2.76 (*)    All other components within normal limits  PROTEIN / CREATININE RATIO, URINE - Abnormal; Notable for the following:    Protein Creatinine Ratio 0.94 (*)    All other components within normal limits  BASIC METABOLIC PANEL - Abnormal; Notable for the following:    Sodium 130 (*)    Potassium 5.8 (*)    Chloride 100 (*)    CO2 20 (*)    Glucose, Bld 183 (*)    BUN 49 (*)    Creatinine, Ser 4.14 (*)    Calcium 8.6 (*)    GFR calc non Af Amer 16 (*)    GFR calc Af Amer 18 (*)    All other components within normal limits  CBC - Abnormal; Notable for the following:    WBC 12.6 (*)    RBC 3.31 (*)    Hemoglobin 10.0 (*)    HCT 30.9 (*)    RDW 16.9 (*)    Platelets 560 (*)    All other components within normal limits  BASIC METABOLIC  PANEL - Abnormal; Notable for the following:    Glucose, Bld 128 (*)    BUN 27 (*)    Creatinine, Ser 2.24 (*)    GFR calc non Af Amer 33 (*)    GFR calc Af Amer 38 (*)    All other components within normal limits  GLUCOSE,  CAPILLARY - Abnormal; Notable for the following:    Glucose-Capillary 129 (*)    All other components within normal limits  CBC - Abnormal; Notable for the following:    WBC 15.7 (*)    RBC 3.33 (*)    Hemoglobin 10.2 (*)    HCT 30.8 (*)    RDW 16.5 (*)    Platelets 550 (*)    All other components within normal limits  COMPREHENSIVE METABOLIC PANEL - Abnormal; Notable for the following:    Glucose, Bld 136 (*)    BUN 35 (*)    Creatinine, Ser 2.78 (*)    Albumin 2.3 (*)    AST 14 (*)    GFR calc non Af Amer 26 (*)    GFR calc Af Amer 30 (*)    All other components within normal limits  PHOSPHORUS - Abnormal; Notable for the following:    Phosphorus 5.1 (*)    All other components within normal limits  CBC - Abnormal; Notable for the following:    WBC 11.4 (*)    RBC 3.24 (*)    Hemoglobin 9.3 (*)    HCT 30.3 (*)    RDW 16.9 (*)    Platelets 561 (*)    All other components within normal limits  BASIC METABOLIC PANEL - Abnormal; Notable for the following:    Glucose, Bld 111 (*)    BUN 26 (*)    Creatinine, Ser 2.10 (*)    GFR calc non Af Amer 36 (*)    GFR calc Af Amer 42 (*)    All other components within normal limits  GLUCOSE, CAPILLARY - Abnormal; Notable for the following:    Glucose-Capillary 118 (*)    All other components within normal limits  GLUCOSE, CAPILLARY - Abnormal; Notable for the following:    Glucose-Capillary 110 (*)    All other components within normal limits  GLUCOSE, CAPILLARY - Abnormal; Notable for the following:    Glucose-Capillary 125 (*)    All other components within normal limits  GLUCOSE, CAPILLARY - Abnormal; Notable for the following:    Glucose-Capillary 108 (*)    All other components within normal limits  GLUCOSE, CAPILLARY - Abnormal; Notable for the following:    Glucose-Capillary 104 (*)    All other components within normal limits  PROTIME-INR - Abnormal; Notable for the following:    Prothrombin Time 43.9 (*)    INR 4.84  (*)    All other components within normal limits  DIGOXIN LEVEL - Abnormal; Notable for the following:    Digoxin Level <0.2 (*)    All other components within normal limits  PROTIME-INR - Abnormal; Notable for the following:    Prothrombin Time 27.7 (*)    INR 2.62 (*)    All other components within normal limits  APTT - Abnormal; Notable for the following:    aPTT 49 (*)    All other components within normal limits  GLUCOSE, CAPILLARY - Abnormal; Notable for the following:    Glucose-Capillary 107 (*)    All other components within normal limits  GLUCOSE, CAPILLARY - Abnormal; Notable for the following:  Glucose-Capillary 118 (*)    All other components within normal limits  COMPREHENSIVE METABOLIC PANEL - Abnormal; Notable for the following:    Glucose, Bld 137 (*)    BUN 25 (*)    Creatinine, Ser 1.96 (*)    Albumin 2.5 (*)    AST 13 (*)    GFR calc non Af Amer 39 (*)    GFR calc Af Amer 45 (*)    All other components within normal limits  CBC WITH DIFFERENTIAL/PLATELET - Abnormal; Notable for the following:    RBC 3.12 (*)    Hemoglobin 8.9 (*)    HCT 29.8 (*)    MCHC 29.9 (*)    RDW 16.8 (*)    Platelets 546 (*)    Neutro Abs 8.9 (*)    All other components within normal limits  GLUCOSE, CAPILLARY - Abnormal; Notable for the following:    Glucose-Capillary 142 (*)    All other components within normal limits  GLUCOSE, CAPILLARY - Abnormal; Notable for the following:    Glucose-Capillary 141 (*)    All other components within normal limits  GLUCOSE, CAPILLARY - Abnormal; Notable for the following:    Glucose-Capillary 117 (*)    All other components within normal limits  PROTIME-INR - Abnormal; Notable for the following:    Prothrombin Time 18.8 (*)    INR 1.57 (*)    All other components within normal limits  GLUCOSE, CAPILLARY - Abnormal; Notable for the following:    Glucose-Capillary 131 (*)    All other components within normal limits  GLUCOSE,  CAPILLARY - Abnormal; Notable for the following:    Glucose-Capillary 106 (*)    All other components within normal limits  PROTIME-INR - Abnormal; Notable for the following:    Prothrombin Time 18.5 (*)    INR 1.53 (*)    All other components within normal limits  COMPREHENSIVE METABOLIC PANEL - Abnormal; Notable for the following:    Glucose, Bld 137 (*)    BUN 21 (*)    Creatinine, Ser 1.45 (*)    Albumin 2.3 (*)    AST 13 (*)    GFR calc non Af Amer 56 (*)    All other components within normal limits  GLUCOSE, CAPILLARY - Abnormal; Notable for the following:    Glucose-Capillary 180 (*)    All other components within normal limits  GLUCOSE, CAPILLARY - Abnormal; Notable for the following:    Glucose-Capillary 133 (*)    All other components within normal limits  GLUCOSE, CAPILLARY - Abnormal; Notable for the following:    Glucose-Capillary 183 (*)    All other components within normal limits  GLUCOSE, CAPILLARY - Abnormal; Notable for the following:    Glucose-Capillary 116 (*)    All other components within normal limits  GLUCOSE, CAPILLARY - Abnormal; Notable for the following:    Glucose-Capillary 157 (*)    All other components within normal limits  URINALYSIS, ROUTINE W REFLEX MICROSCOPIC (NOT AT [facility]) - Abnormal; Notable for the following:    APPearance CLOUDY (*)    Hgb urine dipstick TRACE (*)    Leukocytes, UA MODERATE (*)    All other components within normal limits  HEPARIN LEVEL (UNFRACTIONATED) - Abnormal; Notable for the following:    Heparin Unfractionated <0.10 (*)    All other components within normal limits  GLUCOSE, CAPILLARY - Abnormal; Notable for the following:    Glucose-Capillary 141 (*)    All other components within normal  limits  GLUCOSE, CAPILLARY - Abnormal; Notable for the following:    Glucose-Capillary 149 (*)    All other components within normal limits  GLUCOSE, CAPILLARY - Abnormal; Notable for the following:    Glucose-Capillary 106  (*)    All other components within normal limits  URINE MICROSCOPIC-ADD ON - Abnormal; Notable for the following:    Squamous Epithelial / LPF 0-5 (*)    Bacteria, UA FEW (*)    All other components within normal limits  CBC - Abnormal; Notable for the following:    WBC 12.3 (*)    RBC 3.24 (*)    Hemoglobin 9.7 (*)    HCT 30.6 (*)    RDW 16.3 (*)    Platelets 494 (*)    All other components within normal limits  PROTIME-INR - Abnormal; Notable for the following:    Prothrombin Time 21.8 (*)    INR 1.91 (*)    All other components within normal limits  GLUCOSE, CAPILLARY - Abnormal; Notable for the following:    Glucose-Capillary 126 (*)    All other components within normal limits  BASIC METABOLIC PANEL - Abnormal; Notable for the following:    Potassium 3.2 (*)    Glucose, Bld 119 (*)    All other components within normal limits  GLUCOSE, CAPILLARY - Abnormal; Notable for the following:    Glucose-Capillary 141 (*)    All other components within normal limits  HEPARIN LEVEL (UNFRACTIONATED) - Abnormal; Notable for the following:    Heparin Unfractionated 0.26 (*)    All other components within normal limits  HEPARIN LEVEL (UNFRACTIONATED) - Abnormal; Notable for the following:    Heparin Unfractionated 0.26 (*)    All other components within normal limits  CBC - Abnormal; Notable for the following:    WBC 11.9 (*)    RBC 3.24 (*)    Hemoglobin 9.5 (*)    HCT 30.6 (*)    RDW 16.5 (*)    Platelets 526 (*)    All other components within normal limits  PROTIME-INR - Abnormal; Notable for the following:    Prothrombin Time 24.4 (*)    INR 2.22 (*)    All other components within normal limits  BASIC METABOLIC PANEL - Abnormal; Notable for the following:    Potassium 3.3 (*)    Glucose, Bld 127 (*)    All other components within normal limits  IRON AND TIBC - Abnormal; Notable for the following:    TIBC 216 (*)    All other components within normal limits  FERRITIN -  Abnormal; Notable for the following:    Ferritin 727 (*)    All other components within normal limits  RETICULOCYTES - Abnormal; Notable for the following:    RBC. 3.24 (*)    All other components within normal limits  MAGNESIUM - Abnormal; Notable for the following:    Magnesium 1.5 (*)    All other components within normal limits  GLUCOSE, CAPILLARY - Abnormal; Notable for the following:    Glucose-Capillary 109 (*)    All other components within normal limits  CBC - Abnormal; Notable for the following:    WBC 12.7 (*)    RBC 3.18 (*)    Hemoglobin 9.6 (*)    HCT 30.1 (*)    RDW 16.4 (*)    Platelets 465 (*)    All other components within normal limits  PROTIME-INR - Abnormal; Notable for the following:    Prothrombin Time  22.6 (*)    INR 2.01 (*)    All other components within normal limits  GLUCOSE, CAPILLARY - Abnormal; Notable for the following:    Glucose-Capillary 136 (*)    All other components within normal limits  GLUCOSE, CAPILLARY - Abnormal; Notable for the following:    Glucose-Capillary 144 (*)    All other components within normal limits  GLUCOSE, CAPILLARY - Abnormal; Notable for the following:    Glucose-Capillary 147 (*)    All other components within normal limits  CBC - Abnormal; Notable for the following:    WBC 12.8 (*)    RBC 3.21 (*)    Hemoglobin 9.3 (*)    HCT 30.4 (*)    RDW 16.8 (*)    Platelets 467 (*)    All other components within normal limits  PROTIME-INR - Abnormal; Notable for the following:    Prothrombin Time 25.0 (*)    INR 2.29 (*)    All other components within normal limits  GLUCOSE, CAPILLARY - Abnormal; Notable for the following:    Glucose-Capillary 117 (*)    All other components within normal limits  GLUCOSE, CAPILLARY - Abnormal; Notable for the following:    Glucose-Capillary 123 (*)    All other components within normal limits  GLUCOSE, CAPILLARY - Abnormal; Notable for the following:    Glucose-Capillary 142 (*)     All other components within normal limits  I-STAT CHEM 8, ED - Abnormal; Notable for the following:    Sodium 126 (*)    Potassium 6.4 (*)    Chloride 94 (*)    BUN 52 (*)    Creatinine, Ser 4.20 (*)    Glucose, Bld 225 (*)    Hemoglobin 11.9 (*)    HCT 35.0 (*)    All other components within normal limits  CULTURE, BLOOD (ROUTINE X 2)  CULTURE, BLOOD (ROUTINE X 2)  URINE CULTURE  C DIFFICILE QUICK SCREEN W PCR REFLEX  GASTROINTESTINAL PANEL BY PCR, STOOL (REPLACES STOOL CULTURE)  MRSA PCR SCREENING  CULTURE, ROUTINE-ABSCESS  LIPASE, BLOOD  NA AND K (SODIUM & POTASSIUM), RAND UR  CREATININE, URINE, RANDOM  UREA NITROGEN, URINE  MAGNESIUM  LACTIC ACID, PLASMA  PROCALCITONIN  CORTISOL  PHOSPHORUS  GLUCOSE, CAPILLARY  GLUCOSE, CAPILLARY  HEPARIN LEVEL (UNFRACTIONATED)  VITAMIN B12  FOLATE  TECHNOLOGIST SMEAR REVIEW  PHOSPHORUS  HIV ANTIBODY (ROUTINE TESTING)  HEPATITIS C ANTIBODY (REFLEX)  GLUCOSE, CAPILLARY  GLUCOSE, CAPILLARY  GLUCOSE, CAPILLARY  BASIC METABOLIC PANEL  HCV COMMENT:  MAGNESIUM  GLUCOSE, CAPILLARY  GLUCOSE, CAPILLARY  I-STAT CG4 LACTIC ACID, ED  I-STAT CG4 LACTIC ACID, ED  I-STAT TROPOININ, ED  I-STAT CG4 LACTIC ACID, ED  I-STAT CG4 LACTIC ACID, ED  PREPARE FRESH FROZEN PLASMA    Imaging Review No results found. I have personally reviewed and evaluated these images and lab results as part of my medical decision-making.   EKG Interpretation   Date/Time:  Sunday April 12 2015 00:37:53 EDT Ventricular Rate:  95 PR Interval:    QRS Duration: 83 QT Interval:  318 QTC Calculation: 400 R Axis:   68 Text Interpretation:  Atrial fibrillation Low voltage, precordial leads  Baseline wander in lead(s) V3 Confirmed by HORTON  MD, Aragon ([id number]) on  04/12/2015 4:54:36 AM      MDM   Final diagnoses:  Intra-abdominal infection  Septic shock (HCC)  Hyperkalemia  AKI (acute kidney injury) ([place])    Patient hypotensive and tachycardic  on arrival. Code  sepsis initiated with blood and urine cultures drawn. Zosyn started for empiric coverage of likely intra-abdominal infection. CT abdomen and pelvis ordered. 30 mL/kg of IV fluid resuscitation initiated.  Please see Dr. Eliezer Bottom note for further medical decision-making.    Jabe Jeanbaptiste Algernon Huxley, MD 04/22/15 [id number]  Merryl Hacker, MD 04/22/15 [phone]

## 2015-04-12 NOTE — Consult Note (Signed)
Renal Service Consult Note [facility]  William Fischer 04/12/2015 Sol Blazing Requesting Physician:  Dr Lake Bells  Reason for Consult:  Acute renal failure HPI: The patient is a 50 y.o. 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   Was here then Feb 4 - Feb 7 > LLQ and fevers, admit for IV abx. Did not require new drains. Rx IV abx and dc'd.  Improved. Creat was normal 0.76.    Now patient admitted on 3/25 with creat 4.09. Creat down 2.8 today, UOP good today with 2.7 L.  He was hypotensive on admission and with IVF's his BP is now normal and he started to mark urine since BP has come up.  He says " I fell a whole lot better".  There is a question of whether he may need more drains put in.  He got 7L in w IVF since admission and today has made 3.8 L of urine already.      ROS  denies CP  no joint pain   no HA  no blurry vision  no rash  no diarrhea  no nausea/ vomiting  no dysuria  no difficulty voiding  no change in urine color    Past Medical History  Past Medical History  Diagnosis Date  . Chicken pox as a child  . Migraine 06/29/2011  . Obesity 06/29/2011  . Depression with anxiety 06/29/2011  . Atrial fibrillation ([place])     admx 11/13 with acute sCHF in setting of RVR  => a. failed DCCV x 2; b. Pradaxa started;  c. failed sotalol  . Chronic systolic heart failure ([place])     a. echo 11/13: Ef 40-45%, diff HK, mod MR, mod LAE, mild RVE, mod RAE, small effusion;   b. TEE 11/13:  EF 35-40%, no LAA clot; Echo 2/14 shows normal EF  . Cardiomyopathy ([place])     likely tachy mediated in setting of AF with RVR  . Snoring     patient needs sleep study - has declined  . Allergy to IVP dye   . Chronic  anticoagulation     Pradaxa   Past Surgical History  Past Surgical History  Procedure Laterality Date  . Tee without cardioversion  11/30/2011    Procedure: TRANSESOPHAGEAL ECHOCARDIOGRAM (TEE);  Surgeon: Thayer Headings, MD;  Location: [place];  Service: Cardiovascular;  Laterality: N/A;  . Cardioversion  11/30/2011    Procedure: CARDIOVERSION;  Surgeon: Thayer Headings, MD;  Location: [place];  Service: Cardiovascular;  Laterality: N/A;  . Cardioversion  12/03/2011    Procedure: CARDIOVERSION;  Surgeon: Jolaine Artist, MD;  Location: [place];  Service: Cardiovascular;  Laterality: N/A;  . Laparotomy N/A 01/17/2015    Procedure: EXPLORATORY LAPAROTOMY WITH LEFT COLECTOMY AND COLOSTOMY;  Surgeon: Rolm Bookbinder, MD;  Location: [facility];  Service: General;  Laterality: N/A;   Family History  Family History  Problem Relation Age of Onset  . Hypertension Mother   . Hypertension Father   . Aneurysm Father   . Alcohol abuse Father   . Cancer Maternal Grandmother     brain  . Diabetes Maternal Grandfather     type 2  . Parkinsonism Maternal Grandfather   . Cancer Paternal Grandmother  breast  . Diabetes Paternal Grandmother   . Alcohol abuse Paternal Grandfather    Social History  reports that he quit smoking about 9 months ago. His smoking use included Cigarettes. He has a 20 pack-year smoking history. He has never used smokeless tobacco. He reports that he does not drink alcohol or use illicit drugs. Allergies  Allergies  Allergen Reactions  . Contrast Media [Iodinated Diagnostic Agents] Rash    a diffuse macular rash after CTA chest  Pt was premedicated with 125mg  IV Solumedrol, 50mg  IV Benadryl 1 hr prior to CTexam, and tolerated procedure without any difficulties. 11/28/11 Also same pre med protocol observed on 02/22/15 and pt tolerated the procedure well   Home medications Prior to Admission medications   Medication Sig Start Date End Date Taking? Authorizing  Provider  acetaminophen (TYLENOL) 500 MG tablet Take 500-1,000 mg by mouth every 6 (six) hours as needed for mild pain or headache.   Yes Historical Provider, MD  digoxin (LANOXIN) 0.125 MG tablet Take 1 tablet (0.125 mg total) by mouth daily. 03/23/15  Yes Sherran Needs, NP  diltiazem (CARDIZEM CD) 120 MG 24 hr capsule Take 1 capsule (120 mg total) by mouth daily. 03/23/15  Yes Sherran Needs, NP  diphenhydrAMINE (BENADRYL) 25 mg capsule Take 2 capsules (50 mg total) by mouth once. Patient taking differently: Take 50 mg by mouth as needed (for imaging scanes).  03/17/15  Yes Aletta Edouard, MD  feeding supplement (BOOST / RESOURCE BREEZE) LIQD Take 1 Container by mouth 3 (three) times daily between meals. 02/02/15  Yes Liberty Handy, MD  furosemide (LASIX) 40 MG tablet Take 1 tablet (40 mg total) by mouth daily. 03/23/15  Yes Sherran Needs, NP  lisinopril (PRINIVIL,ZESTRIL) 5 MG tablet Take 1 tablet (5 mg total) by mouth daily. 02/25/15  Yes Thompson Grayer, MD  Metoprolol Tartrate 75 MG TABS Take 150 mg by mouth 2 (two) times daily. 03/12/15  Yes Historical Provider, MD  oxyCODONE (OXY IR/ROXICODONE) 5 MG immediate release tablet Take 1-2 tablets (5-10 mg total) by mouth every 4 (four) hours as needed for moderate pain. 02/24/15  Yes Emina Riebock, NP  warfarin (COUMADIN) 5 MG tablet Take as directed by [facility] Patient taking differently: Take 5 mg by mouth daily.  03/09/15  Yes Thompson Grayer, MD  ciprofloxacin (CIPRO) 500 MG tablet Take 1 tablet (500 mg total) by mouth 2 (two) times daily. Patient not taking: Reported on 04/12/2015 02/24/15   Erby Pian, NP  metoprolol (LOPRESSOR) 100 MG tablet Take 1 tablet (100 mg total) by mouth 2 (two) times daily. Patient not taking: Reported on 04/12/2015 03/23/15   Sherran Needs, NP   Liver Function Tests  Recent Labs Lab 04/11/15 2358  AST 22  ALT 44  ALKPHOS 86  BILITOT 0.4  PROT 8.4*  ALBUMIN 2.9*    Recent Labs Lab 04/12/15 0210  LIPASE 24    CBC  Recent Labs Lab 04/11/15 2358 04/12/15 0237  WBC 21.9*  --   NEUTROABS 17.3*  --   HGB 11.5* 11.9*  HCT 36.2* 35.0*  MCV 93.3  --   PLT 619*  --    Basic Metabolic Panel  Recent Labs Lab 04/11/15 2358 04/12/15 0237 04/12/15 0403  NA 128* 126* 130*  K 6.6* 6.4* 5.8*  CL 92* 94* 100*  CO2 22  --  20*  GLUCOSE 128* 225* 183*  BUN 51* 52* 49*  CREATININE 4.09* 4.20* 4.14*  CALCIUM 10.3  --  8.6*    Filed Vitals:   04/12/15 0545 04/12/15 0600 04/12/15 0730 04/12/15 0830  BP: 93/47 101/55 104/66 103/55  Pulse: 88 111 103 98  Temp:      TempSrc:      Resp: 23 28 21 25   Height:      Weight:      SpO2: 98% 99% 99% 98%   Exam Pleasant man, no distress, calm No rash, cyanosis or gangrene Sclera anicteric, throat clear, slightly moist  No jvd or bruits Chest clear bilat Irreg rate and rhythm, +SEM no RG Abd soft ntnd no mass or ascites, RLQ ostomy w liquid brown stool  GU normal male, no foley MS no joint effusions or deformity Ext no LE or UE edema / no wounds or ulcers Neuro is alert, Ox 3 , nf   EKG > afib, low voltage, no acute change CXR 3/26 #1 > LLL opacity, no edema CXR 3/26 #2 > LLL opacity, no change CT abd/ pelv >  1) 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 Acute renal failure- prob functional from severe hypotension from vol depletion and/or sepsis. UOP very good with BP up now, creat improving.  UA negative, urine Na low c/w prerenal.  Agree with IVF bicarb drip as  you're doing.  Should continue to improve. No prior hx of renal failure.  2. Chron afib 3. Shock - poss sepsis, [place].  Improved, on emp abx. 4. Hx ruptured diverticulitis w dehisced rectal stump and mult fluid collections w indwelling drain - per surgery/ IR  Plan - as above, will follow.   Kelly Splinter MD [employer] pager [phone]    cell [phone] 04/12/2015, 9:31 AM

## 2015-04-12 NOTE — Progress Notes (Addendum)
Patient ID: William Fischer, male   DOB: [birth year], 50 y.o.   MRN: [id number] Seen by Dr Redmond Pulling this am for admission. I have been following as outpatient. He underwent left colectomy for what appeared to be ischemic disease several months ago while on anticoagulation.  This was not surprisingly complicated by a large abscess as this whole left side was dead including soft tissue.  He has three drains in now and I thought those were finally going to manage these collections.  I did sbft last week to make sure there wasn't any communication as well.  Considered gg enema also but I dont think that is source at this point.  He presents ill with ari today.  He is being resuscitated and nephrology to see.  Is on abx as he needs and I have discussed further drainage with Dr Pascal Lux who does think he can place another drain.  I think fluid is just considerable and thick although all drains are functional. On drain study last time there was no other communication. I do not think reoperation is indicated still. I think risks outweigh any benefits of that at this point and would like create a much more complicated picture.  I also think ID consult tomorrow for longer term abx. I had stopped them when he was doing better and appeared we had them drained adequately.  Clearly this was not best decision.  He and his family are frustrated with care at this point. I have offered him second opinion as outpatient again today but I do not think anything else indicated outside of additional drainage and id consult with abx.   Needs coumadin held. Inr high now which may be up in part due to dehydration. His initial issues was likely ischemic disease while on anticoagulation though. I dont want to reverse unless needed.  Will just hold and recheck in am prior to drainage. If inr becomes normal will likely need heparin gtt and do procedure through heparin window

## 2015-04-12 NOTE — Progress Notes (Addendum)
Pharmacy Antibiotic Note  William Fischer is a 50 year old male s/p exploratory laparotomy, left colectomy, end colostomy for ischemic bowel on [id number] complicated by post op intra-abdominal abscesses and rectal stump leak withprolonged hospitalization. Pt continues with perc drains. Pt reports that one drain continues to fill up with pus. Pharmacy has been consulted for Zosyn dosing.  Plan: Zosyn 3.375gm IV now over 30 min then 3.375gm IV q8h - subsequent doses over 4 hours Will f/u micro data, renal function, and pt's clinical condition   Height: 5\' 10"  (177.8 cm) Weight: 235 lb (106.595 kg) IBW/kg (Calculated) : 73  Temp (24hrs), Avg:98.4 F (36.9 C), Min:98 F (36.7 C), Max:98.7 F (37.1 C)   Recent Labs Lab 04/11/15 2358 04/12/15 0033 04/12/15 0237 04/12/15 0238 04/12/15 0414  WBC 21.9*  --   --   --   --   CREATININE 4.09*  --  4.20*  --   --   LATICACIDVEN  --  1.82  --  1.79 1.54    Estimated Creatinine Clearance: 26.6 mL/min (by C-G formula based on Cr of 4.2).    Allergies  Allergen Reactions  . Contrast Media [Iodinated Diagnostic Agents] Rash    a diffuse macular rash after CTA chest  Pt was premedicated with 125mg  IV Solumedrol, 50mg  IV Benadryl 1 hr prior to CTexam, and tolerated procedure without any difficulties. 11/28/11 Also same pre med protocol observed on 02/22/15 and pt tolerated the procedure well    Antimicrobials this admission: 3/26 Zosyn >>   Microbiology results: 3/25 BCx:  3/26 UCx:     Thank you for allowing pharmacy to be a part of this patient's care.  Sherlon Handing, PharmD, BCPS Clinical pharmacist, pager [phone] 04/12/2015 5:39 AM     Pharmacy Code Sepsis Protocol  Time of code sepsis page: 0122 3/26 [x]  RN pulled from pyxis right after code called   Were antibiotics ordered at the time of the code sepsis page? Yes Was it required to contact the physician? [x]  Physician not contacted []  Physician contacted to order  antibiotics for code sepsis []  Physician contacted to recommend changing antibiotics  Pharmacy consulted for: Zosyn  Anti-infectives    Start     Dose/Rate Route Frequency Ordered Stop   04/12/15 0130  piperacillin-tazobactam (ZOSYN) IVPB 3.375 g     3.375 g 100 mL/hr over 30 Minutes Intravenous  Once 04/12/15 0115          Nurse education provided - no education needed   Sindy Guadeloupe, PharmD 04/12/2015, 1:22 AM

## 2015-04-12 NOTE — H&P (Signed)
Chief Complaint: Abdominal pain  Referring Physician(s): Donne Hazel  Supervising Physician: Sandi Mariscal  History of Present Illness: William Fischer is a 50 y.o. male who is well known to our service.  He is a patient of Dr. 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   Follow-up imaging on 01/28/15 revealed a rectal stump blowout with subsequent pelvic abscess development.   He underwent CT-guided drainage of a left pelvic abscess on 01/29/15 which revealed feculent material. Cultures grew Escherichia coli.   On 03/02/15 the left abdominal drain was exchanged secondary to partial dislodgment.   Due to persistent leaking around the drainage catheter the patient had subsequent conversion of the existing drain to 2 biliary drainage caths in addition to an APD on 03/03/15.  He was on chronic ciprofloxacin for 3-4 months and this was stopped 4 weeks ago.   Over the last week he has noted increased LLQ abdominal pain with bloating, n/v and profuse ostomy output. He was febrile at home to 102.  CT scan shows a left subdiaphragmatic abscess measuring 4.4 cm, slightly larger than on previous study. Abscess continues down along the posterior paraspinous muscles to the left psoas muscle consistent with psoas abscess. Appearance is similar to previous study. 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  We are asked to place another drain.  He takes coumadin and his INR is 2.7 today  Past Medical History  Diagnosis Date  . Chicken pox as a child  . Migraine 06/29/2011  . Obesity 06/29/2011  . Depression with anxiety 06/29/2011  . Atrial fibrillation ([place])     admx 11/13 with acute sCHF in setting of RVR  => a. failed DCCV x 2; b. Pradaxa started;  c. failed sotalol  . Chronic systolic heart failure ([place])     a. echo 11/13: Ef 40-45%, diff HK, mod MR, mod LAE,  mild RVE, mod RAE, small effusion;   b. TEE 11/13:  EF 35-40%, no LAA clot; Echo 2/14 shows normal EF  . 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 Snoring     patient needs sleep study - has declined  . Allergy to IVP dye   . Chronic anticoagulation     Pradaxa    Past Surgical History  Procedure Laterality Date  . Tee without cardioversion  11/30/2011    Procedure: TRANSESOPHAGEAL ECHOCARDIOGRAM (TEE);  Surgeon: Thayer Headings, MD;  Location: [place];  Service: Cardiovascular;  Laterality: N/A;  . Cardioversion  11/30/2011    Procedure: CARDIOVERSION;  Surgeon: Thayer Headings, MD;  Location: [place];  Service: Cardiovascular;  Laterality: N/A;  . Cardioversion  12/03/2011    Procedure: CARDIOVERSION;  Surgeon: Jolaine Artist, MD;  Location: [place];  Service: Cardiovascular;  Laterality: N/A;  . Laparotomy N/A 01/17/2015    Procedure: EXPLORATORY LAPAROTOMY WITH LEFT COLECTOMY AND COLOSTOMY;  Surgeon: Rolm Bookbinder, MD;  Location: [place];  Service: General;  Laterality: N/A;    Allergies: Contrast media  Medications: Prior to Admission medications   Medication Sig Start Date End Date Taking? Authorizing Provider  acetaminophen (TYLENOL) 500 MG tablet Take 500-1,000 mg by mouth every 6 (six) hours as needed for mild pain or headache.   Yes Historical Provider, MD  digoxin (LANOXIN) 0.125 MG tablet Take 1 tablet (0.125 mg total) by mouth daily. 03/23/15  Yes  Sherran Needs, NP  diltiazem (CARDIZEM CD) 120 MG 24 hr capsule Take 1 capsule (120 mg total) by mouth daily. 03/23/15  Yes Sherran Needs, NP  diphenhydrAMINE (BENADRYL) 25 mg capsule Take 2 capsules (50 mg total) by mouth once. Patient taking differently: Take 50 mg by mouth as needed (for imaging scanes).  03/17/15  Yes Aletta Edouard, MD  feeding supplement (BOOST / RESOURCE BREEZE) LIQD Take 1 Container by mouth 3 (three) times daily between meals. 02/02/15  Yes Liberty Handy, MD    furosemide (LASIX) 40 MG tablet Take 1 tablet (40 mg total) by mouth daily. 03/23/15  Yes Sherran Needs, NP  lisinopril (PRINIVIL,ZESTRIL) 5 MG tablet Take 1 tablet (5 mg total) by mouth daily. 02/25/15  Yes Thompson Grayer, MD  Metoprolol Tartrate 75 MG TABS Take 150 mg by mouth 2 (two) times daily. 03/12/15  Yes Historical Provider, MD  oxyCODONE (OXY IR/ROXICODONE) 5 MG immediate release tablet Take 1-2 tablets (5-10 mg total) by mouth every 4 (four) hours as needed for moderate pain. 02/24/15  Yes Emina Riebock, NP  warfarin (COUMADIN) 5 MG tablet Take as directed by [facility] Patient taking differently: Take 5 mg by mouth daily.  03/09/15  Yes Thompson Grayer, MD  ciprofloxacin (CIPRO) 500 MG tablet Take 1 tablet (500 mg total) by mouth 2 (two) times daily. Patient not taking: Reported on 04/12/2015 02/24/15   Erby Pian, NP  metoprolol (LOPRESSOR) 100 MG tablet Take 1 tablet (100 mg total) by mouth 2 (two) times daily. Patient not taking: Reported on 04/12/2015 03/23/15   Sherran Needs, NP     Family History  Problem Relation Age of Onset  . Hypertension Mother   . Hypertension Father   . Aneurysm Father   . Alcohol abuse Father   . Cancer Maternal Grandmother     brain  . Diabetes Maternal Grandfather     type 2  . Parkinsonism Maternal Grandfather   . Cancer Paternal Grandmother     breast  . Diabetes Paternal Grandmother   . Alcohol abuse Paternal Grandfather     Social History   Social History  . Marital Status: Divorced    Spouse Name: N/A  . Number of Children: N/A  . Years of Education: N/A   Social History Main Topics  . Smoking status: Former Smoker -- 1.00 packs/day for 20 years    Types: Cigarettes    Quit date: 07/10/2014  . Smokeless tobacco: Never Used  . Alcohol Use: No  . Drug Use: No  . Sexual Activity: No   Other Topics Concern  . None   Social History Narrative    Review of Systems: A 12 point ROS discussed and pertinent positives are indicated  in the HPI above.  All other systems are negative.  Review of Systems  Constitutional: Positive for fever, chills, activity change, appetite change and fatigue.  HENT: Negative.   Respiratory: Negative for cough and shortness of breath.   Gastrointestinal: Positive for nausea and abdominal pain. Negative for vomiting.  Genitourinary: Negative.   Musculoskeletal: Negative.   Skin: Negative.   Neurological: Negative.   Psychiatric/Behavioral: Negative.     Vital Signs: BP 103/55 mmHg  Pulse 98  Temp(Src) 98 F (36.7 C) (Oral)  Resp 25  Ht 5\' 10"  (1.778 m)  Wt 235 lb (106.595 kg)  BMI 33.72 kg/m2  SpO2 98%  Physical Exam  Constitutional: He is oriented to person, place, and time. He appears well-developed and well-nourished.  HENT:  Head: Normocephalic and atraumatic.  Eyes: EOM are normal.  Neck: Normal range of motion. Neck supple.  Cardiovascular: Normal rate, regular rhythm and normal heart sounds.   Pulmonary/Chest: Effort normal and breath sounds normal. He has no wheezes.  Abdominal: Soft. Bowel sounds are normal. He exhibits no distension. There is tenderness.  3 drains in place. Tan purulent drainage in each gravity bag.  Neurological: He is alert and oriented to person, place, and time.  Psychiatric: He has a normal mood and affect. His behavior is normal. Judgment and thought content normal.  Vitals reviewed.   Mallampati Score:  MD Evaluation Airway: WNL Heart: WNL Abdomen: Other (comments) Abdomen comments: Drains in place, midline incision Chest/ Lungs: WNL ASA  Classification: 2 Mallampati/Airway Score: Two  Imaging: Ct Abdomen Pelvis Wo Contrast  04/12/2015  CLINICAL DATA:  Constant ongoing abdominal pain and left flank pain, worsening over the last week. Fever, shortness of breath, and generalized weakness. EXAM: CT ABDOMEN AND PELVIS WITHOUT CONTRAST TECHNIQUE: Multidetector CT imaging of the abdomen and pelvis was performed following the standard  protocol without IV contrast. COMPARISON:  03/26/2015 FINDINGS: Small left pleural effusion with consolidation in the left base possibly pneumonia. Sub cm low-attenuation lesion in the posterior right lobe of the liver similar to prior study. Gallbladder, pancreas, spleen, adrenal glands, kidneys, abdominal aorta, inferior vena cava, and retroperitoneal lymph nodes are unremarkable. There is a loculated fluid collection under the left hemidiaphragm and posterior to the spleen measuring 4.4 cm maximal diameter. This likely represents a residual or recurrent abscess. Similar collection was seen in this location previously, slightly smaller previously. This fluid collection extends inferiorly along the posterior psoas muscle consistent with psoas abscess. A left lower quadrant percutaneous drainage catheter is placed into the left anterior pericolic gutter. There is stranding in this area without discrete residual collection. 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 An additional pigtail drainage catheter is present in the left lower quadrant without residual collection. No free air or free fluid in the abdomen. New right abdominal transverse colostomy. Stomach, small bowel, and colon are not abnormally distended. Broad-based anterior abdominal wall hernia containing fat. Pelvis: Prostate gland is enlarged. Bladder is decompressed and cannot be evaluated. No evidence of diverticulitis. No free or loculated pelvic fluid collections. No destructive bone lesions. IMPRESSION: There is a left subdiaphragmatic abscess measuring 4.4 cm, slightly larger than on previous study. Abscess continues down along the posterior paraspinous muscles to the left psoas muscle consistent with psoas abscess. Appearance is similar to previous study. 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 Right  abdominal transverse colostomy. Electronically Signed   By: Lucienne Capers M.D.   On: 04/12/2015 03:04   Ct Abdomen Pelvis W Contrast  03/26/2015  CLINICAL DATA:  History of left-sided colectomy and end colostomy for perforated ischemic colitis on 0000000 complicated by development of a rectal stump leak, post CT-guided percutaneous drainage catheter placement on 01/29/2015. Due to persistent leaking around the percutaneous drainage catheter, the patient underwent a successful fluoroscopic guided percutaneous drainage catheter repositioning on 03/02/2015 with subsequent percutaneous drainage catheter exchange, repositioning and placements on 03/12/2015. The patient returns to the [facility]iology Clinic today for percutaneous drainage catheter management. The patient continues to flush all 3 existing percutaneous drainage catheters. The patient states that he is experiencing nearly 400 cc of purulent output one of the  percutaneous drainage catheters per day, with bearing output from the other 2 percutaneous drainage catheters. Patient states he continues to experience a minimal amount of purulent discharge surrounding the entrance site of the 3 percutaneous drainage catheter. EXAM: CT ABDOMEN AND PELVIS WITH CONTRAST TECHNIQUE: Multidetector CT imaging of the abdomen and pelvis was performed using the standard protocol following bolus administration of intravenous contrast. CONTRAST:  186mL ISOVUE-300 IOPAMIDOL (ISOVUE-300) INJECTION 61% COMPARISON:  CT abdomen pelvis - 01/28/2015; 02/12/2015; 02/22/2015; 03/12/2015; 03/26/2015; CT-guided percutaneous drainage catheter placement - 01/29/2015; fluoroscopic guided percutaneous drainage catheter injection - 03/12/2015; 03/02/2015; fluoroscopic guided percutaneous drainage catheter injection and placement - 03/03/2015 FINDINGS: Stable sequela of left hemicolectomy with end colostomy. Grossly unchanged approximately 3.9 x 3.7 cm mesenteric fat containing  peristomal hernia. Moderate colonic stool burden without evidence of enteric obstruction. Normal appearance of the terminal ileum and retrocecal appendix. No pneumoperitoneum, pneumatosis or portal venous gas. Unchanged positioning of 3 percutaneous drainage catheters all of which enter from the same entrance site within in the anterior lateral aspect of the left abdomen. There has been interval resolution of previously noted scattered loculated foci of subcutaneous emphysema about the left pericolic gutter. The approximately 3.2 x 2.0 cm peripherally enhancing fluid collection about the cranial aspect of the tail of the spleen pancreas is grossly unchanged measuring 2.0 x 3.2 cm (coronal image 68, series 601). The peripherally enhancing fluid collection adjacent to the the lateral aspect of the left iliopsoas muscle is unchanged to minimally decreased in size in interval, currently measuring 2.7 x 5.0 x 10.1 cm (image 44, series 3; sagittal image 124, series 602) previously, 2.7 x 5.6 x 11.6 cm. The air and fluid containing collection within the left upper abdominal quadrant adjacent to the medial aspect of the spleen is grossly unchanged, measuring 3.2 x 4.4 cm (representative image 21, series 3). No new definable/drainable fluid collections within the abdomen or pelvis. ___________________________________________________________________ Normal hepatic contour. Unchanged punctate (approximately 0.7 cm) hypoattenuating lesion within the dome of the right lobe of the liver, grossly unchanged since the 12/1928 01/2014 examination while too small to adequately characterize is favored to represent a hepatic cyst. Normal appearance of the gallbladder given degree distention. No radiopaque gallstones. No intra extrahepatic of the duct dilatation. No ascites. There is symmetric enhancement of the bilateral kidneys. No definite renal stones on this postcontrast examination. No discrete renal lesions. No urinary  obstruction. Normal appearance of the bilateral adrenal glands, pancreas and spleen. Normal caliber the abdominal aorta. The major branch vessels of the abdominal aorta appear patent on this non CTA examination. Scattered retroperitoneal porta hepatis lymph nodes are individually not enlarged by size criteria. No retroperitoneal, mesenteric, pelvic or inguinal lymphadenopathy. Normal appearance of the pelvic organs. Normal appearance of the urinary bladder given degree distention. No free fluid in the pelvic cul-de-sac. Limited visualization of lower thorax demonstrates grossly unchanged small left-sided pleural effusion with associated subsegmental atelectasis within in the left lower lobe with associated air bronchograms, unchanged. No new focal airspace opacities. Normal heart size.  No pericardial effusion. No acute or aggressive osseous abnormalities. Open midline abdominal wound, unchanged. There is a minimal on a subcutaneous edema, most conspicuous about the midline of the low back. IMPRESSION: 1. Unchanged positioning of 3 percutaneous drainage catheters with no change significant change in the complex residual fluid collections located within the left upper abdominal quadrant and about the anterior lateral aspect of the left iliopsoas muscle as well as adjacent to the medial aspect of the  spleen are grossly unchanged since the 02/2015 examination. No new or enlarging intra-abdominal fluid collections. 2. Stable sequela of left colectomy and end colostomy creation without evidence of enteric obstruction. 3. Unchanged small left-sided effusion with associated left basilar opacities and air bronchograms, atelectasis versus infiltrate. Electronically Signed   By: Sandi Mariscal M.D.   On: 03/26/2015 12:44   Dg Small Bowel  04/06/2015  CLINICAL DATA:  50 year old male status post left-sided colectomy and end colostomy for perforated ischemic colitis on 0000000 complicated by development of a rectal stump  leak, post CT-guided percutaneous drainage catheter placement on 01/29/2015. Due to persistent leaking around the percutaneous drainage catheter, the patient underwent a successful fluoroscopic guided percutaneous drainage catheter repositioning on 03/02/2015 with subsequent percutaneous drainage catheter exchange, repositioning and placements on 03/12/2015. Patient presents for small bowel follow-through today trying to ascertain the source of continued high volume drain output. Recent drain injection on 03/26/2015. EXAM: SMALL BOWEL SERIES COMPARISON:  03/26/2015 and earlier. TECHNIQUE: Following ingestion of thin barium, serial small bowel images were obtained including spot views of the terminal ileum. FLUOROSCOPY TIME:  Radiation Exposure Index (as provided by the fluoroscopic device): 75 dGy If the device does not provide the exposure index: Fluoroscopy Time (in minutes and seconds):  1 minutes 0 seconds FINDINGS: Preprocedural scout view of the abdomen demonstrating 3 pigtail type percutaneous drains in the left mid and lower abdomen stable in configuration since 03/26/2015. Normal bowel gas pattern. Superimposed midline catheter which pertains to an external wound VAC. Single contrast small bowel follow-through was undertaken and the patient tolerated this well and without difficulty. After the first cup of barium contrast a KUB is obtained demonstrating normal opacification of the stomach and proximal small bowel loops. Normal midgut rotation. Oral contrast has reached left abdominal small bowel in proximity to the abdominal drains no contrast identified within the drains. The patient was then followed with serial overhead radiographs while he drank 2 more cups of oral contrast. Contrast transit time to the residual colon is about 1 hour. On all of these images no barium uptake by the drains is identified. Fluoroscopic evaluation of the small bowel and left abdominal drains was then performed. The loops in  proximity to the drains appear normal in course caliber and mucosal pattern. No contrast uptake by the drains occurred. The terminal ileum also is normal. IMPRESSION: Negative small bowel follow-through. Small bowel loops in proximity to the left abdominal drains appear normal with no evidence of enteric fistula. Electronically Signed   By: Genevie Ann M.D.   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 Due to persistent leaking around the percutaneous drainage catheter, the patient underwent a successful fluoroscopic guided percutaneous drainage catheter repositioning on 03/02/2015 with subsequent percutaneous drainage catheter exchange, repositioning and placements on 03/12/2015. The patient returns to the Interventional Radiology Clinic today for percutaneous drainage catheter management. The patient continues to flush all 3 existing percutaneous drainage catheters. The patient states that he is experiencing nearly 400 cc of purulent output one of the percutaneous drainage catheters per day, with bearing output from the other 2 percutaneous drainage catheters. Patient states he continues to experience a minimal amount of purulent discharge surrounding the entrance site of the 3 percutaneous drainage catheter. EXAM: ABSCESS INJECTION COMPARISON:  CT abdomen pelvis - earlier same day ; 01/17/2015; fluoroscopic guided percutaneous drainage catheter exchange - 03/02/2015; fluoroscopic guided percutaneous drainage catheter exchange, repositioning and placement - 03/03/2015; fluoroscopic guided percutaneous drainage catheter injection - 03/11/2014 CONTRAST:  20 cc Omnipaque 300, administered via the existing percutaneous drainage catheter FLUOROSCOPY TIME:  30 seconds  (45 dGy) TECHNIQUE: The patient was positioned supine on the fluoroscopy table. A preprocedural spot fluoroscopic image was obtained of the left mid hemi abdomen existing percutaneous drainage catheters. Several spot fluoroscopic and radiographic images were obtained from the injection of a small amount of contrast via the middle percutaneous drainage catheter (the biliary drainage catheter) as multiple spot fluoroscopic and radiographic images were obtained. Images were reviewed and the procedure was terminated. The drainage catheters reconnected to a gravity bag. A dressing was placed. The patient tolerated the procedure well without immediate postprocedural complication. FINDINGS: Fluoroscopic guided injection of the middle percutaneous drainage catheter (the biliary drainage catheter) demonstrates opacification of the known abscess within the left upper abdominal quadrant adjacent to the medial aspect of the spleen. There is minimal opacification along the mid in caudal aspects of the drainage catheter without definitive opacification of the node fluid collection about the left iliopsoas musculature. Excreted contrast is seen within in the left renal collecting system, ureter or urinary bladder. IMPRESSION: Fluoroscopic guided injection of the middle percutaneous drainage catheter (the biliary drainage catheter) demonstrates communication with the cranial aspect of the drain and the known residual abscess within the left upper abdominal quadrant adjacent to the medial aspect of the spleen, grossly unchanged compared to fluoroscopic guided injection performed 03/12/2015. There is no definitive opacification of the known abscess/fluid collection adjacent left iliopsoas musculature. Electronically Signed   By: Sandi Mariscal M.D.   On: 03/26/2015 12:52   Dg Chest Portable 1 View  04/12/2015  CLINICAL DATA:  Central line placement EXAM: PORTABLE CHEST 1 VIEW COMPARISON:  04/12/2015. FINDINGS: RIGHT IJ catheter tip  cavoatrial junction. No pneumothorax. Cardiomegaly. Developing edema. IMPRESSION: RIGHT IJ catheter tip cavoatrial junction. No pneumothorax. Worsening aeration. Electronically Signed   By: Staci Righter M.D.   On: 04/12/2015 07:03   Dg Chest Port 1 View  04/12/2015  CLINICAL DATA:  Fever. EXAM: PORTABLE CHEST 1 VIEW COMPARISON:  01/29/2015 FINDINGS: Cardiac enlargement without vascular congestion or edema. Slight atelectasis in the left lung base. Improvement of the lung bases since previous study. Can't exclude small left pleural effusion. No pneumothorax. Mediastinal contours appear intact. IMPRESSION: Cardiac enlargement. Atelectasis and possible small effusion on the left base. Improvement since previous study. Electronically Signed   By: Lucienne Capers M.D.   On: 04/12/2015 00:40    Labs:  CBC:  Recent Labs  02/21/15 2313 02/23/15 0922 02/24/15 0528 04/11/15 2358 04/12/15 0237  WBC 9.9 7.1 10.4 21.9*  --   HGB 11.3* 11.1* 11.0* 11.5* 11.9*  HCT 34.3* 34.2* 34.6* 36.2* 35.0*  PLT 451* 497* 416* 619*  --     COAGS:  Recent Labs  03/25/15 04/02/15 04/08/15 04/11/15 2358  INR 4.7 4.4 4.0 2.76*    BMP:  Recent Labs  02/21/15 2313 02/23/15 0922 04/11/15 2358 04/12/15 0237 04/12/15 0403  NA 138 139 128* 126* 130*  K 4.2 4.2 6.6* 6.4* 5.8*  CL 100* 101 92* 94* 100*  CO2 26 27 22   --  20*  GLUCOSE 115* 155* 128* 225* 183*  BUN 12 9 51* 52* 49*  CALCIUM 8.5* 8.8* 10.3  --  8.6*  CREATININE 0.85 0.76  4.09* 4.20* 4.14*  GFRNONAA >60 >60 16*  --  16*  GFRAA >60 >60 19*  --  18*    LIVER FUNCTION TESTS:  Recent Labs  01/19/15 0445 02/14/15 0938 02/21/15 2313 04/11/15 2358  BILITOT 1.1 0.4 0.4 0.4  AST 47* 22 30 22   ALT 26 40 42 44  ALKPHOS 67 68 57 86  PROT 4.9* 7.0 6.6 8.4*  ALBUMIN 1.4* 2.2* 2.0* 2.9*    TUMOR MARKERS: No results for input(s): AFPTM, CEA, CA199, CHROMGRNA in the last 8760 hours.  Assessment and Plan:  Left subdiaphragmatic abscess  measuring 4.4 cm which continues down along the posterior paraspinous muscles to the left psoas muscle consistent with psoas abscess. Appearance is similar to previous study. 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  After long discussion with him, he tells me he is "frustrated" and that "this is ridiculous. Something needs to be done to stop this. I need to talk to Dr. Donne Hazel and see what's going on..I'm NOT getting another drain! I already have 3!"  Will proceed with image guided placement of another drain when INR is at or below 1.5 and patient is agreeable.  NO URGENT need to correct INR. Recommend admit and allow to trend down.   Electronically Signed: Murrell Redden PA-C 04/12/2015, 10:29 AM   I spent a total of  15 Minutes in face to face in clinical consultation, greater than 50% of which was counseling/coordinating care for image guided drain placement.

## 2015-04-12 NOTE — Progress Notes (Signed)
[facility] Progress Note Patient Name: William Fischer DOB: 04-Feb-1965 MRN: [id number]   Date of Service  04/12/2015  HPI/Events of Note  Screens for c diff neg  eICU Interventions  D/c isolation      Intervention Category Minor Interventions: Routine modifications to care plan (e.g. PRN medications for pain, fever)  Christinia Gully 04/12/2015, 9:01 PM

## 2015-04-12 NOTE — Progress Notes (Signed)
Pharmacy Antibiotic Note  William Fischer is a 50 year old male s/p exploratory laparotomy, left colectomy, end colostomy for ischemic bowel on [id number] complicated by post op intra-abdominal abscesses and rectal stump leak withprolonged hospitalization. Pt continues with perc drains. Pt reports that one drain continues to fill up with pus. Pharmacy has been consulted for Zosyn and Vancomycin dosing. Note he is in acute renal failure.  His clearance is currently adequate for extended infusion dosing but if his SCr continues to increase his regimen may need adjustment. He received Vancomycin 1g at 0530 in the ED.  Plan: Give Vancomycin 1.5g IV x 1 now to complete his load, then 1500mg  IV q48h Zosyn 3.375gm IV q8h extended infusion Will f/u micro data, renal function, and pt's clinical condition   Height: 5\' 10"  (177.8 cm) Weight: 235 lb (106.595 kg) IBW/kg (Calculated) : 73  Temp (24hrs), Avg:98.1 F (36.7 C), Min:97.7 F (36.5 C), Max:98.7 F (37.1 C)   Recent Labs Lab 04/11/15 2358 04/12/15 0033 04/12/15 0237 04/12/15 0238 04/12/15 0403 04/12/15 0414 04/12/15 1430  WBC 21.9*  --   --   --   --   --  15.7*  CREATININE 4.09*  --  4.20*  --  4.14*  --   --   LATICACIDVEN  --  1.82  --  1.79  --  1.54  --     Estimated Creatinine Clearance: 27 mL/min (by C-G formula based on Cr of 4.14).    Allergies  Allergen Reactions  . Contrast Media [Iodinated Diagnostic Agents] Rash    a diffuse macular rash after CTA chest  Pt was premedicated with 125mg  IV Solumedrol, 50mg  IV Benadryl 1 hr prior to CTexam, and tolerated procedure without any difficulties. 11/28/11 Also same pre med protocol observed on 02/22/15 and pt tolerated the procedure well    Antimicrobials this admission: 3/26 Zosyn >>  3/26 Vanc >> 3/26 Eraxis >>  Microbiology results: 3/25 BCx:  3/26 UCx:   3/26 Cdiff neg   Thank you for allowing pharmacy to be a part of this patient's care.  Norva Riffle, PharmD 04/12/2015, 3:24 PM

## 2015-04-12 NOTE — ED Notes (Signed)
Critical lab reported to [facility] MD

## 2015-04-12 NOTE — H&P (Signed)
PULMONARY / CRITICAL CARE MEDICINE   Name: William Fischer MRN: [id number] DOB: 09/11/1965    ADMISSION DATE:  04/12/2015 CHIEF COMPLAINT:  Fevers abdominal pain  HISTORY OF PRESENT ILLNESS:   50 yo male with ischemic bowel disease who underwent Ex-lap with left, colectomy, with end colostomy which was complicated by intra-abdominal abscess and rectal stump leak in December of 2016.  Intra-abdominal drains were placed.  He has had multiple hospitalization from intra-abdominal complications.  He was on chronic ciprofloxacin for 3-4 months and this was stopped 4 weeks ago.  Over the last week he has noted increased LLQ abdominal pain with bloating, n/v and profuse ostomy output.  He was febrile at home to 102.  PAST MEDICAL HISTORY :   has a past medical history of Chicken pox (as a child); Migraine (06/29/2011); Obesity (06/29/2011); Depression with anxiety (06/29/2011); Atrial fibrillation ([place]); Chronic systolic heart failure ([place]); Cardiomyopathy ([place]); Snoring; Allergy to IVP dye; and Chronic anticoagulation.  has past surgical history that includes TEE without cardioversion (11/30/2011); Cardioversion (11/30/2011); Cardioversion (12/03/2011); and laparotomy (N/A, 01/17/2015). Prior to Admission medications   Medication Sig Start Date End Date Taking? Authorizing Provider  acetaminophen (TYLENOL) 500 MG tablet Take 500-1,000 mg by mouth every 6 (six) hours as needed for mild pain or headache.   Yes Historical Provider, MD  digoxin (LANOXIN) 0.125 MG tablet Take 1 tablet (0.125 mg total) by mouth daily. 03/23/15  Yes Sherran Needs, NP  diltiazem (CARDIZEM CD) 120 MG 24 hr capsule Take 1 capsule (120 mg total) by mouth daily. 03/23/15  Yes Sherran Needs, NP  diphenhydrAMINE (BENADRYL) 25 mg capsule Take 2 capsules (50 mg total) by mouth once. Patient taking differently: Take 50 mg by mouth as needed (for imaging scanes).  03/17/15  Yes Aletta Edouard, MD  feeding supplement (BOOST / RESOURCE BREEZE)  LIQD Take 1 Container by mouth 3 (three) times daily between meals. 02/02/15  Yes Liberty Handy, MD  furosemide (LASIX) 40 MG tablet Take 1 tablet (40 mg total) by mouth daily. 03/23/15  Yes Sherran Needs, NP  lisinopril (PRINIVIL,ZESTRIL) 5 MG tablet Take 1 tablet (5 mg total) by mouth daily. 02/25/15  Yes Thompson Grayer, MD  Metoprolol Tartrate 75 MG TABS Take 150 mg by mouth 2 (two) times daily. 03/12/15  Yes Historical Provider, MD  oxyCODONE (OXY IR/ROXICODONE) 5 MG immediate release tablet Take 1-2 tablets (5-10 mg total) by mouth every 4 (four) hours as needed for moderate pain. 02/24/15  Yes Emina Riebock, NP  warfarin (COUMADIN) 5 MG tablet Take as directed by [facility] Patient taking differently: Take 5 mg by mouth daily.  03/09/15  Yes Thompson Grayer, MD  ciprofloxacin (CIPRO) 500 MG tablet Take 1 tablet (500 mg total) by mouth 2 (two) times daily. Patient not taking: Reported on 04/12/2015 02/24/15   Erby Pian, NP  metoprolol (LOPRESSOR) 100 MG tablet Take 1 tablet (100 mg total) by mouth 2 (two) times daily. Patient not taking: Reported on 04/12/2015 03/23/15   Sherran Needs, NP   Allergies  Allergen Reactions  . Contrast Media [Iodinated Diagnostic Agents] Rash    a diffuse macular rash after CTA chest  Pt was premedicated with 125mg  IV Solumedrol, 50mg  IV Benadryl 1 hr prior to CTexam, and tolerated procedure without any difficulties. 11/28/11 Also same pre med protocol observed on 02/22/15 and pt tolerated the procedure well    FAMILY HISTORY:  indicated that his mother is alive. He indicated that his father is  deceased. He indicated that both of his sisters are alive. He indicated that his maternal grandmother is deceased. He indicated that his maternal grandfather is deceased. He indicated that his paternal grandmother is deceased. He indicated that his paternal grandfather is deceased. He indicated that both of his sons are alive.  SOCIAL HISTORY:  reports that he quit smoking about  9 months ago. His smoking use included Cigarettes. He has a 20 pack-year smoking history. He has never used smokeless tobacco. He reports that he does not drink alcohol or use illicit drugs.  REVIEW OF SYSTEMS:   + for n/v/d, fevers, chills, abdominal pain, abd distension, weakness, malaies - for chest pain, palpitations, SOB, DOE, heat or cold intolerance  SUBJECTIVE:   VITAL SIGNS: Temp:  [98 F (36.7 C)-98.7 F (37.1 C)] 98 F (36.7 C) (03/26 0034) Pulse Rate:  [83-115] 86 (03/26 0400) Resp:  [15-26] 20 (03/26 0400) BP: (42-146)/(26-127) 89/48 mmHg (03/26 0400) SpO2:  [92 %-100 %] 97 % (03/26 0400) Weight:  [106.595 kg (235 lb)] 106.595 kg (235 lb) (03/26 0152) HEMODYNAMICS:   VENTILATOR SETTINGS:   INTAKE / OUTPUT: No intake or output data in the 24 hours ending 04/12/15 0509  PHYSICAL EXAMINATION: General:  Mild acute distress Neuro:  AAOx3, CN II-XII intact HEENT:  NCAT Cardiovascular:  Irregular rhythm, no m/r/g Lungs:  CTA b/l no w/r/r Abdomen:  Soft, TTP LLQ, no rebound or guarding, hyperactive bowel sounds. Right colostomy pink and draining, LLQ drains with purulent fluid.  Midline abdominal wound - pink, granulated without erythema or pus Musculoskeletal:  Normal bulk and tone Skin:  No c/c/e  LABS:  CBC  Recent Labs Lab 04/11/15 2358 04/12/15 0237  WBC 21.9*  --   HGB 11.5* 11.9*  HCT 36.2* 35.0*  PLT 619*  --    Coag's  Recent Labs Lab 04/08/15  INR 4.0   BMET  Recent Labs Lab 04/11/15 2358 04/12/15 0237  NA 128* 126*  K 6.6* 6.4*  CL 92* 94*  CO2 22  --   BUN 51* 52*  CREATININE 4.09* 4.20*  GLUCOSE 128* 225*   Electrolytes  Recent Labs Lab 04/11/15 2358  CALCIUM 10.3   Sepsis Markers  Recent Labs Lab 04/12/15 0033 04/12/15 0238 04/12/15 0414  LATICACIDVEN 1.82 1.79 1.54   ABG No results for input(s): PHART, PCO2ART, PO2ART in the last 168 hours. Liver Enzymes  Recent Labs Lab 04/11/15 2358  AST 22  ALT 44   ALKPHOS 86  BILITOT 0.4  ALBUMIN 2.9*   Cardiac Enzymes No results for input(s): TROPONINI, PROBNP in the last 168 hours. Glucose No results for input(s): GLUCAP in the last 168 hours.  Imaging Ct Abdomen Pelvis Wo Contrast  04/12/2015  CLINICAL DATA:  Constant ongoing abdominal pain and left flank pain, worsening over the last week. Fever, shortness of breath, and generalized weakness. EXAM: CT ABDOMEN AND PELVIS WITHOUT CONTRAST TECHNIQUE: Multidetector CT imaging of the abdomen and pelvis was performed following the standard protocol without IV contrast. COMPARISON:  03/26/2015 FINDINGS: Small left pleural effusion with consolidation in the left base possibly pneumonia. Sub cm low-attenuation lesion in the posterior right lobe of the liver similar to prior study. Gallbladder, pancreas, spleen, adrenal glands, kidneys, abdominal aorta, inferior vena cava, and retroperitoneal lymph nodes are unremarkable. There is a loculated fluid collection under the left hemidiaphragm and posterior to the spleen measuring 4.4 cm maximal diameter. This likely represents a residual or recurrent abscess. Similar collection was seen in this location  previously, slightly smaller previously. This fluid collection extends inferiorly along the posterior psoas muscle consistent with psoas abscess. A left lower quadrant percutaneous drainage catheter is placed into the left anterior pericolic gutter. There is stranding in this area without discrete residual collection. 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 An additional pigtail drainage catheter is present in the left lower quadrant without residual collection. No free air or free fluid in the abdomen. New right abdominal transverse colostomy. Stomach, small bowel, and colon are not abnormally distended. Broad-based anterior abdominal wall hernia containing fat. Pelvis: Prostate gland is  enlarged. Bladder is decompressed and cannot be evaluated. No evidence of diverticulitis. No free or loculated pelvic fluid collections. No destructive bone lesions. IMPRESSION: There is a left subdiaphragmatic abscess measuring 4.4 cm, slightly larger than on previous study. Abscess continues down along the posterior paraspinous muscles to the left psoas muscle consistent with psoas abscess. Appearance is similar to previous study. 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 Right abdominal transverse colostomy. Electronically Signed   By: Lucienne Capers M.D.   On: 04/12/2015 03:04   Dg Chest Port 1 View  04/12/2015  CLINICAL DATA:  Fever. EXAM: PORTABLE CHEST 1 VIEW COMPARISON:  01/29/2015 FINDINGS: Cardiac enlargement without vascular congestion or edema. Slight atelectasis in the left lung base. Improvement of the lung bases since previous study. Can't exclude small left pleural effusion. No pneumothorax. Mediastinal contours appear intact. IMPRESSION: Cardiac enlargement. Atelectasis and possible small effusion on the left base. Improvement since previous study. Electronically Signed   By: Lucienne Capers M.D.   On: 04/12/2015 00:40     ASSESSMENT / PLAN: 50 yo male with persistent hypotension and AKI likely hypovolemic, pre-renal azotemia and septic shock.  PULMONARY A: Not active  CARDIOVASCULAR A:  Atrial fibrilation - rate controlled Septic Shock P:  - holding home metoprolol given shock - Lactate cleared - s/p 30cc/kg - maintenance fluid to keep up with Ostomy output with bicarb gtt  - On warfarin - current INR supratherapeutic  - hold warfarin tonight  RENAL A:   AKI likley hypovolemic pre-renal azotemia Hyperkalemia - without EKG changes Hypovolemic hyponatremia P:   - urine lytes - prot/cr ratio - nephrology consulted - no indication for emergent HD now - foley placement - fluid resuscitation  - trend  BMP Q6  GASTROINTESTINAL A:   Chronic intra-abdominal abscess Increased ostomy output  P:   - surgery consulted - Vancomycin and zosyn abx - GI pathogen panel - Cdiff - lactate normal - IR consult in AM for drain palcement  HEMATOLOGIC A:   Leukocytosis P:  - treat sepsis  INFECTIOUS A:  Septic shock with AKI - source intraabdominal - Cdiff possible vs UTI P:   BCx2 04/12/2015 UC 04/12/2015 Sputum N/A Abx: Vanc/zosyn , start date3/25/2017  ENDOCRINE A:   Hyperglycemia P:   SSI BG goal <180  NEUROLOGIC A:    Not active  PSYCH: A: Major depressive episode - surrounding circumstances of chronic abdominal issues  P: - suicidal without plan - Psych consult in AM - SSRI - per psych  FC/FT - patient is suicidal and says "dont do CPR just let me die" SCDs NPO  Total critical care time: 45 min  Critical care time was exclusive of separately billable procedures and treating other patients.  Critical care was necessary to treat or prevent imminent  or life-threatening deterioration.  Critical care was time spent personally by me on the following activities: development of treatment plan with patient and/or surrogate as well as nursing, discussions with consultants, evaluation of patient's response to treatment, examination of patient, obtaining history from patient or surrogate, ordering and performing treatments and interventions, ordering and review of laboratory studies, ordering and review of radiographic studies, pulse oximetry and re-evaluation of patient's condition.   Meribeth Mattes, DO., MS [facility]      Pulmonary and [place] Pager: [phone]  04/12/2015, 5:09 AM

## 2015-04-12 NOTE — ED Notes (Signed)
Dr Donne Hazel at the bedside.

## 2015-04-12 NOTE — Progress Notes (Signed)
[facility]  Contacted by nurses notify me that there were no admission orders  Chart reviewed, appears to have an intra-abdominal infection after recent colectomy which was complicated by multiple abscesses. There have been multiple drains in place that he return today with acute kidney injury and severe sepsis. He is now in our ICU.  Nephrology to see  I have written admission orders in keeping with the fellow's history and physical which was written earlier today including antibiotics, fluids, and glucose control measures. It appears that I are has been consulted for further drainage of his intra-abdominal abscesses.  We'll monitor  Roselie Awkward, MD [facility] Pager: [phone] Cell: [phone] After 3pm or if no response, call [phone]

## 2015-04-12 NOTE — Progress Notes (Signed)
[facility] Progress Note Patient Name: William Fischer DOB: September 08, 1965 MRN: [id number]   Date of Service  04/12/2015  HPI/Events of Note  [place]  eICU Interventions  Ambien     Intervention Category Major Interventions: Other:  Nima Kemppainen 04/12/2015, 11:19 PM

## 2015-04-12 NOTE — Progress Notes (Signed)
[facility] Progress Note Patient Name: William Fischer DOB: [birth year] MRN: [id number]   Date of Service  04/12/2015  HPI/Events of Note  Patient NPO  eICU Interventions  Change ambien to ativan     Intervention Category Major Interventions: Other:  YACOUB,WESAM 04/12/2015, 11:33 PM

## 2015-04-12 NOTE — ED Notes (Signed)
In triage, patient became dizzy, pale and diphoretic and was unresponsive for approximately 20 seconds, having seizure like activity in wheelchair. Patient came around, was able to pull self back into chair.  Patient very diaphoretic upon arrival to Trauma Room A.

## 2015-04-13 ENCOUNTER — Inpatient Hospital Stay (HOSPITAL_COMMUNITY): Payer: Medicaid Other

## 2015-04-13 ENCOUNTER — Inpatient Hospital Stay
Admission: RE | Admit: 2015-04-13 | Discharge: 2015-04-13 | Disposition: A | Discharge status: Home / Self Care | Payer: Medicaid Other | Source: Ambulatory Visit | Attending: General Surgery | Admitting: General Surgery

## 2015-04-13 DIAGNOSIS — B9689 Other specified bacterial agents as the cause of diseases classified elsewhere: Secondary | ICD-10-CM

## 2015-04-13 DIAGNOSIS — Z8619 Personal history of other infectious and parasitic diseases: Secondary | ICD-10-CM

## 2015-04-13 DIAGNOSIS — K651 Peritoneal abscess: Secondary | ICD-10-CM

## 2015-04-13 DIAGNOSIS — B999 Unspecified infectious disease: Secondary | ICD-10-CM

## 2015-04-13 DIAGNOSIS — R109 Unspecified abdominal pain: Secondary | ICD-10-CM

## 2015-04-13 DIAGNOSIS — R1032 Left lower quadrant pain: Secondary | ICD-10-CM

## 2015-04-13 DIAGNOSIS — R509 Fever, unspecified: Secondary | ICD-10-CM

## 2015-04-13 DIAGNOSIS — R1030 Lower abdominal pain, unspecified: Secondary | ICD-10-CM

## 2015-04-13 LAB — BASIC METABOLIC PANEL
ANION GAP: 10 (ref 5–15)
Anion gap: 9 (ref 5–15)
BUN: 26 mg/dL — ABNORMAL HIGH (ref 6–20)
BUN: 27 mg/dL — ABNORMAL HIGH (ref 6–20)
CALCIUM: 9.2 mg/dL (ref 8.9–10.3)
CALCIUM: 9.2 mg/dL (ref 8.9–10.3)
CO2: 28 mmol/L (ref 22–32)
CO2: 26 mmol/L (ref 22–32)
CREATININE: 2.24 mg/dL — AB (ref 0.61–1.24)
Chloride: 104 mmol/L (ref 101–111)
Chloride: 106 mmol/L (ref 101–111)
Creatinine, Ser: 2.1 mg/dL — ABNORMAL HIGH (ref 0.61–1.24)
GFR calc non Af Amer: 33 mL/min — ABNORMAL LOW (ref >60)
GFR, EST AFRICAN AMERICAN: 38 mL/min — AB (ref >60)
GFR, EST AFRICAN AMERICAN: 42 mL/min — AB (ref >60)
GFR, EST NON AFRICAN AMERICAN: 36 mL/min — AB (ref >60)
GLUCOSE: 111 mg/dL — AB (ref 65–99)
Glucose, Bld: 128 mg/dL — ABNORMAL HIGH (ref 65–99)
Potassium: 4.4 mmol/L (ref 3.5–5.1)
Potassium: 4.6 mmol/L (ref 3.5–5.1)
SODIUM: 141 mmol/L (ref 135–145)
SODIUM: 142 mmol/L (ref 135–145)

## 2015-04-13 LAB — GLUCOSE, CAPILLARY
GLUCOSE-CAPILLARY: 108 mg/dL — AB (ref 65–99)
GLUCOSE-CAPILLARY: 125 mg/dL — AB (ref 65–99)
GLUCOSE-CAPILLARY: 142 mg/dL — AB (ref 65–99)
Glucose-Capillary: 104 mg/dL — ABNORMAL HIGH (ref 65–99)
Glucose-Capillary: 107 mg/dL — ABNORMAL HIGH (ref 65–99)
Glucose-Capillary: 118 mg/dL — ABNORMAL HIGH (ref 65–99)

## 2015-04-13 LAB — CBC
HCT: 30.3 % — ABNORMAL LOW (ref 39.0–52.0)
Hemoglobin: 9.3 g/dL — ABNORMAL LOW (ref 13.0–17.0)
MCH: 28.7 pg (ref 26.0–34.0)
MCHC: 30.7 g/dL (ref 30.0–36.0)
MCV: 93.5 fL (ref 78.0–100.0)
PLATELETS: 561 10*3/uL — AB (ref 150–400)
RBC: 3.24 MIL/uL — ABNORMAL LOW (ref 4.22–5.81)
RDW: 16.9 % — AB (ref 11.5–15.5)
WBC: 11.4 10*3/uL — AB (ref 4.0–10.5)

## 2015-04-13 LAB — UREA NITROGEN, URINE: UREA NITROGEN UR: 298 mg/dL

## 2015-04-13 LAB — DIGOXIN LEVEL

## 2015-04-13 LAB — PROTIME-INR
INR: 4.84 — AB (ref 0.00–1.49)
INR: 2.62 — ABNORMAL HIGH (ref 0.00–1.49)
Prothrombin Time: 43.9 seconds — ABNORMAL HIGH (ref 11.6–15.2)
Prothrombin Time: 27.7 seconds — ABNORMAL HIGH (ref 11.6–15.2)

## 2015-04-13 LAB — URINE CULTURE

## 2015-04-13 LAB — APTT: APTT: 49 s — AB (ref 24–37)

## 2015-04-13 MED ORDER — AMIODARONE LOAD VIA INFUSION
150.0000 mg | Freq: Once | INTRAVENOUS | Status: AC
  Administered 2015-04-13: 150 mg via INTRAVENOUS
  Filled 2015-04-13: qty 83.34

## 2015-04-13 MED ORDER — METHYLPREDNISOLONE SODIUM SUCC 125 MG IJ SOLR
125.0000 mg | Freq: Once | INTRAMUSCULAR | Status: AC
  Administered 2015-04-13: 125 mg via INTRAVENOUS
  Filled 2015-04-13: qty 2

## 2015-04-13 MED ORDER — VITAMIN K1 10 MG/ML IJ SOLN
5.0000 mg | Freq: Once | INTRAVENOUS | Status: AC
  Administered 2015-04-13: 5 mg via INTRAVENOUS
  Filled 2015-04-13: qty 0.5

## 2015-04-13 MED ORDER — HYDROCORTISONE NA SUCCINATE PF 100 MG IJ SOLR
50.0000 mg | Freq: Four times a day (QID) | INTRAMUSCULAR | Status: DC
  Administered 2015-04-13 – 2015-04-15 (×9): 50 mg via INTRAVENOUS
  Filled 2015-04-13 (×2): qty 2
  Filled 2015-04-13 (×6): qty 1
  Filled 2015-04-13: qty 2
  Filled 2015-04-13 (×2): qty 1

## 2015-04-13 MED ORDER — DIGOXIN 0.25 MG/ML IJ SOLN
0.5000 mg | Freq: Once | INTRAMUSCULAR | Status: AC
  Administered 2015-04-13: 0.5 mg via INTRAVENOUS
  Filled 2015-04-13: qty 2

## 2015-04-13 MED ORDER — SODIUM CHLORIDE 0.9 % IV SOLN
Freq: Once | INTRAVENOUS | Status: AC
  Administered 2015-04-13: 16:00:00 via INTRAVENOUS

## 2015-04-13 MED ORDER — FLUDROCORTISONE ACETATE 0.1 MG PO TABS
0.1000 mg | ORAL_TABLET | Freq: Every day | ORAL | Status: DC
  Filled 2015-04-13 (×3): qty 1

## 2015-04-13 MED ORDER — VANCOMYCIN HCL 10 G IV SOLR
1250.0000 mg | INTRAVENOUS | Status: DC
  Administered 2015-04-13 – 2015-04-14 (×2): 1250 mg via INTRAVENOUS
  Filled 2015-04-13 (×3): qty 1250

## 2015-04-13 MED ORDER — AMIODARONE HCL IN DEXTROSE 360-4.14 MG/200ML-% IV SOLN
60.0000 mg/h | INTRAVENOUS | Status: DC
  Administered 2015-04-13 (×2): 60 mg/h via INTRAVENOUS
  Filled 2015-04-13 (×2): qty 200

## 2015-04-13 MED ORDER — SODIUM CHLORIDE 0.9 % IV SOLN
INTRAVENOUS | Status: DC
  Administered 2015-04-13: 1000 mL via INTRAVENOUS

## 2015-04-13 MED ORDER — BACITRACIN-NEOMYCIN-POLYMYXIN OINTMENT TUBE
TOPICAL_OINTMENT | Freq: Every day | CUTANEOUS | Status: DC
  Administered 2015-04-13: 10:00:00 via TOPICAL
  Administered 2015-04-14: 1 via TOPICAL
  Administered 2015-04-15 – 2015-04-19 (×5): via TOPICAL
  Filled 2015-04-13: qty 15

## 2015-04-13 MED ORDER — SODIUM CHLORIDE 0.9 % IV BOLUS (SEPSIS)
500.0000 mL | Freq: Once | INTRAVENOUS | Status: AC
  Administered 2015-04-13: 500 mL via INTRAVENOUS

## 2015-04-13 MED ORDER — AMIODARONE HCL IN DEXTROSE 360-4.14 MG/200ML-% IV SOLN
30.0000 mg/h | INTRAVENOUS | Status: DC
  Administered 2015-04-13 – 2015-04-15 (×5): 30 mg/h via INTRAVENOUS
  Filled 2015-04-13 (×8): qty 200

## 2015-04-13 MED ORDER — DIPHENHYDRAMINE HCL 50 MG/ML IJ SOLN
25.0000 mg | Freq: Once | INTRAMUSCULAR | Status: AC
  Administered 2015-04-13: 25 mg via INTRAVENOUS
  Filled 2015-04-13: qty 1

## 2015-04-13 MED ORDER — DIPHENHYDRAMINE-ZINC ACETATE 2-0.1 % EX CREA
TOPICAL_CREAM | Freq: Three times a day (TID) | CUTANEOUS | Status: DC | PRN
  Administered 2015-04-13: 23:00:00 via TOPICAL
  Filled 2015-04-13: qty 28

## 2015-04-13 NOTE — Progress Notes (Signed)
Pt refusing blood draws for ABG

## 2015-04-13 NOTE — Clinical Documentation Improvement (Signed)
Critical Care  Can the diagnosis of Atrial Fibrillation be further specified as to type ?  Thank you    Chronic Atrial fibrillation  Paroxysmal Atrial fibrillation  Permanent Atrial fibrillation  Persistent Atrial fibrillation  Other  Clinically Undetermined     Supporting Information: EKG showing atrial fibrillation/ tachycardia    Please exercise your independent, professional judgment when responding. A specific answer is not anticipated or expected.   Thank You,  [place] [phone]

## 2015-04-13 NOTE — Progress Notes (Signed)
Subjective: Interval History: has no complaint, feeling better..  Objective: Vital signs in last 24 hours: Temp:  [97.7 F (36.5 C)-99 F (37.2 C)] 98.9 F (37.2 C) (03/27 0342) Pulse Rate:  [75-153] 152 (03/27 0513) Resp:  [0-29] 26 (03/27 0513) BP: (91-123)/(50-99) 91/63 mmHg (03/27 0500) SpO2:  [95 %-100 %] 96 % (03/27 0513) Weight:  [109.6 kg (241 lb 10 oz)] 109.6 kg (241 lb 10 oz) (03/27 0513) Weight change: 3.005 kg (6 lb 10 oz)  Intake/Output from previous day: 03/26 0701 - 03/27 0700 In: 4455.3 [I.V.:4355.3; IV Piggyback:100] Out: [id number] [Urine:5270; Drains:190; Stool:1200] Intake/Output this shift: Total I/O In: 1606.4 [I.V.:1506.4; IV Piggyback:100] Out: 2065 [Urine:1800; Drains:15; Stool:250]  General appearance: cooperative, moderately obese and anxious Neck: R IJ cath Resp: clear to auscultation bilaterally Cardio: irregularly irregular rhythm and rate 1110-150 GI: pos bs, soft, obese Extremities: edema 1-2+  Lab Results:  Recent Labs  04/12/15 2331 04/13/15 0509  WBC 12.6* 11.4*  HGB 10.0* 9.3*  HCT 30.9* 30.3*  PLT 560* 561*   BMET:  Recent Labs  04/12/15 2331 04/13/15 0509  NA 141 142  K 4.6 4.4  CL 106 104  CO2 26 28  GLUCOSE 128* 111*  BUN 27* 26*  CREATININE 2.24* 2.10*  CALCIUM 9.2 9.2   No results for input(s): PTH in the last 72 hours. Iron Studies: No results for input(s): IRON, TIBC, TRANSFERRIN, FERRITIN in the last 72 hours.  Studies/Results: Ct Abdomen Pelvis Wo Contrast  04/12/2015  CLINICAL DATA:  Constant ongoing abdominal pain and left flank pain, worsening over the last week. Fever, shortness of breath, and generalized weakness. EXAM: CT ABDOMEN AND PELVIS WITHOUT CONTRAST TECHNIQUE: Multidetector CT imaging of the abdomen and pelvis was performed following the standard protocol without IV contrast. COMPARISON:  03/26/2015 FINDINGS: Small left pleural effusion with consolidation in the left base possibly pneumonia. Sub cm  low-attenuation lesion in the posterior right lobe of the liver similar to prior study. Gallbladder, pancreas, spleen, adrenal glands, kidneys, abdominal aorta, inferior vena cava, and retroperitoneal lymph nodes are unremarkable. There is a loculated fluid collection under the left hemidiaphragm and posterior to the spleen measuring 4.4 cm maximal diameter. This likely represents a residual or recurrent abscess. Similar collection was seen in this location previously, slightly smaller previously. This fluid collection extends inferiorly along the posterior psoas muscle consistent with psoas abscess. A left lower quadrant percutaneous drainage catheter is placed into the left anterior pericolic gutter. There is stranding in this area without discrete residual collection. 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 An additional pigtail drainage catheter is present in the left lower quadrant without residual collection. No free air or free fluid in the abdomen. New right abdominal transverse colostomy. Stomach, small bowel, and colon are not abnormally distended. Broad-based anterior abdominal wall hernia containing fat. Pelvis: Prostate gland is enlarged. Bladder is decompressed and cannot be evaluated. No evidence of diverticulitis. No free or loculated pelvic fluid collections. No destructive bone lesions. IMPRESSION: There is a left subdiaphragmatic abscess measuring 4.4 cm, slightly larger than on previous study. Abscess continues down along the posterior paraspinous muscles to the left psoas muscle consistent with psoas abscess. Appearance is similar to previous study. 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 Right abdominal transverse colostomy. Electronically Signed   By: Lucienne Capers M.D.   On: 04/12/2015 03:04  Dg Chest Portable 1 View  04/12/2015  CLINICAL  DATA:  Central line placement EXAM: PORTABLE CHEST 1 VIEW COMPARISON:  04/12/2015. FINDINGS: RIGHT IJ catheter tip cavoatrial junction. No pneumothorax. Cardiomegaly. Developing edema. IMPRESSION: RIGHT IJ catheter tip cavoatrial junction. No pneumothorax. Worsening aeration. Electronically Signed   By: Staci Righter M.D.   On: 04/12/2015 07:03   Dg Chest Port 1 View  04/12/2015  CLINICAL DATA:  Fever. EXAM: PORTABLE CHEST 1 VIEW COMPARISON:  01/29/2015 FINDINGS: Cardiac enlargement without vascular congestion or edema. Slight atelectasis in the left lung base. Improvement of the lung bases since previous study. Can't exclude small left pleural effusion. No pneumothorax. Mediastinal contours appear intact. IMPRESSION: Cardiac enlargement. Atelectasis and possible small effusion on the left base. Improvement since previous study. Electronically Signed   By: Lucienne Capers M.D.   On: 04/12/2015 00:40    I have reviewed the patient's current medications.  Assessment/Plan: 1 AKI  Improving, nonoliguric, acid base better, stop bicarb,.  K ok.  Ischemic ATN etiology 2 Sepsis on Vanc/Zosyn/anid  More stable. On NE 3 Obesity 4 Afib rate variable 5 divertic with abscess 6 anxiety 7 Resp failure improving P stop bicarb, use NS, cont AB,  Will follow 1 more day if cont to improve.  Rate control.   LOS: 1 day   Fawzi Melman L 04/13/2015,6:57 AM

## 2015-04-13 NOTE — Care Management Note (Signed)
Case Management Note  Patient Details  Name: William Fischer MRN: [id number] Date of Birth: 02-02-1965  Subjective/Objective:     Pt admitted with abdominal pain                Action/Plan:  Pt recently [facility] with wound vac/Drains requiring HHRN.  Pt is active with [facility] and would like to resume services at discharge if needed - pt is scheduled today for IR to place additional drain.  CM contacted agency and informed of admit.  CM will continue to monitor for disposition needs   Expected Discharge Date:                  Expected Discharge Plan:  [place]  In-House Referral:     Discharge planning Services  CM Consult  Post Acute Care Choice:    Choice offered to:  Patient  DME Arranged:    DME Agency:     HH Arranged:  RN [place] Agency:  [place]  Status of Service:  In process, will continue to follow  Medicare Important Message Given:    Date Medicare IM Given:    Medicare IM give by:    Date Additional Medicare IM Given:    Additional Medicare Important Message give by:     If discussed at [place] of Stay Meetings, dates discussed:    Additional Comments:  Maryclare Labrador, RN 04/13/2015, 9:45 AM

## 2015-04-13 NOTE — H&P (Signed)
PULMONARY / CRITICAL CARE MEDICINE   Name: William Fischer MRN: [id number] DOB: [birth year]    ADMISSION DATE:  04/12/2015 CHIEF COMPLAINT:  Fevers abdominal pain  HISTORY OF PRESENT ILLNESS:   50 yo male with ischemic bowel disease who underwent Ex-lap with left, colectomy, with end colostomy which was complicated by intra-abdominal abscess and rectal stump leak in December of 2016.  Intra-abdominal drains were placed.  He has had multiple hospitalization from intra-abdominal complications.  He was on chronic ciprofloxacin for 3-4 months and this was stopped 4 weeks ago.  Over the last week he has noted increased LLQ abdominal pain with bloating, n/v and profuse ostomy output.  He was febrile at home to 102.  SUBJECTIVE:  Remains on pressors  VITAL SIGNS: Temp:  [97.8 F (36.6 C)-99 F (37.2 C)] 98.1 F (36.7 C) (03/27 1123) Pulse Rate:  [52-152] 98 (03/27 1100) Resp:  [16-29] 26 (03/27 1100) BP: (84-123)/(50-73) 114/68 mmHg (03/27 1100) SpO2:  [95 %-100 %] 95 % (03/27 1100) Weight:  [109.6 kg (241 lb 10 oz)] 109.6 kg (241 lb 10 oz) (03/27 0513) HEMODYNAMICS:   VENTILATOR SETTINGS:   INTAKE / OUTPUT:  Intake/Output Summary (Last 24 hours) at 04/13/15 1152 Last data filed at 04/13/15 1100  Gross per 24 hour  Intake 3714.52 ml  Output   5585 ml  Net -1870.48 ml    PHYSICAL EXAMINATION: General:  Mild acute distress Neuro:  AAOx3, CN II-XII intact HEENT:  jvd low Cardiovascular:  Irregular rhythm, no m/r/g Lungs:  CTA  Abdomen:  Soft, TTP LLQ, no rebound or guarding, hyperactive bowel sounds. Right colostomy pink and draining, LLQ drains with purulent fluid.  Midline abdominal wound - pink, granulated without erythema or pus Musculoskeletal:  Normal bulk and tone Skin:  No c/c/e  LABS:  CBC  Recent Labs Lab 04/12/15 1430 04/12/15 2331 04/13/15 0509  WBC 15.7* 12.6* 11.4*  HGB 10.2* 10.0* 9.3*  HCT 30.8* 30.9* 30.3*  PLT 550* 560* 561*   Coag's  Recent  Labs Lab 04/08/15 04/11/15 2358 04/13/15 1040  INR 4.0 2.76* 4.84*   BMET  Recent Labs Lab 04/12/15 1430 04/12/15 2331 04/13/15 0509  NA 138 141 142  K 4.8 4.6 4.4  CL 105 106 104  CO2 24 26 28   BUN 35* 27* 26*  CREATININE 2.78* 2.24* 2.10*  GLUCOSE 136* 128* 111*   Electrolytes  Recent Labs Lab 04/12/15 1430 04/12/15 2331 04/13/15 0509  CALCIUM 9.3 9.2 9.2  MG 2.0  --   --   PHOS 5.1*  --   --    Sepsis Markers  Recent Labs Lab 04/12/15 0238 04/12/15 0414 04/12/15 1430  LATICACIDVEN 1.79 1.54 0.8  PROCALCITON  --   --  0.55   ABG No results for input(s): PHART, PCO2ART, PO2ART in the last 168 hours. Liver Enzymes  Recent Labs Lab 04/11/15 2358 04/12/15 1430  AST 22 14*  ALT 44 31  ALKPHOS 86 68  BILITOT 0.4 0.3  ALBUMIN 2.9* 2.3*   Cardiac Enzymes No results for input(s): TROPONINI, PROBNP in the last 168 hours. Glucose  Recent Labs Lab 04/12/15 1352 04/12/15 1550 04/12/15 2011 04/13/15 0016 04/13/15 0341 04/13/15 0745  GLUCAP 129* 118* 110* 125* 108* 104*    Imaging No results found.   ASSESSMENT / PLAN: 50 yo male with persistent hypotension and AKI likely hypovolemic, pre-renal azotemia and septic shock.  PULMONARY A: at risk atx pcxr in am   CARDIOVASCULAR A:  Atrial fibrilation -  rate NOT controlled Septic Shock Rel AI noted P:  - Lactate cleared -tele -get cvp -levophed to map 60-65 -may need echo -amio bolus and load -bolus volume -dig x 1 now -add stress riods, and florinef   RENAL A:   AKI likley hypovolemic pre-renal azotemia Hyperkalemia resolved Hypovolemic hyponatremia improved P:   -per renal -allow pos balance  GASTROINTESTINAL A:   Chronic intra-abdominal abscess Increased ostomy output P:   - surgery consulted - GI pathogen panel - Cdiff - IR consult in AM for drain palcement  HEMATOLOGIC A:   Leukocytosis coagulapthy (sepsis) P:  - treat sepsis -vit K stat -ffp x 4 -Repeat  coags  INFECTIOUS A:  Septic shock with AKI - source intraabdominal - Cdiff possible vs UTI P:   BCx2 04/12/2015 UC 04/12/2015 Sputum N/A Abx: Vanc/zosyn , start date3/25/2017 eraxis Need drain placed stat, will correct coags STAT  ENDOCRINE A:   Hyperglycemia P:   SSI BG goal <180  NEUROLOGIC A:    Not active  PSYCH: A: Major depressive episode - surrounding circumstances of chronic abdominal issues  P: - suicidal without plan, call psych - SSRI - per psych  Ccm time 30 min  Lavon Paganini. Titus Mould, MD, [place] Pgr: [facility]

## 2015-04-13 NOTE — Progress Notes (Signed)
Pharmacy Antibiotic Note  William Fischer is a 50 year old male s/p exploratory laparotomy, left colectomy, end colostomy for ischemic bowel on [id number] complicated by post op intra-abdominal abscesses and rectal stump leak withprolonged hospitalization. Pt continues with perc drains. Pt reports that one drain continues to fill up with pus. Pharmacy has been consulted for Zosyn and Vancomycin dosing. Renal function has improved with SCr now 2.1. Pt is afebrile and WBC is trending down to 11.3.   Plan: - Change vancomycin to 1250mg  IV Q24H - Continue zosyn 3.375gm IV Q8H (4 hr inf)  - F/u renal fxn, C&S, clinical status and trough at SS  Height: 5\' 10"  (177.8 cm) Weight: 241 lb 10 oz (109.6 kg) IBW/kg (Calculated) : 73  Temp (24hrs), Avg:98.3 F (36.8 C), Min:97.7 F (36.5 C), Max:99 F (37.2 C)   Recent Labs Lab 04/11/15 2358 04/12/15 0033 04/12/15 0237 04/12/15 0238 04/12/15 0403 04/12/15 0414 04/12/15 1430 04/12/15 2331 04/13/15 0509  WBC 21.9*  --   --   --   --   --  15.7* 12.6* 11.4*  CREATININE 4.09*  --  4.20*  --  4.14*  --  2.78* 2.24* 2.10*  LATICACIDVEN  --  1.82  --  1.79  --  1.54 0.8  --   --     Estimated Creatinine Clearance: 53.9 mL/min (by C-G formula based on Cr of 2.1).    Allergies  Allergen Reactions  . Contrast Media [Iodinated Diagnostic Agents] Rash    a diffuse macular rash after CTA chest  Pt was premedicated with 125mg  IV Solumedrol, 50mg  IV Benadryl 1 hr prior to CTexam, and tolerated procedure without any difficulties. 11/28/11 Also same pre med protocol observed on 02/22/15 and pt tolerated the procedure well    Antimicrobials this admission: 3/26 Zosyn >>  3/26 Vanc >> 3/26 Eraxis >>  Microbiology results: 3/25 BCx:  3/26 UCx:  3/26 Cdiff - NEG 3/26 GI panel - NEG 3/26 MRSA - NEG  Thank you for allowing pharmacy to be a part of this patient's care.  Shawn Carattini, Rande Lawman, PharmD 04/13/2015, 7:20 AM

## 2015-04-13 NOTE — Progress Notes (Signed)
Initial Nutrition Assessment  DOCUMENTATION CODES:   Obesity unspecified  INTERVENTION:  -Upon diet advancement per MD, will supplement as needed  -If pt is to remain NPO consider nutrition support.   NUTRITION DIAGNOSIS:   Inadequate oral intake related to inability to eat as evidenced by NPO status.  GOAL:   Patient will meet greater than or equal to 90% of their needs  MONITOR:   Diet advancement, Labs, Weight trends  REASON FOR ASSESSMENT:   Malnutrition Screening Tool    ASSESSMENT:   50 yo male with ischemic bowel disease who underwent Ex-lap with left, colectomy, with end colostomy which was complicated by intra-abdominal abscess and rectal stump leak in December of 2016. Intra-abdominal drains were placed. He has had multiple hospitalization from intra-abdominal complications. He was on chronic ciprofloxacin for 3-4 months and this was stopped 4 weeks ago. 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 Pt states PTA he was eating well, 3 meals daily. He denies decreased appetite.   Pt reports he is actively trying to loose weight. He is not following any specific diet, he is trying to eat healthier by consuming such items as Ensure, yogurts, and bananas. Pt reports he has lost 86 lb since 12/2014. Per chart pt has lost 30 lb within 2 months.   Conducted nutrition focused physical exam, identified no muscle wasting, no fat wasting.  Discussed pt with RN.   Medications reviewed. Labs reviewed phosphorus 5.1 mg/dL   Diet Order:  Diet NPO time specified  Skin:  Reviewed, no issues  Last BM:  04/12/2015  Height:   Ht Readings from Last 1 Encounters:  04/13/15 5\' 10"  (1.778 m)    Weight:   Wt Readings from Last 1 Encounters:  04/13/15 241 lb 10 oz (109.6 kg)    Ideal Body Weight:  75.4 kg  BMI:  Body  mass index is 34.67 kg/(m^2).  Estimated Nutritional Needs:   Kcal:  1900-2100  Protein:  100-110 grams (1.3 g/IBW)  Fluid:  >/= 2 L   EDUCATION NEEDS:   No education needs identified at this time  [place], Dietetic Intern Pager: [phone]

## 2015-04-13 NOTE — Progress Notes (Signed)
[facility] Progress Note    Subjective: Patient is awake and alert.  Has not gotten any of his home medications.  Tachycardic into the 140's to 150's  Objective: Vital signs in last 24 hours: Temp:  [97.7 F (36.5 C)-99 F (37.2 C)] 98.1 F (36.7 C) (03/27 0745) Pulse Rate:  [52-152] 52 (03/27 0700) Resp:  [0-29] 21 (03/27 0700) BP: (84-123)/(50-73) 84/73 mmHg (03/27 0700) SpO2:  [95 %-100 %] 98 % (03/27 0700) Weight:  [109.6 kg (241 lb 10 oz)] 109.6 kg (241 lb 10 oz) (03/27 0513) Last BM Date: 04/12/15 (colostomy)  Intake/Output from previous day: 03/26 0701 - 03/27 0700 In: 4580.3 [I.V.:4480.3; IV Piggyback:100] Out: [id number] [Urine:5270; Drains:190; Stool:1200] Intake/Output this shift: Total I/O In: 7.5 [I.V.:7.5] Out: 350 [Urine:350]  General: No acute distress.  It is his understanding that his INR is being reversed and the percutaneous drain will be placed today.  Lungs: Clear to auscultation.  Abd: Soft, midline wound nearly completely epithelialized.  Non-tender.  Excellent bowel sounds.  Three drains in the LLQ with mucoid pus in all.  Total output from those drains 195cc/24 hours.  Extremities: No changes  Neuro: Intact  Lab Results:  @LABLAST2 (wbc:2,hgb:2,hct:2,plt:2) BMET ) Recent Labs  04/12/15 2331 04/13/15 0509  NA 141 142  K 4.6 4.4  CL 106 104  CO2 26 28  GLUCOSE 128* 111*  BUN 27* 26*  CREATININE 2.24* 2.10*  CALCIUM 9.2 9.2   PT/INR  Recent Labs  04/11/15 2358  LABPROT 28.8*  INR 2.76*   ABG No results for input(s): PHART, HCO3 in the last 72 hours.  Invalid input(s): PCO2, PO2  Studies/Results: Ct Abdomen Pelvis Wo Contrast  04/12/2015  CLINICAL DATA:  Constant ongoing abdominal pain and left flank pain, worsening over the last week. Fever, shortness of breath, and generalized weakness. EXAM: CT ABDOMEN AND PELVIS WITHOUT CONTRAST TECHNIQUE: Multidetector CT imaging of the abdomen and pelvis was performed following the standard  protocol without IV contrast. COMPARISON:  03/26/2015 FINDINGS: Small left pleural effusion with consolidation in the left base possibly pneumonia. Sub cm low-attenuation lesion in the posterior right lobe of the liver similar to prior study. Gallbladder, pancreas, spleen, adrenal glands, kidneys, abdominal aorta, inferior vena cava, and retroperitoneal lymph nodes are unremarkable. There is a loculated fluid collection under the left hemidiaphragm and posterior to the spleen measuring 4.4 cm maximal diameter. This likely represents a residual or recurrent abscess. Similar collection was seen in this location previously, slightly smaller previously. This fluid collection extends inferiorly along the posterior psoas muscle consistent with psoas abscess. A left lower quadrant percutaneous drainage catheter is placed into the left anterior pericolic gutter. There is stranding in this area without discrete residual collection. 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 An additional pigtail drainage catheter is present in the left lower quadrant without residual collection. No free air or free fluid in the abdomen. New right abdominal transverse colostomy. Stomach, small bowel, and colon are not abnormally distended. Broad-based anterior abdominal wall hernia containing fat. Pelvis: Prostate gland is enlarged. Bladder is decompressed and cannot be evaluated. No evidence of diverticulitis. No free or loculated pelvic fluid collections. No destructive bone lesions. IMPRESSION: There is a left subdiaphragmatic abscess measuring 4.4 cm, slightly larger than on previous study. Abscess continues down along the posterior paraspinous muscles to the left psoas muscle consistent with psoas abscess. Appearance is similar to previous study. 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 Right  abdominal transverse colostomy. Electronically Signed   By: Lucienne Capers M.D.   On: 04/12/2015 03:04   Dg Chest Portable 1 View  04/12/2015  CLINICAL DATA:  Central line placement EXAM: PORTABLE CHEST 1 VIEW COMPARISON:  04/12/2015. FINDINGS: RIGHT IJ catheter tip cavoatrial junction. No pneumothorax. Cardiomegaly. Developing edema. IMPRESSION: RIGHT IJ catheter tip cavoatrial junction. No pneumothorax. Worsening aeration. Electronically Signed   By: Staci Righter M.D.   On: 04/12/2015 07:03   Dg Chest Port 1 View  04/12/2015  CLINICAL DATA:  Fever. EXAM: PORTABLE CHEST 1 VIEW COMPARISON:  01/29/2015 FINDINGS: Cardiac enlargement without vascular congestion or edema. Slight atelectasis in the left lung base. Improvement of the lung bases since previous study. Can't exclude small left pleural effusion. No pneumothorax. Mediastinal contours appear intact. IMPRESSION: Cardiac enlargement. Atelectasis and possible small effusion on the left base. Improvement since previous study. Electronically Signed   By: Lucienne Capers M.D.   On: 04/12/2015 00:40    Anti-infectives: Anti-infectives    Start     Dose/Rate Route Frequency Ordered Stop   04/13/15 1800  vancomycin (VANCOCIN) 1,250 mg in sodium chloride 0.9 % 250 mL IVPB     1,250 mg 166.7 mL/hr over 90 Minutes Intravenous Every 24 hours 04/13/15 0722     04/13/15 1500  anidulafungin (ERAXIS) 100 mg in sodium chloride 0.9 % 100 mL IVPB     100 mg over 90 Minutes Intravenous Every 24 hours 04/12/15 1434     04/12/15 1700  vancomycin (VANCOCIN) 1,500 mg in sodium chloride 0.9 % 500 mL IVPB  Status:  Discontinued     1,500 mg 250 mL/hr over 120 Minutes Intravenous Every 48 hours 04/12/15 1528 04/13/15 0722   04/12/15 1500  anidulafungin (ERAXIS) 200 mg in sodium chloride 0.9 % 200 mL IVPB     200 mg over 180 Minutes Intravenous  Once 04/12/15 1434 04/12/15 1801   04/12/15 1430  anidulafungin (ERAXIS) 100 mg in sodium chloride 0.9 % 100 mL IVPB   Status:  Discontinued     100 mg over 90 Minutes Intravenous Every 24 hours 04/12/15 1418 04/12/15 1434   04/12/15 1000  piperacillin-tazobactam (ZOSYN) IVPB 3.375 g     3.375 g 12.5 mL/hr over 240 Minutes Intravenous 3 times per day 04/12/15 0545     04/12/15 0345  vancomycin (VANCOCIN) IVPB 1000 mg/200 mL premix     1,000 mg 200 mL/hr over 60 Minutes Intravenous  Once 04/12/15 0334 04/12/15 0843   04/12/15 0130  piperacillin-tazobactam (ZOSYN) IVPB 3.375 g     3.375 g 100 mL/hr over 30 Minutes Intravenous  Once 04/12/15 0115 04/12/15 0215      Assessment/Plan: s/p  Improved renal functiion, but not normal  Repeat INR is pending and that weill help determine if the patient will be able to go to the IR procedure today. Tachycardic needs better control.  Patient is currently not on his home medications.  Even if he is NPO he can take his medications with sips of water except for the coumadin. Will come back to see the patient later today.  LOS: 1 day   Kathryne Eriksson. Dahlia Bailiff, MD, FACS [phone] [facility] Surgery 04/13/2015

## 2015-04-13 NOTE — Consult Note (Signed)
[facility] for Infectious Disease       Reason for Consult: intraabdominal abscess    Referring Physician: Dr. Rayetta Pigg  Active Problems:   Abdominal pain   Septic shock ([place])   . anidulafungin  100 mg Intravenous Q24H  . antiseptic oral rinse  7 mL Mouth Rinse BID  . heparin  5,000 Units Subcutaneous 3 times per day  . insulin aspart  0-15 Units Subcutaneous 6 times per day  . neomycin-bacitracin-polymyxin   Topical Daily  . piperacillin-tazobactam (ZOSYN)  IV  3.375 g Intravenous 3 times per day  . vancomycin  1,250 mg Intravenous Q24H    Recommendations: Continue with broad antibioitcs for now Will narrow based on culture result Will likely use IV therapy at discharge but will wait for culture results Routine HIV testing  Assessment: He has an intraabdominal abscess that has been difficult to treat.  Had been on long term cipro but now with more fluid collection.  Culture in January grew E coli.    Antibiotics: Vancomycin and pip/tazo  Initially, he was on broad spectrum antibiotics and narrowed to Cefdinir at discharge from initial admission.  After February admission he left with Cipro, looks like about 3 weeks.  Antibiotics then stopped prior to this admission.   HPI: William Fischer is a 50 y.o. 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  He has had continued drains, currently 3, with significant drainage.  01/28/15 noted rectal stump blowout and pelvic abscess with drainage and this grew E coli and left with Cefdinir. He returned with pain and drainage and changed to cipro.[name]  He continued to have drainage and seemed to be improving but returned yesterday with pain and weakness.  CT with abscess that is slightly larger than previous.   CT abd independently reviewed and abscess noted.    Review of Systems:  Constitutional: negative for fevers, chills since starting antibiotics Integument/breast: negative for rash Musculoskeletal: negative for myalgias and arthralgias All other systems reviewed and are negative   Past Medical History  Diagnosis Date  . Chicken pox as a child  . Migraine 06/29/2011  . Obesity 06/29/2011  . Depression with anxiety 06/29/2011  . Atrial fibrillation ([place])     admx 11/13 with acute sCHF in setting of RVR  => a. failed DCCV x 2; b. Pradaxa started;  c. failed sotalol  . Chronic systolic heart failure ([place])     a. echo 11/13: Ef 40-45%, diff HK, mod MR, mod LAE, mild RVE, mod RAE, small effusion;   b. TEE 11/13:  EF 35-40%, no LAA clot; Echo 2/14 shows normal EF  . Cardiomyopathy ([place])     likely tachy mediated in setting of AF with RVR  . Snoring     patient needs sleep study - has declined  . Allergy to IVP dye   . Chronic anticoagulation     Pradaxa    Social History  Substance Use Topics  . Smoking status: Former Smoker -- 1.00 packs/day for 20 years    Types: Cigarettes    Quit date: 07/10/2014  . Smokeless tobacco: Never Used  . Alcohol Use: No    Family History  Problem Relation Age of Onset  . Hypertension Mother   . Hypertension Father   . Aneurysm Father   . Alcohol abuse Father   . Cancer Maternal Grandmother  brain  . Diabetes Maternal Grandfather     type 2  . Parkinsonism Maternal Grandfather   . Cancer Paternal Grandmother     breast  . Diabetes Paternal Grandmother   . Alcohol abuse Paternal Grandfather     Allergies  Allergen Reactions  . Contrast Media [Iodinated Diagnostic Agents] Rash    a diffuse macular rash after CTA chest  Pt was premedicated with 125mg  IV Solumedrol, 50mg  IV Benadryl 1 hr prior to CTexam, and tolerated procedure without any difficulties. 11/28/11 Also same pre med protocol observed on 02/22/15 and pt tolerated the procedure well    Physical Exam: Constitutional: in no  apparent distress and alert  Filed Vitals:   04/13/15 0745 04/13/15 0800  BP:  99/59  Pulse:  140  Temp: 98.1 F (36.7 C)   Resp:  24   EYES: anicteric ENMT: no thrush Cardiovascular: Cor irreg, irreg RRR Respiratory: CTA B, anterior exam; normal respiratory effort GI: Bowel sounds are normal Musculoskeletal: no pedal edema noted Skin: negatives: no rash Hematologic: no cervical lad  Lab Results  Component Value Date   WBC 11.4* 04/13/2015   HGB 9.3* 04/13/2015   HCT 30.3* 04/13/2015   MCV 93.5 04/13/2015   PLT 561* 04/13/2015    Lab Results  Component Value Date   CREATININE 2.10* 04/13/2015   BUN 26* 04/13/2015   NA 142 04/13/2015   K 4.4 04/13/2015   CL 104 04/13/2015   CO2 28 04/13/2015    Lab Results  Component Value Date   ALT 31 04/12/2015   AST 14* 04/12/2015   ALKPHOS 68 04/12/2015     Microbiology: Recent Results (from the past 240 hour(s))  C difficile quick scan w PCR reflex     Status: None   Collection Time: 04/12/15  7:17 AM  Result Value Ref Range Status   C Diff antigen NEGATIVE NEGATIVE Final   C Diff toxin NEGATIVE NEGATIVE Final   C Diff interpretation Negative for toxigenic C. difficile  Final  Gastrointestinal Panel by PCR , Stool     Status: None   Collection Time: 04/12/15  7:17 AM  Result Value Ref Range Status   Campylobacter species NOT DETECTED NOT DETECTED Final   Plesimonas shigelloides NOT DETECTED NOT DETECTED Final   Salmonella species NOT DETECTED NOT DETECTED Final   Yersinia enterocolitica NOT DETECTED NOT DETECTED Final   Vibrio species NOT DETECTED NOT DETECTED Final   Vibrio cholerae NOT DETECTED NOT DETECTED Final   Enteroaggregative E coli (EAEC) NOT DETECTED NOT DETECTED Final   Enteropathogenic E coli (EPEC) NOT DETECTED NOT DETECTED Final   Enterotoxigenic E coli (ETEC) NOT DETECTED NOT DETECTED Final   Shiga like toxin producing E coli (STEC) NOT DETECTED NOT DETECTED Final   E. coli O157 NOT DETECTED NOT  DETECTED Final   Shigella/Enteroinvasive E coli (EIEC) NOT DETECTED NOT DETECTED Final   Cryptosporidium NOT DETECTED NOT DETECTED Final   Cyclospora cayetanensis NOT DETECTED NOT DETECTED Final   Entamoeba histolytica NOT DETECTED NOT DETECTED Final   Giardia lamblia NOT DETECTED NOT DETECTED Final   Adenovirus F40/41 NOT DETECTED NOT DETECTED Final   Astrovirus NOT DETECTED NOT DETECTED Final   Norovirus GI/GII NOT DETECTED NOT DETECTED Final   Rotavirus A NOT DETECTED NOT DETECTED Final   Sapovirus (I, II, IV, and V) NOT DETECTED NOT DETECTED Final  MRSA PCR Screening     Status: None   Collection Time: 04/12/15  2:33 PM  Result Value Ref  Range Status   MRSA by PCR NEGATIVE NEGATIVE Final    Comment:        The GeneXpert MRSA Assay (FDA approved for NASAL specimens only), is one component of a comprehensive MRSA colonization surveillance program. It is not intended to diagnose MRSA infection nor to guide or monitor treatment for MRSA infections.     Scharlene Gloss, [place] for Infectious Disease [place] [email or web address] [phone] pager  [phone] cell 04/13/2015, 10:02 AM

## 2015-04-13 NOTE — Progress Notes (Signed)
[facility] Progress Note Patient Name: William Fischer DOB: November 28, 1965 MRN: [id number]   Date of Service  04/13/2015  HPI/Events of Note  Patient c/o isolated and small lesions on forehead and chest. Nurse states that the look like insect bites.   eICU Interventions  Will order Benadryl Cream to forehead and chest TID PRN.      Intervention Category Minor Interventions: Routine modifications to care plan (e.g. PRN medications for pain, fever)  [name],[name] 04/13/2015, 11:02 PM

## 2015-04-14 ENCOUNTER — Inpatient Hospital Stay (HOSPITAL_COMMUNITY): Payer: Medicaid Other

## 2015-04-14 DIAGNOSIS — IMO0002 Reserved for concepts with insufficient information to code with codable children: Secondary | ICD-10-CM | POA: Diagnosis present

## 2015-04-14 DIAGNOSIS — E875 Hyperkalemia: Secondary | ICD-10-CM

## 2015-04-14 DIAGNOSIS — F0631 Mood disorder due to known physiological condition with depressive features: Secondary | ICD-10-CM | POA: Insufficient documentation

## 2015-04-14 LAB — GLUCOSE, CAPILLARY
GLUCOSE-CAPILLARY: 117 mg/dL — AB (ref 65–99)
GLUCOSE-CAPILLARY: 131 mg/dL — AB (ref 65–99)
GLUCOSE-CAPILLARY: 141 mg/dL — AB (ref 65–99)
GLUCOSE-CAPILLARY: 180 mg/dL — AB (ref 65–99)
Glucose-Capillary: 106 mg/dL — ABNORMAL HIGH (ref 65–99)
Glucose-Capillary: 90 mg/dL (ref 65–99)

## 2015-04-14 LAB — COMPREHENSIVE METABOLIC PANEL
ALBUMIN: 2.5 g/dL — AB (ref 3.5–5.0)
ALT: 23 U/L (ref 17–63)
ANION GAP: 11 (ref 5–15)
AST: 13 U/L — ABNORMAL LOW (ref 15–41)
Alkaline Phosphatase: 59 U/L (ref 38–126)
BUN: 25 mg/dL — ABNORMAL HIGH (ref 6–20)
CHLORIDE: 106 mmol/L (ref 101–111)
CO2: 25 mmol/L (ref 22–32)
Calcium: 9.6 mg/dL (ref 8.9–10.3)
Creatinine, Ser: 1.96 mg/dL — ABNORMAL HIGH (ref 0.61–1.24)
GFR calc non Af Amer: 39 mL/min — ABNORMAL LOW (ref >60)
GFR, EST AFRICAN AMERICAN: 45 mL/min — AB (ref >60)
GLUCOSE: 137 mg/dL — AB (ref 65–99)
POTASSIUM: 4.2 mmol/L (ref 3.5–5.1)
SODIUM: 142 mmol/L (ref 135–145)
Total Bilirubin: 0.6 mg/dL (ref 0.3–1.2)
Total Protein: 7.1 g/dL (ref 6.5–8.1)

## 2015-04-14 LAB — CBC WITH DIFFERENTIAL/PLATELET
BASOS PCT: 0 %
Basophils Absolute: 0 10*3/uL (ref 0.0–0.1)
Eosinophils Absolute: 0 10*3/uL (ref 0.0–0.7)
Eosinophils Relative: 0 %
HEMATOCRIT: 29.8 % — AB (ref 39.0–52.0)
HEMOGLOBIN: 8.9 g/dL — AB (ref 13.0–17.0)
LYMPHS ABS: 1 10*3/uL (ref 0.7–4.0)
LYMPHS PCT: 10 %
MCH: 28.5 pg (ref 26.0–34.0)
MCHC: 29.9 g/dL — AB (ref 30.0–36.0)
MCV: 95.5 fL (ref 78.0–100.0)
MONO ABS: 0.5 10*3/uL (ref 0.1–1.0)
MONOS PCT: 5 %
NEUTROS ABS: 8.9 10*3/uL — AB (ref 1.7–7.7)
NEUTROS PCT: 85 %
Platelets: 546 10*3/uL — ABNORMAL HIGH (ref 150–400)
RBC: 3.12 MIL/uL — ABNORMAL LOW (ref 4.22–5.81)
RDW: 16.8 % — ABNORMAL HIGH (ref 11.5–15.5)
WBC: 10.4 10*3/uL (ref 4.0–10.5)

## 2015-04-14 LAB — PREPARE FRESH FROZEN PLASMA
UNIT DIVISION: 0
UNIT DIVISION: 0
UNIT DIVISION: 0
Unit division: 0

## 2015-04-14 LAB — PROTIME-INR
INR: 1.57 — AB (ref 0.00–1.49)
Prothrombin Time: 18.8 seconds — ABNORMAL HIGH (ref 11.6–15.2)

## 2015-04-14 LAB — PHOSPHORUS: Phosphorus: 3.5 mg/dL (ref 2.5–4.6)

## 2015-04-14 MED ORDER — FENTANYL CITRATE (PF) 100 MCG/2ML IJ SOLN
INTRAMUSCULAR | Status: AC
  Filled 2015-04-14: qty 2

## 2015-04-14 MED ORDER — MIDAZOLAM HCL 2 MG/2ML IJ SOLN
INTRAMUSCULAR | Status: AC
  Filled 2015-04-14: qty 4

## 2015-04-14 MED ORDER — MIDAZOLAM HCL 2 MG/2ML IJ SOLN
INTRAMUSCULAR | Status: AC | PRN
  Administered 2015-04-14 (×3): 1 mg via INTRAVENOUS

## 2015-04-14 MED ORDER — MIDAZOLAM HCL 2 MG/2ML IJ SOLN
INTRAMUSCULAR | Status: AC
  Filled 2015-04-14: qty 2

## 2015-04-14 MED ORDER — FENTANYL CITRATE (PF) 100 MCG/2ML IJ SOLN
INTRAMUSCULAR | Status: AC
  Administered 2015-04-15: 25 ug via INTRAVENOUS
  Filled 2015-04-14: qty 4

## 2015-04-14 MED ORDER — DIGOXIN 0.25 MG/ML IJ SOLN
0.1250 mg | Freq: Every day | INTRAMUSCULAR | Status: DC
  Administered 2015-04-14 – 2015-04-15 (×2): 0.125 mg via INTRAVENOUS
  Filled 2015-04-14: qty 2
  Filled 2015-04-14: qty 1
  Filled 2015-04-14: qty 2
  Filled 2015-04-14: qty 0.5

## 2015-04-14 MED ORDER — ESCITALOPRAM OXALATE 10 MG PO TABS
10.0000 mg | ORAL_TABLET | Freq: Every day | ORAL | Status: DC
  Administered 2015-04-14 – 2015-04-19 (×6): 10 mg via ORAL
  Filled 2015-04-14 (×9): qty 1

## 2015-04-14 MED ORDER — FENTANYL CITRATE (PF) 100 MCG/2ML IJ SOLN
INTRAMUSCULAR | Status: AC | PRN
  Administered 2015-04-14 (×3): 50 ug via INTRAVENOUS

## 2015-04-14 MED ORDER — LIDOCAINE HCL 1 % IJ SOLN
INTRAMUSCULAR | Status: AC
  Filled 2015-04-14: qty 20

## 2015-04-14 NOTE — Sedation Documentation (Signed)
Patient is resting comfortably. 

## 2015-04-14 NOTE — Progress Notes (Signed)
[facility]/William Fischer Progress Note    Subjective: Patient did not get drain placed yesterday.  INR waas still increased.  Objective: Vital signs in last 24 hours: Temp:  [97.4 F (36.3 C)-98.1 F (36.7 C)] 97.4 F (36.3 C) (03/28 0300) Pulse Rate:  [41-140] 71 (03/28 0630) Resp:  [13-27] 19 (03/28 0630) BP: (99-131)/(47-83) 121/83 mmHg (03/28 0630) SpO2:  [91 %-99 %] 95 % (03/28 0630) Weight:  [111.6 kg (246 lb 0.5 oz)] 111.6 kg (246 lb 0.5 oz) (03/28 0611) Last BM Date: 04/13/15  Intake/Output from previous day: 03/27 0701 - 03/28 0700 In: 3046.5 [I.V.:1769.5; Blood:877; IV Piggyback:400] Out: [id number]; Drains:50; Stool:150] Intake/Output this shift:    General: Angry, no acute distress.  Hungry.  Lungs: clear  Abd: Soft, minimally tender.  Extremities: No changes  Neuro: Intact  Lab Results:  @LABLAST2 (wbc:2,hgb:2,hct:2,plt:2) BMET ) Recent Labs  04/13/15 0509 04/14/15 0504  NA 142 142  K 4.4 4.2  CL 104 106  CO2 28 25  GLUCOSE 111* 137*  BUN 26* 25*  CREATININE 2.10* 1.96*  CALCIUM 9.2 9.6   PT/INR  Recent Labs  04/13/15 1040 04/13/15 1730  LABPROT 43.9* 27.7*  INR 4.84* 2.62*   ABG No results for input(s): PHART, HCO3 in the last 72 hours.  Invalid input(s): PCO2, PO2  Studies/Results: No results found.  Anti-infectives: Anti-infectives    Start     Dose/Rate Route Frequency Ordered Stop   04/13/15 1800  vancomycin (VANCOCIN) 1,250 mg in sodium chloride 0.9 % 250 mL IVPB     1,250 mg 166.7 mL/hr over 90 Minutes Intravenous Every 24 hours 04/13/15 0722     04/13/15 1500  anidulafungin (ERAXIS) 100 mg in sodium chloride 0.9 % 100 mL IVPB     100 mg over 90 Minutes Intravenous Every 24 hours 04/12/15 1434     04/12/15 1700  vancomycin (VANCOCIN) 1,500 mg in sodium chloride 0.9 % 500 mL IVPB  Status:  Discontinued     1,500 mg 250 mL/hr over 120 Minutes Intravenous Every 48 hours 04/12/15 1528 04/13/15 0722   04/12/15 1500  anidulafungin  (ERAXIS) 200 mg in sodium chloride 0.9 % 200 mL IVPB     200 mg over 180 Minutes Intravenous  Once 04/12/15 1434 04/12/15 1801   04/12/15 1430  anidulafungin (ERAXIS) 100 mg in sodium chloride 0.9 % 100 mL IVPB  Status:  Discontinued     100 mg over 90 Minutes Intravenous Every 24 hours 04/12/15 1418 04/12/15 1434   04/12/15 1000  piperacillin-tazobactam (ZOSYN) IVPB 3.375 g     3.375 g 12.5 mL/hr over 240 Minutes Intravenous 3 times per day 04/12/15 0545     04/12/15 0345  vancomycin (VANCOCIN) IVPB 1000 mg/200 mL premix     1,000 mg 200 mL/hr over 60 Minutes Intravenous  Once 04/12/15 0334 04/12/15 0843   04/12/15 0130  piperacillin-tazobactam (ZOSYN) IVPB 3.375 g     3.375 g 100 mL/hr over 30 Minutes Intravenous  Once 04/12/15 0115 04/12/15 0215      Assessment/Plan: s/p  IR drain placement today.   Will speak with IR myself today to make sure this takes. Place.  LOS: 2 days   Kathryne Eriksson. Dahlia Bailiff, MD, FACS [phone] [facility] 04/14/2015

## 2015-04-14 NOTE — Progress Notes (Signed)
Patient ID: William Fischer, male   DOB: February 15, 1965, 50 y.o.   MRN: [id number]   Request received for psoas/subdiaphragmatic abscess drain placement Pt had been seen by IR PA 3/26  INR was 2.76 at that time and pt was reluctant to move forward  Now pt is agreeable INR 1.57  Risks and Benefits discussed with the patient including bleeding, infection, damage to adjacent structures, bowel perforation/fistula connection, and sepsis. All of the patient's questions were answered, patient is agreeable to proceed. Consent signed and in chart.

## 2015-04-14 NOTE — Progress Notes (Addendum)
Pt refused am chest x-ray. Pt states "I am not here for a lung problem, I am here for my colostomy surgery." I informed pt x ray was for an abdominal abscess. Pt states " The Doctor knows where the abscess is he do not need another x ray, he can get another one when I get ready to leave."

## 2015-04-14 NOTE — Progress Notes (Signed)
Subjective: Interval History: has complaints nothing being done.  Objective: Vital signs in last 24 hours: Temp:  [97.4 F (36.3 C)-98.1 F (36.7 C)] 97.4 F (36.3 C) (03/28 0300) Pulse Rate:  [41-140] 71 (03/28 0630) Resp:  [13-27] 19 (03/28 0630) BP: (99-131)/(47-83) 121/83 mmHg (03/28 0630) SpO2:  [91 %-99 %] 95 % (03/28 0630) Weight:  [111.6 kg (246 lb 0.5 oz)] 111.6 kg (246 lb 0.5 oz) (03/28 0611) Weight change: 2 kg (4 lb 6.5 oz)  Intake/Output from previous day: 03/27 0701 - 03/28 0700 In: 3046.5 [I.V.:1769.5; Blood:877; IV [id number] Out: [id number]; Drains:50; Stool:150] Intake/Output this shift:    General appearance: alert and angry, discussed issues Neck: RIJ cath Resp: clear to auscultation bilaterally Cardio: irregularly irregular rhythm and systolic murmur: holosystolic 2/6, blowing at apex GI: obese, ostomy R mid abdm, drain Extremities: edema Tr  Lab Results:  Recent Labs  04/13/15 0509 04/14/15 0504  WBC 11.4* 10.4  HGB 9.3* 8.9*  HCT 30.3* 29.8*  PLT 561* 546*   BMET:  Recent Labs  04/13/15 0509 04/14/15 0504  NA 142 142  K 4.4 4.2  CL 104 106  CO2 28 25  GLUCOSE 111* 137*  BUN 26* 25*  CREATININE 2.10* 1.96*  CALCIUM 9.2 9.6   No results for input(s): PTH in the last 72 hours. Iron Studies: No results for input(s): IRON, TIBC, TRANSFERRIN, FERRITIN in the last 72 hours.  Studies/Results: No results found.  I have reviewed the patient's current medications.  Assessment/Plan: 1 AKI improving vol ok.  bp ok.  Acid/base/K ok.  Baseline <1 .  Where with plateau is not clear 2 Anemia 3 Abscess with sepsis on AB , Zosyn/Vanc, plan per surgery 4 Obesity 5 Afib amio P slow ivf, AB.  Little to offer to you at this time, will see again at your request.   LOS: 2 days   Dion Sibal L 04/14/2015,7:02 AM

## 2015-04-14 NOTE — Sedation Documentation (Signed)
Pt continues to endorse discomfort and anxious with procedure. VSS, MD made aware, additional medications given as discussed with Dr. Laurence Ferrari

## 2015-04-14 NOTE — Clinical Documentation Improvement (Signed)
Nephrology/Renal Critical Care  Can the diagnosis of anemia be further specified? Thank you    Iron deficiency Anemia  Nutritional anemia, including the nutrition or mineral deficits  Chronic Anemia, including the suspected or known cause  Anemia of chronic disease, including the associated chronic disease state  Other  Clinically Undetermined     Supporting Information: repeated Intra abdominal infection w drainage, Abdominal abscess    Treatment: Transfused 3 units of FFP   Evaluation: CBC daily   Please exercise your independent, professional judgment when responding. A specific answer is not anticipated or expected.   Thank You,  [place] [phone]

## 2015-04-14 NOTE — Procedures (Signed)
Interventional Radiology Procedure Note  Procedure: Placement of a 97 F biliary drain into the left psoas abscess and then up into the subdiaphragmatic abscess.  Both collections drained with single multi-sidehole tube.  200 mL purulent fluid aspirated.  Sample sent for cx.   Complications: None  Estimated Blood Loss: 0  Recommendations: - JP drain - Flush TID - Cx pending  Signed,  Criselda Peaches, MD

## 2015-04-14 NOTE — Sedation Documentation (Signed)
Patient is resting comfortably. Pt denies any pain, pt is calm and resting

## 2015-04-14 NOTE — Sedation Documentation (Signed)
Pt anxious, requesting additional medication. VSS, Dr. Laurence Ferrari aware and additional medication given as discussed with Dr. Laurence Ferrari

## 2015-04-14 NOTE — Sedation Documentation (Signed)
Patient denies pain and is resting comfortably.  

## 2015-04-14 NOTE — H&P (Signed)
PULMONARY / CRITICAL CARE MEDICINE   Name: William Fischer MRN: [id number] DOB: 07-07-1965    ADMISSION DATE:  04/12/2015 CHIEF COMPLAINT:  Fevers abdominal pain  HISTORY OF PRESENT ILLNESS:   50 yo male with ischemic bowel disease who underwent Ex-lap with left, colectomy, with end colostomy which was complicated by intra-abdominal abscess and rectal stump leak in December of 2016.  Intra-abdominal drains were placed.  He has had multiple hospitalization from intra-abdominal complications.  He was on chronic ciprofloxacin for 3-4 months and this was stopped 4 weeks ago.  Over the last week he has noted increased LLQ abdominal pain with bloating, n/v and profuse ostomy output.  He was febrile at home to 102.  SUBJECTIVE:  No pressors IR drain today  VITAL SIGNS: Temp:  [97.4 F (36.3 C)-98.1 F (36.7 C)] 98.1 F (36.7 C) (03/28 0755) Pulse Rate:  [41-122] 74 (03/28 1210) Resp:  [13-27] 18 (03/28 1210) BP: (102-131)/(47-83) 113/67 mmHg (03/28 1210) SpO2:  [91 %-99 %] 98 % (03/28 1210) Weight:  [111.6 kg (246 lb 0.5 oz)] 111.6 kg (246 lb 0.5 oz) (03/28 0611) HEMODYNAMICS: CVP:  [6 mmHg-13 mmHg] 9 mmHg VENTILATOR SETTINGS:   INTAKE / OUTPUT:  Intake/Output Summary (Last 24 hours) at 04/14/15 1234 Last data filed at 04/14/15 1015  Gross per 24 hour  Intake 2895.69 ml  Output   1000 ml  Net 1895.69 ml    PHYSICAL EXAMINATION: General:  Mild acute distress Neuro:  AAOx3, CN II-XII intact HEENT:  jvd low Cardiovascular:  Irregular rhythm, no m/r/g Lungs:  CTA  Abdomen:  Soft, TTP LLQ, no rebound or guarding, bs wnl, Right colostomy pink and draining, LLQ drains with purulent fluid.  Midline abdominal wound - pink, granulated clean Musculoskeletal:  Normal bulk and tone Skin:  No c/c/e  LABS:  CBC  Recent Labs Lab 04/12/15 2331 04/13/15 0509 04/14/15 0504  WBC 12.6* 11.4* 10.4  HGB 10.0* 9.3* 8.9*  HCT 30.9* 30.3* 29.8*  PLT 560* 561* 546*   Coag's  Recent  Labs Lab 04/13/15 1040 04/13/15 1730 04/14/15 0807  APTT  --  49*  --   INR 4.84* 2.62* 1.57*   BMET  Recent Labs Lab 04/12/15 2331 04/13/15 0509 04/14/15 0504  NA 141 142 142  K 4.6 4.4 4.2  CL 106 104 106  CO2 26 28 25   BUN 27* 26* 25*  CREATININE 2.24* 2.10* 1.96*  GLUCOSE 128* 111* 137*   Electrolytes  Recent Labs Lab 04/12/15 1430 04/12/15 2331 04/13/15 0509 04/14/15 0504  CALCIUM 9.3 9.2 9.2 9.6  MG 2.0  --   --   --   PHOS 5.1*  --   --  3.5   Sepsis Markers  Recent Labs Lab 04/12/15 0238 04/12/15 0414 04/12/15 1430  LATICACIDVEN 1.79 1.54 0.8  PROCALCITON  --   --  0.55   ABG No results for input(s): PHART, PCO2ART, PO2ART in the last 168 hours. Liver Enzymes  Recent Labs Lab 04/11/15 2358 04/12/15 1430 04/14/15 0504  AST 22 14* 13*  ALT 44 31 23  ALKPHOS 86 68 59  BILITOT 0.4 0.3 0.6  ALBUMIN 2.9* 2.3* 2.5*   Cardiac Enzymes No results for input(s): TROPONINI, PROBNP in the last 168 hours. Glucose  Recent Labs Lab 04/13/15 1121 04/13/15 1600 04/13/15 2002 04/13/15 2350 04/14/15 0312 04/14/15 0753  GLUCAP 107* 118* 142* 141* 117* 131*    Imaging No results found.   ASSESSMENT / PLAN: 50 yo male with persistent  hypotension and AKI likely hypovolemic, pre-renal azotemia and septic shock.  PULMONARY A: at risk atx, int prominence  Even balance goals IS  CARDIOVASCULAR A:  Atrial fibrilation - rate NOT controlled Septic Shock Rel AI noted High risk SIRS post drain P:  -tele -levophed to map 60-65 -amio drip, likely can reduce , evaluate post drain  -dig daily  -stress riods, and florinef for now, in am may lower - icu stay post drain  RENAL A:   AKI likley ATN, sepsis P:   -per renal -even goals, pcxr int prominence  GASTROINTESTINAL A:   Chronic intra-abdominal abscess Increased ostomy output P:   - surgery note reviewed - GI pathogen panel - neg - Cdiff- neg - IR consult for drain placement,  now  HEMATOLOGIC A:   Leukocytosis coagulapthy (sepsis) - resolved P:  -Repeat coags -cbc in am   INFECTIOUS A:  Septic shock with AKI - source intraabdominal  P:   BCx2 04/12/2015 UC 04/12/2015 Sputum N/A Abx:  3/25 Vanc>>> 3/25 zosyn>>> 3/2 5 eraxis>>>  follow drain culture  ENDOCRINE A:   Hyperglycemia P:   SSI BG goal <180  NEUROLOGIC A:    Monitor pain post drain  PSYCH: A: Major depressive episode - surrounding circumstances of chronic abdominal issues  P: - suicidal without plan, call psych today - SSRI   Lavon Paganini. Titus Mould, MD, [place] Pgr: [facility]

## 2015-04-14 NOTE — Consult Note (Signed)
[facility] Consult   Reason for Consult:  depression Referring Physician:  Dr. Titus Mould Patient Identification: William Fischer MRN:  [id number] Principal Diagnosis: Other depression due to general medical condition Diagnosis:   Patient Active Problem List   Diagnosis Date Noted  . 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 Abdominal abscess ([place]) [K65.1]   . Hyperkalemia [E87.5]   . Fever [R50.9]   . Intra-abdominal infection [B99.9]   . Pain in the abdomen [R10.9]   . Abdominal pain [R10.9] 04/12/2015  . Septic shock ([place]) [A41.9, R65.21] 04/12/2015  . 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 Abscess of abdominal cavity ([place]) [K65.1] 02/22/2015  . Acute venous embolism and thrombosis of deep vessels of proximal lower extremity ([place]) [I82.4Y9] [I82.4Y9] 02/11/2015  . Long term (current) use of anticoagulants [Z79.01] [Z79.01] 02/11/2015  . Monitoring for long-term anticoagulant use [Z79.01] 02/11/2015  . Intra-abdominal abscess ([place]) [K65.1]   . CHF (congestive heart failure) ([place]) [I50.9]   . Tachypnea [R06.82]   . Bowel perforation ([place]) [K63.1]   . Pressure ulcer [L89.90] 01/26/2015  . Pulmonary edema [J81.1]   . Dyspnea [R06.00]   . Pleural effusion [J90]   . Perforated bowel ([place]) [K63.1]   . Respiratory failure ([place]) [J96.90]   . Atrial fibrillation with rapid ventricular response ([place]) [I48.91]   . Systolic dysfunction with acute on chronic heart failure ([place]) [I50.23]   . Hypoxia [R09.02]   . Diverticulitis of colon with perforation [K57.20] 01/14/2015  . AKI (acute kidney injury) ([place]) [N17.9]   . Atrial fibrillation ([place]) [I48.91] 12/08/2011  . Chronic systolic heart failure ([place]) [I50.22] 12/08/2011  . Acute systolic heart failure ([place]) [I50.21] 12/03/2011  . Morbid obesity ([place]) [E66.01] 12/03/2011  . Depression with anxiety [F41.8] 06/29/2011  . Obesity [E66.9] 06/29/2011    Total Time spent with patient:  1 hour  Subjective:   William Fischer is a 50 y.o. male patient admitted with abdominal pain with bloating and depression.  HPI:  William Fischer is a 50 years old divorced male admitted to [facility] with multiple gastrointestinal symptoms and abdominal pain. Patient is seen face-to-face for psychiatric consultation and evaluation of depression and reviewed available medical records and also case discussed with Dr. Titus Mould. Patient reported feeling depressed, sad, emotional, irritable and finds himself argumentative with the staff in the hospital and reportedly taking his status on staff. Patient reportedly worried about his 72 years old son who has been acting out in school, suspended and currently relocated to [place] from public schooling. Patient feels regrets about not being there for his son when he needs him. Reportedly he did not do well with his mother and stepfather and has been more to his home 6 months ago. Reportedly he has been skipping school, not doing well in school and has multiple behavioral problems and academic difficulties. Patient has been worried about his medical problems including abdominal abscess, septic shocks and multiple gastrointestinal symptoms and recurrence of problems and repeated multiple acute medical hospitalizations. Patient denies symptoms of mania, psychosis, suicidal/homicidal ideation, intention or plans. Patient believes his son needed psychiatric services when he is able to complete his treatment and stays stable emotionally. Patient stated he is religious and want to get a constant services from the church.  Past Psychiatric History: Patient has no previous history of psychiatric problems or inpatient hospitalizations.  Risk to Self: Is patient at risk for suicide?: No Risk to Others:   Prior Inpatient  Therapy:   Prior Outpatient Therapy:    Past Medical History:  Past Medical History  Diagnosis Date  . Chicken pox as a child  .  Migraine 06/29/2011  . Obesity 06/29/2011  . Depression with anxiety 06/29/2011  . Atrial fibrillation ([place])     admx 11/13 with acute sCHF in setting of RVR  => a. failed DCCV x 2; b. Pradaxa started;  c. failed sotalol  . Chronic systolic heart failure ([place])     a. echo 11/13: Ef 40-45%, diff HK, mod MR, mod LAE, mild RVE, mod RAE, small effusion;   b. TEE 11/13:  EF 35-40%, no LAA clot; Echo 2/14 shows normal EF  . Cardiomyopathy ([place])     likely tachy mediated in setting of AF with RVR  . Snoring     patient needs sleep study - has declined  . Allergy to IVP dye   . Chronic anticoagulation     Pradaxa    Past Surgical History  Procedure Laterality Date  . Tee without cardioversion  11/30/2011    Procedure: TRANSESOPHAGEAL ECHOCARDIOGRAM (TEE);  Surgeon: Thayer Headings, MD;  Location: [facility];  Service: Cardiovascular;  Laterality: N/A;  . Cardioversion  11/30/2011    Procedure: CARDIOVERSION;  Surgeon: Thayer Headings, MD;  Location: [facility];  Service: Cardiovascular;  Laterality: N/A;  . Cardioversion  12/03/2011    Procedure: CARDIOVERSION;  Surgeon: Jolaine Artist, MD;  Location: [facility];  Service: Cardiovascular;  Laterality: N/A;  . Laparotomy N/A 01/17/2015    Procedure: EXPLORATORY LAPAROTOMY WITH LEFT COLECTOMY AND COLOSTOMY;  Surgeon: Rolm Bookbinder, MD;  Location: [facility];  Service: General;  Laterality: N/A;   Family History:  Family History  Problem Relation Age of Onset  . Hypertension Mother   . Hypertension Father   . Aneurysm Father   . Alcohol abuse Father   . Cancer Maternal Grandmother     brain  . Diabetes Maternal Grandfather     type 2  . Parkinsonism Maternal Grandfather   . Cancer Paternal Grandmother     breast  . Diabetes Paternal Grandmother   . Alcohol abuse Paternal Grandfather    Family Psychiatric  History: Patient is currently single and divorced. Patient has 2 sons, 33 is holding well and 58 years old has been suffering with  chronic behavioral and emotional problems and currently no medication management. Social History:  History  Alcohol Use No     History  Drug Use No    Social History   Social History  . Marital Status: Divorced    Spouse Name: N/A  . Number of Children: N/A  . Years of Education: N/A   Social History Main Topics  . Smoking status: Former Smoker -- 1.00 packs/day for 20 years    Types: Cigarettes    Quit date: 07/10/2014  . Smokeless tobacco: Never Used  . Alcohol Use: No  . Drug Use: No  . Sexual Activity: No   Other Topics Concern  . None   Social History Narrative   Additional Social History:    Allergies:   Allergies  Allergen Reactions  . Contrast Media [Iodinated Diagnostic Agents] Rash    a diffuse macular rash after CTA chest  Pt was premedicated with 121m IV Solumedrol, 564mIV Benadryl 1 hr prior to CTexam, and tolerated procedure without any difficulties. 11/28/11 Also same pre med protocol observed on 02/22/15 and pt tolerated the procedure well    Labs:  Results for orders  placed or performed during the hospital encounter of 04/12/15 (from the past 48 hour(s))  Glucose, capillary     Status: Abnormal   Collection Time: 04/12/15  1:52 PM  Result Value Ref Range   Glucose-Capillary 129 (H) 65 - 99 mg/dL  CBC     Status: Abnormal   Collection Time: 04/12/15  2:30 PM  Result Value Ref Range   WBC 15.7 (H) 4.0 - 10.5 K/uL   RBC 3.33 (L) 4.22 - 5.81 MIL/uL   Hemoglobin 10.2 (L) 13.0 - 17.0 g/dL   HCT 30.8 (L) 39.0 - 52.0 %   MCV 92.5 78.0 - 100.0 fL   MCH 30.6 26.0 - 34.0 pg   MCHC 33.1 30.0 - 36.0 g/dL   RDW 16.5 (H) 11.5 - 15.5 %   Platelets 550 (H) 150 - 400 K/uL  Comprehensive metabolic panel     Status: Abnormal   Collection Time: 04/12/15  2:30 PM  Result Value Ref Range   Sodium 138 135 - 145 mmol/L    Comment: DELTA CHECK NOTED   Potassium 4.8 3.5 - 5.1 mmol/L    Comment: DELTA CHECK NOTED   Chloride 105 101 - 111 mmol/L   CO2 24 22 -  32 mmol/L   Glucose, Bld 136 (H) 65 - 99 mg/dL   BUN 35 (H) 6 - 20 mg/dL   Creatinine, Ser 2.78 (H) 0.61 - 1.24 mg/dL    Comment: DELTA CHECK NOTED   Calcium 9.3 8.9 - 10.3 mg/dL   Total Protein 7.1 6.5 - 8.1 g/dL   Albumin 2.3 (L) 3.5 - 5.0 g/dL   AST 14 (L) 15 - 41 U/L   ALT 31 17 - 63 U/L   Alkaline Phosphatase 68 38 - 126 U/L   Total Bilirubin 0.3 0.3 - 1.2 mg/dL   GFR calc non Af Amer 26 (L) >60 mL/min   GFR calc Af Amer 30 (L) >60 mL/min    Comment: (NOTE) The eGFR has been calculated using the CKD EPI equation. This calculation has not been validated in all clinical situations. eGFR's persistently <60 mL/min signify possible Chronic Kidney Disease.    Anion gap 9 5 - 15  Magnesium     Status: None   Collection Time: 04/12/15  2:30 PM  Result Value Ref Range   Magnesium 2.0 1.7 - 2.4 mg/dL  Lactic acid, plasma     Status: None   Collection Time: 04/12/15  2:30 PM  Result Value Ref Range   Lactic Acid, Venous 0.8 0.5 - 2.0 mmol/L  Procalcitonin     Status: None   Collection Time: 04/12/15  2:30 PM  Result Value Ref Range   Procalcitonin 0.55 ng/mL    Comment:        Interpretation: PCT > 0.5 ng/mL and <= 2 ng/mL: Systemic infection (sepsis) is possible, but other conditions are known to elevate PCT as well. (NOTE)         ICU PCT Algorithm               Non ICU PCT Algorithm    ----------------------------     ------------------------------         PCT < 0.25 ng/mL                 PCT < 0.1 ng/mL     Stopping of antibiotics            Stopping of antibiotics       strongly encouraged.  strongly encouraged.    ----------------------------     ------------------------------       PCT level decrease by               PCT < 0.25 ng/mL       >= 80% from peak PCT       OR PCT 0.25 - 0.5 ng/mL          Stopping of antibiotics                                             encouraged.     Stopping of antibiotics           encouraged.     ----------------------------     ------------------------------       PCT level decrease by              PCT >= 0.25 ng/mL       < 80% from peak PCT        AND PCT >= 0.5 ng/mL             Continuing antibiotics                                              encouraged.       Continuing antibiotics            encouraged.    ----------------------------     ------------------------------     PCT level increase compared          PCT > 0.5 ng/mL         with peak PCT AND          PCT >= 0.5 ng/mL             Escalation of antibiotics                                          strongly encouraged.      Escalation of antibiotics        strongly encouraged.   Cortisol     Status: None   Collection Time: 04/12/15  2:30 PM  Result Value Ref Range   Cortisol, Plasma 12.7 ug/dL    Comment: (NOTE) AM    6.7 - 22.6 ug/dL PM   <10.0       ug/dL   Phosphorus     Status: Abnormal   Collection Time: 04/12/15  2:30 PM  Result Value Ref Range   Phosphorus 5.1 (H) 2.5 - 4.6 mg/dL  MRSA PCR Screening     Status: None   Collection Time: 04/12/15  2:33 PM  Result Value Ref Range   MRSA by PCR NEGATIVE NEGATIVE    Comment:        The GeneXpert MRSA Assay (FDA approved for NASAL specimens only), is one component of a comprehensive MRSA colonization surveillance program. It is not intended to diagnose MRSA infection nor to guide or monitor treatment for MRSA infections.   Glucose, capillary     Status: Abnormal   Collection Time: 04/12/15  3:50 PM  Result Value Ref Range   Glucose-Capillary 118 (H) 65 - 99 mg/dL  Glucose, capillary     Status: Abnormal   Collection Time: 04/12/15  8:11 PM  Result Value Ref Range   Glucose-Capillary 110 (H) 65 - 99 mg/dL  CBC     Status: Abnormal   Collection Time: 04/12/15 11:31 PM  Result Value Ref Range   WBC 12.6 (H) 4.0 - 10.5 K/uL   RBC 3.31 (L) 4.22 - 5.81 MIL/uL   Hemoglobin 10.0 (L) 13.0 - 17.0 g/dL   HCT 30.9 (L) 39.0 - 52.0 %   MCV 93.4 78.0 -  100.0 fL   MCH 30.2 26.0 - 34.0 pg   MCHC 32.4 30.0 - 36.0 g/dL   RDW 16.9 (H) 11.5 - 15.5 %   Platelets 560 (H) 150 - 400 K/uL  Basic metabolic panel     Status: Abnormal   Collection Time: 04/12/15 11:31 PM  Result Value Ref Range   Sodium 141 135 - 145 mmol/L   Potassium 4.6 3.5 - 5.1 mmol/L   Chloride 106 101 - 111 mmol/L   CO2 26 22 - 32 mmol/L   Glucose, Bld 128 (H) 65 - 99 mg/dL   BUN 27 (H) 6 - 20 mg/dL   Creatinine, Ser 2.24 (H) 0.61 - 1.24 mg/dL   Calcium 9.2 8.9 - 10.3 mg/dL   GFR calc non Af Amer 33 (L) >60 mL/min   GFR calc Af Amer 38 (L) >60 mL/min    Comment: (NOTE) The eGFR has been calculated using the CKD EPI equation. This calculation has not been validated in all clinical situations. eGFR's persistently <60 mL/min signify possible Chronic Kidney Disease.    Anion gap 9 5 - 15  Glucose, capillary     Status: Abnormal   Collection Time: 04/13/15 12:16 AM  Result Value Ref Range   Glucose-Capillary 125 (H) 65 - 99 mg/dL   Comment 1 Notify RN   Glucose, capillary     Status: Abnormal   Collection Time: 04/13/15  3:41 AM  Result Value Ref Range   Glucose-Capillary 108 (H) 65 - 99 mg/dL   Comment 1 Notify RN   CBC     Status: Abnormal   Collection Time: 04/13/15  5:09 AM  Result Value Ref Range   WBC 11.4 (H) 4.0 - 10.5 K/uL   RBC 3.24 (L) 4.22 - 5.81 MIL/uL   Hemoglobin 9.3 (L) 13.0 - 17.0 g/dL   HCT 30.3 (L) 39.0 - 52.0 %   MCV 93.5 78.0 - 100.0 fL   MCH 28.7 26.0 - 34.0 pg   MCHC 30.7 30.0 - 36.0 g/dL   RDW 16.9 (H) 11.5 - 15.5 %   Platelets 561 (H) 150 - 400 K/uL  Basic metabolic panel     Status: Abnormal   Collection Time: 04/13/15  5:09 AM  Result Value Ref Range   Sodium 142 135 - 145 mmol/L   Potassium 4.4 3.5 - 5.1 mmol/L   Chloride 104 101 - 111 mmol/L   CO2 28 22 - 32 mmol/L   Glucose, Bld 111 (H) 65 - 99 mg/dL   BUN 26 (H) 6 - 20 mg/dL   Creatinine, Ser 2.10 (H) 0.61 - 1.24 mg/dL   Calcium 9.2 8.9 - 10.3 mg/dL   GFR calc non Af Amer  36 (L) >60 mL/min   GFR calc Af Amer 42 (L) >60 mL/min    Comment: (NOTE) The eGFR has been calculated using the CKD EPI equation. This calculation has not been validated in all clinical situations. eGFR's persistently <60  mL/min signify possible Chronic Kidney Disease.    Anion gap 10 5 - 15  Glucose, capillary     Status: Abnormal   Collection Time: 04/13/15  7:45 AM  Result Value Ref Range   Glucose-Capillary 104 (H) 65 - 99 mg/dL  Protime-INR     Status: Abnormal   Collection Time: 04/13/15 10:40 AM  Result Value Ref Range   Prothrombin Time 43.9 (H) 11.6 - 15.2 seconds   INR 4.84 (H) 0.00 - 1.49  Glucose, capillary     Status: Abnormal   Collection Time: 04/13/15 11:21 AM  Result Value Ref Range   Glucose-Capillary 107 (H) 65 - 99 mg/dL  Prepare fresh frozen plasma     Status: None   Collection Time: 04/13/15 12:26 PM  Result Value Ref Range   Unit Number [id number]    Blood Component Type THWPLS APHR1    Unit division 00    Status of Unit ISSUED,FINAL    Transfusion Status OK TO TRANSFUSE    Unit Number [id number]    Blood Component Type THW PLS APHR    Unit division 00    Status of Unit ISSUED,FINAL    Transfusion Status OK TO TRANSFUSE    Unit Number [id number]    Blood Component Type THAWED PLASMA    Unit division 00    Status of Unit ISSUED,FINAL    Transfusion Status OK TO TRANSFUSE    Unit Number [id number]    Blood Component Type THWPLS APHR2    Unit division 00    Status of Unit ISSUED,FINAL    Transfusion Status OK TO TRANSFUSE   Digoxin level     Status: Abnormal   Collection Time: 04/13/15 12:35 PM  Result Value Ref Range   Digoxin Level <0.2 (L) 0.8 - 2.0 ng/mL    Comment: RESULTS CONFIRMED BY MANUAL DILUTION  Glucose, capillary     Status: Abnormal   Collection Time: 04/13/15  4:00 PM  Result Value Ref Range   Glucose-Capillary 118 (H) 65 - 99 mg/dL  Protime-INR     Status: Abnormal   Collection Time: 04/13/15  5:30 PM  Result  Value Ref Range   Prothrombin Time 27.7 (H) 11.6 - 15.2 seconds   INR 2.62 (H) 0.00 - 1.49  APTT     Status: Abnormal   Collection Time: 04/13/15  5:30 PM  Result Value Ref Range   aPTT 49 (H) 24 - 37 seconds    Comment:        IF BASELINE aPTT IS ELEVATED, SUGGEST PATIENT RISK ASSESSMENT BE USED TO DETERMINE APPROPRIATE ANTICOAGULANT THERAPY.   Glucose, capillary     Status: Abnormal   Collection Time: 04/13/15  8:02 PM  Result Value Ref Range   Glucose-Capillary 142 (H) 65 - 99 mg/dL  Glucose, capillary     Status: Abnormal   Collection Time: 04/13/15 11:50 PM  Result Value Ref Range   Glucose-Capillary 141 (H) 65 - 99 mg/dL  Glucose, capillary     Status: Abnormal   Collection Time: 04/14/15  3:12 AM  Result Value Ref Range   Glucose-Capillary 117 (H) 65 - 99 mg/dL  Comprehensive metabolic panel     Status: Abnormal   Collection Time: 04/14/15  5:04 AM  Result Value Ref Range   Sodium 142 135 - 145 mmol/L   Potassium 4.2 3.5 - 5.1 mmol/L   Chloride 106 101 - 111 mmol/L   CO2 25 22 - 32 mmol/L   Glucose, Bld 137 (H)  65 - 99 mg/dL   BUN 25 (H) 6 - 20 mg/dL   Creatinine, Ser 1.96 (H) 0.61 - 1.24 mg/dL   Calcium 9.6 8.9 - 10.3 mg/dL   Total Protein 7.1 6.5 - 8.1 g/dL   Albumin 2.5 (L) 3.5 - 5.0 g/dL   AST 13 (L) 15 - 41 U/L   ALT 23 17 - 63 U/L   Alkaline Phosphatase 59 38 - 126 U/L   Total Bilirubin 0.6 0.3 - 1.2 mg/dL   GFR calc non Af Amer 39 (L) >60 mL/min   GFR calc Af Amer 45 (L) >60 mL/min    Comment: (NOTE) The eGFR has been calculated using the CKD EPI equation. This calculation has not been validated in all clinical situations. eGFR's persistently <60 mL/min signify possible Chronic Kidney Disease.    Anion gap 11 5 - 15  CBC with Differential/Platelet     Status: Abnormal   Collection Time: 04/14/15  5:04 AM  Result Value Ref Range   WBC 10.4 4.0 - 10.5 K/uL   RBC 3.12 (L) 4.22 - 5.81 MIL/uL   Hemoglobin 8.9 (L) 13.0 - 17.0 g/dL   HCT 29.8 (L) 39.0 -  52.0 %   MCV 95.5 78.0 - 100.0 fL   MCH 28.5 26.0 - 34.0 pg   MCHC 29.9 (L) 30.0 - 36.0 g/dL   RDW 16.8 (H) 11.5 - 15.5 %   Platelets 546 (H) 150 - 400 K/uL   Neutrophils Relative % 85 %   Neutro Abs 8.9 (H) 1.7 - 7.7 K/uL   Lymphocytes Relative 10 %   Lymphs Abs 1.0 0.7 - 4.0 K/uL   Monocytes Relative 5 %   Monocytes Absolute 0.5 0.1 - 1.0 K/uL   Eosinophils Relative 0 %   Eosinophils Absolute 0.0 0.0 - 0.7 K/uL   Basophils Relative 0 %   Basophils Absolute 0.0 0.0 - 0.1 K/uL  Phosphorus     Status: None   Collection Time: 04/14/15  5:04 AM  Result Value Ref Range   Phosphorus 3.5 2.5 - 4.6 mg/dL  Glucose, capillary     Status: Abnormal   Collection Time: 04/14/15  7:53 AM  Result Value Ref Range   Glucose-Capillary 131 (H) 65 - 99 mg/dL  Protime-INR     Status: Abnormal   Collection Time: 04/14/15  8:07 AM  Result Value Ref Range   Prothrombin Time 18.8 (H) 11.6 - 15.2 seconds   INR 1.57 (H) 0.00 - 1.49    Current Facility-Administered Medications  Medication Dose Route Frequency Provider Last Rate Last Dose  . 0.9 %  sodium chloride infusion  250 mL Intravenous PRN Juanito Doom, MD      . 0.9 %  sodium chloride infusion   Intravenous Continuous Raylene Miyamoto, MD 50 mL/hr at 04/13/15 0719 1,000 mL at 04/13/15 0719  . acetaminophen (TYLENOL) tablet 650 mg  650 mg Oral Q4H PRN Juanito Doom, MD      . amiodarone (NEXTERONE PREMIX) 360 MG/200ML (1.8 mg/mL) IV infusion  30 mg/hr Intravenous Continuous Raylene Miyamoto, MD 16.7 mL/hr at 04/14/15 0051 30 mg/hr at 04/14/15 0051  . anidulafungin (ERAXIS) 100 mg in sodium chloride 0.9 % 100 mL IVPB  100 mg Intravenous Q24H Juanito Doom, MD   100 mg at 04/13/15 1548  . antiseptic oral rinse (CPC / CETYLPYRIDINIUM CHLORIDE 0.05%) solution 7 mL  7 mL Mouth Rinse BID Juanito Doom, MD   7 mL at 04/12/15 2203  .  digoxin (LANOXIN) 0.25 MG/ML injection 0.125 mg  0.125 mg Intravenous Daily Raylene Miyamoto, MD       . diphenhydrAMINE-zinc acetate (BENADRYL) 2-0.1 % cream   Topical TID PRN Anders Simmonds, MD      . fentaNYL (SUBLIMAZE) 100 MCG/2ML injection           . fentaNYL (SUBLIMAZE) injection 25 mcg  25 mcg Intravenous Q2H PRN Raylene Miyamoto, MD   25 mcg at 04/14/15 0552  . hydrocortisone sodium succinate (SOLU-CORTEF) 100 MG injection 50 mg  50 mg Intravenous Q6H Raylene Miyamoto, MD   50 mg at 04/14/15 0600  . insulin aspart (novoLOG) injection 0-15 Units  0-15 Units Subcutaneous 6 times per day Juanito Doom, MD   2 Units at 04/14/15 0808  . lidocaine (XYLOCAINE) 1 % (with pres) injection           . midazolam (VERSED) 2 MG/2ML injection           . neomycin-bacitracin-polymyxin (NEOSPORIN) ointment   Topical Daily Judeth Horn, MD   1 application at 46/96/29 1032  . norepinephrine (LEVOPHED) 4 mg in dextrose 5 % 250 mL (0.016 mg/mL) infusion  2-50 mcg/min Intravenous Continuous Juanito Doom, MD   Stopped at 04/13/15 2200  . ondansetron (ZOFRAN) injection 4 mg  4 mg Intravenous Q6H PRN Juanito Doom, MD      . piperacillin-tazobactam (ZOSYN) IVPB 3.375 g  3.375 g Intravenous 3 times per day Franky Macho, RPH 12.5 mL/hr at 04/14/15 0552 3.375 g at 04/14/15 0552  . vancomycin (VANCOCIN) 1,250 mg in sodium chloride 0.9 % 250 mL IVPB  1,250 mg Intravenous Q24H Valeda Malm Rumbarger, RPH   1,250 mg at 04/13/15 1819  . zolpidem (AMBIEN) tablet 5 mg  5 mg Oral QHS PRN Rush Farmer, MD        Musculoskeletal: Strength & Muscle Tone: decreased Gait & Station: unable to stand Patient leans: N/A  Psychiatric Specialty Exam: ROS multiple gastrointestinal symptoms including nausea, vomiting, abdominal pain but denied chest pain and shortness of breath. No Fever-chills, No Headache, No changes with Vision or hearing, reports vertigo No problems swallowing food or Liquids, No Chest pain, Cough or Shortness of Breath, No Abdominal pain, No Nausea or Vommitting, Bowel movements are  regular, No Blood in stool or Urine, No dysuria, No new skin rashes or bruises, No new joints pains-aches,  No new weakness, tingling, numbness in any extremity, No recent weight gain or loss, No polyuria, polydypsia or polyphagia,  A full 10 point Review of Systems was done, except as stated above, all other Review of Systems were negative.  Blood pressure 111/62, pulse 84, temperature 98.1 F (36.7 C), temperature source Oral, resp. rate 24, height 5' 10"  (1.778 m), weight 111.6 kg (246 lb 0.5 oz), SpO2 96 %.Body mass index is 35.3 kg/(m^2).  General Appearance: Casual  Eye Contact::  Good  Speech:  Clear and Coherent  Volume:  Decreased  Mood:  Anxious and Depressed  Affect:  Constricted and Depressed  Thought Process:  Coherent and Goal Directed  Orientation:  Full (Time, Place, and Person)  Thought Content:  WDL and Rumination  Suicidal Thoughts:  No  Homicidal Thoughts:  No  Memory:  Immediate;   Good Recent;   Fair Remote;   Fair  Judgement:  Intact  Insight:  Fair  Psychomotor Activity:  Decreased  Concentration:  Good  Recall:  Good  Fund of Knowledge:Good  Language: Good  Akathisia:  Negative  Handed:  Right  AIMS (if indicated):     Assets:  Communication Skills Desire for Improvement Financial Resources/Insurance Housing Leisure Time Resilience Social Support Transportation  ADL's:  Impaired  Cognition: WNL  Sleep:      Treatment Plan Summary: Safety concerns: Patient has no safety concerns and contract for safety Patient consented for exercise after brief discussion of risks and benefits We'll start Lexapro 10 mg daily for depression and anxiety Patient will continue Ambien 5 mg at bedtime for insomnia We will encourage religious counseling services Appreciate psychiatric consultation and follow up as clinically required Please contact [phone] or [phone] if needs further assistance  Disposition: Patient will be referred to the outpatient  medication management when medically stable Patient does not meet criteria for psychiatric inpatient admission. Supportive therapy provided about ongoing stressors.  Durward Parcel., MD 04/14/2015 1:33 PM

## 2015-04-15 ENCOUNTER — Inpatient Hospital Stay (HOSPITAL_COMMUNITY): Payer: Medicaid Other

## 2015-04-15 DIAGNOSIS — R1010 Upper abdominal pain, unspecified: Secondary | ICD-10-CM

## 2015-04-15 DIAGNOSIS — F329 Major depressive disorder, single episode, unspecified: Secondary | ICD-10-CM

## 2015-04-15 LAB — COMPREHENSIVE METABOLIC PANEL
ALT: 18 U/L (ref 17–63)
ANION GAP: 8 (ref 5–15)
AST: 13 U/L — ABNORMAL LOW (ref 15–41)
Albumin: 2.3 g/dL — ABNORMAL LOW (ref 3.5–5.0)
Alkaline Phosphatase: 55 U/L (ref 38–126)
BILIRUBIN TOTAL: 0.4 mg/dL (ref 0.3–1.2)
BUN: 21 mg/dL — ABNORMAL HIGH (ref 6–20)
CO2: 28 mmol/L (ref 22–32)
Calcium: 9.3 mg/dL (ref 8.9–10.3)
Chloride: 104 mmol/L (ref 101–111)
Creatinine, Ser: 1.45 mg/dL — ABNORMAL HIGH (ref 0.61–1.24)
GFR calc Af Amer: >60 mL/min (ref >60)
GFR, EST NON AFRICAN AMERICAN: 56 mL/min — AB (ref >60)
Glucose, Bld: 137 mg/dL — ABNORMAL HIGH (ref 65–99)
POTASSIUM: 3.7 mmol/L (ref 3.5–5.1)
Sodium: 140 mmol/L (ref 135–145)
TOTAL PROTEIN: 6.6 g/dL (ref 6.5–8.1)

## 2015-04-15 LAB — PROTIME-INR
INR: 1.53 — ABNORMAL HIGH (ref 0.00–1.49)
PROTHROMBIN TIME: 18.5 s — AB (ref 11.6–15.2)

## 2015-04-15 LAB — GLUCOSE, CAPILLARY
GLUCOSE-CAPILLARY: 116 mg/dL — AB (ref 65–99)
GLUCOSE-CAPILLARY: 133 mg/dL — AB (ref 65–99)
Glucose-Capillary: 183 mg/dL — ABNORMAL HIGH (ref 65–99)
Glucose-Capillary: 157 mg/dL — ABNORMAL HIGH (ref 65–99)
Glucose-Capillary: 141 mg/dL — ABNORMAL HIGH (ref 65–99)
Glucose-Capillary: 149 mg/dL — ABNORMAL HIGH (ref 65–99)

## 2015-04-15 MED ORDER — VANCOMYCIN HCL IN DEXTROSE 750-5 MG/150ML-% IV SOLN
750.0000 mg | Freq: Two times a day (BID) | INTRAVENOUS | Status: DC
  Administered 2015-04-15: 750 mg via INTRAVENOUS
  Filled 2015-04-15: qty 150

## 2015-04-15 MED ORDER — WARFARIN - PHARMACIST DOSING INPATIENT: Freq: Every day | Status: DC

## 2015-04-15 MED ORDER — DIGOXIN 125 MCG PO TABS
0.1250 mg | ORAL_TABLET | Freq: Every day | ORAL | Status: DC
  Administered 2015-04-16 – 2015-04-19 (×4): 0.125 mg via ORAL
  Filled 2015-04-15 (×5): qty 1

## 2015-04-15 MED ORDER — WARFARIN SODIUM 7.5 MG PO TABS
7.5000 mg | ORAL_TABLET | Freq: Once | ORAL | Status: AC
  Administered 2015-04-15: 7.5 mg via ORAL
  Filled 2015-04-15: qty 1

## 2015-04-15 MED ORDER — HYDROCORTISONE NA SUCCINATE PF 100 MG IJ SOLR
50.0000 mg | Freq: Two times a day (BID) | INTRAMUSCULAR | Status: DC
  Administered 2015-04-15 – 2015-04-16 (×2): 50 mg via INTRAVENOUS
  Filled 2015-04-15 (×3): qty 1

## 2015-04-15 MED ORDER — HEPARIN (PORCINE) IN NACL 100-0.45 UNIT/ML-% IJ SOLN
2500.0000 [IU]/h | INTRAMUSCULAR | Status: DC
  Administered 2015-04-15: 1600 [IU]/h via INTRAVENOUS
  Administered 2015-04-16: 2000 [IU]/h via INTRAVENOUS
  Administered 2015-04-17: 2500 [IU]/h via INTRAVENOUS
  Filled 2015-04-15 (×6): qty 250

## 2015-04-15 MED ORDER — VANCOMYCIN HCL IN DEXTROSE 750-5 MG/150ML-% IV SOLN
750.0000 mg | Freq: Two times a day (BID) | INTRAVENOUS | Status: DC
  Administered 2015-04-15: 750 mg via INTRAVENOUS
  Filled 2015-04-15 (×3): qty 150

## 2015-04-15 NOTE — Progress Notes (Signed)
[facility]   Reason for visit: Follow up on intraabdominal abscess  Interval History: GPC on gram stain, afebrile, drain placed yesterday with 200 cc purulent fluid.    Physical Exam: Constitutional:  Filed Vitals:   04/15/15 0900 04/15/15 1000  BP: 126/77 131/98  Pulse: 82 103  Temp:    Resp: 17 26   patient appears in NAD Respiratory: Normal respiratory effort; CTA B Cardiovascular: RRR GI: left drain X3 and 1 more superior  Review of Systems: Constitutional: negative for fevers and chills Gastrointestinal: negative for nausea and diarrhea  Lab Results  Component Value Date   WBC 10.4 04/14/2015   HGB 8.9* 04/14/2015   HCT 29.8* 04/14/2015   MCV 95.5 04/14/2015   PLT 546* 04/14/2015    Lab Results  Component Value Date   CREATININE 1.45* 04/15/2015   BUN 21* 04/15/2015   NA 140 04/15/2015   K 3.7 04/15/2015   CL 104 04/15/2015   CO2 28 04/15/2015    Lab Results  Component Value Date   ALT 18 04/15/2015   AST 13* 04/15/2015   ALKPHOS 55 04/15/2015     Microbiology: Recent Results (from the past 240 hour(s))  Blood Culture (routine x 2)     Status: None (Preliminary result)   Collection Time: 04/11/15 11:58 PM  Result Value Ref Range Status   Specimen Description BLOOD LEFT ARM  Final   Special Requests BOTTLES DRAWN AEROBIC AND ANAEROBIC 5CC  Final   Culture NO GROWTH 2 DAYS  Final   Report Status PENDING  Incomplete  Blood Culture (routine x 2)     Status: None (Preliminary result)   Collection Time: 04/12/15 12:03 AM  Result Value Ref Range Status   Specimen Description BLOOD LEFT ARM  Final   Special Requests BOTTLES DRAWN AEROBIC AND ANAEROBIC 5CC  Final   Culture NO GROWTH 2 DAYS  Final   Report Status PENDING  Incomplete  Urine culture     Status: None   Collection Time: 04/12/15  1:09 AM  Result Value Ref Range Status   Specimen Description URINE, CLEAN CATCH  Final   Special Requests NONE  Final   Culture  MULTIPLE SPECIES PRESENT, SUGGEST RECOLLECTION  Final   Report Status 04/13/2015 FINAL  Final  C difficile quick scan w PCR reflex     Status: None   Collection Time: 04/12/15  7:17 AM  Result Value Ref Range Status   C Diff antigen NEGATIVE NEGATIVE Final   C Diff toxin NEGATIVE NEGATIVE Final   C Diff interpretation Negative for toxigenic C. difficile  Final  Gastrointestinal Panel by PCR , Stool     Status: None   Collection Time: 04/12/15  7:17 AM  Result Value Ref Range Status   Campylobacter species NOT DETECTED NOT DETECTED Final   Plesimonas shigelloides NOT DETECTED NOT DETECTED Final   Salmonella species NOT DETECTED NOT DETECTED Final   Yersinia enterocolitica NOT DETECTED NOT DETECTED Final   Vibrio species NOT DETECTED NOT DETECTED Final   Vibrio cholerae NOT DETECTED NOT DETECTED Final   Enteroaggregative E coli (EAEC) NOT DETECTED NOT DETECTED Final   Enteropathogenic E coli (EPEC) NOT DETECTED NOT DETECTED Final   Enterotoxigenic E coli (ETEC) NOT DETECTED NOT DETECTED Final   Shiga like toxin producing E coli (STEC) NOT DETECTED NOT DETECTED Final   E. coli O157 NOT DETECTED NOT DETECTED Final   Shigella/Enteroinvasive E coli (EIEC) NOT DETECTED NOT DETECTED Final  Cryptosporidium NOT DETECTED NOT DETECTED Final   Cyclospora cayetanensis NOT DETECTED NOT DETECTED Final   Entamoeba histolytica NOT DETECTED NOT DETECTED Final   Giardia lamblia NOT DETECTED NOT DETECTED Final   Adenovirus F40/41 NOT DETECTED NOT DETECTED Final   Astrovirus NOT DETECTED NOT DETECTED Final   Norovirus GI/GII NOT DETECTED NOT DETECTED Final   Rotavirus A NOT DETECTED NOT DETECTED Final   Sapovirus (I, II, IV, and V) NOT DETECTED NOT DETECTED Final  MRSA PCR Screening     Status: None   Collection Time: 04/12/15  2:33 PM  Result Value Ref Range Status   MRSA by PCR NEGATIVE NEGATIVE Final    Comment:        The GeneXpert MRSA Assay (FDA approved for NASAL specimens only), is one  component of a comprehensive MRSA colonization surveillance program. It is not intended to diagnose MRSA infection nor to guide or monitor treatment for MRSA infections.   Culture, routine-abscess     Status: None (Preliminary result)   Collection Time: 04/14/15  1:31 PM  Result Value Ref Range Status   Specimen Description ABSCESS PERITONEAL CAVITY  Final   Special Requests LEFT PSOAS AND SUBDIAPHRAGMATIC ABSCESS  Final   Gram Stain   Final    ABUNDANT WBC PRESENT, PREDOMINANTLY PMN NO SQUAMOUS EPITHELIAL CELLS SEEN FEW GRAM POSITIVE COCCI IN PAIRS Performed at [facility]    Culture PENDING  Incomplete   Report Status PENDING  Incomplete    Impression/Plan:  1. Intra abdominal abscess - no growth yet but noted gram stain.  Will continue with broad spectrum antibiotics.  Mostly likely will consider IV antibiotics at discharge.   2. luekocytosis - wnl yesterday.  related to #1.

## 2015-04-15 NOTE — Consult Note (Signed)
[facility] Face-to-Face Psychiatry Consult follow-up  Reason for Consult:  Depression Referring Physician:  Dr. Titus Mould Patient Identification: William Fischer MRN:  [id number] Principal Diagnosis: Other depression due to general medical condition Diagnosis:   Patient Active Problem List   Diagnosis Date Noted  . 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 Abdominal abscess ([place]) [K65.1]   . Hyperkalemia [E87.5]   . Fever [R50.9]   . Intra-abdominal infection [B99.9]   . Pain in the abdomen [R10.9]   . Abdominal pain [R10.9] 04/12/2015  . Septic shock ([place]) [A41.9, R65.21] 04/12/2015  . 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 Abscess of abdominal cavity ([place]) [K65.1] 02/22/2015  . Acute venous embolism and thrombosis of deep vessels of proximal lower extremity ([place]) [I82.4Y9] [I82.4Y9] 02/11/2015  . Long term (current) use of anticoagulants [Z79.01] [Z79.01] 02/11/2015  . Monitoring for long-term anticoagulant use [Z79.01] 02/11/2015  . Intra-abdominal abscess ([place]) [K65.1]   . CHF (congestive heart failure) ([place]) [I50.9]   . Tachypnea [R06.82]   . Bowel perforation ([place]) [K63.1]   . Pressure ulcer [L89.90] 01/26/2015  . Pulmonary edema [J81.1]   . Dyspnea [R06.00]   . Pleural effusion [J90]   . Perforated bowel ([place]) [K63.1]   . Respiratory failure ([place]) [J96.90]   . Atrial fibrillation with rapid ventricular response ([place]) [I48.91]   . Systolic dysfunction with acute on chronic heart failure ([place]) [I50.23]   . Hypoxia [R09.02]   . Diverticulitis of colon with perforation [K57.20] 01/14/2015  . AKI (acute kidney injury) ([place]) [N17.9]   . Atrial fibrillation ([place]) [I48.91] 12/08/2011  . Chronic systolic heart failure ([place]) [I50.22] 12/08/2011  . Acute systolic heart failure ([place]) [I50.21] 12/03/2011  . Morbid obesity ([place]) [E66.01] 12/03/2011  . Depression with anxiety [F41.8] 06/29/2011  . Obesity [E66.9] 06/29/2011    Total Time spent with  patient: 30 minutes  Subjective:   William Fischer is a 50 y.o. male patient admitted with abdominal pain with bloating and depression.  HPI:  William Fischer is a 50 years old divorced male admitted to [facility] with multiple gastrointestinal symptoms and abdominal pain. Patient is seen face-to-face for psychiatric consultation and evaluation of depression and reviewed available medical records and also case discussed with Dr. Titus Mould. Patient reported feeling depressed, sad, emotional, irritable and finds himself argumentative with the staff in the hospital and reportedly taking his status on staff. Patient reportedly worried about his 48 years old son who has been acting out in school, suspended and currently relocated to [place] from public schooling. Patient feels regrets about not being there for his son when he needs him. Reportedly he did not do well with his mother and stepfather and has been more to his home 6 months ago. Reportedly he has been skipping school, not doing well in school and has multiple behavioral problems and academic difficulties. Patient has been worried about his medical problems including abdominal abscess, septic shocks and multiple gastrointestinal symptoms and recurrence of problems and repeated multiple acute medical hospitalizations. Patient denies symptoms of mania, psychosis, suicidal/homicidal ideation, intention or plans. Patient believes his son needed psychiatric services when he is able to complete his treatment and stays stable emotionally. Patient stated he is religious and want to get a constant services from the church. Past Psychiatric History: Patient has no previous history of psychiatric problems or inpatient hospitalizations.  Interval history: Patient seen for psychiatric consultation follow-up today. Patient stated he has been compliant with medication and  has no reported adverse affects. Patient spoke with his family members including  mother and sister from [place] and stated they also takes medication Lexapro which helped in the family. Patient believes medication Lexapro is going to help him to manage his emotions and depression. Patient also expressed satisfaction and feeling better since his mother and 33 years old son came to hospital to visit him. He found out is 74 years old son has been doing fine behaviorally and academically. Patient has no safety concerns and no evidence of psychosis. Patient continue to think he needs to contact medication management and therapeutic services to 48 years old so that he won't be failing in his academics and job situation in near future.  Risk to Self: Is patient at risk for suicide?: No Risk to Others:   Prior Inpatient Therapy:   Prior Outpatient Therapy:    Past Medical History:  Past Medical History  Diagnosis Date  . Chicken pox as a child  . Migraine 06/29/2011  . Obesity 06/29/2011  . Depression with anxiety 06/29/2011  . Atrial fibrillation ([place])     admx 11/13 with acute sCHF in setting of RVR  => a. failed DCCV x 2; b. Pradaxa started;  c. failed sotalol  . Chronic systolic heart failure ([place])     a. echo 11/13: Ef 40-45%, diff HK, mod MR, mod LAE, mild RVE, mod RAE, small effusion;   b. TEE 11/13:  EF 35-40%, no LAA clot; Echo 2/14 shows normal EF  . 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 Snoring     patient needs sleep study - has declined  . Allergy to IVP dye   . Chronic anticoagulation     Pradaxa    Past Surgical History  Procedure Laterality Date  . Tee without cardioversion  11/30/2011    Procedure: TRANSESOPHAGEAL ECHOCARDIOGRAM (TEE);  Surgeon: Thayer Headings, MD;  Location: [facility];  Service: Cardiovascular;  Laterality: N/A;  . Cardioversion  11/30/2011    Procedure: CARDIOVERSION;  Surgeon: Thayer Headings, MD;  Location: [place];  Service: Cardiovascular;  Laterality: N/A;  . Cardioversion  12/03/2011     Procedure: CARDIOVERSION;  Surgeon: Jolaine Artist, MD;  Location: [facility];  Service: Cardiovascular;  Laterality: N/A;  . Laparotomy N/A 01/17/2015    Procedure: EXPLORATORY LAPAROTOMY WITH LEFT COLECTOMY AND COLOSTOMY;  Surgeon: Rolm Bookbinder, MD;  Location: [facility];  Service: General;  Laterality: N/A;   Family History:  Family History  Problem Relation Age of Onset  . Hypertension Mother   . Hypertension Father   . Aneurysm Father   . Alcohol abuse Father   . Cancer Maternal Grandmother     brain  . Diabetes Maternal Grandfather     type 2  . Parkinsonism Maternal Grandfather   . Cancer Paternal Grandmother     breast  . Diabetes Paternal Grandmother   . Alcohol abuse Paternal Grandfather    Family Psychiatric  History: Patient is single and divorced. Patient has 38 sons, 22 Years old well without emotional or behavioral problems and 48 years old has been suffering with chronic behavioral and emotional problems and currently no medication management. Social History:  History  Alcohol Use No     History  Drug Use No    Social History   Social History  . Marital Status: Divorced    Spouse Name: N/A  . Number of Children: N/A  . Years  of Education: N/A   Social History Main Topics  . Smoking status: Former Smoker -- 1.00 packs/day for 20 years    Types: Cigarettes    Quit date: 07/10/2014  . Smokeless tobacco: Never Used  . Alcohol Use: No  . Drug Use: No  . Sexual Activity: No   Other Topics Concern  . None   Social History Narrative   Additional Social History:    Allergies:   Allergies  Allergen Reactions  . Contrast Media [Iodinated Diagnostic Agents] Rash    a diffuse macular rash after CTA chest  Pt was premedicated with 148m IV Solumedrol, 561mIV Benadryl 1 hr prior to CTexam, and tolerated procedure without any difficulties. 11/28/11 Also same pre med protocol observed on 02/22/15 and pt tolerated the procedure well    Labs:  Results for  orders placed or performed during the hospital encounter of 04/12/15 (from the past 48 hour(s))  Protime-INR     Status: Abnormal   Collection Time: 04/13/15 10:40 AM  Result Value Ref Range   Prothrombin Time 43.9 (H) 11.6 - 15.2 seconds   INR 4.84 (H) 0.00 - 1.49  Glucose, capillary     Status: Abnormal   Collection Time: 04/13/15 11:21 AM  Result Value Ref Range   Glucose-Capillary 107 (H) 65 - 99 mg/dL  Prepare fresh frozen plasma     Status: None   Collection Time: 04/13/15 12:26 PM  Result Value Ref Range   Unit Number [id number]  Blood Component Type THWPLS APHR1    Unit division 00    Status of Unit ISSUED,FINAL    Transfusion Status OK TO TRANSFUSE    Unit Number [id number]  Blood Component Type THW PLS APHR    Unit division 00    Status of Unit ISSUED,FINAL    Transfusion Status OK TO TRANSFUSE    Unit Number [id number]  Blood Component Type THAWED PLASMA    Unit division 00    Status of Unit ISSUED,FINAL    Transfusion Status OK TO TRANSFUSE    Unit Number [id number]  Blood Component Type THWPLS APHR2    Unit division 00    Status of Unit ISSUED,FINAL    Transfusion Status OK TO TRANSFUSE   Digoxin level     Status: Abnormal   Collection Time: 04/13/15 12:35 PM  Result Value Ref Range   Digoxin Level <0.2 (L) 0.8 - 2.0 ng/mL    Comment: RESULTS CONFIRMED BY MANUAL DILUTION  Glucose, capillary     Status: Abnormal   Collection Time: 04/13/15  4:00 PM  Result Value Ref Range   Glucose-Capillary 118 (H) 65 - 99 mg/dL  Protime-INR     Status: Abnormal   Collection Time: 04/13/15  5:30 PM  Result Value Ref Range   Prothrombin Time 27.7 (H) 11.6 - 15.2 seconds   INR 2.62 (H) 0.00 - 1.49  APTT     Status: Abnormal   Collection Time: 04/13/15  5:30 PM  Result Value Ref Range   aPTT 49 (H) 24 - 37 seconds    Comment:        IF BASELINE aPTT IS ELEVATED, SUGGEST PATIENT RISK ASSESSMENT BE USED TO DETERMINE APPROPRIATE ANTICOAGULANT  THERAPY.   Glucose, capillary     Status: Abnormal   Collection Time: 04/13/15  8:02 PM  Result Value Ref Range   Glucose-Capillary 142 (H) 65 - 99 mg/dL  Glucose, capillary     Status: Abnormal  Collection Time: 04/13/15 11:50 PM  Result Value Ref Range   Glucose-Capillary 141 (H) 65 - 99 mg/dL  Glucose, capillary     Status: Abnormal   Collection Time: 04/14/15  3:12 AM  Result Value Ref Range   Glucose-Capillary 117 (H) 65 - 99 mg/dL  Comprehensive metabolic panel     Status: Abnormal   Collection Time: 04/14/15  5:04 AM  Result Value Ref Range   Sodium 142 135 - 145 mmol/L   Potassium 4.2 3.5 - 5.1 mmol/L   Chloride 106 101 - 111 mmol/L   CO2 25 22 - 32 mmol/L   Glucose, Bld 137 (H) 65 - 99 mg/dL   BUN 25 (H) 6 - 20 mg/dL   Creatinine, Ser 1.96 (H) 0.61 - 1.24 mg/dL   Calcium 9.6 8.9 - 10.3 mg/dL   Total Protein 7.1 6.5 - 8.1 g/dL   Albumin 2.5 (L) 3.5 - 5.0 g/dL   AST 13 (L) 15 - 41 U/L   ALT 23 17 - 63 U/L   Alkaline Phosphatase 59 38 - 126 U/L   Total Bilirubin 0.6 0.3 - 1.2 mg/dL   GFR calc non Af Amer 39 (L) >60 mL/min   GFR calc Af Amer 45 (L) >60 mL/min    Comment: (NOTE) The eGFR has been calculated using the CKD EPI equation. This calculation has not been validated in all clinical situations. eGFR's persistently <60 mL/min signify possible Chronic Kidney Disease.    Anion gap 11 5 - 15  CBC with Differential/Platelet     Status: Abnormal   Collection Time: 04/14/15  5:04 AM  Result Value Ref Range   WBC 10.4 4.0 - 10.5 K/uL   RBC 3.12 (L) 4.22 - 5.81 MIL/uL   Hemoglobin 8.9 (L) 13.0 - 17.0 g/dL   HCT 29.8 (L) 39.0 - 52.0 %   MCV 95.5 78.0 - 100.0 fL   MCH 28.5 26.0 - 34.0 pg   MCHC 29.9 (L) 30.0 - 36.0 g/dL   RDW 16.8 (H) 11.5 - 15.5 %   Platelets 546 (H) 150 - 400 K/uL   Neutrophils Relative % 85 %   Neutro Abs 8.9 (H) 1.7 - 7.7 K/uL   Lymphocytes Relative 10 %   Lymphs Abs 1.0 0.7 - 4.0 K/uL   Monocytes Relative 5 %   Monocytes Absolute 0.5 0.1  - 1.0 K/uL   Eosinophils Relative 0 %   Eosinophils Absolute 0.0 0.0 - 0.7 K/uL   Basophils Relative 0 %   Basophils Absolute 0.0 0.0 - 0.1 K/uL  Phosphorus     Status: None   Collection Time: 04/14/15  5:04 AM  Result Value Ref Range   Phosphorus 3.5 2.5 - 4.6 mg/dL  Glucose, capillary     Status: Abnormal   Collection Time: 04/14/15  7:53 AM  Result Value Ref Range   Glucose-Capillary 131 (H) 65 - 99 mg/dL  Protime-INR     Status: Abnormal   Collection Time: 04/14/15  8:07 AM  Result Value Ref Range   Prothrombin Time 18.8 (H) 11.6 - 15.2 seconds   INR 1.57 (H) 0.00 - 1.49  Culture, routine-abscess     Status: None (Preliminary result)   Collection Time: 04/14/15  1:31 PM  Result Value Ref Range   Specimen Description ABSCESS PERITONEAL CAVITY    Special Requests LEFT PSOAS AND SUBDIAPHRAGMATIC ABSCESS    Gram Stain      ABUNDANT WBC PRESENT, PREDOMINANTLY PMN NO SQUAMOUS EPITHELIAL CELLS SEEN FEW GRAM  POSITIVE COCCI IN PAIRS Performed at [facility]    Culture PENDING    Report Status PENDING   Glucose, capillary     Status: Abnormal   Collection Time: 04/14/15  2:07 PM  Result Value Ref Range   Glucose-Capillary 106 (H) 65 - 99 mg/dL  Glucose, capillary     Status: None   Collection Time: 04/14/15  3:38 PM  Result Value Ref Range   Glucose-Capillary 90 65 - 99 mg/dL  Glucose, capillary     Status: Abnormal   Collection Time: 04/14/15  7:58 PM  Result Value Ref Range   Glucose-Capillary 180 (H) 65 - 99 mg/dL  Glucose, capillary     Status: Abnormal   Collection Time: 04/14/15 11:50 PM  Result Value Ref Range   Glucose-Capillary 133 (H) 65 - 99 mg/dL  Glucose, capillary     Status: Abnormal   Collection Time: 04/15/15  3:09 AM  Result Value Ref Range   Glucose-Capillary 183 (H) 65 - 99 mg/dL  Protime-INR     Status: Abnormal   Collection Time: 04/15/15  6:05 AM  Result Value Ref Range   Prothrombin Time 18.5 (H) 11.6 - 15.2 seconds   INR 1.53 (H)  0.00 - 1.49  Comprehensive metabolic panel     Status: Abnormal   Collection Time: 04/15/15  6:05 AM  Result Value Ref Range   Sodium 140 135 - 145 mmol/L   Potassium 3.7 3.5 - 5.1 mmol/L   Chloride 104 101 - 111 mmol/L   CO2 28 22 - 32 mmol/L   Glucose, Bld 137 (H) 65 - 99 mg/dL   BUN 21 (H) 6 - 20 mg/dL   Creatinine, Ser 1.45 (H) 0.61 - 1.24 mg/dL   Calcium 9.3 8.9 - 10.3 mg/dL   Total Protein 6.6 6.5 - 8.1 g/dL   Albumin 2.3 (L) 3.5 - 5.0 g/dL   AST 13 (L) 15 - 41 U/L   ALT 18 17 - 63 U/L   Alkaline Phosphatase 55 38 - 126 U/L   Total Bilirubin 0.4 0.3 - 1.2 mg/dL   GFR calc non Af Amer 56 (L) >60 mL/min   GFR calc Af Amer >60 >60 mL/min    Comment: (NOTE) The eGFR has been calculated using the CKD EPI equation. This calculation has not been validated in all clinical situations. eGFR's persistently <60 mL/min signify possible Chronic Kidney Disease.    Anion gap 8 5 - 15  Glucose, capillary     Status: Abnormal   Collection Time: 04/15/15  7:30 AM  Result Value Ref Range   Glucose-Capillary 116 (H) 65 - 99 mg/dL    Current Facility-Administered Medications  Medication Dose Route Frequency Provider Last Rate Last Dose  . 0.9 %  sodium chloride infusion  250 mL Intravenous PRN Juanito Doom, MD      . 0.9 %  sodium chloride infusion   Intravenous Continuous Raylene Miyamoto, MD 10 mL/hr at 04/14/15 1400 10 mL/hr at 04/14/15 1400  . acetaminophen (TYLENOL) tablet 650 mg  650 mg Oral Q4H PRN Juanito Doom, MD      . amiodarone (NEXTERONE PREMIX) 360 MG/200ML (1.8 mg/mL) IV infusion  30 mg/hr Intravenous Continuous Raylene Miyamoto, MD 16.7 mL/hr at 04/15/15 0131 30 mg/hr at 04/15/15 0131  . anidulafungin (ERAXIS) 100 mg in sodium chloride 0.9 % 100 mL IVPB  100 mg Intravenous Q24H Juanito Doom, MD   100 mg at 04/14/15 1430  . antiseptic  oral rinse (CPC / CETYLPYRIDINIUM CHLORIDE 0.05%) solution 7 mL  7 mL Mouth Rinse BID Juanito Doom, MD   7 mL at 04/15/15  0021  . digoxin (LANOXIN) 0.25 MG/ML injection 0.125 mg  0.125 mg Intravenous Daily Raylene Miyamoto, MD   0.125 mg at 04/14/15 1424  . diphenhydrAMINE-zinc acetate (BENADRYL) 2-0.1 % cream   Topical TID PRN Anders Simmonds, MD      . escitalopram (LEXAPRO) tablet 10 mg  10 mg Oral Daily Ambrose Finland, MD   10 mg at 04/15/15 1008  . fentaNYL (SUBLIMAZE) injection 25 mcg  25 mcg Intravenous Q2H PRN Raylene Miyamoto, MD   25 mcg at 04/15/15 1003  . hydrocortisone sodium succinate (SOLU-CORTEF) 100 MG injection 50 mg  50 mg Intravenous Q6H Raylene Miyamoto, MD   50 mg at 04/15/15 0609  . insulin aspart (novoLOG) injection 0-15 Units  0-15 Units Subcutaneous 6 times per day Juanito Doom, MD   3 Units at 04/15/15 [phone]  . neomycin-bacitracin-polymyxin (NEOSPORIN) ointment   Topical Daily Judeth Horn, MD   1 application at 65/68/12 1032  . norepinephrine (LEVOPHED) 4 mg in dextrose 5 % 250 mL (0.016 mg/mL) infusion  2-50 mcg/min Intravenous Continuous Juanito Doom, MD   Stopped at 04/13/15 2200  . ondansetron (ZOFRAN) injection 4 mg  4 mg Intravenous Q6H PRN Juanito Doom, MD      . piperacillin-tazobactam (ZOSYN) IVPB 3.375 g  3.375 g Intravenous 3 times per day Franky Macho, RPH 12.5 mL/hr at 04/15/15 0606 3.375 g at 04/15/15 0606  . vancomycin (VANCOCIN) 1,250 mg in sodium chloride 0.9 % 250 mL IVPB  1,250 mg Intravenous Q24H Valeda Malm Rumbarger, RPH   1,250 mg at 04/14/15 7517  . zolpidem (AMBIEN) tablet 5 mg  5 mg Oral QHS PRN Rush Farmer, MD        Musculoskeletal: Strength & Muscle Tone: decreased Gait & Station: unable to stand Patient leans: N/A  Psychiatric Specialty Exam: ROS:  Blood pressure 128/83, pulse 88, temperature 97.7 F (36.5 C), temperature source Oral, resp. rate 22, height 5' 10"  (1.778 m), weight 113.4 kg (250 lb), SpO2 97 %.Body mass index is 35.87 kg/(m^2).  General Appearance: Casual  Eye Contact::  Good  Speech:  Clear and Coherent   Volume:  Decreased  Mood:  Anxious and Depressed  Affect:  Constricted and Depressed  Thought Process:  Coherent and Goal Directed  Orientation:  Full (Time, Place, and Person)  Thought Content:  WDL and Rumination  Suicidal Thoughts:  No  Homicidal Thoughts:  No  Memory:  Immediate;   Good Recent;   Fair Remote;   Fair  Judgement:  Intact  Insight:  Fair  Psychomotor Activity:  Decreased  Concentration:  Good  Recall:  Good  Fund of Knowledge:Good  Language: Good  Akathisia:  Negative  Handed:  Right  AIMS (if indicated):     Assets:  Communication Skills Desire for Improvement Financial Resources/Insurance Housing Leisure Time Resilience Social Support Transportation  ADL's:  Impaired  Cognition: WNL  Sleep:      Treatment Plan Summary: Safety concerns: Patient has no safety concerns and contract for safety Patient consented for exercise after brief discussion of risks and benefits Will Continue Lexapro 10 mg daily for depression and anxiety Will continue Ambien 5 mg at bedtime for insomnia We will encourage faith based / religious counseling services Appreciate psychiatric consultation and we sign off as of today Please  contact [phone] or [phone] if needs further assistance  Disposition: Patient will be referred to the outpatient medication management when medically stable Patient does not meet criteria for psychiatric inpatient admission. Supportive therapy provided about ongoing stressors.  Durward Parcel., MD 04/15/2015 10:09 AM

## 2015-04-15 NOTE — H&P (Signed)
PULMONARY / CRITICAL CARE MEDICINE   Name: GEN WOLFINGER MRN: [id number] DOB: 03-Nov-1965    ADMISSION DATE:  04/12/2015 CHIEF COMPLAINT:  Fevers abdominal pain  HISTORY OF PRESENT ILLNESS:   50 yo male with ischemic bowel disease who underwent Ex-lap with left, colectomy, with end colostomy which was complicated by intra-abdominal abscess and rectal stump leak in December of 2016.  Intra-abdominal drains were placed.  He has had multiple hospitalization from intra-abdominal complications.  He was on chronic ciprofloxacin for 3-4 months and this was stopped 4 weeks ago.  Over the last week he has noted increased LLQ abdominal pain with bloating, n/v and profuse ostomy output.  He was febrile at home to 102.  SUBJECTIVE:  Psych evaluation No pressors needed overnight  VITAL SIGNS: Temp:  [97.7 F (36.5 C)-98.5 F (36.9 C)] 97.9 F (36.6 C) (03/29 1159) Pulse Rate:  [63-103] 103 (03/29 1000) Resp:  [17-29] 26 (03/29 1000) BP: (100-131)/(57-98) 131/98 mmHg (03/29 1000) SpO2:  [94 %-99 %] 95 % (03/29 1000) Weight:  [113.4 kg (250 lb)] 113.4 kg (250 lb) (03/29 0557) HEMODYNAMICS: CVP:  [7 mmHg-13 mmHg] 13 mmHg VENTILATOR SETTINGS:   INTAKE / OUTPUT:  Intake/Output Summary (Last 24 hours) at 04/15/15 1306 Last data filed at 04/15/15 1000  Gross per 24 hour  Intake 3009.37 ml  Output   1010 ml  Net 1999.37 ml    PHYSICAL EXAMINATION: General:  Mild acute distress Neuro:  AAOx3, CN II-XII intact HEENT:  jvd wnl Cardiovascular:  s1 s2 IRR not tachy Lungs:  CTA  Abdomen:  Soft, TTP LLQ, no rebound or guarding, bs wnl, Right colostomy pink and draining, LLQ drains with purulent fluid.  Midline abdominal wound - pink, granulated clean, new drain with pus working Musculoskeletal:  Normal bulk and tone Skin:  No c/c/e  LABS:  CBC  Recent Labs Lab 04/12/15 2331 04/13/15 0509 04/14/15 0504  WBC 12.6* 11.4* 10.4  HGB 10.0* 9.3* 8.9*  HCT 30.9* 30.3* 29.8*  PLT 560* 561*  546*   Coag's  Recent Labs Lab 04/13/15 1730 04/14/15 0807 04/15/15 0605  APTT 49*  --   --   INR 2.62* 1.57* 1.53*   BMET  Recent Labs Lab 04/13/15 0509 04/14/15 0504 04/15/15 0605  NA 142 142 140  K 4.4 4.2 3.7  CL 104 106 104  CO2 28 25 28   BUN 26* 25* 21*  CREATININE 2.10* 1.96* 1.45*  GLUCOSE 111* 137* 137*   Electrolytes  Recent Labs Lab 04/12/15 1430  04/13/15 0509 04/14/15 0504 04/15/15 0605  CALCIUM 9.3  < > 9.2 9.6 9.3  MG 2.0  --   --   --   --   PHOS 5.1*  --   --  3.5  --   < > = values in this interval not displayed. Sepsis Markers  Recent Labs Lab 04/12/15 0238 04/12/15 0414 04/12/15 1430  LATICACIDVEN 1.79 1.54 0.8  PROCALCITON  --   --  0.55   ABG No results for input(s): PHART, PCO2ART, PO2ART in the last 168 hours. Liver Enzymes  Recent Labs Lab 04/12/15 1430 04/14/15 0504 04/15/15 0605  AST 14* 13* 13*  ALT 31 23 18   ALKPHOS 68 59 55  BILITOT 0.3 0.6 0.4  ALBUMIN 2.3* 2.5* 2.3*   Cardiac Enzymes No results for input(s): TROPONINI, PROBNP in the last 168 hours. Glucose  Recent Labs Lab 04/14/15 1538 04/14/15 1958 04/14/15 2350 04/15/15 0309 04/15/15 0730 04/15/15 1158  GLUCAP 90 180*  133* 183* 116* 157*    Imaging Ct Image Guided Fluid Drain By Catheter  04/15/2015  INDICATION: 50 year old male with a history of ischemic colitis status post bowel resection and colostomy complicated by multiple intra-abdominal abscesses. He has undergone placement of multiple percutaneous drainage catheters previously. Most recent CT imaging which was performed for persistent abdominal pain and intermittent low-grade fevers demonstrates a residual un treated subdiaphragmatic abscess. This abscess appears to communicate via a thin channel with residual fluid tracking along the anterior aspect of the left psoas muscle. There was an abscess in this location on prior imaging. He presents today for drainage of these left retroperitoneal  psoas and subdiaphragmatic collections. EXAM: CT IMAGE GUIDED FLUID DRAIN BY CATHETER MEDICATIONS: The patient is currently admitted to the hospital and receiving intravenous antibiotics. The antibiotics were administered within an appropriate time frame prior to the initiation of the procedure. ANESTHESIA/SEDATION: Fentanyl 150 mcg IV; Versed Three mg IV Moderate Sedation Time:  40 The patient was continuously monitored during the procedure by the interventional radiology nurse under my direct supervision. COMPLICATIONS: None immediate. PROCEDURE: Informed written consent was obtained from the patient after a thorough discussion of the procedural risks, benefits and alternatives. All questions were addressed. Maximal Sterile Barrier Technique was utilized including caps, mask, sterile gowns, sterile gloves, sterile drape, hand hygiene and skin antiseptic. A timeout was performed prior to the initiation of the procedure. A planning axial CT scan was performed. The fluid collection anterior to the left psoas muscle was successfully identified. A suitable skin entry site was selected and marked. The region was sterilely prepped and draped in standard fashion using chlorhexidine skin prep. Local anesthesia was attained by infiltration with 1% lidocaine. Using intermittent CT imaging, a 15 cm 18 gauge trocar needle was carefully advanced through the left lower quadrant abdominal wall and into the psoas fluid collection. A wire was then carefully manipulated in until it was successfully passed cephalad into the subdiaphragmatic collection. The skin tract was then serially dilated to 14 [place]. A Cook 66 [place] biliary drainage catheter was then advanced over the wire. Aspiration yields approximately 200 mL foul-smelling purulent fluid. Axial CT imaging demonstrates excellent position of the drainage catheter and near complete collapse of the residual abscess cavity. The catheter was connected to JP bulb suction and  secured to the skin with 0 Prolene suture. IMPRESSION: Successful placement of a 86 French biliary drainage catheter so that it extends from the left psoas abscess cephalad into the left subdiaphragmatic abscess. Aspiration yields 200 mL foul-smelling purulent fluid. A sample was sent for culture. Signed, Criselda Peaches, MD [facility] Electronically Signed   By: Jacqulynn Cadet M.D.   On: 04/15/2015 09:54     ASSESSMENT / PLAN: 50 yo male with persistent hypotension and AKI likely hypovolemic, pre-renal azotemia and septic shock.  PULMONARY A: at risk atx, int prominence  Even balance goals to neg IS Was pos gain, may need lasix  CARDIOVASCULAR A:  Atrial fibrilation - rate NOT controlled Septic Shock Rel AI noted P:  -tele dc -may need lasix -even balance goals -dig to PO -dc amio drip and observe rate -reduce roids q12h as no pressors x 36 hrs  RENAL A:   AKI likley ATN, sepsis P:   -even goals -kvo May need lasix Chem in am   GASTROINTESTINAL A:   Chronic intra-abdominal abscess Increased ostomy output P:   - surgery following -reg diet - i called sister  yesterday , she wanted input from surgery on how long drains will stay in   HEMATOLOGIC A:   Leukocytosis coagulapthy (sepsis) - resolved fib P:  -as we reversed coumadin, would prefer hep bridge to coumadin - pharmacy will do  -cbc in am   INFECTIOUS A:  Septic shock with AKI - source intraabdominal  P:   BCx2 04/12/2015 UC 04/12/2015 Sputum N/A Drain 3/28>>> Abx:  3/25 Vanc>>>3/28 3/25 zosyn>>> 3/2 5 eraxis>>>  follow drain culture, if neg in am , consider narrow As he was on cirpo oupt, will consult them for duration and prevention as outpt?  ENDOCRINE A:   Hyperglycemia P:   SSI BG goal <180  NEUROLOGIC A:    Monitor pain post drain  PSYCH: A: Major depressive episode - surrounding circumstances of chronic  abdominal issues  P: -psych to refer to outpt - SSRI   Lavon Paganini. Titus Mould, [place] Pgr: [facility]    To [facility], med

## 2015-04-15 NOTE — Progress Notes (Signed)
ANTICOAGULATION CONSULT NOTE - Initial Consult  Pharmacy Consult for warfarin, heparin Indication: atrial fibrillation  Allergies  Allergen Reactions  . Contrast Media [Iodinated Diagnostic Agents] Rash    a diffuse macular rash after CTA chest  Pt was premedicated with 125mg  IV Solumedrol, 50mg  IV Benadryl 1 hr prior to CTexam, and tolerated procedure without any difficulties. 11/28/11 Also same pre med protocol observed on 02/22/15 and pt tolerated the procedure well    Patient Measurements: Height: 5\' 10"  (177.8 cm) Weight: 250 lb (113.4 kg) IBW/kg (Calculated) : 73 Heparin Dosing Weight: 95kg  Vital Signs: Temp: 97.9 F (36.6 C) (03/29 1159) Temp Source: Oral (03/29 1159) BP: 133/83 mmHg (03/29 1300) Pulse Rate: 92 (03/29 1300)  Labs:  Recent Labs  04/12/15 2331 04/13/15 0509  04/13/15 1730 04/14/15 0504 04/14/15 0807 04/15/15 0605  HGB 10.0* 9.3*  --   --  8.9*  --   --   HCT 30.9* 30.3*  --   --  29.8*  --   --   PLT 560* 561*  --   --  546*  --   --   APTT  --   --   --  49*  --   --   --   LABPROT  --   --   < > 27.7*  --  18.8* 18.5*  INR  --   --   < > 2.62*  --  1.57* 1.53*  CREATININE 2.24* 2.10*  --   --  1.96*  --  1.45*  < > = values in this interval not displayed.  Estimated Creatinine Clearance: 79.5 mL/min (by C-G formula based on Cr of 1.45).   Medical History: Past Medical History  Diagnosis Date  . Chicken pox as a child  . Migraine 06/29/2011  . Obesity 06/29/2011  . Depression with anxiety 06/29/2011  . Atrial fibrillation ([place])     admx 11/13 with acute sCHF in setting of RVR  => a. failed DCCV x 2; b. Pradaxa started;  c. failed sotalol  . Chronic systolic heart failure ([place])     a. echo 11/13: Ef 40-45%, diff HK, mod MR, mod LAE, mild RVE, mod RAE, small effusion;   b. TEE 11/13:  EF 35-40%, no LAA clot; Echo 2/14 shows normal EF  . 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 Snoring     patient needs  sleep study - has declined  . Allergy to IVP dye   . Chronic anticoagulation     Pradaxa    Medications:  Infusions:  . sodium chloride 10 mL/hr (04/14/15 1400)  . heparin    . norepinephrine (LEVOPHED) Adult infusion Stopped (04/13/15 2200)    Assessment: 41 yom on chronic coumadin for afib is now s/p drain placement. Warfarin had been held and reversed with vitamin K for the procedure but now planning to restart anticoagulation with heparin and warfarin. INR is 1.53 today, H/H is slightly low as of yesterday and platelets are elevated. No bleeding noted.   Goal of Therapy:  INR 2-3 Heparin level 0.3-0.7 units/ml Monitor platelets by anticoagulation protocol: Yes   Plan:  - Heparin gtt 1600 units/hr - Check an 8 hour heparin level - Warfarin 7.5mg  PO x 1 tonight - Daily heparin level, INR and CBC  Jinx Gilden, Rande Lawman 04/15/2015,2:17 PM

## 2015-04-15 NOTE — Progress Notes (Signed)
Pharmacy Antibiotic Note  William Fischer is a 50 year old male s/p exploratory laparotomy, left colectomy, end colostomy for ischemic bowel on [id number] complicated by post op intra-abdominal abscesses and rectal stump leak withprolonged hospitalization. Pt continues with perc drains. Pt reports that one drain continues to fill up with pus. Pharmacy has been consulted for Zosyn and Vancomycin dosing. Renal function continues to improve. Pt is afebrile and WBC is WNL as of 3/28  Plan: - Change vancomycin to 750mg  IV Q12H - Continue zosyn 3.375gm IV Q8H (4 hr inf)  - F/u renal fxn, C&S, clinical status and trough at SS  Height: 5\' 10"  (177.8 cm) Weight: 250 lb (113.4 kg) IBW/kg (Calculated) : 73  Temp (24hrs), Avg:98.1 F (36.7 C), Min:97.7 F (36.5 C), Max:98.5 F (36.9 C)   Recent Labs Lab 04/11/15 2358 04/12/15 0033  04/12/15 0238  04/12/15 0414 04/12/15 1430 04/12/15 2331 04/13/15 0509 04/14/15 0504 04/15/15 0605  WBC 21.9*  --   --   --   --   --  15.7* 12.6* 11.4* 10.4  --   CREATININE 4.09*  --   < >  --   < >  --  2.78* 2.24* 2.10* 1.96* 1.45*  LATICACIDVEN  --  1.82  --  1.79  --  1.54 0.8  --   --   --   --   < > = values in this interval not displayed.  Estimated Creatinine Clearance: 79.5 mL/min (by C-G formula based on Cr of 1.45).    Allergies  Allergen Reactions  . Contrast Media [Iodinated Diagnostic Agents] Rash    a diffuse macular rash after CTA chest  Pt was premedicated with 125mg  IV Solumedrol, 50mg  IV Benadryl 1 hr prior to CTexam, and tolerated procedure without any difficulties. 11/28/11 Also same pre med protocol observed on 02/22/15 and pt tolerated the procedure well    Antimicrobials this admission: 3/26 Zosyn >>  3/26 Vanc >> 3/26 Eraxis >>  Microbiology results: 3/28 Abscess>> 3/28 Blood - NGTD 3/25 BCx: NGTD 3/26 UCx: multiple spec 3/26 Cdiff - NEG 3/26 GI panel - NEG 3/26 MRSA - NEG 1/12 Abd abscess - Ecoli (R only to amp &  Unasyn)  Thank you for allowing pharmacy to be a part of this patient's care.  Mister Krahenbuhl, Rande Lawman, PharmD 04/15/2015, 10:34 AM

## 2015-04-15 NOTE — Progress Notes (Signed)
[facility] Progress Note    Subjective: Patient complaining of lower abdominal discomfort, inability to urinate and back pain bilaterally.  Voided well yesterday.  IR drain yesterday pulled out 200cc of purulent fluid.  Objective: Vital signs in last 24 hours: Temp:  [97.7 F (36.5 C)-98.5 F (36.9 C)] 97.7 F (36.5 C) (03/29 0310) Pulse Rate:  [62-100] 83 (03/29 0600) Resp:  [17-29] 22 (03/29 0600) BP: (100-125)/(57-88) 113/84 mmHg (03/29 0600) SpO2:  [94 %-99 %] 97 % (03/29 0600) Weight:  [113.4 kg (250 lb)] 113.4 kg (250 lb) (03/29 0557) Last BM Date: 04/15/15  Intake/Output from previous day: 03/28 0701 - 03/29 0700 In: 3302.8 [P.O.:1800; I.V.:952.8; IV Piggyback:550] Out: 880 [Urine:300; Drains:130; Stool:450] Intake/Output this shift:    General: Back apin and lower abdominal pain.  Tolerating full liquid diet.  Lungs: Clear  Abd: Soft, good bowel sounds.  Minimally tender  Extremities: No changes.  Neuro: Intact  Lab Results:  @LABLAST2 (wbc:2,hgb:2,hct:2,plt:2) BMET ) Recent Labs  04/14/15 0504 04/15/15 0605  NA 142 140  K 4.2 3.7  CL 106 104  CO2 25 28  GLUCOSE 137* 137*  BUN 25* 21*  CREATININE 1.96* 1.45*  CALCIUM 9.6 9.3   PT/INR  Recent Labs  04/14/15 0807 04/15/15 0605  LABPROT 18.8* 18.5*  INR 1.57* 1.53*   ABG No results for input(s): PHART, HCO3 in the last 72 hours.  Invalid input(s): PCO2, PO2  Studies/Results: No results found.  Anti-infectives: Anti-infectives    Start     Dose/Rate Route Frequency Ordered Stop   04/13/15 1800  vancomycin (VANCOCIN) 1,250 mg in sodium chloride 0.9 % 250 mL IVPB     1,250 mg 166.7 mL/hr over 90 Minutes Intravenous Every 24 hours 04/13/15 0722     04/13/15 1500  anidulafungin (ERAXIS) 100 mg in sodium chloride 0.9 % 100 mL IVPB     100 mg over 90 Minutes Intravenous Every 24 hours 04/12/15 1434     04/12/15 1700  vancomycin (VANCOCIN) 1,500 mg in sodium chloride 0.9 % 500 mL IVPB  Status:   Discontinued     1,500 mg 250 mL/hr over 120 Minutes Intravenous Every 48 hours 04/12/15 1528 04/13/15 0722   04/12/15 1500  anidulafungin (ERAXIS) 200 mg in sodium chloride 0.9 % 200 mL IVPB     200 mg over 180 Minutes Intravenous  Once 04/12/15 1434 04/12/15 1801   04/12/15 1430  anidulafungin (ERAXIS) 100 mg in sodium chloride 0.9 % 100 mL IVPB  Status:  Discontinued     100 mg over 90 Minutes Intravenous Every 24 hours 04/12/15 1418 04/12/15 1434   04/12/15 1000  piperacillin-tazobactam (ZOSYN) IVPB 3.375 g     3.375 g 12.5 mL/hr over 240 Minutes Intravenous 3 times per day 04/12/15 0545     04/12/15 0345  vancomycin (VANCOCIN) IVPB 1000 mg/200 mL premix     1,000 mg 200 mL/hr over 60 Minutes Intravenous  Once 04/12/15 0334 04/12/15 0843   04/12/15 0130  piperacillin-tazobactam (ZOSYN) IVPB 3.375 g     3.375 g 100 mL/hr over 30 Minutes Intravenous  Once 04/12/15 0115 04/12/15 0215      Assessment/Plan: s/p  Advance diet Restart coumadin probably tomorrow.  LOS: 3 days   Kathryne Eriksson. Dahlia Bailiff, MD, FACS [phone] [facility] Surgery 04/15/2015

## 2015-04-15 NOTE — Progress Notes (Signed)
Pt refused chest x-ray this am

## 2015-04-16 ENCOUNTER — Inpatient Hospital Stay (HOSPITAL_COMMUNITY): Payer: Medicaid Other

## 2015-04-16 DIAGNOSIS — R6521 Severe sepsis with septic shock: Secondary | ICD-10-CM

## 2015-04-16 DIAGNOSIS — Z9049 Acquired absence of other specified parts of digestive tract: Secondary | ICD-10-CM

## 2015-04-16 DIAGNOSIS — F418 Other specified anxiety disorders: Secondary | ICD-10-CM

## 2015-04-16 DIAGNOSIS — I5022 Chronic systolic (congestive) heart failure: Secondary | ICD-10-CM

## 2015-04-16 DIAGNOSIS — I482 Chronic atrial fibrillation: Secondary | ICD-10-CM

## 2015-04-16 DIAGNOSIS — R7303 Prediabetes: Secondary | ICD-10-CM

## 2015-04-16 DIAGNOSIS — Z933 Colostomy status: Secondary | ICD-10-CM

## 2015-04-16 DIAGNOSIS — N179 Acute kidney failure, unspecified: Secondary | ICD-10-CM

## 2015-04-16 DIAGNOSIS — A419 Sepsis, unspecified organism: Secondary | ICD-10-CM

## 2015-04-16 DIAGNOSIS — D649 Anemia, unspecified: Secondary | ICD-10-CM

## 2015-04-16 DIAGNOSIS — Z7901 Long term (current) use of anticoagulants: Secondary | ICD-10-CM

## 2015-04-16 LAB — GLUCOSE, CAPILLARY
GLUCOSE-CAPILLARY: 106 mg/dL — AB (ref 65–99)
GLUCOSE-CAPILLARY: 141 mg/dL — AB (ref 65–99)
GLUCOSE-CAPILLARY: 88 mg/dL (ref 65–99)
Glucose-Capillary: 126 mg/dL — ABNORMAL HIGH (ref 65–99)
Glucose-Capillary: 109 mg/dL — ABNORMAL HIGH (ref 65–99)
Glucose-Capillary: 85 mg/dL (ref 65–99)

## 2015-04-16 LAB — BASIC METABOLIC PANEL
Anion gap: 7 (ref 5–15)
BUN: 15 mg/dL (ref 6–20)
CO2: 29 mmol/L (ref 22–32)
CREATININE: 1.18 mg/dL (ref 0.61–1.24)
Calcium: 9 mg/dL (ref 8.9–10.3)
Chloride: 105 mmol/L (ref 101–111)
Glucose, Bld: 119 mg/dL — ABNORMAL HIGH (ref 65–99)
POTASSIUM: 3.2 mmol/L — AB (ref 3.5–5.1)
SODIUM: 141 mmol/L (ref 135–145)

## 2015-04-16 LAB — HEPARIN LEVEL (UNFRACTIONATED)
Heparin Unfractionated: 0.26 IU/mL — ABNORMAL LOW (ref 0.30–0.70)
Heparin Unfractionated: 0.26 IU/mL — ABNORMAL LOW (ref 0.30–0.70)
Heparin Unfractionated: <0.1 IU/mL — ABNORMAL LOW (ref 0.30–0.70)

## 2015-04-16 LAB — URINE MICROSCOPIC-ADD ON: RBC / HPF: NONE SEEN RBC/hpf (ref 0–5)

## 2015-04-16 LAB — PROTIME-INR
INR: 1.91 — ABNORMAL HIGH (ref 0.00–1.49)
PROTHROMBIN TIME: 21.8 s — AB (ref 11.6–15.2)

## 2015-04-16 LAB — URINALYSIS, ROUTINE W REFLEX MICROSCOPIC
BILIRUBIN URINE: NEGATIVE
Glucose, UA: NEGATIVE mg/dL
KETONES UR: NEGATIVE mg/dL
NITRITE: NEGATIVE
PH: 6.5 (ref 5.0–8.0)
PROTEIN: NEGATIVE mg/dL
Specific Gravity, Urine: 1.021 (ref 1.005–1.030)

## 2015-04-16 LAB — CBC
HCT: 30.6 % — ABNORMAL LOW (ref 39.0–52.0)
HEMOGLOBIN: 9.7 g/dL — AB (ref 13.0–17.0)
MCH: 29.9 pg (ref 26.0–34.0)
MCHC: 31.7 g/dL (ref 30.0–36.0)
MCV: 94.4 fL (ref 78.0–100.0)
PLATELETS: 494 10*3/uL — AB (ref 150–400)
RBC: 3.24 MIL/uL — AB (ref 4.22–5.81)
RDW: 16.3 % — ABNORMAL HIGH (ref 11.5–15.5)
WBC: 12.3 10*3/uL — ABNORMAL HIGH (ref 4.0–10.5)

## 2015-04-16 MED ORDER — OXYCODONE HCL 5 MG PO TABS
5.0000 mg | ORAL_TABLET | ORAL | Status: DC | PRN
  Administered 2015-04-16 – 2015-04-19 (×7): 10 mg via ORAL
  Filled 2015-04-16 (×7): qty 2

## 2015-04-16 MED ORDER — HYDROCORTISONE NA SUCCINATE PF 100 MG IJ SOLR: 50.0000 mg | Freq: Every day | INTRAMUSCULAR | Status: DC

## 2015-04-16 MED ORDER — INSULIN ASPART 100 UNIT/ML ~~LOC~~ SOLN
0.0000 [IU] | Freq: Three times a day (TID) | SUBCUTANEOUS | Status: DC
  Administered 2015-04-17 – 2015-04-18 (×2): 2 [IU] via SUBCUTANEOUS

## 2015-04-16 MED ORDER — ENSURE ENLIVE PO LIQD
237.0000 mL | Freq: Two times a day (BID) | ORAL | Status: DC
  Administered 2015-04-16 – 2015-04-19 (×5): 237 mL via ORAL

## 2015-04-16 MED ORDER — FENTANYL CITRATE (PF) 100 MCG/2ML IJ SOLN
25.0000 ug | INTRAMUSCULAR | Status: DC | PRN
  Administered 2015-04-16 (×2): 25 ug via INTRAVENOUS
  Filled 2015-04-16 (×2): qty 2

## 2015-04-16 MED ORDER — POTASSIUM CHLORIDE CRYS ER 20 MEQ PO TBCR
40.0000 meq | EXTENDED_RELEASE_TABLET | Freq: Once | ORAL | Status: AC
  Administered 2015-04-16: 40 meq via ORAL
  Filled 2015-04-16: qty 2

## 2015-04-16 MED ORDER — WARFARIN SODIUM 5 MG PO TABS
5.0000 mg | ORAL_TABLET | Freq: Once | ORAL | Status: AC
  Administered 2015-04-16: 5 mg via ORAL
  Filled 2015-04-16: qty 1

## 2015-04-16 MED ORDER — HYDROCORTISONE NA SUCCINATE PF 100 MG IJ SOLR
50.0000 mg | Freq: Every day | INTRAMUSCULAR | Status: AC
  Administered 2015-04-16 – 2015-04-17 (×2): 50 mg via INTRAVENOUS
  Filled 2015-04-16 (×2): qty 2
  Filled 2015-04-16: qty 1

## 2015-04-16 MED ORDER — SODIUM CHLORIDE 0.9% FLUSH
10.0000 mL | Freq: Two times a day (BID) | INTRAVENOUS | Status: DC
  Administered 2015-04-16 (×2): 20 mL
  Administered 2015-04-17 – 2015-04-19 (×3): 10 mL

## 2015-04-16 MED ORDER — SODIUM CHLORIDE 0.9% FLUSH
10.0000 mL | INTRAVENOUS | Status: DC | PRN
  Administered 2015-04-18 – 2015-04-19 (×5): 10 mL
  Filled 2015-04-16 (×5): qty 40

## 2015-04-16 MED ORDER — METRONIDAZOLE 500 MG PO TABS
500.0000 mg | ORAL_TABLET | Freq: Three times a day (TID) | ORAL | Status: DC
  Administered 2015-04-16 – 2015-04-19 (×10): 500 mg via ORAL
  Filled 2015-04-16 (×13): qty 1

## 2015-04-16 MED ORDER — DEXTROSE 5 % IV SOLN
2.0000 g | INTRAVENOUS | Status: DC
  Administered 2015-04-16 – 2015-04-17 (×2): 2 g via INTRAVENOUS
  Filled 2015-04-16 (×4): qty 2

## 2015-04-16 NOTE — Progress Notes (Signed)
[place] for warfarin, heparin Indication: atrial fibrillation  Allergies  Allergen Reactions  . Contrast Media [Iodinated Diagnostic Agents] Rash    a diffuse macular rash after CTA chest  Pt was premedicated with 125mg  IV Solumedrol, 50mg  IV Benadryl 1 hr prior to CTexam, and tolerated procedure without any difficulties. 11/28/11 Also same pre med protocol observed on 02/22/15 and pt tolerated the procedure well    Patient Measurements: Height: 5\' 10"  (177.8 cm) Weight: 255 lb 1.2 oz (115.7 kg) IBW/kg (Calculated) : 73 Heparin Dosing Weight: 95kg  Vital Signs: Temp: 97.5 F (36.4 C) (03/30 0740) Temp Source: Oral (03/30 0740) BP: 126/81 mmHg (03/30 0900) Pulse Rate: 87 (03/30 0900)  Labs:  Recent Labs  04/13/15 1730 04/14/15 0504 04/14/15 0807 04/15/15 0605 04/15/15 2350 04/16/15 0918  HGB  --  8.9*  --   --   --  9.7*  HCT  --  29.8*  --   --   --  30.6*  PLT  --  546*  --   --   --  494*  APTT 49*  --   --   --   --   --   LABPROT 27.7*  --  18.8* 18.5*  --  21.8*  INR 2.62*  --  1.57* 1.53*  --  1.91*  HEPARINUNFRC  --   --   --   --  <0.10* 0.26*  CREATININE  --  1.96*  --  1.45*  --  1.18    Estimated Creatinine Clearance: 98.6 mL/min (by C-G formula based on Cr of 1.18).  Medications:  Infusions:  . sodium chloride 10 mL/hr at 04/16/15 0600  . heparin 2,000 Units/hr (04/16/15 0840)  . norepinephrine (LEVOPHED) Adult infusion Stopped (04/13/15 2200)    Assessment: 58 yom on chronic coumadin for afib is now s/p drain placement. Warfarin had been held and reversed with vitamin K for the procedure but restarted on 3/29 along with heparin. INR is 1.91 today, H/H is slightly low as of yesterday and platelets are elevated. Heparin level remains slightly low but is increasing. No bleeding noted. Of note, he was started on flagyl which may increase the INR.  Goal of Therapy:  INR 2-3 Heparin level 0.3-0.7  units/ml Monitor platelets by anticoagulation protocol: Yes   Plan:  - Increase heparin gtt to 2300 units/hr - Check an 8 hour heparin level - Warfarin 5mg  PO x 1 tonight  - Daily heparin level, INR and CBC  Jennica Tagliaferri, Rande Lawman 04/16/2015,10:48 AM

## 2015-04-16 NOTE — Progress Notes (Signed)
[facility] Progress Note    Subjective: Patient doing okay.  Much better.  Could not void.  Has Foley.  Urine culture sent.  Objective: Vital signs in last 24 hours: Temp:  [97.4 F (36.3 C)-98.4 F (36.9 C)] 97.5 F (36.4 C) (03/30 0740) Pulse Rate:  [76-103] 96 (03/30 0700) Resp:  [14-26] 19 (03/30 0700) BP: (113-141)/(65-98) 125/73 mmHg (03/30 0700) SpO2:  [94 %-98 %] 98 % (03/30 0700) Weight:  [115.7 kg (255 lb 1.2 oz)] 115.7 kg (255 lb 1.2 oz) (03/30 0500) Last BM Date: 04/15/15  Intake/Output from previous day: 03/29 0701 - 03/30 0700 In: 789.4 [I.V.:539.4; IV Piggyback:250] Out: 1360 [Urine:1360] Intake/Output this shift:    General: No acute distress  Lungs: Clear  Abd: Soft, benign.  Turbid drainage from the new drain.  ? enteric  Extremities: No changes.  Neuro: Nintact  Lab Results:  @LABLAST2 (wbc:2,hgb:2,hct:2,plt:2) BMET ) Recent Labs  04/14/15 0504 04/15/15 0605  NA 142 140  K 4.2 3.7  CL 106 104  CO2 25 28  GLUCOSE 137* 137*  BUN 25* 21*  CREATININE 1.96* 1.45*  CALCIUM 9.6 9.3   PT/INR  Recent Labs  04/14/15 0807 04/15/15 0605  LABPROT 18.8* 18.5*  INR 1.57* 1.53*   ABG No results for input(s): PHART, HCO3 in the last 72 hours.  Invalid input(s): PCO2, PO2  Studies/Results: Ct Image Guided Fluid Drain By Catheter  04/15/2015  INDICATION: 50 year old male with a history of ischemic colitis status post bowel resection and colostomy complicated by multiple intra-abdominal abscesses. He has undergone placement of multiple percutaneous drainage catheters previously. Most recent CT imaging which was performed for persistent abdominal pain and intermittent low-grade fevers demonstrates a residual un treated subdiaphragmatic abscess. This abscess appears to communicate via a thin channel with residual fluid tracking along the anterior aspect of the left psoas muscle. There was an abscess in this location on prior imaging. He presents today  for drainage of these left retroperitoneal psoas and subdiaphragmatic collections. EXAM: CT IMAGE GUIDED FLUID DRAIN BY CATHETER MEDICATIONS: The patient is currently admitted to the hospital and receiving intravenous antibiotics. The antibiotics were administered within an appropriate time frame prior to the initiation of the procedure. ANESTHESIA/SEDATION: Fentanyl 150 mcg IV; Versed Three mg IV Moderate Sedation Time:  40 The patient was continuously monitored during the procedure by the interventional radiology nurse under my direct supervision. COMPLICATIONS: None immediate. PROCEDURE: Informed written consent was obtained from the patient after a thorough discussion of the procedural risks, benefits and alternatives. All questions were addressed. Maximal Sterile Barrier Technique was utilized including caps, mask, sterile gowns, sterile gloves, sterile drape, hand hygiene and skin antiseptic. A timeout was performed prior to the initiation of the procedure. A planning axial CT scan was performed. The fluid collection anterior to the left psoas muscle was successfully identified. A suitable skin entry site was selected and marked. The region was sterilely prepped and draped in standard fashion using chlorhexidine skin prep. Local anesthesia was attained by infiltration with 1% lidocaine. Using intermittent CT imaging, a 15 cm 18 gauge trocar needle was carefully advanced through the left lower quadrant abdominal wall and into the psoas fluid collection. A wire was then carefully manipulated in until it was successfully passed cephalad into the subdiaphragmatic collection. The skin tract was then serially dilated to 14 [place]. A Cook 53 [place] biliary drainage catheter was then advanced over the wire. Aspiration yields approximately 200 mL foul-smelling purulent fluid. Axial CT imaging demonstrates excellent position of  the drainage catheter and near complete collapse of the residual abscess cavity. The catheter  was connected to JP bulb suction and secured to the skin with 0 Prolene suture. IMPRESSION: Successful placement of a 89 French biliary drainage catheter so that it extends from the left psoas abscess cephalad into the left subdiaphragmatic abscess. Aspiration yields 200 mL foul-smelling purulent fluid. A sample was sent for culture. Signed, Criselda Peaches, MD [facility] Electronically Signed   By: Jacqulynn Cadet M.D.   On: 04/15/2015 09:54    Anti-infectives: Anti-infectives    Start     Dose/Rate Route Frequency Ordered Stop   04/16/15 0900  cefTRIAXone (ROCEPHIN) 2 g in dextrose 5 % 50 mL IVPB     2 g 100 mL/hr over 30 Minutes Intravenous Every 24 hours 04/16/15 0829     04/16/15 0900  metroNIDAZOLE (FLAGYL) tablet 500 mg     500 mg Oral 3 times per day 04/16/15 0829     04/15/15 2300  vancomycin (VANCOCIN) IVPB 750 mg/150 ml premix  Status:  Discontinued     750 mg 150 mL/hr over 60 Minutes Intravenous Every 12 hours 04/15/15 1323 04/16/15 0829   04/15/15 1200  vancomycin (VANCOCIN) IVPB 750 mg/150 ml premix  Status:  Discontinued     750 mg 150 mL/hr over 60 Minutes Intravenous Every 12 hours 04/15/15 1034 04/15/15 1314   04/13/15 1800  vancomycin (VANCOCIN) 1,250 mg in sodium chloride 0.9 % 250 mL IVPB  Status:  Discontinued     1,250 mg 166.7 mL/hr over 90 Minutes Intravenous Every 24 hours 04/13/15 0722 04/15/15 1034   04/13/15 1500  anidulafungin (ERAXIS) 100 mg in sodium chloride 0.9 % 100 mL IVPB  Status:  Discontinued     100 mg over 90 Minutes Intravenous Every 24 hours 04/12/15 1434 04/16/15 0829   04/12/15 1700  vancomycin (VANCOCIN) 1,500 mg in sodium chloride 0.9 % 500 mL IVPB  Status:  Discontinued     1,500 mg 250 mL/hr over 120 Minutes Intravenous Every 48 hours 04/12/15 1528 04/13/15 0722   04/12/15 1500  anidulafungin (ERAXIS) 200 mg in sodium chloride 0.9 % 200 mL IVPB     200 mg over 180 Minutes  Intravenous  Once 04/12/15 1434 04/12/15 1801   04/12/15 1430  anidulafungin (ERAXIS) 100 mg in sodium chloride 0.9 % 100 mL IVPB  Status:  Discontinued     100 mg over 90 Minutes Intravenous Every 24 hours 04/12/15 1418 04/12/15 1434   04/12/15 1000  piperacillin-tazobactam (ZOSYN) IVPB 3.375 g  Status:  Discontinued     3.375 g 12.5 mL/hr over 240 Minutes Intravenous 3 times per day 04/12/15 0545 04/16/15 0829   04/12/15 0345  vancomycin (VANCOCIN) IVPB 1000 mg/200 mL premix     1,000 mg 200 mL/hr over 60 Minutes Intravenous  Once 04/12/15 0334 04/12/15 0843   04/12/15 0130  piperacillin-tazobactam (ZOSYN) IVPB 3.375 g     3.375 g 100 mL/hr over 30 Minutes Intravenous  Once 04/12/15 0115 04/12/15 0215      Assessment/Plan: s/p  Needs PICC line for long term antibiotics at home. Will order and DC IJ when he has his PICC>  LOS: 4 days   Kathryne Eriksson. Dahlia Bailiff, MD, FACS [phone] [facility] 04/16/2015

## 2015-04-16 NOTE — Progress Notes (Signed)
Referring Physician(s):  Judeth Horn  Supervising Physician: Markus Daft  Chief Complaint:  Psoas abscess Subdiaphragmatic abscess Drain placed 3/28   Subjective:  Feeling better today Output of newest drain is purulent and bloody All other previous drain in lower abd with milky yellow output Have ordered flushes of all drains And record output afeb   Allergies: Contrast media  Medications: Prior to Admission medications   Medication Sig Start Date End Date Taking? Authorizing Provider  acetaminophen (TYLENOL) 500 MG tablet Take 500-1,000 mg by mouth every 6 (six) hours as needed for mild pain or headache.   Yes Historical Provider, MD  digoxin (LANOXIN) 0.125 MG tablet Take 1 tablet (0.125 mg total) by mouth daily. 03/23/15  Yes Sherran Needs, NP  diltiazem (CARDIZEM CD) 120 MG 24 hr capsule Take 1 capsule (120 mg total) by mouth daily. 03/23/15  Yes Sherran Needs, NP  diphenhydrAMINE (BENADRYL) 25 mg capsule Take 2 capsules (50 mg total) by mouth once. Patient taking differently: Take 50 mg by mouth as needed (for imaging scanes).  03/17/15  Yes Aletta Edouard, MD  feeding supplement (BOOST / RESOURCE BREEZE) LIQD Take 1 Container by mouth 3 (three) times daily between meals. 02/02/15  Yes Liberty Handy, MD  furosemide (LASIX) 40 MG tablet Take 1 tablet (40 mg total) by mouth daily. 03/23/15  Yes Sherran Needs, NP  lisinopril (PRINIVIL,ZESTRIL) 5 MG tablet Take 1 tablet (5 mg total) by mouth daily. 02/25/15  Yes Thompson Grayer, MD  Metoprolol Tartrate 75 MG TABS Take 150 mg by mouth 2 (two) times daily. 03/12/15  Yes Historical Provider, MD  oxyCODONE (OXY IR/ROXICODONE) 5 MG immediate release tablet Take 1-2 tablets (5-10 mg total) by mouth every 4 (four) hours as needed for moderate pain. 02/24/15  Yes Emina Riebock, NP  warfarin (COUMADIN) 5 MG tablet Take as directed by [facility] Patient taking differently: Take 5 mg by mouth daily.  03/09/15  Yes Thompson Grayer, MD    ciprofloxacin (CIPRO) 500 MG tablet Take 1 tablet (500 mg total) by mouth 2 (two) times daily. Patient not taking: Reported on 04/12/2015 02/24/15   Erby Pian, NP  metoprolol (LOPRESSOR) 100 MG tablet Take 1 tablet (100 mg total) by mouth 2 (two) times daily. Patient not taking: Reported on 04/12/2015 03/23/15   Sherran Needs, NP     Vital Signs: BP 126/81 mmHg  Pulse 87  Temp(Src) 97.5 F (36.4 C) (Oral)  Resp 18  Ht 5\' 10"  (1.778 m)  Wt 255 lb 1.2 oz (115.7 kg)  BMI 36.60 kg/m2  SpO2 99%  Physical Exam  Pulmonary/Chest: Effort normal and breath sounds normal.  Abdominal: Soft.  Neurological: He is alert.  Skin: Skin is warm and dry.  Skin site of newest drain is clean and dry Tender to touch Output bloody purulent  20 cc in bulb  All 3 previous drains intact Output milky yellow 20 cc in one drain bag---others are scant  afeb  Nursing note and vitals reviewed.   Imaging: Ct Image Guided Fluid Drain By Catheter  04/15/2015  INDICATION: 50 year old male with a history of ischemic colitis status post bowel resection and colostomy complicated by multiple intra-abdominal abscesses. He has undergone placement of multiple percutaneous drainage catheters previously. Most recent CT imaging which was performed for persistent abdominal pain and intermittent low-grade fevers demonstrates a residual un treated subdiaphragmatic abscess. This abscess appears to communicate via a thin channel with residual fluid tracking along the anterior aspect  of the left psoas muscle. There was an abscess in this location on prior imaging. He presents today for drainage of these left retroperitoneal psoas and subdiaphragmatic collections. EXAM: CT IMAGE GUIDED FLUID DRAIN BY CATHETER MEDICATIONS: The patient is currently admitted to the hospital and receiving intravenous antibiotics. The antibiotics were administered within an appropriate time frame prior to the initiation of the procedure.  ANESTHESIA/SEDATION: Fentanyl 150 mcg IV; Versed Three mg IV Moderate Sedation Time:  40 The patient was continuously monitored during the procedure by the interventional radiology nurse under my direct supervision. COMPLICATIONS: None immediate. PROCEDURE: Informed written consent was obtained from the patient after a thorough discussion of the procedural risks, benefits and alternatives. All questions were addressed. Maximal Sterile Barrier Technique was utilized including caps, mask, sterile gowns, sterile gloves, sterile drape, hand hygiene and skin antiseptic. A timeout was performed prior to the initiation of the procedure. A planning axial CT scan was performed. The fluid collection anterior to the left psoas muscle was successfully identified. A suitable skin entry site was selected and marked. The region was sterilely prepped and draped in standard fashion using chlorhexidine skin prep. Local anesthesia was attained by infiltration with 1% lidocaine. Using intermittent CT imaging, a 15 cm 18 gauge trocar needle was carefully advanced through the left lower quadrant abdominal wall and into the psoas fluid collection. A wire was then carefully manipulated in until it was successfully passed cephalad into the subdiaphragmatic collection. The skin tract was then serially dilated to 14 [place]. A Cook 35 [place] biliary drainage catheter was then advanced over the wire. Aspiration yields approximately 200 mL foul-smelling purulent fluid. Axial CT imaging demonstrates excellent position of the drainage catheter and near complete collapse of the residual abscess cavity. The catheter was connected to JP bulb suction and secured to the skin with 0 Prolene suture. IMPRESSION: Successful placement of a 54 French biliary drainage catheter so that it extends from the left psoas abscess cephalad into the left subdiaphragmatic abscess. Aspiration yields 200 mL foul-smelling purulent fluid. A sample was sent for culture.  Signed, Criselda Peaches, MD [facility] Electronically Signed   By: Jacqulynn Cadet M.D.   On: 04/15/2015 09:54    Labs:  CBC:  Recent Labs  04/12/15 2331 04/13/15 0509 04/14/15 0504 04/16/15 0918  WBC 12.6* 11.4* 10.4 12.3*  HGB 10.0* 9.3* 8.9* 9.7*  HCT 30.9* 30.3* 29.8* 30.6*  PLT 560* 561* 546* 494*    COAGS:  Recent Labs  04/13/15 1730 04/14/15 0807 04/15/15 0605 04/16/15 0918  INR 2.62* 1.57* 1.53* 1.91*  APTT 49*  --   --   --     BMP:  Recent Labs  04/13/15 0509 04/14/15 0504 04/15/15 0605 04/16/15 0918  NA 142 142 140 141  K 4.4 4.2 3.7 3.2*  CL 104 106 104 105  CO2 28 25 28 29   GLUCOSE 111* 137* 137* 119*  BUN 26* 25* 21* 15  CALCIUM 9.2 9.6 9.3 9.0  CREATININE 2.10* 1.96* 1.45* 1.18  GFRNONAA 36* 39* 56* >60  GFRAA 42* 45* >60 >60    LIVER FUNCTION TESTS:  Recent Labs  04/11/15 2358 04/12/15 1430 04/14/15 0504 04/15/15 0605  BILITOT 0.4 0.3 0.6 0.4  AST 22 14* 13* 13*  ALT 44 31 23 18   ALKPHOS 86 68 59 55  PROT 8.4* 7.1 7.1 6.6  ALBUMIN 2.9* 2.3* 2.5* 2.3*    Assessment and Plan: Ischemic colitis post bowel resection and  colostomy Many abscesses Newest drain left psoas/subdiaphragmatic abscesses Better today Will follow   Electronically Signed: Shanera Meske A 04/16/2015, 11:55 AM   I spent a total of 15 Minutes at the the patient's bedside AND on the patient's hospital floor or unit, greater than 50% of which was counseling/coordinating care for abd abscess

## 2015-04-16 NOTE — Progress Notes (Signed)
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 He was in septic shock with non-oliguric AKI (Cr 4.09) and required ICU admission for pressor support which he required for one day as well as stress steroids. He also required amiodarone drip for uncontrolled afib. He had biliary drain placed into the left psoas abscess and subdiaphragmatic abscess with 200 mL purulent drainage on 3/28. He is now on heparin and coumadin for bridging for afib. He was on vancomycin, zosyn, and eraxis but now on ceftriaxone and PO flagyl. His cultures are pending with no growth to date. He has GPC on gram stain. He will likely need IV antibiotics on discharge per ID for 4 weeks depending on drainage. He was seen by psychiatry for depression/anxiety and started on lexapro.   He had perforated ischemic colitis (initially thought to be due to perforated diverticulitis) requiring left colectomy and end colostomy while on pradaxa on December 31st 2016 after he presented with 1 week history of LLQ abdominal pain. CT imaging on 01/28/15 revealed a rectal stump blowout with subsequent pelvic abscess development which he had drained on 01/29/15. Cultures revealed E. Coli and he was discharged on 10-day course of cefdinir. On 03/02/15 his left abdominal drain was exchanged secondary to partial dislodgment. Due to persistent leakage around the drainage catheter he had  conversion of the existing drain to 2 biliary drainage catheters in addition to APD on 03/03/15. He had small bowel follow through a week before he was admitted that did no show small bowel leak. He was on PO ciprofloxacin from 02/24/15 which was stooped 4? weeks prior to Panacea. At follow-up appointment on 03/27/15 he was still taking ciprofloxacin. He follows with surgeon Dr. Donne Hazel.   This morning he reports he is doing well but had LLQ pain and inability to void requiring foley catheter placement. He is requiring fentanyl regularly. He has been able to tolerate regular diet.    Objective: Vital signs in last 24 hours: Filed Vitals:   04/16/15 0400 04/16/15 0500 04/16/15 0700 04/16/15 0740  BP: 116/78  125/73   Pulse: 76  96   Temp:    97.5 F (36.4 C)  TempSrc:    Oral  Resp: 14  19   Height:      Weight:  255 lb 1.2 oz (115.7 kg)    SpO2: 96%  98%    Weight change: 5 lb 1.1 oz (2.3 kg)  Intake/Output Summary (Last 24 hours) at 04/16/15 [id number] Last data filed at 04/16/15 [id number]  Gross per 24 hour  Intake    586 ml  Output   1300 ml  Net   -714 ml    Physical Exam  Constitutional: He is oriented to person, place, and time. He appears well-developed and well-nourished. No distress.  HENT:  Head: Normocephalic and atraumatic.  Right Ear: External ear normal.  Left Ear: External ear normal.  Nose: Nose normal.  Mouth/Throat: Oropharynx is clear and moist. No oropharyngeal exudate.  Eyes: EOM are normal. Right  eye exhibits no discharge. Left eye exhibits no discharge. No scleral icterus.  Neck: Normal range of motion. Neck supple.  Cardiovascular:  Tachycardic with irregularly irregular rhythm  Pulmonary/Chest: Effort normal and breath sounds normal. No respiratory distress. He has no wheezes. He has no rales.  Abdominal: Soft. Bowel sounds are normal. He exhibits no distension. There is no tenderness. There is no rebound and no guarding.  4 drains in place in left side of  abdomen   Musculoskeletal: Normal range of motion. He exhibits no edema or tenderness.  SCD's on b/l LE  Neurological: He is alert and oriented to person, place, and time.  Skin: Skin is warm and dry. No rash noted. He is not diaphoretic. No erythema. No pallor.  Psychiatric: He has a normal mood and affect. His behavior is normal. Judgment and thought content normal.    Lab Results: Basic Metabolic Panel:  Recent Labs Lab 04/12/15 1430  04/14/15 0504 04/15/15 0605  NA 138  < > 142 140  K 4.8  < > 4.2 3.7  CL 105  < > 106 104  CO2 24  < > 25 28  GLUCOSE 136*  < > 137* 137*  BUN 35*  < > 25* 21*  CREATININE 2.78*  < > 1.96* 1.45*  CALCIUM 9.3  < > 9.6 9.3  MG 2.0  --   --   --   PHOS 5.1*  --  3.5  --   < > = values in this interval not displayed. Liver Function Tests:  Recent Labs Lab 04/14/15 0504 04/15/15 0605  AST 13* 13*  ALT 23 18  ALKPHOS 59 55  BILITOT 0.6 0.4  PROT 7.1 6.6  ALBUMIN 2.5* 2.3*    Recent Labs Lab 04/12/15 0210  LIPASE 24   CBC:  Recent Labs Lab 04/11/15 2358  04/13/15 0509 04/14/15 0504  WBC 21.9*  < > 11.4* 10.4  NEUTROABS 17.3*  --   --  8.9*  HGB 11.5*  < > 9.3* 8.9*  HCT 36.2*  < > 30.3* 29.8*  MCV 93.3  < > 93.5 95.5  PLT 619*  < > 561* 546*  < > = values in this interval not displayed.  CBG:  Recent Labs Lab 04/15/15 1158 04/15/15 1513 04/15/15 1955 04/15/15 2325 04/16/15 0343 04/16/15 0735  GLUCAP 157* 141* 149* 106* 126* 141*   Coagulation:  Recent Labs Lab 04/13/15 1040 04/13/15 1730 04/14/15 0807 04/15/15 0605  LABPROT 43.9* 27.7* 18.8* 18.5*  INR 4.84* 2.62* 1.57* 1.53*   Urinalysis:  Recent Labs Lab 04/12/15 0111 04/16/15 0001  COLORURINE AMBER* YELLOW  LABSPEC 1.025 1.021  PHURINE 5.0 6.5  GLUCOSEU NEGATIVE NEGATIVE  HGBUR SMALL* TRACE*  BILIRUBINUR SMALL* NEGATIVE  KETONESUR 15* NEGATIVE  PROTEINUR 30* NEGATIVE  NITRITE NEGATIVE NEGATIVE  LEUKOCYTESUR SMALL* MODERATE*    Micro  Results: Recent Results (from the past 240 hour(s))  Blood Culture (routine x 2)     Status: None (Preliminary result)   Collection Time: 04/11/15 11:58 PM  Result Value Ref Range Status   Specimen Description BLOOD LEFT ARM  Final   Special Requests BOTTLES DRAWN AEROBIC AND ANAEROBIC 5CC  Final   Culture NO GROWTH 3 DAYS  Final   Report Status PENDING  Incomplete  Blood Culture (routine x 2)     Status: None (Preliminary result)   Collection Time: 04/12/15 12:03 AM  Result Value Ref Range Status   Specimen Description BLOOD LEFT ARM  Final  Special Requests BOTTLES DRAWN AEROBIC AND ANAEROBIC 5CC  Final   Culture NO GROWTH 3 DAYS  Final   Report Status PENDING  Incomplete  Urine culture     Status: None   Collection Time: 04/12/15  1:09 AM  Result Value Ref Range Status   Specimen Description URINE, CLEAN CATCH  Final   Special Requests NONE  Final   Culture MULTIPLE SPECIES PRESENT, SUGGEST RECOLLECTION  Final   Report Status 04/13/2015 FINAL  Final  C difficile quick scan w PCR reflex     Status: None   Collection Time: 04/12/15  7:17 AM  Result Value Ref Range Status   C Diff antigen NEGATIVE NEGATIVE Final   C Diff toxin NEGATIVE NEGATIVE Final   C Diff interpretation Negative for toxigenic C. difficile  Final  Gastrointestinal Panel by PCR , Stool     Status: None   Collection Time: 04/12/15  7:17 AM  Result Value Ref Range Status   Campylobacter species NOT DETECTED NOT DETECTED Final   Plesimonas shigelloides NOT DETECTED NOT DETECTED Final   Salmonella species NOT DETECTED NOT DETECTED Final   Yersinia enterocolitica NOT DETECTED NOT DETECTED Final   Vibrio species NOT DETECTED NOT DETECTED Final   Vibrio cholerae NOT DETECTED NOT DETECTED Final   Enteroaggregative E coli (EAEC) NOT DETECTED NOT DETECTED Final   Enteropathogenic E coli (EPEC) NOT DETECTED NOT DETECTED Final   Enterotoxigenic E coli (ETEC) NOT DETECTED NOT DETECTED Final   Shiga like toxin producing  E coli (STEC) NOT DETECTED NOT DETECTED Final   E. coli O157 NOT DETECTED NOT DETECTED Final   Shigella/Enteroinvasive E coli (EIEC) NOT DETECTED NOT DETECTED Final   Cryptosporidium NOT DETECTED NOT DETECTED Final   Cyclospora cayetanensis NOT DETECTED NOT DETECTED Final   Entamoeba histolytica NOT DETECTED NOT DETECTED Final   Giardia lamblia NOT DETECTED NOT DETECTED Final   Adenovirus F40/41 NOT DETECTED NOT DETECTED Final   Astrovirus NOT DETECTED NOT DETECTED Final   Norovirus GI/GII NOT DETECTED NOT DETECTED Final   Rotavirus A NOT DETECTED NOT DETECTED Final   Sapovirus (I, II, IV, and V) NOT DETECTED NOT DETECTED Final  MRSA PCR Screening     Status: None   Collection Time: 04/12/15  2:33 PM  Result Value Ref Range Status   MRSA by PCR NEGATIVE NEGATIVE Final    Comment:        The GeneXpert MRSA Assay (FDA approved for NASAL specimens only), is one component of a comprehensive MRSA colonization surveillance program. It is not intended to diagnose MRSA infection nor to guide or monitor treatment for MRSA infections.   Culture, routine-abscess     Status: None (Preliminary result)   Collection Time: 04/14/15  1:31 PM  Result Value Ref Range Status   Specimen Description ABSCESS PERITONEAL CAVITY  Final   Special Requests LEFT PSOAS AND SUBDIAPHRAGMATIC ABSCESS  Final   Gram Stain   Final    ABUNDANT WBC PRESENT, PREDOMINANTLY PMN NO SQUAMOUS EPITHELIAL CELLS SEEN FEW GRAM POSITIVE COCCI IN PAIRS Performed at [facility]    Culture   Final    NO GROWTH 1 DAY Performed at [facility]    Report Status PENDING  Incomplete   Studies/Results: Ct Image Guided Fluid Drain By Catheter  04/15/2015  INDICATION: 50 year old male with a history of ischemic colitis status post bowel resection and colostomy complicated by multiple intra-abdominal abscesses. He has undergone placement of multiple percutaneous drainage catheters previously. Most  recent CT  imaging which was performed for persistent abdominal pain and intermittent low-grade fevers demonstrates a residual un treated subdiaphragmatic abscess. This abscess appears to communicate via a thin channel with residual fluid tracking along the anterior aspect of the left psoas muscle. There was an abscess in this location on prior imaging. He presents today for drainage of these left retroperitoneal psoas and subdiaphragmatic collections. EXAM: CT IMAGE GUIDED FLUID DRAIN BY CATHETER MEDICATIONS: The patient is currently admitted to the hospital and receiving intravenous antibiotics. The antibiotics were administered within an appropriate time frame prior to the initiation of the procedure. ANESTHESIA/SEDATION: Fentanyl 150 mcg IV; Versed Three mg IV Moderate Sedation Time:  40 The patient was continuously monitored during the procedure by the interventional radiology nurse under my direct supervision. COMPLICATIONS: None immediate. PROCEDURE: Informed written consent was obtained from the patient after a thorough discussion of the procedural risks, benefits and alternatives. All questions were addressed. Maximal Sterile Barrier Technique was utilized including caps, mask, sterile gowns, sterile gloves, sterile drape, hand hygiene and skin antiseptic. A timeout was performed prior to the initiation of the procedure. A planning axial CT scan was performed. The fluid collection anterior to the left psoas muscle was successfully identified. A suitable skin entry site was selected and marked. The region was sterilely prepped and draped in standard fashion using chlorhexidine skin prep. Local anesthesia was attained by infiltration with 1% lidocaine. Using intermittent CT imaging, a 15 cm 18 gauge trocar needle was carefully advanced through the left lower quadrant abdominal wall and into the psoas fluid collection. A wire was then carefully manipulated in until it was successfully passed cephalad into the  subdiaphragmatic collection. The skin tract was then serially dilated to 14 [place]. A Cook 49 [place] biliary drainage catheter was then advanced over the wire. Aspiration yields approximately 200 mL foul-smelling purulent fluid. Axial CT imaging demonstrates excellent position of the drainage catheter and near complete collapse of the residual abscess cavity. The catheter was connected to JP bulb suction and secured to the skin with 0 Prolene suture. IMPRESSION: Successful placement of a 90 French biliary drainage catheter so that it extends from the left psoas abscess cephalad into the left subdiaphragmatic abscess. Aspiration yields 200 mL foul-smelling purulent fluid. A sample was sent for culture. Signed, Criselda Peaches, MD [facility] Electronically Signed   By: Jacqulynn Cadet M.D.   On: 04/15/2015 09:54   Medications: I have reviewed the patient's current medications. Scheduled Meds: . antiseptic oral rinse  7 mL Mouth Rinse BID  . cefTRIAXone (ROCEPHIN)  IV  2 g Intravenous Q24H  . digoxin  0.125 mg Oral Daily  . escitalopram  10 mg Oral Daily  . hydrocortisone sod succinate (SOLU-CORTEF) inj  50 mg Intravenous Q12H  . insulin aspart  0-15 Units Subcutaneous 6 times per day  . metroNIDAZOLE  500 mg Oral 3 times per day  . neomycin-bacitracin-polymyxin   Topical Daily  . Warfarin - Pharmacist Dosing Inpatient   Does not apply q1800   Continuous Infusions: . sodium chloride 10 mL/hr at 04/16/15 0600  . heparin 2,000 Units/hr (04/16/15 0840)  . 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 He had biliary drain  placed into the left psoas abscess and subdiaphragmatic abscess with 200 mL purulent drainage on 3/28.  He is tolerating regular diet and is afebrile with WBC 12.3 K. Gram stain with GPC. Still with pain requiring frequent fentanyl. -Appreciate surgery and ID following  -Continue regular diet and obtain nutrition consult -IV ceftriaxone 2 g daily and PO flagyl for 4 week course per ID (depending on drainage) -Wean off solu-cortef from 50 mg BID to 50 mg daily tomorrow and then stop -PICC line to be placed for long-term IV antibiotic therapy  -Follow-up would abscess culture and blood cultures  -Start home oxy IR 5-10 mg Q 4 hr PRN pain -Wean fentanyl from 25 mcg Q 2 hr to 4 hr PRN severe pain -Obtain PT and OT consults   Atrial Fibrillation - Currently mildly tachycardic with irregularly irregular rhythm. INR today 1.91.  -Continue IV heparin and coumadin per pharmacy  -Daily INR with goal 2-3 for 24 hrs before discontinuing IV heparin  -Continue digoxin 0.125 mg daily   Nonoliguric AKI - Cr 1.18 almost back to baseline near 0.7-0.9 from 4.09 from admission with normal urine output. Etiology most likely ischemic ATN in setting of septic shock on admission.  -Appreciate nephrology following -Monitor BMP -Monitor daily weights and strict I & O's -Avoid nephrotoxins   Chronic normocytic anemia - Hg 9.7 with no active bleeding on [facility] therapy since January of 2017. Etiology most likely due to chronic illness vs IDA from malabsorption.  -Obtain anemia panel and smear review  -Monitor CBC  Depression and anxiety - Currently with stable mood.  -Appreciate psychiatry following -Continue lexapro 10 mg daily  -Continue ambien 5 mg PRN bedtime for insomnia   Prediabetes - Last A1c 6.1 on 01/15/15. -Continue moderate SSI before meals and at bedtime  -Encourage lifestyle modification  Diet: Regular DVT PPX: coumadin and heparin Code: Full   Dispo:  Disposition is deferred at this time, awaiting improvement of current medical problems.  Anticipated discharge in approximately 2-4 day(s).   The patient does have a current PCP (No Pcp Per Patient) and does need an [facility] hospital follow-up appointment after discharge.  The patient does not have transportation limitations that hinder transportation to clinic appointments.  .Services Needed at time of discharge: Y = Yes, Blank = No PT:   OT:   RN:   Equipment:   Other:     LOS: 4 days   Juluis Mire, MD 04/16/2015, 9:18 AM

## 2015-04-16 NOTE — Progress Notes (Signed)
Nutrition Follow-up  DOCUMENTATION CODES:   Obesity unspecified  INTERVENTION:  -Continue Ensure Enlive BID, 350 kcal, 20 grams of protein per bottle   NUTRITION DIAGNOSIS:   Inadequate oral intake related to poor appetite as evidenced by per patient/family report.  -Ongoing   GOAL:   Patient will meet greater than or equal to 90% of their needs  -Progressing   MONITOR:   PO intake, Supplement acceptance, Labs, Weight trends  ASSESSMENT:   50 yo male with ischemic bowel disease who underwent Ex-lap with left, colectomy, with end colostomy which was complicated by intra-abdominal abscess and rectal stump leak in December of 2016. Intra-abdominal drains were placed. He has had multiple hospitalization from intra-abdominal complications. He was on chronic ciprofloxacin for 3-4 months and this was stopped 4 weeks ago. Over the last week he has noted increased LLQ abdominal pain with bloating, n/v and profuse ostomy output  3/30-RD consult for nutrition assessment  Pt states he does not have a "big appetite", but reports appetite is at his baseline. Per chart pt is consuming 30%-95% of meals. Pt states he is requesting Ensure Enlive from nurses, will continue for pt BID. Pt stated at initial visit (3/27) that he's usual diet consisted of Ensure Enlive BID.   Pt states that he was actively trying to loose weight PTA. He was making lifestyle modifications such as no fried foods and consuming more fruits.   Medications reviewed, . Labs reviewed; potassium 3.2 low  Diet Order:  Diet regular Room service appropriate?: Yes; Fluid consistency:: Thin  Skin:  Reviewed, no issues  Last BM:  04/12/2015  Height:   Ht Readings from Last 1 Encounters:  04/13/15 5\' 10"  (1.778 m)    Weight:   Wt Readings from Last 1 Encounters:  04/16/15 255 lb 1.2 oz (115.7 kg)    Ideal Body Weight:  75.4 kg  BMI:  Body mass index is 36.6 kg/(m^2).  Estimated Nutritional Needs:   Kcal:   1900-2100  Protein:  100-110 grams (1.3 g/IBW)  Fluid:  >/= 2 L   EDUCATION NEEDS:   No education needs identified at this time  [place], Dietetic Intern Pager: [phone]

## 2015-04-16 NOTE — Progress Notes (Signed)
ANTICOAGULATION CONSULT NOTE - Follow Up Consult  Pharmacy Consult for heparin Indication: atrial fibrillation   Labs:  Recent Labs  04/13/15 0509  04/13/15 1730 04/14/15 0504 04/14/15 0807 04/15/15 0605 04/15/15 2350  HGB 9.3*  --   --  8.9*  --   --   --   HCT 30.3*  --   --  29.8*  --   --   --   PLT 561*  --   --  546*  --   --   --   APTT  --   --  49*  --   --   --   --   LABPROT  --   < > 27.7*  --  18.8* 18.5*  --   INR  --   < > 2.62*  --  1.57* 1.53*  --   HEPARINUNFRC  --   --   --   --   --   --  <0.10*  CREATININE 2.10*  --   --  1.96*  --  1.45*  --   < > = values in this interval not displayed.   Assessment: 50yo male undetectable on heparin with initial dosing for low INR.  Goal of Therapy:  Heparin level 0.3-0.7 units/ml   Plan:  Will increase heparin gtt by 4 units/kg/hr to 2000 units/hr and check level in [place], PharmD, BCPS  04/16/2015,12:43 AM

## 2015-04-16 NOTE — Progress Notes (Signed)
[place] [facility]   Reason for visit: Follow up on intraabdominal abscess  Interval History: GPC on gram stain, culture remains negative.  WBC 12.3   Physical Exam: Constitutional:  Filed Vitals:   04/16/15 0800 04/16/15 0900  BP: 125/83 126/81  Pulse: 94 87  Temp:    Resp: 19 18   patient appears in NAD Respiratory: Normal respiratory effort; CTA B Cardiovascular: RRR GI: left drain X3 and 1 more superior  Review of Systems: Constitutional: negative for fevers and chills Gastrointestinal: negative for nausea and diarrhea  Lab Results  Component Value Date   WBC 12.3* 04/16/2015   HGB 9.7* 04/16/2015   HCT 30.6* 04/16/2015   MCV 94.4 04/16/2015   PLT 494* 04/16/2015    Lab Results  Component Value Date   CREATININE 1.45* 04/15/2015   BUN 21* 04/15/2015   NA 140 04/15/2015   K 3.7 04/15/2015   CL 104 04/15/2015   CO2 28 04/15/2015    Lab Results  Component Value Date   ALT 18 04/15/2015   AST 13* 04/15/2015   ALKPHOS 55 04/15/2015     Microbiology: Recent Results (from the past 240 hour(s))  Blood Culture (routine x 2)     Status: None (Preliminary result)   Collection Time: 04/11/15 11:58 PM  Result Value Ref Range Status   Specimen Description BLOOD LEFT ARM  Final   Special Requests BOTTLES DRAWN AEROBIC AND ANAEROBIC 5CC  Final   Culture NO GROWTH 3 DAYS  Final   Report Status PENDING  Incomplete  Blood Culture (routine x 2)     Status: None (Preliminary result)   Collection Time: 04/12/15 12:03 AM  Result Value Ref Range Status   Specimen Description BLOOD LEFT ARM  Final   Special Requests BOTTLES DRAWN AEROBIC AND ANAEROBIC 5CC  Final   Culture NO GROWTH 3 DAYS  Final   Report Status PENDING  Incomplete  Urine culture     Status: None   Collection Time: 04/12/15  1:09 AM  Result Value Ref Range Status   Specimen Description URINE, CLEAN CATCH  Final   Special Requests NONE  Final   Culture MULTIPLE SPECIES PRESENT,  SUGGEST RECOLLECTION  Final   Report Status 04/13/2015 FINAL  Final  C difficile quick scan w PCR reflex     Status: None   Collection Time: 04/12/15  7:17 AM  Result Value Ref Range Status   C Diff antigen NEGATIVE NEGATIVE Final   C Diff toxin NEGATIVE NEGATIVE Final   C Diff interpretation Negative for toxigenic C. difficile  Final  Gastrointestinal Panel by PCR , Stool     Status: None   Collection Time: 04/12/15  7:17 AM  Result Value Ref Range Status   Campylobacter species NOT DETECTED NOT DETECTED Final   Plesimonas shigelloides NOT DETECTED NOT DETECTED Final   Salmonella species NOT DETECTED NOT DETECTED Final   Yersinia enterocolitica NOT DETECTED NOT DETECTED Final   Vibrio species NOT DETECTED NOT DETECTED Final   Vibrio cholerae NOT DETECTED NOT DETECTED Final   Enteroaggregative E coli (EAEC) NOT DETECTED NOT DETECTED Final   Enteropathogenic E coli (EPEC) NOT DETECTED NOT DETECTED Final   Enterotoxigenic E coli (ETEC) NOT DETECTED NOT DETECTED Final   Shiga like toxin producing E coli (STEC) NOT DETECTED NOT DETECTED Final   E. coli O157 NOT DETECTED NOT DETECTED Final   Shigella/Enteroinvasive E coli (EIEC) NOT DETECTED NOT DETECTED Final   Cryptosporidium NOT  DETECTED NOT DETECTED Final   Cyclospora cayetanensis NOT DETECTED NOT DETECTED Final   Entamoeba histolytica NOT DETECTED NOT DETECTED Final   Giardia lamblia NOT DETECTED NOT DETECTED Final   Adenovirus F40/41 NOT DETECTED NOT DETECTED Final   Astrovirus NOT DETECTED NOT DETECTED Final   Norovirus GI/GII NOT DETECTED NOT DETECTED Final   Rotavirus A NOT DETECTED NOT DETECTED Final   Sapovirus (I, II, IV, and V) NOT DETECTED NOT DETECTED Final  MRSA PCR Screening     Status: None   Collection Time: 04/12/15  2:33 PM  Result Value Ref Range Status   MRSA by PCR NEGATIVE NEGATIVE Final    Comment:        The GeneXpert MRSA Assay (FDA approved for NASAL specimens only), is one component of a comprehensive  MRSA colonization surveillance program. It is not intended to diagnose MRSA infection nor to guide or monitor treatment for MRSA infections.   Culture, routine-abscess     Status: None (Preliminary result)   Collection Time: 04/14/15  1:31 PM  Result Value Ref Range Status   Specimen Description ABSCESS PERITONEAL CAVITY  Final   Special Requests LEFT PSOAS AND SUBDIAPHRAGMATIC ABSCESS  Final   Gram Stain   Final    ABUNDANT WBC PRESENT, PREDOMINANTLY PMN NO SQUAMOUS EPITHELIAL CELLS SEEN FEW GRAM POSITIVE COCCI IN PAIRS Performed at [facility]    Culture   Final    NO GROWTH 1 DAY Performed at [facility]    Report Status PENDING  Incomplete    Impression/Plan:  1. Intra abdominal abscess - no growth to date but noted gram stain.  Have changed to iv ceftriaxone 2 grams daily and flagyl 500 mg three times per day.   -plan on 4 weeks, but will depend on drainage (longer or shorter)  2. dispo - picc line ordered.

## 2015-04-16 NOTE — Progress Notes (Signed)
[place] for warfarin, heparin Indication: atrial fibrillation  Allergies  Allergen Reactions  . Contrast Media [Iodinated Diagnostic Agents] Rash    a diffuse macular rash after CTA chest  Pt was premedicated with 125mg  IV Solumedrol, 50mg  IV Benadryl 1 hr prior to CTexam, and tolerated procedure without any difficulties. 11/28/11 Also same pre med protocol observed on 02/22/15 and pt tolerated the procedure well    Patient Measurements: Height: 5\' 10"  (177.8 cm) Weight: 255 lb 1.2 oz (115.7 kg) IBW/kg (Calculated) : 73 Heparin Dosing Weight: 95kg  Vital Signs: Temp: 98.3 F (36.8 C) (03/30 1514) Temp Source: Oral (03/30 1514) BP: 137/82 mmHg (03/30 1600) Pulse Rate: 88 (03/30 1600)  Labs:  Recent Labs  04/14/15 0504 04/14/15 0807 04/15/15 0605 04/15/15 2350 04/16/15 0918 04/16/15 1821  HGB 8.9*  --   --   --  9.7*  --   HCT 29.8*  --   --   --  30.6*  --   PLT 546*  --   --   --  494*  --   LABPROT  --  18.8* 18.5*  --  21.8*  --   INR  --  1.57* 1.53*  --  1.91*  --   HEPARINUNFRC  --   --   --  <0.10* 0.26* 0.26*  CREATININE 1.96*  --  1.45*  --  1.18  --     Estimated Creatinine Clearance: 98.6 mL/min (by C-G formula based on Cr of 1.18).  Medications:  Infusions:  . sodium chloride 10 mL/hr at 04/16/15 0600  . heparin 2,300 Units/hr (04/16/15 1113)    Assessment: 42 yom on chronic coumadin for afib is now s/p drain placement. Warfarin had been held and reversed with vitamin K for the procedure but restarted on 3/29 along with heparin. INR is 1.91 today, H/H is slightly low as of yesterday and platelets are elevated. Heparin level remains slightly low but is increasing. No bleeding noted. Of note, he was started on flagyl which may increase the INR.  Goal of Therapy:  INR 2-3 Heparin level 0.3-0.7 units/ml Monitor platelets by anticoagulation protocol: Yes   Plan:  - Increase heparin gtt to 2300 units/hr - Check  an 8 hour heparin level - Warfarin 5mg  PO x 1 tonight  - Daily heparin level, INR and CBC  ---------------  Addendum -HL still subtherapeutic -Increase rate to 2500 units/hr -Check confirmatory level with AM labs  Harvel Quale  04/16/2015 7:32 PM

## 2015-04-16 NOTE — Progress Notes (Signed)
Peripherally Inserted Central Catheter/Midline Placement  The IV Nurse has discussed with the patient and/or persons authorized to consent for the patient, the purpose of this procedure and the potential benefits and risks involved with this procedure.  The benefits include less needle sticks, lab draws from the catheter and patient may be discharged home with the catheter.  Risks include, but not limited to, infection, bleeding, blood clot (thrombus formation), and puncture of an artery; nerve damage and irregular heat beat.  Alternatives to this procedure were also discussed.  PICC/Midline Placement Documentation        William Fischer 04/16/2015, 12:41 PM Consent obtained by Claretha Cooper, RN

## 2015-04-17 DIAGNOSIS — Z95828 Presence of other vascular implants and grafts: Secondary | ICD-10-CM

## 2015-04-17 DIAGNOSIS — K551 Chronic vascular disorders of intestine: Secondary | ICD-10-CM

## 2015-04-17 LAB — CBC
HEMATOCRIT: 30.6 % — AB (ref 39.0–52.0)
HEMOGLOBIN: 9.5 g/dL — AB (ref 13.0–17.0)
MCH: 29.3 pg (ref 26.0–34.0)
MCHC: 31 g/dL (ref 30.0–36.0)
MCV: 94.4 fL (ref 78.0–100.0)
Platelets: 526 10*3/uL — ABNORMAL HIGH (ref 150–400)
RBC: 3.24 MIL/uL — AB (ref 4.22–5.81)
RDW: 16.5 % — ABNORMAL HIGH (ref 11.5–15.5)
WBC: 11.9 10*3/uL — AB (ref 4.0–10.5)

## 2015-04-17 LAB — CULTURE, BLOOD (ROUTINE X 2)
CULTURE: NO GROWTH
Culture: NO GROWTH

## 2015-04-17 LAB — CULTURE, ROUTINE-ABSCESS: CULTURE: NO GROWTH

## 2015-04-17 LAB — TECHNOLOGIST SMEAR REVIEW

## 2015-04-17 LAB — GLUCOSE, CAPILLARY
GLUCOSE-CAPILLARY: 144 mg/dL — AB (ref 65–99)
Glucose-Capillary: 92 mg/dL (ref 65–99)
Glucose-Capillary: 87 mg/dL (ref 65–99)
Glucose-Capillary: 136 mg/dL — ABNORMAL HIGH (ref 65–99)

## 2015-04-17 LAB — FOLATE: FOLATE: 11.6 ng/mL (ref >5.9)

## 2015-04-17 LAB — IRON AND TIBC
IRON: 73 ug/dL (ref 45–182)
SATURATION RATIOS: 34 % (ref 17.9–39.5)
TIBC: 216 ug/dL — ABNORMAL LOW (ref 250–450)
UIBC: 143 ug/dL

## 2015-04-17 LAB — PHOSPHORUS: PHOSPHORUS: 3.3 mg/dL (ref 2.5–4.6)

## 2015-04-17 LAB — BASIC METABOLIC PANEL
ANION GAP: 8 (ref 5–15)
BUN: 12 mg/dL (ref 6–20)
CHLORIDE: 105 mmol/L (ref 101–111)
CO2: 28 mmol/L (ref 22–32)
CREATININE: 1.09 mg/dL (ref 0.61–1.24)
Calcium: 9.1 mg/dL (ref 8.9–10.3)
GFR calc non Af Amer: >60 mL/min (ref >60)
Glucose, Bld: 127 mg/dL — ABNORMAL HIGH (ref 65–99)
POTASSIUM: 3.3 mmol/L — AB (ref 3.5–5.1)
SODIUM: 141 mmol/L (ref 135–145)

## 2015-04-17 LAB — PROTIME-INR
INR: 2.22 — ABNORMAL HIGH (ref 0.00–1.49)
Prothrombin Time: 24.4 seconds — ABNORMAL HIGH (ref 11.6–15.2)

## 2015-04-17 LAB — RETICULOCYTES
RBC.: 3.24 MIL/uL — ABNORMAL LOW (ref 4.22–5.81)
RETIC COUNT ABSOLUTE: 51.8 10*3/uL (ref 19.0–186.0)
Retic Ct Pct: 1.6 % (ref 0.4–3.1)

## 2015-04-17 LAB — HEPARIN LEVEL (UNFRACTIONATED): Heparin Unfractionated: 0.58 IU/mL (ref 0.30–0.70)

## 2015-04-17 LAB — MAGNESIUM: MAGNESIUM: 1.5 mg/dL — AB (ref 1.7–2.4)

## 2015-04-17 LAB — FERRITIN: Ferritin: 727 ng/mL — ABNORMAL HIGH (ref 24–336)

## 2015-04-17 LAB — VITAMIN B12: VITAMIN B 12: 576 pg/mL (ref 180–914)

## 2015-04-17 MED ORDER — MAGNESIUM SULFATE 2 GM/50ML IV SOLN
2.0000 g | Freq: Once | INTRAVENOUS | Status: AC
  Administered 2015-04-17: 2 g via INTRAVENOUS
  Filled 2015-04-17: qty 50

## 2015-04-17 MED ORDER — DEXTROSE 5 % IV SOLN: 2.0000 g | INTRAVENOUS | Status: DC

## 2015-04-17 MED ORDER — DILTIAZEM HCL ER COATED BEADS 120 MG PO CP24
120.0000 mg | ORAL_CAPSULE | Freq: Every day | ORAL | Status: DC
  Administered 2015-04-17 – 2015-04-19 (×3): 120 mg via ORAL
  Filled 2015-04-17 (×3): qty 1

## 2015-04-17 MED ORDER — ESCITALOPRAM OXALATE 10 MG PO TABS: 10.0000 mg | ORAL_TABLET | Freq: Every day | ORAL | Status: DC

## 2015-04-17 MED ORDER — METRONIDAZOLE 500 MG PO TABS: 500.0000 mg | ORAL_TABLET | Freq: Three times a day (TID) | ORAL | Status: DC

## 2015-04-17 MED ORDER — BACITRACIN-NEOMYCIN-POLYMYXIN OINTMENT TUBE: 1.0000 "application " | TOPICAL_OINTMENT | Freq: Every day | CUTANEOUS | Status: DC

## 2015-04-17 MED ORDER — WARFARIN SODIUM 5 MG PO TABS
5.0000 mg | ORAL_TABLET | Freq: Once | ORAL | Status: AC
Start: 2015-04-17 — End: 2015-04-17
  Administered 2015-04-17: 5 mg via ORAL
  Filled 2015-04-17: qty 1

## 2015-04-17 MED ORDER — POTASSIUM CHLORIDE CRYS ER 20 MEQ PO TBCR
40.0000 meq | EXTENDED_RELEASE_TABLET | Freq: Two times a day (BID) | ORAL | Status: DC
  Administered 2015-04-17 – 2015-04-19 (×4): 40 meq via ORAL
  Filled 2015-04-17 (×4): qty 2

## 2015-04-17 NOTE — Progress Notes (Signed)
[place] for warfarin, heparin Indication: atrial fibrillation  Allergies  Allergen Reactions  . Contrast Media [Iodinated Diagnostic Agents] Rash    a diffuse macular rash after CTA chest  Pt was premedicated with 125mg  IV Solumedrol, 50mg  IV Benadryl 1 hr prior to CTexam, and tolerated procedure without any difficulties. 11/28/11 Also same pre med protocol observed on 02/22/15 and pt tolerated the procedure well    Patient Measurements: Height: 5\' 10"  (177.8 cm) Weight: 257 lb 4.4 oz (116.7 kg) IBW/kg (Calculated) : 73 Heparin Dosing Weight: 95kg  Vital Signs: Temp: 97.9 F (36.6 C) (03/31 0758) Temp Source: Oral (03/31 0758) BP: 120/81 mmHg (03/31 0758) Pulse Rate: 99 (03/31 0758)  Labs:  Recent Labs  04/15/15 0605  04/16/15 0918 04/16/15 1821 04/17/15 1027  HGB  --   --  9.7*  --  9.5*  HCT  --   --  30.6*  --  30.6*  PLT  --   --  494*  --  526*  LABPROT 18.5*  --  21.8*  --  24.4*  INR 1.53*  --  1.91*  --  2.22*  HEPARINUNFRC  --   < > 0.26* 0.26* 0.58  CREATININE 1.45*  --  1.18  --  1.09  < > = values in this interval not displayed.  Estimated Creatinine Clearance: 107.2 mL/min (by C-G formula based on Cr of 1.09).  Assessment: 10 yom on chronic Coumadin for afib is now s/p drain placement. Warfarin had been held and reversed with vitamin K for the procedure but restarted on 3/29 along with heparin.   INR now therapeutic today at 2.22, HL remains in goal range as well- spoke with Dr. Signa Kell who is ok with stopping heparin as INR therapeutic.   H/H is low but stable, platelets remain elevated. No bleeding noted.  Of note, he was started on Flagyl which may increase the INR.  Goal of Therapy:  INR 2-3 Heparin level 0.3-0.7 units/ml Monitor platelets by anticoagulation protocol: Yes   Plan:  - Stop heparin - Warfarin 5mg  PO x 1 tonight - will likely need to reduce dose with Flagyl interaction - Daily INR  and CBC  Nai Borromeo D. Audianna Landgren, PharmD, BCPS Clinical Pharmacist Pager: [phone] 04/17/2015 12:16 PM

## 2015-04-17 NOTE — Discharge Summary (Signed)
Name: William Fischer MRN: [id number] DOB: [birth year] 50 y.o. 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  Principal Problem:   Abdominal abscess [facility]) Active Problems:   Depression with anxiety   Obesity   Atrial fibrillation ([place])   AKI (acute kidney injury) ([place])   Long term (current) use of anticoagulants [Z79.01]   Septic shock ([place])   Prediabetes   Normocytic anemia  Discharge Medications:   Medication List    STOP taking these medications        ciprofloxacin 500 MG tablet  Commonly known as:  CIPRO     diphenhydrAMINE 25 mg capsule  Commonly known as:  BENADRYL      TAKE these medications        acetaminophen 500 MG tablet  Commonly known as:  TYLENOL  Take 500-1,000 mg by mouth every 6 (six) hours as needed for mild pain or headache.     cefTRIAXone 2 g in dextrose 5 % 50 mL  Inject 2 g into the vein daily.     digoxin 0.125 MG tablet  Commonly known as:  LANOXIN  Take 1 tablet (0.125 mg total) by mouth daily.     diltiazem 120 MG 24 hr capsule  Commonly known as:  CARDIZEM CD  Take 1 capsule (120 mg total) by mouth daily.     escitalopram 10 MG tablet  Commonly known as:  LEXAPRO  Take 1 tablet (10 mg total) by mouth daily.     feeding supplement Liqd  Take 1 Container by mouth 3 (three) times daily between meals.     furosemide 40 MG tablet  Commonly known as:  LASIX  Take 1 tablet (40 mg total) by mouth daily.     lisinopril 5 MG tablet  Commonly known as:  PRINIVIL,ZESTRIL  Take 1 tablet (5 mg total) by mouth daily.     metoprolol 100 MG tablet  Commonly known as:  LOPRESSOR  Take 1 tablet (100 mg total) by mouth 2 (two) times daily.     metroNIDAZOLE 500 MG tablet  Commonly known as:   FLAGYL  Take 1 tablet (500 mg total) by mouth 3 (three) times daily.     neomycin-bacitracin-polymyxin Oint  Commonly known as:  NEOSPORIN  Apply 1 application topically daily. For abdominal wound     Oxycodone HCl 10 MG Tabs  Take 1 tablet (10 mg total) by mouth every 4 (four) hours as needed for moderate pain.     warfarin 5 MG tablet  Commonly known as:  COUMADIN  Take 1 tablet (5 mg total) by mouth daily.        Disposition and follow-up:   [name] was discharged from [facility] in Stable condition.  At the hospital follow up visit please address:  Intraabdominal abscess/colostomy: Please assess drains, colostomy, symptoms, fevers, completion of IV Ceftriaxone and po Flagyl for at least 4 weeks (04/16/15 >> 05/13/15). Needs to follow up with Surgery and Infectious Disease outpatient.  Atrial fibrillation: Rate control on Digoxin and Diltiazem. Coumadin checks for therapeutic INR.  Nonoliguric AKI: Assess urine output and renal function  Depression/Anxiety: Started on Lexapro 10 mg daily for depression and Ambien 5 mg prn for insomnia. Please address mood and depression control.   2.  Labs / imaging needed at time of follow-up: BMP, INR  3.  Pending labs/ test needing follow-up: None  Follow-up Appointments: Follow-up Information    Schedule an appointment as soon as possible for a visit with Rolm Bookbinder, MD.   Specialty:  General Surgery   Why:  If have not heard from in one week, please call the contact number.   Contact information:   [address] [place] [address] [phone]       Follow up with [facility], HEATH, MD In 1 week.   Specialties:  Interventional Radiology, Radiology   Why:  pt will hear from IR drain clinic for time and date of follow up   Contact information:   [address] [place] [address] [phone]       Follow up with Thompson Grayer, MD On 04/21/2015.   Specialty:   Cardiology   Why:  2:30 pm   Contact information:   [place] [address] [phone]       Follow up with [facility] On 04/24/2015.   Why:  hospital f/u at 2:45 pm   Contact information:   [address] [place] [id number]      Follow up with Michel Bickers, MD On 05/11/2015.   Specialty:  Infectious Diseases   Why:  at 2 pm   Contact information:   [address] [place] [address] [phone]       Follow up with [place].   Why:  [place] with RN  IV antibiotic therapy daily. RN will contact you by phone before coming out to your home tomorrow 4/3 by 12 noon. If you dont receive a call please notify the [place] office   Contact information:   [address] [place] [address] [phone]       Discharge Instructions:   Consultations: Treatment Team:  Ambrose Finland, MD  Procedures Performed:  Ct Abdomen Pelvis Wo Contrast  04/12/2015  CLINICAL DATA:  Constant ongoing abdominal pain and left flank pain, worsening over the last week. Fever, shortness of breath, and generalized weakness. EXAM: CT ABDOMEN AND PELVIS WITHOUT CONTRAST TECHNIQUE: Multidetector CT imaging of the abdomen and pelvis was performed following the standard protocol without IV contrast. COMPARISON:  03/26/2015 FINDINGS: Small left pleural effusion with consolidation in the left base possibly pneumonia. Sub cm low-attenuation lesion in the posterior right lobe of the liver similar to prior study. Gallbladder, pancreas, spleen, adrenal glands, kidneys, abdominal aorta, inferior vena cava, and retroperitoneal lymph nodes are unremarkable. There is a loculated fluid collection under the left hemidiaphragm and posterior to the spleen measuring 4.4 cm maximal diameter. This likely represents a residual or recurrent abscess. Similar collection was seen in  this location previously, slightly smaller previously. This fluid collection extends inferiorly along the posterior psoas muscle consistent with psoas abscess. A left lower quadrant percutaneous drainage catheter is placed into the left anterior pericolic gutter. There is stranding in this area without discrete residual collection. 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 An additional pigtail drainage catheter is present in the left lower quadrant without residual collection. No free air or free fluid in the abdomen. New right abdominal transverse colostomy. Stomach, small bowel, and colon are not abnormally distended. Broad-based anterior abdominal wall hernia containing  fat. Pelvis: Prostate gland is enlarged. Bladder is decompressed and cannot be evaluated. No evidence of diverticulitis. No free or loculated pelvic fluid collections. No destructive bone lesions. IMPRESSION: There is a left subdiaphragmatic abscess measuring 4.4 cm, slightly larger than on previous study. Abscess continues down along the posterior paraspinous muscles to the left psoas muscle consistent with psoas abscess. Appearance is similar to previous study. 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 Right abdominal transverse colostomy. Electronically Signed   By: Lucienne Capers M.D.   On: 04/12/2015 03:04   Ct Abdomen Pelvis W Contrast  03/26/2015  CLINICAL DATA:  History of left-sided colectomy and end colostomy for perforated ischemic colitis on 0000000 complicated by development of a rectal stump leak, post CT-guided percutaneous drainage catheter placement on 01/29/2015. Due to persistent leaking around the percutaneous drainage catheter, the patient underwent a successful fluoroscopic guided percutaneous drainage catheter repositioning on 03/02/2015 with subsequent percutaneous  drainage catheter exchange, repositioning and placements on 03/12/2015. The patient returns to the Interventional Radiology Clinic today for percutaneous drainage catheter management. The patient continues to flush all 3 existing percutaneous drainage catheters. The patient states that he is experiencing nearly 400 cc of purulent output one of the percutaneous drainage catheters per day, with bearing output from the other 2 percutaneous drainage catheters. Patient states he continues to experience a minimal amount of purulent discharge surrounding the entrance site of the 3 percutaneous drainage catheter. EXAM: CT ABDOMEN AND PELVIS WITH CONTRAST TECHNIQUE: Multidetector CT imaging of the abdomen and pelvis was performed using the standard protocol following bolus administration of intravenous contrast. CONTRAST:  119mL ISOVUE-300 IOPAMIDOL (ISOVUE-300) INJECTION 61% COMPARISON:  CT abdomen pelvis - 01/28/2015; 02/12/2015; 02/22/2015; 03/12/2015; 03/26/2015; CT-guided percutaneous drainage catheter placement - 01/29/2015; fluoroscopic guided percutaneous drainage catheter injection - 03/12/2015; 03/02/2015; fluoroscopic guided percutaneous drainage catheter injection and placement - 03/03/2015 FINDINGS: Stable sequela of left hemicolectomy with end colostomy. Grossly unchanged approximately 3.9 x 3.7 cm mesenteric fat containing peristomal hernia. Moderate colonic stool burden without evidence of enteric obstruction. Normal appearance of the terminal ileum and retrocecal appendix. No pneumoperitoneum, pneumatosis or portal venous gas. Unchanged positioning of 3 percutaneous drainage catheters all of which enter from the same entrance site within in the anterior lateral aspect of the left abdomen. There has been interval resolution of previously noted scattered loculated foci of subcutaneous emphysema about the left pericolic gutter. The approximately 3.2 x 2.0 cm peripherally enhancing fluid collection about the  cranial aspect of the tail of the spleen pancreas is grossly unchanged measuring 2.0 x 3.2 cm (coronal image 68, series 601). The peripherally enhancing fluid collection adjacent to the the lateral aspect of the left iliopsoas muscle is unchanged to minimally decreased in size in interval, currently measuring 2.7 x 5.0 x 10.1 cm (image 44, series 3; sagittal image 124, series 602) previously, 2.7 x 5.6 x 11.6 cm. The air and fluid containing collection within the left upper abdominal quadrant adjacent to the medial aspect of the spleen is grossly unchanged, measuring 3.2 x 4.4 cm (representative image 21, series 3). No new definable/drainable fluid collections within the abdomen or pelvis. ___________________________________________________________________ Normal hepatic contour. Unchanged punctate (approximately 0.7 cm) hypoattenuating lesion within the dome of the right lobe of the liver, grossly unchanged since the 12/1928 01/2014 examination while too small to adequately characterize is favored to represent a hepatic cyst. Normal appearance of the gallbladder given degree distention. No radiopaque gallstones. No intra extrahepatic  of the duct dilatation. No ascites. There is symmetric enhancement of the bilateral kidneys. No definite renal stones on this postcontrast examination. No discrete renal lesions. No urinary obstruction. Normal appearance of the bilateral adrenal glands, pancreas and spleen. Normal caliber the abdominal aorta. The major branch vessels of the abdominal aorta appear patent on this non CTA examination. Scattered retroperitoneal porta hepatis lymph nodes are individually not enlarged by size criteria. No retroperitoneal, mesenteric, pelvic or inguinal lymphadenopathy. Normal appearance of the pelvic organs. Normal appearance of the urinary bladder given degree distention. No free fluid in the pelvic cul-de-sac. Limited visualization of lower thorax demonstrates grossly unchanged small  left-sided pleural effusion with associated subsegmental atelectasis within in the left lower lobe with associated air bronchograms, unchanged. No new focal airspace opacities. Normal heart size.  No pericardial effusion. No acute or aggressive osseous abnormalities. Open midline abdominal wound, unchanged. There is a minimal on a subcutaneous edema, most conspicuous about the midline of the low back. IMPRESSION: 1. Unchanged positioning of 3 percutaneous drainage catheters with no change significant change in the complex residual fluid collections located within the left upper abdominal quadrant and about the anterior lateral aspect of the left iliopsoas muscle as well as adjacent to the medial aspect of the spleen are grossly unchanged since the 02/2015 examination. No new or enlarging intra-abdominal fluid collections. 2. Stable sequela of left colectomy and end colostomy creation without evidence of enteric obstruction. 3. Unchanged small left-sided effusion with associated left basilar opacities and air bronchograms, atelectasis versus infiltrate. Electronically Signed   By: Sandi Mariscal M.D.   On: 03/26/2015 12:44   Dg Small Bowel  04/06/2015  CLINICAL DATA:  50 year old male status post left-sided colectomy and end colostomy for perforated ischemic colitis on 0000000 complicated by development of a rectal stump leak, post CT-guided percutaneous drainage catheter placement on 01/29/2015. Due to persistent leaking around the percutaneous drainage catheter, the patient underwent a successful fluoroscopic guided percutaneous drainage catheter repositioning on 03/02/2015 with subsequent percutaneous drainage catheter exchange, repositioning and placements on 03/12/2015. Patient presents for small bowel follow-through today trying to ascertain the source of continued high volume drain output. Recent drain injection on 03/26/2015. EXAM: SMALL BOWEL SERIES COMPARISON:  03/26/2015 and earlier. TECHNIQUE:  Following ingestion of thin barium, serial small bowel images were obtained including spot views of the terminal ileum. FLUOROSCOPY TIME:  Radiation Exposure Index (as provided by the fluoroscopic device): 75 dGy If the device does not provide the exposure index: Fluoroscopy Time (in minutes and seconds):  1 minutes 0 seconds FINDINGS: Preprocedural scout view of the abdomen demonstrating 3 pigtail type percutaneous drains in the left mid and lower abdomen stable in configuration since 03/26/2015. Normal bowel gas pattern. Superimposed midline catheter which pertains to an external wound VAC. Single contrast small bowel follow-through was undertaken and the patient tolerated this well and without difficulty. After the first cup of barium contrast a KUB is obtained demonstrating normal opacification of the stomach and proximal small bowel loops. Normal midgut rotation. Oral contrast has reached left abdominal small bowel in proximity to the abdominal drains no contrast identified within the drains. The patient was then followed with serial overhead radiographs while he drank 2 more cups of oral contrast. Contrast transit time to the residual colon is about 1 hour. On all of these images no barium uptake by the drains is identified. Fluoroscopic evaluation of the small bowel and left abdominal drains was then performed. The loops in proximity to the drains  appear normal in course caliber and mucosal pattern. No contrast uptake by the drains occurred. The terminal ileum also is normal. IMPRESSION: Negative small bowel follow-through. Small bowel loops in proximity to the left abdominal drains appear normal with no evidence of enteric fistula. Electronically Signed   By: Genevie Ann M.D.   On: 04/06/2015 11:37   Dg Sinus/fist Tube Chk-non Gi  03/26/2015  CLINICAL DATA:  History of left-sided colectomy and end colostomy for perforated ischemic colitis on 0000000 complicated by development of a rectal stump leak, post  CT-guided percutaneous drainage catheter placement on 01/29/2015. Due to persistent leaking around the percutaneous drainage catheter, the patient underwent a successful fluoroscopic guided percutaneous drainage catheter repositioning on 03/02/2015 with subsequent percutaneous drainage catheter exchange, repositioning and placements on 03/12/2015. The patient returns to the Interventional Radiology Clinic today for percutaneous drainage catheter management. The patient continues to flush all 3 existing percutaneous drainage catheters. The patient states that he is experiencing nearly 400 cc of purulent output one of the percutaneous drainage catheters per day, with bearing output from the other 2 percutaneous drainage catheters. Patient states he continues to experience a minimal amount of purulent discharge surrounding the entrance site of the 3 percutaneous drainage catheter. EXAM: ABSCESS INJECTION COMPARISON:  CT abdomen pelvis - earlier same day ; 01/17/2015; fluoroscopic guided percutaneous drainage catheter exchange - 03/02/2015; fluoroscopic guided percutaneous drainage catheter exchange, repositioning and placement - 03/03/2015; fluoroscopic guided percutaneous drainage catheter injection - 03/11/2014 CONTRAST:  20 cc Omnipaque 300, administered via the existing percutaneous drainage catheter FLUOROSCOPY TIME:  30 seconds (45 dGy) TECHNIQUE: The patient was positioned supine on the fluoroscopy table. A preprocedural spot fluoroscopic image was obtained of the left mid hemi abdomen existing percutaneous drainage catheters. Several spot fluoroscopic and radiographic images were obtained from the injection of a small amount of contrast via the middle percutaneous drainage catheter (the biliary drainage catheter) as multiple spot fluoroscopic and radiographic images were obtained. Images were reviewed and the procedure was terminated. The drainage catheters reconnected to a gravity bag. A dressing was placed.  The patient tolerated the procedure well without immediate postprocedural complication. FINDINGS: Fluoroscopic guided injection of the middle percutaneous drainage catheter (the biliary drainage catheter) demonstrates opacification of the known abscess within the left upper abdominal quadrant adjacent to the medial aspect of the spleen. There is minimal opacification along the mid in caudal aspects of the drainage catheter without definitive opacification of the node fluid collection about the left iliopsoas musculature. Excreted contrast is seen within in the left renal collecting system, ureter or urinary bladder. IMPRESSION: Fluoroscopic guided injection of the middle percutaneous drainage catheter (the biliary drainage catheter) demonstrates communication with the cranial aspect of the drain and the known residual abscess within the left upper abdominal quadrant adjacent to the medial aspect of the spleen, grossly unchanged compared to fluoroscopic guided injection performed 03/12/2015. There is no definitive opacification of the known abscess/fluid collection adjacent left iliopsoas musculature. Electronically Signed   By: Sandi Mariscal M.D.   On: 03/26/2015 12:52   Dg Chest Port 1 View  04/16/2015  CLINICAL DATA:  Status post right PICC placement today. Multiple intra-abdominal abscesses. Initial encounter. EXAM: PORTABLE CHEST 1 VIEW COMPARISON:  Single view of the chest 04/12/2015. FINDINGS: A new right PICC is in place with the tip in the lower superior vena cava. Right IJ catheter is again seen. There is left worse than right basilar airspace disease and likely a small left effusion, unchanged. No pneumothorax.  Cardiomegaly and vascular congestion noted. IMPRESSION: Tip of right PICC projects in the lower superior vena cava. No change in left worse than right basilar airspace disease and small left effusion. Cardiomegaly and vascular congestion. Electronically Signed   By: Inge Rise M.D.   On:  04/16/2015 14:12   Dg Chest Portable 1 View  04/12/2015  CLINICAL DATA:  Central line placement EXAM: PORTABLE CHEST 1 VIEW COMPARISON:  04/12/2015. FINDINGS: RIGHT IJ catheter tip cavoatrial junction. No pneumothorax. Cardiomegaly. Developing edema. IMPRESSION: RIGHT IJ catheter tip cavoatrial junction. No pneumothorax. Worsening aeration. Electronically Signed   By: Staci Righter M.D.   On: 04/12/2015 07:03   Dg Chest Port 1 View  04/12/2015  CLINICAL DATA:  Fever. EXAM: PORTABLE CHEST 1 VIEW COMPARISON:  01/29/2015 FINDINGS: Cardiac enlargement without vascular congestion or edema. Slight atelectasis in the left lung base. Improvement of the lung bases since previous study. Can't exclude small left pleural effusion. No pneumothorax. Mediastinal contours appear intact. IMPRESSION: Cardiac enlargement. Atelectasis and possible small effusion on the left base. Improvement since previous study. Electronically Signed   By: Lucienne Capers M.D.   On: 04/12/2015 00:40   Ct Image Guided Fluid Drain By Catheter  04/15/2015  INDICATION: 50 year old male with a history of ischemic colitis status post bowel resection and colostomy complicated by multiple intra-abdominal abscesses. He has undergone placement of multiple percutaneous drainage catheters previously. Most recent CT imaging which was performed for persistent abdominal pain and intermittent low-grade fevers demonstrates a residual un treated subdiaphragmatic abscess. This abscess appears to communicate via a thin channel with residual fluid tracking along the anterior aspect of the left psoas muscle. There was an abscess in this location on prior imaging. He presents today for drainage of these left retroperitoneal psoas and subdiaphragmatic collections. EXAM: CT IMAGE GUIDED FLUID DRAIN BY CATHETER MEDICATIONS: The patient is currently admitted to the hospital and receiving intravenous antibiotics. The antibiotics were administered within an appropriate  time frame prior to the initiation of the procedure. ANESTHESIA/SEDATION: Fentanyl 150 mcg IV; Versed Three mg IV Moderate Sedation Time:  40 The patient was continuously monitored during the procedure by the interventional radiology nurse under my direct supervision. COMPLICATIONS: None immediate. PROCEDURE: Informed written consent was obtained from the patient after a thorough discussion of the procedural risks, benefits and alternatives. All questions were addressed. Maximal Sterile Barrier Technique was utilized including caps, mask, sterile gowns, sterile gloves, sterile drape, hand hygiene and skin antiseptic. A timeout was performed prior to the initiation of the procedure. A planning axial CT scan was performed. The fluid collection anterior to the left psoas muscle was successfully identified. A suitable skin entry site was selected and marked. The region was sterilely prepped and draped in standard fashion using chlorhexidine skin prep. Local anesthesia was attained by infiltration with 1% lidocaine. Using intermittent CT imaging, a 15 cm 18 gauge trocar needle was carefully advanced through the left lower quadrant abdominal wall and into the psoas fluid collection. A wire was then carefully manipulated in until it was successfully passed cephalad into the subdiaphragmatic collection. The skin tract was then serially dilated to 14 [place]. A Cook 32 [place] biliary drainage catheter was then advanced over the wire. Aspiration yields approximately 200 mL foul-smelling purulent fluid. Axial CT imaging demonstrates excellent position of the drainage catheter and near complete collapse of the residual abscess cavity. The catheter was connected to JP bulb suction and secured to the skin with 0 Prolene suture. IMPRESSION: Successful placement of  a 56 [place] biliary drainage catheter so that it extends from the left psoas abscess cephalad into the left subdiaphragmatic abscess. Aspiration yields 200 mL  foul-smelling purulent fluid. A sample was sent for culture. Signed, Criselda Peaches, MD [facility] Radiology Electronically Signed   By: Jacqulynn Cadet M.D.   On: 04/15/2015 09:54    2D Echo: TTE 01/16/2015 - Left ventricle: EF hard to judge due to rapid afib. Poor image  quality abnormal septal motion The cavity size was normal. Wall  thickness was normal. Systolic function was mildly reduced. The  estimated ejection fraction was in the range of 45% to 50%. - Mitral valve: There was mild regurgitation. - Left atrium: The atrium was moderately dilated. - Atrial septum: No defect or patent foramen ovale was identified. - Pericardium, extracardiac: Small lateral pericardial effusion - Impressions: Consider TEE if clinically indicated. ? lipomatous  hypertrophy of atrial septum cannot r/o mass in RA but suspect  this is off axis artifact.  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 He was in septic shock with non-oliguric AKI (Cr 4.09) and required ICU admission for pressor support which he required for one day as well as stress steroids. He also required amiodarone drip for uncontrolled afib. He had biliary drain placed into the left psoas abscess and subdiaphragmatic abscess with 200 mL purulent drainage on 3/28. He is now on heparin and coumadin for  bridging for afib. He was on vancomycin, zosyn, and eraxis but now on ceftriaxone and PO flagyl. His cultures are pending with no growth to date. He has GPC on gram stain. He will likely need IV antibiotics on discharge per ID for 4 weeks depending on drainage. He was seen by psychiatry for depression/anxiety and started on lexapro.   He had perforated ischemic colitis (initially thought to be due to perforated diverticulitis) requiring left colectomy and end colostomy while on pradaxa on December 31st 2016 after he presented with 1 week history of LLQ abdominal pain. CT imaging on 01/28/15 revealed a rectal stump blowout with subsequent pelvic abscess development which he had drained on 01/29/15. Cultures revealed E. Coli and he was discharged on 10-day course of cefdinir. On 03/02/15 his left abdominal drain was exchanged secondary to partial dislodgment. Due to persistent leakage around the drainage catheter he had conversion of the existing drain to 2 biliary drainage catheters in addition to APD on 03/03/15. He had small bowel follow through a week before he was admitted that did no show small bowel leak. He was on PO ciprofloxacin from 02/24/15 which was stooped 4? weeks prior to [place]. At follow-up appointment on 03/27/15 he was still taking ciprofloxacin. He follows with surgeon Dr. Donne Hazel.   This morning he reports he is doing well but had LLQ pain and inability to void requiring foley catheter placement. He is requiring fentanyl regularly. He has been able to tolerate regular diet.   Hospital Course by problem list: Principal Problem:   Abdominal  abscess [facility]) Active Problems:   Depression with anxiety   Obesity   Atrial fibrillation ([place])   AKI (acute kidney injury) ([place])   Long term (current) use of anticoagulants [Z79.01]   Septic shock (HCC)   Prediabetes   Normocytic anemia   Recurrent intraabdominal abscess in setting of perforated ischemic colitis s/p left colectomy and end  colostomy complicated by septic shock - Biliary drain placed into the left psoas abscess and subdiaphragmatic abscess with 200 mL purulent drainage on 3/28.Gram stain with GPC. Initally on Vancomycin, Zosyn, and Anidulafungin for 4-5 days.   Atrial Fibrillation - Required amiodarone drip for uncontrolled afib. Was transitioned to oral Digoxin and Diltiazem with good rate control. Was started on heparin and coumadin for bridging for afib. Heparin was discontinued when INR therapeutic. He was discharged after monitoring for therapeutic INR over 72 hours, and at discharge, INR was 2.3 on coumadin 5mg .  Nonoliguric AKI - Baseline near 0.7-0.9, was 4.09 on admission with normal urine output. Etiology most likely ischemic ATN in setting of septic shock on admission. Required ICU admission for pressor support which he required for one day as well as stress steroids. Serum creatinine gradually improved to normal range prior to discharge.  Depression and anxiety - Patient depressed when in the ICU. Psychiatry was consulted and recommended medical management. Lexapro 10 mg daily was added. Ambien 5 mg PRN bedtime for insomnia was also added.  Discharge Vitals:   BP 120/82 mmHg  Pulse 86  Temp(Src) 98.3 F (36.8 C) (Oral)  Resp 20  Ht 5\' 10"  (1.778 m)  Wt 256 lb 13.4 oz (116.5 kg)  BMI 36.85 kg/m2  SpO2 96%  Discharge Labs:  Results for orders placed or performed during the hospital encounter of 04/12/15 (from the past 24 hour(s))  Glucose, capillary     Status: Abnormal   Collection Time: 04/18/15  5:19 PM  Result Value Ref Range   Glucose-Capillary 117 (H) 65 - 99 mg/dL  Glucose, capillary     Status: Abnormal   Collection Time: 04/18/15  9:39 PM  Result Value Ref Range   Glucose-Capillary 123 (H) 65 - 99 mg/dL  CBC     Status: Abnormal   Collection Time: 04/19/15  4:53 AM  Result Value Ref Range   WBC 12.8 (H) 4.0 - 10.5 K/uL   RBC 3.21 (L) 4.22 - 5.81 MIL/uL   Hemoglobin 9.3 (L) 13.0 -  17.0 g/dL   HCT 30.4 (L) 39.0 - 52.0 %   MCV 94.7 78.0 - 100.0 fL   MCH 29.0 26.0 - 34.0 pg   MCHC 30.6 30.0 - 36.0 g/dL   RDW 16.8 (H) 11.5 - 15.5 %   Platelets 467 (H) 150 - 400 K/uL  Protime-INR     Status: Abnormal   Collection Time: 04/19/15  4:53 AM  Result Value Ref Range   Prothrombin Time 25.0 (H) 11.6 - 15.2 seconds   INR 2.29 (H) 0.00 - 1.49  Glucose, capillary     Status: None   Collection Time: 04/19/15  8:07 AM  Result Value Ref Range   Glucose-Capillary 90 65 - 99 mg/dL  Glucose, capillary     Status: Abnormal   Collection Time: 04/19/15 12:01 PM  Result Value Ref Range   Glucose-Capillary 142 (H) 65 - 99 mg/dL    Signed: Riccardo Dubin, MD 04/21/2015, 1:33 PM    Services Ordered on Discharge: [place] Ordered on Discharge: none

## 2015-04-17 NOTE — Discharge Instructions (Signed)
Wash wounds daily in shower with soap and water. Do not soak. Apply antibiotic ointment (e.g. Neosporin) twice daily and as needed to keep moist. Cover with dry dressing. ---------------------------------------------------------------------------------------------------------------------------------------------  Information on my medicine - Coumadin   (Warfarin)  This medication education was reviewed with me or my healthcare representative as part of my discharge preparation.    Why was Coumadin prescribed for you? Coumadin was prescribed for you because you have a blood clot or a medical condition that can cause an increased risk of forming blood clots. Blood clots can cause serious health problems by blocking the flow of blood to the heart, lung, or brain. Coumadin can prevent harmful blood clots from forming. As a reminder your indication for Coumadin is:   Deep Vein Thrombosis Treatment  What test will check on my response to Coumadin? While on Coumadin (warfarin) you will need to have an INR test regularly to ensure that your dose is keeping you in the desired range. The INR (international normalized ratio) number is calculated from the result of the laboratory test called prothrombin time (PT).  If an INR APPOINTMENT HAS NOT ALREADY BEEN MADE FOR YOU please schedule an appointment to have this lab work done by your health care provider within 7 days. Your INR goal is usually a number between:  2 to 3 or your provider may give you a more narrow range like 2-2.5.  Ask your health care provider during an office visit what your goal INR is.  What  do you need to  know  About  COUMADIN? Take Coumadin (warfarin) exactly as prescribed by your healthcare provider about the same time each day.  DO NOT stop taking without talking to the doctor who prescribed the medication.  Stopping without other blood clot prevention medication to take the place of Coumadin may increase your risk of developing a  new clot or stroke.  Get refills before you run out.  What do you do if you miss a dose? If you miss a dose, take it as soon as you remember on the same day then continue your regularly scheduled regimen the next day.  Do not take two doses of Coumadin at the same time.  Important Safety Information A possible side effect of Coumadin (Warfarin) is an increased risk of bleeding. You should call your healthcare provider right away if you experience any of the following: ? Bleeding from an injury or your nose that does not stop. ? Unusual colored urine (red or dark brown) or unusual colored stools (red or black). ? Unusual bruising for unknown reasons. ? A serious fall or if you hit your head (even if there is no bleeding).  Some foods or medicines interact with Coumadin (warfarin) and might alter your response to warfarin. To help avoid this: ? Eat a balanced diet, maintaining a consistent amount of Vitamin K. ? Notify your provider about major diet changes you plan to make. ? Avoid alcohol or limit your intake to 1 drink for women and 2 drinks for men per day. (1 drink is 5 oz. wine, 12 oz. beer, or 1.5 oz. liquor.)  Make sure that ANY health care provider who prescribes medication for you knows that you are taking Coumadin (warfarin).  Also make sure the healthcare provider who is monitoring your Coumadin knows when you have started a new medication including herbals and non-prescription products.  Coumadin (Warfarin)  Major Drug Interactions  Increased Warfarin Effect Decreased Warfarin Effect  Alcohol (large quantities) Antibiotics (  esp. Septra/Bactrim, Flagyl, Cipro) Amiodarone (Cordarone) Aspirin (ASA) Cimetidine (Tagamet) Megestrol (Megace) NSAIDs (ibuprofen, naproxen, etc.) Piroxicam (Feldene) Propafenone (Rythmol SR) Propranolol (Inderal) Isoniazid (INH) Posaconazole (Noxafil) Barbiturates (Phenobarbital) Carbamazepine (Tegretol) Chlordiazepoxide (Librium) Cholestyramine  (Questran) Griseofulvin Oral Contraceptives Rifampin Sucralfate (Carafate) Vitamin K   Coumadin (Warfarin) Major Herbal Interactions  Increased Warfarin Effect Decreased Warfarin Effect  Garlic Ginseng Ginkgo biloba Coenzyme Q10 Green tea St. Johns wort    Coumadin (Warfarin) FOOD Interactions  Eat a consistent number of servings per week of foods HIGH in Vitamin K (1 serving =  cup)  Collards (cooked, or boiled & drained) Kale (cooked, or boiled & drained) Mustard greens (cooked, or boiled & drained) Parsley *serving size only =  cup Spinach (cooked, or boiled & drained) Swiss chard (cooked, or boiled & drained) Turnip greens (cooked, or boiled & drained)  Eat a consistent number of servings per week of foods MEDIUM-HIGH in Vitamin K (1 serving = 1 cup)  Asparagus (cooked, or boiled & drained) Broccoli (cooked, boiled & drained, or raw & chopped) Brussel sprouts (cooked, or boiled & drained) *serving size only =  cup Lettuce, raw (green leaf, endive, romaine) Spinach, raw Turnip greens, raw & chopped   These websites have more information on Coumadin (warfarin):  FailFactory.se; VeganReport.com.au;

## 2015-04-17 NOTE — Progress Notes (Signed)
Patient ID: William Fischer, male   DOB: 08-Nov-1965, 50 y.o.   MRN: [id number] Medicine attending: I examined this patient this morning together with resident physician Dr. Zada Finders and I concur with his evaluation and management plan which we discussed together. He is afebrile currently on Rocephin plus oral Flagyl. Minimal drainage. Functioning colostomy. Chronic atrial fibrillation with irregularly irregular heart rhythm but rate currently controlled. A PICC catheter was placed in the right arm yesterday. He was started back on warfarin 2 days ago with initial dose 7.5 mg, second dose 5 mg. Home dose was 5 mg daily. INR today 2.2. We will stop heparin. Continue warfarin. 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 Once we have reached adequate anticoagulation, we will continue his antibiotic course as an outpatient. He will need follow-up with surgery for his drains. Renal function now back to normal and low-dose digoxin has been resumed. He is now normotensive so we need to add back his Cardizem to control his heart rate.

## 2015-04-17 NOTE — Progress Notes (Signed)
PT Discharge Note  Patient Details Name: William Fischer MRN: [id number] DOB: [birth year]   Cancelled Treatment:    Reason Eval/Treat Not Completed: PT screened, no needs identified, will sign off  Spoke with occupational therapy after initial evaluation. OT reports patient is functioning at a high level of independence and no physical therapy is indicated at this time. PT is signing-off. Please re-order if there is any significant change in status. Thank you for this referral.    Ellouise Newer 04/17/2015, 10:13 AM       Elayne Snare, [place]

## 2015-04-17 NOTE — Progress Notes (Signed)
Referring Physician(s): Dr Hulen Skains  Supervising Physician: Daryll Brod  Chief Complaint:  Psoas abscess Subdiaphragmatic abscess Post ischemic colitis---bowel resection   Subjective:  Better again today Up in bed States drains are draining well and new one is sl tender but fine  Allergies: Contrast media  Medications: Prior to Admission medications   Medication Sig Start Date End Date Taking? Authorizing Provider  acetaminophen (TYLENOL) 500 MG tablet Take 500-1,000 mg by mouth every 6 (six) hours as needed for mild pain or headache.   Yes Historical Provider, MD  digoxin (LANOXIN) 0.125 MG tablet Take 1 tablet (0.125 mg total) by mouth daily. 03/23/15  Yes Sherran Needs, NP  diltiazem (CARDIZEM CD) 120 MG 24 hr capsule Take 1 capsule (120 mg total) by mouth daily. 03/23/15  Yes Sherran Needs, NP  diphenhydrAMINE (BENADRYL) 25 mg capsule Take 2 capsules (50 mg total) by mouth once. Patient taking differently: Take 50 mg by mouth as needed (for imaging scanes).  03/17/15  Yes Aletta Edouard, MD  feeding supplement (BOOST / RESOURCE BREEZE) LIQD Take 1 Container by mouth 3 (three) times daily between meals. 02/02/15  Yes Liberty Handy, MD  furosemide (LASIX) 40 MG tablet Take 1 tablet (40 mg total) by mouth daily. 03/23/15  Yes Sherran Needs, NP  lisinopril (PRINIVIL,ZESTRIL) 5 MG tablet Take 1 tablet (5 mg total) by mouth daily. 02/25/15  Yes Thompson Grayer, MD  Metoprolol Tartrate 75 MG TABS Take 150 mg by mouth 2 (two) times daily. 03/12/15  Yes Historical Provider, MD  oxyCODONE (OXY IR/ROXICODONE) 5 MG immediate release tablet Take 1-2 tablets (5-10 mg total) by mouth every 4 (four) hours as needed for moderate pain. 02/24/15  Yes Emina Riebock, NP  warfarin (COUMADIN) 5 MG tablet Take as directed by [facility] Patient taking differently: Take 5 mg by mouth daily.  03/09/15  Yes Thompson Grayer, MD  ciprofloxacin (CIPRO) 500 MG tablet Take 1 tablet (500 mg total) by mouth 2 (two)  times daily. Patient not taking: Reported on 04/12/2015 02/24/15   Erby Pian, NP  metoprolol (LOPRESSOR) 100 MG tablet Take 1 tablet (100 mg total) by mouth 2 (two) times daily. Patient not taking: Reported on 04/12/2015 03/23/15   Sherran Needs, NP     Vital Signs: BP 120/81 mmHg  Pulse 99  Temp(Src) 97.9 F (36.6 C) (Oral)  Resp 17  Ht 5\' 10"  (1.778 m)  Wt 257 lb 4.4 oz (116.7 kg)  BMI 36.92 kg/m2  SpO2 96%  Physical Exam  Abdominal: Soft. Bowel sounds are normal.  Skin: Skin is warm and dry.  Sites of all drain s are clean and dry No bleeding Sl tender No infection  Output remains milky yellow --previous 3 LLQ drains Reddish serous fluid most recent more superior drain  NO growth  Nursing note and vitals reviewed.   Imaging: Dg Chest Port 1 View  04/16/2015  CLINICAL DATA:  Status post right PICC placement today. Multiple intra-abdominal abscesses. Initial encounter. EXAM: PORTABLE CHEST 1 VIEW COMPARISON:  Single view of the chest 04/12/2015. FINDINGS: A new right PICC is in place with the tip in the lower superior vena cava. Right IJ catheter is again seen. There is left worse than right basilar airspace disease and likely a small left effusion, unchanged. No pneumothorax. Cardiomegaly and vascular congestion noted. IMPRESSION: Tip of right PICC projects in the lower superior vena cava. No change in left worse than right basilar airspace disease and small left effusion.  Cardiomegaly and vascular congestion. Electronically Signed   By: Inge Rise M.D.   On: 04/16/2015 14:12   Ct Image Guided Fluid Drain By Catheter  04/15/2015  INDICATION: 50 year old male with a history of ischemic colitis status post bowel resection and colostomy complicated by multiple intra-abdominal abscesses. He has undergone placement of multiple percutaneous drainage catheters previously. Most recent CT imaging which was performed for persistent abdominal pain and intermittent low-grade fevers  demonstrates a residual un treated subdiaphragmatic abscess. This abscess appears to communicate via a thin channel with residual fluid tracking along the anterior aspect of the left psoas muscle. There was an abscess in this location on prior imaging. He presents today for drainage of these left retroperitoneal psoas and subdiaphragmatic collections. EXAM: CT IMAGE GUIDED FLUID DRAIN BY CATHETER MEDICATIONS: The patient is currently admitted to the hospital and receiving intravenous antibiotics. The antibiotics were administered within an appropriate time frame prior to the initiation of the procedure. ANESTHESIA/SEDATION: Fentanyl 150 mcg IV; Versed Three mg IV Moderate Sedation Time:  40 The patient was continuously monitored during the procedure by the interventional radiology nurse under my direct supervision. COMPLICATIONS: None immediate. PROCEDURE: Informed written consent was obtained from the patient after a thorough discussion of the procedural risks, benefits and alternatives. All questions were addressed. Maximal Sterile Barrier Technique was utilized including caps, mask, sterile gowns, sterile gloves, sterile drape, hand hygiene and skin antiseptic. A timeout was performed prior to the initiation of the procedure. A planning axial CT scan was performed. The fluid collection anterior to the left psoas muscle was successfully identified. A suitable skin entry site was selected and marked. The region was sterilely prepped and draped in standard fashion using chlorhexidine skin prep. Local anesthesia was attained by infiltration with 1% lidocaine. Using intermittent CT imaging, a 15 cm 18 gauge trocar needle was carefully advanced through the left lower quadrant abdominal wall and into the psoas fluid collection. A wire was then carefully manipulated in until it was successfully passed cephalad into the subdiaphragmatic collection. The skin tract was then serially dilated to 14 [place]. A Cook 83 [place]  biliary drainage catheter was then advanced over the wire. Aspiration yields approximately 200 mL foul-smelling purulent fluid. Axial CT imaging demonstrates excellent position of the drainage catheter and near complete collapse of the residual abscess cavity. The catheter was connected to JP bulb suction and secured to the skin with 0 Prolene suture. IMPRESSION: Successful placement of a 59 French biliary drainage catheter so that it extends from the left psoas abscess cephalad into the left subdiaphragmatic abscess. Aspiration yields 200 mL foul-smelling purulent fluid. A sample was sent for culture. Signed, Criselda Peaches, MD [facility] Radiology Electronically Signed   By: Jacqulynn Cadet M.D.   On: 04/15/2015 09:54    Labs:  CBC:  Recent Labs  04/13/15 0509 04/14/15 0504 04/16/15 0918 04/17/15 1027  WBC 11.4* 10.4 12.3* 11.9*  HGB 9.3* 8.9* 9.7* 9.5*  HCT 30.3* 29.8* 30.6* 30.6*  PLT 561* 546* 494* 526*    COAGS:  Recent Labs  04/13/15 1730 04/14/15 0807 04/15/15 0605 04/16/15 0918 04/17/15 1027  INR 2.62* 1.57* 1.53* 1.91* 2.22*  APTT 49*  --   --   --   --     BMP:  Recent Labs  04/13/15 0509 04/14/15 0504 04/15/15 0605 04/16/15 0918  NA 142 142 140 141  K 4.4 4.2 3.7 3.2*  CL 104 106 104 105  CO2 28  25 28 29   GLUCOSE 111* 137* 137* 119*  BUN 26* 25* 21* 15  CALCIUM 9.2 9.6 9.3 9.0  CREATININE 2.10* 1.96* 1.45* 1.18  GFRNONAA 36* 39* 56* >60  GFRAA 42* 45* >60 >60    LIVER FUNCTION TESTS:  Recent Labs  04/11/15 2358 04/12/15 1430 04/14/15 0504 04/15/15 0605  BILITOT 0.4 0.3 0.6 0.4  AST 22 14* 13* 13*  ALT 44 31 23 18   ALKPHOS 86 68 59 55  PROT 8.4* 7.1 7.1 6.6  ALBUMIN 2.9* 2.3* 2.5* 2.3*    Assessment and Plan:  Ischemic colitis with bowel resection and colostomy Post surgical abscesses Drain in place 3 in LLQ Newest drain Left psoas/subdiaphragmatic 3/28 Will follow Will see  pt in IR drain clinic as OP 7-10 days---he will hear from scheduler for time and date  Electronically Signed: Izella Ybanez A 04/17/2015, 11:28 AM   I spent a total of 15 Minutes at the the patient's bedside AND on the patient's hospital floor or unit, greater than 50% of which was counseling/coordinating care for abscess drains

## 2015-04-17 NOTE — Progress Notes (Signed)
Subjective: Patient feels well today, in good spirits. He worked with PT/OT very well today. He is encouraged by this and feels ready to go home tomorrow. He has advanced home care to help him on discharge. INR is therapeutic today and we are monitoring over 24 hours to see that it remains this way. Patient was on a home dose of Warfarin at 5 mg.  Objective: Vital signs in last 24 hours: Filed Vitals:   04/16/15 2100 04/17/15 0513 04/17/15 0758 04/17/15 1713  BP: 132/84 131/77 120/81 127/80  Pulse: 88 90 99 106  Temp: 97.5 F (36.4 C) 98.2 F (36.8 C) 97.9 F (36.6 C) 98.5 F (36.9 C)  TempSrc: Oral Oral Oral Oral  Resp: 18 16 17 18   Height:      Weight:  257 lb 4.4 oz (116.7 kg)    SpO2: 97% 96% 96% 96%   Weight change: 2 lb 3.3 oz (1 kg)  Intake/Output Summary (Last 24 hours) at 04/17/15 1722 Last data filed at 04/17/15 1400  Gross per 24 hour  Intake 1539.57 ml  Output   1850 ml  Net -310.43 ml   General: resting in bed, no acute distress Cardiac: irregularly irregular, rate controlled Pulm: clear to auscultation bilaterally Abd: colostomy on right, three drains LLQ, one drain LUQ Neuro: alert and oriented X3   Assessment/Plan: Principal Problem:   Abdominal abscess ([place]) Active Problems:   Depression with anxiety   Obesity   Atrial fibrillation (HCC)   AKI (acute kidney injury) ([place])   Long term (current) use of anticoagulants [Z79.01]   Septic shock (HCC)   Prediabetes   Normocytic anemia   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 He had biliary drain placed into the left psoas abscess and subdiaphragmatic abscess with 200 mL purulent drainage on 3/28. He is tolerating regular diet and is afebrile. Stable from surgery standpoint for discharge with outpatient follow up. -Appreciate surgery and ID following  -IV  ceftriaxone 2 g daily and PO flagyl for 4 week course per ID (depending on drainage) -PICC line placed for long-term IV antibiotic therapy  -Follow-up abscess culture and blood cultures  -home oxy IR 5-10 mg Q 4 hr PRN pain -d/c fentanyl 25 mcg q4 hr PRN severe pain -PT/OT report high level of independence  Atrial Fibrillation - Currently rate controlled with irregularly irregular rhythm. INR today 2.22.  -d/c IV heparin -coumadin 5 mg -Daily INR with goal 2-3 for 24 hrs -Continue digoxin 0.125 mg daily  -Add back home Diltiazem 120 mg qd  Nonoliguric AKI - Cr 1.09 almost back to baseline near 0.7-0.9 from 4.09 from admission with normal urine output. Etiology most likely ischemic ATN in setting of septic shock on admission.  -Appreciate nephrology following -Monitor BMP -Monitor daily weights and strict I & O's -Avoid nephrotoxins   Chronic normocytic anemia - Hg 9.5 with no active bleeding. On AC therapy since January of 2017. Etiology most likely due to chronic illness vs IDA from malabsorption.  -Monitor CBC  Depression and anxiety - Currently with stable mood.  -Appreciate psychiatry following -Continue lexapro 10 mg daily  -Continue ambien 5 mg PRN bedtime for insomnia   Prediabetes - Last A1c 6.1 on 01/15/15. -Continue moderate SSI before meals and at bedtime  -Encourage lifestyle modification    Dispo: Disposition is deferred at this time, awaiting improvement of current medical  problems.  Anticipated discharge in approximately 1 day(s).     LOS: 5 days   Zada Finders, MD 04/17/2015, 5:22 PM

## 2015-04-17 NOTE — Progress Notes (Signed)
[facility]   Reason for visit: Follow up on intraabdominal abscess  Interval History: GPC on gram stain, culture remains negative.  WBC 11.9  Physical Exam: Constitutional:  Filed Vitals:   04/17/15 0513 04/17/15 0758  BP: 131/77 120/81  Pulse: 90 99  Temp: 98.2 F (36.8 C) 97.9 F (36.6 C)  Resp: 16 17   patient appears in NAD Respiratory: Normal respiratory effort; CTA B Cardiovascular: RRR GI: left drain X3 and 1 more superior  Review of Systems: Constitutional: negative for fevers and chills Gastrointestinal: negative for nausea and diarrhea  Lab Results  Component Value Date   WBC 11.9* 04/17/2015   HGB 9.5* 04/17/2015   HCT 30.6* 04/17/2015   MCV 94.4 04/17/2015   PLT 526* 04/17/2015    Lab Results  Component Value Date   CREATININE 1.09 04/17/2015   BUN 12 04/17/2015   NA 141 04/17/2015   K 3.3* 04/17/2015   CL 105 04/17/2015   CO2 28 04/17/2015    Lab Results  Component Value Date   ALT 18 04/15/2015   AST 13* 04/15/2015   ALKPHOS 55 04/15/2015     Microbiology: Recent Results (from the past 240 hour(s))  Blood Culture (routine x 2)     Status: None   Collection Time: 04/11/15 11:58 PM  Result Value Ref Range Status   Specimen Description BLOOD LEFT ARM  Final   Special Requests BOTTLES DRAWN AEROBIC AND ANAEROBIC 5CC  Final   Culture NO GROWTH 5 DAYS  Final   Report Status 04/17/2015 FINAL  Final  Blood Culture (routine x 2)     Status: None   Collection Time: 04/12/15 12:03 AM  Result Value Ref Range Status   Specimen Description BLOOD LEFT ARM  Final   Special Requests BOTTLES DRAWN AEROBIC AND ANAEROBIC 5CC  Final   Culture NO GROWTH 5 DAYS  Final   Report Status 04/17/2015 FINAL  Final  Urine culture     Status: None   Collection Time: 04/12/15  1:09 AM  Result Value Ref Range Status   Specimen Description URINE, CLEAN CATCH  Final   Special Requests NONE  Final   Culture MULTIPLE SPECIES PRESENT,  SUGGEST RECOLLECTION  Final   Report Status 04/13/2015 FINAL  Final  C difficile quick scan w PCR reflex     Status: None   Collection Time: 04/12/15  7:17 AM  Result Value Ref Range Status   C Diff antigen NEGATIVE NEGATIVE Final   C Diff toxin NEGATIVE NEGATIVE Final   C Diff interpretation Negative for toxigenic C. difficile  Final  Gastrointestinal Panel by PCR , Stool     Status: None   Collection Time: 04/12/15  7:17 AM  Result Value Ref Range Status   Campylobacter species NOT DETECTED NOT DETECTED Final   Plesimonas shigelloides NOT DETECTED NOT DETECTED Final   Salmonella species NOT DETECTED NOT DETECTED Final   Yersinia enterocolitica NOT DETECTED NOT DETECTED Final   Vibrio species NOT DETECTED NOT DETECTED Final   Vibrio cholerae NOT DETECTED NOT DETECTED Final   Enteroaggregative E coli (EAEC) NOT DETECTED NOT DETECTED Final   Enteropathogenic E coli (EPEC) NOT DETECTED NOT DETECTED Final   Enterotoxigenic E coli (ETEC) NOT DETECTED NOT DETECTED Final   Shiga like toxin producing E coli (STEC) NOT DETECTED NOT DETECTED Final   E. coli O157 NOT DETECTED NOT DETECTED Final   Shigella/Enteroinvasive E coli (EIEC) NOT DETECTED NOT DETECTED Final  Cryptosporidium NOT DETECTED NOT DETECTED Final   Cyclospora cayetanensis NOT DETECTED NOT DETECTED Final   Entamoeba histolytica NOT DETECTED NOT DETECTED Final   Giardia lamblia NOT DETECTED NOT DETECTED Final   Adenovirus F40/41 NOT DETECTED NOT DETECTED Final   Astrovirus NOT DETECTED NOT DETECTED Final   Norovirus GI/GII NOT DETECTED NOT DETECTED Final   Rotavirus A NOT DETECTED NOT DETECTED Final   Sapovirus (I, II, IV, and V) NOT DETECTED NOT DETECTED Final  MRSA PCR Screening     Status: None   Collection Time: 04/12/15  2:33 PM  Result Value Ref Range Status   MRSA by PCR NEGATIVE NEGATIVE Final    Comment:        The GeneXpert MRSA Assay (FDA approved for NASAL specimens only), is one component of a comprehensive  MRSA colonization surveillance program. It is not intended to diagnose MRSA infection nor to guide or monitor treatment for MRSA infections.   Culture, routine-abscess     Status: None   Collection Time: 04/14/15  1:31 PM  Result Value Ref Range Status   Specimen Description ABSCESS PERITONEAL CAVITY  Final   Special Requests LEFT PSOAS AND SUBDIAPHRAGMATIC ABSCESS  Final   Gram Stain   Final    ABUNDANT WBC PRESENT, PREDOMINANTLY PMN NO SQUAMOUS EPITHELIAL CELLS SEEN FEW GRAM POSITIVE COCCI IN PAIRS Performed at [facility]    Culture   Final    NO GROWTH 2 DAYS Performed at [facility]    Report Status 04/17/2015 FINAL  Final    Impression/Plan:  1. Intra abdominal abscess - no growth to date but noted gram stain.  Have changed to iv ceftriaxone 2 grams daily and flagyl 500 mg three times per day.   -plan on 4 weeks, but will depend on drainage (longer or shorter) We will arrange follow up with him in 2-3 weeks, after drain clinic appt and repeat CT scan  2. dispo - picc line in  I will sign off, please call with questions

## 2015-04-17 NOTE — Progress Notes (Signed)
Occupational Therapy Evaluation Patient Details Name: SIRANTHONY ERNANDEZ MRN: [id number] DOB: 1965-09-23 Today's Date: 04/17/2015    History of Present Illness 50 yo male with ischemic bowel disease who underwent Ex-lap with left, colectomy, with end colostomy which was complicated by intra-abdominal abscess and rectal stump leak in December of 2016. Over the last week he has noted increased LLQ abdominal pain with bloating, n/v and profuse ostomy output and was febrile. S/p drainage of abscess with JP drain placed.     Clinical Impression   Patient presents to OT at independent/modified independent level with functional mobility and ADLs. No further OT services warranted at this time. OT will sign off.    Follow Up Recommendations  No OT follow up    Equipment Recommendations  None recommended by OT    Recommendations for Other Services       Precautions / Restrictions Precautions Precautions: None Restrictions Weight Bearing Restrictions: No      Mobility Bed Mobility Overal bed mobility: Modified Independent             General bed mobility comments: increased time and effort  Transfers Overall transfer level: Modified independent Equipment used: None                  Balance                                            ADL Overall ADL's : Modified independent                                             Vision     Perception     Praxis      Pertinent Vitals/Pain Pain Assessment: 0-10 Pain Score: 0-No pain     Hand Dominance Right   Extremity/Trunk Assessment Upper Extremity Assessment Upper Extremity Assessment: Overall WFL for tasks assessed   Lower Extremity Assessment Lower Extremity Assessment: Overall WFL for tasks assessed   Cervical / Trunk Assessment Cervical / Trunk Assessment: Normal   Communication Communication Communication: No difficulties   Cognition Arousal/Alertness:  Awake/alert Behavior During Therapy: WFL for tasks assessed/performed Overall Cognitive Status: Within Functional Limits for tasks assessed                     General Comments       Exercises       Shoulder Instructions      Home Living Family/patient expects to be discharged to:: Private residence Living Arrangements: Children Available Help at Discharge: Family Type of Home: House Home Access: Level entry     Home Layout: One level     Bathroom Shower/Tub: Walk-in shower;Door   Bathroom Toilet: Handicapped height     Home Equipment: Environmental consultant - 2 wheels;Cane - single point;Bedside commode;Shower seat          Prior Functioning/Environment Level of Independence: Independent        Comments: drives, does own groceries, takes his son to school    OT Diagnosis: Generalized weakness   OT Problem List: Decreased strength   OT Treatment/Interventions:      OT Goals(Current goals can be found in the care plan section) Acute Rehab OT Goals Patient Stated Goal: home today OT Goal Formulation: All assessment and  education complete, DC therapy  OT Frequency:     Barriers to D/C:            Co-evaluation              End of Session Nurse Communication: Other (comment) (pt wanted to see his nurse)  Activity Tolerance: Patient tolerated treatment well Patient left: in bed;with call bell/phone within reach   Time: 0947-1005 OT Time Calculation (min): 18 min Charges:  OT General Charges $OT Visit: 1 Procedure OT Evaluation $OT Eval Moderate Complexity: 1 Procedure G-Codes:    Keyunna Coco A May 14, 2015, 12:50 PM

## 2015-04-17 NOTE — Progress Notes (Signed)
Per pt he takes Coumadin 5mg  daily at home; paged Dr. Posey Pronto and made him aware.

## 2015-04-17 NOTE — Progress Notes (Signed)
Patient ID: [facility], male   DOB: 28-Jun-1965, 50 y.o.   MRN: [id number]   LOS: 5 days   Subjective: Feeling better every day. Only mild pain at PICC and drain site. Ostomy functional.   Objective: Vital signs in last 24 hours: Temp:  [97.5 F (36.4 C)-98.3 F (36.8 C)] 97.9 F (36.6 C) (03/31 0758) Pulse Rate:  [53-119] 99 (03/31 0758) Resp:  [10-26] 17 (03/31 0758) BP: (120-142)/(77-95) 120/81 mmHg (03/31 0758) SpO2:  [96 %-100 %] 96 % (03/31 0758) Weight:  [116.7 kg (257 lb 4.4 oz)] 116.7 kg (257 lb 4.4 oz) (03/31 0513) Last BM Date: 04/16/15   Drain 1: 61ml/24h (bloody) Drain 2: 3ml/24h (cloudy)   Physical Exam General appearance: alert and no distress Resp: clear to auscultation bilaterally Cardio: irregularly irregular rhythm GI: Soft, stool in bag, incision at skin level, granulating well, NT, drains in place   Assessment/Plan: 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 He had biliary drain placed into the left psoas abscess and subdiaphragmatic abscess with 200 mL purulent drainage on 3/28. He is tolerating regular diet. Gram stain with GPC, not finalized yet. Pain controlled with oxycodone. Abx per ID. Will need to f/u with Dr. Donne Hazel after discharge, have added info in navigator. [facility] for discharge from surgery standpoint.    Lisette Abu, PA-C Pager: [phone] 04/17/2015

## 2015-04-17 NOTE — Care Management Note (Addendum)
Case Management Note  Patient Details  Name: KAIGE EVINS MRN: [id number] Date of Birth: 05/13/1965  Subjective/Objective:                CM following for progression and d/c planning.    Action/Plan: 04/17/2015 Noted consult d/c of pt with IV antibiotics.  Pt active with [facility] for other services at this time. [place] notified of possible IV antibiotics at the time of d/c. Await progress and prescriptions.  Spoke with Dr Posey Pronto and prescription received , this was given to [facility] and discussed plan for d/c to home tomorrow 04/18/2015.  Expected Discharge Date:    04/18/2015              Expected Discharge Plan:  [place]  In-House Referral:     Discharge planning Services  CM Consult  Post Acute Care Choice:  Resumption of Svcs/PTA Provider Choice offered to:  Patient  DME Arranged:   IV antibiotics and supplies  DME Agency:   [facility]  HH Arranged:  RN [place] Agency:  [facility] [facility]  Status of Service:  In process, will continue to follow  Medicare Important Message Given:    Date Medicare IM Given:    Medicare IM give by:    Date Additional Medicare IM Given:    Additional Medicare Important Message give by:     If discussed at [place] of Stay Meetings, dates discussed:    Additional Comments:  Adron Bene, RN 04/17/2015, 12:41 PM

## 2015-04-18 LAB — CBC
HCT: 30.1 % — ABNORMAL LOW (ref 39.0–52.0)
HEMOGLOBIN: 9.6 g/dL — AB (ref 13.0–17.0)
MCH: 30.2 pg (ref 26.0–34.0)
MCHC: 31.9 g/dL (ref 30.0–36.0)
MCV: 94.7 fL (ref 78.0–100.0)
Platelets: 465 10*3/uL — ABNORMAL HIGH (ref 150–400)
RBC: 3.18 MIL/uL — ABNORMAL LOW (ref 4.22–5.81)
RDW: 16.4 % — AB (ref 11.5–15.5)
WBC: 12.7 10*3/uL — ABNORMAL HIGH (ref 4.0–10.5)

## 2015-04-18 LAB — BASIC METABOLIC PANEL
ANION GAP: 7 (ref 5–15)
BUN: 13 mg/dL (ref 6–20)
CALCIUM: 8.9 mg/dL (ref 8.9–10.3)
CO2: 28 mmol/L (ref 22–32)
Chloride: 104 mmol/L (ref 101–111)
Creatinine, Ser: 1.01 mg/dL (ref 0.61–1.24)
GFR calc Af Amer: >60 mL/min (ref >60)
GFR calc non Af Amer: >60 mL/min (ref >60)
GLUCOSE: 83 mg/dL (ref 65–99)
POTASSIUM: 3.7 mmol/L (ref 3.5–5.1)
Sodium: 139 mmol/L (ref 135–145)

## 2015-04-18 LAB — GLUCOSE, CAPILLARY
GLUCOSE-CAPILLARY: 89 mg/dL (ref 65–99)
Glucose-Capillary: 147 mg/dL — ABNORMAL HIGH (ref 65–99)
Glucose-Capillary: 117 mg/dL — ABNORMAL HIGH (ref 65–99)
Glucose-Capillary: 123 mg/dL — ABNORMAL HIGH (ref 65–99)

## 2015-04-18 LAB — HEPATITIS C ANTIBODY (REFLEX): HCV AB: 0.1 {s_co_ratio} (ref 0.0–0.9)

## 2015-04-18 LAB — MAGNESIUM: Magnesium: 2 mg/dL (ref 1.7–2.4)

## 2015-04-18 LAB — PROTIME-INR
INR: 2.01 — AB (ref 0.00–1.49)
PROTHROMBIN TIME: 22.6 s — AB (ref 11.6–15.2)

## 2015-04-18 LAB — HCV COMMENT:

## 2015-04-18 LAB — HIV ANTIBODY (ROUTINE TESTING W REFLEX): HIV SCREEN 4TH GENERATION: NONREACTIVE

## 2015-04-18 MED ORDER — METRONIDAZOLE 500 MG PO TABS
500.0000 mg | ORAL_TABLET | Freq: Three times a day (TID) | ORAL | Status: DC
Start: 2015-04-18 — End: 2015-04-19

## 2015-04-18 MED ORDER — METOPROLOL TARTRATE 100 MG PO TABS
100.0000 mg | ORAL_TABLET | Freq: Two times a day (BID) | ORAL | Status: DC
  Administered 2015-04-18 – 2015-04-19 (×2): 100 mg via ORAL
  Filled 2015-04-18 (×2): qty 1

## 2015-04-18 MED ORDER — CEFTRIAXONE SODIUM 2 G IJ SOLR: 2.0000 g | INTRAMUSCULAR | Status: AC

## 2015-04-18 MED ORDER — OXYCODONE HCL 10 MG PO TABS: 10.0000 mg | ORAL_TABLET | ORAL | Status: DC | PRN

## 2015-04-18 MED ORDER — DEXTROSE 5 % IV SOLN
2.0000 g | Freq: Once | INTRAVENOUS | Status: AC
  Administered 2015-04-18: 2 g via INTRAVENOUS
  Filled 2015-04-18: qty 2

## 2015-04-18 MED ORDER — WARFARIN SODIUM 4 MG PO TABS
4.0000 mg | ORAL_TABLET | Freq: Once | ORAL | Status: AC
  Administered 2015-04-18: 4 mg via ORAL
  Filled 2015-04-18: qty 1

## 2015-04-18 NOTE — Progress Notes (Signed)
Patient ID: [facility], male   DOB: 04-05-1965, 50 y.o.   MRN: [id number]    Referring Physician(s): Judeth Horn  Supervising Physician: Daryll Brod  Chief Complaint:  Psoas/subdiaphragmatic abscesses  Subjective:  Pt feels about same; some soreness at drains; hoping to go home in next 24- 48 hrs  Allergies: Contrast media  Medications: Prior to Admission medications   Medication Sig Start Date End Date Taking? Authorizing Provider  acetaminophen (TYLENOL) 500 MG tablet Take 500-1,000 mg by mouth every 6 (six) hours as needed for mild pain or headache.   Yes Historical Provider, MD  digoxin (LANOXIN) 0.125 MG tablet Take 1 tablet (0.125 mg total) by mouth daily. 03/23/15  Yes Sherran Needs, NP  diltiazem (CARDIZEM CD) 120 MG 24 hr capsule Take 1 capsule (120 mg total) by mouth daily. 03/23/15  Yes Sherran Needs, NP  diphenhydrAMINE (BENADRYL) 25 mg capsule Take 2 capsules (50 mg total) by mouth once. Patient taking differently: Take 50 mg by mouth as needed (for imaging scanes).  03/17/15  Yes Aletta Edouard, MD  feeding supplement (BOOST / RESOURCE BREEZE) LIQD Take 1 Container by mouth 3 (three) times daily between meals. 02/02/15  Yes Liberty Handy, MD  furosemide (LASIX) 40 MG tablet Take 1 tablet (40 mg total) by mouth daily. 03/23/15  Yes Sherran Needs, NP  lisinopril (PRINIVIL,ZESTRIL) 5 MG tablet Take 1 tablet (5 mg total) by mouth daily. 02/25/15  Yes Thompson Grayer, MD  Metoprolol Tartrate 75 MG TABS Take 150 mg by mouth 2 (two) times daily. 03/12/15  Yes Historical Provider, MD  warfarin (COUMADIN) 5 MG tablet Take as directed by [facility] Patient taking differently: Take 5 mg by mouth daily.  03/09/15  Yes Thompson Grayer, MD  cefTRIAXone 2 g in dextrose 5 % 50 mL Inject 2 g into the vein daily. 04/18/15 05/14/15  Rushil Sherrye Payor, MD  ciprofloxacin (CIPRO) 500 MG tablet Take 1 tablet (500 mg total) by mouth 2 (two) times daily. Patient not taking: Reported on 04/12/2015  02/24/15   Erby Pian, NP  escitalopram (LEXAPRO) 10 MG tablet Take 1 tablet (10 mg total) by mouth daily. 04/17/15   Zada Finders, MD  metoprolol (LOPRESSOR) 100 MG tablet Take 1 tablet (100 mg total) by mouth 2 (two) times daily. Patient not taking: Reported on 04/12/2015 03/23/15   Sherran Needs, NP  metroNIDAZOLE (FLAGYL) 500 MG tablet Take 1 tablet (500 mg total) by mouth 3 (three) times daily. 04/18/15 05/14/15  Rushil Sherrye Payor, MD  neomycin-bacitracin-polymyxin (NEOSPORIN) OINT Apply 1 application topically daily. 04/17/15   Zada Finders, MD  oxyCODONE 10 MG TABS Take 1 tablet (10 mg total) by mouth every 4 (four) hours as needed for moderate pain. 04/18/15   Rushil Sherrye Payor, MD     Vital Signs: BP 126/82 mmHg  Pulse 100  Temp(Src) 97.9 F (36.6 C) (Oral)  Resp 18  Ht 5\' 10"  (1.778 m)  Wt 256 lb 13.4 oz (116.5 kg)  BMI 36.85 kg/m2  SpO2 96%  Physical Exam left abd drains in place; 2 of 3 lower drains no longer have suture attachment in place and minimal outputs; LUQ drain with 20 cc output; cx's neg  Imaging: Dg Chest Port 1 View  04/16/2015  CLINICAL DATA:  Status post right PICC placement today. Multiple intra-abdominal abscesses. Initial encounter. EXAM: PORTABLE CHEST 1 VIEW COMPARISON:  Single view of the chest 04/12/2015. FINDINGS: A new right PICC is in place with the tip  in the lower superior vena cava. Right IJ catheter is again seen. There is left worse than right basilar airspace disease and likely a small left effusion, unchanged. No pneumothorax. Cardiomegaly and vascular congestion noted. IMPRESSION: Tip of right PICC projects in the lower superior vena cava. No change in left worse than right basilar airspace disease and small left effusion. Cardiomegaly and vascular congestion. Electronically Signed   By: Inge Rise M.D.   On: 04/16/2015 14:12   Ct Image Guided Fluid Drain By Catheter  04/15/2015  INDICATION: 50 year old male with a history of ischemic colitis status  post bowel resection and colostomy complicated by multiple intra-abdominal abscesses. He has undergone placement of multiple percutaneous drainage catheters previously. Most recent CT imaging which was performed for persistent abdominal pain and intermittent low-grade fevers demonstrates a residual un treated subdiaphragmatic abscess. This abscess appears to communicate via a thin channel with residual fluid tracking along the anterior aspect of the left psoas muscle. There was an abscess in this location on prior imaging. He presents today for drainage of these left retroperitoneal psoas and subdiaphragmatic collections. EXAM: CT IMAGE GUIDED FLUID DRAIN BY CATHETER MEDICATIONS: The patient is currently admitted to the hospital and receiving intravenous antibiotics. The antibiotics were administered within an appropriate time frame prior to the initiation of the procedure. ANESTHESIA/SEDATION: Fentanyl 150 mcg IV; Versed Three mg IV Moderate Sedation Time:  40 The patient was continuously monitored during the procedure by the interventional radiology nurse under my direct supervision. COMPLICATIONS: None immediate. PROCEDURE: Informed written consent was obtained from the patient after a thorough discussion of the procedural risks, benefits and alternatives. All questions were addressed. Maximal Sterile Barrier Technique was utilized including caps, mask, sterile gowns, sterile gloves, sterile drape, hand hygiene and skin antiseptic. A timeout was performed prior to the initiation of the procedure. A planning axial CT scan was performed. The fluid collection anterior to the left psoas muscle was successfully identified. A suitable skin entry site was selected and marked. The region was sterilely prepped and draped in standard fashion using chlorhexidine skin prep. Local anesthesia was attained by infiltration with 1% lidocaine. Using intermittent CT imaging, a 15 cm 18 gauge trocar needle was carefully advanced  through the left lower quadrant abdominal wall and into the psoas fluid collection. A wire was then carefully manipulated in until it was successfully passed cephalad into the subdiaphragmatic collection. The skin tract was then serially dilated to 14 [place]. A Cook 22 [place] biliary drainage catheter was then advanced over the wire. Aspiration yields approximately 200 mL foul-smelling purulent fluid. Axial CT imaging demonstrates excellent position of the drainage catheter and near complete collapse of the residual abscess cavity. The catheter was connected to JP bulb suction and secured to the skin with 0 Prolene suture. IMPRESSION: Successful placement of a 25 French biliary drainage catheter so that it extends from the left psoas abscess cephalad into the left subdiaphragmatic abscess. Aspiration yields 200 mL foul-smelling purulent fluid. A sample was sent for culture. Signed, Criselda Peaches, MD [facility] Electronically Signed   By: Jacqulynn Cadet M.D.   On: 04/15/2015 09:54    Labs:  CBC:  Recent Labs  04/14/15 0504 04/16/15 0918 04/17/15 1027 04/18/15 0600  WBC 10.4 12.3* 11.9* 12.7*  HGB 8.9* 9.7* 9.5* 9.6*  HCT 29.8* 30.6* 30.6* 30.1*  PLT 546* 494* 526* 465*    COAGS:  Recent Labs  04/13/15 1730  04/15/15 0605 04/16/15 0918 04/17/15  1027 04/18/15 0600  INR 2.62*  < > 1.53* 1.91* 2.22* 2.01*  APTT 49*  --   --   --   --   --   < > = values in this interval not displayed.  BMP:  Recent Labs  04/15/15 0605 04/16/15 0918 04/17/15 1027 04/18/15 0600  NA 140 141 141 139  K 3.7 3.2* 3.3* 3.7  CL 104 105 105 104  CO2 28 29 28 28   GLUCOSE 137* 119* 127* 83  BUN 21* 15 12 13   CALCIUM 9.3 9.0 9.1 8.9  CREATININE 1.45* 1.18 1.09 1.01  GFRNONAA 56* >60 >60 >60  GFRAA >60 >60 >60 >60    LIVER FUNCTION TESTS:  Recent Labs  04/11/15 2358 04/12/15 1430 04/14/15 0504 04/15/15 0605  BILITOT 0.4 0.3  0.6 0.4  AST 22 14* 13* 13*  ALT 44 31 23 18   ALKPHOS 86 68 59 55  PROT 8.4* 7.1 7.1 6.6  ALBUMIN 2.9* 2.3* 2.5* 2.3*    Assessment and Plan: S/p drainage of mult left abd abscesses, most recently on 04/15/15 (hx ischemic colitis with bowel resection/colostomy); AF; WBC 12.7(11.9); HGB 9.6; creat ok; latest fluid cx's neg; cont drain irrigations, daily dressing changes, output monitoring; will need f/u in drain clinic [id number]) after discharge   Electronically Signed: D. Rowe Robert 04/18/2015, 11:42 AM   I spent a total of 15 minutes at the the patient's bedside AND on the patient's hospital floor or unit, greater than 50% of which was counseling/coordinating care for abdominal abscess drains

## 2015-04-18 NOTE — Progress Notes (Signed)
Subjective: He looks forward to going home today. He has already reached out to his [place] and reports having a son at home who can help him with his prescriptions.   Objective: Vital signs in last 24 hours: Filed Vitals:   04/17/15 1713 04/17/15 2017 04/18/15 0526 04/18/15 0819  BP: 127/80 129/70 133/76 126/82  Pulse: 106 91 94 100  Temp: 98.5 F (36.9 C) 97.8 F (36.6 C) 98.2 F (36.8 C) 97.9 F (36.6 C)  TempSrc: Oral Oral Oral Oral  Resp: 18 19 20 18   Height:      Weight:  256 lb 13.4 oz (116.5 kg)    SpO2: 96% 99% 98% 96%   Weight change: -7.1 oz (-0.2 kg)  Intake/Output Summary (Last 24 hours) at 04/18/15 0915 Last data filed at 04/18/15 0600  Gross per 24 hour  Intake   1325 ml  Output   1870 ml  Net   -545 ml   General: obese Caucasian male, resting in bed, no acute distress Cardiac: irregularly irregular, rate controlled Pulm: clear to auscultation bilaterally in the anterior lung fields, no wheezing Abd: colostomy on right, three drains LLQ, one drain LUQ, mild tenderness on palpation over left-sided quadrants around the drain Neuro: alert and oriented X3, moving all extremities freely   Assessment/Plan: Principal Problem:   Abdominal abscess (HCC) Active Problems:   Depression with anxiety   Obesity   Atrial fibrillation (HCC)   AKI (acute kidney injury) ([place])   Long term (current) use of anticoagulants [Z79.01]   Septic shock (HCC)   Prediabetes   Normocytic anemia   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 He had biliary drain placed into the left psoas abscess and subdiaphragmatic abscess with 200 mL purulent drainage on 3/28. He is tolerating regular diet and is afebrile. Stable from surgery standpoint for discharge with outpatient follow up. Blood cultures with no growth since 3/25 & 3/26;  abscess culture without growth since 3/28. -Appreciate surgery and ID following  -Prescribed IV ceftriaxone 2g daily x 4 weeks with stop date 05/14/15 yesterday  -Prescribed PO flagyl for 4 week course with stop date 05/14/15 -Ordered home health for PICC line maintenance -Prescribe home oxy IR 5-10 mg Q 4 hr PRN pain  Chronic atrial fibrillation - Currently rate controlled with irregularly irregular rhythm. INR today 2.01, which is stable from 2.22 yesterday, though needs to be at goal 2-3 before he can leave given all that's been going on.  -Continue coumadin 5 mg daily -Continue digoxin 0.125 mg daily and diltiazem 120 mg qd -Resume home metoprolol 100mg  twice daily  Nonoliguric AKI - Resolved. Crt 1.0 this morning, baseline near 0.7-0.9, improved from 4.09 from admission with normal urine output. Etiology most likely ischemic ATN in setting of septic shock on admission.   Chronic normocytic anemia - Hb stable at 9-10 with no active bleeding. On AC therapy since January of 2017. Etiology most likely due to chronic illness vs iron deficiency anemia from malabsorption.   Depression and anxiety - Currently with stable mood.  -Appreciate psychiatry following -Continue lexapro 10 mg daily   Prediabetes - Last A1c 6.1 on 01/15/15. -Continue moderate SSI before meals and at bedtime  -Encourage lifestyle modification  Dispo: Disposition is deferred at this time, awaiting improvement of current medical problems.  Anticipated discharge tomorrow.     LOS: 6 days  Riccardo Dubin, MD 04/18/2015, 9:15 AM

## 2015-04-18 NOTE — Progress Notes (Signed)
Patient ID: William Fischer, male   DOB: 07/21/1965, 50 y.o.   MRN: [id number] Medicine attending: Clinically stable. INR 2.1, however, heparin just stopped yesterday, and, he has only had 3 total doses of Coumadin. It is absolutely critical that he is in the mid therapeutic range off heparin 48 hours before he is stable for hospital discharge. He had a catastrophic vascular event with subsequent multiple complications on previous anticoagulant which may very well be related to subtherapeutic levels in a morbidly obese man using a drug that we cannot routinely monitor. We cannot afford to send him home until I am confident that his Coumadin level is therapeutic.   Murriel Hopper, MD, [place]  Hematology-Oncology/Internal Medicine

## 2015-04-18 NOTE — Progress Notes (Signed)
[facility] for warfarin, heparin Indication: atrial fibrillation  Allergies  Allergen Reactions  . Contrast Media [Iodinated Diagnostic Agents] Rash    a diffuse macular rash after CTA chest  Pt was premedicated with 125mg  IV Solumedrol, 50mg  IV Benadryl 1 hr prior to CTexam, and tolerated procedure without any difficulties. 11/28/11 Also same pre med protocol observed on 02/22/15 and pt tolerated the procedure well    Patient Measurements: Height: 5\' 10"  (177.8 cm) Weight: 256 lb 13.4 oz (116.5 kg) IBW/kg (Calculated) : 73 Heparin Dosing Weight: 95kg  Vital Signs: Temp: 97.9 F (36.6 C) (04/01 0819) Temp Source: Oral (04/01 0819) BP: 126/82 mmHg (04/01 0819) Pulse Rate: 100 (04/01 0819)  Labs:  Recent Labs  04/16/15 0918 04/16/15 1821 04/17/15 1027 04/18/15 0600  HGB 9.7*  --  9.5* 9.6*  HCT 30.6*  --  30.6* 30.1*  PLT 494*  --  526* 465*  LABPROT 21.8*  --  24.4* 22.6*  INR 1.91*  --  2.22* 2.01*  HEPARINUNFRC 0.26* 0.26* 0.58  --   CREATININE 1.18  --  1.09 1.01    Estimated Creatinine Clearance: 115.6 mL/min (by C-G formula based on Cr of 1.01).  Assessment: 60 yom on chronic Coumadin for afib is now s/p drain placement. Warfarin had been held and reversed with vitamin K for the procedure but restarted on 3/29 along with heparin.   INR continues to be therapeutic today at 2.01, down from 2.22 yesterday. However warfarin restarted several days ago and not yet at steady state. Hgb 9.6 stable, plt 465. No noted bleeding. Pt currently on metronidazole, which may increase INR. Will monitor closely and decrease dose slightly in anticipation of INR elevation.   PTA warfarin: 5 mg qday   Goal of Therapy:  INR 2-3 Heparin level 0.3-0.7 units/ml Monitor platelets by anticoagulation protocol: Yes   Plan:  - Warfarin 4 mg PO x 1 tonight  - Daily INR and CBC   Heloise Ochoa, [place].D., BCPS PGY2 Cardiology Pharmacy  Resident Pager: [phone]  04/18/2015 3:19 PM

## 2015-04-19 LAB — CBC
HCT: 30.4 % — ABNORMAL LOW (ref 39.0–52.0)
HEMOGLOBIN: 9.3 g/dL — AB (ref 13.0–17.0)
MCH: 29 pg (ref 26.0–34.0)
MCHC: 30.6 g/dL (ref 30.0–36.0)
MCV: 94.7 fL (ref 78.0–100.0)
Platelets: 467 10*3/uL — ABNORMAL HIGH (ref 150–400)
RBC: 3.21 MIL/uL — ABNORMAL LOW (ref 4.22–5.81)
RDW: 16.8 % — AB (ref 11.5–15.5)
WBC: 12.8 10*3/uL — ABNORMAL HIGH (ref 4.0–10.5)

## 2015-04-19 LAB — GLUCOSE, CAPILLARY
Glucose-Capillary: 90 mg/dL (ref 65–99)
Glucose-Capillary: 142 mg/dL — ABNORMAL HIGH (ref 65–99)

## 2015-04-19 LAB — PROTIME-INR
INR: 2.29 — AB (ref 0.00–1.49)
PROTHROMBIN TIME: 25 s — AB (ref 11.6–15.2)

## 2015-04-19 MED ORDER — METRONIDAZOLE 500 MG PO TABS: 500.0000 mg | ORAL_TABLET | Freq: Three times a day (TID) | ORAL | Status: AC

## 2015-04-19 MED ORDER — WARFARIN SODIUM 5 MG PO TABS: 5.0000 mg | ORAL_TABLET | Freq: Every day | ORAL | Status: DC

## 2015-04-19 MED ORDER — WARFARIN - PHYSICIAN DOSING INPATIENT: Freq: Every day | Status: DC

## 2015-04-19 MED ORDER — HEPARIN SOD (PORK) LOCK FLUSH 100 UNIT/ML IV SOLN
250.0000 [IU] | INTRAVENOUS | Status: AC | PRN
  Administered 2015-04-19: 250 [IU]

## 2015-04-19 MED ORDER — BACITRACIN-NEOMYCIN-POLYMYXIN OINTMENT TUBE: 1.0000 "application " | TOPICAL_OINTMENT | Freq: Every day | CUTANEOUS | Status: DC

## 2015-04-19 MED ORDER — DEXTROSE 5 % IV SOLN
2.0000 g | Freq: Once | INTRAVENOUS | Status: AC
  Administered 2015-04-19: 2 g via INTRAVENOUS
  Filled 2015-04-19: qty 2

## 2015-04-19 NOTE — Progress Notes (Signed)
   Subjective: He did not go home yesterday as INR decreased to 2.0 yesterday though was optimistic when we explained to him that it had increased back to 2.3. He denies any worsening pain and acknowledges his son will be of assistance at home.  Objective: Vital signs in last 24 hours: Filed Vitals:   04/18/15 0819 04/18/15 1722 04/18/15 2140 04/19/15 0615  BP: 126/82 125/80 137/71 120/82  Pulse: 100 83 83 86  Temp: 97.9 F (36.6 C) 98.2 F (36.8 C) 97.9 F (36.6 C) 98.3 F (36.8 C)  TempSrc: Oral Oral Oral Oral  Resp: 18 17 18 20   Height:      Weight:      SpO2: 96% 97% 94% 96%   Weight change:   Intake/Output Summary (Last 24 hours) at 04/19/15 1051 Last data filed at 04/19/15 0600  Gross per 24 hour  Intake    600 ml  Output 2153.5 ml  Net -1553.5 ml   General: obese Caucasian male, resting in bed, no acute distress Cardiac: irregularly irregular, rate controlled Pulm: clear to auscultation bilaterally, no wheezing Abd: colostomy on right, three drains LLQ, with mostly clear draining, one drain LUQ with serosanguinous drainage Neuro: alert and oriented X3, moving all extremities freely   Assessment/Plan: Principal Problem:   Abdominal abscess ([place]) Active Problems:   Depression with anxiety   Obesity   Atrial fibrillation (HCC)   AKI (acute kidney injury) ([place])   Long term (current) use of anticoagulants [Z79.01]   Septic shock (HCC)   Prediabetes   Normocytic anemia   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 He had biliary drain placed into the left psoas abscess and subdiaphragmatic abscess with 200 mL purulent drainage on 3/28. He is tolerating regular diet and is afebrile. Stable from surgery standpoint for discharge with outpatient follow up. Blood cultures with no growth since 3/25 & 3/26; abscess culture without  growth since 3/28. -Appreciate surgery and ID following  -Prescribed IV ceftriaxone 2g daily x 4 weeks with stop date 05/14/15 on 3/31 for CM -Prescribed PO flagyl for 4 week course with stop date 05/14/15 -Ordered home health for PICC line maintenance -Prescribe home oxy IR 5-10 mg Q4 hr PRN pain  Chronic atrial fibrillation - Currently rate controlled with irregularly irregular rhythm. INR today 2.3, which is at goal 2-3.  -Continue coumadin 5 mg daily -Continue digoxin 0.125 mg daily and diltiazem 120 mg qd -Resume home metoprolol 100mg  twice daily  Chronic normocytic anemia - Hb stable at 9-10 with no active bleeding. On AC therapy since January of 2017. Etiology most likely due to chronic illness vs iron deficiency anemia from malabsorption.   Depression and anxiety - Currently with stable mood.  -Appreciate psychiatry following -Continue lexapro 10 mg daily   Prediabetes - Last A1c 6.1 on 01/15/15. -Continue moderate SSI before meals and at bedtime  -Encourage lifestyle modification  Dispo: Discharge today.    LOS: 7 days   Riccardo Dubin, MD 04/19/2015, 10:51 AM

## 2015-04-19 NOTE — Progress Notes (Signed)
Patient refuse his abdominal dressing change.

## 2015-04-19 NOTE — Care Management (Signed)
CM contacted [facility] to confirm discharge home with IV ABX therapy arrangement. Patient received daily dose today at 11:15am,  next dose due tomorrow confirmed [facility]  will come out to the home to administered the IV ABX. Patient is still in agreeable with discharge plan, verified contact information with patient, teach back done patient verbalized understanding [place] contact information placed on AVS. No further questions or concerns verbalized. CM contact information provided should any additional questions or concerns arises. Updated Gwenlyn Perking RN on discharge plan.

## 2015-04-21 ENCOUNTER — Other Ambulatory Visit (HOSPITAL_COMMUNITY): Payer: Self-pay | Admitting: Nurse Practitioner

## 2015-04-21 ENCOUNTER — Ambulatory Visit (INDEPENDENT_AMBULATORY_CARE_PROVIDER_SITE_OTHER): Payer: Medicaid Other | Admitting: Physician Assistant

## 2015-04-21 ENCOUNTER — Encounter: Payer: Self-pay | Admitting: Physician Assistant

## 2015-04-21 VITALS — BP 100/56 | HR 78 | Ht 70.0 in | Wt 241.4 lb

## 2015-04-21 DIAGNOSIS — Z5181 Encounter for therapeutic drug level monitoring: Secondary | ICD-10-CM

## 2015-04-21 DIAGNOSIS — I5022 Chronic systolic (congestive) heart failure: Secondary | ICD-10-CM | POA: Diagnosis not present

## 2015-04-21 DIAGNOSIS — I482 Chronic atrial fibrillation, unspecified: Secondary | ICD-10-CM

## 2015-04-21 DIAGNOSIS — IMO0002 Reserved for concepts with insufficient information to code with codable children: Secondary | ICD-10-CM

## 2015-04-21 DIAGNOSIS — Z7901 Long term (current) use of anticoagulants: Secondary | ICD-10-CM

## 2015-04-21 DIAGNOSIS — K651 Peritoneal abscess: Secondary | ICD-10-CM | POA: Diagnosis not present

## 2015-04-21 MED ORDER — LISINOPRIL 5 MG PO TABS: 5.0000 mg | ORAL_TABLET | Freq: Every day | ORAL | Status: DC

## 2015-04-21 MED ORDER — DIGOXIN 125 MCG PO TABS: 0.1250 mg | ORAL_TABLET | Freq: Every day | ORAL | Status: DC

## 2015-04-21 MED ORDER — DILTIAZEM HCL ER COATED BEADS 120 MG PO CP24: 120.0000 mg | ORAL_CAPSULE | Freq: Every day | ORAL | Status: DC

## 2015-04-21 MED ORDER — FUROSEMIDE 40 MG PO TABS: 40.0000 mg | ORAL_TABLET | Freq: Every day | ORAL | Status: DC | PRN

## 2015-04-21 NOTE — Progress Notes (Signed)
Cardiology Office Note:    Date:  04/21/2015   ID:  William Fischer, DOB [birth year], MRN [id number]  PCP:  No PCP Per Patient  Cardiologist:  Dr. Thompson Grayer  / Truitt Merle, NP  Electrophysiologist:  Dr. Thompson Grayer  Gen Surgeon - Dr. Donne Hazel   Chief Complaint  Patient presents with  . Hospitalization Follow-up    AF with RVR in setting of sepsis    History of Present Illness:     William Fischer is a 50 y.o. male with a hx of chronic AFib (failed DCCV x 2, failed Sotalol) treated with rate control only, tachycardia induced NICM, obesity, tobacco abuse.   He is not felt to be a candidate for PVI ablation secondary to severe left atrial enlargement. His ejection fraction has recovered with adequate rate control. He has declined sleep apnea evaluation in the past. Last seen by Dr. Thompson Grayer in 5/16.  At that visit, he noted that he was not taking Lisinopril due to concerns noted on information from the Internet.   Admitted in 01/14/15-02/02/15 with perforated bowel and intraabdominal abscess. He underwent exploratory laparotomy with left colectomy and colostomy. This was complicated by AF with RVR and worsening LV function with related HFrEF.  He was diuresed and followed by Cardiology.  He followed up in the AFib clinic.  Review of the records seemed to indicate necrotic/ischemic bowel instead of diverticulitis and the patient was changed from Pradaxa to Coumadin.  However, the patient's INR was erratic and he was changed back to Pradaxa at DC.  This was reviewed again with Dr. Thompson Grayer who recommended Coumadin instead of Pradaxa to prevent future thrombotic/embolic events.  He was switched back to Coumadin.    Readmitted in 2/4-2/7 2017 with pelvic abscess requiring drain placement.     Last seen in AF Clinic with Roderic Palau, NP 03/23/15.     Admitted 3/26-4/2 with sepsis in the setting of worsening intra-abdominal abscess c/b AKI (peak SCr 4.09) and AF with RVR.  He required  Amiodarone gtt for rate control.  IV antibiotics x 4 weeks post DC was recommended by ID.  SCr was 1.01 at DC.   DC on Dig 0.125, Cardizem CD 120, Lasix 40, Lisinopril 5, Metoprolol tartrate 100 bid, warfarin.  Returns for FU.  Overall doing well.  He feels better.  Did not take any Lasix since DC.  Weight is down 60 lbs over past 2 mos.  Denies any chest pain or significant dyspnea.  Denies orthopnea, PND, edema. Denies syncope.  Denies any bleeding.  HHRN is checking his INR and sending the reading to our office.  He is administering his IV antibiotics via PICC line.     Anticoagulation Decision Making: This patients CHA2DS2-VASc Score and unadjusted Ischemic Stroke Rate (% per year) is equal to 3.2 % stroke rate/year from a score of 3  (CHF, previous thromboembolism) Above score calculated as 1 point each if present [CHF, HTN, DM, Vascular=MI/PAD/Aortic Plaque, Age if 65-74, or Male] Above score calculated as 2 points each if present [Age > 75, or Stroke/TIA/TE]  Estimated Creatinine Clearance: 112 mL/min (by C-G formula based on Cr of 1.01)..     Past Medical History  Diagnosis Date  . Chicken pox as a child  . Migraine 06/29/2011  . Obesity 06/29/2011  . Depression with anxiety 06/29/2011  . Atrial fibrillation ([place])     admx 11/13 with acute sCHF in setting of RVR  => a. failed DCCV x  2; b. Pradaxa started;  c. failed sotalol  . Chronic systolic heart failure ([place])     a. echo 11/13: Ef 40-45%, diff HK, mod MR, mod LAE, mild RVE, mod RAE, small effusion;   b. TEE 11/13:  EF 35-40%, no LAA clot; Echo 2/14 shows normal EF  . Cardiomyopathy ([place])     likely tachy mediated in setting of AF with RVR  . Snoring     patient needs sleep study - has declined  . Allergy to IVP dye   . Chronic anticoagulation     Pradaxa    Past Surgical History  Procedure Laterality Date  . Tee without cardioversion  11/30/2011    Procedure: TRANSESOPHAGEAL ECHOCARDIOGRAM (TEE);  Surgeon: Thayer Headings, MD;  Location: [facility];  Service: Cardiovascular;  Laterality: N/A;  . Cardioversion  11/30/2011    Procedure: CARDIOVERSION;  Surgeon: Thayer Headings, MD;  Location: [facility];  Service: Cardiovascular;  Laterality: N/A;  . Cardioversion  12/03/2011    Procedure: CARDIOVERSION;  Surgeon: Jolaine Artist, MD;  Location: [facility];  Service: Cardiovascular;  Laterality: N/A;  . Laparotomy N/A 01/17/2015    Procedure: EXPLORATORY LAPAROTOMY WITH LEFT COLECTOMY AND COLOSTOMY;  Surgeon: Rolm Bookbinder, MD;  Location: [facility];  Service: General;  Laterality: N/A;    Current Medications: Outpatient Prescriptions Prior to Visit  Medication Sig Dispense Refill  . acetaminophen (TYLENOL) 500 MG tablet Take 500-1,000 mg by mouth every 6 (six) hours as needed for mild pain or headache.    . cefTRIAXone 2 g in dextrose 5 % 50 mL Inject 2 g into the vein daily. 2 g 30  . escitalopram (LEXAPRO) 10 MG tablet Take 1 tablet (10 mg total) by mouth daily. 30 tablet 2  . feeding supplement (BOOST / RESOURCE BREEZE) LIQD Take 1 Container by mouth 3 (three) times daily between meals. 12 Container 0  . metoprolol (LOPRESSOR) 100 MG tablet Take 1 tablet (100 mg total) by mouth 2 (two) times daily. 60 tablet 6  . metroNIDAZOLE (FLAGYL) 500 MG tablet Take 1 tablet (500 mg total) by mouth 3 (three) times daily. 80 tablet 0  . neomycin-bacitracin-polymyxin (NEOSPORIN) OINT Apply 1 application topically daily. For abdominal wound    . oxyCODONE 10 MG TABS Take 1 tablet (10 mg total) by mouth every 4 (four) hours as needed for moderate pain. 15 tablet 0  . warfarin (COUMADIN) 5 MG tablet Take 1 tablet (5 mg total) by mouth daily. 30 tablet 1  . digoxin (LANOXIN) 0.125 MG tablet Take 1 tablet (0.125 mg total) by mouth daily. 30 tablet 11  . diltiazem (CARDIZEM CD) 120 MG 24 hr capsule Take 1 capsule (120 mg total) by mouth daily. 60 capsule 11  . furosemide (LASIX) 40 MG tablet Take 1 tablet (40 mg total)  by mouth daily. 30 tablet 6  . lisinopril (PRINIVIL,ZESTRIL) 5 MG tablet Take 1 tablet (5 mg total) by mouth daily. 30 tablet 11   No facility-administered medications prior to visit.     Allergies:   Contrast media   Social History   Social History  . Marital Status: Divorced    Spouse Name: N/A  . Number of Children: N/A  . Years of Education: N/A   Social History Main Topics  . Smoking status: Former Smoker -- 1.00 packs/day for 20 years    Types: Cigarettes    Quit date: 07/10/2014  . Smokeless tobacco: Never Used  . Alcohol Use: No  .  Drug Use: No  . Sexual Activity: No   Other Topics Concern  . None   Social History Narrative     Family History:  The patient's family history includes Alcohol abuse in his father and paternal grandfather; Aneurysm in his father; Cancer in his maternal grandmother and paternal grandmother; Diabetes in his maternal grandfather and paternal grandmother; Hypertension in his father and mother; Parkinsonism in his maternal grandfather.   ROS:   Please see the history of present illness.    Review of Systems  HENT: Positive for headaches.   Cardiovascular: Positive for syncope.  Genitourinary: Positive for incomplete emptying.  Neurological: Positive for dizziness.  Psychiatric/Behavioral: The patient is nervous/anxious.    All other systems reviewed and are negative.   Physical Exam:    VS:  BP 100/56 mmHg  Pulse 78  Ht 5\' 10"  (1.778 m)  Wt 241 lb 6.4 oz (109.498 kg)  BMI 34.64 kg/m2   GEN: Well nourished, well developed, in no acute distress HEENT: normal Neck: no JVD, no masses Cardiac: Normal S1/S2, irreg irreg; no murmurs, rubs, or gallops, no edema   Respiratory:  clear to auscultation bilaterally; no wheezing, rhonchi or rales GI: Colostomy bag in place (stool brown); LUQ and LLQ drains in place MS: no deformity or atrophy Skin: warm and dry Neuro: No focal deficits  Psych: Alert and oriented x 3, normal affect  Wt  Readings from Last 3 Encounters:  04/21/15 241 lb 6.4 oz (109.498 kg)  04/17/15 256 lb 13.4 oz (116.5 kg)  03/27/15 251 lb 1.6 oz (113.898 kg)    Studies/Labs Reviewed:     EKG:  EKG is   ordered today.  The ekg ordered today demonstrates Atrial fibrillation, HR 78  Recent Labs: 01/26/2015: B Natriuretic Peptide 301.9* 04/15/2015: ALT 18 04/18/2015: BUN 13; Creatinine, Ser 1.01; Magnesium 2.0; Potassium 3.7; Sodium 139 04/19/2015: Hemoglobin 9.3*; Platelets 467*   Recent Lipid Panel    Component Value Date/Time   CHOL 149 07/16/2013 1412   TRIG 156* 01/17/2015 1923   HDL 46.20 07/16/2013 1412   CHOLHDL 3 07/16/2013 1412   VLDL 16.0 07/16/2013 1412   LDLCALC 87 07/16/2013 1412    Additional studies/ records that were reviewed today include:   Echo 12/16 Poor image quality, EF 45-50%, mild MR, moderate LAE, small lateral pericardial effusion, atrial septal lipomatous hypertrophy  Echo 3/15 EF 55-60%, mild MR, severe LAE, moderate RAE  Echo 2/14 Mild LVH, EF 50-55%, normal wall motion, mild MR, moderate LAE, mild RAE  Echo 11/13 EF 40%, diffuse HK, moderate MR, moderate LAE, mildly reduced RVSF, moderate RAE, small pericardial effusion   ASSESSMENT:     1. Chronic atrial fibrillation ([place])   2. Chronic systolic heart failure ([place])   3. Abdominal abscess (HCC)   4. Anticoagulated on Coumadin     PLAN:     In order of problems listed above:  1. Chronic AFib - Rate is controlled. Several recent admissions with intra-abdominal infections stemming from perforated ischemic bowel resulting in uncontrolled VR.  He was just DC again from [facility] 04/19/15.  He was switched to Coumadin due to concerns of thromboembolism while on Pradaxa for anticoagulation.  He inquired today if he could take Eliquis or Xarelto.  I counseled him on the need for Coumadin given his hx of thromboembolism on Pradaxa.  Defer any changes in anticoagulation to Dr. Rayann Heman.    -  Arrange BMET, Dig level  -   Continue current regimen  -  Coumadin managed by CVRR via HHRN draws  -  FU with Dr. Thompson Grayer in 2 mos  2. Chronic Systolic CHF -  HFrEF in the setting of tachycardia induced DCM.  EF previously returned to normal with controlled HR.  Echo in 12/16 with reduced EF in the setting of AF with RVR.  He has had issues with volume excess during recent hospitalizations due to fluid resuscitation and uncontrolled HR.  Volume is currently stable.  He stopped Lasix several days ago.  Ideally he should not be on calcium channel blocker (Diltiazem) with hx of cardiomyopathy.  But, he needs this for rate control.  -  Weigh daily.  He understands when he should take Lasix  -  Continue beta-blocker, ACE inhibitor, Digoxin  -  Consider FU echo in several mos  3. Abdominal abscess - Related to perforated ischemic bowel s/p laparotomy with hemicolectomy.  Now with intra-abdominal drains.  FU with Dr. Donne Hazel as planned.   4. Coumadin - Managed by CVRR.      Medication Adjustments/Labs and Tests Ordered: Current medicines are reviewed at length with the patient today.  Concerns regarding medicines are outlined above.  Medication changes, Labs and Tests ordered today are outlined in the Patient Instructions noted below. Patient Instructions  Medication Instructions:  Your physician recommends that you continue on your current medications as directed. Please refer to the Current Medication list given to you today.  Labwork: PLEASE HAVE THE HOME HEALTH RN GET LAB WORK THIS WEEK OR EARLY NEXT WEEK ( BMET, DIGOXIN LEVEL; YOU WILL NEED TO BE SURE TO HOLD YOUR DIGOXIN THE MORNING OF THE LAB WORK.) PLEASE HAVE THE RESULTS FAXED TO [place], [place]  Testing/Procedures: NONE  Follow-Up: DR. Rayann Heman IN 6-8 WEEKS  Any Other Special Instructions Will Be Listed Below (If Applicable). MONITOR WEIGHT DAILY AND TAKE LASIX IF WEIGHT IS UP 3 LB'S X 1 DAY OR 5 LB'S X 1 WEEK  If you need a refill on your  cardiac medications before your next appointment, please call your pharmacy.   Signed, Richardson Dopp, PA-C  04/21/2015 3:14 PM    [facility] [address], [place], [place]  [address] Phone: [phone]; Fax: [phone]

## 2015-04-21 NOTE — Patient Instructions (Addendum)
Medication Instructions:  Your physician recommends that you continue on your current medications as directed. Please refer to the Current Medication list given to you today.  Labwork: PLEASE HAVE THE HOME HEALTH RN GET LAB WORK THIS WEEK OR EARLY NEXT WEEK ( BMET, DIGOXIN LEVEL; YOU WILL NEED TO BE SURE TO HOLD YOUR DIGOXIN THE MORNING OF THE LAB WORK.) PLEASE HAVE THE RESULTS FAXED TO [place], [place]  Testing/Procedures: NONE  Follow-Up: DR. Rayann Heman IN 6-8 WEEKS  Any Other Special Instructions Will Be Listed Below (If Applicable). MONITOR WEIGHT DAILY AND TAKE LASIX IF WEIGHT IS UP 3 LB'S X 1 DAY OR 5 LB'S X 1 WEEK  If you need a refill on your cardiac medications before your next appointment, please call your pharmacy.

## 2015-04-23 ENCOUNTER — Other Ambulatory Visit (HOSPITAL_COMMUNITY): Payer: Self-pay | Admitting: Interventional Radiology

## 2015-04-23 ENCOUNTER — Ambulatory Visit
Admission: RE | Admit: 2015-04-23 | Discharge: 2015-04-23 | Disposition: A | Discharge status: Home / Self Care | Payer: Medicaid Other | Source: Ambulatory Visit | Attending: General Surgery | Admitting: General Surgery

## 2015-04-23 DIAGNOSIS — T814XXD Infection following a procedure, subsequent encounter: Principal | ICD-10-CM

## 2015-04-23 DIAGNOSIS — IMO0001 Reserved for inherently not codable concepts without codable children: Secondary | ICD-10-CM

## 2015-04-23 DIAGNOSIS — K651 Peritoneal abscess: Principal | ICD-10-CM

## 2015-04-23 MED ORDER — IOPAMIDOL (ISOVUE-300) INJECTION 61%
125.0000 mL | Freq: Once | INTRAVENOUS | Status: AC | PRN
  Administered 2015-04-23: 125 mL via INTRAVENOUS

## 2015-04-23 NOTE — Progress Notes (Signed)
Patient ID: William Fischer, male   DOB: 05-05-1965, 50 y.o.   MRN: [id number]        Chief Complaint: Percutaneous drainage catheter management  Referring Physician(s): [place]  History of Present Illness: William Fischer is a 50 y.o. 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  The patient subsequently underwent multiple percutaneous drainage catheter repositioning, upsizing and placements, most recently, undergoing a CT-guided percutaneous drainage catheter placement into residual fluid collection about the left iliopsoas musculature on 04/04/2015.  Patient returns to the interventional radiology clinic for percutaneous drainage catheter management. He is accompanied by his mother though serves as his own historian.  The patient reports no output from the any of the existing percutaneous drainage catheters for the past week. The patient continues to flush the percutaneous drainage catheters. No fever or chills.  Past Medical History  Diagnosis Date  . Chicken pox as a child  . Migraine 06/29/2011  . Obesity 06/29/2011  . Depression with anxiety 06/29/2011  . Atrial fibrillation ([place])     admx 11/13 with acute sCHF in setting of RVR  => a. failed DCCV x 2; b. Pradaxa started;  c. failed sotalol  . Chronic systolic heart failure ([place])     a. echo 11/13: Ef 40-45%, diff HK, mod MR, mod LAE, mild RVE, mod RAE, small effusion;   b. TEE 11/13:  EF 35-40%, no LAA clot; Echo 2/14 shows normal EF  . 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 Snoring     patient needs sleep study - has declined  . Allergy to IVP dye   . Chronic anticoagulation     Pradaxa    Past Surgical History  Procedure Laterality Date  . Tee  without cardioversion  11/30/2011    Procedure: TRANSESOPHAGEAL ECHOCARDIOGRAM (TEE);  Surgeon: Thayer Headings, MD;  Location: [facility];  Service: Cardiovascular;  Laterality: N/A;  . Cardioversion  11/30/2011    Procedure: CARDIOVERSION;  Surgeon: Thayer Headings, MD;  Location: [facility];  Service: Cardiovascular;  Laterality: N/A;  . Cardioversion  12/03/2011    Procedure: CARDIOVERSION;  Surgeon: Jolaine Artist, MD;  Location: [facility];  Service: Cardiovascular;  Laterality: N/A;  . Laparotomy N/A 01/17/2015    Procedure: EXPLORATORY LAPAROTOMY WITH LEFT COLECTOMY AND COLOSTOMY;  Surgeon: Rolm Bookbinder, MD;  Location: [facility];  Service: General;  Laterality: N/A;    Allergies: Contrast media  Medications: Prior to Admission medications   Medication Sig Start Date End Date Taking? Authorizing Provider  acetaminophen (TYLENOL) 500 MG tablet Take 500-1,000 mg by mouth every 6 (six) hours as needed for mild pain or headache.    Historical Provider, MD  cefTRIAXone 2 g in dextrose 5 % 50 mL Inject 2 g into the vein daily. 04/18/15 05/14/15  Rushil Sherrye Payor, MD  digoxin (LANOXIN) 0.125 MG tablet Take 1 tablet (0.125 mg total) by mouth daily. 04/21/15   Liliane Shi, PA-C  diltiazem (CARDIZEM CD) 120 MG 24 hr capsule Take 1 capsule (120 mg total) by mouth daily. 04/21/15   Liliane Shi, PA-C  escitalopram (LEXAPRO) 10 MG tablet Take 1 tablet (10 mg total) by mouth daily. 04/17/15   Zada Finders, MD  feeding supplement (BOOST / RESOURCE BREEZE) LIQD Take  1 Container by mouth 3 (three) times daily between meals. 02/02/15   Liberty Handy, MD  furosemide (LASIX) 40 MG tablet Take 1 tablet (40 mg total) by mouth daily as needed for edema. 04/21/15   Liliane Shi, PA-C  lisinopril (PRINIVIL,ZESTRIL) 5 MG tablet Take 1 tablet (5 mg total) by mouth daily. 04/21/15   Liliane Shi, PA-C  metoprolol (LOPRESSOR) 100 MG tablet Take 1 tablet (100 mg total) by mouth 2 (two) times daily. 03/23/15   Sherran Needs, NP  metroNIDAZOLE (FLAGYL) 500 MG tablet Take 1 tablet (500 mg total) by mouth 3 (three) times daily. 04/19/15 05/15/15  Rushil Sherrye Payor, MD  neomycin-bacitracin-polymyxin (NEOSPORIN) OINT Apply 1 application topically daily. For abdominal wound 04/19/15   Rushil Sherrye Payor, MD  oxyCODONE 10 MG TABS Take 1 tablet (10 mg total) by mouth every 4 (four) hours as needed for moderate pain. 04/18/15   Rushil Sherrye Payor, MD  warfarin (COUMADIN) 5 MG tablet Take 1 tablet (5 mg total) by mouth daily. 04/19/15   Rushil Sherrye Payor, MD     Family History  Problem Relation Age of Onset  . Hypertension Mother   . Hypertension Father   . Aneurysm Father   . Alcohol abuse Father   . Cancer Maternal Grandmother     brain  . Diabetes Maternal Grandfather     type 2  . Parkinsonism Maternal Grandfather   . Cancer Paternal Grandmother     breast  . Diabetes Paternal Grandmother   . Alcohol abuse Paternal Grandfather     Social History   Social History  . Marital Status: Divorced    Spouse Name: N/A  . Number of Children: N/A  . Years of Education: N/A   Social History Main Topics  . Smoking status: Former Smoker -- 1.00 packs/day for 20 years    Types: Cigarettes    Quit date: 07/10/2014  . Smokeless tobacco: Never Used  . Alcohol Use: No  . Drug Use: No  . Sexual Activity: No   Other Topics Concern  . Not on file   Social History Narrative    ECOG Status: 1 - Symptomatic but completely ambulatory  Review of Systems: A 12 point ROS discussed and pertinent positives are indicated in the HPI above.  All other systems are negative.  Review of Systems  Vital Signs: BP 107/72 mmHg  Pulse 96  Temp(Src) 97.7 F (36.5 C) (Oral)  SpO2 97%  Physical Exam  Mallampati Score:     Imaging: Ct Abdomen Pelvis Wo Contrast  04/12/2015  CLINICAL DATA:  Constant ongoing abdominal pain and left flank pain, worsening over the last week. Fever, shortness of breath, and generalized weakness. EXAM: CT  ABDOMEN AND PELVIS WITHOUT CONTRAST TECHNIQUE: Multidetector CT imaging of the abdomen and pelvis was performed following the standard protocol without IV contrast. COMPARISON:  03/26/2015 FINDINGS: Small left pleural effusion with consolidation in the left base possibly pneumonia. Sub cm low-attenuation lesion in the posterior right lobe of the liver similar to prior study. Gallbladder, pancreas, spleen, adrenal glands, kidneys, abdominal aorta, inferior vena cava, and retroperitoneal lymph nodes are unremarkable. There is a loculated fluid collection under the left hemidiaphragm and posterior to the spleen measuring 4.4 cm maximal diameter. This likely represents a residual or recurrent abscess. Similar collection was seen in this location previously, slightly smaller previously. This fluid collection extends inferiorly along the posterior psoas muscle consistent with psoas abscess. A left lower quadrant percutaneous drainage catheter  is placed into the left anterior pericolic gutter. There is stranding in this area without discrete residual collection. 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 An additional pigtail drainage catheter is present in the left lower quadrant without residual collection. No free air or free fluid in the abdomen. New right abdominal transverse colostomy. Stomach, small bowel, and colon are not abnormally distended. Broad-based anterior abdominal wall hernia containing fat. Pelvis: Prostate gland is enlarged. Bladder is decompressed and cannot be evaluated. No evidence of diverticulitis. No free or loculated pelvic fluid collections. No destructive bone lesions. IMPRESSION: There is a left subdiaphragmatic abscess measuring 4.4 cm, slightly larger than on previous study. Abscess continues down along the posterior paraspinous muscles to the left psoas muscle consistent with psoas abscess. Appearance is  similar to previous study. 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 Right abdominal transverse colostomy. Electronically Signed   By: Lucienne Capers M.D.   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 The patient subsequently underwent multiple percutaneous drainage catheter re-positioning, up sizing and placements, most recently a CT-guided percutaneous drainage catheter placement into residual fluid collection within the left iliopsoas on 04/14/2015. Of note, patient also underwent small-bowel follow-through 04/06/2015 without evidence of enteric leak. Patient returns today for repeat abdominal CT and percutaneous drainage catheter management. Patient reports no output from any of his percutaneous drainage catheter for the past week. No fever or chills. The patient continues to flush all of this percutaneous drainage catheters. EXAM: CT ABDOMEN AND PELVIS WITH CONTRAST TECHNIQUE: Multidetector CT imaging of the abdomen and pelvis was performed using the standard protocol following bolus administration of intravenous contrast. CONTRAST:  162mL ISOVUE-300 IOPAMIDOL (ISOVUE-300) INJECTION 61% COMPARISON:  CT-guided percutaneous drainage catheter placement - 04/14/2015 ; CT abdomen pelvis - 04/12/2015; small bowel follow-through -04/06/2015 FINDINGS: Unchanged positioning of the 4 percutaneous drainage catheters including new left-sided retroperitoneal percutaneous drainage catheter. There has been complete resolution of previously noted contiguous collection along the left iliopsoas musculature and about the medial aspect of the spleen post percutaneous drainage catheter placement. No new  definable/drainable fluid collections. Stable sequela of left-sided hemicolectomy with end colostomy noted within the right mid abdomen. Moderate colonic stool burden without evidence of enteric obstruction. Normal appearance of the terminal ileum and retrocecal appendix. No pneumoperitoneum, pneumatosis or portal venous gas. Normal hepatic contour. Unchanged approximately 0.8 cm hypo attenuating lesion within the dome of the right lobe of the liver, too small likely characterize of again favored to represent a hepatic cyst. Normal appearance of the gallbladder given degree of distention. No radiopaque gallstones. No intra extrahepatic bili duct dilatation. No ascites. There is symmetric enhancement of the bilateral kidneys. No definite renal stones this postcontrast examination. No urinary obstruction. There is a minimal amount of likely body habitus related perinephric stranding. No urinary obstruction. Normal appearance of the bilateral adrenal glands, pancreas and spleen. Scattered minimal amount of mixed calcified and noncalcified atherosclerotic plaque within a normal caliber abdominal aorta. The major branch vessels of the abdominal aorta appear patent on this non CTA examination. No bulky retroperitoneal, mesenteric, pelvic or inguinal lymphadenopathy. Normal appearance of the pelvic organs. Normal appearance of the  urinary bladder given degree distention. No free fluid in the pelvic cul-de-sac. Limited visualization of the lower thorax demonstrates unchanged small peripherally enhancing left-sided pleural effusion with associated left basilar heterogeneous opacities favored to represent atelectasis. No new focal airspace opacities. Borderline cardiomegaly.  No pericardial effusion. No acute or aggressive osseous abnormalities. Mild diffuse body wall anasarca. IMPRESSION: 1. Complete resolution of previously noted continuous abscess along the lateral aspect the left iliopsoas musculature extending to abut  the medial aspect of the spleen post percutaneous drainage catheter placement. Additional percutaneous drainage catheters are unchanged in positioning. Currently there are no definable/drainable fluid collections within the abdomen or pelvis. 2. Stable sequela of left hemicolectomy with end colostomy. No evidence of enteric obstruction. 3. Unchanged small peripherally enhancing left-sided pleural effusion. PLAN: Given the patient's markedly complicated postoperative course, the decision was made to maintain all percutaneous drainage catheters in place for 1 more week. If the patient continues to experience no output from the percutaneous drainage catheters, the percutaneous drainage catheters will be removed during next scheduled clinic visit next week. Repeat CT imaging will only be obtained if the patient were to experience new or recurrent output from any of the existing percutaneous drains. Above findings and plan of care were discussed with referring surgeon, Dr. Donne Hazel. Electronically Signed   By: Sandi Mariscal M.D.   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 Due to persistent leaking around the percutaneous drainage catheter, the patient underwent a successful fluoroscopic guided percutaneous drainage catheter repositioning on 03/02/2015 with subsequent percutaneous drainage catheter exchange, repositioning and placements on 03/12/2015. The patient returns to the Interventional Radiology Clinic today for percutaneous drainage catheter management. The patient continues to flush all 3 existing percutaneous drainage catheters. The patient states that he is experiencing nearly 400 cc of purulent output one of the percutaneous drainage catheters per day, with  bearing output from the other 2 percutaneous drainage catheters. Patient states he continues to experience a minimal amount of purulent discharge surrounding the entrance site of the 3 percutaneous drainage catheter. EXAM: CT ABDOMEN AND PELVIS WITH CONTRAST TECHNIQUE: Multidetector CT imaging of the abdomen and pelvis was performed using the standard protocol following bolus administration of intravenous contrast. CONTRAST:  130mL ISOVUE-300 IOPAMIDOL (ISOVUE-300) INJECTION 61% COMPARISON:  CT abdomen pelvis - 01/28/2015; 02/12/2015; 02/22/2015; 03/12/2015; 03/26/2015; CT-guided percutaneous drainage catheter placement - 01/29/2015; fluoroscopic guided percutaneous drainage catheter injection - 03/12/2015; 03/02/2015; fluoroscopic guided percutaneous drainage catheter injection and placement - 03/03/2015 FINDINGS: Stable sequela of left hemicolectomy with end colostomy. Grossly unchanged approximately 3.9 x 3.7 cm mesenteric fat containing peristomal hernia. Moderate colonic stool burden without evidence of enteric obstruction. Normal appearance of the terminal ileum and retrocecal appendix. No pneumoperitoneum, pneumatosis or portal venous gas. Unchanged positioning of 3 percutaneous drainage catheters all of which enter from the same entrance site within in the anterior lateral aspect of the left abdomen. There has been interval resolution of previously noted scattered loculated foci of subcutaneous emphysema about the left pericolic gutter. The approximately 3.2 x 2.0 cm peripherally enhancing fluid collection about the cranial aspect of the tail of the spleen pancreas is grossly unchanged measuring 2.0 x 3.2 cm (coronal image 68, series 601). The peripherally enhancing fluid collection adjacent to the the lateral aspect of the left  iliopsoas muscle is unchanged to minimally decreased in size in interval, currently measuring 2.7 x 5.0 x 10.1 cm (image 44, series 3; sagittal image 124, series 602) previously, 2.7  x 5.6 x 11.6 cm. The air and fluid containing collection within the left upper abdominal quadrant adjacent to the medial aspect of the spleen is grossly unchanged, measuring 3.2 x 4.4 cm (representative image 21, series 3). No new definable/drainable fluid collections within the abdomen or pelvis. ___________________________________________________________________ Normal hepatic contour. Unchanged punctate (approximately 0.7 cm) hypoattenuating lesion within the dome of the right lobe of the liver, grossly unchanged since the 12/1928 01/2014 examination while too small to adequately characterize is favored to represent a hepatic cyst. Normal appearance of the gallbladder given degree distention. No radiopaque gallstones. No intra extrahepatic of the duct dilatation. No ascites. There is symmetric enhancement of the bilateral kidneys. No definite renal stones on this postcontrast examination. No discrete renal lesions. No urinary obstruction. Normal appearance of the bilateral adrenal glands, pancreas and spleen. Normal caliber the abdominal aorta. The major branch vessels of the abdominal aorta appear patent on this non CTA examination. Scattered retroperitoneal porta hepatis lymph nodes are individually not enlarged by size criteria. No retroperitoneal, mesenteric, pelvic or inguinal lymphadenopathy. Normal appearance of the pelvic organs. Normal appearance of the urinary bladder given degree distention. No free fluid in the pelvic cul-de-sac. Limited visualization of lower thorax demonstrates grossly unchanged small left-sided pleural effusion with associated subsegmental atelectasis within in the left lower lobe with associated air bronchograms, unchanged. No new focal airspace opacities. Normal heart size.  No pericardial effusion. No acute or aggressive osseous abnormalities. Open midline abdominal wound, unchanged. There is a minimal on a subcutaneous edema, most conspicuous about the midline of the low back.  IMPRESSION: 1. Unchanged positioning of 3 percutaneous drainage catheters with no change significant change in the complex residual fluid collections located within the left upper abdominal quadrant and about the anterior lateral aspect of the left iliopsoas muscle as well as adjacent to the medial aspect of the spleen are grossly unchanged since the 02/2015 examination. No new or enlarging intra-abdominal fluid collections. 2. Stable sequela of left colectomy and end colostomy creation without evidence of enteric obstruction. 3. Unchanged small left-sided effusion with associated left basilar opacities and air bronchograms, atelectasis versus infiltrate. Electronically Signed   By: Sandi Mariscal M.D.   On: 03/26/2015 12:44   Dg Small Bowel  04/06/2015  CLINICAL DATA:  50 year old male status post left-sided colectomy and end colostomy for perforated ischemic colitis on 0000000 complicated by development of a rectal stump leak, post CT-guided percutaneous drainage catheter placement on 01/29/2015. Due to persistent leaking around the percutaneous drainage catheter, the patient underwent a successful fluoroscopic guided percutaneous drainage catheter repositioning on 03/02/2015 with subsequent percutaneous drainage catheter exchange, repositioning and placements on 03/12/2015. Patient presents for small bowel follow-through today trying to ascertain the source of continued high volume drain output. Recent drain injection on 03/26/2015. EXAM: SMALL BOWEL SERIES COMPARISON:  03/26/2015 and earlier. TECHNIQUE: Following ingestion of thin barium, serial small bowel images were obtained including spot views of the terminal ileum. FLUOROSCOPY TIME:  Radiation Exposure Index (as provided by the fluoroscopic device): 75 dGy If the device does not provide the exposure index: Fluoroscopy Time (in minutes and seconds):  1 minutes 0 seconds FINDINGS: Preprocedural scout view of the abdomen demonstrating 3 pigtail type  percutaneous drains in the left mid and lower abdomen stable in configuration since 03/26/2015. Normal bowel gas  pattern. Superimposed midline catheter which pertains to an external wound VAC. Single contrast small bowel follow-through was undertaken and the patient tolerated this well and without difficulty. After the first cup of barium contrast a KUB is obtained demonstrating normal opacification of the stomach and proximal small bowel loops. Normal midgut rotation. Oral contrast has reached left abdominal small bowel in proximity to the abdominal drains no contrast identified within the drains. The patient was then followed with serial overhead radiographs while he drank 2 more cups of oral contrast. Contrast transit time to the residual colon is about 1 hour. On all of these images no barium uptake by the drains is identified. Fluoroscopic evaluation of the small bowel and left abdominal drains was then performed. The loops in proximity to the drains appear normal in course caliber and mucosal pattern. No contrast uptake by the drains occurred. The terminal ileum also is normal. IMPRESSION: Negative small bowel follow-through. Small bowel loops in proximity to the left abdominal drains appear normal with no evidence of enteric fistula. Electronically Signed   By: Genevie Ann M.D.   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 Due to persistent leaking around the percutaneous drainage catheter, the patient underwent a successful fluoroscopic guided percutaneous drainage catheter repositioning on 03/02/2015 with subsequent percutaneous drainage catheter exchange, repositioning and placements on 03/12/2015. The patient returns to the Interventional Radiology Clinic  today for percutaneous drainage catheter management. The patient continues to flush all 3 existing percutaneous drainage catheters. The patient states that he is experiencing nearly 400 cc of purulent output one of the percutaneous drainage catheters per day, with bearing output from the other 2 percutaneous drainage catheters. Patient states he continues to experience a minimal amount of purulent discharge surrounding the entrance site of the 3 percutaneous drainage catheter. EXAM: ABSCESS INJECTION COMPARISON:  CT abdomen pelvis - earlier same day ; 01/17/2015; fluoroscopic guided percutaneous drainage catheter exchange - 03/02/2015; fluoroscopic guided percutaneous drainage catheter exchange, repositioning and placement - 03/03/2015; fluoroscopic guided percutaneous drainage catheter injection - 03/11/2014 CONTRAST:  20 cc Omnipaque 300, administered via the existing percutaneous drainage catheter FLUOROSCOPY TIME:  30 seconds (45 dGy) TECHNIQUE: The patient was positioned supine on the fluoroscopy table. A preprocedural spot fluoroscopic image was obtained of the left mid hemi abdomen existing percutaneous drainage catheters. Several spot fluoroscopic and radiographic images were obtained from the injection of a small amount of contrast via the middle percutaneous drainage catheter (the biliary drainage catheter) as multiple spot fluoroscopic and radiographic images were obtained. Images were reviewed and the procedure was terminated. The drainage catheters reconnected to a gravity bag. A dressing was placed. The patient tolerated the procedure well without immediate postprocedural complication. FINDINGS: Fluoroscopic guided injection of the middle percutaneous drainage catheter (the biliary drainage catheter) demonstrates opacification of the known abscess within the left upper abdominal quadrant adjacent to the medial aspect of the spleen. There is minimal opacification along the mid in caudal aspects of the  drainage catheter without definitive opacification of the node fluid collection about the left iliopsoas musculature. Excreted contrast is seen within in the left renal collecting system, ureter or urinary bladder. IMPRESSION: Fluoroscopic guided injection of the middle percutaneous drainage catheter (the biliary drainage catheter) demonstrates communication with the cranial aspect of the drain  and the known residual abscess within the left upper abdominal quadrant adjacent to the medial aspect of the spleen, grossly unchanged compared to fluoroscopic guided injection performed 03/12/2015. There is no definitive opacification of the known abscess/fluid collection adjacent left iliopsoas musculature. Electronically Signed   By: Sandi Mariscal M.D.   On: 03/26/2015 12:52   Dg Chest Port 1 View  04/16/2015  CLINICAL DATA:  Status post right PICC placement today. Multiple intra-abdominal abscesses. Initial encounter. EXAM: PORTABLE CHEST 1 VIEW COMPARISON:  Single view of the chest 04/12/2015. FINDINGS: A new right PICC is in place with the tip in the lower superior vena cava. Right IJ catheter is again seen. There is left worse than right basilar airspace disease and likely a small left effusion, unchanged. No pneumothorax. Cardiomegaly and vascular congestion noted. IMPRESSION: Tip of right PICC projects in the lower superior vena cava. No change in left worse than right basilar airspace disease and small left effusion. Cardiomegaly and vascular congestion. Electronically Signed   By: Inge Rise M.D.   On: 04/16/2015 14:12   Dg Chest Portable 1 View  04/12/2015  CLINICAL DATA:  Central line placement EXAM: PORTABLE CHEST 1 VIEW COMPARISON:  04/12/2015. FINDINGS: RIGHT IJ catheter tip cavoatrial junction. No pneumothorax. Cardiomegaly. Developing edema. IMPRESSION: RIGHT IJ catheter tip cavoatrial junction. No pneumothorax. Worsening aeration. Electronically Signed   By: Staci Righter M.D.   On: 04/12/2015  07:03   Dg Chest Port 1 View  04/12/2015  CLINICAL DATA:  Fever. EXAM: PORTABLE CHEST 1 VIEW COMPARISON:  01/29/2015 FINDINGS: Cardiac enlargement without vascular congestion or edema. Slight atelectasis in the left lung base. Improvement of the lung bases since previous study. Can't exclude small left pleural effusion. No pneumothorax. Mediastinal contours appear intact. IMPRESSION: Cardiac enlargement. Atelectasis and possible small effusion on the left base. Improvement since previous study. Electronically Signed   By: Lucienne Capers M.D.   On: 04/12/2015 00:40   Ct Image Guided Fluid Drain By Catheter  04/15/2015  INDICATION: 50 year old male with a history of ischemic colitis status post bowel resection and colostomy complicated by multiple intra-abdominal abscesses. He has undergone placement of multiple percutaneous drainage catheters previously. Most recent CT imaging which was performed for persistent abdominal pain and intermittent low-grade fevers demonstrates a residual un treated subdiaphragmatic abscess. This abscess appears to communicate via a thin channel with residual fluid tracking along the anterior aspect of the left psoas muscle. There was an abscess in this location on prior imaging. He presents today for drainage of these left retroperitoneal psoas and subdiaphragmatic collections. EXAM: CT IMAGE GUIDED FLUID DRAIN BY CATHETER MEDICATIONS: The patient is currently admitted to the hospital and receiving intravenous antibiotics. The antibiotics were administered within an appropriate time frame prior to the initiation of the procedure. ANESTHESIA/SEDATION: Fentanyl 150 mcg IV; Versed Three mg IV Moderate Sedation Time:  40 The patient was continuously monitored during the procedure by the interventional radiology nurse under my direct supervision. COMPLICATIONS: None immediate. PROCEDURE: Informed written consent was obtained from the patient after a thorough discussion of the  procedural risks, benefits and alternatives. All questions were addressed. Maximal Sterile Barrier Technique was utilized including caps, mask, sterile gowns, sterile gloves, sterile drape, hand hygiene and skin antiseptic. A timeout was performed prior to the initiation of the procedure. A planning axial CT scan was performed. The fluid collection anterior to the left psoas muscle was successfully identified. A suitable skin entry site was selected and marked. The region was sterilely  prepped and draped in standard fashion using chlorhexidine skin prep. Local anesthesia was attained by infiltration with 1% lidocaine. Using intermittent CT imaging, a 15 cm 18 gauge trocar needle was carefully advanced through the left lower quadrant abdominal wall and into the psoas fluid collection. A wire was then carefully manipulated in until it was successfully passed cephalad into the subdiaphragmatic collection. The skin tract was then serially dilated to 14 [place]. A Cook 57 [place] biliary drainage catheter was then advanced over the wire. Aspiration yields approximately 200 mL foul-smelling purulent fluid. Axial CT imaging demonstrates excellent position of the drainage catheter and near complete collapse of the residual abscess cavity. The catheter was connected to JP bulb suction and secured to the skin with 0 Prolene suture. IMPRESSION: Successful placement of a 94 French biliary drainage catheter so that it extends from the left psoas abscess cephalad into the left subdiaphragmatic abscess. Aspiration yields 200 mL foul-smelling purulent fluid. A sample was sent for culture. Signed, Criselda Peaches, MD [facility] Electronically Signed   By: Jacqulynn Cadet M.D.   On: 04/15/2015 09:54    Labs:  CBC:  Recent Labs  04/16/15 0918 04/17/15 1027 04/18/15 0600 04/19/15 0453  WBC 12.3* 11.9* 12.7* 12.8*  HGB 9.7* 9.5* 9.6* 9.3*  HCT 30.6* 30.6*  30.1* 30.4*  PLT 494* 526* 465* 467*    COAGS:  Recent Labs  04/13/15 1730  04/16/15 0918 04/17/15 1027 04/18/15 0600 04/19/15 0453  INR 2.62*  < > 1.91* 2.22* 2.01* 2.29*  APTT 49*  --   --   --   --   --   < > = values in this interval not displayed.  BMP:  Recent Labs  04/15/15 0605 04/16/15 0918 04/17/15 1027 04/18/15 0600  NA 140 141 141 139  K 3.7 3.2* 3.3* 3.7  CL 104 105 105 104  CO2 28 29 28 28   GLUCOSE 137* 119* 127* 83  BUN 21* 15 12 13   CALCIUM 9.3 9.0 9.1 8.9  CREATININE 1.45* 1.18 1.09 1.01  GFRNONAA 56* >60 >60 >60  GFRAA >60 >60 >60 >60    LIVER FUNCTION TESTS:  Recent Labs  04/11/15 2358 04/12/15 1430 04/14/15 0504 04/15/15 0605  BILITOT 0.4 0.3 0.6 0.4  AST 22 14* 13* 13*  ALT 44 31 23 18   ALKPHOS 86 68 59 55  PROT 8.4* 7.1 7.1 6.6  ALBUMIN 2.9* 2.3* 2.5* 2.3*    Assessment and Plan:  William Fischer is a 50 y.o. 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  The patient subsequently underwent multiple percutaneous drainage catheter repositioning, upsizing and placements, most recently, undergoing a CT-guided percutaneous drainage catheter placement into residual fluid collection about the left iliopsoas musculature on 04/04/2015.  The patient reports no output from the any of the existing percutaneous drainage catheters for the past week. The patient continues to flush the percutaneous drainage catheters. No fever or chills.  I'm extremely pleased to report that preceding CT scan of the abdomen and pelvis demonstrates complete resolution of the residual fluid collection about the left lateral aspect of the left iliopsoas musculature. Currently, there are no definable/treatable fluid collections within the  abdomen or pelvis.    Note, the patient has undergone previous fluoroscopic guided percutaneous drainage catheter injections  which have never demonstrated a definitive fistulous connection to any loop of small bowel or residual colon.  Additionally, patient underwent a small bowel follow through on 04/06/2015 which was also negative for the presence of a residual enteric leak.  Despite the lack of significant output from any of the percutaneous drainage catheters for the past week, I'm very hesitant to remove any of the drainage catheters given the patient's prolonged and extremely complex postoperative course. This concern was discussed with referring surgeon, Dr. Donne Hazel, and the decision was made to maintain the drainage catheters for 1 additional week to ensure complete resolution.  As such, the patient will return to the interventional radiology drain clinic next week.   If he continues to experience no output from any of the drains, the percutaneous drainage catheters will be removed at that time.  If the patient were to experience recurrent output from one of the percutaneous drainage catheters, a repeat CT scan of the abdomen and pelvis will be performed as well as potential fluoroscopic guided injection of the draining percutaneous drain.  Above concerns, findings and plan of care were discussed in detail with the patient who demonstrated an excellent understanding and is agreeable with the proposed plan of care.  A copy of this report was sent to the requesting provider on this date.  Electronically Signed: Sandi Mariscal 04/23/2015, 4:09 PM   I spent a total of 15 Minutes in face to face in clinical consultation, greater than 50% of which was counseling/coordinating care for percutaneous drainage catheter management

## 2015-04-24 ENCOUNTER — Ambulatory Visit: Payer: Self-pay | Admitting: Internal Medicine

## 2015-04-27 ENCOUNTER — Ambulatory Visit (INDEPENDENT_AMBULATORY_CARE_PROVIDER_SITE_OTHER): Payer: Medicaid Other

## 2015-04-27 DIAGNOSIS — Z5181 Encounter for therapeutic drug level monitoring: Secondary | ICD-10-CM

## 2015-04-27 DIAGNOSIS — Z7901 Long term (current) use of anticoagulants: Secondary | ICD-10-CM

## 2015-04-27 LAB — POCT INR: INR: 2.7

## 2015-04-29 ENCOUNTER — Ambulatory Visit
Admission: RE | Admit: 2015-04-29 | Discharge: 2015-04-29 | Disposition: A | Discharge status: Home / Self Care | Payer: Medicaid Other | Source: Ambulatory Visit | Attending: Radiology | Admitting: Radiology

## 2015-04-29 DIAGNOSIS — B999 Unspecified infectious disease: Secondary | ICD-10-CM

## 2015-04-29 DIAGNOSIS — K651 Peritoneal abscess: Secondary | ICD-10-CM

## 2015-05-01 ENCOUNTER — Ambulatory Visit (INDEPENDENT_AMBULATORY_CARE_PROVIDER_SITE_OTHER): Payer: Medicaid Other

## 2015-05-01 DIAGNOSIS — Z7901 Long term (current) use of anticoagulants: Secondary | ICD-10-CM

## 2015-05-01 DIAGNOSIS — Z5181 Encounter for therapeutic drug level monitoring: Secondary | ICD-10-CM

## 2015-05-01 LAB — POCT INR: INR: 2.1

## 2015-05-08 ENCOUNTER — Ambulatory Visit (INDEPENDENT_AMBULATORY_CARE_PROVIDER_SITE_OTHER): Payer: Medicaid Other

## 2015-05-08 DIAGNOSIS — Z7901 Long term (current) use of anticoagulants: Secondary | ICD-10-CM

## 2015-05-08 DIAGNOSIS — Z5181 Encounter for therapeutic drug level monitoring: Secondary | ICD-10-CM

## 2015-05-08 LAB — POCT INR: INR: 2.6

## 2015-05-15 ENCOUNTER — Telehealth: Payer: Self-pay | Admitting: *Deleted

## 2015-05-15 NOTE — Telephone Encounter (Signed)
Danielle,. [place] RN contacted [facility]. Patient completed IV antibiotics 4/27.  Patient's drains removed by surgeon. CT completed earlier this month.  Patient anxious to have the PICC out, threatening to pull it out himself per nursing. Verbal order obtained fromDr. Baxter Flattery to pull PICC.  Order given to nursing and to [facility] at [facility]. Landis Gandy, RN

## 2015-05-19 ENCOUNTER — Ambulatory Visit (INDEPENDENT_AMBULATORY_CARE_PROVIDER_SITE_OTHER): Payer: Medicaid Other | Admitting: Cardiology

## 2015-05-19 DIAGNOSIS — Z5181 Encounter for therapeutic drug level monitoring: Secondary | ICD-10-CM

## 2015-05-19 DIAGNOSIS — Z7901 Long term (current) use of anticoagulants: Secondary | ICD-10-CM

## 2015-05-19 LAB — POCT INR: INR: 2.5

## 2015-05-21 ENCOUNTER — Inpatient Hospital Stay: Payer: Self-pay | Admitting: Internal Medicine

## 2015-06-02 ENCOUNTER — Ambulatory Visit (INDEPENDENT_AMBULATORY_CARE_PROVIDER_SITE_OTHER): Payer: Medicaid Other | Admitting: Cardiology

## 2015-06-02 DIAGNOSIS — Z7901 Long term (current) use of anticoagulants: Secondary | ICD-10-CM

## 2015-06-02 DIAGNOSIS — Z5181 Encounter for therapeutic drug level monitoring: Secondary | ICD-10-CM

## 2015-06-02 LAB — POCT INR: INR: 1.9

## 2015-06-04 ENCOUNTER — Inpatient Hospital Stay: Payer: Self-pay | Admitting: Internal Medicine

## 2015-06-17 ENCOUNTER — Ambulatory Visit: Payer: Medicaid Other | Admitting: Internal Medicine

## 2015-06-22 ENCOUNTER — Ambulatory Visit (INDEPENDENT_AMBULATORY_CARE_PROVIDER_SITE_OTHER): Payer: Medicaid Other | Admitting: Internal Medicine

## 2015-06-22 ENCOUNTER — Ambulatory Visit (INDEPENDENT_AMBULATORY_CARE_PROVIDER_SITE_OTHER): Payer: Medicaid Other | Admitting: Pharmacist

## 2015-06-22 ENCOUNTER — Encounter: Payer: Self-pay | Admitting: Internal Medicine

## 2015-06-22 VITALS — BP 102/76 | HR 86 | Ht 71.0 in | Wt 259.0 lb

## 2015-06-22 DIAGNOSIS — I482 Chronic atrial fibrillation, unspecified: Secondary | ICD-10-CM

## 2015-06-22 DIAGNOSIS — I5022 Chronic systolic (congestive) heart failure: Secondary | ICD-10-CM

## 2015-06-22 DIAGNOSIS — Z7901 Long term (current) use of anticoagulants: Secondary | ICD-10-CM

## 2015-06-22 DIAGNOSIS — I4891 Unspecified atrial fibrillation: Secondary | ICD-10-CM

## 2015-06-22 DIAGNOSIS — Z5181 Encounter for therapeutic drug level monitoring: Secondary | ICD-10-CM

## 2015-06-22 LAB — POCT INR: INR: 1.7

## 2015-06-22 MED ORDER — DILTIAZEM HCL ER COATED BEADS 180 MG PO CP24: 180.0000 mg | ORAL_CAPSULE | Freq: Every day | ORAL | Status: DC

## 2015-06-22 MED ORDER — FUROSEMIDE 40 MG PO TABS: 40.0000 mg | ORAL_TABLET | Freq: Every day | ORAL | Status: DC | PRN

## 2015-06-22 NOTE — Progress Notes (Signed)
Electrophysiology Office Note   Date:  06/22/2015   ID:  CALVON LOVINGER, DOB 05-02-1965, MRN [id number]  PCP:  No PCP Per Patient  Cardiologist:  none Primary Electrophysiologist: Thompson Grayer, MD    Chief Complaint  Patient presents with  . Atrial Fibrillation     History of Present Illness: ELDRIGE Fischer is a 50 y.o. male who presents today for electrophysiology evaluation.   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  He had life saving colonic resection by Dr Donne Hazel and continues to recover. He is likely to have had an embolism related to afib at that time, though he reported compliance with pradaxa.  He has therefore been switched to coumadin for long term anticoagulation.   Today, he denies symptoms of palpitations, chest pain, shortness of breath, orthopnea, PND, lower extremity edema, claudication, dizziness, presyncope, syncope, bleeding, or neurologic sequela. The patient is tolerating medications without difficulties and is otherwise without complaint today.    Past Medical History  Diagnosis Date  . Chicken pox as a child  . Migraine 06/29/2011  . Obesity 06/29/2011  . Depression with anxiety 06/29/2011  . Atrial fibrillation ([place])     admx 11/13 with acute sCHF in setting of RVR  => a. failed DCCV x 2; b. Pradaxa started;  c. failed sotalol  . Chronic systolic heart failure ([place])     a. echo 11/13: Ef 40-45%, diff HK, mod MR, mod LAE, mild RVE, mod RAE, small effusion;   b. TEE 11/13:  EF 35-40%, no LAA clot; Echo 2/14 shows normal EF  . Cardiomyopathy ([place])     likely tachy mediated in setting of AF with RVR  . Snoring     patient needs sleep study - has declined  . Allergy to IVP dye   . Chronic anticoagulation     Pradaxa   Past Surgical History  Procedure Laterality Date  . Tee without cardioversion  11/30/2011    Procedure: TRANSESOPHAGEAL ECHOCARDIOGRAM (TEE);  Surgeon: Thayer Headings, MD;  Location: [place];  Service:  Cardiovascular;  Laterality: N/A;  . Cardioversion  11/30/2011    Procedure: CARDIOVERSION;  Surgeon: Thayer Headings, MD;  Location: [place];  Service: Cardiovascular;  Laterality: N/A;  . Cardioversion  12/03/2011    Procedure: CARDIOVERSION;  Surgeon: Jolaine Artist, MD;  Location: [place];  Service: Cardiovascular;  Laterality: N/A;  . Laparotomy N/A 01/17/2015    Procedure: EXPLORATORY LAPAROTOMY WITH LEFT COLECTOMY AND COLOSTOMY;  Surgeon: Rolm Bookbinder, MD;  Location: [place];  Service: General;  Laterality: N/A;     Current Outpatient Prescriptions  Medication Sig Dispense Refill  . acetaminophen (TYLENOL) 500 MG tablet Take 500-1,000 mg by mouth every 6 (six) hours as needed for mild pain or headache.    . digoxin (LANOXIN) 0.125 MG tablet Take 1 tablet (0.125 mg total) by mouth daily. 30 tablet 11  . diltiazem (CARDIZEM CD) 120 MG 24 hr capsule Take 1 capsule (120 mg total) by mouth daily. 30 capsule 11  . furosemide (LASIX) 40 MG tablet Take 1 tablet (40 mg total) by mouth daily as needed for edema.    [name] lisinopril (PRINIVIL,ZESTRIL) 5 MG tablet Take 1 tablet (5 mg total) by mouth daily. 30 tablet 11  . metoprolol (LOPRESSOR) 100 MG tablet Take 1 tablet (100 mg total) by mouth 2 (two) times daily. 60 tablet 6  . neomycin-bacitracin-polymyxin (NEOSPORIN) OINT Apply 1 application topically daily. For abdominal wound    .  warfarin (COUMADIN) 5 MG tablet Take 1 tablet (5 mg total) by mouth daily. 30 tablet 1   No current facility-administered medications for this visit.    Allergies:   Contrast media   Social History:  The patient  reports that he quit smoking about a year ago. His smoking use included Cigarettes. He has a 20 pack-year smoking history. He has never used smokeless tobacco. He reports that he does not drink alcohol or use illicit drugs.   Family History:  The patient's  family history includes Alcohol abuse in his father and paternal grandfather; Aneurysm in  his father; Cancer in his maternal grandmother and paternal grandmother; Diabetes in his maternal grandfather and paternal grandmother; Hypertension in his father and mother; Parkinsonism in his maternal grandfather.    ROS:  Please see the history of present illness.   All other systems are reviewed and negative.    PHYSICAL EXAM: VS:  BP 102/76 mmHg  Pulse 86  Ht 5\' 11"  (1.803 m)  Wt 259 lb (117.482 kg)  BMI 36.14 kg/m2 , BMI Body mass index is 36.14 kg/(m^2). GEN: Well nourished, well developed, in no acute distress HEENT: normal Neck: no JVD, carotid bruits, or masses Cardiac: iRRR; no murmurs, rubs, or gallops,no edema  Respiratory:  clear to auscultation bilaterally, normal work of breathing GI: soft, healing midline wound, ostomy in place MS: no deformity or atrophy Skin: warm and dry  Neuro:  Strength and sensation are intact Psych: euthymic mood, full affect  EKG:  EKG is ordered today. The ekg ordered today shows afib, V rate 86 bpm, nonspecific ST/T changes   Recent Labs: 01/26/2015: B Natriuretic Peptide 301.9* 04/15/2015: ALT 18 04/18/2015: BUN 13; Creatinine, Ser 1.01; Magnesium 2.0; Potassium 3.7; Sodium 139 04/19/2015: Hemoglobin 9.3*; Platelets 467*    Lipid Panel     Component Value Date/Time   CHOL 149 07/16/2013 1412   TRIG 156* 01/17/2015 1923   HDL 46.20 07/16/2013 1412   CHOLHDL 3 07/16/2013 1412   VLDL 16.0 07/16/2013 1412   LDLCALC 87 07/16/2013 1412     Wt Readings from Last 3 Encounters:  06/22/15 259 lb (117.482 kg)  04/21/15 241 lb 6.4 oz (109.498 kg)  04/17/15 256 lb 13.4 oz (116.5 kg)      Other studies Reviewed: Additional studies/ records that were reviewed today include: hospital records, notes from [facility], labs, prior echo   ASSESSMENT AND PLAN:  1.  Longstanding persistent afib Continue rate control long term Increase diltiazem CD 180mg  daily Continue coumadin long term  2. Nonischemic CM His EF recovered with rate  control previously.  I am not reluctant to use/ titrate diltiazem in this patient.  3. Obesity Weight loss advised He has declined sleep study   F/u with Richardson Dopp in six weeks  I will see again in 3 months   Current medicines are reviewed at length with the patient today.   The patient does not have concerns regarding his medicines.  The following changes were made today:  none  Signed, Thompson Grayer, MD  06/22/2015 3:06 PM     [facility] [address] [place] [address] [phone] (office) [phone] (fax)

## 2015-06-22 NOTE — Patient Instructions (Signed)
Medication Instructions:  Your physician has recommended you make the following change in your medication:  1) Increase Cardizem to 180 mg daily     Labwork: None ordered   Testing/Procedures: None ordered   Follow-Up: Your physician recommends that you schedule a follow-up appointment in: 6 weeks with Richardson Dopp, PA and 3 months with Dr Rayann Heman   Any Other Special Instructions Will Be Listed Below (If Applicable).     If you need a refill on your cardiac medications before your next appointment, please call your pharmacy.

## 2015-07-03 ENCOUNTER — Other Ambulatory Visit: Payer: Self-pay | Admitting: Internal Medicine

## 2015-07-03 ENCOUNTER — Other Ambulatory Visit: Payer: Self-pay | Admitting: *Deleted

## 2015-07-03 MED ORDER — WARFARIN SODIUM 5 MG PO TABS: 5.0000 mg | ORAL_TABLET | ORAL | Status: DC

## 2015-07-13 ENCOUNTER — Ambulatory Visit (INDEPENDENT_AMBULATORY_CARE_PROVIDER_SITE_OTHER): Payer: Medicaid Other | Admitting: Pharmacist

## 2015-07-13 DIAGNOSIS — I4891 Unspecified atrial fibrillation: Secondary | ICD-10-CM | POA: Diagnosis not present

## 2015-07-13 DIAGNOSIS — Z7901 Long term (current) use of anticoagulants: Secondary | ICD-10-CM | POA: Diagnosis not present

## 2015-07-13 DIAGNOSIS — I482 Chronic atrial fibrillation, unspecified: Secondary | ICD-10-CM

## 2015-07-13 DIAGNOSIS — Z5181 Encounter for therapeutic drug level monitoring: Secondary | ICD-10-CM

## 2015-07-13 LAB — POCT INR: INR: 2.1

## 2015-07-25 ENCOUNTER — Emergency Department (HOSPITAL_COMMUNITY): Payer: Self-pay

## 2015-07-25 ENCOUNTER — Encounter (HOSPITAL_COMMUNITY): Payer: Self-pay | Admitting: Emergency Medicine

## 2015-07-25 ENCOUNTER — Inpatient Hospital Stay (HOSPITAL_COMMUNITY)
Admission: EM | Admit: 2015-07-25 | Discharge: 2015-07-29 | DRG: 871 | Disposition: A | Discharge status: Home / Self Care | Payer: Self-pay | Attending: Internal Medicine | Admitting: Internal Medicine

## 2015-07-25 DIAGNOSIS — Z933 Colostomy status: Secondary | ICD-10-CM

## 2015-07-25 DIAGNOSIS — A047 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: Secondary | ICD-10-CM | POA: Diagnosis present

## 2015-07-25 DIAGNOSIS — I429 Cardiomyopathy, unspecified: Secondary | ICD-10-CM | POA: Diagnosis present

## 2015-07-25 DIAGNOSIS — Z79899 Other long term (current) drug therapy: Secondary | ICD-10-CM

## 2015-07-25 DIAGNOSIS — IMO0002 Reserved for concepts with insufficient information to code with codable children: Secondary | ICD-10-CM

## 2015-07-25 DIAGNOSIS — F418 Other specified anxiety disorders: Secondary | ICD-10-CM | POA: Diagnosis present

## 2015-07-25 DIAGNOSIS — B9689 Other specified bacterial agents as the cause of diseases classified elsewhere: Secondary | ICD-10-CM

## 2015-07-25 DIAGNOSIS — I482 Chronic atrial fibrillation, unspecified: Secondary | ICD-10-CM

## 2015-07-25 DIAGNOSIS — Z862 Personal history of diseases of the blood and blood-forming organs and certain disorders involving the immune mechanism: Secondary | ICD-10-CM

## 2015-07-25 DIAGNOSIS — Z6838 Body mass index (BMI) 38.0-38.9, adult: Secondary | ICD-10-CM

## 2015-07-25 DIAGNOSIS — R6520 Severe sepsis without septic shock: Secondary | ICD-10-CM

## 2015-07-25 DIAGNOSIS — R7303 Prediabetes: Secondary | ICD-10-CM | POA: Diagnosis present

## 2015-07-25 DIAGNOSIS — Z9049 Acquired absence of other specified parts of digestive tract: Secondary | ICD-10-CM

## 2015-07-25 DIAGNOSIS — Z91041 Radiographic dye allergy status: Secondary | ICD-10-CM

## 2015-07-25 DIAGNOSIS — K651 Peritoneal abscess: Secondary | ICD-10-CM | POA: Diagnosis present

## 2015-07-25 DIAGNOSIS — I4891 Unspecified atrial fibrillation: Secondary | ICD-10-CM | POA: Diagnosis present

## 2015-07-25 DIAGNOSIS — F329 Major depressive disorder, single episode, unspecified: Secondary | ICD-10-CM

## 2015-07-25 DIAGNOSIS — Z7901 Long term (current) use of anticoagulants: Secondary | ICD-10-CM

## 2015-07-25 DIAGNOSIS — A419 Sepsis, unspecified organism: Principal | ICD-10-CM | POA: Diagnosis present

## 2015-07-25 DIAGNOSIS — I5022 Chronic systolic (congestive) heart failure: Secondary | ICD-10-CM | POA: Diagnosis present

## 2015-07-25 DIAGNOSIS — A0471 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, recurrent: Secondary | ICD-10-CM | POA: Diagnosis present

## 2015-07-25 DIAGNOSIS — Z87891 Personal history of nicotine dependence: Secondary | ICD-10-CM

## 2015-07-25 LAB — PROTIME-INR
INR: 1.93 — AB (ref 0.00–1.49)
Prothrombin Time: 22 seconds — ABNORMAL HIGH (ref 11.6–15.2)

## 2015-07-25 LAB — CBC WITH DIFFERENTIAL/PLATELET
BASOS ABS: 0 10*3/uL (ref 0.0–0.1)
Basophils Relative: 0 %
EOS PCT: 1 %
Eosinophils Absolute: 0.1 10*3/uL (ref 0.0–0.7)
HEMATOCRIT: 46.9 % (ref 39.0–52.0)
Hemoglobin: 15.5 g/dL (ref 13.0–17.0)
Lymphocytes Relative: 9 %
Lymphs Abs: 1.3 10*3/uL (ref 0.7–4.0)
MCH: 30.4 pg (ref 26.0–34.0)
MCHC: 33 g/dL (ref 30.0–36.0)
MCV: 92 fL (ref 78.0–100.0)
Monocytes Absolute: 1.1 10*3/uL — ABNORMAL HIGH (ref 0.1–1.0)
Monocytes Relative: 8 %
NEUTROS ABS: 12 10*3/uL — AB (ref 1.7–7.7)
NEUTROS PCT: 82 %
PLATELETS: 418 10*3/uL — AB (ref 150–400)
RBC: 5.1 MIL/uL (ref 4.22–5.81)
RDW: 13 % (ref 11.5–15.5)
WBC: 14.4 10*3/uL — AB (ref 4.0–10.5)

## 2015-07-25 LAB — URINALYSIS, ROUTINE W REFLEX MICROSCOPIC
Bilirubin Urine: NEGATIVE
GLUCOSE, UA: NEGATIVE mg/dL
Hgb urine dipstick: NEGATIVE
Ketones, ur: NEGATIVE mg/dL
Nitrite: NEGATIVE
PROTEIN: NEGATIVE mg/dL
Specific Gravity, Urine: 1.02 (ref 1.005–1.030)
pH: 5 (ref 5.0–8.0)

## 2015-07-25 LAB — URINE MICROSCOPIC-ADD ON: RBC / HPF: NONE SEEN RBC/hpf (ref 0–5)

## 2015-07-25 LAB — COMPREHENSIVE METABOLIC PANEL
ALT: 22 U/L (ref 17–63)
ANION GAP: 9 (ref 5–15)
AST: 18 U/L (ref 15–41)
Albumin: 3.3 g/dL — ABNORMAL LOW (ref 3.5–5.0)
Alkaline Phosphatase: 88 U/L (ref 38–126)
BILIRUBIN TOTAL: 0.5 mg/dL (ref 0.3–1.2)
BUN: 17 mg/dL (ref 6–20)
CO2: 27 mmol/L (ref 22–32)
Calcium: 9.9 mg/dL (ref 8.9–10.3)
Chloride: 97 mmol/L — ABNORMAL LOW (ref 101–111)
Creatinine, Ser: 1.06 mg/dL (ref 0.61–1.24)
GFR calc Af Amer: >60 mL/min (ref >60)
Glucose, Bld: 138 mg/dL — ABNORMAL HIGH (ref 65–99)
POTASSIUM: 4.3 mmol/L (ref 3.5–5.1)
Sodium: 133 mmol/L — ABNORMAL LOW (ref 135–145)
TOTAL PROTEIN: 8.4 g/dL — AB (ref 6.5–8.1)

## 2015-07-25 LAB — I-STAT CG4 LACTIC ACID, ED
LACTIC ACID, VENOUS: 1.42 mmol/L (ref 0.5–1.9)
LACTIC ACID, VENOUS: 2.73 mmol/L — AB (ref 0.5–1.9)

## 2015-07-25 MED ORDER — ACETAMINOPHEN 650 MG RE SUPP: 650.0000 mg | Freq: Four times a day (QID) | RECTAL | Status: DC | PRN

## 2015-07-25 MED ORDER — ACETAMINOPHEN 325 MG PO TABS: 650.0000 mg | ORAL_TABLET | Freq: Four times a day (QID) | ORAL | Status: DC | PRN

## 2015-07-25 MED ORDER — PIPERACILLIN-TAZOBACTAM 3.375 G IVPB 30 MIN
3.3750 g | Freq: Once | INTRAVENOUS | Status: AC
  Administered 2015-07-25: 3.375 g via INTRAVENOUS
  Filled 2015-07-25: qty 50

## 2015-07-25 MED ORDER — LISINOPRIL 5 MG PO TABS
5.0000 mg | ORAL_TABLET | Freq: Every day | ORAL | Status: DC
  Administered 2015-07-26 – 2015-07-29 (×4): 5 mg via ORAL
  Filled 2015-07-25 (×4): qty 1

## 2015-07-25 MED ORDER — ONDANSETRON HCL 4 MG PO TABS: 4.0000 mg | ORAL_TABLET | Freq: Four times a day (QID) | ORAL | Status: DC | PRN

## 2015-07-25 MED ORDER — DIPHENHYDRAMINE HCL 50 MG/ML IJ SOLN
50.0000 mg | Freq: Once | INTRAMUSCULAR | Status: AC
  Administered 2015-07-25: 50 mg via INTRAVENOUS
  Filled 2015-07-25: qty 1

## 2015-07-25 MED ORDER — METOPROLOL TARTRATE 100 MG PO TABS
100.0000 mg | ORAL_TABLET | Freq: Two times a day (BID) | ORAL | Status: DC
  Administered 2015-07-26 – 2015-07-27 (×2): 100 mg via ORAL
  Filled 2015-07-25 (×2): qty 1
  Filled 2015-07-25: qty 4

## 2015-07-25 MED ORDER — DIGOXIN 125 MCG PO TABS
0.1250 mg | ORAL_TABLET | Freq: Every day | ORAL | Status: DC
  Administered 2015-07-26 – 2015-07-29 (×4): 0.125 mg via ORAL
  Filled 2015-07-25 (×4): qty 1

## 2015-07-25 MED ORDER — DILTIAZEM HCL ER COATED BEADS 180 MG PO CP24
180.0000 mg | ORAL_CAPSULE | Freq: Every day | ORAL | Status: DC
  Administered 2015-07-26 – 2015-07-29 (×4): 180 mg via ORAL
  Filled 2015-07-25 (×4): qty 1

## 2015-07-25 MED ORDER — SODIUM CHLORIDE 0.9 % IV BOLUS (SEPSIS)
1000.0000 mL | Freq: Once | INTRAVENOUS | Status: AC
  Administered 2015-07-25: 1000 mL via INTRAVENOUS

## 2015-07-25 MED ORDER — METHYLPREDNISOLONE SODIUM SUCC 125 MG IJ SOLR
125.0000 mg | Freq: Once | INTRAMUSCULAR | Status: AC
  Administered 2015-07-25: 125 mg via INTRAVENOUS
  Filled 2015-07-25: qty 2

## 2015-07-25 MED ORDER — BACITRACIN-NEOMYCIN-POLYMYXIN OINTMENT TUBE
1.0000 "application " | TOPICAL_OINTMENT | Freq: Every day | CUTANEOUS | Status: DC
  Administered 2015-07-29: 1 via TOPICAL
  Filled 2015-07-25: qty 15

## 2015-07-25 MED ORDER — PIPERACILLIN-TAZOBACTAM 3.375 G IVPB
3.3750 g | Freq: Three times a day (TID) | INTRAVENOUS | Status: DC
  Administered 2015-07-26 – 2015-07-29 (×10): 3.375 g via INTRAVENOUS
  Filled 2015-07-25 (×13): qty 50

## 2015-07-25 MED ORDER — SODIUM CHLORIDE 0.9 % IV BOLUS (SEPSIS)
500.0000 mL | Freq: Once | INTRAVENOUS | Status: AC
  Administered 2015-07-25: 500 mL via INTRAVENOUS

## 2015-07-25 MED ORDER — DIATRIZOATE MEGLUMINE & SODIUM 66-10 % PO SOLN
ORAL | Status: AC
  Filled 2015-07-25: qty 30

## 2015-07-25 MED ORDER — ONDANSETRON HCL 4 MG/2ML IJ SOLN: 4.0000 mg | Freq: Four times a day (QID) | INTRAMUSCULAR | Status: DC | PRN

## 2015-07-25 MED ORDER — SODIUM CHLORIDE 0.9% FLUSH
3.0000 mL | Freq: Two times a day (BID) | INTRAVENOUS | Status: DC
  Administered 2015-07-26 – 2015-07-29 (×5): 3 mL via INTRAVENOUS

## 2015-07-25 MED ORDER — INSULIN ASPART 100 UNIT/ML ~~LOC~~ SOLN
0.0000 [IU] | Freq: Three times a day (TID) | SUBCUTANEOUS | Status: DC
  Administered 2015-07-26: 2 [IU] via SUBCUTANEOUS
  Administered 2015-07-26: 3 [IU] via SUBCUTANEOUS
  Administered 2015-07-26 – 2015-07-28 (×2): 2 [IU] via SUBCUTANEOUS
  Filled 2015-07-25: qty 1

## 2015-07-25 MED ORDER — IOPAMIDOL (ISOVUE-300) INJECTION 61%
INTRAVENOUS | Status: AC
  Administered 2015-07-25: 100 mL
  Filled 2015-07-25: qty 100

## 2015-07-25 MED ORDER — ONDANSETRON HCL 4 MG PO TABS
4.0000 mg | ORAL_TABLET | Freq: Four times a day (QID) | ORAL | Status: DC | PRN
Start: 2015-07-25 — End: 2015-07-26

## 2015-07-25 MED ORDER — HYDROMORPHONE HCL 1 MG/ML IJ SOLN
1.0000 mg | Freq: Once | INTRAMUSCULAR | Status: AC
  Administered 2015-07-25: 1 mg via INTRAVENOUS
  Filled 2015-07-25: qty 1

## 2015-07-25 MED ORDER — SODIUM CHLORIDE 0.9 % IV BOLUS (SEPSIS)
1000.0000 mL | Freq: Once | INTRAVENOUS | Status: AC
Start: 2015-07-25 — End: 2015-07-25
  Administered 2015-07-25: 1000 mL via INTRAVENOUS

## 2015-07-25 MED ORDER — SODIUM CHLORIDE 0.9% FLUSH
3.0000 mL | Freq: Two times a day (BID) | INTRAVENOUS | Status: DC
  Administered 2015-07-26 – 2015-07-29 (×5): 3 mL via INTRAVENOUS

## 2015-07-25 NOTE — Progress Notes (Signed)
Pharmacy Antibiotic Note  William Fischer is a 50 y.o. male s/p left colectomy/colostomy in 12/2014,now with multiple drains, presents to the ED with left lower abdominal pain that has been worsening over last 10 days. He will begin zosyn for possible intra abdominal infection/sepsis. Renal function wnl.  Plan: 1) Zosyn 3.375g IV q8 (4 hour infusion) 2) Follow renal function, cultures, LOT     Temp (24hrs), Avg:98.8 F (37.1 C), Min:98.8 F (37.1 C), Max:98.8 F (37.1 C)   Recent Labs Lab 07/25/15 1441 07/25/15 1914  WBC 14.4*  --   CREATININE 1.06  --   LATICACIDVEN 1.42 2.73*    CrCl cannot be calculated (Unknown ideal weight.).    Allergies  Allergen Reactions  . Contrast Media [Iodinated Diagnostic Agents] Rash    a diffuse macular rash after CTA chest  Pt was premedicated with 125mg  IV Solumedrol, 50mg  IV Benadryl 1 hr prior to CTexam, and tolerated procedure without any difficulties. 11/28/11 Also same pre med protocol observed on 02/22/15 and pt tolerated the procedure well    Antimicrobials this admission: 7/8 Zosyn>>  Dose adjustments this admission: n/a  Microbiology results: 7/8 blood x 1 >> 7/8 urine >>  Thank you for allowing pharmacy to be a part of this patient's care.  Deboraha Sprang 07/25/2015 8:18 PM

## 2015-07-25 NOTE — ED Notes (Signed)
Patient transported to CT 

## 2015-07-25 NOTE — H&P (Signed)
Date: 07/25/2015               Patient Name:  William Fischer MRN: [id number]  DOB: 1965-04-14 Age / Sex: 50 y.o., male   PCP: No Pcp Per Patient         Medical Service: Internal Medicine Teaching Service         Attending Physician: Dr. Bartholomew Crews, MD    First Contact: Dr. [name]  Pager: [phone]  Second Contact: Dr. 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   He began experiencing LLQ abdominal pain 2 weeks ago which is worsening. The pain is constant over the LLQ site of his removed pigtail drain, 10/10, sharp, and radiates to a previous surgical site around his umbilicus. It feels similar to the pain he experienced with previous abscess at that site. He also noticed that his abdomen became distended, erythematous, and gave off heat over his LLQ. His colostomy drainage has changed from brown and solid to a green liquid, he denies seeing any blood and states he usually he empties the bag about once per day and he has noticed no change in that frequency. He has noticed an urge to pass stool for the last 3-4 weeks which he has not experienced since prior to his colostomy surgery, when he goes to the restroom he notices a white foul smelling liquid, he denies any blood in the bowel movement. He states he has also been feeling febrile for the last 2 weeks. He has decreased appetite which has come and gone for months. His last meal was a pack of crackers yesterday. He denies N/V.   He was admitted 3/26 to 4/2 and discharged on IV Ceftriaxone and po Flagyl for 4 weeks which he completed. At last f/u with surgery on 06/24/2015 he was doing very well and told  that he would be ready for reversal 09/2015.  Meds: Current Facility-Administered Medications  Medication Dose Route Frequency Provider Last Rate Last Dose  . acetaminophen (TYLENOL) tablet 650 mg  650 mg Oral Q6H PRN Burgess Estelle, MD       Or  . acetaminophen (TYLENOL) suppository 650 mg  650 mg Rectal Q6H PRN Burgess Estelle, MD      . acetaminophen (TYLENOL) tablet 650 mg  650 mg Oral Q6H PRN Burgess Estelle, MD       Or  . acetaminophen (TYLENOL) suppository 650 mg  650 mg Rectal Q6H PRN Burgess Estelle, MD      . Derrill Memo ON 07/26/2015] digoxin (LANOXIN) tablet 0.125 mg  0.125 mg Oral Daily Burgess Estelle, MD      . Derrill Memo ON 07/26/2015] diltiazem (CARDIZEM CD) 24 hr capsule 180 mg  180 mg Oral Daily Burgess Estelle, MD      . Derrill Memo ON 07/26/2015] insulin aspart (novoLOG) injection 0-15 Units  0-15 Units Subcutaneous TID WC Burgess Estelle, MD      . Derrill Memo ON 07/26/2015] lisinopril (PRINIVIL,ZESTRIL) tablet 5 mg  5 mg Oral Daily Burgess Estelle, MD      . metoprolol tartrate (LOPRESSOR) tablet 100 mg  100 mg Oral BID Burgess Estelle, MD      . [  START ON 07/26/2015] neomycin-bacitracin-polymyxin (NEOSPORIN) ointment 1 application  1 application Topical Daily Burgess Estelle, MD      . ondansetron (ZOFRAN) tablet 4 mg  4 mg Oral Q6H PRN Burgess Estelle, MD       Or  . ondansetron (ZOFRAN) injection 4 mg  4 mg Intravenous Q6H PRN Burgess Estelle, MD      . ondansetron (ZOFRAN) tablet 4 mg  4 mg Oral Q6H PRN Burgess Estelle, MD       Or  . ondansetron (ZOFRAN) injection 4 mg  4 mg Intravenous Q6H PRN Burgess Estelle, MD      . Derrill Memo ON 07/26/2015] piperacillin-tazobactam (ZOSYN) IVPB 3.375 g  3.375 g Intravenous Q8H Otilio Miu, RPH      . sodium chloride flush (NS) 0.9 % injection 3 mL  3 mL Intravenous Q12H Burgess Estelle, MD      . sodium chloride flush (NS) 0.9 % injection 3 mL  3 mL Intravenous Q12H Burgess Estelle, MD       Current Outpatient Prescriptions  Medication Sig Dispense Refill  . digoxin (LANOXIN)  0.125 MG tablet Take 1 tablet (0.125 mg total) by mouth daily. 30 tablet 11  . diltiazem (CARDIZEM CD) 180 MG 24 hr capsule Take 1 capsule (180 mg total) by mouth daily. 90 capsule 3  . feeding supplement (BOOST / RESOURCE BREEZE) LIQD Take 1 Container by mouth daily.    [name] FIBER SELECT GUMMIES CHEW Chew 1 tablet by mouth daily.    . furosemide (LASIX) 40 MG tablet Take 1 tablet (40 mg total) by mouth daily as needed for edema. (Patient taking differently: Take 40 mg by mouth daily. ) 30 tablet 11  . lisinopril (PRINIVIL,ZESTRIL) 5 MG tablet Take 1 tablet (5 mg total) by mouth daily. 30 tablet 11  . metoprolol (LOPRESSOR) 100 MG tablet Take 1 tablet (100 mg total) by mouth 2 (two) times daily. 60 tablet 6  . Multiple Vitamin (MULTIVITAMIN WITH MINERALS) TABS tablet Take 1 tablet by mouth daily.    [name] neomycin-bacitracin-polymyxin (NEOSPORIN) OINT Apply 1 application topically daily. For abdominal wound (Patient taking differently: Apply 1 application topically every 3 (three) days. For abdominal wound)    . warfarin (COUMADIN) 5 MG tablet Take 1 tablet (5 mg total) by mouth as directed. (Patient taking differently: Take 2.5-5 mg by mouth daily at 6 PM. Take 1/2 tablet (2.5 mg) by mouth on Fridays, take 1 tablet (5 mg) on all other days of the week) 30 tablet 3    Allergies: Allergies as of 07/25/2015 - Review Complete 07/25/2015  Allergen Reaction Noted  . Contrast media [iodinated diagnostic agents] Rash 11/28/2011   Past Medical History  Diagnosis Date  . Chicken pox as a child  . Migraine 06/29/2011  . Obesity 06/29/2011  . Depression with anxiety 06/29/2011  . Atrial fibrillation ([place])     admx 11/13 with acute sCHF in setting of RVR  => a. failed DCCV x 2; b. Pradaxa started;  c. failed sotalol  . Chronic systolic heart failure ([place])     a. echo 11/13: Ef 40-45%, diff HK, mod MR, mod LAE, mild RVE, mod RAE, small effusion;   b. TEE 11/13:  EF 35-40%, no LAA clot; Echo 2/14 shows normal EF    . Cardiomyopathy ([place])     likely tachy mediated in setting of AF with RVR  . Snoring     patient needs sleep study - has declined  . Allergy to IVP dye   .  Chronic anticoagulation     Pradaxa    Family History: HTN (mother & father), Cancer (maternal and paternal grandmothers)  Social History: Denies smoking or alcohol use. He lives on his own and was receiving help with PICC abx from [facility] which was stopped recently as he was doing very well.   Review of Systems: A complete ROS was negative except as per HPI.   Physical Exam: Blood pressure 132/115, pulse 102, temperature 98.9 F (37.2 C), temperature source Oral, resp. rate 18, SpO2 95 %. General: AAOx3, NAD, lying in hospital bed  Cardiovascular: fast rate and irregular rhythm, no murmurs, clicks, or rubs  Pulmonary/Chest: Clear to auscultation bilaterally, no wheezes, rales, or rhonchi. Abdominal: BS+, resonant to percussion,  erythematous and distended LLQ, surgical scars present in LLQ and right umbilicus, colostomy bag draining green liquid, no blood  Extremities: No lower extremity edema bilaterally Skin: Warm, dry and intact. No rashes or erythema.  EKG: reviewed 06/2015 EKG shows Afib with rates in the 80s  CXR: Mild enlargement of cardiac silhouette, Mild bronchitic changes with LEFT basilar atelectasis.  Assessment & Plan by Problem: Active Problems:   Abdominal abscess ([place])   Sepsis [facility])   50 yo M with recurrent intraabdominal abscess with history of perforated ischemic colitis s/p left colectomy and end colostomy in Dec 2016, who presents with ~10 days of abd pain , change in colostomy output color, and CT showing abscesses again.   1. Multiple recurrent abdominal abscess:  meets Sepsis criteria. CT showing multiple intra-abdominal abscesses with one along an old drain to SQ tissue. Has noticed change in the colostomy output and color. 2nd lactic acid elevated.  Received 4 L NS in ER.  Surgery  consulted and recommended him to be NPO and to hold Coumadin. They will decide tomorrow whether to take him to the OR or to consult IR. BCx pending. Also testing for Cdif given he received recent antibiotics. Recently admitted in march for the same reason and Biliary drain placed into the left psoas abscess and subdiaphragmatic abscess with 200 mL purulent drainage on 3/28.Gram stain with GPC. Initally on Vancomycin, Zosyn, and Anidulafungin for 4-5 days and sent home with IV Ceftriaxone and PO Flagyl which he completed. He last saw surgery on 06/24/2015 and was doing well at that time, the plan was to reverse his colostomy.   - admit to stepdown   - consult with surgery, f/u their recommendations tomorrow  - NPO   - Holding coumadin   - On Zosyn   - F/u blood cx  - f/u urine cx   - testing for cdiff given the recent abx hx   2. Chronic afib on digoxin, metoprolol, and diltiazem . Also on Coumadin. INR of 1.93. Holding Coumadin per surgery saw his cardiologist in 06/2015, he increased diltiazem to 180 mg and recommended long term coumadin   - admit on telemetry   - continue metoprolol, diltiazem, and digoxin  3. prediabetes last A1c 6.1  - on SSI moderate  4. Chronic systolic CHF last echo [id number] shows EF 45-50%. On  metoprolol, lisinopril, digoxin. He should not be on diltiazem given his hx of cardiomyopathy but is for rate control.   5. Depression   - continue lexapro   6. History of normocytic anemia during prior admissions- Hgb today 15.5, MCV 92.   - monitor CBC   F received 4 L NS in ER  E  N NPO   DVT ppx - on Coumadin  at home, holding per surgery   Code status FULL    Dispo: Admit patient to Inpatient with expected length of stay greater than 2 midnights.  Signed: Ledell Noss, MD 07/25/2015, 11:18 PM  Pager: [phone]

## 2015-07-25 NOTE — ED Notes (Addendum)
To Ed via private vehicle, had abd surgery recently -- having "complications from this-- left side of abd bloated, red, warm area to lower abd also.-- " has had multiple drains, repeat surgery- colostomy placed.  Pt has been having productive cough -- causing pain in left side of back and left abd.  Pt stool in colostomy bag is green, passing white-clear mucus through rectum -- with foul odor.

## 2015-07-25 NOTE — ED Notes (Signed)
Notified pt that a second IV is ordered.

## 2015-07-25 NOTE — ED Provider Notes (Signed)
CSN: [id number]     Arrival date & time 07/25/15  1352 History   First MD Initiated Contact with Patient 07/25/15 1615     Chief Complaint  Patient presents with  . Post-op Problem  . r/o sepsis      (Consider location/radiation/quality/duration/timing/severity/associated sxs/prior Treatment) HPI  50 year old male presents with left lower abdominal pain that has been worsening for last 10 days. Is constant and severe. Patient has had multiple abdominal surgeries since December [id number] with complications from his colostomy. Patient recently had abdominal drains removed a couple months ago. Feels like his abdomen is swollen just on the left lower abdomen. He has also noticed some redness over the last couple days. He is currently on warfarin for A. fib. Has had a little bit of a cough over the last couple days and this exacerbates his pain. Pain is not in his back but is in his left flank. No nausea or vomiting. Has also had loose green stool out of his colostomy over the last few days. No urinary symptoms.  Past Medical History  Diagnosis Date  . Chicken pox as a child  . Migraine 06/29/2011  . Obesity 06/29/2011  . Depression with anxiety 06/29/2011  . Atrial fibrillation ([place])     admx 11/13 with acute sCHF in setting of RVR  => a. failed DCCV x 2; b. Pradaxa started;  c. failed sotalol  . Chronic systolic heart failure ([place])     a. echo 11/13: Ef 40-45%, diff HK, mod MR, mod LAE, mild RVE, mod RAE, small effusion;   b. TEE 11/13:  EF 35-40%, no LAA clot; Echo 2/14 shows normal EF  . Cardiomyopathy ([place])     likely tachy mediated in setting of AF with RVR  . Snoring     patient needs sleep study - has declined  . Allergy to IVP dye   . Chronic anticoagulation     Pradaxa   Past Surgical History  Procedure Laterality Date  . Tee without cardioversion  11/30/2011    Procedure: TRANSESOPHAGEAL ECHOCARDIOGRAM (TEE);  Surgeon: Thayer Headings, MD;  Location: [place];  Service:  Cardiovascular;  Laterality: N/A;  . Cardioversion  11/30/2011    Procedure: CARDIOVERSION;  Surgeon: Thayer Headings, MD;  Location: [place];  Service: Cardiovascular;  Laterality: N/A;  . Cardioversion  12/03/2011    Procedure: CARDIOVERSION;  Surgeon: Jolaine Artist, MD;  Location: [place];  Service: Cardiovascular;  Laterality: N/A;  . Laparotomy N/A 01/17/2015    Procedure: EXPLORATORY LAPAROTOMY WITH LEFT COLECTOMY AND COLOSTOMY;  Surgeon: Rolm Bookbinder, MD;  Location: [facility];  Service: General;  Laterality: N/A;   Family History  Problem Relation Age of Onset  . Hypertension Mother   . Hypertension Father   . Aneurysm Father   . Alcohol abuse Father   . Cancer Maternal Grandmother     brain  . Diabetes Maternal Grandfather     type 2  . Parkinsonism Maternal Grandfather   . Cancer Paternal Grandmother     breast  . Diabetes Paternal Grandmother   . Alcohol abuse Paternal Grandfather    Social History  Substance Use Topics  . Smoking status: Former Smoker -- 1.00 packs/day for 20 years    Types: Cigarettes    Quit date: 07/10/2014  . Smokeless tobacco: Never Used  . Alcohol Use: No    Review of Systems  Constitutional: Negative for fever.  Respiratory: Positive for cough. Negative for shortness of breath.  Gastrointestinal: Positive for abdominal pain, diarrhea and abdominal distention. Negative for nausea and vomiting.  Genitourinary: Positive for flank pain. Negative for dysuria.  Musculoskeletal: Negative for back pain.  All other systems reviewed and are negative.     Allergies  Contrast media  Home Medications   Prior to Admission medications   Medication Sig Start Date End Date Taking? Authorizing Provider  acetaminophen (TYLENOL) 500 MG tablet Take 500-1,000 mg by mouth every 6 (six) hours as needed for mild pain or headache.    Historical Provider, MD  digoxin (LANOXIN) 0.125 MG tablet Take 1 tablet (0.125 mg total) by mouth daily. 04/21/15    Liliane Shi, PA-C  diltiazem (CARDIZEM CD) 180 MG 24 hr capsule Take 1 capsule (180 mg total) by mouth daily. 06/22/15   Thompson Grayer, MD  furosemide (LASIX) 40 MG tablet Take 1 tablet (40 mg total) by mouth daily as needed for edema. 06/22/15   Thompson Grayer, MD  lisinopril (PRINIVIL,ZESTRIL) 5 MG tablet Take 1 tablet (5 mg total) by mouth daily. 04/21/15   Liliane Shi, PA-C  metoprolol (LOPRESSOR) 100 MG tablet Take 1 tablet (100 mg total) by mouth 2 (two) times daily. 03/23/15   Sherran Needs, NP  neomycin-bacitracin-polymyxin (NEOSPORIN) OINT Apply 1 application topically daily. For abdominal wound 04/19/15   Riccardo Dubin, MD  warfarin (COUMADIN) 5 MG tablet Take 1 tablet (5 mg total) by mouth as directed. 07/03/15   Thompson Grayer, MD   BP 100/56 mmHg  Pulse 90  Temp(Src) 98.8 F (37.1 C) (Oral)  Resp 20  SpO2 100% Physical Exam  Constitutional: He is oriented to person, place, and time. He appears well-developed and well-nourished.  HENT:  Head: Normocephalic and atraumatic.  Right Ear: External ear normal.  Left Ear: External ear normal.  Nose: Nose normal.  Eyes: Right eye exhibits no discharge. Left eye exhibits no discharge.  Neck: Neck supple.  Cardiovascular: Normal rate, regular rhythm, normal heart sounds and intact distal pulses.   Pulmonary/Chest: Effort normal and breath sounds normal.  Abdominal: Soft. There is tenderness.    Musculoskeletal: He exhibits no edema.  Neurological: He is alert and oriented to person, place, and time.  Skin: Skin is warm and dry.  Nursing note and vitals reviewed.   ED Course  Procedures (including critical care time) Labs Review Labs Reviewed  COMPREHENSIVE METABOLIC PANEL - Abnormal; Notable for the following:    Sodium 133 (*)    Chloride 97 (*)    Glucose, Bld 138 (*)    Total Protein 8.4 (*)    Albumin 3.3 (*)    All other components within normal limits  CBC WITH DIFFERENTIAL/PLATELET - Abnormal; Notable for the following:     WBC 14.4 (*)    Platelets 418 (*)    Neutro Abs 12.0 (*)    Monocytes Absolute 1.1 (*)    All other components within normal limits  URINALYSIS, ROUTINE W REFLEX MICROSCOPIC (NOT AT [facility]) - Abnormal; Notable for the following:    APPearance CLOUDY (*)    Leukocytes, UA TRACE (*)    All other components within normal limits  PROTIME-INR - Abnormal; Notable for the following:    Prothrombin Time 22.0 (*)    INR 1.93 (*)    All other components within normal limits  URINE MICROSCOPIC-ADD ON - Abnormal; Notable for the following:    Squamous Epithelial / LPF 0-5 (*)    Bacteria, UA FEW (*)    Casts HYALINE  CASTS (*)    All other components within normal limits  I-STAT CG4 LACTIC ACID, ED - Abnormal; Notable for the following:    Lactic Acid, Venous 2.73 (*)    All other components within normal limits  CULTURE, BLOOD (ROUTINE X 2)  CULTURE, BLOOD (ROUTINE X 2)  URINE CULTURE  COMPREHENSIVE METABOLIC PANEL  CBC  PROTIME-INR  I-STAT CG4 LACTIC ACID, ED    Imaging Review Dg Chest 2 View  07/25/2015  CLINICAL DATA:  Multiple abdominal surgeries with colon resection and complications over past several months, fever, LEFT lower quadrant pain, shortness of breath for 8 days, history chronic systolic heart failure, cardiomyopathy, atrial fibrillation EXAM: CHEST  2 VIEW COMPARISON:  04/16/2015 FINDINGS: Enlargement of cardiac silhouette. Mediastinal contours and pulmonary vascularity normal. Peribronchial thickening with LEFT basilar atelectasis. Chronic accentuation of interstitial markings little changed. No definite acute infiltrate, pleural effusion or pneumothorax. Osseous demineralization. IMPRESSION: Mild enlargement of cardiac silhouette. Mild bronchitic changes with LEFT basilar atelectasis. Electronically Signed   By: Lavonia Dana M.D.   On: 07/25/2015 14:42   Ct Abdomen Pelvis W Contrast  07/25/2015  CLINICAL DATA:  Left abdominal pain/ swelling, evaluate for abscess EXAM: CT  ABDOMEN AND PELVIS WITH CONTRAST TECHNIQUE: Multidetector CT imaging of the abdomen and pelvis was performed using the standard protocol following bolus administration of intravenous contrast. CONTRAST:  142mL ISOVUE-300 IOPAMIDOL (ISOVUE-300) INJECTION 61% COMPARISON:  04/23/2015 FINDINGS: Lower chest: Mild patchy subpleural opacity in the left lower lobe, favored to reflect atelectasis/scarring. Associated trace left pleural fluid versus pleural thickening. Hepatobiliary: Liver is notable for a 12 mm cyst in the posterior right hepatic lobe (series 2/image 27), unchanged. Gallbladder is unremarkable. No intrahepatic or extrahepatic ductal dilatation. Pancreas: Within normal limits. Spleen: Within normal limits. Adrenals/Urinary Tract: Adrenal glands are within normal limits. Kidneys are within normal limits.  No hydronephrosis. Bladder is mildly thick-walled although underdistended. Stomach/Bowel: Stomach is within normal limits. 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 No evidence of bowel obstruction. Normal appendix (series 2/image 59). Vascular/Lymphatic: No evidence of abdominal aortic aneurysm. No suspicious abdominopelvic lymphadenopathy. Reproductive: Prostate is unremarkable. Other: No abdominopelvic ascites. Interval removal of three prior pigtail drainage catheters. 2.7 x 3.0 x 6.8 cm fluid collection/abscess adjacent to bowel in the left lower abdomen, anterior to the left psoas muscle (series 2/image 68), at the site of a prior pigtail drainage catheter. Additional inflammatory changes extending superiorly to a thin, vertical 1.9 x 1.4 x 6.3 cm fluid collection in the left mid abdomen, anterior to the left kidney (series 2/ image 44), at the site of the prior pigtail drainage catheter. Additional inflammatory changes extending superiorly with a small foci of gas tracking adjacent to the pancreatic tail (series 2/ image 35). Inflammatory changes also extend  superiorly to the stomach (series 2/ image 31). From the initial collection, inflammatory changes also extend inferiorly to an elongated 2.3 x 8.2 x 2.7 cm fluid collection extending through the left lateral abdominal wall (series 2/image 32), extending to the skin surface (series 2/image 74), along a prior drain tract. These findings are new from prior CT. Diastases of the midline anterior abdominal wall. Musculoskeletal: Visualized osseous structures are within normal limits. IMPRESSION: 2.7 x 3.0 x 6.8 cm fluid collection/abscess in the left mid abdomen, anterior to the left psoas muscle and adjacent to bowel, at the site of a prior pigtail drainage catheter. Associated 2.3 x 8.2 x 2.7 cm fluid collection extending through the left  lateral abdominal wall to the skin surface, along a prior drain tract. Additional 1.9 x 1.4 x 6.3 cm fluid collection with associated tiny foci of gas in the left mid abdomen, anterior to the left kidney, at the site of a prior pigtail drainage catheter. Electronically Signed   By: Julian Hy M.D.   On: 07/25/2015 20:10   I have personally reviewed and evaluated these images and lab results as part of my medical decision-making.   EKG Interpretation None      CRITICAL CARE Performed by: Sherwood Gambler T   Total critical care time: 30 minutes  Critical care time was exclusive of separately billable procedures and treating other patients.  Critical care was necessary to treat or prevent imminent or life-threatening deterioration.  Critical care was time spent personally by me on the following activities: development of treatment plan with patient and/or surrogate as well as nursing, discussions with consultants, evaluation of patient's response to treatment, examination of patient, obtaining history from patient or surrogate, ordering and performing treatments and interventions, ordering and review of laboratory studies, ordering and review of radiographic  studies, pulse oximetry and re-evaluation of patient's condition.  MDM   Final diagnoses:  Severe sepsis ([place])  Intra-abdominal abscess ([place])    CT scan shows multiple abdominal abscesses. Discussed with Dr. Ninfa Linden who will come consult on patient. Internal medicine teaching service will admit patient. While in the ED his blood pressure has trended down into the 90s. Initial lactate negative but second, repeat lactate is elevated. Code sepsis called and patient was treated per sepsis protocol with IV antibiotics and fluids. Blood pressure has improved. Will admit to the step down unit.    Sherwood Gambler, MD 07/25/15 [phone]

## 2015-07-25 NOTE — Consult Note (Signed)
Reason for Consult: Multiple intra-abdominal abscesses Referring Physician: Dr. Sherwood Gambler  William Fischer is an 50 y.o. male.  HPI: This gentleman is well-known to Dr. Donne Hazel and [place]. He underwent emergency laparotomy in December 2016 for ischemic colitis. This resulted in a partial colectomy and colostomy. He has had an extensive difficult postoperative course with multiple intra-abdominal abscesses that have been drained percutaneously by interventional radiology. His last drains were removed in April of this year.  He has a complex history of A. fib on Coumadin. He reports increasing abdominal discomfort over the last several days. He has had no nausea or vomiting and reports that the ostomy has been working well. He presented today to the emergency department. A CT scan has been performed showing multiple intra-abdominal small abscesses with one tracking along an old drain tract to the subcutaneous tissue. He has been slightly hypotensive with an elevated lactic acid level.  Past Medical History  Diagnosis Date  . Chicken pox as a child  . Migraine 06/29/2011  . Obesity 06/29/2011  . Depression with anxiety 06/29/2011  . Atrial fibrillation ([place])     admx 11/13 with acute sCHF in setting of RVR  => a. failed DCCV x 2; b. Pradaxa started;  c. failed sotalol  . Chronic systolic heart failure ([place])     a. echo 11/13: Ef 40-45%, diff HK, mod MR, mod LAE, mild RVE, mod RAE, small effusion;   b. TEE 11/13:  EF 35-40%, no LAA clot; Echo 2/14 shows normal EF  . Cardiomyopathy ([place])     likely tachy mediated in setting of AF with RVR  . Snoring     patient needs sleep study - has declined  . Allergy to IVP dye   . Chronic anticoagulation     Pradaxa    Past Surgical History  Procedure Laterality Date  . Tee without cardioversion  11/30/2011    Procedure: TRANSESOPHAGEAL ECHOCARDIOGRAM (TEE);  Surgeon: Thayer Headings, MD;  Location: [facility];  Service: Cardiovascular;  Laterality:  N/A;  . Cardioversion  11/30/2011    Procedure: CARDIOVERSION;  Surgeon: Thayer Headings, MD;  Location: [facility];  Service: Cardiovascular;  Laterality: N/A;  . Cardioversion  12/03/2011    Procedure: CARDIOVERSION;  Surgeon: Jolaine Artist, MD;  Location: [facility];  Service: Cardiovascular;  Laterality: N/A;  . Laparotomy N/A 01/17/2015    Procedure: EXPLORATORY LAPAROTOMY WITH LEFT COLECTOMY AND COLOSTOMY;  Surgeon: Rolm Bookbinder, MD;  Location: [facility];  Service: General;  Laterality: N/A;    Family History  Problem Relation Age of Onset  . Hypertension Mother   . Hypertension Father   . Aneurysm Father   . Alcohol abuse Father   . Cancer Maternal Grandmother     brain  . Diabetes Maternal Grandfather     type 2  . Parkinsonism Maternal Grandfather   . Cancer Paternal Grandmother     breast  . Diabetes Paternal Grandmother   . Alcohol abuse Paternal Grandfather     Social History:  reports that he quit smoking about 12 months ago. His smoking use included Cigarettes. He has a 20 pack-year smoking history. He has never used smokeless tobacco. He reports that he does not drink alcohol or use illicit drugs.  Allergies:  Allergies  Allergen Reactions  . Contrast Media [Iodinated Diagnostic Agents] Rash    a diffuse macular rash after CTA chest  Pt was premedicated with 154m IV Solumedrol, 543mIV Benadryl 1 hr prior to CTexam,  and tolerated procedure without any difficulties. 11/28/11 Also same pre med protocol observed on 02/22/15 and pt tolerated the procedure well    Medications: I have reviewed the patient's current medications.  Results for orders placed or performed during the hospital encounter of 07/25/15 (from the past 48 hour(s))  Comprehensive metabolic panel     Status: Abnormal   Collection Time: 07/25/15  2:41 PM  Result Value Ref Range   Sodium 133 (L) 135 - 145 mmol/L   Potassium 4.3 3.5 - 5.1 mmol/L   Chloride 97 (L) 101 - 111 mmol/L   CO2 27 22 -  32 mmol/L   Glucose, Bld 138 (H) 65 - 99 mg/dL   BUN 17 6 - 20 mg/dL   Creatinine, Ser 1.06 0.61 - 1.24 mg/dL   Calcium 9.9 8.9 - 10.3 mg/dL   Total Protein 8.4 (H) 6.5 - 8.1 g/dL   Albumin 3.3 (L) 3.5 - 5.0 g/dL   AST 18 15 - 41 U/L   ALT 22 17 - 63 U/L   Alkaline Phosphatase 88 38 - 126 U/L   Total Bilirubin 0.5 0.3 - 1.2 mg/dL   GFR calc non Af Amer >60 >60 mL/min   GFR calc Af Amer >60 >60 mL/min    Comment: (NOTE) The eGFR has been calculated using the CKD EPI equation. This calculation has not been validated in all clinical situations. eGFR's persistently <60 mL/min signify possible Chronic Kidney Disease.    Anion gap 9 5 - 15  I-Stat CG4 Lactic Acid, ED     Status: None   Collection Time: 07/25/15  2:41 PM  Result Value Ref Range   Lactic Acid, Venous 1.42 0.5 - 1.9 mmol/L  CBC with Differential     Status: Abnormal   Collection Time: 07/25/15  2:41 PM  Result Value Ref Range   WBC 14.4 (H) 4.0 - 10.5 K/uL   RBC 5.10 4.22 - 5.81 MIL/uL   Hemoglobin 15.5 13.0 - 17.0 g/dL   HCT 46.9 39.0 - 52.0 %   MCV 92.0 78.0 - 100.0 fL   MCH 30.4 26.0 - 34.0 pg   MCHC 33.0 30.0 - 36.0 g/dL   RDW 13.0 11.5 - 15.5 %   Platelets 418 (H) 150 - 400 K/uL   Neutrophils Relative % 82 %   Neutro Abs 12.0 (H) 1.7 - 7.7 K/uL   Lymphocytes Relative 9 %   Lymphs Abs 1.3 0.7 - 4.0 K/uL   Monocytes Relative 8 %   Monocytes Absolute 1.1 (H) 0.1 - 1.0 K/uL   Eosinophils Relative 1 %   Eosinophils Absolute 0.1 0.0 - 0.7 K/uL   Basophils Relative 0 %   Basophils Absolute 0.0 0.0 - 0.1 K/uL  Urinalysis, Routine w reflex microscopic     Status: Abnormal   Collection Time: 07/25/15  6:55 PM  Result Value Ref Range   Color, Urine YELLOW YELLOW   APPearance CLOUDY (A) CLEAR   Specific Gravity, Urine 1.020 1.005 - 1.030   pH 5.0 5.0 - 8.0   Glucose, UA NEGATIVE NEGATIVE mg/dL   Hgb urine dipstick NEGATIVE NEGATIVE   Bilirubin Urine NEGATIVE NEGATIVE   Ketones, ur NEGATIVE NEGATIVE mg/dL    Protein, ur NEGATIVE NEGATIVE mg/dL   Nitrite NEGATIVE NEGATIVE   Leukocytes, UA TRACE (A) NEGATIVE  Protime-INR     Status: Abnormal   Collection Time: 07/25/15  6:55 PM  Result Value Ref Range   Prothrombin Time 22.0 (H) 11.6 - 15.2 seconds  INR 1.93 (H) 0.00 - 1.49  Urine microscopic-add on     Status: Abnormal   Collection Time: 07/25/15  6:55 PM  Result Value Ref Range   Squamous Epithelial / LPF 0-5 (A) NONE SEEN   WBC, UA 0-5 0 - 5 WBC/hpf   RBC / HPF NONE SEEN 0 - 5 RBC/hpf   Bacteria, UA FEW (A) NONE SEEN   Casts HYALINE CASTS (A) NEGATIVE   Urine-Other MUCOUS PRESENT   I-Stat CG4 Lactic Acid, ED     Status: Abnormal   Collection Time: 07/25/15  7:14 PM  Result Value Ref Range   Lactic Acid, Venous 2.73 (HH) 0.5 - 1.9 mmol/L   Comment NOTIFIED PHYSICIAN     Dg Chest 2 View  07/25/2015  CLINICAL DATA:  Multiple abdominal surgeries with colon resection and complications over past several months, fever, LEFT lower quadrant pain, shortness of breath for 8 days, history chronic systolic heart failure, cardiomyopathy, atrial fibrillation EXAM: CHEST  2 VIEW COMPARISON:  04/16/2015 FINDINGS: Enlargement of cardiac silhouette. Mediastinal contours and pulmonary vascularity normal. Peribronchial thickening with LEFT basilar atelectasis. Chronic accentuation of interstitial markings little changed. No definite acute infiltrate, pleural effusion or pneumothorax. Osseous demineralization. IMPRESSION: Mild enlargement of cardiac silhouette. Mild bronchitic changes with LEFT basilar atelectasis. Electronically Signed   By: Lavonia Dana M.D.   On: 07/25/2015 14:42   Ct Abdomen Pelvis W Contrast  07/25/2015  CLINICAL DATA:  Left abdominal pain/ swelling, evaluate for abscess EXAM: CT ABDOMEN AND PELVIS WITH CONTRAST TECHNIQUE: Multidetector CT imaging of the abdomen and pelvis was performed using the standard protocol following bolus administration of intravenous contrast. CONTRAST:  111m  ISOVUE-300 IOPAMIDOL (ISOVUE-300) INJECTION 61% COMPARISON:  04/23/2015 FINDINGS: Lower chest: Mild patchy subpleural opacity in the left lower lobe, favored to reflect atelectasis/scarring. Associated trace left pleural fluid versus pleural thickening. Hepatobiliary: Liver is notable for a 12 mm cyst in the posterior right hepatic lobe (series 2/image 27), unchanged. Gallbladder is unremarkable. No intrahepatic or extrahepatic ductal dilatation. Pancreas: Within normal limits. Spleen: Within normal limits. Adrenals/Urinary Tract: Adrenal glands are within normal limits. Kidneys are within normal limits.  No hydronephrosis. Bladder is mildly thick-walled although underdistended. Stomach/Bowel: Stomach is within normal limits. 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 No evidence of bowel obstruction. Normal appendix (series 2/image 59). Vascular/Lymphatic: No evidence of abdominal aortic aneurysm. No suspicious abdominopelvic lymphadenopathy. Reproductive: Prostate is unremarkable. Other: No abdominopelvic ascites. Interval removal of three prior pigtail drainage catheters. 2.7 x 3.0 x 6.8 cm fluid collection/abscess adjacent to bowel in the left lower abdomen, anterior to the left psoas muscle (series 2/image 68), at the site of a prior pigtail drainage catheter. Additional inflammatory changes extending superiorly to a thin, vertical 1.9 x 1.4 x 6.3 cm fluid collection in the left mid abdomen, anterior to the left kidney (series 2/ image 44), at the site of the prior pigtail drainage catheter. Additional inflammatory changes extending superiorly with a small foci of gas tracking adjacent to the pancreatic tail (series 2/ image 35). Inflammatory changes also extend superiorly to the stomach (series 2/ image 31). From the initial collection, inflammatory changes also extend inferiorly to an elongated 2.3 x 8.2 x 2.7 cm fluid collection extending through the left lateral  abdominal wall (series 2/image 32), extending to the skin surface (series 2/image 74), along a prior drain tract. These findings are new from prior CT. Diastases of the midline anterior abdominal wall. Musculoskeletal: Visualized osseous  structures are within normal limits. IMPRESSION: 2.7 x 3.0 x 6.8 cm fluid collection/abscess in the left mid abdomen, anterior to the left psoas muscle and adjacent to bowel, at the site of a prior pigtail drainage catheter. Associated 2.3 x 8.2 x 2.7 cm fluid collection extending through the left lateral abdominal wall to the skin surface, along a prior drain tract. Additional 1.9 x 1.4 x 6.3 cm fluid collection with associated tiny foci of gas in the left mid abdomen, anterior to the left kidney, at the site of a prior pigtail drainage catheter. Electronically Signed   By: Julian Hy M.D.   On: 07/25/2015 20:10    Review of Systems  All other systems reviewed and are negative.  Blood pressure 117/67, pulse 110, temperature 99.2 F (37.3 C), temperature source Oral, resp. rate 18, SpO2 94 %. Physical Exam  Constitutional: He is oriented to person, place, and time.  Obese, mildly distressed in appearance  HENT:  Head: Normocephalic and atraumatic.  Right Ear: External ear normal.  Left Ear: External ear normal.  Nose: Nose normal.  Mouth/Throat: Oropharynx is clear and moist.  Eyes: Conjunctivae are normal. Pupils are equal, round, and reactive to light. No scleral icterus.  Neck: Normal range of motion. Neck supple. No tracheal deviation present.  Cardiovascular: Normal rate, regular rhythm, normal heart sounds and intact distal pulses.   No murmur heard. Respiratory: Effort normal and breath sounds normal. No respiratory distress. He has no wheezes.  GI: Soft. There is tenderness. There is guarding.  His abdomen is obese with a large scar at the midline. His right-sided ostomy is productive. There is an area of redness and tenderness along the left  lower quadrant at a previous drain site.  Musculoskeletal: Normal range of motion. He exhibits no edema or tenderness.  Lymphadenopathy:    He has no cervical adenopathy.  Neurological: He is alert and oriented to person, place, and time.  Skin: Skin is warm. He is diaphoretic. No erythema. No pallor.  Psychiatric: His behavior is normal. Judgment normal.    Assessment/Plan: Multiple recurrent intra-abdominal abscesses with developing sepsis  There is recommended the patient be admitted to medicine or critical care medicine given the possible sepsis.  I will discuss his case with Dr. Donne Hazel in the morning. We will decide whether to have interventional radiology see if more drains can be placed or if the largest abscess can be drained in the operating room through the left lower quadrant of the abdominal wall. He should remain nothing by mouth and his Coumadin should be held.  I do not believe he needs an emergent laparotomy this evening.   William Fischer A 07/25/2015, 9:31 PM

## 2015-07-25 NOTE — ED Notes (Signed)
Dr. Ninfa Linden here to see pt.

## 2015-07-25 NOTE — ED Notes (Signed)
Lengthy conversation with pt about his understandable depression and frustration about his medical situation.

## 2015-07-26 DIAGNOSIS — I482 Chronic atrial fibrillation, unspecified: Secondary | ICD-10-CM | POA: Insufficient documentation

## 2015-07-26 LAB — COMPREHENSIVE METABOLIC PANEL
ALBUMIN: 2.6 g/dL — AB (ref 3.5–5.0)
ALK PHOS: 74 U/L (ref 38–126)
ALT: 17 U/L (ref 17–63)
AST: 13 U/L — ABNORMAL LOW (ref 15–41)
Anion gap: 5 (ref 5–15)
BILIRUBIN TOTAL: 0.7 mg/dL (ref 0.3–1.2)
BUN: 17 mg/dL (ref 6–20)
CALCIUM: 8.8 mg/dL — AB (ref 8.9–10.3)
CO2: 22 mmol/L (ref 22–32)
Chloride: 106 mmol/L (ref 101–111)
Creatinine, Ser: 0.93 mg/dL (ref 0.61–1.24)
GFR calc Af Amer: >60 mL/min (ref >60)
GFR calc non Af Amer: >60 mL/min (ref >60)
GLUCOSE: 181 mg/dL — AB (ref 65–99)
Potassium: 4.5 mmol/L (ref 3.5–5.1)
Sodium: 133 mmol/L — ABNORMAL LOW (ref 135–145)
TOTAL PROTEIN: 7 g/dL (ref 6.5–8.1)

## 2015-07-26 LAB — URINE CULTURE

## 2015-07-26 LAB — C DIFFICILE QUICK SCREEN W PCR REFLEX
C Diff antigen: POSITIVE — AB
C Diff toxin: NEGATIVE

## 2015-07-26 LAB — GLUCOSE, CAPILLARY
GLUCOSE-CAPILLARY: 142 mg/dL — AB (ref 65–99)
GLUCOSE-CAPILLARY: 135 mg/dL — AB (ref 65–99)
Glucose-Capillary: 135 mg/dL — ABNORMAL HIGH (ref 65–99)

## 2015-07-26 LAB — PROTIME-INR
INR: 2.25 — AB (ref 0.00–1.49)
PROTHROMBIN TIME: 24.6 s — AB (ref 11.6–15.2)

## 2015-07-26 LAB — MRSA PCR SCREENING: MRSA by PCR: NEGATIVE

## 2015-07-26 LAB — CBC
HCT: 41.2 % (ref 39.0–52.0)
HEMOGLOBIN: 13.3 g/dL (ref 13.0–17.0)
MCH: 30 pg (ref 26.0–34.0)
MCHC: 32.3 g/dL (ref 30.0–36.0)
MCV: 92.8 fL (ref 78.0–100.0)
Platelets: 353 10*3/uL (ref 150–400)
RBC: 4.44 MIL/uL (ref 4.22–5.81)
RDW: 13.1 % (ref 11.5–15.5)
WBC: 15.9 10*3/uL — ABNORMAL HIGH (ref 4.0–10.5)

## 2015-07-26 LAB — CBG MONITORING, ED: GLUCOSE-CAPILLARY: 175 mg/dL — AB (ref 65–99)

## 2015-07-26 LAB — CLOSTRIDIUM DIFFICILE BY PCR: CDIFFPCR: POSITIVE — AB

## 2015-07-26 MED ORDER — VANCOMYCIN 50 MG/ML ORAL SOLUTION
125.0000 mg | Freq: Four times a day (QID) | ORAL | Status: DC
  Administered 2015-07-26 – 2015-07-29 (×14): 125 mg via ORAL
  Filled 2015-07-26 (×16): qty 2.5

## 2015-07-26 MED ORDER — HYDROMORPHONE HCL 1 MG/ML IJ SOLN
1.0000 mg | INTRAMUSCULAR | Status: DC | PRN
  Administered 2015-07-26 – 2015-07-27 (×10): 1 mg via INTRAVENOUS
  Filled 2015-07-26 (×11): qty 1

## 2015-07-26 MED ORDER — DEXTROSE-NACL 5-0.45 % IV SOLN
INTRAVENOUS | Status: DC
  Administered 2015-07-26 – 2015-07-29 (×7): via INTRAVENOUS

## 2015-07-26 NOTE — ED Notes (Signed)
Paged MD as pt is reporting pain, requesting pain meds and only tylenol is ordered.  Await call back

## 2015-07-26 NOTE — Consult Note (Signed)
WOC ostomy consult note Stoma type/location: RLQ colostomy Stomal assessment/size: Red, moist, flush, 1 and 1/4 inch x 2 inches oval.  Os in center. Peristomal assessment: Intact, clear. Treatment options for stomal/peristomal skin: None indicated.  Skin is clear and intact. Output: Soft brown stool and flatus.  Ostomy pouching: 2pc. 2 and 1/4 inch pouching system with skin barrier ring.  Ideally, patient could wear a 2 and 3/4 inch pouching system, but her reports he has many at home and is hoping to be reconnected in the next few months.  Additionally, he has a great deal of abdominal hair and the smaller system allows him to pull less hair in removal.he changes the system every other day and is independent in this care. He has 2 pouches, skin barriers and skin barrier rings with him and I have provided 2 more of each for his convenience.   Education provided: None. Support provided for patient who is frustrated by the reoccurrence of another abscess, but grateful for the care of his [facility] surgeons, [place] Silos and [employer]. [facility] nursing team will not follow, but will remain available to this patient, the nursing and medical teams.  Please re-consult if needed. Thanks, Maudie Flakes, MSN, RN, [place], Arther Abbott  Pager# [phone]

## 2015-07-26 NOTE — ED Notes (Signed)
Pt moved to hospital bed for comfort.

## 2015-07-26 NOTE — Progress Notes (Signed)
Subjective: No real complaints today  Objective: Vital signs in last 24 hours: Temp:  [97.7 F (36.5 C)-99.2 F (37.3 C)] 97.7 F (36.5 C) (07/09 1200) Pulse Rate:  [69-110] 69 (07/09 1200) Resp:  [16-31] 18 (07/09 1200) BP: (85-132)/(54-115) 99/68 mmHg (07/09 1200) SpO2:  [91 %-100 %] 96 % (07/09 1200) Weight:  [120.203 kg (265 lb)] 120.203 kg (265 lb) (07/09 0915) Last BM Date: 07/25/15  Intake/Output from previous day: 07/08 0701 - 07/09 0700 In: 4000 [I.V.:4000] Out: 1500 [Urine:1500] Intake/Output this shift: Total I/O In: -  Out: 275 [Urine:275]  GI: soft nontender, llq with induration and some erythema, midline healed, stoma functional  Lab Results:   Recent Labs  07/25/15 1441 07/26/15 0548  WBC 14.4* 15.9*  HGB 15.5 13.3  HCT 46.9 41.2  PLT 418* 353   BMET  Recent Labs  07/25/15 1441 07/26/15 0548  NA 133* 133*  K 4.3 4.5  CL 97* 106  CO2 27 22  GLUCOSE 138* 181*  BUN 17 17  CREATININE 1.06 0.93  CALCIUM 9.9 8.8*   PT/INR  Recent Labs  07/25/15 1855 07/26/15 0548  LABPROT 22.0* 24.6*  INR 1.93* 2.25*   ABG No results for input(s): PHART, HCO3 in the last 72 hours.  Invalid input(s): PCO2, PO2  Studies/Results: Dg Chest 2 View  07/25/2015  CLINICAL DATA:  Multiple abdominal surgeries with colon resection and complications over past several months, fever, LEFT lower quadrant pain, shortness of breath for 8 days, history chronic systolic heart failure, cardiomyopathy, atrial fibrillation EXAM: CHEST  2 VIEW COMPARISON:  04/16/2015 FINDINGS: Enlargement of cardiac silhouette. Mediastinal contours and pulmonary vascularity normal. Peribronchial thickening with LEFT basilar atelectasis. Chronic accentuation of interstitial markings little changed. No definite acute infiltrate, pleural effusion or pneumothorax. Osseous demineralization. IMPRESSION: Mild enlargement of cardiac silhouette. Mild bronchitic changes with LEFT basilar atelectasis.  Electronically Signed   By: Lavonia Dana M.D.   On: 07/25/2015 14:42   Ct Abdomen Pelvis W Contrast  07/25/2015  CLINICAL DATA:  Left abdominal pain/ swelling, evaluate for abscess EXAM: CT ABDOMEN AND PELVIS WITH CONTRAST TECHNIQUE: Multidetector CT imaging of the abdomen and pelvis was performed using the standard protocol following bolus administration of intravenous contrast. CONTRAST:  153mL ISOVUE-300 IOPAMIDOL (ISOVUE-300) INJECTION 61% COMPARISON:  04/23/2015 FINDINGS: Lower chest: Mild patchy subpleural opacity in the left lower lobe, favored to reflect atelectasis/scarring. Associated trace left pleural fluid versus pleural thickening. Hepatobiliary: Liver is notable for a 12 mm cyst in the posterior right hepatic lobe (series 2/image 27), unchanged. Gallbladder is unremarkable. No intrahepatic or extrahepatic ductal dilatation. Pancreas: Within normal limits. Spleen: Within normal limits. Adrenals/Urinary Tract: Adrenal glands are within normal limits. Kidneys are within normal limits.  No hydronephrosis. Bladder is mildly thick-walled although underdistended. Stomach/Bowel: Stomach is within normal limits. 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 No evidence of bowel obstruction. Normal appendix (series 2/image 59). Vascular/Lymphatic: No evidence of abdominal aortic aneurysm. No suspicious abdominopelvic lymphadenopathy. Reproductive: Prostate is unremarkable. Other: No abdominopelvic ascites. Interval removal of three prior pigtail drainage catheters. 2.7 x 3.0 x 6.8 cm fluid collection/abscess adjacent to bowel in the left lower abdomen, anterior to the left psoas muscle (series 2/image 68), at the site of a prior pigtail drainage catheter. Additional inflammatory changes extending superiorly to a thin, vertical 1.9 x 1.4 x 6.3 cm fluid collection in the left mid abdomen, anterior to the left kidney (series 2/ image 44), at the site of  the prior pigtail  drainage catheter. Additional inflammatory changes extending superiorly with a small foci of gas tracking adjacent to the pancreatic tail (series 2/ image 35). Inflammatory changes also extend superiorly to the stomach (series 2/ image 31). From the initial collection, inflammatory changes also extend inferiorly to an elongated 2.3 x 8.2 x 2.7 cm fluid collection extending through the left lateral abdominal wall (series 2/image 32), extending to the skin surface (series 2/image 74), along a prior drain tract. These findings are new from prior CT. Diastases of the midline anterior abdominal wall. Musculoskeletal: Visualized osseous structures are within normal limits. IMPRESSION: 2.7 x 3.0 x 6.8 cm fluid collection/abscess in the left mid abdomen, anterior to the left psoas muscle and adjacent to bowel, at the site of a prior pigtail drainage catheter. Associated 2.3 x 8.2 x 2.7 cm fluid collection extending through the left lateral abdominal wall to the skin surface, along a prior drain tract. Additional 1.9 x 1.4 x 6.3 cm fluid collection with associated tiny foci of gas in the left mid abdomen, anterior to the left kidney, at the site of a prior pigtail drainage catheter. Electronically Signed   By: Julian Hy M.D.   On: 07/25/2015 20:10    Anti-infectives: Anti-infectives    Start     Dose/Rate Route Frequency Ordered Stop   07/26/15 1100  vancomycin (VANCOCIN) 50 mg/mL oral solution 125 mg     125 mg Oral 4 times daily 07/26/15 1053 08/09/15 0959   07/26/15 0500  piperacillin-tazobactam (ZOSYN) IVPB 3.375 g     3.375 g 12.5 mL/hr over 240 Minutes Intravenous Every 8 hours 07/25/15 2018     07/25/15 2015  piperacillin-tazobactam (ZOSYN) IVPB 3.375 g     3.375 g 100 mL/hr over 30 Minutes Intravenous  Once 07/25/15 2013 07/25/15 2150      Assessment/Plan: IA abscess, recurrent Drains have been out a while and was doing well until last week, more pressure in pelvis and more urgency.  His  inr was 2.25 today.  Will continue abx and let him eat today. Discussed not sure why he continues to have these.  We had previously done enema and sbft multiple scans.  Would prefer to drain again and start abx.  May need further evaluation but does not want to do that now.  I have discussed case with ir. If ok with medical team consider vitamin k today to decrease inr  [facility] 07/26/2015

## 2015-07-26 NOTE — Progress Notes (Signed)
Subjective: William Fischer is feeling depressed and frustrated today about his situation. He endorses significant 8/10 pain in his abdomen that is being controlled by the PRN pain meds. He feels like he's spent more time in the hospital than at home these last few months and that nothing can be done to stop these recurrent infections. He thinks about giving up and letting himself die naturally sometimes. He is concerned that surgery will not fix anything and will further delay his planned colostomy revision.  EKG this AM showed ongoing Afib with improved rate, no ST changes. C. Diff antigen and PCR positive.  Objective: Vital signs in last 24 hours: Filed Vitals:   07/26/15 0800 07/26/15 0821 07/26/15 0830 07/26/15 0915  BP: 98/64 108/71 106/71   Pulse: 87  86   Temp:    97.9 F (36.6 C)  TempSrc:    Oral  Resp: 17  22   Height:    5\' 10"  (1.778 m)  Weight:    265 lb (120.203 kg)  SpO2: 98%  96%    Labs / Imaging / Procedures: CBC Latest Ref Rng 07/26/2015 07/25/2015 04/19/2015  WBC 4.0 - 10.5 K/uL 15.9(H) 14.4(H) 12.8(H)  Hemoglobin 13.0 - 17.0 g/dL 13.3 15.5 9.3(L)  Hematocrit 39.0 - 52.0 % 41.2 46.9 30.4(L)  Platelets 150 - 400 K/uL 353 418(H) 467(H)   BMP Latest Ref Rng 07/26/2015 07/25/2015 04/18/2015  Glucose 65 - 99 mg/dL 181(H) 138(H) 83  BUN 6 - 20 mg/dL 17 17 13   Creatinine 0.61 - 1.24 mg/dL 0.93 1.06 1.01  Sodium 135 - 145 mmol/L 133(L) 133(L) 139  Potassium 3.5 - 5.1 mmol/L 4.5 4.3 3.7  Chloride 101 - 111 mmol/L 106 97(L) 104  CO2 22 - 32 mmol/L 22 27 28   Calcium 8.9 - 10.3 mg/dL 8.8(L) 9.9 8.9      Ref Range  12:36 AM 63mo ago     C Diff antigen NEGATIVE  POSITIVE (A) NEGATIVE    C Diff toxin NEGATIVE  NEGATIVE         Toxigenic C Difficile by pcr NEGATIVE  POSITIVE (A)       Dg Chest 2 View 07/25/2015  FINDINGS: Enlargement of cardiac silhouette. Mediastinal contours and pulmonary vascularity normal. Peribronchial thickening with LEFT basilar atelectasis. Chronic  accentuation of interstitial markings little changed. No definite acute infiltrate, pleural effusion or pneumothorax. Osseous demineralization. IMPRESSION: Mild enlargement of cardiac silhouette. Mild bronchitic changes with LEFT basilar atelectasis. Electronically Signed   By: Lavonia Dana M.D.   On: 07/25/2015 14:42   Ct Abdomen Pelvis W Contrast  07/25/2015  IMPRESSION: 2.7 x 3.0 x 6.8 cm fluid collection/abscess in the left mid abdomen, anterior to the left psoas muscle and adjacent to bowel, at the site of a prior pigtail drainage catheter. Associated 2.3 x 8.2 x 2.7 cm fluid collection extending through the left lateral abdominal wall to the skin surface, along a prior drain tract. Additional 1.9 x 1.4 x 6.3 cm fluid collection with associated tiny foci of gas in the left mid abdomen, anterior to the left kidney, at the site of a prior pigtail drainage catheter. Electronically Signed   By: Julian Hy M.D.   On: 07/25/2015 20:10   Physical Exam  General appearance: Obese gentleman sitting in bed, appears stated age, conversational Cardiovascular: Irregular heartbeat, no audible murmurs, no extremity edema Respiratory: Clear to auscultation bilaterally, normal work of breathing, no rales/wheezing/rhonchi Abdomen: Obese, soft,hyperactive bowel sounds, tender to palpation over LLQ with  4x6cm area of visible erythema, visibly swollen, site of previous drains, large midline erythematous scar, colostomy bag intact in RLQ Skin: Warm, dry, intact Neuro: Cranial nerves grossly intact, alert and oriented Psych: Tearful affect, reasonable frustration  Assessment/Plan: William Fischer is a 50 year old gentleman with 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 He presents again with intraabdominal abscess that likely requires surgical or IR intervention.   1.  Intraabdominal abscess, recurrent, two pockets of fluid within the left mid abdomen  -   IV Zosyn Q8  - Surgery following / Dr. Donne Hazel, awaiting recs for surgery vs IR  - Holding coumadin  - PRN Dilaudid 1mg Q2 IV and Tylenol Q6 po for pain control  - PRN Zofran for nausea  2. Sepsis, improved s/p 4L NS, afebrile/HDS this AM  - Blood cultures pending  - Continue broad spectrum abx  3. C. Diff colitis, pcr/antigen positve, toxin negative  - Begin po Vanc 125mg  QID  - Enteric precautions  4. Afib, controlled and anticoagulated  - Continue home diltiazem, digoxin, metoprolol  - Holding coumadin for imminent surgery vs IR  5. Depression, less controlled in setting of acute medical stressors  - Continue home Lexapro  DVTppx: start lovenox when INR <2  FEN: NPO  Dispo: Anticipated discharge in approximately 3-4 day(s).   LOS: 1 day   [name], MD 07/26/2015, 10:16 AM Pager: [phone]

## 2015-07-27 ENCOUNTER — Inpatient Hospital Stay (HOSPITAL_COMMUNITY): Payer: Self-pay

## 2015-07-27 LAB — GLUCOSE, CAPILLARY
GLUCOSE-CAPILLARY: 109 mg/dL — AB (ref 65–99)
GLUCOSE-CAPILLARY: 134 mg/dL — AB (ref 65–99)
Glucose-Capillary: 98 mg/dL (ref 65–99)
Glucose-Capillary: 95 mg/dL (ref 65–99)

## 2015-07-27 LAB — CBC
HCT: 40.9 % (ref 39.0–52.0)
Hemoglobin: 13.2 g/dL (ref 13.0–17.0)
MCH: 30.2 pg (ref 26.0–34.0)
MCHC: 32.3 g/dL (ref 30.0–36.0)
MCV: 93.6 fL (ref 78.0–100.0)
PLATELETS: 393 10*3/uL (ref 150–400)
RBC: 4.37 MIL/uL (ref 4.22–5.81)
RDW: 13.1 % (ref 11.5–15.5)
WBC: 19 10*3/uL — AB (ref 4.0–10.5)

## 2015-07-27 LAB — HEMOGLOBIN A1C
Hgb A1c MFr Bld: 6 % — ABNORMAL HIGH (ref 4.8–5.6)
Mean Plasma Glucose: 126 mg/dL

## 2015-07-27 LAB — BASIC METABOLIC PANEL
Anion gap: 8 (ref 5–15)
BUN: 17 mg/dL (ref 6–20)
CALCIUM: 8.9 mg/dL (ref 8.9–10.3)
CHLORIDE: 101 mmol/L (ref 101–111)
CO2: 27 mmol/L (ref 22–32)
CREATININE: 0.81 mg/dL (ref 0.61–1.24)
GFR calc Af Amer: >60 mL/min (ref >60)
Glucose, Bld: 126 mg/dL — ABNORMAL HIGH (ref 65–99)
Potassium: 3.6 mmol/L (ref 3.5–5.1)
SODIUM: 136 mmol/L (ref 135–145)

## 2015-07-27 LAB — PROTIME-INR
INR: 1.89 — ABNORMAL HIGH (ref 0.00–1.49)
Prothrombin Time: 21.6 seconds — ABNORMAL HIGH (ref 11.6–15.2)

## 2015-07-27 MED ORDER — LIDOCAINE HCL (PF) 1 % IJ SOLN
INTRAMUSCULAR | Status: AC
  Filled 2015-07-27: qty 30

## 2015-07-27 MED ORDER — MIDAZOLAM HCL 2 MG/2ML IJ SOLN
INTRAMUSCULAR | Status: AC
  Filled 2015-07-27: qty 6

## 2015-07-27 MED ORDER — OXYCODONE-ACETAMINOPHEN 5-325 MG PO TABS
1.0000 | ORAL_TABLET | ORAL | Status: DC | PRN
  Administered 2015-07-27 (×2): 2 via ORAL
  Administered 2015-07-27: 1 via ORAL
  Administered 2015-07-28 – 2015-07-29 (×5): 2 via ORAL
  Filled 2015-07-27 (×4): qty 2
  Filled 2015-07-27: qty 1
  Filled 2015-07-27 (×3): qty 2

## 2015-07-27 MED ORDER — HYDROMORPHONE HCL 1 MG/ML IJ SOLN
1.0000 mg | INTRAMUSCULAR | Status: DC | PRN
  Administered 2015-07-27 – 2015-07-28 (×4): 1 mg via INTRAVENOUS
  Filled 2015-07-27 (×4): qty 1

## 2015-07-27 MED ORDER — OXYCODONE-ACETAMINOPHEN 5-325 MG PO TABS
1.0000 | ORAL_TABLET | ORAL | Status: DC | PRN
Start: 2015-07-27 — End: 2015-07-27

## 2015-07-27 MED ORDER — VITAMIN K1 10 MG/ML IJ SOLN
10.0000 mg | Freq: Once | INTRAMUSCULAR | Status: AC
Start: 2015-07-27 — End: 2015-07-27
  Administered 2015-07-27: 10 mg via INTRAVENOUS
  Filled 2015-07-27: qty 1

## 2015-07-27 MED ORDER — FENTANYL CITRATE (PF) 100 MCG/2ML IJ SOLN
INTRAMUSCULAR | Status: AC
  Filled 2015-07-27: qty 4

## 2015-07-27 MED ORDER — MIDAZOLAM HCL 2 MG/2ML IJ SOLN
INTRAMUSCULAR | Status: AC | PRN
  Administered 2015-07-27: 2 mg via INTRAVENOUS

## 2015-07-27 MED ORDER — FENTANYL CITRATE (PF) 100 MCG/2ML IJ SOLN
INTRAMUSCULAR | Status: AC | PRN
  Administered 2015-07-27: 50 ug via INTRAVENOUS

## 2015-07-27 NOTE — Progress Notes (Signed)
Patient ID: William Fischer, male   DOB: 1965-07-11, 50 y.o.   MRN: [id number]   Pt tentatively scheduled for intra abdominal abscess drain placement (poss x 2) INR after Vit K is 1.89  Dr Kathlene Cote agreeable to proceed with more superficial abscess drain placement May have to wait for more normalized INR for deeper abscess drain  Will call for pt asap

## 2015-07-27 NOTE — Progress Notes (Signed)
Subjective: Annoyed they have to draw INR and the finger stick POC test is not available  Objective: Vital signs in last 24 hours: Temp:  [97.5 F (36.4 C)-98.2 F (36.8 C)] 97.5 F (36.4 C) (07/10 0330) Pulse Rate:  [63-93] 93 (07/10 0806) Resp:  [12-24] 12 (07/10 0806) BP: (94-125)/(57-79) 125/79 mmHg (07/10 0806) SpO2:  [94 %-96 %] 96 % (07/10 0806) Weight:  [120.203 kg (265 lb)] 120.203 kg (265 lb) (07/09 0915) Last BM Date: 07/25/15  Intake/Output from previous day: 07/09 0701 - 07/10 0700 In: 1237 [P.O.:237; I.V.:900; IV Piggyback:100] Out: 1750 [Urine:1750] Intake/Output this shift: Total I/O In: -  Out: 200 [Urine:200]  General appearance: cooperative Resp: clear to auscultation bilaterally GI: erythema and tenderness along old drain site LLQ, no gen tenderness, soft  Lab Results:   Recent Labs  07/26/15 0548 07/27/15 0457  WBC 15.9* 19.0*  HGB 13.3 13.2  HCT 41.2 40.9  PLT 353 393   BMET  Recent Labs  07/26/15 0548 07/27/15 0457  NA 133* 136  K 4.5 3.6  CL 106 101  CO2 22 27  GLUCOSE 181* 126*  BUN 17 17  CREATININE 0.93 0.81  CALCIUM 8.8* 8.9   PT/INR  Recent Labs  07/25/15 1855 07/26/15 0548  LABPROT 22.0* 24.6*  INR 1.93* 2.25*   ABG No results for input(s): PHART, HCO3 in the last 72 hours.  Invalid input(s): PCO2, PO2  Studies/Results: Dg Chest 2 View  07/25/2015  CLINICAL DATA:  Multiple abdominal surgeries with colon resection and complications over past several months, fever, LEFT lower quadrant pain, shortness of breath for 8 days, history chronic systolic heart failure, cardiomyopathy, atrial fibrillation EXAM: CHEST  2 VIEW COMPARISON:  04/16/2015 FINDINGS: Enlargement of cardiac silhouette. Mediastinal contours and pulmonary vascularity normal. Peribronchial thickening with LEFT basilar atelectasis. Chronic accentuation of interstitial markings little changed. No definite acute infiltrate, pleural effusion or pneumothorax.  Osseous demineralization. IMPRESSION: Mild enlargement of cardiac silhouette. Mild bronchitic changes with LEFT basilar atelectasis. Electronically Signed   By: Lavonia Dana M.D.   On: 07/25/2015 14:42   Ct Abdomen Pelvis W Contrast  07/25/2015  CLINICAL DATA:  Left abdominal pain/ swelling, evaluate for abscess EXAM: CT ABDOMEN AND PELVIS WITH CONTRAST TECHNIQUE: Multidetector CT imaging of the abdomen and pelvis was performed using the standard protocol following bolus administration of intravenous contrast. CONTRAST:  175mL ISOVUE-300 IOPAMIDOL (ISOVUE-300) INJECTION 61% COMPARISON:  04/23/2015 FINDINGS: Lower chest: Mild patchy subpleural opacity in the left lower lobe, favored to reflect atelectasis/scarring. Associated trace left pleural fluid versus pleural thickening. Hepatobiliary: Liver is notable for a 12 mm cyst in the posterior right hepatic lobe (series 2/image 27), unchanged. Gallbladder is unremarkable. No intrahepatic or extrahepatic ductal dilatation. Pancreas: Within normal limits. Spleen: Within normal limits. Adrenals/Urinary Tract: Adrenal glands are within normal limits. Kidneys are within normal limits.  No hydronephrosis. Bladder is mildly thick-walled although underdistended. Stomach/Bowel: Stomach is within normal limits. 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 No evidence of bowel obstruction. Normal appendix (series 2/image 59). Vascular/Lymphatic: No evidence of abdominal aortic aneurysm. No suspicious abdominopelvic lymphadenopathy. Reproductive: Prostate is unremarkable. Other: No abdominopelvic ascites. Interval removal of three prior pigtail drainage catheters. 2.7 x 3.0 x 6.8 cm fluid collection/abscess adjacent to bowel in the left lower abdomen, anterior to the left psoas muscle (series 2/image 68), at the site of a prior pigtail drainage catheter. Additional inflammatory changes extending superiorly to a thin, vertical 1.9 x 1.4 x  6.3 cm fluid collection in the left mid abdomen, anterior to the left kidney (series 2/ image 44), at the site of the prior pigtail drainage catheter. Additional inflammatory changes extending superiorly with a small foci of gas tracking adjacent to the pancreatic tail (series 2/ image 35). Inflammatory changes also extend superiorly to the stomach (series 2/ image 31). From the initial collection, inflammatory changes also extend inferiorly to an elongated 2.3 x 8.2 x 2.7 cm fluid collection extending through the left lateral abdominal wall (series 2/image 32), extending to the skin surface (series 2/image 74), along a prior drain tract. These findings are new from prior CT. Diastases of the midline anterior abdominal wall. Musculoskeletal: Visualized osseous structures are within normal limits. IMPRESSION: 2.7 x 3.0 x 6.8 cm fluid collection/abscess in the left mid abdomen, anterior to the left psoas muscle and adjacent to bowel, at the site of a prior pigtail drainage catheter. Associated 2.3 x 8.2 x 2.7 cm fluid collection extending through the left lateral abdominal wall to the skin surface, along a prior drain tract. Additional 1.9 x 1.4 x 6.3 cm fluid collection with associated tiny foci of gas in the left mid abdomen, anterior to the left kidney, at the site of a prior pigtail drainage catheter. Electronically Signed   By: Julian Hy M.D.   On: 07/25/2015 20:10    Anti-infectives: Anti-infectives    Start     Dose/Rate Route Frequency Ordered Stop   07/26/15 1100  vancomycin (VANCOCIN) 50 mg/mL oral solution 125 mg     125 mg Oral 4 times daily 07/26/15 1053 08/09/15 0959   07/26/15 0500  piperacillin-tazobactam (ZOSYN) IVPB 3.375 g     3.375 g 12.5 mL/hr over 240 Minutes Intravenous Every 8 hours 07/25/15 2018     07/25/15 2015  piperacillin-tazobactam (ZOSYN) IVPB 3.375 g     3.375 g 100 mL/hr over 30 Minutes Intravenous  Once 07/25/15 2013 07/25/15 2150      Assessment/Plan: S/P L  colectomy/colostomy 01/17/15 for necrotic L colon Recurrent IA abscess Give Vitamin K now then INR re-check IR to drain after above Vanc/Zosyn  LOS: 2 days    Kanai Hilger E 07/27/2015

## 2015-07-27 NOTE — Procedures (Signed)
Interventional Radiology Procedure Note  Procedure:  CT guided drainage of LLQ superficial abscess  Complications:  None  Estimated Blood Loss: < 10 mL  Given still high INR of 1.89 after Vit K, decision made to drain the dominant superficial component of LLQ abscess today.  The deeper component is tall and thin, and potentially difficult to drain currently.  Grossly purulent fluid aspirated and sent for culture studies.  12 Fr perc drain placed in superficial abscess.  Attached to suction bulb.  Will follow.  May not need separate drainage of the deeper peritoneal component if improves with antibiotics.  Recomm follow-up CT after drainage from current drain subsides.  Venetia Night. Kathlene Cote, M.D Pager:  [phone]

## 2015-07-27 NOTE — Progress Notes (Signed)
Subjective: William Fischer is frustrated and in pain this morning. His abdominal pain along with the visible erythema and induration has worsened. He lays very still when talking in bed because moving slightly hurts his stomach. He says that the IV Dilaudid he's been getting since yesterday (~Q2-3) has not been controlling his pain. He expressed frustration that his INR has not come down on it's own, and the need for vitamin K/INR recheck that has delayed intervention. He expressed some concerns about nursing staff that seemed to be misunderstandings on his part about lab draw techniques and cautionary spacing of opiate medications. We discussed these issues with the patient, as well as planned additions to his pain regimen and he expressed understanding.  During our visit he was receiving vitamin K with PT-INR recheck pending its completion (complete, INR 1.89).  Objective: Vital signs in last 24 hours: Filed Vitals:   07/26/15 2004 07/26/15 2339 07/27/15 0330 07/27/15 0400  BP: 107/67 102/66  113/76  Pulse: 70 65    Temp: 98 F (36.7 C) 98.2 F (36.8 C) 97.5 F (36.4 C)   TempSrc: Oral Oral Oral   Resp: 19 24  15   Height:      Weight:      SpO2: 95% 95%  95%    Intake/Output Summary (Last 24 hours) at 07/27/15 1402 Last data filed at 07/27/15 1308  Gross per 24 hour  Intake   1337 ml  Output   2700 ml  Net  -1363 ml    Physical Exam General appearance: Obese gentleman sitting in bed, appears stated age, conversational Cardiovascular: Irregular heartbeat, no audible murmurs, no extremity edema Respiratory: Clear to auscultation bilaterally, normal work of breathing, no rales/wheezing/rhonchi Abdomen: Obese, soft, normal bowel sounds, 6x10cm expanding area of erythema/induration over LLQ, large midline erythematous scar, full colostomy bag intact in RLQ Skin: Warm, dry, intact Neuro: Cranial nerves grossly intact, alert and oriented Psych: Tearful affect, reasonable  frustration  Labs / Imaging / Procedures: CBC Latest Ref Rng 07/27/2015 07/26/2015 07/25/2015  WBC 4.0 - 10.5 K/uL 19.0(H) 15.9(H) 14.4(H)  Hemoglobin 13.0 - 17.0 g/dL 13.2 13.3 15.5  Hematocrit 39.0 - 52.0 % 40.9 41.2 46.9  Platelets 150 - 400 K/uL 393 353 418(H)   BMP Latest Ref Rng 07/27/2015 07/26/2015 07/25/2015  Glucose 65 - 99 mg/dL 126(H) 181(H) 138(H)  BUN 6 - 20 mg/dL 17 17 17   Creatinine 0.61 - 1.24 mg/dL 0.81 0.93 1.06  Sodium 135 - 145 mmol/L 136 133(L) 133(L)  Potassium 3.5 - 5.1 mmol/L 3.6 4.5 4.3  Chloride 101 - 111 mmol/L 101 106 97(L)  CO2 22 - 32 mmol/L 27 22 27   Calcium 8.9 - 10.3 mg/dL 8.9 8.8(L) 9.9     Ref Range 11:21 AM    Prothrombin Time 11.6 - 15.2 seconds 21.6 (H)   INR 0.00 - 1.49  1.89 (H)        Ct Abdomen Pelvis W Contrast  07/25/2015  IMPRESSION: 2.7 x 3.0 x 6.8 cm fluid collection/abscess in the left mid abdomen, anterior to the left psoas muscle and adjacent to bowel, at the site of a prior pigtail drainage catheter. Associated 2.3 x 8.2 x 2.7 cm fluid collection extending through the left lateral abdominal wall to the skin surface, along a prior drain tract. Additional 1.9 x 1.4 x 6.3 cm fluid collection with associated tiny foci of gas in the left mid abdomen, anterior to the left kidney, at the site of a prior pigtail drainage catheter.  Electronically Signed   By: Julian Hy M.D.   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 He presents again with intraabdominal abscess that likely requires surgical or IR intervention.   1. Intraabdominal abscess, recurrent, worsening, two pockets of fluid within the left mid abdomen  -  IV Zosyn Q8  - Surgery following / Dr. Donne Hazel, plan is IR for drains with abx  - Held coumadin, given  vitamin K this AM, recheck came back as 1.89  - PRN Dilaudid 1mg Q2 IV and Tylenol Q6 po for pain control  - Added 1-2 tablets Oxycodone-Acetaminophen (Percocet) Q4hr PRN  - PRN Zofran for nausea  2. Sepsis, improved s/p 4L NS, afebrile/HDS this AM  - Blood cultures pending  - Continue broad spectrum abx  3. C. Diff colitis, pcr/antigen positve, toxin negative  - Continue po Vanc 125mg  QID  - Enteric precautions  4. Afib, rate controlled and anticoagulated  - Continue home diltiazem, digoxin, metoprolol  - Holding coumadin  5. Depression, less controlled in setting of acute medical stressors  - Continue home Lexapro  DVTppx: SCDs  FEN: NPO  Dispo: Anticipated discharge in approximately 3-4 day(s).   LOS: 2 days   [name], MD 07/27/2015, 7:11 AM Pager: [phone]

## 2015-07-27 NOTE — Progress Notes (Signed)
PT Cancellation Note  Patient Details Name: William Fischer MRN: [id number] DOB: 1965-10-18   Cancelled Treatment:    Reason Eval/Treat Not Completed: PT screened, no needs identified, will sign off; patient reports getting up in room without difficulty.  Feels he has no need for PT at this time (as well as no need for HHPT).  Will sign off.  Please order PT consult if pt declines.  Thanks.   Reginia Naas 07/27/2015, 10:54 AM  Magda Kiel, [place] 07/27/2015

## 2015-07-27 NOTE — Consult Note (Signed)
Chief Complaint: Patient was seen in consultation today for recurrent abdominal abscess Chief Complaint  Patient presents with  . Post-op Problem  . r/o sepsis    at the request of Dr Georganna Skeans  Referring Physician(s): Dr Georganna Skeans  Supervising Physician: Aletta Edouard  Patient Status: Inpatient  History of Present Illness: William Fischer is a 50 y.o. male   Well known to IR Has hx of intra abdominal abscess drains  Most recent removed 04/2015  Hx ischemic colitis Recurrent intra abdominal abscess CT 07/25/15: IMPRESSION: 2.7 x 3.0 x 6.8 cm fluid collection/abscess in the left mid abdomen, anterior to the left psoas muscle and adjacent to bowel, at the site of a prior pigtail drainage catheter.  Associated 2.3 x 8.2 x 2.7 cm fluid collection extending through the left lateral abdominal wall to the skin surface, along a prior drain tract.  Additional 1.9 x 1.4 x 6.3 cm fluid collection with associated tiny foci of gas in the left mid abdomen, anterior to the left kidney, at the site of a prior pigtail drainage catheter  Lap surgery 12/2014 -- partial colectomy/colostomy Known Afib; chronic coumadin INR 7/9 2.25 Leukocytosis Getting Vit K now and recheck INR later today. Request for replacement of recurrent abd abscess drain placement per Dr Donne Hazel and Dr Grandville Silos Imaging has been reviewed with Dr Barbie Banner and Dr Kathlene Cote Now scheduled for same    Past Medical History  Diagnosis Date  . Chicken pox as a child  . Migraine 06/29/2011  . Obesity 06/29/2011  . Depression with anxiety 06/29/2011  . Atrial fibrillation ([place])     admx 11/13 with acute sCHF in setting of RVR  => a. failed DCCV x 2; b. Pradaxa started;  c. failed sotalol  . Chronic systolic heart failure ([place])     a. echo 11/13: Ef 40-45%, diff HK, mod MR, mod LAE, mild RVE, mod RAE, small effusion;   b. TEE 11/13:  EF 35-40%, no LAA clot; Echo 2/14 shows normal EF  . Cardiomyopathy  ([place])     likely tachy mediated in setting of AF with RVR  . Snoring     patient needs sleep study - has declined  . Allergy to IVP dye   . Chronic anticoagulation     Pradaxa    Past Surgical History  Procedure Laterality Date  . Tee without cardioversion  11/30/2011    Procedure: TRANSESOPHAGEAL ECHOCARDIOGRAM (TEE);  Surgeon: Thayer Headings, MD;  Location: [place];  Service: Cardiovascular;  Laterality: N/A;  . Cardioversion  11/30/2011    Procedure: CARDIOVERSION;  Surgeon: Thayer Headings, MD;  Location: [place];  Service: Cardiovascular;  Laterality: N/A;  . Cardioversion  12/03/2011    Procedure: CARDIOVERSION;  Surgeon: Jolaine Artist, MD;  Location: [place];  Service: Cardiovascular;  Laterality: N/A;  . Laparotomy N/A 01/17/2015    Procedure: EXPLORATORY LAPAROTOMY WITH LEFT COLECTOMY AND COLOSTOMY;  Surgeon: Rolm Bookbinder, MD;  Location: [place];  Service: General;  Laterality: N/A;    Allergies: Contrast media  Medications: Prior to Admission medications   Medication Sig Start Date End Date Taking? Authorizing Provider  digoxin (LANOXIN) 0.125 MG tablet Take 1 tablet (0.125 mg total) by mouth daily. 04/21/15  Yes Scott Joylene Draft, PA-C  diltiazem (CARDIZEM CD) 180 MG 24 hr capsule Take 1 capsule (180 mg total) by mouth daily. 06/22/15  Yes Thompson Grayer, MD  feeding supplement (BOOST / RESOURCE BREEZE) LIQD Take 1 Container by mouth  daily.   Yes Historical Provider, MD  FIBER SELECT GUMMIES CHEW Chew 1 tablet by mouth daily.   Yes Historical Provider, MD  furosemide (LASIX) 40 MG tablet Take 1 tablet (40 mg total) by mouth daily as needed for edema. Patient taking differently: Take 40 mg by mouth daily.  06/22/15  Yes Thompson Grayer, MD  lisinopril (PRINIVIL,ZESTRIL) 5 MG tablet Take 1 tablet (5 mg total) by mouth daily. 04/21/15  Yes Scott Joylene Draft, PA-C  metoprolol (LOPRESSOR) 100 MG tablet Take 1 tablet (100 mg total) by mouth 2 (two) times daily. 03/23/15  Yes Sherran Needs, NP  Multiple Vitamin (MULTIVITAMIN WITH MINERALS) TABS tablet Take 1 tablet by mouth daily.   Yes Historical Provider, MD  neomycin-bacitracin-polymyxin (NEOSPORIN) OINT Apply 1 application topically daily. For abdominal wound Patient taking differently: Apply 1 application topically every 3 (three) days. For abdominal wound 04/19/15  Yes Riccardo Dubin, MD  warfarin (COUMADIN) 5 MG tablet Take 1 tablet (5 mg total) by mouth as directed. Patient taking differently: Take 2.5-5 mg by mouth daily at 6 PM. Take 1/2 tablet (2.5 mg) by mouth on Fridays, take 1 tablet (5 mg) on all other days of the week 07/03/15  Yes Thompson Grayer, MD     Family History  Problem Relation Age of Onset  . Hypertension Mother   . Hypertension Father   . Aneurysm Father   . Alcohol abuse Father   . Cancer Maternal Grandmother     brain  . Diabetes Maternal Grandfather     type 2  . Parkinsonism Maternal Grandfather   . Cancer Paternal Grandmother     breast  . Diabetes Paternal Grandmother   . Alcohol abuse Paternal Grandfather     Social History   Social History  . Marital Status: Divorced    Spouse Name: N/A  . Number of Children: N/A  . Years of Education: N/A   Social History Main Topics  . Smoking status: Former Smoker -- 1.00 packs/day for 20 years    Types: Cigarettes    Quit date: 07/10/2014  . Smokeless tobacco: Never Used  . Alcohol Use: No  . Drug Use: No  . Sexual Activity: No   Other Topics Concern  . None   Social History Narrative     Review of Systems: A 12 point ROS discussed and pertinent positives are indicated in the HPI above.  All other systems are negative.  Review of Systems  Constitutional: Positive for activity change, appetite change and fatigue. Negative for fever.  Respiratory: Negative for shortness of breath.   Gastrointestinal: Positive for nausea and abdominal pain.  Neurological: Positive for weakness.  Psychiatric/Behavioral: Negative for  behavioral problems and confusion.    Vital Signs: BP 125/79 mmHg  Pulse 93  Temp(Src) 98.5 F (36.9 C) (Oral)  Resp 12  Ht 5\' 10"  (1.778 m)  Wt 265 lb (120.203 kg)  BMI 38.02 kg/m2  SpO2 96%  Physical Exam  Constitutional: He is oriented to person, place, and time.  Cardiovascular: Normal rate, regular rhythm and normal heart sounds.   Pulmonary/Chest: Effort normal. He has wheezes.  Abdominal: Soft. Bowel sounds are normal. There is tenderness.  Musculoskeletal: Normal range of motion.  Neurological: He is alert and oriented to person, place, and time.  Skin: Skin is warm and dry.  LLQ reddened and swollen skin at area of previous drain Tender to touch  Psychiatric: He has a normal mood and affect. His behavior is normal.  Judgment and thought content normal.  Nursing note and vitals reviewed.   Mallampati Score:  MD Evaluation Airway: WNL Heart: WNL Abdomen: WNL Chest/ Lungs: WNL ASA  Classification: 3 Mallampati/Airway Score: One  Imaging: Dg Chest 2 View  07/25/2015  CLINICAL DATA:  Multiple abdominal surgeries with colon resection and complications over past several months, fever, LEFT lower quadrant pain, shortness of breath for 8 days, history chronic systolic heart failure, cardiomyopathy, atrial fibrillation EXAM: CHEST  2 VIEW COMPARISON:  04/16/2015 FINDINGS: Enlargement of cardiac silhouette. Mediastinal contours and pulmonary vascularity normal. Peribronchial thickening with LEFT basilar atelectasis. Chronic accentuation of interstitial markings little changed. No definite acute infiltrate, pleural effusion or pneumothorax. Osseous demineralization. IMPRESSION: Mild enlargement of cardiac silhouette. Mild bronchitic changes with LEFT basilar atelectasis. Electronically Signed   By: Lavonia Dana M.D.   On: 07/25/2015 14:42   Ct Abdomen Pelvis W Contrast  07/25/2015  CLINICAL DATA:  Left abdominal pain/ swelling, evaluate for abscess EXAM: CT ABDOMEN AND PELVIS WITH  CONTRAST TECHNIQUE: Multidetector CT imaging of the abdomen and pelvis was performed using the standard protocol following bolus administration of intravenous contrast. CONTRAST:  118mL ISOVUE-300 IOPAMIDOL (ISOVUE-300) INJECTION 61% COMPARISON:  04/23/2015 FINDINGS: Lower chest: Mild patchy subpleural opacity in the left lower lobe, favored to reflect atelectasis/scarring. Associated trace left pleural fluid versus pleural thickening. Hepatobiliary: Liver is notable for a 12 mm cyst in the posterior right hepatic lobe (series 2/image 27), unchanged. Gallbladder is unremarkable. No intrahepatic or extrahepatic ductal dilatation. Pancreas: Within normal limits. Spleen: Within normal limits. Adrenals/Urinary Tract: Adrenal glands are within normal limits. Kidneys are within normal limits.  No hydronephrosis. Bladder is mildly thick-walled although underdistended. Stomach/Bowel: Stomach is within normal limits. 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 No evidence of bowel obstruction. Normal appendix (series 2/image 59). Vascular/Lymphatic: No evidence of abdominal aortic aneurysm. No suspicious abdominopelvic lymphadenopathy. Reproductive: Prostate is unremarkable. Other: No abdominopelvic ascites. Interval removal of three prior pigtail drainage catheters. 2.7 x 3.0 x 6.8 cm fluid collection/abscess adjacent to bowel in the left lower abdomen, anterior to the left psoas muscle (series 2/image 68), at the site of a prior pigtail drainage catheter. Additional inflammatory changes extending superiorly to a thin, vertical 1.9 x 1.4 x 6.3 cm fluid collection in the left mid abdomen, anterior to the left kidney (series 2/ image 44), at the site of the prior pigtail drainage catheter. Additional inflammatory changes extending superiorly with a small foci of gas tracking adjacent to the pancreatic tail (series 2/ image 35). Inflammatory changes also extend superiorly to the  stomach (series 2/ image 31). From the initial collection, inflammatory changes also extend inferiorly to an elongated 2.3 x 8.2 x 2.7 cm fluid collection extending through the left lateral abdominal wall (series 2/image 32), extending to the skin surface (series 2/image 74), along a prior drain tract. These findings are new from prior CT. Diastases of the midline anterior abdominal wall. Musculoskeletal: Visualized osseous structures are within normal limits. IMPRESSION: 2.7 x 3.0 x 6.8 cm fluid collection/abscess in the left mid abdomen, anterior to the left psoas muscle and adjacent to bowel, at the site of a prior pigtail drainage catheter. Associated 2.3 x 8.2 x 2.7 cm fluid collection extending through the left lateral abdominal wall to the skin surface, along a prior drain tract. Additional 1.9 x 1.4 x 6.3 cm fluid collection with associated tiny foci of gas in the left mid abdomen, anterior to the left kidney, at  the site of a prior pigtail drainage catheter. Electronically Signed   By: Julian Hy M.D.   On: 07/25/2015 20:10    Labs:  CBC:  Recent Labs  04/19/15 0453 07/25/15 1441 07/26/15 0548 07/27/15 0457  WBC 12.8* 14.4* 15.9* 19.0*  HGB 9.3* 15.5 13.3 13.2  HCT 30.4* 46.9 41.2 40.9  PLT 467* 418* 353 393    COAGS:  Recent Labs  04/13/15 1730  06/22/15 1409 07/13/15 1529 07/25/15 1855 07/26/15 0548  INR 2.62*  < > 1.7 2.1 1.93* 2.25*  APTT 49*  --   --   --   --   --   < > = values in this interval not displayed.  BMP:  Recent Labs  04/18/15 0600 07/25/15 1441 07/26/15 0548 07/27/15 0457  NA 139 133* 133* 136  K 3.7 4.3 4.5 3.6  CL 104 97* 106 101  CO2 28 27 22 27   GLUCOSE 83 138* 181* 126*  BUN 13 17 17 17   CALCIUM 8.9 9.9 8.8* 8.9  CREATININE 1.01 1.06 0.93 0.81  GFRNONAA >60 >60 >60 >60  GFRAA >60 >60 >60 >60    LIVER FUNCTION TESTS:  Recent Labs  04/14/15 0504 04/15/15 0605 07/25/15 1441 07/26/15 0548  BILITOT 0.6 0.4 0.5 0.7  AST  13* 13* 18 13*  ALT 23 18 22 17   ALKPHOS 59 55 88 74  PROT 7.1 6.6 8.4* 7.0  ALBUMIN 2.5* 2.3* 3.3* 2.6*    TUMOR MARKERS: No results for input(s): AFPTM, CEA, CA199, CHROMGRNA in the last 8760 hours.  Assessment and Plan:  Recurrent abdominal abscess (es) Scheduled or drain placement INR 2.25 today----getting Vit K now Will recheck INR Plan for possible placement today if INR normalizes Risks and Benefits discussed with the patient including bleeding, infection, damage to adjacent structures, bowel perforation/fistula connection, and sepsis. All of the patient's questions were answered, patient is agreeable to proceed. Consent signed and in chart.  Thank you for this interesting consult.  I greatly enjoyed meeting William Fischer and look forward to participating in their care.  A copy of this report was sent to the requesting provider on this date.  Electronically Signed: Ruby Logiudice A 07/27/2015, 8:52 AM   I spent a total of 40 Minutes    in face to face in clinical consultation, greater than 50% of which was counseling/coordinating care for Intra abdominal abscess drain

## 2015-07-27 NOTE — Sedation Documentation (Signed)
Patient is resting comfortably. 

## 2015-07-27 NOTE — Progress Notes (Signed)
This RN discussed with pt at shift change what time med pass began and what meds he will be receiving.  When time to give scheduled 10 pm medication pt informed RN that he took his own Metoprolol 100mg  at 8pm.  Pt stated we give our medication out to late.  IMTS notified and orders given to make sure to hold night dose of Metoprolol.  BP now 100/61, HR 72.

## 2015-07-27 NOTE — Progress Notes (Signed)
Offered Pt a bath, He stated he would like to wait until later, RN aware. Tech will retry later.

## 2015-07-27 NOTE — Care Management Note (Addendum)
Case Management Note  Patient Details  Name: William Fischer MRN: [id number] Date of Birth: [birth year]  Subjective/Objective:   CM received referral re pt's MCD card showing as inactive upon admission.  Spoke with admitting, who checked MCD card and it was showing as inactive.  Talked with financial counselor who stated the MCD verification site was not currently working correctly.  She will recheck when system is working and either enter correct information or will meet with pt to assist with new application.  Provided information to pt.                              Expected Discharge Plan:  Home/Self Care  In-House Referral:  Financial Counselor  Discharge planning Services  CM Consult  Status of Service:  In process, will continue to follow  Girard Cooter, RN 07/27/2015, 12:15 PM

## 2015-07-27 NOTE — Progress Notes (Signed)
OT Cancellation Note  Patient Details Name: PETER SCHOLLE MRN: [id number] DOB: Oct 13, 1965   Cancelled Treatment:    Reason Eval/Treat Not Completed: OT screened, no needs identified, will sign off  Darlina Rumpf [place], OTR/L [id number]  07/27/2015, 3:59 PM

## 2015-07-27 NOTE — Progress Notes (Signed)
Pharmacy Antibiotic Note  William Fischer is a 50 y.o. male s/p left colectomy/colostomy in 12/2014 presented with left lower abdominal pain that has been worsening over the last 10 days PTA.  Pharmacy consulted to manage Zosyn for intra-abdominal infection.  He is also on PO vancomycin for C.diff.  Patient with excellent renal clearance.   Plan: - Continue Zosyn 3.375gm IV Q8H, 4 hr infusion - Pharmacy will sign off and follow peripherally.  Thank you for the consult! - F/U with resuming AC post procedures/surgeries  Height: 5\' 10"  (177.8 cm) Weight: 265 lb (120.203 kg) IBW/kg (Calculated) : 73  Temp (24hrs), Avg:97.9 F (36.6 C), Min:97.5 F (36.4 C), Max:98.5 F (36.9 C)   Recent Labs Lab 07/25/15 1441 07/25/15 1914 07/26/15 0548 07/27/15 0457  WBC 14.4*  --  15.9* 19.0*  CREATININE 1.06  --  0.93 0.81  LATICACIDVEN 1.42 2.73*  --   --     Estimated Creatinine Clearance: 146.5 mL/min (by C-G formula based on Cr of 0.81).    Allergies  Allergen Reactions  . Contrast Media [Iodinated Diagnostic Agents] Rash    a diffuse macular rash after CTA chest  Pt was premedicated with 125mg  IV Solumedrol, 50mg  IV Benadryl 1 hr prior to CTexam, and tolerated procedure without any difficulties. 11/28/11 Also same pre med protocol observed on 02/22/15 and pt tolerated the procedure well    Antimicrobials this admission: 7/8 Zosyn >> 7/9 PO Vanc >>  Dose adjustments this admission: n/a  Microbiology results: 7/8 BCx x2 - NGTD 7/8 UCx - negative 7/9 MRSA PCR - negative 7/9 Cdiff PCR - positive   William Fischer D. Mina Marble, PharmD, BCPS Pager:  [phone] 07/27/2015, 8:47 AM

## 2015-07-28 DIAGNOSIS — Z978 Presence of other specified devices: Secondary | ICD-10-CM

## 2015-07-28 DIAGNOSIS — I4891 Unspecified atrial fibrillation: Secondary | ICD-10-CM

## 2015-07-28 LAB — PROTIME-INR
INR: 1.43 (ref 0.00–1.49)
PROTHROMBIN TIME: 17.5 s — AB (ref 11.6–15.2)

## 2015-07-28 LAB — CBC
HCT: 40.3 % (ref 39.0–52.0)
HEMOGLOBIN: 13.1 g/dL (ref 13.0–17.0)
MCH: 30.3 pg (ref 26.0–34.0)
MCHC: 32.5 g/dL (ref 30.0–36.0)
MCV: 93.3 fL (ref 78.0–100.0)
PLATELETS: 382 10*3/uL (ref 150–400)
RBC: 4.32 MIL/uL (ref 4.22–5.81)
RDW: 13.4 % (ref 11.5–15.5)
WBC: 12.7 10*3/uL — ABNORMAL HIGH (ref 4.0–10.5)

## 2015-07-28 LAB — BASIC METABOLIC PANEL
Anion gap: 7 (ref 5–15)
BUN: 8 mg/dL (ref 6–20)
CHLORIDE: 101 mmol/L (ref 101–111)
CO2: 30 mmol/L (ref 22–32)
Calcium: 9.2 mg/dL (ref 8.9–10.3)
Creatinine, Ser: 1.04 mg/dL (ref 0.61–1.24)
Glucose, Bld: 139 mg/dL — ABNORMAL HIGH (ref 65–99)
Potassium: 4.5 mmol/L (ref 3.5–5.1)
SODIUM: 138 mmol/L (ref 135–145)

## 2015-07-28 LAB — GLUCOSE, CAPILLARY
GLUCOSE-CAPILLARY: 109 mg/dL — AB (ref 65–99)
Glucose-Capillary: 103 mg/dL — ABNORMAL HIGH (ref 65–99)
Glucose-Capillary: 137 mg/dL — ABNORMAL HIGH (ref 65–99)
Glucose-Capillary: 125 mg/dL — ABNORMAL HIGH (ref 65–99)

## 2015-07-28 MED ORDER — METOPROLOL TARTRATE 100 MG PO TABS
100.0000 mg | ORAL_TABLET | Freq: Two times a day (BID) | ORAL | Status: DC
  Administered 2015-07-28 – 2015-07-29 (×3): 100 mg via ORAL
  Filled 2015-07-28 (×3): qty 1

## 2015-07-28 NOTE — Progress Notes (Signed)
07/28/2015 William Fischer (NT) told RN patient had medication in room at 1745. Rn went to talk to patient about medication and that medication have to be take to the pharmacy. Patient got upset and said he was going to leave the hospital. Patient disconnect Iv and got dress. Rn called internal medicine teaching service concerning patient and she spoke to Dr Burney Gauze. Rn was told patient can sign AMA, but while RN in patient room Dr [name] came by patient room and spoke to him. Patient agree to stay and gave Rn the medication. [facility] RN.

## 2015-07-28 NOTE — Progress Notes (Signed)
07/28/2015 Medication was counted and sign by patient at 2052. Rn took medication to pharmacy at 2105 and place paperwork in chart. [facility] RN.

## 2015-07-28 NOTE — Progress Notes (Signed)
Referring Physician(s): Dr Rolm Bookbinder  Supervising Physician: Sandi Mariscal  Patient Status:  Inpatient  Chief Complaint:  Intra abdominal recurrent abscess LLQ abscess drain placed in IR 7/10  Subjective:  Better already today Much less painful abd less tight Output significant overnight  Allergies: Contrast media  Medications: Prior to Admission medications   Medication Sig Start Date End Date Taking? Authorizing Provider  digoxin (LANOXIN) 0.125 MG tablet Take 1 tablet (0.125 mg total) by mouth daily. 04/21/15  Yes Scott Joylene Draft, PA-C  diltiazem (CARDIZEM CD) 180 MG 24 hr capsule Take 1 capsule (180 mg total) by mouth daily. 06/22/15  Yes Thompson Grayer, MD  feeding supplement (BOOST / RESOURCE BREEZE) LIQD Take 1 Container by mouth daily.   Yes Historical Provider, MD  FIBER SELECT GUMMIES CHEW Chew 1 tablet by mouth daily.   Yes Historical Provider, MD  furosemide (LASIX) 40 MG tablet Take 1 tablet (40 mg total) by mouth daily as needed for edema. Patient taking differently: Take 40 mg by mouth daily.  06/22/15  Yes Thompson Grayer, MD  lisinopril (PRINIVIL,ZESTRIL) 5 MG tablet Take 1 tablet (5 mg total) by mouth daily. 04/21/15  Yes Scott Joylene Draft, PA-C  metoprolol (LOPRESSOR) 100 MG tablet Take 1 tablet (100 mg total) by mouth 2 (two) times daily. 03/23/15  Yes Sherran Needs, NP  Multiple Vitamin (MULTIVITAMIN WITH MINERALS) TABS tablet Take 1 tablet by mouth daily.   Yes Historical Provider, MD  neomycin-bacitracin-polymyxin (NEOSPORIN) OINT Apply 1 application topically daily. For abdominal wound Patient taking differently: Apply 1 application topically every 3 (three) days. For abdominal wound 04/19/15  Yes Riccardo Dubin, MD  warfarin (COUMADIN) 5 MG tablet Take 1 tablet (5 mg total) by mouth as directed. Patient taking differently: Take 2.5-5 mg by mouth daily at 6 PM. Take 1/2 tablet (2.5 mg) by mouth on Fridays, take 1 tablet (5 mg) on all other days of the week  07/03/15  Yes Thompson Grayer, MD     Vital Signs: BP 113/67 mmHg  Pulse 93  Temp(Src) 99.1 F (37.3 C) (Oral)  Resp 27  Ht 5\' 10"  (1.778 m)  Wt 265 lb (120.203 kg)  BMI 38.02 kg/m2  SpO2 97%  Physical Exam  Constitutional: He is oriented to person, place, and time.  Abdominal: Soft. There is tenderness.  Less tender today  Neurological: He is alert and oriented to person, place, and time.  Skin: Skin is warm. There is erythema.  Skin site clean and dry Less red today Drain intact Output 170 cc yesterday Brown milky fluid 30 cc in JP now   Nursing note and vitals reviewed. wbc down from 19 to 12.7 Cx pending  Imaging: Dg Chest 2 View  07/25/2015  CLINICAL DATA:  Multiple abdominal surgeries with colon resection and complications over past several months, fever, LEFT lower quadrant pain, shortness of breath for 8 days, history chronic systolic heart failure, cardiomyopathy, atrial fibrillation EXAM: CHEST  2 VIEW COMPARISON:  04/16/2015 FINDINGS: Enlargement of cardiac silhouette. Mediastinal contours and pulmonary vascularity normal. Peribronchial thickening with LEFT basilar atelectasis. Chronic accentuation of interstitial markings little changed. No definite acute infiltrate, pleural effusion or pneumothorax. Osseous demineralization. IMPRESSION: Mild enlargement of cardiac silhouette. Mild bronchitic changes with LEFT basilar atelectasis. Electronically Signed   By: Lavonia Dana M.D.   On: 07/25/2015 14:42   Ct Abdomen Pelvis W Contrast  07/25/2015  CLINICAL DATA:  Left abdominal pain/ swelling, evaluate for abscess EXAM: CT ABDOMEN AND  PELVIS WITH CONTRAST TECHNIQUE: Multidetector CT imaging of the abdomen and pelvis was performed using the standard protocol following bolus administration of intravenous contrast. CONTRAST:  180mL ISOVUE-300 IOPAMIDOL (ISOVUE-300) INJECTION 61% COMPARISON:  04/23/2015 FINDINGS: Lower chest: Mild patchy subpleural opacity in the left lower lobe,  favored to reflect atelectasis/scarring. Associated trace left pleural fluid versus pleural thickening. Hepatobiliary: Liver is notable for a 12 mm cyst in the posterior right hepatic lobe (series 2/image 27), unchanged. Gallbladder is unremarkable. No intrahepatic or extrahepatic ductal dilatation. Pancreas: Within normal limits. Spleen: Within normal limits. Adrenals/Urinary Tract: Adrenal glands are within normal limits. Kidneys are within normal limits.  No hydronephrosis. Bladder is mildly thick-walled although underdistended. Stomach/Bowel: Stomach is within normal limits. 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 No evidence of bowel obstruction. Normal appendix (series 2/image 59). Vascular/Lymphatic: No evidence of abdominal aortic aneurysm. No suspicious abdominopelvic lymphadenopathy. Reproductive: Prostate is unremarkable. Other: No abdominopelvic ascites. Interval removal of three prior pigtail drainage catheters. 2.7 x 3.0 x 6.8 cm fluid collection/abscess adjacent to bowel in the left lower abdomen, anterior to the left psoas muscle (series 2/image 68), at the site of a prior pigtail drainage catheter. Additional inflammatory changes extending superiorly to a thin, vertical 1.9 x 1.4 x 6.3 cm fluid collection in the left mid abdomen, anterior to the left kidney (series 2/ image 44), at the site of the prior pigtail drainage catheter. Additional inflammatory changes extending superiorly with a small foci of gas tracking adjacent to the pancreatic tail (series 2/ image 35). Inflammatory changes also extend superiorly to the stomach (series 2/ image 31). From the initial collection, inflammatory changes also extend inferiorly to an elongated 2.3 x 8.2 x 2.7 cm fluid collection extending through the left lateral abdominal wall (series 2/image 32), extending to the skin surface (series 2/image 74), along a prior drain tract. These findings are new from prior CT.  Diastases of the midline anterior abdominal wall. Musculoskeletal: Visualized osseous structures are within normal limits. IMPRESSION: 2.7 x 3.0 x 6.8 cm fluid collection/abscess in the left mid abdomen, anterior to the left psoas muscle and adjacent to bowel, at the site of a prior pigtail drainage catheter. Associated 2.3 x 8.2 x 2.7 cm fluid collection extending through the left lateral abdominal wall to the skin surface, along a prior drain tract. Additional 1.9 x 1.4 x 6.3 cm fluid collection with associated tiny foci of gas in the left mid abdomen, anterior to the left kidney, at the site of a prior pigtail drainage catheter. Electronically Signed   By: Julian Hy M.D.   On: 07/25/2015 20:10   Ct Image Guided Drainage By Percutaneous Catheter  07/27/2015  CLINICAL DATA:  History of multiple percutaneous drainage catheter placements for abdominal abscesses following prior colonic resection for infarction. Development of new left lower quadrant subcutaneous abscess extending to the skin with additional deeper component in the left lower abdomen. EXAM: CT GUIDED DRAINAGE OF LEFT LOWER QUADRANT SUPERFICIAL ABSCESS ANESTHESIA/SEDATION: 2.0 Mg IV Versed 50 mcg IV Fentanyl Total Moderate Sedation Time:  15 minutes The patient's level of consciousness and physiologic status were continuously monitored during the procedure by Radiology nursing. PROCEDURE: The procedure, risks, benefits, and alternatives were explained to the patient. Questions regarding the procedure were encouraged and answered. The patient understands and consents to the procedure. The left lower abdominal wall was prepped with chlorhexidine in a sterile fashion, and a sterile drape was applied covering the operative field. A sterile  gown and sterile gloves were used for the procedure. Local anesthesia was provided with 1% Lidocaine. CT was performed of the lower abdomen and upper pelvis in a supine position. An 18 gauge trocar needle was  advanced into a superficial left lower quadrant abscess. A guidewire was advanced. A 12 French drain was then advanced over the wire and formed. The drain was connected to a suction bulb and secured at the skin with a Prolene retention suture. COMPLICATIONS: None FINDINGS: Due to persistent elevated INR despite vitamin K treatment, decision was made to currently drain in the more superficial subcutaneous left lower quadrant abscess under CT guidance. The deeper thin abscess adjacent to small bowel would be more difficult to drain and was not addressed today due to the elevated INR. There was immediate drainage of grossly purulent fluid from the subcutaneous abscess. A sample of fluid was sent for culture analysis. After placing a 12 French drain in the collection, there is copious return of purulent fluid with suction bulb drainage. IMPRESSION: 1. CT-guided percutaneous drainage performed today of the superficial left lower quadrant abscess with placement of a 12 French drain. There was return of a large amount of purulent fluid and a sample was sent for culture analysis. 2. The deeper and thin left-sided intra-abdominal abscess was not attempted to be drained today due to persistent elevation of INR. This collection would also technically be difficult to drain given its shape and location. It is possible that drainage of the superficial collection along with antibiotic treatment may help to resolve the intra abdominal collection. Electronically Signed   By: Aletta Edouard M.D.   On: 07/27/2015 18:14    Labs:  CBC:  Recent Labs  07/25/15 1441 07/26/15 0548 07/27/15 0457 07/28/15 0539  WBC 14.4* 15.9* 19.0* 12.7*  HGB 15.5 13.3 13.2 13.1  HCT 46.9 41.2 40.9 40.3  PLT 418* 353 393 382    COAGS:  Recent Labs  04/13/15 1730  07/25/15 1855 07/26/15 0548 07/27/15 1121 07/28/15 0539  INR 2.62*  < > 1.93* 2.25* 1.89* 1.43  APTT 49*  --   --   --   --   --   < > = values in this interval not  displayed.  BMP:  Recent Labs  07/25/15 1441 07/26/15 0548 07/27/15 0457 07/28/15 0539  NA 133* 133* 136 138  K 4.3 4.5 3.6 4.5  CL 97* 106 101 101  CO2 27 22 27 30   GLUCOSE 138* 181* 126* 139*  BUN 17 17 17 8   CALCIUM 9.9 8.8* 8.9 9.2  CREATININE 1.06 0.93 0.81 1.04  GFRNONAA >60 >60 >60 >60  GFRAA >60 >60 >60 >60    LIVER FUNCTION TESTS:  Recent Labs  04/14/15 0504 04/15/15 0605 07/25/15 1441 07/26/15 0548  BILITOT 0.6 0.4 0.5 0.7  AST 13* 13* 18 13*  ALT 23 18 22 17   ALKPHOS 59 55 88 74  PROT 7.1 6.6 8.4* 7.0  ALBUMIN 2.5* 2.3* 3.3* 2.6*    Assessment and Plan:  Recurrent abd abscess Drain intact Will follow  Electronically Signed: Shatha Hooser A 07/28/2015, 8:22 AM   I spent a total of 15 Minutes at the the patient's bedside AND on the patient's hospital floor or unit, greater than 50% of which was counseling/coordinating care for abd abscess drain

## 2015-07-28 NOTE — Progress Notes (Signed)
Subjective: Feels better  Objective: Vital signs in last 24 hours: Temp:  [98.2 F (36.8 C)-99.1 F (37.3 C)] 99.1 F (37.3 C) (07/11 0728) Pulse Rate:  [76-104] 93 (07/11 0728) Resp:  [12-27] 27 (07/11 0728) BP: (78-119)/(50-76) 113/67 mmHg (07/11 0728) SpO2:  [79 %-98 %] 97 % (07/11 0728) Last BM Date: 07/27/15  Intake/Output from previous day: 07/10 0701 - 07/11 0700 In: 2130 [P.O.:120; I.V.:1800; IV Piggyback:200] Out: 4645 [Urine:4175; Drains:170; Stool:300] Intake/Output this shift:    General appearance: cooperative Resp: clear to auscultation bilaterally GI: soft, less erythema LLQ, drain with purulent output  Lab Results:   Recent Labs  07/27/15 0457 07/28/15 0539  WBC 19.0* 12.7*  HGB 13.2 13.1  HCT 40.9 40.3  PLT 393 382   BMET  Recent Labs  07/27/15 0457 07/28/15 0539  NA 136 138  K 3.6 4.5  CL 101 101  CO2 27 30  GLUCOSE 126* 139*  BUN 17 8  CREATININE 0.81 1.04  CALCIUM 8.9 9.2   PT/INR  Recent Labs  07/27/15 1121 07/28/15 0539  LABPROT 21.6* 17.5*  INR 1.89* 1.43   ABG No results for input(s): PHART, HCO3 in the last 72 hours.  Invalid input(s): PCO2, PO2  Studies/Results: Ct Image Guided Drainage By Percutaneous Catheter  07/27/2015  CLINICAL DATA:  History of multiple percutaneous drainage catheter placements for abdominal abscesses following prior colonic resection for infarction. Development of new left lower quadrant subcutaneous abscess extending to the skin with additional deeper component in the left lower abdomen. EXAM: CT GUIDED DRAINAGE OF LEFT LOWER QUADRANT SUPERFICIAL ABSCESS ANESTHESIA/SEDATION: 2.0 Mg IV Versed 50 mcg IV Fentanyl Total Moderate Sedation Time:  15 minutes The patient's level of consciousness and physiologic status were continuously monitored during the procedure by Radiology nursing. PROCEDURE: The procedure, risks, benefits, and alternatives were explained to the patient. Questions regarding the  procedure were encouraged and answered. The patient understands and consents to the procedure. The left lower abdominal wall was prepped with chlorhexidine in a sterile fashion, and a sterile drape was applied covering the operative field. A sterile gown and sterile gloves were used for the procedure. Local anesthesia was provided with 1% Lidocaine. CT was performed of the lower abdomen and upper pelvis in a supine position. An 18 gauge trocar needle was advanced into a superficial left lower quadrant abscess. A guidewire was advanced. A 12 French drain was then advanced over the wire and formed. The drain was connected to a suction bulb and secured at the skin with a Prolene retention suture. COMPLICATIONS: None FINDINGS: Due to persistent elevated INR despite vitamin K treatment, decision was made to currently drain in the more superficial subcutaneous left lower quadrant abscess under CT guidance. The deeper thin abscess adjacent to small bowel would be more difficult to drain and was not addressed today due to the elevated INR. There was immediate drainage of grossly purulent fluid from the subcutaneous abscess. A sample of fluid was sent for culture analysis. After placing a 12 French drain in the collection, there is copious return of purulent fluid with suction bulb drainage. IMPRESSION: 1. CT-guided percutaneous drainage performed today of the superficial left lower quadrant abscess with placement of a 12 French drain. There was return of a large amount of purulent fluid and a sample was sent for culture analysis. 2. The deeper and thin left-sided intra-abdominal abscess was not attempted to be drained today due to persistent elevation of INR. This collection would also technically be difficult  to drain given its shape and location. It is possible that drainage of the superficial collection along with antibiotic treatment may help to resolve the intra abdominal collection. Electronically Signed   By: Aletta Edouard M.D.   On: 07/27/2015 18:14    Anti-infectives: Anti-infectives    Start     Dose/Rate Route Frequency Ordered Stop   07/26/15 1100  vancomycin (VANCOCIN) 50 mg/mL oral solution 125 mg     125 mg Oral 4 times daily 07/26/15 1053 08/09/15 0959   07/26/15 0500  piperacillin-tazobactam (ZOSYN) IVPB 3.375 g     3.375 g 12.5 mL/hr over 240 Minutes Intravenous Every 8 hours 07/25/15 2018     07/25/15 2015  piperacillin-tazobactam (ZOSYN) IVPB 3.375 g     3.375 g 100 mL/hr over 30 Minutes Intravenous  Once 07/25/15 2013 07/25/15 2150      Assessment/Plan: S/P L colectomy/colostomy 01/17/15 for necrotic L colon Recurrent IA abscess - S/P perc drain by IR Vanc/Zosyn for now, consider oral antibiotics tomorrow. Likely can resume coumadin then if he continues to improve.  LOS: 3 days    Anora Schwenke E 07/28/2015

## 2015-07-28 NOTE — Plan of Care (Signed)
This evening an episode occured where patient got very upset and was dressed to leave AMA. Diffused and agreed to stay. Apparently his bag of medications was discovered in his possessions bag and nursing became concerned - stating that this needed to be reported/recorded and meds confiscated and held pharmacy until discharge. Patient misunderstood this as a "report" to security and that he was in trouble and he became quite agitated. He had brought these meds on admission for med reconciliation (unclear why they had not been removed earlier) but on at least 1 occasion he took a home dose of  metoprolol himself. Patient has had difficulties being short-fused with nursing staff and we had a discussion about this and about the misunderstanding.

## 2015-07-28 NOTE — Progress Notes (Signed)
Subjective: William Fischer is doing well this morning with his pain under significantly better control after starting po Percocet yesterday. He says he slept well overnight and finally got some rest. He underwent CT-guided drain placement of his superficial abscess yesterday with IR/Dr. Kathlene Cote. Cultures of his abscess fluid were sent and 10-20cc of fluid have drained so far. We are awaiting possible drainage of the communicating/deeper peritoneal abscess and plan to repeat CT once drainage from this newly-placed drain stops.  Objective: Vital signs in last 24 hours: Filed Vitals:   07/28/15 0228 07/28/15 0400 07/28/15 0728 07/28/15 1238  BP:  108/66 113/67 101/56  Pulse:   93 76  Temp: 99 F (37.2 C)  99.1 F (37.3 C) 97.9 F (36.6 C)  TempSrc: Oral  Oral Oral  Resp:   27 18  Height:      Weight:      SpO2:  90% 97% 95%    Intake/Output Summary (Last 24 hours) at 07/28/15 1343 Last data filed at 07/28/15 1213  Gross per 24 hour  Intake   2030 ml  Output   4470 ml  Net  -2440 ml   Physical Exam General appearance: Obese gentleman sitting in bed, appears stated age, conversational Cardiovascular: Irregular heartbeat, no audible murmurs, no extremity edema Respiratory: Clear to auscultation bilaterally, normal work of breathing Abdomen: Obese, soft, normal bowel sounds, bandaged RLQ with drain and suction bulb in place,  large midline erythematous scar, colostomy bag intact in RLQ Skin: Warm, dry, intact Neuro: Cranial nerves grossly intact, alert and oriented Psych: Normal affect  Labs / Imaging / Procedures: CBC Latest Ref Rng 07/28/2015 07/27/2015 07/26/2015  WBC 4.0 - 10.5 K/uL 12.7(H) 19.0(H) 15.9(H)  Hemoglobin 13.0 - 17.0 g/dL 13.1 13.2 13.3  Hematocrit 39.0 - 52.0 % 40.3 40.9 41.2  Platelets 150 - 400 K/uL 382 393 353   BMP Latest Ref Rng 07/28/2015 07/27/2015 07/26/2015  Glucose 65 - 99 mg/dL 139(H) 126(H) 181(H)  BUN 6 - 20 mg/dL 8 17 17   Creatinine 0.61 - 1.24 mg/dL  1.04 0.81 0.93  Sodium 135 - 145 mmol/L 138 136 133(L)  Potassium 3.5 - 5.1 mmol/L 4.5 3.6 4.5  Chloride 101 - 111 mmol/L 101 101 106  CO2 22 - 32 mmol/L 30 27 22   Calcium 8.9 - 10.3 mg/dL 9.2 8.9 8.8(L)      Ref Range 5:39 AM    Prothrombin Time 11.6 - 15.2 seconds 17.5 (H)   INR 0.00 - 1.49  1.43   Resulting Agency [facility]          Ct Abdomen Pelvis W Contrast  07/25/2015  IMPRESSION: 2.7 x 3.0 x 6.8 cm fluid collection/abscess in the left mid abdomen, anterior to the left psoas muscle and adjacent to bowel, at the site of a prior pigtail drainage catheter. Associated 2.3 x 8.2 x 2.7 cm fluid collection extending through the left lateral abdominal wall to the skin surface, along a prior drain tract. Additional 1.9 x 1.4 x 6.3 cm fluid collection with associated tiny foci of gas in the left mid abdomen, anterior to the left kidney, at the site of a prior pigtail drainage catheter. Electronically Signed   By: Julian Hy M.D.   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 He presents again with intraabdominal abscess  that likely requires surgical or IR intervention.   1. Intraabdominal abscess, recurrent, worsening, two pockets of fluid within the left mid abdomen, s/p IR drain placement on 07/27/15  -  IV Zosyn Q8  - PRN Dilaudid 1mg Q2 IV and Tylenol Q6 po for pain control  - 1-2 tablets Oxycodone-Acetaminophen (Percocet) Q4hr PRN  - PRN Zofran for nausea  - Once drain output ceases, repeat CT abd to assess for abscess resolution  2. C. Diff colitis, pcr/antigen positve, toxin negative  - Continue po Vanc 125mg  QID  - Enteric precautions  4. Afib, rate controlled and anticoagulated  - Continue home diltiazem, digoxin, metoprolol  - Holding coumadin  5.  Depression, less controlled in setting of acute medical stressors  - Continue home Lexapro  6. Pre-diabetes, HbA1c 6.0, blood sugars have not been running high during hospitalization - unclear why SSI was started  - DC SSI and FSBG checks  DVTppx: SCDs  FEN: Full liquid diet  Dispo: Anticipated discharge in approximately 3-4 day(s).   LOS: 3 days   [name], MD 07/28/2015, 1:23 PM Pager: [phone]

## 2015-07-28 NOTE — Progress Notes (Signed)
Patient began complaining of drainage around his catheter site today with drain injections as well as when he stands up.  His drain is very superficial, just in his abdominal wall.  It is NOT intraabdominal.  I have spoken to Dr. Kathlene Cote.  For now, we will hold off on drain flushes and see how things go.  Lynnette Pote E 3:17 PM 07/28/2015

## 2015-07-29 ENCOUNTER — Other Ambulatory Visit: Payer: Self-pay | Admitting: General Surgery

## 2015-07-29 DIAGNOSIS — K651 Peritoneal abscess: Secondary | ICD-10-CM

## 2015-07-29 DIAGNOSIS — IMO0002 Reserved for concepts with insufficient information to code with codable children: Secondary | ICD-10-CM

## 2015-07-29 LAB — BASIC METABOLIC PANEL
Anion gap: 8 (ref 5–15)
BUN: 5 mg/dL — ABNORMAL LOW (ref 6–20)
CALCIUM: 8.9 mg/dL (ref 8.9–10.3)
CO2: 30 mmol/L (ref 22–32)
Chloride: 102 mmol/L (ref 101–111)
Creatinine, Ser: 0.97 mg/dL (ref 0.61–1.24)
GFR calc non Af Amer: >60 mL/min (ref >60)
Glucose, Bld: 107 mg/dL — ABNORMAL HIGH (ref 65–99)
Potassium: 4.1 mmol/L (ref 3.5–5.1)
Sodium: 140 mmol/L (ref 135–145)

## 2015-07-29 LAB — AEROBIC/ANAEROBIC CULTURE W GRAM STAIN (SURGICAL/DEEP WOUND)

## 2015-07-29 LAB — CBC
HEMATOCRIT: 39.2 % (ref 39.0–52.0)
HEMOGLOBIN: 12.8 g/dL — AB (ref 13.0–17.0)
MCH: 30.1 pg (ref 26.0–34.0)
MCHC: 32.7 g/dL (ref 30.0–36.0)
MCV: 92.2 fL (ref 78.0–100.0)
Platelets: 365 10*3/uL (ref 150–400)
RBC: 4.25 MIL/uL (ref 4.22–5.81)
RDW: 13.2 % (ref 11.5–15.5)
WBC: 8.9 10*3/uL (ref 4.0–10.5)

## 2015-07-29 LAB — AEROBIC/ANAEROBIC CULTURE (SURGICAL/DEEP WOUND)

## 2015-07-29 LAB — PROTIME-INR
INR: 1.33 (ref 0.00–1.49)
Prothrombin Time: 16.6 seconds — ABNORMAL HIGH (ref 11.6–15.2)

## 2015-07-29 MED ORDER — OXYCODONE-ACETAMINOPHEN 5-325 MG PO TABS: 1.0000 | ORAL_TABLET | Freq: Four times a day (QID) | ORAL | Status: DC | PRN

## 2015-07-29 MED ORDER — WARFARIN - PHYSICIAN DOSING INPATIENT: Freq: Every day | Status: DC

## 2015-07-29 MED ORDER — DIGOXIN 125 MCG PO TABS: 0.1250 mg | ORAL_TABLET | Freq: Every day | ORAL | Status: DC

## 2015-07-29 MED ORDER — LISINOPRIL 5 MG PO TABS: 5.0000 mg | ORAL_TABLET | Freq: Every day | ORAL | Status: DC

## 2015-07-29 MED ORDER — WARFARIN SODIUM 5 MG PO TABS: 2.5000 mg | ORAL_TABLET | ORAL | Status: DC

## 2015-07-29 MED ORDER — FUROSEMIDE 40 MG PO TABS: 40.0000 mg | ORAL_TABLET | Freq: Every day | ORAL | Status: DC | PRN

## 2015-07-29 MED ORDER — DILTIAZEM HCL ER COATED BEADS 180 MG PO CP24: 180.0000 mg | ORAL_CAPSULE | Freq: Every day | ORAL | Status: DC

## 2015-07-29 MED ORDER — BACITRACIN-NEOMYCIN-POLYMYXIN OINTMENT TUBE: 1.0000 "application " | TOPICAL_OINTMENT | CUTANEOUS | Status: DC

## 2015-07-29 MED ORDER — WARFARIN SODIUM 2.5 MG PO TABS: 2.5000 mg | ORAL_TABLET | ORAL | Status: DC

## 2015-07-29 MED ORDER — AMOXICILLIN-POT CLAVULANATE 875-125 MG PO TABS: 1.0000 | ORAL_TABLET | Freq: Two times a day (BID) | ORAL | Status: DC

## 2015-07-29 MED ORDER — WARFARIN SODIUM 5 MG PO TABS: 5.0000 mg | ORAL_TABLET | ORAL | Status: DC

## 2015-07-29 MED ORDER — AMOXICILLIN-POT CLAVULANATE 875-125 MG PO TABS
1.0000 | ORAL_TABLET | Freq: Two times a day (BID) | ORAL | Status: DC
  Administered 2015-07-29: 1 via ORAL
  Filled 2015-07-29 (×2): qty 1

## 2015-07-29 MED ORDER — VANCOMYCIN 50 MG/ML ORAL SOLUTION: 125.0000 mg | Freq: Four times a day (QID) | ORAL | Status: AC

## 2015-07-29 MED ORDER — METOPROLOL TARTRATE 100 MG PO TABS: 100.0000 mg | ORAL_TABLET | Freq: Two times a day (BID) | ORAL | Status: DC

## 2015-07-29 MED ORDER — WARFARIN SODIUM 5 MG PO TABS: 5.0000 mg | ORAL_TABLET | Freq: Every day | ORAL | Status: DC

## 2015-07-29 MED ORDER — DIGOXIN 125 MCG PO TABS
0.1250 mg | ORAL_TABLET | Freq: Every day | ORAL | Status: DC
Start: 2015-07-29 — End: 2015-07-29

## 2015-07-29 MED ORDER — BACITRACIN-NEOMYCIN-POLYMYXIN OINTMENT TUBE: 1.0000 "application " | TOPICAL_OINTMENT | Freq: Every day | CUTANEOUS | Status: DC

## 2015-07-29 MED ORDER — WARFARIN SODIUM 5 MG PO TABS: 2.5000 mg | ORAL_TABLET | Freq: Every day | ORAL | Status: DC

## 2015-07-29 MED FILL — WARFARIN SODIUM 5 MG TABLET: 5 | 34 days supply | Qty: 34 | Fill #0

## 2015-07-29 MED FILL — AMOX TR-K CLV 875-125 MG TA: 875-125 | 14 days supply | Qty: 28 | Fill #0

## 2015-07-29 MED FILL — OXYCODONE/APAP 5-325: 5-325 | 11 days supply | Qty: 90 | Fill #0

## 2015-07-29 MED FILL — LISINOPRIL 5 MG TABLET: 5 | 34 days supply | Qty: 34 | Fill #0

## 2015-07-29 MED FILL — DIGITEK 125 MCG TABLET: 125 | 34 days supply | Qty: 34 | Fill #0

## 2015-07-29 MED FILL — VANCOMYCIN HCL 125 MG CAP: 125 | 11 days supply | Qty: 44 | Fill #0

## 2015-07-29 MED FILL — CARTIA XT 180 MG CAPSULE SA: 180 | 34 days supply | Qty: 34 | Fill #0

## 2015-07-29 MED FILL — METOPROLOL TARTRATE 100 MG: 100 | 34 days supply | Qty: 68 | Fill #0

## 2015-07-29 MED FILL — FUROSEMIDE 40 MG TABLET: 40 | 30 days supply | Qty: 30 | Fill #0

## 2015-07-29 NOTE — Progress Notes (Signed)
Subjective: Feeling a lot better  Objective: Vital signs in last 24 hours: Temp:  [97.8 F (36.6 C)-98.9 F (37.2 C)] 97.8 F (36.6 C) (07/12 0727) Pulse Rate:  [69-91] 77 (07/12 0727) Resp:  [14-25] 18 (07/12 0727) BP: (99-115)/(56-78) 103/66 mmHg (07/12 0727) SpO2:  [94 %-97 %] 95 % (07/12 0727) Last BM Date: 07/28/15 (colostomy)  Intake/Output from previous day: 07/11 0701 - 07/12 0700 In: 3897 [P.O.:597; I.V.:3150; IV Piggyback:150] Out: 3390 [Urine:3375; Drains:15] Intake/Output this shift: Total I/O In: -  Out: 800 [Urine:800]  General appearance: alert and cooperative Cardio: irregularly irregular rhythm GI: soft, LLQ JP with purulent D/C, less erythema  Lab Results:   Recent Labs  07/28/15 0539 07/29/15 0420  WBC 12.7* 8.9  HGB 13.1 12.8*  HCT 40.3 39.2  PLT 382 365   BMET  Recent Labs  07/28/15 0539 07/29/15 0420  NA 138 140  K 4.5 4.1  CL 101 102  CO2 30 30  GLUCOSE 139* 107*  BUN 8 5*  CREATININE 1.04 0.97  CALCIUM 9.2 8.9   PT/INR  Recent Labs  07/28/15 0539 07/29/15 0420  LABPROT 17.5* 16.6*  INR 1.43 1.33   ABG No results for input(s): PHART, HCO3 in the last 72 hours.  Invalid input(s): PCO2, PO2  Studies/Results: Ct Image Guided Drainage By Percutaneous Catheter  07/27/2015  CLINICAL DATA:  History of multiple percutaneous drainage catheter placements for abdominal abscesses following prior colonic resection for infarction. Development of new left lower quadrant subcutaneous abscess extending to the skin with additional deeper component in the left lower abdomen. EXAM: CT GUIDED DRAINAGE OF LEFT LOWER QUADRANT SUPERFICIAL ABSCESS ANESTHESIA/SEDATION: 2.0 Mg IV Versed 50 mcg IV Fentanyl Total Moderate Sedation Time:  15 minutes The patient's level of consciousness and physiologic status were continuously monitored during the procedure by Radiology nursing. PROCEDURE: The procedure, risks, benefits, and alternatives were  explained to the patient. Questions regarding the procedure were encouraged and answered. The patient understands and consents to the procedure. The left lower abdominal wall was prepped with chlorhexidine in a sterile fashion, and a sterile drape was applied covering the operative field. A sterile gown and sterile gloves were used for the procedure. Local anesthesia was provided with 1% Lidocaine. CT was performed of the lower abdomen and upper pelvis in a supine position. An 18 gauge trocar needle was advanced into a superficial left lower quadrant abscess. A guidewire was advanced. A 12 French drain was then advanced over the wire and formed. The drain was connected to a suction bulb and secured at the skin with a Prolene retention suture. COMPLICATIONS: None FINDINGS: Due to persistent elevated INR despite vitamin K treatment, decision was made to currently drain in the more superficial subcutaneous left lower quadrant abscess under CT guidance. The deeper thin abscess adjacent to small bowel would be more difficult to drain and was not addressed today due to the elevated INR. There was immediate drainage of grossly purulent fluid from the subcutaneous abscess. A sample of fluid was sent for culture analysis. After placing a 12 French drain in the collection, there is copious return of purulent fluid with suction bulb drainage. IMPRESSION: 1. CT-guided percutaneous drainage performed today of the superficial left lower quadrant abscess with placement of a 12 French drain. There was return of a large amount of purulent fluid and a sample was sent for culture analysis. 2. The deeper and thin left-sided intra-abdominal abscess was not attempted to be drained today due to persistent elevation  of INR. This collection would also technically be difficult to drain given its shape and location. It is possible that drainage of the superficial collection along with antibiotic treatment may help to resolve the intra  abdominal collection. Electronically Signed   By: Aletta Edouard M.D.   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 Donne Hazel in the office at [place]. They will call him with the time and date.  LOS: 4 days    Swayzee Wadley E 07/29/2015

## 2015-07-29 NOTE — Progress Notes (Signed)
Referring Physician(s): Dr Georganna Skeans  Supervising Physician: Corrie Mckusick  Patient Status:  Inpatient  Chief Complaint:  1. CT-guided percutaneous drainage performed of the superficial left lower quadrant abscess with placement of a 12 French drain 07/27/15  Subjective:  Feels better today No flushes since yesterday No leaking per pt Less painful abdomen Less full feeling Skin less red Plan for dc today per pt To follow with Dr Donne Hazel   Allergies: Contrast media  Medications: Prior to Admission medications   Medication Sig Start Date End Date Taking? Authorizing Provider  digoxin (LANOXIN) 0.125 MG tablet Take 1 tablet (0.125 mg total) by mouth daily. 04/21/15  Yes Scott Joylene Draft, PA-C  diltiazem (CARDIZEM CD) 180 MG 24 hr capsule Take 1 capsule (180 mg total) by mouth daily. 06/22/15  Yes Thompson Grayer, MD  feeding supplement (BOOST / RESOURCE BREEZE) LIQD Take 1 Container by mouth daily.   Yes Historical Provider, MD  FIBER SELECT GUMMIES CHEW Chew 1 tablet by mouth daily.   Yes Historical Provider, MD  furosemide (LASIX) 40 MG tablet Take 1 tablet (40 mg total) by mouth daily as needed for edema. Patient taking differently: Take 40 mg by mouth daily.  06/22/15  Yes Thompson Grayer, MD  lisinopril (PRINIVIL,ZESTRIL) 5 MG tablet Take 1 tablet (5 mg total) by mouth daily. 04/21/15  Yes Scott Joylene Draft, PA-C  metoprolol (LOPRESSOR) 100 MG tablet Take 1 tablet (100 mg total) by mouth 2 (two) times daily. 03/23/15  Yes Sherran Needs, NP  Multiple Vitamin (MULTIVITAMIN WITH MINERALS) TABS tablet Take 1 tablet by mouth daily.   Yes Historical Provider, MD  neomycin-bacitracin-polymyxin (NEOSPORIN) OINT Apply 1 application topically daily. For abdominal wound Patient taking differently: Apply 1 application topically every 3 (three) days. For abdominal wound 04/19/15  Yes Riccardo Dubin, MD  warfarin (COUMADIN) 5 MG tablet Take 1 tablet (5 mg total) by mouth as directed. Patient  taking differently: Take 2.5-5 mg by mouth daily at 6 PM. Take 1/2 tablet (2.5 mg) by mouth on Fridays, take 1 tablet (5 mg) on all other days of the week 07/03/15  Yes Thompson Grayer, MD     Vital Signs: BP 103/66 mmHg  Pulse 77  Temp(Src) 97.8 F (36.6 C) (Oral)  Resp 18  Ht 5\' 10"  (1.778 m)  Wt 265 lb (120.203 kg)  BMI 38.02 kg/m2  SpO2 95%  Physical Exam  Constitutional: He is oriented to person, place, and time.  Abdominal: Soft. Bowel sounds are normal. There is no tenderness.  Neurological: He is alert and oriented to person, place, and time.  Skin: Skin is warm and dry.  Site of LLQ abscess drain intact NT no bleeding No leaking  Output 15 cc today 5 cc in JP; thin brown fluid   Nursing note and vitals reviewed.  Cx multiple orgs Wbc wnl afeb  Imaging: Dg Chest 2 View  07/25/2015  CLINICAL DATA:  Multiple abdominal surgeries with colon resection and complications over past several months, fever, LEFT lower quadrant pain, shortness of breath for 8 days, history chronic systolic heart failure, cardiomyopathy, atrial fibrillation EXAM: CHEST  2 VIEW COMPARISON:  04/16/2015 FINDINGS: Enlargement of cardiac silhouette. Mediastinal contours and pulmonary vascularity normal. Peribronchial thickening with LEFT basilar atelectasis. Chronic accentuation of interstitial markings little changed. No definite acute infiltrate, pleural effusion or pneumothorax. Osseous demineralization. IMPRESSION: Mild enlargement of cardiac silhouette. Mild bronchitic changes with LEFT basilar atelectasis. Electronically Signed   By: Crist Infante.D.  On: 07/25/2015 14:42   Ct Abdomen Pelvis W Contrast  07/25/2015  CLINICAL DATA:  Left abdominal pain/ swelling, evaluate for abscess EXAM: CT ABDOMEN AND PELVIS WITH CONTRAST TECHNIQUE: Multidetector CT imaging of the abdomen and pelvis was performed using the standard protocol following bolus administration of intravenous contrast. CONTRAST:  173mL  ISOVUE-300 IOPAMIDOL (ISOVUE-300) INJECTION 61% COMPARISON:  04/23/2015 FINDINGS: Lower chest: Mild patchy subpleural opacity in the left lower lobe, favored to reflect atelectasis/scarring. Associated trace left pleural fluid versus pleural thickening. Hepatobiliary: Liver is notable for a 12 mm cyst in the posterior right hepatic lobe (series 2/image 27), unchanged. Gallbladder is unremarkable. No intrahepatic or extrahepatic ductal dilatation. Pancreas: Within normal limits. Spleen: Within normal limits. Adrenals/Urinary Tract: Adrenal glands are within normal limits. Kidneys are within normal limits.  No hydronephrosis. Bladder is mildly thick-walled although underdistended. Stomach/Bowel: Stomach is within normal limits. 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 No evidence of bowel obstruction. Normal appendix (series 2/image 59). Vascular/Lymphatic: No evidence of abdominal aortic aneurysm. No suspicious abdominopelvic lymphadenopathy. Reproductive: Prostate is unremarkable. Other: No abdominopelvic ascites. Interval removal of three prior pigtail drainage catheters. 2.7 x 3.0 x 6.8 cm fluid collection/abscess adjacent to bowel in the left lower abdomen, anterior to the left psoas muscle (series 2/image 68), at the site of a prior pigtail drainage catheter. Additional inflammatory changes extending superiorly to a thin, vertical 1.9 x 1.4 x 6.3 cm fluid collection in the left mid abdomen, anterior to the left kidney (series 2/ image 44), at the site of the prior pigtail drainage catheter. Additional inflammatory changes extending superiorly with a small foci of gas tracking adjacent to the pancreatic tail (series 2/ image 35). Inflammatory changes also extend superiorly to the stomach (series 2/ image 31). From the initial collection, inflammatory changes also extend inferiorly to an elongated 2.3 x 8.2 x 2.7 cm fluid collection extending through the left lateral  abdominal wall (series 2/image 32), extending to the skin surface (series 2/image 74), along a prior drain tract. These findings are new from prior CT. Diastases of the midline anterior abdominal wall. Musculoskeletal: Visualized osseous structures are within normal limits. IMPRESSION: 2.7 x 3.0 x 6.8 cm fluid collection/abscess in the left mid abdomen, anterior to the left psoas muscle and adjacent to bowel, at the site of a prior pigtail drainage catheter. Associated 2.3 x 8.2 x 2.7 cm fluid collection extending through the left lateral abdominal wall to the skin surface, along a prior drain tract. Additional 1.9 x 1.4 x 6.3 cm fluid collection with associated tiny foci of gas in the left mid abdomen, anterior to the left kidney, at the site of a prior pigtail drainage catheter. Electronically Signed   By: Julian Hy M.D.   On: 07/25/2015 20:10   Ct Image Guided Drainage By Percutaneous Catheter  07/27/2015  CLINICAL DATA:  History of multiple percutaneous drainage catheter placements for abdominal abscesses following prior colonic resection for infarction. Development of new left lower quadrant subcutaneous abscess extending to the skin with additional deeper component in the left lower abdomen. EXAM: CT GUIDED DRAINAGE OF LEFT LOWER QUADRANT SUPERFICIAL ABSCESS ANESTHESIA/SEDATION: 2.0 Mg IV Versed 50 mcg IV Fentanyl Total Moderate Sedation Time:  15 minutes The patient's level of consciousness and physiologic status were continuously monitored during the procedure by Radiology nursing. PROCEDURE: The procedure, risks, benefits, and alternatives were explained to the patient. Questions regarding the procedure were encouraged and answered. The patient understands and consents to  the procedure. The left lower abdominal wall was prepped with chlorhexidine in a sterile fashion, and a sterile drape was applied covering the operative field. A sterile gown and sterile gloves were used for the procedure. Local  anesthesia was provided with 1% Lidocaine. CT was performed of the lower abdomen and upper pelvis in a supine position. An 18 gauge trocar needle was advanced into a superficial left lower quadrant abscess. A guidewire was advanced. A 12 French drain was then advanced over the wire and formed. The drain was connected to a suction bulb and secured at the skin with a Prolene retention suture. COMPLICATIONS: None FINDINGS: Due to persistent elevated INR despite vitamin K treatment, decision was made to currently drain in the more superficial subcutaneous left lower quadrant abscess under CT guidance. The deeper thin abscess adjacent to small bowel would be more difficult to drain and was not addressed today due to the elevated INR. There was immediate drainage of grossly purulent fluid from the subcutaneous abscess. A sample of fluid was sent for culture analysis. After placing a 12 French drain in the collection, there is copious return of purulent fluid with suction bulb drainage. IMPRESSION: 1. CT-guided percutaneous drainage performed today of the superficial left lower quadrant abscess with placement of a 12 French drain. There was return of a large amount of purulent fluid and a sample was sent for culture analysis. 2. The deeper and thin left-sided intra-abdominal abscess was not attempted to be drained today due to persistent elevation of INR. This collection would also technically be difficult to drain given its shape and location. It is possible that drainage of the superficial collection along with antibiotic treatment may help to resolve the intra abdominal collection. Electronically Signed   By: Aletta Edouard M.D.   On: 07/27/2015 18:14    Labs:  CBC:  Recent Labs  07/26/15 0548 07/27/15 0457 07/28/15 0539 07/29/15 0420  WBC 15.9* 19.0* 12.7* 8.9  HGB 13.3 13.2 13.1 12.8*  HCT 41.2 40.9 40.3 39.2  PLT 353 393 382 365    COAGS:  Recent Labs  04/13/15 1730  07/26/15 0548  07/27/15 1121 07/28/15 0539 07/29/15 0420  INR 2.62*  < > 2.25* 1.89* 1.43 1.33  APTT 49*  --   --   --   --   --   < > = values in this interval not displayed.  BMP:  Recent Labs  07/26/15 0548 07/27/15 0457 07/28/15 0539 07/29/15 0420  NA 133* 136 138 140  K 4.5 3.6 4.5 4.1  CL 106 101 101 102  CO2 22 27 30 30   GLUCOSE 181* 126* 139* 107*  BUN 17 17 8  5*  CALCIUM 8.8* 8.9 9.2 8.9  CREATININE 0.93 0.81 1.04 0.97  GFRNONAA >60 >60 >60 >60  GFRAA >60 >60 >60 >60    LIVER FUNCTION TESTS:  Recent Labs  04/14/15 0504 04/15/15 0605 07/25/15 1441 07/26/15 0548  BILITOT 0.6 0.4 0.5 0.7  AST 13* 13* 18 13*  ALT 23 18 22 17   ALKPHOS 59 55 88 74  PROT 7.1 6.6 8.4* 7.0  ALBUMIN 2.5* 2.3* 3.3* 2.6*    Assessment and Plan:  LLQ abscess drain placed 7/10 Better daily Will follow in IR drain clinic-- he will hear from scheduler for time and date  Electronically Signed: Kambria Grima A 07/29/2015, 11:09 AM   I spent a total of 15 Minutes at the the patient's bedside AND on the patient's hospital floor or unit, greater than 50%  of which was counseling/coordinating care for LLQ abscess drain

## 2015-07-29 NOTE — Progress Notes (Signed)
Pt been discharged home. Discharge instructions given. IV access discontinued. Telemetry discontinued. Belongings returned. Pt been transported home by family. Questions answered. Pt stable at time of discharge.

## 2015-07-29 NOTE — Progress Notes (Signed)
Subjective: Continues to feel significantly better, reduced pain and swelling at drain site. Good appetite and ate full breakfast this morning. Some leakage around drain yesterday but output has slowed today (drain bulb changed 3-4x yesterday and 1x overnight). Cleared for dc from surgical/IR standpoint, 14 day course Augmentin and will follow up with Dr. Donne Hazel and IR clinic. Clear for discharge this afternoon.   Objective: Vital signs in last 24 hours: Filed Vitals:   07/29/15 0200 07/29/15 0333 07/29/15 0600 07/29/15 0727  BP: 99/66 108/71 103/66 103/66  Pulse: 71 73 80 77  Temp:  98.2 F (36.8 C)  97.8 F (36.6 C)  TempSrc:  Oral  Oral  Resp: 19 21 17 18   Height:      Weight:      SpO2: 96% 97% 95% 95%    Intake/Output Summary (Last 24 hours) at 07/29/15 [id number] Last data filed at 07/29/15 0600  Gross per 24 hour  Intake   3747 ml  Output   3390 ml  Net    357 ml   Physical Exam General appearance: Obese gentleman sitting in bed, appears stated age, conversational Cardiovascular: Irregular heartbeat, no audible murmurs, no extremity edema Respiratory: Clear to auscultation bilaterally, normal work of breathing Abdomen: Obese, soft, normal bowel sounds, bandaged RLQ with drain and suction bulb in place,  large midline erythematous scar, colostomy bag intact in RLQ Skin: Warm, dry, intact Neuro: Cranial nerves grossly intact, alert and oriented Psych: Normal affect  Labs / Imaging / Procedures: CBC Latest Ref Rng 07/29/2015 07/28/2015 07/27/2015  WBC 4.0 - 10.5 K/uL 8.9 12.7(H) 19.0(H)  Hemoglobin 13.0 - 17.0 g/dL 12.8(L) 13.1 13.2  Hematocrit 39.0 - 52.0 % 39.2 40.3 40.9  Platelets 150 - 400 K/uL 365 382 393   BMP Latest Ref Rng 07/29/2015 07/28/2015 07/27/2015  Glucose 65 - 99 mg/dL 107(H) 139(H) 126(H)  BUN 6 - 20 mg/dL 5(L) 8 17  Creatinine 0.61 - 1.24 mg/dL 0.97 1.04 0.81  Sodium 135 - 145 mmol/L 140 138 136  Potassium 3.5 - 5.1 mmol/L 4.1 4.5 3.6  Chloride 101 -  111 mmol/L 102 101 101  CO2 22 - 32 mmol/L 30 30 27   Calcium 8.9 - 10.3 mg/dL 8.9 9.2 8.9    Assessment/Plan: Mr. 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 He presents again with intraabdominal abscess that likely requires surgical or IR intervention.   1. Intraabdominal abscess, recurrent, worsening, two pockets of fluid within the left mid abdomen, s/p IR drain placement on 07/27/15  -  Augmentin BID for 14 days  - 1-2 tablets Oxycodone-Acetaminophen (Percocet) Q4hr PRN for pain control  - PRN Zofran for nausea  - Patient reports the is comfortable changing his bandages and has supplies at home  - Will be contacted for f/up appts with Dr. Donne Hazel and IR clinic  2. C. Diff colitis, pcr/antigen positve, toxin negative  - Continue po Vanc 125mg  QID, continue for total 10-14days, would end on 7/22   4. Afib, rate controlled and anticoagulated  - Continue home diltiazem, digoxin, metoprolol  - Resume home coumadin  5. Depression, less controlled in setting of acute medical stressors  - Continue home Lexapro  6. Pre-diabetes, HbA1c 6.0, blood sugars have not been running high during hospitalization - unclear why SSI was started  - DC SSI and FSBG checks  DVTppx:  SCDs  FEN: Heart healthy diet  Dispo: Anticipated discharge in approximately 3-4 day(s).   LOS: 4 days   [name], MD 07/29/2015, 8:12 AM Pager: [phone]

## 2015-07-29 NOTE — Care Management Note (Signed)
Case Management Note  Patient Details  Name: COLSON MARCILLE MRN: [id number] Date of Birth: Jan 06, 1966  Subjective/Objective:     Per financial counselor, pt's medicaid termed @ end of June.  Provided [place] letter for 34-day supply of meds and pt plans to fill scripts @ [facility].  Pt will follow-up with [facility] and will need orange card while he completes applications for disability and medicaid.  Provided pt with instructions for completing application for orange card and contacting [facility] for appointment.                             Expected Discharge Plan:  Home/Self Care  In-House Referral:  Financial Counselor  Discharge planning Services  CM Consult, Medication Assistance  Status of Service:  Completed, signed off  Girard Cooter, [place] 07/29/2015, 2:50 PM

## 2015-07-29 NOTE — Discharge Instructions (Signed)

## 2015-07-30 ENCOUNTER — Other Ambulatory Visit: Payer: Self-pay | Admitting: Radiology

## 2015-07-30 DIAGNOSIS — A0471 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, recurrent: Secondary | ICD-10-CM | POA: Diagnosis present

## 2015-07-30 DIAGNOSIS — Z9889 Other specified postprocedural states: Secondary | ICD-10-CM

## 2015-07-30 LAB — CULTURE, BLOOD (ROUTINE X 2): CULTURE: NO GROWTH

## 2015-07-30 MED ORDER — DIPHENHYDRAMINE HCL 25 MG PO TABS: 50.0000 mg | ORAL_TABLET | Freq: Four times a day (QID) | ORAL | Status: DC | PRN

## 2015-07-30 MED ORDER — PREDNISONE 50 MG PO TABS: ORAL_TABLET | ORAL | Status: DC

## 2015-07-30 NOTE — Discharge Summary (Signed)
Name: William Fischer MRN: [id number] DOB: 1965/05/12 50 y.o. PCP: Norman Herrlich, MD  Date of Admission: 07/25/2015  3:56 PM Date of Discharge: 07/30/2015 Attending Physician: No att. providers found  Discharge Diagnosis: 1. Intra-abdominal abscess   Principal Problem:   Abdominal abscess ([place]) Active Problems:   Atrial fibrillation ([place])   Colostomy in place ([place])   Sepsis ([place])   Enteritis due to Clostridium difficile  Discharge Medications:   Medication List    TAKE these medications        amoxicillin-clavulanate 875-125 MG tablet  Commonly known as:  AUGMENTIN  Take 1 tablet by mouth every 12 (twelve) hours.     digoxin 0.125 MG tablet  Commonly known as:  LANOXIN  Take 1 tablet (0.125 mg total) by mouth daily.     diltiazem 180 MG 24 hr capsule  Commonly known as:  CARDIZEM CD  Take 1 capsule (180 mg total) by mouth daily.     feeding supplement Liqd  Take 1 Container by mouth daily.     FIBER SELECT GUMMIES Chew  Chew 1 tablet by mouth daily.     furosemide 40 MG tablet  Commonly known as:  LASIX  Take 1 tablet (40 mg total) by mouth daily as needed for edema.     lisinopril 5 MG tablet  Commonly known as:  PRINIVIL,ZESTRIL  Take 1 tablet (5 mg total) by mouth daily.     metoprolol 100 MG tablet  Commonly known as:  LOPRESSOR  Take 1 tablet (100 mg total) by mouth 2 (two) times daily.     multivitamin with minerals Tabs tablet  Take 1 tablet by mouth daily.     neomycin-bacitracin-polymyxin Oint  Commonly known as:  NEOSPORIN  Apply 1 application topically every 3 (three) days. For abdominal wound     oxyCODONE-acetaminophen 5-325 MG tablet  Commonly known as:  PERCOCET/ROXICET  Take 1-2 tablets by mouth every 6 (six) hours as needed for severe pain.     vancomycin 50 mg/mL oral solution  Commonly known as:  VANCOCIN  Take 2.5 mLs (125 mg total) by mouth 4 (four) times daily.     warfarin 5 MG tablet  Commonly known as:  COUMADIN  Take 1  tablet (5 mg total) by mouth daily at 6 PM. Take 1/2 tablet (2.5 mg) by mouth on Fridays, take 1 tablet (5 mg) on all other days of the week        Disposition and follow-up:   William Fischer was discharged from [facility] in Stable condition.  At the hospital follow up visit please address:  1.  Intra-abdominal abscess, superficial drain placed by IR on 7/10 with good output, some leakage around drain catheter, discharged on 14 day course of Augmentin, will require repeat CT Abd/pelvis to assess after drainage stops, given percocet rx for pain control  - C. Diff colitis - pt was having purulent colostomy output on presentation, was found to be c. Diff antigen positive, toxin negative, and reflex testing PCR was positive, started on po Vancomycin  - PAF, resumed home warfarin at discharge (initially held for IR procedure)  - Depression - poor morale initially during hospital stay, fatigue, arguments with staff and stress  - Medicaid/insurance status - discovered that patient had lost medicaid status at discharge (due to son turning 56?) and set up with [employer] and given printed Rx and free 34 day supply of his medications  2.  Labs /  imaging needed at time of follow-up: CBC, BMP, PT-INR, CT Abd/Pelv w Con  3.  Pending labs/ test needing follow-up: Abscess culture and sensitivities  Follow-up Appointments:     Follow-up Information    Follow up with [facility], MD In 2 weeks.   Specialty:  Interventional Radiology   Why:  pt will hear from scheduler for time and date for follow up in IR drain clinic   Contact information:   [address] [place] [address] [phone]       Follow up with Richardson Dopp, PA-C. Go on 08/03/2015.   Specialties:  Cardiology, Physician Assistant   Why:  At 8:15 AM   Contact information:   [id number] [place] [address] [phone]       Follow up with Rolm Bookbinder, MD.    Specialty:  General Surgery   Why:  pt will hear from scheduler for time and date for follow up appointment   Contact information:   [address] [place] [address] [phone]       Follow up with [facility].   Why:  Scheduler should call to set up PCP/hospital f/up appoint   Contact information:   [address] [facility] Hospital Course by problem list:  1. Intra-abdominal abscess - recurrent (s/p drains, po and IV/PICC abx with transient resolution) -  Presented on 7/8, 3rd admission since having a L colectomy and end colostomy in Dec 2016 for perforated ischemic colitis, followed by surgeon Dr. Donne Hazel. He presented with worsening LLQ abdominal pain, swelling, erythema, fevers, leukocytosis, hypotension/sepsis similar to previous admissions. CTAP showed two deep L intra-abdominal abscesses that communicated/extended to superficial pocket in LLQ along tract of previous drain, stabilized with IVF boluses and IV Zosyn, pain control with IV Dilaudid and po Percocet. Warfarin held pending decision for surgery vs IR/drain, eventually required vitamin K to drop INR below 2 and went to IR for CT-guided superficial drain placement on 7/10, symptoms and pain markedly improved and he had significant purulent drain output. Ongoing clinical improvement and drainage allowed for discharge with po Augmentin regimen on 07/29/15.  2. C diff colitis - concern given purulent colostomy output and recent antibiotic exposure, testing revealed equivocal results (toxin negative, but antigen and reflex PCR positive) so Vancomycin po was begun, continued on discharge  3. PAF - continued home diltiazem, digoxin, metoprolol throughout admission, warfarin held for procedure and restarted by discharge  4. Depression - in significant distress on admission, stress from medical illness, lack of sleep, pain, thinking about pursuing  palliative route. Mood much improved with adequate pain control, sleep, and drain placement/progress.   Discharge Vitals:   BP 111/82 mmHg  Pulse 64  Temp(Src) 98.1 F (36.7 C) (Axillary)  Resp 14  Ht 5\' 10"  (1.778 m)  Wt 120.203 kg (265 lb)  BMI 38.02 kg/m2  SpO2 98%  Pertinent Labs, Studies, and Procedures:   CBC Latest Ref Rng 07/29/2015 07/28/2015 07/27/2015  WBC 4.0 - 10.5 K/uL 8.9 12.7(H) 19.0(H)  Hemoglobin 13.0 - 17.0 g/dL 12.8(L) 13.1 13.2  Hematocrit 39.0 - 52.0 % 39.2 40.3 40.9  Platelets 150 - 400 K/uL 365 382 393   BMP Latest Ref Rng 07/29/2015 07/28/2015 07/27/2015  Glucose 65 - 99 mg/dL 107(H) 139(H) 126(H)  BUN 6 - 20 mg/dL 5(L) 8 17  Creatinine 0.61 - 1.24 mg/dL 0.97 1.04 0.81  Sodium  135 - 145 mmol/L 140 138 136  Potassium 3.5 - 5.1 mmol/L 4.1 4.5 3.6  Chloride 101 - 111 mmol/L 102 101 101  CO2 22 - 32 mmol/L 30 30 27   Calcium 8.9 - 10.3 mg/dL 8.9 9.2 8.9       Ref Range 4d ago  68mo ago     C Diff antigen NEGATIVE  POSITIVE (A) NEGATIVE    C Diff toxin NEGATIVE  NEGATIVE NEGATIVE    C Diff interpretation  Results are indeterminate. See PC... Negative for toxigenic C. difficile           Ref Range 4d ago    Toxigenic C Difficile by pcr NEGATIVE  POSITIVE (A)        CT Abd Pelvis w Con - 07/25/2015 IMPRESSION: 2.7 x 3.0 x 6.8 cm fluid collection/abscess in the left mid abdomen, anterior to the left psoas muscle and adjacent to bowel, at the site of a prior pigtail drainage catheter.  Associated 2.3 x 8.2 x 2.7 cm fluid collection extending through the left lateral abdominal wall to the skin surface, along a prior drain tract.  Additional 1.9 x 1.4 x 6.3 cm fluid collection with associated tiny foci of gas in the left mid abdomen, anterior to the left kidney, at the site of a prior pigtail drainage catheter.  Interventional Radiology Procedure Note - 07/27/15 - Dr. Aletta Edouard  Procedure: CT guided drainage of LLQ superficial  abscess  Complications: None  Estimated Blood Loss: < 10 mL  Given still high INR of 1.89 after Vit K, decision made to drain the dominant superficial component of LLQ abscess today. The deeper component is tall and thin, and potentially difficult to drain currently.  Grossly purulent fluid aspirated and sent for culture studies. 12 Fr perc drain placed in superficial abscess. Attached to suction bulb.  Will follow. May not need separate drainage of the deeper peritoneal component if improves with antibiotics. Recomm follow-up CT after drainage from current drain subsides.   Discharge Instructions: Discharge Instructions    Call MD for:  difficulty breathing, headache or visual disturbances    Complete by:  As directed      Call MD for:  extreme fatigue    Complete by:  As directed      Call MD for:  persistant nausea and vomiting    Complete by:  As directed      Call MD for:  redness, tenderness, or signs of infection (pain, swelling, redness, odor or green/yellow discharge around incision site)    Complete by:  As directed      Call MD for:  severe uncontrolled pain    Complete by:  As directed      Call MD for:  temperature >100.4    Complete by:  As directed      Diet - low sodium heart healthy    Complete by:  As directed      Discharge instructions    Complete by:  As directed   Please continue to take your medications a prescribed and attend your scheduled follow up appointments. We have given you a limited prescription for Percocet to manage your abdominal pain, do your best to taper this down and stop as tolerated. Please contact us or visit the ER if you develop acute abdominal pan, swelling, or fevers.     Increase activity slowly    Complete by:  As directed  Signed: [name], MD 07/30/2015, 9:22 PM   Pager: [phone]

## 2015-07-31 NOTE — Progress Notes (Signed)
Cardiology Office Note:    Date:  08/03/2015   ID:  William Fischer, DOB 1965/03/12, MRN [id number]  PCP:  Julious Oka, MD  Cardiologist: Dr. Thompson Grayer Electrophysiologist: Dr. Thompson Grayer  AFib Clinic:  Roderic Palau, NP  Gen Surgeon - Dr. Donne Hazel  Referring MD: No ref. provider found   Chief Complaint  Patient presents with  . Congestive Heart Failure    Follow-up  . Hospitalization Follow-up    Abdominal abscess    History of Present Illness:    William Fischer is a 50 y.o. male with a hx of chronic AFib (failed DCCV x 2, failed Sotalol) treated with rate control only, tachycardia induced NICM, obesity, tobacco abuse. He is not felt to be a candidate for PVI ablation secondary to severe left atrial enlargement. His EF has recovered in the past with adequate rate control. He has declined sleep apnea evaluation in the past.   Admitted in 12/16 with perforated bowel and intraabdominal abscess c/b AF with RVR and worsening LV function with related HFrEF. He underwent exploratory laparotomy with left colectomy and colostomy.  Etiology of his presentation was felt to be related to necrotic/ischemic bowel instead of diverticulitis and he was transitioned from Pradaxa to Coumadin. He had labile INRs and was placed on Pradaxa again but ultimately switched back to Coumadin after review with Dr. Thompson Grayer.  He has had several readmissions with recurrent sepsis since 12/16.  In 3/17, he developed AF with RVR during admission to the hospital and briefly required IV Amiodarone for rate control.   Last seen in this office by Dr. Rayann Heman 06/22/15. Diltiazem was increased for better rate control. He was set up for follow-up with me today.  In the interim, he was readmitted 7/8-7/13 with sepsis in the setting of recurrent intra-abdominal abscess and C. difficile colitis. He underwent drain placement and was treated with IV antibiotics as well as by mouth vancomycin. Of note, Coumadin  was held for drain placement and was resumed at discharge.  Returns for FU.     Past Medical History  Diagnosis Date  . Chicken pox as a child  . Migraine 06/29/2011  . Obesity 06/29/2011  . Depression with anxiety 06/29/2011  . Atrial fibrillation ([place])     admx 11/13 with acute sCHF in setting of RVR  => a. failed DCCV x 2; b. Pradaxa started;  c. failed sotalol  . Chronic systolic heart failure ([place])     a. echo 11/13: Ef 40-45%, diff HK, mod MR, mod LAE, mild RVE, mod RAE, small effusion;   b. TEE 11/13:  EF 35-40%, no LAA clot; Echo 2/14 shows normal EF  . Cardiomyopathy ([place])     likely tachy mediated in setting of AF with RVR  . Snoring     patient needs sleep study - has declined  . Allergy to IVP dye   . Chronic anticoagulation     Pradaxa    Past Surgical History  Procedure Laterality Date  . Tee without cardioversion  11/30/2011    Procedure: TRANSESOPHAGEAL ECHOCARDIOGRAM (TEE);  Surgeon: Thayer Headings, MD;  Location: [place];  Service: Cardiovascular;  Laterality: N/A;  . Cardioversion  11/30/2011    Procedure: CARDIOVERSION;  Surgeon: Thayer Headings, MD;  Location: [facility];  Service: Cardiovascular;  Laterality: N/A;  . Cardioversion  12/03/2011    Procedure: CARDIOVERSION;  Surgeon: Jolaine Artist, MD;  Location: [facility];  Service: Cardiovascular;  Laterality: N/A;  . Laparotomy N/A  01/17/2015    Procedure: EXPLORATORY LAPAROTOMY WITH LEFT COLECTOMY AND COLOSTOMY;  Surgeon: Rolm Bookbinder, MD;  Location: [facility];  Service: General;  Laterality: N/A;    Current Medications: Outpatient Prescriptions Prior to Visit  Medication Sig Dispense Refill  . amoxicillin-clavulanate (AUGMENTIN) 875-125 MG tablet Take 1 tablet by mouth every 12 (twelve) hours. 28 tablet 0  . digoxin (LANOXIN) 0.125 MG tablet Take 1 tablet (0.125 mg total) by mouth daily. 34 tablet 0  . diltiazem (CARDIZEM CD) 180 MG 24 hr capsule Take 1 capsule (180 mg total) by mouth daily. 34  capsule 0  . diphenhydrAMINE (BENADRYL) 25 MG tablet Take 2 tablets (50 mg total) by mouth every 6 (six) hours as needed. Benadryl 50 mg po 1 hr prior to CT. 2 tablet 0  . feeding supplement (BOOST / RESOURCE BREEZE) LIQD Take 1 Container by mouth daily.    [name] FIBER SELECT GUMMIES CHEW Chew 1 tablet by mouth daily.    . furosemide (LASIX) 40 MG tablet Take 1 tablet (40 mg total) by mouth daily as needed for edema. 30 tablet 0  . lisinopril (PRINIVIL,ZESTRIL) 5 MG tablet Take 1 tablet (5 mg total) by mouth daily. 34 tablet 0  . metoprolol (LOPRESSOR) 100 MG tablet Take 1 tablet (100 mg total) by mouth 2 (two) times daily. 68 tablet 0  . Multiple Vitamin (MULTIVITAMIN WITH MINERALS) TABS tablet Take 1 tablet by mouth daily.    [name] neomycin-bacitracin-polymyxin (NEOSPORIN) OINT Apply 1 application topically every 3 (three) days. For abdominal wound 1 Tube 0  . oxyCODONE-acetaminophen (PERCOCET/ROXICET) 5-325 MG tablet Take 1-2 tablets by mouth every 6 (six) hours as needed for severe pain. 90 tablet 0  . predniSONE (DELTASONE) 50 MG tablet Prednisone 50 mg po 13 hrs, 7 hrs and 1 hr prior to CT. 3 tablet 0  . vancomycin (VANCOCIN) 50 mg/mL oral solution Take 2.5 mLs (125 mg total) by mouth 4 (four) times daily. 110 mL 0  . warfarin (COUMADIN) 5 MG tablet Take 1 tablet (5 mg total) by mouth daily at 6 PM. Take 1/2 tablet (2.5 mg) by mouth on Fridays, take 1 tablet (5 mg) on all other days of the week 34 tablet 0   No facility-administered medications prior to visit.      Allergies:   Contrast media   Social History   Social History  . Marital Status: Divorced    Spouse Name: N/A  . Number of Children: N/A  . Years of Education: N/A   Social History Main Topics  . Smoking status: Former Smoker -- 1.00 packs/day for 20 years    Types: Cigarettes    Quit date: 07/10/2014  . Smokeless tobacco: Never Used  . Alcohol Use: No  . Drug Use: No  . Sexual Activity: No   Other Topics Concern  . Not  on file   Social History Narrative     Family History:  The patient's family history includes Alcohol abuse in his father and paternal grandfather; Aneurysm in his father; Cancer in his maternal grandmother and paternal grandmother; Diabetes in his maternal grandfather and paternal grandmother; Hypertension in his father and mother; Parkinsonism in his maternal grandfather.   ROS:   Please see the history of present illness.    ROS All other systems reviewed and are negative.   Physical Exam:    VS:  There were no vitals taken for this visit.    Wt Readings from Last 3 Encounters:  07/26/15 265 lb (  120.203 kg)  06/22/15 259 lb (117.482 kg)  04/21/15 241 lb 6.4 oz (109.498 kg)     Physical Exam  Studies/Labs Reviewed:   EKG:  EKG is  ordered today.  The ekg ordered today demonstrates   Recent Labs: 01/26/2015: B Natriuretic Peptide 301.9* 04/18/2015: Magnesium 2.0 07/26/2015: ALT 17 07/29/2015: BUN 5*; Creatinine, Ser 0.97; Hemoglobin 12.8*; Platelets 365; Potassium 4.1; Sodium 140   Recent Lipid Panel    Component Value Date/Time   CHOL 149 07/16/2013 1412   TRIG 156* 01/17/2015 1923   HDL 46.20 07/16/2013 1412   CHOLHDL 3 07/16/2013 1412   VLDL 16.0 07/16/2013 1412   LDLCALC 87 07/16/2013 1412    Additional studies/ records that were reviewed today include:    Echo 12/16 Poor image quality, EF 45-50%, mild MR, moderate LAE, small lateral pericardial effusion, atrial septal lipomatous hypertrophy  Echo 3/15 EF 55-60%, mild MR, severe LAE, moderate RAE  Echo 2/14 Mild LVH, EF 50-55%, normal wall motion, mild MR, moderate LAE, mild RAE  Echo 11/13 EF 40%, diffuse HK, moderate MR, moderate LAE, mildly reduced RVSF, moderate RAE, small pericardial effusion  ASSESSMENT:    1. Chronic atrial fibrillation ([place])   2. NICM (nonischemic cardiomyopathy) ([place])   3. Chronic systolic heart failure ([facility])   4. Abdominal abscess ([facility])    PLAN:    In order of problems  listed above:  1. Chronic AFib - Several recent admissions with intra-abdominal infections stemming from perforated ischemic bowel resulting in uncontrolled VR. Last admission with C diff colitis as well.  He was just DC again 7/13.   -    2. NICM - Related to RVR.  EF has recovered with controlled VR in the past.    3. Chronic Systolic CHF - HFrEF in the setting of tachycardia induced DCM.   -    - Continue beta-blocker, ACE inhibitor, Digoxin  4. Abdominal abscess - Related to perforated ischemic bowel s/p laparotomy with hemicolectomy. Now with intra-abdominal drains. FU with Dr. Donne Hazel as planned.    Medication Adjustments/Labs and Tests Ordered: Current medicines are reviewed at length with the patient today.  Concerns regarding medicines are outlined above.  Medication changes, Labs and Tests ordered today are outlined in the Patient Instructions noted below. There are no Patient Instructions on file for this visit. Signed, Richardson Dopp, PA-C  08/03/2015 8:06 AM    [facility] [place], [place], [place]  [address] Phone: [phone]; Fax: [phone]     This encounter was created in error - please disregard.

## 2015-08-03 ENCOUNTER — Encounter: Payer: Self-pay | Admitting: Physician Assistant

## 2015-08-03 DIAGNOSIS — I428 Other cardiomyopathies: Secondary | ICD-10-CM | POA: Insufficient documentation

## 2015-08-10 ENCOUNTER — Ambulatory Visit (INDEPENDENT_AMBULATORY_CARE_PROVIDER_SITE_OTHER): Payer: Self-pay

## 2015-08-10 DIAGNOSIS — I482 Chronic atrial fibrillation, unspecified: Secondary | ICD-10-CM

## 2015-08-10 DIAGNOSIS — Z7901 Long term (current) use of anticoagulants: Secondary | ICD-10-CM

## 2015-08-10 DIAGNOSIS — Z5181 Encounter for therapeutic drug level monitoring: Secondary | ICD-10-CM

## 2015-08-10 DIAGNOSIS — I4891 Unspecified atrial fibrillation: Secondary | ICD-10-CM

## 2015-08-10 LAB — POCT INR: INR: 1.7

## 2015-08-11 ENCOUNTER — Ambulatory Visit
Admission: RE | Admit: 2015-08-11 | Discharge: 2015-08-11 | Disposition: A | Discharge status: Home / Self Care | Payer: No Typology Code available for payment source | Source: Ambulatory Visit | Attending: Radiology | Admitting: Radiology

## 2015-08-11 ENCOUNTER — Ambulatory Visit
Admission: RE | Admit: 2015-08-11 | Discharge: 2015-08-11 | Disposition: A | Discharge status: Home / Self Care | Payer: No Typology Code available for payment source | Source: Ambulatory Visit | Attending: General Surgery | Admitting: General Surgery

## 2015-08-11 DIAGNOSIS — K651 Peritoneal abscess: Secondary | ICD-10-CM

## 2015-08-11 DIAGNOSIS — IMO0002 Reserved for concepts with insufficient information to code with codable children: Secondary | ICD-10-CM

## 2015-08-11 HISTORY — PX: IR GENERIC HISTORICAL: IMG1180011

## 2015-08-11 MED ORDER — IOPAMIDOL (ISOVUE-300) INJECTION 61%
125.0000 mL | Freq: Once | INTRAVENOUS | Status: AC | PRN
  Administered 2015-08-11: 125 mL via INTRAVENOUS

## 2015-08-11 NOTE — Progress Notes (Signed)
Patient ID: William Fischer, male   DOB: 06-24-1965, 50 y.o.   MRN: [id number]       Chief Complaint: Patient was seen in consultation for drain follow-up at the request of Turpin,Pamela  Referring Physician(s): Turpin,Pamela  History of Present Illness: OREL Fischer is a 50 y.o. male who is well-known to the IR service. Patient has a history of ischemic colitis requiring partial colonic resection and colostomy. The patient has had multiple abdominal abscess drainage catheters. Patient currently has a drainage catheter in the left lower abdominal subcutaneous tissues. Patient denies fevers or chills. Patient reports no significant drainage from this catheter. Patient has no new complaints.  Past Medical History:  Diagnosis Date  . Allergy to IVP dye   . Atrial fibrillation ([place])    admx 11/13 with acute sCHF in setting of RVR  => a. failed DCCV x 2; b. Pradaxa started;  c. failed sotalol  . Cardiomyopathy ([place])    likely tachy mediated in setting of AF with RVR  . Chicken pox as a child  . Chronic anticoagulation    Pradaxa  . Chronic systolic heart failure ([place])    a. echo 11/13: Ef 40-45%, diff HK, mod MR, mod LAE, mild RVE, mod RAE, small effusion;   b. TEE 11/13:  EF 35-40%, no LAA clot; Echo 2/14 shows normal EF  . Depression with anxiety 06/29/2011  . Migraine 06/29/2011  . Obesity 06/29/2011  . Snoring    patient needs sleep study - has declined    Past Surgical History:  Procedure Laterality Date  . CARDIOVERSION  11/30/2011   Procedure: CARDIOVERSION;  Surgeon: Thayer Headings, MD;  Location: [facility];  Service: Cardiovascular;  Laterality: N/A;  . CARDIOVERSION  12/03/2011   Procedure: CARDIOVERSION;  Surgeon: Jolaine Artist, MD;  Location: [facility];  Service: Cardiovascular;  Laterality: N/A;  . LAPAROTOMY N/A 01/17/2015   Procedure: EXPLORATORY LAPAROTOMY WITH LEFT COLECTOMY AND COLOSTOMY;  Surgeon: Rolm Bookbinder, MD;  Location: [facility];  Service: General;   Laterality: N/A;  . TEE WITHOUT CARDIOVERSION  11/30/2011   Procedure: TRANSESOPHAGEAL ECHOCARDIOGRAM (TEE);  Surgeon: Thayer Headings, MD;  Location: [facility] ENDOSCOPY;  Service: Cardiovascular;  Laterality: N/A;    Allergies: Contrast media [iodinated diagnostic agents]  Medications: Prior to Admission medications   Medication Sig Start Date End Date Taking? Authorizing Provider  amoxicillin-clavulanate (AUGMENTIN) 875-125 MG tablet Take 1 tablet by mouth every 12 (twelve) hours. 07/29/15   [name], MD  digoxin (LANOXIN) 0.125 MG tablet Take 1 tablet (0.125 mg total) by mouth daily. 07/29/15   [name], MD  diltiazem (CARDIZEM CD) 180 MG 24 hr capsule Take 1 capsule (180 mg total) by mouth daily. 07/29/15   [name], MD  diphenhydrAMINE (BENADRYL) 25 MG tablet Take 2 tablets (50 mg total) by mouth every 6 (six) hours as needed. Benadryl 50 mg po 1 hr prior to CT. 07/30/15   Aletta Edouard, MD  feeding supplement (BOOST / RESOURCE BREEZE) LIQD Take 1 Container by mouth daily.    Historical Provider, MD  FIBER SELECT GUMMIES CHEW Chew 1 tablet by mouth daily.    Historical Provider, MD  furosemide (LASIX) 40 MG tablet Take 1 tablet (40 mg total) by mouth daily as needed for edema. 07/29/15   [name], MD  lisinopril (PRINIVIL,ZESTRIL) 5 MG tablet Take 1 tablet (5 mg total) by mouth daily. 07/29/15   [name], MD  metoprolol (LOPRESSOR) 100 MG tablet Take 1 tablet (100  mg total) by mouth 2 (two) times daily. 07/29/15   [name], MD  Multiple Vitamin (MULTIVITAMIN WITH MINERALS) TABS tablet Take 1 tablet by mouth daily.    Historical Provider, MD  neomycin-bacitracin-polymyxin (NEOSPORIN) OINT Apply 1 application topically every 3 (three) days. For abdominal wound 07/29/15   [name], MD  oxyCODONE-acetaminophen (PERCOCET/ROXICET) 5-325 MG tablet Take 1-2 tablets by mouth every 6 (six) hours as needed for severe pain. 07/29/15   [name], MD  predniSONE (DELTASONE) 50 MG  tablet Prednisone 50 mg po 13 hrs, 7 hrs and 1 hr prior to CT. 07/30/15   Aletta Edouard, MD  warfarin (COUMADIN) 5 MG tablet Take 1 tablet (5 mg total) by mouth daily at 6 PM. Take 1/2 tablet (2.5 mg) by mouth on Fridays, take 1 tablet (5 mg) on all other days of the week 07/29/15   [name], MD     Family History  Problem Relation Age of Onset  . Hypertension Mother   . Hypertension Father   . Aneurysm Father   . Alcohol abuse Father   . Cancer Maternal Grandmother     brain  . Diabetes Maternal Grandfather     type 2  . Parkinsonism Maternal Grandfather   . Cancer Paternal Grandmother     breast  . Diabetes Paternal Grandmother   . Alcohol abuse Paternal Grandfather     Social History   Social History  . Marital status: Divorced    Spouse name: N/A  . Number of children: N/A  . Years of education: N/A   Social History Main Topics  . Smoking status: Former Smoker    Packs/day: 1.00    Years: 20.00    Types: Cigarettes    Quit date: 07/10/2014  . Smokeless tobacco: Never Used  . Alcohol use No  . Drug use: No  . Sexual activity: No   Other Topics Concern  . Not on file   Social History Narrative  . No narrative on file     Review of Systems  Constitutional: Negative for chills and fever.  Gastrointestinal:       Mild pain at drain site.    Vital Signs: BP (!) 144/80 (BP Location: Right Arm)   Pulse 81   Temp 98.1 F (36.7 C) ([place])   SpO2 97%   Physical Exam  Abdominal:  Left lower quadrant drain is intact. No significant drainage or erythema around the drain. Abdomen is soft. Colostomy bag is intact on the right side of abdomen. Old incision along the abdominal midline.         Imaging: Dg Chest 2 View  Result Date: 07/25/2015 CLINICAL DATA:  Multiple abdominal surgeries with colon resection and complications over past several months, fever, LEFT lower quadrant pain, shortness of breath for 8 days, history chronic systolic heart failure,  cardiomyopathy, atrial fibrillation EXAM: CHEST  2 VIEW COMPARISON:  04/16/2015 FINDINGS: Enlargement of cardiac silhouette. Mediastinal contours and pulmonary vascularity normal. Peribronchial thickening with LEFT basilar atelectasis. Chronic accentuation of interstitial markings little changed. No definite acute infiltrate, pleural effusion or pneumothorax. Osseous demineralization. IMPRESSION: Mild enlargement of cardiac silhouette. Mild bronchitic changes with LEFT basilar atelectasis. Electronically Signed   By: Lavonia Dana M.D.   On: 07/25/2015 14:42   Ct Abdomen Pelvis W Contrast  Result Date: 08/11/2015 CLINICAL DATA:  Complex medical history related to colonic resection for infarction. The patient has had multiple percutaneous drainage catheters for abdominal abscesses. The patient currently has a drain within  a subcutaneous collection in the left lower abdomen. Patient presents for follow-up. Patient reports minimal drainage from this catheter. EXAM: CT ABDOMEN AND PELVIS WITH CONTRAST TECHNIQUE: Multidetector CT imaging of the abdomen and pelvis was performed using the standard protocol following bolus administration of intravenous contrast. CONTRAST:  134mL ISOVUE-300 IOPAMIDOL (ISOVUE-300) INJECTION 61% COMPARISON:  07/25/2015 FINDINGS: Lower chest: Punctate 3 mm nodule along the posterior right lower lobe on sequence 4, image 10. This appears stable since 01/14/2015. Mild scarring or atelectasis at the left lung base. Hepatobiliary: Stable 1.2 cm low density structure in the right hepatic lobe is suggestive for a cyst. Otherwise, normal appearance of the liver. Small amount of high-density material in the gallbladder could represent a small stone. No significant biliary or gallbladder dilatation. Pancreas: Again noted is some low-density fluid or mild inflammatory changes along the superior aspect of the pancreatic tail and along the the posterior aspect of the fundus. Small poorly defined  collection along the fundus measures 2.2 x 1.3 cm on sequence 3, image 19. Spleen: Normal appearance of the spleen. Adrenals/Urinary Tract: Stable appearance of the adrenal glands with mild fullness or small nodule in the right adrenal gland which is unchanged. Normal appearance both kidneys without suspicious lesion or hydronephrosis. Normal appearance of the urinary bladder. Stomach/Bowel: Normal appearance of stomach except for the small fluid collection along the left posterior aspect of the fundus. This fluid collection is more organized compared to the previous examination. There is no significant bowel dilatation. Again is a colostomy in the right abdomen. Vascular/Lymphatic: No suspicious lymphadenopathy in the abdomen or pelvis. No significant atherosclerotic calcifications in the aorta and visceral arteries. No aortic aneurysm. Reproductive: Normal appearance of the prostate and seminal vesicles. Other: There is no significant free fluid. The abscess collection in the left lower abdomen involving the subcutaneous tissues has resolved. There continues to be a pigtail drain in this area. There is a poorly defined fluid collection in the left lower abdomen adjacent loops of bowel and surgical clips. This collection is smaller and measures 2.3 x 1.6 cm and previously measured 2.7 x 3.0 cm. In general, the inflammatory changes along the left side of the abdominal cavity have decreased. Previously, there was small fluid collection along the left side the left kidney which has resolved. Previously, there was a small air collection near the tail of the pancreas which is no longer present. Stable laxity along the anterior abdominal wall with postoperative changes. Musculoskeletal: No acute bone abnormality. IMPRESSION: The abscess collection in the left lower abdominal wall and subcutaneous tissues has resolved. The drainage catheter remains in place. Most of the inflammatory changes and small intra-abdominal fluid  collections along the left side of the abdomen have improved or resolved. However, there is one small collection that has slightly enlarged or more organizing adjacent to the stomach. Recommend attention to this area on follow up imaging. Stable 3 mm nodule at the right lung base. This is indeterminate but unchanged since 01/14/2015. No follow-up needed if patient is low-risk. Non-contrast chest CT can be considered in 12 months if patient is high-risk. This recommendation follows the consensus statement: Guidelines for Management of Incidental Pulmonary Nodules Detected on CT Images:From the Fleischner Society 2017; published online before print (10.1148/radiol.SG:5268862). Electronically Signed   By: Markus Daft M.D.   On: 08/11/2015 11:28  Ct Abdomen Pelvis W Contrast  Result Date: 07/25/2015 CLINICAL DATA:  Left abdominal pain/ swelling, evaluate for abscess EXAM: CT ABDOMEN AND PELVIS WITH CONTRAST TECHNIQUE:  Multidetector CT imaging of the abdomen and pelvis was performed using the standard protocol following bolus administration of intravenous contrast. CONTRAST:  138mL ISOVUE-300 IOPAMIDOL (ISOVUE-300) INJECTION 61% COMPARISON:  04/23/2015 FINDINGS: Lower chest: Mild patchy subpleural opacity in the left lower lobe, favored to reflect atelectasis/scarring. Associated trace left pleural fluid versus pleural thickening. Hepatobiliary: Liver is notable for a 12 mm cyst in the posterior right hepatic lobe (series 2/image 27), unchanged. Gallbladder is unremarkable. No intrahepatic or extrahepatic ductal dilatation. Pancreas: Within normal limits. Spleen: Within normal limits. Adrenals/Urinary Tract: Adrenal glands are within normal limits. Kidneys are within normal limits.  No hydronephrosis. Bladder is mildly thick-walled although underdistended. Stomach/Bowel: Stomach is within normal limits. 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 No evidence of bowel  obstruction. Normal appendix (series 2/image 59). Vascular/Lymphatic: No evidence of abdominal aortic aneurysm. No suspicious abdominopelvic lymphadenopathy. Reproductive: Prostate is unremarkable. Other: No abdominopelvic ascites. Interval removal of three prior pigtail drainage catheters. 2.7 x 3.0 x 6.8 cm fluid collection/abscess adjacent to bowel in the left lower abdomen, anterior to the left psoas muscle (series 2/image 68), at the site of a prior pigtail drainage catheter. Additional inflammatory changes extending superiorly to a thin, vertical 1.9 x 1.4 x 6.3 cm fluid collection in the left mid abdomen, anterior to the left kidney (series 2/ image 44), at the site of the prior pigtail drainage catheter. Additional inflammatory changes extending superiorly with a small foci of gas tracking adjacent to the pancreatic tail (series 2/ image 35). Inflammatory changes also extend superiorly to the stomach (series 2/ image 31). From the initial collection, inflammatory changes also extend inferiorly to an elongated 2.3 x 8.2 x 2.7 cm fluid collection extending through the left lateral abdominal wall (series 2/image 32), extending to the skin surface (series 2/image 74), along a prior drain tract. These findings are new from prior CT. Diastases of the midline anterior abdominal wall. Musculoskeletal: Visualized osseous structures are within normal limits. IMPRESSION: 2.7 x 3.0 x 6.8 cm fluid collection/abscess in the left mid abdomen, anterior to the left psoas muscle and adjacent to bowel, at the site of a prior pigtail drainage catheter. Associated 2.3 x 8.2 x 2.7 cm fluid collection extending through the left lateral abdominal wall to the skin surface, along a prior drain tract. Additional 1.9 x 1.4 x 6.3 cm fluid collection with associated tiny foci of gas in the left mid abdomen, anterior to the left kidney, at the site of a prior pigtail drainage catheter. Electronically Signed   By: Julian Hy M.D.    On: 07/25/2015 20:10   Dg Sinus/fist Tube Chk-non Gi  Result Date: 08/11/2015 INDICATION: History of intra-abdominal abscesses related to colonic infarction and partial colonic resection. Follow-up subcutaneous abscess in the left lower abdomen. Patient reports no output from this drain. EXAM: ABSCESS INJECTION MEDICATIONS: None ANESTHESIA/SEDATION: None FLUOROSCOPY TIME:  18 seconds COMPLICATIONS: None immediate. PROCEDURE: Patient was placed supine on the fluoroscopic table. Approximately 5 cc of Omnipaque 300 was injected into the catheter under fluoroscopic guidance. Majority of the contrast was aspirated. The catheter was cut and completely removed. Bandage was placed over the catheter exit site. FINDINGS: Drain injection demonstrates preferential drainage around the catheter and onto the skin. No residual abscess cavity. There is no evidence for a fistula connection into the intra-abdominal cavity or bowel structures. IMPRESSION: Resolution of the abscess cavity and no evidence for a bowel fistula. The drainage catheter was removed. Electronically Signed  By: Markus Daft M.D.   On: 08/11/2015 11:36  Ct Image Guided Drainage By Percutaneous Catheter  Result Date: 07/27/2015 CLINICAL DATA:  History of multiple percutaneous drainage catheter placements for abdominal abscesses following prior colonic resection for infarction. Development of new left lower quadrant subcutaneous abscess extending to the skin with additional deeper component in the left lower abdomen. EXAM: CT GUIDED DRAINAGE OF LEFT LOWER QUADRANT SUPERFICIAL ABSCESS ANESTHESIA/SEDATION: 2.0 Mg IV Versed 50 mcg IV Fentanyl Total Moderate Sedation Time:  15 minutes The patient's level of consciousness and physiologic status were continuously monitored during the procedure by Radiology nursing. PROCEDURE: The procedure, risks, benefits, and alternatives were explained to the patient. Questions regarding the procedure were encouraged and  answered. The patient understands and consents to the procedure. The left lower abdominal wall was prepped with chlorhexidine in a sterile fashion, and a sterile drape was applied covering the operative field. A sterile gown and sterile gloves were used for the procedure. Local anesthesia was provided with 1% Lidocaine. CT was performed of the lower abdomen and upper pelvis in a supine position. An 18 gauge trocar needle was advanced into a superficial left lower quadrant abscess. A guidewire was advanced. A 12 French drain was then advanced over the wire and formed. The drain was connected to a suction bulb and secured at the skin with a Prolene retention suture. COMPLICATIONS: None FINDINGS: Due to persistent elevated INR despite vitamin K treatment, decision was made to currently drain in the more superficial subcutaneous left lower quadrant abscess under CT guidance. The deeper thin abscess adjacent to small bowel would be more difficult to drain and was not addressed today due to the elevated INR. There was immediate drainage of grossly purulent fluid from the subcutaneous abscess. A sample of fluid was sent for culture analysis. After placing a 12 French drain in the collection, there is copious return of purulent fluid with suction bulb drainage. IMPRESSION: 1. CT-guided percutaneous drainage performed today of the superficial left lower quadrant abscess with placement of a 12 French drain. There was return of a large amount of purulent fluid and a sample was sent for culture analysis. 2. The deeper and thin left-sided intra-abdominal abscess was not attempted to be drained today due to persistent elevation of INR. This collection would also technically be difficult to drain given its shape and location. It is possible that drainage of the superficial collection along with antibiotic treatment may help to resolve the intra abdominal collection. Electronically Signed   By: Aletta Edouard M.D.   On: 07/27/2015  18:14    Labs:  CBC:  Recent Labs  07/26/15 0548 07/27/15 0457 07/28/15 0539 07/29/15 0420  WBC 15.9* 19.0* 12.7* 8.9  HGB 13.3 13.2 13.1 12.8*  HCT 41.2 40.9 40.3 39.2  PLT 353 393 382 365    COAGS:  Recent Labs  04/13/15 1730  07/27/15 1121 07/28/15 0539 07/29/15 0420 08/10/15 1638  INR 2.62*  < > 1.89* 1.43 1.33 1.7  APTT 49*  --   --   --   --   --   < > = values in this interval not displayed.  BMP:  Recent Labs  07/26/15 0548 07/27/15 0457 07/28/15 0539 07/29/15 0420  NA 133* 136 138 140  K 4.5 3.6 4.5 4.1  CL 106 101 101 102  CO2 22 27 30 30   GLUCOSE 181* 126* 139* 107*  BUN 17 17 8  5*  CALCIUM 8.8* 8.9 9.2 8.9  CREATININE 0.93  0.81 1.04 0.97  GFRNONAA >60 >60 >60 >60  GFRAA >60 >60 >60 >60    LIVER FUNCTION TESTS:  Recent Labs  04/14/15 0504 04/15/15 0605 07/25/15 1441 07/26/15 0548  BILITOT 0.6 0.4 0.5 0.7  AST 13* 13* 18 13*  ALT 23 18 22 17   ALKPHOS 59 55 88 74  PROT 7.1 6.6 8.4* 7.0  ALBUMIN 2.5* 2.3* 3.3* 2.6*    TUMOR MARKERS: No results for input(s): AFPTM, CEA, CA199, CHROMGRNA in the last 8760 hours.  Assessment and Plan:  Abdominal drain and abscesses: CT scan demonstrates that the subcutaneous abscess in left lower abdomen has resolved. Overall, there are decreased inflammatory changes along the left side of the abdomen. Small residual fluid collection in the left lower abdomen is decreasing in size. However, there is a new or more organized small collection adjacent to the stomach. Currently, the patient is asymptomatic. Drain injection showed no connection to the intra-abdominal fluid collections. No evidence for a bowel fistula. As a result, the left lower abdominal subcutaneous drain was completely removed.  Patient is reported scheduled for a colostomy take down in the upcoming months. Recommend a follow-up CT of the abdomen and pelvis with contrast prior to any surgical intervention to ensure that there is no  reaccumulation of fluid collection or abscess collections within the abdomen. In particular, there is concern for a new developing small fluid collection adjacent to the stomach.  Thank you for this interesting consult.  I greatly enjoyed meeting TYRA GAITHER and look forward to participating in their care.  A copy of this report was sent to the requesting provider on this date.  Electronically Signed: Carylon Perches 08/11/2015, 1:05 PM    I spent a total of  10 Minutes in face to face in clinical consultation, greater than 50% of which was counseling/coordinating care for drain care.

## 2015-08-14 ENCOUNTER — Ambulatory Visit: Payer: Self-pay

## 2015-08-14 ENCOUNTER — Encounter: Payer: Self-pay | Admitting: Internal Medicine

## 2015-08-31 ENCOUNTER — Ambulatory Visit (INDEPENDENT_AMBULATORY_CARE_PROVIDER_SITE_OTHER): Payer: Self-pay | Admitting: *Deleted

## 2015-08-31 DIAGNOSIS — I482 Chronic atrial fibrillation, unspecified: Secondary | ICD-10-CM

## 2015-08-31 DIAGNOSIS — Z5181 Encounter for therapeutic drug level monitoring: Secondary | ICD-10-CM

## 2015-08-31 DIAGNOSIS — I4891 Unspecified atrial fibrillation: Secondary | ICD-10-CM

## 2015-08-31 DIAGNOSIS — Z7901 Long term (current) use of anticoagulants: Secondary | ICD-10-CM

## 2015-08-31 LAB — POCT INR: INR: 1.8

## 2015-09-09 ENCOUNTER — Encounter: Payer: Self-pay | Admitting: *Deleted

## 2015-09-18 DIAGNOSIS — K432 Incisional hernia without obstruction or gangrene: Secondary | ICD-10-CM | POA: Diagnosis not present

## 2015-09-18 DIAGNOSIS — K651 Peritoneal abscess: Secondary | ICD-10-CM | POA: Diagnosis not present

## 2015-09-18 DIAGNOSIS — Z5321 Procedure and treatment not carried out due to patient leaving prior to being seen by health care provider: Secondary | ICD-10-CM | POA: Diagnosis not present

## 2015-09-18 DIAGNOSIS — R52 Pain, unspecified: Secondary | ICD-10-CM | POA: Diagnosis not present

## 2015-09-18 DIAGNOSIS — Z7901 Long term (current) use of anticoagulants: Secondary | ICD-10-CM | POA: Diagnosis not present

## 2015-09-18 DIAGNOSIS — F329 Major depressive disorder, single episode, unspecified: Secondary | ICD-10-CM | POA: Diagnosis not present

## 2015-09-18 DIAGNOSIS — A419 Sepsis, unspecified organism: Secondary | ICD-10-CM | POA: Diagnosis not present

## 2015-09-18 DIAGNOSIS — R509 Fever, unspecified: Secondary | ICD-10-CM | POA: Diagnosis not present

## 2015-09-18 DIAGNOSIS — I482 Chronic atrial fibrillation: Secondary | ICD-10-CM | POA: Diagnosis not present

## 2015-09-18 DIAGNOSIS — Z933 Colostomy status: Secondary | ICD-10-CM | POA: Diagnosis not present

## 2015-09-18 DIAGNOSIS — Y838 Other surgical procedures as the cause of abnormal reaction of the patient, or of later complication, without mention of misadventure at the time of the procedure: Secondary | ICD-10-CM | POA: Diagnosis not present

## 2015-09-18 DIAGNOSIS — T814XXA Infection following a procedure, initial encounter: Secondary | ICD-10-CM | POA: Diagnosis not present

## 2015-09-18 DIAGNOSIS — I4891 Unspecified atrial fibrillation: Secondary | ICD-10-CM | POA: Diagnosis not present

## 2015-09-18 DIAGNOSIS — Z87891 Personal history of nicotine dependence: Secondary | ICD-10-CM | POA: Diagnosis not present

## 2015-09-18 DIAGNOSIS — R1084 Generalized abdominal pain: Secondary | ICD-10-CM | POA: Diagnosis not present

## 2015-09-18 DIAGNOSIS — E669 Obesity, unspecified: Secondary | ICD-10-CM | POA: Diagnosis not present

## 2015-09-18 DIAGNOSIS — Z9049 Acquired absence of other specified parts of digestive tract: Secondary | ICD-10-CM | POA: Diagnosis not present

## 2015-09-18 DIAGNOSIS — R109 Unspecified abdominal pain: Secondary | ICD-10-CM | POA: Diagnosis not present

## 2015-09-19 DIAGNOSIS — R52 Pain, unspecified: Secondary | ICD-10-CM | POA: Diagnosis not present

## 2015-09-19 DIAGNOSIS — K651 Peritoneal abscess: Secondary | ICD-10-CM | POA: Diagnosis not present

## 2015-09-19 DIAGNOSIS — I4891 Unspecified atrial fibrillation: Secondary | ICD-10-CM | POA: Diagnosis not present

## 2015-09-19 DIAGNOSIS — A419 Sepsis, unspecified organism: Secondary | ICD-10-CM | POA: Diagnosis not present

## 2015-09-20 DIAGNOSIS — K651 Peritoneal abscess: Secondary | ICD-10-CM | POA: Diagnosis not present

## 2015-09-20 DIAGNOSIS — A419 Sepsis, unspecified organism: Secondary | ICD-10-CM | POA: Diagnosis not present

## 2015-09-20 DIAGNOSIS — I4891 Unspecified atrial fibrillation: Secondary | ICD-10-CM | POA: Diagnosis not present

## 2015-09-20 DIAGNOSIS — R52 Pain, unspecified: Secondary | ICD-10-CM | POA: Diagnosis not present

## 2015-09-23 ENCOUNTER — Ambulatory Visit (INDEPENDENT_AMBULATORY_CARE_PROVIDER_SITE_OTHER): Payer: BLUE CROSS/BLUE SHIELD | Admitting: Internal Medicine

## 2015-09-23 ENCOUNTER — Encounter: Payer: Self-pay | Admitting: Internal Medicine

## 2015-09-23 ENCOUNTER — Ambulatory Visit: Payer: BLUE CROSS/BLUE SHIELD | Admitting: Pharmacist

## 2015-09-23 VITALS — BP 128/70 | HR 150 | Ht 71.0 in | Wt 268.4 lb

## 2015-09-23 DIAGNOSIS — Z7901 Long term (current) use of anticoagulants: Secondary | ICD-10-CM | POA: Diagnosis not present

## 2015-09-23 DIAGNOSIS — E669 Obesity, unspecified: Secondary | ICD-10-CM

## 2015-09-23 DIAGNOSIS — I482 Chronic atrial fibrillation, unspecified: Secondary | ICD-10-CM

## 2015-09-23 DIAGNOSIS — I4891 Unspecified atrial fibrillation: Secondary | ICD-10-CM | POA: Diagnosis not present

## 2015-09-23 DIAGNOSIS — I5022 Chronic systolic (congestive) heart failure: Secondary | ICD-10-CM | POA: Diagnosis not present

## 2015-09-23 DIAGNOSIS — Z5181 Encounter for therapeutic drug level monitoring: Secondary | ICD-10-CM

## 2015-09-23 MED ORDER — RIVAROXABAN 20 MG PO TABS: 20.0000 mg | ORAL_TABLET | Freq: Every day | ORAL | 11 refills | Status: DC

## 2015-09-23 MED ORDER — DILTIAZEM HCL ER COATED BEADS 180 MG PO CP24: 180.0000 mg | ORAL_CAPSULE | Freq: Two times a day (BID) | ORAL | 3 refills | Status: DC

## 2015-09-23 NOTE — Patient Instructions (Addendum)
Medication Instructions:  Your physician has recommended you make the following change in your medication:   1) Increase Cardizem 180 mg to twice a day. 2) STOP Warfarin  3) Start Xarelto 20 mg daily  Labwork: None ordered  Testing/Procedures: None ordered   Follow-Up: Your physician recommends that you schedule a follow-up appointment in: 6 weeks with Tommye Standard, PA and 3 months with Dr. Rayann Heman.     If you need a refill on your cardiac medications before your next appointment, please call your pharmacy.

## 2015-09-23 NOTE — Progress Notes (Signed)
Electrophysiology Office Note   Date:  09/23/2015   ID:  William Fischer, DOB 08-11-1965, MRN [id number]  PCP:  Julious Oka, MD  Cardiologist:  none Primary Electrophysiologist: Thompson Grayer, MD    Chief Complaint  Patient presents with  . Atrial Fibrillation     History of Present Illness: William Fischer is a 50 y.o. male who presents today for electrophysiology evaluation.   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  He has had subsequent abdominal infection requiring drainage.  Today, he admits that he had not been compliant with pradaxa prior to his mesenteric embolism due to costs of the medicine.  He has told me on every occasion up until now that he had been fully compliant, even when in the hospital after his embolic event. I worry about his overall compliance with medicines (he has not taken rate control medicines today and his heart rates are very high).  I have been very clear about the importance of compliance with medicines today.  INRs have been nontherapeutic for 2 months on coumadin. Today, he denies symptoms of palpitations, chest pain, shortness of breath, orthopnea, PND, lower extremity edema, claudication, dizziness, presyncope, syncope, bleeding, or neurologic sequela. The patient is tolerating medications without difficulties and is otherwise without complaint today.    Past Medical History:  Diagnosis Date  . Allergy to IVP dye   . Atrial fibrillation ([place])    admx 11/13 with acute sCHF in setting of RVR  => a. failed DCCV x 2; b. Pradaxa started;  c. failed sotalol  . Cardiomyopathy ([place])    likely tachy mediated in setting of AF with RVR  . Chicken pox as a child  . Chronic anticoagulation    Pradaxa  . Chronic systolic heart failure ([place])    a. echo 11/13: Ef 40-45%, diff HK, mod MR, mod LAE, mild RVE, mod RAE, small effusion;   b. TEE 11/13:  EF 35-40%, no LAA clot; Echo 2/14 shows normal EF  . Depression with anxiety  06/29/2011  . Migraine 06/29/2011  . Obesity 06/29/2011  . Snoring    patient needs sleep study - has declined   Past Surgical History:  Procedure Laterality Date  . CARDIOVERSION  11/30/2011   Procedure: CARDIOVERSION;  Surgeon: Thayer Headings, MD;  Location: [place];  Service: Cardiovascular;  Laterality: N/A;  . CARDIOVERSION  12/03/2011   Procedure: CARDIOVERSION;  Surgeon: Jolaine Artist, MD;  Location: [place];  Service: Cardiovascular;  Laterality: N/A;  . LAPAROTOMY N/A 01/17/2015   Procedure: EXPLORATORY LAPAROTOMY WITH LEFT COLECTOMY AND COLOSTOMY;  Surgeon: Rolm Bookbinder, MD;  Location: [place];  Service: General;  Laterality: N/A;  . TEE WITHOUT CARDIOVERSION  11/30/2011   Procedure: TRANSESOPHAGEAL ECHOCARDIOGRAM (TEE);  Surgeon: Thayer Headings, MD;  [facility] ENDOSCOPY;  Service: Cardiovascular;  Laterality: N/A;     Current Outpatient Prescriptions  Medication Sig Dispense Refill  . digoxin (LANOXIN) 0.125 MG tablet Take 1 tablet (0.125 mg total) by mouth daily. 34 tablet 0  . diltiazem (CARDIZEM CD) 180 MG 24 hr capsule Take 1 capsule (180 mg total) by mouth 2 (two) times daily. 180 capsule 3  . furosemide (LASIX) 40 MG tablet Take 1 tablet (40 mg total) by mouth daily as needed for edema. 30 tablet 0  . lisinopril (PRINIVIL,ZESTRIL) 5 MG tablet Take 1 tablet (5 mg total) by mouth daily. 34 tablet 0  . metoprolol (LOPRESSOR) 100 MG tablet Take 1 tablet (100  mg total) by mouth 2 (two) times daily. 68 tablet 0  . Multiple Vitamin (MULTIVITAMIN WITH MINERALS) TABS tablet Take 1 tablet by mouth daily.    [name] warfarin (COUMADIN) 5 MG tablet Take 1 tablet (5 mg total) by mouth daily at 6 PM. Take 1/2 tablet (2.5 mg) by mouth on Fridays, take 1 tablet (5 mg) on all other days of the week 34 tablet 0   No current facility-administered medications for this visit.     Allergies:   Contrast media [iodinated diagnostic agents]   Social History:  The patient  reports  that he quit smoking about 14 months ago. His smoking use included Cigarettes. He has a 20.00 pack-year smoking history. He has never used smokeless tobacco. He reports that he does not drink alcohol or use drugs.   Family History:  The patient's  family history includes Alcohol abuse in his father and paternal grandfather; Aneurysm in his father; Cancer in his maternal grandmother and paternal grandmother; Diabetes in his maternal grandfather and paternal grandmother; Hypertension in his father and mother; Parkinsonism in his maternal grandfather.    ROS:  Please see the history of present illness.   All other systems are reviewed and negative.    PHYSICAL EXAM: VS:  BP 128/70   Pulse (!) 150   Ht 5\' 11"  (1.803 m)   Wt 268 lb 6.4 oz (121.7 kg)   BMI 37.43 kg/m  , BMI Body mass index is 37.43 kg/m. GEN: Well nourished, well developed, in no acute distress  HEENT: normal  Neck: no JVD, carotid bruits, or masses Cardiac: tachycardic irregular rhythm; no murmurs, rubs, or gallops,no edema  Respiratory:  clear to auscultation bilaterally, normal work of breathing GI: soft, healing midline wound, ostomy in place, drainage noted from a L lateral site MS: no deformity or atrophy  Skin: warm and dry  Neuro:  Strength and sensation are intact Psych: euthymic mood, full affect  EKG:  EKG is ordered today. The ekg ordered today shows afib, V rate 150 bpm, nonspecific ST/T changes   Recent Labs: 01/26/2015: B Natriuretic Peptide 301.9 04/18/2015: Magnesium 2.0 07/26/2015: ALT 17 07/29/2015: BUN 5; Creatinine, Ser 0.97; Hemoglobin 12.8; Platelets 365; Potassium 4.1; Sodium 140    Lipid Panel     Component Value Date/Time   CHOL 149 07/16/2013 1412   TRIG 156 (H) 01/17/2015 1923   HDL 46.20 07/16/2013 1412   CHOLHDL 3 07/16/2013 1412   VLDL 16.0 07/16/2013 1412   LDLCALC 87 07/16/2013 1412     Wt Readings from Last 3 Encounters:  09/23/15 268 lb 6.4 oz (121.7 kg)  07/26/15 265 lb  (120.2 kg)  06/22/15 259 lb (117.5 kg)      Other studies Reviewed: Additional studies/ records that were reviewed today include: mor recenthospital records, INRs, recent echo   ASSESSMENT AND PLAN:  1.  Longstanding persistent afib Increase diltiazem CD 180mg  BID today Importance of compliance discussed today Given subtherapeutic INRs, I have spoken at length with Fuller Canada (our pharmacist).  She advises xarelto 20mg  daily.  The patient is given a $0 co pay card for the medicine today.  Strict adherence to dosing is advised.  He is also advised to contact my office if he cannot take or afford this or any of his medicines.  2. Nonischemic CM His EF recovered with rate control previously.  I am not reluctant to use/ titrate diltiazem in this patient.  Increase diltiazem today  3. Obesity Weight loss advised  He has declined sleep study  4. Noncompliance I worry that he will not do well if he does not significant increase adherence to our medical advise and prescriptions   F/u with Richardson Dopp in six weeks  I will see again in 3 months  The patient is at risk for decompensation/ hospitalization A high level of decision making was required for the encounter today.   Current medicines are reviewed at length with the patient today.   The patient does not have concerns regarding his medicines.  The following changes were made today:  none  Signed, Thompson Grayer, MD  09/23/2015 9:03 AM     [facility] [address] [place] [address] [phone] (office) [phone] (fax)

## 2015-09-28 ENCOUNTER — Encounter (HOSPITAL_COMMUNITY): Payer: Self-pay | Admitting: *Deleted

## 2015-09-28 DIAGNOSIS — R739 Hyperglycemia, unspecified: Secondary | ICD-10-CM | POA: Diagnosis present

## 2015-09-28 DIAGNOSIS — R509 Fever, unspecified: Secondary | ICD-10-CM | POA: Diagnosis not present

## 2015-09-28 DIAGNOSIS — I428 Other cardiomyopathies: Secondary | ICD-10-CM | POA: Diagnosis present

## 2015-09-28 DIAGNOSIS — A419 Sepsis, unspecified organism: Secondary | ICD-10-CM | POA: Diagnosis not present

## 2015-09-28 DIAGNOSIS — Z7901 Long term (current) use of anticoagulants: Secondary | ICD-10-CM

## 2015-09-28 DIAGNOSIS — I482 Chronic atrial fibrillation: Secondary | ICD-10-CM | POA: Diagnosis present

## 2015-09-28 DIAGNOSIS — Z87891 Personal history of nicotine dependence: Secondary | ICD-10-CM

## 2015-09-28 DIAGNOSIS — Z79899 Other long term (current) drug therapy: Secondary | ICD-10-CM

## 2015-09-28 DIAGNOSIS — K632 Fistula of intestine: Secondary | ICD-10-CM | POA: Diagnosis not present

## 2015-09-28 DIAGNOSIS — K651 Peritoneal abscess: Secondary | ICD-10-CM | POA: Diagnosis not present

## 2015-09-28 DIAGNOSIS — Z6834 Body mass index (BMI) 34.0-34.9, adult: Secondary | ICD-10-CM | POA: Diagnosis not present

## 2015-09-28 DIAGNOSIS — K6819 Other retroperitoneal abscess: Secondary | ICD-10-CM | POA: Diagnosis not present

## 2015-09-28 DIAGNOSIS — Z933 Colostomy status: Secondary | ICD-10-CM | POA: Diagnosis not present

## 2015-09-28 DIAGNOSIS — B9689 Other specified bacterial agents as the cause of diseases classified elsewhere: Secondary | ICD-10-CM | POA: Diagnosis not present

## 2015-09-28 DIAGNOSIS — K6812 Psoas muscle abscess: Secondary | ICD-10-CM | POA: Diagnosis not present

## 2015-09-28 DIAGNOSIS — I5022 Chronic systolic (congestive) heart failure: Secondary | ICD-10-CM | POA: Diagnosis present

## 2015-09-28 DIAGNOSIS — L02211 Cutaneous abscess of abdominal wall: Secondary | ICD-10-CM | POA: Diagnosis not present

## 2015-09-28 DIAGNOSIS — Z9049 Acquired absence of other specified parts of digestive tract: Secondary | ICD-10-CM | POA: Diagnosis not present

## 2015-09-28 DIAGNOSIS — R1084 Generalized abdominal pain: Secondary | ICD-10-CM | POA: Diagnosis not present

## 2015-09-28 LAB — COMPREHENSIVE METABOLIC PANEL
ALT: 48 U/L (ref 17–63)
AST: 24 U/L (ref 15–41)
Albumin: 2.7 g/dL — ABNORMAL LOW (ref 3.5–5.0)
Alkaline Phosphatase: 94 U/L (ref 38–126)
Anion gap: 8 (ref 5–15)
BUN: 10 mg/dL (ref 6–20)
CHLORIDE: 97 mmol/L — AB (ref 101–111)
CO2: 29 mmol/L (ref 22–32)
Calcium: 9.1 mg/dL (ref 8.9–10.3)
Creatinine, Ser: 0.96 mg/dL (ref 0.61–1.24)
Glucose, Bld: 147 mg/dL — ABNORMAL HIGH (ref 65–99)
POTASSIUM: 4.7 mmol/L (ref 3.5–5.1)
SODIUM: 134 mmol/L — AB (ref 135–145)
Total Bilirubin: 0.6 mg/dL (ref 0.3–1.2)
Total Protein: 7.6 g/dL (ref 6.5–8.1)

## 2015-09-28 LAB — URINE MICROSCOPIC-ADD ON: RBC / HPF: NONE SEEN RBC/hpf (ref 0–5)

## 2015-09-28 LAB — URINALYSIS, ROUTINE W REFLEX MICROSCOPIC
BILIRUBIN URINE: NEGATIVE
GLUCOSE, UA: NEGATIVE mg/dL
Hgb urine dipstick: NEGATIVE
KETONES UR: NEGATIVE mg/dL
Leukocytes, UA: NEGATIVE
NITRITE: NEGATIVE
PH: 6 (ref 5.0–8.0)
PROTEIN: 30 mg/dL — AB
Specific Gravity, Urine: 1.029 (ref 1.005–1.030)

## 2015-09-28 LAB — CBC WITH DIFFERENTIAL/PLATELET
BASOS ABS: 0 10*3/uL (ref 0.0–0.1)
Basophils Relative: 0 %
Eosinophils Absolute: 0.2 10*3/uL (ref 0.0–0.7)
Eosinophils Relative: 1 %
HEMATOCRIT: 44.6 % (ref 39.0–52.0)
Hemoglobin: 14.3 g/dL (ref 13.0–17.0)
LYMPHS PCT: 9 %
Lymphs Abs: 1.6 10*3/uL (ref 0.7–4.0)
MCH: 29.5 pg (ref 26.0–34.0)
MCHC: 32.1 g/dL (ref 30.0–36.0)
MCV: 92.1 fL (ref 78.0–100.0)
Monocytes Absolute: 2.2 10*3/uL — ABNORMAL HIGH (ref 0.1–1.0)
Monocytes Relative: 13 %
NEUTROS ABS: 13.1 10*3/uL — AB (ref 1.7–7.7)
Neutrophils Relative %: 77 %
PLATELETS: 648 10*3/uL — AB (ref 150–400)
RBC: 4.84 MIL/uL (ref 4.22–5.81)
RDW: 15.4 % (ref 11.5–15.5)
WBC: 17.1 10*3/uL — AB (ref 4.0–10.5)

## 2015-09-28 LAB — I-STAT CG4 LACTIC ACID, ED: LACTIC ACID, VENOUS: 1.81 mmol/L (ref 0.5–1.9)

## 2015-09-28 NOTE — ED Triage Notes (Signed)
Pt c/o left abdominal abscess for two weeks. Abscess is draining. Pt had abdominal surgery in December to remove part of his colon. Pt abscess is where one of his drainage tubes was placed. Pt took tylenol this morning. Pt denies n/v. Pt has right side colostomy

## 2015-09-29 ENCOUNTER — Encounter (HOSPITAL_COMMUNITY): Payer: Self-pay | Admitting: Radiology

## 2015-09-29 ENCOUNTER — Inpatient Hospital Stay (HOSPITAL_COMMUNITY)
Admission: EM | Admit: 2015-09-29 | Discharge: 2015-10-02 | DRG: 372 | Disposition: A | Discharge status: Home / Self Care | Payer: BLUE CROSS/BLUE SHIELD | Attending: Student in an Organized Health Care Education/Training Program | Admitting: Student in an Organized Health Care Education/Training Program

## 2015-09-29 ENCOUNTER — Other Ambulatory Visit (HOSPITAL_COMMUNITY): Payer: Self-pay | Admitting: Radiology

## 2015-09-29 ENCOUNTER — Emergency Department (HOSPITAL_COMMUNITY): Payer: BLUE CROSS/BLUE SHIELD

## 2015-09-29 DIAGNOSIS — I5022 Chronic systolic (congestive) heart failure: Secondary | ICD-10-CM | POA: Diagnosis present

## 2015-09-29 DIAGNOSIS — Z8619 Personal history of other infectious and parasitic diseases: Secondary | ICD-10-CM

## 2015-09-29 DIAGNOSIS — R509 Fever, unspecified: Secondary | ICD-10-CM | POA: Diagnosis present

## 2015-09-29 DIAGNOSIS — B9689 Other specified bacterial agents as the cause of diseases classified elsewhere: Secondary | ICD-10-CM | POA: Diagnosis not present

## 2015-09-29 DIAGNOSIS — K6819 Other retroperitoneal abscess: Secondary | ICD-10-CM | POA: Diagnosis present

## 2015-09-29 DIAGNOSIS — Z7901 Long term (current) use of anticoagulants: Secondary | ICD-10-CM

## 2015-09-29 DIAGNOSIS — Z933 Colostomy status: Secondary | ICD-10-CM | POA: Diagnosis not present

## 2015-09-29 DIAGNOSIS — Z87891 Personal history of nicotine dependence: Secondary | ICD-10-CM | POA: Diagnosis not present

## 2015-09-29 DIAGNOSIS — K632 Fistula of intestine: Secondary | ICD-10-CM | POA: Diagnosis present

## 2015-09-29 DIAGNOSIS — R1084 Generalized abdominal pain: Secondary | ICD-10-CM | POA: Diagnosis not present

## 2015-09-29 DIAGNOSIS — I482 Chronic atrial fibrillation, unspecified: Secondary | ICD-10-CM | POA: Diagnosis present

## 2015-09-29 DIAGNOSIS — Z9049 Acquired absence of other specified parts of digestive tract: Secondary | ICD-10-CM | POA: Diagnosis not present

## 2015-09-29 DIAGNOSIS — I4891 Unspecified atrial fibrillation: Secondary | ICD-10-CM | POA: Diagnosis present

## 2015-09-29 DIAGNOSIS — I428 Other cardiomyopathies: Secondary | ICD-10-CM | POA: Diagnosis not present

## 2015-09-29 DIAGNOSIS — R739 Hyperglycemia, unspecified: Secondary | ICD-10-CM | POA: Diagnosis present

## 2015-09-29 DIAGNOSIS — Z79899 Other long term (current) drug therapy: Secondary | ICD-10-CM | POA: Diagnosis not present

## 2015-09-29 DIAGNOSIS — K651 Peritoneal abscess: Secondary | ICD-10-CM | POA: Diagnosis not present

## 2015-09-29 DIAGNOSIS — IMO0002 Reserved for concepts with insufficient information to code with codable children: Secondary | ICD-10-CM

## 2015-09-29 DIAGNOSIS — Z6834 Body mass index (BMI) 34.0-34.9, adult: Secondary | ICD-10-CM | POA: Diagnosis not present

## 2015-09-29 DIAGNOSIS — F418 Other specified anxiety disorders: Secondary | ICD-10-CM | POA: Diagnosis present

## 2015-09-29 DIAGNOSIS — K6812 Psoas muscle abscess: Secondary | ICD-10-CM | POA: Diagnosis not present

## 2015-09-29 DIAGNOSIS — L0291 Cutaneous abscess, unspecified: Secondary | ICD-10-CM

## 2015-09-29 DIAGNOSIS — A419 Sepsis, unspecified organism: Secondary | ICD-10-CM

## 2015-09-29 DIAGNOSIS — L02211 Cutaneous abscess of abdominal wall: Secondary | ICD-10-CM | POA: Diagnosis not present

## 2015-09-29 LAB — C DIFFICILE QUICK SCREEN W PCR REFLEX
C DIFFICILE (CDIFF) TOXIN: NEGATIVE
C Diff antigen: POSITIVE — AB

## 2015-09-29 LAB — PROTIME-INR
INR: 2.08
Prothrombin Time: 23.7 seconds — ABNORMAL HIGH (ref 11.4–15.2)

## 2015-09-29 LAB — DIGOXIN LEVEL: Digoxin Level: <0.2 ng/mL — ABNORMAL LOW (ref 0.8–2.0)

## 2015-09-29 LAB — CLOSTRIDIUM DIFFICILE BY PCR: Toxigenic C. Difficile by PCR: POSITIVE — AB

## 2015-09-29 LAB — I-STAT CG4 LACTIC ACID, ED: Lactic Acid, Venous: 1.1 mmol/L (ref 0.5–1.9)

## 2015-09-29 MED ORDER — DIGOXIN 125 MCG PO TABS
0.1250 mg | ORAL_TABLET | Freq: Every day | ORAL | Status: DC
  Administered 2015-09-29 – 2015-10-02 (×4): 0.125 mg via ORAL
  Filled 2015-09-29 (×6): qty 1

## 2015-09-29 MED ORDER — DIPHENHYDRAMINE HCL 50 MG/ML IJ SOLN
25.0000 mg | Freq: Once | INTRAMUSCULAR | Status: AC
  Administered 2015-09-29: 25 mg via INTRAVENOUS
  Filled 2015-09-29: qty 1

## 2015-09-29 MED ORDER — HYDROMORPHONE HCL 1 MG/ML IJ SOLN
1.0000 mg | Freq: Once | INTRAMUSCULAR | Status: AC
  Administered 2015-09-29: 1 mg via INTRAVENOUS
  Filled 2015-09-29: qty 1

## 2015-09-29 MED ORDER — SODIUM CHLORIDE 0.9 % IV BOLUS (SEPSIS)
2000.0000 mL | Freq: Once | INTRAVENOUS | Status: AC
  Administered 2015-09-29: 2000 mL via INTRAVENOUS

## 2015-09-29 MED ORDER — HEPARIN SODIUM (PORCINE) 1000 UNIT/ML IJ SOLN
5000.0000 [IU] | Freq: Three times a day (TID) | INTRAMUSCULAR | Status: DC
  Filled 2015-09-29 (×2): qty 5

## 2015-09-29 MED ORDER — HEPARIN SODIUM (PORCINE) 5000 UNIT/ML IJ SOLN
5000.0000 [IU] | Freq: Three times a day (TID) | INTRAMUSCULAR | Status: DC
  Administered 2015-09-29: 5000 [IU] via SUBCUTANEOUS
  Filled 2015-09-29: qty 1

## 2015-09-29 MED ORDER — VANCOMYCIN 50 MG/ML ORAL SOLUTION
125.0000 mg | Freq: Four times a day (QID) | ORAL | Status: DC
  Administered 2015-09-29 – 2015-09-30 (×2): 125 mg via ORAL
  Filled 2015-09-29 (×4): qty 2.5

## 2015-09-29 MED ORDER — DILTIAZEM HCL ER COATED BEADS 180 MG PO CP24
180.0000 mg | ORAL_CAPSULE | Freq: Two times a day (BID) | ORAL | Status: DC
  Administered 2015-09-29 – 2015-10-02 (×7): 180 mg via ORAL
  Filled 2015-09-29 (×8): qty 1

## 2015-09-29 MED ORDER — PIPERACILLIN-TAZOBACTAM 3.375 G IVPB
3.3750 g | Freq: Three times a day (TID) | INTRAVENOUS | Status: DC
  Administered 2015-09-29 – 2015-10-02 (×10): 3.375 g via INTRAVENOUS
  Filled 2015-09-29 (×11): qty 50

## 2015-09-29 MED ORDER — DEXTROSE-NACL 5-0.45 % IV SOLN
INTRAVENOUS | Status: DC
  Administered 2015-09-29: 1000 mL via INTRAVENOUS

## 2015-09-29 MED ORDER — DIPHENHYDRAMINE HCL 50 MG/ML IJ SOLN
50.0000 mg | Freq: Once | INTRAMUSCULAR | Status: AC
  Administered 2015-09-29: 50 mg via INTRAVENOUS
  Filled 2015-09-29: qty 1

## 2015-09-29 MED ORDER — METHYLPREDNISOLONE SODIUM SUCC 125 MG IJ SOLR
80.0000 mg | Freq: Once | INTRAMUSCULAR | Status: AC
  Administered 2015-09-29: 80 mg via INTRAVENOUS
  Filled 2015-09-29: qty 2

## 2015-09-29 MED ORDER — OXYCODONE-ACETAMINOPHEN 5-325 MG PO TABS
1.0000 | ORAL_TABLET | ORAL | Status: DC | PRN
  Administered 2015-09-29 – 2015-10-01 (×6): 1 via ORAL
  Filled 2015-09-29 (×6): qty 1

## 2015-09-29 MED ORDER — PIPERACILLIN-TAZOBACTAM 3.375 G IVPB 30 MIN
3.3750 g | Freq: Once | INTRAVENOUS | Status: AC
  Administered 2015-09-29: 3.375 g via INTRAVENOUS
  Filled 2015-09-29: qty 50

## 2015-09-29 MED ORDER — IOPAMIDOL (ISOVUE-300) INJECTION 61%
INTRAVENOUS | Status: AC
  Administered 2015-09-29: 100 mL
  Filled 2015-09-29: qty 100

## 2015-09-29 NOTE — Progress Notes (Signed)
Dr. Leory Plowman notified of pt arrival to unit

## 2015-09-29 NOTE — H&P (Addendum)
Surgical Consultation Requesting provider: Varney Biles, MD  CC: Abdominal pain, fever, purulent drainage from LLQ drain site  HPI: This 50yo gentleman with a history of a-fib, cardiomyopathy, CHF on Xarelto is well known to [place] s/p open left hemicolecomy with end colostomy for perforated ischemic colitis on December 31, [id number], complicated by rectal stump leak with recurrent intraabdominal abscesses. He has been admitted multiple times in the last 9 months, most recently in July when he was treated with repeat IR drainage as well as treated for c diff. He has had a small bowel follow through (march) and a drain tract fistulagram (july), neither have demonstrated an enteric fistula to explain his recurrent abscesses. His stump does not appear to have been assessed radiographically or endoscopically. This evening he presents with 2 weeks of pain, purulent drainage from his LLQ drain site, and fevers. Also notes purulent drainage per rectum, though this has been ongoing and likely is just normal mucus drainage. His colostomy is functioning well, he empties the bag twice daily. He states he had been scheduled for colostomy reversal on 9/25.  Allergies  Allergen Reactions  . Contrast Media [Iodinated Diagnostic Agents] Rash    a diffuse macular rash after CTA chest  Pt was premedicated with 125mg  IV Solumedrol, 50mg  IV Benadryl 1 hr prior to CTexam, and tolerated procedure without any difficulties. 11/28/11 Also same pre med protocol observed on 02/22/15 and pt tolerated the procedure well    Past Medical History:  Diagnosis Date  . Allergy to IVP dye   . Atrial fibrillation ([place])    admx 11/13 with acute sCHF in setting of RVR  => a. failed DCCV x 2; b. Pradaxa started;  c. failed sotalol  . Cardiomyopathy ([place])    likely tachy mediated in setting of AF with RVR  . Chicken pox as a child  . Chronic anticoagulation    Pradaxa  . Chronic systolic heart failure ([place])    a. echo 11/13: Ef 40-45%,  diff HK, mod MR, mod LAE, mild RVE, mod RAE, small effusion;   b. TEE 11/13:  EF 35-40%, no LAA clot; Echo 2/14 shows normal EF  . Depression with anxiety 06/29/2011  . Migraine 06/29/2011  . Obesity 06/29/2011  . Snoring    patient needs sleep study - has declined    Past Surgical History:  Procedure Laterality Date  . CARDIOVERSION  11/30/2011   Procedure: CARDIOVERSION;  Surgeon: Thayer Headings, MD;  Location: [place];  Service: Cardiovascular;  Laterality: N/A;  . CARDIOVERSION  12/03/2011   Procedure: CARDIOVERSION;  Surgeon: Jolaine Artist, MD;  Location: [place];  Service: Cardiovascular;  Laterality: N/A;  . LAPAROTOMY N/A 01/17/2015   Procedure: EXPLORATORY LAPAROTOMY WITH LEFT COLECTOMY AND COLOSTOMY;  Surgeon: Rolm Bookbinder, MD;  Location: [place];  Service: General;  Laterality: N/A;  . TEE WITHOUT CARDIOVERSION  11/30/2011   Procedure: TRANSESOPHAGEAL ECHOCARDIOGRAM (TEE);  Surgeon: Thayer Headings, MD;  Location[facility] ENDOSCOPY;  Service: Cardiovascular;  Laterality: N/A;    Family History  Problem Relation Age of Onset  . Hypertension Mother   . Hypertension Father   . Aneurysm Father   . Alcohol abuse Father   . Cancer Maternal Grandmother     brain  . Diabetes Maternal Grandfather     type 2  . Parkinsonism Maternal Grandfather   . Cancer Paternal Grandmother     breast  . Diabetes Paternal Grandmother   . Alcohol abuse Paternal Grandfather  Social History   Social History  . Marital status: Divorced    Spouse name: N/A  . Number of children: N/A  . Years of education: N/A   Social History Main Topics  . Smoking status: Former Smoker    Packs/day: 1.00    Years: 20.00    Types: Cigarettes    Quit date: 07/10/2014  . Smokeless tobacco: Never Used  . Alcohol use No  . Drug use: No  . Sexual activity: No   Other Topics Concern  . None   Social History Narrative  . None    No current facility-administered medications on file prior to  encounter.    Current Outpatient Prescriptions on File Prior to Encounter  Medication Sig Dispense Refill  . digoxin (LANOXIN) 0.125 MG tablet Take 1 tablet (0.125 mg total) by mouth daily. 34 tablet 0  . diltiazem (CARDIZEM CD) 180 MG 24 hr capsule Take 1 capsule (180 mg total) by mouth 2 (two) times daily. 180 capsule 3  . furosemide (LASIX) 40 MG tablet Take 1 tablet (40 mg total) by mouth daily as needed for edema. 30 tablet 0  . lisinopril (PRINIVIL,ZESTRIL) 5 MG tablet Take 1 tablet (5 mg total) by mouth daily. 34 tablet 0  . metoprolol (LOPRESSOR) 100 MG tablet Take 1 tablet (100 mg total) by mouth 2 (two) times daily. 68 tablet 0  . Multiple Vitamin (MULTIVITAMIN WITH MINERALS) TABS tablet Take 1 tablet by mouth daily.    . rivaroxaban (XARELTO) 20 MG TABS tablet Take 1 tablet (20 mg total) by mouth daily with supper. 30 tablet 11    Review of Systems: a complete, 10pt review of systems was completed with pertinent positives and negatives as documented in the HPI.   Physical Exam: Vitals:   09/28/15 2041 09/28/15 2247  BP: 105/55 (!) 110/53  Pulse: 75 88  Resp: 20 18  Temp: 99.6 F (37.6 C) 100.1 F (37.8 C)   Gen: A&Ox3, no distress, lying comfortably on the stretcher H&N: normocephalic, atraumatic, EOMI. Neck supple without mass or thyromegaly Chest: unlabored respirations, irregular rhythm with palpable distal pulses Abdomen: obese, soft, nondistended. RUQ colostomy pink, patent and productive. Midline scar/large hernia. LLQ prior drain site with purulent drainage. Subcutaneous induration tracks about 2cm medially but no palpable subcutaneous fluctuance. 74mm border of erythema/skin irritation around the wound but there is no cellulitis present. Mild tenderness at this site, but abdomen is otherwise nontender. No rebound or guarding, no peritonitis.  Extremities: warm, without edema, no deformities Neuro: grossly intact Psych: very anxious mood and flat affect  CBC     Component Value Date/Time   WBC 17.1 (H) 09/28/2015 1940   RBC 4.84 09/28/2015 1940   HGB 14.3 09/28/2015 1940   HCT 44.6 09/28/2015 1940   PLT 648 (H) 09/28/2015 1940   MCV 92.1 09/28/2015 1940   MCH 29.5 09/28/2015 1940   MCHC 32.1 09/28/2015 1940   RDW 15.4 09/28/2015 1940   LYMPHSABS 1.6 09/28/2015 1940   MONOABS 2.2 (H) 09/28/2015 1940   EOSABS 0.2 09/28/2015 1940   BASOSABS 0.0 09/28/2015 1940    CMP     Component Value Date/Time   NA 134 (L) 09/28/2015 1940   K 4.7 09/28/2015 1940   CL 97 (L) 09/28/2015 1940   CO2 29 09/28/2015 1940   GLUCOSE 147 (H) 09/28/2015 1940   BUN 10 09/28/2015 1940   CREATININE 0.96 09/28/2015 1940   CALCIUM 9.1 09/28/2015 1940   PROT 7.6 09/28/2015 1940  ALBUMIN 2.7 (L) 09/28/2015 1940   AST 24 09/28/2015 1940   ALT 48 09/28/2015 1940   ALKPHOS 94 09/28/2015 1940   BILITOT 0.6 09/28/2015 1940   GFRNONAA >60 09/28/2015 1940   GFRAA >60 09/28/2015 1940  INR 2.08 Lactic acid 1.81 Blood and urine cx pending   Imaging: reviewed, prelim L psoas abscess   A/P: 50yo gentleman w/ history of recurrent intraabdominal abscess s/p emergent colectomy for perforated ischemic colitis, in the setting of afib/CHF. CT w L psoas abscess. Will require admission for IV abx, IR evaluation, and assessment of rectal stump.   Romana Juniper, MD [facility] Surgery, [place] Pager [phone]

## 2015-09-29 NOTE — H&P (Signed)
Date: 09/29/2015               Patient Name:  William Fischer MRN: [id number]  DOB: [birth year] Age / Sex: 50 y.o., male   PCP: Norman Herrlich, MD         Medical Service: Internal Medicine Teaching Service         Attending Physician: Dr. Varney Biles, MD    First Contact: Dr. Heber Gunter Pager: [phone]  Second Contact: Dr. Quay Burow Pager: [phone]       After Hours (After 5p/  First Contact Pager: [phone]  weekends / holidays): Second Contact Pager: [phone]   Chief Complaint: abdominal pain, fever, purulent discharge from LLQ pervious drain site  History of Present Illness:   50 yo male with hx of Afib on Xarelto, CHF EF 45-50%, obesity, s/p open hemicolectomy with end colostomy for perforated ischemic colitis on December [id number], complicated by rectal stump leak and recurrent abdominal abscess, who presents with abdominal pain, fever, and purulent draiange from LLQ previous drain site. He has been admitted multiple times in the last several months with abdominal abscess, last admission was on 07/2015 when he was treated with IR drainage of the abscess and also was treated for cdiff colitis. He received 14 days course of augmentin on discharge. Had Small bowel follow through and drain tract fistulogram in the past which did not show any enteric fistula to explain the recurrent abscess.   In ED he has WBC of 17.1, BP is on the lower side of 123XX123 systolic, and Tmax 123XX123. CT abdomen shows new intra-abd/retroperitoneal abscess extending from Left hemidiaphgram along left psoas muscle to level of the aortic bifurcation (at least 5.9x4.2x15.7 cm). Shows remaining gas containing fluid on previous sites of abscess. Surgery team evaluated patient and recommended discussion with IR for possible drain placement.   Patient is having LLQ/flank pain. No change in stool output from the bag. No other complaints currently. He is frustrated that he keeps getting these abscesses. He states he is tired of  wasting his money.  Meds:  Current Meds  Medication Sig  . digoxin (LANOXIN) 0.125 MG tablet Take 1 tablet (0.125 mg total) by mouth daily.  [name] diltiazem (CARDIZEM CD) 180 MG 24 hr capsule Take 1 capsule (180 mg total) by mouth 2 (two) times daily.  . furosemide (LASIX) 40 MG tablet Take 1 tablet (40 mg total) by mouth daily as needed for edema.  [name] lisinopril (PRINIVIL,ZESTRIL) 5 MG tablet Take 1 tablet (5 mg total) by mouth daily.  . metoprolol (LOPRESSOR) 100 MG tablet Take 1 tablet (100 mg total) by mouth 2 (two) times daily.  . Multiple Vitamin (MULTIVITAMIN WITH MINERALS) TABS tablet Take 1 tablet by mouth daily.  . rivaroxaban (XARELTO) 20 MG TABS tablet Take 1 tablet (20 mg total) by mouth daily with supper.     Allergies: Allergies as of 09/28/2015 - Review Complete 09/28/2015  Allergen Reaction Noted  . Contrast media [iodinated diagnostic agents] Rash 11/28/2011   Past Medical History:  Diagnosis Date  . Allergy to IVP dye   . Atrial fibrillation ([place])    admx 11/13 with acute sCHF in setting of RVR  => a. failed DCCV x 2; b. Pradaxa started;  c. failed sotalol  . Cardiomyopathy ([place])    likely tachy mediated in setting of AF with RVR  . Chicken pox as a child  . Chronic anticoagulation    Pradaxa  . Chronic systolic heart failure ([place])  a. echo 11/13: Ef 40-45%, diff HK, mod MR, mod LAE, mild RVE, mod RAE, small effusion;   b. TEE 11/13:  EF 35-40%, no LAA clot; Echo 2/14 shows normal EF  . Depression with anxiety 06/29/2011  . Migraine 06/29/2011  . Obesity 06/29/2011  . Snoring    patient needs sleep study - has declined    Family History: mother - HTN, father - HTN, MGF - DM II, parkinson, MGM brain cancer.   Social History: has 2 sons, former smoker who quit on 2016 after smoking 20 pack years, denies alcohol and drug use.   Review of Systems: Review of Systems  Constitutional: Positive for chills, fever and malaise/fatigue.  Eyes: Negative for blurred  vision and double vision.  Respiratory: Negative for cough, sputum production and shortness of breath.   Cardiovascular: Negative for chest pain, palpitations and leg swelling.  Gastrointestinal: Positive for abdominal pain. Negative for blood in stool, constipation, diarrhea, nausea and vomiting.  Genitourinary: Negative for dysuria and urgency.  Musculoskeletal: Negative for myalgias.  Skin: Negative for itching and rash.  Neurological: Positive for headaches. Negative for dizziness, sensory change and focal weakness.  Psychiatric/Behavioral: Negative for depression.     Physical Exam: Blood pressure 95/60, pulse 80, temperature 100.1 F (37.8 C), temperature source Oral, resp. rate 22, height 5\' 10"  (1.778 m), weight 241 lb (109.3 kg), SpO2 92 %. Physical Exam  Constitutional: He appears well-developed and well-nourished. No distress.  HENT:  Head: Normocephalic and atraumatic.  Eyes: Conjunctivae are normal. Right eye exhibits no discharge. Left eye exhibits no discharge. No scleral icterus.  Neck: Normal range of motion. No JVD present.  Cardiovascular: Exam reveals no gallop and no friction rub.   No murmur heard. Irregular rhythm.   Respiratory: Effort normal and breath sounds normal. No respiratory distress. He has no wheezes.  GI: Soft. Bowel sounds are normal.  RUQ colostomy pink, has loose bowel. No blood or puss noted.  Midline surgical scar. LLQ prior drain site with thick white/yellow purulent drainage. Mild tenderness to palpation noted at this area. No rebound or guarding, no peritonitis.   Skin: He is not diaphoretic.    EKG: none.  CXR: none  CT abd/pelvis: 1. New intra-abdominal/retroperitoneal abscess extending from just below the left hemidiaphragm, along the left psoas muscle, to the level of the aortic bifurcation and measuring at least 5.9 x 4.2 x 15.7 cm. 2. Unchanged size of gas containing fluid collection adjacent to the gastric body with interval  development of apparent sinus tract extending from this location inferiorly into the left lower quadrant. It is not clear whether this communicates with the bowel in the left lower quadrant. 3. Left lower quadrant percutaneous drainage access tract containing gas and fluid and extending to the inferior most aspect of the above-described suspected sinus tract. 4. Recurrent left lateral abdominal abscess measuring 1.7 x 4.2 centimeters. 5. New small left pleural effusion, likely reactive.  Assessment & Plan by Problem: Active Problems:   Depression with anxiety   Morbid obesity ([place])   Long term (current) use of anticoagulants [Z79.01]   Abdominal abscess (HCC)   Chronic atrial fibrillation (HCC)   NICM (nonischemic cardiomyopathy) ([place])  50 yo M with permanent Afib and recurrent abdominal abscess is her with new abdominal abscess.   Recurrent abdominal abscess - unclear etiology Likely a complication of previous abdominal surgery (hemicolectomy for ischemic colitis).  Presented with fever, abd pain, and also purulent drainage from previous abscess drain site. Surgery has  been consulted by EDP. They saw the patient and recommended IR consult for IR drain placement consideration like previous episodes. Zosyn was started by EDP. Wound cx Gram stain in ED shwoing GPC in pairs, GNR and rare GPR. Culture pending - f/up surgery and IR recs - cont zosyn for now until cultures result and narrow down appropriately. - hold xarelto for now. Last dose 9/11 at 5 pm. - pain control: percocet q4hr prn. - will re-test for cdiff as he is having purulent drainage from LLQ drain site.   NICM with EF 45-50% Hold metoprolol lisinopril, and lasix for now in the setting of low BPs. May restart later when BP goes up. - cont digoxin, diltiazem,  Chronic Afib (chads2vasc score of 2). on xarelto chronically.  Hold metoprolol. Hold xarelto. If BP comes up latera or becomes tachycardic, would consider  restarting - cont digoxin, diltiazem,  Dispo: Admit patient to Inpatient with expected length of stay greater than 2 midnights.  Signed: Dellia Nims, MD 09/29/2015, 7:52 AM  Pager: [phone]

## 2015-09-29 NOTE — Consult Note (Signed)
Chief Complaint: Patient was seen in consultation today for intra abdominal abscess drain Chief Complaint  Patient presents with  . Abscess   at the request of Dr Romana Juniper  Referring Physician(s): Dr Renaee Munda  Supervising Physician: Marybelle Killings  Patient Status: Inpatient  History of Present Illness: William Fischer is a 50 y.o. 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 New intra-abdominal/retroperitoneal abscess extending from just below the left hemidiaphragm, along the left psoas muscle, to the level of the aortic bifurcation and measuring at least 5.9 x 4.2 x 15.7 cm. 2. Unchanged size of gas containing fluid collection adjacent to the gastric body with interval development of apparent sinus tract extending from this location inferiorly into the left lower quadrant. It is not clear whether this communicates with the bowel in the left lower quadrant. 3. Left lower quadrant percutaneous drainage access tract containing gas and fluid and extending to the inferior most aspect of the above-described suspected sinus tract. 4. Recurrent left lateral abdominal abscess measuring 1.7 x 4.2 centimeters. 5. New small left pleural effusion, likely reactive  Request for abdominal abscess drain placement per Dr Kae Heller Dr Barbie Banner has reviewed imaging and approves procedure INR 2.08 today Xarelto held now---LD 9/11 5pm Will check INR in am  Scheduled for colostomy reversal 10/12/15   Past Medical History:  Diagnosis Date  . Allergy to IVP dye   . Atrial fibrillation ([place])    admx 11/13  with acute sCHF in setting of RVR  => a. failed DCCV x 2; b. Pradaxa started;  c. failed sotalol  . 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 Chicken pox as a child  . Chronic anticoagulation    Pradaxa  . Chronic systolic heart failure ([place])    a. echo 11/13: Ef 40-45%, diff HK, mod MR, mod LAE, mild RVE, mod RAE, small effusion;   b. TEE 11/13:  EF 35-40%, no LAA clot; Echo 2/14 shows normal EF  . Depression with anxiety 06/29/2011  . Migraine 06/29/2011  . Obesity 06/29/2011  . Snoring    patient needs sleep study - has declined    Past Surgical History:  Procedure Laterality Date  . CARDIOVERSION  11/30/2011   Procedure: CARDIOVERSION;  Surgeon: Thayer Headings, MD;  Location: [facility];  Service: Cardiovascular;  Laterality: N/A;  . CARDIOVERSION  12/03/2011   Procedure: CARDIOVERSION;  Surgeon: Jolaine Artist, MD;  Location: [facility];  Service: Cardiovascular;  Laterality: N/A;  . LAPAROTOMY N/A 01/17/2015   Procedure: EXPLORATORY LAPAROTOMY WITH LEFT COLECTOMY AND COLOSTOMY;  Surgeon: Rolm Bookbinder, MD;  Location: [facility];  Service: General;  Laterality: N/A;  . TEE WITHOUT CARDIOVERSION  11/30/2011   Procedure: TRANSESOPHAGEAL ECHOCARDIOGRAM (TEE);  Surgeon: Thayer Headings, MD;  Location: [facility] ENDOSCOPY;  Service: Cardiovascular;  Laterality: N/A;    Allergies: Contrast media [iodinated diagnostic agents]  Medications: Prior to Admission medications   Medication Sig Start Date End Date Taking? Authorizing Provider  digoxin (LANOXIN) 0.125 MG tablet Take 1 tablet (0.125 mg total) by mouth daily. 07/29/15  Yes [name], MD  diltiazem (CARDIZEM CD) 180 MG 24 hr capsule Take 1 capsule (180 mg total) by mouth 2 (two) times daily. 09/23/15  Yes Thompson Grayer, MD  furosemide (LASIX) 40 MG tablet Take 1 tablet (40 mg total) by mouth daily as needed for edema. 07/29/15  Yes [name], MD  lisinopril (PRINIVIL,ZESTRIL) 5 MG tablet Take 1 tablet  (5 mg total) by mouth daily. 07/29/15  Yes [name], MD  metoprolol (LOPRESSOR) 100 MG tablet Take 1 tablet (100 mg total) by mouth 2 (two) times daily. 07/29/15  Yes [name], MD  Multiple Vitamin (MULTIVITAMIN WITH MINERALS) TABS tablet Take 1 tablet by mouth daily.   Yes Historical Provider, MD  rivaroxaban (XARELTO) 20 MG TABS tablet Take 1 tablet (20 mg total) by mouth daily with supper. 09/23/15  Yes Thompson Grayer, MD     Family History  Problem Relation Age of Onset  . Hypertension Mother   . Hypertension Father   . Aneurysm Father   . Alcohol abuse Father   . Cancer Maternal Grandmother     brain  . Diabetes Maternal Grandfather     type 2  . Parkinsonism Maternal Grandfather   . Cancer Paternal Grandmother     breast  . Diabetes Paternal Grandmother   . Alcohol abuse Paternal Grandfather     Social History   Social History  . Marital status: Divorced    Spouse name: N/A  . Number of children: N/A  . Years of education: N/A   Social History Main Topics  . Smoking status: Former Smoker    Packs/day: 1.00    Years: 20.00    Types: Cigarettes    Quit date: 07/10/2014  . Smokeless tobacco: Never Used  . Alcohol use No  . Drug use: No  . Sexual activity: No   Other Topics Concern  . None   Social History Narrative  . None     Review of Systems: A 12 point ROS discussed and pertinent positives are indicated in the HPI above.  All other systems are negative.  Review of Systems  Constitutional: Positive for activity change. Negative for appetite change, fatigue, fever and unexpected weight change.  Gastrointestinal: Positive for abdominal pain and diarrhea.  Neurological: Positive for weakness.  Psychiatric/Behavioral: Negative for behavioral problems and confusion.    Vital Signs: BP 101/59   Pulse 89   Temp 97.7 F (36.5 C) (Oral)   Resp 19   Ht 5\' 10"  (1.778 m)   Wt 241 lb (109.3 kg)   SpO2 96%   BMI 34.58 kg/m   Physical Exam    Constitutional: He is oriented to person, place, and time.  Cardiovascular: Normal rate, regular rhythm and normal heart sounds.   Pulmonary/Chest: Effort normal and breath sounds normal.  Abdominal: Soft. There is tenderness.  Musculoskeletal: Normal range of motion.  Neurological: He is alert and oriented to person, place, and time.  Skin: Skin is warm and dry.  Purulent drainage from LLQ previous abscess drain site  Psychiatric: He has a normal mood and affect. His behavior is normal. Judgment and thought content normal.  Nursing note and vitals reviewed.   Mallampati Score:  MD Evaluation Airway: WNL Heart: WNL Abdomen: WNL Chest/ Lungs: WNL ASA  Classification: 3 Mallampati/Airway Score: One  Imaging: Ct Abdomen Pelvis W Contrast  Result Date: 09/29/2015 CLINICAL DATA:  Status post partial colectomy.  Abdominal abscess. EXAM: CT ABDOMEN AND PELVIS WITH CONTRAST TECHNIQUE: Multidetector CT imaging of  the abdomen and pelvis was performed using the standard protocol following bolus administration of intravenous contrast. CONTRAST:  100 mL ISOVUE-300 IOPAMIDOL (ISOVUE-300) INJECTION 61% COMPARISON:  CT abdomen pelvis 08/11/2015 FINDINGS: Lower chest: Small left pleural effusion and associated atelectasis. Hepatobiliary: Unchanged right hepatic lobe hypodense focus, likely cyst. No other focal hepatic lesions identified. No ascites. Cholelithiasis without evidence of acute cholecystitis. Pancreas: Normal pancreatic contours and enhancement. No peripancreatic fluid collection or pancreatic ductal dilatation. Spleen: Normal. Adrenals/Urinary Tract: Normal adrenal glands. No hydronephrosis or solid renal mass. Mild bilateral perinephric stranding. Retroperitoneum: There is a gas containing fluid collection adjacent to the left psoas muscle extending from the perisplenic area, posterior to the left kidney, to the level of the aortic bifurcation, measuring 5.9 cm transverse x 4.2 cm AP x 15.7  cm cc. Stomach/Bowel: There is a large ventral abdominal hernia containing multiple loops of nondilated small bowel. There is a right lower quadrant colostomy again seen. Suture line noted in the left lower quadrant. The the appendix is normal. Gas containing fluid collection abutting the gastric fundus is again seen. There is apparent tract extending inferiorly from this collection. There is a gas and fluid collection along the left lateral abdominal wall measuring approximately a 1.7 x 4.2 centimeters (series 7, image 46). Vascular/Lymphatic: Normal course and caliber of the major abdominal vessels. Multiple para-aortic lymph nodes measuring less than 1 centimeter. Reproductive: Normal prostate and seminal vesicles. Musculoskeletal: No lytic or blastic osseous lesion. Normal visualized extrathoracic and extraperitoneal soft tissues. Other: There is a track within the subcutaneous fat of the left lower quadrant at the site of prior percutaneous drain placement, which extends into the abdominal cavity near the most inferior aspect of the above-described suspected sinus tract. There is gas and fluid within the percutaneous access tract. IMPRESSION: 1. New intra-abdominal/retroperitoneal abscess extending from just below the left hemidiaphragm, along the left psoas muscle, to the level of the aortic bifurcation and measuring at least 5.9 x 4.2 x 15.7 cm. 2. Unchanged size of gas containing fluid collection adjacent to the gastric body with interval development of apparent sinus tract extending from this location inferiorly into the left lower quadrant. It is not clear whether this communicates with the bowel in the left lower quadrant. 3. Left lower quadrant percutaneous drainage access tract containing gas and fluid and extending to the inferior most aspect of the above-described suspected sinus tract. 4. Recurrent left lateral abdominal abscess measuring 1.7 x 4.2 centimeters. 5. New small left pleural effusion,  likely reactive. Electronically Signed   By: Ulyses Jarred M.D.   On: 09/29/2015 06:43    Labs:  CBC:  Recent Labs  07/27/15 0457 07/28/15 0539 07/29/15 0420 09/28/15 1940  WBC 19.0* 12.7* 8.9 17.1*  HGB 13.2 13.1 12.8* 14.3  HCT 40.9 40.3 39.2 44.6  PLT 393 382 365 648*    COAGS:  Recent Labs  04/13/15 1730  07/29/15 0420 08/10/15 1638 08/31/15 1631 09/29/15 0138  INR 2.62*  < > 1.33 1.7 1.8 2.08  APTT 49*  --   --   --   --   --   < > = values in this interval not displayed.  BMP:  Recent Labs  07/27/15 0457 07/28/15 0539 07/29/15 0420 09/28/15 1940  NA 136 138 140 134*  K 3.6 4.5 4.1 4.7  CL 101 101 102 97*  CO2 27 30 30 29   GLUCOSE 126* 139* 107* 147*  BUN 17 8 5* 10  CALCIUM 8.9 9.2 8.9  9.1  CREATININE 0.81 1.04 0.97 0.96  GFRNONAA >60 >60 >60 >60  GFRAA >60 >60 >60 >60    LIVER FUNCTION TESTS:  Recent Labs  04/15/15 0605 07/25/15 1441 07/26/15 0548 09/28/15 1940  BILITOT 0.4 0.5 0.7 0.6  AST 13* 18 13* 24  ALT 18 22 17  48  ALKPHOS 55 88 74 94  PROT 6.6 8.4* 7.0 7.6  ALBUMIN 2.3* 3.3* 2.6* 2.7*    TUMOR MARKERS: No results for input(s): AFPTM, CEA, CA199, CHROMGRNA in the last 8760 hours.  Assessment and Plan:  Ischemic colitis L hemicolectomy with end colostomy 01/17/2015 New intra abdominal abscess Scheduled for abscess drain placement 9/13 (will check INR and Hold Xarelto) Risks and Benefits discussed with the patient including bleeding, infection, damage to adjacent structures, bowel perforation/fistula connection, and sepsis. All of the patient's questions were answered, patient is agreeable to proceed. Consent signed and in chart.   Thank you for this interesting consult.  I greatly enjoyed meeting [place] and look forward to participating in their care.  A copy of this report was sent to the requesting provider on this date.  Electronically Signed: December Hedtke A 09/29/2015, 10:23 AM   I spent a total of 40  Minutes    in face to face in clinical consultation, greater than 50% of which was counseling/coordinating care for abd abscess drain

## 2015-09-29 NOTE — ED Notes (Signed)
Paged admitting MD. Patient requesting daily meds for Afib. None ordered at this time. Waiting for return phone call.

## 2015-09-29 NOTE — ED Provider Notes (Signed)
[facility] DEPT Provider Note   CSN: [id number] Arrival date & time: 09/28/15  1830  By signing my name below, I, Julien Nordmann, attest that this documentation has been prepared under the direction and in the presence of Varney Biles, MD.  Electronically Signed: Julien Nordmann, ED Scribe. 09/29/15. 12:54 AM.    History   Chief Complaint Chief Complaint  Patient presents with  . Abscess   The history is provided by the patient. No language interpreter was used.    HPI Comments: William Fischer is a 50 y.o. male who has a PMhx of a-fib, cardiomyopathy, chronic anticoagulation, CHF presents to the Emergency Department complaining of a gradual worsening, recurrent, moderate, LLQ abdominal abscess x 2 weeks ago. Associated LLQ abdominal pain, chills and fever (tmax 102.9). Pt has been having yellow drainage from the abscess. He notes having an abdominal surgery in December to have a partial removal of his colon. Pt reports having an abscess where a drainage tube was placed and reports having recurrent abscesses. Pt also reports having multiple admissions for same. He also has a right side colostomy bag. Pt has not informed his surgeon, Dr. Donne Hazel, of the current abscess drainage. He is currently on Xarelto. He denies hx of DM.  Past Medical History:  Diagnosis Date  . Allergy to IVP dye   . Atrial fibrillation ([place])    admx 11/13 with acute sCHF in setting of RVR  => a. failed DCCV x 2; b. Pradaxa started;  c. failed sotalol  . Cardiomyopathy ([place])    likely tachy mediated in setting of AF with RVR  . Chicken pox as a child  . Chronic anticoagulation    Pradaxa  . Chronic systolic heart failure ([place])    a. echo 11/13: Ef 40-45%, diff HK, mod MR, mod LAE, mild RVE, mod RAE, small effusion;   b. TEE 11/13:  EF 35-40%, no LAA clot; Echo 2/14 shows normal EF  . Depression with anxiety 06/29/2011  . Migraine 06/29/2011  . Obesity 06/29/2011  . Snoring    patient needs sleep study -  has declined    Patient Active Problem List   Diagnosis Date Noted  . NICM (nonischemic cardiomyopathy) ([place]) 08/03/2015  . Enteritis due to Clostridium difficile 07/30/2015  . Chronic atrial fibrillation ([place])   . Sepsis ([place]) 07/25/2015  . Anticoagulated on Coumadin 04/21/2015  . Prediabetes 04/16/2015  . Normocytic anemia 04/16/2015  . 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 Abdominal abscess ([place])   . Septic shock ([place]) 04/12/2015  . 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 Abscess of abdominal cavity ([place]) 02/22/2015  . Acute venous embolism and thrombosis of deep vessels of proximal lower extremity ([place]) [I82.4Y9] 02/11/2015  . Long term (current) use of anticoagulants [Z79.01] 02/11/2015  . Monitoring for long-term anticoagulant use 02/11/2015  . Intra-abdominal abscess ([place])   . Bowel perforation ([place])   . Pressure ulcer 01/26/2015  . Pleural effusion   . Perforated bowel ([place])   . Diverticulitis of colon with perforation 01/14/2015  . AKI (acute kidney injury) ([place])   . Chronic systolic heart failure ([place]) 12/08/2011  . Morbid obesity ([place]) 12/03/2011  . Depression with anxiety 06/29/2011  . Obesity 06/29/2011    Past Surgical History:  Procedure Laterality Date  . CARDIOVERSION  11/30/2011   Procedure: CARDIOVERSION;  Surgeon: Thayer Headings, MD;  Location: [facility];  Service: Cardiovascular;  Laterality: N/A;  . CARDIOVERSION  12/03/2011   Procedure: CARDIOVERSION;  Surgeon: Jolaine Artist, MD;  Location: [facility];  Service: Cardiovascular;  Laterality: N/A;  . LAPAROTOMY N/A 01/17/2015   Procedure: EXPLORATORY LAPAROTOMY WITH LEFT COLECTOMY AND COLOSTOMY;  Surgeon: Rolm Bookbinder, MD;  Location: [place];  Service: General;  Laterality: N/A;  . TEE WITHOUT CARDIOVERSION  11/30/2011   Procedure: TRANSESOPHAGEAL ECHOCARDIOGRAM (TEE);  Surgeon: Thayer Headings, MD;  Location: [facility];  Service: Cardiovascular;  Laterality: N/A;        Home Medications    Prior to Admission medications   Medication Sig Start Date End Date Taking? Authorizing Provider  digoxin (LANOXIN) 0.125 MG tablet Take 1 tablet (0.125 mg total) by mouth daily. 07/29/15  Yes [name], MD  diltiazem (CARDIZEM CD) 180 MG 24 hr capsule Take 1 capsule (180 mg total) by mouth 2 (two) times daily. 09/23/15  Yes Thompson Grayer, MD  furosemide (LASIX) 40 MG tablet Take 1 tablet (40 mg total) by mouth daily as needed for edema. 07/29/15  Yes [name], MD  lisinopril (PRINIVIL,ZESTRIL) 5 MG tablet Take 1 tablet (5 mg total) by mouth daily. 07/29/15  Yes [name], MD  metoprolol (LOPRESSOR) 100 MG tablet Take 1 tablet (100 mg total) by mouth 2 (two) times daily. 07/29/15  Yes [name], MD  Multiple Vitamin (MULTIVITAMIN WITH MINERALS) TABS tablet Take 1 tablet by mouth daily.   Yes Historical Provider, MD  rivaroxaban (XARELTO) 20 MG TABS tablet Take 1 tablet (20 mg total) by mouth daily with supper. 09/23/15  Yes Thompson Grayer, MD    Family History Family History  Problem Relation Age of Onset  . Hypertension Mother   . Hypertension Father   . Aneurysm Father   . Alcohol abuse Father   . Cancer Maternal Grandmother     brain  . Diabetes Maternal Grandfather     type 2  . Parkinsonism Maternal Grandfather   . Cancer Paternal Grandmother     breast  . Diabetes Paternal Grandmother   . Alcohol abuse Paternal Grandfather     Social History Social History  Substance Use Topics  . Smoking status: Former Smoker    Packs/day: 1.00    Years: 20.00    Types: Cigarettes    Quit date: 07/10/2014  . Smokeless tobacco: Never Used  . Alcohol use No     Allergies   Contrast media [iodinated diagnostic agents]   Review of Systems Review of Systems A complete 10 system review of systems was obtained and all systems are negative except as noted in the HPI and PMH.    Physical Exam Updated Vital Signs BP 115/67 (BP Location: Right Arm)    Pulse 74   Temp 97.6 F (36.4 C) (Oral)   Resp 16   Ht 5' 10.5" (1.791 m)   Wt 241 lb 2.9 oz (109.4 kg)   SpO2 98%   BMI 34.12 kg/m   Physical Exam  Constitutional: He is oriented to person, place, and time. He appears well-developed and well-nourished.  HENT:  Head: Normocephalic and atraumatic.  Eyes: EOM are normal.  Neck: Normal range of motion.  Cardiovascular: Normal rate, normal heart sounds and intact distal pulses.  An irregular rhythm present.  Pulmonary/Chest: Effort normal and breath sounds normal. No respiratory distress.  Abdominal: Soft. He exhibits no distension. There is tenderness.  RLQ pt has an ileostomy bag in place, no acute changes LLQ pt has a incisional wound that is draining purulent discharge There is slight erythema surrounding the area with focal  tenderness  Musculoskeletal: Normal range of motion.  Neurological: He is alert and oriented to person, place, and time.  Skin: Skin is warm and dry.  Psychiatric: He has a normal mood and affect. Judgment normal.  Nursing note and vitals reviewed.    ED Treatments / Results  DIAGNOSTIC STUDIES: Oxygen Saturation is 98% on RA, normal by my interpretation.  COORDINATION OF CARE:  12:54 AM Discussed treatment plan with pt at bedside and pt agreed to plan.  Labs (all labs ordered are listed, but only abnormal results are displayed) Labs Reviewed  C DIFFICILE QUICK SCREEN W PCR REFLEX - Abnormal; Notable for the following:       Result Value   C Diff antigen POSITIVE (*)    All other components within normal limits  CLOSTRIDIUM DIFFICILE BY PCR - Abnormal; Notable for the following:    Toxigenic C Difficile by pcr POSITIVE (*)    All other components within normal limits  COMPREHENSIVE METABOLIC PANEL - Abnormal; Notable for the following:    Sodium 134 (*)    Chloride 97 (*)    Glucose, Bld 147 (*)    Albumin 2.7 (*)    All other components within normal limits  CBC WITH DIFFERENTIAL/PLATELET -  Abnormal; Notable for the following:    WBC 17.1 (*)    Platelets 648 (*)    Neutro Abs 13.1 (*)    Monocytes Absolute 2.2 (*)    All other components within normal limits  URINALYSIS, ROUTINE W REFLEX MICROSCOPIC (NOT AT [facility]) - Abnormal; Notable for the following:    Color, Urine AMBER (*)    Protein, ur 30 (*)    All other components within normal limits  URINE MICROSCOPIC-ADD ON - Abnormal; Notable for the following:    Squamous Epithelial / LPF 0-5 (*)    Bacteria, UA RARE (*)    All other components within normal limits  PROTIME-INR - Abnormal; Notable for the following:    Prothrombin Time 23.7 (*)    All other components within normal limits  DIGOXIN LEVEL - Abnormal; Notable for the following:    Digoxin Level <0.2 (*)    All other components within normal limits  CBC WITH DIFFERENTIAL/PLATELET - Abnormal; Notable for the following:    WBC 18.9 (*)    Platelets 585 (*)    Neutro Abs 15.6 (*)    Monocytes Absolute 1.8 (*)    All other components within normal limits  BASIC METABOLIC PANEL - Abnormal; Notable for the following:    Sodium 134 (*)    Chloride 100 (*)    Glucose, Bld 261 (*)    All other components within normal limits  CULTURE, BLOOD (ROUTINE X 2)  CULTURE, BLOOD (ROUTINE X 2)  AEROBIC CULTURE (SUPERFICIAL SPECIMEN)  URINE CULTURE  PROTIME-INR  I-STAT CG4 LACTIC ACID, ED  I-STAT CG4 LACTIC ACID, ED    EKG  EKG Interpretation None       Radiology Ct Abdomen Pelvis W Contrast  Result Date: 09/29/2015 CLINICAL DATA:  Status post partial colectomy.  Abdominal abscess. EXAM: CT ABDOMEN AND PELVIS WITH CONTRAST TECHNIQUE: Multidetector CT imaging of the abdomen and pelvis was performed using the standard protocol following bolus administration of intravenous contrast. CONTRAST:  100 mL ISOVUE-300 IOPAMIDOL (ISOVUE-300) INJECTION 61% COMPARISON:  CT abdomen pelvis 08/11/2015 FINDINGS: Lower chest: Small left pleural effusion and associated  atelectasis. Hepatobiliary: Unchanged right hepatic lobe hypodense focus, likely cyst. No other focal hepatic lesions identified. No ascites. Cholelithiasis  without evidence of acute cholecystitis. Pancreas: Normal pancreatic contours and enhancement. No peripancreatic fluid collection or pancreatic ductal dilatation. Spleen: Normal. Adrenals/Urinary Tract: Normal adrenal glands. No hydronephrosis or solid renal mass. Mild bilateral perinephric stranding. Retroperitoneum: There is a gas containing fluid collection adjacent to the left psoas muscle extending from the perisplenic area, posterior to the left kidney, to the level of the aortic bifurcation, measuring 5.9 cm transverse x 4.2 cm AP x 15.7 cm cc. Stomach/Bowel: There is a large ventral abdominal hernia containing multiple loops of nondilated small bowel. There is a right lower quadrant colostomy again seen. Suture line noted in the left lower quadrant. The the appendix is normal. Gas containing fluid collection abutting the gastric fundus is again seen. There is apparent tract extending inferiorly from this collection. There is a gas and fluid collection along the left lateral abdominal wall measuring approximately a 1.7 x 4.2 centimeters (series 7, image 46). Vascular/Lymphatic: Normal course and caliber of the major abdominal vessels. Multiple para-aortic lymph nodes measuring less than 1 centimeter. Reproductive: Normal prostate and seminal vesicles. Musculoskeletal: No lytic or blastic osseous lesion. Normal visualized extrathoracic and extraperitoneal soft tissues. Other: There is a track within the subcutaneous fat of the left lower quadrant at the site of prior percutaneous drain placement, which extends into the abdominal cavity near the most inferior aspect of the above-described suspected sinus tract. There is gas and fluid within the percutaneous access tract. IMPRESSION: 1. New intra-abdominal/retroperitoneal abscess extending from just below  the left hemidiaphragm, along the left psoas muscle, to the level of the aortic bifurcation and measuring at least 5.9 x 4.2 x 15.7 cm. 2. Unchanged size of gas containing fluid collection adjacent to the gastric body with interval development of apparent sinus tract extending from this location inferiorly into the left lower quadrant. It is not clear whether this communicates with the bowel in the left lower quadrant. 3. Left lower quadrant percutaneous drainage access tract containing gas and fluid and extending to the inferior most aspect of the above-described suspected sinus tract. 4. Recurrent left lateral abdominal abscess measuring 1.7 x 4.2 centimeters. 5. New small left pleural effusion, likely reactive. Electronically Signed   By: Ulyses Jarred M.D.   On: 09/29/2015 06:43    Procedures Procedures (including critical care time)  Medications Ordered in ED Medications  piperacillin-tazobactam (ZOSYN) IVPB 3.375 g (3.375 g Intravenous New Bag/Given 09/30/15 0330)  digoxin (LANOXIN) tablet 0.125 mg (0.125 mg Oral Given 09/29/15 0959)  diltiazem (CARDIZEM CD) 24 hr capsule 180 mg (180 mg Oral Given 09/29/15 2213)  oxyCODONE-acetaminophen (PERCOCET/ROXICET) 5-325 MG per tablet 1 tablet (1 tablet Oral Given 09/29/15 1720)  heparin injection 5,000 Units (0 Units Subcutaneous Hold 09/30/15 0600)  vancomycin (VANCOCIN) 50 mg/mL oral solution 125 mg (125 mg Oral Given 09/30/15 0330)  piperacillin-tazobactam (ZOSYN) IVPB 3.375 g (0 g Intravenous Stopped 09/29/15 0205)  HYDROmorphone (DILAUDID) injection 1 mg (1 mg Intravenous Given 09/29/15 0137)  sodium chloride 0.9 % bolus 2,000 mL (0 mLs Intravenous Stopped 09/29/15 0346)  methylPREDNISolone sodium succinate (SOLU-MEDROL) 125 mg/2 mL injection 80 mg (80 mg Intravenous Given 09/29/15 0134)  diphenhydrAMINE (BENADRYL) injection 25 mg (25 mg Intravenous Given 09/29/15 0137)  HYDROmorphone (DILAUDID) injection 1 mg (1 mg Intravenous Given 09/29/15 0406)    diphenhydrAMINE (BENADRYL) injection 50 mg (50 mg Intravenous Given 09/29/15 0441)  iopamidol (ISOVUE-300) 61 % injection (100 mLs  Contrast Given 09/29/15 0612)  HYDROmorphone (DILAUDID) injection 1 mg (1 mg Intravenous Given 09/29/15 0807)  Initial Impression / Assessment and Plan / ED Course  I have reviewed the triage vital signs and the nursing notes.  Pertinent labs & imaging results that were available during my care of the patient were reviewed by me and considered in my medical decision making (see chart for details).  Clinical Course  Comment By Time  PT with recurrent abdominal abscess. Last drained on 7/10 with a percutaneous catheter by IR and 14 days of augmentin. He arrived to the ER with a fever and low BP. Pt had a BP rechecked, and it was NORMAL - so presumably the low BP is a FALSELY LOW BP. The falsely low BP truly appears to be the case since all the BP since then have been normal. Lactate < 2.I saw the patient at 12:55 am when he was placed in the room. I will get wound cultures. I have ordered zosyn and consulted Surgery. Source of infection is abdomen again. Varney Biles, MD 09/12 [phone]    I personally performed the services described in this documentation, which was scribed in my presence. The recorded information has been reviewed and is accurate.  Pt comes in with cc of fevers, sweats, abdominal wall swelling and drainage. Hx of recurrent intra-abd abscess, last time multiple sites involved and treated with percutaneous drainage and antibiotics. Repeat CT on 08/13/15 had showed resolution of the abscess.  Pt had elevated WC and fevers. Lactate is fine. There is a singular low BP documented when pt arrived. With the current information available to me, I don't think the patient is in septic shock. The MAP's <65/ SBP's <90 - the singular documented low BP that is, was isolated and  is related to reading is invalid or erroneous.      Final Clinical  Impressions(s) / ED Diagnoses   Final diagnoses:  Intra-abdominal abscess ([place])  Sepsis, due to unspecified organism [facility])  Abdominal abscess [facility])    New Prescriptions Current Discharge Medication List       Varney Biles, MD 09/30/15 [phone]

## 2015-09-29 NOTE — Progress Notes (Signed)
Pt admitted to room 6n7 from ED, ambulated to room from hallway- gait steady. Oriented to room and surroundings. Pt was rude to nurse and tech upon arrival to room.

## 2015-09-29 NOTE — Progress Notes (Signed)
spoked to Dr. Barbie Banner of IR. Planning to do IR drain placement for his abscess tomorrow morning. Have to wait 24 hours from his xarelto dosing to be on the safe side. If he becomes septic overnight, we will call them back for urgent procedure. Cont abx for now.

## 2015-09-30 ENCOUNTER — Inpatient Hospital Stay (HOSPITAL_COMMUNITY): Payer: BLUE CROSS/BLUE SHIELD

## 2015-09-30 DIAGNOSIS — Z221 Carrier of other intestinal infectious diseases: Secondary | ICD-10-CM

## 2015-09-30 DIAGNOSIS — Z79899 Other long term (current) drug therapy: Secondary | ICD-10-CM

## 2015-09-30 DIAGNOSIS — Z7901 Long term (current) use of anticoagulants: Secondary | ICD-10-CM

## 2015-09-30 LAB — URINE CULTURE: Culture: NO GROWTH

## 2015-09-30 LAB — CBC WITH DIFFERENTIAL/PLATELET
Basophils Absolute: 0 10*3/uL (ref 0.0–0.1)
Basophils Relative: 0 %
EOS ABS: 0 10*3/uL (ref 0.0–0.7)
EOS PCT: 0 %
HCT: 41.5 % (ref 39.0–52.0)
Hemoglobin: 13.3 g/dL (ref 13.0–17.0)
LYMPHS ABS: 1.5 10*3/uL (ref 0.7–4.0)
Lymphocytes Relative: 8 %
MCH: 29.4 pg (ref 26.0–34.0)
MCHC: 32 g/dL (ref 30.0–36.0)
MCV: 91.8 fL (ref 78.0–100.0)
MONOS PCT: 10 %
Monocytes Absolute: 1.8 10*3/uL — ABNORMAL HIGH (ref 0.1–1.0)
Neutro Abs: 15.6 10*3/uL — ABNORMAL HIGH (ref 1.7–7.7)
Neutrophils Relative %: 83 %
PLATELETS: 585 10*3/uL — AB (ref 150–400)
RBC: 4.52 MIL/uL (ref 4.22–5.81)
RDW: 15.1 % (ref 11.5–15.5)
WBC: 18.9 10*3/uL — ABNORMAL HIGH (ref 4.0–10.5)

## 2015-09-30 LAB — BASIC METABOLIC PANEL
Anion gap: 8 (ref 5–15)
BUN: 13 mg/dL (ref 6–20)
CO2: 26 mmol/L (ref 22–32)
CREATININE: 0.78 mg/dL (ref 0.61–1.24)
Calcium: 8.9 mg/dL (ref 8.9–10.3)
Chloride: 100 mmol/L — ABNORMAL LOW (ref 101–111)
GFR calc Af Amer: >60 mL/min (ref >60)
Glucose, Bld: 261 mg/dL — ABNORMAL HIGH (ref 65–99)
Potassium: 4.4 mmol/L (ref 3.5–5.1)
SODIUM: 134 mmol/L — AB (ref 135–145)

## 2015-09-30 LAB — PROTIME-INR
INR: 1.32
PROTHROMBIN TIME: 16.5 s — AB (ref 11.4–15.2)

## 2015-09-30 MED ORDER — LIDOCAINE HCL 1 % IJ SOLN
INTRAMUSCULAR | Status: AC
Start: 2015-09-30 — End: 2015-09-30
  Filled 2015-09-30: qty 20

## 2015-09-30 MED ORDER — HEPARIN SODIUM (PORCINE) 5000 UNIT/ML IJ SOLN
5000.0000 [IU] | Freq: Three times a day (TID) | INTRAMUSCULAR | Status: DC
  Administered 2015-10-01 – 2015-10-02 (×3): 5000 [IU] via SUBCUTANEOUS
  Filled 2015-09-30 (×2): qty 1

## 2015-09-30 MED ORDER — INSULIN ASPART 100 UNIT/ML ~~LOC~~ SOLN: 0.0000 [IU] | Freq: Every day | SUBCUTANEOUS | Status: DC

## 2015-09-30 MED ORDER — FENTANYL CITRATE (PF) 100 MCG/2ML IJ SOLN
INTRAMUSCULAR | Status: AC | PRN
  Administered 2015-09-30: 100 ug via INTRAVENOUS

## 2015-09-30 MED ORDER — METOPROLOL TARTRATE 100 MG PO TABS
100.0000 mg | ORAL_TABLET | Freq: Two times a day (BID) | ORAL | Status: DC
  Administered 2015-09-30 – 2015-10-02 (×5): 100 mg via ORAL
  Filled 2015-09-30 (×4): qty 1

## 2015-09-30 MED ORDER — FENTANYL CITRATE (PF) 100 MCG/2ML IJ SOLN
INTRAMUSCULAR | Status: AC
  Filled 2015-09-30: qty 2

## 2015-09-30 MED ORDER — HYDROMORPHONE HCL 1 MG/ML IJ SOLN
1.0000 mg | INTRAMUSCULAR | Status: DC | PRN
  Administered 2015-09-30 – 2015-10-01 (×4): 1 mg via INTRAVENOUS
  Filled 2015-09-30 (×4): qty 1

## 2015-09-30 MED ORDER — MIDAZOLAM HCL 2 MG/2ML IJ SOLN
INTRAMUSCULAR | Status: AC | PRN
  Administered 2015-09-30 (×2): 1 mg via INTRAVENOUS

## 2015-09-30 MED ORDER — INSULIN ASPART 100 UNIT/ML ~~LOC~~ SOLN: 0.0000 [IU] | Freq: Three times a day (TID) | SUBCUTANEOUS | Status: DC

## 2015-09-30 MED ORDER — MIDAZOLAM HCL 2 MG/2ML IJ SOLN
INTRAMUSCULAR | Status: AC
  Filled 2015-09-30: qty 2

## 2015-09-30 NOTE — Sedation Documentation (Signed)
Patient is resting comfortably. 

## 2015-09-30 NOTE — Progress Notes (Signed)
Subjective: Patient did well overnight. He is frustrated because the abscesses keep recurring. Feels hopeless and that he doesn't want to continue living like this. He is supposed to go with IR to get drain placed for abscess today. No chest pain, shortness of breath. Mild abdominal pain unchanged from yesterday.  Objective: Vital signs in last 24 hours: Vitals:   09/29/15 1315 09/29/15 1402 09/29/15 2030 09/30/15 0500  BP: 111/69 109/60 (!) 104/54 115/67  Pulse: 112 (!) 119 72 74  Resp: 19 19 18 16   Temp:  97.6 F (36.4 C) 97.9 F (36.6 C) 97.6 F (36.4 C)  TempSrc:  Oral Oral Oral  SpO2: 96% 98% 97% 98%  Weight:  241 lb 2.9 oz (109.4 kg)    Height:  5' 10.5" (1.791 m)     Weight change: 2.9 oz (0.083 kg)  Intake/Output Summary (Last 24 hours) at 09/30/15 1016 Last data filed at 09/30/15 0915  Gross per 24 hour  Intake              240 ml  Output             1550 ml  Net            -1310 ml   BP (!) 117/59 (BP Location: Right Arm)   Pulse 88   Temp 97.6 F (36.4 C) (Oral)   Resp (!) 22   Ht 5' 10.5" (1.791 m)   Wt 241 lb 2.9 oz (109.4 kg)   SpO2 96%   BMI 34.12 kg/m    General appearance: alert, cooperative, appears stated age, no distress and mildly obese Head: Normocephalic, without obvious abnormality, atraumatic Eyes: Anicteric sclerae Lungs: clear to auscultation bilaterally Heart: Irregular rhythm Abdomen: Soft. Bowel sounds are normal.  RUQ colostomy pink, has loose bowel. No blood or puss noted.  Midline surgical scar. LLQ prior drain site with thick white/yellow purulent drainage. Mild tenderness to palpation noted at this area. No rebound or guarding, no peritonitis.   Extremities: No edema. Warm and well-profused Pulses: 2+ and symmetric Skin: Skin color, texture, turgor normal. No rashes or lesions  Psych: Flat affect  Lab Results: Basic Metabolic Panel:  Recent Labs Lab 09/28/15 1940 09/30/15 0332  NA 134* 134*  K 4.7 4.4  CL 97* 100*  CO2  29 26  GLUCOSE 147* 261*  BUN 10 13  CREATININE 0.96 0.78  CALCIUM 9.1 8.9   Liver Function Tests:  Recent Labs Lab 09/28/15 1940  AST 24  ALT 48  ALKPHOS 94  BILITOT 0.6  PROT 7.6  ALBUMIN 2.7*   No results for input(s): LIPASE, AMYLASE in the last 168 hours. No results for input(s): AMMONIA in the last 168 hours. CBC:  Recent Labs Lab 09/28/15 1940 09/30/15 0332  WBC 17.1* 18.9*  NEUTROABS 13.1* 15.6*  HGB 14.3 13.3  HCT 44.6 41.5  MCV 92.1 91.8  PLT 648* 585*   Cardiac Enzymes: No results for input(s): CKTOTAL, CKMB, CKMBINDEX, TROPONINI in the last 168 hours. BNP: No results for input(s): PROBNP in the last 168 hours. D-Dimer: No results for input(s): DDIMER in the last 168 hours. CBG: No results for input(s): GLUCAP in the last 168 hours. Hemoglobin A1C: No results for input(s): HGBA1C in the last 168 hours. Fasting Lipid Panel: No results for input(s): CHOL, HDL, LDLCALC, TRIG, CHOLHDL, LDLDIRECT in the last 168 hours. Thyroid Function Tests: No results for input(s): TSH, T4TOTAL, FREET4, T3FREE, THYROIDAB in the last 168 hours. Coagulation:  Recent Labs  Lab 09/29/15 0138 09/30/15 0833  LABPROT 23.7* 16.5*  INR 2.08 1.32   Anemia Panel: No results for input(s): VITAMINB12, FOLATE, FERRITIN, TIBC, IRON, RETICCTPCT in the last 168 hours. Urine Drug Screen: Drugs of Abuse  No results found for: LABOPIA, COCAINSCRNUR, LABBENZ, AMPHETMU, THCU, LABBARB  Alcohol Level: No results for input(s): ETH in the last 168 hours. Urinalysis:  Recent Labs Lab 09/28/15 1921  COLORURINE AMBER*  LABSPEC 1.029  PHURINE 6.0  GLUCOSEU NEGATIVE  HGBUR NEGATIVE  BILIRUBINUR NEGATIVE  KETONESUR NEGATIVE  PROTEINUR 30*  NITRITE NEGATIVE  LEUKOCYTESUR NEGATIVE   Misc. Labs: C. Diff antigen positive, toxin negative, PCR positive  Micro Results: Recent Results (from the past 240 hour(s))  Urine culture     Status: None   Collection Time: 09/28/15  7:21 PM    Result Value Ref Range Status   Specimen Description URINE, RANDOM  Final   Special Requests NONE  Final   Culture NO GROWTH  Final   Report Status 09/30/2015 FINAL  Final  Culture, blood (Routine x 2)     Status: None (Preliminary result)   Collection Time: 09/28/15  7:29 PM  Result Value Ref Range Status   Specimen Description BLOOD RIGHT ARM  Final   Special Requests BOTTLES DRAWN AEROBIC AND ANAEROBIC 5CC  Final   Culture NO GROWTH < 24 HOURS  Final   Report Status PENDING  Incomplete  Culture, blood (Routine x 2)     Status: None (Preliminary result)   Collection Time: 09/28/15  7:35 PM  Result Value Ref Range Status   Specimen Description BLOOD LEFT ARM  Final   Special Requests IN PEDIATRIC BOTTLE 4CC  Final   Culture NO GROWTH < 24 HOURS  Final   Report Status PENDING  Incomplete  Wound or Superficial Culture     Status: None (Preliminary result)   Collection Time: 09/29/15  1:39 AM  Result Value Ref Range Status   Specimen Description WOUND  Final   Special Requests ABDOMEN  Final   Gram Stain   Final    MODERATE WBC PRESENT,BOTH PMN AND MONONUCLEAR FEW GRAM POSITIVE COCCI IN PAIRS FEW GRAM NEGATIVE RODS RARE GRAM POSITIVE RODS    Culture PENDING  Incomplete   Report Status PENDING  Incomplete  C difficile quick scan w PCR reflex     Status: Abnormal   Collection Time: 09/29/15  9:10 AM  Result Value Ref Range Status   C Diff antigen POSITIVE (A) NEGATIVE Final   C Diff toxin NEGATIVE NEGATIVE Final   C Diff interpretation Results are indeterminate. See PCR results.  Final  Clostridium Difficile by PCR     Status: Abnormal   Collection Time: 09/29/15  9:10 AM  Result Value Ref Range Status   Toxigenic C Difficile by pcr POSITIVE (A) NEGATIVE Final    Comment: Positive for toxigenic C. difficile with little to no toxin production. Only treat if clinical presentation suggests symptomatic illness. CRITICAL RESULT CALLED TO, READ BACK BY AND VERIFIED WITH: P. MOSS,  RN AT [id number] ON 09/29/15 BY C. JESSUP, MLT.    Studies/Results: Ct Abdomen Pelvis W Contrast  Result Date: 09/29/2015 CLINICAL DATA:  Status post partial colectomy.  Abdominal abscess. EXAM: CT ABDOMEN AND PELVIS WITH CONTRAST TECHNIQUE: Multidetector CT imaging of the abdomen and pelvis was performed using the standard protocol following bolus administration of intravenous contrast. CONTRAST:  100 mL ISOVUE-300 IOPAMIDOL (ISOVUE-300) INJECTION 61% COMPARISON:  CT abdomen pelvis 08/11/2015 FINDINGS: Lower chest: Small  left pleural effusion and associated atelectasis. Hepatobiliary: Unchanged right hepatic lobe hypodense focus, likely cyst. No other focal hepatic lesions identified. No ascites. Cholelithiasis without evidence of acute cholecystitis. Pancreas: Normal pancreatic contours and enhancement. No peripancreatic fluid collection or pancreatic ductal dilatation. Spleen: Normal. Adrenals/Urinary Tract: Normal adrenal glands. No hydronephrosis or solid renal mass. Mild bilateral perinephric stranding. Retroperitoneum: There is a gas containing fluid collection adjacent to the left psoas muscle extending from the perisplenic area, posterior to the left kidney, to the level of the aortic bifurcation, measuring 5.9 cm transverse x 4.2 cm AP x 15.7 cm cc. Stomach/Bowel: There is a large ventral abdominal hernia containing multiple loops of nondilated small bowel. There is a right lower quadrant colostomy again seen. Suture line noted in the left lower quadrant. The the appendix is normal. Gas containing fluid collection abutting the gastric fundus is again seen. There is apparent tract extending inferiorly from this collection. There is a gas and fluid collection along the left lateral abdominal wall measuring approximately a 1.7 x 4.2 centimeters (series 7, image 46). Vascular/Lymphatic: Normal course and caliber of the major abdominal vessels. Multiple para-aortic lymph nodes measuring less than 1 centimeter.  Reproductive: Normal prostate and seminal vesicles. Musculoskeletal: No lytic or blastic osseous lesion. Normal visualized extrathoracic and extraperitoneal soft tissues. Other: There is a track within the subcutaneous fat of the left lower quadrant at the site of prior percutaneous drain placement, which extends into the abdominal cavity near the most inferior aspect of the above-described suspected sinus tract. There is gas and fluid within the percutaneous access tract. IMPRESSION: 1. New intra-abdominal/retroperitoneal abscess extending from just below the left hemidiaphragm, along the left psoas muscle, to the level of the aortic bifurcation and measuring at least 5.9 x 4.2 x 15.7 cm. 2. Unchanged size of gas containing fluid collection adjacent to the gastric body with interval development of apparent sinus tract extending from this location inferiorly into the left lower quadrant. It is not clear whether this communicates with the bowel in the left lower quadrant. 3. Left lower quadrant percutaneous drainage access tract containing gas and fluid and extending to the inferior most aspect of the above-described suspected sinus tract. 4. Recurrent left lateral abdominal abscess measuring 1.7 x 4.2 centimeters. 5. New small left pleural effusion, likely reactive. Electronically Signed   By: Ulyses Jarred M.D.   On: 09/29/2015 06:43   Medications: I have reviewed the patient's current medications. Scheduled Meds: . digoxin  0.125 mg Oral Daily  . diltiazem  180 mg Oral BID  . heparin subcutaneous  5,000 Units Subcutaneous Q8H  . piperacillin-tazobactam (ZOSYN)  IV  3.375 g Intravenous Q8H   Continuous Infusions:  PRN Meds:.oxyCODONE-acetaminophen Assessment/Plan: Active Problems:   Depression with anxiety   Morbid obesity (HCC)   Long term (current) use of anticoagulants [Z79.01]   Abdominal abscess (HCC)   Chronic atrial fibrillation (HCC)   NICM (nonischemic cardiomyopathy) ([place])  50 yo male  with recurrent abdominal abscesses. HDS.   1.) Recurrent abdominal abscesses: Likely d/t complication from previous surgery (hemicolectomy for perforated ischemic colitis vs. Diverticulitis). IR and surgery have been consulted. Zosyn started in the ED. Wound Gram stain showed: GPC in pairs, GNR and rare GPR.   - IR for drain placement today   - Continue Zosyn    - Follow-up wound culture   - Continue prn oxycodone 5-325 mg q4h for pain   - Re-consult Surgery to ascertain definitive management  2.) NICM with EF 45-50%:  Hold lisinopril, and lasix in the setting of soft pressures.   - Continue Digoxin 0.125 QD   - Continue Diltiazem 180 mg BID   - Resume Metoprolol 100 BID  3.) Positive C. Diff: Patient noted no change in ostomy bag output. Pain is unchanged. Likely colonized with C. Diff, but concern for C. Diff colitis is low   - D/C PO Vancomycin  4.) Chronic Atrial Fibrillation: ChadsVASC score of 2, on Xarelto    - Hold Xarelto for IR procedure   - Continue Metoprolol 100 mg BID for rate control  DVT/VTE Ppx: Heparin TID FEN: NPO. Resume CM diet after. Code: Full  Dispo: Inpatient   This is a Careers information officer Note.  The care of the patient was discussed with Dr. Quay Burow and the assessment and plan formulated with their assistance.  Please see their attached note for official documentation of the daily encounter.   LOS: 1 day   Jethro Bolus, Medical Student 09/30/2015, 10:16 AM

## 2015-09-30 NOTE — Procedures (Signed)
Interventional Radiology Procedure Note  Procedure:  CT guided drainage of left retroperitoneal abscess and draining abdominal wall fistula   Complications:  None   Estimated Blood Loss: 10 mL  Left retroperitoneal abscess sampled with 18 G needle yielding purulent fluid.  12 Fr drain placed and attached to suction bulb.  Will flush this drain q 8 hrs.  Anterior LLQ draining fistula accessed and guidewire advanced.  8 Fr drain placed over wire and extends to abdominal wall by CT.  Attached to suction bulb.  Will not flush this drain currently.  Venetia Night. Kathlene Cote, M.D Pager:  [phone]

## 2015-09-30 NOTE — Progress Notes (Signed)
Patient refused blood glucose check, MD made aware.

## 2015-09-30 NOTE — Progress Notes (Signed)
Inpatient Diabetes Program Recommendations  AACE/ADA: New Consensus Statement on Inpatient Glycemic Control (2015)  Target Ranges:  Prepandial:   less than 140 mg/dL      Peak postprandial:   less than 180 mg/dL (1-2 hours)      Critically ill patients:  140 - 180 mg/dL   Lab Results  Component Value Date   GLUCAP 125 (H) 07/28/2015   HGBA1C 6.0 (H) 07/26/2015    Review of Glycemic Control  Diabetes history: None Current orders for Inpatient glycemic control: None  Inpatient Diabetes Program Recommendations:   Last A1c 6.0% on 07/26/15 Patient's lab glucose this am was 261 mg/dl at 0332 am. Please consider ordering CBGs and possibly Novolog Sensitive TID.  Thanks,  Tama Headings RN, MSN, [facility] Inpatient Diabetes Coordinator Team Pager [phone] (8a-5p)

## 2015-09-30 NOTE — Progress Notes (Signed)
Subjective: Patient was seen and examined this morning. He is understandably frustrated with his situation. He continues to feel unwell with left lower abdominal pain and swelling and purulent discharge from previous drain site in LLQ. He is beginning to feel hopeless with the situation and doesn't want to live like this.  Objective: Vital signs in last 24 hours: Vitals:   09/29/15 1402 09/29/15 2030 09/30/15 0500 09/30/15 1017  BP: 109/60 (!) 104/54 115/67 (!) 117/59  Pulse: (!) 119 72 74 88  Resp: 19 18 16  (!) 22  Temp: 97.6 F (36.4 C) 97.9 F (36.6 C) 97.6 F (36.4 C)   TempSrc: Oral Oral Oral   SpO2: 98% 97% 98% 96%  Weight: 241 lb 2.9 oz (109.4 kg)     Height: 5' 10.5" (1.791 m)      Physical Exam General: Vital signs reviewed.  Patient is obese, in no acute distress and cooperative with exam.  Cardiovascular: RRR Pulmonary/Chest: Clear to auscultation bilaterally on anterior auscultation. Abdominal: Soft, non-tender, BS +, no guarding present. LLQ incision site with purulent drainage. RLQ ostomy site with stool output. Midline surgical incision site well healed. Extremities: No lower extremity edema bilaterally Psychiatric: Flat, depressed mood and affect.   Assessment/Plan: Active Problems:   Depression with anxiety   Morbid obesity ([place])   Long term (current) use of anticoagulants [Z79.01]   Abdominal abscess (HCC)   Chronic atrial fibrillation (HCC)   NICM (nonischemic cardiomyopathy) ([place])  50 yo M with permanent Afib and recurrent abdominal abscesses presenting with abdominal pain and found to have a recurrent abdominal abscess.   Recurrent Abdominal Abscess: Patient originally underwent a left hemicolectomy with end colostomy for perforated ischemic colitis on 0000000 complicated by rectal stump leak with recurrent intraabdominal abscesses. Previous studies including a small bowel follow through and drain tract fistulogram have not shown a fistula to explain  recurrent abscesses. He states his stump has also been evaluated. However, given these recurrent abscesses, patient likely has a leak from some part of his surgical site. Wound Cx Gram stain in ED showing GPC in pairs, GNR and rare GPR. Culture pending. CT scan showed a new intra-abdominal/retroperitoneal abscess extending from just below the left hemidiaphragm, along the left psoas muscle, to the level of the aortic bifurcation and measuring at least 5.9 x 4.2 x 15.7 cm. Patient was seen by general surgery who recommended IV antibiotics and IR drain placement and assessment of rectal stump.   -IR drain placement today -Surgery following, appreciate recommendations -IR following, appreciate recommendations -Continue Zosyn -Follow up Cx -Holding Xarelto -Continue oxycodone 5-325 mg Q4H prn   NICM with EF 45-50%: Patient is on digoxin, diltiazem, metoprolol, lasix and lisinopril at home. Metoprolol, lisinopril, and lasix were initially held in the setting of soft BPs. BP this morning is 117/59. -Continue Digoxin 0.125 mg once daily -Diltiazem 180 mg BID -Restart Metoprolol 100 mg BID -Holding lisinopril and lasix  Chronic Atrial Fibrillation: CHADS-Vasc 2 on Xarelto chronically.  -Continue metoprolol  100 mg BID -Continue Digoxin 0.125 mg QD -Continue Diltiazem 180 mg BID -Holding Xarelto for procedure  Purulent Drainage: 2/2 abscess. C. Diff was checked due to this, and showed C. Diff antigen positive, Toxin negative, PCR positive. Patient denies increased or watery bowel movements into ostomy bag. Likely colonization. -D/C Vancomycin  DVT/PE ppx: Heparin TID CODE: FULL FEN: NPO for procedure, resume CM diet afterwards  Dispo: Anticipated discharge in approximately 3 day(s).   LOS: 1 day   Alexa Burns,  DO PGY-3 Internal Medicine Resident Pager # [phone] 09/30/2015 10:36 AM

## 2015-10-01 DIAGNOSIS — K6812 Psoas muscle abscess: Secondary | ICD-10-CM

## 2015-10-01 LAB — COMPREHENSIVE METABOLIC PANEL
ALT: 37 U/L (ref 17–63)
ANION GAP: 8 (ref 5–15)
AST: 17 U/L (ref 15–41)
Albumin: 2.5 g/dL — ABNORMAL LOW (ref 3.5–5.0)
Alkaline Phosphatase: 68 U/L (ref 38–126)
BUN: 15 mg/dL (ref 6–20)
CHLORIDE: 103 mmol/L (ref 101–111)
CO2: 28 mmol/L (ref 22–32)
Calcium: 8.8 mg/dL — ABNORMAL LOW (ref 8.9–10.3)
Creatinine, Ser: 0.89 mg/dL (ref 0.61–1.24)
GFR calc non Af Amer: >60 mL/min (ref >60)
Glucose, Bld: 135 mg/dL — ABNORMAL HIGH (ref 65–99)
Potassium: 4.2 mmol/L (ref 3.5–5.1)
SODIUM: 139 mmol/L (ref 135–145)
Total Bilirubin: 0.6 mg/dL (ref 0.3–1.2)
Total Protein: 6.2 g/dL — ABNORMAL LOW (ref 6.5–8.1)

## 2015-10-01 LAB — CBC
HCT: 42.6 % (ref 39.0–52.0)
Hemoglobin: 13.6 g/dL (ref 13.0–17.0)
MCH: 29.3 pg (ref 26.0–34.0)
MCHC: 31.9 g/dL (ref 30.0–36.0)
MCV: 91.8 fL (ref 78.0–100.0)
PLATELETS: 549 10*3/uL — AB (ref 150–400)
RBC: 4.64 MIL/uL (ref 4.22–5.81)
RDW: 15.5 % (ref 11.5–15.5)
WBC: 11.5 10*3/uL — ABNORMAL HIGH (ref 4.0–10.5)

## 2015-10-01 MED ORDER — MORPHINE SULFATE (PF) 4 MG/ML IV SOLN
4.0000 mg | INTRAVENOUS | Status: DC | PRN
  Administered 2015-10-01 – 2015-10-02 (×7): 4 mg via INTRAVENOUS
  Filled 2015-10-01 (×7): qty 1

## 2015-10-01 MED ORDER — VANCOMYCIN HCL IN DEXTROSE 1-5 GM/200ML-% IV SOLN
1000.0000 mg | Freq: Three times a day (TID) | INTRAVENOUS | Status: DC
  Filled 2015-10-01: qty 200

## 2015-10-01 MED ORDER — OXYCODONE-ACETAMINOPHEN 5-325 MG PO TABS
2.0000 | ORAL_TABLET | Freq: Four times a day (QID) | ORAL | Status: DC | PRN
  Administered 2015-10-02: 2 via ORAL
  Filled 2015-10-01: qty 2

## 2015-10-01 MED ORDER — VANCOMYCIN HCL 10 G IV SOLR
1500.0000 mg | Freq: Once | INTRAVENOUS | Status: DC
  Filled 2015-10-01: qty 1500

## 2015-10-01 NOTE — Progress Notes (Signed)
   Subjective: Patient was seen and examined this morning. He complains of 8/10 pain in his left upper and lower quadrant of his abdomen. He also admits to chills and diaphoresis. He denies nausea or vomiting, he is eating well.   Objective: Vital signs in last 24 hours: Vitals:   09/30/15 1127 09/30/15 1300 09/30/15 2155 10/01/15 0546  BP: 111/68 112/72 108/67 95/60  Pulse: 92 85 80 67  Resp: 20 20 20 18   Temp:  97.6 F (36.4 C) 97.5 F (36.4 C) 97.7 F (36.5 C)  TempSrc:  Oral Oral Oral  SpO2: 95% 95% 97% 99%  Weight:      Height:       Physical Exam General: Vital signs reviewed.  Patient is obese, in no acute distress and cooperative with exam.  Cardiovascular: RRR Pulmonary/Chest: Clear to auscultation bilaterally on anterior auscultation. Abdominal: Soft, non-tender, BS +, no guarding present. RLQ ostomy site with stool output. Midline surgical incision site well healed. Left flank drain with purulent drainage tinged with blood. LLQ drain with purulent drainage.  Extremities: No lower extremity edema bilaterally  Assessment/Plan: Active Problems:   Depression with anxiety   Morbid obesity (HCC)   Long term (current) use of anticoagulants [Z79.01]   Abdominal abscess (HCC)   Chronic atrial fibrillation (HCC)   NICM (nonischemic cardiomyopathy) ([place])  50 yo M with permanent Afib and recurrent abdominal abscesses presenting with abdominal pain and found to have a recurrent abdominal abscess.   Recurrent Abdominal Abscess: Patient originally underwent a left hemicolectomy with end colostomy for perforated ischemic colitis on 0000000 complicated by rectal stump leak with recurrent intraabdominal abscesses. CT scan showed a new intra-abdominal/retroperitoneal abscess extending from just below the left hemidiaphragm, along the left psoas muscle, to the level of the aortic bifurcation and measuring at least 5.9 x 4.2 x 15.7 cm. Patient went for IR placement of two drains  yesterday- one in the left sided retroperitoneal abscess and one in the left lower quadrant at the prior site of drain placement. IR plans to repeat CT scan once output is minimal. Cultures growing gram positive cocci and gram negative rods. May need staph coverage, but given clinical improvement, will hold off on broadening antibiotics until cultures are back.  -Surgery following, appreciate recommendations -IR following, appreciate recommendations -Continue Zosyn -Follow up Cx -Holding Xarelto -Continue oxycodone 5-325 mg 2 tabs Q6H prn and morphine 4 mg Q3H prn  -IR plans to repeat CT scan once output is minimal. -Change antibiotics based on culture results as patient is stable and improving clinically (in July patient was discharged on Augmentin for a 14 day course, in March 2017 patient discharged on Ceftriaxone 2 grams and Flagyl 500 mg TID for 4 weeks, in February 2017 patient was discharged on Ciprofloxacin BID for an unknown duration, and December 2016 discharged with cefdinir 300 mg BID).   NICM with EF 45-50%: Patient is on digoxin, diltiazem, metoprolol, lasix and lisinopril at home.  -Continue Digoxin 0.125 mg once daily -Diltiazem 180 mg BID -Metoprolol 100 mg BID -Holding lisinopril and lasix  Chronic Atrial Fibrillation: CHADS-Vasc 2 on Xarelto chronically. Rate controlled. -Continue metoprolol  100 mg BID -Continue Digoxin 0.125 mg QD -Continue Diltiazem 180 mg BID -Holding Xarelto for procedures  DVT/PE ppx: Heparin TID CODE: FULL FEN: CM   Dispo: Anticipated discharge in approximately 3 day(s).   LOS: 2 days   Martyn Malay, DO PGY-3 Internal Medicine Resident Pager # [phone] 10/01/2015 10:41 AM

## 2015-10-01 NOTE — Progress Notes (Signed)
Subjective: Patient did well overnight. He is frustrated because the abscesses keep recurring. Feels hopeless and that he doesn't want to continue living like this. Underwent drain placement for L RP abscess and L superficial abscess. Pain is not well-controlled. No chest pain, shortness of breath. Mild abdominal pain unchanged from yesterday. Patient has been able to ambulate. Tolerating diet well.  Objective: Vital signs in last 24 hours: Vitals:   09/30/15 1127 09/30/15 1300 09/30/15 2155 10/01/15 0546  BP: 111/68 112/72 108/67 95/60  Pulse: 92 85 80 67  Resp: 20 20 20 18   Temp:  97.6 F (36.4 C) 97.5 F (36.4 C) 97.7 F (36.5 C)  TempSrc:  Oral Oral Oral  SpO2: 95% 95% 97% 99%  Weight:      Height:       Weight change:   Intake/Output Summary (Last 24 hours) at 10/01/15 0843 Last data filed at 10/01/15 0549  Gross per 24 hour  Intake             1040 ml  Output              790 ml  Net              250 ml   BP 95/60 (BP Location: Right Arm)   Pulse 67   Temp 97.7 F (36.5 C) (Oral)   Resp 18   Ht 5' 10.5" (1.791 m)   Wt 241 lb 2.9 oz (109.4 kg)   SpO2 99%   BMI 34.12 kg/m    General appearance: alert, cooperative, appears stated age, no distress and mildly obese Head: Normocephalic, without obvious abnormality, atraumatic Eyes: Anicteric sclerae Lungs: clear to auscultation bilaterally Heart: Irregular rhythm Abdomen: Soft. Bowel sounds are normal.  RUQ colostomy pink, has loose bowel. No blood or puss noted.  Midline surgical scar. LLQ drain with seropurulent discharge. L retroperitoneal drain with seropurulent discharge. Mild tenderness to palpation noted at this area. No rebound or guarding, no peritonitis.   Extremities: No edema. Warm and well-profused Pulses: 2+ and symmetric Skin: Skin color, texture, turgor normal. No rashes or lesions  Psych: Flat affect  Lab Results: Basic Metabolic Panel:  Recent Labs Lab 09/30/15 0332 10/01/15 0712  NA 134*  139  K 4.4 4.2  CL 100* 103  CO2 26 28  GLUCOSE 261* 135*  BUN 13 15  CREATININE 0.78 0.89  CALCIUM 8.9 8.8*   Liver Function Tests:  Recent Labs Lab 09/28/15 1940 10/01/15 0712  AST 24 17  ALT 48 37  ALKPHOS 94 68  BILITOT 0.6 0.6  PROT 7.6 6.2*  ALBUMIN 2.7* 2.5*   No results for input(s): LIPASE, AMYLASE in the last 168 hours. No results for input(s): AMMONIA in the last 168 hours. CBC:  Recent Labs Lab 09/28/15 1940 09/30/15 0332 10/01/15 0712  WBC 17.1* 18.9* 11.5*  NEUTROABS 13.1* 15.6*  --   HGB 14.3 13.3 13.6  HCT 44.6 41.5 42.6  MCV 92.1 91.8 91.8  PLT 648* 585* 549*   Cardiac Enzymes: No results for input(s): CKTOTAL, CKMB, CKMBINDEX, TROPONINI in the last 168 hours. BNP: No results for input(s): PROBNP in the last 168 hours. D-Dimer: No results for input(s): DDIMER in the last 168 hours. CBG: No results for input(s): GLUCAP in the last 168 hours. Hemoglobin A1C: No results for input(s): HGBA1C in the last 168 hours. Fasting Lipid Panel: No results for input(s): CHOL, HDL, LDLCALC, TRIG, CHOLHDL, LDLDIRECT in the last 168 hours. Thyroid Function Tests: No  results for input(s): TSH, T4TOTAL, FREET4, T3FREE, THYROIDAB in the last 168 hours. Coagulation:  Recent Labs Lab 09/29/15 0138 09/30/15 0833  LABPROT 23.7* 16.5*  INR 2.08 1.32   Anemia Panel: No results for input(s): VITAMINB12, FOLATE, FERRITIN, TIBC, IRON, RETICCTPCT in the last 168 hours. Urine Drug Screen: Drugs of Abuse  No results found for: LABOPIA, COCAINSCRNUR, LABBENZ, AMPHETMU, THCU, LABBARB  Alcohol Level: No results for input(s): ETH in the last 168 hours. Urinalysis:  Recent Labs Lab 09/28/15 1921  COLORURINE AMBER*  LABSPEC 1.029  PHURINE 6.0  GLUCOSEU NEGATIVE  HGBUR NEGATIVE  BILIRUBINUR NEGATIVE  KETONESUR NEGATIVE  PROTEINUR 30*  NITRITE NEGATIVE  LEUKOCYTESUR NEGATIVE   Misc. Labs: C. Diff antigen positive, toxin negative, PCR positive  Micro  Results: Recent Results (from the past 240 hour(s))  Urine culture     Status: None   Collection Time: 09/28/15  7:21 PM  Result Value Ref Range Status   Specimen Description URINE, RANDOM  Final   Special Requests NONE  Final   Culture NO GROWTH  Final   Report Status 09/30/2015 FINAL  Final  Culture, blood (Routine x 2)     Status: None (Preliminary result)   Collection Time: 09/28/15  7:29 PM  Result Value Ref Range Status   Specimen Description BLOOD RIGHT ARM  Final   Special Requests BOTTLES DRAWN AEROBIC AND ANAEROBIC 5CC  Final   Culture NO GROWTH 2 DAYS  Final   Report Status PENDING  Incomplete  Culture, blood (Routine x 2)     Status: None (Preliminary result)   Collection Time: 09/28/15  7:35 PM  Result Value Ref Range Status   Specimen Description BLOOD LEFT ARM  Final   Special Requests IN PEDIATRIC BOTTLE 4CC  Final   Culture NO GROWTH 2 DAYS  Final   Report Status PENDING  Incomplete  Wound or Superficial Culture     Status: None (Preliminary result)   Collection Time: 09/29/15  1:39 AM  Result Value Ref Range Status   Specimen Description WOUND  Final   Special Requests ABDOMEN  Final   Gram Stain   Final    MODERATE WBC PRESENT,BOTH PMN AND MONONUCLEAR FEW GRAM POSITIVE COCCI IN PAIRS FEW GRAM NEGATIVE RODS RARE GRAM POSITIVE RODS    Culture CULTURE REINCUBATED FOR BETTER GROWTH  Final   Report Status PENDING  Incomplete  C difficile quick scan w PCR reflex     Status: Abnormal   Collection Time: 09/29/15  9:10 AM  Result Value Ref Range Status   C Diff antigen POSITIVE (A) NEGATIVE Final   C Diff toxin NEGATIVE NEGATIVE Final   C Diff interpretation Results are indeterminate. See PCR results.  Final  Clostridium Difficile by PCR     Status: Abnormal   Collection Time: 09/29/15  9:10 AM  Result Value Ref Range Status   Toxigenic C Difficile by pcr POSITIVE (A) NEGATIVE Final    Comment: Positive for toxigenic C. difficile with little to no toxin  production. Only treat if clinical presentation suggests symptomatic illness. CRITICAL RESULT CALLED TO, READ BACK BY AND VERIFIED WITH: P. MOSS, RN AT [id number] ON 09/29/15 BY C. JESSUP, MLT.   Aerobic/Anaerobic Culture (surgical/deep wound)     Status: None (Preliminary result)   Collection Time: 09/30/15 11:36 AM  Result Value Ref Range Status   Specimen Description ASPIRATE LEFT ABSCESS  Final   Special Requests   Final    ASPIRATE FROM LEFT FLANK AND RETROPERITONEAL  ABSCESS   Gram Stain   Final    NO WBC SEEN ABUNDANT GRAM POSITIVE COCCI IN PAIRS IN CLUSTERS MODERATE GRAM NEGATIVE RODS FEW GRAM VARIABLE ROD    Culture PENDING  Incomplete   Report Status PENDING  Incomplete  Aerobic Culture (superficial specimen)     Status: None (Preliminary result)   Collection Time: 09/30/15 12:05 PM  Result Value Ref Range Status   Specimen Description ABSCESS ABDOMEN LEFT  Final   Special Requests NONE  Final   Gram Stain   Final    RARE WBC PRESENT, PREDOMINANTLY PMN FEW GRAM POSITIVE COCCI IN PAIRS IN CLUSTERS RARE GRAM VARIABLE ROD    Culture PENDING  Incomplete   Report Status PENDING  Incomplete   Studies/Results: Ct Image Guided Drainage By Percutaneous Catheter  Result Date: 09/30/2015 CLINICAL DATA:  History of ischemic colitis with prior colectomy, colostomy and recurrent abdominal abscesses as well as persistent recent purulent drainage from a left lower quadrant abdominal wall fistula at the site of prior drainage catheter placement. EXAM: 1. CT GUIDED CATHETER DRAINAGE OF LEFT RETROPERITONEAL ABSCESS 2. CT-GUIDED CATHETER DRAINAGE OF LEFT ABDOMINAL WALL FISTULA ANESTHESIA/SEDATION: 1.0 mg IV Versed 50 mcg IV Fentanyl Total Moderate Sedation Time:  20 minutes The patient's level of consciousness and physiologic status were continuously monitored during the procedure by Radiology nursing. PROCEDURE: The procedure, risks, benefits, and alternatives were explained to the patient.  Questions regarding the procedure were encouraged and answered. The patient understands and consents to the procedure. A time out was performed prior to initiating the procedure. The abdominal wall was prepped with chlorhexidine in a sterile fashion, and a sterile drape was applied covering the operative field. A sterile gown and sterile gloves were used for the procedure. Local anesthesia was provided with 1% Lidocaine. From a supine position, the left side was rolled up slightly. Initial unenhanced CT was performed. Under CT guidance, an 18 gauge trocar needle was advanced to the level of the left retroperitoneal abscess. Fluid was aspirated and a sample sent for culture analysis. A guidewire was advanced through the needle. The percutaneous tract was dilated and a 12 French percutaneous drainage catheter placed. Catheter position was confirmed by CT. The catheter was flushed and connected to a suction bulb. A 5 French dilator was advanced into the draining fistula of the left lower quadrant abdominal wall at the site of previous catheter insertion. A guidewire was advanced through the dilator. Over the wire, an 8 [place] drain was placed. Drain position was confirmed by CT. The drain was connected to a suction bulb. Both drains were secured at the skin with Prolene retention sutures and adhesive StatLock devices. COMPLICATIONS: None FINDINGS: There was immediate purulent fluid return from the large left-sided retroperitoneal abscess located posterior to the kidney and along the lateral margin of the psoas muscle. Given purulent drainage from the left lower quadrant old drain exit site, decision was made to try to place a catheter in the fistulous tract. The was ability to advance an 8 [place] drain over a guidewire and CT shows the drain terminating just at the level of the abdominal wall. The left retroperitoneal abscess drainage catheter will be flushed. The left lower quadrant fistula tract drain will not be  initially flushed in order to try to get the fistula to close. IMPRESSION: 1. CT-guided drainage of left-sided retroperitoneal abscess with placement of 12 French drainage catheter. This catheter was connected to suction bulb drainage. A sample of purulent fluid was sent  for culture analysis. 2. CT-guided placement of 8 French drainage catheter into left lower quadrant abdominal wall fistula at the site of prior drainage catheter placement. This strain extends to the abdominal wall. This drain was attached to suction bulb drainage. Electronically Signed   By: Aletta Edouard M.D.   On: 09/30/2015 13:49   Ct Image Guided Drainage Percut Cath  Peritoneal Retroperit  Result Date: 09/30/2015 CLINICAL DATA:  History of ischemic colitis with prior colectomy, colostomy and recurrent abdominal abscesses as well as persistent recent purulent drainage from a left lower quadrant abdominal wall fistula at the site of prior drainage catheter placement. EXAM: 1. CT GUIDED CATHETER DRAINAGE OF LEFT RETROPERITONEAL ABSCESS 2. CT-GUIDED CATHETER DRAINAGE OF LEFT ABDOMINAL WALL FISTULA ANESTHESIA/SEDATION: 1.0 mg IV Versed 50 mcg IV Fentanyl Total Moderate Sedation Time:  20 minutes The patient's level of consciousness and physiologic status were continuously monitored during the procedure by Radiology nursing. PROCEDURE: The procedure, risks, benefits, and alternatives were explained to the patient. Questions regarding the procedure were encouraged and answered. The patient understands and consents to the procedure. A time out was performed prior to initiating the procedure. The abdominal wall was prepped with chlorhexidine in a sterile fashion, and a sterile drape was applied covering the operative field. A sterile gown and sterile gloves were used for the procedure. Local anesthesia was provided with 1% Lidocaine. From a supine position, the left side was rolled up slightly. Initial unenhanced CT was performed. Under CT  guidance, an 18 gauge trocar needle was advanced to the level of the left retroperitoneal abscess. Fluid was aspirated and a sample sent for culture analysis. A guidewire was advanced through the needle. The percutaneous tract was dilated and a 12 French percutaneous drainage catheter placed. Catheter position was confirmed by CT. The catheter was flushed and connected to a suction bulb. A 5 French dilator was advanced into the draining fistula of the left lower quadrant abdominal wall at the site of previous catheter insertion. A guidewire was advanced through the dilator. Over the wire, an 8 [place] drain was placed. Drain position was confirmed by CT. The drain was connected to a suction bulb. Both drains were secured at the skin with Prolene retention sutures and adhesive StatLock devices. COMPLICATIONS: None FINDINGS: There was immediate purulent fluid return from the large left-sided retroperitoneal abscess located posterior to the kidney and along the lateral margin of the psoas muscle. Given purulent drainage from the left lower quadrant old drain exit site, decision was made to try to place a catheter in the fistulous tract. The was ability to advance an 8 [place] drain over a guidewire and CT shows the drain terminating just at the level of the abdominal wall. The left retroperitoneal abscess drainage catheter will be flushed. The left lower quadrant fistula tract drain will not be initially flushed in order to try to get the fistula to close. IMPRESSION: 1. CT-guided drainage of left-sided retroperitoneal abscess with placement of 12 French drainage catheter. This catheter was connected to suction bulb drainage. A sample of purulent fluid was sent for culture analysis. 2. CT-guided placement of 8 French drainage catheter into left lower quadrant abdominal wall fistula at the site of prior drainage catheter placement. This strain extends to the abdominal wall. This drain was attached to suction bulb  drainage. Electronically Signed   By: Aletta Edouard M.D.   On: 09/30/2015 13:49   Medications: I have reviewed the patient's current medications. Scheduled Meds: . digoxin  0.125  mg Oral Daily  . diltiazem  180 mg Oral BID  . heparin subcutaneous  5,000 Units Subcutaneous Q8H  . metoprolol  100 mg Oral BID  . piperacillin-tazobactam (ZOSYN)  IV  3.375 g Intravenous Q8H   Continuous Infusions:  PRN Meds:.morphine injection, oxyCODONE-acetaminophen Assessment/Plan: Active Problems:   Depression with anxiety   Morbid obesity (HCC)   Long term (current) use of anticoagulants [Z79.01]   Abdominal abscess (HCC)   Chronic atrial fibrillation (HCC)   NICM (nonischemic cardiomyopathy) ([facility])  50 yo male with recurrent abdominal abscesses. HDS.   1.) Recurrent abdominal abscesses: Likely d/t complication from previous surgery (hemicolectomy for perforated ischemic colitis vs. Diverticulitis). IR has placed a drain in the previous site and also in the retroperitoneal abscess site. Surgery is following. Zosyn started in the ED. Left retroperitoneal aspirate Gram stain showed: abundant GPC in pairs in clusters, moderate GNRs, and few Gram variable rods. Superficial abscess Gram stain showed: Few GPC in clusters and rare GNRs. Some concern for Staph aureus co-infection. Will hold additional antibiotics for now, as patient is HDS and leukocytosis is trending down.   - Monitor drain output   - Continue Zosyn   - Consider adding Vanc if patient deteriorates   - Follow-up wound culture/aspirate   - Continue prn oxycodone 5-325 mg 2 tabs q6h for pain. Added 4 mg morphine q3h prn   - Re-consult Surgery to ascertain definitive management  2.) NICM with EF 45-50%: Hold lisinopril, and lasix in the setting of soft pressures.   - Continue Digoxin 0.125 QD   - Continue Diltiazem 180 mg BID   - Resume Metoprolol 100 BID  3.) Positive C. Diff Antigen/PCR: Patient noted no change in ostomy bag output. Pain  is unchanged. Likely colonized with C. Diff, but concern for C. Diff colitis is low   - D/C PO Vancomycin  4.) Chronic Atrial Fibrillation: ChadsVASC score of 2, on Xarelto    - Resume Xarelto   - Continue Metoprolol 100 mg BID for rate control  DVT/VTE Ppx: Heparin TID FEN: CM diet Code: Full  Dispo: Inpatient   This is a Careers information officer Note.  The care of the patient was discussed with Dr. Quay Burow and the assessment and plan formulated with their assistance.  Please see their attached note for official documentation of the daily encounter.   LOS: 2 days   Jethro Bolus, Medical Student 10/01/2015, 8:43 AM

## 2015-10-01 NOTE — Progress Notes (Signed)
Referring Physician(s): Connor,C  Supervising Physician: Sandi Mariscal  Patient Status:  Inpatient  Chief Complaint: Abdominal abscesses   Subjective:  Pt doing ok; feels sl better today; has had few sweats overnight; no N/V; some soreness at drain sites  Allergies: Contrast media [iodinated diagnostic agents]  Medications: Prior to Admission medications   Medication Sig Start Date End Date Taking? Authorizing Provider  digoxin (LANOXIN) 0.125 MG tablet Take 1 tablet (0.125 mg total) by mouth daily. 07/29/15  Yes [name], MD  diltiazem (CARDIZEM CD) 180 MG 24 hr capsule Take 1 capsule (180 mg total) by mouth 2 (two) times daily. 09/23/15  Yes Thompson Grayer, MD  furosemide (LASIX) 40 MG tablet Take 1 tablet (40 mg total) by mouth daily as needed for edema. 07/29/15  Yes [name], MD  lisinopril (PRINIVIL,ZESTRIL) 5 MG tablet Take 1 tablet (5 mg total) by mouth daily. 07/29/15  Yes [name], MD  metoprolol (LOPRESSOR) 100 MG tablet Take 1 tablet (100 mg total) by mouth 2 (two) times daily. 07/29/15  Yes [name], MD  Multiple Vitamin (MULTIVITAMIN WITH MINERALS) TABS tablet Take 1 tablet by mouth daily.   Yes Historical Provider, MD  rivaroxaban (XARELTO) 20 MG TABS tablet Take 1 tablet (20 mg total) by mouth daily with supper. 09/23/15  Yes Thompson Grayer, MD     Vital Signs: BP 95/60 (BP Location: Right Arm)   Pulse 67   Temp 97.7 F (36.5 C) (Oral)   Resp 18   Ht 5' 10.5" (1.791 m)   Wt 241 lb 2.9 oz (109.4 kg)   SpO2 99%   BMI 34.12 kg/m   Physical Exam left RP/LLQ drains intact, outputs 200/15 cc respectively bloody/beige colored fluid; cx's pend  Imaging: Ct Abdomen Pelvis W Contrast  Result Date: 09/29/2015 CLINICAL DATA:  Status post partial colectomy.  Abdominal abscess. EXAM: CT ABDOMEN AND PELVIS WITH CONTRAST TECHNIQUE: Multidetector CT imaging of the abdomen and pelvis was performed using the standard protocol following bolus administration of  intravenous contrast. CONTRAST:  100 mL ISOVUE-300 IOPAMIDOL (ISOVUE-300) INJECTION 61% COMPARISON:  CT abdomen pelvis 08/11/2015 FINDINGS: Lower chest: Small left pleural effusion and associated atelectasis. Hepatobiliary: Unchanged right hepatic lobe hypodense focus, likely cyst. No other focal hepatic lesions identified. No ascites. Cholelithiasis without evidence of acute cholecystitis. Pancreas: Normal pancreatic contours and enhancement. No peripancreatic fluid collection or pancreatic ductal dilatation. Spleen: Normal. Adrenals/Urinary Tract: Normal adrenal glands. No hydronephrosis or solid renal mass. Mild bilateral perinephric stranding. Retroperitoneum: There is a gas containing fluid collection adjacent to the left psoas muscle extending from the perisplenic area, posterior to the left kidney, to the level of the aortic bifurcation, measuring 5.9 cm transverse x 4.2 cm AP x 15.7 cm cc. Stomach/Bowel: There is a large ventral abdominal hernia containing multiple loops of nondilated small bowel. There is a right lower quadrant colostomy again seen. Suture line noted in the left lower quadrant. The the appendix is normal. Gas containing fluid collection abutting the gastric fundus is again seen. There is apparent tract extending inferiorly from this collection. There is a gas and fluid collection along the left lateral abdominal wall measuring approximately a 1.7 x 4.2 centimeters (series 7, image 46). Vascular/Lymphatic: Normal course and caliber of the major abdominal vessels. Multiple para-aortic lymph nodes measuring less than 1 centimeter. Reproductive: Normal prostate and seminal vesicles. Musculoskeletal: No lytic or blastic osseous lesion. Normal visualized extrathoracic and extraperitoneal soft tissues. Other: There is a track within the subcutaneous fat  of the left lower quadrant at the site of prior percutaneous drain placement, which extends into the abdominal cavity near the most inferior  aspect of the above-described suspected sinus tract. There is gas and fluid within the percutaneous access tract. IMPRESSION: 1. New intra-abdominal/retroperitoneal abscess extending from just below the left hemidiaphragm, along the left psoas muscle, to the level of the aortic bifurcation and measuring at least 5.9 x 4.2 x 15.7 cm. 2. Unchanged size of gas containing fluid collection adjacent to the gastric body with interval development of apparent sinus tract extending from this location inferiorly into the left lower quadrant. It is not clear whether this communicates with the bowel in the left lower quadrant. 3. Left lower quadrant percutaneous drainage access tract containing gas and fluid and extending to the inferior most aspect of the above-described suspected sinus tract. 4. Recurrent left lateral abdominal abscess measuring 1.7 x 4.2 centimeters. 5. New small left pleural effusion, likely reactive. Electronically Signed   By: Ulyses Jarred M.D.   On: 09/29/2015 06:43   Ct Image Guided Drainage By Percutaneous Catheter  Result Date: 09/30/2015 CLINICAL DATA:  History of ischemic colitis with prior colectomy, colostomy and recurrent abdominal abscesses as well as persistent recent purulent drainage from a left lower quadrant abdominal wall fistula at the site of prior drainage catheter placement. EXAM: 1. CT GUIDED CATHETER DRAINAGE OF LEFT RETROPERITONEAL ABSCESS 2. CT-GUIDED CATHETER DRAINAGE OF LEFT ABDOMINAL WALL FISTULA ANESTHESIA/SEDATION: 1.0 mg IV Versed 50 mcg IV Fentanyl Total Moderate Sedation Time:  20 minutes The patient's level of consciousness and physiologic status were continuously monitored during the procedure by Radiology nursing. PROCEDURE: The procedure, risks, benefits, and alternatives were explained to the patient. Questions regarding the procedure were encouraged and answered. The patient understands and consents to the procedure. A time out was performed prior to initiating  the procedure. The abdominal wall was prepped with chlorhexidine in a sterile fashion, and a sterile drape was applied covering the operative field. A sterile gown and sterile gloves were used for the procedure. Local anesthesia was provided with 1% Lidocaine. From a supine position, the left side was rolled up slightly. Initial unenhanced CT was performed. Under CT guidance, an 18 gauge trocar needle was advanced to the level of the left retroperitoneal abscess. Fluid was aspirated and a sample sent for culture analysis. A guidewire was advanced through the needle. The percutaneous tract was dilated and a 12 French percutaneous drainage catheter placed. Catheter position was confirmed by CT. The catheter was flushed and connected to a suction bulb. A 5 French dilator was advanced into the draining fistula of the left lower quadrant abdominal wall at the site of previous catheter insertion. A guidewire was advanced through the dilator. Over the wire, an 8 [place] drain was placed. Drain position was confirmed by CT. The drain was connected to a suction bulb. Both drains were secured at the skin with Prolene retention sutures and adhesive StatLock devices. COMPLICATIONS: None FINDINGS: There was immediate purulent fluid return from the large left-sided retroperitoneal abscess located posterior to the kidney and along the lateral margin of the psoas muscle. Given purulent drainage from the left lower quadrant old drain exit site, decision was made to try to place a catheter in the fistulous tract. The was ability to advance an 8 [place] drain over a guidewire and CT shows the drain terminating just at the level of the abdominal wall. The left retroperitoneal abscess drainage catheter will be flushed. The left lower  quadrant fistula tract drain will not be initially flushed in order to try to get the fistula to close. IMPRESSION: 1. CT-guided drainage of left-sided retroperitoneal abscess with placement of 12 French  drainage catheter. This catheter was connected to suction bulb drainage. A sample of purulent fluid was sent for culture analysis. 2. CT-guided placement of 8 French drainage catheter into left lower quadrant abdominal wall fistula at the site of prior drainage catheter placement. This strain extends to the abdominal wall. This drain was attached to suction bulb drainage. Electronically Signed   By: Aletta Edouard M.D.   On: 09/30/2015 13:49   Ct Image Guided Drainage Percut Cath  Peritoneal Retroperit  Result Date: 09/30/2015 CLINICAL DATA:  History of ischemic colitis with prior colectomy, colostomy and recurrent abdominal abscesses as well as persistent recent purulent drainage from a left lower quadrant abdominal wall fistula at the site of prior drainage catheter placement. EXAM: 1. CT GUIDED CATHETER DRAINAGE OF LEFT RETROPERITONEAL ABSCESS 2. CT-GUIDED CATHETER DRAINAGE OF LEFT ABDOMINAL WALL FISTULA ANESTHESIA/SEDATION: 1.0 mg IV Versed 50 mcg IV Fentanyl Total Moderate Sedation Time:  20 minutes The patient's level of consciousness and physiologic status were continuously monitored during the procedure by Radiology nursing. PROCEDURE: The procedure, risks, benefits, and alternatives were explained to the patient. Questions regarding the procedure were encouraged and answered. The patient understands and consents to the procedure. A time out was performed prior to initiating the procedure. The abdominal wall was prepped with chlorhexidine in a sterile fashion, and a sterile drape was applied covering the operative field. A sterile gown and sterile gloves were used for the procedure. Local anesthesia was provided with 1% Lidocaine. From a supine position, the left side was rolled up slightly. Initial unenhanced CT was performed. Under CT guidance, an 18 gauge trocar needle was advanced to the level of the left retroperitoneal abscess. Fluid was aspirated and a sample sent for culture analysis. A  guidewire was advanced through the needle. The percutaneous tract was dilated and a 12 French percutaneous drainage catheter placed. Catheter position was confirmed by CT. The catheter was flushed and connected to a suction bulb. A 5 French dilator was advanced into the draining fistula of the left lower quadrant abdominal wall at the site of previous catheter insertion. A guidewire was advanced through the dilator. Over the wire, an 8 [place] drain was placed. Drain position was confirmed by CT. The drain was connected to a suction bulb. Both drains were secured at the skin with Prolene retention sutures and adhesive StatLock devices. COMPLICATIONS: None FINDINGS: There was immediate purulent fluid return from the large left-sided retroperitoneal abscess located posterior to the kidney and along the lateral margin of the psoas muscle. Given purulent drainage from the left lower quadrant old drain exit site, decision was made to try to place a catheter in the fistulous tract. The was ability to advance an 8 [place] drain over a guidewire and CT shows the drain terminating just at the level of the abdominal wall. The left retroperitoneal abscess drainage catheter will be flushed. The left lower quadrant fistula tract drain will not be initially flushed in order to try to get the fistula to close. IMPRESSION: 1. CT-guided drainage of left-sided retroperitoneal abscess with placement of 12 French drainage catheter. This catheter was connected to suction bulb drainage. A sample of purulent fluid was sent for culture analysis. 2. CT-guided placement of 8 French drainage catheter into left lower quadrant abdominal wall fistula at the site of  prior drainage catheter placement. This strain extends to the abdominal wall. This drain was attached to suction bulb drainage. Electronically Signed   By: Aletta Edouard M.D.   On: 09/30/2015 13:49    Labs:  CBC:  Recent Labs  07/29/15 0420 09/28/15 1940 09/30/15 0332  10/01/15 0712  WBC 8.9 17.1* 18.9* 11.5*  HGB 12.8* 14.3 13.3 13.6  HCT 39.2 44.6 41.5 42.6  PLT 365 648* 585* 549*    COAGS:  Recent Labs  04/13/15 1730  08/10/15 1638 08/31/15 1631 09/29/15 0138 09/30/15 0833  INR 2.62*  < > 1.7 1.8 2.08 1.32  APTT 49*  --   --   --   --   --   < > = values in this interval not displayed.  BMP:  Recent Labs  07/29/15 0420 09/28/15 1940 09/30/15 0332 10/01/15 0712  NA 140 134* 134* 139  K 4.1 4.7 4.4 4.2  CL 102 97* 100* 103  CO2 30 29 26 28   GLUCOSE 107* 147* 261* 135*  BUN 5* 10 13 15   CALCIUM 8.9 9.1 8.9 8.8*  CREATININE 0.97 0.96 0.78 0.89  GFRNONAA >60 >60 >60 >60  GFRAA >60 >60 >60 >60    LIVER FUNCTION TESTS:  Recent Labs  07/25/15 1441 07/26/15 0548 09/28/15 1940 10/01/15 0712  BILITOT 0.5 0.7 0.6 0.6  AST 18 13* 24 17  ALT 22 17 48 37  ALKPHOS 88 74 94 68  PROT 8.4* 7.0 7.6 6.2*  ALBUMIN 3.3* 2.6* 2.7* 2.5*    Assessment and Plan: S/p drainage of left RP/LLQ abdominal abscesses 9/13; AF; WBC 11.5(18.9); creat ok; check final cx's/sens; flush left RP drain with sterile NS tid; other plans as per CCS/IM; check f/u CT once drain outputs are minimal    Electronically Signed: D. Rowe Robert 10/01/2015, 9:35 AM   I spent a total of 15 minutes at the the patient's bedside AND on the patient's hospital floor or unit, greater than 50% of which was counseling/coordinating care for abdominal abscess drains

## 2015-10-01 NOTE — Progress Notes (Signed)
Patient ID: William Fischer, male   DOB: [birth year], 50 y.o.   MRN: [id number]  [facility] Surgery Progress Note     Subjective: Feeling better since placement of 2 perc drains yesterday. Tolerating diet. +BM.   Objective: Vital signs in last 24 hours: Temp:  [97.5 F (36.4 C)-97.7 F (36.5 C)] 97.7 F (36.5 C) (09/14 0546) Pulse Rate:  [67-113] 67 (09/14 0546) Resp:  [17-28] 18 (09/14 0546) BP: (95-126)/(56-81) 95/60 (09/14 0546) SpO2:  [94 %-99 %] 99 % (09/14 0546) Last BM Date: 09/30/15  Intake/Output from previous day: 09/13 0701 - 09/14 0700 In: 1040 [P.O.:700; IV Piggyback:300] Out: 1040 [Urine:825; Drains:215] Intake/Output this shift: No intake/output data recorded.  PE: Gen:  Alert, NAD, cooperative Pulm:  CTAB, unlabored respirations Abd: Soft, obese, ND, appropriately tender around drains, +BS, drains LLQ and posterior/left flank  LLQ drain - 15cc cloudy/white/red output Left posterior flank drain - 200cc cloudy/white/red output  Lab Results:   Recent Labs  09/30/15 0332 10/01/15 0712  WBC 18.9* 11.5*  HGB 13.3 13.6  HCT 41.5 42.6  PLT 585* 549*   BMET  Recent Labs  09/30/15 0332 10/01/15 0712  NA 134* 139  K 4.4 4.2  CL 100* 103  CO2 26 28  GLUCOSE 261* 135*  BUN 13 15  CREATININE 0.78 0.89  CALCIUM 8.9 8.8*   PT/INR  Recent Labs  09/29/15 0138 09/30/15 0833  LABPROT 23.7* 16.5*  INR 2.08 1.32   CMP     Component Value Date/Time   NA 139 10/01/2015 0712   K 4.2 10/01/2015 0712   CL 103 10/01/2015 0712   CO2 28 10/01/2015 0712   GLUCOSE 135 (H) 10/01/2015 0712   BUN 15 10/01/2015 0712   CREATININE 0.89 10/01/2015 0712   CALCIUM 8.8 (L) 10/01/2015 0712   PROT 6.2 (L) 10/01/2015 0712   ALBUMIN 2.5 (L) 10/01/2015 0712   AST 17 10/01/2015 0712   ALT 37 10/01/2015 0712   ALKPHOS 68 10/01/2015 0712   BILITOT 0.6 10/01/2015 0712   GFRNONAA >60 10/01/2015 0712   GFRAA >60 10/01/2015 0712   Lipase     Component Value  Date/Time   LIPASE 24 04/12/2015 0210       Studies/Results: Ct Image Guided Drainage By Percutaneous Catheter  Result Date: 09/30/2015 CLINICAL DATA:  History of ischemic colitis with prior colectomy, colostomy and recurrent abdominal abscesses as well as persistent recent purulent drainage from a left lower quadrant abdominal wall fistula at the site of prior drainage catheter placement. EXAM: 1. CT GUIDED CATHETER DRAINAGE OF LEFT RETROPERITONEAL ABSCESS 2. CT-GUIDED CATHETER DRAINAGE OF LEFT ABDOMINAL WALL FISTULA ANESTHESIA/SEDATION: 1.0 mg IV Versed 50 mcg IV Fentanyl Total Moderate Sedation Time:  20 minutes The patient's level of consciousness and physiologic status were continuously monitored during the procedure by Radiology nursing. PROCEDURE: The procedure, risks, benefits, and alternatives were explained to the patient. Questions regarding the procedure were encouraged and answered. The patient understands and consents to the procedure. A time out was performed prior to initiating the procedure. The abdominal wall was prepped with chlorhexidine in a sterile fashion, and a sterile drape was applied covering the operative field. A sterile gown and sterile gloves were used for the procedure. Local anesthesia was provided with 1% Lidocaine. From a supine position, the left side was rolled up slightly. Initial unenhanced CT was performed. Under CT guidance, an 18 gauge trocar needle was advanced to the level of the left retroperitoneal abscess. Fluid was aspirated  and a sample sent for culture analysis. A guidewire was advanced through the needle. The percutaneous tract was dilated and a 12 French percutaneous drainage catheter placed. Catheter position was confirmed by CT. The catheter was flushed and connected to a suction bulb. A 5 French dilator was advanced into the draining fistula of the left lower quadrant abdominal wall at the site of previous catheter insertion. A guidewire was advanced  through the dilator. Over the wire, an 8 [place] drain was placed. Drain position was confirmed by CT. The drain was connected to a suction bulb. Both drains were secured at the skin with Prolene retention sutures and adhesive StatLock devices. COMPLICATIONS: None FINDINGS: There was immediate purulent fluid return from the large left-sided retroperitoneal abscess located posterior to the kidney and along the lateral margin of the psoas muscle. Given purulent drainage from the left lower quadrant old drain exit site, decision was made to try to place a catheter in the fistulous tract. The was ability to advance an 8 [place] drain over a guidewire and CT shows the drain terminating just at the level of the abdominal wall. The left retroperitoneal abscess drainage catheter will be flushed. The left lower quadrant fistula tract drain will not be initially flushed in order to try to get the fistula to close. IMPRESSION: 1. CT-guided drainage of left-sided retroperitoneal abscess with placement of 12 French drainage catheter. This catheter was connected to suction bulb drainage. A sample of purulent fluid was sent for culture analysis. 2. CT-guided placement of 8 French drainage catheter into left lower quadrant abdominal wall fistula at the site of prior drainage catheter placement. This strain extends to the abdominal wall. This drain was attached to suction bulb drainage. Electronically Signed   By: Aletta Edouard M.D.   On: 09/30/2015 13:49   Ct Image Guided Drainage Percut Cath  Peritoneal Retroperit  Result Date: 09/30/2015 CLINICAL DATA:  History of ischemic colitis with prior colectomy, colostomy and recurrent abdominal abscesses as well as persistent recent purulent drainage from a left lower quadrant abdominal wall fistula at the site of prior drainage catheter placement. EXAM: 1. CT GUIDED CATHETER DRAINAGE OF LEFT RETROPERITONEAL ABSCESS 2. CT-GUIDED CATHETER DRAINAGE OF LEFT ABDOMINAL WALL FISTULA  ANESTHESIA/SEDATION: 1.0 mg IV Versed 50 mcg IV Fentanyl Total Moderate Sedation Time:  20 minutes The patient's level of consciousness and physiologic status were continuously monitored during the procedure by Radiology nursing. PROCEDURE: The procedure, risks, benefits, and alternatives were explained to the patient. Questions regarding the procedure were encouraged and answered. The patient understands and consents to the procedure. A time out was performed prior to initiating the procedure. The abdominal wall was prepped with chlorhexidine in a sterile fashion, and a sterile drape was applied covering the operative field. A sterile gown and sterile gloves were used for the procedure. Local anesthesia was provided with 1% Lidocaine. From a supine position, the left side was rolled up slightly. Initial unenhanced CT was performed. Under CT guidance, an 18 gauge trocar needle was advanced to the level of the left retroperitoneal abscess. Fluid was aspirated and a sample sent for culture analysis. A guidewire was advanced through the needle. The percutaneous tract was dilated and a 12 French percutaneous drainage catheter placed. Catheter position was confirmed by CT. The catheter was flushed and connected to a suction bulb. A 5 French dilator was advanced into the draining fistula of the left lower quadrant abdominal wall at the site of previous catheter insertion. A guidewire was advanced  through the dilator. Over the wire, an 8 [place] drain was placed. Drain position was confirmed by CT. The drain was connected to a suction bulb. Both drains were secured at the skin with Prolene retention sutures and adhesive StatLock devices. COMPLICATIONS: None FINDINGS: There was immediate purulent fluid return from the large left-sided retroperitoneal abscess located posterior to the kidney and along the lateral margin of the psoas muscle. Given purulent drainage from the left lower quadrant old drain exit site, decision was  made to try to place a catheter in the fistulous tract. The was ability to advance an 8 [place] drain over a guidewire and CT shows the drain terminating just at the level of the abdominal wall. The left retroperitoneal abscess drainage catheter will be flushed. The left lower quadrant fistula tract drain will not be initially flushed in order to try to get the fistula to close. IMPRESSION: 1. CT-guided drainage of left-sided retroperitoneal abscess with placement of 12 French drainage catheter. This catheter was connected to suction bulb drainage. A sample of purulent fluid was sent for culture analysis. 2. CT-guided placement of 8 French drainage catheter into left lower quadrant abdominal wall fistula at the site of prior drainage catheter placement. This strain extends to the abdominal wall. This drain was attached to suction bulb drainage. Electronically Signed   By: Aletta Edouard M.D.   On: 09/30/2015 13:49    Anti-infectives: Anti-infectives    Start     Dose/Rate Route Frequency Ordered Stop   09/29/15 1815  vancomycin (VANCOCIN) 50 mg/mL oral solution 125 mg  Status:  Discontinued     125 mg Oral 4 times daily 09/29/15 1800 09/30/15 1009   09/29/15 1000  piperacillin-tazobactam (ZOSYN) IVPB 3.375 g     3.375 g 12.5 mL/hr over 240 Minutes Intravenous Every 8 hours 09/29/15 0126     09/29/15 0100  piperacillin-tazobactam (ZOSYN) IVPB 3.375 g     3.375 g 100 mL/hr over 30 Minutes Intravenous  Once 09/29/15 0051 09/29/15 0205       Assessment/Plan Recurrent Abdominal Abscess - left hemicolectomy with end colostomy for perforated ischemic colitis on 0000000 complicated by rectal stump leak with recurrent intraabdominal abscesses - CT scan 09/29/15 showed a new intra-abdominal/retroperitoneal abscess - s/p IR placement of 2 drains yesterday- one in the left sided retroperitoneal abscess (200cc output) and one in the left lower quadrant at the prior site of drain placement (15cc output) -  Continue Zosyn - preliminary culture gram positive cocci and gram negative/variable rob. Will continue to follow. - WBC trending down 11.5, afebrile  ID - Zosyn day 3, vanc 9/12/-9/13 FEN - CM VTE - heparin  Plan - continue drains and antibiotics. Will follow-up culture. Will discuss long term plan with MD.   LOS: 2 days    Jerrye Beavers , [facility] Surgery 10/01/2015, 8:41 AM Pager: [phone] Consults: [phone] Mon-Fri 7:00 am-4:30 pm Sat-Sun 7:00 am-11:30 am

## 2015-10-01 NOTE — Progress Notes (Signed)
Pharmacy Antibiotic Note  William Fischer is a 50 y.o. male admitted on 09/29/2015 with abdominal pain, fever, and purulent discharge from LLQ previous drain site.  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  Now with new intra-abdominal abscess requiring drainage. Pharmacy has been consulted to add vancomycin to Zosyn.  His cultures are showing GPC on the Gram stain.  Patient's renal function has been stable.  Noted he received contrast on 09/29/15.  Plan: - Vanc 1500 IV x 1, then 1gm IV Q8H - Continue Zosyn 3.375gm IV Q8H, 4 hr infusion per MD - Monitor renal fxn, micro data, vanc trough at C[facility] - F/U resume Xarelto when appropriate - BMET in AM   Height: 5' 10.5" (179.1 cm) Weight: 241 lb 2.9 oz (109.4 kg) IBW/kg (Calculated) : 74.15  Temp (24hrs), Avg:97.6 F (36.4 C), Min:97.5 F (36.4 C), Max:97.7 F (36.5 C)   Recent Labs Lab 09/28/15 1940 09/28/15 2000 09/29/15 0146 09/30/15 0332 10/01/15 0712  WBC 17.1*  --   --  18.9* 11.5*  CREATININE 0.96  --   --  0.78 0.89  LATICACIDVEN  --  1.81 1.10  --   --     Estimated Creatinine Clearance: 126.8 mL/min (by C-G formula based on SCr of 0.89 mg/dL).    Allergies  Allergen Reactions  . Contrast Media [Iodinated Diagnostic Agents] Rash    a diffuse macular rash after CTA chest  Pt was premedicated with 125mg  IV Solumedrol, 50mg  IV Benadryl 1 hr prior to CTexam, and tolerated procedure without any difficulties. 11/28/11 Also same pre med protocol observed on 02/22/15 and pt tolerated the procedure well    Antimicrobials this admission:  Zosyn 9/12 >> Vanc 9/14 >>  Dose adjustments this admission:  N/A  Microbiology results:  9/11 BCx x2 - NGTD 9/11 UCx - negative 9/12 abd wound - GNR, GPR, GPC 9/13 C.diff PCR - positive (colonization) 9/13 left abscess aspirate - GNR, GVR, GPC 9/13 left abd abscess - GPC, GVR   Erikka Follmer D. Mina Marble, PharmD, BCPS Pager:  [phone] 10/01/2015, 9:22 AM

## 2015-10-02 DIAGNOSIS — Z9049 Acquired absence of other specified parts of digestive tract: Secondary | ICD-10-CM

## 2015-10-02 LAB — CBC
HEMATOCRIT: 41.1 % (ref 39.0–52.0)
HEMOGLOBIN: 13.2 g/dL (ref 13.0–17.0)
MCH: 29.2 pg (ref 26.0–34.0)
MCHC: 32.1 g/dL (ref 30.0–36.0)
MCV: 90.9 fL (ref 78.0–100.0)
Platelets: 582 10*3/uL — ABNORMAL HIGH (ref 150–400)
RBC: 4.52 MIL/uL (ref 4.22–5.81)
RDW: 15.7 % — ABNORMAL HIGH (ref 11.5–15.5)
WBC: 14.1 10*3/uL — ABNORMAL HIGH (ref 4.0–10.5)

## 2015-10-02 LAB — AEROBIC CULTURE W GRAM STAIN (SUPERFICIAL SPECIMEN): Culture: NO GROWTH

## 2015-10-02 LAB — AEROBIC CULTURE  (SUPERFICIAL SPECIMEN)

## 2015-10-02 LAB — BASIC METABOLIC PANEL
ANION GAP: 7 (ref 5–15)
BUN: 9 mg/dL (ref 6–20)
CALCIUM: 9.1 mg/dL (ref 8.9–10.3)
CO2: 29 mmol/L (ref 22–32)
Chloride: 101 mmol/L (ref 101–111)
Creatinine, Ser: 0.84 mg/dL (ref 0.61–1.24)
Glucose, Bld: 117 mg/dL — ABNORMAL HIGH (ref 65–99)
POTASSIUM: 4 mmol/L (ref 3.5–5.1)
Sodium: 137 mmol/L (ref 135–145)

## 2015-10-02 MED ORDER — CEFDINIR 300 MG PO CAPS: 300.0000 mg | ORAL_CAPSULE | Freq: Two times a day (BID) | ORAL | 0 refills | Status: DC

## 2015-10-02 MED ORDER — RIVAROXABAN 20 MG PO TABS: 20.0000 mg | ORAL_TABLET | Freq: Every day | ORAL | Status: DC

## 2015-10-02 MED ORDER — METRONIDAZOLE 500 MG PO TABS: 500.0000 mg | ORAL_TABLET | Freq: Four times a day (QID) | ORAL | 0 refills | Status: DC

## 2015-10-02 NOTE — Progress Notes (Signed)
Pt discharged home in stable condition after discharge instructions were given with no concerns voiced. Discharge meds sent electronically to [facility]

## 2015-10-02 NOTE — Progress Notes (Signed)
   Subjective:   Patient is feeling well. No abdominal pain currently. Has some white discharge coming from his abdominal drains. No fevers.  Objective:  Vital signs in last 24 hours: Vitals:   10/01/15 0546 10/01/15 1502 10/01/15 2305 10/02/15 0526  BP: 95/60 91/66 (!) 97/55 105/60  Pulse: 67 62 87 73  Resp: 18 18 20 18   Temp: 97.7 F (36.5 C) 97.7 F (36.5 C) 98.1 F (36.7 C) 98 F (36.7 C)  TempSrc: Oral Oral Oral Oral  SpO2: 99% 100% 96% 96%  Weight:      Height:       Vitals reviewed. General: resting in bed, NAD HEENT: PERRL, EOMI, no scleral icterus Cardiac: RRR, no rubs, murmurs or gallops Pulm: clear to auscultation bilaterally, no wheezes, rales, or rhonchi Abd: soft, nontender, nondistended, BS present Ext: warm and well perfused, no pedal edema Neuro: alert and oriented X3, cranial nerves II-XII grossly intact, strength and sensation to light touch equal in bilateral upper and lower extremities   Assessment/Plan:  Active Problems:   Depression with anxiety   Morbid obesity (HCC)   Long term (current) use of anticoagulants [Z79.01]   Abdominal abscess (HCC)   Chronic atrial fibrillation (HCC)   NICM (nonischemic cardiomyopathy) (HCC)  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 Had drain placed by IR in the past for previous abscesses. Had 2 drains placed this admission on 9/13 for the recurrent left sided abdominal abscess. Cultures growing gram positive cocci and gram negative rods - surgery plans for gastrograffin enema to assess the rectal stump for leakage to make sure that's not the etiology of the recurrent abscesses.  - appreciate IR 's assistance for the drain placement. Leave drains in, follow up at the drain clinic. - was on ZOSYN here. Will do total 14 days course of cefdinir and flagyl from 9/13 to 9/27.  - home  today.  NICM with EF 45-50%  - cont digoxin, dilt, and metoprolol. Hold lasix and lisinopril with his low BP's.   Chronic AFib  - cont xarelto - cont digoxin, dilt, and metoprolol.   Dispo: Anticipated discharge in approximately today  Dellia Nims, MD 10/02/2015, 11:45 AM Pager: [phone]

## 2015-10-02 NOTE — Progress Notes (Signed)
Subjective: Patient did well overnight. He is frustrated because the abscesses keep recurring, feels ready to go home. Underwent drain placement for L RP abscess and L superficial abscess. Pain is well-controlled. No chest pain, shortness of breath. Mild abdominal pain unchanged from yesterday. Patient has been able to ambulate. Tolerating diet well.  Objective: Vital signs in last 24 hours: Vitals:   10/01/15 0546 10/01/15 1502 10/01/15 2305 10/02/15 0526  BP: 95/60 91/66 (!) 97/55 105/60  Pulse: 67 62 87 73  Resp: 18 18 20 18   Temp: 97.7 F (36.5 C) 97.7 F (36.5 C) 98.1 F (36.7 C) 98 F (36.7 C)  TempSrc: Oral Oral Oral Oral  SpO2: 99% 100% 96% 96%  Weight:      Height:       Weight change:   Intake/Output Summary (Last 24 hours) at 10/02/15 0930 Last data filed at 10/02/15 0900  Gross per 24 hour  Intake              875 ml  Output             1535 ml  Net             -660 ml   BP 105/60 (BP Location: Right Arm)   Pulse 73   Temp 98 F (36.7 C) (Oral)   Resp 18   Ht 5' 10.5" (1.791 m)   Wt 241 lb 2.9 oz (109.4 kg)   SpO2 96%   BMI 34.12 kg/m    General appearance: alert, cooperative, appears stated age, no distress and mildly obese Head: Normocephalic, without obvious abnormality, atraumatic Eyes: Anicteric sclerae Lungs: clear to auscultation bilaterally Heart: Irregular rhythm Abdomen: Soft. Bowel sounds are normal.  RUQ colostomy pink, has loose bowel. No blood or puss noted.  Midline surgical scar. LLQ drain with purulent discharge. L retroperitoneal drain with purulent discharge. Mild tenderness to palpation noted at this area. No rebound or guarding, no peritonitis.   Extremities: No edema. Warm and well-profused Pulses: 2+ and symmetric Skin: Skin color, texture, turgor normal. No rashes or lesions  Psych: Flat affect  Lab Results: Basic Metabolic Panel:  Recent Labs Lab 10/01/15 0712 10/02/15 0638  NA 139 137  K 4.2 4.0  CL 103 101  CO2 28  29  GLUCOSE 135* 117*  BUN 15 9  CREATININE 0.89 0.84  CALCIUM 8.8* 9.1   Liver Function Tests:  Recent Labs Lab 09/28/15 1940 10/01/15 0712  AST 24 17  ALT 48 37  ALKPHOS 94 68  BILITOT 0.6 0.6  PROT 7.6 6.2*  ALBUMIN 2.7* 2.5*   No results for input(s): LIPASE, AMYLASE in the last 168 hours. No results for input(s): AMMONIA in the last 168 hours. CBC:  Recent Labs Lab 09/28/15 1940 09/30/15 0332 10/01/15 0712 10/02/15 0638  WBC 17.1* 18.9* 11.5* 14.1*  NEUTROABS 13.1* 15.6*  --   --   HGB 14.3 13.3 13.6 13.2  HCT 44.6 41.5 42.6 41.1  MCV 92.1 91.8 91.8 90.9  PLT 648* 585* 549* 582*   Cardiac Enzymes: No results for input(s): CKTOTAL, CKMB, CKMBINDEX, TROPONINI in the last 168 hours. BNP: No results for input(s): PROBNP in the last 168 hours. D-Dimer: No results for input(s): DDIMER in the last 168 hours. CBG: No results for input(s): GLUCAP in the last 168 hours. Hemoglobin A1C: No results for input(s): HGBA1C in the last 168 hours. Fasting Lipid Panel: No results for input(s): CHOL, HDL, LDLCALC, TRIG, CHOLHDL, LDLDIRECT in the last 168  hours. Thyroid Function Tests: No results for input(s): TSH, T4TOTAL, FREET4, T3FREE, THYROIDAB in the last 168 hours. Coagulation:  Recent Labs Lab 09/29/15 0138 09/30/15 0833  LABPROT 23.7* 16.5*  INR 2.08 1.32   Anemia Panel: No results for input(s): VITAMINB12, FOLATE, FERRITIN, TIBC, IRON, RETICCTPCT in the last 168 hours. Urine Drug Screen: Drugs of Abuse  No results found for: LABOPIA, COCAINSCRNUR, LABBENZ, AMPHETMU, THCU, LABBARB  Alcohol Level: No results for input(s): ETH in the last 168 hours. Urinalysis:  Recent Labs Lab 09/28/15 1921  COLORURINE AMBER*  LABSPEC 1.029  PHURINE 6.0  GLUCOSEU NEGATIVE  HGBUR NEGATIVE  BILIRUBINUR NEGATIVE  KETONESUR NEGATIVE  PROTEINUR 30*  NITRITE NEGATIVE  LEUKOCYTESUR NEGATIVE   Misc. Labs: C. Diff antigen positive, toxin negative, PCR  positive  Micro Results: Recent Results (from the past 240 hour(s))  Urine culture     Status: None   Collection Time: 09/28/15  7:21 PM  Result Value Ref Range Status   Specimen Description URINE, RANDOM  Final   Special Requests NONE  Final   Culture NO GROWTH  Final   Report Status 09/30/2015 FINAL  Final  Culture, blood (Routine x 2)     Status: None (Preliminary result)   Collection Time: 09/28/15  7:29 PM  Result Value Ref Range Status   Specimen Description BLOOD RIGHT ARM  Final   Special Requests BOTTLES DRAWN AEROBIC AND ANAEROBIC 5CC  Final   Culture NO GROWTH 3 DAYS  Final   Report Status PENDING  Incomplete  Culture, blood (Routine x 2)     Status: None (Preliminary result)   Collection Time: 09/28/15  7:35 PM  Result Value Ref Range Status   Specimen Description BLOOD LEFT ARM  Final   Special Requests IN PEDIATRIC BOTTLE 4CC  Final   Culture NO GROWTH 3 DAYS  Final   Report Status PENDING  Incomplete  Wound or Superficial Culture     Status: None (Preliminary result)   Collection Time: 09/29/15  1:39 AM  Result Value Ref Range Status   Specimen Description WOUND  Final   Special Requests ABDOMEN  Final   Gram Stain   Final    MODERATE WBC PRESENT,BOTH PMN AND MONONUCLEAR FEW GRAM POSITIVE COCCI IN PAIRS FEW GRAM NEGATIVE RODS RARE GRAM POSITIVE RODS    Culture CULTURE REINCUBATED FOR BETTER GROWTH  Final   Report Status PENDING  Incomplete  C difficile quick scan w PCR reflex     Status: Abnormal   Collection Time: 09/29/15  9:10 AM  Result Value Ref Range Status   C Diff antigen POSITIVE (A) NEGATIVE Final   C Diff toxin NEGATIVE NEGATIVE Final   C Diff interpretation Results are indeterminate. See PCR results.  Final  Clostridium Difficile by PCR     Status: Abnormal   Collection Time: 09/29/15  9:10 AM  Result Value Ref Range Status   Toxigenic C Difficile by pcr POSITIVE (A) NEGATIVE Final    Comment: Positive for toxigenic C. difficile with little  to no toxin production. Only treat if clinical presentation suggests symptomatic illness. CRITICAL RESULT CALLED TO, READ BACK BY AND VERIFIED WITH: P. MOSS, RN AT [id number] ON 09/29/15 BY C. JESSUP, MLT.   Aerobic/Anaerobic Culture (surgical/deep wound)     Status: None (Preliminary result)   Collection Time: 09/30/15 11:36 AM  Result Value Ref Range Status   Specimen Description ASPIRATE LEFT ABSCESS  Final   Special Requests   Final    ASPIRATE  FROM LEFT FLANK AND RETROPERITONEAL ABSCESS   Gram Stain   Final    NO WBC SEEN ABUNDANT GRAM POSITIVE COCCI IN PAIRS IN CLUSTERS MODERATE GRAM NEGATIVE RODS FEW GRAM VARIABLE ROD    Culture CULTURE REINCUBATED FOR BETTER GROWTH  Final   Report Status PENDING  Incomplete  Aerobic Culture (superficial specimen)     Status: None (Preliminary result)   Collection Time: 09/30/15 12:05 PM  Result Value Ref Range Status   Specimen Description ABSCESS ABDOMEN LEFT  Final   Special Requests NONE  Final   Gram Stain   Final    RARE WBC PRESENT, PREDOMINANTLY PMN FEW GRAM POSITIVE COCCI IN PAIRS IN CLUSTERS RARE GRAM VARIABLE ROD    Culture NO GROWTH 1 DAY  Final   Report Status PENDING  Incomplete   Studies/Results: Ct Image Guided Drainage By Percutaneous Catheter  Result Date: 09/30/2015 CLINICAL DATA:  History of ischemic colitis with prior colectomy, colostomy and recurrent abdominal abscesses as well as persistent recent purulent drainage from a left lower quadrant abdominal wall fistula at the site of prior drainage catheter placement. EXAM: 1. CT GUIDED CATHETER DRAINAGE OF LEFT RETROPERITONEAL ABSCESS 2. CT-GUIDED CATHETER DRAINAGE OF LEFT ABDOMINAL WALL FISTULA ANESTHESIA/SEDATION: 1.0 mg IV Versed 50 mcg IV Fentanyl Total Moderate Sedation Time:  20 minutes The patient's level of consciousness and physiologic status were continuously monitored during the procedure by Radiology nursing. PROCEDURE: The procedure, risks, benefits, and  alternatives were explained to the patient. Questions regarding the procedure were encouraged and answered. The patient understands and consents to the procedure. A time out was performed prior to initiating the procedure. The abdominal wall was prepped with chlorhexidine in a sterile fashion, and a sterile drape was applied covering the operative field. A sterile gown and sterile gloves were used for the procedure. Local anesthesia was provided with 1% Lidocaine. From a supine position, the left side was rolled up slightly. Initial unenhanced CT was performed. Under CT guidance, an 18 gauge trocar needle was advanced to the level of the left retroperitoneal abscess. Fluid was aspirated and a sample sent for culture analysis. A guidewire was advanced through the needle. The percutaneous tract was dilated and a 12 French percutaneous drainage catheter placed. Catheter position was confirmed by CT. The catheter was flushed and connected to a suction bulb. A 5 French dilator was advanced into the draining fistula of the left lower quadrant abdominal wall at the site of previous catheter insertion. A guidewire was advanced through the dilator. Over the wire, an 8 [place] drain was placed. Drain position was confirmed by CT. The drain was connected to a suction bulb. Both drains were secured at the skin with Prolene retention sutures and adhesive StatLock devices. COMPLICATIONS: None FINDINGS: There was immediate purulent fluid return from the large left-sided retroperitoneal abscess located posterior to the kidney and along the lateral margin of the psoas muscle. Given purulent drainage from the left lower quadrant old drain exit site, decision was made to try to place a catheter in the fistulous tract. The was ability to advance an 8 [place] drain over a guidewire and CT shows the drain terminating just at the level of the abdominal wall. The left retroperitoneal abscess drainage catheter will be flushed. The left lower  quadrant fistula tract drain will not be initially flushed in order to try to get the fistula to close. IMPRESSION: 1. CT-guided drainage of left-sided retroperitoneal abscess with placement of 12 French drainage catheter. This catheter was  connected to suction bulb drainage. A sample of purulent fluid was sent for culture analysis. 2. CT-guided placement of 8 French drainage catheter into left lower quadrant abdominal wall fistula at the site of prior drainage catheter placement. This strain extends to the abdominal wall. This drain was attached to suction bulb drainage. Electronically Signed   By: Aletta Edouard M.D.   On: 09/30/2015 13:49   Ct Image Guided Drainage Percut Cath  Peritoneal Retroperit  Result Date: 09/30/2015 CLINICAL DATA:  History of ischemic colitis with prior colectomy, colostomy and recurrent abdominal abscesses as well as persistent recent purulent drainage from a left lower quadrant abdominal wall fistula at the site of prior drainage catheter placement. EXAM: 1. CT GUIDED CATHETER DRAINAGE OF LEFT RETROPERITONEAL ABSCESS 2. CT-GUIDED CATHETER DRAINAGE OF LEFT ABDOMINAL WALL FISTULA ANESTHESIA/SEDATION: 1.0 mg IV Versed 50 mcg IV Fentanyl Total Moderate Sedation Time:  20 minutes The patient's level of consciousness and physiologic status were continuously monitored during the procedure by Radiology nursing. PROCEDURE: The procedure, risks, benefits, and alternatives were explained to the patient. Questions regarding the procedure were encouraged and answered. The patient understands and consents to the procedure. A time out was performed prior to initiating the procedure. The abdominal wall was prepped with chlorhexidine in a sterile fashion, and a sterile drape was applied covering the operative field. A sterile gown and sterile gloves were used for the procedure. Local anesthesia was provided with 1% Lidocaine. From a supine position, the left side was rolled up slightly. Initial  unenhanced CT was performed. Under CT guidance, an 18 gauge trocar needle was advanced to the level of the left retroperitoneal abscess. Fluid was aspirated and a sample sent for culture analysis. A guidewire was advanced through the needle. The percutaneous tract was dilated and a 12 French percutaneous drainage catheter placed. Catheter position was confirmed by CT. The catheter was flushed and connected to a suction bulb. A 5 French dilator was advanced into the draining fistula of the left lower quadrant abdominal wall at the site of previous catheter insertion. A guidewire was advanced through the dilator. Over the wire, an 8 [place] drain was placed. Drain position was confirmed by CT. The drain was connected to a suction bulb. Both drains were secured at the skin with Prolene retention sutures and adhesive StatLock devices. COMPLICATIONS: None FINDINGS: There was immediate purulent fluid return from the large left-sided retroperitoneal abscess located posterior to the kidney and along the lateral margin of the psoas muscle. Given purulent drainage from the left lower quadrant old drain exit site, decision was made to try to place a catheter in the fistulous tract. The was ability to advance an 8 [place] drain over a guidewire and CT shows the drain terminating just at the level of the abdominal wall. The left retroperitoneal abscess drainage catheter will be flushed. The left lower quadrant fistula tract drain will not be initially flushed in order to try to get the fistula to close. IMPRESSION: 1. CT-guided drainage of left-sided retroperitoneal abscess with placement of 12 French drainage catheter. This catheter was connected to suction bulb drainage. A sample of purulent fluid was sent for culture analysis. 2. CT-guided placement of 8 French drainage catheter into left lower quadrant abdominal wall fistula at the site of prior drainage catheter placement. This strain extends to the abdominal wall. This drain  was attached to suction bulb drainage. Electronically Signed   By: Aletta Edouard M.D.   On: 09/30/2015 13:49   Medications: I  have reviewed the patient's current medications. Scheduled Meds: . digoxin  0.125 mg Oral Daily  . diltiazem  180 mg Oral BID  . heparin subcutaneous  5,000 Units Subcutaneous Q8H  . metoprolol  100 mg Oral BID  . piperacillin-tazobactam (ZOSYN)  IV  3.375 g Intravenous Q8H   Continuous Infusions:  PRN Meds:.morphine injection, oxyCODONE-acetaminophen Assessment/Plan: Active Problems:   Depression with anxiety   Morbid obesity (HCC)   Long term (current) use of anticoagulants [Z79.01]   Abdominal abscess (HCC)   Chronic atrial fibrillation (HCC)   NICM (nonischemic cardiomyopathy) ([place])  50 yo male with recurrent abdominal abscesses. HDS.   1.) Recurrent abdominal abscesses: Likely d/t complication from previous surgery (hemicolectomy for perforated ischemic colitis vs. Diverticulitis). IR has placed a drain in the previous site and also in the retroperitoneal abscess site. Surgery is following. Zosyn started in the ED. Left retroperitoneal aspirate Gram stain showed: abundant GPC in pairs in clusters, moderate GNRs, and few Gram variable rods. Superficial abscess Gram stain showed: Few GPC in clusters and rare GNRs. Patient improving clinically. Will transition to PO Abx for discharge.   - Monitor drain output   - Cefdinir/Flagyl   - Follow-up wound culture/aspirate   - Continue prn oxycodone 5-325 mg 2 tabs q6h for pain. Added 4 mg morphine q3h prn   - Patient to see surgery as outpatient for rectal enema  2.) NICM with EF 45-50%: Hold lisinopril, and lasix in the setting of soft pressures.   - Continue Digoxin 0.125 QD   - Continue Diltiazem 180 mg BID   - Resume Metoprolol 100 BID  3.) Positive C. Diff Antigen/PCR: Patient noted no change in ostomy bag output. Pain is unchanged. Likely colonized with C. Diff, but concern for C. Diff colitis is low   -  D/C PO Vancomycin  4.) Chronic Atrial Fibrillation: ChadsVASC score of 2, on Xarelto    - Resume Xarelto   - Continue Metoprolol 100 mg BID for rate control  DVT/VTE Ppx: Heparin TID FEN: CM diet Code: Full  Dispo: Patient is stable enough to be discharged home.   This is a Careers information officer Note.  The care of the patient was discussed with Dr. Genene Churn and the assessment and plan formulated with their assistance.  Please see their attached note for official documentation of the daily encounter.   LOS: 3 days   Jethro Bolus, Medical Student 10/02/2015, 9:30 AM

## 2015-10-02 NOTE — Discharge Summary (Signed)
Name: William Fischer MRN: [id number] DOB: 05/02/1965 50 y.o. PCP: Norman Herrlich, MD  Date of Admission: 09/29/2015 12:04 AM Date of Discharge: 10/02/2015 Attending Physician: Axel Filler, MD  Discharge Diagnosis: 1. Retroperitoneal Abscess 2. Left Anterior Abdominal Abscess  Active Problems:   Depression with anxiety   Morbid obesity (HCC)   Long term (current) use of anticoagulants [Z79.01]   Abdominal abscess (HCC)   Chronic atrial fibrillation (HCC)   NICM (nonischemic cardiomyopathy) ([place])   Discharge Medications:   Medication List    STOP taking these medications   furosemide 40 MG tablet Commonly known as:  LASIX   lisinopril 5 MG tablet Commonly known as:  PRINIVIL,ZESTRIL     TAKE these medications   cefdinir 300 MG capsule Commonly known as:  OMNICEF Take 1 capsule (300 mg total) by mouth 2 (two) times daily.   digoxin 0.125 MG tablet Commonly known as:  LANOXIN Take 1 tablet (0.125 mg total) by mouth daily.   diltiazem 180 MG 24 hr capsule Commonly known as:  CARDIZEM CD Take 1 capsule (180 mg total) by mouth 2 (two) times daily.   metoprolol 100 MG tablet Commonly known as:  LOPRESSOR Take 1 tablet (100 mg total) by mouth 2 (two) times daily.   metroNIDAZOLE 500 MG tablet Commonly known as:  FLAGYL Take 1 tablet (500 mg total) by mouth 4 (four) times daily.   multivitamin with minerals Tabs tablet Take 1 tablet by mouth daily.   rivaroxaban 20 MG Tabs tablet Commonly known as:  XARELTO Take 1 tablet (20 mg total) by mouth daily with supper.       Disposition and follow-up:   [name] was discharged from [facility] in Good condition.  At the hospital follow up visit please address:  1.  Is pain controlled? Having fevers? Tolerating antibiotics? Did he follow up at the [facility] clinic and with Surgery?  Consider restarting his lisinopril and lasix if needed.   2.  Labs / imaging needed at time of  follow-up: none  3.  Pending labs/ test needing follow-up:    - Culture results from abscesses  Follow-up Appointments: Follow-up Information    [place]. Go on 10/05/2015.   Why:  Appointment is at 2:45 PM Contact information: [address] [place] Hospital Course by problem list: Active Problems:   Depression with anxiety   Morbid obesity ([place])   Long term (current) use of anticoagulants [Z79.01]   Abdominal abscess ([place])   Chronic atrial fibrillation (HCC)   NICM (nonischemic cardiomyopathy) ([place])   1. 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 He presented with fevers, chills, malaise/fatigue, and abdominal pain. CT showed L retroperitoneal abscess measuring 5.9 x 4.2 x 15.7 cm and recurrence of L anterior abdominal abscess measuring 1.7 x 4.2 cm. He was started on Zosyn and IR placed a drain in each of the sites. We cultured discharge from the L anterior abdominal abscess and IR aspirated discharge from the RP abscess for culture. Left retroperitoneal aspirate Gram stain showed: abundant GPC in pairs in clusters, moderate GNRs, and few Gram variable rods. Left anterior abdominal abscess Gram stain showed: Few GPC in clusters and rare GNRs. Surgery wants to do a gastrografin enema study as an outpatient  for more definitive management of the recurring abscesses. We transitioned him to PO antibiotics: Omnicef/Flagyl x 14 days total from 9/13 to 9/27.  2. Hyperglycemia:   - Patient was noted to have elevated CBG. He said that he is not diabetic and refused further CBGs during admission. Last A1c was 6.0 in July 2017.  3. NICM with EF 45-50%  - cont digoxin, dilt, and metoprolol. Held lasix and lisinopril with his low BP's.   4. Chronic AFib  - cont  xarelto - cont digoxin, dilt, and metoprolol.   Discharge Vitals:   BP 105/60 (BP Location: Right Arm)   Pulse 73   Temp 98 F (36.7 C) (Oral)   Resp 18   Ht 5' 10.5" (1.791 m)   Wt 241 lb 2.9 oz (109.4 kg)   SpO2 96%   BMI 34.12 kg/m   Pertinent Labs, Studies, and Procedures:  09/13: IR placed drains in RP abscess and L anterior abdominal abscess  Discharge Instructions: Discharge Instructions    Discharge instructions    Complete by:  As directed    Please finish taking the antibiotics for 12 more days. Take flagyl 4 times a day and take cefdinir 2 times a day for 12 more days.  Follow up with the surgeons, with the drain clinic, and also with [place] at the internal medicine clinic.    - You were hospitalized due to complications from 2 abscesses in your abdomen. The Radiologists placed a drain in each one to drain out the infection. You have been prescribed two antibiotics, Omnicef (Cefdinir) and Flagyl (Metronidazole) and you will need to take them until 10/14/2015. You may eat your normal foods. Try to avoid heavy lifting or exercise while the drains are in place. Make sure you avoid consuming any alcohol while you are taking these antibiotics. If you have severe pain, worsening fever, your drains come out on their own, or large amounts of blood or pus from the drains, return to the hospital.  Signed: Dellia Nims, MD 10/02/2015, 12:14 PM   Pager: [phone]

## 2015-10-02 NOTE — Discharge Instructions (Signed)
-   You were hospitalized due to complications from 2 abscesses in your abdomen. The Radiologists placed a drain in each one to drain out the infection. You have been prescribed two antibiotics, Omnicef (Cefdinir) and Flagyl (Metronidazole) and you will need to take them until 10/14/2015. You may eat your normal foods. Try to avoid heavy lifting or exercise while the drains are in place. Make sure you avoid consuming any alcohol while you are taking these antibiotics. If you have severe pain, worsening fever, your drains come out on their own, or large amounts of blood or pus from the drains, return to the hospital.  Information on my medicine - XARELTO (Rivaroxaban)  This medication education was reviewed with me or my healthcare representative as part of my discharge preparation.  The pharmacist that spoke with me during my hospital stay was:  Brain Hilts, [facility]  Why was Xarelto prescribed for you? Xarelto was prescribed for you to reduce the risk of a blood clot forming that can cause a stroke if you have a medical condition called atrial fibrillation (a type of irregular heartbeat).  What do you need to know about xarelto ? Take your Xarelto ONCE DAILY at the same time every day with your evening meal. If you have difficulty swallowing the tablet whole, you may crush it and mix in applesauce just prior to taking your dose.  Take Xarelto exactly as prescribed by your doctor and DO NOT stop taking Xarelto without talking to the doctor who prescribed the medication.  Stopping without other stroke prevention medication to take the place of Xarelto may increase your risk of developing a clot that causes a stroke.  Refill your prescription before you run out.  After discharge, you should have regular check-up appointments with your healthcare provider that is prescribing your Xarelto.  In the future your dose may need to be changed if your kidney function or weight changes by a  significant amount.  What do you do if you miss a dose? If you are taking Xarelto ONCE DAILY and you miss a dose, take it as soon as you remember on the same day then continue your regularly scheduled once daily regimen the next day. Do not take two doses of Xarelto at the same time or on the same day.   Important Safety Information A possible side effect of Xarelto is bleeding. You should call your healthcare provider right away if you experience any of the following: ? Bleeding from an injury or your nose that does not stop. ? Unusual colored urine (red or dark brown) or unusual colored stools (red or black). ? Unusual bruising for unknown reasons. ? A serious fall or if you hit your head (even if there is no bleeding).  Some medicines may interact with Xarelto and might increase your risk of bleeding while on Xarelto. To help avoid this, consult your healthcare provider or pharmacist prior to using any new prescription or non-prescription medications, including herbals, vitamins, non-steroidal anti-inflammatory drugs (NSAIDs) and supplements.  This website has more information on Xarelto: [email or web address]

## 2015-10-02 NOTE — Progress Notes (Addendum)
S: No acute changes. No pain. Eating well. Wants to leave.   Vitals, labs, intake/output, and orders reviewed at this time. AM labs pending.   Gen: A&Ox3, no distress  H&N: EOMI, atraumatic, neck supple Chest: unlabored respirations, RRR Abd: soft, nontender, nondistended; LLQ colostomy productive. Drain x 2 with purulent output, 60cc recorded total.  Ext: warm, no edema Neuro: grossly normal  Lines/tubes/drains: PIV, LLQ colostomy, JP x 2 Micro: drains w mixed GPC/GNR speciation pending; C. Diff positive- colonized?  A/P:  50yo man with recurrent deep abscess s/p percutaneous drainage. Sepsis resolving- WBC downtrending, afebrile. Continue drains and drain care. Continue IV abx awaiting cultures. Gastrograffin enema at some point to assess the rectal stump. He states he has surgery scheduled with Dr. Marcello Moores on the 25th- this is inaccurate, he does have an office appointment with her on that day and he should keep this appointment if he is able- I have let her know he is inpatient. Will continue to follow.    Romana Juniper, MD [facility] Surgery, [place] Pager [phone]

## 2015-10-03 LAB — CULTURE, BLOOD (ROUTINE X 2)
CULTURE: NO GROWTH
Culture: NO GROWTH

## 2015-10-05 ENCOUNTER — Telehealth: Payer: Self-pay | Admitting: Student-PharmD

## 2015-10-05 ENCOUNTER — Encounter: Payer: Self-pay | Admitting: Internal Medicine

## 2015-10-05 LAB — AEROBIC/ANAEROBIC CULTURE W GRAM STAIN (SURGICAL/DEEP WOUND)

## 2015-10-05 LAB — AEROBIC/ANAEROBIC CULTURE (SURGICAL/DEEP WOUND): GRAM STAIN: NONE SEEN

## 2015-10-05 NOTE — Telephone Encounter (Signed)
Called pharmacy to obtain patient's fill history to assess compliance.     cefdinir (OMNICEF) 300 MG capsule Last filled 10/02/15, q. 24   digoxin (LANOXIN) 0.125 MG tablet Last filled 09/18/15, q. 30   diltiazem (CARDIZEM CD) 180 MG 24 hr capsule Last filled 06/22/15, q. 90   metoprolol (LOPRESSOR) 100 MG tablet Last filled 09/18/15, q. 60   metroNIDAZOLE (FLAGYL) 500 MG tablet Last filled 10/02/15, q 48   Multiple Vitamin (MULTIVITAMIN WITH MINERALS) TABS tablet    rivaroxaban (XARELTO) 20 MG TABS tablet Last filled 09/24/15, q. [place] PharmD Candidate, c/o 2019 10/05/2015 9:42 AM

## 2015-10-06 ENCOUNTER — Encounter: Payer: Self-pay | Admitting: Internal Medicine

## 2015-10-07 ENCOUNTER — Other Ambulatory Visit: Payer: Self-pay | Admitting: Surgery

## 2015-10-07 DIAGNOSIS — IMO0002 Reserved for concepts with insufficient information to code with codable children: Secondary | ICD-10-CM

## 2015-10-08 MED ORDER — PREDNISONE 50 MG PO TABS: ORAL_TABLET | ORAL | 0 refills | Status: DC

## 2015-10-08 NOTE — Progress Notes (Signed)
E-prescribed 13-hr prep [facility] [place].  Pharmacy hadn't received the order an hour later.  I will follow up with them tomorrow.  Will phone the prep in if e-prescribe doesn't work.  Brita Romp, RN

## 2015-10-09 NOTE — Progress Notes (Signed)
I phoned in a [facility] [place], [place] [phone]) after trying to use [facility]'s e-prescription without apparent success.  Kennard is to take Prednisone 50mg  on 10/14/15 at 2030, on 9/28 at 0230 and again on 9/28 at 0830.  Also, at 0830 on 9/28 he is to take Benadryl 50mg  PO.  Brita Romp, RN

## 2015-10-12 DIAGNOSIS — K651 Peritoneal abscess: Secondary | ICD-10-CM | POA: Diagnosis not present

## 2015-10-15 ENCOUNTER — Ambulatory Visit
Admission: RE | Admit: 2015-10-15 | Discharge: 2015-10-15 | Disposition: A | Discharge status: Home / Self Care | Payer: BLUE CROSS/BLUE SHIELD | Source: Ambulatory Visit | Attending: Surgery | Admitting: Surgery

## 2015-10-15 ENCOUNTER — Other Ambulatory Visit: Payer: Self-pay

## 2015-10-15 ENCOUNTER — Other Ambulatory Visit: Payer: Self-pay | Admitting: Surgery

## 2015-10-15 ENCOUNTER — Ambulatory Visit
Admission: RE | Admit: 2015-10-15 | Discharge: 2015-10-15 | Disposition: A | Discharge status: Home / Self Care | Payer: BLUE CROSS/BLUE SHIELD | Source: Ambulatory Visit | Attending: General Surgery | Admitting: General Surgery

## 2015-10-15 DIAGNOSIS — IMO0002 Reserved for concepts with insufficient information to code with codable children: Secondary | ICD-10-CM

## 2015-10-15 DIAGNOSIS — K439 Ventral hernia without obstruction or gangrene: Secondary | ICD-10-CM | POA: Diagnosis not present

## 2015-10-15 DIAGNOSIS — S91109D Unspecified open wound of unspecified toe(s) without damage to nail, subsequent encounter: Secondary | ICD-10-CM | POA: Diagnosis not present

## 2015-10-15 HISTORY — PX: IR GENERIC HISTORICAL: IMG1180011

## 2015-10-15 MED ORDER — IOPAMIDOL (ISOVUE-300) INJECTION 61%
125.0000 mL | Freq: Once | INTRAVENOUS | Status: AC | PRN
  Administered 2015-10-15: 125 mL via INTRAVENOUS

## 2015-10-15 NOTE — Progress Notes (Signed)
Patient ID: William Fischer, male   DOB: 1965/11/13, 50 y.o.   MRN: [id number]   Referring Physician(s): Geryl Councilman  Chief Complaint: The patient is seen in follow up today s/p multiple drain placements  History of present illness:  William Fischer is a 50yo male with a complex PMH.  He underwent a partial colectomy last year for ischemia colitis.  He has since had multiple drains placed for intraabdominal abscesses as well as subcutaneous LLQ abscesses.  Most recently on 09-30-15, he had a left psoas drain placed and a new LLQ subcutaneous drain placed.  He returns to clinic today for a repeat CT scan with rectal contrast as well as drain checks.  Unfortunately his LLQ drain got caught on his door and pulled out just 2 days ago.  He has not had any drainage from this site since that time.  His psoas drain has not put any contents out in the last week.  He has not flushed it during this time period.  He just finished antibiotic therapy this week.  He denies abdominal pain, fevers, or any other symptoms.  He did see Dr. Leighton Ruff this week from [facility] to discuss further plans of care.  Past Medical History:  Diagnosis Date  . Allergy to IVP dye   . Atrial fibrillation ([place])    admx 11/13 with acute sCHF in setting of RVR  => a. failed DCCV x 2; b. Pradaxa started;  c. failed sotalol  . Cardiomyopathy ([place])    likely tachy mediated in setting of AF with RVR  . Chicken pox as a child  . Chronic anticoagulation    Pradaxa  . Chronic systolic heart failure ([place])    a. echo 11/13: Ef 40-45%, diff HK, mod MR, mod LAE, mild RVE, mod RAE, small effusion;   b. TEE 11/13:  EF 35-40%, no LAA clot; Echo 2/14 shows normal EF  . Depression with anxiety 06/29/2011  . Migraine 06/29/2011  . Obesity 06/29/2011  . Snoring    patient needs sleep study - has declined    Past Surgical History:  Procedure Laterality Date  . CARDIOVERSION  11/30/2011   Procedure: CARDIOVERSION;  Surgeon:  Thayer Headings, MD;  Location: [facility];  Service: Cardiovascular;  Laterality: N/A;  . CARDIOVERSION  12/03/2011   Procedure: CARDIOVERSION;  Surgeon: Jolaine Artist, MD;  Location: [facility];  Service: Cardiovascular;  Laterality: N/A;  . LAPAROTOMY N/A 01/17/2015   Procedure: EXPLORATORY LAPAROTOMY WITH LEFT COLECTOMY AND COLOSTOMY;  Surgeon: Rolm Bookbinder, MD;  Location: [facility];  Service: General;  Laterality: N/A;  . TEE WITHOUT CARDIOVERSION  11/30/2011   Procedure: TRANSESOPHAGEAL ECHOCARDIOGRAM (TEE);  Surgeon: Thayer Headings, MD;  Location[facility] ENDOSCOPY;  Service: Cardiovascular;  Laterality: N/A;    Allergies: Contrast media [iodinated diagnostic agents]  Medications: Prior to Admission medications   Medication Sig Start Date End Date Taking? Authorizing Provider  cefdinir (OMNICEF) 300 MG capsule Take 1 capsule (300 mg total) by mouth 2 (two) times daily. 10/02/15   Tasrif Ahmed, MD  digoxin (LANOXIN) 0.125 MG tablet Take 1 tablet (0.125 mg total) by mouth daily. 07/29/15   [name], MD  diltiazem (CARDIZEM CD) 180 MG 24 hr capsule Take 1 capsule (180 mg total) by mouth 2 (two) times daily. 09/23/15   Thompson Grayer, MD  metoprolol (LOPRESSOR) 100 MG tablet Take 1 tablet (100 mg total) by mouth 2 (two) times daily. 07/29/15   [name], MD  metroNIDAZOLE (  FLAGYL) 500 MG tablet Take 1 tablet (500 mg total) by mouth 4 (four) times daily. 10/02/15   Dellia Nims, MD  Multiple Vitamin (MULTIVITAMIN WITH MINERALS) TABS tablet Take 1 tablet by mouth daily.    Historical Provider, MD  predniSONE (DELTASONE) 50 MG tablet Take one tablet 9/27 at 8:30pm, take one tablet 9/28 at 2:30am, take the final tablet 9/28 at 8:30am.  Also at 8:30am on 9:28, please take Benadryl (Diphenhydramine) 50mg  by mouth. 10/08/15   Jacqulynn Cadet, MD  rivaroxaban (XARELTO) 20 MG TABS tablet Take 1 tablet (20 mg total) by mouth daily with supper. 09/23/15   Thompson Grayer, MD     Family History  Problem  Relation Age of Onset  . Hypertension Mother   . Hypertension Father   . Aneurysm Father   . Alcohol abuse Father   . Cancer Maternal Grandmother     brain  . Diabetes Maternal Grandfather     type 2  . Parkinsonism Maternal Grandfather   . Cancer Paternal Grandmother     breast  . Diabetes Paternal Grandmother   . Alcohol abuse Paternal Grandfather     Social History   Social History  . Marital status: Divorced    Spouse name: N/A  . Number of children: N/A  . Years of education: N/A   Social History Main Topics  . Smoking status: Former Smoker    Packs/day: 1.00    Years: 20.00    Types: Cigarettes    Quit date: 07/10/2014  . Smokeless tobacco: Never Used  . Alcohol use No  . Drug use: No  . Sexual activity: No   Other Topics Concern  . Not on file   Social History Narrative  . No narrative on file     Vital Signs: BP (!) 111/58 (BP Location: Left Arm, Patient Position: Sitting, Cuff Size: Large)   Pulse 82   Temp 98.2 F (36.8 C) (Oral)   Resp 15   SpO2 98%   Physical Exam  Gen: obese, pleasant, white male in NAD Heart: a.fib, rate controlled Lungs: CTAB Abd: soft, NT, RUQ colostomy in place.  Incisional/ventral hernia noted, this is large.  LLQ drain site is healed over with no erythema or leakage currently.  His left psoas/flank drain has about 2cc of brownish colored output that has been present for the last week.  Drain site is c/d/i  Imaging: No results found.  Labs:  CBC:  Recent Labs  09/28/15 1940 09/30/15 0332 10/01/15 0712 10/02/15 0638  WBC 17.1* 18.9* 11.5* 14.1*  HGB 14.3 13.3 13.6 13.2  HCT 44.6 41.5 42.6 41.1  PLT 648* 585* 549* 582*    COAGS:  Recent Labs  04/13/15 1730  08/10/15 1638 08/31/15 1631 09/29/15 0138 09/30/15 0833  INR 2.62*  < > 1.7 1.8 2.08 1.32  APTT 49*  --   --   --   --   --   < > = values in this interval not displayed.  BMP:  Recent Labs  09/28/15 1940 09/30/15 0332 10/01/15 0712  10/02/15 0638  NA 134* 134* 139 137  K 4.7 4.4 4.2 4.0  CL 97* 100* 103 101  CO2 29 26 28 29   GLUCOSE 147* 261* 135* 117*  BUN 10 13 15 9   CALCIUM 9.1 8.9 8.8* 9.1  CREATININE 0.96 0.78 0.89 0.84  GFRNONAA >60 >60 >60 >60  GFRAA >60 >60 >60 >60    LIVER FUNCTION TESTS:  Recent Labs  07/25/15 1441 07/26/15  [id number] 09/28/15 1940 10/01/15 0712  BILITOT 0.5 0.7 0.6 0.6  AST 18 13* 24 17  ALT 22 17 48 37  ALKPHOS 88 74 94 68  PROT 8.4* 7.0 7.6 6.2*  ALBUMIN 3.3* 2.6* 2.7* 2.5*    Assessment:  1. S/p partial colectomy with multiple intra-abdominal abscesses   The patient's CT scan today with rectal contrast confirms a leak of his rectal stump.  He has contrast that appears to form fistulous tracts to his left psoas area as well as the LLQ area where his previous drain was in place.  Since the LLQ drain has come out and the site is healed over, we will not replace his drain.  This abscess that was present has resolved as of now.  The abscess in the left psoas has also resolved, but due to the tract that is present, we will leave the drain in place to avoid a recurrence of this abscess.  Dr. Laurence Ferrari is going to contact Dr. Marcello Moores to discuss the CT scan and further plans.  For now, we will hold off on the patient returning to our clinic until further plans are made with Dr. Marcello Moores.  If no surgery is planned, then we will need to see the patient in around 2-4 weeks for a drain check.  Signed: Henreitta Cea 10/15/2015, 10:55 AM   Please refer to Dr. Katrinka Blazing attestation of this note for management and plan.

## 2015-10-16 ENCOUNTER — Encounter: Payer: Self-pay | Admitting: Diagnostic Radiology

## 2015-10-22 ENCOUNTER — Telehealth: Payer: Self-pay | Admitting: Internal Medicine

## 2015-10-22 NOTE — Telephone Encounter (Signed)
[facility] Clearance re-faxed to [phone]

## 2015-10-22 NOTE — Telephone Encounter (Signed)
New message      Request for surgical clearance:  What type of surgery is being performed?  Hernia repair and colostomy reversal 1. When is this surgery scheduled? Pending clearance  Are there any medications that need to be held prior to surgery and how long? Hold xarelto instructions and cardiac clearance Name of physician performing surgery?  Dr Marcello Moores and Dr Donne Hazel What is your office phone and fax number? Fax [phone] They received office notes from 09-23-15 but it did not say patient was cleared

## 2015-10-22 NOTE — Telephone Encounter (Signed)
Per Dr Rayann Heman proceed if medically indicated.  Would need Lovenox bridging while off Coumadin

## 2015-10-23 ENCOUNTER — Encounter (HOSPITAL_COMMUNITY): Payer: Self-pay

## 2015-10-23 ENCOUNTER — Emergency Department (HOSPITAL_COMMUNITY): Payer: BLUE CROSS/BLUE SHIELD

## 2015-10-23 ENCOUNTER — Telehealth: Payer: Self-pay | Admitting: Internal Medicine

## 2015-10-23 ENCOUNTER — Emergency Department (HOSPITAL_COMMUNITY)
Admission: EM | Admit: 2015-10-23 | Discharge: 2015-10-23 | Disposition: A | Discharge status: Home / Self Care | Payer: BLUE CROSS/BLUE SHIELD | Attending: Emergency Medicine | Admitting: Emergency Medicine

## 2015-10-23 DIAGNOSIS — L988 Other specified disorders of the skin and subcutaneous tissue: Secondary | ICD-10-CM | POA: Diagnosis not present

## 2015-10-23 DIAGNOSIS — R1030 Lower abdominal pain, unspecified: Secondary | ICD-10-CM | POA: Diagnosis not present

## 2015-10-23 DIAGNOSIS — K632 Fistula of intestine: Secondary | ICD-10-CM | POA: Insufficient documentation

## 2015-10-23 DIAGNOSIS — I5022 Chronic systolic (congestive) heart failure: Secondary | ICD-10-CM | POA: Diagnosis not present

## 2015-10-23 DIAGNOSIS — R1032 Left lower quadrant pain: Secondary | ICD-10-CM | POA: Diagnosis not present

## 2015-10-23 DIAGNOSIS — Z7901 Long term (current) use of anticoagulants: Secondary | ICD-10-CM | POA: Diagnosis not present

## 2015-10-23 DIAGNOSIS — Z87891 Personal history of nicotine dependence: Secondary | ICD-10-CM | POA: Diagnosis not present

## 2015-10-23 LAB — COMPREHENSIVE METABOLIC PANEL
ALT: 18 U/L (ref 17–63)
AST: 15 U/L (ref 15–41)
Albumin: 3.6 g/dL (ref 3.5–5.0)
Alkaline Phosphatase: 57 U/L (ref 38–126)
Anion gap: 9 (ref 5–15)
BUN: 9 mg/dL (ref 6–20)
CALCIUM: 9.1 mg/dL (ref 8.9–10.3)
CHLORIDE: 100 mmol/L — AB (ref 101–111)
CO2: 27 mmol/L (ref 22–32)
CREATININE: 0.86 mg/dL (ref 0.61–1.24)
Glucose, Bld: 131 mg/dL — ABNORMAL HIGH (ref 65–99)
POTASSIUM: 4.5 mmol/L (ref 3.5–5.1)
Sodium: 136 mmol/L (ref 135–145)
Total Bilirubin: 0.9 mg/dL (ref 0.3–1.2)
Total Protein: 7.2 g/dL (ref 6.5–8.1)

## 2015-10-23 LAB — CBC WITH DIFFERENTIAL/PLATELET
Basophils Absolute: 0 10*3/uL (ref 0.0–0.1)
Basophils Relative: 0 %
EOS ABS: 0.2 10*3/uL (ref 0.0–0.7)
EOS PCT: 2 %
HCT: 46.2 % (ref 39.0–52.0)
Hemoglobin: 14.8 g/dL (ref 13.0–17.0)
LYMPHS ABS: 1.7 10*3/uL (ref 0.7–4.0)
Lymphocytes Relative: 13 %
MCH: 30.5 pg (ref 26.0–34.0)
MCHC: 32 g/dL (ref 30.0–36.0)
MCV: 95.1 fL (ref 78.0–100.0)
MONO ABS: 1.6 10*3/uL — AB (ref 0.1–1.0)
MONOS PCT: 13 %
Neutro Abs: 9.5 10*3/uL — ABNORMAL HIGH (ref 1.7–7.7)
Neutrophils Relative %: 72 %
PLATELETS: 258 10*3/uL (ref 150–400)
RBC: 4.86 MIL/uL (ref 4.22–5.81)
RDW: 17.6 % — ABNORMAL HIGH (ref 11.5–15.5)
WBC: 13.1 10*3/uL — ABNORMAL HIGH (ref 4.0–10.5)

## 2015-10-23 LAB — LIPASE, BLOOD: Lipase: 25 U/L (ref 11–51)

## 2015-10-23 LAB — I-STAT CG4 LACTIC ACID, ED: LACTIC ACID, VENOUS: 1.18 mmol/L (ref 0.5–1.9)

## 2015-10-23 MED ORDER — FENTANYL CITRATE (PF) 100 MCG/2ML IJ SOLN
50.0000 ug | INTRAMUSCULAR | Status: AC | PRN
  Administered 2015-10-23 (×3): 50 ug via INTRAVENOUS
  Filled 2015-10-23 (×3): qty 2

## 2015-10-23 MED ORDER — IOPAMIDOL (ISOVUE-300) INJECTION 61%
INTRAVENOUS | Status: AC
  Filled 2015-10-23: qty 100

## 2015-10-23 MED ORDER — OXYCODONE HCL 5 MG PO TABS: 5.0000 mg | ORAL_TABLET | ORAL | 0 refills | Status: DC | PRN

## 2015-10-23 MED ORDER — ONDANSETRON HCL 4 MG/2ML IJ SOLN: 4.0000 mg | INTRAMUSCULAR | Status: DC | PRN

## 2015-10-23 MED ORDER — DIPHENHYDRAMINE HCL 50 MG/ML IJ SOLN
INTRAMUSCULAR | Status: AC
  Filled 2015-10-23: qty 1

## 2015-10-23 MED ORDER — METHYLPREDNISOLONE SODIUM SUCC 125 MG IJ SOLR
125.0000 mg | Freq: Once | INTRAMUSCULAR | Status: AC
  Administered 2015-10-23: 125 mg via INTRAVENOUS
  Filled 2015-10-23: qty 2

## 2015-10-23 MED ORDER — SODIUM CHLORIDE 0.9 % IV BOLUS (SEPSIS)
1000.0000 mL | Freq: Once | INTRAVENOUS | Status: AC
  Administered 2015-10-23: 1000 mL via INTRAVENOUS

## 2015-10-23 MED ORDER — DIPHENHYDRAMINE HCL 50 MG/ML IJ SOLN
50.0000 mg | Freq: Once | INTRAMUSCULAR | Status: AC
  Administered 2015-10-23: 50 mg via INTRAVENOUS
  Filled 2015-10-23: qty 1

## 2015-10-23 MED ORDER — DIPHENHYDRAMINE HCL 50 MG/ML IJ SOLN
25.0000 mg | Freq: Once | INTRAMUSCULAR | Status: AC
  Administered 2015-10-23: 25 mg via INTRAVENOUS

## 2015-10-23 MED ORDER — BARIUM SULFATE 2.1 % PO SUSP
ORAL | Status: AC
  Filled 2015-10-23: qty 2

## 2015-10-23 NOTE — Telephone Encounter (Signed)
Patient is not on Coumadin, he is on Xarelto.  Clearance has been sent.  Mother aware

## 2015-10-23 NOTE — ED Provider Notes (Signed)
[place] DEPT Provider Note   CSN: [id number] Arrival date & time: 10/23/15  1012     History   Chief Complaint Chief Complaint  Patient presents with  . Abdominal Pain    pt has a colostomy in January having pain to L side of abdomen to where the drainage is     HPI William Fischer is a 50 y.o. male.  The history is provided by the patient.  Abdominal Pain   This is a recurrent problem. The current episode started more than 2 days ago. The problem occurs constantly. The problem has been gradually worsening. The pain is associated with a previous surgery (multiple surgeries, JP drain in place on left side, colostomy on right side). The pain is located in the LLQ. The pain is moderate. Pertinent negatives include anorexia, fever, diarrhea, hematochezia, melena, vomiting and constipation. Nothing aggravates the symptoms. Nothing relieves the symptoms. Past workup includes CT scan and surgery. Past medical history comments: bowel perforation and multiple surgeries.    Past Medical History:  Diagnosis Date  . Allergy to IVP dye   . Atrial fibrillation ([place])    admx 11/13 with acute sCHF in setting of RVR  => a. failed DCCV x 2; b. Pradaxa started;  c. failed sotalol  . Cardiomyopathy ([place])    likely tachy mediated in setting of AF with RVR  . Chicken pox as a child  . Chronic anticoagulation    Pradaxa  . Chronic systolic heart failure ([place])    a. echo 11/13: Ef 40-45%, diff HK, mod MR, mod LAE, mild RVE, mod RAE, small effusion;   b. TEE 11/13:  EF 35-40%, no LAA clot; Echo 2/14 shows normal EF  . Depression with anxiety 06/29/2011  . Migraine 06/29/2011  . Obesity 06/29/2011  . Snoring    patient needs sleep study - has declined    Patient Active Problem List   Diagnosis Date Noted  . NICM (nonischemic cardiomyopathy) ([place]) 08/03/2015  . Enteritis due to Clostridium difficile 07/30/2015  . Chronic atrial fibrillation ([place])   . Sepsis ([place]) 07/25/2015  .  Anticoagulated on Coumadin 04/21/2015  . Prediabetes 04/16/2015  . Normocytic anemia 04/16/2015  . 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 Abdominal abscess ([place])   . Septic shock ([place]) 04/12/2015  . 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 Abscess of abdominal cavity ([place]) 02/22/2015  . Acute venous embolism and thrombosis of deep vessels of proximal lower extremity ([place]) [I82.4Y9] 02/11/2015  . Long term (current) use of anticoagulants [Z79.01] 02/11/2015  . Monitoring for long-term anticoagulant use 02/11/2015  . Intra-abdominal abscess ([place])   . Bowel perforation ([place])   . Pressure ulcer 01/26/2015  . Pleural effusion   . Perforated bowel ([place])   . Diverticulitis of colon with perforation 01/14/2015  . AKI (acute kidney injury) ([place])   . Chronic systolic heart failure ([place]) 12/08/2011  . Morbid obesity ([place]) 12/03/2011  . Depression with anxiety 06/29/2011  . Obesity 06/29/2011    Past Surgical History:  Procedure Laterality Date  . CARDIOVERSION  11/30/2011   Procedure: CARDIOVERSION;  Surgeon: Thayer Headings, MD;  Location: [place];  Service: Cardiovascular;  Laterality: N/A;  . CARDIOVERSION  12/03/2011   Procedure: CARDIOVERSION;  Surgeon: Jolaine Artist, MD;  Location: [place];  Service: Cardiovascular;  Laterality: N/A;  . IR GENERIC HISTORICAL  08/11/2015   IR RADIOLOGIST EVAL & MGMT 08/11/2015 Markus Daft, MD [facility] INTERV RAD  . LAPAROTOMY N/A  01/17/2015   Procedure: EXPLORATORY LAPAROTOMY WITH LEFT COLECTOMY AND COLOSTOMY;  Surgeon: Rolm Bookbinder, MD;  Location: [place];  Service: General;  Laterality: N/A;  . TEE WITHOUT CARDIOVERSION  11/30/2011   Procedure: TRANSESOPHAGEAL ECHOCARDIOGRAM (TEE);  Surgeon: Thayer Headings, MD;  Location: [place];  Service: Cardiovascular;  Laterality: N/A;       Home Medications    Prior to Admission medications   Medication Sig Start Date End Date Taking? Authorizing Provider  cefdinir  (OMNICEF) 300 MG capsule Take 1 capsule (300 mg total) by mouth 2 (two) times daily. 10/02/15   Tasrif Ahmed, MD  digoxin (LANOXIN) 0.125 MG tablet Take 1 tablet (0.125 mg total) by mouth daily. 07/29/15   [name], MD  diltiazem (CARDIZEM CD) 180 MG 24 hr capsule Take 1 capsule (180 mg total) by mouth 2 (two) times daily. 09/23/15   Thompson Grayer, MD  metoprolol (LOPRESSOR) 100 MG tablet Take 1 tablet (100 mg total) by mouth 2 (two) times daily. 07/29/15   [name], MD  metroNIDAZOLE (FLAGYL) 500 MG tablet Take 1 tablet (500 mg total) by mouth 4 (four) times daily. 10/02/15   Dellia Nims, MD  Multiple Vitamin (MULTIVITAMIN WITH MINERALS) TABS tablet Take 1 tablet by mouth daily.    Historical Provider, MD  predniSONE (DELTASONE) 50 MG tablet Take one tablet 9/27 at 8:30pm, take one tablet 9/28 at 2:30am, take the final tablet 9/28 at 8:30am.  Also at 8:30am on 9:28, please take Benadryl (Diphenhydramine) 50mg  by mouth. 10/08/15   Jacqulynn Cadet, MD  rivaroxaban (XARELTO) 20 MG TABS tablet Take 1 tablet (20 mg total) by mouth daily with supper. 09/23/15   Thompson Grayer, MD    Family History Family History  Problem Relation Age of Onset  . Hypertension Mother   . Hypertension Father   . Aneurysm Father   . Alcohol abuse Father   . Cancer Maternal Grandmother     brain  . Diabetes Maternal Grandfather     type 2  . Parkinsonism Maternal Grandfather   . Cancer Paternal Grandmother     breast  . Diabetes Paternal Grandmother   . Alcohol abuse Paternal Grandfather     Social History Social History  Substance Use Topics  . Smoking status: Former Smoker    Packs/day: 1.00    Years: 20.00    Types: Cigarettes    Quit date: 07/10/2014  . Smokeless tobacco: Never Used  . Alcohol use No     Allergies   Contrast media [iodinated diagnostic agents]   Review of Systems Review of Systems  Constitutional: Negative for fever.  Gastrointestinal: Positive for abdominal pain. Negative for  anorexia, constipation, diarrhea, hematochezia, melena and vomiting.  All other systems reviewed and are negative.    Physical Exam Updated Vital Signs BP 97/69 (BP Location: Right Arm)   Pulse 62   Temp 97.7 F (36.5 C) (Oral)   Resp 18   Ht 5\' 11"  (1.803 m)   Wt 247 lb (112 kg)   SpO2 96%   BMI 34.45 kg/m   Physical Exam  Constitutional: He is oriented to person, place, and time. He appears well-developed and well-nourished. No distress.  HENT:  Head: Normocephalic and atraumatic.  Nose: Nose normal.  Eyes: Conjunctivae are normal.  Neck: Neck supple. No tracheal deviation present.  Cardiovascular: Normal rate and regular rhythm.   Pulmonary/Chest: Effort normal. No respiratory distress.  Abdominal: Soft. He exhibits no distension.  Mild tenderness to left lower quadrant without  guarding, rebound, rigidity. JP drain appears to be draining appropriately from the left flank, site of prior drain with mild fluctuance but no induration, erythema, or warmth. Ostomy site on right side of abdomen is clean dry and intact. Well-healed midline surgical wound  Neurological: He is alert and oriented to person, place, and time.  Skin: Skin is warm and dry.  Psychiatric: He has a normal mood and affect.     ED Treatments / Results  Labs (all labs ordered are listed, but only abnormal results are displayed) Labs Reviewed - No data to display  EKG  EKG Interpretation None       Radiology Ct Abdomen Pelvis W Contrast  Result Date: 10/23/2015 CLINICAL DATA:  Initial evaluation for left lower quadrant pain. Patient with history of colostomy with left-sided drainage catheter in place. EXAM: CT ABDOMEN AND PELVIS WITH CONTRAST TECHNIQUE: Multidetector CT imaging of the abdomen and pelvis was performed using the standard protocol following bolus administration of intravenous contrast. CONTRAST:  100 cc of Isovue-300. COMPARISON:  Prior CT from 10/15/2015. FINDINGS: Lower chest:  Persistent small left pleural effusion with associated left lower lobe atelectasis. Visualized right lung base is clear. Visualized heart and distal esophagus within normal limits. Hepatobiliary: 11 mm hypodense lesion at the posterior right hepatic lobe again noted, likely a simple cyst. Liver otherwise unremarkable. Minimal layering hyperdensity within the gallbladder lumen likely reflects small stones. No biliary dilatation. Pancreas: Pancreas within normal limits. Spleen: Spleen within normal limits. Adrenals/Urinary Tract: Adrenal glands within normal limits. Kidneys equal in size with symmetric enhancement. No nephrolithiasis, hydronephrosis, or focal enhancing renal mass. Ureters of normal caliber and appearance. Bladder within normal limits. Stomach/Bowel: Surgical changes from prior left hemicolectomy again seen with Hartmann's pouch and right lower quadrant colostomy. Although no contrast material has been instilled via the Hartmann's pouch on this exam, a persistent collection at the distal aspect of the stump again seen measuring approximately 2.0 x 2.5 cm on today's study, similar to previous. Multiple loops of bowel again seen closely approximated to this collection, likely related to underlying adhesive disease. Collection again seen to course superiorly, subsequently bifurcating within 1 tract coursing laterally along the transverse abdominis musculature, with the other tract coursing superiorly along the left pericolic gutter towards the gastrosplenic ligament and subdiaphragmatic space, and a containing inferiorly along the posterior para renal fascia just anterior to the left psoas musculature. This is similar to previous. A left retroperitoneal drainage catheter remains and in stable position without significant residual collection seen at its tip. An additional collection extending laterally from the loculated collection at the distal stump of the [facility] pouch extends just laterally through the  rectus abdominus musculature through the abdominal wall and overlying subcutaneous fat to the skin (series 2, image 68). Fluid density seen along this tract, increased from most recent CT. The subcutaneous portion of this collection measures approximately 2.5 by 7.0 cm. Vascular/Lymphatic: Normal intravascular enhancement seen throughout the intra-abdominal aorta and its branch vessels. No new adenopathy. Reproductive: Prostate unremarkable. Other: Multiple ventral hernias again noted, stable. No associated obstruction or inflammation. Musculoskeletal: No acute osseous abnormality. No worrisome lytic or blastic osseous lesions. IMPRESSION: 1. Persistent leak from the Hartmann's pouch stump. A fistulous tract extending laterally through the abdominal wall at the level of the left lower quadrant is increased in size from most recent CT from 10/15/2015. Additional tracts extending superiorly and into the left lateral abdominal wall are overall grossly similar relative to recent CT from 10/15/2015. No  undrained intra-abdominal or retroperitoneal abscess cavity identified. Left retroperitoneal drainage catheter remains in good position. 2. 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 3. Additional ventral abdominal hernia as containing fat and small bowel without associated obstruction or inflammation, stable. Electronically Signed   By: Jeannine Boga M.D.   On: 10/23/2015 17:08    Procedures Procedures (including critical care time)  Medications Ordered in ED Medications  ondansetron (ZOFRAN) injection 4 mg (not administered)  Barium Sulfate 2.1 % SUSP (not administered)  iopamidol (ISOVUE-300) 61 % injection (not administered)  sodium chloride 0.9 % bolus 1,000 mL (0 mLs Intravenous Stopped 10/23/15 1222)  fentaNYL (SUBLIMAZE) injection 50 mcg (50 mcg Intravenous Given 10/23/15 1618)  methylPREDNISolone sodium succinate (SOLU-MEDROL) 125 mg/2 mL injection  125 mg (125 mg Intravenous Given 10/23/15 1117)  diphenhydrAMINE (BENADRYL) injection 50 mg (50 mg Intravenous Given 10/23/15 1117)  diphenhydrAMINE (BENADRYL) injection 25 mg (25 mg Intravenous Given 10/23/15 1621)   Initial Impression / Assessment and Plan / ED Course  I have reviewed the triage vital signs and the nursing notes.  Pertinent labs & imaging results that were available during my care of the patient were reviewed by me and considered in my medical decision making (see chart for details).  Clinical Course    50 y.o. male presents with Worsening abdominal pain over the last 2-3 days after having a CT performed for a fluid collection in his left lower quadrant 8 days ago. Patient states worsening pain which is consistent with previous abscess formation. Labs with persistent elevated white blood cell count at 13 which is not worse than prior, left lower quadrant tenderness is present with drain in place that appears to be functioning appropriately.  Due to highly complex surgical history plan will be for repeat CT to determine any significant changes and decide if surgical consultation is emergently needed. Scan was delayed by several hours due to radiology technician refusal to deviate from protocol although patient has received multiple scans with one hour of premedication and had no reaction to dye.  CT with finding of enlargement of fistulous tract of left lower abdominal wall associated with area of patient's pain. I discussed CT findings in person with Dr. Hulen Skains who was able to see radiology read with recent comparison study. He states there is no immediate indication for surgical intervention. He recommended better pain control at home and outpatient follow-up or return to the emergency department with fever, increased pain, or other clinical worsening. I discussed this with the patient who is upset that he is not being operated on today for his [name] pouch leak but I explained that  this is essentially unchanged from the prior study and that he had no intra-abdominal abscess that required drainage emergently. Patient with elevated white blood cell count of 13 without fever, tachycardia, tachypnea or other signs of developing infection. This can be monitored on an outpatient basis with general surgery. Patient provided oxycodone for pain at home until able to follow-up with his primary care physician and surgery clinic.  Final Clinical Impressions(s) / ED Diagnoses   Final diagnoses:  Left lower quadrant pain  Fistula    New Prescriptions Discharge Medication List as of 10/23/2015  5:30 PM    START taking these medications   Details  oxyCODONE (ROXICODONE) 5 MG immediate release tablet Take 1 tablet (5 mg total) by mouth every 4 (four) hours as needed for severe pain., Starting Fri 10/23/2015, Print  Leo Grosser, MD 10/24/15 [phone]

## 2015-10-23 NOTE — ED Triage Notes (Signed)
Pt had a colostomy placed in January has some leaking around the site now and LLQ pain

## 2015-10-23 NOTE — ED Notes (Signed)
Patient transported to CT 

## 2015-10-23 NOTE — Telephone Encounter (Signed)
New message    Pts mother is calling to speak to [place] about the pt. Pt was sent by ambulance to [facility] for a infection. Pts mom states that they are waiting on a clearance for th pt to have surgery. Please call.

## 2015-10-26 ENCOUNTER — Telehealth: Payer: Self-pay

## 2015-10-28 ENCOUNTER — Telehealth: Payer: Self-pay | Admitting: Internal Medicine

## 2015-10-28 NOTE — Telephone Encounter (Signed)
New message      Request for surgical clearance:  What type of surgery is being performed? Open hernia repair and colostomy reversal 1. When is this surgery scheduled? Pending clearance   2. Are there any medications that need to be held prior to surgery and how long?  Received note from Dr Rayann Heman stating pt needs bridging.  Pt is on xarelto 20mg .  No instructions were given as to when/how to bridge.  Please fax instructions on bridging.  3. Name of physician performing surgery? Dr Marcello Moores and Dr Donne Hazel  4. What is your office phone and fax number? Fax [phone]

## 2015-10-28 NOTE — Telephone Encounter (Signed)
No bridging necessary for Xarelto.

## 2015-10-29 NOTE — Telephone Encounter (Signed)
We do not bridge with Xarelto. Called [place] with [facility] and she had not received updated instructions on how long to hold anticoagulation. Recommend that patient holds Xarelto for 24 hours prior to procedure. Clearance faxed to [phone].

## 2015-11-03 NOTE — Progress Notes (Signed)
Cardiology Office Note Date:  11/04/2015  Patient ID:  William Fischer, William Fischer 24-Nov-1965, MRN [id number] PCP:  Julious Oka, MD  Cardiologist:  None Electrophysiologist: Dr. Rayann Heman   Chief Complaint: planned f/u  History of Present Illness: William Fischer is a 50 y.o. male with history of longstanding persistent AFib hx of with mesenteric emboli in the environment of noncompliance with his [place],, NICM, obesity/snoring (patient has declined sleep study), depression/anxiety comes to the office today to be seen for Dr. Rayann Heman.  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 In the postoperative period he was complicated with Hartmann pouch dehiscence which led to his first intra-abdominal abscess. 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 Now apparently planned for hernia repair and colostomy reversal.  He is pending hernia repair and colostomy reversal, Dr. Rayann Heman already OK to proceed, will need lovenox bridging.   The patient states he saw the surgeon today, is not yet scheduled,pending this he told him he would want him off Xarelto 24 hours prior to surgery.  He reports recently feeling random, very quick/sharp fleeting pains sporadically left and right chest.  Last <second, not positional or exertional.  No SOB, no palpitations, is unaware of his AF, no dizziness, near syncope or syncope.  He is anxious and looking forward to getting the colostomy reversed.  He denies any bleeding or signs of bleeding, reports compliance with all of his medicines.  Past Medical History:  Diagnosis Date  . Allergy to IVP dye   .  Atrial fibrillation ([place])    admx 11/13 with acute sCHF in setting of RVR  => a. failed DCCV x 2; b. Pradaxa started;  c. failed sotalol  . Cardiomyopathy ([place])    likely tachy mediated in setting of AF with RVR  . Chicken pox as a child  . Chronic anticoagulation    Pradaxa  . Chronic systolic heart failure ([place])    a. echo 11/13: Ef 40-45%, diff HK, mod MR, mod LAE, mild RVE, mod RAE, small effusion;   b. TEE 11/13:  EF 35-40%, no LAA clot; Echo 2/14 shows normal EF  . Depression with anxiety 06/29/2011  . Migraine 06/29/2011  . Obesity 06/29/2011  . Snoring    patient needs sleep study - has declined    Past Surgical History:  Procedure Laterality Date  . CARDIOVERSION  11/30/2011   Procedure: CARDIOVERSION;  Surgeon: Thayer Headings, MD;  Location: [facility];  Service: Cardiovascular;  Laterality: N/A;  . CARDIOVERSION  12/03/2011   Procedure: CARDIOVERSION;  Surgeon: Jolaine Artist, MD;  Location: [place];  Service: Cardiovascular;  Laterality: N/A;  . IR GENERIC HISTORICAL  08/11/2015   IR RADIOLOGIST EVAL & MGMT 08/11/2015 Markus Daft, MD [facility] INTERV RAD  . LAPAROTOMY N/A 01/17/2015   Procedure: EXPLORATORY LAPAROTOMY WITH LEFT COLECTOMY AND COLOSTOMY;  Surgeon: Rolm Bookbinder, MD;  Location: [place];  Service: General;  Laterality: N/A;  . TEE WITHOUT CARDIOVERSION  11/30/2011   Procedure: TRANSESOPHAGEAL ECHOCARDIOGRAM (TEE);  Surgeon: Thayer Headings, MD;  Location: [facility] ENDOSCOPY;  Service: Cardiovascular;  Laterality: N/A;    Current Outpatient Prescriptions  Medication Sig Dispense Refill  . cefdinir (OMNICEF) 300 MG  capsule Take 1 capsule (300 mg total) by mouth 2 (two) times daily. (Patient not taking: Reported on 10/23/2015) 24 capsule 0  . digoxin (LANOXIN) 0.125 MG tablet Take 1 tablet (0.125 mg total) by mouth daily. 34 tablet 0  . diltiazem (CARDIZEM CD) 180 MG 24 hr capsule Take 1 capsule (180 mg total) by mouth 2 (two) times daily. 180 capsule 3  . furosemide  (LASIX) 40 MG tablet Take 40 mg by mouth daily.  10  . lisinopril (PRINIVIL,ZESTRIL) 5 MG tablet Take 5 mg by mouth daily.  10  . metoprolol (LOPRESSOR) 100 MG tablet Take 1 tablet (100 mg total) by mouth 2 (two) times daily. 68 tablet 0  . metroNIDAZOLE (FLAGYL) 500 MG tablet Take 1 tablet (500 mg total) by mouth 4 (four) times daily. (Patient not taking: Reported on 10/23/2015) 48 tablet 0  . Multiple Vitamin (MULTIVITAMIN WITH MINERALS) TABS tablet Take 1 tablet by mouth daily.    [name] oxyCODONE (ROXICODONE) 5 MG immediate release tablet Take 1 tablet (5 mg total) by mouth every 4 (four) hours as needed for severe pain. 20 tablet 0  . predniSONE (DELTASONE) 50 MG tablet Take one tablet 9/27 at 8:30pm, take one tablet 9/28 at 2:30am, take the final tablet 9/28 at 8:30am.  Also at 8:30am on 9:28, please take Benadryl (Diphenhydramine) 50mg  by mouth. (Patient not taking: Reported on 10/23/2015) 3 tablet 0  . rivaroxaban (XARELTO) 20 MG TABS tablet Take 1 tablet (20 mg total) by mouth daily with supper. 30 tablet 11   No current facility-administered medications for this visit.     Allergies:   Contrast media [iodinated diagnostic agents]   Social History:  The patient  reports that he quit smoking about 15 months ago. His smoking use included Cigarettes. He has a 20.00 pack-year smoking history. He has never used smokeless tobacco. He reports that he does not drink alcohol or use drugs.   Family History:  The patient's family history includes Alcohol abuse in his father and paternal grandfather; Aneurysm in his father; Cancer in his maternal grandmother and paternal grandmother; Diabetes in his maternal grandfather and paternal grandmother; Hypertension in his father and mother; Parkinsonism in his maternal grandfather.  ROS:  Please see the history of present illness.  All other systems are reviewed and otherwise negative.   PHYSICAL EXAM:  VS:  BP 94/72   Pulse 78   Ht 5\' 11"  (1.803 m)   Wt 273  lb (123.8 kg)   BMI 38.08 kg/m  BMI: Body mass index is 38.08 kg/m. Well nourished, well developed, in no acute distress  HEENT: normocephalic, atraumatic  Neck: no JVD, carotid bruits or masses Cardiac:  IRRR, no significant murmurs, no rubs, or gallops Lungs:  clear to auscultation bilaterally, no wheezing, rhonchi or rales  Abd: + colostomy MS: no deformity or atrophy Ext: no edema  Skin: warm and dry, no rash Neuro:  No gross deficits appreciated Psych: euthymic mood, full affect   EKG:  Done today reviewed by myself and Dr. Curt Bears, shows Afib, low voltage tracing, 65bpm, appears similar to old   01/16/15: TTE Study Conclusions - Left ventricle: EF hard to judge due to rapid afib. Poor image   quality abnormal septal motion The cavity size was normal. Wall   thickness was normal. Systolic function was mildly reduced. The   estimated ejection fraction was in the range of 45% to 50%. - Mitral valve: There was mild regurgitation. - Left atrium: The atrium was  moderately dilated. - Atrial septum: No defect or patent foramen ovale was identified. - Pericardium, extracardiac: Small lateral pericardial effusion - Impressions: Consider TEE if clinically indicated. ? lipomatous   hypertrophy of atrial septum cannot r/o mass in RA but suspect   this is off axis artifact. Impressions - Consider TEE if clinically indicated. ? lipomatous hypertrophy of   atrial septum cannot r/o mass in RA but suspect this is off axis   artifact.   Recent Labs: 01/26/2015: B Natriuretic Peptide 301.9 04/18/2015: Magnesium 2.0 10/23/2015: ALT 18; BUN 9; Creatinine, Ser 0.86; Hemoglobin 14.8; Platelets 258; Potassium 4.5; Sodium 136  01/17/2015: Triglycerides 156  09/29/15 Dig level <0.2  Estimated Creatinine Clearance: 140.7 mL/min (by C-G formula based on SCr of 0.86 mg/dL).   Wt Readings from Last 3 Encounters:  11/04/15 273 lb (123.8 kg)  10/23/15 247 lb (112 kg)  09/29/15 241 lb 2.9 oz (109.4  kg)     Other studies reviewed: Additional studies/records reviewed today include: summarized above  ASSESSMENT AND PLAN:   1. Persistent AFib     CHA2DS2Vasc is 3, on Xarelto     Patient reports complaince     rate control controlled  2. Mild NICM     fluid status stable by exam, no symptoms of fluid OL     On BB/ACE  3. CP     Atypical  Once the patient ha a date and is confirmed how many days he will need to be off Xarelto, he will let us know.   Disposition: F/u with Dr. Rayann Heman in 6 months, the patient has an appointment he thinks already scheduled and would like to keep it in December.  Current medicines are reviewed at length with the patient today.  The patient did not have any concerns regarding medicines.  Haywood Lasso, PA-C 11/04/2015 10:26 AM     [facility] [address] [place]  [place] [address] [phone] (office)  [phone] (fax)

## 2015-11-04 ENCOUNTER — Encounter: Payer: Self-pay | Admitting: Physician Assistant

## 2015-11-04 ENCOUNTER — Ambulatory Visit (INDEPENDENT_AMBULATORY_CARE_PROVIDER_SITE_OTHER): Payer: BLUE CROSS/BLUE SHIELD | Admitting: Physician Assistant

## 2015-11-04 ENCOUNTER — Encounter (INDEPENDENT_AMBULATORY_CARE_PROVIDER_SITE_OTHER): Payer: Self-pay

## 2015-11-04 VITALS — BP 94/72 | HR 78 | Ht 71.0 in | Wt 273.0 lb

## 2015-11-04 DIAGNOSIS — I420 Dilated cardiomyopathy: Secondary | ICD-10-CM

## 2015-11-04 DIAGNOSIS — I481 Persistent atrial fibrillation: Secondary | ICD-10-CM | POA: Diagnosis not present

## 2015-11-04 DIAGNOSIS — R0789 Other chest pain: Secondary | ICD-10-CM

## 2015-11-04 DIAGNOSIS — I4819 Other persistent atrial fibrillation: Secondary | ICD-10-CM

## 2015-11-04 NOTE — Patient Instructions (Addendum)
Medication Instructions:   Your physician recommends that you continue on your current medications as directed. Please refer to the Current Medication list given to you today.    If you need a refill on your cardiac medications before your next appointment, please call your pharmacy.  Labwork:  NONE ORDER TODAY   Testing/Procedures: NONE ORDER TODAY   Follow-Up: keep appointment as scheduled with dr allred   Any Other Special Instructions Will Be Listed Below (If Applicable).

## 2015-11-06 ENCOUNTER — Other Ambulatory Visit: Payer: Self-pay | Admitting: General Surgery

## 2015-11-15 ENCOUNTER — Other Ambulatory Visit (HOSPITAL_COMMUNITY): Payer: Self-pay | Admitting: Nurse Practitioner

## 2015-11-17 ENCOUNTER — Encounter (HOSPITAL_COMMUNITY): Payer: Self-pay | Admitting: Emergency Medicine

## 2015-11-17 ENCOUNTER — Emergency Department (HOSPITAL_COMMUNITY): Payer: BLUE CROSS/BLUE SHIELD

## 2015-11-17 ENCOUNTER — Inpatient Hospital Stay (HOSPITAL_COMMUNITY)
Admission: EM | Admit: 2015-11-17 | Discharge: 2015-11-20 | DRG: 871 | Disposition: A | Discharge status: Home / Self Care | Payer: BLUE CROSS/BLUE SHIELD | Attending: Internal Medicine | Admitting: Internal Medicine

## 2015-11-17 DIAGNOSIS — Z833 Family history of diabetes mellitus: Secondary | ICD-10-CM | POA: Diagnosis not present

## 2015-11-17 DIAGNOSIS — Z6837 Body mass index (BMI) 37.0-37.9, adult: Secondary | ICD-10-CM | POA: Diagnosis not present

## 2015-11-17 DIAGNOSIS — B965 Pseudomonas (aeruginosa) (mallei) (pseudomallei) as the cause of diseases classified elsewhere: Secondary | ICD-10-CM | POA: Diagnosis not present

## 2015-11-17 DIAGNOSIS — Z8249 Family history of ischemic heart disease and other diseases of the circulatory system: Secondary | ICD-10-CM

## 2015-11-17 DIAGNOSIS — K611 Rectal abscess: Secondary | ICD-10-CM | POA: Diagnosis not present

## 2015-11-17 DIAGNOSIS — R918 Other nonspecific abnormal finding of lung field: Secondary | ICD-10-CM | POA: Diagnosis not present

## 2015-11-17 DIAGNOSIS — I429 Cardiomyopathy, unspecified: Secondary | ICD-10-CM | POA: Diagnosis not present

## 2015-11-17 DIAGNOSIS — F4321 Adjustment disorder with depressed mood: Secondary | ICD-10-CM | POA: Diagnosis present

## 2015-11-17 DIAGNOSIS — Z79899 Other long term (current) drug therapy: Secondary | ICD-10-CM | POA: Diagnosis not present

## 2015-11-17 DIAGNOSIS — Z452 Encounter for adjustment and management of vascular access device: Secondary | ICD-10-CM | POA: Diagnosis not present

## 2015-11-17 DIAGNOSIS — Z7901 Long term (current) use of anticoagulants: Secondary | ICD-10-CM

## 2015-11-17 DIAGNOSIS — I4891 Unspecified atrial fibrillation: Secondary | ICD-10-CM | POA: Diagnosis present

## 2015-11-17 DIAGNOSIS — Z91041 Radiographic dye allergy status: Secondary | ICD-10-CM | POA: Diagnosis not present

## 2015-11-17 DIAGNOSIS — K6819 Other retroperitoneal abscess: Secondary | ICD-10-CM | POA: Diagnosis not present

## 2015-11-17 DIAGNOSIS — Z933 Colostomy status: Secondary | ICD-10-CM | POA: Diagnosis not present

## 2015-11-17 DIAGNOSIS — A0471 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, recurrent: Secondary | ICD-10-CM | POA: Diagnosis present

## 2015-11-17 DIAGNOSIS — A0472 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, not specified as recurrent: Secondary | ICD-10-CM | POA: Diagnosis not present

## 2015-11-17 DIAGNOSIS — G4700 Insomnia, unspecified: Secondary | ICD-10-CM | POA: Diagnosis present

## 2015-11-17 DIAGNOSIS — F419 Anxiety disorder, unspecified: Secondary | ICD-10-CM | POA: Diagnosis present

## 2015-11-17 DIAGNOSIS — Z23 Encounter for immunization: Secondary | ICD-10-CM | POA: Diagnosis not present

## 2015-11-17 DIAGNOSIS — K651 Peritoneal abscess: Secondary | ICD-10-CM | POA: Diagnosis present

## 2015-11-17 DIAGNOSIS — Z87891 Personal history of nicotine dependence: Secondary | ICD-10-CM | POA: Diagnosis not present

## 2015-11-17 DIAGNOSIS — Z808 Family history of malignant neoplasm of other organs or systems: Secondary | ICD-10-CM | POA: Diagnosis not present

## 2015-11-17 DIAGNOSIS — K439 Ventral hernia without obstruction or gangrene: Secondary | ICD-10-CM | POA: Diagnosis present

## 2015-11-17 DIAGNOSIS — E669 Obesity, unspecified: Secondary | ICD-10-CM | POA: Diagnosis not present

## 2015-11-17 DIAGNOSIS — Z9049 Acquired absence of other specified parts of digestive tract: Secondary | ICD-10-CM

## 2015-11-17 DIAGNOSIS — Z820 Family history of epilepsy and other diseases of the nervous system: Secondary | ICD-10-CM

## 2015-11-17 DIAGNOSIS — R1110 Vomiting, unspecified: Secondary | ICD-10-CM | POA: Diagnosis not present

## 2015-11-17 DIAGNOSIS — I428 Other cardiomyopathies: Secondary | ICD-10-CM

## 2015-11-17 DIAGNOSIS — A419 Sepsis, unspecified organism: Secondary | ICD-10-CM | POA: Diagnosis not present

## 2015-11-17 DIAGNOSIS — R6521 Severe sepsis with septic shock: Secondary | ICD-10-CM | POA: Diagnosis not present

## 2015-11-17 DIAGNOSIS — I110 Hypertensive heart disease with heart failure: Secondary | ICD-10-CM | POA: Diagnosis not present

## 2015-11-17 DIAGNOSIS — B9689 Other specified bacterial agents as the cause of diseases classified elsewhere: Secondary | ICD-10-CM | POA: Diagnosis not present

## 2015-11-17 DIAGNOSIS — B999 Unspecified infectious disease: Secondary | ICD-10-CM | POA: Diagnosis present

## 2015-11-17 DIAGNOSIS — I5022 Chronic systolic (congestive) heart failure: Secondary | ICD-10-CM | POA: Diagnosis not present

## 2015-11-17 DIAGNOSIS — Z811 Family history of alcohol abuse and dependence: Secondary | ICD-10-CM

## 2015-11-17 DIAGNOSIS — IMO0002 Reserved for concepts with insufficient information to code with codable children: Secondary | ICD-10-CM | POA: Diagnosis present

## 2015-11-17 LAB — I-STAT CHEM 8, ED
BUN: 22 mg/dL — ABNORMAL HIGH (ref 6–20)
CALCIUM ION: 1.11 mmol/L — AB (ref 1.15–1.40)
CHLORIDE: 97 mmol/L — AB (ref 101–111)
CREATININE: 1.2 mg/dL (ref 0.61–1.24)
GLUCOSE: 135 mg/dL — AB (ref 65–99)
HCT: 44 % (ref 39.0–52.0)
HEMOGLOBIN: 15 g/dL (ref 13.0–17.0)
POTASSIUM: 5 mmol/L (ref 3.5–5.1)
Sodium: 132 mmol/L — ABNORMAL LOW (ref 135–145)
TCO2: 25 mmol/L (ref 0–100)

## 2015-11-17 LAB — CBC WITH DIFFERENTIAL/PLATELET
Basophils Absolute: 0 10*3/uL (ref 0.0–0.1)
Basophils Relative: 0 %
EOS ABS: 0 10*3/uL (ref 0.0–0.7)
EOS PCT: 0 %
HCT: 42 % (ref 39.0–52.0)
Hemoglobin: 14.7 g/dL (ref 13.0–17.0)
LYMPHS ABS: 1 10*3/uL (ref 0.7–4.0)
LYMPHS PCT: 7 %
MCH: 31 pg (ref 26.0–34.0)
MCHC: 35 g/dL (ref 30.0–36.0)
MCV: 88.6 fL (ref 78.0–100.0)
MONO ABS: 1.6 10*3/uL — AB (ref 0.1–1.0)
MONOS PCT: 11 %
Neutro Abs: 11.5 10*3/uL — ABNORMAL HIGH (ref 1.7–7.7)
Neutrophils Relative %: 82 %
PLATELETS: 390 10*3/uL (ref 150–400)
RBC: 4.74 MIL/uL (ref 4.22–5.81)
RDW: 15.9 % — AB (ref 11.5–15.5)
WBC: 14.2 10*3/uL — AB (ref 4.0–10.5)

## 2015-11-17 LAB — COMPREHENSIVE METABOLIC PANEL
ALBUMIN: 2.6 g/dL — AB (ref 3.5–5.0)
ALK PHOS: 112 U/L (ref 38–126)
ALT: 34 U/L (ref 17–63)
ANION GAP: 11 (ref 5–15)
AST: 37 U/L (ref 15–41)
BUN: 18 mg/dL (ref 6–20)
CHLORIDE: 97 mmol/L — AB (ref 101–111)
CO2: 24 mmol/L (ref 22–32)
Calcium: 8.7 mg/dL — ABNORMAL LOW (ref 8.9–10.3)
Creatinine, Ser: 1.27 mg/dL — ABNORMAL HIGH (ref 0.61–1.24)
GFR calc non Af Amer: >60 mL/min (ref >60)
GLUCOSE: 109 mg/dL — AB (ref 65–99)
Potassium: 5.4 mmol/L — ABNORMAL HIGH (ref 3.5–5.1)
SODIUM: 132 mmol/L — AB (ref 135–145)
Total Bilirubin: 0.9 mg/dL (ref 0.3–1.2)
Total Protein: 7.2 g/dL (ref 6.5–8.1)

## 2015-11-17 LAB — URINALYSIS, ROUTINE W REFLEX MICROSCOPIC
Bilirubin Urine: NEGATIVE
GLUCOSE, UA: NEGATIVE mg/dL
KETONES UR: NEGATIVE mg/dL
LEUKOCYTES UA: NEGATIVE
Nitrite: NEGATIVE
PH: 5.5 (ref 5.0–8.0)
PROTEIN: 30 mg/dL — AB
Specific Gravity, Urine: 1.017 (ref 1.005–1.030)

## 2015-11-17 LAB — C DIFFICILE QUICK SCREEN W PCR REFLEX
C Diff antigen: POSITIVE — AB
C Diff interpretation: DETECTED
C Diff toxin: POSITIVE — AB

## 2015-11-17 LAB — URINE MICROSCOPIC-ADD ON

## 2015-11-17 LAB — DIGOXIN LEVEL: DIGOXIN LVL: 0.3 ng/mL — AB (ref 0.8–2.0)

## 2015-11-17 LAB — I-STAT CG4 LACTIC ACID, ED
LACTIC ACID, VENOUS: 1.37 mmol/L (ref 0.5–1.9)
Lactic Acid, Venous: 1.63 mmol/L (ref 0.5–1.9)

## 2015-11-17 MED ORDER — IOPAMIDOL (ISOVUE-300) INJECTION 61%
INTRAVENOUS | Status: AC
  Administered 2015-11-17: 100 mL
  Filled 2015-11-17: qty 100

## 2015-11-17 MED ORDER — ONDANSETRON HCL 4 MG/2ML IJ SOLN
4.0000 mg | Freq: Once | INTRAMUSCULAR | Status: AC
  Administered 2015-11-17: 4 mg via INTRAVENOUS
  Filled 2015-11-17: qty 2

## 2015-11-17 MED ORDER — PIPERACILLIN-TAZOBACTAM 3.375 G IVPB
3.3750 g | Freq: Three times a day (TID) | INTRAVENOUS | Status: DC
  Administered 2015-11-18 – 2015-11-20 (×9): 3.375 g via INTRAVENOUS
  Filled 2015-11-17 (×11): qty 50

## 2015-11-17 MED ORDER — DIPHENHYDRAMINE HCL 50 MG/ML IJ SOLN
50.0000 mg | Freq: Once | INTRAMUSCULAR | Status: AC
  Administered 2015-11-17: 50 mg via INTRAVENOUS
  Filled 2015-11-17: qty 1

## 2015-11-17 MED ORDER — HYDROMORPHONE HCL 2 MG/ML IJ SOLN
1.0000 mg | Freq: Once | INTRAMUSCULAR | Status: AC
  Administered 2015-11-17: 1 mg via INTRAVENOUS
  Filled 2015-11-17: qty 1

## 2015-11-17 MED ORDER — METHYLPREDNISOLONE SODIUM SUCC 125 MG IJ SOLR
125.0000 mg | Freq: Once | INTRAMUSCULAR | Status: AC
  Administered 2015-11-17: 125 mg via INTRAVENOUS
  Filled 2015-11-17: qty 2

## 2015-11-17 MED ORDER — ONDANSETRON HCL 4 MG/2ML IJ SOLN: 4.0000 mg | Freq: Four times a day (QID) | INTRAMUSCULAR | Status: DC | PRN

## 2015-11-17 MED ORDER — FUROSEMIDE 10 MG/ML IJ SOLN
20.0000 mg | Freq: Once | INTRAMUSCULAR | Status: DC
  Filled 2015-11-17: qty 2

## 2015-11-17 MED ORDER — ACETAMINOPHEN 325 MG PO TABS
650.0000 mg | ORAL_TABLET | Freq: Once | ORAL | Status: AC
  Administered 2015-11-17: 650 mg via ORAL
  Filled 2015-11-17: qty 2

## 2015-11-17 MED ORDER — PIPERACILLIN-TAZOBACTAM 3.375 G IVPB 30 MIN
3.3750 g | Freq: Once | INTRAVENOUS | Status: AC
  Administered 2015-11-17: 3.375 g via INTRAVENOUS
  Filled 2015-11-17: qty 50

## 2015-11-17 MED ORDER — ACETAMINOPHEN 325 MG PO TABS: 650.0000 mg | ORAL_TABLET | Freq: Four times a day (QID) | ORAL | Status: DC | PRN

## 2015-11-17 MED ORDER — ACETAMINOPHEN 650 MG RE SUPP: 650.0000 mg | Freq: Four times a day (QID) | RECTAL | Status: DC | PRN

## 2015-11-17 MED ORDER — FUROSEMIDE 40 MG PO TABS
40.0000 mg | ORAL_TABLET | Freq: Every day | ORAL | Status: DC
  Filled 2015-11-17: qty 1

## 2015-11-17 MED ORDER — SODIUM CHLORIDE 0.9 % IV BOLUS (SEPSIS)
1000.0000 mL | Freq: Once | INTRAVENOUS | Status: AC
  Administered 2015-11-17: 1000 mL via INTRAVENOUS

## 2015-11-17 MED ORDER — DIGOXIN 125 MCG PO TABS
0.1250 mg | ORAL_TABLET | Freq: Every day | ORAL | Status: DC
  Administered 2015-11-18 – 2015-11-20 (×3): 0.125 mg via ORAL
  Filled 2015-11-17 (×3): qty 1

## 2015-11-17 MED ORDER — ONDANSETRON HCL 4 MG PO TABS: 4.0000 mg | ORAL_TABLET | Freq: Four times a day (QID) | ORAL | Status: DC | PRN

## 2015-11-17 MED ORDER — PNEUMOCOCCAL VAC POLYVALENT 25 MCG/0.5ML IJ INJ
0.5000 mL | INJECTION | INTRAMUSCULAR | Status: DC
  Filled 2015-11-17: qty 0.5

## 2015-11-17 MED ORDER — ADULT MULTIVITAMIN W/MINERALS CH
1.0000 | ORAL_TABLET | Freq: Every day | ORAL | Status: DC
  Administered 2015-11-18 – 2015-11-20 (×3): 1 via ORAL
  Filled 2015-11-17 (×3): qty 1

## 2015-11-17 MED ORDER — LISINOPRIL 5 MG PO TABS: 5.0000 mg | ORAL_TABLET | Freq: Every day | ORAL | Status: DC

## 2015-11-17 MED ORDER — FENTANYL CITRATE (PF) 100 MCG/2ML IJ SOLN
50.0000 ug | INTRAMUSCULAR | Status: DC | PRN
  Administered 2015-11-17 – 2015-11-20 (×8): 50 ug via INTRAVENOUS
  Filled 2015-11-17 (×9): qty 2

## 2015-11-17 MED ORDER — INFLUENZA VAC SPLIT QUAD 0.5 ML IM SUSY: 0.5000 mL | PREFILLED_SYRINGE | INTRAMUSCULAR | Status: DC

## 2015-11-17 MED ORDER — METOPROLOL TARTRATE 100 MG PO TABS
100.0000 mg | ORAL_TABLET | Freq: Two times a day (BID) | ORAL | Status: DC
  Administered 2015-11-18 – 2015-11-20 (×5): 100 mg via ORAL
  Filled 2015-11-17 (×6): qty 1

## 2015-11-17 MED ORDER — DILTIAZEM HCL ER COATED BEADS 180 MG PO CP24
180.0000 mg | ORAL_CAPSULE | Freq: Two times a day (BID) | ORAL | Status: DC
  Administered 2015-11-18 – 2015-11-20 (×5): 180 mg via ORAL
  Filled 2015-11-17 (×6): qty 1

## 2015-11-17 NOTE — Progress Notes (Signed)
Pharmacy Antibiotic Note  William Fischer is a 50 y.o. male admitted on 11/17/2015 with [name].  Pharmacy has been consulted for Zosyn dosing for abdominal abscess. Hx emergent hemicolectomy with colostomy 01/17/15 for perforated ischemic colits. Has had multiple recurrent abdominal abscesses (7) and has 2 drains in place.     Zosyn 3/375 gm IV x 1 given at 1444 today in ED. Tmax 100.8, WBC 14.2. Blood and urine cultures sent. Hx C diff in July, specimen to be sent for C diff check.   9/18 aspirate left flank retroperitoneal abscess culture grew abundant Strep viridans and few Enterococcus faecalis, mixed with anaerobic flora.  Treated with Zosyn as inpatient (9/12-9/15), and sent home with Rx for Cefdinir and Flagyl to complete a total of 14 days antibiotics.  Also hx C diff in July.   Plan:  Zosyn 3.375 gm IV q8hrs (each over 4 hrs)  Good renal function, do not expect any need to modify regimen.  Follow culture data, progress.  Height: 5\' 11"  (180.3 cm) Weight: 273 lb 9.6 oz (124.1 kg) IBW/kg (Calculated) : 75.3  Temp (24hrs), Avg:98.9 F (37.2 C), Min:97.9 F (36.6 C), Max:100.8 F (38.2 C)   Recent Labs Lab 11/17/15 1201 11/17/15 1228 11/17/15 1329 11/17/15 1446 11/17/15 1506  WBC 14.2*  --   --   --   --   CREATININE  --   --  1.20 1.27*  --   LATICACIDVEN  --  1.63  --   --  1.37    Estimated Creatinine Clearance: 95.4 mL/min (by C-G formula based on SCr of 1.27 mg/dL (H)).    Allergies  Allergen Reactions  . Contrast Media [Iodinated Diagnostic Agents] Rash    a diffuse macular rash after CTA chest  Pt was premedicated with 125mg  IV Solumedrol, 50mg  IV Benadryl 1 hr prior to CTexam, and tolerated procedure without any difficulties. 11/28/11 Also same pre med protocol observed on 02/22/15 and pt tolerated the procedure well    Antimicrobials this admission:  Zosyn 10/31>>  Dose adjustments this admission:  n/a  Microbiology results:  10/31 urine -  10/31  blood x 2 -  10/31 C diff -    Prior admissions:       9/18 aspirate left flank retroperitoneal abscess - Abundant Strep viridans and few Enterococcus faecalis, mixed with anaerobic flora    07/26/15 C diff POSITIVE  Thank you for allowing pharmacy to be a part of this patient's care.  Arty Baumgartner, [place] Pager: [phone] 11/17/2015 10:02 PM

## 2015-11-17 NOTE — ED Provider Notes (Signed)
[facility] DEPT Provider Note   CSN: [id number] Arrival date & time: 11/17/15  1153     History   Chief Complaint Chief Complaint  Patient presents with  . Fever  . Abdominal Pain    HPI William Fischer is a 50 y.o. male.  HPI William Fischer is a 50 y.o. male with history of A. fib, cardiomyopathy, chronic anticoagulation, status post laparoscopic colectomy on 12/16 for ischemic colon, with recurrent intra abdominal abscesses, presents today with complaint of worsening abdominal pain, left flank pain, purulent drainage from JP drain sites, fever, chills. Pt with prior hospitalizations for the same. He was most recently hospitalized on 09/29/15 with L retroperitoneal abscess and left anterior abdominal wall abscess. Had JP drains placed in both sites. He was treated with antibiotics. 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 From the notes at that time I have read that most of the abscesses have resolved. It was planned to leave a drain in the left flank which connected to retroperitoneal abscess to prevent recurrence. Pt states symptoms have been worsening for the last 2 weeks. He states he has had fever the last several days up to 102. He now has swelling in the skin around the left flank drain, which extends to the back. States it is very tender. He reports nausea and vomiting. States passing pas from rectum.   Past Medical History:  Diagnosis Date  . Allergy to IVP dye   . Atrial fibrillation ([place])    admx 11/13 with acute sCHF in setting of RVR  => a. failed DCCV x 2; b. Pradaxa started;  c. failed sotalol  . Cardiomyopathy ([place])    likely tachy mediated in setting of AF with RVR  . Chicken pox as a child  . Chronic anticoagulation    Pradaxa  . Chronic systolic heart failure ([facility])    a. echo 11/13:  Ef 40-45%, diff HK, mod MR, mod LAE, mild RVE, mod RAE, small effusion;   b. TEE 11/13:  EF 35-40%, no LAA clot; Echo 2/14 shows normal EF  . Depression with anxiety 06/29/2011  . Migraine 06/29/2011  . Obesity 06/29/2011  . Snoring    patient needs sleep study - has declined    Patient Active Problem List   Diagnosis Date Noted  . NICM (nonischemic cardiomyopathy) ([place]) 08/03/2015  . Enteritis due to Clostridium difficile 07/30/2015  . Chronic atrial fibrillation ([place])   . Sepsis ([place]) 07/25/2015  . Anticoagulated on Coumadin 04/21/2015  . Prediabetes 04/16/2015  . Normocytic anemia 04/16/2015  . 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 Abdominal abscess ([place])   . Septic shock ([place]) 04/12/2015  . 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 Abscess of abdominal cavity ([place]) 02/22/2015  . Acute venous embolism and thrombosis of deep vessels of proximal lower extremity ([place]) [I82.4Y9] 02/11/2015  . Long term (current) use of anticoagulants [Z79.01] 02/11/2015  . Monitoring for long-term anticoagulant use 02/11/2015  . Intra-abdominal abscess ([place])   . Bowel perforation ([place])   . Pressure ulcer 01/26/2015  . Pleural effusion   . Perforated bowel ([place])   . Diverticulitis of colon with perforation 01/14/2015  . AKI (acute kidney injury) ([place])   . Chronic systolic heart failure ([place]) 12/08/2011  . Morbid obesity ([place]) 12/03/2011  . Depression with anxiety 06/29/2011  . Obesity 06/29/2011  Past Surgical History:  Procedure Laterality Date  . CARDIOVERSION  11/30/2011   Procedure: CARDIOVERSION;  Surgeon: Thayer Headings, MD;  Location: [facility];  Service: Cardiovascular;  Laterality: N/A;  . CARDIOVERSION  12/03/2011   Procedure: CARDIOVERSION;  Surgeon: Jolaine Artist, MD;  Location: [facility];  Service: Cardiovascular;  Laterality: N/A;  . IR GENERIC HISTORICAL  08/11/2015   IR RADIOLOGIST EVAL & MGMT 08/11/2015 Markus Daft, MD [facility] INTERV RAD  . LAPAROTOMY  N/A 01/17/2015   Procedure: EXPLORATORY LAPAROTOMY WITH LEFT COLECTOMY AND COLOSTOMY;  Surgeon: Rolm Bookbinder, MD;  Location: [facility];  Service: General;  Laterality: N/A;  . TEE WITHOUT CARDIOVERSION  11/30/2011   Procedure: TRANSESOPHAGEAL ECHOCARDIOGRAM (TEE);  Surgeon: Thayer Headings, MD;  Location: [facility];  Service: Cardiovascular;  Laterality: N/A;       Home Medications    Prior to Admission medications   Medication Sig Start Date End Date Taking? Authorizing Provider  digoxin (LANOXIN) 0.125 MG tablet Take 1 tablet (0.125 mg total) by mouth daily. 07/29/15  Yes [name], MD  diltiazem (CARDIZEM CD) 180 MG 24 hr capsule Take 1 capsule (180 mg total) by mouth 2 (two) times daily. 09/23/15  Yes Thompson Grayer, MD  furosemide (LASIX) 40 MG tablet Take 40 mg by mouth daily. 10/18/15  Yes Historical Provider, MD  lisinopril (PRINIVIL,ZESTRIL) 5 MG tablet Take 5 mg by mouth daily. 10/18/15  Yes Historical Provider, MD  metoprolol (LOPRESSOR) 100 MG tablet TAKE 1 TABLET (100 MG TOTAL) BY MOUTH 2 (TWO) TIMES DAILY. 11/16/15  Yes Sherran Needs, NP  Multiple Vitamin (MULTIVITAMIN WITH MINERALS) TABS tablet Take 1 tablet by mouth daily.   Yes Historical Provider, MD  rivaroxaban (XARELTO) 20 MG TABS tablet Take 1 tablet (20 mg total) by mouth daily with supper. 09/23/15  Yes Thompson Grayer, MD  cefdinir (OMNICEF) 300 MG capsule Take 1 capsule (300 mg total) by mouth 2 (two) times daily. Patient not taking: Reported on 11/17/2015 10/02/15   Dellia Nims, MD  metroNIDAZOLE (FLAGYL) 500 MG tablet Take 1 tablet (500 mg total) by mouth 4 (four) times daily. Patient not taking: Reported on 11/17/2015 10/02/15   Dellia Nims, MD  oxyCODONE (ROXICODONE) 5 MG immediate release tablet Take 1 tablet (5 mg total) by mouth every 4 (four) hours as needed for severe pain. Patient not taking: Reported on 11/17/2015 10/23/15   Leo Grosser, MD  predniSONE (DELTASONE) 50 MG tablet Take one tablet 9/27 at  8:30pm, take one tablet 9/28 at 2:30am, take the final tablet 9/28 at 8:30am.  Also at 8:30am on 9:28, please take Benadryl (Diphenhydramine) 50mg  by mouth. Patient not taking: Reported on 11/17/2015 10/08/15   Jacqulynn Cadet, MD    Family History Family History  Problem Relation Age of Onset  . Hypertension Mother   . Hypertension Father   . Aneurysm Father   . Alcohol abuse Father   . Cancer Maternal Grandmother     brain  . Diabetes Maternal Grandfather     type 2  . Parkinsonism Maternal Grandfather   . Cancer Paternal Grandmother     breast  . Diabetes Paternal Grandmother   . Alcohol abuse Paternal Grandfather     Social History Social History  Substance Use Topics  . Smoking status: Former Smoker    Packs/day: 1.00    Years: 20.00    Types: Cigarettes    Quit date: 07/10/2014  . Smokeless tobacco: Never Used  . Alcohol use No  Allergies   Contrast media [iodinated diagnostic agents]   Review of Systems Review of Systems  Constitutional: Positive for chills and fever.  Respiratory: Negative for cough, chest tightness and shortness of breath.   Cardiovascular: Negative for chest pain, palpitations and leg swelling.  Gastrointestinal: Positive for abdominal pain, nausea and vomiting. Negative for abdominal distention and diarrhea.  Genitourinary: Negative for dysuria, frequency, hematuria and urgency.  Musculoskeletal: Negative for arthralgias, myalgias, neck pain and neck stiffness.  Skin: Negative for rash.  Allergic/Immunologic: Negative for immunocompromised state.  Neurological: Negative for dizziness, weakness, light-headedness, numbness and headaches.     Physical Exam Updated Vital Signs BP (!) 151/128 (BP Location: Right Arm)   Pulse 92   Temp 100.8 F (38.2 C) (Oral)   Resp 17   Ht 5' 10.5" (1.791 m)   Wt 112 kg   SpO2 96%   BMI 34.94 kg/m   Physical Exam  Constitutional: He appears well-developed and well-nourished. No distress.    HENT:  Head: Normocephalic and atraumatic.  Eyes: Conjunctivae are normal.  Neck: Neck supple.  Cardiovascular: Normal rate, regular rhythm and normal heart sounds.   Pulmonary/Chest: Effort normal. No respiratory distress. He has no wheezes. He has no rales.  Abdominal: Soft. Bowel sounds are normal. He exhibits no distension. There is tenderness. There is no rebound.  Large healed vertical incision to the abdomen. JP drain in the left mid abd side, draining purulent drainage, with purulent drainage coming out from around the drain. There is prior JP drain site to the left abd with purulent drainage from the site. There is extensive lower abdominal cellulitis on left extending into lower back. Diffuse tenderness. No guarding. Ostomy in RLQ, with liquid brown stool  Musculoskeletal: He exhibits no edema.  Neurological: He is alert.  Skin: Skin is warm and dry.  Nursing note and vitals reviewed.    ED Treatments / Results  Labs (all labs ordered are listed, but only abnormal results are displayed) Labs Reviewed  CBC WITH DIFFERENTIAL/PLATELET - Abnormal; Notable for the following:       Result Value   WBC 14.2 (*)    RDW 15.9 (*)    Neutro Abs 11.5 (*)    Monocytes Absolute 1.6 (*)    All other components within normal limits  URINALYSIS, ROUTINE W REFLEX MICROSCOPIC (NOT AT [facility]) - Abnormal; Notable for the following:    Hgb urine dipstick TRACE (*)    Protein, ur 30 (*)    All other components within normal limits  URINE MICROSCOPIC-ADD ON - Abnormal; Notable for the following:    Squamous Epithelial / LPF 0-5 (*)    Bacteria, UA FEW (*)    Casts GRANULAR CAST (*)    All other components within normal limits  DIGOXIN LEVEL - Abnormal; Notable for the following:    Digoxin Level 0.3 (*)    All other components within normal limits  COMPREHENSIVE METABOLIC PANEL - Abnormal; Notable for the following:    Sodium 132 (*)    Potassium 5.4 (*)    Chloride 97 (*)    Glucose, Bld  109 (*)    Creatinine, Ser 1.27 (*)    Calcium 8.7 (*)    Albumin 2.6 (*)    All other components within normal limits  I-STAT CHEM 8, ED - Abnormal; Notable for the following:    Sodium 132 (*)    Chloride 97 (*)    BUN 22 (*)    Glucose, Bld 135 (*)  Calcium, Ion 1.11 (*)    All other components within normal limits  CULTURE, BLOOD (ROUTINE X 2)  CULTURE, BLOOD (ROUTINE X 2)  URINE CULTURE  I-STAT CG4 LACTIC ACID, ED  I-STAT CG4 LACTIC ACID, ED    EKG  EKG Interpretation None       Radiology Ct Abdomen Pelvis W Contrast  Result Date: 11/17/2015 CLINICAL DATA:  Abdominal pain, fever, and vomiting for several days. Drainage around percutaneous drainage catheter an from old incision sites. 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 EXAM: CT ABDOMEN AND PELVIS WITH CONTRAST TECHNIQUE: Multidetector CT imaging of the abdomen and pelvis was performed using the standard protocol following bolus administration of intravenous contrast. CONTRAST:  148mL ISOVUE-300 IOPAMIDOL (ISOVUE-300) INJECTION 61% COMPARISON:  10/23/2015 FINDINGS: Lower Chest: Minimal increase in size of tiny left pleural effusion and left lower lobe atelectasis. Hepatobiliary: No mass identified. Stable tiny cyst in posterior right hepatic lobe. Tiny calcified gallstone again seen, without evidence of cholecystitis or biliary ductal dilatation. Pancreas:  No mass or inflammatory changes. Spleen: Within normal limits in size and appearance. Adrenals/Urinary Tract: No masses identified. No evidence of hydronephrosis. Stomach/Bowel: Postop changes again seen from previous left hemicolectomy with right abdominal colostomy. The Hartmann's pouch which extends into the left lower quadrant shows a persistent small fluid collection adjacent to surgical staples, which measures 1.7 x 3.0 cm on image 63/201 compared to 2.0 x 2.5 cm previously . Persistent fistulous tract extends from this fluid collection through the  the lateral left lower quadrant abdominal wall soft tissues. Previously seen fluid collection in the subcutaneous tissues of the left lower quadrant abdominal wall has resolved. Another fistulous tract is again seen which appears to extend superiorly to another fluid collection in the left paracolic gutter which measures 2.3 x 4.9 cm on image 40/201, compared to 2.1 x 5.1 cm previously. A left abdominal percutaneous drainage catheter remains in place in the left posterior para renal space. There has been reaccumulation of a fluid collection surrounding the distal pigtail loop of the catheter which measures 2.8 x 5.8 cm on image 38/201, and extends superiorly into the left posterior subdiaphragmatic region.No other new or enlarging fluid collections are identified. Vascular/Lymphatic: No pathologically enlarged lymph nodes. No abdominal aortic aneurysm. Reproductive:  No mass identified. Other: Large midline ventral abdominal wall hernia again seen containing small bowel and colon. No evidence of bowel obstruction. Normal appendix visualized. Musculoskeletal:  No suspicious bone lesions identified. IMPRESSION: Reaccumulation of retroperitoneal fluid collection in the left posterior pararenal space surrounding the percutaneous drainage catheter, which extends superiorly into the left posterior subdiaphragmatic region. Mild increase in small left pleural effusion left basilar atelectasis. Stable small fluid collection in left lower quadrant adjacent to Hartmann's pouch anastomosis, with associated fistulous tracts. Small fluid collection in the left paracolic gutter remains stable, while there has been resolution of fluid collection in the left abdominal wall subcutaneous tissues. Stable large ventral abdominal wall hernia containing small bowel and colon. No evidence of bowel obstruction. Electronically Signed   By: Earle Gell M.D.   On: 11/17/2015 16:41   Dg Chest Portable 1 View  Result Date:  11/17/2015 CLINICAL DATA:  Vomiting, drainage from bold incision site EXAM: PORTABLE CHEST 1 VIEW COMPARISON:  07/25/2015 FINDINGS: There is left lower lobe airspace disease likely reflecting atelectasis versus pneumonia. There is no pleural effusion or pneumothorax. The heart and mediastinal contours are unremarkable. The osseous structures are unremarkable. IMPRESSION: Left lower lobe airspace disease the reflect  atelectasis versus pneumonia. Electronically Signed   By: Kathreen Devoid   On: 11/17/2015 13:58    Procedures Procedures (including critical care time)  Medications Ordered in ED Medications  methylPREDNISolone sodium succinate (SOLU-MEDROL) 125 mg/2 mL injection 125 mg (not administered)  diphenhydrAMINE (BENADRYL) injection 50 mg (not administered)  HYDROmorphone (DILAUDID) injection 1 mg (not administered)  ondansetron (ZOFRAN) injection 4 mg (not administered)  acetaminophen (TYLENOL) tablet 650 mg (not administered)  sodium chloride 0.9 % bolus 1,000 mL (not administered)     Initial Impression / Assessment and Plan / ED Course  I have reviewed the triage vital signs and the nursing notes.  Pertinent labs & imaging results that were available during my care of the patient were reviewed by me and considered in my medical decision making (see chart for details).  Clinical Course   Pt in ED with complaint of recurrent intra abdominal infection. Fever up to 102 at home. Here VS normal. Febrile, will give tylenol. Pain medications ordered.   Will premedicate for CT with 1hr meds, solumedrol 125mg  IV and benadryl 50mg  IV. Pt has had these medications in the past and tolerated CT contrast well. Got approved by Dr. Candise Che.   2:24 PM Spoke with general surgery, will come by and see. Asked to start on zosyn  4:35 PM Pt received antibiotic. Seen by general surgery. CT abd/pelvis pending. Pt will be admitted to teaching service for further treatment. HR in 80s at this time. BP  90s/60s. Will give another bolus of fluids. Careful hydration due to CHF.   Spoke with internal med, will admit  Vitals:   11/17/15 1430 11/17/15 1445 11/17/15 1500 11/17/15 1628  BP: 92/67 97/64 99/59  91/61  Pulse: 90 91 82 86  Resp: 25  23 14   Temp:      TempSrc:      SpO2: 94% 94% 93% 97%  Weight:      Height:         Final Clinical Impressions(s) / ED Diagnoses   Final diagnoses:  Intra-abdominal abscess [facility])    New Prescriptions New Prescriptions   No medications on file     Jeannett Senior, PA-C 11/17/15 1735    Merrily Pew, MD 11/19/15 [phone]

## 2015-11-17 NOTE — ED Notes (Signed)
Patient taken to CT.

## 2015-11-17 NOTE — ED Notes (Signed)
Both blood cults have been drawn for this poss sepsis patient

## 2015-11-17 NOTE — ED Notes (Signed)
Pt is aware urine is needed. Pt stated he is unable to give a sample at this time. Urinal at bedside.

## 2015-11-17 NOTE — ED Notes (Signed)
Attempted report x1. 

## 2015-11-17 NOTE — ED Provider Notes (Signed)
Medical screening examination/treatment/procedure(s) were conducted as a shared visit with non-physician practitioner(s) and myself.  I personally evaluated the patient during the encounter.  50 yo M with multiple previous surgeries and abscesses here with fever and abdominal pain again. After review of his history it sounds like he has some type of fistula that is draining. Here he is febrile and tachycardic c/w likely SIRS. Abdomen draining purulent appearing material. Generalized ttp.  concern for another infection vs peritonitis. Will CT and consult surgery.    Merrily Pew, MD 11/19/15 [phone]

## 2015-11-17 NOTE — Consult Note (Signed)
Reason for Consult: abdominal pain, back pain, purulent drainage from the old drain site and new drainage into JP; fever Referring Physician: Dr. Lauretta Grill  William Fischer is an 50 y.o. male.   CC:Pt reports he started feeling bad 3 weeks ago.  Drains were empty not draining and old drain site was not draining at that time.  He is waiting on colostomy reversal and hernia repair scheduled for next, month.  8 days ago he started having some pain in his back, and lower abdomen on the left.  Over the weekend it became worse, and he developed fever up to 102, and the LLQ site where his drain came out started draining again.  Today the old JP drain started draining purulent fluid also and he returns for evaluation.  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 His postoperative has been complicated by [name] pouch dehiscence and multiple admissions for recurrent intra-abdominal abscesses. He has had multiple admissions (7).He currently has 2 drains in place, placed by IR 09/30/15.  He went home on 10/02/15  He was treated at that time with Cefdinir/Flagyl and PO vancomycin for C diff. Repeat CT scan on 10/15/15, he had lost the LLQ drain at home after it was caught on a door.  No drainage from the site after it came out.  He completed antibiotic Rx.  Repeat CT at that time revealed a leak in the rectal stump with fistulous tract to to his left psoas area as well as the LLQ area where his previous drain was in place.  Work up in the ED shows fever with Tm 100.8.  His BP has come down during his stay in the ED.  Labs show Na 132, K+5.4, CO2 97, creatinine is up to 1.27. Lactate, and LFT's are normal.  I don't see a CBC.  CT scan shows re accumulation of retroperitoneal fluid, left posterior pararenal space surrounding the percutaneous drainage catheter.  It extends superiorly to the left posterior subdiaphragmatic region mild increase in left pleural effusion and left basilar atelectasis. Fluid collection left lower quadrant adjacent to the Hartman's is pouch is stable as stated with fistulous track resolution of the fluid collection left abdominal wall subcutaneous tissue. He also has a large ventral hernia wall containing small bowel and colon. There is no obstruction seen.We are ask to see.  He is admitted by medicine. His last dose of Xarelto was last evening 11/16/15.   [name]  Past Medical History:  Diagnosis Date  . Allergy to IVP dye   . Atrial fibrillation ([place])    admx 11/13 with acute sCHF in setting of RVR  => a. failed DCCV x 2; b. Pradaxa started;  c. failed sotalol  . Cardiomyopathy ([place])    likely tachy mediated in setting of AF with RVR  . Chicken pox as a child  . Chronic anticoagulation    Pradaxa  . Chronic systolic heart failure ([place])    a. echo 11/13: Ef 40-45%, diff HK, mod MR, mod LAE, mild RVE, mod RAE, small effusion;   b. TEE 11/13:  EF 35-40%, no LAA clot; Echo 2/14 shows normal EF  . Depression with anxiety 06/29/2011  . Migraine 06/29/2011  . Obesity 06/29/2011  . Snoring    patient needs sleep study - has declined    Past Surgical History:  Procedure Laterality Date  .  CARDIOVERSION  11/30/2011   Procedure: CARDIOVERSION;  Surgeon: Thayer Headings, MD;  Location: [place];  Service: Cardiovascular;  Laterality: N/A;  . CARDIOVERSION  12/03/2011   Procedure: CARDIOVERSION;  Surgeon: Jolaine Artist, MD;  Location: [facility];  Service: Cardiovascular;  Laterality: N/A;  . IR GENERIC HISTORICAL  08/11/2015   IR RADIOLOGIST EVAL & MGMT 08/11/2015 Markus Daft, MD [facility] INTERV RAD  . LAPAROTOMY N/A 01/17/2015   Procedure: EXPLORATORY LAPAROTOMY WITH LEFT COLECTOMY AND COLOSTOMY;  Surgeon: Rolm Bookbinder, MD;  Location: [place];  Service: General;  Laterality: N/A;  . TEE WITHOUT CARDIOVERSION  11/30/2011    Procedure: TRANSESOPHAGEAL ECHOCARDIOGRAM (TEE);  Surgeon: Thayer Headings, MD;  Location: [facility] ENDOSCOPY;  Service: Cardiovascular;  Laterality: N/A;    Family History  Problem Relation Age of Onset  . Hypertension Mother   . Hypertension Father   . Aneurysm Father   . Alcohol abuse Father   . Cancer Maternal Grandmother     brain  . Diabetes Maternal Grandfather     type 2  . Parkinsonism Maternal Grandfather   . Cancer Paternal Grandmother     breast  . Diabetes Paternal Grandmother   . Alcohol abuse Paternal Grandfather     Social History:  reports that he quit smoking about 16 months ago. His smoking use included Cigarettes. He has a 20.00 pack-year smoking history. He has never used smokeless tobacco. He reports that he does not drink alcohol or use drugs.  Allergies:  Allergies  Allergen Reactions  . Contrast Media [Iodinated Diagnostic Agents] Rash    a diffuse macular rash after CTA chest  Pt was premedicated with 125mg  IV Solumedrol, 50mg  IV Benadryl 1 hr prior to CTexam, and tolerated procedure without any difficulties. 11/28/11 Also same pre med protocol observed on 02/22/15 and pt tolerated the procedure well    Medications:  Prior to Admission:  (Not in a hospital admission) Scheduled: Continuous: PRN: Anti-infectives    Start     Dose/Rate Route Frequency Ordered Stop   11/17/15 1430  piperacillin-tazobactam (ZOSYN) IVPB 3.375 g     3.375 g 100 mL/hr over 30 Minutes Intravenous  Once 11/17/15 1421 11/17/15 1622      Results for orders placed or performed during the hospital encounter of 11/17/15 (from the past 48 hour(s))  CBC with Differential     Status: Abnormal   Collection Time: 11/17/15 12:01 PM  Result Value Ref Range   WBC 14.2 (H) 4.0 - 10.5 K/uL   RBC 4.74 4.22 - 5.81 MIL/uL   Hemoglobin 14.7 13.0 - 17.0 g/dL   HCT 42.0 39.0 - 52.0 %   MCV 88.6 78.0 - 100.0 fL   MCH 31.0 26.0 - 34.0 pg   MCHC 35.0 30.0 - 36.0 g/dL   RDW 15.9 (H) 11.5 - 15.5  %   Platelets 390 150 - 400 K/uL   Neutrophils Relative % 82 %   Neutro Abs 11.5 (H) 1.7 - 7.7 K/uL   Lymphocytes Relative 7 %   Lymphs Abs 1.0 0.7 - 4.0 K/uL   Monocytes Relative 11 %   Monocytes Absolute 1.6 (H) 0.1 - 1.0 K/uL   Eosinophils Relative 0 %   Eosinophils Absolute 0.0 0.0 - 0.7 K/uL   Basophils Relative 0 %   Basophils Absolute 0.0 0.0 - 0.1 K/uL  I-Stat CG4 Lactic Acid, ED     Status: None   Collection Time: 11/17/15 12:28 PM  Result Value Ref Range   Lactic  Acid, Venous 1.63 0.5 - 1.9 mmol/L  I-Stat Chem 8, ED     Status: Abnormal   Collection Time: 11/17/15  1:29 PM  Result Value Ref Range   Sodium 132 (L) 135 - 145 mmol/L   Potassium 5.0 3.5 - 5.1 mmol/L   Chloride 97 (L) 101 - 111 mmol/L   BUN 22 (H) 6 - 20 mg/dL   Creatinine, Ser 1.20 0.61 - 1.24 mg/dL   Glucose, Bld 135 (H) 65 - 99 mg/dL   Calcium, Ion 1.11 (L) 1.15 - 1.40 mmol/L   TCO2 25 0 - 100 mmol/L   Hemoglobin 15.0 13.0 - 17.0 g/dL   HCT 44.0 39.0 - 52.0 %    Dg Chest Portable 1 View  Result Date: 11/17/2015 CLINICAL DATA:  Vomiting, drainage from bold incision site EXAM: PORTABLE CHEST 1 VIEW COMPARISON:  07/25/2015 FINDINGS: There is left lower lobe airspace disease likely reflecting atelectasis versus pneumonia. There is no pleural effusion or pneumothorax. The heart and mediastinal contours are unremarkable. The osseous structures are unremarkable. IMPRESSION: Left lower lobe airspace disease the reflect atelectasis versus pneumonia. Electronically Signed   By: Kathreen Devoid   On: 11/17/2015 13:58    Review of Systems  Constitutional: Positive for fever (up to 102 at home Sunday 10/29) and weight loss.       Morbidly obese male who has been sick for a year.  Currently he only walks in the house and has SPB lying down.  Very depressed and deconditioned.    HENT: Negative.   Eyes: Negative.   Respiratory: Positive for cough, sputum production and shortness of breath (seem to be related to  position.  sleeps in recliner since his first surgery for perforated colon.).   Cardiovascular: Positive for chest pain (in the past none for a year) and orthopnea. Negative for claudication and leg swelling.       He only walks in the house to the bathroom.  Gastrointestinal: Positive for abdominal pain, heartburn, nausea and vomiting. Negative for blood in stool, constipation and melena.       He notes stools are now very watery.    Genitourinary: Negative for dysuria, flank pain, frequency, hematuria and urgency.       He notes the urine has gotten a dark orange color over the last week.  Musculoskeletal: Positive for back pain (back pain started about a week ago on left side).  Skin:       Draining purulent fluid from the LLQ old drain site.  Erythema abdominal wall and midline.  Neurological: Positive for weakness.       Pt reports feeling weak and falling last week.  He missed his chair sitting down.    Endo/Heme/Allergies: Bruises/bleeds easily (On Xarelto last dose 10/30 in the PM).  Psychiatric/Behavioral: The patient is nervous/anxious.        Unable to sleep, progressively worse last 6 weeks.   Blood pressure (!) 151/128, pulse 92, temperature 100.8 F (38.2 C), temperature source Oral, resp. rate 17, height 5' 10.5" (1.791 m), weight 112 kg (247 lb), SpO2 96 %. Physical Exam  Constitutional: He is oriented to person, place, and time.  Obese WM, chronically ill appearing, with purulent fluid coming from LLQ old drain site, Purulent fluid in old IR drain left side, some drainage around the drain also.  HENT:  Head: Normocephalic and atraumatic.  Eyes: Right eye exhibits no discharge. Left eye exhibits no discharge. No scleral icterus.  Neck: Normal range of motion.  Neck supple. No JVD present. No tracheal deviation present. No thyromegaly present.  Cardiovascular: Normal rate and intact distal pulses.   No murmur heard. Chronic AF  Respiratory: Effort normal and breath sounds  normal. No respiratory distress. He has no wheezes. He has no rales. He exhibits no tenderness.  GI: Soft. He exhibits distension. He exhibits no mass. There is tenderness. There is no rebound and no guarding.  Obese, with erythema along midline old incision.  Multiple ventral hernias.  Ostomy bag full of malodorous fluid.  Very watery which he says is new. Drain site LLQ drain is out now draining purulent fluid.  Old IR drain Left side that has been dry started draining purulent fluid that started draining today.    Musculoskeletal: He exhibits no edema or tenderness.  Lymphadenopathy:    He has no cervical adenopathy.  Neurological: He is alert and oriented to person, place, and time. No cranial nerve deficit.  Skin: Skin is warm and dry. No rash noted. No erythema. No pallor.  Psychiatric: He has a normal mood and affect. His behavior is normal. Judgment and thought content normal.    Assessment/Plan: Recurring retroperitoneal fluid collection/abscess Fluid collection left lower quadrant adjacent to Hartman's pouch secondary to leak of his rectal stump. Simona Huh post emergent hemicolectomy with colostomy 01/17/15, Rolm Bookbinder History of C. difficile colitis History of atrial fibrillation with chronic coagulation/Xarelto last dose 11/16/15 Cardiomyopathy with a history of chronic systolic heart failure Obesity Situational depression and anxiety History tobacco use  Plan: He's been admitted to medicine for medical management. We've restarted Zosyn in the emergency department. Will review CT scan and discuss further interventional drainage of fluid collections.   Glenis Musolf 11/17/2015, 2:12 PM

## 2015-11-17 NOTE — ED Notes (Signed)
PA spoke to radiologist and he approves 1 hour administration of medication prior to CT

## 2015-11-17 NOTE — H&P (Signed)
Date: 11/17/2015               Patient Name:  William Fischer MRN: [id number]  DOB: 09/23/1965 Age / Sex: 50 y.o., male   PCP: William Herrlich, MD         Medical Service: Internal Medicine Teaching Service         Attending Physician: Dr. Lucious Groves, DO    First Contact: Dr. [name] Pager: [phone]  Second Contact: Dr. Tiburcio Pea PagerEX:[phone]       After Hours (After 5p/  First Contact Pager: [phone]  weekends / holidays): Second Contact Pager: [phone]   Chief Complaint: [name]   History of Present Illness:   50 yo man with history of Afib, cardiomyopathy, multiple admissions for recurrent abd abscesses after originally undergoing a left hemicolectomy with end colostomy for perforated ischemic colitis on 0000000 complicated by rectal stump leak with recurrent intraabdominal abscesses. Patient had underwent drain placement by IR in September. Cultures had grow gram positive cocci and gram negative rods. Was discharged with cefdinir and flagyl but per pt unsure if he took abx. He was also given PO vancomycin for C dif colitis at that time. Today CT showed Reaccumulation of retroperitoneal fluid collection in the left posterior pararenal space. He reports that he started feeling bad 3 weeks ago. The drains were not draining at that time. He has been waiting for colostomy reversal and hernia repair scheduled for Nov 20th.  He says that he developed change in the colostomy output for 4-5 days from solid brown to yellow liquid and also new onset drainage in the left JP drain for few days. He also reports fevers in the last several days to 102. Reports some nausea and vomiting.  Surgery was consulted and he was started on Zosyn. Recommendations pending. In the ER, he had T max of 100.8, Lactate and LFTs were normal.   Meds:  Current Meds  Medication Sig  . digoxin (LANOXIN) 0.125 MG tablet Take 1 tablet (0.125 mg total) by mouth daily.  [name] diltiazem (CARDIZEM CD) 180 MG 24 hr  capsule Take 1 capsule (180 mg total) by mouth 2 (two) times daily.  . furosemide (LASIX) 40 MG tablet Take 40 mg by mouth daily.  [name] lisinopril (PRINIVIL,ZESTRIL) 5 MG tablet Take 5 mg by mouth daily.  . metoprolol (LOPRESSOR) 100 MG tablet TAKE 1 TABLET (100 MG TOTAL) BY MOUTH 2 (TWO) TIMES DAILY.  . Multiple Vitamin (MULTIVITAMIN WITH MINERALS) TABS tablet Take 1 tablet by mouth daily.  . rivaroxaban (XARELTO) 20 MG TABS tablet Take 1 tablet (20 mg total) by mouth daily with supper.     Allergies: Allergies as of 11/17/2015 - Review Complete 11/17/2015  Allergen Reaction Noted  . Contrast media [iodinated diagnostic agents] Rash 11/28/2011   Past Medical History:  Diagnosis Date  . Allergy to IVP dye   . Atrial fibrillation ([place])    admx 11/13 with acute sCHF in setting of RVR  => a. failed DCCV x 2; b. Pradaxa started;  c. failed sotalol  . Cardiomyopathy ([place])    likely tachy mediated in setting of AF with RVR  . Chicken pox as a child  . Chronic anticoagulation    Pradaxa  . Chronic systolic heart failure ([place])    a. echo 11/13: Ef 40-45%, diff HK, mod MR, mod LAE, mild RVE, mod RAE, small effusion;   b. TEE 11/13:  EF 35-40%, no LAA clot; Echo  2/14 shows normal EF  . Depression with anxiety 06/29/2011  . Migraine 06/29/2011  . Obesity 06/29/2011  . Snoring    patient needs sleep study - has declined    Family History: mother - HTN, father - HTN, MGF - DM II, parkinson, MGM brain cancer.   Social History: has 2 sons, former smoker who quit on 2016 after smoking 20 pack years, denies alcohol and drug use.   Review of Systems: Constitutional: Positive for chills, fever and malaise/fatigue.  Eyes: Negative for blurred vision and double vision.  Respiratory: Negative for cough, sputum production and shortness of breath.   Cardiovascular: Negative for chest pain, palpitations and leg swelling.  Gastrointestinal: Positive for abdominal pain. Positive for drainage from JP site  and change in colostomy output  Skin: Negative for itching and rash.  Neurological:  Negative for dizziness, sensory change and focal weakness.  Psychiatric/Behavioral: Positive for depression  Physical Exam: Blood pressure (!) 100/57, pulse 77, temperature 97.9 F (36.6 C), temperature source Oral, resp. rate 19, height 5\' 11"  (1.803 m), weight 273 lb 9.6 oz (124.1 kg), SpO2 96 %.  General:  A&O, in NAD.  HEENT: PERRLA, no scleral icterus  Neck: supple, no JVD CV: Regular rate, regular rhythm, no murmurs or rubs appreciated. Resp: equal and symmetric breath sounds, no wheezing or crackles. Abd: large vertical healed incision to the abd. Has JP drain on left middle abdomen, and prior site for drain near that area. Diffusely tender to palpation. Normal bowel sounds. No rebound or guarding.  RUQ colostomy has loose yellowish stool. No gross blood noted.  Midline surgical scar. LLQ prior drain site with thick white/yellow purulent drainage. Extremities: pulses intact b/l, no edema Neurologic: Patient is alert and oriented x3, is depressed, no active SI   EKG:   CT abd: Reaccumulation of retroperitoneal fluid collection in the left posterior pararenal space surrounding the percutaneous drainage catheter, which extends superiorly into the left posterior subdiaphragmatic region. Mild increase in small left pleural effusion left basilar atelectasis.   Assessment & Plan by Problem: Active Problems:   Abdominal abscess ([place])   Intra-abdominal infection  Recurrent retroperitoneal fluid collection/abscess and purulent JP drain output:  Patient who underwent left hemicolectomy in Dec who had a very complicated post-op course, who presents with fevers, purulent drainage from JP drain and change in colostomy output, and abd pain. Today CT showed Reaccumulation of retroperitoneal fluid collection in the left posterior pararenal space, and he also has large ventral hernia. Surgery was consulted,  and started zosyn.    -surgery following -NPO -continue zosyn -on fentanyl 50 mcg q2 hours PRN for pain -awaiting recs -C-dif pending -f/u CBC and CMET   History of Afib and cardiomyopathy:  -continue digoxin, lisinopril, diltiazem, lasix and metoprolol -holding xarelto for any procedure   HTN: BPs have been stable  -continue lisinopril and metoprolol  History of Hyperglycemia: Pt had refused CBGs during prior admission. Last a1c was 6.0  In 2017      Dispo: Admit patient to Inpatient with expected length of stay greater than 2 midnights.  Signed: Burgess Estelle, MD 11/17/2015, 7:55 PM

## 2015-11-17 NOTE — ED Triage Notes (Signed)
Pt has extensive abdominal issues. Has a colostomy and a drain on this left side. Pt c/o fevers, vomiting, drainage from old incision sites. Drain isn't draining in bulb, its draining around tube. Pt states he had a fever of 102 two days ago, taken tylenol. Pt has hernias in his abdomen.

## 2015-11-18 ENCOUNTER — Encounter (HOSPITAL_COMMUNITY): Payer: Self-pay | Admitting: Interventional Radiology

## 2015-11-18 ENCOUNTER — Inpatient Hospital Stay (HOSPITAL_COMMUNITY): Payer: BLUE CROSS/BLUE SHIELD

## 2015-11-18 DIAGNOSIS — Z811 Family history of alcohol abuse and dependence: Secondary | ICD-10-CM

## 2015-11-18 DIAGNOSIS — Z87891 Personal history of nicotine dependence: Secondary | ICD-10-CM

## 2015-11-18 DIAGNOSIS — A0472 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, not specified as recurrent: Secondary | ICD-10-CM

## 2015-11-18 DIAGNOSIS — Z833 Family history of diabetes mellitus: Secondary | ICD-10-CM

## 2015-11-18 DIAGNOSIS — Z803 Family history of malignant neoplasm of breast: Secondary | ICD-10-CM

## 2015-11-18 DIAGNOSIS — K6819 Other retroperitoneal abscess: Secondary | ICD-10-CM

## 2015-11-18 DIAGNOSIS — Z933 Colostomy status: Secondary | ICD-10-CM

## 2015-11-18 DIAGNOSIS — Z7901 Long term (current) use of anticoagulants: Secondary | ICD-10-CM

## 2015-11-18 DIAGNOSIS — Z820 Family history of epilepsy and other diseases of the nervous system: Secondary | ICD-10-CM

## 2015-11-18 DIAGNOSIS — K651 Peritoneal abscess: Secondary | ICD-10-CM

## 2015-11-18 DIAGNOSIS — I429 Cardiomyopathy, unspecified: Secondary | ICD-10-CM

## 2015-11-18 DIAGNOSIS — Z8249 Family history of ischemic heart disease and other diseases of the circulatory system: Secondary | ICD-10-CM

## 2015-11-18 DIAGNOSIS — B9689 Other specified bacterial agents as the cause of diseases classified elsewhere: Secondary | ICD-10-CM

## 2015-11-18 DIAGNOSIS — Z79899 Other long term (current) drug therapy: Secondary | ICD-10-CM

## 2015-11-18 DIAGNOSIS — Z9049 Acquired absence of other specified parts of digestive tract: Secondary | ICD-10-CM

## 2015-11-18 DIAGNOSIS — I4891 Unspecified atrial fibrillation: Secondary | ICD-10-CM

## 2015-11-18 DIAGNOSIS — Z91041 Radiographic dye allergy status: Secondary | ICD-10-CM

## 2015-11-18 HISTORY — PX: PERIPHERALLY INSERTED CENTRAL CATHETER INSERTION: SHX2221

## 2015-11-18 HISTORY — PX: IR GENERIC HISTORICAL: IMG1180011

## 2015-11-18 LAB — CBC
HEMATOCRIT: 41 % (ref 39.0–52.0)
Hemoglobin: 13.8 g/dL (ref 13.0–17.0)
MCH: 30.1 pg (ref 26.0–34.0)
MCHC: 33.7 g/dL (ref 30.0–36.0)
MCV: 89.3 fL (ref 78.0–100.0)
Platelets: 397 10*3/uL (ref 150–400)
RBC: 4.59 MIL/uL (ref 4.22–5.81)
RDW: 15.7 % — AB (ref 11.5–15.5)
WBC: 13.2 10*3/uL — ABNORMAL HIGH (ref 4.0–10.5)

## 2015-11-18 LAB — COMPREHENSIVE METABOLIC PANEL
ALBUMIN: 2.4 g/dL — AB (ref 3.5–5.0)
ALK PHOS: 96 U/L (ref 38–126)
ALT: 33 U/L (ref 17–63)
AST: 20 U/L (ref 15–41)
Anion gap: 8 (ref 5–15)
BILIRUBIN TOTAL: 0.3 mg/dL (ref 0.3–1.2)
BUN: 22 mg/dL — AB (ref 6–20)
CALCIUM: 9 mg/dL (ref 8.9–10.3)
CO2: 23 mmol/L (ref 22–32)
Chloride: 103 mmol/L (ref 101–111)
Creatinine, Ser: 0.98 mg/dL (ref 0.61–1.24)
GFR calc Af Amer: >60 mL/min (ref >60)
GFR calc non Af Amer: >60 mL/min (ref >60)
GLUCOSE: 224 mg/dL — AB (ref 65–99)
POTASSIUM: 5.4 mmol/L — AB (ref 3.5–5.1)
Sodium: 134 mmol/L — ABNORMAL LOW (ref 135–145)
TOTAL PROTEIN: 6.9 g/dL (ref 6.5–8.1)

## 2015-11-18 LAB — URINE CULTURE

## 2015-11-18 LAB — GLUCOSE, CAPILLARY: GLUCOSE-CAPILLARY: 207 mg/dL — AB (ref 65–99)

## 2015-11-18 MED ORDER — LIDOCAINE HCL 1 % IJ SOLN
INTRAMUSCULAR | Status: AC
  Filled 2015-11-18: qty 20

## 2015-11-18 MED ORDER — VANCOMYCIN 50 MG/ML ORAL SOLUTION
125.0000 mg | Freq: Four times a day (QID) | ORAL | Status: DC
  Administered 2015-11-18 – 2015-11-20 (×9): 125 mg via ORAL
  Filled 2015-11-18 (×13): qty 2.5

## 2015-11-18 MED ORDER — LORAZEPAM 1 MG PO TABS: 2.0000 mg | ORAL_TABLET | Freq: Once | ORAL | Status: DC

## 2015-11-18 MED ORDER — SODIUM CHLORIDE 0.9 % IV BOLUS (SEPSIS)
1000.0000 mL | Freq: Once | INTRAVENOUS | Status: AC
  Administered 2015-11-18: 1000 mL via INTRAVENOUS

## 2015-11-18 MED ORDER — HYDROCODONE-ACETAMINOPHEN 5-325 MG PO TABS
1.0000 | ORAL_TABLET | ORAL | Status: DC | PRN
  Administered 2015-11-18 – 2015-11-19 (×5): 2 via ORAL
  Administered 2015-11-20: 1 via ORAL
  Administered 2015-11-20: 2 via ORAL
  Filled 2015-11-18 (×7): qty 2

## 2015-11-18 MED ORDER — LORAZEPAM 1 MG PO TABS: 2.0000 mg | ORAL_TABLET | Freq: Once | ORAL | Status: DC | PRN

## 2015-11-18 MED ORDER — LIDOCAINE HCL 1 % IJ SOLN
INTRAMUSCULAR | Status: DC | PRN
  Administered 2015-11-18: 10 mL

## 2015-11-18 MED ORDER — IOPAMIDOL (ISOVUE-300) INJECTION 61%
INTRAVENOUS | Status: AC
  Administered 2015-11-18: 10 mL
  Filled 2015-11-18: qty 50

## 2015-11-18 MED ORDER — HYDROCODONE-ACETAMINOPHEN 10-325 MG PO TABS
1.0000 | ORAL_TABLET | Freq: Four times a day (QID) | ORAL | Status: DC | PRN
  Administered 2015-11-18: 1 via ORAL
  Filled 2015-11-18: qty 2

## 2015-11-18 NOTE — Progress Notes (Signed)
Patient called complaining of pain and wanted multiple pain medications at one time. His blood pressure was soft and cardiac medications were also due. Writer explained to patient he could not get both pain medications. He then stated he's never asking for anything again and will "lay here in agony." Writer attempted to educate patient again and he repeated that he will not ask for anything. Dr. [name] notified and stated he will speak with patient. Will continue to monitor.  Wyonia Hough

## 2015-11-18 NOTE — Progress Notes (Addendum)
Patient returned from test when writer was at lunch. After returning from lunch writer was told by covering nurse that patient did not ring his call bell for anything since his return. A few moments later patient came into hallway demanding discharge paperwork. His mother stated she would not bring him home if he signed out AMA. He stated, "I will take a cab." and asked again for discharge papers. Dr. [name] notified and he is coming to speak with the patient. Will continue to monitor.  Wyonia Hough  Time correction: incorrect time placed, this note was for 1325 on 11/18/15

## 2015-11-18 NOTE — Progress Notes (Signed)
[facility] Progress Note     Subjective: Frustrated and angry. Tired of getting drains. C/o trouble sleeping, fever, night sweats, abdominal pain. Denies nausea/vomiting. Still with liquid stool in ostomy pouch.  TMAX 100.8  Objective: Vital signs in last 24 hours: Temp:  [97.8 F (36.6 C)-100.8 F (38.2 C)] 97.8 F (36.6 C) (11/01 0527) Pulse Rate:  [61-143] 143 (11/01 0527) Resp:  [14-25] 19 (11/01 0527) BP: (86-151)/(49-128) 105/63 (11/01 0527) SpO2:  [87 %-97 %] 96 % (11/01 0530) Weight:  [247 lb (112 kg)-273 lb 9.6 oz (124.1 kg)] 273 lb 9.6 oz (124.1 kg) (10/31 1943) Last BM Date: 11/17/15  Intake/Output from previous day: 10/31 0701 - 11/01 0700 In: 1099 [IV Piggyback:1099] Out: [id number] [Urine:1300; Drains:20; Stool:250] Intake/Output this shift: No intake/output data recorded.  PE: Gen:  Alert, NAD, unpleasant Pulm:  CTA, diminished sounds at BL lung bases, no W/R/R Abd: Soft, appropriately tender, ND, +BS, purulence in left drain and coming out of previous LLQ drain site  Lab Results:   Recent Labs  11/17/15 1201 11/17/15 1329 11/18/15 0709  WBC 14.2*  --  13.2*  HGB 14.7 15.0 13.8  HCT 42.0 44.0 41.0  PLT 390  --  397   BMET  Recent Labs  11/17/15 1446 11/18/15 0709  NA 132* 134*  K 5.4* 5.4*  CL 97* 103  CO2 24 23  GLUCOSE 109* 224*  BUN 18 22*  CREATININE 1.27* 0.98  CALCIUM 8.7* 9.0   PT/INR No results for input(s): LABPROT, INR in the last 72 hours. CMP     Component Value Date/Time   NA 134 (L) 11/18/2015 0709   K 5.4 (H) 11/18/2015 0709   CL 103 11/18/2015 0709   CO2 23 11/18/2015 0709   GLUCOSE 224 (H) 11/18/2015 0709   BUN 22 (H) 11/18/2015 0709   CREATININE 0.98 11/18/2015 0709   CALCIUM 9.0 11/18/2015 0709   PROT 6.9 11/18/2015 0709   ALBUMIN 2.4 (L) 11/18/2015 0709   AST 20 11/18/2015 0709   ALT 33 11/18/2015 0709   ALKPHOS 96 11/18/2015 0709   BILITOT 0.3 11/18/2015 0709   GFRNONAA >60 11/18/2015 0709    GFRAA >60 11/18/2015 0709   Lipase     Component Value Date/Time   LIPASE 25 10/23/2015 1149   Studies/Results: Ct Abdomen Pelvis W Contrast  Result Date: 11/17/2015 CLINICAL DATA:  Abdominal pain, fever, and vomiting for several days. Drainage around percutaneous drainage catheter an from old incision sites. 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 EXAM: CT ABDOMEN AND PELVIS WITH CONTRAST TECHNIQUE: Multidetector CT imaging of the abdomen and pelvis was performed using the standard protocol following bolus administration of intravenous contrast. CONTRAST:  164mL ISOVUE-300 IOPAMIDOL (ISOVUE-300) INJECTION 61% COMPARISON:  10/23/2015 FINDINGS: Lower Chest: Minimal increase in size of tiny left pleural effusion and left lower lobe atelectasis. Hepatobiliary: No mass identified. Stable tiny cyst in posterior right hepatic lobe. Tiny calcified gallstone again seen, without evidence of cholecystitis or biliary ductal dilatation. Pancreas:  No mass or inflammatory changes. Spleen: Within normal limits in size and appearance. Adrenals/Urinary Tract: No masses identified. No evidence of hydronephrosis. Stomach/Bowel: Postop changes again seen from previous left hemicolectomy with right abdominal colostomy. The Hartmann's pouch which extends into the left lower quadrant shows a persistent small fluid collection adjacent to surgical staples, which measures 1.7 x 3.0 cm on image 63/201 compared to 2.0 x 2.5 cm previously . Persistent fistulous tract extends from this fluid collection through the the  lateral left lower quadrant abdominal wall soft tissues. Previously seen fluid collection in the subcutaneous tissues of the left lower quadrant abdominal wall has resolved. Another fistulous tract is again seen which appears to extend superiorly to another fluid collection in the left paracolic gutter which measures 2.3 x 4.9 cm on image 40/201, compared to 2.1 x 5.1 cm previously. A left abdominal  percutaneous drainage catheter remains in place in the left posterior para renal space. There has been reaccumulation of a fluid collection surrounding the distal pigtail loop of the catheter which measures 2.8 x 5.8 cm on image 38/201, and extends superiorly into the left posterior subdiaphragmatic region.No other new or enlarging fluid collections are identified. Vascular/Lymphatic: No pathologically enlarged lymph nodes. No abdominal aortic aneurysm. Reproductive:  No mass identified. Other: Large midline ventral abdominal wall hernia again seen containing small bowel and colon. No evidence of bowel obstruction. Normal appendix visualized. Musculoskeletal:  No suspicious bone lesions identified. IMPRESSION: Reaccumulation of retroperitoneal fluid collection in the left posterior pararenal space surrounding the percutaneous drainage catheter, which extends superiorly into the left posterior subdiaphragmatic region. Mild increase in small left pleural effusion left basilar atelectasis. Stable small fluid collection in left lower quadrant adjacent to Hartmann's pouch anastomosis, with associated fistulous tracts. Small fluid collection in the left paracolic gutter remains stable, while there has been resolution of fluid collection in the left abdominal wall subcutaneous tissues. Stable large ventral abdominal wall hernia containing small bowel and colon. No evidence of bowel obstruction. Electronically Signed   By: Earle Gell M.D.   On: 11/17/2015 16:41   Dg Chest Portable 1 View  Result Date: 11/17/2015 CLINICAL DATA:  Vomiting, drainage from bold incision site EXAM: PORTABLE CHEST 1 VIEW COMPARISON:  07/25/2015 FINDINGS: There is left lower lobe airspace disease likely reflecting atelectasis versus pneumonia. There is no pleural effusion or pneumothorax. The heart and mediastinal contours are unremarkable. The osseous structures are unremarkable. IMPRESSION: Left lower lobe airspace disease the reflect  atelectasis versus pneumonia. Electronically Signed   By: Kathreen Devoid   On: 11/17/2015 13:58   Anti-infectives: Anti-infectives    Start     Dose/Rate Route Frequency Ordered Stop   11/17/15 2300  piperacillin-tazobactam (ZOSYN) IVPB 3.375 g     3.375 g 12.5 mL/hr over 240 Minutes Intravenous Every 8 hours 11/17/15 2202     11/17/15 1430  piperacillin-tazobactam (ZOSYN) IVPB 3.375 g     3.375 g 100 mL/hr over 30 Minutes Intravenous  Once 11/17/15 1421 11/17/15 1622     Assessment/Plan Recurring retroperitoneal fluid collection/abscess Fluid collection left lower quadrant adjacent to Hartman's pouch secondary to leak of his rectal stump. S/p emergent hemicolectomy with colostomy 01/17/15, Rolm Bookbinder  C. Difficile colitis - PO vanc; contact precautions   History of atrial fibrillation with chronic coagulation/Xarelto last dose 11/16/15 Cardiomyopathy with a history of chronic systolic heart failure Obesity Situational depression and anxiety History tobacco use  FEN: NPO ID: Zosyn 10/31 >>, PO vancomycin for c.dif  VTE: SCD's  Plan: IR consult for drainage/aspiration IV abx    LOS: 1 day    Jill Alexanders , [facility] 11/18/2015, 8:43 AM Pager: [phone] Consults: [phone] Mon-Fri 7:00 am-4:30 pm Sat-Sun 7:00 am-11:30 am

## 2015-11-18 NOTE — Progress Notes (Signed)
Subjective: William Fischer is uncomfortable and irritable this morning. His drain had moderate output overnight (only 20cc charted). He reports significant ongoing pain, inability to sleep, and frustration over his situation. He feels like his recurrent infections keep returning despite taking all his antibiotics and that his planned colostomy revision surgery is never going to happen. He endorses ongoing lower abdominal discomfort, pain that interferes with sleep, and anxiety. Frustration nearly led to him leaving AMA this morning but he has come around after discussion.   Objective: Vital signs in last 24 hours: Vitals:   11/17/15 1943 11/17/15 2143 11/18/15 0527 11/18/15 0530  BP: (!) 100/57 (!) 103/49 105/63   Pulse: 77 61 (!) 143   Resp: 19 18 19    Temp: 97.9 F (36.6 C) 98.7 F (37.1 C) 97.8 F (36.6 C)   TempSrc: Oral Oral Oral   SpO2: 96%  (!) 87% 96%  Weight: 124.1 kg (273 lb 9.6 oz)     Height: 5\' 11"  (1.803 m)       Intake/Output Summary (Last 24 hours) at 11/18/15 0840 Last data filed at 11/18/15 [id number]  Gross per 24 hour  Intake             1099 ml  Output             1570 ml  Net             -471 ml    Physical Exam General appearance: Ill-appearing obese gentleman, tired-appearing, irritated HENT: Normocephalic, atraumatic Cardiovascular: Regular rate and rhythm, no murmurs, rubs, gallops appreciated Respiratory/Chest: Clear to ausculation bilaterally, normal work of breathing Abdomen: Large healed vertical scar, moderate tenderness to palpation over lower abdomen, new drain in place in LLQ with purulent fluid visible in tubing, non-erythematous, RUQ colostomy with brown stool, bowel sounds present, soft Skin: Warm, dry, intact Neuro: Cranial nerves grossly intact, alert and oriented Psych: Depressed and irritable affect  Labs / Imaging / Procedures: CBC Latest Ref Rng & Units 11/18/2015 11/17/2015 11/17/2015  WBC 4.0 - 10.5 K/uL 13.2(H) - 14.2(H)  Hemoglobin  13.0 - 17.0 g/dL 13.8 15.0 14.7  Hematocrit 39.0 - 52.0 % 41.0 44.0 42.0  Platelets 150 - 400 K/uL 397 - 390   BMP Latest Ref Rng & Units 11/18/2015 11/17/2015 11/17/2015  Glucose 65 - 99 mg/dL 224(H) 109(H) 135(H)  BUN 6 - 20 mg/dL 22(H) 18 22(H)  Creatinine 0.61 - 1.24 mg/dL 0.98 1.27(H) 1.20  Sodium 135 - 145 mmol/L 134(L) 132(L) 132(L)  Potassium 3.5 - 5.1 mmol/L 5.4(H) 5.4(H) 5.0  Chloride 101 - 111 mmol/L 103 97(L) 97(L)  CO2 22 - 32 mmol/L 23 24 -  Calcium 8.9 - 10.3 mg/dL 9.0 8.7(L) -   Ct Abdomen Pelvis W Contrast  Result Date: 11/17/2015 CLINICAL DATA:  Abdominal pain, fever, and vomiting for several days. Drainage around percutaneous drainage catheter an from old incision sites. 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 EXAM: CT ABDOMEN AND PELVIS WITH CONTRAST TECHNIQUE: Multidetector CT imaging of the abdomen and pelvis was performed using the standard protocol following bolus administration of intravenous contrast. CONTRAST:  138mL ISOVUE-300 IOPAMIDOL (ISOVUE-300) INJECTION 61% COMPARISON:  10/23/2015 FINDINGS: Lower Chest: Minimal increase in size of tiny left pleural effusion and left lower lobe atelectasis. Hepatobiliary: No mass identified. Stable tiny cyst in posterior right hepatic lobe. Tiny calcified gallstone again seen, without evidence of cholecystitis or biliary ductal dilatation. Pancreas:  No mass or inflammatory changes. Spleen: Within normal limits in size and  appearance. Adrenals/Urinary Tract: No masses identified. No evidence of hydronephrosis. Stomach/Bowel: Postop changes again seen from previous left hemicolectomy with right abdominal colostomy. The Hartmann's pouch which extends into the left lower quadrant shows a persistent small fluid collection adjacent to surgical staples, which measures 1.7 x 3.0 cm on image 63/201 compared to 2.0 x 2.5 cm previously . Persistent fistulous tract extends from this fluid collection through the the lateral  left lower quadrant abdominal wall soft tissues. Previously seen fluid collection in the subcutaneous tissues of the left lower quadrant abdominal wall has resolved. Another fistulous tract is again seen which appears to extend superiorly to another fluid collection in the left paracolic gutter which measures 2.3 x 4.9 cm on image 40/201, compared to 2.1 x 5.1 cm previously. A left abdominal percutaneous drainage catheter remains in place in the left posterior para renal space. There has been reaccumulation of a fluid collection surrounding the distal pigtail loop of the catheter which measures 2.8 x 5.8 cm on image 38/201, and extends superiorly into the left posterior subdiaphragmatic region.No other new or enlarging fluid collections are identified. Vascular/Lymphatic: No pathologically enlarged lymph nodes. No abdominal aortic aneurysm. Reproductive:  No mass identified. Other: Large midline ventral abdominal wall hernia again seen containing small bowel and colon. No evidence of bowel obstruction. Normal appendix visualized. Musculoskeletal:  No suspicious bone lesions identified. IMPRESSION: Reaccumulation of retroperitoneal fluid collection in the left posterior pararenal space surrounding the percutaneous drainage catheter, which extends superiorly into the left posterior subdiaphragmatic region. Mild increase in small left pleural effusion left basilar atelectasis. Stable small fluid collection in left lower quadrant adjacent to Hartmann's pouch anastomosis, with associated fistulous tracts. Small fluid collection in the left paracolic gutter remains stable, while there has been resolution of fluid collection in the left abdominal wall subcutaneous tissues. Stable large ventral abdominal wall hernia containing small bowel and colon. No evidence of bowel obstruction. Electronically Signed   By: Earle Gell M.D.   On: 11/17/2015 16:41   Dg Chest Portable 1 View  Result Date: 11/17/2015 CLINICAL DATA:   Vomiting, drainage from bold incision site EXAM: PORTABLE CHEST 1 VIEW COMPARISON:  07/25/2015 FINDINGS: There is left lower lobe airspace disease likely reflecting atelectasis versus pneumonia. There is no pleural effusion or pneumothorax. The heart and mediastinal contours are unremarkable. The osseous structures are unremarkable. IMPRESSION: Left lower lobe airspace disease the reflect atelectasis versus pneumonia. Electronically Signed   By: Kathreen Devoid   On: 11/17/2015 13:58    Assessment/Plan: William Fischer is a 50 y.o. gentleman with PMH afib, cardiomyopathy, recurrent abdominal abscesses presenting with same.   Recurrent retroperitoneal fluid abscesses secondary to slow leak from [facility] pouch stump:  Patient who underwent left hemicolectomy in Dec 2016 who had a very complicated post-op course, recurrent abscesses despite multiple courses of antibiotics and repeated drain placements via IR. Presents again with fevers, purulent drainage from JP drain and change in colostomy output, and abd pain. CT showing reaccumulation of retroperitoneal fluid collection in the left posterior pararenal space, and he also has large ventral hernia. Surgery consulted, and started zosyn. Pain not controlled on just fentanyl this AM. WBC elevated at 13.2 this morning. IR exchanged LLQ drain this morning. - Surgery consult, appreciate recs:   - No need for emergent surgery, needs improved drainage, many months until colostomy closure viable, IR and ID consult and continue IV Zosyn - Discussed with IR, appropriate to exchange drainage catheter today, completed - ID consult, appreciate  recs - NPO - IV Zosyn - Start Norco 5-325 1-2 tablets Q4 PRN for pain - Fentanyl 50 mcg Q2h PRN for pain - Zofran PRN for nausea - Ativan 2mg  x1 PRN available for sleep or anxiety - Transient tachycardia/hypotension this AM, resolved s/p 1L NS - Follow daily CBC, BMP  C. Diff colitis, antigen and toxin positive, endorsing  increased/changed colostomy output and tenesmus and pus per rectum - Started po Vancomycin QID - Enteric precautions  Afib and cardiomyopathy - Continue home digoxin, lisinopril, diltiazem, lasix, and metoprolol - Holding Xarelto for procedure  Dispo: Anticipated discharge in approximately 3 day(s).   LOS: 1 day   [name], MD 11/18/2015, 8:40 AM Pager: [phone]

## 2015-11-18 NOTE — Consult Note (Signed)
[facility] for Infectious Disease       Reason for Consult: c diff infection with intraabominal abscess    Referring Physician: Dr. 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 digoxin  0.125 mg Oral Daily  . diltiazem  180 mg Oral BID  . Influenza vac split quadrivalent PF  0.5 mL Intramuscular Tomorrow-1000  . lidocaine      . metoprolol  100 mg Oral BID  . multivitamin with minerals  1 tablet Oral Daily  . piperacillin-tazobactam (ZOSYN)  IV  3.375 g Intravenous Q8H  . pneumococcal 23 valent vaccine  0.5 mL Intramuscular Tomorrow-1000  . vancomycin  125 mg Oral QID    Recommendations: Vancomycin oral qid for duration of antibiotics + 14 days Continue zosyn for now   Assessment: He has an intraabdominal abscess that has been recurrent and now C diff infection.    Antibiotics: Zosyn, oral vancomycin   HPI: William Fischer is a 50 y.o. male William Fischer is a 50 y.o. 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  He has had continued drains for many months.  01/28/15 noted rectal stump blowout and pelvic abscess with drainage and this grew E coli and left with Cefdinir. He returned with pain and drainage and changed to cipro.  He continued to have drainage and seemed to be improving but returned March and started on ceftriaxone and flagyl and continued with that by his report for 2 1/2 months.  In April, his CT noted resoluiton and had his picc line removed on 4/28.  He never followed up with ID as outpatient despite our recommendations.  He was admitted again in July for abscess and again in September and has  repeated fluid collections, for which he is here now.   CT independently reviewed and note fluid collection  He expresses to me that he prefers not to have a picc line because he is scheduled to have his ostomy reversed on 11/20 and doesn't want a picc line to interfere with his scheduled surgery.  I did let him know that [facility] is already aware of his hospitalization and that they do not plan to do the surgery this month but he was insistent that it was going to be done.    Review of Systems:  Constitutional: negative for fevers and chills Gastrointestinal: positive for increased ostomy output Integument/breast: negative for rash All other systems reviewed and are negative   Past Medical History:  Diagnosis Date  . Allergy to IVP dye   . Atrial fibrillation ([place])    admx 11/13 with acute sCHF in setting of RVR  => a. failed DCCV x 2; b. Pradaxa started;  c. failed sotalol  . Cardiomyopathy ([place])    likely tachy mediated in setting of AF with RVR  . Chicken pox as a child  . Chronic anticoagulation    Pradaxa  . Chronic systolic heart failure ([place])    a. echo 11/13: Ef 40-45%, diff HK, mod MR, mod LAE, mild RVE, mod RAE, small effusion;   b. TEE 11/13:  EF 35-40%, no LAA clot; Echo 2/14 shows normal EF  . Depression with anxiety 06/29/2011  .  Migraine 06/29/2011  . Obesity 06/29/2011  . Snoring    patient needs sleep study - has declined    Social History  Substance Use Topics  . Smoking status: Former Smoker    Packs/day: 1.00    Years: 20.00    Types: Cigarettes    Quit date: 07/10/2014  . Smokeless tobacco: Never Used  . Alcohol use No    Family History  Problem Relation Age of Onset  . Hypertension Mother   . Hypertension Father   . Aneurysm Father   . Alcohol abuse Father   . Cancer Maternal Grandmother     brain  . Diabetes Maternal Grandfather     type 2  . Parkinsonism Maternal Grandfather   . Cancer Paternal Grandmother     breast  . Diabetes Paternal  Grandmother   . Alcohol abuse Paternal Grandfather     Allergies  Allergen Reactions  . Contrast Media [Iodinated Diagnostic Agents] Rash    a diffuse macular rash after CTA chest  Pt was premedicated with 125mg  IV Solumedrol, 50mg  IV Benadryl 1 hr prior to CTexam, and tolerated procedure without any difficulties. 11/28/11 Also same pre med protocol observed on 02/22/15 and pt tolerated the procedure well    Physical Exam: Constitutional: in no apparent distress  Vitals:   11/18/15 1028 11/18/15 1400  BP: 110/68 103/71  Pulse:    Resp:    Temp:     EYES: anicteric ENMT: no thrush Cardiovascular: Cor RRR Respiratory: CTA B; normal respiratory effort GI: Bowel sounds are normal, ostomy noted Musculoskeletal: no pedal edema noted Skin: negatives: no rash Hematologic: no cervical lad  Lab Results  Component Value Date   WBC 13.2 (H) 11/18/2015   HGB 13.8 11/18/2015   HCT 41.0 11/18/2015   MCV 89.3 11/18/2015   PLT 397 11/18/2015    Lab Results  Component Value Date   CREATININE 0.98 11/18/2015   BUN 22 (H) 11/18/2015   NA 134 (L) 11/18/2015   K 5.4 (H) 11/18/2015   CL 103 11/18/2015   CO2 23 11/18/2015    Lab Results  Component Value Date   ALT 33 11/18/2015   AST 20 11/18/2015   ALKPHOS 96 11/18/2015     Microbiology: Recent Results (from the past 240 hour(s))  Culture, blood (Routine x 2)     Status: None (Preliminary result)   Collection Time: 11/17/15 12:15 PM  Result Value Ref Range Status   Specimen Description BLOOD RIGHT HAND  Final   Special Requests BOTTLES DRAWN AEROBIC AND ANAEROBIC  5CC  Final   Culture NO GROWTH 1 DAY  Final   Report Status PENDING  Incomplete  Culture, blood (Routine x 2)     Status: None (Preliminary result)   Collection Time: 11/17/15  1:08 PM  Result Value Ref Range Status   Specimen Description BLOOD RIGHT ANTECUBITAL  Final   Special Requests BOTTLES DRAWN AEROBIC AND ANAEROBIC  5CC  Final   Culture NO GROWTH 1 DAY   Final   Report Status PENDING  Incomplete  Urine culture     Status: Abnormal   Collection Time: 11/17/15  2:16 PM  Result Value Ref Range Status   Specimen Description URINE, CLEAN CATCH  Final   Special Requests NONE  Final   Culture <10,000 COLONIES/mL INSIGNIFICANT GROWTH (A)  Final   Report Status 11/18/2015 FINAL  Final  C difficile quick scan w PCR reflex     Status: Abnormal   Collection Time: 11/17/15  7:26 PM  Result Value Ref Range Status   C Diff antigen POSITIVE (A) NEGATIVE Final   C Diff toxin POSITIVE (A) NEGATIVE Final   C Diff interpretation Toxin producing C. difficile detected.  Final    Comment: CRITICAL RESULT CALLED TO, READ BACK BY AND VERIFIED WITH: R.GINIWA,RN AT 2348 BY L.PITT 11/17/15   Aerobic/Anaerobic Culture (surgical/deep wound)     Status: None (Preliminary result)   Collection Time: 11/18/15 12:27 PM  Result Value Ref Range Status   Specimen Description ABSCESS ABDOMEN  Final   Special Requests NONE  Final   Gram Stain   Final    ABUNDANT WBC PRESENT, PREDOMINANTLY PMN MODERATE GRAM POSITIVE COCCI IN CLUSTERS MODERATE GRAM NEGATIVE RODS MODERATE GRAM NEGATIVE COCCOBACILLI FEW GRAM POSITIVE RODS FEW GRAM POSITIVE COCCI IN PAIRS    Culture PENDING  Incomplete   Report Status PENDING  Incomplete    Dalary Hollar, Herbie [place], [place] for Infectious Disease [facility] [email or web address] [phone] pager  [phone] cell 11/18/2015, 5:34 PM

## 2015-11-18 NOTE — Procedures (Signed)
Interventional Radiology Procedure Note  Procedure: Left retroperitoneal abscess tube injection and exchange with new 48F pigtail catheter.    Complications: None  Recommendations:  - Ok to shower tomorrow - Gravity drain placed. - BID drain flushes - Do not submerge  - Routine drain care   Signed,  Dulcy Fanny. Earleen Newport, DO

## 2015-11-19 ENCOUNTER — Inpatient Hospital Stay (HOSPITAL_COMMUNITY): Payer: BLUE CROSS/BLUE SHIELD

## 2015-11-19 DIAGNOSIS — B965 Pseudomonas (aeruginosa) (mallei) (pseudomallei) as the cause of diseases classified elsewhere: Secondary | ICD-10-CM

## 2015-11-19 LAB — CBC
HEMATOCRIT: 40.6 % (ref 39.0–52.0)
HEMOGLOBIN: 13.4 g/dL (ref 13.0–17.0)
MCH: 29.8 pg (ref 26.0–34.0)
MCHC: 33 g/dL (ref 30.0–36.0)
MCV: 90.4 fL (ref 78.0–100.0)
Platelets: 465 10*3/uL — ABNORMAL HIGH (ref 150–400)
RBC: 4.49 MIL/uL (ref 4.22–5.81)
RDW: 15.8 % — ABNORMAL HIGH (ref 11.5–15.5)
WBC: 17.7 10*3/uL — ABNORMAL HIGH (ref 4.0–10.5)

## 2015-11-19 LAB — BASIC METABOLIC PANEL
Anion gap: 7 (ref 5–15)
BUN: 23 mg/dL — AB (ref 6–20)
CHLORIDE: 102 mmol/L (ref 101–111)
CO2: 27 mmol/L (ref 22–32)
CREATININE: 0.9 mg/dL (ref 0.61–1.24)
Calcium: 8.9 mg/dL (ref 8.9–10.3)
GFR calc Af Amer: >60 mL/min (ref >60)
GFR calc non Af Amer: >60 mL/min (ref >60)
Glucose, Bld: 155 mg/dL — ABNORMAL HIGH (ref 65–99)
Potassium: 4.5 mmol/L (ref 3.5–5.1)
Sodium: 136 mmol/L (ref 135–145)

## 2015-11-19 LAB — GLUCOSE, CAPILLARY: GLUCOSE-CAPILLARY: 132 mg/dL — AB (ref 65–99)

## 2015-11-19 MED ORDER — SODIUM CHLORIDE 0.9% FLUSH
10.0000 mL | INTRAVENOUS | Status: DC | PRN
Start: 2015-11-19 — End: 2015-11-20

## 2015-11-19 NOTE — Progress Notes (Signed)
[facility] Progress Note     Subjective: Better spirits, more cooperative today. Still c/o 11/10 abdominal pain. States he has not been receiving his pain medication when he asks because of his blood pressure. Ostomy output becoming more soft and only having to empty his pouch once daily. Denies nausea/vomiting. Reports ambulating in room.  VSS, afebrile Only 20 cc/24h drain output charted in epic this AM  Objective: Vital signs in last 24 hours: Temp:  [97.8 F (36.6 C)-98 F (36.7 C)] 97.8 F (36.6 C) (11/02 0427) Pulse Rate:  [45-91] 65 (11/02 0427) Resp:  [16] 16 (11/02 0427) BP: (100-119)/(65-71) 103/65 (11/02 0427) SpO2:  [97 %-100 %] 100 % (11/02 0427) Weight:  [269 lb 14.4 oz (122.4 kg)] 269 lb 14.4 oz (122.4 kg) (11/02 0335) Last BM Date: 11/17/15  Intake/Output from previous day: 11/01 0701 - 11/02 0700 In: -  Out: [id number] [Urine:1375] Intake/Output this shift: No intake/output data recorded.  PE: Gen:  Alert, NAD, pleasant Pulm:  CTA, no W/R/R Abd: Soft, obese, appropriately tender, ND, +BS, left IR drain with minimal purulent drainage, previous laparotomy scar noted  Lab Results:   Recent Labs  11/18/15 0709 11/19/15 0624  WBC 13.2* 17.7*  HGB 13.8 13.4  HCT 41.0 40.6  PLT 397 465*   BMET  Recent Labs  11/18/15 0709 11/19/15 0624  NA 134* 136  K 5.4* 4.5  CL 103 102  CO2 23 27  GLUCOSE 224* 155*  BUN 22* 23*  CREATININE 0.98 0.90  CALCIUM 9.0 8.9   PT/INR No results for input(s): LABPROT, INR in the last 72 hours. CMP     Component Value Date/Time   NA 136 11/19/2015 0624   K 4.5 11/19/2015 0624   CL 102 11/19/2015 0624   CO2 27 11/19/2015 0624   GLUCOSE 155 (H) 11/19/2015 0624   BUN 23 (H) 11/19/2015 0624   CREATININE 0.90 11/19/2015 0624   CALCIUM 8.9 11/19/2015 0624   PROT 6.9 11/18/2015 0709   ALBUMIN 2.4 (L) 11/18/2015 0709   AST 20 11/18/2015 0709   ALT 33 11/18/2015 0709   ALKPHOS 96 11/18/2015 0709   BILITOT  0.3 11/18/2015 0709   GFRNONAA >60 11/19/2015 0624   GFRAA >60 11/19/2015 0624   Lipase     Component Value Date/Time   LIPASE 25 10/23/2015 1149       Studies/Results: Ct Abdomen Pelvis W Contrast  Result Date: 11/17/2015 CLINICAL DATA:  Abdominal pain, fever, and vomiting for several days. Drainage around percutaneous drainage catheter an from old incision sites. 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 EXAM: CT ABDOMEN AND PELVIS WITH CONTRAST TECHNIQUE: Multidetector CT imaging of the abdomen and pelvis was performed using the standard protocol following bolus administration of intravenous contrast. CONTRAST:  136mL ISOVUE-300 IOPAMIDOL (ISOVUE-300) INJECTION 61% COMPARISON:  10/23/2015 FINDINGS: Lower Chest: Minimal increase in size of tiny left pleural effusion and left lower lobe atelectasis. Hepatobiliary: No mass identified. Stable tiny cyst in posterior right hepatic lobe. Tiny calcified gallstone again seen, without evidence of cholecystitis or biliary ductal dilatation. Pancreas:  No mass or inflammatory changes. Spleen: Within normal limits in size and appearance. Adrenals/Urinary Tract: No masses identified. No evidence of hydronephrosis. Stomach/Bowel: Postop changes again seen from previous left hemicolectomy with right abdominal colostomy. The Hartmann's pouch which extends into the left lower quadrant shows a persistent small fluid collection adjacent to surgical staples, which measures 1.7 x 3.0 cm on image 63/201 compared to 2.0 x 2.5 cm  previously . Persistent fistulous tract extends from this fluid collection through the the lateral left lower quadrant abdominal wall soft tissues. Previously seen fluid collection in the subcutaneous tissues of the left lower quadrant abdominal wall has resolved. Another fistulous tract is again seen which appears to extend superiorly to another fluid collection in the left paracolic gutter which measures 2.3 x 4.9 cm on image  40/201, compared to 2.1 x 5.1 cm previously. A left abdominal percutaneous drainage catheter remains in place in the left posterior para renal space. There has been reaccumulation of a fluid collection surrounding the distal pigtail loop of the catheter which measures 2.8 x 5.8 cm on image 38/201, and extends superiorly into the left posterior subdiaphragmatic region.No other new or enlarging fluid collections are identified. Vascular/Lymphatic: No pathologically enlarged lymph nodes. No abdominal aortic aneurysm. Reproductive:  No mass identified. Other: Large midline ventral abdominal wall hernia again seen containing small bowel and colon. No evidence of bowel obstruction. Normal appendix visualized. Musculoskeletal:  No suspicious bone lesions identified. IMPRESSION: Reaccumulation of retroperitoneal fluid collection in the left posterior pararenal space surrounding the percutaneous drainage catheter, which extends superiorly into the left posterior subdiaphragmatic region. Mild increase in small left pleural effusion left basilar atelectasis. Stable small fluid collection in left lower quadrant adjacent to Hartmann's pouch anastomosis, with associated fistulous tracts. Small fluid collection in the left paracolic gutter remains stable, while there has been resolution of fluid collection in the left abdominal wall subcutaneous tissues. Stable large ventral abdominal wall hernia containing small bowel and colon. No evidence of bowel obstruction. Electronically Signed   By: Earle Gell M.D.   On: 11/17/2015 16:41   Ir Catheter Tube Change  Result Date: 11/18/2015 INDICATION: 50 year old male with history of emergent hemicolectomy and colostomy 01/17/2015 secondary to perforation. He has had ongoing difficulty with peritoneal contamination with multiple drainage tubes place. Most currently he has a single percutaneous drainage catheter placed into retroperitoneal abscess September 30, 2015 adjacent to the psoas  muscle. Most recent CT demonstrates persisting fluid adjacent to the catheter. He presents for evaluation of possible exchange. EXAM: IR CATHETER TUBE CHANGE MEDICATIONS: The patient is currently admitted to the hospital and receiving intravenous antibiotics. The antibiotics were administered within an appropriate time frame prior to the initiation of the procedure. ANESTHESIA/SEDATION: None COMPLICATIONS: None PROCEDURE: Informed written consent was obtained from the patient after a thorough discussion of the procedural risks, benefits and alternatives. All questions were addressed. Maximal Sterile Barrier Technique was utilized including caps, mask, sterile gowns, sterile gloves, sterile drape, hand hygiene and skin antiseptic. A timeout was performed prior to the initiation of the procedure. Patient is position prone position on the fluoroscopy table. Scout images of the abdomen were performed. 1% lidocaine was used for local anesthesia at the skin site. Gentle injection through the existing tube was performed demonstrating residual abscess cavity within the left retroperitoneum. Catheter was ligated in using modified Seldinger technique, a new 29 French pigtail catheter was advanced into the collection. Once the pigtail catheter was formed within the cavity, a sample was aspirated for culture. Approximately 300 cc of normal saline was used for irrigation at the catheter tip. Final image was stored and the catheter was sutured in position. Patient tolerated the procedure well and remained hemodynamically stable throughout. No complications were encountered and no significant blood loss encountered. IMPRESSION: Status post left retroperitoneal drain injection and exchange with placement of a new 14 French pigtail catheter. Sample was sent for analysis. Signed,  Dulcy Fanny. Earleen Newport, DO [facility] Electronically Signed   By: Corrie Mckusick D.O.   On: 11/18/2015  12:45   Dg Chest Portable 1 View  Result Date: 11/17/2015 CLINICAL DATA:  Vomiting, drainage from bold incision site EXAM: PORTABLE CHEST 1 VIEW COMPARISON:  07/25/2015 FINDINGS: There is left lower lobe airspace disease likely reflecting atelectasis versus pneumonia. There is no pleural effusion or pneumothorax. The heart and mediastinal contours are unremarkable. The osseous structures are unremarkable. IMPRESSION: Left lower lobe airspace disease the reflect atelectasis versus pneumonia. 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 difficile colitis  11/18/15: IR exchanged left retroperitoneal abscess tube with a new 53F pigtail catheter  appreciate ID consulting and giving recommendations regarding type and duration antibiotic therapy  WBC 17.7 from 13.2 yesterday, repeat CBC in AM  OOB to chair/ambulate   May shower daily   History of atrial fibrillation with chronic coagulation/Xarelto last dose 11/16/15 Cardiomyopathy with a history of chronic systolic heart failure Obesity Situational depression and anxiety History tobacco use  FEN: full liquid diet ID: Zosyn 10/31 >>, PO vancomycin for c.dif   VTE: SCD's  Dispo: IV abx, follow drain output, repeat CBC in AM    LOS: 2 days    Jill Alexanders , [facility] 11/19/2015, 7:34 AM Pager: [phone] Consults: [phone] Mon-Fri 7:00 am-4:30 pm Sat-Sun 7:00 am-11:30 am

## 2015-11-19 NOTE — Progress Notes (Signed)
Subjective: William Fischer is feeling better today with some improvement in his abdominal pain, was able to sleep better last night. He expressed concern that his newly-placed LLQ drain had minimal output since yesterday - appears to have been left level with his abdomen - adjusted this to more gravity-dependent position. He expressed frustration and concern over his long-term plan and that IV antibiotics have failed in the past. He stood up to show [place] his ventral abdominal hernias in his lower abdomen. He reports having to empty his colostomy bag less often.  Objective: Vital signs in last 24 hours: Vitals:   11/19/15 0335 11/19/15 0427 11/19/15 0900 11/19/15 0950  BP:  103/65 112/62 112/62  Pulse:  65  83  Resp:  16    Temp:  97.8 F (36.6 C)  97.8 F (36.6 C)  TempSrc:  Oral  Oral  SpO2:  100%  98%  Weight: 122.4 kg (269 lb 14.4 oz)   123 kg (271 lb 2.7 oz)  Height:        Intake/Output Summary (Last 24 hours) at 11/19/15 1410 Last data filed at 11/19/15 1100  Gross per 24 hour  Intake              360 ml  Output             1475 ml  Net            -1115 ml    Physical Exam General appearance: Obese gentleman, tired-appearing, irritable Cardiovascular: Regular rate and rhythm, no murmurs, rubs, gallops appreciated Respiratory/Chest: Clear to ausculation bilaterally, normal work of breathing Abdomen: Large healed vertical scar, moderate tenderness to palpation over lower abdomen, new drain in place in LLQ with purulent fluid visible in tubing and pouch, non-erythematous, RUQ colostomy with brown stool, bowel sounds present, soft Skin: Warm, dry, intact Psych: Depressed and irritable affect  Labs / Imaging / Procedures: CBC Latest Ref Rng & Units 11/19/2015 11/18/2015 11/17/2015  WBC 4.0 - 10.5 K/uL 17.7(H) 13.2(H) -  Hemoglobin 13.0 - 17.0 g/dL 13.4 13.8 15.0  Hematocrit 39.0 - 52.0 % 40.6 41.0 44.0  Platelets 150 - 400 K/uL 465(H) 397 -   BMP Latest Ref Rng & Units  11/19/2015 11/18/2015 11/17/2015  Glucose 65 - 99 mg/dL 155(H) 224(H) 109(H)  BUN 6 - 20 mg/dL 23(H) 22(H) 18  Creatinine 0.61 - 1.24 mg/dL 0.90 0.98 1.27(H)  Sodium 135 - 145 mmol/L 136 134(L) 132(L)  Potassium 3.5 - 5.1 mmol/L 4.5 5.4(H) 5.4(H)  Chloride 101 - 111 mmol/L 102 103 97(L)  CO2 22 - 32 mmol/L 27 23 24   Calcium 8.9 - 10.3 mg/dL 8.9 9.0 8.7(L)   Aerobic/Anaerobic Culture (surgical/deep wound)    1d ago  Specimen Description ABSCESS ABDOMEN   Special Requests NONE   Gram Stain ABUNDANT WBC PRESENT, PREDOMINANTLY PMN  MODERATE GRAM POSITIVE COCCI IN CLUSTERS  MODERATE GRAM NEGATIVE RODS  MODERATE GRAM NEGATIVE COCCOBACILLI  FEW GRAM POSITIVE RODS  FEW GRAM POSITIVE COCCI IN PAIRS       Culture FEW PSEUDOMONAS AERUGINOSA   Report Status PENDING   Resulting Agency [facility]        Assessment/Plan: William Fischer is a 50 y.o. gentleman with PMH afib, cardiomyopathy, recurrent abdominal abscesses presenting with same.   Recurrent retroperitoneal fluid abscesses secondary to slow leak from [facility] pouch stump:  Patient who underwent left hemicolectomy in Dec [id number], very complicated post-op course, recurrent abscesses despite multiple courses of antibiotics and repeated drain placements  via IR. Presents again with fevers, purulent drainage from JP drain and change in colostomy output, and abd pain. CT showing reaccumulation of retroperitoneal fluid collection in the left posterior pararenal space, and he also has large ventral hernia. Surgery consulted, and started zosyn. Pain not controlled on just fentanyl this AM. S/p drain exchange via IR on 11/1. Preliminary culture results show Pseudomonas aeruginosa - Appreciate general surgery recs:   - No need for emergent surgery, needs improved drainage, many months until colostomy closure viable, IR and ID consult and continue IV Zosyn - Appreciate ID recs - discussed with Dr. Linus Salmons - will continue IV Zosyn for 4 weeks and po Vanc  for 6 weeks, reassess continuation of antibiotics at ID follow up - IV Zosyn Q8, placed order for PICC placement today - Norco 5-325 1-2 tablets Q4 PRN for pain - Fentanyl 50 mcg Q2h PRN for pain - Zofran PRN for nausea - Follow culture results/sensitivities - Follow daily CBC, BMP  C. Diff colitis, antigen and toxin positive - po Vancomycin QID for 6 weeks - Enteric precautions  Afib and cardiomyopathy - Continue home digoxin, lisinopril, diltiazem, lasix, and metoprolol - Resume home Xarelto after PICC line placement  Case management consult - Home health for PICC / IV antibiotic supplies and instruction - Investigate colostomy bag supply concern, patient reports [employer] will not cover this  Dispo: Anticipated discharge in approximately 1-2 day(s) .   LOS: 2 days   [name], MD 11/19/2015, 2:10 PM Pager: [phone]

## 2015-11-19 NOTE — Progress Notes (Signed)
Peripherally Inserted Central Catheter/Midline Placement  The IV Nurse has discussed with the patient and/or persons authorized to consent for the patient, the purpose of this procedure and the potential benefits and risks involved with this procedure.  The benefits include less needle sticks, lab draws from the catheter, and the patient may be discharged home with the catheter. Risks include, but not limited to, infection, bleeding, blood clot (thrombus formation), and puncture of an artery; nerve damage and irregular heartbeat and possibility to perform a PICC exchange if needed/ordered by physician.  Alternatives to this procedure were also discussed.  B[employer] PICC patient education guide, fact sheet on infection prevention and patient information card has been provided to patient /or left at bedside.    PICC/Midline Placement Documentation        William Fischer 11/19/2015, 5:36 PM Consent obtained by Claretha Cooper, RN

## 2015-11-19 NOTE — Progress Notes (Signed)
    Early for Infectious Disease   Reason for visit: Follow up on intraabdominal abscess  Interval History: culture growing Pseudomonas, sensitivities pending  Physical Exam: Constitutional:  Vitals:   11/19/15 0900 11/19/15 0950  BP: 112/62 112/62  Pulse:  83  Resp:    Temp:  97.8 F (36.6 C)   patient appears in NAD  Impression: abscess, recurrent  Plan: 1.  Zosyn every 8 hours for 4 weeks 2.  picc line and don't pull picc line until seen by ID in clinic 3.  Vancomycin oral for 2 months (may stop sooner if ok) 4. Antibiotics per home health protocol 5. Ideally, he will be able to get surgery soon to correct leak before stopping antibiotics since the same issue likely will continue  We will arrange follow up in our clinic in 3-4 weeks.   Thanks for consult.

## 2015-11-20 DIAGNOSIS — A419 Sepsis, unspecified organism: Secondary | ICD-10-CM | POA: Diagnosis not present

## 2015-11-20 DIAGNOSIS — R6521 Severe sepsis with septic shock: Secondary | ICD-10-CM | POA: Diagnosis not present

## 2015-11-20 LAB — CBC
HEMATOCRIT: 41.3 % (ref 39.0–52.0)
HEMOGLOBIN: 13.6 g/dL (ref 13.0–17.0)
MCH: 29.8 pg (ref 26.0–34.0)
MCHC: 32.9 g/dL (ref 30.0–36.0)
MCV: 90.4 fL (ref 78.0–100.0)
Platelets: 447 10*3/uL — ABNORMAL HIGH (ref 150–400)
RBC: 4.57 MIL/uL (ref 4.22–5.81)
RDW: 16 % — ABNORMAL HIGH (ref 11.5–15.5)
WBC: 14.2 10*3/uL — AB (ref 4.0–10.5)

## 2015-11-20 LAB — BASIC METABOLIC PANEL
ANION GAP: 6 (ref 5–15)
BUN: 19 mg/dL (ref 6–20)
CO2: 28 mmol/L (ref 22–32)
Calcium: 8.7 mg/dL — ABNORMAL LOW (ref 8.9–10.3)
Chloride: 102 mmol/L (ref 101–111)
Creatinine, Ser: 0.87 mg/dL (ref 0.61–1.24)
GFR calc Af Amer: >60 mL/min (ref >60)
GFR calc non Af Amer: >60 mL/min (ref >60)
GLUCOSE: 102 mg/dL — AB (ref 65–99)
POTASSIUM: 4.5 mmol/L (ref 3.5–5.1)
Sodium: 136 mmol/L (ref 135–145)

## 2015-11-20 LAB — GLUCOSE, CAPILLARY: Glucose-Capillary: 105 mg/dL — ABNORMAL HIGH (ref 65–99)

## 2015-11-20 MED ORDER — HEPARIN SOD (PORK) LOCK FLUSH 100 UNIT/ML IV SOLN
250.0000 [IU] | INTRAVENOUS | Status: AC | PRN
  Administered 2015-11-20: 250 [IU]

## 2015-11-20 MED ORDER — PIPERACILLIN-TAZOBACTAM 3.375 G IVPB: 3.3750 g | Freq: Three times a day (TID) | INTRAVENOUS | 0 refills | Status: DC

## 2015-11-20 MED ORDER — RIVAROXABAN 20 MG PO TABS
20.0000 mg | ORAL_TABLET | Freq: Every day | ORAL | Status: DC
  Administered 2015-11-20: 20 mg via ORAL
  Filled 2015-11-20: qty 1

## 2015-11-20 MED ORDER — VANCOMYCIN 50 MG/ML ORAL SOLUTION: 125.0000 mg | Freq: Four times a day (QID) | ORAL | 0 refills | Status: DC

## 2015-11-20 MED ORDER — OXYCODONE-ACETAMINOPHEN 5-325 MG PO TABS: 1.0000 | ORAL_TABLET | Freq: Four times a day (QID) | ORAL | 0 refills | Status: AC | PRN

## 2015-11-20 NOTE — Discharge Instructions (Signed)
Percutaneous Abscess Drain, Care After °Refer to this sheet in the next few weeks. These instructions provide you with information on caring for yourself after your procedure. Your health care provider may also give you more specific instructions. Your treatment has been planned according to current medical practices, but problems sometimes occur. Call your health care provider if you have any problems or questions after your procedure. °WHAT TO EXPECT AFTER THE PROCEDURE °After your procedure, it is typical to have the following:  °· A small amount of discomfort in the area where the drainage tube was placed. °· A small amount of bruising around the area where the drainage tube was placed. °· Sleepiness and fatigue for the rest of the day from the medicines used. °HOME CARE INSTRUCTIONS °· Rest at home for 1-2 days following your procedure or as directed by your health care provider. °· If you go home right after the procedure, plan to have someone with you for 24 hours. °· Do not take a bath or shower for 24 hours after your procedure. °· Take medicines only as directed by your health care provider. Ask your health care provider when you can resume taking any normal medicines. °· Change bandages (dressings) as directed.   °· You may be told to record the amount of drainage from the bag every time you empty it. Follow your health care provider's directions for emptying the bag. Write down the amount of drainage, the date, and the time you emptied it. °· Call your health care provider when the drain is putting out less than 10 mL of drainage per day for 2-3 days in a row or as directed by your health care provider. °· Follow your health care provider's instructions for cleaning the drainage tube. You may need to clean the tube every day so that it does not clog. °SEEK MEDICAL CARE IF: °· You have increased bleeding (more than a small spot) from the site where the drainage tube was placed. °· You have redness,  swelling, or increasing pain around the site where the drainage tube was placed. °· You notice a discharge or bad smell coming from the site where the drainage tube was placed. °· You have a fever or chills.  °· You have pain that is not helped by medicine.   °SEEK IMMEDIATE MEDICAL CARE IF: °· There is leakage around the drainage tube. °· The drainage tube pulls out. °· You suddenly stop having drainage from the tube. °· You suddenly have blood in the drainage fluid. °· You become dizzy or faint. °· You develop a rash.   °· You have nausea or vomiting. °· You have difficulty breathing, feel short of breath, or feel faint.   °· You develop chest pain. °· You have problems with your speech or vision. °· You have trouble balancing or moving your arms or legs. °  °This information is not intended to replace advice given to you by your health care provider. Make sure you discuss any questions you have with your health care provider. °  °Document Released: 05/20/2013 Document Revised: 10/22/2013 Document Reviewed: 05/20/2013 °Elsevier Interactive Patient Education ©2016 [employer] ° °

## 2015-11-20 NOTE — Progress Notes (Signed)
Pt being discharged home via wheelchair with family. Pt alert and oriented x4. VSS. Pt c/o no pain at this time. No signs of respiratory distress. Education complete and care plans resolved. Pt sent home with PICC for ABX and IV team flushed and hep locked prior to d/c. No further issues at this time. Pt to follow up with PCP. Leanne Chang, RN

## 2015-11-20 NOTE — Care Management Note (Signed)
Case Management Note  Patient Details  Name: William Fischer MRN: [id number] Date of Birth: 12-15-1965  Subjective/Objective:  Admitted with recurrent abdominal infection since noted ischemic colitis in Dec 2016, s/p  ex lap with hemicolectomy and colostomy placement, c.diff.          PCP: Julious Oka  Action/Plan: Plan is to d/cto home today with home health services (RN). Pt will need home IV antibiotic infusion.  Pt discharging with PICC line, JP drain and colostomy.  Expected Discharge Date:    11/20/2015           Expected Discharge Plan:  [place]  In-House Referral:     Discharge planning Services  CM Consult  Post Acute Care Choice:  Durable Medical Equipment Choice offered to:  Patient  DME Arranged:  IV pump/equipment/ CM made referral with [facility] D[facility] Agency:  [place] Arranged:  RN/ CM made referral with [facility] @ [phone]. [place] Agency:   [facility]  Status of Service:  Completed, signed off  If discussed at [place] of Stay Meetings, dates discussed:    Additional Comments:  Sharin Mons, RN 11/20/2015, 10:39 AM

## 2015-11-20 NOTE — Discharge Summary (Signed)
Name: William Fischer MRN: [id number] DOB: [birth year] 50 y.o. PCP: Norman Herrlich, MD  Date of Admission: 11/17/2015 12:21 PM Date of Discharge: 11/20/2015 Attending Physician: Lucious Groves, DO  Discharge Diagnosis: 1. 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   diltiazem 180 MG 24 hr capsule Commonly known as:  CARDIZEM CD Take 1 capsule (180 mg total) by mouth 2 (two) times daily.   furosemide 40 MG tablet Commonly known as:  LASIX Take 40 mg by mouth daily.   lisinopril 5 MG tablet Commonly known as:  PRINIVIL,ZESTRIL Take 5 mg by mouth daily.   metoprolol 100 MG tablet Commonly known as:  LOPRESSOR TAKE 1 TABLET (100 MG TOTAL) BY MOUTH 2 (TWO) TIMES DAILY.   multivitamin with minerals Tabs tablet Take 1 tablet by mouth daily.   oxyCODONE-acetaminophen 5-325 MG tablet Commonly known as:  ROXICET Take 1-2 tablets by mouth every 6 (six) hours as needed for severe pain.   piperacillin-tazobactam 3.375 GM/50ML IVPB Commonly known as:  ZOSYN Inject 50 mLs (3.375 g total) into the vein every 8 (eight) hours.   rivaroxaban 20 MG Tabs tablet Commonly known as:  XARELTO Take 1 tablet (20 mg total) by mouth daily with supper.   vancomycin 50 mg/mL oral solution Commonly known as:   VANCOCIN Take 2.5 mLs (125 mg total) by mouth 4 (four) times daily.       Disposition and follow-up:   William Fischer was discharged from [facility] in Stable condition.  At the hospital follow up visit please address:  1. - Intraabdominal abscess, recurrent, abscess drain replaced in LLQ with improvement in purulent output, PICC placed for 4 weeks IV Zosyn as abscess culture grew pseudomonas, prescribed limited course of Roxicet for pain control   C.diff colitis - prescribed po Vancomycin for 8 weeks  2.  Labs / imaging needed at time of follow-up: CBC, BMP  3.  Pending labs/ test needing follow-up: blood cultures drawn 10/31. Abscess culture collected 11/1  Follow-up Appointments: Follow-up Information    Rosario Adie., MD. Daphane Shepherd on [id number].   Specialty:  General Surgery Why:  at 10:10 AM for follow-up from your recent hospitalization and surgical planning. please arrive 15 minutes early. Contact information: [address] [place] [address] [phone]        Inc[facility] - Dme [facility] .   Why:  IV pump arranged for IV antibiotic infusion Contact information: [place] [address] [place] .   Why:  Home Health RN arranged Contact information: [address] [place] [address] [phone]        [facility]. Go on 11/27/2015.  Why:  At 2:15, hospital follow up appointment to assess pain and symptoms Contact information: [address] [place] ON:[id number]       Scharlene Gloss, MD. Schedule an appointment as soon as possible for a visit today.   Specialty:  Infectious Diseases Why:  Number provided to ensure that appointment is made within 2-4 weeks Contact information: 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 Intraabdominal abscesses, recurrent William Fischer is a 50 year old male who had an episode of ischemic colitis in Dec 2016 and was taken emergently to the OR for ex lap with hemicolectomy and colostomy placement.  Since that time he has had multiple admissions for recurrent abdominal infections and abscesses that have not allowed his colostomy to be reversed. Despite multiple courses of po and IV antibiotics with good adherence.  In addition his course has been complicated by C diff colitis. Recent imaging confirmed a slow leak of his Hartmann stump that seems to be re-seeding these abscesses whenever antibiotics are stopped.  He presented on 10/31 similarly to past presentations with 3 weeks of malaise, increased colostomy output for 5 days, fever, chills and abdominal pain. His IR drain has stopped several weeks early but resumed as his symptoms worsened. CT A/P revealed abscess recurrence and he was started on IV Zosyn. C diff antigen and toxin + and started on po vancomycin. Not an emergency surgical candidate, surgery recommended drain and IV abx. IR drain was felt to have potentially slipped out of place, and was exchanged on 11/1 by Dr. Earleen Newport and abscess fluid was cultured and revealed some pseudomonas. Infectious disease consult on 11/2 with Dr. Linus Salmons recommended 6 weeks IV Zosyn and 8 weeks po Vancomycin. He had a PICC line placed on 11/3 and was stable for discharge. His antibiotics very likely need to be continued up until surgery as he has a confirmed stump leak. He is tentatively planned for surgical evaluation with Dr. Donne Hazel and Dr. Leighton Ruff to have colostomy reversal, bowel resection, and abdominal hernia repair when his abscesses have cleared and the  tissues are healthy enough for surgery. This was planned for mid November but has been again delayed by abscess recurrence. His pain improved with drainage and antibiotics and he was provided with a short course of percocet.  C. Diff colitis, recurrent, antigen and toxin positive by night of admission, started on po Vancomycin QID, will need to continue for 8 weeks.  Atrial fibrillation, Xarelto held on admission and restarted after IR drain replacement and PICC line placement, no issues with bleeding during hospital stay. Home Diltiazem, and Metoprolol continued throughout stay.  Discharge Vitals:   BP 103/69 (BP Location: Left Arm)   Pulse 83   Temp 97.7 F (36.5 C) (Oral)   Resp 18   Ht 5\' 11"  (1.803 m)   Wt 122.2 kg (269 lb 6.4 oz)   SpO2 96%   BMI 37.57 kg/m   Pertinent Labs, Studies, and Procedures:  CBC Latest Ref Rng & Units 11/20/2015 11/19/2015 11/18/2015  WBC 4.0 - 10.5 K/uL 14.2(H) 17.7(H) 13.2(H)  Hemoglobin 13.0 - 17.0 g/dL 13.6 13.4 13.8  Hematocrit 39.0 -  52.0 % 41.3 40.6 41.0  Platelets 150 - 400 K/uL 447(H) 465(H) 397   BMP Latest Ref Rng & Units 11/20/2015 11/19/2015 11/18/2015  Glucose 65 - 99 mg/dL 102(H) 155(H) 224(H)  BUN 6 - 20 mg/dL 19 23(H) 22(H)  Creatinine 0.61 - 1.24 mg/dL 0.87 0.90 0.98  Sodium 135 - 145 mmol/L 136 136 134(L)  Potassium 3.5 - 5.1 mmol/L 4.5 4.5 5.4(H)  Chloride 101 - 111 mmol/L 102 102 103  CO2 22 - 32 mmol/L 28 27 23   Calcium 8.9 - 10.3 mg/dL 8.7(L) 8.9 9.0   Aerobic/Anaerobic Culture (surgical/deep wound)  Order: [id number]  Status:  Final result Visible to patient:  No (Not Released) Next appt:  11/27/2015 at 02:15 PM in Internal Medicine [facility] [place])   8d ago  Specimen Description ABSCESS ABDOMEN   Special Requests NONE   Gram Stain ABUNDANT WBC PRESENT, PREDOMINANTLY PMN  MODERATE GRAM POSITIVE COCCI IN CLUSTERS  MODERATE GRAM NEGATIVE RODS  MODERATE GRAM NEGATIVE COCCOBACILLI  FEW GRAM POSITIVE RODS  FEW  GRAM POSITIVE COCCI IN PAIRS       Culture MODERATE PSEUDOMONAS AERUGINOSA MIXED ANAEROBIC FLORA PRESENT. CALL LAB IF FURTHER IID REQUIRED.   Report Status 11/25/2015 FINAL   Organism ID, Bacteria PSEUDOMONAS AERUGINOSA   Resulting Agency [facility]  Susceptibility    Pseudomonas aeruginosa    MIC    CEFEPIME 4 SENSITIVE  Sensitive    CEFTAZIDIME 4 SENSITIVE  Sensitive    CIPROFLOXACIN <=0.25 SENSITIVE "><=0.25 SENS... Sensitive    GENTAMICIN <=1 SENSITIVE "><=1 SENSITIVE  Sensitive    IMIPENEM 2 SENSITIVE  Sensitive    PIP/TAZO 8 SENSITIVE  Sensitive            11/17/15 - CT A/P w contrast IMPRESSION: Reaccumulation of retroperitoneal fluid collection in the left posterior pararenal space surrounding the percutaneous drainage catheter, which extends superiorly into the left posterior subdiaphragmatic region. Mild increase in small left pleural effusion left basilar atelectasis.  Stable small fluid collection in left lower quadrant adjacent to Hartmann's pouch anastomosis, with associated fistulous tracts. Small fluid collection in the left paracolic gutter remains stable, while there has been resolution of fluid collection in the left abdominal wall subcutaneous tissues.  Stable large ventral abdominal wall hernia containing small bowel and colon. No evidence of bowel obstruction.   11/18/15 Procedure - Left retroperitoneal abscess tube injection and exchange with new 47F pigtail catheter. Gravity drains, BID drain flushes, do not submerge, routine drain care.   Discharge Instructions: Discharge Instructions    Call MD for:  difficulty breathing, headache or visual disturbances    Complete by:  As directed    Call MD for:  extreme fatigue    Complete by:  As directed    Call MD for:  persistant dizziness or light-headedness    Complete by:  As directed    Call MD for:  persistant nausea and vomiting    Complete by:  As directed    Call MD for:  redness, tenderness,  or signs of infection (pain, swelling, redness, odor or green/yellow discharge around incision site)    Complete by:  As directed    Call MD for:  severe uncontrolled pain    Complete by:  As directed    Call MD for:  temperature >100.4    Complete by:  As directed    Diet - low sodium heart healthy    Complete by:  As directed    Discharge instructions  Complete by:  As directed    Please continue to take your medications as prescribed. Work with home care staff to ensure that you receive your IV antibiotic (Zosyn) every 8 hours, as well as your oral antibiotic (Vancomycin) four times daily. You need IV antibiotics for at least 4 weeks and the oral antibiotic for 8 weeks. This will help your body clear the infection and abscesses, and prevent leakage and re-accumulation in the future. Once the infection is taken care of and the inflammation in your abdomen has gone down, your tissues should heal and be strong and stable enough to have a safe planned surgery. It is also important to monitor and maintain your abdominal drain and maintain good hygiene with bandage changes over any other draining sites. If you develop bleeding, worsening abdominal pain, fevers, loss of drain output please return to the ER so that we can assess for worsening infection or misplaced drain.   It is very important that you follow up with [place] in the internal medicine clinic, surgery, and the infectious disease doctors in the near future.   Increase activity slowly    Complete by:  As directed       Signed: [name], MD 11/20/2015, 11:59 AM   Pager: [phone]

## 2015-11-20 NOTE — Progress Notes (Signed)
[facility] Progress Note     Subjective: Abdominal pain improving. Denies fever, chills, SOB. Tolerating diet. Denies N/V. Ambulating in room.  Still c/o large amount of drainage from previous LLQ drain site - states a pouch has failed in the past because it will not stay sealed to his skin.  Drain output: 30 cc/24 h and purulent  Objective: Vital signs in last 24 hours: Temp:  [97.7 F (36.5 C)-97.8 F (36.6 C)] 97.7 F (36.5 C) (11/03 0620) Pulse Rate:  [83-90] 83 (11/03 0620) Resp:  [18-19] 18 (11/03 0620) BP: (103-115)/(62-72) 103/69 (11/03 0620) SpO2:  [96 %-99 %] 96 % (11/03 0620) Weight:  [269 lb 6.4 oz (122.2 kg)-271 lb 2.7 oz (123 kg)] 269 lb 6.4 oz (122.2 kg) (11/03 0620) Last BM Date: 11/19/15  Intake/Output from previous day: 11/02 0701 - 11/03 0700 In: 1130 [P.O.:780; IV Piggyback:350] Out: 980 [Urine:750; Drains:30; Stool:200] Intake/Output this shift: No intake/output data recorded.  PE: Gen:  Alert, NAD, pleasant and cooperative Pulm:  CTA, no W/R/R Abd: Soft, obese, NT/ND, +BS, left IR drain with minimal purulent drainage, previous laparotomy scar noted  Lab Results:   Recent Labs  11/19/15 0624 11/20/15 0440  WBC 17.7* 14.2*  HGB 13.4 13.6  HCT 40.6 41.3  PLT 465* 447*   BMET  Recent Labs  11/19/15 0624 11/20/15 0440  NA 136 136  K 4.5 4.5  CL 102 102  CO2 27 28  GLUCOSE 155* 102*  BUN 23* 19  CREATININE 0.90 0.87  CALCIUM 8.9 8.7*   PT/INR No results for input(s): LABPROT, INR in the last 72 hours. CMP     Component Value Date/Time   NA 136 11/20/2015 0440   K 4.5 11/20/2015 0440   CL 102 11/20/2015 0440   CO2 28 11/20/2015 0440   GLUCOSE 102 (H) 11/20/2015 0440   BUN 19 11/20/2015 0440   CREATININE 0.87 11/20/2015 0440   CALCIUM 8.7 (L) 11/20/2015 0440   PROT 6.9 11/18/2015 0709   ALBUMIN 2.4 (L) 11/18/2015 0709   AST 20 11/18/2015 0709   ALT 33 11/18/2015 0709   ALKPHOS 96 11/18/2015 0709   BILITOT 0.3  11/18/2015 0709   GFRNONAA >60 11/20/2015 0440   GFRAA >60 11/20/2015 0440   Lipase     Component Value Date/Time   LIPASE 25 10/23/2015 1149   Studies/Results: Ir Catheter Tube Change  Result Date: 11/18/2015 INDICATION: 50 year old male with history of emergent hemicolectomy and colostomy 01/17/2015 secondary to perforation. He has had ongoing difficulty with peritoneal contamination with multiple drainage tubes place. Most currently he has a single percutaneous drainage catheter placed into retroperitoneal abscess September 30, 2015 adjacent to the psoas muscle. Most recent CT demonstrates persisting fluid adjacent to the catheter. He presents for evaluation of possible exchange. EXAM: IR CATHETER TUBE CHANGE MEDICATIONS: The patient is currently admitted to the hospital and receiving intravenous antibiotics. The antibiotics were administered within an appropriate time frame prior to the initiation of the procedure. ANESTHESIA/SEDATION: None COMPLICATIONS: None PROCEDURE: Informed written consent was obtained from the patient after a thorough discussion of the procedural risks, benefits and alternatives. All questions were addressed. Maximal Sterile Barrier Technique was utilized including caps, mask, sterile gowns, sterile gloves, sterile drape, hand hygiene and skin antiseptic. A timeout was performed prior to the initiation of the procedure. Patient is position prone position on the fluoroscopy table. Scout images of the abdomen were performed. 1% lidocaine was used for local anesthesia at the skin site. Gentle  injection through the existing tube was performed demonstrating residual abscess cavity within the left retroperitoneum. Catheter was ligated in using modified Seldinger technique, a new 42 French pigtail catheter was advanced into the collection. Once the pigtail catheter was formed within the cavity, a sample was aspirated for culture. Approximately 300 cc of normal saline was used for  irrigation at the catheter tip. Final image was stored and the catheter was sutured in position. Patient tolerated the procedure well and remained hemodynamically stable throughout. No complications were encountered and no significant blood loss encountered. IMPRESSION: Status post left retroperitoneal drain injection and exchange with placement of a new 14 French pigtail catheter. Sample was sent for analysis. Signed, Dulcy Fanny. Earleen Newport, DO [facility] Radiology Electronically Signed   By: Corrie Mckusick D.O.   On: 11/18/2015 12:45   Dg Chest Port 1 View  Result Date: 11/19/2015 CLINICAL DATA:  Initial evaluation for line placement. EXAM: PORTABLE CHEST 1 VIEW COMPARISON:  Prior radiograph from 11/17/2015. FINDINGS: There has been interval placement of a right PICC catheter with tip overlying the distal SVC. Cardiomegaly is stable. Mediastinal silhouette within normal limits. Lungs normally inflated. Patchy left basilar opacity is similar to previous. Vascular congestion without overt pulmonary edema. Possible small left pleural effusion. No new focal airspace disease. No pneumothorax. Osseous structures unchanged. IMPRESSION: 1. Tip of right PICC catheter overlying the distal SVC. 2. Persistent left basilar opacity, either atelectasis or infiltrate. 3. Probable small left pleural effusion. 4. Stable cardiomegaly with mild pulmonary vascular congestion without overt pulmonary edema. Electronically Signed   By: Jeannine Boga M.D.   On: 11/19/2015 19:31    Anti-infectives: Anti-infectives    Start     Dose/Rate Route Frequency Ordered Stop   11/18/15 1000  vancomycin (VANCOCIN) 50 mg/mL oral solution 125 mg     125 mg Oral 4 times daily 11/18/15 0852 12/02/15 0959   11/17/15 2300  piperacillin-tazobactam (ZOSYN) IVPB 3.375 g     3.375 g 12.5 mL/hr over 240 Minutes Intravenous Every 8 hours 11/17/15 2202     11/17/15 1430  piperacillin-tazobactam  (ZOSYN) IVPB 3.375 g     3.375 g 100 mL/hr over 30 Minutes Intravenous  Once 11/17/15 1421 11/17/15 1622     Assessment/Plan Recurring/persistentretroperitoneal abscess S/p emergent hemicolectomy with colostomy 01/17/15, Rolm Bookbinder Fluid collections left retroperitoneum/LLQ adjacent to Hartman's pouch secondary to leak of his rectal stump Now complicated by C. difficile colitis             11/18/15: IR exchanged left retroperitoneal abscess tube with a new 88F pigtail catheter             appreciate ID recs: Zosyn q 8h x 4 weeks; PO vanc x 8 weeks  PICC line placed 11/19/15 for prolonged abx             WBC 14.2 from 17.7 yesterday; electrolytes stable             OOB to chair/ambulate              May shower daily   History of atrial fibrillation with chronic coagulation/Xarelto last dose 11/16/15 Cardiomyopathy with a history of chronic systolic heart failure Obesity Situational depression and anxiety History tobacco use  FEN: full liquid diet ID: Zosyn 10/31 >>, PO vancomycin for c.dif  VTE: SCD's  Dispo: IV abx per PICC - stable for discharge from surgical standpoint. Will need home health for abx/drain care Follow up with general  surgery, Leighton Ruff, in 2 weeks for re-evaluation.  Patient is tentatively scheduled for open reversal of colostomy with partial colectomy and incisional hernia repair 12/06/25 (Dr. Marcello Moores, Dr. Donne Hazel); I have discussed with the patient that it is possible/likely that this procedure will have to be delayed.  Follow-up information and discharge instructions provided. General surgery will sign off. Call with questions/concerns.   LOS: 3 days    Jill Alexanders , [facility] Surgery 11/20/2015, 8:14 AM Pager: [phone] Consults: [phone] Mon-Fri 7:00 am-4:30 pm Sat-Sun 7:00 am-11:30 am

## 2015-11-20 NOTE — Progress Notes (Signed)
Subjective: William Fischer is feeling much better today. Mild lower abdominal abdominal pain persists. LLQ drain maintaining adequate output, also draining some from more-medial wound at previous drain site. No surrounding erythema, skin changes, or tenderness to palpation. Patient is interested in discharge today and very interested in reaching surgery in the future.   Objective: Vital signs in last 24 hours: Vitals:   11/19/15 0900 11/19/15 0950 11/19/15 2118 11/20/15 0620  BP: 112/62 112/62 115/72 103/69  Pulse:  83 90 83  Resp:   19 18  Temp:  97.8 F (36.6 C) 97.7 F (36.5 C) 97.7 F (36.5 C)  TempSrc:  Oral Oral Oral  SpO2:  98% 99% 96%  Weight:  123 kg (271 lb 2.7 oz)  122.2 kg (269 lb 6.4 oz)  Height:        Intake/Output Summary (Last 24 hours) at 11/20/15 0703 Last data filed at 11/20/15 [id number]  Gross per 24 hour  Intake             1130 ml  Output              980 ml  Net              150 ml    Physical Exam General appearance: Obese gentleman, tired-appearing Cardiovascular: Regular rate and rhythm, no murmurs, rubs, gallops appreciated Respiratory/Chest: Clear to ausculation bilaterally, normal work of breathing Abdomen: Large healed vertical scar, mild tenderness to palpation over lower abdomen, bandaged drain site LLQ c/d/i, purulent fluid visible in tubing and pouch, fluid also draining from medial LLQ wound at previous drain-site, non-erythematous, RUQ colostomy intact with brown stool, bowel sounds present, soft Skin: Warm, dry, intact Psych: Positive affect  Labs / Imaging / Procedures: CBC Latest Ref Rng & Units 11/20/2015 11/19/2015 11/18/2015  WBC 4.0 - 10.5 K/uL 14.2(H) 17.7(H) 13.2(H)  Hemoglobin 13.0 - 17.0 g/dL 13.6 13.4 13.8  Hematocrit 39.0 - 52.0 % 41.3 40.6 41.0  Platelets 150 - 400 K/uL 447(H) 465(H) 397   BMP Latest Ref Rng & Units 11/20/2015 11/19/2015 11/18/2015  Glucose 65 - 99 mg/dL 102(H) 155(H) 224(H)  BUN 6 - 20 mg/dL 19 23(H) 22(H)    Creatinine 0.61 - 1.24 mg/dL 0.87 0.90 0.98  Sodium 135 - 145 mmol/L 136 136 134(L)  Potassium 3.5 - 5.1 mmol/L 4.5 4.5 5.4(H)  Chloride 101 - 111 mmol/L 102 102 103  CO2 22 - 32 mmol/L 28 27 23   Calcium 8.9 - 10.3 mg/dL 8.7(L) 8.9 9.0   Aerobic/Anaerobic Culture (surgical/deep wound)    1d ago  Specimen Description ABSCESS ABDOMEN   Special Requests NONE   Gram Stain ABUNDANT WBC PRESENT, PREDOMINANTLY PMN  MODERATE GRAM POSITIVE COCCI IN CLUSTERS  MODERATE GRAM NEGATIVE RODS  MODERATE GRAM NEGATIVE COCCOBACILLI  FEW GRAM POSITIVE RODS  FEW GRAM POSITIVE COCCI IN PAIRS       Culture FEW PSEUDOMONAS AERUGINOSA   Report Status PENDING   Resulting Agency [facility]        Assessment/Plan: William Fischer is a 50 y.o. gentleman with PMH afib, cardiomyopathy, recurrent abdominal abscesses presenting with same.   Recurrent retroperitoneal fluid abscesses secondary to slow leak from [facility] pouch stump:  Patient who underwent left hemicolectomy in Dec [id number], very complicated post-op course, recurrent abscesses despite multiple courses of antibiotics and repeated drain placements via IR. Presents again with fevers, purulent drainage from JP drain and change in colostomy output, and abd pain. CT showing reaccumulation of retroperitoneal fluid collection in  the left posterior pararenal space, and he also has large ventral hernia. Surgery consulted, and started zosyn. Pain not controlled on just fentanyl this AM. S/p drain exchange via IR on 11/1. Preliminary culture results show Pseudomonas aeruginosa - R brachial PICC line placed successfully yesterday - Appreciate general surgery recs, follow up on 11/13, surgery may be delayed while abscess recedes and tissues heal - Appreciate ID recs - will continue IV Zosyn for 4 weeks and po Vanc for 8 weeks, reassess continuation of antibiotics at ID follow up - IV Zosyn 3.375g Q8 - Norco 5-325 1-2 tablets Q4 PRN for pain - Fentanyl 50 mcg Q2h  PRN for pain - Zofran PRN for nausea - Follow culture results/sensitivities  C. Diff colitis, antigen and toxin positive - po Vancomycin QID for 8 weeks - Enteric precautions  Afib and cardiomyopathy - Continue home digoxin, lisinopril, diltiazem, lasix, and metoprolol - Resume home Xarelto after PICC line placement  Case management consult - Home health for PICC / IV antibiotic supplies and instruction - Investigate colostomy bag supply concern, patient reports [employer]BS will not cover these  Dispo: Anticipated discharge today. .   LOS: 3 days   [name], MD 11/20/2015, 7:03 AM Pager: [phone]

## 2015-11-20 NOTE — Progress Notes (Signed)
Pharmacy Antibiotic Note  50 y.o. male admitted 11/17/2015 continues on ABX for recurrent abdominal abscesses. Now complicated by Cdiff colitis. To IR 11/1 for drain exchange - no role for acute surgical intervention per surgery. Abdominal abscess culture with pseudomonas (full Cx report below). ID now following - PICC placement with Zosyn x 4wks and PO vanc x2 months. Remains afebrile, WBC 17.7>14.2. AKI resolved; SCr currently 0.89 and stable.  Plan: -Continue zosyn 3.375g q8h -Continue vancomycin 125mg  PO QID -Follow up C/S -Monitor renal fxn, clinical course  Height: 5\' 11"  (180.3 cm) Weight: 269 lb 6.4 oz (122.2 kg) IBW/kg (Calculated) : 75.3  Temp (24hrs), Avg:97.7 F (36.5 C), Min:97.7 F (36.5 C), Max:97.7 F (36.5 C)   Recent Labs Lab 11/17/15 1201 11/17/15 1228 11/17/15 1329 11/17/15 1446 11/17/15 1506 11/18/15 0709 11/19/15 0624 11/20/15 0440  WBC 14.2*  --   --   --   --  13.2* 17.7* 14.2*  CREATININE  --   --  1.20 1.27*  --  0.98 0.90 0.87  LATICACIDVEN  --  1.63  --   --  1.37  --   --   --     Estimated Creatinine Clearance: 138.2 mL/min (by C-G formula based on SCr of 0.87 mg/dL).    Allergies  Allergen Reactions  . Contrast Media [Iodinated Diagnostic Agents] Rash    a diffuse macular rash after CTA chest  Pt was premedicated with 125mg  IV Solumedrol, 50mg  IV Benadryl 1 hr prior to CTexam, and tolerated procedure without any difficulties. 11/28/11 Also same pre med protocol observed on 02/22/15 and pt tolerated the procedure well    Antimicrobials this admission:  Zosyn 10/31 >>  Vanc PO 11/1 >>   Microbiology results:  10/31 blood x 2 - NG x2 days 11/1 abdominal abscess: moderate GPC clusters, GNR, GN coccobacilli; few GPR, GPC pairs, pseudomonas 10/31 urine: insig growth - sent 10/31 C diff positive - recurrent   Thank you for allowing pharmacy to be a part of this patient's care.  Stephens November, PharmD Clinical Pharmacist 10:45 AM,  11/20/2015

## 2015-11-21 DIAGNOSIS — A419 Sepsis, unspecified organism: Secondary | ICD-10-CM | POA: Diagnosis not present

## 2015-11-22 LAB — CULTURE, BLOOD (ROUTINE X 2)
CULTURE: NO GROWTH
Culture: NO GROWTH

## 2015-11-24 DIAGNOSIS — A0472 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, not specified as recurrent: Secondary | ICD-10-CM | POA: Diagnosis not present

## 2015-11-24 DIAGNOSIS — Z452 Encounter for adjustment and management of vascular access device: Secondary | ICD-10-CM | POA: Diagnosis not present

## 2015-11-24 DIAGNOSIS — A419 Sepsis, unspecified organism: Secondary | ICD-10-CM | POA: Diagnosis not present

## 2015-11-24 DIAGNOSIS — B9620 Unspecified Escherichia coli [E. coli] as the cause of diseases classified elsewhere: Secondary | ICD-10-CM | POA: Diagnosis not present

## 2015-11-25 LAB — AEROBIC/ANAEROBIC CULTURE (SURGICAL/DEEP WOUND)

## 2015-11-25 LAB — AEROBIC/ANAEROBIC CULTURE W GRAM STAIN (SURGICAL/DEEP WOUND)

## 2015-11-26 ENCOUNTER — Telehealth: Payer: Self-pay

## 2015-11-26 ENCOUNTER — Telehealth: Payer: Self-pay | Admitting: Internal Medicine

## 2015-11-26 NOTE — Telephone Encounter (Signed)
APT. REMINDER CALL, NO ANSWER, NO VOICEMAIL °

## 2015-11-27 ENCOUNTER — Ambulatory Visit: Payer: BLUE CROSS/BLUE SHIELD | Admitting: Internal Medicine

## 2015-11-29 NOTE — Progress Notes (Signed)
Unable to reach patient.

## 2015-11-30 DIAGNOSIS — K6819 Other retroperitoneal abscess: Secondary | ICD-10-CM | POA: Diagnosis not present

## 2015-11-30 DIAGNOSIS — A419 Sepsis, unspecified organism: Secondary | ICD-10-CM | POA: Diagnosis not present

## 2015-11-30 DIAGNOSIS — A0471 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, recurrent: Secondary | ICD-10-CM | POA: Diagnosis not present

## 2015-11-30 DIAGNOSIS — K651 Peritoneal abscess: Secondary | ICD-10-CM | POA: Diagnosis not present

## 2015-12-02 ENCOUNTER — Encounter (HOSPITAL_COMMUNITY)
Admission: RE | Admit: 2015-12-02 | Discharge: 2015-12-02 | Disposition: A | Discharge status: Home / Self Care | Payer: BLUE CROSS/BLUE SHIELD | Source: Ambulatory Visit | Attending: General Surgery | Admitting: General Surgery

## 2015-12-02 ENCOUNTER — Encounter (HOSPITAL_COMMUNITY): Payer: Self-pay

## 2015-12-02 DIAGNOSIS — Z01818 Encounter for other preprocedural examination: Secondary | ICD-10-CM | POA: Diagnosis not present

## 2015-12-02 HISTORY — DX: Acute (reversible) ischemia of intestine, part and extent unspecified: K55.059

## 2015-12-02 HISTORY — DX: Essential (primary) hypertension: I10

## 2015-12-02 LAB — CBC
HEMATOCRIT: 45.9 % (ref 39.0–52.0)
HEMOGLOBIN: 15.6 g/dL (ref 13.0–17.0)
MCH: 30.4 pg (ref 26.0–34.0)
MCHC: 34 g/dL (ref 30.0–36.0)
MCV: 89.5 fL (ref 78.0–100.0)
Platelets: 424 10*3/uL — ABNORMAL HIGH (ref 150–400)
RBC: 5.13 MIL/uL (ref 4.22–5.81)
RDW: 15.6 % — ABNORMAL HIGH (ref 11.5–15.5)
WBC: 10.2 10*3/uL (ref 4.0–10.5)

## 2015-12-02 LAB — BASIC METABOLIC PANEL
Anion gap: 9 (ref 5–15)
BUN: 15 mg/dL (ref 6–20)
CHLORIDE: 103 mmol/L (ref 101–111)
CO2: 24 mmol/L (ref 22–32)
CREATININE: 1.08 mg/dL (ref 0.61–1.24)
Calcium: 10 mg/dL (ref 8.9–10.3)
GFR calc Af Amer: >60 mL/min (ref >60)
GFR calc non Af Amer: >60 mL/min (ref >60)
GLUCOSE: 106 mg/dL — AB (ref 65–99)
Potassium: 4.7 mmol/L (ref 3.5–5.1)
SODIUM: 136 mmol/L (ref 135–145)

## 2015-12-02 LAB — TYPE AND SCREEN
ABO/RH(D): O POS
Antibody Screen: NEGATIVE

## 2015-12-02 NOTE — Pre-Procedure Instructions (Signed)
CARDEL WERNETTE  12/02/2015      [id number] - [place], [place] - [address] [place] [address] Phone: [phone] Fax: [phone]    Your procedure is scheduled on 12/07/2015  Report to [facility] Admitting at 7:00A.M.  Call this number if you have problems the morning of surgery:  [phone]   Remember:  Do not eat food or drink liquids after midnight.  On Sunday EVENING  Take these medicines the morning of surgery with A SIP OF WATER: Metoprolol, Diltiazem, Digoxin, in addition if needed you  can take Tylenol  NO XARELTO on 11/19 or 11/20  NO ANTIBIOTIC on the morning of 11/20    Do not wear jewelry   Do not wear lotions, powders, or perfumes, or deoderant.     Men may shave face and neck.   Do not bring valuables to the hospital.   [facility] is not responsible for any belongings or valuables.  Contacts, dentures or bridgework may not be worn into surgery.  Leave your suitcase in the car.  After surgery it may be brought to your room.  For patients admitted to the hospital, discharge time will be determined by your treatment team.  Patients discharged the day of surgery will not be allowed to drive home.   Name and phone number of your driver:   Family  Special instructions:  Special Instructions: [facility] - Preparing for Surgery  Before surgery, you can play an important role.  Because skin is not sterile, your skin needs to be as free of germs as possible.  You can reduce the number of germs on you skin by washing with CHG (chlorahexidine gluconate) soap before surgery.  CHG is an antiseptic cleaner which kills germs and bonds with the skin to continue killing germs even after washing.  Please DO NOT use if you have an allergy to CHG or antibacterial soaps.  If your skin becomes reddened/irritated stop using the CHG and inform your nurse when you arrive at Short Stay.  Do not shave  (including legs and underarms) for at least 48 hours prior to the first CHG shower.  You may shave your face.  Please follow these instructions carefully:   1.  Shower with CHG Soap the night before surgery and the  morning of Surgery.  2.  If you choose to wash your hair, wash your hair first as usual with your  normal shampoo.  3.  After you shampoo, rinse your hair and body thoroughly to remove the  Shampoo.  4.  Use CHG as you would any other liquid soap.  You can apply chg directly to the skin and wash gently with scrungie or a clean washcloth.  5.  Apply the CHG Soap to your body ONLY FROM THE NECK DOWN.    Do not use on open wounds or open sores.  Avoid contact with your eyes, ears, mouth and genitals (private parts).  Wash genitals (private parts)   with your normal soap.  6.  Wash thoroughly, paying special attention to the area where your surgery will be performed.  7.  Thoroughly rinse your body with warm water from the neck down.  8.  DO NOT shower/wash with your normal soap after using and rinsing off   the CHG Soap.  9.  Pat yourself dry with a clean towel.  10.  Wear clean pajamas.            11.  Place clean sheets on your bed the night of your first shower and do not sleep with pets.  Day of Surgery  Do not apply any lotions/deodorants the morning of surgery.  Please wear clean clothes to the hospital/surgery center.  Please read over the following fact sheets that you were given. Pain Booklet, Coughing and Deep Breathing and Surgical Site Infection Prevention

## 2015-12-02 NOTE — Progress Notes (Signed)
Pt. Reports that the Xarelto is to be held for 2 days prior to surgery, verified by reviewing by Dr. Jackalyn Lombard office note.  Pt. Denies all chest concerns today. Pt. Has PICC line /w q 8hrs. Antibiotics  That he is administering to self.  Pt. Has had several  cardioversions, but currently being rate controlled medically. Pt. Informed of the importance of using medicines as prescribed.

## 2015-12-03 ENCOUNTER — Other Ambulatory Visit: Payer: Self-pay | Admitting: *Deleted

## 2015-12-03 DIAGNOSIS — I482 Chronic atrial fibrillation, unspecified: Secondary | ICD-10-CM

## 2015-12-03 LAB — HEMOGLOBIN A1C
HEMOGLOBIN A1C: 6.7 % — AB (ref 4.8–5.6)
MEAN PLASMA GLUCOSE: 146 mg/dL

## 2015-12-03 MED ORDER — DILTIAZEM HCL ER COATED BEADS 180 MG PO CP24
180.0000 mg | ORAL_CAPSULE | Freq: Two times a day (BID) | ORAL | 10 refills | Status: DC
Start: 2015-12-03 — End: 2016-11-02

## 2015-12-03 NOTE — Progress Notes (Signed)
Anesthesia chart review: Patient is a 50 year old male scheduled for open colostomy reversal with partial colon resection, hernia repair, possible mesh placement on [id number] by Dr. Leighton Ruff.  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 ID consulted (Dr. Linus Salmons). RUE PICC placed 11/19/15. BMI is consistent with obesity.   PCP is listed as Dr. Gwendalyn Ege. Cardiologist is Dr. Rayann Heman. Per 10/22/15 telephone encounter by Madilyn Fireman, "Per Dr Rayann Heman proceed if medically indicated.  Would need Lovenox bridging while off Coumadin." Then correction made 10/28/15, "No bridging necessary for Xarelto" (since he is on Xarelto and not Coumadin).   Meds include Xarelto (holding two days prior to surgery), digoxin, Cardizem CD, Lasix, lisinopril, Lopressor, Zosyn, vancomycin.  BP (!) 109/54   Pulse (!) 111   Temp 36.7 C   Resp 18   Ht 5' 10.5" (1.791 m)   Wt 262 lb 6.4 oz (119 kg)   SpO2 96%   BMI 37.12 kg/m  Unfortunately HR was not rechecked at PAT (during his recent admission it it appeared that his afib was overall rate controlled; 82-90 by discharge).   11/04/15 EKG: Afib at 65 bpm, low voltage QRS, cannot rule  out anterior infarct (age undetermined).  01/16/15 Echo: Study Conclusions - Left ventricle: EF hard to judge due to rapid afib. Poor image   quality abnormal septal motion The cavity size was normal. Wall   thickness was normal. Systolic function was mildly reduced. The   estimated ejection fraction was in the range of 45% to 50%. - Mitral valve: There was mild regurgitation. - Left atrium: The atrium was moderately dilated. - Atrial septum: No defect or patent foramen ovale was identified. - Pericardium, extracardiac: Small lateral pericardial effusion - Impressions: Consider TEE if clinically indicated. ? lipomatous   hypertrophy of atrial septum cannot r/o mass in RA but suspect   this is off axis artifact. Impressions: - Consider TEE if clinically indicated. ? lipomatous hypertrophy of   atrial septum cannot r/o mass in RA but suspect this is off axis   artifact.  11/19/15 1V CXR: IMPRESSION: 1. Tip of right PICC catheter overlying the distal SVC. 2. Persistent left basilar opacity, either atelectasis or infiltrate. 3. Probable small left pleural effusion. 4. Stable cardiomegaly with mild pulmonary vascular congestion without overt pulmonary edema.  Preoperative labs noted. T&S done. A1c 6.7, suggestive of diabetes (although no history reported). I have routed result to Dr. Marcello Moores and notified the DM education team so they can follow-up while he is hospitalized. I will order a fasting CBG for arrival the day of surgery.   He will get vitals on arrival--he was instructed to take digoxin, metoprolol, and diltiazem on the morning of surgery. Further evaluation by his surgeon and anesthesiologist on the day of surgery.  George Hugh [facility] Short Stay Center/Anesthesiology Phone [phone] 12/03/2015 11:06 AM

## 2015-12-04 DIAGNOSIS — R6521 Severe sepsis with septic shock: Secondary | ICD-10-CM | POA: Diagnosis not present

## 2015-12-04 DIAGNOSIS — K651 Peritoneal abscess: Secondary | ICD-10-CM | POA: Diagnosis not present

## 2015-12-06 MED ORDER — ACETAMINOPHEN 500 MG PO TABS
1000.0000 mg | ORAL_TABLET | ORAL | Status: AC
  Administered 2015-12-07: 1000 mg via ORAL
  Filled 2015-12-06: qty 2

## 2015-12-06 MED ORDER — DEXTROSE 5 % IV SOLN
2.0000 g | INTRAVENOUS | Status: AC
  Administered 2015-12-07: 2 g via INTRAVENOUS
  Filled 2015-12-06: qty 2

## 2015-12-06 MED ORDER — ALVIMOPAN 12 MG PO CAPS
12.0000 mg | ORAL_CAPSULE | Freq: Once | ORAL | Status: AC
  Administered 2015-12-07: 12 mg via ORAL
  Filled 2015-12-06: qty 1

## 2015-12-06 MED ORDER — GABAPENTIN 300 MG PO CAPS
300.0000 mg | ORAL_CAPSULE | ORAL | Status: AC
  Administered 2015-12-07: 300 mg via ORAL
  Filled 2015-12-06: qty 1

## 2015-12-06 NOTE — Anesthesia Preprocedure Evaluation (Addendum)
Anesthesia Evaluation  Patient identified by MRN, date of birth, ID band  Reviewed: Allergy & Precautions, H&P , NPO status , Patient's Chart, lab work & pertinent test results, reviewed documented beta blocker date and time   Airway Mallampati: II  TM Distance: >3 FB Neck ROM: Full    Dental no notable dental hx. (+) Upper Dentures, Lower Dentures, Dental Advisory Given   Pulmonary neg pulmonary ROS, former smoker,    Pulmonary exam normal breath sounds clear to auscultation       Cardiovascular hypertension, Pt. on medications and Pt. on home beta blockers + dysrhythmias Atrial Fibrillation  Rhythm:Irregular Rate:Normal     Neuro/Psych  Headaches, negative psych ROS   GI/Hepatic negative GI ROS, Neg liver ROS,   Endo/Other  negative endocrine ROS  Renal/GU negative Renal ROS  negative genitourinary   Musculoskeletal   Abdominal   Peds  Hematology negative hematology ROS (+)   Anesthesia Other Findings   Reproductive/Obstetrics negative OB ROS                            Anesthesia Physical Anesthesia Plan  ASA: III  Anesthesia Plan: General   Post-op Pain Management:    Induction: Intravenous  Airway Management Planned: Oral ETT  Additional Equipment:   Intra-op Plan:   Post-operative Plan: Extubation in OR  Informed Consent: I have reviewed the patients History and Physical, chart, labs and discussed the procedure including the risks, benefits and alternatives for the proposed anesthesia with the patient or authorized representative who has indicated his/her understanding and acceptance.   Dental advisory given  Plan Discussed with: CRNA  Anesthesia Plan Comments:         Anesthesia Quick Evaluation

## 2015-12-07 ENCOUNTER — Inpatient Hospital Stay (HOSPITAL_COMMUNITY): Payer: BLUE CROSS/BLUE SHIELD | Admitting: Vascular Surgery

## 2015-12-07 ENCOUNTER — Inpatient Hospital Stay (HOSPITAL_COMMUNITY)
Admission: RE | Admit: 2015-12-07 | Discharge: 2015-12-15 | DRG: 330 | Disposition: A | Discharge status: Home / Self Care | Payer: BLUE CROSS/BLUE SHIELD | Source: Ambulatory Visit | Attending: General Surgery | Admitting: General Surgery

## 2015-12-07 ENCOUNTER — Encounter (HOSPITAL_COMMUNITY)
Admission: RE | Disposition: A | Discharge status: Home / Self Care | Payer: Self-pay | Source: Ambulatory Visit | Attending: General Surgery

## 2015-12-07 ENCOUNTER — Encounter (HOSPITAL_COMMUNITY): Payer: Self-pay | Admitting: General Practice

## 2015-12-07 DIAGNOSIS — I4891 Unspecified atrial fibrillation: Secondary | ICD-10-CM | POA: Diagnosis not present

## 2015-12-07 DIAGNOSIS — Z87891 Personal history of nicotine dependence: Secondary | ICD-10-CM

## 2015-12-07 DIAGNOSIS — Z7901 Long term (current) use of anticoagulants: Secondary | ICD-10-CM | POA: Diagnosis not present

## 2015-12-07 DIAGNOSIS — K660 Peritoneal adhesions (postprocedural) (postinfection): Secondary | ICD-10-CM | POA: Diagnosis present

## 2015-12-07 DIAGNOSIS — I482 Chronic atrial fibrillation, unspecified: Secondary | ICD-10-CM | POA: Diagnosis present

## 2015-12-07 DIAGNOSIS — I5022 Chronic systolic (congestive) heart failure: Secondary | ICD-10-CM | POA: Diagnosis not present

## 2015-12-07 DIAGNOSIS — Z433 Encounter for attention to colostomy: Secondary | ICD-10-CM

## 2015-12-07 DIAGNOSIS — R7303 Prediabetes: Secondary | ICD-10-CM | POA: Diagnosis not present

## 2015-12-07 DIAGNOSIS — F418 Other specified anxiety disorders: Secondary | ICD-10-CM | POA: Diagnosis not present

## 2015-12-07 DIAGNOSIS — K567 Ileus, unspecified: Secondary | ICD-10-CM | POA: Diagnosis not present

## 2015-12-07 DIAGNOSIS — R109 Unspecified abdominal pain: Secondary | ICD-10-CM | POA: Diagnosis present

## 2015-12-07 DIAGNOSIS — I429 Cardiomyopathy, unspecified: Secondary | ICD-10-CM | POA: Diagnosis not present

## 2015-12-07 DIAGNOSIS — K632 Fistula of intestine: Principal | ICD-10-CM | POA: Diagnosis present

## 2015-12-07 DIAGNOSIS — K432 Incisional hernia without obstruction or gangrene: Secondary | ICD-10-CM | POA: Diagnosis not present

## 2015-12-07 DIAGNOSIS — Z91041 Radiographic dye allergy status: Secondary | ICD-10-CM

## 2015-12-07 DIAGNOSIS — Z452 Encounter for adjustment and management of vascular access device: Secondary | ICD-10-CM | POA: Diagnosis not present

## 2015-12-07 DIAGNOSIS — Z95828 Presence of other vascular implants and grafts: Secondary | ICD-10-CM

## 2015-12-07 DIAGNOSIS — Z6837 Body mass index (BMI) 37.0-37.9, adult: Secondary | ICD-10-CM

## 2015-12-07 DIAGNOSIS — J9811 Atelectasis: Secondary | ICD-10-CM | POA: Diagnosis not present

## 2015-12-07 DIAGNOSIS — D62 Acute posthemorrhagic anemia: Secondary | ICD-10-CM | POA: Diagnosis not present

## 2015-12-07 DIAGNOSIS — I10 Essential (primary) hypertension: Secondary | ICD-10-CM | POA: Diagnosis not present

## 2015-12-07 DIAGNOSIS — Z933 Colostomy status: Secondary | ICD-10-CM

## 2015-12-07 DIAGNOSIS — D125 Benign neoplasm of sigmoid colon: Secondary | ICD-10-CM | POA: Diagnosis not present

## 2015-12-07 HISTORY — PX: INCISIONAL HERNIA REPAIR: SHX193

## 2015-12-07 HISTORY — PX: COLECTOMY: SHX59

## 2015-12-07 HISTORY — PX: COLOSTOMY REVERSAL: SHX5782

## 2015-12-07 LAB — GLUCOSE, CAPILLARY: Glucose-Capillary: 103 mg/dL — ABNORMAL HIGH (ref 65–99)

## 2015-12-07 LAB — MRSA PCR SCREENING: MRSA BY PCR: NEGATIVE

## 2015-12-07 LAB — PROTIME-INR
INR: 1.12
Prothrombin Time: 14.4 seconds (ref 11.4–15.2)

## 2015-12-07 SURGERY — COLOSTOMY REVERSAL
Anesthesia: General | Site: Abdomen

## 2015-12-07 MED ORDER — ENOXAPARIN SODIUM 40 MG/0.4ML ~~LOC~~ SOLN: 40.0000 mg | SUBCUTANEOUS | Status: DC

## 2015-12-07 MED ORDER — ROCURONIUM BROMIDE 100 MG/10ML IV SOLN
INTRAVENOUS | Status: DC | PRN
  Administered 2015-12-07: 20 mg via INTRAVENOUS
  Administered 2015-12-07: 10 mg via INTRAVENOUS
  Administered 2015-12-07: 80 mg via INTRAVENOUS
  Administered 2015-12-07 (×2): 20 mg via INTRAVENOUS

## 2015-12-07 MED ORDER — ONDANSETRON HCL 4 MG/2ML IJ SOLN
INTRAMUSCULAR | Status: AC
  Filled 2015-12-07: qty 2

## 2015-12-07 MED ORDER — MORPHINE SULFATE (PF) 4 MG/ML IV SOLN
4.0000 mg | Freq: Once | INTRAVENOUS | Status: AC
  Administered 2015-12-07: 4 mg via INTRAVENOUS
  Filled 2015-12-07: qty 1

## 2015-12-07 MED ORDER — DILTIAZEM HCL ER COATED BEADS 180 MG PO CP24
180.0000 mg | ORAL_CAPSULE | Freq: Two times a day (BID) | ORAL | Status: DC
  Filled 2015-12-07 (×3): qty 1

## 2015-12-07 MED ORDER — METOPROLOL TARTRATE 100 MG PO TABS
100.0000 mg | ORAL_TABLET | Freq: Two times a day (BID) | ORAL | Status: DC
  Administered 2015-12-07 – 2015-12-08 (×2): 100 mg via ORAL
  Filled 2015-12-07 (×2): qty 1

## 2015-12-07 MED ORDER — NALOXONE HCL 0.4 MG/ML IJ SOLN: 0.4000 mg | INTRAMUSCULAR | Status: DC | PRN

## 2015-12-07 MED ORDER — PROPOFOL 10 MG/ML IV BOLUS
INTRAVENOUS | Status: AC
  Filled 2015-12-07: qty 20

## 2015-12-07 MED ORDER — MIDAZOLAM HCL 5 MG/5ML IJ SOLN
INTRAMUSCULAR | Status: DC | PRN
  Administered 2015-12-07: 2 mg via INTRAVENOUS

## 2015-12-07 MED ORDER — SUCCINYLCHOLINE CHLORIDE 200 MG/10ML IV SOSY
PREFILLED_SYRINGE | INTRAVENOUS | Status: AC
  Filled 2015-12-07: qty 10

## 2015-12-07 MED ORDER — PROPOFOL 10 MG/ML IV BOLUS
INTRAVENOUS | Status: AC
Start: 2015-12-07 — End: 2015-12-07
  Filled 2015-12-07: qty 20

## 2015-12-07 MED ORDER — ROCURONIUM BROMIDE 10 MG/ML (PF) SYRINGE
PREFILLED_SYRINGE | INTRAVENOUS | Status: AC
  Filled 2015-12-07: qty 10

## 2015-12-07 MED ORDER — SODIUM CHLORIDE 0.9 % IR SOLN
Status: DC | PRN
  Administered 2015-12-07: 500 mL

## 2015-12-07 MED ORDER — HYDROMORPHONE HCL 1 MG/ML IJ SOLN
0.2500 mg | INTRAMUSCULAR | Status: DC | PRN
  Administered 2015-12-07 (×4): 0.5 mg via INTRAVENOUS

## 2015-12-07 MED ORDER — ACETAMINOPHEN 500 MG PO TABS
1000.0000 mg | ORAL_TABLET | Freq: Four times a day (QID) | ORAL | Status: AC
  Administered 2015-12-08 (×3): 1000 mg via ORAL
  Filled 2015-12-07 (×4): qty 2

## 2015-12-07 MED ORDER — LISINOPRIL 5 MG PO TABS
5.0000 mg | ORAL_TABLET | Freq: Every day | ORAL | Status: DC
  Administered 2015-12-10: 5 mg via ORAL
  Filled 2015-12-07: qty 1

## 2015-12-07 MED ORDER — SODIUM CHLORIDE 0.9% FLUSH: 9.0000 mL | INTRAVENOUS | Status: DC | PRN

## 2015-12-07 MED ORDER — SUGAMMADEX SODIUM 500 MG/5ML IV SOLN
INTRAVENOUS | Status: AC
  Filled 2015-12-07: qty 5

## 2015-12-07 MED ORDER — CEFOTETAN DISODIUM-DEXTROSE 2-2.08 GM-% IV SOLR
INTRAVENOUS | Status: AC
  Filled 2015-12-07: qty 50

## 2015-12-07 MED ORDER — LACTATED RINGERS IV SOLN
INTRAVENOUS | Status: DC | PRN
  Administered 2015-12-07 (×4): via INTRAVENOUS

## 2015-12-07 MED ORDER — MORPHINE SULFATE 2 MG/ML IV SOLN
INTRAVENOUS | Status: DC
  Administered 2015-12-07: 13:00:00 via INTRAVENOUS
  Administered 2015-12-07: 25.5 mg via INTRAVENOUS
  Administered 2015-12-07: 19:00:00 via INTRAVENOUS
  Administered 2015-12-08: 10.5 mg via INTRAVENOUS
  Administered 2015-12-08: 24 mg via INTRAVENOUS
  Filled 2015-12-07: qty 25

## 2015-12-07 MED ORDER — DIPHENHYDRAMINE HCL 12.5 MG/5ML PO ELIX: 12.5000 mg | ORAL_SOLUTION | Freq: Four times a day (QID) | ORAL | Status: DC | PRN

## 2015-12-07 MED ORDER — ONDANSETRON HCL 4 MG/2ML IJ SOLN
4.0000 mg | Freq: Four times a day (QID) | INTRAMUSCULAR | Status: DC | PRN
  Administered 2015-12-08 – 2015-12-11 (×2): 4 mg via INTRAVENOUS
  Filled 2015-12-07 (×2): qty 2

## 2015-12-07 MED ORDER — ONDANSETRON HCL 4 MG/2ML IJ SOLN
INTRAMUSCULAR | Status: DC | PRN
  Administered 2015-12-07: 4 mg via INTRAVENOUS

## 2015-12-07 MED ORDER — FUROSEMIDE 40 MG PO TABS: 40.0000 mg | ORAL_TABLET | Freq: Every day | ORAL | Status: DC

## 2015-12-07 MED ORDER — DIPHENHYDRAMINE HCL 50 MG/ML IJ SOLN
12.5000 mg | Freq: Four times a day (QID) | INTRAMUSCULAR | Status: DC | PRN
  Administered 2015-12-08 – 2015-12-09 (×2): 12.5 mg via INTRAVENOUS
  Filled 2015-12-07 (×2): qty 1

## 2015-12-07 MED ORDER — MIDAZOLAM HCL 2 MG/2ML IJ SOLN
INTRAMUSCULAR | Status: AC
  Filled 2015-12-07: qty 2

## 2015-12-07 MED ORDER — PIPERACILLIN-TAZOBACTAM 3.375 G IVPB
3.3750 g | Freq: Three times a day (TID) | INTRAVENOUS | Status: AC
  Administered 2015-12-07 – 2015-12-09 (×7): 3.375 g via INTRAVENOUS
  Filled 2015-12-07 (×7): qty 50

## 2015-12-07 MED ORDER — 0.9 % SODIUM CHLORIDE (POUR BTL) OPTIME
TOPICAL | Status: DC | PRN
  Administered 2015-12-07 (×3): 1000 mL

## 2015-12-07 MED ORDER — PHENYLEPHRINE 40 MCG/ML (10ML) SYRINGE FOR IV PUSH (FOR BLOOD PRESSURE SUPPORT)
PREFILLED_SYRINGE | INTRAVENOUS | Status: DC | PRN
  Administered 2015-12-07 (×2): 80 ug via INTRAVENOUS

## 2015-12-07 MED ORDER — SUGAMMADEX SODIUM 500 MG/5ML IV SOLN
INTRAVENOUS | Status: DC | PRN
  Administered 2015-12-07: 238 mg via INTRAVENOUS

## 2015-12-07 MED ORDER — EPHEDRINE 5 MG/ML INJ
INTRAVENOUS | Status: AC
  Filled 2015-12-07: qty 10

## 2015-12-07 MED ORDER — PHENYLEPHRINE HCL 10 MG/ML IJ SOLN
INTRAVENOUS | Status: DC | PRN
  Administered 2015-12-07: 10 ug/min via INTRAVENOUS

## 2015-12-07 MED ORDER — PROPOFOL 10 MG/ML IV BOLUS
INTRAVENOUS | Status: DC | PRN
Start: 2015-12-07 — End: 2015-12-07
  Administered 2015-12-07: 160 mg via INTRAVENOUS

## 2015-12-07 MED ORDER — LACTATED RINGERS IV SOLN: INTRAVENOUS | Status: DC

## 2015-12-07 MED ORDER — ALBUMIN HUMAN 5 % IV SOLN
INTRAVENOUS | Status: DC | PRN
  Administered 2015-12-07 (×2): via INTRAVENOUS

## 2015-12-07 MED ORDER — LACTATED RINGERS IV SOLN
INTRAVENOUS | Status: DC
  Administered 2015-12-07: 08:00:00 via INTRAVENOUS

## 2015-12-07 MED ORDER — PHENYLEPHRINE 40 MCG/ML (10ML) SYRINGE FOR IV PUSH (FOR BLOOD PRESSURE SUPPORT)
PREFILLED_SYRINGE | INTRAVENOUS | Status: AC
  Filled 2015-12-07: qty 10

## 2015-12-07 MED ORDER — MORPHINE SULFATE 2 MG/ML IV SOLN
INTRAVENOUS | Status: AC
  Filled 2015-12-07: qty 25

## 2015-12-07 MED ORDER — FENTANYL CITRATE (PF) 100 MCG/2ML IJ SOLN
INTRAMUSCULAR | Status: DC | PRN
  Administered 2015-12-07 (×3): 100 ug via INTRAVENOUS

## 2015-12-07 MED ORDER — FENTANYL CITRATE (PF) 100 MCG/2ML IJ SOLN
INTRAMUSCULAR | Status: AC
  Filled 2015-12-07: qty 2

## 2015-12-07 MED ORDER — LIDOCAINE HCL (CARDIAC) 20 MG/ML IV SOLN
INTRAVENOUS | Status: DC | PRN
  Administered 2015-12-07: 50 mg via INTRAVENOUS

## 2015-12-07 MED ORDER — DIGOXIN 125 MCG PO TABS
0.1250 mg | ORAL_TABLET | Freq: Every day | ORAL | Status: DC
  Administered 2015-12-08 – 2015-12-15 (×9): 0.125 mg via ORAL
  Filled 2015-12-07 (×9): qty 1

## 2015-12-07 MED ORDER — ALVIMOPAN 12 MG PO CAPS
12.0000 mg | ORAL_CAPSULE | Freq: Two times a day (BID) | ORAL | Status: DC
  Administered 2015-12-08 – 2015-12-13 (×11): 12 mg via ORAL
  Filled 2015-12-07 (×12): qty 1

## 2015-12-07 MED ORDER — FENTANYL CITRATE (PF) 100 MCG/2ML IJ SOLN
INTRAMUSCULAR | Status: AC
  Filled 2015-12-07: qty 4

## 2015-12-07 MED ORDER — LIDOCAINE 2% (20 MG/ML) 5 ML SYRINGE
INTRAMUSCULAR | Status: AC
  Filled 2015-12-07: qty 5

## 2015-12-07 MED ORDER — HYDROMORPHONE HCL 2 MG/ML IJ SOLN
INTRAMUSCULAR | Status: AC
  Filled 2015-12-07: qty 1

## 2015-12-07 SURGICAL SUPPLY — 69 items
BLADE SURG ROTATE 9660 (MISCELLANEOUS) ×2 IMPLANT
CANISTER SUCTION 2500CC (MISCELLANEOUS) ×3 IMPLANT
CHLORAPREP W/TINT 26ML (MISCELLANEOUS) ×3 IMPLANT
COUNTER NEEDLE 20 DBL MAG RED (NEEDLE) ×3 IMPLANT
COVER MAYO STAND STRL (DRAPES) ×5 IMPLANT
COVER SURGICAL LIGHT HANDLE (MISCELLANEOUS) ×5 IMPLANT
DRAIN CHANNEL 19F RND (DRAIN) ×2 IMPLANT
DRAPE LAPAROSCOPIC ABDOMINAL (DRAPES) ×3 IMPLANT
DRAPE PROXIMA HALF (DRAPES) ×2 IMPLANT
DRAPE UTILITY XL STRL (DRAPES) ×7 IMPLANT
DRAPE WARM FLUID 44X44 (DRAPE) ×3 IMPLANT
DRSG OPSITE POSTOP 4X10 (GAUZE/BANDAGES/DRESSINGS) ×2 IMPLANT
DRSG OPSITE POSTOP 4X8 (GAUZE/BANDAGES/DRESSINGS) ×3 IMPLANT
DRSG TELFA 3X8 NADH (GAUZE/BANDAGES/DRESSINGS) ×3 IMPLANT
ELECT BLADE 6.5 EXT (BLADE) ×3 IMPLANT
ELECT CAUTERY BLADE 6.4 (BLADE) ×6 IMPLANT
ELECT REM PT RETURN 9FT ADLT (ELECTROSURGICAL) ×3
ELECTRODE REM PT RTRN 9FT ADLT (ELECTROSURGICAL) ×1 IMPLANT
EVACUATOR SILICONE 100CC (DRAIN) ×3 IMPLANT
GAUZE SPONGE 4X4 12PLY STRL (GAUZE/BANDAGES/DRESSINGS) ×3 IMPLANT
GEL ULTRASOUND 20GR AQUASONIC (MISCELLANEOUS) IMPLANT
GLOVE BIO SURGEON STRL SZ 6.5 (GLOVE) ×6 IMPLANT
GLOVE BIO SURGEON STRL SZ7 (GLOVE) ×4 IMPLANT
GLOVE BIO SURGEON STRL SZ8 (GLOVE) ×9 IMPLANT
GLOVE BIO SURGEONS STRL SZ 6.5 (GLOVE) ×3
GLOVE BIOGEL PI IND STRL 7.0 (GLOVE) ×3 IMPLANT
GLOVE BIOGEL PI IND STRL 7.5 (GLOVE) ×2 IMPLANT
GLOVE BIOGEL PI IND STRL 8.5 (GLOVE) IMPLANT
GLOVE BIOGEL PI INDICATOR 7.0 (GLOVE) ×6
GLOVE BIOGEL PI INDICATOR 7.5 (GLOVE) ×4
GLOVE BIOGEL PI INDICATOR 8.5 (GLOVE) ×4
GLOVE SURG SS PI 8.0 STRL IVOR (GLOVE) ×2 IMPLANT
GOWN STRL REUS W/ TWL LRG LVL3 (GOWN DISPOSABLE) ×2 IMPLANT
GOWN STRL REUS W/TWL 2XL LVL3 (GOWN DISPOSABLE) ×6 IMPLANT
GOWN STRL REUS W/TWL LRG LVL3 (GOWN DISPOSABLE) ×6
GOWN STRL REUS W/TWL XL LVL3 (GOWN DISPOSABLE) ×6 IMPLANT
KIT BASIN OR (CUSTOM PROCEDURE TRAY) ×3 IMPLANT
KIT ROOM TURNOVER OR (KITS) ×3 IMPLANT
MESH PHASIX RESORB RECT 15X20 (Mesh General) ×3 IMPLANT
NS IRRIG 1000ML POUR BTL (IV SOLUTION) ×9 IMPLANT
PACK GENERAL/GYN (CUSTOM PROCEDURE TRAY) ×3 IMPLANT
PAD ARMBOARD 7.5X6 YLW CONV (MISCELLANEOUS) ×3 IMPLANT
PENCIL BUTTON HOLSTER BLD 10FT (ELECTRODE) ×3 IMPLANT
SPECIMEN JAR MEDIUM (MISCELLANEOUS) IMPLANT
SPONGE LAP 18X18 X RAY DECT (DISPOSABLE) ×10 IMPLANT
STAPLER CIRC ILS CVD 33MM 37CM (STAPLE) ×3 IMPLANT
STAPLER CUT CVD 40MM GREEN (STAPLE) ×3 IMPLANT
STAPLER VISISTAT 35W (STAPLE) ×3 IMPLANT
SUCTION POOLE TIP (SUCTIONS) ×3 IMPLANT
SUT ETHILON 2 0 FS 18 (SUTURE) ×3 IMPLANT
SUT NOVA 1 T20/GS 25DT (SUTURE) ×14 IMPLANT
SUT PDS AB 1 CTX 36 (SUTURE) ×6 IMPLANT
SUT PROLENE 2 0 KS (SUTURE) ×3 IMPLANT
SUT SILK 2 0 (SUTURE) ×3
SUT SILK 2 0 SH CR/8 (SUTURE) ×3 IMPLANT
SUT SILK 2 0 TIES 10X30 (SUTURE) ×3 IMPLANT
SUT SILK 2-0 18XBRD TIE 12 (SUTURE) ×1 IMPLANT
SUT SILK 3 0 SH CR/8 (SUTURE) ×12 IMPLANT
SUT SILK 3 0 TIES 10X30 (SUTURE) ×3 IMPLANT
SUT VIC AB 2-0 SH 18 (SUTURE) ×6 IMPLANT
SUT VIC AB 3-0 SH 18 (SUTURE) ×9 IMPLANT
SYR BULB IRRIGATION 50ML (SYRINGE) ×5 IMPLANT
TOWEL OR 17X24 6PK STRL BLUE (TOWEL DISPOSABLE) ×3 IMPLANT
TOWEL OR 17X26 10 PK STRL BLUE (TOWEL DISPOSABLE) ×4 IMPLANT
TRAY FOLEY CATH 16FRSI W/METER (SET/KITS/TRAYS/PACK) ×3 IMPLANT
TUBE CONNECTING 12'X1/4 (SUCTIONS) ×1
TUBE CONNECTING 12X1/4 (SUCTIONS) ×2 IMPLANT
UNDERPAD 30X30 (UNDERPADS AND DIAPERS) ×2 IMPLANT
YANKAUER SUCT BULB TIP NO VENT (SUCTIONS) ×6 IMPLANT

## 2015-12-07 NOTE — Anesthesia Procedure Notes (Signed)
Procedure Name: Intubation Date/Time: 12/07/2015 10:01 AM Performed by: Eligha Bridegroom Pre-anesthesia Checklist: Patient identified, Emergency Drugs available, Suction available, Patient being monitored and Timeout performed Patient Re-evaluated:Patient Re-evaluated prior to inductionOxygen Delivery Method: Circle system utilized Preoxygenation: Pre-oxygenation with 100% oxygen Intubation Type: IV induction Ventilation: Mask ventilation without difficulty Laryngoscope Size: Mac and 4 Grade View: Grade I Tube type: Oral Tube size: 7.5 mm Number of attempts: 1 Placement Confirmation: ETT inserted through vocal cords under direct vision,  positive ETCO2 and breath sounds checked- equal and bilateral Secured at: 22 cm Tube secured with: Tape Dental Injury: Teeth and Oropharynx as per pre-operative assessment

## 2015-12-07 NOTE — Op Note (Signed)
12/07/2015  12:37 PM  PATIENT:  William Fischer  50 y.o. male  Patient Care Team: Norman Herrlich, MD as PCP - General (Internal Medicine)  PRE-OPERATIVE DIAGNOSIS:  VENTRAL HERNIA, COLOSTOMY  POST-OPERATIVE DIAGNOSIS:  VENTRAL HERNIA, COLOSTOMY  PROCEDURE:   OPEN REVERSAL OF COLOSTOMY WITH PARTIAL SIGMOID COLECTOMY INCISIONAL HERNIA REPAIR  Co- Surgeon(s): Leighton Ruff, MD Rolm Bookbinder, MD   ANESTHESIA:   general  EBL:  Total I/O In: 3500 [I.V.:3000; IV Piggyback:500] Out: 230 [Urine:80; Blood:150]  DRAINS: none   SPECIMEN:  Source of Specimen:  distal sigmoid colon  DISPOSITION OF SPECIMEN:  PATHOLOGY  COUNTS:  YES  PLAN OF CARE: Admit to inpatient   PATIENT DISPOSITION:  PACU - hemodynamically stable.  INDICATION: 50 year old male who presents to the office for colostomy reversal. The patient has developed a large ventral hernia as well. It was decided to do a joint procedure with Dr. Donne Hazel to address both at the same time.   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 Significant small bowel adhesions to the sigmoid colon.  Large ventral hernia.  DESCRIPTION: the patient was identified in the preoperative holding area and taken to the OR where they were laid supine on the operating room table.  General anesthesia was induced without difficulty. SCDs were also noted to be in place prior to the initiation of anesthesia.  The patient was placed in lithotomy position and a Foley catheter was inserted under sterile conditions. The patient was then prepped and draped in the usual sterile fashion.   A surgical timeout was performed indicating the correct patient, procedure, positioning and need for preoperative antibiotics.   I began by making an incision around the patient's scar with a 10 blade scalpel. The scar was dissected off of the abdominal wall using electrocautery. The abdomen was then entered through the hernia sac. The  small bowel was mobilized off of the omentum using blunt dissection and electrocautery. I terminated attention to the patient's left lower quadrant. The colon could be palpated running along the left pericolic gutter. I bluntly mobilized the distal sigmoid colon and mobilized this off of the abdominal sidewall using blunt dissection. I identified the fistula tract in the left paracolic gutter. This was gently debrided. I continued to probe the colon up. There was several loops of small bowel that were adherent to the staple line. These were carefully dissected away using sharp dissection with Metzenbaum scissors. Phalanx in total were dissected free from the colon. We repaired some small serosal tears using interrupted 3-0 silk sutures. There were no full-thickness injuries. After the small bowel was free from the colon stump, I mobilized this up to the level of the rectosigmoid junction. I bluntly dissected behind the retrosigmoid junction and stapled this with a green load contour stapler. The remaining mesentery was divided using a Kelly clamp and 2-0 silk sutures. This was then sent to pathology for further examination. Otherwise the patient's from the right-sided colostomy. I made an incision around the ostomy site using electrocautery. Dissection was carried down to the level of the fascia using blunt dissection and electrocautery. Once the colon was completely free, the top of the colostomy was resected using electrocautery. A 2-0 Prolene pursestring suture was placed using the pursestring device. There was good bleeding at the resection margin. 3 mm EEA anvil was placed into the remaining transverse colon and tied tightly around this. This was then placed back into the abdomen. The patient did not have enough  colon to mobilize down the left side of the abdomen and therefore the colon was mobilized down the right side of the abdomen to the pelvis. It easily reached into the pelvis without any tension. The EEA  stapler was inserted into the rectum and gently brought out distally. An anastomosis was created. There was no tension when tested with insufflation under water. I would estimate the patient has approximately one third of his colon remaining. I irrigated the abdomen with warm normal saline. The omentum was then placed in the left paracolic gutter to allow for the area to heal. We then switched to clean gowns, gloves, instruments and drapes. I then turned the case over to Dr. Donne Hazel for his hernia repair. This will be dictated separately.  I assisted Dr. Donne Hazel with his hernia repair and he assisted me with the colostomy reversal. Once his fascia was closed, the subcutaneous tissue was reapproximated using 2-0 Vicryl sutures. The skin was closed with staples. The colostomy site was reapproximated using a 2-0 Vicryl pursestring suture. A Telfa wick was placed in the middle. A sterile dressing was applied. The patient was awakened from anesthesia and sent to the postanesthesia care unit in stable condition. All counts were correct per operating room staff.

## 2015-12-07 NOTE — Anesthesia Postprocedure Evaluation (Signed)
Anesthesia Post Note  Patient: William Fischer  Procedure(s) Performed: Procedure(s) (LRB): OPEN REVERSAL OF COLOSTOMY WITH PARTIAL SIGMOID COLECTOMY (N/A) INCISIONAL HERNIA REPAIR (N/A)  Patient location during evaluation: PACU Anesthesia Type: General Level of consciousness: awake and alert Pain management: pain level controlled Vital Signs Assessment: post-procedure vital signs reviewed and stable Respiratory status: spontaneous breathing, nonlabored ventilation, respiratory function stable and patient connected to nasal cannula oxygen Cardiovascular status: blood pressure returned to baseline and stable Postop Assessment: no signs of nausea or vomiting Anesthetic complications: no    Last Vitals:  Vitals:   12/07/15 1320 12/07/15 1335  BP: (!) 117/59 (!) 108/94  Pulse: 72   Resp: (!) 24   Temp:      Last Pain:  Vitals:   12/07/15 1325  TempSrc:   PainSc: 8                  Digna Countess,W. EDMOND

## 2015-12-07 NOTE — Transfer of Care (Signed)
Immediate Anesthesia Transfer of Care Note  Patient: William Fischer  Procedure(s) Performed: Procedure(s): OPEN REVERSAL OF COLOSTOMY WITH PARTIAL SIGMOID COLECTOMY (N/A) INCISIONAL HERNIA REPAIR (N/A)  Patient Location: PACU  Anesthesia Type:General  Level of Consciousness: awake, alert  and oriented  Airway & Oxygen Therapy: Patient Spontanous Breathing and Patient connected to nasal cannula oxygen  Post-op Assessment: Report given to RN and Post -op Vital signs reviewed and stable  Post vital signs: Reviewed and stable  Last Vitals:  Vitals:   12/07/15 0646 12/07/15 1249  BP: 118/64   Pulse: (!) 49 89  Resp: 18 12  Temp: 36.7 C 36.3 C    Last Pain:  Vitals:   12/07/15 0646  TempSrc: Oral      Patients Stated Pain Goal: 2 ([id number])  Complications: No apparent anesthesia complications

## 2015-12-07 NOTE — Progress Notes (Signed)
Patient stated that is always 10/10 and Morphine PCA is not relieving the pain. Patient stated he would like pain pills on top of the PCA or a stronger PCA pain med. Called surgeon on call and Dr. Kieth Brightly ordered Morphine 4mg  IV push once and increased lock out time of PCA. No s/sx of distress. Will continue to monitor.

## 2015-12-07 NOTE — Interval H&P Note (Signed)
History and Physical Interval Note:  12/07/2015 8:32 AM  William Fischer  has presented today for surgery, with the diagnosis of VENTRAL HERNIA, COLOSTOMY  The various methods of treatment have been discussed with the patient and family. After consideration of risks, benefits and other options for treatment, the patient has consented to  Procedure(s): OPEN REVERSAL OF COLOSTOMY WITH PARTIAL COLECTOMY (N/A) INCISIONAL HERNIA REPAIR WITH POSSIBLE MESH PLACEMENT (N/A) as a surgical intervention .  The patient's history has been reviewed, patient examined, no change in status, stable for surgery.  I have reviewed the patient's chart and labs.  Questions were answered to the patient's satisfaction.   Pt now has completed his course of antibiotics.  He is ready for surgical resection of his disease colon.  If there is not too much inflammation of his rectum, we will plan on reversing his ostomy as well.  Dr Donne Hazel will then assess for possible hernia repair.  Discussed with him there is a significant chance of bleeding, which can lead to heart problems.  We will try to get him back on his anticoagulation as soon as possible.   The surgery and anatomy were described to the patient as well as the risks of surgery and the possible complications.  These include: Bleeding, deep abdominal infections and possible wound complications such as hernia and infection, damage to adjacent structures, leak of surgical connections, which can lead to other surgeries and possibly an ostomy, possible need for other procedures, such as abscess drains in radiology, possible prolonged hospital stay, possible diarrhea from removal of part of the colon, possible constipation from narcotics, prolonged fatigue/weakness or appetite loss, possible early recurrence of of disease, possible complications of their medical problems such as heart disease or arrhythmias or lung problems, death (less than 1%). I believe the patient understands  and wishes to proceed with the surgery.    Rosario Adie, MD  [facility]

## 2015-12-07 NOTE — H&P (View-Only) (Signed)
Reason for Consult: abdominal pain, back pain, purulent drainage from the old drain site and new drainage into JP; fever Referring Physician: Dr. Lauretta Grill  William Fischer is an 50 y.o. male.   CC:Pt reports he started feeling bad 3 weeks ago.  Drains were empty not draining and old drain site was not draining at that time.  He is waiting on colostomy reversal and hernia repair scheduled for next, month.  8 days ago he started having some pain in his back, and lower abdomen on the left.  Over the weekend it became worse, and he developed fever up to 102, and the LLQ site where his drain came out started draining again.  Today the old JP drain started draining purulent fluid also and he returns for evaluation.  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 His postoperative has been complicated by [name] pouch dehiscence and multiple admissions for recurrent intra-abdominal abscesses. He has had multiple admissions (7).He currently has 2 drains in place, placed by IR 09/30/15.  He went home on 10/02/15  He was treated at that time with Cefdinir/Flagyl and PO vancomycin for C diff. Repeat CT scan on 10/15/15, he had lost the LLQ drain at home after it was caught on a door.  No drainage from the site after it came out.  He completed antibiotic Rx.  Repeat CT at that time revealed a leak in the rectal stump with fistulous tract to to his left psoas area as well as the LLQ area where his previous drain was in place.  Work up in the ED shows fever with Tm 100.8.  His BP has come down during his stay in the ED.  Labs show Na 132, K+5.4, CO2 97, creatinine is up to 1.27. Lactate, and LFT's are normal.  I don't see a CBC.  CT scan shows re accumulation of retroperitoneal fluid, left posterior pararenal space surrounding the percutaneous drainage catheter.  It extends superiorly to the left posterior subdiaphragmatic region mild increase in left pleural effusion and left basilar atelectasis. Fluid collection left lower quadrant adjacent to the Hartman's is pouch is stable as stated with fistulous track resolution of the fluid collection left abdominal wall subcutaneous tissue. He also has a large ventral hernia wall containing small bowel and colon. There is no obstruction seen.We are ask to see.  He is admitted by medicine. His last dose of Xarelto was last evening 11/16/15.   [name]  Past Medical History:  Diagnosis Date  . Allergy to IVP dye   . Atrial fibrillation ([place])    admx 11/13 with acute sCHF in setting of RVR  => a. failed DCCV x 2; b. Pradaxa started;  c. failed sotalol  . Cardiomyopathy ([place])    likely tachy mediated in setting of AF with RVR  . Chicken pox as a child  . Chronic anticoagulation    Pradaxa  . Chronic systolic heart failure ([place])    a. echo 11/13: Ef 40-45%, diff HK, mod MR, mod LAE, mild RVE, mod RAE, small effusion;   b. TEE 11/13:  EF 35-40%, no LAA clot; Echo 2/14 shows normal EF  . Depression with anxiety 06/29/2011  . Migraine 06/29/2011  . Obesity 06/29/2011  . Snoring    patient needs sleep study - has declined    Past Surgical History:  Procedure Laterality Date  .  CARDIOVERSION  11/30/2011   Procedure: CARDIOVERSION;  Surgeon: Thayer Headings, MD;  Location: [facility];  Service: Cardiovascular;  Laterality: N/A;  . CARDIOVERSION  12/03/2011   Procedure: CARDIOVERSION;  Surgeon: Jolaine Artist, MD;  Location: [facility];  Service: Cardiovascular;  Laterality: N/A;  . IR GENERIC HISTORICAL  08/11/2015   IR RADIOLOGIST EVAL & MGMT 08/11/2015 Markus Daft, MD [facility] INTERV RAD  . LAPAROTOMY N/A 01/17/2015   Procedure: EXPLORATORY LAPAROTOMY WITH LEFT COLECTOMY AND COLOSTOMY;  Surgeon: Rolm Bookbinder, MD;  Location: [facility];  Service: General;  Laterality: N/A;  . TEE WITHOUT CARDIOVERSION  11/30/2011    Procedure: TRANSESOPHAGEAL ECHOCARDIOGRAM (TEE);  Surgeon: Thayer Headings, MD;  Location: [facility] ENDOSCOPY;  Service: Cardiovascular;  Laterality: N/A;    Family History  Problem Relation Age of Onset  . Hypertension Mother   . Hypertension Father   . Aneurysm Father   . Alcohol abuse Father   . Cancer Maternal Grandmother     brain  . Diabetes Maternal Grandfather     type 2  . Parkinsonism Maternal Grandfather   . Cancer Paternal Grandmother     breast  . Diabetes Paternal Grandmother   . Alcohol abuse Paternal Grandfather     Social History:  reports that he quit smoking about 16 months ago. His smoking use included Cigarettes. He has a 20.00 pack-year smoking history. He has never used smokeless tobacco. He reports that he does not drink alcohol or use drugs.  Allergies:  Allergies  Allergen Reactions  . Contrast Media [Iodinated Diagnostic Agents] Rash    a diffuse macular rash after CTA chest  Pt was premedicated with 125mg  IV Solumedrol, 50mg  IV Benadryl 1 hr prior to CTexam, and tolerated procedure without any difficulties. 11/28/11 Also same pre med protocol observed on 02/22/15 and pt tolerated the procedure well    Medications:  Prior to Admission:  (Not in a hospital admission) Scheduled: Continuous: PRN: Anti-infectives    Start     Dose/Rate Route Frequency Ordered Stop   11/17/15 1430  piperacillin-tazobactam (ZOSYN) IVPB 3.375 g     3.375 g 100 mL/hr over 30 Minutes Intravenous  Once 11/17/15 1421 11/17/15 1622      Results for orders placed or performed during the hospital encounter of 11/17/15 (from the past 48 hour(s))  CBC with Differential     Status: Abnormal   Collection Time: 11/17/15 12:01 PM  Result Value Ref Range   WBC 14.2 (H) 4.0 - 10.5 K/uL   RBC 4.74 4.22 - 5.81 MIL/uL   Hemoglobin 14.7 13.0 - 17.0 g/dL   HCT 42.0 39.0 - 52.0 %   MCV 88.6 78.0 - 100.0 fL   MCH 31.0 26.0 - 34.0 pg   MCHC 35.0 30.0 - 36.0 g/dL   RDW 15.9 (H) 11.5 - 15.5  %   Platelets 390 150 - 400 K/uL   Neutrophils Relative % 82 %   Neutro Abs 11.5 (H) 1.7 - 7.7 K/uL   Lymphocytes Relative 7 %   Lymphs Abs 1.0 0.7 - 4.0 K/uL   Monocytes Relative 11 %   Monocytes Absolute 1.6 (H) 0.1 - 1.0 K/uL   Eosinophils Relative 0 %   Eosinophils Absolute 0.0 0.0 - 0.7 K/uL   Basophils Relative 0 %   Basophils Absolute 0.0 0.0 - 0.1 K/uL  I-Stat CG4 Lactic Acid, ED     Status: None   Collection Time: 11/17/15 12:28 PM  Result Value Ref Range   Lactic  Acid, Venous 1.63 0.5 - 1.9 mmol/L  I-Stat Chem 8, ED     Status: Abnormal   Collection Time: 11/17/15  1:29 PM  Result Value Ref Range   Sodium 132 (L) 135 - 145 mmol/L   Potassium 5.0 3.5 - 5.1 mmol/L   Chloride 97 (L) 101 - 111 mmol/L   BUN 22 (H) 6 - 20 mg/dL   Creatinine, Ser 1.20 0.61 - 1.24 mg/dL   Glucose, Bld 135 (H) 65 - 99 mg/dL   Calcium, Ion 1.11 (L) 1.15 - 1.40 mmol/L   TCO2 25 0 - 100 mmol/L   Hemoglobin 15.0 13.0 - 17.0 g/dL   HCT 44.0 39.0 - 52.0 %    Dg Chest Portable 1 View  Result Date: 11/17/2015 CLINICAL DATA:  Vomiting, drainage from bold incision site EXAM: PORTABLE CHEST 1 VIEW COMPARISON:  07/25/2015 FINDINGS: There is left lower lobe airspace disease likely reflecting atelectasis versus pneumonia. There is no pleural effusion or pneumothorax. The heart and mediastinal contours are unremarkable. The osseous structures are unremarkable. IMPRESSION: Left lower lobe airspace disease the reflect atelectasis versus pneumonia. Electronically Signed   By: Kathreen Devoid   On: 11/17/2015 13:58    Review of Systems  Constitutional: Positive for fever (up to 102 at home Sunday 10/29) and weight loss.       Morbidly obese male who has been sick for a year.  Currently he only walks in the house and has SPB lying down.  Very depressed and deconditioned.    HENT: Negative.   Eyes: Negative.   Respiratory: Positive for cough, sputum production and shortness of breath (seem to be related to  position.  sleeps in recliner since his first surgery for perforated colon.).   Cardiovascular: Positive for chest pain (in the past none for a year) and orthopnea. Negative for claudication and leg swelling.       He only walks in the house to the bathroom.  Gastrointestinal: Positive for abdominal pain, heartburn, nausea and vomiting. Negative for blood in stool, constipation and melena.       He notes stools are now very watery.    Genitourinary: Negative for dysuria, flank pain, frequency, hematuria and urgency.       He notes the urine has gotten a dark orange color over the last week.  Musculoskeletal: Positive for back pain (back pain started about a week ago on left side).  Skin:       Draining purulent fluid from the LLQ old drain site.  Erythema abdominal wall and midline.  Neurological: Positive for weakness.       Pt reports feeling weak and falling last week.  He missed his chair sitting down.    Endo/Heme/Allergies: Bruises/bleeds easily (On Xarelto last dose 10/30 in the PM).  Psychiatric/Behavioral: The patient is nervous/anxious.        Unable to sleep, progressively worse last 6 weeks.   Blood pressure (!) 151/128, pulse 92, temperature 100.8 F (38.2 C), temperature source Oral, resp. rate 17, height 5' 10.5" (1.791 m), weight 112 kg (247 lb), SpO2 96 %. Physical Exam  Constitutional: He is oriented to person, place, and time.  Obese WM, chronically ill appearing, with purulent fluid coming from LLQ old drain site, Purulent fluid in old IR drain left side, some drainage around the drain also.  HENT:  Head: Normocephalic and atraumatic.  Eyes: Right eye exhibits no discharge. Left eye exhibits no discharge. No scleral icterus.  Neck: Normal range of motion.  Neck supple. No JVD present. No tracheal deviation present. No thyromegaly present.  Cardiovascular: Normal rate and intact distal pulses.   No murmur heard. Chronic AF  Respiratory: Effort normal and breath sounds  normal. No respiratory distress. He has no wheezes. He has no rales. He exhibits no tenderness.  GI: Soft. He exhibits distension. He exhibits no mass. There is tenderness. There is no rebound and no guarding.  Obese, with erythema along midline old incision.  Multiple ventral hernias.  Ostomy bag full of malodorous fluid.  Very watery which he says is new. Drain site LLQ drain is out now draining purulent fluid.  Old IR drain Left side that has been dry started draining purulent fluid that started draining today.    Musculoskeletal: He exhibits no edema or tenderness.  Lymphadenopathy:    He has no cervical adenopathy.  Neurological: He is alert and oriented to person, place, and time. No cranial nerve deficit.  Skin: Skin is warm and dry. No rash noted. No erythema. No pallor.  Psychiatric: He has a normal mood and affect. His behavior is normal. Judgment and thought content normal.    Assessment/Plan: Recurring retroperitoneal fluid collection/abscess Fluid collection left lower quadrant adjacent to Hartman's pouch secondary to leak of his rectal stump. Simona Huh post emergent hemicolectomy with colostomy 01/17/15, Rolm Bookbinder History of C. difficile colitis History of atrial fibrillation with chronic coagulation/Xarelto last dose 11/16/15 Cardiomyopathy with a history of chronic systolic heart failure Obesity Situational depression and anxiety History tobacco use  Plan: He's been admitted to medicine for medical management. We've restarted Zosyn in the emergency department. Will review CT scan and discuss further interventional drainage of fluid collections.   Lyah Millirons 11/17/2015, 2:12 PM

## 2015-12-07 NOTE — Op Note (Signed)
Preoperative diagnosis: incisional hernia, colostomy Postoperative diagnosis: same as above Procedure: 1. Bilateral posterior rectus sheath releases with advancement flaps and primary abdominal wall closure 2. [place] phasix mesh to closed laparotomy Surgeon: Dr Serita Grammes Cosurgeon: Dr Leighton Ruff EBL: 50 cc Specimens none Drains none Complications none Sponge and needle count correct times two dispo to recovery stable  Indications: This is a 59 yom who I know from prior ischemic left colon that required left colectomy and transverse colostomy. This has been complicated by repeat infections and IR placed drains that have come from the rectal stump. He also has midline incisional hernia present.  Dr Marcello Moores is going to proceed with colostomy takedown and I am going to perform abdominal closure.  Procedure: I assisted Dr Marcello Moores with the colostomy takedown and lysis of adhesions.  Once this was completed including closing the colostomy site with #1 novafil sutures I took over.  It was clear that the fascia would not just close primarily and we needed coverage.  I made incisions on the posterior rectus sheath bilaterally from the uppermost to caudad portion of the wound. The posterior sheath was retracted and this would not have closed unless I released more which I elected not to do to hopefully preserve for later possible repair.  Once I was able to release these the anterior fascia was able to be advanced without really any tension to close. I did create some small skin flaps to get back to healthy tissue to close also. I then closed the anterior fascia with multiple interrupted #1 novafil sutures.  This was without tension.  Irrigation was performed and I obtained hemostasis.  I then placed an 8 inch long piece of phasix mesh as onlay over the incision and sutured this down to the oblique with 3-0 vicryl.  The subcutaneous tissue was then closed with 2-0 vicryl.  Staples were used to close the  skin.  Dressings were placed. He was transferred to pacu stable.

## 2015-12-07 NOTE — Progress Notes (Signed)
Orthopedic Tech Progress Note Patient Details:  KAMARRION SCHOESSOW Dec 18, 1965 [id number]  Ortho Devices Type of Ortho Device: Abdominal binder Ortho Device/Splint Location: abdomen Ortho Device/Splint Interventions: Loanne Drilling, Akhilesh Sassone 12/07/2015, 4:39 PM

## 2015-12-08 ENCOUNTER — Encounter (HOSPITAL_COMMUNITY): Payer: Self-pay | Admitting: General Surgery

## 2015-12-08 ENCOUNTER — Inpatient Hospital Stay (HOSPITAL_COMMUNITY): Payer: BLUE CROSS/BLUE SHIELD

## 2015-12-08 LAB — CBC
HEMATOCRIT: 40.6 % (ref 39.0–52.0)
Hemoglobin: 13.4 g/dL (ref 13.0–17.0)
MCH: 29.5 pg (ref 26.0–34.0)
MCHC: 33 g/dL (ref 30.0–36.0)
MCV: 89.2 fL (ref 78.0–100.0)
PLATELETS: 349 10*3/uL (ref 150–400)
RBC: 4.55 MIL/uL (ref 4.22–5.81)
RDW: 16.2 % — AB (ref 11.5–15.5)
WBC: 18.1 10*3/uL — AB (ref 4.0–10.5)

## 2015-12-08 LAB — GLUCOSE, CAPILLARY
GLUCOSE-CAPILLARY: 195 mg/dL — AB (ref 65–99)
GLUCOSE-CAPILLARY: 167 mg/dL — AB (ref 65–99)
GLUCOSE-CAPILLARY: 189 mg/dL — AB (ref 65–99)
GLUCOSE-CAPILLARY: 146 mg/dL — AB (ref 65–99)

## 2015-12-08 LAB — BASIC METABOLIC PANEL
Anion gap: 8 (ref 5–15)
BUN: 15 mg/dL (ref 6–20)
CHLORIDE: 101 mmol/L (ref 101–111)
CO2: 26 mmol/L (ref 22–32)
CREATININE: 1.1 mg/dL (ref 0.61–1.24)
Calcium: 9.1 mg/dL (ref 8.9–10.3)
GFR calc Af Amer: >60 mL/min (ref >60)
GFR calc non Af Amer: >60 mL/min (ref >60)
GLUCOSE: 183 mg/dL — AB (ref 65–99)
POTASSIUM: 4.6 mmol/L (ref 3.5–5.1)
Sodium: 135 mmol/L (ref 135–145)

## 2015-12-08 LAB — APTT
aPTT: 30 seconds (ref 24–36)
aPTT: 39 seconds — ABNORMAL HIGH (ref 24–36)

## 2015-12-08 LAB — HEPARIN LEVEL (UNFRACTIONATED)
HEPARIN UNFRACTIONATED: 0.19 [IU]/mL — AB (ref 0.30–0.70)
Heparin Unfractionated: <0.1 IU/mL — ABNORMAL LOW (ref 0.30–0.70)

## 2015-12-08 LAB — TROPONIN I: Troponin I: 0.03 ng/mL (ref <0.03)

## 2015-12-08 MED ORDER — KCL IN DEXTROSE-NACL 20-5-0.45 MEQ/L-%-% IV SOLN
INTRAVENOUS | Status: DC
  Administered 2015-12-08 – 2015-12-13 (×9): via INTRAVENOUS
  Filled 2015-12-08 (×13): qty 1000

## 2015-12-08 MED ORDER — HEPARIN (PORCINE) IN NACL 100-0.45 UNIT/ML-% IJ SOLN
2700.0000 [IU]/h | INTRAMUSCULAR | Status: DC
  Administered 2015-12-08: 1550 [IU]/h via INTRAVENOUS
  Administered 2015-12-09: 1900 [IU]/h via INTRAVENOUS
  Administered 2015-12-09: 2200 [IU]/h via INTRAVENOUS
  Administered 2015-12-10: 2400 [IU]/h via INTRAVENOUS
  Filled 2015-12-08 (×4): qty 250

## 2015-12-08 MED ORDER — METOPROLOL TARTRATE 5 MG/5ML IV SOLN
5.0000 mg | Freq: Once | INTRAVENOUS | Status: AC
  Administered 2015-12-08: 5 mg via INTRAVENOUS

## 2015-12-08 MED ORDER — SODIUM CHLORIDE 0.9% FLUSH: 10.0000 mL | INTRAVENOUS | Status: DC | PRN

## 2015-12-08 MED ORDER — METOPROLOL TARTRATE 5 MG/5ML IV SOLN
INTRAVENOUS | Status: AC
  Administered 2015-12-08: 06:00:00
  Filled 2015-12-08: qty 5

## 2015-12-08 MED ORDER — INSULIN ASPART 100 UNIT/ML ~~LOC~~ SOLN: 0.0000 [IU] | Freq: Every day | SUBCUTANEOUS | Status: DC

## 2015-12-08 MED ORDER — METOPROLOL TARTRATE 5 MG/5ML IV SOLN
10.0000 mg | Freq: Four times a day (QID) | INTRAVENOUS | Status: DC
  Administered 2015-12-08 – 2015-12-13 (×20): 10 mg via INTRAVENOUS
  Filled 2015-12-08 (×20): qty 10

## 2015-12-08 MED ORDER — MORPHINE SULFATE 2 MG/ML IV SOLN
INTRAVENOUS | Status: DC
  Administered 2015-12-08 (×2): via INTRAVENOUS
  Administered 2015-12-08: 21.52 mg via INTRAVENOUS
  Administered 2015-12-08: 48.68 mg via INTRAVENOUS
  Administered 2015-12-09: 10.99 mg via INTRAVENOUS
  Administered 2015-12-09: 24 mg via INTRAVENOUS
  Administered 2015-12-09: 17.06 mg via INTRAVENOUS
  Administered 2015-12-09 (×2): via INTRAVENOUS
  Administered 2015-12-09: 20.75 mg via INTRAVENOUS
  Administered 2015-12-09: 30.36 mg via INTRAVENOUS
  Administered 2015-12-09: 13.2 mg via INTRAVENOUS
  Administered 2015-12-09: 22.5 mg via INTRAVENOUS
  Administered 2015-12-10: 31.5 mg via INTRAVENOUS
  Administered 2015-12-10: 14:00:00 via INTRAVENOUS
  Administered 2015-12-10: 16.9 mg via INTRAVENOUS
  Administered 2015-12-10: 12 mg via INTRAVENOUS
  Administered 2015-12-10: 9.69 mg via INTRAVENOUS
  Administered 2015-12-11: 34.5 mg via INTRAVENOUS
  Administered 2015-12-11: 12 mg via INTRAVENOUS
  Administered 2015-12-11: 13:00:00 via INTRAVENOUS
  Administered 2015-12-11: 29.66 mg via INTRAVENOUS
  Administered 2015-12-11 (×2): 13.5 mg via INTRAVENOUS
  Administered 2015-12-11: 21:00:00 via INTRAVENOUS
  Administered 2015-12-12: 23.18 mg via INTRAVENOUS
  Administered 2015-12-12 (×2): via INTRAVENOUS
  Administered 2015-12-12: 18.15 mg via INTRAVENOUS
  Administered 2015-12-12: 44 mg via INTRAVENOUS
  Administered 2015-12-12: 22.51 mg via INTRAVENOUS
  Administered 2015-12-12: 44.76 mg via INTRAVENOUS
  Administered 2015-12-13: 17:00:00 via INTRAVENOUS
  Administered 2015-12-13: 33 mg via INTRAVENOUS
  Administered 2015-12-13: 11.5 mg via INTRAVENOUS
  Administered 2015-12-13: 08:00:00 via INTRAVENOUS
  Administered 2015-12-13: 19.48 mg via INTRAVENOUS
  Administered 2015-12-13: 17.45 mg via INTRAVENOUS
  Administered 2015-12-13: 42.12 mg via INTRAVENOUS
  Administered 2015-12-14: 20.52 mg via INTRAVENOUS
  Administered 2015-12-14: 21 mg via INTRAVENOUS
  Administered 2015-12-14: 13:00:00 via INTRAVENOUS
  Administered 2015-12-14: 21.34 mg via INTRAVENOUS
  Administered 2015-12-14: 15.84 mg via INTRAVENOUS
  Filled 2015-12-08 (×16): qty 25

## 2015-12-08 MED ORDER — INSULIN ASPART 100 UNIT/ML ~~LOC~~ SOLN
0.0000 [IU] | Freq: Three times a day (TID) | SUBCUTANEOUS | Status: DC
  Administered 2015-12-08 (×2): 4 [IU] via SUBCUTANEOUS
  Administered 2015-12-09 – 2015-12-15 (×13): 3 [IU] via SUBCUTANEOUS

## 2015-12-08 MED ORDER — DEXTROSE 5 % IV SOLN
5.0000 mg/h | INTRAVENOUS | Status: DC
  Administered 2015-12-08: 7.5 mg/h via INTRAVENOUS
  Administered 2015-12-08: 5 mg/h via INTRAVENOUS
  Administered 2015-12-09 – 2015-12-13 (×6): 7.5 mg/h via INTRAVENOUS
  Filled 2015-12-08 (×9): qty 100

## 2015-12-08 MED ORDER — DILTIAZEM HCL-DEXTROSE 100-5 MG/100ML-% IV SOLN (PREMIX)
5.0000 mg/h | INTRAVENOUS | Status: DC
  Administered 2015-12-08: 5 mg/h via INTRAVENOUS
  Filled 2015-12-08: qty 100

## 2015-12-08 NOTE — Progress Notes (Signed)
[place] for heparin  Indication: atrial fibrillation  Allergies  Allergen Reactions  . Contrast Media [Iodinated Diagnostic Agents] Rash    a diffuse macular rash after CTA chest  Pt was premedicated with 125mg  IV Solumedrol, 50mg  IV Benadryl 1 hr prior to CTexam, and tolerated procedure without any difficulties. 11/28/11 Also same pre med protocol observed on 02/22/15 and pt tolerated the procedure well    Patient Measurements: Height: 5\' 11"  (180.3 cm) Weight: 269 lb 6.4 oz (122.2 kg) IBW/kg (Calculated) : 75.3 Heparin Dosing Weight: 102.5kg  Vital Signs: Temp: 98.2 F (36.8 C) (11/21 0717) Temp Source: Axillary (11/21 0717) BP: 91/72 (11/21 1016) Pulse Rate: 95 (11/21 1016)  Labs:  Recent Labs  12/07/15 0759 12/08/15 0448 12/08/15 0951  HGB  --  13.4  --   HCT  --  40.6  --   PLT  --  349  --   APTT  --   --  30  LABPROT 14.4  --   --   INR 1.12  --   --   HEPARINUNFRC  --   --  <0.10*  CREATININE  --  1.10  --   TROPONINI  --  0.03*  --     Estimated Creatinine Clearance: 109.3 mL/min (by C-G formula based on SCr of 1.1 mg/dL).   Medical History: Past Medical History:  Diagnosis Date  . Allergy to IVP dye   . Atrial fibrillation ([place])    admx 11/13 with acute sCHF in setting of RVR  => a. failed DCCV x 2; b. Pradaxa started;  c. failed sotalol  . Cardiomyopathy ([place])    likely tachy mediated in setting of AF with RVR  . Chicken pox as a child  . Chronic anticoagulation    Pradaxa  . Chronic systolic heart failure ([place])    a. echo 11/13: Ef 40-45%, diff HK, mod MR, mod LAE, mild RVE, mod RAE, small effusion;   b. TEE 11/13:  EF 35-40%, no LAA clot; Echo 2/14 shows normal EF  . Depression with anxiety 06/29/2011  . Dysrhythmia   . Hypertension   . 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 Migraine 06/29/2011  . Obesity 06/29/2011  . Snoring    patient needs sleep study - has declined      Assessment: 50 yo male with  A fib on rivaroxaban PTA, currently on hold post-op s/p colostomy on 11/20. Last dose 11/18 PTA. Pharmacy consulted for heparin dosing. Baseline heparin level undetectable, aPTT 30 - will monitor using only heparin levels. CBC WNL. No bleeding documented.  Goal of Therapy:  Heparin level 0.3-0.7 units/ml Monitor platelets by anticoagulation protocol: Yes   Plan:  Heparin infusion at 1550 units/hr  6 hr Heparin level Daily Heparin Level/CBC Monitor for signs/symptoms of bleeding   Elicia Lamp, PharmD, BCPS Clinical Pharmacist 12/08/2015 11:45 AM

## 2015-12-08 NOTE — Progress Notes (Signed)
Doing well this afternoon,dressing dry, discussed hernia repair with him and that there still is decent chance of recurrence but was able to close abdomen.

## 2015-12-08 NOTE — Progress Notes (Signed)
1 Day Post-Op Colostomy reversal  Subjective: Pt with pain issues overnight.  HR increased this am, EKG shows A fib  Objective: Vital signs in last 24 hours: Temp:  [97.3 F (36.3 C)-98.3 F (36.8 C)] 98.2 F (36.8 C) (11/21 0717) Pulse Rate:  [72-199] 145 (11/21 0600) Resp:  [12-26] 19 (11/21 0600) BP: (90-134)/(43-94) 98/56 (11/21 0717) SpO2:  [93 %-98 %] 97 % (11/21 0600) Weight:  [122.2 kg (269 lb 6.4 oz)] 122.2 kg (269 lb 6.4 oz) (11/20 1719)   Intake/Output from previous day: 11/20 0701 - 11/21 0700 In: 4101.7 [I.V.:3226.7; IV Piggyback:875] Out: 1045 [Urine:880; Emesis/NG output:15; Blood:150] Intake/Output this shift: No intake/output data recorded.   General appearance: alert and cooperative GI: soft, mildly distended  Incision: no significant drainage  Lab Results:   Recent Labs  12/08/15 0448  WBC 18.1*  HGB 13.4  HCT 40.6  PLT 349   BMET  Recent Labs  12/08/15 0448  NA 135  K 4.6  CL 101  CO2 26  GLUCOSE 183*  BUN 15  CREATININE 1.10  CALCIUM 9.1   PT/INR  Recent Labs  12/07/15 0759  LABPROT 14.4  INR 1.12   ABG No results for input(s): PHART, HCO3 in the last 72 hours.  Invalid input(s): PCO2, PO2  MEDS, Scheduled . acetaminophen  1,000 mg Oral Q6H  . alvimopan  12 mg Oral BID  . digoxin  0.125 mg Oral Daily  . enoxaparin (LOVENOX) injection  40 mg Subcutaneous Q24H  . lisinopril  5 mg Oral Daily  . metoprolol  10 mg Intravenous Q6H  . morphine   Intravenous Q4H  . piperacillin-tazobactam  3.375 g Intravenous Q8H    Studies/Results: Dg Chest Port 1 View  Result Date: 12/08/2015 CLINICAL DATA:  PICC line placement EXAM: PORTABLE CHEST 1 VIEW COMPARISON:  11/19/2015 FINDINGS: Right PICC line present with tip over the low SVC region. No pneumothorax. Enteric tube tip is off the field of view but below the left hemidiaphragm. Mild cardiac enlargement without vascular congestion. Linear atelectasis in the lung bases. Blunting  of the costophrenic angles suggesting small pleural effusions. No pneumothorax. IMPRESSION: PICC line tip projected over the low SVC region. No pneumothorax. Linear atelectasis in the lung bases with probable small bilateral pleural effusions. Electronically Signed   By: Lucienne Capers M.D.   On: 12/08/2015 06:58    Assessment: s/p Procedure(s): OPEN REVERSAL OF COLOSTOMY WITH PARTIAL SIGMOID COLECTOMY INCISIONAL HERNIA REPAIR Patient Active Problem List   Diagnosis Date Noted  . Intra-abdominal infection 11/17/2015  . NICM (nonischemic cardiomyopathy) ([place]) 08/03/2015  . 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 Chronic atrial fibrillation ([place])   . Sepsis ([place]) 07/25/2015  . Anticoagulated on Coumadin 04/21/2015  . Prediabetes 04/16/2015  . Normocytic anemia 04/16/2015  . 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 Abdominal abscess ([place])   . Septic shock ([place]) 04/12/2015  . 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 Acute venous embolism and thrombosis of deep vessels of proximal lower extremity ([place]) [I82.4Y9] 02/11/2015  . Long term (current) use of anticoagulants [Z79.01] 02/11/2015  . Monitoring for long-term anticoagulant use 02/11/2015  . Intra-abdominal abscess ([place])   . Bowel perforation ([place])   . Pressure ulcer 01/26/2015  . Pleural effusion   . Perforated bowel ([place])   . Diverticulitis of colon with perforation 01/14/2015  . AKI (acute kidney injury) ([place])   . Chronic systolic heart failure ([place]) 12/08/2011  . Morbid obesity ([place])  12/03/2011  . Depression with anxiety 06/29/2011  . Obesity 06/29/2011    Expected post op course  Plan: Pt with retroperitoneal infection from leaking colon stump: Pt was being treated with Zosyn pre-op and will cont for 48h post op and then d/c Afib: rate uncontrolled currently, suspect he is not absorbing PO meds.  Will switch to Dilt gtt and IV metoprolol.  Monitor trop levels.  Will call  cardiology if rate does not settle with this treatment.  Will start hep gtt for anticoagulation today Cont NG for suspected post op ileus.  Will follow outputs. Prediabetes: will start glucose checks q6h and give insulin as needed Pain control: will try a basal rate on PCA.  Pt aware we may have to stop this if BP goes too low   LOS: 1 day     .Rosario Adie, MD [facility], [place]   12/08/2015 8:19 AM

## 2015-12-08 NOTE — Evaluation (Signed)
Physical Therapy Evaluation Patient Details Name: William Fischer MRN: [id number] DOB: April 11, 1965 Today's Date: 12/08/2015   History of Present Illness  Patient is a 50 y/o male with hx of mesenteric embolus, HTN, depression, chronic systolic heart failure, A-fib and ischemic colitis in Dec 2016 s/p ex lap with hemicolectomy and colostomy placement presents with 3 weeks of malaise, increased colostomy output for 5 days, fever, chills and abdominal pain. CT abd-Reaccumulation of retroperitoneal fluid collection in the left posterior pararenal space, and he also has large ventral hernia s/p open reversal of colostomy with partial sigmoid colectomy and hernia repair.  Clinical Impression  Patient presents with pain, irritability, generalized weakness and impaired mobility s/p above. Pt reluctant to move and continually stating how he does not need PT until his pain gets better. Finally agreeable to mobility when told we would change his sheets. Pt wanting to do things his way throughout session despite cues for technique/safety. Education re: log roll technique, bracing. Will follow acutely to maximize independence and mobility prior to return home as pt able willing.    Follow Up Recommendations No PT follow up;Supervision - Intermittent    Equipment Recommendations  None recommended by PT    Recommendations for Other Services OT consult     Precautions / Restrictions Precautions Precautions: Fall Precaution Comments: NG tube, PCA, foley Restrictions Weight Bearing Restrictions: No      Mobility  Bed Mobility Overal bed mobility: Needs Assistance;+ 2 for safety/equipment Bed Mobility: Rolling;Sidelying to Sit Rolling: Min guard Sidelying to sit: Mod assist;HOB elevated       General bed mobility comments: Cues for log roll technique, pt able to roll without assist but then reaching for therapist to help get his trunk pulled up. + dizziness.  Transfers Overall transfer level:  Needs assistance Equipment used: 2 person hand held assist Transfers: Sit to/from [facility] Sit to Stand: Min assist;+2 physical assistance;+2 safety/equipment Stand pivot transfers: Min assist;+2 safety/equipment       General transfer comment: Despite cues for hand placement to push off surface, pt wanting therapist and tech to pull him to standing. SPT to chair with Min A to manage lines and for balance.  Ambulation/Gait                Stairs            Wheelchair Mobility    Modified Rankin (Stroke Patients Only)       Balance Overall balance assessment: Needs assistance Sitting-balance support: Feet supported;No upper extremity supported Sitting balance-Leahy Scale: Good     Standing balance support: During functional activity Standing balance-Leahy Scale: Fair                               Pertinent Vitals/Pain Pain Assessment: 0-10 Pain Score: 8  Pain Location: abdomen Pain Descriptors / Indicators: Sore;Operative site guarding Pain Intervention(s): Monitored during session;Repositioned;Premedicated before session;Limited activity within patient's tolerance;PCA encouraged    Home Living Family/patient expects to be discharged to:: Private residence Living Arrangements: Parent;Children (55 y/o son) Available Help at Discharge: Family Type of Home: House Home Access: Level entry     Home Layout: One level Home Equipment: None      Prior Function Level of Independence: Independent         Comments: drives, does own groceries, takes his son to school. Sleeps in recliner since colostomy.     Hand Dominance   Dominant Hand:  Right    Extremity/Trunk Assessment   Upper Extremity Assessment: Defer to OT evaluation           Lower Extremity Assessment: Generalized weakness         Communication   Communication: No difficulties  Cognition Arousal/Alertness: Awake/alert Behavior During Therapy: WFL  for tasks assessed/performed Overall Cognitive Status: Within Functional Limits for tasks assessed                      General Comments General comments (skin integrity, edema, etc.): VSS.    Exercises     Assessment/Plan    PT Assessment Patient needs continued PT services  PT Problem List Decreased strength;Decreased mobility;Decreased balance;Pain;Decreased activity tolerance          PT Treatment Interventions Gait training;Therapeutic exercise;Therapeutic activities;Patient/family education;Functional mobility training;Balance training;DME instruction    PT Goals (Current goals can be found in the Care Plan section)  Acute Rehab PT Goals Patient Stated Goal: to be able to sleep in his bed when he gets home  PT Goal Formulation: With patient Time For Goal Achievement: 12/22/15 Potential to Achieve Goals: Good    Frequency Min 3X/week   Barriers to discharge        Co-evaluation               End of Session Equipment Utilized During Treatment: Oxygen Activity Tolerance: Patient tolerated treatment well Patient left: in chair;with call bell/phone within reach Nurse Communication: Mobility status         Time: [id number] PT Time Calculation (min) (ACUTE ONLY): 29 min   Charges:   PT Evaluation $PT Eval Moderate Complexity: 1 Procedure PT Treatments $Therapeutic Activity: 8-22 mins   PT G Codes:        Sadeen Wiegel A Kennita Pavlovich 12/08/2015, 3:25 PM Wray Kearns, [place], DPT [phone]

## 2015-12-08 NOTE — Progress Notes (Addendum)
[place] for heparin  Indication: atrial fibrillation  Allergies  Allergen Reactions  . Contrast Media [Iodinated Diagnostic Agents] Rash    a diffuse macular rash after CTA chest  Pt was premedicated with 125mg  IV Solumedrol, 50mg  IV Benadryl 1 hr prior to CTexam, and tolerated procedure without any difficulties. 11/28/11 Also same pre med protocol observed on 02/22/15 and pt tolerated the procedure well    Patient Measurements: Height: 5\' 11"  (180.3 cm) Weight: 269 lb 6.4 oz (122.2 kg) IBW/kg (Calculated) : 75.3 Heparin Dosing Weight: 102.5kg  Vital Signs: Temp: 98 F (36.7 C) (11/21 1606) Temp Source: Oral (11/21 1606) BP: 100/32 (11/21 1700) Pulse Rate: 83 (11/21 1700)  Labs:  Recent Labs  12/07/15 0759 12/08/15 0448 12/08/15 0951 12/08/15 1415  HGB  --  13.4  --   --   HCT  --  40.6  --   --   PLT  --  349  --   --   APTT  --   --  30 39*  LABPROT 14.4  --   --   --   INR 1.12  --   --   --   HEPARINUNFRC  --   --  <0.10* 0.19*  CREATININE  --  1.10  --   --   TROPONINI  --  0.03*  --   --     Estimated Creatinine Clearance: 109.3 mL/min (by C-G formula based on SCr of 1.1 mg/dL).   Medical History: Past Medical History:  Diagnosis Date  . Allergy to IVP dye   . Atrial fibrillation ([place])    admx 11/13 with acute sCHF in setting of RVR  => a. failed DCCV x 2; b. Pradaxa started;  c. failed sotalol  . 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 Chicken pox as a child  . Chronic anticoagulation    Pradaxa  . Chronic systolic heart failure ([place])    a. echo 11/13: Ef 40-45%, diff HK, mod MR, mod LAE, mild RVE, mod RAE, small effusion;   b. TEE 11/13:  EF 35-40%, no LAA clot; Echo 2/14 shows normal EF  . Depression with anxiety 06/29/2011  . Dysrhythmia   . Hypertension   . Mesenteric embolus ([place]) 12/2014  . Migraine 06/29/2011  . Obesity 06/29/2011  . Snoring    patient needs sleep  study - has declined      Assessment: 50 yo male with A fib on rivaroxaban PTA, currently on hold post-op s/p colostomy reversal on 11/20. Last dose of Xarelto was 11/18 PTA. Pharmacy consulted for heparin dosing.   Patient initially started on heparin at 1550 units/hr.  Heparin level drawn 4 hours after start was 0.19 - sub therapeutic, but not at steady state.  Suspect level will rise some with time so will increase rate modestly.  Goal of Therapy:  Heparin level 0.3-0.7 units/ml Monitor platelets by anticoagulation protocol: Yes   Plan:  Increase heparin infusion to 1700 units/hr  6 hr Heparin level - midnight Daily Heparin Level/CBC Monitor for signs/symptoms of bleeding   [employer], Pharm.D., BCPS Clinical Pharmacist Pager [phone] 12/08/2015 6:03 PM

## 2015-12-08 NOTE — Progress Notes (Signed)
[facility]  Active pt with [facility] prior to this readmission  [facility] providing HHRN and Home Infusion Pharmacy services for home IV ABX:  Zosyn 3.375 Grams IV Q 8 hours x 4 weeks at home from 11-20-15.  [facility] hospital team will follow pt and support DC home when ordered.   If patient discharges after hours, please call [phone].   William Fischer 12/08/2015, 4:56 PM

## 2015-12-08 NOTE — Progress Notes (Signed)
ANTICOAGULATION CONSULT NOTE - Initial Consult  Pharmacy Consult for heparin  Indication: atrial fibrillation  Allergies  Allergen Reactions  . Contrast Media [Iodinated Diagnostic Agents] Rash    a diffuse macular rash after CTA chest  Pt was premedicated with 125mg  IV Solumedrol, 50mg  IV Benadryl 1 hr prior to CTexam, and tolerated procedure without any difficulties. 11/28/11 Also same pre med protocol observed on 02/22/15 and pt tolerated the procedure well    Patient Measurements: Height: 5\' 11"  (180.3 cm) Weight: 269 lb 6.4 oz (122.2 kg) IBW/kg (Calculated) : 75.3 Heparin Dosing Weight: 102.5kg  Vital Signs: Temp: 98.2 F (36.8 C) (11/21 0717) Temp Source: Axillary (11/21 0717) BP: 98/56 (11/21 0717) Pulse Rate: 145 (11/21 0600)  Labs:  Recent Labs  12/07/15 0759 12/08/15 0448  HGB  --  13.4  HCT  --  40.6  PLT  --  349  LABPROT 14.4  --   INR 1.12  --   CREATININE  --  1.10  TROPONINI  --  0.03*    Estimated Creatinine Clearance: 109.3 mL/min (by C-G formula based on SCr of 1.1 mg/dL).   Medical History: Past Medical History:  Diagnosis Date  . Allergy to IVP dye   . Atrial fibrillation ([place])    admx 11/13 with acute sCHF in setting of RVR  => a. failed DCCV x 2; b. Pradaxa started;  c. failed sotalol  . 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 Chicken pox as a child  . Chronic anticoagulation    Pradaxa  . Chronic systolic heart failure ([place])    a. echo 11/13: Ef 40-45%, diff HK, mod MR, mod LAE, mild RVE, mod RAE, small effusion;   b. TEE 11/13:  EF 35-40%, no LAA clot; Echo 2/14 shows normal EF  . Depression with anxiety 06/29/2011  . Dysrhythmia   . Hypertension   . Mesenteric embolus ([place]) 12/2014  . Migraine 06/29/2011  . Obesity 06/29/2011  . Snoring    patient needs sleep study - has declined      Assessment: 50 yo male with A fib on rivaroxaban PTA, currently on hold post-op. Last dose 11/18 PTA.  S/P  colostomy on 11/20.  Pharmacy consulted for heparin dosing. CBC WNL. No bleeding documented. Lovenox prophylaxis ordered but never given post-op - now D/C'd.  Goal of Therapy:  Heparin level 0.3-0.7 units/ml Monitor platelets by anticoagulation protocol: Yes   Plan:  No Bolus, Start heparin infusion at 1550 units/hr  Baseline aPTT, Heparin level 6 hr Heparin level/aPTT Daily Heparin Level/aPTT/CBC Monitor for signs/symptoms of bleeding  Annamary Rummage  PharmD Candidate 12/08/2015,8:39 AM

## 2015-12-08 NOTE — Progress Notes (Signed)
0505- Monitor alarming with pt's HR 170s-180s. Went to assess pt. Pt diaphoretic and complaining having pressure from needing to urinate. Explained he had a foley. Urine appears more cloudy than earlier in this shift with a small amount of pink tinged. Charge nurse notified. He grabbed the bladder scanner as nurse  Paged the surgeon on call. No response.   0515-Notified Rapid Response nurse. EKG done. Afib with HR 181. Multiple attempts by other staff nurses and charge nurse to contact on call surgeon.   0530-Call returned and order to start Cardizem drip. Then he called back and stated to give the Lopressor 5mg  and lets see what that does.  Medicine given.   0610-Called back with update. Stated to not start Cardizem drip at this time and to give AM heart meds early. MD made aware of pt's VS, NG output and constant complaints of pain.    0700- Goes in to give pt meds. Pt stated he pulled his IV out because it was caught in a cord. Crushed meds and administered via NG tube. Page IV team to start new IV and look at PICC line. Report given to oncoming nurse. Will continue to monitor. Surgeon at bedside.

## 2015-12-08 NOTE — Care Management Note (Signed)
Case Management Note  Patient Details  Name: EDSIL IRIGOYEN MRN: [id number] Date of Birth: 11-08-1965  Subjective/Objective:    S/p colostomy reversal active with [facility]  For [facility]. Was getting iv abx at home.  Butch Penny with [facility] notified patient is here at hospital.                 Action/Plan:   Expected Discharge Date:                  Expected Discharge Plan:  [place]  In-House Referral:     Discharge planning Services  CM Consult  Post Acute Care Choice:  Home Health Choice offered to:     DME Arranged:    DME Agency:     HH Arranged:  RN, IV Antibiotics HH Agency:  [place]  Status of Service:  In process, will continue to follow  If discussed at Long Length of Stay Meetings, dates discussed:    Additional Comments:  Zenon Mayo, RN 12/08/2015, 4:50 PM

## 2015-12-08 NOTE — Progress Notes (Signed)
PT Cancellation Note  Patient Details Name: William Fischer MRN: [id number] DOB: 1965/08/07   Cancelled Treatment:    Reason Eval/Treat Not Completed: Patient not medically ready Pt with elevated HR this AM in 170s-180s; Cardizem drip started and HR only down to 130-140s. Holding PT this AM. Will follow up as time allows.   Marguarite Arbour A Sheree Lalla 12/08/2015, 9:49 AM Wray Kearns, PT, DPT [phone]

## 2015-12-08 NOTE — Significant Event (Addendum)
Rapid Response Event Note Call received per floor RN regarding Pt having severe lower ABD pain and HR 180-200s Afib. Post OP #1 11/20 colostomy takedown hernia repair.  Overview: Time Called: 0515 Arrival Time: 0517 Event Type: Cardiac  Initial Focused Assessment: Pt found in bed, alert oriented. Complains of ABD pain 8/10 and presure in lower ABD. NGT draining about 200 ml out since surgery, brown drainage. Per floor RN brown drainage darker now since initial assessment.  Bladder scan completed with 0 ml found, FC draininig well, over 610ml output tonight. Urine pink cloudy. ABD tight but unchanged from assessment at start of shift per floor RN. Lungs clear. Pt diaphoretic and anxious. Admits to feeling no relief from pain since midnight. HR 180-200 BP 90/59 po2 95%, RR 20s.  Interventions: PCA morphine in use. NS bolus 250 ml given. EKG completed yielding Afib rate 180s. AM labs CBC/ BMET and Troponin placed STAT. Dr. Kieth Brightly paged several times. Call received, MD updated on Pt status and orders received for lopressor 5 mg. No further orders at that time. 5 mg Lopressor given IVP at 0545 . HR improved to 150-160s  MD paged at 0610,  updated on improved HR 140 s and VS and continued severe pain unrelieved in ABD.   Plan of Care (if not transferred): Per Dr. Kieth Brightly RN to give AM lopressor and Digoxin early and notify MD if HR is greater than 150 s after medications. Also PCXR ordered to confirm placement of PICC line. RN to change dressing from home. No further orders. Pt left resting in bed, appears to be nodding off. RN advised to monitor Pt closely and update Provider and myself for worsening changes.    Event Summary: Name of Physician Notified: Dr. Kieth Brightly paged prior to my arrival and  multiple times afterwards at [place]    at          [facility], [place]

## 2015-12-09 LAB — GLUCOSE, CAPILLARY
GLUCOSE-CAPILLARY: 143 mg/dL — AB (ref 65–99)
Glucose-Capillary: 147 mg/dL — ABNORMAL HIGH (ref 65–99)
Glucose-Capillary: 148 mg/dL — ABNORMAL HIGH (ref 65–99)
Glucose-Capillary: 117 mg/dL — ABNORMAL HIGH (ref 65–99)

## 2015-12-09 LAB — CBC
HCT: 28.3 % — ABNORMAL LOW (ref 39.0–52.0)
HEMOGLOBIN: 9.1 g/dL — AB (ref 13.0–17.0)
MCH: 29.2 pg (ref 26.0–34.0)
MCHC: 32.2 g/dL (ref 30.0–36.0)
MCV: 90.7 fL (ref 78.0–100.0)
PLATELETS: 287 10*3/uL (ref 150–400)
RBC: 3.12 MIL/uL — AB (ref 4.22–5.81)
RDW: 17 % — ABNORMAL HIGH (ref 11.5–15.5)
WBC: 15.8 10*3/uL — ABNORMAL HIGH (ref 4.0–10.5)

## 2015-12-09 LAB — HEPARIN LEVEL (UNFRACTIONATED)
HEPARIN UNFRACTIONATED: 0.25 [IU]/mL — AB (ref 0.30–0.70)
Heparin Unfractionated: 0.18 IU/mL — ABNORMAL LOW (ref 0.30–0.70)
Heparin Unfractionated: 0.31 IU/mL (ref 0.30–0.70)

## 2015-12-09 MED ORDER — ORAL CARE MOUTH RINSE
15.0000 mL | Freq: Two times a day (BID) | OROMUCOSAL | Status: DC
  Administered 2015-12-09 – 2015-12-15 (×9): 15 mL via OROMUCOSAL

## 2015-12-09 NOTE — Progress Notes (Signed)
[place] for heparin  Indication: atrial fibrillation  Allergies  Allergen Reactions  . Contrast Media [Iodinated Diagnostic Agents] Rash    a diffuse macular rash after CTA chest  Pt was premedicated with 125mg  IV Solumedrol, 50mg  IV Benadryl 1 hr prior to CTexam, and tolerated procedure without any difficulties. 11/28/11 Also same pre med protocol observed on 02/22/15 and pt tolerated the procedure well    Patient Measurements: Height: 5\' 11"  (180.3 cm) Weight: 269 lb 6.4 oz (122.2 kg) IBW/kg (Calculated) : 75.3 Heparin Dosing Weight: 102.5kg  Vital Signs: Temp: 98 F (36.7 C) (11/22 0830) Temp Source: Oral (11/22 0830) BP: 126/69 (11/22 0830) Pulse Rate: 55 (11/22 0830)  Labs:  Recent Labs  12/07/15 0759 12/08/15 0448  12/08/15 0951 12/08/15 1415 12/08/15 2331 12/09/15 0552 12/09/15 0835  HGB  --  13.4  --   --   --   --  9.1*  --   HCT  --  40.6  --   --   --   --  28.3*  --   PLT  --  349  --   --   --   --  287  --   APTT  --   --   --  30 39*  --   --   --   LABPROT 14.4  --   --   --   --   --   --   --   INR 1.12  --   --   --   --   --   --   --   HEPARINUNFRC  --   --   < > <0.10* 0.19* 0.25*  --  0.18*  CREATININE  --  1.10  --   --   --   --   --   --   TROPONINI  --  0.03*  --   --   --   --   --   --   < > = values in this interval not displayed.  Estimated Creatinine Clearance: 109.3 mL/min (by C-G formula based on SCr of 1.1 mg/dL).   Medical History: Past Medical History:  Diagnosis Date  . Allergy to IVP dye   . Atrial fibrillation ([place])    admx 11/13 with acute sCHF in setting of RVR  => a. failed DCCV x 2; b. Pradaxa started;  c. failed sotalol  . 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 Chicken pox as a child  . Chronic anticoagulation    Pradaxa  . Chronic systolic heart failure ([place])    a. echo 11/13: Ef 40-45%, diff HK, mod MR, mod LAE, mild RVE, mod RAE, small  effusion;   b. TEE 11/13:  EF 35-40%, no LAA clot; Echo 2/14 shows normal EF  . Depression with anxiety 06/29/2011  . Dysrhythmia   . Hypertension   . Mesenteric embolus ([place]) 12/2014  . Migraine 06/29/2011  . Obesity 06/29/2011  . Snoring    patient needs sleep study - has declined      Assessment: 50 yo male with A fib on rivaroxaban PTA, currently on hold post-op s/p colostomy on 11/20. Last dose 11/18 PTA. Pharmacy consulted for heparin dosing. Baseline heparin level undetectable, aPTT 30 - will monitor using only heparin levels. CBC WNL. No bleeding documented.  Heparin level remains subtherapeutic, slightly decreased to 0.18 after rate increase overnight. Per RN, no issues  with drip or bleeding.  Goal of Therapy:  Heparin level 0.3-0.7 units/ml Monitor platelets by anticoagulation protocol: Yes   Plan:  Increase heparin infusion to 2200 units/hr  6 hr Heparin level Daily Heparin Level/CBC Monitor for signs/symptoms of bleeding   Elicia Lamp, PharmD, BCPS Clinical Pharmacist 12/09/2015 10:12 AM

## 2015-12-09 NOTE — Progress Notes (Signed)
[place] for heparin  Indication: atrial fibrillation  Allergies  Allergen Reactions  . Contrast Media [Iodinated Diagnostic Agents] Rash    a diffuse macular rash after CTA chest  Pt was premedicated with 125mg  IV Solumedrol, 50mg  IV Benadryl 1 hr prior to CTexam, and tolerated procedure without any difficulties. 11/28/11 Also same pre med protocol observed on 02/22/15 and pt tolerated the procedure well    Patient Measurements: Height: 5\' 11"  (180.3 cm) Weight: 269 lb 6.4 oz (122.2 kg) IBW/kg (Calculated) : 75.3 Heparin Dosing Weight: 102.5kg  Vital Signs: Temp: 98.2 F (36.8 C) (11/22 1551) Temp Source: Oral (11/22 1551) BP: 121/60 (11/22 1551) Pulse Rate: 106 (11/22 1551)  Labs:  Recent Labs  12/07/15 0759 12/08/15 0448  12/08/15 0951 12/08/15 1415 12/08/15 2331 12/09/15 0552 12/09/15 0835 12/09/15 1630  HGB  --  13.4  --   --   --   --  9.1*  --   --   HCT  --  40.6  --   --   --   --  28.3*  --   --   PLT  --  349  --   --   --   --  287  --   --   APTT  --   --   --  30 39*  --   --   --   --   LABPROT 14.4  --   --   --   --   --   --   --   --   INR 1.12  --   --   --   --   --   --   --   --   HEPARINUNFRC  --   --   < > <0.10* 0.19* 0.25*  --  0.18* 0.31  CREATININE  --  1.10  --   --   --   --   --   --   --   TROPONINI  --  0.03*  --   --   --   --   --   --   --   < > = values in this interval not displayed.  Estimated Creatinine Clearance: 109.3 mL/min (by C-G formula based on SCr of 1.1 mg/dL).   Medical History: Past Medical History:  Diagnosis Date  . Allergy to IVP dye   . Atrial fibrillation ([place])    admx 11/13 with acute sCHF in setting of RVR  => a. failed DCCV x 2; b. Pradaxa started;  c. failed sotalol  . 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 Chicken pox as a child  . Chronic anticoagulation    Pradaxa  . Chronic systolic heart failure ([place])    a. echo  11/13: Ef 40-45%, diff HK, mod MR, mod LAE, mild RVE, mod RAE, small effusion;   b. TEE 11/13:  EF 35-40%, no LAA clot; Echo 2/14 shows normal EF  . Depression with anxiety 06/29/2011  . Dysrhythmia   . Hypertension   . Mesenteric embolus ([place]) 12/2014  . Migraine 06/29/2011  . Obesity 06/29/2011  . Snoring    patient needs sleep study - has declined      Assessment: 50 yo male with A fib on rivaroxaban PTA, currently on hold post-op s/p colostomy reversal on 11/20. Last dose 11/18 PTA. Pharmacy consulted for heparin dosing. Baseline heparin level undetectable, aPTT 30 -  will monitor using only heparin levels. CBC WNL. No bleeding documented.  Heparin level at low-end of therapeutic goal on 2200 units/hr. Will increase rate to target mid-range of therapeutic goal.    Goal of Therapy:  Heparin level 0.3-0.7 units/ml Monitor platelets by anticoagulation protocol: Yes   Plan:  Increase heparin infusion to 2400 units/hr  6 hr Heparin level for confirmation Daily Heparin Level/CBC Monitor for signs/symptoms of bleeding  Dierdre Harness, [place], PharmD Clinical Pharmacy Resident [phone] (Pager) 12/09/2015 6:40 PM   Horton Chin, Pharm.D., BCPS Clinical Pharmacist Pager [phone] 12/09/2015 7:01 PM

## 2015-12-09 NOTE — Progress Notes (Signed)
ANTICOAGULATION CONSULT NOTE - Follow Up Consult  Pharmacy Consult for heparin Indication: atrial fibrillation  Labs:  Recent Labs  12/07/15 0759 12/08/15 0448 12/08/15 0951 12/08/15 1415 12/08/15 2331  HGB  --  13.4  --   --   --   HCT  --  40.6  --   --   --   PLT  --  349  --   --   --   APTT  --   --  30 39*  --   LABPROT 14.4  --   --   --   --   INR 1.12  --   --   --   --   HEPARINUNFRC  --   --  <0.10* 0.19* 0.25*  CREATININE  --  1.10  --   --   --   TROPONINI  --  0.03*  --   --   --     Assessment: 50yo male remains subtherapeutic on heparin after rate increase.  Goal of Therapy:  Heparin level 0.3-0.7 units/ml   Plan:  Will increase heparin gtt by 1-2 units/kg/hr to 1900 units/hr and check level in [place], PharmD, BCPS  12/09/2015,12:45 AM

## 2015-12-09 NOTE — Progress Notes (Signed)
2 Days Post-Op Colostomy reversal  Subjective: Pain better today, HR more controlled.  BP ok.  Hgb down   Objective: Vital signs in last 24 hours: Temp:  [97.9 F (36.6 C)-98.4 F (36.9 C)] 98.2 F (36.8 C) (11/22 1156) Pulse Rate:  [48-115] 115 (11/22 1156) Resp:  [11-30] 21 (11/22 1320) BP: (93-131)/(32-75) 131/66 (11/22 1156) SpO2:  [91 %-100 %] 97 % (11/22 1320)   Intake/Output from previous day: 11/21 0701 - 11/22 0700 In: 2318.6 [I.V.:2098.6; IV Piggyback:200] Out: 2565 [Urine:440; Emesis/NG output:2100; Drains:25] Intake/Output this shift: Total I/O In: 665 [I.V.:615; IV Piggyback:50] Out: 200 [Urine:200]   General appearance: alert and cooperative GI: soft, mildly distended  Incision: no significant drainage, clean  Lab Results:   Recent Labs  12/08/15 0448 12/09/15 0552  WBC 18.1* 15.8*  HGB 13.4 9.1*  HCT 40.6 28.3*  PLT 349 287   BMET  Recent Labs  12/08/15 0448  NA 135  K 4.6  CL 101  CO2 26  GLUCOSE 183*  BUN 15  CREATININE 1.10  CALCIUM 9.1   PT/INR  Recent Labs  12/07/15 0759  LABPROT 14.4  INR 1.12   ABG No results for input(s): PHART, HCO3 in the last 72 hours.  Invalid input(s): PCO2, PO2  MEDS, Scheduled . alvimopan  12 mg Oral BID  . digoxin  0.125 mg Oral Daily  . insulin aspart  0-20 Units Subcutaneous TID WC  . insulin aspart  0-5 Units Subcutaneous QHS  . lisinopril  5 mg Oral Daily  . mouth rinse  15 mL Mouth Rinse BID  . metoprolol  10 mg Intravenous Q6H  . morphine   Intravenous Q4H  . piperacillin-tazobactam  3.375 g Intravenous Q8H    Studies/Results: Dg Chest Port 1 View  Result Date: 12/08/2015 CLINICAL DATA:  PICC line placement EXAM: PORTABLE CHEST 1 VIEW COMPARISON:  11/19/2015 FINDINGS: Right PICC line present with tip over the low SVC region. No pneumothorax. Enteric tube tip is off the field of view but below the left hemidiaphragm. Mild cardiac enlargement without vascular congestion. Linear  atelectasis in the lung bases. Blunting of the costophrenic angles suggesting small pleural effusions. No pneumothorax. IMPRESSION: PICC line tip projected over the low SVC region. No pneumothorax. Linear atelectasis in the lung bases with probable small bilateral pleural effusions. Electronically Signed   By: Lucienne Capers M.D.   On: 12/08/2015 06:58    Assessment: s/p Procedure(s): OPEN REVERSAL OF COLOSTOMY WITH PARTIAL SIGMOID COLECTOMY INCISIONAL HERNIA REPAIR Patient Active Problem List   Diagnosis Date Noted  . Intra-abdominal infection 11/17/2015  . NICM (nonischemic cardiomyopathy) ([place]) 08/03/2015  . 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 Chronic atrial fibrillation ([place])   . Sepsis ([place]) 07/25/2015  . Anticoagulated on Coumadin 04/21/2015  . Prediabetes 04/16/2015  . Normocytic anemia 04/16/2015  . 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 Abdominal abscess ([place])   . Septic shock ([place]) 04/12/2015  . Colostomy in place [facility]) 03/27/2015  . Acute venous embolism and thrombosis of deep vessels of proximal lower extremity ([place]) [I82.4Y9] 02/11/2015  . Long term (current) use of anticoagulants [Z79.01] 02/11/2015  . Monitoring for long-term anticoagulant use 02/11/2015  . Intra-abdominal abscess ([place])   . Bowel perforation ([place])   . Pressure ulcer 01/26/2015  . Pleural effusion   . Perforated bowel ([place])   . Diverticulitis of colon with perforation 01/14/2015  . AKI (acute kidney injury) ([place])   . Chronic systolic heart failure (  [place]) 12/08/2011  . Morbid obesity ([place]) 12/03/2011  . Depression with anxiety 06/29/2011  . Obesity 06/29/2011    Expected post op course  Plan: Pt with retroperitoneal infection from leaking colon stump: Pt was being treated with Zosyn pre-op and will cont for 48h post op and then d/c Afib: rate controlled currently, cont Dilt gtt and IV metoprolol.  Cont Hep gtt for now.  Will hold if hgb  continues to significantly drop tom Cont NG for post op ileus.  Will follow outputs. Prediabetes: glucose checks q6h and give insulin as needed Pain control: cont basal rate on PCA.  Pt aware we may have to stop this if BP goes too low   LOS: 2 days     .Rosario Adie, [facility], [place]   12/09/2015 1:48 PM

## 2015-12-10 LAB — CBC
HCT: 24.5 % — ABNORMAL LOW (ref 39.0–52.0)
HEMATOCRIT: 24.9 % — AB (ref 39.0–52.0)
HEMOGLOBIN: 7.7 g/dL — AB (ref 13.0–17.0)
HEMOGLOBIN: 8.1 g/dL — AB (ref 13.0–17.0)
MCH: 28.7 pg (ref 26.0–34.0)
MCH: 29.7 pg (ref 26.0–34.0)
MCHC: 31.4 g/dL (ref 30.0–36.0)
MCHC: 32.5 g/dL (ref 30.0–36.0)
MCV: 91.4 fL (ref 78.0–100.0)
MCV: 91.2 fL (ref 78.0–100.0)
Platelets: 277 10*3/uL (ref 150–400)
Platelets: 295 10*3/uL (ref 150–400)
RBC: 2.68 MIL/uL — ABNORMAL LOW (ref 4.22–5.81)
RBC: 2.73 MIL/uL — ABNORMAL LOW (ref 4.22–5.81)
RDW: 16.9 % — AB (ref 11.5–15.5)
RDW: 16.9 % — AB (ref 11.5–15.5)
WBC: 12.4 10*3/uL — ABNORMAL HIGH (ref 4.0–10.5)
WBC: 11.5 10*3/uL — AB (ref 4.0–10.5)

## 2015-12-10 LAB — GLUCOSE, CAPILLARY
GLUCOSE-CAPILLARY: 131 mg/dL — AB (ref 65–99)
Glucose-Capillary: 145 mg/dL — ABNORMAL HIGH (ref 65–99)
Glucose-Capillary: 120 mg/dL — ABNORMAL HIGH (ref 65–99)
Glucose-Capillary: 109 mg/dL — ABNORMAL HIGH (ref 65–99)

## 2015-12-10 LAB — BASIC METABOLIC PANEL
Anion gap: 7 (ref 5–15)
BUN: 15 mg/dL (ref 6–20)
CO2: 34 mmol/L — ABNORMAL HIGH (ref 22–32)
Calcium: 8.8 mg/dL — ABNORMAL LOW (ref 8.9–10.3)
Chloride: 97 mmol/L — ABNORMAL LOW (ref 101–111)
Creatinine, Ser: 1.48 mg/dL — ABNORMAL HIGH (ref 0.61–1.24)
GFR calc Af Amer: >60 mL/min (ref >60)
GFR, EST NON AFRICAN AMERICAN: 54 mL/min — AB (ref >60)
Glucose, Bld: 155 mg/dL — ABNORMAL HIGH (ref 65–99)
Potassium: 3.9 mmol/L (ref 3.5–5.1)
SODIUM: 138 mmol/L (ref 135–145)

## 2015-12-10 LAB — HEPARIN LEVEL (UNFRACTIONATED): HEPARIN UNFRACTIONATED: 0.25 [IU]/mL — AB (ref 0.30–0.70)

## 2015-12-10 MED ORDER — HEPARIN BOLUS VIA INFUSION
3000.0000 [IU] | Freq: Once | INTRAVENOUS | Status: AC
  Administered 2015-12-10: 3000 [IU] via INTRAVENOUS
  Filled 2015-12-10: qty 3000

## 2015-12-10 NOTE — Progress Notes (Signed)
ANTICOAGULATION CONSULT NOTE - Follow Up Consult  Pharmacy Consult for heparin Indication: atrial fibrillation  Labs:  Recent Labs  12/07/15 0759  12/08/15 0448  12/08/15 0951 12/08/15 1415  12/09/15 0552 12/09/15 0835 12/09/15 1630 12/10/15 0518  HGB  --   < > 13.4  --   --   --   --  9.1*  --   --  7.7*  HCT  --   --  40.6  --   --   --   --  28.3*  --   --  24.5*  PLT  --   --  349  --   --   --   --  287  --   --  277  APTT  --   --   --   --  30 39*  --   --   --   --   --   LABPROT 14.4  --   --   --   --   --   --   --   --   --   --   INR 1.12  --   --   --   --   --   --   --   --   --   --   HEPARINUNFRC  --   --   --   < > <0.10* 0.19*  < >  --  0.18* 0.31 0.25*  CREATININE  --   --  1.10  --   --   --   --   --   --   --  1.48*  TROPONINI  --   --  0.03*  --   --   --   --   --   --   --   --   < > = values in this interval not displayed.   Assessment: 50yo male now subtherapeutic on heparin despite rate increase last pm.  Goal of Therapy:  Heparin level 0.3-0.7 units/ml   Plan:  Will rebolus with heparin 3000 units IV bolus x1 and increase gtt by 2-3 units/kg/hr to 2700 units/hr and check level in [place], PharmD, BCPS  12/10/2015,6:11 AM

## 2015-12-10 NOTE — Progress Notes (Signed)
RN attempted 3 times to get patient up out of bed, patient refused and stated he was hooked to too many things, was in pain, had a tube out of his nose and didn't feel like doing it then, but maybe later. RN explained the importance of getting up and moving around, but patient still refused.

## 2015-12-10 NOTE — Progress Notes (Signed)
3 Days Post-Op  Subjective: No flatus, pain control good, got up to chair yesterday  Objective: Vital signs in last 24 hours: Temp:  [97.6 F (36.4 C)-98.2 F (36.8 C)] 98.2 F (36.8 C) (11/23 0700) Pulse Rate:  [102-115] 102 (11/23 0700) Resp:  [15-22] 22 (11/23 0800) BP: (111-131)/(58-66) 111/62 (11/23 0700) SpO2:  [93 %-100 %] 97 % (11/23 0800)    Intake/Output from previous day: 11/22 0701 - 11/23 0700 In: 2955 [I.V.:2905; IV Piggyback:50] Out: [id number] [Urine:1570; Emesis/NG output:1250; Drains:500] Intake/Output this shift: No intake/output data recorded.  General appearance: cooperative Resp: clear to auscultation bilaterally Cardio: irregularly irregular rhythm GI: soft, few BS, incision CDI  Lab Results:   Recent Labs  12/09/15 0552 12/10/15 0518  WBC 15.8* 12.4*  HGB 9.1* 7.7*  HCT 28.3* 24.5*  PLT 287 277   BMET  Recent Labs  12/08/15 0448 12/10/15 0518  NA 135 138  K 4.6 3.9  CL 101 97*  CO2 26 34*  GLUCOSE 183* 155*  BUN 15 15  CREATININE 1.10 1.48*  CALCIUM 9.1 8.8*   PT/INR No results for input(s): LABPROT, INR in the last 72 hours. ABG No results for input(s): PHART, HCO3 in the last 72 hours.  Invalid input(s): PCO2, PO2  Studies/Results: No results found.  Anti-infectives: Anti-infectives    Start     Dose/Rate Route Frequency Ordered Stop   12/07/15 2200  piperacillin-tazobactam (ZOSYN) IVPB 3.375 g     3.375 g 12.5 mL/hr over 240 Minutes Intravenous Every 8 hours 12/07/15 1733 12/10/15 0154   12/07/15 1000  polymyxin B 500,000 Units, bacitracin 50,000 Units in sodium chloride irrigation 0.9 % 500 mL irrigation  Status:  Discontinued       As needed 12/07/15 1001 12/07/15 1246   12/07/15 0728  cefoTEtan in Dextrose 5% (CEFOTAN) 2-2.08 GM-% IVPB    Comments:  [name], [name]   : cabinet override      12/07/15 0728 12/07/15 1944   12/07/15 0600  cefoTEtan (CEFOTAN) 2 g in dextrose 5 % 50 mL IVPB     2 g 100 mL/hr over 30 Minutes  Intravenous On call to O.R. 12/06/15 1404 12/07/15 0920      Assessment/Plan: S/P colostomy takedown and incisional hernia repair by Dr. Marcello Moores POD#3 Pt with retroperitoneal infection from leaking colon stump: completed 48h Zosyn Afib: rate controlled currently, cont Dilt gtt and IV metoprolol.  Stop heparin drip due to Hb drop ABL anemia - as above, stop heparin, CBC at 1400, TF if Hb under 7 Cont NG for post op ileus.  Will follow outputs. Prediabetes: glucose checks q6h and give insulin as needed Pain control: cont basal rate on PCA VTE - heparin drip to stop, PAS Dispo - SDU  LOS: 3 days    Janecia Palau E 12/10/2015

## 2015-12-10 NOTE — Progress Notes (Signed)
Patient is able to turn himself but he is refusing to do so. Pt was encouraged to roll to sides and staff offered multiple times for patient to get up to chair or take a walk. Patient refused all movement today. Will continue to offer to assist and give encouragement.

## 2015-12-11 LAB — GLUCOSE, CAPILLARY
GLUCOSE-CAPILLARY: 134 mg/dL — AB (ref 65–99)
Glucose-Capillary: 124 mg/dL — ABNORMAL HIGH (ref 65–99)
Glucose-Capillary: 121 mg/dL — ABNORMAL HIGH (ref 65–99)
Glucose-Capillary: 112 mg/dL — ABNORMAL HIGH (ref 65–99)

## 2015-12-11 LAB — BASIC METABOLIC PANEL
ANION GAP: 9 (ref 5–15)
BUN: 9 mg/dL (ref 6–20)
CO2: 31 mmol/L (ref 22–32)
Calcium: 9 mg/dL (ref 8.9–10.3)
Chloride: 98 mmol/L — ABNORMAL LOW (ref 101–111)
Creatinine, Ser: 1.08 mg/dL (ref 0.61–1.24)
GFR calc Af Amer: >60 mL/min (ref >60)
Glucose, Bld: 136 mg/dL — ABNORMAL HIGH (ref 65–99)
POTASSIUM: 3.8 mmol/L (ref 3.5–5.1)
SODIUM: 138 mmol/L (ref 135–145)

## 2015-12-11 LAB — CBC
HCT: 27.2 % — ABNORMAL LOW (ref 39.0–52.0)
Hemoglobin: 8.7 g/dL — ABNORMAL LOW (ref 13.0–17.0)
MCH: 29.6 pg (ref 26.0–34.0)
MCHC: 32 g/dL (ref 30.0–36.0)
MCV: 92.5 fL (ref 78.0–100.0)
PLATELETS: 325 10*3/uL (ref 150–400)
RBC: 2.94 MIL/uL — ABNORMAL LOW (ref 4.22–5.81)
RDW: 17.2 % — ABNORMAL HIGH (ref 11.5–15.5)
WBC: 10.4 10*3/uL (ref 4.0–10.5)

## 2015-12-11 NOTE — Progress Notes (Signed)
PT Cancellation Note  Patient Details Name: BAILEN MCCONVILLE MRN: [id number] DOB: 07/10/1965   Cancelled Treatment:    Reason Eval/Treat Not Completed: Pain limiting ability to participate.  Patient reports too painful today and declined PT - max encouragement provided and patient continued to decline.  Will return at later time.   Despina Pole 12/11/2015, 4:02 PM Carita Pian Sanjuana Kava, [place] Pager [phone]

## 2015-12-11 NOTE — Progress Notes (Signed)
4 Days Post-Op  Subjective: No signs of bleeding  No flatus   Objective: Vital signs in last 24 hours: Temp:  [98 F (36.7 C)-99 F (37.2 C)] 98 F (36.7 C) (11/24 0733) Pulse Rate:  [85-120] 85 (11/24 0733) Resp:  [15-29] 15 (11/24 0733) BP: (125-143)/(57-89) 125/83 (11/24 0733) SpO2:  [91 %-96 %] 92 % (11/24 0733)    Intake/Output from previous day: 11/23 0701 - 11/24 0700 In: -  Out: 3125 [id number]; Emesis/NG output:1200] Intake/Output this shift: No intake/output data recorded.  Incision/Wound:CDI distended quiet sore   Lab Results:   Recent Labs  12/10/15 1424 12/11/15 0545  WBC 11.5* 10.4  HGB 8.1* 8.7*  HCT 24.9* 27.2*  PLT 295 325   BMET  Recent Labs  12/10/15 0518 12/11/15 0545  NA 138 138  K 3.9 3.8  CL 97* 98*  CO2 34* 31  GLUCOSE 155* 136*  BUN 15 9  CREATININE 1.48* 1.08  CALCIUM 8.8* 9.0   PT/INR No results for input(s): LABPROT, INR in the last 72 hours. ABG No results for input(s): PHART, HCO3 in the last 72 hours.  Invalid input(s): PCO2, PO2  Studies/Results: No results found.  Anti-infectives: Anti-infectives    Start     Dose/Rate Route Frequency Ordered Stop   12/07/15 2200  piperacillin-tazobactam (ZOSYN) IVPB 3.375 g     3.375 g 12.5 mL/hr over 240 Minutes Intravenous Every 8 hours 12/07/15 1733 12/10/15 0154   12/07/15 1000  polymyxin B 500,000 Units, bacitracin 50,000 Units in sodium chloride irrigation 0.9 % 500 mL irrigation  Status:  Discontinued       As needed 12/07/15 1001 12/07/15 1246   12/07/15 0728  cefoTEtan in Dextrose 5% (CEFOTAN) 2-2.08 GM-% IVPB    Comments:  [name], [name]   : cabinet override      12/07/15 0728 12/07/15 1944   12/07/15 0600  cefoTEtan (CEFOTAN) 2 g in dextrose 5 % 50 mL IVPB     2 g 100 mL/hr over 30 Minutes Intravenous On call to O.R. 12/06/15 1404 12/07/15 0920      Assessment/Plan: s/p Procedure(s): OPEN REVERSAL OF COLOSTOMY WITH PARTIAL SIGMOID COLECTOMY (N/A) INCISIONAL  HERNIA REPAIR (N/A) Ileus OOB into chair Will transfer to floor in am Saturday Patient Active Problem List   Diagnosis Date Noted  . Intra-abdominal infection 11/17/2015  . NICM (nonischemic cardiomyopathy) ([place]) 08/03/2015  . 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 Chronic atrial fibrillation ([place])   . Sepsis ([place]) 07/25/2015  . Anticoagulated on Coumadin 04/21/2015  . Prediabetes 04/16/2015  . Normocytic anemia 04/16/2015  . 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 Abdominal abscess ([place])   . Septic shock ([place]) 04/12/2015  . Colostomy in place [facility]) 03/27/2015  . Acute venous embolism and thrombosis of deep vessels of proximal lower extremity ([place]) [I82.4Y9] 02/11/2015  . Long term (current) use of anticoagulants [Z79.01] 02/11/2015  . Monitoring for long-term anticoagulant use 02/11/2015  . Intra-abdominal abscess ([place])   . Bowel perforation ([place])   . Pressure ulcer 01/26/2015  . Pleural effusion   . Perforated bowel ([place])   . Diverticulitis of colon with perforation 01/14/2015  . AKI (acute kidney injury) ([place])   . Chronic systolic heart failure ([place]) 12/08/2011  . Morbid obesity ([place]) 12/03/2011  . Depression with anxiety 06/29/2011  . Obesity 06/29/2011    LOS: 4 days    Jadesola Poynter A. 12/11/2015

## 2015-12-12 LAB — CBC
HEMATOCRIT: 27.4 % — AB (ref 39.0–52.0)
HEMOGLOBIN: 8.7 g/dL — AB (ref 13.0–17.0)
MCH: 29.5 pg (ref 26.0–34.0)
MCHC: 31.8 g/dL (ref 30.0–36.0)
MCV: 92.9 fL (ref 78.0–100.0)
Platelets: 343 10*3/uL (ref 150–400)
RBC: 2.95 MIL/uL — AB (ref 4.22–5.81)
RDW: 17 % — ABNORMAL HIGH (ref 11.5–15.5)
WBC: 12 10*3/uL — ABNORMAL HIGH (ref 4.0–10.5)

## 2015-12-12 LAB — BASIC METABOLIC PANEL
Anion gap: 7 (ref 5–15)
BUN: 9 mg/dL (ref 6–20)
CHLORIDE: 98 mmol/L — AB (ref 101–111)
CO2: 33 mmol/L — AB (ref 22–32)
Calcium: 9 mg/dL (ref 8.9–10.3)
Creatinine, Ser: 1.01 mg/dL (ref 0.61–1.24)
GFR calc Af Amer: >60 mL/min (ref >60)
GFR calc non Af Amer: >60 mL/min (ref >60)
Glucose, Bld: 141 mg/dL — ABNORMAL HIGH (ref 65–99)
POTASSIUM: 3.8 mmol/L (ref 3.5–5.1)
SODIUM: 138 mmol/L (ref 135–145)

## 2015-12-12 LAB — GLUCOSE, CAPILLARY
GLUCOSE-CAPILLARY: 135 mg/dL — AB (ref 65–99)
GLUCOSE-CAPILLARY: 138 mg/dL — AB (ref 65–99)
Glucose-Capillary: 121 mg/dL — ABNORMAL HIGH (ref 65–99)
Glucose-Capillary: 126 mg/dL — ABNORMAL HIGH (ref 65–99)

## 2015-12-12 LAB — HEPARIN LEVEL (UNFRACTIONATED): HEPARIN UNFRACTIONATED: 0.12 [IU]/mL — AB (ref 0.30–0.70)

## 2015-12-12 MED ORDER — HEPARIN (PORCINE) IN NACL 100-0.45 UNIT/ML-% IJ SOLN
3000.0000 [IU]/h | INTRAMUSCULAR | Status: AC
  Administered 2015-12-12: 2400 [IU]/h via INTRAVENOUS
  Administered 2015-12-13: 3000 [IU]/h via INTRAVENOUS
  Filled 2015-12-12 (×3): qty 250

## 2015-12-12 MED ORDER — HEPARIN BOLUS VIA INFUSION
2000.0000 [IU] | Freq: Once | INTRAVENOUS | Status: AC
  Administered 2015-12-12: 2000 [IU] via INTRAVENOUS
  Filled 2015-12-12: qty 2000

## 2015-12-12 NOTE — Progress Notes (Signed)
5 Days Post-Op  Subjective: C/O L arm IV beeping. Passed a lot of gas last night and this AM.  Objective: Vital signs in last 24 hours: Temp:  [98.1 F (36.7 C)-98.9 F (37.2 C)] 98.1 F (36.7 C) (11/25 1137) Pulse Rate:  [83-105] 97 (11/25 1137) Resp:  [14-24] 14 (11/25 1137) BP: (110-145)/(55-89) 130/76 (11/25 1137) SpO2:  [92 %-100 %] 95 % (11/25 1137) Weight:  [121.5 kg (267 lb 13.7 oz)] 121.5 kg (267 lb 13.7 oz) (11/25 0315)    Intake/Output from previous day: 11/24 0701 - 11/25 0700 In: 4873.2 [I.V.:4598.2; NG/GT:275] Out: 1805 [Urine:1100; Emesis/NG output:700; Drains:5] Intake/Output this shift: Total I/O In: -  Out: 575 [Urine:575]  General appearance: cooperative Resp: clear to auscultation bilaterally Cardio: regular rate and rhythm GI: soft, incisions CDI, active BS Correct cardio: irreg irreg Lab Results:   Recent Labs  12/11/15 0545 12/12/15 0500  WBC 10.4 12.0*  HGB 8.7* 8.7*  HCT 27.2* 27.4*  PLT 325 343   BMET  Recent Labs  12/11/15 0545 12/12/15 0500  NA 138 138  K 3.8 3.8  CL 98* 98*  CO2 31 33*  GLUCOSE 136* 141*  BUN 9 9  CREATININE 1.08 1.01  CALCIUM 9.0 9.0   PT/INR No results for input(s): LABPROT, INR in the last 72 hours. ABG No results for input(s): PHART, HCO3 in the last 72 hours.  Invalid input(s): PCO2, PO2  Studies/Results: No results found.  Anti-infectives: Anti-infectives    Start     Dose/Rate Route Frequency Ordered Stop   12/07/15 2200  piperacillin-tazobactam (ZOSYN) IVPB 3.375 g     3.375 g 12.5 mL/hr over 240 Minutes Intravenous Every 8 hours 12/07/15 1733 12/10/15 0154   12/07/15 1000  polymyxin B 500,000 Units, bacitracin 50,000 Units in sodium chloride irrigation 0.9 % 500 mL irrigation  Status:  Discontinued       As needed 12/07/15 1001 12/07/15 1246   12/07/15 0728  cefoTEtan in Dextrose 5% (CEFOTAN) 2-2.08 GM-% IVPB    Comments:  [name], [name]   : cabinet override      12/07/15 0728 12/07/15  1944   12/07/15 0600  cefoTEtan (CEFOTAN) 2 g in dextrose 5 % 50 mL IVPB     2 g 100 mL/hr over 30 Minutes Intravenous On call to O.R. 12/06/15 1404 12/07/15 0920      Assessment/Plan: S/P colostomy takedown and incisional hernia repair by Dr. Marcello Moores POD#5 Pt with retroperitoneal infection from leaking colon stump: completed 48h Zosyn Afib: rate controlled currently, cont Dilt gtt and IV metoprolol, resume heparin today ABL anemia - this has now stabilized, resume heparin, CBC in AM Prediabetes: glucose checks q6h, SSI Pain control: cont PCA VTE - heparin drip FEN - IV team to place new IV for heparin drip, D/C NGT and start clears Dispo - SDU  LOS: 5 days    William Fischer E 12/12/2015

## 2015-12-12 NOTE — Progress Notes (Signed)
ANTICOAGULATION CONSULT NOTE - Follow Up Consult  Pharmacy Consult for heparin Indication: atrial fibrillation   Heparin Dosing Weight: 102.5 kg.  Labs:  Recent Labs  12/10/15 0518 12/10/15 1424 12/11/15 0545 12/12/15 0500 12/12/15 1515  HGB 7.7* 8.1* 8.7* 8.7*  --   HCT 24.5* 24.9* 27.2* 27.4*  --   PLT 277 295 325 343  --   HEPARINUNFRC 0.25*  --   --   --  0.12*  CREATININE 1.48*  --  1.08 1.01  --      Assessment: 50yo male on xarelto PTA on hold post-op colostomy reversal. Last xarelto dose 11/18. He was initially started on heparin, however his hemoglobin dropped to 7.7, and heparin was discontinued. Pharmacy now consulted to restart heparin.  Initial HL after restart is subtherapeutic at 0.12 on heparin 2400 units/hr. Nurse reports no issues with infusion or bleeding.  Goal of Therapy:  Heparin level 0.3-0.7 units/ml   Plan:  Bolus heparin 2000 units and increase infusion to 2700 units/hr 6h HL Daily HL/CBC Monitor for signs and symptoms of bleeding  Andrey Cota. Diona Foley, PharmD, [place] Clinical Pharmacist Pager [phone] 12/12/2015 4:42 PM

## 2015-12-12 NOTE — Progress Notes (Signed)
Physical Therapy Treatment Patient Details Name: William Fischer MRN: [id number] DOB: 10/06/1965 Today's Date: 12/12/2015    History of Present Illness Patient is a 50 y/o male with hx of mesenteric embolus, HTN, depression, chronic systolic heart failure, A-fib and ischemic colitis in Dec 2016 s/p ex lap with hemicolectomy and colostomy placement presents with 3 weeks of malaise, increased colostomy output for 5 days, fever, chills and abdominal pain. CT abd-Reaccumulation of retroperitoneal fluid collection in the left posterior pararenal space, and he also has large ventral hernia s/p open reversal of colostomy with partial sigmoid colectomy and hernia repair.    PT Comments    Patient making progress with mobility and gait.  Should progress well with decrease in pain level.  Follow Up Recommendations  No PT follow up;Supervision - Intermittent     Equipment Recommendations  None recommended by PT    Recommendations for Other Services OT consult     Precautions / Restrictions Precautions Precautions: Fall Precaution Comments: PCA Restrictions Weight Bearing Restrictions: No    Mobility  Bed Mobility Overal bed mobility: Needs Assistance;+ 2 for safety/equipment Bed Mobility: Supine to Sit     Supine to sit: Min guard;+2 for safety/equipment     General bed mobility comments: Assist for safety and lines  Transfers Overall transfer level: Needs assistance Equipment used: None Transfers: Sit to/from Stand Sit to Stand: Min guard;+2 safety/equipment         General transfer comment: Patient able to move to stance with no physical assist.    Ambulation/Gait Ambulation/Gait assistance: Min assist;+2 safety/equipment Ambulation Distance (Feet): 4 Feet Assistive device: None Gait Pattern/deviations: Step-through pattern;Decreased stride length;Shuffle Gait velocity: decreased Gait velocity interpretation: Below normal speed for age/gender General Gait Details:  Patient able to ambulate 4' with no assistive device to chair.  Assist to steady during gait and for lines.   Stairs            Wheelchair Mobility    Modified Rankin (Stroke Patients Only)       Balance           Standing balance support: No upper extremity supported Standing balance-Leahy Scale: Fair                      Cognition Arousal/Alertness: Awake/alert Behavior During Therapy: WFL for tasks assessed/performed;Flat affect Overall Cognitive Status: Within Functional Limits for tasks assessed                      Exercises      General Comments        Pertinent Vitals/Pain Pain Assessment: 0-10 Pain Score: 9  (following mobility) Pain Location: abdomen Pain Descriptors / Indicators: Sore;Operative site guarding Pain Intervention(s): Monitored during session;Repositioned;PCA encouraged    Home Living                      Prior Function            PT Goals (current goals can now be found in the care plan section) Progress towards PT goals: Progressing toward goals    Frequency    Min 3X/week      PT Plan Current plan remains appropriate    Co-evaluation             End of Session   Activity Tolerance: Patient limited by pain;Patient limited by fatigue Patient left: in chair;with call bell/phone within reach     Time: [id number] PT Time  Calculation (min) (ACUTE ONLY): 16 min  Charges:  $Therapeutic Activity: 8-22 mins                    G Codes:      Despina Pole 2015-12-22, 2:51 PM Carita Pian. Sanjuana Kava, [place] Pager [phone]

## 2015-12-12 NOTE — Progress Notes (Signed)
Patient lost PIV site. IV team used the ultrasound and was able to get another site. IV Heparin was started but patient reports that he is not happy with IV placement and having IV heparin. Patient asked for RN to call surgeon on call and see if the Heparin can be switched to something subcutaneous. Paged Dr. Grandville Silos and he stated that he will talk to patient about it when he rounds.

## 2015-12-12 NOTE — Progress Notes (Addendum)
ANTICOAGULATION CONSULT NOTE - Follow Up Consult  Pharmacy Consult for heparin Indication: atrial fibrillation   Heparin Dosing Weight: 102.5 kg.  Labs:  Recent Labs  12/09/15 1630  12/10/15 0518 12/10/15 1424 12/11/15 0545 12/12/15 0500  HGB  --   < > 7.7* 8.1* 8.7* 8.7*  HCT  --   < > 24.5* 24.9* 27.2* 27.4*  PLT  --   < > 277 295 325 343  HEPARINUNFRC 0.31  --  0.25*  --   --   --   CREATININE  --   --  1.48*  --  1.08 1.01  < > = values in this interval not displayed.   Assessment: 50yo male on xarelto PTA on hold post-op colostomy reversal. Last xarelto dose 11/18. He was initially started on heparin, however his hemoglobin dropped to 7.7, and heparin was discontinued. Pharmacy now consulted to restart heparin without a bolus.   Therapeutic concentrations have been difficult to achieve based on body habitus. Last dose of 2400 units/hr led to a lower heparin level than before dose increase. Patient was rebolused with 3000 units and rate was increased to 2700 units/hr, however level was not assessed due to drop in hemoglobin and subsequent discontinuation.  As heparin was discontinued based on hemoglobin drop, will initiate at 2400 units/hr and monitor closely. Hgb 7.7>8.7, pltc wnl. No signs or symptoms of bleeding per nurse  Goal of Therapy:  Heparin level 0.3-0.7 units/ml   Plan:  Restart heparin at 2400 units/hr Monitor 6 hour heparin level Daily CBC and Heparin Level Monitor for signs and symptoms of bleeding  Dierdre Harness, BS, PharmD Clinical Pharmacy Resident [phone] (Pager) 12/12/2015 9:45 AM

## 2015-12-13 LAB — CBC
HEMATOCRIT: 26.4 % — AB (ref 39.0–52.0)
HEMOGLOBIN: 8.7 g/dL — AB (ref 13.0–17.0)
MCH: 30.3 pg (ref 26.0–34.0)
MCHC: 33 g/dL (ref 30.0–36.0)
MCV: 92 fL (ref 78.0–100.0)
Platelets: 292 10*3/uL (ref 150–400)
RBC: 2.87 MIL/uL — ABNORMAL LOW (ref 4.22–5.81)
RDW: 17 % — AB (ref 11.5–15.5)
WBC: 11.1 10*3/uL — ABNORMAL HIGH (ref 4.0–10.5)

## 2015-12-13 LAB — BASIC METABOLIC PANEL
ANION GAP: 9 (ref 5–15)
BUN: 7 mg/dL (ref 6–20)
CHLORIDE: 100 mmol/L — AB (ref 101–111)
CO2: 28 mmol/L (ref 22–32)
Calcium: 8.6 mg/dL — ABNORMAL LOW (ref 8.9–10.3)
Creatinine, Ser: 0.9 mg/dL (ref 0.61–1.24)
GFR calc Af Amer: >60 mL/min (ref >60)
GLUCOSE: 133 mg/dL — AB (ref 65–99)
POTASSIUM: 3.5 mmol/L (ref 3.5–5.1)
Sodium: 137 mmol/L (ref 135–145)

## 2015-12-13 LAB — GLUCOSE, CAPILLARY
GLUCOSE-CAPILLARY: 112 mg/dL — AB (ref 65–99)
GLUCOSE-CAPILLARY: 107 mg/dL — AB (ref 65–99)
GLUCOSE-CAPILLARY: 110 mg/dL — AB (ref 65–99)
Glucose-Capillary: 143 mg/dL — ABNORMAL HIGH (ref 65–99)

## 2015-12-13 LAB — HEPARIN LEVEL (UNFRACTIONATED)
Heparin Unfractionated: 0.24 IU/mL — ABNORMAL LOW (ref 0.30–0.70)
Heparin Unfractionated: 0.37 IU/mL (ref 0.30–0.70)

## 2015-12-13 MED ORDER — METOPROLOL TARTRATE 50 MG PO TABS
50.0000 mg | ORAL_TABLET | Freq: Two times a day (BID) | ORAL | Status: DC
  Administered 2015-12-13 – 2015-12-15 (×5): 50 mg via ORAL
  Filled 2015-12-13 (×5): qty 1

## 2015-12-13 MED ORDER — DILTIAZEM HCL ER COATED BEADS 180 MG PO CP24
180.0000 mg | ORAL_CAPSULE | Freq: Every day | ORAL | Status: DC
  Administered 2015-12-13 – 2015-12-15 (×3): 180 mg via ORAL
  Filled 2015-12-13 (×3): qty 1

## 2015-12-13 MED ORDER — RIVAROXABAN 20 MG PO TABS
20.0000 mg | ORAL_TABLET | Freq: Every day | ORAL | Status: DC
  Administered 2015-12-13 – 2015-12-14 (×2): 20 mg via ORAL
  Filled 2015-12-13 (×2): qty 1

## 2015-12-13 MED ORDER — DIGOXIN 125 MCG PO TABS: 0.1250 mg | ORAL_TABLET | Freq: Every day | ORAL | Status: DC

## 2015-12-13 NOTE — Progress Notes (Addendum)
ANTICOAGULATION CONSULT NOTE - Follow Up Consult  Pharmacy Consult for Xarelto Indication: atrial fibrillation  Allergies  Allergen Reactions  . Contrast Media [Iodinated Diagnostic Agents] Rash    a diffuse macular rash after CTA chest  Pt was premedicated with 125mg  IV Solumedrol, 50mg  IV Benadryl 1 hr prior to CTexam, and tolerated procedure without any difficulties. 11/28/11 Also same pre med protocol observed on 02/22/15 and pt tolerated the procedure well    Patient Measurements: Height: 5\' 11"  (180.3 cm) Weight: 267 lb 13.7 oz (121.5 kg) IBW/kg (Calculated) : 75.3  Vital Signs: Temp: 98 F (36.7 C) (11/26 0801) Temp Source: Oral (11/26 0801) BP: 144/79 (11/26 0801) Pulse Rate: 91 (11/26 0801)  Labs:  Recent Labs  12/11/15 0545 12/12/15 0500 12/12/15 1515 12/13/15 0052  HGB 8.7* 8.7*  --  8.7*  HCT 27.2* 27.4*  --  26.4*  PLT 325 343  --  292  HEPARINUNFRC  --   --  0.12* 0.24*  CREATININE 1.08 1.01  --  0.90    Estimated Creatinine Clearance: 133.2 mL/min (by C-G formula based on SCr of 0.9 mg/dL).  Assessment: 50yo male on xarelto PTA for afib - on hold post-op colostomy reversal. Last xarelto dose 11/18.   Pharmacy now consulted to transition back to xarelto. Hgb stable and pltc wnl. No signs/symptoms of bleeding per nurse.  Last HL 0.24 and Heparin increased to 3000/hr. Will restart xarelto tonight with supper and turn off heparin at that point. Nurse knows to shut off heparin drip at that time.    Plan:  Xarelto 20mg  daily starting tonight with supper Heparin 3000 units/hr - D/C when starting xarelto [id number] Monitor s/sx of bleeding  Dierdre Harness, Cain Sieve, PharmD Clinical Pharmacy Resident [phone] (Pager) 12/13/2015 11:23 AM   Addendum 1000 HL therapeutic. Okay to continue at current rate until xarelto started this evening.  Dierdre Harness, Cain Sieve, PharmD Clinical Pharmacy Resident [phone] (Pager) 12/13/2015 12:01 PM

## 2015-12-13 NOTE — Progress Notes (Signed)
[place] for heparin Indication: atrial fibrillation   Heparin Dosing Weight: 102.5 kg.   Labs:  Recent Labs  12/10/15 0518  12/11/15 0545 12/12/15 0500 12/12/15 1515 12/13/15 0052  HGB 7.7*  < > 8.7* 8.7*  --  8.7*  HCT 24.5*  < > 27.2* 27.4*  --  26.4*  PLT 277  < > 325 343  --  292  HEPARINUNFRC 0.25*  --   --   --  0.12* 0.24*  CREATININE 1.48*  --  1.08 1.01  --  0.90  < > = values in this interval not displayed.   Assessment: 50 yo male with h/o Afib, Xarelto on hold, for heparin  Goal of Therapy:  Heparin level 0.3-0.7 units/ml   Plan:  Increase Heparin  3000 units/hr Check heparin level in 8 hours.   Phillis Knack, PharmD, BCPS  12/13/2015 1:58 AM

## 2015-12-13 NOTE — Progress Notes (Signed)
6 Days Post-Op  Subjective: PASSING GAS AND BM   Objective: Vital signs in last 24 hours: Temp:  [98 F (36.7 C)-99.1 F (37.3 C)] 98 F (36.7 C) (11/26 0801) Pulse Rate:  [86-101] 91 (11/26 0801) Resp:  [14-24] 20 (11/26 0802) BP: (119-144)/(76-85) 144/79 (11/26 0801) SpO2:  [92 %-99 %] 99 % (11/26 0802)    Intake/Output from previous day: 11/25 0701 - 11/26 0700 In: 3789.2 [P.O.:120; I.V.:3669.2] Out: 1525 [Urine:1525] Intake/Output this shift: No intake/output data recorded.  Resp: clear to auscultation bilaterally Cardio: a fib GI: incision CDI soft BS present wants drain out   Lab Results:   Recent Labs  12/12/15 0500 12/13/15 0052  WBC 12.0* 11.1*  HGB 8.7* 8.7*  HCT 27.4* 26.4*  PLT 343 292   BMET  Recent Labs  12/12/15 0500 12/13/15 0052  NA 138 137  K 3.8 3.5  CL 98* 100*  CO2 33* 28  GLUCOSE 141* 133*  BUN 9 7  CREATININE 1.01 0.90  CALCIUM 9.0 8.6*   PT/INR No results for input(s): LABPROT, INR in the last 72 hours. ABG No results for input(s): PHART, HCO3 in the last 72 hours.  Invalid input(s): PCO2, PO2  Studies/Results: No results found.  Anti-infectives: Anti-infectives    Start     Dose/Rate Route Frequency Ordered Stop   12/07/15 2200  piperacillin-tazobactam (ZOSYN) IVPB 3.375 g     3.375 g 12.5 mL/hr over 240 Minutes Intravenous Every 8 hours 12/07/15 1733 12/10/15 0154   12/07/15 1000  polymyxin B 500,000 Units, bacitracin 50,000 Units in sodium chloride irrigation 0.9 % 500 mL irrigation  Status:  Discontinued       As needed 12/07/15 1001 12/07/15 1246   12/07/15 0728  cefoTEtan in Dextrose 5% (CEFOTAN) 2-2.08 GM-% IVPB    Comments:  [name], [name]   : cabinet override      12/07/15 0728 12/07/15 1944   12/07/15 0600  cefoTEtan (CEFOTAN) 2 g in dextrose 5 % 50 mL IVPB     2 g 100 mL/hr over 30 Minutes Intravenous On call to O.R. 12/06/15 1404 12/07/15 0920      Assessment/Plan: s/p Procedure(s): OPEN REVERSAL OF  COLOSTOMY WITH PARTIAL SIGMOID COLECTOMY (N/A) INCISIONAL HERNIA REPAIR (N/A) Restart home meds  Keep in SDU for today  Ask pharmacy to see for xarelto since HGB stable to transition back to Xarelto   And off Heparin   LOS: 6 days    William Fischer A. 12/13/2015

## 2015-12-14 LAB — GLUCOSE, CAPILLARY
GLUCOSE-CAPILLARY: 138 mg/dL — AB (ref 65–99)
GLUCOSE-CAPILLARY: 103 mg/dL — AB (ref 65–99)
GLUCOSE-CAPILLARY: 147 mg/dL — AB (ref 65–99)
GLUCOSE-CAPILLARY: 110 mg/dL — AB (ref 65–99)

## 2015-12-14 LAB — BASIC METABOLIC PANEL
ANION GAP: 10 (ref 5–15)
BUN: 5 mg/dL — ABNORMAL LOW (ref 6–20)
CHLORIDE: 99 mmol/L — AB (ref 101–111)
CO2: 29 mmol/L (ref 22–32)
CREATININE: 0.97 mg/dL (ref 0.61–1.24)
Calcium: 8.9 mg/dL (ref 8.9–10.3)
GFR calc non Af Amer: >60 mL/min (ref >60)
Glucose, Bld: 123 mg/dL — ABNORMAL HIGH (ref 65–99)
Potassium: 3.6 mmol/L (ref 3.5–5.1)
SODIUM: 138 mmol/L (ref 135–145)

## 2015-12-14 MED ORDER — METHOCARBAMOL 750 MG PO TABS
750.0000 mg | ORAL_TABLET | Freq: Four times a day (QID) | ORAL | Status: DC | PRN
  Administered 2015-12-14: 750 mg via ORAL
  Filled 2015-12-14: qty 1

## 2015-12-14 MED ORDER — MORPHINE SULFATE (PF) 2 MG/ML IV SOLN
2.0000 mg | INTRAVENOUS | Status: DC | PRN
Start: 2015-12-14 — End: 2015-12-15
  Administered 2015-12-14 – 2015-12-15 (×2): 2 mg via INTRAVENOUS
  Filled 2015-12-14 (×2): qty 1

## 2015-12-14 MED ORDER — ACETAMINOPHEN 325 MG PO TABS: 650.0000 mg | ORAL_TABLET | Freq: Four times a day (QID) | ORAL | Status: DC | PRN

## 2015-12-14 MED ORDER — OXYCODONE HCL 5 MG PO TABS
10.0000 mg | ORAL_TABLET | ORAL | Status: DC | PRN
  Administered 2015-12-14 – 2015-12-15 (×4): 10 mg via ORAL
  Filled 2015-12-14 (×4): qty 2

## 2015-12-14 NOTE — Discharge Instructions (Signed)

## 2015-12-14 NOTE — Progress Notes (Signed)
Physical Therapy Treatment Patient Details Name: William Fischer MRN: [id number] DOB: [birth year] Today's Date: 12/14/2015    History of Present Illness Patient is a 50 y/o male with hx of mesenteric embolus, HTN, depression, chronic systolic heart failure, A-fib and ischemic colitis in Dec 2016 s/p ex lap with hemicolectomy and colostomy placement presents with 3 weeks of malaise, increased colostomy output for 5 days, fever, chills and abdominal pain. CT abd-Reaccumulation of retroperitoneal fluid collection in the left posterior pararenal space, and he also has large ventral hernia s/p open reversal of colostomy with partial sigmoid colectomy and hernia repair.    PT Comments    Patient progressing well towards PT goals. Pain is more controlled today. Tolerated ambulating 150' with Min guard assist for safety. Required forced standing rest break due to tachycardia with HR up to 176 bpm during activity. Education re: self monitoring activity, abdominal binder and activity progression. Encouraged ambulation a few more times today with RN. Pt will have support from 31 y/o son at home. Will follow.   Follow Up Recommendations  No PT follow up;Supervision - Intermittent     Equipment Recommendations  None recommended by PT    Recommendations for Other Services       Precautions / Restrictions Precautions Precautions: None Precaution Comments: watch HR Required Braces or Orthoses: Other Brace/Splint Other Brace/Splint: abdominal binder- donned when OOB and walking Restrictions Weight Bearing Restrictions: No    Mobility  Bed Mobility               General bed mobility comments: Sitting EOB upon PT arrival.   Transfers Overall transfer level: Needs assistance Equipment used: None Transfers: Sit to/from Stand Sit to Stand: Supervision         General transfer comment: Supervision for safety. Stood from EOB x3. No assist needed.  Ambulation/Gait Ambulation/Gait  assistance: Min guard Ambulation Distance (Feet): 150 Feet Assistive device:  (IV pole) Gait Pattern/deviations: Step-through pattern;Decreased stride length Gait velocity: decreased Gait velocity interpretation: Below normal speed for age/gender General Gait Details: Slow, steady gait pushing IV pole. HR up to 176 bpm during mobility. 1 standing rest break.    Stairs            Wheelchair Mobility    Modified Rankin (Stroke Patients Only)       Balance Overall balance assessment: Needs assistance Sitting-balance support: Feet supported;No upper extremity supported Sitting balance-Leahy Scale: Good Sitting balance - Comments: Able to donn underwear without assist reaching outside BoS.   Standing balance support: During functional activity Standing balance-Leahy Scale: Fair Standing balance comment: Able to pull up underwear in standing without assist or UE support.                    Cognition Arousal/Alertness: Awake/alert Behavior During Therapy: WFL for tasks assessed/performed Overall Cognitive Status: Within Functional Limits for tasks assessed                      Exercises      General Comments        Pertinent Vitals/Pain Pain Assessment: No/denies pain    Home Living                      Prior Function            PT Goals (current goals can now be found in the care plan section) Progress towards PT goals: Progressing toward goals    Frequency  Min 3X/week      PT Plan Current plan remains appropriate    Co-evaluation             End of Session Equipment Utilized During Treatment: Other (comment) (abdominal binder) Activity Tolerance: Treatment limited secondary to medical complications (Comment) (tachycardia) Patient left: with call bell/phone within reach;in bed (sitting EOB)     Time: [id number] PT Time Calculation (min) (ACUTE ONLY): 19 min  Charges:  $Gait Training: 8-22 mins                     G Codes:      Stephenson Cichy A Nerida Boivin 12/14/2015, 3:34 PM Wray Kearns, [place], DPT [phone]

## 2015-12-14 NOTE — Progress Notes (Addendum)
7 Days Post-Op  Subjective: PASSING GAS AND having BM's.  Ambulating with reluctance.  PT says no home PT recommended.    Objective: Vital signs in last 24 hours: Temp:  [97.9 F (36.6 C)-98.4 F (36.9 C)] 98.3 F (36.8 C) (11/27 1149) Pulse Rate:  [76-122] 76 (11/27 1149) Resp:  [15-22] 17 (11/27 1149) BP: (117-142)/(63-93) 117/75 (11/27 1149) SpO2:  [92 %-98 %] 97 % (11/27 1149) Last BM Date: 12/13/15  Intake/Output from previous day: 11/26 0701 - 11/27 0700 In: 2194.2 [P.O.:360; I.V.:1834.2] Out: 1025 [Urine:1025] Intake/Output this shift: Total I/O In: 300 [P.O.:300] Out: 450 [Urine:450]  Resp: clear to auscultation bilaterally Cardio: a fib GI: incision CDI soft BS present   Drain clear  Lab Results:   Recent Labs  12/12/15 0500 12/13/15 0052  WBC 12.0* 11.1*  HGB 8.7* 8.7*  HCT 27.4* 26.4*  PLT 343 292   BMET  Recent Labs  12/13/15 0052 12/14/15 0703  NA 137 138  K 3.5 3.6  CL 100* 99*  CO2 28 29  GLUCOSE 133* 123*  BUN 7 5*  CREATININE 0.90 0.97  CALCIUM 8.6* 8.9   PT/INR No results for input(s): LABPROT, INR in the last 72 hours. ABG No results for input(s): PHART, HCO3 in the last 72 hours.  Invalid input(s): PCO2, PO2  Studies/Results: No results found.  Anti-infectives: Anti-infectives    Start     Dose/Rate Route Frequency Ordered Stop   12/07/15 2200  piperacillin-tazobactam (ZOSYN) IVPB 3.375 g     3.375 g 12.5 mL/hr over 240 Minutes Intravenous Every 8 hours 12/07/15 1733 12/10/15 0154   12/07/15 1000  polymyxin B 500,000 Units, bacitracin 50,000 Units in sodium chloride irrigation 0.9 % 500 mL irrigation  Status:  Discontinued       As needed 12/07/15 1001 12/07/15 1246   12/07/15 0728  cefoTEtan in Dextrose 5% (CEFOTAN) 2-2.08 GM-% IVPB    Comments:  [name], [name]   : cabinet override      12/07/15 0728 12/07/15 1944   12/07/15 0600  cefoTEtan (CEFOTAN) 2 g in dextrose 5 % 50 mL IVPB     2 g 100 mL/hr over 30 Minutes  Intravenous On call to O.R. 12/06/15 1404 12/07/15 0920      Assessment/Plan: s/p Procedure(s): OPEN REVERSAL OF COLOSTOMY WITH PARTIAL SIGMOID COLECTOMY (N/A) INCISIONAL HERNIA REPAIR (N/A) Cont home meds  D/c PCA. PO pain meds Transfer to the floor today  Cont xarelto since HGB stable    Anticipate d/c tom Will need staple removal later this week. Drain removed   LOS: 7 days    [place], Sha Amer C. [phone]

## 2015-12-14 NOTE — Care Management Note (Signed)
Case Management Note  Patient Details  Name: William Fischer MRN: [id number] Date of Birth: April 06, 1965  Subjective/Objective:    S/p colostomy reversal and incisional hernia repair pod 7 , has a picc, has pca ,has drain , has chronic c diff since October, active with [facility] for iv abx, Carolynn Sayers following along.  William Fischer with [facility] notified patient is here at hospital.  NCM will cont to follow for dc needs.                                 Action/Plan:   Expected Discharge Date:                  Expected Discharge Plan:  [place]  In-House Referral:     Discharge planning Services  CM Consult  Post Acute Care Choice:  Home Health Choice offered to:     DME Arranged:    DME Agency:     HH Arranged:  RN, IV Antibiotics HH Agency:  [facility]  Status of Service:  In process, will continue to follow  If discussed at Long Length of Stay Meetings, dates discussed:    Additional Comments:  Zenon Mayo, RN 12/14/2015, 11:53 AM

## 2015-12-14 NOTE — Progress Notes (Signed)
Pt refused to get oob to chair states" Dr didn't tell me I have too" explained to pt there was an order and the rationale for getting up out of bed pt still refused.

## 2015-12-14 NOTE — Progress Notes (Signed)
Patient transferred to 6N with Rn by bed.

## 2015-12-15 LAB — BASIC METABOLIC PANEL
Anion gap: 9 (ref 5–15)
BUN: 5 mg/dL — AB (ref 6–20)
CHLORIDE: 104 mmol/L (ref 101–111)
CO2: 27 mmol/L (ref 22–32)
CREATININE: 1.13 mg/dL (ref 0.61–1.24)
Calcium: 9 mg/dL (ref 8.9–10.3)
Glucose, Bld: 108 mg/dL — ABNORMAL HIGH (ref 65–99)
POTASSIUM: 3.4 mmol/L — AB (ref 3.5–5.1)
SODIUM: 140 mmol/L (ref 135–145)

## 2015-12-15 LAB — GLUCOSE, CAPILLARY: GLUCOSE-CAPILLARY: 136 mg/dL — AB (ref 65–99)

## 2015-12-15 MED ORDER — OXYCODONE HCL 10 MG PO TABS: 5.0000 mg | ORAL_TABLET | ORAL | 0 refills | Status: DC | PRN

## 2015-12-15 NOTE — Discharge Summary (Signed)
Physician Discharge Summary  Patient ID: William Fischer MRN: [id number] DOB/AGE: [birth year] 48 y.o.  Admit date: 12/07/2015 Discharge date: 12/15/2015  Admission Diagnoses: ventral hernia, colostomy in place  Discharge Diagnoses:  Principal Problem:   Colostomy in place [facility]) Active Problems:   Morbid obesity ([place])   Chronic systolic heart failure (H[facility])   Prediabetes   Chronic atrial fibrillation ([place])   Discharged Condition: stable  Hospital Course: Pt admitted after surgery.  He was recovered in the SDU to monitor his HR and adjust his diltiazem dosing.  He was kept on anticoagulation as much as possible after surgery due to his history.  By POD 4 he began to have bowel function.  His diet was advanced.  By POD 8 he was tolerating a diet and PO pain meds.  He was ambulating without assistance.  It was felt that he was in stable condition for discharge to home.    Consults: None  Significant Diagnostic Studies: labs: cbc, chemistry  Treatments: antibiotics: Zosyn Surgery IV fluids Pain control  Discharge Exam: Blood pressure (!) 144/90, pulse 88, temperature 98.4 F (36.9 C), temperature source Oral, resp. rate 17, height 5\' 11"  (1.803 m), weight 121.5 kg (267 lb 13.7 oz), SpO2 94 %. General appearance: alert and cooperative GI: soft, non-distended Incision/Wound: clean, dry, no signs of infection  Disposition: 01-Home or Self Care     Medication List    STOP taking these medications   piperacillin-tazobactam 3.375 GM/50ML IVPB Commonly known as:  ZOSYN   vancomycin 50 mg/mL oral solution Commonly known as:  VANCOCIN     TAKE these medications   digoxin 0.125 MG tablet Commonly known as:  LANOXIN Take 1 tablet (0.125 mg total) by mouth daily.   diltiazem 180 MG 24 hr capsule Commonly known as:  CARDIZEM CD Take 1 capsule (180 mg total) by mouth 2 (two) times daily.   furosemide 40 MG tablet Commonly known as:  LASIX Take 40 mg by mouth daily. Pt.  Reports that he takes as needed   lisinopril 5 MG tablet Commonly known as:  PRINIVIL,ZESTRIL Take 5 mg by mouth daily.   metoprolol 100 MG tablet Commonly known as:  LOPRESSOR TAKE 1 TABLET (100 MG TOTAL) BY MOUTH 2 (TWO) TIMES DAILY.   multivitamin with minerals Tabs tablet Take 1 tablet by mouth daily.   Oxycodone HCl 10 MG Tabs Take 0.5-1 tablets (5-10 mg total) by mouth every 4 (four) hours as needed.   rivaroxaban 20 MG Tabs tablet Commonly known as:  XARELTO Take 1 tablet (20 mg total) by mouth daily with supper.      Follow-up Information    Rosario Adie., MD. Schedule an appointment as soon as possible for a visit in 2 week(s).   Specialty:  General Surgery Contact information: [place] [address] [phone]           Signed: Rosario Adie 0000000, 8:58 AM

## 2015-12-15 NOTE — Progress Notes (Signed)
Pt discharged to home.  Discharge instructions explained to pt.  Pt has no questions at the time of discharge.  Pt states he has all belongings.  IV removed.  Pt transported off unit via wheelchair by volunteer services.

## 2015-12-16 ENCOUNTER — Inpatient Hospital Stay: Payer: Self-pay | Admitting: Internal Medicine

## 2015-12-31 ENCOUNTER — Encounter: Payer: Self-pay | Admitting: Internal Medicine

## 2015-12-31 ENCOUNTER — Ambulatory Visit (INDEPENDENT_AMBULATORY_CARE_PROVIDER_SITE_OTHER): Payer: BLUE CROSS/BLUE SHIELD | Admitting: Internal Medicine

## 2015-12-31 VITALS — BP 102/66 | HR 72 | Ht 70.5 in | Wt 253.4 lb

## 2015-12-31 DIAGNOSIS — I481 Persistent atrial fibrillation: Secondary | ICD-10-CM

## 2015-12-31 DIAGNOSIS — I5022 Chronic systolic (congestive) heart failure: Secondary | ICD-10-CM | POA: Diagnosis not present

## 2015-12-31 DIAGNOSIS — I420 Dilated cardiomyopathy: Secondary | ICD-10-CM | POA: Diagnosis not present

## 2015-12-31 DIAGNOSIS — I4819 Other persistent atrial fibrillation: Secondary | ICD-10-CM

## 2015-12-31 NOTE — Progress Notes (Signed)
Electrophysiology Office Note   Date:  12/31/2015   ID:  William Fischer, DOB May 07, 1965, MRN [id number]  PCP:  Julious Oka, MD  Cardiologist:  none Primary Electrophysiologist: Thompson Grayer, MD    Chief Complaint  Patient presents with  . Atrial Fibrillation     History of Present Illness: William Fischer is a 50 y.o. male who presents today for electrophysiology evaluation.    He is very happy s/p colostomy take down and hernia repair.  He has lost weight.  He feels good. Today, he denies symptoms of palpitations, chest pain, shortness of breath, orthopnea, PND, lower extremity edema, claudication, dizziness, presyncope, syncope, bleeding, or neurologic sequela. The patient is tolerating medications without difficulties and is otherwise without complaint today.    Past Medical History:  Diagnosis Date  . Allergy to IVP dye   . Atrial fibrillation ([place])    admx 11/13 with acute sCHF in setting of RVR  => a. failed DCCV x 2; b. Pradaxa started;  c. failed sotalol  . Cardiomyopathy ([place])    likely tachy mediated in setting of AF with RVR  . Chicken pox as a child  . Chronic anticoagulation    Pradaxa  . Chronic systolic heart failure ([place])    a. echo 11/13: Ef 40-45%, diff HK, mod MR, mod LAE, mild RVE, mod RAE, small effusion;   b. TEE 11/13:  EF 35-40%, no LAA clot; Echo 2/14 shows normal EF  . Depression with anxiety 06/29/2011  . Dysrhythmia   . Hypertension   . 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 Migraine 06/29/2011  . Obesity 06/29/2011  . Snoring    patient needs sleep study - has declined   Past Surgical History:  Procedure Laterality Date  . CARDIOVERSION  11/30/2011   Procedure: CARDIOVERSION;  Surgeon: Thayer Headings, MD;  Location: [place];  Service: Cardiovascular;  Laterality: N/A;  . CARDIOVERSION  12/03/2011   Procedure: CARDIOVERSION;  Surgeon: Jolaine Artist, MD;  Location: [place];  Service: Cardiovascular;  Laterality: N/A;  . COLECTOMY   12/07/2015   partial  . COLON SURGERY    . COLOSTOMY REVERSAL  12/07/2015  . COLOSTOMY REVERSAL N/A 12/07/2015   Procedure: OPEN REVERSAL OF COLOSTOMY WITH PARTIAL SIGMOID COLECTOMY;  Surgeon: Leighton Ruff, MD;  Location: [place];  Service: General;  Laterality: N/A;  . HERNIA REPAIR    . INCISIONAL HERNIA REPAIR  12/07/2015  . INCISIONAL HERNIA REPAIR N/A 12/07/2015   Procedure: INCISIONAL HERNIA REPAIR;  Surgeon: Leighton Ruff, MD;  Location: [facility];  Service: General;  Laterality: N/A;  . IR GENERIC HISTORICAL  08/11/2015   IR RADIOLOGIST EVAL & MGMT 08/11/2015 Markus Daft, MD [facility] INTERV RAD  . IR GENERIC HISTORICAL  11/18/2015   IR CATHETER TUBE CHANGE 11/18/2015 Corrie Mckusick, DO [facility]INTER[facility] RAD  . LAPAROTOMY N/A 01/17/2015   Procedure: EXPLORATORY LAPAROTOMY WITH LEFT COLECTOMY AND COLOSTOMY;  Surgeon: Rolm Bookbinder, MD;  Location: [facility];  Service: General;  Laterality: N/A;  . [place]  11/2015  . TEE WITHOUT CARDIOVERSION  11/30/2011   Procedure: TRANSESOPHAGEAL ECHOCARDIOGRAM (TEE);  Surgeon: Thayer Headings, MD;  Location: [place];  Service: Cardiovascular;  Laterality: N/A;  . TONSILLECTOMY       Current Outpatient Prescriptions  Medication Sig Dispense Refill  . acetaminophen (TYLENOL) 500 MG tablet Take 1,000 mg by mouth every 6 (six) hours as needed (pain).    [name] digoxin (LANOXIN) 0.125 MG tablet Take 1  tablet (0.125 mg total) by mouth daily. 34 tablet 0  . diltiazem (CARDIZEM CD) 180 MG 24 hr capsule Take 1 capsule (180 mg total) by mouth 2 (two) times daily. 60 capsule 10  . furosemide (LASIX) 40 MG tablet Take 40 mg by mouth daily. Pt. Reports that he takes as needed  10  . lisinopril (PRINIVIL,ZESTRIL) 5 MG tablet Take 5 mg by mouth daily.  10  . metoprolol (LOPRESSOR) 100 MG tablet TAKE 1 TABLET (100 MG TOTAL) BY MOUTH 2 (TWO) TIMES DAILY. 60 tablet 11  . Multiple Vitamin (MULTIVITAMIN WITH MINERALS) TABS tablet Take 1 tablet  by mouth daily.    . rivaroxaban (XARELTO) 20 MG TABS tablet Take 1 tablet (20 mg total) by mouth daily with supper. 30 tablet 11   No current facility-administered medications for this visit.     Allergies:   Contrast media [iodinated diagnostic agents]   Social History:  The patient  reports that he quit smoking about 17 months ago. His smoking use included Cigarettes. He has a 20.00 pack-year smoking history. He has never used smokeless tobacco. He reports that he does not drink alcohol or use drugs.   Family History:  The patient's  family history includes Alcohol abuse in his father and paternal grandfather; Aneurysm in his father; Cancer in his maternal grandmother and paternal grandmother; Diabetes in his maternal grandfather and paternal grandmother; Hypertension in his father and mother; Parkinsonism in his maternal grandfather.    ROS:  Please see the history of present illness.   All other systems are reviewed and negative.    PHYSICAL EXAM: VS:  BP 102/66   Pulse 72   Ht 5' 10.5" (1.791 m)   Wt 253 lb 6.4 oz (114.9 kg)   BMI 35.85 kg/m  , BMI Body mass index is 35.85 kg/m. GEN: Well nourished, well developed, in no acute distress  HEENT: normal  Neck: no JVD, carotid bruits, or masses Cardiac: irregular rhythm; no murmurs, rubs, or gallops,no edema  Respiratory:  clear to auscultation bilaterally, normal work of breathing GI: soft, healing midline wound, ostomy in place, drainage noted from a L lateral site MS: no deformity or atrophy  Skin: warm and dry  Neuro:  Strength and sensation are intact Psych: euthymic mood, full affect  EKG:  EKG is ordered today. The ekg ordered today shows afib, V rate 72 bpm, nonspecific ST/T changes   Recent Labs: 01/26/2015: B Natriuretic Peptide 301.9 04/18/2015: Magnesium 2.0 11/18/2015: ALT 33 12/13/2015: Hemoglobin 8.7; Platelets 292 12/15/2015: BUN 5; Creatinine, Ser 1.13; Potassium 3.4; Sodium 140    Lipid Panel       Component Value Date/Time   CHOL 149 07/16/2013 1412   TRIG 156 (H) 01/17/2015 1923   HDL 46.20 07/16/2013 1412   CHOLHDL 3 07/16/2013 1412   VLDL 16.0 07/16/2013 1412   LDLCALC 87 07/16/2013 1412     Wt Readings from Last 3 Encounters:  12/31/15 253 lb 6.4 oz (114.9 kg)  12/12/15 267 lb 13.7 oz (121.5 kg)  12/02/15 262 lb 6.4 oz (119 kg)    ASSESSMENT AND PLAN:  1.  Longstanding persistent afib Doing very well No changes today The importance of compliance with anticoagulation given prior mesenteric infarct discussed with the patient  2. Nonischemic CM His EF recovered with rate control previously.  I am not reluctant to use/ titrate diltiazem in this patient.  Repeat echo upon follow-up with Renee  3. Obesity Weight loss advised He has declined sleep  study  F/u with EP PA-C in 3 months.  Needs echo at that time  I will see again in 6 months   Current medicines are reviewed at length with the patient today.   The patient does not have concerns regarding his medicines.  The following changes were made today:  none  Signed, Thompson Grayer, MD  12/31/2015 10:31 AM     [facility] [address] [place] [address] [phone] (office) [phone] (fax)

## 2015-12-31 NOTE — Patient Instructions (Signed)
Medication Instructions:  Your physician recommends that you continue on your current medications as directed. Please refer to the Current Medication list given to you today.   Labwork: None ordered   Testing/Procedures: Your physician has requested that you have an echocardiogram. Echocardiography is a painless test that uses sound waves to create images of your heart. It provides your doctor with information about the size and shape of your heart and how well your heart's chambers and valves are working. This procedure takes approximately one hour. There are no restrictions for this procedure---in 3 months  Follow-Up: Your physician recommends that you schedule a follow-up appointment in: 3 months with Tommye Standard, PA and 6 months with Dr Rayann Heman  Weigh yourself daily   Low-Sodium Eating Plan Sodium raises blood pressure and causes water to be held in the body. Getting less sodium from food will help lower your blood pressure, reduce any swelling, and protect your heart, liver, and kidneys. We get sodium by adding salt (sodium chloride) to food. Most of our sodium comes from canned, boxed, and frozen foods. Restaurant foods, fast foods, and pizza are also very high in sodium. Even if you take medicine to lower your blood pressure or to reduce fluid in your body, getting less sodium from your food is important. What is my plan? Most people should limit their sodium intake to 2,300 mg a day. Your health care provider recommends that you limit your sodium intake to 2 grams  a day. What do I need to know about this eating plan? For the low-sodium eating plan, you will follow these general guidelines:  Choose foods with a % Daily Value for sodium of less than 5% (as listed on the food label).  Use salt-free seasonings or herbs instead of table salt or sea salt.  Check with your health care provider or pharmacist before using salt substitutes.  Eat fresh foods.  Eat more vegetables and  fruits.  Limit canned vegetables. If you do use them, rinse them well to decrease the sodium.  Limit cheese to 1 oz (28 g) per day.  Eat lower-sodium products, often labeled as "lower sodium" or "no salt added."  Avoid foods that contain monosodium glutamate (MSG). MSG is sometimes added to [place] food and some canned foods.  Check food labels (Nutrition Facts labels) on foods to learn how much sodium is in one serving.  Eat more home-cooked food and less restaurant, buffet, and fast food.  When eating at a restaurant, ask that your food be prepared with less salt, or no salt if possible. How do I read food labels for sodium information? The Nutrition Facts label lists the amount of sodium in one serving of the food. If you eat more than one serving, you must multiply the listed amount of sodium by the number of servings. Food labels may also identify foods as:  Sodium free-Less than 5 mg in a serving.  Very low sodium-35 mg or less in a serving.  Low sodium-140 mg or less in a serving.  Light in sodium-50% less sodium in a serving. For example, if a food that usually has 300 mg of sodium is changed to become light in sodium, it will have 150 mg of sodium.  Reduced sodium-25% less sodium in a serving. For example, if a food that usually has 400 mg of sodium is changed to reduced sodium, it will have 300 mg of sodium. What foods can I eat? Grains  Low-sodium cereals, including oats, puffed wheat  and rice, and shredded wheat cereals. Low-sodium crackers. Unsalted rice and pasta. Lower-sodium bread. Vegetables  Frozen or fresh vegetables. Low-sodium or reduced-sodium canned vegetables. Low-sodium or reduced-sodium tomato sauce and paste. Low-sodium or reduced-sodium tomato and vegetable juices. Fruits  Fresh, frozen, and canned fruit. Fruit juice. Meat and Other Protein Products  Low-sodium canned tuna and salmon. Fresh or frozen meat, poultry, seafood, and fish. Lamb. Unsalted  nuts. Dried beans, peas, and lentils without added salt. Unsalted canned beans. Homemade soups without salt. Eggs. Dairy  Milk. Soy milk. Ricotta cheese. Low-sodium or reduced-sodium cheeses. Yogurt. Condiments  Fresh and dried herbs and spices. Salt-free seasonings. Onion and garlic powders. Low-sodium varieties of mustard and ketchup. Fresh or refrigerated horseradish. Lemon juice. Fats and Oils  Reduced-sodium salad dressings. Unsalted butter. Other  Unsalted popcorn and pretzels. The items listed above may not be a complete list of recommended foods or beverages. Contact your dietitian for more options.  What foods are not recommended? Grains  Instant hot cereals. Bread stuffing, pancake, and biscuit mixes. Croutons. Seasoned rice or pasta mixes. Noodle soup cups. Boxed or frozen macaroni and cheese. Self-rising flour. Regular salted crackers. Vegetables  Regular canned vegetables. Regular canned tomato sauce and paste. Regular tomato and vegetable juices. Frozen vegetables in sauces. Salted [place] fries. Olives. Angie Fava. Relishes. Sauerkraut. Salsa. Meat and Other Protein Products  Salted, canned, smoked, spiced, or pickled meats, seafood, or fish. Bacon, ham, sausage, hot dogs, corned beef, chipped beef, and packaged luncheon meats. Salt pork. Jerky. Pickled herring. Anchovies, regular canned tuna, and sardines. Salted nuts. Dairy  Processed cheese and cheese spreads. Cheese curds. Blue cheese and cottage cheese. Buttermilk. Condiments  Onion and garlic salt, seasoned salt, table salt, and sea salt. Canned and packaged gravies. Worcestershire sauce. Tartar sauce. Barbecue sauce. Teriyaki sauce. Soy sauce, including reduced sodium. Steak sauce. Fish sauce. Oyster sauce. Cocktail sauce. Horseradish that you find on the shelf. Regular ketchup and mustard. Meat flavorings and tenderizers. Bouillon cubes. Hot sauce. Tabasco sauce. Marinades. Taco seasonings. Relishes. Fats and Oils  Regular  salad dressings. Salted butter. Margarine. Ghee. Bacon fat. Other  Potato and tortilla chips. Corn chips and puffs. Salted popcorn and pretzels. Canned or dried soups. Pizza. Frozen entrees and pot pies. The items listed above may not be a complete list of foods and beverages to avoid. Contact your dietitian for more information.  This information is not intended to replace advice given to you by your health care provider. Make sure you discuss any questions you have with your health care provider. Document Released: 06/25/2001 Document Revised: 06/11/2015 Document Reviewed: 11/07/2012 Elsevier Interactive Patient Education  2017 [employer].         If you need a refill on your cardiac medications before your next appointment, please call your pharmacy.

## 2016-01-05 ENCOUNTER — Other Ambulatory Visit: Payer: Self-pay | Admitting: General Surgery

## 2016-01-05 DIAGNOSIS — T814XXA Infection following a procedure, initial encounter: Principal | ICD-10-CM

## 2016-01-05 DIAGNOSIS — K651 Peritoneal abscess: Principal | ICD-10-CM

## 2016-01-05 DIAGNOSIS — IMO0001 Reserved for inherently not codable concepts without codable children: Secondary | ICD-10-CM

## 2016-01-05 DIAGNOSIS — Z9889 Other specified postprocedural states: Secondary | ICD-10-CM

## 2016-01-06 ENCOUNTER — Inpatient Hospital Stay: Admission: RE | Admit: 2016-01-06 | Payer: Self-pay | Source: Ambulatory Visit

## 2016-01-08 ENCOUNTER — Inpatient Hospital Stay (HOSPITAL_COMMUNITY)
Admission: EM | Admit: 2016-01-08 | Discharge: 2016-01-13 | DRG: 372 | Disposition: A | Discharge status: Home / Self Care | Payer: BLUE CROSS/BLUE SHIELD | Attending: Emergency Medicine | Admitting: Emergency Medicine

## 2016-01-08 ENCOUNTER — Ambulatory Visit
Admission: RE | Admit: 2016-01-08 | Discharge: 2016-01-08 | Disposition: A | Discharge status: Home / Self Care | Payer: BLUE CROSS/BLUE SHIELD | Source: Ambulatory Visit | Attending: General Surgery | Admitting: General Surgery

## 2016-01-08 ENCOUNTER — Encounter (HOSPITAL_COMMUNITY): Payer: Self-pay | Admitting: Emergency Medicine

## 2016-01-08 DIAGNOSIS — F418 Other specified anxiety disorders: Secondary | ICD-10-CM | POA: Diagnosis present

## 2016-01-08 DIAGNOSIS — Z8619 Personal history of other infectious and parasitic diseases: Secondary | ICD-10-CM

## 2016-01-08 DIAGNOSIS — Z6836 Body mass index (BMI) 36.0-36.9, adult: Secondary | ICD-10-CM

## 2016-01-08 DIAGNOSIS — R109 Unspecified abdominal pain: Secondary | ICD-10-CM | POA: Diagnosis not present

## 2016-01-08 DIAGNOSIS — Z809 Family history of malignant neoplasm, unspecified: Secondary | ICD-10-CM

## 2016-01-08 DIAGNOSIS — I110 Hypertensive heart disease with heart failure: Secondary | ICD-10-CM | POA: Diagnosis present

## 2016-01-08 DIAGNOSIS — K5720 Diverticulitis of large intestine with perforation and abscess without bleeding: Secondary | ICD-10-CM | POA: Diagnosis not present

## 2016-01-08 DIAGNOSIS — I429 Cardiomyopathy, unspecified: Secondary | ICD-10-CM | POA: Diagnosis not present

## 2016-01-08 DIAGNOSIS — N179 Acute kidney failure, unspecified: Secondary | ICD-10-CM | POA: Diagnosis not present

## 2016-01-08 DIAGNOSIS — T814XXA Infection following a procedure, initial encounter: Secondary | ICD-10-CM | POA: Diagnosis not present

## 2016-01-08 DIAGNOSIS — Z833 Family history of diabetes mellitus: Secondary | ICD-10-CM

## 2016-01-08 DIAGNOSIS — Z811 Family history of alcohol abuse and dependence: Secondary | ICD-10-CM

## 2016-01-08 DIAGNOSIS — Z87891 Personal history of nicotine dependence: Secondary | ICD-10-CM

## 2016-01-08 DIAGNOSIS — I482 Chronic atrial fibrillation, unspecified: Secondary | ICD-10-CM | POA: Diagnosis present

## 2016-01-08 DIAGNOSIS — L02211 Cutaneous abscess of abdominal wall: Secondary | ICD-10-CM | POA: Diagnosis not present

## 2016-01-08 DIAGNOSIS — K651 Peritoneal abscess: Principal | ICD-10-CM

## 2016-01-08 DIAGNOSIS — I428 Other cardiomyopathies: Secondary | ICD-10-CM

## 2016-01-08 DIAGNOSIS — Z7901 Long term (current) use of anticoagulants: Secondary | ICD-10-CM | POA: Diagnosis not present

## 2016-01-08 DIAGNOSIS — D179 Benign lipomatous neoplasm, unspecified: Secondary | ICD-10-CM | POA: Diagnosis present

## 2016-01-08 DIAGNOSIS — I5022 Chronic systolic (congestive) heart failure: Secondary | ICD-10-CM | POA: Diagnosis present

## 2016-01-08 DIAGNOSIS — Z820 Family history of epilepsy and other diseases of the nervous system: Secondary | ICD-10-CM | POA: Diagnosis not present

## 2016-01-08 DIAGNOSIS — Z91041 Radiographic dye allergy status: Secondary | ICD-10-CM

## 2016-01-08 DIAGNOSIS — IMO0002 Reserved for concepts with insufficient information to code with codable children: Secondary | ICD-10-CM | POA: Diagnosis present

## 2016-01-08 DIAGNOSIS — IMO0001 Reserved for inherently not codable concepts without codable children: Secondary | ICD-10-CM

## 2016-01-08 DIAGNOSIS — Z9889 Other specified postprocedural states: Secondary | ICD-10-CM

## 2016-01-08 DIAGNOSIS — B999 Unspecified infectious disease: Secondary | ICD-10-CM

## 2016-01-08 DIAGNOSIS — Z8249 Family history of ischemic heart disease and other diseases of the circulatory system: Secondary | ICD-10-CM

## 2016-01-08 LAB — CBC
HEMATOCRIT: 36 % — AB (ref 39.0–52.0)
Hemoglobin: 11.7 g/dL — ABNORMAL LOW (ref 13.0–17.0)
MCH: 28.3 pg (ref 26.0–34.0)
MCHC: 32.5 g/dL (ref 30.0–36.0)
MCV: 87 fL (ref 78.0–100.0)
Platelets: 574 10*3/uL — ABNORMAL HIGH (ref 150–400)
RBC: 4.14 MIL/uL — ABNORMAL LOW (ref 4.22–5.81)
RDW: 14.9 % (ref 11.5–15.5)
WBC: 12.2 10*3/uL — AB (ref 4.0–10.5)

## 2016-01-08 LAB — COMPREHENSIVE METABOLIC PANEL
ALBUMIN: 3.2 g/dL — AB (ref 3.5–5.0)
ALT: 23 U/L (ref 17–63)
AST: 21 U/L (ref 15–41)
Alkaline Phosphatase: 67 U/L (ref 38–126)
Anion gap: 10 (ref 5–15)
BUN: 20 mg/dL (ref 6–20)
CHLORIDE: 100 mmol/L — AB (ref 101–111)
CO2: 23 mmol/L (ref 22–32)
Calcium: 10 mg/dL (ref 8.9–10.3)
Creatinine, Ser: 1.41 mg/dL — ABNORMAL HIGH (ref 0.61–1.24)
GFR calc Af Amer: >60 mL/min (ref >60)
GFR, EST NON AFRICAN AMERICAN: 58 mL/min — AB (ref >60)
Glucose, Bld: 254 mg/dL — ABNORMAL HIGH (ref 65–99)
POTASSIUM: 4.6 mmol/L (ref 3.5–5.1)
Sodium: 133 mmol/L — ABNORMAL LOW (ref 135–145)
Total Bilirubin: 0.3 mg/dL (ref 0.3–1.2)
Total Protein: 8.1 g/dL (ref 6.5–8.1)

## 2016-01-08 LAB — I-STAT CG4 LACTIC ACID, ED: LACTIC ACID, VENOUS: 2.93 mmol/L — AB (ref 0.5–1.9)

## 2016-01-08 MED ORDER — PIPERACILLIN-TAZOBACTAM 3.375 G IVPB
3.3750 g | Freq: Once | INTRAVENOUS | Status: AC
  Administered 2016-01-08: 3.375 g via INTRAVENOUS
  Filled 2016-01-08: qty 50

## 2016-01-08 MED ORDER — SODIUM CHLORIDE 0.9 % IV SOLN
Freq: Once | INTRAVENOUS | Status: AC
  Administered 2016-01-08: via INTRAVENOUS

## 2016-01-08 MED ORDER — VANCOMYCIN HCL IN DEXTROSE 1-5 GM/200ML-% IV SOLN: 1000.0000 mg | Freq: Once | INTRAVENOUS | Status: DC

## 2016-01-08 MED ORDER — IOPAMIDOL (ISOVUE-300) INJECTION 61%
100.0000 mL | Freq: Once | INTRAVENOUS | Status: AC | PRN
  Administered 2016-01-08: 100 mL via INTRAVENOUS

## 2016-01-08 MED ORDER — MORPHINE SULFATE (PF) 4 MG/ML IV SOLN
4.0000 mg | Freq: Once | INTRAVENOUS | Status: AC
  Administered 2016-01-08: 4 mg via INTRAVENOUS
  Filled 2016-01-08: qty 1

## 2016-01-08 MED ORDER — SODIUM CHLORIDE 0.9 % IV BOLUS (SEPSIS)
1000.0000 mL | Freq: Once | INTRAVENOUS | Status: AC
  Administered 2016-01-08: 1000 mL via INTRAVENOUS

## 2016-01-08 MED ORDER — VANCOMYCIN HCL 10 G IV SOLR
2000.0000 mg | Freq: Once | INTRAVENOUS | Status: AC
  Administered 2016-01-09: 2000 mg via INTRAVENOUS
  Filled 2016-01-08 (×3): qty 2000

## 2016-01-08 NOTE — H&P (Signed)
William Fischer is an 50 y.o. male.   Chief Complaint: "pus from abdominal wound" HPI:  Pt is a 50 yo M who is s/p colostomy takedown and repair of incisional hernia 12/07/2015.  Original pathology was ischemic colon from atrial fibrillation.  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  The takedown was performed by Dr. Marcello Moores and the abdominal wall closure by Dr.Wakefield.  Hernia repair consisted of bilateral rectus sheath release with advancement flaps and onlay phasix mesh.  He was discharged 11/28.  He was maintained on anticoagulation while in the hospital and had a bit of an ileus post operatively.   He saw cardiology last week and was doing well.  He saw Drs. Donne Hazel and Logan last week in the clinic.  He had some purulent fluid draining from his colostomy wound but no fevers.  Upon follow up this week on 12/19 (three days prior to admission) he was seen again by Dr. Donne Hazel and scan was ordered because drainage was copious and continued.  This occurred this afternoon and he was seen to have a large abdominal abscess.  One is relatively superficial under the fascia, but the other one is deeper.  ? Communication between the abscesses.  He reports chills at this point, but has not had fevers.  He continues to have no n/v.  He complains of mild mid/lower right abdominal pain.    Past Medical History:  Diagnosis Date  . Allergy to IVP dye   . Atrial fibrillation ([place])    admx 11/13 with acute sCHF in setting of RVR  => a. failed DCCV x 2; b. Pradaxa started;  c. failed sotalol  . 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 Chicken pox as a child  . Chronic anticoagulation    Pradaxa  . Chronic systolic heart failure ([place])    a. echo 11/13: Ef 40-45%, diff HK, mod MR, mod LAE, mild RVE, mod RAE, small effusion;   b. TEE 11/13:  EF 35-40%, no LAA clot; Echo 2/14 shows normal EF  . Depression with anxiety 06/29/2011    . Dysrhythmia   . Hypertension   . 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 Migraine 06/29/2011  . Obesity 06/29/2011  . Snoring    patient needs sleep study - has declined    Past Surgical History:  Procedure Laterality Date  . CARDIOVERSION  11/30/2011   Procedure: CARDIOVERSION;  Surgeon: Thayer Headings, MD;  Location: [place];  Service: Cardiovascular;  Laterality: N/A;  . CARDIOVERSION  12/03/2011   Procedure: CARDIOVERSION;  Surgeon: Jolaine Artist, MD;  Location: [place];  Service: Cardiovascular;  Laterality: N/A;  . COLECTOMY  12/07/2015   partial  . COLON SURGERY    . COLOSTOMY REVERSAL  12/07/2015  . COLOSTOMY REVERSAL N/A 12/07/2015   Procedure: OPEN REVERSAL OF COLOSTOMY WITH PARTIAL SIGMOID COLECTOMY;  Surgeon: Leighton Ruff, MD;  Location: [place];  Service: General;  Laterality: N/A;  . HERNIA REPAIR    . INCISIONAL HERNIA REPAIR  12/07/2015  . INCISIONAL HERNIA REPAIR N/A 12/07/2015   Procedure: INCISIONAL HERNIA REPAIR;  Surgeon: Leighton Ruff, MD;  Location: [place];  Service: General;  Laterality: N/A;  . IR GENERIC HISTORICAL  08/11/2015   IR RADIOLOGIST EVAL & MGMT 08/11/2015 Markus Daft, MD [facility] INTERV RAD  . IR GENERIC HISTORICAL  11/18/2015   IR CATHETER TUBE CHANGE 11/18/2015 Corrie Mckusick,  DO [facility]INTER[facility] RAD  . LAPAROTOMY N/A 01/17/2015   Procedure: EXPLORATORY LAPAROTOMY WITH LEFT COLECTOMY AND COLOSTOMY;  Surgeon: Rolm Bookbinder, MD;  Location: [place];  Service: General;  Laterality: N/A;  . [place]  11/2015  . TEE WITHOUT CARDIOVERSION  11/30/2011   Procedure: TRANSESOPHAGEAL ECHOCARDIOGRAM (TEE);  Surgeon: Thayer Headings, MD;  Location[facility] ENDOSCOPY;  Service: Cardiovascular;  Laterality: N/A;  . TONSILLECTOMY      Family History  Problem Relation Age of Onset  . Hypertension Mother   . Hypertension Father   . Aneurysm Father   . Alcohol abuse Father   . Cancer Maternal Grandmother     brain  . Diabetes  Maternal Grandfather     type 2  . Parkinsonism Maternal Grandfather   . Cancer Paternal Grandmother     breast  . Diabetes Paternal Grandmother   . Alcohol abuse Paternal Grandfather    Social History:  reports that he quit smoking about 17 months ago. His smoking use included Cigarettes. He has a 20.00 pack-year smoking history. He has never used smokeless tobacco. He reports that he does not drink alcohol or use drugs.  Allergies:  Allergies  Allergen Reactions  . Contrast Media [Iodinated Diagnostic Agents] Rash    a diffuse macular rash after CTA chest  Pt was premedicated with '125mg'$  IV Solumedrol, '50mg'$  IV Benadryl 1 hr prior to CTexam, and tolerated procedure without any difficulties. 11/28/11 Also same pre med protocol observed on 02/22/15 and pt tolerated the procedure well   Medications: acetaminophen (TYLENOL) 500 MG tablet     digoxin (LANOXIN) 0.125 MG tablet    diltiazem (CARDIZEM CD) 180 MG 24 hr capsule    furosemide (LASIX) 40 MG tablet    lisinopril (PRINIVIL,ZESTRIL) 5 MG tablet    metoprolol (LOPRESSOR) 100 MG tablet    Multiple Vitamin (MULTIVITAMIN WITH MINERALS) TABS tablet    rivaroxaban (XARELTO) 20 MG TABS tablet       Results for orders placed or performed during the hospital encounter of 01/08/16 (from the past 48 hour(s))  Comprehensive metabolic panel     Status: Abnormal   Collection Time: 01/08/16  7:59 PM  Result Value Ref Range   Sodium 133 (L) 135 - 145 mmol/L   Potassium 4.6 3.5 - 5.1 mmol/L   Chloride 100 (L) 101 - 111 mmol/L   CO2 23 22 - 32 mmol/L   Glucose, Bld 254 (H) 65 - 99 mg/dL   BUN 20 6 - 20 mg/dL   Creatinine, Ser 1.41 (H) 0.61 - 1.24 mg/dL   Calcium 10.0 8.9 - 10.3 mg/dL   Total Protein 8.1 6.5 - 8.1 g/dL   Albumin 3.2 (L) 3.5 - 5.0 g/dL   AST 21 15 - 41 U/L   ALT 23 17 - 63 U/L   Alkaline Phosphatase 67 38 - 126 U/L   Total Bilirubin 0.3 0.3 - 1.2 mg/dL   GFR calc non Af Amer 58 (L) >60 mL/min   GFR calc Af Amer >60 >60  mL/min    Comment: (NOTE) The eGFR has been calculated using the CKD EPI equation. This calculation has not been validated in all clinical situations. eGFR's persistently <60 mL/min signify possible Chronic Kidney Disease.    Anion gap 10 5 - 15  CBC     Status: Abnormal   Collection Time: 01/08/16  7:59 PM  Result Value Ref Range   WBC 12.2 (H) 4.0 - 10.5 K/uL   RBC  4.14 (L) 4.22 - 5.81 MIL/uL   Hemoglobin 11.7 (L) 13.0 - 17.0 g/dL   HCT 36.0 (L) 39.0 - 52.0 %   MCV 87.0 78.0 - 100.0 fL   MCH 28.3 26.0 - 34.0 pg   MCHC 32.5 30.0 - 36.0 g/dL   RDW 14.9 11.5 - 15.5 %   Platelets 574 (H) 150 - 400 K/uL   Ct Abdomen Pelvis W Contrast  Result Date: 01/08/2016 CLINICAL DATA:  Status post bilateral posterior rectus sheath release with advancement flap some primary abdominal wall closure after colostomy takedown on 12/07/2015. Patient with worsening abdominal pain. EXAM: CT ABDOMEN AND PELVIS WITH CONTRAST TECHNIQUE: Multidetector CT imaging of the abdomen and pelvis was performed using the standard protocol following bolus administration of intravenous contrast. CONTRAST:  145m ISOVUE-300 IOPAMIDOL (ISOVUE-300) INJECTION 61% COMPARISON:  11/17/2015 FINDINGS: Lower chest:  Subsegmental atelectasis noted left lower lobe. Hepatobiliary: 13 mm low-density lesion posterior right liver is stable in the interval. Liver otherwise unremarkable. Gallbladder decompressed. No intrahepatic or extrahepatic biliary dilation. Pancreas: No focal mass lesion. No dilatation of the main duct. No intraparenchymal cyst. No peripancreatic edema. Spleen: No splenomegaly. No focal mass lesion. Adrenals/Urinary Tract: Adrenal glands are stable in appearance. No enhancing lesion identified in either kidney. No hydronephrosis. No evidence for hydroureter. The urinary bladder appears normal for the degree of distention. Stomach/Bowel: Stomach is nondistended. No gastric wall thickening. No evidence of outlet obstruction.  Duodenum is normally positioned as is the ligament of Treitz. No small bowel wall thickening. No small bowel dilatation. The terminal ileum is normal. The appendix is normal. Colon is foreshortened compatible with prior left colectomy and recent colostomy takedown. Vascular/Lymphatic: There is abdominal aortic atherosclerosis without aneurysm. Scattered small lymph nodes are seen in the hepatoduodenal ligament and retroperitoneal space. No overt lymphadenopathy. Insert no pelvic sidewall lymphadenopathy. Reproductive: The prostate gland and seminal vesicles have normal imaging features. Other: Irregular, multiloculated rim enhancing fluid collection is identified in the anterior abdomen. Dominant component of this collection measures approximately 17.7 by 5.6 by 9.1 cm. This collection is immediately deep to the anterior abdominal and may be extraperitoneal in position. A second smaller collection is seen in the central mesentery (image 57 series 2) measuring approximately 3.8 x 6.1 cm. Gas and fluid is seen in the subcutaneous tissues superficial to the rectus sheath with associated edema/inflammation and appears to communicate with a right paramidline wound. Musculoskeletal: Bone windows reveal no worrisome lytic or sclerotic osseous lesions. IMPRESSION: 1. Large irregular multiloculated abscess in the anterior aspect of the lower anterior abdomen, just deep to the rectus musculature. This could be in the preperitoneal space or potentially in the anterior peritoneal cavity. Imaging features are compatible with abscess. 2. A second smaller 4 x 6 cm abscess appears to be in the central mesentery. 3. Gas and fluid collections in the anterior subcutaneous fat appear to communicate with a right paramidline wound. 4. No intraperitoneal free fluid and no evidence for contrast extravasation. No features on the current study to overtly suggest anastomotic leak at the distal colonic suture line. I personally discussed the  results of this study with Dr. NLucia Gaskins covering for Dr. WDonne Hazel at approximately 1650 hours on 01/08/2016. Electronically Signed   By: EMisty StanleyM.D.   On: 01/08/2016 17:02    Review of Systems  Constitutional: Positive for chills and weight loss.  HENT: Negative.   Eyes: Negative.   Respiratory: Positive for shortness of breath.   Cardiovascular: Negative.   Gastrointestinal: Positive  for abdominal pain. Negative for nausea and vomiting.  Genitourinary: Negative.   Musculoskeletal: Positive for back pain.  Skin: Negative.   Neurological: Negative.   Endo/Heme/Allergies: Negative.   Psychiatric/Behavioral: Negative.      Blood pressure 101/58, pulse 80, temperature 98.5 F (36.9 C), temperature source Oral, resp. rate 15, height _0  (1.778 m), weight 115.7 kg (255 lb), SpO2 98 %. Physical Exam  Constitutional: He is oriented to person, place, and time. He appears well-developed and well-nourished. He appears distressed (looks uncomfortable).  HENT:  Head: Normocephalic and atraumatic.  Right Ear: External ear normal.  Left Ear: External ear normal.  Mouth/Throat: Oropharynx is clear and moist.  Eyes: Conjunctivae and EOM are normal. Pupils are equal, round, and reactive to light. Right eye exhibits no discharge. Left eye exhibits no discharge. No scleral icterus.  Neck: Neck supple. No tracheal deviation present. No thyromegaly present.  Cardiovascular: Normal rate, regular rhythm and intact distal pulses.   Respiratory: Effort normal. No respiratory distress. He exhibits no tenderness.  GI: Soft. He exhibits distension (mild). There is tenderness (mild lower abdominal tenderness.). There is no rebound and no guarding.  Redness at former colostomy site with purulent drainage    Musculoskeletal: Normal range of motion.  Neurological: He is alert and oriented to person, place, and time.  Skin: Skin is warm and dry. No rash noted. He is not diaphoretic. There is erythema  (abdominal skin near former colostomy site). No pallor.  Psychiatric: He has a normal mood and affect. His behavior is normal. Judgment and thought content normal.      Assessment/Plan Abdominal abscess (x 2) Atrial fibrillation Heart failure S/p colostomy takedown with complex hernia repair  Hold xarelto. Lovenox for anticoagulation Would like to try perc drainage first if possible. Will need to discuss with IR. Can also consider wound vac on right sided former colostomy site.   Continue home medications Broad spectrum antibiotics with zosyn and vanc.   Will likely need digoxin levels.   Will make NPO except clears after midnight in case IR can do drainage tomorrow.  May want to hold xarelto for an additional 24 hours.     Stark Klein, MD 01/08/2016, 11:38 PM

## 2016-01-08 NOTE — ED Notes (Signed)
ED Provider at bedside. 

## 2016-01-08 NOTE — ED Provider Notes (Signed)
[facility] DEPT Provider Note   CSN: [id number] Arrival date & time: 01/08/16  1925  By signing my name below, I, Jeanell Sparrow, attest that this documentation has been prepared under the direction and in the presence of Ezequiel Essex, MD . Electronically Signed: Jeanell Sparrow, Scribe. 01/08/2016. 11:03 PM.  History   Chief Complaint Chief Complaint  Patient presents with  . Abdominal Pain    The history is provided by the patient. No language interpreter was used.   HPI Comments: TASHI BRKIC is a 50 y.o. male who presents to the Emergency Department complaining of constant moderate lower abdominal pain that started four days ago. He states had a colostomy reversal and hernia repair done on 12/07/15. He had the surgery done due to a blood clot. He suspects that pain is due to infection in the surgical site, but wanted to wait until the check-up appointment today for evaluation. He was sent here to the ED after the CT scan done at the appointment. He reports taking Oxycodone up until a week ago, and then tylenol for the pain with some relief. He states no aggravating factors. He reports associated symptoms of chills, diaphoresis, SOB, and intermittent back pain. He states that he is currently on lisinopril, digoxin, and xarelto medications. He denies any decreased appetite, fever, chest pain, or other complaints. He is on xarelto with last dose today.    PCP: Julious Oka, MD  Past Medical History:  Diagnosis Date  . Allergy to IVP dye   . Atrial fibrillation ([place])    admx 11/13 with acute sCHF in setting of RVR  => a. failed DCCV x 2; b. Pradaxa started;  c. failed sotalol  . Cardiomyopathy ([place])    likely tachy mediated in setting of AF with RVR  . Chicken pox as a child  . Chronic anticoagulation    Pradaxa  . Chronic systolic heart failure ([place])    a. echo 11/13: Ef 40-45%, diff HK, mod MR, mod LAE, mild RVE, mod RAE, small effusion;   b. TEE 11/13:  EF 35-40%, no  LAA clot; Echo 2/14 shows normal EF  . Depression with anxiety 06/29/2011  . Dysrhythmia   . Hypertension   . 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 Migraine 06/29/2011  . Obesity 06/29/2011  . Snoring    patient needs sleep study - has declined    Patient Active Problem List   Diagnosis Date Noted  . Intra-abdominal infection 11/17/2015  . NICM (nonischemic cardiomyopathy) ([place]) 08/03/2015  . 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 Chronic atrial fibrillation ([place])   . Sepsis ([place]) 07/25/2015  . Anticoagulated on Coumadin 04/21/2015  . Prediabetes 04/16/2015  . Normocytic anemia 04/16/2015  . 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 Abdominal abscess ([place])   . Septic shock ([place]) 04/12/2015  . 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 Acute venous embolism and thrombosis of deep vessels of proximal lower extremity ([place]) [I82.4Y9] 02/11/2015  . Long term (current) use of anticoagulants [Z79.01] 02/11/2015  . Monitoring for long-term anticoagulant use 02/11/2015  . Intra-abdominal abscess ([place])   . Bowel perforation ([place])   . Pressure ulcer 01/26/2015  . Pleural effusion   . Perforated bowel ([place])   . Diverticulitis of colon with perforation 01/14/2015  . AKI (acute kidney injury) ([place])   . Chronic systolic heart failure ([place]) 12/08/2011  . Morbid obesity ([place]) 12/03/2011  . Depression with anxiety 06/29/2011  . Obesity 06/29/2011  Past Surgical History:  Procedure Laterality Date  . CARDIOVERSION  11/30/2011   Procedure: CARDIOVERSION;  Surgeon: Thayer Headings, MD;  Location: [facility];  Service: Cardiovascular;  Laterality: N/A;  . CARDIOVERSION  12/03/2011   Procedure: CARDIOVERSION;  Surgeon: Jolaine Artist, MD;  Location: [facility];  Service: Cardiovascular;  Laterality: N/A;  . COLECTOMY  12/07/2015   partial  . COLON SURGERY    . COLOSTOMY REVERSAL  12/07/2015  . COLOSTOMY REVERSAL N/A 12/07/2015   Procedure:  OPEN REVERSAL OF COLOSTOMY WITH PARTIAL SIGMOID COLECTOMY;  Surgeon: Leighton Ruff, MD;  Location: [facility];  Service: General;  Laterality: N/A;  . HERNIA REPAIR    . INCISIONAL HERNIA REPAIR  12/07/2015  . INCISIONAL HERNIA REPAIR N/A 12/07/2015   Procedure: INCISIONAL HERNIA REPAIR;  Surgeon: Leighton Ruff, MD;  Location: [facility];  Service: General;  Laterality: N/A;  . IR GENERIC HISTORICAL  08/11/2015   IR RADIOLOGIST EVAL & MGMT 08/11/2015 Markus Daft, MD [facility] INTERV RAD  . IR GENERIC HISTORICAL  11/18/2015   IR CATHETER TUBE CHANGE 11/18/2015 Corrie Mckusick, DO [facility]INTER[facility] RAD  . LAPAROTOMY N/A 01/17/2015   Procedure: EXPLORATORY LAPAROTOMY WITH LEFT COLECTOMY AND COLOSTOMY;  Surgeon: Rolm Bookbinder, MD;  Location: [facility];  Service: General;  Laterality: N/A;  . [place]  11/2015  . TEE WITHOUT CARDIOVERSION  11/30/2011   Procedure: TRANSESOPHAGEAL ECHOCARDIOGRAM (TEE);  Surgeon: Thayer Headings, MD;  Location: [facility];  Service: Cardiovascular;  Laterality: N/A;  . TONSILLECTOMY         Home Medications    Prior to Admission medications   Medication Sig Start Date End Date Taking? Authorizing Provider  acetaminophen (TYLENOL) 500 MG tablet Take 1,000 mg by mouth every 6 (six) hours as needed (pain).    Historical Provider, MD  digoxin (LANOXIN) 0.125 MG tablet Take 1 tablet (0.125 mg total) by mouth daily. 07/29/15   [name], MD  diltiazem (CARDIZEM CD) 180 MG 24 hr capsule Take 1 capsule (180 mg total) by mouth 2 (two) times daily. 12/03/15   Thompson Grayer, MD  furosemide (LASIX) 40 MG tablet Take 40 mg by mouth daily. Pt. Reports that he takes as needed 10/18/15   Historical Provider, MD  lisinopril (PRINIVIL,ZESTRIL) 5 MG tablet Take 5 mg by mouth daily. 10/18/15   Historical Provider, MD  metoprolol (LOPRESSOR) 100 MG tablet TAKE 1 TABLET (100 MG TOTAL) BY MOUTH 2 (TWO) TIMES DAILY. 11/16/15   Sherran Needs, NP  Multiple Vitamin  (MULTIVITAMIN WITH MINERALS) TABS tablet Take 1 tablet by mouth daily.    Historical Provider, MD  rivaroxaban (XARELTO) 20 MG TABS tablet Take 1 tablet (20 mg total) by mouth daily with supper. 09/23/15   Thompson Grayer, MD    Family History Family History  Problem Relation Age of Onset  . Hypertension Mother   . Hypertension Father   . Aneurysm Father   . Alcohol abuse Father   . Cancer Maternal Grandmother     brain  . Diabetes Maternal Grandfather     type 2  . Parkinsonism Maternal Grandfather   . Cancer Paternal Grandmother     breast  . Diabetes Paternal Grandmother   . Alcohol abuse Paternal Grandfather     Social History Social History  Substance Use Topics  . Smoking status: Former Smoker    Packs/day: 1.00    Years: 20.00    Types: Cigarettes    Quit date: 07/10/2014  . Smokeless  tobacco: Never Used  . Alcohol use No     Allergies   Contrast media [iodinated diagnostic agents]   Review of Systems Review of Systems A complete 10 system review of systems was obtained and all systems are negative except as noted in the HPI and PMH.    Physical Exam Updated Vital Signs BP 103/60 (BP Location: Left Arm)   Pulse 73   Temp 98.5 F (36.9 C) (Oral)   Resp 18   Ht 5\' 10"  (1.778 m)   Wt 255 lb (115.7 kg)   SpO2 98%   BMI 36.59 kg/m   Physical Exam  Constitutional: He is oriented to person, place, and time. He appears well-developed and well-nourished. No distress.  HENT:  Head: Normocephalic and atraumatic.  Mouth/Throat: Oropharynx is clear and moist. No oropharyngeal exudate.  Eyes: Conjunctivae and EOM are normal. Pupils are equal, round, and reactive to light.  Neck: Normal range of motion. Neck supple.  No meningismus.  Cardiovascular: Normal rate, normal heart sounds and intact distal pulses.   No murmur heard. Irregular rhythm   Pulmonary/Chest: Effort normal and breath sounds normal. No respiratory distress.  Abdominal: Soft. There is tenderness.  There is no rebound and no guarding.  Healing midline abdominal incision. Previous colostomy site is erythematous with purulent drainage. Diffuse lower abdominal TTP. No rebound or guarding.   Musculoskeletal: Normal range of motion. He exhibits no edema or tenderness.  Neurological: He is alert and oriented to person, place, and time. No cranial nerve deficit. He exhibits normal muscle tone. Coordination normal.   5/5 strength throughout. CN 2-12 intact.Equal grip strength.   Skin: Skin is warm.  Psychiatric: He has a normal mood and affect. His behavior is normal.  Nursing note and vitals reviewed.    ED Treatments / Results  DIAGNOSTIC STUDIES: Oxygen Saturation is 98% on RA, normal by my interpretation.    COORDINATION OF CARE: 11:08 PM- Pt advised of plan for treatment and pt agrees.  Labs (all labs ordered are listed, but only abnormal results are displayed) Labs Reviewed  COMPREHENSIVE METABOLIC PANEL - Abnormal; Notable for the following:       Result Value   Sodium 133 (*)    Chloride 100 (*)    Glucose, Bld 254 (*)    Creatinine, Ser 1.41 (*)    Albumin 3.2 (*)    GFR calc non Af Amer 58 (*)    All other components within normal limits  CBC - Abnormal; Notable for the following:    WBC 12.2 (*)    RBC 4.14 (*)    Hemoglobin 11.7 (*)    HCT 36.0 (*)    Platelets 574 (*)    All other components within normal limits  I-STAT CG4 LACTIC ACID, ED - Abnormal; Notable for the following:    Lactic Acid, Venous 2.93 (*)    All other components within normal limits  URINALYSIS, ROUTINE W REFLEX MICROSCOPIC    EKG  EKG Interpretation None       Radiology Ct Abdomen Pelvis W Contrast  Result Date: 01/08/2016 CLINICAL DATA:  Status post bilateral posterior rectus sheath release with advancement flap some primary abdominal wall closure after colostomy takedown on 12/07/2015. Patient with worsening abdominal pain. EXAM: CT ABDOMEN AND PELVIS WITH CONTRAST TECHNIQUE:  Multidetector CT imaging of the abdomen and pelvis was performed using the standard protocol following bolus administration of intravenous contrast. CONTRAST:  154mL ISOVUE-300 IOPAMIDOL (ISOVUE-300) INJECTION 61% COMPARISON:  11/17/2015 FINDINGS: Lower chest:  Subsegmental atelectasis noted left lower lobe. Hepatobiliary: 13 mm low-density lesion posterior right liver is stable in the interval. Liver otherwise unremarkable. Gallbladder decompressed. No intrahepatic or extrahepatic biliary dilation. Pancreas: No focal mass lesion. No dilatation of the main duct. No intraparenchymal cyst. No peripancreatic edema. Spleen: No splenomegaly. No focal mass lesion. Adrenals/Urinary Tract: Adrenal glands are stable in appearance. No enhancing lesion identified in either kidney. No hydronephrosis. No evidence for hydroureter. The urinary bladder appears normal for the degree of distention. Stomach/Bowel: Stomach is nondistended. No gastric wall thickening. No evidence of outlet obstruction. Duodenum is normally positioned as is the ligament of Treitz. No small bowel wall thickening. No small bowel dilatation. The terminal ileum is normal. The appendix is normal. Colon is foreshortened compatible with prior left colectomy and recent colostomy takedown. Vascular/Lymphatic: There is abdominal aortic atherosclerosis without aneurysm. Scattered small lymph nodes are seen in the hepatoduodenal ligament and retroperitoneal space. No overt lymphadenopathy. Insert no pelvic sidewall lymphadenopathy. Reproductive: The prostate gland and seminal vesicles have normal imaging features. Other: Irregular, multiloculated rim enhancing fluid collection is identified in the anterior abdomen. Dominant component of this collection measures approximately 17.7 by 5.6 by 9.1 cm. This collection is immediately deep to the anterior abdominal and may be extraperitoneal in position. A second smaller collection is seen in the central mesentery (image  57 series 2) measuring approximately 3.8 x 6.1 cm. Gas and fluid is seen in the subcutaneous tissues superficial to the rectus sheath with associated edema/inflammation and appears to communicate with a right paramidline wound. Musculoskeletal: Bone windows reveal no worrisome lytic or sclerotic osseous lesions. IMPRESSION: 1. Large irregular multiloculated abscess in the anterior aspect of the lower anterior abdomen, just deep to the rectus musculature. This could be in the preperitoneal space or potentially in the anterior peritoneal cavity. Imaging features are compatible with abscess. 2. A second smaller 4 x 6 cm abscess appears to be in the central mesentery. 3. Gas and fluid collections in the anterior subcutaneous fat appear to communicate with a right paramidline wound. 4. No intraperitoneal free fluid and no evidence for contrast extravasation. No features on the current study to overtly suggest anastomotic leak at the distal colonic suture line. I personally discussed the results of this study with Dr. Lucia Gaskins, covering for Dr. Donne Hazel, at approximately 1650 hours on 01/08/2016. Electronically Signed   By: Misty Stanley M.D.   On: 01/08/2016 17:02    Procedures Procedures (including critical care time)  Medications Ordered in ED Medications  sodium chloride 0.9 % bolus 1,000 mL (not administered)  0.9 %  sodium chloride infusion (not administered)  piperacillin-tazobactam (ZOSYN) IVPB 3.375 g (not administered)  vancomycin (VANCOCIN) 2,000 mg in sodium chloride 0.9 % 500 mL IVPB (not administered)  morphine 4 MG/ML injection 4 mg (4 mg Intravenous Given 01/08/16 2325)     Initial Impression / Assessment and Plan / ED Course  I have reviewed the triage vital signs and the nursing notes.  Pertinent labs & imaging results that were available during my care of the patient were reviewed by me and considered in my medical decision making (see chart for details).  Clinical Course     Patient presents with progressively worsening abdominal pain, chills. Had colostomy reversal on November 20. Had outpatient CT scan today that showed a large abdominal wall abscess as well as abscess of the mesentery.  Patient with purulence coming from former ostomy sites. He is stable hemodynamically. Labs show leukocytosis and mildly elevated a  lactate.  Patient IV fluids, IV antibiotics. BP and HR stable in the ED.  Lactate elevated.  D/w Dr. Norman Clay who will evaluate. Patient will be admitted to surgery service.   Final Clinical Impressions(s) / ED Diagnoses   Final diagnoses:  Abdominal abscess [facility])    New Prescriptions New Prescriptions   No medications on file  I personally performed the services described in this documentation, which was scribed in my presence. The recorded information has been reviewed and is accurate.     Ezequiel Essex, MD 01/09/16 [phone]

## 2016-01-08 NOTE — ED Triage Notes (Signed)
Pt presents to ED for assessment after having an abdominal CT for some swelling and fluid with behind his previous ostomy site.  Pt have reversal on Nov 20th.  Pt noted fevers, cold sweats, and milky discharge from the area.  Pt sts bowel movements are "kind of runny".  Pt sts his abdominal CT stated abdominal abscess and he was told to come here.  Referring doctor was Dr. Donne Hazel.

## 2016-01-09 DIAGNOSIS — Z820 Family history of epilepsy and other diseases of the nervous system: Secondary | ICD-10-CM | POA: Diagnosis not present

## 2016-01-09 DIAGNOSIS — I429 Cardiomyopathy, unspecified: Secondary | ICD-10-CM | POA: Diagnosis present

## 2016-01-09 DIAGNOSIS — Z91041 Radiographic dye allergy status: Secondary | ICD-10-CM | POA: Diagnosis not present

## 2016-01-09 DIAGNOSIS — N179 Acute kidney failure, unspecified: Secondary | ICD-10-CM | POA: Diagnosis present

## 2016-01-09 DIAGNOSIS — Z9889 Other specified postprocedural states: Secondary | ICD-10-CM | POA: Diagnosis not present

## 2016-01-09 DIAGNOSIS — K651 Peritoneal abscess: Secondary | ICD-10-CM | POA: Diagnosis not present

## 2016-01-09 DIAGNOSIS — Z87891 Personal history of nicotine dependence: Secondary | ICD-10-CM | POA: Diagnosis not present

## 2016-01-09 DIAGNOSIS — F418 Other specified anxiety disorders: Secondary | ICD-10-CM | POA: Diagnosis present

## 2016-01-09 DIAGNOSIS — Z6836 Body mass index (BMI) 36.0-36.9, adult: Secondary | ICD-10-CM | POA: Diagnosis not present

## 2016-01-09 DIAGNOSIS — K5720 Diverticulitis of large intestine with perforation and abscess without bleeding: Secondary | ICD-10-CM | POA: Diagnosis present

## 2016-01-09 DIAGNOSIS — D179 Benign lipomatous neoplasm, unspecified: Secondary | ICD-10-CM | POA: Diagnosis present

## 2016-01-09 DIAGNOSIS — Z833 Family history of diabetes mellitus: Secondary | ICD-10-CM | POA: Diagnosis not present

## 2016-01-09 DIAGNOSIS — I482 Chronic atrial fibrillation: Secondary | ICD-10-CM | POA: Diagnosis present

## 2016-01-09 DIAGNOSIS — Z7901 Long term (current) use of anticoagulants: Secondary | ICD-10-CM | POA: Diagnosis not present

## 2016-01-09 DIAGNOSIS — Z811 Family history of alcohol abuse and dependence: Secondary | ICD-10-CM | POA: Diagnosis not present

## 2016-01-09 DIAGNOSIS — I5022 Chronic systolic (congestive) heart failure: Secondary | ICD-10-CM | POA: Diagnosis present

## 2016-01-09 DIAGNOSIS — I110 Hypertensive heart disease with heart failure: Secondary | ICD-10-CM | POA: Diagnosis present

## 2016-01-09 DIAGNOSIS — Z8619 Personal history of other infectious and parasitic diseases: Secondary | ICD-10-CM | POA: Diagnosis not present

## 2016-01-09 DIAGNOSIS — T814XXA Infection following a procedure, initial encounter: Secondary | ICD-10-CM | POA: Diagnosis not present

## 2016-01-09 DIAGNOSIS — Z809 Family history of malignant neoplasm, unspecified: Secondary | ICD-10-CM | POA: Diagnosis not present

## 2016-01-09 DIAGNOSIS — Z8249 Family history of ischemic heart disease and other diseases of the circulatory system: Secondary | ICD-10-CM | POA: Diagnosis not present

## 2016-01-09 LAB — BASIC METABOLIC PANEL
ANION GAP: 9 (ref 5–15)
BUN: 19 mg/dL (ref 6–20)
CO2: 23 mmol/L (ref 22–32)
Calcium: 9.1 mg/dL (ref 8.9–10.3)
Chloride: 104 mmol/L (ref 101–111)
Creatinine, Ser: 1.27 mg/dL — ABNORMAL HIGH (ref 0.61–1.24)
GFR calc Af Amer: >60 mL/min (ref >60)
GFR calc non Af Amer: >60 mL/min (ref >60)
GLUCOSE: 169 mg/dL — AB (ref 65–99)
POTASSIUM: 4.3 mmol/L (ref 3.5–5.1)
Sodium: 136 mmol/L (ref 135–145)

## 2016-01-09 LAB — URINALYSIS, ROUTINE W REFLEX MICROSCOPIC
Bilirubin Urine: NEGATIVE
GLUCOSE, UA: NEGATIVE mg/dL
Hgb urine dipstick: NEGATIVE
Ketones, ur: NEGATIVE mg/dL
LEUKOCYTES UA: NEGATIVE
NITRITE: NEGATIVE
PROTEIN: NEGATIVE mg/dL
Specific Gravity, Urine: 1.024 (ref 1.005–1.030)
pH: 5 (ref 5.0–8.0)

## 2016-01-09 LAB — CBC
HEMATOCRIT: 31.4 % — AB (ref 39.0–52.0)
Hemoglobin: 9.6 g/dL — ABNORMAL LOW (ref 13.0–17.0)
MCH: 27.1 pg (ref 26.0–34.0)
MCHC: 30.6 g/dL (ref 30.0–36.0)
MCV: 88.7 fL (ref 78.0–100.0)
Platelets: 507 10*3/uL — ABNORMAL HIGH (ref 150–400)
RBC: 3.54 MIL/uL — AB (ref 4.22–5.81)
RDW: 15.4 % (ref 11.5–15.5)
WBC: 14.2 10*3/uL — AB (ref 4.0–10.5)

## 2016-01-09 LAB — PROTIME-INR
INR: 1.86
Prothrombin Time: 21.7 seconds — ABNORMAL HIGH (ref 11.4–15.2)

## 2016-01-09 LAB — I-STAT CG4 LACTIC ACID, ED: Lactic Acid, Venous: 1.21 mmol/L (ref 0.5–1.9)

## 2016-01-09 MED ORDER — DILTIAZEM HCL ER COATED BEADS 180 MG PO CP24
180.0000 mg | ORAL_CAPSULE | Freq: Two times a day (BID) | ORAL | Status: DC
  Administered 2016-01-09 – 2016-01-12 (×8): 180 mg via ORAL
  Filled 2016-01-09 (×8): qty 1

## 2016-01-09 MED ORDER — FUROSEMIDE 40 MG PO TABS
40.0000 mg | ORAL_TABLET | ORAL | Status: DC
  Administered 2016-01-09 – 2016-01-13 (×3): 40 mg via ORAL
  Filled 2016-01-09 (×3): qty 1

## 2016-01-09 MED ORDER — PIPERACILLIN-TAZOBACTAM 3.375 G IVPB
3.3750 g | Freq: Three times a day (TID) | INTRAVENOUS | Status: DC
  Administered 2016-01-09 – 2016-01-13 (×13): 3.375 g via INTRAVENOUS
  Filled 2016-01-09 (×14): qty 50

## 2016-01-09 MED ORDER — MORPHINE SULFATE (PF) 4 MG/ML IV SOLN
4.0000 mg | Freq: Once | INTRAVENOUS | Status: AC
  Administered 2016-01-09: 4 mg via INTRAVENOUS
  Filled 2016-01-09: qty 1

## 2016-01-09 MED ORDER — LISINOPRIL 5 MG PO TABS
5.0000 mg | ORAL_TABLET | Freq: Every day | ORAL | Status: DC
  Administered 2016-01-09 – 2016-01-13 (×5): 5 mg via ORAL
  Filled 2016-01-09 (×5): qty 1

## 2016-01-09 MED ORDER — ONDANSETRON 4 MG PO TBDP: 4.0000 mg | ORAL_TABLET | Freq: Four times a day (QID) | ORAL | Status: DC | PRN

## 2016-01-09 MED ORDER — METHOCARBAMOL 500 MG PO TABS
500.0000 mg | ORAL_TABLET | Freq: Four times a day (QID) | ORAL | Status: DC | PRN
  Administered 2016-01-10 – 2016-01-12 (×4): 500 mg via ORAL
  Filled 2016-01-09 (×4): qty 1

## 2016-01-09 MED ORDER — OXYCODONE HCL 5 MG PO TABS
5.0000 mg | ORAL_TABLET | ORAL | Status: DC | PRN
  Administered 2016-01-09 – 2016-01-13 (×8): 10 mg via ORAL
  Filled 2016-01-09 (×8): qty 2

## 2016-01-09 MED ORDER — ACETAMINOPHEN 500 MG PO TABS: 1000.0000 mg | ORAL_TABLET | Freq: Four times a day (QID) | ORAL | Status: DC | PRN

## 2016-01-09 MED ORDER — METOPROLOL TARTRATE 100 MG PO TABS
100.0000 mg | ORAL_TABLET | Freq: Two times a day (BID) | ORAL | Status: DC
  Administered 2016-01-09 – 2016-01-13 (×9): 100 mg via ORAL
  Filled 2016-01-09 (×9): qty 1

## 2016-01-09 MED ORDER — ENOXAPARIN SODIUM 120 MG/0.8ML ~~LOC~~ SOLN: 120.0000 mg | Freq: Two times a day (BID) | SUBCUTANEOUS | Status: DC

## 2016-01-09 MED ORDER — PNEUMOCOCCAL VAC POLYVALENT 25 MCG/0.5ML IJ INJ
0.5000 mL | INJECTION | INTRAMUSCULAR | Status: AC
  Administered 2016-01-10: 0.5 mL via INTRAMUSCULAR
  Filled 2016-01-09: qty 0.5

## 2016-01-09 MED ORDER — INFLUENZA VAC SPLIT QUAD 0.5 ML IM SUSY
0.5000 mL | PREFILLED_SYRINGE | INTRAMUSCULAR | Status: AC
  Administered 2016-01-10: 0.5 mL via INTRAMUSCULAR
  Filled 2016-01-09: qty 0.5

## 2016-01-09 MED ORDER — SIMETHICONE 80 MG PO CHEW: 40.0000 mg | CHEWABLE_TABLET | Freq: Four times a day (QID) | ORAL | Status: DC | PRN

## 2016-01-09 MED ORDER — ADULT MULTIVITAMIN W/MINERALS CH
1.0000 | ORAL_TABLET | Freq: Every day | ORAL | Status: DC
Start: 2016-01-09 — End: 2016-01-13
  Administered 2016-01-09 – 2016-01-13 (×5): 1 via ORAL
  Filled 2016-01-09 (×5): qty 1

## 2016-01-09 MED ORDER — DIPHENHYDRAMINE HCL 50 MG/ML IJ SOLN: 25.0000 mg | Freq: Four times a day (QID) | INTRAMUSCULAR | Status: DC | PRN

## 2016-01-09 MED ORDER — DIGOXIN 125 MCG PO TABS
0.1250 mg | ORAL_TABLET | Freq: Every day | ORAL | Status: DC
  Administered 2016-01-09 – 2016-01-13 (×5): 0.125 mg via ORAL
  Filled 2016-01-09 (×6): qty 1

## 2016-01-09 MED ORDER — DEXTROSE-NACL 5-0.45 % IV SOLN
INTRAVENOUS | Status: DC
  Administered 2016-01-09 – 2016-01-13 (×5): via INTRAVENOUS

## 2016-01-09 MED ORDER — DOCUSATE SODIUM 100 MG PO CAPS
100.0000 mg | ORAL_CAPSULE | Freq: Two times a day (BID) | ORAL | Status: DC
  Administered 2016-01-09 – 2016-01-13 (×9): 100 mg via ORAL
  Filled 2016-01-09 (×9): qty 1

## 2016-01-09 MED ORDER — ZOLPIDEM TARTRATE 5 MG PO TABS: 5.0000 mg | ORAL_TABLET | Freq: Every evening | ORAL | Status: DC | PRN

## 2016-01-09 MED ORDER — VANCOMYCIN HCL IN DEXTROSE 750-5 MG/150ML-% IV SOLN
750.0000 mg | Freq: Two times a day (BID) | INTRAVENOUS | Status: DC
  Administered 2016-01-09 – 2016-01-12 (×8): 750 mg via INTRAVENOUS
  Filled 2016-01-09 (×8): qty 150

## 2016-01-09 MED ORDER — ONDANSETRON HCL 4 MG/2ML IJ SOLN: 4.0000 mg | Freq: Four times a day (QID) | INTRAMUSCULAR | Status: DC | PRN

## 2016-01-09 MED ORDER — MORPHINE SULFATE (PF) 2 MG/ML IV SOLN
1.0000 mg | INTRAVENOUS | Status: DC | PRN
  Administered 2016-01-09 – 2016-01-10 (×6): 4 mg via INTRAVENOUS
  Administered 2016-01-10: 2 mg via INTRAVENOUS
  Administered 2016-01-10 – 2016-01-12 (×8): 4 mg via INTRAVENOUS
  Administered 2016-01-12: 2 mg via INTRAVENOUS
  Administered 2016-01-13: 4 mg via INTRAVENOUS
  Filled 2016-01-09 (×12): qty 2
  Filled 2016-01-09: qty 1
  Filled 2016-01-09: qty 2
  Filled 2016-01-09: qty 1
  Filled 2016-01-09 (×2): qty 2

## 2016-01-09 MED ORDER — DIPHENHYDRAMINE HCL 25 MG PO CAPS: 25.0000 mg | ORAL_CAPSULE | Freq: Four times a day (QID) | ORAL | Status: DC | PRN

## 2016-01-09 MED ORDER — ENOXAPARIN SODIUM 120 MG/0.8ML ~~LOC~~ SOLN
120.0000 mg | Freq: Two times a day (BID) | SUBCUTANEOUS | Status: DC
  Administered 2016-01-10 – 2016-01-12 (×5): 120 mg via SUBCUTANEOUS
  Filled 2016-01-09 (×6): qty 0.8

## 2016-01-09 NOTE — Progress Notes (Signed)
Subjective: Doing pretty well. Some abdominal pain but not severe.  Objective: Vital signs in last 24 hours: Temp:  [97.7 F (36.5 C)-98.5 F (36.9 C)] 97.8 F (36.6 C) (12/23 0545) Pulse Rate:  [72-110] 72 (12/23 0545) Resp:  [15-24] 18 (12/23 0545) BP: (100-111)/(50-70) 100/63 (12/23 0545) SpO2:  [96 %-99 %] 99 % (12/23 0545) Weight:  [115.7 kg (255 lb)] 115.7 kg (255 lb) (12/22 1956) Last BM Date: 01/08/16  Intake/Output from previous day: 12/22 0701 - 12/23 0700 In: 1820.7 [I.V.:270.7; IV Piggyback:1550] Out: 200 [Urine:200] Intake/Output this shift: No intake/output data recorded.  General appearance: alert, cooperative and no distress GI: Moderately obese. Mild lower abdominal tenderness. Midline wound well-healed. Colostomy closure wound right lateral abdomen with purulent drainage.   Lab Results:   Recent Labs  01/08/16 1959 01/09/16 0433  WBC 12.2* 14.2*  HGB 11.7* 9.6*  HCT 36.0* 31.4*  PLT 574* 507*   BMET  Recent Labs  01/08/16 1959 01/09/16 0433  NA 133* 136  K 4.6 4.3  CL 100* 104  CO2 23 23  GLUCOSE 254* 169*  BUN 20 19  CREATININE 1.41* 1.27*  CALCIUM 10.0 9.1     Studies/Results: Ct Abdomen Pelvis W Contrast  Result Date: 01/08/2016 CLINICAL DATA:  Status post bilateral posterior rectus sheath release with advancement flap some primary abdominal wall closure after colostomy takedown on 12/07/2015. Patient with worsening abdominal pain. EXAM: CT ABDOMEN AND PELVIS WITH CONTRAST TECHNIQUE: Multidetector CT imaging of the abdomen and pelvis was performed using the standard protocol following bolus administration of intravenous contrast. CONTRAST:  18mL ISOVUE-300 IOPAMIDOL (ISOVUE-300) INJECTION 61% COMPARISON:  11/17/2015 FINDINGS: Lower chest:  Subsegmental atelectasis noted left lower lobe. Hepatobiliary: 13 mm low-density lesion posterior right liver is stable in the interval. Liver otherwise unremarkable. Gallbladder decompressed.  No intrahepatic or extrahepatic biliary dilation. Pancreas: No focal mass lesion. No dilatation of the main duct. No intraparenchymal cyst. No peripancreatic edema. Spleen: No splenomegaly. No focal mass lesion. Adrenals/Urinary Tract: Adrenal glands are stable in appearance. No enhancing lesion identified in either kidney. No hydronephrosis. No evidence for hydroureter. The urinary bladder appears normal for the degree of distention. Stomach/Bowel: Stomach is nondistended. No gastric wall thickening. No evidence of outlet obstruction. Duodenum is normally positioned as is the ligament of Treitz. No small bowel wall thickening. No small bowel dilatation. The terminal ileum is normal. The appendix is normal. Colon is foreshortened compatible with prior left colectomy and recent colostomy takedown. Vascular/Lymphatic: There is abdominal aortic atherosclerosis without aneurysm. Scattered small lymph nodes are seen in the hepatoduodenal ligament and retroperitoneal space. No overt lymphadenopathy. Insert no pelvic sidewall lymphadenopathy. Reproductive: The prostate gland and seminal vesicles have normal imaging features. Other: Irregular, multiloculated rim enhancing fluid collection is identified in the anterior abdomen. Dominant component of this collection measures approximately 17.7 by 5.6 by 9.1 cm. This collection is immediately deep to the anterior abdominal and may be extraperitoneal in position. A second smaller collection is seen in the central mesentery (image 57 series 2) measuring approximately 3.8 x 6.1 cm. Gas and fluid is seen in the subcutaneous tissues superficial to the rectus sheath with associated edema/inflammation and appears to communicate with a right paramidline wound. Musculoskeletal: Bone windows reveal no worrisome lytic or sclerotic osseous lesions. IMPRESSION: 1. Large irregular multiloculated abscess in the anterior aspect of the lower anterior abdomen, just deep to the rectus  musculature. This could be in the preperitoneal space or potentially in the anterior peritoneal cavity. Imaging  features are compatible with abscess. 2. A second smaller 4 x 6 cm abscess appears to be in the central mesentery. 3. Gas and fluid collections in the anterior subcutaneous fat appear to communicate with a right paramidline wound. 4. No intraperitoneal free fluid and no evidence for contrast extravasation. No features on the current study to overtly suggest anastomotic leak at the distal colonic suture line. I personally discussed the results of this study with Dr. Lucia Gaskins, covering for Dr. Donne Hazel, at approximately 1650 hours on 01/08/2016. Electronically Signed   By: Misty Stanley M.D.   On: 01/08/2016 17:02    Anti-infectives: Anti-infectives    Start     Dose/Rate Route Frequency Ordered Stop   01/09/16 1000  vancomycin (VANCOCIN) IVPB 750 mg/150 ml premix     750 mg 150 mL/hr over 60 Minutes Intravenous Every 12 hours 01/09/16 0019     01/09/16 0600  piperacillin-tazobactam (ZOSYN) IVPB 3.375 g     3.375 g 12.5 mL/hr over 240 Minutes Intravenous Every 8 hours 01/09/16 0019     01/08/16 2330  vancomycin (VANCOCIN) IVPB 1000 mg/200 mL premix  Status:  Discontinued     1,000 mg 200 mL/hr over 60 Minutes Intravenous  Once 01/08/16 2323 01/08/16 2327   01/08/16 2330  piperacillin-tazobactam (ZOSYN) IVPB 3.375 g     3.375 g 12.5 mL/hr over 240 Minutes Intravenous  Once 01/08/16 2324 01/09/16 0256   01/08/16 2330  vancomycin (VANCOCIN) 2,000 mg in sodium chloride 0.9 % 500 mL IVPB     2,000 mg 250 mL/hr over 120 Minutes Intravenous  Once 01/08/16 2327 01/09/16 0256      Assessment/Plan: 1 month status post colostomy takedown and repair of large ventral incisional hernia. Intra-abdominal or preperitoneal abscess. Partially draining through old colostomy wound. Should be amenable to percutaneous drainage. I discussed with interventional radiology. They plan to proceed with  percutaneous drainage today or tomorrow depending on previous timing of his anticoagulation dosage. Discussed with patient and questions answered.     LOS: 0 days    William Fischer 12/23/2017Patient ID: William Fischer, male   DOB: May 17, 1965, 50 y.o.   MRN: [id number]

## 2016-01-09 NOTE — Progress Notes (Signed)
Pharmacy Antibiotic Note  William Fischer is a 50 y.o. male admitted on 01/08/2016 with abdominal abscess/wound infection.  Pharmacy has been consulted for Vancomycin/Zosyn dosing. Pt presents to ED after outpatient CT. WBC mildly elevated and mild bump in Scr (normalized CrCl ~65).   Plan: -Vancomycin 2000 mg IV x 1, then give 750 mg IV q12h -Zosyn 3.375G IV q8h to be infused over 4 hours -Trend WBC, temp, renal function  -Drug levels as indicated  Height: 5\' 10"  (177.8 cm) Weight: 255 lb (115.7 kg) IBW/kg (Calculated) : 73  Temp (24hrs), Avg:98.4 F (36.9 C), Min:98.2 F (36.8 C), Max:98.5 F (36.9 C)   Recent Labs Lab 01/08/16 1959 01/08/16 2335  WBC 12.2*  --   CREATININE 1.41*  --   LATICACIDVEN  --  2.93*    Estimated Creatinine Clearance: 81.7 mL/min (by C-G formula based on SCr of 1.41 mg/dL (H)).    Allergies  Allergen Reactions  . Contrast Media [Iodinated Diagnostic Agents] Rash    a diffuse macular rash after CTA chest  Pt was premedicated with 125mg  IV Solumedrol, 50mg  IV Benadryl 1 hr prior to CTexam, and tolerated procedure without any difficulties. 11/28/11 Also same pre med protocol observed on 02/22/15 and pt tolerated the procedure well    Narda Bonds 01/09/2016 12:10 AM

## 2016-01-09 NOTE — Consult Note (Signed)
Chief Complaint: Patient was seen in consultation today for intra abdominal abscess drain placement Chief Complaint  Patient presents with  . Abdominal Pain   at the request of Dr Olen Pel  Referring Physician(s): Dr Adonis Housekeeper  Supervising Physician: Marybelle Killings  Patient Status: [facility] - In-pt  History of Present Illness: William Fischer is a 50 y.o. male   Known to IR Has had several drains placed in last several months Hx colostomy takedown and repair of large ventral incisional hernia (01/2015) Drainage from colostomy wound CT yesterday: IMPRESSION: 1. Large irregular multiloculated abscess in the anterior aspect of the lower anterior abdomen, just deep to the rectus musculature. This could be in the preperitoneal space or potentially in the anterior peritoneal cavity. Imaging features are compatible with abscess. 2. A second smaller 4 x 6 cm abscess appears to be in the central mesentery. 3. Gas and fluid collections in the anterior subcutaneous fat appear to communicate with a right paramidline wound. 4. No intraperitoneal free fluid and no evidence for contrast extravasation. No features on the current study to overtly suggest anastomotic leak at the distal colonic suture line  Reuest now for intraabdominal abscess drain Dr Art Barbie Banner has reviewed imaging and approves procedure Scheduled or 12/24----now off Xarelto and Lovenox held  Past Medical History:  Diagnosis Date  . Allergy to IVP dye   . Atrial fibrillation ([place])    admx 11/13 with acute sCHF in setting of RVR  => a. failed DCCV x 2; b. Pradaxa started;  c. failed sotalol  . Cardiomyopathy ([place])    likely tachy mediated in setting of AF with RVR  . Chicken pox as a child  . Chronic anticoagulation    Pradaxa  . Chronic systolic heart failure ([place])    a. echo 11/13: Ef 40-45%, diff HK, mod MR, mod LAE, mild RVE, mod RAE, small effusion;   b. TEE 11/13:  EF 35-40%, no LAA clot; Echo 2/14 shows  normal EF  . Depression with anxiety 06/29/2011  . Dysrhythmia   . Hypertension   . 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 Migraine 06/29/2011  . Obesity 06/29/2011  . Snoring    patient needs sleep study - has declined    Past Surgical History:  Procedure Laterality Date  . CARDIOVERSION  11/30/2011   Procedure: CARDIOVERSION;  Surgeon: Thayer Headings, MD;  Location: [place];  Service: Cardiovascular;  Laterality: N/A;  . CARDIOVERSION  12/03/2011   Procedure: CARDIOVERSION;  Surgeon: Jolaine Artist, MD;  Location: [place];  Service: Cardiovascular;  Laterality: N/A;  . COLECTOMY  12/07/2015   partial  . COLON SURGERY    . COLOSTOMY REVERSAL  12/07/2015  . COLOSTOMY REVERSAL N/A 12/07/2015   Procedure: OPEN REVERSAL OF COLOSTOMY WITH PARTIAL SIGMOID COLECTOMY;  Surgeon: Leighton Ruff, MD;  Location: [place];  Service: General;  Laterality: N/A;  . HERNIA REPAIR    . INCISIONAL HERNIA REPAIR  12/07/2015  . INCISIONAL HERNIA REPAIR N/A 12/07/2015   Procedure: INCISIONAL HERNIA REPAIR;  Surgeon: Leighton Ruff, MD;  Location: [place];  Service: General;  Laterality: N/A;  . IR GENERIC HISTORICAL  08/11/2015   IR RADIOLOGIST EVAL & MGMT 08/11/2015 Markus Daft, MD [facility] INTERV RAD  . IR GENERIC HISTORICAL  11/18/2015   IR CATHETER TUBE CHANGE 11/18/2015 Corrie Mckusick, DO [facility]INTER[facility] RAD  . LAPAROTOMY N/A 01/17/2015   Procedure: EXPLORATORY LAPAROTOMY WITH LEFT COLECTOMY AND COLOSTOMY;  Surgeon: Rolm Bookbinder, MD;  Location: [facility]  OR;  Service: General;  Laterality: N/A;  . PERIPHERALLY INSERTED CENTRAL CATHETER INSERTION  11/2015  . TEE WITHOUT CARDIOVERSION  11/30/2011   Procedure: TRANSESOPHAGEAL ECHOCARDIOGRAM (TEE);  Surgeon: Thayer Headings, MD;  Location: [place];  Service: Cardiovascular;  Laterality: N/A;  . TONSILLECTOMY      Allergies: Contrast media [iodinated diagnostic agents]  Medications: Prior to Admission medications   Medication Sig Start Date End Date Taking?  Authorizing Provider  acetaminophen (TYLENOL) 500 MG tablet Take 1,000 mg by mouth every 6 (six) hours as needed (pain).   Yes Historical Provider, MD  digoxin (LANOXIN) 0.125 MG tablet Take 1 tablet (0.125 mg total) by mouth daily. 07/29/15  Yes [name], MD  diltiazem (CARDIZEM CD) 180 MG 24 hr capsule Take 1 capsule (180 mg total) by mouth 2 (two) times daily. 12/03/15  Yes Thompson Grayer, MD  furosemide (LASIX) 40 MG tablet Take 40 mg by mouth daily as needed for fluid.  10/18/15  Yes Historical Provider, MD  lisinopril (PRINIVIL,ZESTRIL) 5 MG tablet Take 5 mg by mouth daily. 10/18/15  Yes Historical Provider, MD  metoprolol (LOPRESSOR) 100 MG tablet TAKE 1 TABLET (100 MG TOTAL) BY MOUTH 2 (TWO) TIMES DAILY. 11/16/15  Yes Sherran Needs, NP  Multiple Vitamin (MULTIVITAMIN WITH MINERALS) TABS tablet Take 1 tablet by mouth daily.   Yes Historical Provider, MD  rivaroxaban (XARELTO) 20 MG TABS tablet Take 1 tablet (20 mg total) by mouth daily with supper. 09/23/15  Yes Thompson Grayer, MD     Family History  Problem Relation Age of Onset  . Hypertension Mother   . Hypertension Father   . Aneurysm Father   . Alcohol abuse Father   . Cancer Maternal Grandmother     brain  . Diabetes Maternal Grandfather     type 2  . Parkinsonism Maternal Grandfather   . Cancer Paternal Grandmother     breast  . Diabetes Paternal Grandmother   . Alcohol abuse Paternal Grandfather     Social History   Social History  . Marital status: Divorced    Spouse name: N/A  . Number of children: N/A  . Years of education: N/A   Social History Main Topics  . Smoking status: Former Smoker    Packs/day: 1.00    Years: 20.00    Types: Cigarettes    Quit date: 07/10/2014  . Smokeless tobacco: Never Used  . Alcohol use No  . Drug use: No  . Sexual activity: No   Other Topics Concern  . None   Social History Narrative  . None    Review of Systems: A 12 point ROS discussed and pertinent positives are  indicated in the HPI above.  All other systems are negative.  Review of Systems  Constitutional: Positive for activity change, appetite change and fatigue. Negative for fever.  Respiratory: Negative for shortness of breath.   Gastrointestinal: Positive for abdominal pain.  Psychiatric/Behavioral: Negative for behavioral problems and confusion.    Vital Signs: BP 100/63 (BP Location: Right Arm)   Pulse 72   Temp 97.8 F (36.6 C) (Oral)   Resp 18   Ht 5\' 10"  (1.778 m)   Wt 255 lb (115.7 kg)   SpO2 99%   BMI 36.59 kg/m   Physical Exam  Constitutional: He is oriented to person, place, and time.  Cardiovascular: Normal rate and regular rhythm.   Pulmonary/Chest: Effort normal and breath sounds normal.  Abdominal: Soft. Bowel sounds are normal. There is tenderness.  Musculoskeletal: Normal range of motion.  Neurological: He is alert and oriented to person, place, and time.  Skin: Skin is warm.  abd wound drainage- brown pus like material  Nursing note reviewed.   Mallampati Score:  MD Evaluation Airway: WNL Heart: WNL Abdomen: WNL Chest/ Lungs: WNL ASA  Classification: 3 Mallampati/Airway Score: One  Imaging: Ct Abdomen Pelvis W Contrast  Result Date: 01/08/2016 CLINICAL DATA:  Status post bilateral posterior rectus sheath release with advancement flap some primary abdominal wall closure after colostomy takedown on 12/07/2015. Patient with worsening abdominal pain. EXAM: CT ABDOMEN AND PELVIS WITH CONTRAST TECHNIQUE: Multidetector CT imaging of the abdomen and pelvis was performed using the standard protocol following bolus administration of intravenous contrast. CONTRAST:  149mL ISOVUE-300 IOPAMIDOL (ISOVUE-300) INJECTION 61% COMPARISON:  11/17/2015 FINDINGS: Lower chest:  Subsegmental atelectasis noted left lower lobe. Hepatobiliary: 13 mm low-density lesion posterior right liver is stable in the interval. Liver otherwise unremarkable. Gallbladder decompressed. No  intrahepatic or extrahepatic biliary dilation. Pancreas: No focal mass lesion. No dilatation of the main duct. No intraparenchymal cyst. No peripancreatic edema. Spleen: No splenomegaly. No focal mass lesion. Adrenals/Urinary Tract: Adrenal glands are stable in appearance. No enhancing lesion identified in either kidney. No hydronephrosis. No evidence for hydroureter. The urinary bladder appears normal for the degree of distention. Stomach/Bowel: Stomach is nondistended. No gastric wall thickening. No evidence of outlet obstruction. Duodenum is normally positioned as is the ligament of Treitz. No small bowel wall thickening. No small bowel dilatation. The terminal ileum is normal. The appendix is normal. Colon is foreshortened compatible with prior left colectomy and recent colostomy takedown. Vascular/Lymphatic: There is abdominal aortic atherosclerosis without aneurysm. Scattered small lymph nodes are seen in the hepatoduodenal ligament and retroperitoneal space. No overt lymphadenopathy. Insert no pelvic sidewall lymphadenopathy. Reproductive: The prostate gland and seminal vesicles have normal imaging features. Other: Irregular, multiloculated rim enhancing fluid collection is identified in the anterior abdomen. Dominant component of this collection measures approximately 17.7 by 5.6 by 9.1 cm. This collection is immediately deep to the anterior abdominal and may be extraperitoneal in position. A second smaller collection is seen in the central mesentery (image 57 series 2) measuring approximately 3.8 x 6.1 cm. Gas and fluid is seen in the subcutaneous tissues superficial to the rectus sheath with associated edema/inflammation and appears to communicate with a right paramidline wound. Musculoskeletal: Bone windows reveal no worrisome lytic or sclerotic osseous lesions. IMPRESSION: 1. Large irregular multiloculated abscess in the anterior aspect of the lower anterior abdomen, just deep to the rectus musculature.  This could be in the preperitoneal space or potentially in the anterior peritoneal cavity. Imaging features are compatible with abscess. 2. A second smaller 4 x 6 cm abscess appears to be in the central mesentery. 3. Gas and fluid collections in the anterior subcutaneous fat appear to communicate with a right paramidline wound. 4. No intraperitoneal free fluid and no evidence for contrast extravasation. No features on the current study to overtly suggest anastomotic leak at the distal colonic suture line. I personally discussed the results of this study with Dr. Lucia Gaskins, covering for Dr. Donne Hazel, at approximately 1650 hours on 01/08/2016. Electronically Signed   By: Misty Stanley M.D.   On: 01/08/2016 17:02    Labs:  CBC:  Recent Labs  12/12/15 0500 12/13/15 0052 01/08/16 1959 01/09/16 0433  WBC 12.0* 11.1* 12.2* 14.2*  HGB 8.7* 8.7* 11.7* 9.6*  HCT 27.4* 26.4* 36.0* 31.4*  PLT 343 292 574* 507*  COAGS:  Recent Labs  04/13/15 1730  09/29/15 0138 09/30/15 0833 12/07/15 0759 12/08/15 0951 12/08/15 1415 01/09/16 0433  INR 2.62*  < > 2.08 1.32 1.12  --   --  1.86  APTT 49*  --   --   --   --  30 39*  --   < > = values in this interval not displayed.  BMP:  Recent Labs  12/14/15 0703 12/15/15 0416 01/08/16 1959 01/09/16 0433  NA 138 140 133* 136  K 3.6 3.4* 4.6 4.3  CL 99* 104 100* 104  CO2 29 27 23 23   GLUCOSE 123* 108* 254* 169*  BUN 5* 5* 20 19  CALCIUM 8.9 9.0 10.0 9.1  CREATININE 0.97 1.13 1.41* 1.27*  GFRNONAA >60 >60 58* >60  GFRAA >60 >60 >60 >60    LIVER FUNCTION TESTS:  Recent Labs  10/23/15 1149 11/17/15 1446 11/18/15 0709 01/08/16 1959  BILITOT 0.9 0.9 0.3 0.3  AST 15 37 20 21  ALT 18 34 33 23  ALKPHOS 57 112 96 67  PROT 7.2 7.2 6.9 8.1  ALBUMIN 3.6 2.6* 2.4* 3.2*    TUMOR MARKERS: No results for input(s): AFPTM, CEA, CA199, CHROMGRNA in the last 8760 hours.  Assessment and Plan:  Hx  Perforated bowel with later colostomy takedown  and incisional ventral hernia repair - Jan 2017 Many abscesses and drains New abd pain and elevated wbc New abscess noted on CT Now for drain placement Scheduled for 12/24 Risks and Benefits discussed with the patient including bleeding, infection, damage to adjacent structures, bowel perforation/fistula connection, and sepsis. All of the patient's questions were answered, patient is agreeable to proceed. Consent signed and in chart.   Thank you for this interesting consult.  I greatly enjoyed meeting William Fischer and look forward to participating in their care.  A copy of this report was sent to the requesting provider on this date.  Electronically Signed: Tylee Yum A 01/09/2016, 9:06 AM   I spent a total of 40 Minutes    in face to face in clinical consultation, greater than 50% of which was counseling/coordinating care for intra abdominal abscess drain placement

## 2016-01-09 NOTE — Progress Notes (Signed)
ANTICOAGULATION CONSULT NOTE - Initial Consult  Pharmacy Consult for Lovenox (holding Xarelto) Indication: atrial fibrillation  Allergies  Allergen Reactions  . Contrast Media [Iodinated Diagnostic Agents] Rash    a diffuse macular rash after CTA chest  Pt was premedicated with 125mg  IV Solumedrol, 50mg  IV Benadryl 1 hr prior to CTexam, and tolerated procedure without any difficulties. 11/28/11 Also same pre med protocol observed on 02/22/15 and pt tolerated the procedure well    Patient Measurements: Height: 5\' 10"  (177.8 cm) Weight: 255 lb (115.7 kg) IBW/kg (Calculated) : 73  Vital Signs: Temp: 97.7 F (36.5 C) (12/23 0305) Temp Source: Oral (12/23 0305) BP: 102/65 (12/23 0305) Pulse Rate: 79 (12/23 0305)  Labs:  Recent Labs  01/08/16 1959  HGB 11.7*  HCT 36.0*  PLT 574*  CREATININE 1.41*    Estimated Creatinine Clearance: 81.7 mL/min (by C-G formula based on SCr of 1.41 mg/dL (H)).   Medical History: Past Medical History:  Diagnosis Date  . Allergy to IVP dye   . Atrial fibrillation ([place])    admx 11/13 with acute sCHF in setting of RVR  => a. failed DCCV x 2; b. Pradaxa started;  c. failed sotalol  . Cardiomyopathy ([place])    likely tachy mediated in setting of AF with RVR  . Chicken pox as a child  . Chronic anticoagulation    Pradaxa  . Chronic systolic heart failure ([place])    a. echo 11/13: Ef 40-45%, diff HK, mod MR, mod LAE, mild RVE, mod RAE, small effusion;   b. TEE 11/13:  EF 35-40%, no LAA clot; Echo 2/14 shows normal EF  . Depression with anxiety 06/29/2011  . Dysrhythmia   . Hypertension   . 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 Migraine 06/29/2011  . Obesity 06/29/2011  . Snoring    patient needs sleep study - has declined    Assessment: Holding PTA Xarelto and starting Lovenox in anticipation of procedure for abdominal abscess, last dose of Xarelto was 12/22 at 1800 so Lovenox will not start until 12/23 at 1800, Hgb 11.7, mild bump in Scr.   Goal  of Therapy:  Monitor platelets by anticoagulation protocol: Yes   Plan:  -Lovenox 1 mg/kg subcutaneous q12h starting 12/23 at 1800 -Minimum q72h CBC while inpatient -Monitor for bleeding -F/U plans for procedure (perc drainage per IR if possible)  Narda Bonds 01/09/2016,3:26 AM

## 2016-01-10 ENCOUNTER — Inpatient Hospital Stay (HOSPITAL_COMMUNITY): Payer: BLUE CROSS/BLUE SHIELD

## 2016-01-10 ENCOUNTER — Encounter (HOSPITAL_COMMUNITY): Payer: Self-pay | Admitting: *Deleted

## 2016-01-10 LAB — PROTIME-INR
INR: 1.21
PROTHROMBIN TIME: 15.4 s — AB (ref 11.4–15.2)

## 2016-01-10 MED ORDER — MIDAZOLAM HCL 2 MG/2ML IJ SOLN
INTRAMUSCULAR | Status: AC
  Filled 2016-01-10: qty 4

## 2016-01-10 MED ORDER — FENTANYL CITRATE (PF) 100 MCG/2ML IJ SOLN
INTRAMUSCULAR | Status: AC | PRN
  Administered 2016-01-10: 25 ug via INTRAVENOUS
  Administered 2016-01-10: 50 ug via INTRAVENOUS

## 2016-01-10 MED ORDER — MIDAZOLAM HCL 2 MG/2ML IJ SOLN
INTRAMUSCULAR | Status: AC | PRN
  Administered 2016-01-10: 0.5 mg via INTRAVENOUS

## 2016-01-10 MED ORDER — LIDOCAINE HCL 1 % IJ SOLN
INTRAMUSCULAR | Status: AC
  Filled 2016-01-10: qty 20

## 2016-01-10 MED ORDER — LORAZEPAM 2 MG/ML IJ SOLN
INTRAMUSCULAR | Status: AC | PRN
  Administered 2016-01-10: 0.5 mg via INTRAVENOUS

## 2016-01-10 MED ORDER — FENTANYL CITRATE (PF) 100 MCG/2ML IJ SOLN
INTRAMUSCULAR | Status: AC
  Filled 2016-01-10: qty 4

## 2016-01-10 NOTE — Sedation Documentation (Signed)
Patient denies pain and is resting comfortably.  

## 2016-01-10 NOTE — Progress Notes (Signed)
Patient returned to unit from procedure. JP drain to right side. Gauze clean, dry, and intact.

## 2016-01-10 NOTE — Procedures (Signed)
Successful CT ANTERIOR ABDOMINAL ABSCESS DRAIN  150CC EXUDATIVE BLOOD ASPIRATED C/W INFECTED HEMATOMA  No comp Stable Keep to suction bulb Full report in PACS GS/CX SENT

## 2016-01-10 NOTE — Progress Notes (Signed)
Subjective: He feels about the same. Still with lower abdominal discomfort.  Objective: Vital signs in last 24 hours: Temp:  [97.6 F (36.4 C)-98.6 F (37 C)] 98 F (36.7 C) (12/24 0528) Pulse Rate:  [61-79] 70 (12/24 0528) Resp:  [17-18] 18 (12/24 0528) BP: (99-108)/(51-57) 108/53 (12/24 0528) SpO2:  [97 %-99 %] 97 % (12/24 0528) Last BM Date: 01/08/16  Intake/Output from previous day: 12/23 0701 - 12/24 0700 In: 1138 [P.O.:360; I.V.:528; IV Piggyback:250] Out: 2600 [Urine:2600] Intake/Output this shift: No intake/output data recorded.  General appearance: alert, cooperative and no distress GI: Mild lower abdominal tenderness without guarding. No erythema Incision/Wound: Right lower quadrant incision packed, dressing clean and dry  Lab Results:   Recent Labs  01/08/16 1959 01/09/16 0433  WBC 12.2* 14.2*  HGB 11.7* 9.6*  HCT 36.0* 31.4*  PLT 574* 507*   BMET  Recent Labs  01/08/16 1959 01/09/16 0433  NA 133* 136  K 4.6 4.3  CL 100* 104  CO2 23 23  GLUCOSE 254* 169*  BUN 20 19  CREATININE 1.41* 1.27*  CALCIUM 10.0 9.1     Studies/Results: Ct Abdomen Pelvis W Contrast  Result Date: 01/08/2016 CLINICAL DATA:  Status post bilateral posterior rectus sheath release with advancement flap some primary abdominal wall closure after colostomy takedown on 12/07/2015. Patient with worsening abdominal pain. EXAM: CT ABDOMEN AND PELVIS WITH CONTRAST TECHNIQUE: Multidetector CT imaging of the abdomen and pelvis was performed using the standard protocol following bolus administration of intravenous contrast. CONTRAST:  118mL ISOVUE-300 IOPAMIDOL (ISOVUE-300) INJECTION 61% COMPARISON:  11/17/2015 FINDINGS: Lower chest:  Subsegmental atelectasis noted left lower lobe. Hepatobiliary: 13 mm low-density lesion posterior right liver is stable in the interval. Liver otherwise unremarkable. Gallbladder decompressed. No intrahepatic or extrahepatic biliary dilation. Pancreas:  No focal mass lesion. No dilatation of the main duct. No intraparenchymal cyst. No peripancreatic edema. Spleen: No splenomegaly. No focal mass lesion. Adrenals/Urinary Tract: Adrenal glands are stable in appearance. No enhancing lesion identified in either kidney. No hydronephrosis. No evidence for hydroureter. The urinary bladder appears normal for the degree of distention. Stomach/Bowel: Stomach is nondistended. No gastric wall thickening. No evidence of outlet obstruction. Duodenum is normally positioned as is the ligament of Treitz. No small bowel wall thickening. No small bowel dilatation. The terminal ileum is normal. The appendix is normal. Colon is foreshortened compatible with prior left colectomy and recent colostomy takedown. Vascular/Lymphatic: There is abdominal aortic atherosclerosis without aneurysm. Scattered small lymph nodes are seen in the hepatoduodenal ligament and retroperitoneal space. No overt lymphadenopathy. Insert no pelvic sidewall lymphadenopathy. Reproductive: The prostate gland and seminal vesicles have normal imaging features. Other: Irregular, multiloculated rim enhancing fluid collection is identified in the anterior abdomen. Dominant component of this collection measures approximately 17.7 by 5.6 by 9.1 cm. This collection is immediately deep to the anterior abdominal and may be extraperitoneal in position. A second smaller collection is seen in the central mesentery (image 57 series 2) measuring approximately 3.8 x 6.1 cm. Gas and fluid is seen in the subcutaneous tissues superficial to the rectus sheath with associated edema/inflammation and appears to communicate with a right paramidline wound. Musculoskeletal: Bone windows reveal no worrisome lytic or sclerotic osseous lesions. IMPRESSION: 1. Large irregular multiloculated abscess in the anterior aspect of the lower anterior abdomen, just deep to the rectus musculature. This could be in the preperitoneal space or potentially  in the anterior peritoneal cavity. Imaging features are compatible with abscess. 2. A second smaller 4 x  6 cm abscess appears to be in the central mesentery. 3. Gas and fluid collections in the anterior subcutaneous fat appear to communicate with a right paramidline wound. 4. No intraperitoneal free fluid and no evidence for contrast extravasation. No features on the current study to overtly suggest anastomotic leak at the distal colonic suture line. I personally discussed the results of this study with Dr. Lucia Gaskins, covering for Dr. Donne Hazel, at approximately 1650 hours on 01/08/2016. Electronically Signed   By: Misty Stanley M.D.   On: 01/08/2016 17:02    Anti-infectives: Anti-infectives    Start     Dose/Rate Route Frequency Ordered Stop   01/09/16 1000  vancomycin (VANCOCIN) IVPB 750 mg/150 ml premix     750 mg 150 mL/hr over 60 Minutes Intravenous Every 12 hours 01/09/16 0019     01/09/16 0600  piperacillin-tazobactam (ZOSYN) IVPB 3.375 g     3.375 g 12.5 mL/hr over 240 Minutes Intravenous Every 8 hours 01/09/16 0019     01/08/16 2330  vancomycin (VANCOCIN) IVPB 1000 mg/200 mL premix  Status:  Discontinued     1,000 mg 200 mL/hr over 60 Minutes Intravenous  Once 01/08/16 2323 01/08/16 2327   01/08/16 2330  piperacillin-tazobactam (ZOSYN) IVPB 3.375 g     3.375 g 12.5 mL/hr over 240 Minutes Intravenous  Once 01/08/16 2324 01/09/16 0256   01/08/16 2330  vancomycin (VANCOCIN) 2,000 mg in sodium chloride 0.9 % 500 mL IVPB     2,000 mg 250 mL/hr over 120 Minutes Intravenous  Once 01/08/16 2327 01/09/16 0256      Assessment/Plan: Intra-abdominal or preperitoneal abscess status post colostomy takedown and extensive ventral hernia repair. Stable on antibiotics. For percutaneous drainage today.    LOS: 1 day    Ciin Brazzel T 01/10/2016

## 2016-01-11 NOTE — Progress Notes (Signed)
Referring Physician(s): Dr Jeanmarie Hubert  Supervising Physician: Markus Daft  Patient Status:  [facility] - In-pt  Chief Complaint:  Intra abdominal abscess 12/24: Postoperative intra-abdominal abscess, initial encounter ([place]) [T81.4XXA, K65.1]       Successful CT ANTERIOR ABDOMINAL ABSCESS DRAIN  150CC EXUDATIVE BLOOD ASPIRATED C/W INFECTED HEMATOMA  No comp Stable Keep to suction bulb Full report in PACS GS/CX SENT       Subjective:  Better today Output 270 cc yesterday Bloody pus in bulb now   Allergies: Contrast media [iodinated diagnostic agents]  Medications: Prior to Admission medications   Medication Sig Start Date End Date Taking? Authorizing Provider  acetaminophen (TYLENOL) 500 MG tablet Take 1,000 mg by mouth every 6 (six) hours as needed (pain).   Yes Historical Provider, MD  digoxin (LANOXIN) 0.125 MG tablet Take 1 tablet (0.125 mg total) by mouth daily. 07/29/15  Yes [name], MD  diltiazem (CARDIZEM CD) 180 MG 24 hr capsule Take 1 capsule (180 mg total) by mouth 2 (two) times daily. 12/03/15  Yes Thompson Grayer, MD  furosemide (LASIX) 40 MG tablet Take 40 mg by mouth daily as needed for fluid.  10/18/15  Yes Historical Provider, MD  lisinopril (PRINIVIL,ZESTRIL) 5 MG tablet Take 5 mg by mouth daily. 10/18/15  Yes Historical Provider, MD  metoprolol (LOPRESSOR) 100 MG tablet TAKE 1 TABLET (100 MG TOTAL) BY MOUTH 2 (TWO) TIMES DAILY. 11/16/15  Yes Sherran Needs, NP  Multiple Vitamin (MULTIVITAMIN WITH MINERALS) TABS tablet Take 1 tablet by mouth daily.   Yes Historical Provider, MD  rivaroxaban (XARELTO) 20 MG TABS tablet Take 1 tablet (20 mg total) by mouth daily with supper. 09/23/15  Yes Thompson Grayer, MD     Vital Signs: BP 107/65 (BP Location: Left Arm)   Pulse (!) 114   Temp 98.4 F (36.9 C) (Oral)   Resp 18   Ht 5\' 10"  (1.778 m)   Wt 255 lb (115.7 kg)   SpO2 97%   BMI 36.59 kg/m   Physical Exam  Constitutional: He is oriented to  person, place, and time.  Abdominal: Soft. There is tenderness.  Musculoskeletal: Normal range of motion.  Neurological: He is alert and oriented to person, place, and time.  Skin: Skin is warm and dry.  Site of drain is clean and dry Tender to touch  Output bloody pus like 270 cc yesterday 20 cc in JP now    Nursing note and vitals reviewed.   Imaging: Ct Abdomen Pelvis W Contrast  Result Date: 01/08/2016 CLINICAL DATA:  Status post bilateral posterior rectus sheath release with advancement flap some primary abdominal wall closure after colostomy takedown on 12/07/2015. Patient with worsening abdominal pain. EXAM: CT ABDOMEN AND PELVIS WITH CONTRAST TECHNIQUE: Multidetector CT imaging of the abdomen and pelvis was performed using the standard protocol following bolus administration of intravenous contrast. CONTRAST:  132mL ISOVUE-300 IOPAMIDOL (ISOVUE-300) INJECTION 61% COMPARISON:  11/17/2015 FINDINGS: Lower chest:  Subsegmental atelectasis noted left lower lobe. Hepatobiliary: 13 mm low-density lesion posterior right liver is stable in the interval. Liver otherwise unremarkable. Gallbladder decompressed. No intrahepatic or extrahepatic biliary dilation. Pancreas: No focal mass lesion. No dilatation of the main duct. No intraparenchymal cyst. No peripancreatic edema. Spleen: No splenomegaly. No focal mass lesion. Adrenals/Urinary Tract: Adrenal glands are stable in appearance. No enhancing lesion identified in either kidney. No hydronephrosis. No evidence for hydroureter. The urinary bladder appears normal for the degree of distention. Stomach/Bowel: Stomach is nondistended. No gastric wall  thickening. No evidence of outlet obstruction. Duodenum is normally positioned as is the ligament of Treitz. No small bowel wall thickening. No small bowel dilatation. The terminal ileum is normal. The appendix is normal. Colon is foreshortened compatible with prior left colectomy and recent colostomy  takedown. Vascular/Lymphatic: There is abdominal aortic atherosclerosis without aneurysm. Scattered small lymph nodes are seen in the hepatoduodenal ligament and retroperitoneal space. No overt lymphadenopathy. Insert no pelvic sidewall lymphadenopathy. Reproductive: The prostate gland and seminal vesicles have normal imaging features. Other: Irregular, multiloculated rim enhancing fluid collection is identified in the anterior abdomen. Dominant component of this collection measures approximately 17.7 by 5.6 by 9.1 cm. This collection is immediately deep to the anterior abdominal and may be extraperitoneal in position. A second smaller collection is seen in the central mesentery (image 57 series 2) measuring approximately 3.8 x 6.1 cm. Gas and fluid is seen in the subcutaneous tissues superficial to the rectus sheath with associated edema/inflammation and appears to communicate with a right paramidline wound. Musculoskeletal: Bone windows reveal no worrisome lytic or sclerotic osseous lesions. IMPRESSION: 1. Large irregular multiloculated abscess in the anterior aspect of the lower anterior abdomen, just deep to the rectus musculature. This could be in the preperitoneal space or potentially in the anterior peritoneal cavity. Imaging features are compatible with abscess. 2. A second smaller 4 x 6 cm abscess appears to be in the central mesentery. 3. Gas and fluid collections in the anterior subcutaneous fat appear to communicate with a right paramidline wound. 4. No intraperitoneal free fluid and no evidence for contrast extravasation. No features on the current study to overtly suggest anastomotic leak at the distal colonic suture line. I personally discussed the results of this study with Dr. Lucia Gaskins, covering for Dr. Donne Hazel, at approximately 1650 hours on 01/08/2016. Electronically Signed   By: Misty Stanley M.D.   On: 01/08/2016 17:02   Ct Image Guided Drainage By Percutaneous Catheter  Result Date:  01/10/2016 INDICATION: ANTERIOR INTRA-ABDOMINAL ABSCESS POSTOPERATIVELY EXAM: CT GUIDED DRAINAGE OF ANTERIOR INTRA-ABDOMINAL ABSCESS MEDICATIONS: The patient is currently admitted to the hospital and receiving intravenous antibiotics. The antibiotics were administered within an appropriate time frame prior to the initiation of the procedure. ANESTHESIA/SEDATION: 1.0 mg IV Versed 75 mcg IV Fentanyl Moderate Sedation Time:  10 MINUTES The patient was continuously monitored during the procedure by the interventional radiology nurse under my direct supervision. COMPLICATIONS: None immediate. TECHNIQUE: Informed written consent was obtained from the patient after a thorough discussion of the procedural risks, benefits and alternatives. All questions were addressed. Maximal Sterile Barrier Technique was utilized including caps, mask, sterile gowns, sterile gloves, sterile drape, hand hygiene and skin antiseptic. A timeout was performed prior to the initiation of the procedure. PROCEDURE: Previous imaging reviewed. Patient positioned supine. Noncontrast localization CT performed. The anterior lower abdominal abscess was localized. Overlying skin was marked in the left lower quadrant. The left lower quadrant was prepped with ChloraPrep in a sterile fashion, and a sterile drape was applied covering the operative field. A sterile gown and sterile gloves were used for the procedure. Local anesthesia was provided with 1% Lidocaine. Under sterile conditions and local anesthesia, CT guidance was utilized to advance an 18 gauge 10 cm access needle from a left anterior oblique approach into the fluid collection. Needle position confirmed with CT. Syringe aspiration yielded a dark bloody fluid compatible with hematoma. Guidewire inserted. Guidewire position confirmed with CT. Tract dilatation performed to insert a 10 [place] drain. Drain catheter position confirmed  with a repeat CT. Syringe aspiration yielded 150 cc bloody exudative  fluid compatible with infected hematoma. Sample sent for Gram stain and culture. Following aspiration the abscess is nearly decompressed. Catheter connected to external suction bulb. Catheter secured with a Prolene suture and a sterile dressing. No immediate complication. Patient tolerated the procedure well. FINDINGS: CT imaging confirms percutaneous needle access from a left lower quadrant approach into the anterior intra-abdominal abscess for drain insertion IMPRESSION: Successful CT-guided anterior abdominal abscess 10 French drain insertion Electronically Signed   By: Jerilynn Mages.  Shick M.D.   On: 01/10/2016 12:21    Labs:  CBC:  Recent Labs  12/12/15 0500 12/13/15 0052 01/08/16 1959 01/09/16 0433  WBC 12.0* 11.1* 12.2* 14.2*  HGB 8.7* 8.7* 11.7* 9.6*  HCT 27.4* 26.4* 36.0* 31.4*  PLT 343 292 574* 507*    COAGS:  Recent Labs  04/13/15 1730  09/30/15 0833 12/07/15 0759 12/08/15 0951 12/08/15 1415 01/09/16 0433 01/10/16 0613  INR 2.62*  < > 1.32 1.12  --   --  1.86 1.21  APTT 49*  --   --   --  30 39*  --   --   < > = values in this interval not displayed.  BMP:  Recent Labs  12/14/15 0703 12/15/15 0416 01/08/16 1959 01/09/16 0433  NA 138 140 133* 136  K 3.6 3.4* 4.6 4.3  CL 99* 104 100* 104  CO2 29 27 23 23   GLUCOSE 123* 108* 254* 169*  BUN 5* 5* 20 19  CALCIUM 8.9 9.0 10.0 9.1  CREATININE 0.97 1.13 1.41* 1.27*  GFRNONAA >60 >60 58* >60  GFRAA >60 >60 >60 >60    LIVER FUNCTION TESTS:  Recent Labs  10/23/15 1149 11/17/15 1446 11/18/15 0709 01/08/16 1959  BILITOT 0.9 0.9 0.3 0.3  AST 15 37 20 21  ALT 18 34 33 23  ALKPHOS 57 112 96 67  PROT 7.2 7.2 6.9 8.1  ALBUMIN 3.6 2.6* 2.4* 3.2*    Assessment and Plan:  Inra abdominal abscess drain placed 12/24 Will follow  Electronically Signed: Tarnesha Ulloa A 01/11/2016, 9:27 AM   I spent a total of 15 Minutes at the the patient's bedside AND on the patient's hospital floor or unit, greater than 50% of  which was counseling/coordinating care for abd abscess drain

## 2016-01-11 NOTE — Progress Notes (Signed)
Subjective: Head drainage catheter placed yesterday. He feels that he is somewhat less sore in his lower abdomen. Also has noticed a lump on his posterior left shoulder since his original surgery.  Objective: Vital signs in last 24 hours: Temp:  [97.5 F (36.4 C)-98.8 F (37.1 C)] 98.4 F (36.9 C) (12/25 0514) Pulse Rate:  [57-114] 114 (12/25 0514) Resp:  [12-20] 18 (12/25 0514) BP: (83-112)/(42-74) 107/65 (12/25 0514) SpO2:  [95 %-100 %] 97 % (12/25 0514) Last BM Date: 01/08/16  Intake/Output from previous day: 12/24 0701 - 12/25 0700 In: 2345 [P.O.:180; I.V.:1460; IV Piggyback:700] Out: [id number] [Urine:1420; Drains:270] Intake/Output this shift: No intake/output data recorded.  General appearance: alert, cooperative and no distress Back:  There is a 2 cm smooth rounded subcutaneous mass without inflammation posterior left shoulder consistent with a lipoma GI: Mild lower abdominal tenderness, improved Incision/Wound: Right lower quadrant incision packed and dressing clean and dry. Drain in place with slightly cloudy bloody drainage  Lab Results:   Recent Labs  01/08/16 1959 01/09/16 0433  WBC 12.2* 14.2*  HGB 11.7* 9.6*  HCT 36.0* 31.4*  PLT 574* 507*   BMET  Recent Labs  01/08/16 1959 01/09/16 0433  NA 133* 136  K 4.6 4.3  CL 100* 104  CO2 23 23  GLUCOSE 254* 169*  BUN 20 19  CREATININE 1.41* 1.27*  CALCIUM 10.0 9.1     Studies/Results: Ct Image Guided Drainage By Percutaneous Catheter  Result Date: 01/10/2016 INDICATION: ANTERIOR INTRA-ABDOMINAL ABSCESS POSTOPERATIVELY EXAM: CT GUIDED DRAINAGE OF ANTERIOR INTRA-ABDOMINAL ABSCESS MEDICATIONS: The patient is currently admitted to the hospital and receiving intravenous antibiotics. The antibiotics were administered within an appropriate time frame prior to the initiation of the procedure. ANESTHESIA/SEDATION: 1.0 mg IV Versed 75 mcg IV Fentanyl Moderate Sedation Time:  10 MINUTES The patient was  continuously monitored during the procedure by the interventional radiology nurse under my direct supervision. COMPLICATIONS: None immediate. TECHNIQUE: Informed written consent was obtained from the patient after a thorough discussion of the procedural risks, benefits and alternatives. All questions were addressed. Maximal Sterile Barrier Technique was utilized including caps, mask, sterile gowns, sterile gloves, sterile drape, hand hygiene and skin antiseptic. A timeout was performed prior to the initiation of the procedure. PROCEDURE: Previous imaging reviewed. Patient positioned supine. Noncontrast localization CT performed. The anterior lower abdominal abscess was localized. Overlying skin was marked in the left lower quadrant. The left lower quadrant was prepped with ChloraPrep in a sterile fashion, and a sterile drape was applied covering the operative field. A sterile gown and sterile gloves were used for the procedure. Local anesthesia was provided with 1% Lidocaine. Under sterile conditions and local anesthesia, CT guidance was utilized to advance an 18 gauge 10 cm access needle from a left anterior oblique approach into the fluid collection. Needle position confirmed with CT. Syringe aspiration yielded a dark bloody fluid compatible with hematoma. Guidewire inserted. Guidewire position confirmed with CT. Tract dilatation performed to insert a 10 [place] drain. Drain catheter position confirmed with a repeat CT. Syringe aspiration yielded 150 cc bloody exudative fluid compatible with infected hematoma. Sample sent for Gram stain and culture. Following aspiration the abscess is nearly decompressed. Catheter connected to external suction bulb. Catheter secured with a Prolene suture and a sterile dressing. No immediate complication. Patient tolerated the procedure well. FINDINGS: CT imaging confirms percutaneous needle access from a left lower quadrant approach into the anterior intra-abdominal abscess for  drain insertion IMPRESSION: Successful CT-guided anterior abdominal abscess  10 [place] drain insertion Electronically Signed   By: Jerilynn Mages.  Shick M.D.   On: 01/10/2016 12:21    Anti-infectives: Anti-infectives    Start     Dose/Rate Route Frequency Ordered Stop   01/09/16 1000  vancomycin (VANCOCIN) IVPB 750 mg/150 ml premix     750 mg 150 mL/hr over 60 Minutes Intravenous Every 12 hours 01/09/16 0019     01/09/16 0600  piperacillin-tazobactam (ZOSYN) IVPB 3.375 g     3.375 g 12.5 mL/hr over 240 Minutes Intravenous Every 8 hours 01/09/16 0019     01/08/16 2330  vancomycin (VANCOCIN) IVPB 1000 mg/200 mL premix  Status:  Discontinued     1,000 mg 200 mL/hr over 60 Minutes Intravenous  Once 01/08/16 2323 01/08/16 2327   01/08/16 2330  piperacillin-tazobactam (ZOSYN) IVPB 3.375 g     3.375 g 12.5 mL/hr over 240 Minutes Intravenous  Once 01/08/16 2324 01/09/16 0256   01/08/16 2330  vancomycin (VANCOCIN) 2,000 mg in sodium chloride 0.9 % 500 mL IVPB     2,000 mg 250 mL/hr over 120 Minutes Intravenous  Once 01/08/16 2327 01/09/16 0256      Assessment/Plan: Status post colostomy takedown and repair of ventral incisional hernia with lower abdominal abscess. Partially draining through old stoma site.  His post-percutaneous drainage. Clinically appears to be improving. Continue drainage and antibiotics. Check labs tomorrow. He has a apparent lipoma on his left shoulder which is not an acute issue and could be removed electively in the future. Discussed with patient.    LOS: 2 days    Karsynn Deweese T 01/11/2016

## 2016-01-11 NOTE — Progress Notes (Signed)
Pt agitated stating that his needs are not being met. He states that a MD can't come look at his shoulder and that he can't get him pain medications when he wants it. I explained that a note is place in front of the chart about his shoulder and I have been giving you ice for the pain. I also explained that a MD will not come to floor unless it is an emergency and that this is not an emergency. And that medications are given not when you want them but within the time limits that MD orders them.Pt refused to have his drsg changed or his drain flushed. He called his mother and I spoke with her and explained that when MD rounds this morning I will report to day shift RN to inform the MD about his shoulder and that medications are given when the MD orders them not anytime the patient wants them. She stated she understood and had no other questions or concerns.

## 2016-01-12 LAB — VANCOMYCIN, TROUGH: Vancomycin Tr: 14 ug/mL — ABNORMAL LOW (ref 15–20)

## 2016-01-12 LAB — CBC
HEMATOCRIT: 34.3 % — AB (ref 39.0–52.0)
Hemoglobin: 11 g/dL — ABNORMAL LOW (ref 13.0–17.0)
MCH: 28.2 pg (ref 26.0–34.0)
MCHC: 32.1 g/dL (ref 30.0–36.0)
MCV: 87.9 fL (ref 78.0–100.0)
Platelets: 426 10*3/uL — ABNORMAL HIGH (ref 150–400)
RBC: 3.9 MIL/uL — ABNORMAL LOW (ref 4.22–5.81)
RDW: 15.7 % — AB (ref 11.5–15.5)
WBC: 9.2 10*3/uL (ref 4.0–10.5)

## 2016-01-12 MED ORDER — VANCOMYCIN HCL IN DEXTROSE 1-5 GM/200ML-% IV SOLN
1000.0000 mg | Freq: Two times a day (BID) | INTRAVENOUS | Status: DC
  Filled 2016-01-12: qty 200

## 2016-01-12 MED ORDER — SACCHAROMYCES BOULARDII 250 MG PO CAPS
250.0000 mg | ORAL_CAPSULE | Freq: Two times a day (BID) | ORAL | Status: DC
  Administered 2016-01-12 – 2016-01-13 (×3): 250 mg via ORAL
  Filled 2016-01-12 (×3): qty 1

## 2016-01-12 NOTE — Progress Notes (Signed)
Subjective: Open wound looks good, drainage from IR drain is still bloody and thick colored fluid.  Tolerating diet and no issue with BM.    Objective: Vital signs in last 24 hours: Temp:  [97.6 F (36.4 C)-99 F (37.2 C)] 98.6 F (37 C) (12/26 0527) Pulse Rate:  [64-104] 77 (12/26 0527) Resp:  [17-18] 18 (12/26 0527) BP: (97-108)/(54-80) 103/54 (12/26 0527) SpO2:  [95 %-100 %] 95 % (12/26 0527) Last BM Date: 01/11/16 PO 600 Drains:  80 BM x 2 Tm 99.3, VSS WBC is down to 9.2, H/H is up some  Intake/Output from previous day: 12/25 0701 - 12/26 0700 In: [id number] [P.O.:600; I.V.:360; IV Piggyback:450] Out: 2430 [Urine:2350; Drains:80] Intake/Output this shift: Total I/O In: 240 [P.O.:240] Out: 250 [Urine:250]  General appearance: alert, cooperative and no distress Resp: clear to auscultation bilaterally GI: soft, non-tender; bowel sounds normal; no masses,  no organomegaly and open wound ostomy site looks fine and has minimal drainage.  IR drain as noted above.  Lab Results:   Recent Labs  01/12/16 0605  WBC 9.2  HGB 11.0*  HCT 34.3*  PLT 426*    BMET No results for input(s): NA, K, CL, CO2, GLUCOSE, BUN, CREATININE, CALCIUM in the last 72 hours. PT/INR  Recent Labs  01/10/16 0613  LABPROT 15.4*  INR 1.21     Recent Labs Lab 01/08/16 1959  AST 21  ALT 23  ALKPHOS 67  BILITOT 0.3  PROT 8.1  ALBUMIN 3.2*     Lipase     Component Value Date/Time   LIPASE 25 10/23/2015 1149     Studies/Results: Ct Image Guided Drainage By Percutaneous Catheter  Result Date: 01/10/2016 INDICATION: ANTERIOR INTRA-ABDOMINAL ABSCESS POSTOPERATIVELY EXAM: CT GUIDED DRAINAGE OF ANTERIOR INTRA-ABDOMINAL ABSCESS MEDICATIONS: The patient is currently admitted to the hospital and receiving intravenous antibiotics. The antibiotics were administered within an appropriate time frame prior to the initiation of the procedure. ANESTHESIA/SEDATION: 1.0 mg IV Versed 75 mcg IV  Fentanyl Moderate Sedation Time:  10 MINUTES The patient was continuously monitored during the procedure by the interventional radiology nurse under my direct supervision. COMPLICATIONS: None immediate. TECHNIQUE: Informed written consent was obtained from the patient after a thorough discussion of the procedural risks, benefits and alternatives. All questions were addressed. Maximal Sterile Barrier Technique was utilized including caps, mask, sterile gowns, sterile gloves, sterile drape, hand hygiene and skin antiseptic. A timeout was performed prior to the initiation of the procedure. PROCEDURE: Previous imaging reviewed. Patient positioned supine. Noncontrast localization CT performed. The anterior lower abdominal abscess was localized. Overlying skin was marked in the left lower quadrant. The left lower quadrant was prepped with ChloraPrep in a sterile fashion, and a sterile drape was applied covering the operative field. A sterile gown and sterile gloves were used for the procedure. Local anesthesia was provided with 1% Lidocaine. Under sterile conditions and local anesthesia, CT guidance was utilized to advance an 18 gauge 10 cm access needle from a left anterior oblique approach into the fluid collection. Needle position confirmed with CT. Syringe aspiration yielded a dark bloody fluid compatible with hematoma. Guidewire inserted. Guidewire position confirmed with CT. Tract dilatation performed to insert a 10 [place] drain. Drain catheter position confirmed with a repeat CT. Syringe aspiration yielded 150 cc bloody exudative fluid compatible with infected hematoma. Sample sent for Gram stain and culture. Following aspiration the abscess is nearly decompressed. Catheter connected to external suction bulb. Catheter secured with a Prolene suture and a sterile dressing. No  immediate complication. Patient tolerated the procedure well. FINDINGS: CT imaging confirms percutaneous needle access from a left lower  quadrant approach into the anterior intra-abdominal abscess for drain insertion IMPRESSION: Successful CT-guided anterior abdominal abscess 10 French drain insertion Electronically Signed   By: Jerilynn Mages.  Shick M.D.   On: 01/10/2016 12:21    Medications: . digoxin  0.125 mg Oral Daily  . diltiazem  180 mg Oral BID  . docusate sodium  100 mg Oral BID  . enoxaparin (LOVENOX) injection  120 mg Subcutaneous Q12H  . furosemide  40 mg Oral QODAY  . lisinopril  5 mg Oral Daily  . metoprolol  100 mg Oral BID  . multivitamin with minerals  1 tablet Oral Daily  . piperacillin-tazobactam (ZOSYN)  IV  3.375 g Intravenous Q8H  . vancomycin  750 mg Intravenous Q12H   . 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 Elmo Putt Thomas/Dr. Donne Hazel - discharged 12/15/15: AT Readmitted 01/08/16 with abdominal abscess after colostomy take down and complex hernia repair S/p anterior abdominal would abscess drain placement by IR;  150 cc of exudative blood aspiratated consistent with infected hematoma Hx of C diff colitis History of atrial fibrillation with chronic coagulation Cardiomyopathy with a history of chronic systolic heart failure Obesity Situational depression and anxiety History tobacco use FEN:  IV  fluids/Cardiac diet ID: Day 5 Zosyn and vancomycin DVT: Lovenox 120 mg q12h  Plan:  Continue antibiotics and add probiotic, await cultures.     LOS: 3 days    Adalind Weitz 01/12/2016 [phone]

## 2016-01-12 NOTE — Progress Notes (Signed)
Pharmacy Antibiotic Note  RENOLD SUITE is a 50 y.o. male admitted on 01/08/2016 with abdominal abscess/wound infection.  Pharmacy has been consulted for Vancomycin/Zosyn dosing.   A Vancomycin trough this evening resulted as slightly SUBtherapeutic (VT 14 mcg/ml, goal of 15-20 mcg/ml). No recent SCr - was 1.27 on 12/23 AM, estimated CrCl~90 ml/min.   Plan: 1. Adjust Vancomycin to 1g IV every 12 hours 2. Continue Zosyn 3.375g IV every 8 hours 3. Will continue to follow renal function, culture results, LOT, and antibiotic de-escalation plans   Height: 5\' 10"  (177.8 cm) Weight: 255 lb (115.7 kg) IBW/kg (Calculated) : 73  Temp (24hrs), Avg:98.5 F (36.9 C), Min:98.3 F (36.8 C), Max:98.6 F (37 C)   Recent Labs Lab 01/08/16 1959 01/08/16 2335 01/09/16 0222 01/09/16 0433 01/12/16 0605 01/12/16 2059  WBC 12.2*  --   --  14.2* 9.2  --   CREATININE 1.41*  --   --  1.27*  --   --   LATICACIDVEN  --  2.93* 1.21  --   --   --   VANCOTROUGH  --   --   --   --   --  14*    Estimated Creatinine Clearance: 90.7 mL/min (by C-G formula based on SCr of 1.27 mg/dL (H)).    Allergies  Allergen Reactions  . Contrast Media [Iodinated Diagnostic Agents] Rash    a diffuse macular rash after CTA chest  Pt was premedicated with 125mg  IV Solumedrol, 50mg  IV Benadryl 1 hr prior to CTexam, and tolerated procedure without any difficulties. 11/28/11 Also same pre med protocol observed on 02/22/15 and pt tolerated the procedure well   Antimicrobials this admission:  Vancomycin 12/23 >>  Zosyn 12/23 >>   Dose adjustments this admission:  12/26 VT 14 mcg/ml on 750 mg/12h >> increase to 1g/12h  Microbiology results:  12/24  abscess  abundant diphtheroids (corynebacterium)-    Thank you for allowing pharmacy to be a part of this patient's care.  Alycia Rossetti, PharmD, BCPS Clinical Pharmacist Pager: [phone] 01/12/2016 9:55 PM

## 2016-01-13 LAB — BASIC METABOLIC PANEL
Anion gap: 11 (ref 5–15)
BUN: 9 mg/dL (ref 6–20)
CO2: 24 mmol/L (ref 22–32)
CREATININE: 1.05 mg/dL (ref 0.61–1.24)
Calcium: 8.9 mg/dL (ref 8.9–10.3)
Chloride: 99 mmol/L — ABNORMAL LOW (ref 101–111)
GFR calc Af Amer: >60 mL/min (ref >60)
GLUCOSE: 99 mg/dL (ref 65–99)
POTASSIUM: 3.7 mmol/L (ref 3.5–5.1)
SODIUM: 134 mmol/L — AB (ref 135–145)

## 2016-01-13 LAB — CBC
HCT: 33.2 % — ABNORMAL LOW (ref 39.0–52.0)
Hemoglobin: 10.5 g/dL — ABNORMAL LOW (ref 13.0–17.0)
MCH: 27.9 pg (ref 26.0–34.0)
MCHC: 31.6 g/dL (ref 30.0–36.0)
MCV: 88.1 fL (ref 78.0–100.0)
PLATELETS: 465 10*3/uL — AB (ref 150–400)
RBC: 3.77 MIL/uL — AB (ref 4.22–5.81)
RDW: 15.3 % (ref 11.5–15.5)
WBC: 9 10*3/uL (ref 4.0–10.5)

## 2016-01-13 MED ORDER — DILTIAZEM HCL ER COATED BEADS 180 MG PO CP24
180.0000 mg | ORAL_CAPSULE | Freq: Every day | ORAL | Status: DC
  Administered 2016-01-13: 180 mg via ORAL
  Filled 2016-01-13: qty 1

## 2016-01-13 MED ORDER — OXYCODONE HCL 5 MG PO TABS: 5.0000 mg | ORAL_TABLET | ORAL | 0 refills | Status: DC | PRN

## 2016-01-13 MED ORDER — RIVAROXABAN 20 MG PO TABS
20.0000 mg | ORAL_TABLET | Freq: Every day | ORAL | Status: DC
  Filled 2016-01-13: qty 1

## 2016-01-13 MED ORDER — AMOXICILLIN-POT CLAVULANATE 875-125 MG PO TABS: 1.0000 | ORAL_TABLET | Freq: Two times a day (BID) | ORAL | 1 refills | Status: DC

## 2016-01-13 MED ORDER — SACCHAROMYCES BOULARDII 250 MG PO CAPS: ORAL_CAPSULE | ORAL | Status: DC

## 2016-01-13 MED ORDER — AMOXICILLIN-POT CLAVULANATE 875-125 MG PO TABS
1.0000 | ORAL_TABLET | Freq: Two times a day (BID) | ORAL | Status: DC
  Administered 2016-01-13: 1 via ORAL
  Filled 2016-01-13: qty 1

## 2016-01-13 NOTE — Progress Notes (Addendum)
Pt. Wanted to take all blood pressure medications even with BP being 92/61. Pt. Refused Xarelto. States he'll take it at home tonight at 1800.

## 2016-01-13 NOTE — Progress Notes (Signed)
Subjective: He continues to ask about going home his son is doing dressing changes. The open colostomy site tracks about 10 cm laterally toward the IR drain on the left.  I could get a whole open sponge into that portion of the wound.  I place a second sponge on in the remainder of the open site.  The drainage from the IR drain appears clearer this AM to me.  No erythema around the open colostomy or the drain.    Objective: Vital signs in last 24 hours: Temp:  [97.5 F (36.4 C)-98.5 F (36.9 C)] 97.5 F (36.4 C) (12/27 0622) Pulse Rate:  [66-97] 66 (12/27 0622) Resp:  [16-18] 16 (12/27 0622) BP: (90-110)/(48-64) 90/48 (12/27 0622) SpO2:  [97 %-99 %] 99 % (12/27 0622) Last BM Date: 01/11/16 1020 PO Urine 1075 Drain 45  Afebrile, VSS BP on low end of normal - Lopressor 100 mg BID and Cardizem 180 BID BMP and CBC look fine, WBC is normal  Intake/Output from previous day: 12/26 0701 - 12/27 0700 In: 2765.7 [P.O.:1020; I.V.:1480.7; IV Piggyback:250] Out: 1120 [Urine:1075; Drains:45] Intake/Output this shift: No intake/output data recorded.  General appearance: alert, cooperative and no distress Resp: clear to auscultation bilaterally GI: soft, nontender, open wound looks clean, it goes to about 10 cm laterally, and has some purulence at the end of it.  Drain is clearer this AM.    Lab Results:   Recent Labs  01/12/16 0605 01/13/16 0424  WBC 9.2 9.0  HGB 11.0* 10.5*  HCT 34.3* 33.2*  PLT 426* 465*    BMET  Recent Labs  01/13/16 0424  NA 134*  K 3.7  CL 99*  CO2 24  GLUCOSE 99  BUN 9  CREATININE 1.05  CALCIUM 8.9   PT/INR No results for input(s): LABPROT, INR in the last 72 hours.   Recent Labs Lab 01/08/16 1959  AST 21  ALT 23  ALKPHOS 67  BILITOT 0.3  PROT 8.1  ALBUMIN 3.2*     Lipase     Component Value Date/Time   LIPASE 25 10/23/2015 1149     Studies/Results: No results found.  Medications: . digoxin  0.125 mg Oral Daily  .  diltiazem  180 mg Oral BID  . docusate sodium  100 mg Oral BID  . enoxaparin (LOVENOX) injection  120 mg Subcutaneous Q12H  . furosemide  40 mg Oral QODAY  . lisinopril  5 mg Oral Daily  . metoprolol  100 mg Oral BID  . multivitamin with minerals  1 tablet Oral Daily  . piperacillin-tazobactam (ZOSYN)  IV  3.375 g Intravenous Q8H  . saccharomyces boulardii  250 mg Oral BID  . 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 Elmo Putt Thomas/Dr. 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 Elmo Putt Thomas/Dr. Donne Hazel - discharged 12/15/15: AT Readmitted 01/08/16 with abdominal abscess after colostomy take down and complex hernia repair S/p anterior abdominal would abscess drain placement by IR;  150 cc of exudative blood aspiratated consistent with infected hematoma Hx of C diff  colitis History of atrial fibrillation with chronic coagulation Cardiomyopathy with a history of chronic systolic heart failure Hypertension with decrease in BP here - decrease Diltiazem Obesity Situational depression and anxiety History tobacco use FEN:  IV fluids/Cardiac diet ID: Day 5 Zosyn and vancomycin completed 01/12/16  =>> start Augmentin 01/13/16 DVT: Lovenox 120 mg q12h -   Plan: Discussed with pharmacy and they agree ampicillin would cover this Corynebacterium.  Augmentin would also give [place] some additional GM negative coverage.  He does not want help at home.  Will start the Augmentin, ask IR to start setting up OP repeat CT in drain clinic, they estimate 7-10 days. Restart Xarelto. Stop the Lovenox  Follow up with Dr. Donne Hazel.       LOS: 4 days    Marylon Verno 01/13/2016 [phone]

## 2016-01-13 NOTE — Progress Notes (Signed)
Patient ID: William Fischer, male   DOB: 15-Nov-1965, 50 y.o.   MRN: [id number] Patient is s/p intra-abdominal drain placement 12/24.  He will be contacted by IR schedulers for follow-up CT drain injection in outpatient clinic after discharge in 7-10 days.   Brynda Greathouse, MS RD PA-C 01/13/16 9:18 AM

## 2016-01-13 NOTE — Progress Notes (Signed)
D/C papers gone over with pt. Drain teaching done. Specimen cup and output recording sheet given to pt. Told pt. To measure and record all drain output on sheet. Pt. States no questions/complaints. IV taken out. Prescriptions given to pt. Pt. D/c'd successfully via w/c with son.

## 2016-01-13 NOTE — Discharge Instructions (Signed)
Percutaneous Abscess Drain, Care After Refer to this sheet in the next few weeks. These instructions provide you with information on caring for yourself after your procedure. Your health care provider may also give you more specific instructions. Your treatment has been planned according to current medical practices, but problems sometimes occur. Call your health care provider if you have any problems or questions after your procedure. WHAT TO EXPECT AFTER THE PROCEDURE After your procedure, it is typical to have the following:   A small amount of discomfort in the area where the drainage tube was placed.  A small amount of bruising around the area where the drainage tube was placed.  Sleepiness and fatigue for the rest of the day from the medicines used. HOME CARE INSTRUCTIONS  Rest at home for 1-2 days following your procedure or as directed by your health care provider.  If you go home right after the procedure, plan to have someone with you for 24 hours.  Do not take a bathor shower for 24 hours after your procedure.  Take medicines only as directed by your health care provider. Ask your health care provider when you can resume taking any normal medicines.  Change bandages (dressings) as directed.   You may be told to record the amount of drainage from the bag every time you empty it. Follow your health care provider's directions for emptying the bag. Write down the amount of drainage, the date, and the time you emptied it.  Call your health care provider when the drain is putting out less than 10 mL of drainage per day for 2-3 days in a row or as directed by your health care provider.  Follow your health care provider's instructions for cleaning the drainage tube. You may need to clean the tube every day so that it does not clog. SEEK MEDICAL CARE IF:  You have increased bleeding (more than a small spot) from the site where the drainage tube was placed.  You have redness,  swelling, or increasing pain around the site where the drainage tube was placed.  You notice a discharge or bad smell coming from the site where the drainage tube was placed.  You have a fever or chills.  You have pain that is not helped by medicine.  SEEK IMMEDIATE MEDICAL CARE IF:  There is leakage around the drainage tube.  The drainage tube pulls out.  You suddenly stop having drainage from the tube.  You suddenly have blood in the drainage fluid.  You become dizzy or faint.  You develop a rash.   You have nausea or vomiting.  You have difficulty breathing, feel short of breath, or feel faint.   You develop chest pain.  You have problems with your speech or vision.  You have trouble balancing or moving your arms or legs. This information is not intended to replace advice given to you by your health care provider. Make sure you discuss any questions you have with your health care provider. Document Released: 05/20/2013 Document Revised: 10/22/2013 Document Reviewed: 05/20/2013 Elsevier Interactive Patient Education  2017 [employer].  Mechanical Wound Debridement Be sure you pack the open wound all the way to the end which goes to the left.   Introduction Mechanical wound debridement is a treatment to remove dead tissue from a wound. This helps the wound heal. The treatment involves cleaning the wound (irrigation) and using a pad or gauze (dressing) to remove dead tissue and debris from the wound. There are different  types of mechanical wound debridement. Depending on the wound, you may need to repeat this procedure or change to another form of debridement as your wound starts to heal. Tell a health care provider about:  Any allergies you have.  All medicines you are taking, including vitamins, herbs, eye drops, creams, and over-the-counter medicines.  Any blood disorders you have.  Any medical conditions you have, including any conditions that:  Cause a  significant decrease in blood circulation to the part of the body where the wound is, such as peripheral vascular disease.  Compromise your defense (immune) system or white blood count.  Any surgeries you have had.  Whether you are pregnant or may be pregnant. What are the risks? Generally, this is a safe procedure. However, problems may occur, including:  Infection.  Bleeding.  Damage to healthy tissue in and around your wound.  Soreness or pain.  Failure of the wound to heal.  Scarring. What happens before the procedure? You may be given antibiotic medicine to help prevent infection. What happens during the procedure?  Your health care provider may apply a numbing medicine (topical anesthetic) to the wound.  Your health care provider will irrigate your wound with a germ-free (sterile), salt-water (saline) solution. This removes debris, bacteria, and dead tissue.  Depending on what type of mechanical wound debridement you are having, your health care provider may do one of the following:  Put a dressing on your wound. You may have dry gauze pad placed into the wound. Your health care provider will remove the gauze after the wound is dry. Any dead tissue and debris that has dried into the gauze will be lifted out of the wound (wet-to-dry debridement).  Use a type of pad (monofilament fiber debridement pad). This pad has a fluffy surface on one side that picks up dead tissue and debris from your wound. Your health care provider wets the pad and wipes it over your wound for several minutes.  Irrigate your wound with a pressurized stream of solution such as saline or water.  Once your health care provider is finished, he or she may apply a light dressing to your wound. The procedure may vary among health care providers and hospitals. What happens after the procedure?  You may receive medicine for pain.  You will continue to receive antibiotic medicine if it was started before  your procedure. This information is not intended to replace advice given to you by your health care provider. Make sure you discuss any questions you have with your health care provider. Document Released: 09/24/2014 Document Revised: 06/11/2015 Document Reviewed: 05/14/2014  2017 Elsevier  How to Take Your Blood Pressure HOW DO I GET A BLOOD PRESSURE MACHINE? Take your blood pressure 3-4 times a day and home and call Dr. Rayann Heman for follow up.      You can buy an electronic home blood pressure machine at your local pharmacy. Insurance will sometimes cover the cost if you have a prescription.  Ask your doctor what type of machine is best for you. There are different machines for your arm and your wrist.  If you decide to buy a machine to check your blood pressure on your arm, first check the size of your arm so you can buy the right size cuff. To check the size of your arm:   Use a measuring tape that shows both inches and centimeters.   Wrap the measuring tape around the upper-middle part of your arm. You may need someone  to help you measure.   Write down your arm measurement in both inches and centimeters.   To measure your blood pressure correctly, it is important to have the right size cuff.   If your arm is up to 13 inches (up to 34 centimeters), get an adult cuff size.  If your arm is 13 to 17 inches (35 to 44 centimeters), get a large adult cuff size.    If your arm is 17 to 20 inches (45 to 52 centimeters), get an adult thigh cuff.  WHAT DO THE NUMBERS MEAN?   There are two numbers that make up your blood pressure. For example: 120/80.  The first number (120 in our example) is called the "systolic pressure." It is a measure of the pressure in your blood vessels when your heart is pumping blood.  The second number (80 in our example) is called the "diastolic pressure." It is a measure of the pressure in your blood vessels when your heart is resting between  beats.  Your doctor will tell you what your blood pressure should be. WHAT SHOULD I DO BEFORE I CHECK MY BLOOD PRESSURE?   Try to rest or relax for at least 30 minutes before you check your blood pressure.  Do not smoke.  Do not have any drinks with caffeine, such as:  Soda.  Coffee.  Tea.  Check your blood pressure in a quiet room.  Sit down and stretch out your arm on a table. Keep your arm at about the level of your heart. Let your arm relax.  Make sure that your legs are not crossed. HOW DO I CHECK MY BLOOD PRESSURE?  Follow the directions that came with your machine.  Make sure you remove any tight-fitting clothing from your arm or wrist. Wrap the cuff around your upper arm or wrist. You should be able to fit a finger between the cuff and your arm. If you cannot fit a finger between the cuff and your arm, it is too tight and should be removed and rewrapped.  Some units require you to manually pump up the arm cuff.  Automatic units inflate the cuff when you press a button.  Cuff deflation is automatic in both models.  After the cuff is inflated, the unit measures your blood pressure and pulse. The readings are shown on a monitor. Hold still and breathe normally while the cuff is inflated.  Getting a reading takes less than a minute.  Some models store readings in a memory. Some provide a printout of readings. If your machine does not store your readings, keep a written record.  Take readings with you to your next visit with your doctor. This information is not intended to replace advice given to you by your health care provider. Make sure you discuss any questions you have with your health care provider. Document Released: 12/17/2007 Document Revised: 01/24/2014 Document Reviewed: 02/28/2013 Elsevier Interactive Patient Education  2017 [employer].

## 2016-01-14 NOTE — Discharge Summary (Signed)
Physician Discharge Summary  Patient ID: William Fischer MRN: [id number] DOB/AGE: 48-08-1965 48 y.o.  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 Elmo Putt Thomas/Dr. 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 Elmo Putt Thomas/Dr. Donne Hazel - discharged 12/15/15 Hx of C diff colitis History of atrial fibrillation with chronic coagulation Cardiomyopathy with a history of chronic systolic heart failure Hypertension  Obesity Situational depression and anxiety History tobacco use   Principal Problem:   Abdominal abscess ([place]) Active Problems:   Diverticulitis of colon with perforation   NICM (nonischemic cardiomyopathy) ([place])   Intra-abdominal infection   Depression with  anxiety   Chronic systolic heart failure (HCC)   AKI (acute kidney injury) ([place])   Chronic atrial fibrillation (HCC)   Anticoagulated by anticoagulation treatment   Morbid obesity ([place])   PROCEDURES: S/p anterior abdominal would abscess drain placement by IR; 150 cc of exudative blood aspiratated consistent with infected hematoma, 01/10/16, IR, Dr. Jerilynn Mages. [facility] Course:  Pt is a 50 yo M who is s/p colostomy takedown and repair of incisional hernia 12/07/2015.  Original pathology was ischemic colon from atrial fibrillation.  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  The takedown was performed by Dr. Marcello Moores and the abdominal wall closure by Dr.Wakefield.  Hernia repair consisted of bilateral rectus sheath release with advancement flaps and onlay phasix mesh.  He was discharged 11/28.  He was maintained on anticoagulation while in the hospital and had a bit of an ileus post operatively.   He saw cardiology last week and was doing well.  He saw Drs. Donne Hazel and [place] last week in the clinic.  He had some purulent fluid draining from his colostomy wound but no fevers.  Upon follow up this week on 12/19 (three days prior to admission) he was seen again by Dr. Donne Hazel and scan was ordered because drainage was copious and continued.  This occurred this afternoon and he was seen to have a large abdominal abscess.  One is relatively superficial under the fascia, but the other one is deeper.  ? Communication between the abscesses.  He reports chills at this point, but has not had fevers.  He continues to have no n/v.  He complains of mild mid/lower right abdominal pain.  Pt was seen and admitted by Dr. Barry Dienes on 01/08/16. Xarelto was placed on hold, and he was placed on Lovenox.  IR was consulted and he was placed on both Vancomycin and Zosyn  IV.  He was seen by IR and new drain placed on 01/10/16 by Dr. Annamaria Boots, IR. He drained what appeared to be 150CC  EXUDATIVE BLOOD ASPIRATED C/W INFECTED HEMATOMA.  His open wound was packed wet to dry BID. Drainage was a cloudy bloody appearing fluid.  He also continue to drain some thru the old open colostomy site. By 01/13/16 drainage was still bloody, but much clearer.  He was anxious to go home.  After consulting with pharmacy it was agreed to place him on Augmentin.  He was discharged home with follow up with Dr. Donne Hazel and IR to follow up on the drain.    Culture grew: Specimen Description ABSCESS   Special Requests Normal   Gram Stain ABUNDANT WBC PRESENT, PREDOMINANTLY PMN MODERATE GRAM POSITIVE RODS RARE GRAM POSITIVE COCCI IN PAIRS   Culture ABUNDANT DIPHTHEROIDS(CORYNEBACTERIUM SPECIES)  Standardized susceptibility testing for this organism is not available.  HOLDING FOR POSSIBLE ANAEROBE      Discussed with pharmacy and we agreed Augmentin would be the best option for coverage at this point.  Pt was anxious to go home.          Disposition: 01-Home or Self Care   Allergies as of 01/13/2016      Reactions   Contrast Media [iodinated Diagnostic Agents] Rash   a diffuse macular rash after CTA chest  Pt was premedicated with 125mg  IV Solumedrol, 50mg  IV Benadryl 1 hr prior to CTexam, and tolerated procedure without any difficulties. 11/28/11 Also same pre med protocol observed on 02/22/15 and pt tolerated the procedure well      Medication List    TAKE these medications   acetaminophen 500 MG tablet Commonly known as:  TYLENOL Take 1,000 mg by mouth every 6 (six) hours as needed (pain).   amoxicillin-clavulanate 875-125 MG tablet Commonly known as:  AUGMENTIN Take 1 tablet by mouth every 12 (twelve) hours.   digoxin 0.125 MG tablet Commonly known as:  LANOXIN Take 1 tablet (0.125 mg total) by mouth daily.   diltiazem 180 MG 24 hr capsule Commonly known as:  CARDIZEM CD Take 1 capsule (180 mg total) by mouth 2 (two) times daily.   furosemide 40 MG tablet Commonly known  as:  LASIX Take 40 mg by mouth daily as needed for fluid.   lisinopril 5 MG tablet Commonly known as:  PRINIVIL,ZESTRIL Take 5 mg by mouth daily.   metoprolol 100 MG tablet Commonly known as:  LOPRESSOR TAKE 1 TABLET (100 MG TOTAL) BY MOUTH 2 (TWO) TIMES DAILY.   multivitamin with minerals Tabs tablet Take 1 tablet by mouth daily.   oxyCODONE 5 MG immediate release tablet Commonly known as:  Oxy IR/ROXICODONE Take 1-2 tablets (5-10 mg total) by mouth every 4 (four) hours as needed for moderate pain.   rivaroxaban 20 MG Tabs tablet Commonly known as:  XARELTO Take 1 tablet (20 mg total) by mouth daily with supper.   saccharomyces boulardii 250 MG capsule Commonly known as:  FLORASTOR You can buy this over the counter, I would use it as long as your on antibiotics.      Follow-up Information    Julious Oka, MD Follow up.   Specialty:  Internal Medicine Why:  Call and have her check your blood pressure.  It has been borderline low and you are on 2 medicines for this currently. Contact information: [place] [address] [phone]        Rolm Bookbinder, MD Follow up on 01/25/2016.  Specialty:  General Surgery Why:  Your appointment is at 11:20 AM, be at the office 30 minutes early for check in.   Contact information: [address] [place] [address] [phone]        Thompson Grayer, MD Follow up.   Specialty:  Cardiology Why:  call and let him follow up on your blood pressure/heart medicines. Contact information: [address] [place] [address] [phone]        [facility] Follow up.   Why:  They will set you up with follow up appointment to look at your drain in 7-10 days.  Call Dr. Cristal Generous office if you do not hear from them by next week.           SignedEarnstine Regal 01/17/2016, 10:57 AM

## 2016-01-15 LAB — AEROBIC/ANAEROBIC CULTURE W GRAM STAIN (SURGICAL/DEEP WOUND): Special Requests: NORMAL

## 2016-01-17 ENCOUNTER — Encounter (HOSPITAL_COMMUNITY): Payer: Self-pay | Admitting: General Surgery

## 2016-01-17 DIAGNOSIS — Z7901 Long term (current) use of anticoagulants: Secondary | ICD-10-CM

## 2016-01-17 NOTE — Assessment & Plan Note (Signed)
Acute event 01/17/16

## 2016-01-19 ENCOUNTER — Other Ambulatory Visit: Payer: Self-pay | Admitting: General Surgery

## 2016-01-19 ENCOUNTER — Telehealth: Payer: Self-pay | Admitting: Radiology

## 2016-01-19 DIAGNOSIS — B999 Unspecified infectious disease: Secondary | ICD-10-CM

## 2016-01-19 NOTE — Telephone Encounter (Signed)
Attempt to contact patient x 2 w/ app't info for abscess drain follow up (scheduled for 01/27/2016).  Will try again later.  Denene Alamillo Riki Rusk, RN 01/19/2016 10:47 AM

## 2016-01-21 ENCOUNTER — Encounter: Payer: Self-pay | Admitting: Interventional Radiology

## 2016-01-27 ENCOUNTER — Ambulatory Visit
Admission: RE | Admit: 2016-01-27 | Discharge: 2016-01-27 | Disposition: A | Discharge status: Home / Self Care | Payer: BLUE CROSS/BLUE SHIELD | Source: Ambulatory Visit | Attending: General Surgery | Admitting: General Surgery

## 2016-01-27 ENCOUNTER — Encounter: Payer: Self-pay | Admitting: Radiology

## 2016-01-27 DIAGNOSIS — K651 Peritoneal abscess: Secondary | ICD-10-CM | POA: Diagnosis not present

## 2016-01-27 DIAGNOSIS — B999 Unspecified infectious disease: Secondary | ICD-10-CM

## 2016-01-27 HISTORY — PX: IR GENERIC HISTORICAL: IMG1180011

## 2016-01-27 MED ORDER — IOPAMIDOL (ISOVUE-300) INJECTION 61%
125.0000 mL | Freq: Once | INTRAVENOUS | Status: AC | PRN
  Administered 2016-01-27: 125 mL via INTRAVENOUS

## 2016-01-27 NOTE — Progress Notes (Signed)
Referring Physician(s): Jennings,Willard  Supervising Physician: Jacqulynn Cadet  Patient Status:  [facility]  Chief Complaint: Follow up abdominal abscess drain  Subjective: Pt with complex abdominal surgical hx. Underwent colostomy takedown and reversal, developed post op abscess. Perc drain placed 12/24. Pt here for follow up CT and drain injection Still having fair amount of cloudy reddish/purulent drainage from drain. Also having drainage from ostomy takedown site, mostly serosanguinous.   Allergies: Contrast media [iodinated diagnostic agents]  Medications: Prior to Admission medications   Medication Sig Start Date End Date Taking? Authorizing Provider  acetaminophen (TYLENOL) 500 MG tablet Take 1,000 mg by mouth every 6 (six) hours as needed (pain).    Historical Provider, MD  amoxicillin-clavulanate (AUGMENTIN) 875-125 MG tablet Take 1 tablet by mouth every 12 (twelve) hours. 01/13/16   [name], PA-C  digoxin (LANOXIN) 0.125 MG tablet Take 1 tablet (0.125 mg total) by mouth daily. 07/29/15   [name], MD  diltiazem (CARDIZEM CD) 180 MG 24 hr capsule Take 1 capsule (180 mg total) by mouth 2 (two) times daily. 12/03/15   Thompson Grayer, MD  furosemide (LASIX) 40 MG tablet Take 40 mg by mouth daily as needed for fluid.  10/18/15   Historical Provider, MD  lisinopril (PRINIVIL,ZESTRIL) 5 MG tablet Take 5 mg by mouth daily. 10/18/15   Historical Provider, MD  metoprolol (LOPRESSOR) 100 MG tablet TAKE 1 TABLET (100 MG TOTAL) BY MOUTH 2 (TWO) TIMES DAILY. 11/16/15   Sherran Needs, NP  Multiple Vitamin (MULTIVITAMIN WITH MINERALS) TABS tablet Take 1 tablet by mouth daily.    Historical Provider, MD  oxyCODONE (OXY IR/ROXICODONE) 5 MG immediate release tablet Take 1-2 tablets (5-10 mg total) by mouth every 4 (four) hours as needed for moderate pain. 01/13/16   [name], PA-C  rivaroxaban (XARELTO) 20 MG TABS tablet Take 1 tablet (20 mg total) by mouth daily with  supper. 09/23/15   Thompson Grayer, MD  saccharomyces boulardii (FLORASTOR) 250 MG capsule You can buy this over the counter, I would use it as long as your on antibiotics. 01/13/16   [name], PA-C     Vital Signs: BP 115/72   Pulse 90   Temp 98.1 F (36.7 C) (Oral)   SpO2 98%   Physical Exam Abd: soft, NT To the right of midline is an open wound with serosanguinous drainage on dressing. LLQ drain intact, site clean. Drain injection performed. Filling of residual abscess cavity and some filling away from central abscess cavity, but does not appear to be intraluminal. No evidence of contrast tracking to the right sided skin wound and no appreciable drainage noted from that wound during injection.  Imaging:   Labs:  CBC:  Recent Labs  01/08/16 1959 01/09/16 0433 01/12/16 0605 01/13/16 0424  WBC 12.2* 14.2* 9.2 9.0  HGB 11.7* 9.6* 11.0* 10.5*  HCT 36.0* 31.4* 34.3* 33.2*  PLT 574* 507* 426* 465*    COAGS:  Recent Labs  04/13/15 1730  09/30/15 0833 12/07/15 0759 12/08/15 0951 12/08/15 1415 01/09/16 0433 01/10/16 0613  INR 2.62*  < > 1.32 1.12  --   --  1.86 1.21  APTT 49*  --   --   --  30 39*  --   --   < > = values in this interval not displayed.  BMP:  Recent Labs  12/15/15 0416 01/08/16 1959 01/09/16 0433 01/13/16 0424  NA 140 133* 136 134*  K 3.4* 4.6 4.3 3.7  CL 104 100* 104  99*  CO2 27 23 23 24   GLUCOSE 108* 254* 169* 99  BUN 5* 20 19 9   CALCIUM 9.0 10.0 9.1 8.9  CREATININE 1.13 1.41* 1.27* 1.05  GFRNONAA >60 58* >60 >60  GFRAA >60 >60 >60 >60    LIVER FUNCTION TESTS:  Recent Labs  10/23/15 1149 11/17/15 1446 11/18/15 0709 01/08/16 1959  BILITOT 0.9 0.9 0.3 0.3  AST 15 37 20 21  ALT 18 34 33 23  ALKPHOS 57 112 96 67  PROT 7.2 7.2 6.9 8.1  ALBUMIN 3.6 2.6* 2.4* 3.2*    Assessment and Plan: Abdominal abscess CT shows interval improvement though continued presence of fluid filled cavity. Drain injection as discussed above,  no obvious fistula at this time. Have recommended drain exchange and upsize, will schedule this to be done at [facility] or [facility] soon. Hopefully this will facilitate complete evacuation of residual abscess cavity. Otherwise recommend follow up with surgeon as planned.  Electronically Signed: Ascencion Dike 01/27/2016, 1:55 PM   I spent a total of 25 Minutes at the the patient's bedside AND on the patient's hospital floor or unit, greater than 50% of which was counseling/coordinating care for abdominal abscess drain management

## 2016-01-28 ENCOUNTER — Other Ambulatory Visit (HOSPITAL_COMMUNITY): Payer: Self-pay | Admitting: Interventional Radiology

## 2016-01-28 ENCOUNTER — Ambulatory Visit (HOSPITAL_COMMUNITY)
Admission: RE | Admit: 2016-01-28 | Discharge: 2016-01-28 | Disposition: A | Discharge status: Home / Self Care | Payer: BLUE CROSS/BLUE SHIELD | Source: Ambulatory Visit | Attending: Interventional Radiology | Admitting: Interventional Radiology

## 2016-01-28 ENCOUNTER — Encounter (HOSPITAL_COMMUNITY): Payer: Self-pay | Admitting: Diagnostic Radiology

## 2016-01-28 DIAGNOSIS — IMO0002 Reserved for concepts with insufficient information to code with codable children: Secondary | ICD-10-CM

## 2016-01-28 DIAGNOSIS — Z4803 Encounter for change or removal of drains: Secondary | ICD-10-CM | POA: Diagnosis not present

## 2016-01-28 DIAGNOSIS — L02211 Cutaneous abscess of abdominal wall: Secondary | ICD-10-CM | POA: Insufficient documentation

## 2016-01-28 DIAGNOSIS — K651 Peritoneal abscess: Secondary | ICD-10-CM | POA: Diagnosis not present

## 2016-01-28 HISTORY — PX: IR GENERIC HISTORICAL: IMG1180011

## 2016-01-28 MED ORDER — LIDOCAINE HCL (PF) 1 % IJ SOLN
INTRAMUSCULAR | Status: DC | PRN
Start: 2016-01-28 — End: 2016-01-29
  Administered 2016-01-28: 5 mL

## 2016-01-28 MED ORDER — IOPAMIDOL (ISOVUE-300) INJECTION 61%
INTRAVENOUS | Status: AC
  Filled 2016-01-28: qty 50

## 2016-01-28 MED ORDER — LIDOCAINE HCL (PF) 1 % IJ SOLN
INTRAMUSCULAR | Status: AC
  Filled 2016-01-28: qty 10

## 2016-01-28 NOTE — Procedures (Signed)
Patient's drain was evaluated in clinic yesterday and needs drain upsizing today. The existing drain was removed over a wire and new 14 French drain was placed without immediate complication.  Approximately 10 ml of red chunky material was aspirated.  The collection was irrigated until the fluid turned clear.  Patient is scheduled to follow up with Surgery next week and also has a scheduled follow up at IR drain clinic.

## 2016-02-05 ENCOUNTER — Other Ambulatory Visit: Payer: Self-pay | Admitting: General Surgery

## 2016-02-05 DIAGNOSIS — K651 Peritoneal abscess: Secondary | ICD-10-CM

## 2016-02-12 ENCOUNTER — Telehealth: Payer: Self-pay

## 2016-02-12 NOTE — Telephone Encounter (Signed)
Spoke with patient after calling in [facility]  He knows to take Prednisone 50mg  02/16/16 @ 2330, 02/17/16 @ 0530 and 1130.  Also, he is to take Benadryl 50mg  02/17/16 @1130 .  Brita Romp, RN

## 2016-02-17 ENCOUNTER — Ambulatory Visit
Admission: RE | Admit: 2016-02-17 | Discharge: 2016-02-17 | Disposition: A | Discharge status: Home / Self Care | Payer: BLUE CROSS/BLUE SHIELD | Source: Ambulatory Visit | Attending: General Surgery | Admitting: General Surgery

## 2016-02-17 DIAGNOSIS — K651 Peritoneal abscess: Secondary | ICD-10-CM

## 2016-02-17 HISTORY — PX: IR GENERIC HISTORICAL: IMG1180011

## 2016-02-17 MED ORDER — IOPAMIDOL (ISOVUE-300) INJECTION 61%
125.0000 mL | Freq: Once | INTRAVENOUS | Status: AC | PRN
  Administered 2016-02-17: 125 mL via INTRAVENOUS

## 2016-02-17 NOTE — Progress Notes (Signed)
Chief Complaint: Patient was seen in consultation today for follow up abscess drain at the request of [facility]  Referring Physician(s): William Fischer  Supervising Physician: William Fischer  History of Present Illness: William Fischer is a 51 y.o. male here for repeat CT and follow up of LLQ drain. We last saw him on 1/10 and set him up for drain exchange/upsize in hopes for improved evacuation of the abscess. He is feeling better since then. He reports his ostomy closure cite has sealed over. He denies N/V, abd pain, fevers. He does report some 'swelling' just above his midline incision but no pain. He had repeat CT this am and is here for recheck and another drain injection.  Past Medical History:  Diagnosis Date  . Allergy to IVP dye   . Anticoagulated by anticoagulation treatment 01/17/2016   Present on admit  . Atrial fibrillation ([place])    admx 11/13 with acute sCHF in setting of RVR  => a. failed DCCV x 2; b. Pradaxa started;  c. failed sotalol  . Cardiomyopathy ([place])    likely tachy mediated in setting of AF with RVR  . Chicken pox as a child  . Chronic anticoagulation    Pradaxa  . Chronic systolic heart failure ([place])    a. echo 11/13: Ef 40-45%, diff HK, mod MR, mod LAE, mild RVE, mod RAE, small effusion;   b. TEE 11/13:  EF 35-40%, no LAA clot; Echo 2/14 shows normal EF  . Depression with anxiety 06/29/2011  . Dysrhythmia   . Hypertension   . 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 Migraine 06/29/2011  . Obesity 06/29/2011  . Snoring    patient needs sleep study - has declined    Past Surgical History:  Procedure Laterality Date  . CARDIOVERSION  11/30/2011   Procedure: CARDIOVERSION;  Surgeon: Thayer Headings, MD;  Location: [place];  Service: Cardiovascular;  Laterality: N/A;  . CARDIOVERSION  12/03/2011   Procedure: CARDIOVERSION;  Surgeon: Jolaine Artist, MD;  Location: [place];  Service: Cardiovascular;  Laterality: N/A;  . COLECTOMY   12/07/2015   partial  . COLON SURGERY    . COLOSTOMY REVERSAL  12/07/2015  . COLOSTOMY REVERSAL N/A 12/07/2015   Procedure: OPEN REVERSAL OF COLOSTOMY WITH PARTIAL SIGMOID COLECTOMY;  Surgeon: Leighton Ruff, MD;  Location: [place];  Service: General;  Laterality: N/A;  . HERNIA REPAIR    . INCISIONAL HERNIA REPAIR  12/07/2015  . INCISIONAL HERNIA REPAIR N/A 12/07/2015   Procedure: INCISIONAL HERNIA REPAIR;  Surgeon: Leighton Ruff, MD;  Location: [place];  Service: General;  Laterality: N/A;  . IR GENERIC HISTORICAL  08/11/2015   IR RADIOLOGIST EVAL & MGMT 08/11/2015 Markus Daft, MD [facility] INTERV RAD  . IR GENERIC HISTORICAL  11/18/2015   IR CATHETER TUBE CHANGE 11/18/2015 Corrie Mckusick, DO [facility]  . IR GENERIC HISTORICAL  10/15/2015   IR RADIOLOGIST EVAL & MGMT 10/15/2015 Jacqulynn Cadet, MD [facility] INTER[facility]  . IR GENERIC HISTORICAL  01/27/2016   IR RADIOLOGIST EVAL & MGMT 01/27/2016 Ascencion Dike, PA-C [facility] INTER[facility]  . IR GENERIC HISTORICAL  01/28/2016   IR CATHETER TUBE CHANGE 01/28/2016 Markus Daft, MD [facility]  . LAPAROTOMY N/A 01/17/2015   Procedure: EXPLORATORY LAPAROTOMY WITH LEFT COLECTOMY AND COLOSTOMY;  Surgeon: William Bookbinder, MD;  Location: [place];  Service: General;  Laterality: N/A;  . [place]  11/2015  . TEE WITHOUT CARDIOVERSION  11/30/2011   Procedure: TRANSESOPHAGEAL ECHOCARDIOGRAM (  TEE);  Surgeon: Thayer Headings, MD;  Location: [place];  Service: Cardiovascular;  Laterality: N/A;  . TONSILLECTOMY      Allergies: Contrast media [iodinated diagnostic agents]  Medications: Prior to Admission medications   Medication Sig Start Date End Date Taking? Authorizing Provider  acetaminophen (TYLENOL) 500 MG tablet Take 1,000 mg by mouth every 6 (six) hours as needed (pain).    Historical Provider, MD  amoxicillin-clavulanate (AUGMENTIN) 875-125 MG tablet Take 1 tablet by mouth every 12 (twelve) hours. 01/13/16   [name], PA-C  digoxin (LANOXIN) 0.125 MG tablet Take 1 tablet (0.125 mg total) by mouth daily. 07/29/15   [name], MD  diltiazem (CARDIZEM CD) 180 MG 24 hr capsule Take 1 capsule (180 mg total) by mouth 2 (two) times daily. 12/03/15   Thompson Grayer, MD  furosemide (LASIX) 40 MG tablet Take 40 mg by mouth daily as needed for fluid.  10/18/15   Historical Provider, MD  lisinopril (PRINIVIL,ZESTRIL) 5 MG tablet Take 5 mg by mouth daily. 10/18/15   Historical Provider, MD  metoprolol (LOPRESSOR) 100 MG tablet TAKE 1 TABLET (100 MG TOTAL) BY MOUTH 2 (TWO) TIMES DAILY. 11/16/15   Sherran Needs, NP  Multiple Vitamin (MULTIVITAMIN WITH MINERALS) TABS tablet Take 1 tablet by mouth daily.    Historical Provider, MD  oxyCODONE (OXY IR/ROXICODONE) 5 MG immediate release tablet Take 1-2 tablets (5-10 mg total) by mouth every 4 (four) hours as needed for moderate pain. 01/13/16   [name], PA-C  rivaroxaban (XARELTO) 20 MG TABS tablet Take 1 tablet (20 mg total) by mouth daily with supper. 09/23/15   Thompson Grayer, MD  saccharomyces boulardii (FLORASTOR) 250 MG capsule You can buy this over the counter, I would use it as long as your on antibiotics. 01/13/16   [name], PA-C     Family History  Problem Relation Age of Onset  . Hypertension Mother   . Hypertension Father   . Aneurysm Father   . Alcohol abuse Father   . Cancer Maternal Grandmother     brain  . Diabetes Maternal Grandfather     type 2  . Parkinsonism Maternal Grandfather   . Cancer Paternal Grandmother     breast  . Diabetes Paternal Grandmother   . Alcohol abuse Paternal Grandfather     Social History   Social History  . Marital status: Divorced    Spouse name: N/A  . Number of children: N/A  . Years of education: N/A   Social History Main Topics  . Smoking status: Former Smoker    Packs/day: 1.00    Years: 20.00    Types: Cigarettes    Quit date: 07/10/2014  . Smokeless tobacco: Never Used  . Alcohol use  No  . Drug use: No  . Sexual activity: No   Other Topics Concern  . Not on file   Social History Narrative  . No narrative on file     Review of Systems: A 12 point ROS discussed and pertinent positives are indicated in the HPI above.  All other systems are negative.  Review of Systems  Vital Signs: There were no vitals taken for this visit.  Physical Exam Abd: soft, ND. Soft, possible defect or laxity in abd wall at the midline just above incision line. No overlying erythema and no tenderness. LLQ drain site clean. No leakage or erythema. Drain injection performed, see full dictation. No communication to bowel to suggest fistula. Drain pulled at bedside without  difficulty.  Imaging: Ct Abdomen Pelvis W Contrast  Result Date: 02/17/2016 CLINICAL DATA:  Postop intra-abdominal abscess, status post percutaneous drainage EXAM: CT ABDOMEN AND PELVIS WITH CONTRAST TECHNIQUE: Multidetector CT imaging of the abdomen and pelvis was performed using the standard protocol following bolus administration of intravenous contrast. CONTRAST:  158mL ISOVUE-300 IOPAMIDOL (ISOVUE-300) INJECTION 61% COMPARISON:  01/27/2016 FINDINGS: Lower chest: Minor left basilar subsegmental atelectasis. Right lung base clear. Mild cardiac enlargement. No pericardial or pleural effusion. Hepatobiliary: Stable small 13 mm hypodense cyst in the right hepatic lobe posteriorly, image 20. No other hepatic abnormality or biliary dilatation. Gallbladder biliary system appear normal. Pancreas: Unremarkable. No pancreatic ductal dilatation or surrounding inflammatory changes. Spleen: Normal in size without focal abnormality. Adrenals/Urinary Tract: Adrenal glands are unremarkable. Kidneys are normal, without renal calculi, focal lesion, or hydronephrosis. Bladder is unremarkable. Stomach/Bowel: Negative for bowel obstruction, significant dilatation, ileus, or free air. Normal appendix demonstrated containing air. Left lower quadrant  percutaneous abscess drain in stable position. Previous small residual abscess at the drain catheter site has resolved. No residual collection by CT. No new abdominal or pelvic free fluid, fluid collection, or abscess. No hemorrhage or hematoma. Vascular/Lymphatic: No significant vascular findings are present. No enlarged abdominal or pelvic lymph nodes. Reproductive: Prostate is unremarkable. Other: Anterior abdominal wall beneath the skin at the laparotomy site demonstrates a developing slightly enlarging subcutaneous fluid collection over the abdominal musculature measuring 7.4 x 2.1 cm, suspect small developing abdominal wall seroma. Musculoskeletal: No significant or acute osseous finding. IMPRESSION: Resolved abscess at the left lower quadrant drain catheter site. Stable postoperative appearance of the abdomen and pelvis. Small developing midline anterior abdominal wall fluid collection, suspect seroma. Electronically Signed   By: Jerilynn Mages.  Shick M.D.   On: 02/17/2016 13:20     Labs:  CBC:  Recent Labs  01/08/16 1959 01/09/16 0433 01/12/16 0605 01/13/16 0424  WBC 12.2* 14.2* 9.2 9.0  HGB 11.7* 9.6* 11.0* 10.5*  HCT 36.0* 31.4* 34.3* 33.2*  PLT 574* 507* 426* 465*    COAGS:  Recent Labs  04/13/15 1730  09/30/15 0833 12/07/15 0759 12/08/15 0951 12/08/15 1415 01/09/16 0433 01/10/16 0613  INR 2.62*  < > 1.32 1.12  --   --  1.86 1.21  APTT 49*  --   --   --  30 39*  --   --   < > = values in this interval not displayed.  BMP:  Recent Labs  12/15/15 0416 01/08/16 1959 01/09/16 0433 01/13/16 0424  NA 140 133* 136 134*  K 3.4* 4.6 4.3 3.7  CL 104 100* 104 99*  CO2 27 23 23 24   GLUCOSE 108* 254* 169* 99  BUN 5* 20 19 9   CALCIUM 9.0 10.0 9.1 8.9  CREATININE 1.13 1.41* 1.27* 1.05  GFRNONAA >60 58* >60 >60  GFRAA >60 >60 >60 >60    LIVER FUNCTION TESTS:  Recent Labs  10/23/15 1149 11/17/15 1446 11/18/15 0709 01/08/16 1959  BILITOT 0.9 0.9 0.3 0.3  AST 15 37 20 21    ALT 18 34 33 23  ALKPHOS 57 112 96 67  PROT 7.2 7.2 6.9 8.1  ALBUMIN 3.6 2.6* 2.4* 3.2*    TUMOR MARKERS: No results for input(s): AFPTM, CEA, CA199, CHROMGRNA in the last 8760 hours.  Assessment: LLQ abscess s/p perc drain with subsequent upsize. CT shows abscess resolved, drain injection shows no fistula. Drain removed today. Pt to keep follow up with surgeons.   Electronically Signed: Ascencion Dike 02/17/2016, 1:29  PM

## 2016-03-31 ENCOUNTER — Other Ambulatory Visit: Payer: Self-pay

## 2016-03-31 ENCOUNTER — Encounter (INDEPENDENT_AMBULATORY_CARE_PROVIDER_SITE_OTHER): Payer: Self-pay

## 2016-03-31 ENCOUNTER — Ambulatory Visit (HOSPITAL_COMMUNITY): Payer: BLUE CROSS/BLUE SHIELD | Attending: Cardiovascular Disease

## 2016-03-31 DIAGNOSIS — I5022 Chronic systolic (congestive) heart failure: Secondary | ICD-10-CM

## 2016-03-31 DIAGNOSIS — I481 Persistent atrial fibrillation: Secondary | ICD-10-CM | POA: Diagnosis not present

## 2016-03-31 DIAGNOSIS — I340 Nonrheumatic mitral (valve) insufficiency: Secondary | ICD-10-CM | POA: Diagnosis not present

## 2016-03-31 DIAGNOSIS — I4819 Other persistent atrial fibrillation: Secondary | ICD-10-CM

## 2016-04-11 ENCOUNTER — Other Ambulatory Visit: Payer: Self-pay | Admitting: General Surgery

## 2016-04-11 DIAGNOSIS — R1010 Upper abdominal pain, unspecified: Secondary | ICD-10-CM

## 2016-04-11 DIAGNOSIS — IMO0002 Reserved for concepts with insufficient information to code with codable children: Secondary | ICD-10-CM

## 2016-04-12 ENCOUNTER — Other Ambulatory Visit: Payer: Self-pay

## 2016-04-12 ENCOUNTER — Telehealth: Payer: Self-pay

## 2016-04-12 NOTE — Telephone Encounter (Signed)
William Fischer didn't answer his phone, and it is not accepting voice mail messages, so I called his mom to let her know [facility] 13[facility]hour [facility] along with the dosing instructions.  I asked her to remind him he is coming to our [address] site this time, not the [facility] site where he went all the time while he had his drain in.  I gave her my phone number and name in case either of them had any questions.  Brita Romp, RN

## 2016-04-13 ENCOUNTER — Encounter: Payer: Self-pay | Admitting: Internal Medicine

## 2016-04-13 ENCOUNTER — Ambulatory Visit
Admission: RE | Admit: 2016-04-13 | Discharge: 2016-04-13 | Disposition: A | Discharge status: Home / Self Care | Payer: BLUE CROSS/BLUE SHIELD | Source: Ambulatory Visit | Attending: General Surgery | Admitting: General Surgery

## 2016-04-13 DIAGNOSIS — R1010 Upper abdominal pain, unspecified: Secondary | ICD-10-CM

## 2016-04-13 DIAGNOSIS — R109 Unspecified abdominal pain: Secondary | ICD-10-CM | POA: Diagnosis not present

## 2016-04-13 DIAGNOSIS — IMO0002 Reserved for concepts with insufficient information to code with codable children: Secondary | ICD-10-CM

## 2016-04-13 MED ORDER — IOPAMIDOL (ISOVUE-300) INJECTION 61%
125.0000 mL | Freq: Once | INTRAVENOUS | Status: AC | PRN
  Administered 2016-04-13: 125 mL via INTRAVENOUS

## 2016-04-24 NOTE — Progress Notes (Deleted)
Cardiology Office Note Date:  04/24/2016  Patient ID:  William Fischer, William Fischer 1965-11-20, MRN [id number] PCP:  No PCP Per Patient  Electrophysiologist:  ***  ***refresh   Chief Complaint: ***  History of Present Illness: William Fischer is a 51 y.o. 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  He underwent colostomy reversal and hernia repair in November, unfortunately complicated by repeat infections, admitted 01/07/17 woth an abdominal abscess requiring repeat drains, last was 1/11/8 removed 02/17/16.  He comes in today to be seen for Dr. Rayann Heman, last seen by him in December, at that time doing well, planned for echo and f/u visit and discussed titration of his CCB if needed for adequate rate control.  *** bleeding/xarelto  *** xarelto labs *** echo *** HR controlled?    Past Medical History:  Diagnosis Date  . Allergy to IVP dye   . Anticoagulated by anticoagulation treatment 01/17/2016   Present on admit  . Atrial fibrillation ([place])    admx 11/13 with acute sCHF in setting of RVR  => a. failed DCCV x 2; b. Pradaxa started;  c. failed sotalol  . Cardiomyopathy ([place])    likely tachy mediated in setting of AF with RVR  . Chicken pox as a child  . Chronic anticoagulation    Pradaxa  . Chronic systolic heart failure ([place])    a. echo 11/13: Ef 40-45%, diff HK, mod MR, mod LAE, mild RVE, mod RAE, small effusion;   b. TEE 11/13:  EF 35-40%, no LAA clot; Echo 2/14 shows normal EF  . Depression with anxiety 06/29/2011  . Dysrhythmia   . Hypertension   . 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 Migraine 06/29/2011  . Obesity 06/29/2011  . Snoring    patient needs sleep study - has declined    Past Surgical History:  Procedure Laterality Date  . CARDIOVERSION  11/30/2011   Procedure: CARDIOVERSION;   Surgeon: Thayer Headings, MD;  Location: [place];  Service: Cardiovascular;  Laterality: N/A;  . CARDIOVERSION  12/03/2011   Procedure: CARDIOVERSION;  Surgeon: Jolaine Artist, MD;  Location: [place];  Service: Cardiovascular;  Laterality: N/A;  . COLECTOMY  12/07/2015   partial  . COLON SURGERY    . COLOSTOMY REVERSAL  12/07/2015  . COLOSTOMY REVERSAL N/A 12/07/2015   Procedure: OPEN REVERSAL OF COLOSTOMY WITH PARTIAL SIGMOID COLECTOMY;  Surgeon: Leighton Ruff, MD;  Location: [place];  Service: General;  Laterality: N/A;  . HERNIA REPAIR    . INCISIONAL HERNIA REPAIR  12/07/2015  . INCISIONAL HERNIA REPAIR N/A 12/07/2015   Procedure: INCISIONAL HERNIA REPAIR;  Surgeon: Leighton Ruff, MD;  Location: [place];  Service: General;  Laterality: N/A;  . IR GENERIC HISTORICAL  08/11/2015   IR RADIOLOGIST EVAL & MGMT 08/11/2015 Markus Daft, MD [facility]  . IR GENERIC HISTORICAL  11/18/2015   IR CATHETER TUBE CHANGE 11/18/2015 Corrie Mckusick, DO [facility]  . IR GENERIC HISTORICAL  10/15/2015   IR RADIOLOGIST EVAL & MGMT 10/15/2015 Jacqulynn Cadet, MD [facility]  . IR GENERIC HISTORICAL  01/27/2016   IR RADIOLOGIST EVAL & MGMT 01/27/2016 Ascencion Dike, PA-C [facility]  . IR GENERIC HISTORICAL  01/28/2016   IR CATHETER TUBE CHANGE 01/28/2016 Markus Daft, MD [facility]  . IR GENERIC HISTORICAL  02/17/2016  IR RADIOLOGIST EVAL & MGMT 02/17/2016 Ascencion Dike, PA-C [facility]  . LAPAROTOMY N/A 01/17/2015   Procedure: EXPLORATORY LAPAROTOMY WITH LEFT COLECTOMY AND COLOSTOMY;  Surgeon: Rolm Bookbinder, MD;  Location: [place];  Service: General;  Laterality: N/A;  . [place]  11/2015  . TEE WITHOUT CARDIOVERSION  11/30/2011   Procedure: TRANSESOPHAGEAL ECHOCARDIOGRAM (TEE);  Surgeon: Thayer Headings, MD;  Location: [place];  Service: Cardiovascular;  Laterality: N/A;  . TONSILLECTOMY      Current Outpatient Prescriptions  Medication  Sig Dispense Refill  . acetaminophen (TYLENOL) 500 MG tablet Take 1,000 mg by mouth every 6 (six) hours as needed (pain).    [name] amoxicillin-clavulanate (AUGMENTIN) 875-125 MG tablet Take 1 tablet by mouth every 12 (twelve) hours. 30 tablet 1  . digoxin (LANOXIN) 0.125 MG tablet Take 1 tablet (0.125 mg total) by mouth daily. 34 tablet 0  . diltiazem (CARDIZEM CD) 180 MG 24 hr capsule Take 1 capsule (180 mg total) by mouth 2 (two) times daily. 60 capsule 10  . furosemide (LASIX) 40 MG tablet Take 40 mg by mouth daily as needed for fluid.   10  . lisinopril (PRINIVIL,ZESTRIL) 5 MG tablet Take 5 mg by mouth daily.  10  . metoprolol (LOPRESSOR) 100 MG tablet TAKE 1 TABLET (100 MG TOTAL) BY MOUTH 2 (TWO) TIMES DAILY. 60 tablet 11  . Multiple Vitamin (MULTIVITAMIN WITH MINERALS) TABS tablet Take 1 tablet by mouth daily.    [name] oxyCODONE (OXY IR/ROXICODONE) 5 MG immediate release tablet Take 1-2 tablets (5-10 mg total) by mouth every 4 (four) hours as needed for moderate pain. 20 tablet 0  . rivaroxaban (XARELTO) 20 MG TABS tablet Take 1 tablet (20 mg total) by mouth daily with supper. 30 tablet 11  . saccharomyces boulardii (FLORASTOR) 250 MG capsule You can buy this over the counter, I would use it as long as your on antibiotics.     No current facility-administered medications for this visit.     Allergies:   Contrast media [iodinated diagnostic agents]   Social History:  The patient  reports that he quit smoking about 21 months ago. His smoking use included Cigarettes. He has a 20.00 pack-year smoking history. He has never used smokeless tobacco. He reports that he does not drink alcohol or use drugs.   Family History:  The patient's family history includes Alcohol abuse in his father and paternal grandfather; Aneurysm in his father; Cancer in his maternal grandmother and paternal grandmother; Diabetes in his maternal grandfather and paternal grandmother; Hypertension in his father and mother;  Parkinsonism in his maternal grandfather.  ROS:  Please see the history of present illness.   All other systems are reviewed and otherwise negative.   PHYSICAL EXAM: *** VS:  There were no vitals taken for this visit. BMI: There is no height or weight on file to calculate BMI. Well nourished, well developed, in no acute distress  HEENT: normocephalic, atraumatic  Neck: no JVD, carotid bruits or masses Cardiac:  *** RRR; no significant murmurs, no rubs, or gallops Lungs:  *** CTA b/l, no wheezing, rhonchi or rales  Abd: soft, nontender MS: no deformity or atrophy Ext: *** no edema  Skin: warm and dry, no rash Neuro:  No gross deficits appreciated Psych: euthymic mood, full affect   EKG:  Done 12/31/15 was AF, 72bpm  03/31/16: TTE Study Conclusions - Left ventricle: The cavity size was moderately dilated. Systolic   function was normal. The  estimated ejection fraction was in the   range of 60% to 65%. Wall motion was normal; there were no   regional wall motion abnormalities. - Aortic valve: Transvalvular velocity was within the normal range.   There was no stenosis. There was no regurgitation. - Mitral valve: Transvalvular velocity was within the normal range.   There was no evidence for stenosis. There was mild regurgitation. - Left atrium: The appendage was severely dilated. 47mm - Right ventricle: The cavity size was mildly dilated. Wall   thickness was normal. Systolic function was normal. - Right atrium: The appendage was moderately dilated. - Atrial septum: No defect or patent foramen ovale was identified   by color flow Doppler. - Tricuspid valve: There was trivial regurgitation. - Pulmonary arteries: Systolic pressure was within the normal   range. PA peak pressure: 30 mm Hg (S).  Recent Labs: 01/08/2016: ALT 23 01/13/2016: BUN 9; Creatinine, Ser 1.05; Hemoglobin 10.5; Platelets 465; Potassium 3.7; Sodium 134  No results found for requested labs within last 8760  hours.   CrCl cannot be calculated (Patient's most recent lab result is older than the maximum 21 days allowed.).   Wt Readings from Last 3 Encounters:  01/08/16 255 lb (115.7 kg)  12/31/15 253 lb 6.4 oz (114.9 kg)  12/12/15 267 lb 13.7 oz (121.5 kg)     Other studies reviewed: Additional studies/records reviewed today include: summarized above  ASSESSMENT AND PLAN:  1. Persistent AFib     CHA2DS2Vasc is at least 3, on Xarelto    ***  2. NICM     Echo remains with improved LVEF     ***  3. HTN     ***  Disposition: F/u with ***  Current medicines are reviewed at length with the patient today.  The patient did not have any concerns regarding medicines.***  Signed, Jennings Books, PA-C 04/24/2016 9:09 AM     [facility] [address] [place] [address] [phone] (office)  [phone] (fax)

## 2016-04-25 ENCOUNTER — Ambulatory Visit: Payer: BLUE CROSS/BLUE SHIELD | Admitting: Physician Assistant

## 2016-05-04 ENCOUNTER — Other Ambulatory Visit: Payer: Self-pay | Admitting: Physician Assistant

## 2016-05-04 DIAGNOSIS — I482 Chronic atrial fibrillation, unspecified: Secondary | ICD-10-CM

## 2016-05-10 NOTE — Progress Notes (Signed)
Cardiology Office Note Date:  05/11/2016  Patient ID:  William Fischer, William Fischer Apr 10, 1965, MRN [id number] PCP:  No PCP Per Patient  Electrophysiologist:  Dr. Rayann Heman   Chief Complaint: planned follow up  History of Present Illness: William Fischer is a 51 y.o. 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  He underwent colostomy reversal and hernia repair in November, unfortunately complicated by repeat infections, admitted 01/07/17 with an abdominal abscess requiring repeat drains, last was 1/11/8 removed 02/17/16.  He comes in today to be seen for Dr. Rayann Heman, last seen by him in December, at that time doing well, planned for echo and f/u visit and discussed titration of his CCB if needed for adequate rate control.  The patient reports he feels like he is doing GREAT!  He feels completely recovered from all of his abdominal problems, and reports being active.  He does not perceive any kind of palpitations, or symptoms with his AF, he denies any CP or SOB, no dizziness, near syncope or syncope.  He had once a small amount of blood with one BM several weeks ago, none prior or since.  No other bleeding or signs of bleeding   Past Medical History:  Diagnosis Date  . Allergy to IVP dye   . Anticoagulated by anticoagulation treatment 01/17/2016   Present on admit  . Atrial fibrillation ([place])    admx 11/13 with acute sCHF in setting of RVR  => a. failed DCCV x 2; b. Pradaxa started;  c. failed sotalol  . Cardiomyopathy ([place])    likely tachy mediated in setting of AF with RVR  . Chicken pox as a child  . Chronic anticoagulation    Pradaxa  . Chronic systolic heart failure ([place])    a. echo 11/13: Ef 40-45%, diff HK, mod MR, mod LAE, mild RVE, mod RAE, small effusion;   b. TEE 11/13:  EF 35-40%, no LAA clot; Echo 2/14  shows normal EF  . Depression with anxiety 06/29/2011  . Dysrhythmia   . Hypertension   . 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 Migraine 06/29/2011  . Obesity 06/29/2011  . Snoring    patient needs sleep study - has declined    Past Surgical History:  Procedure Laterality Date  . CARDIOVERSION  11/30/2011   Procedure: CARDIOVERSION;  Surgeon: Thayer Headings, MD;  Location: [place];  Service: Cardiovascular;  Laterality: N/A;  . CARDIOVERSION  12/03/2011   Procedure: CARDIOVERSION;  Surgeon: Jolaine Artist, MD;  Location: [place];  Service: Cardiovascular;  Laterality: N/A;  . COLECTOMY  12/07/2015   partial  . COLON SURGERY    . COLOSTOMY REVERSAL  12/07/2015  . COLOSTOMY REVERSAL N/A 12/07/2015   Procedure: OPEN REVERSAL OF COLOSTOMY WITH PARTIAL SIGMOID COLECTOMY;  Surgeon: Leighton Ruff, MD;  Location: [place];  Service: General;  Laterality: N/A;  . HERNIA REPAIR    . INCISIONAL HERNIA REPAIR  12/07/2015  . INCISIONAL HERNIA REPAIR N/A 12/07/2015   Procedure: INCISIONAL HERNIA REPAIR;  Surgeon: Leighton Ruff, MD;  Location: [place];  Service: General;  Laterality: N/A;  . IR GENERIC HISTORICAL  08/11/2015   IR RADIOLOGIST EVAL & MGMT 08/11/2015 Markus Daft, MD [facility]  . IR GENERIC HISTORICAL  11/18/2015   IR CATHETER TUBE CHANGE 11/18/2015 Corrie Mckusick, DO [facility]  .  IR GENERIC HISTORICAL  10/15/2015   IR RADIOLOGIST EVAL & MGMT 10/15/2015 Jacqulynn Cadet, MD [facility]  . IR GENERIC HISTORICAL  01/27/2016   IR RADIOLOGIST EVAL & MGMT 01/27/2016 Ascencion Dike, PA-C [facility]  . IR GENERIC HISTORICAL  01/28/2016   IR CATHETER TUBE CHANGE 01/28/2016 Markus Daft, MD [facility]  . IR GENERIC HISTORICAL  02/17/2016   IR RADIOLOGIST EVAL & MGMT 02/17/2016 Ascencion Dike, PA-C [facility]  . LAPAROTOMY N/A 01/17/2015   Procedure: EXPLORATORY LAPAROTOMY WITH LEFT COLECTOMY AND COLOSTOMY;  Surgeon: Rolm Bookbinder, MD;  Location: [place];  Service: General;   Laterality: N/A;  . [place]  11/2015  . TEE WITHOUT CARDIOVERSION  11/30/2011   Procedure: TRANSESOPHAGEAL ECHOCARDIOGRAM (TEE);  Surgeon: Thayer Headings, MD;  Location: [place];  Service: Cardiovascular;  Laterality: N/A;  . TONSILLECTOMY      Current Outpatient Prescriptions  Medication Sig Dispense Refill  . acetaminophen (TYLENOL) 500 MG tablet Take 1,000 mg by mouth every 6 (six) hours as needed (pain).    [name] digoxin (LANOXIN) 0.125 MG tablet TAKE 1 TABLET (0.125 MG TOTAL) BY MOUTH DAILY. 30 tablet 7  . diltiazem (CARDIZEM CD) 180 MG 24 hr capsule Take 1 capsule (180 mg total) by mouth 2 (two) times daily. 60 capsule 10  . furosemide (LASIX) 40 MG tablet Take 40 mg by mouth daily as needed for fluid.   10  . lisinopril (PRINIVIL,ZESTRIL) 5 MG tablet TAKE 1 TABLET (5 MG TOTAL) BY MOUTH DAILY. 30 tablet 7  . metoprolol (LOPRESSOR) 100 MG tablet TAKE 1 TABLET (100 MG TOTAL) BY MOUTH 2 (TWO) TIMES DAILY. 60 tablet 11  . Multiple Vitamin (MULTIVITAMIN WITH MINERALS) TABS tablet Take 1 tablet by mouth daily.    . rivaroxaban (XARELTO) 20 MG TABS tablet Take 1 tablet (20 mg total) by mouth daily with supper. 30 tablet 11   No current facility-administered medications for this visit.     Allergies:   Contrast media [iodinated diagnostic agents]   Social History:  The patient  reports that he quit smoking about 22 months ago. His smoking use included Cigarettes. He has a 20.00 pack-year smoking history. He has never used smokeless tobacco. He reports that he does not drink alcohol or use drugs.   Family History:  The patient's family history includes Alcohol abuse in his father and paternal grandfather; Aneurysm in his father; Cancer in his maternal grandmother and paternal grandmother; Diabetes in his maternal grandfather and paternal grandmother; Hypertension in his father and mother; Parkinsonism in his maternal grandfather.  ROS:  Please see  the history of present illness.   All other systems are reviewed and otherwise negative.  PHYSICAL EXAM:  VS:  BP 110/64   Pulse 76   Ht 5\' 10"  (1.778 m)   Wt 283 lb (128.4 kg)   BMI 40.61 kg/m  BMI: Body mass index is 40.61 kg/m. Well nourished, well developed, in no acute distress  HEENT: normocephalic, atraumatic  Neck: no JVD, carotid bruits or masses Cardiac:  IRRR; no significant murmurs, no rubs, or gallops Lungs:  CTA b/l, no wheezing, rhonchi or rales  Abd: soft, nontender MS: no deformity or atrophy Ext: no edema  Skin: warm and dry, no rash Neuro:  No gross deficits appreciated Psych: euthymic mood, full affect   EKG:  Done 12/31/15 was AF, 72bpm  03/31/16: TTE Study Conclusions - Left ventricle: The cavity size was moderately dilated. Systolic  function was normal. The estimated ejection fraction was in the   range of 60% to 65%. Wall motion was normal; there were no   regional wall motion abnormalities. - Aortic valve: Transvalvular velocity was within the normal range.   There was no stenosis. There was no regurgitation. - Mitral valve: Transvalvular velocity was within the normal range.   There was no evidence for stenosis. There was mild regurgitation. - Left atrium: The appendage was severely dilated. 22mm - Right ventricle: The cavity size was mildly dilated. Wall   thickness was normal. Systolic function was normal. - Right atrium: The appendage was moderately dilated. - Atrial septum: No defect or patent foramen ovale was identified   by color flow Doppler. - Tricuspid valve: There was trivial regurgitation. - Pulmonary arteries: Systolic pressure was within the normal   range. PA peak pressure: 30 mm Hg (S).  Recent Labs: 01/08/2016: ALT 23 01/13/2016: BUN 9; Creatinine, Ser 1.05; Hemoglobin 10.5; Platelets 465; Potassium 3.7; Sodium 134  No results found for requested labs within last 8760 hours.   CrCl cannot be calculated (Patient's most  recent lab result is older than the maximum 21 days allowed.).   Wt Readings from Last 3 Encounters:  05/11/16 283 lb (128.4 kg)  01/08/16 255 lb (115.7 kg)  12/31/15 253 lb 6.4 oz (114.9 kg)     Other studies reviewed: Additional studies/records reviewed today include: summarized above  ASSESSMENT AND PLAN:  1. Persistent AFib     CHA2DS2Vasc is at least 3, on Xarelto     He reports compliance with his a/c     No symptoms of AF, he is unaware     76bpm here today  2. NICM     Echo remains with improved LVEF     Exam does not suggest fluid OL  3. HTN     Looks OK, no changes  Disposition: F/u with Dr. Rayann Heman as planned, sooner if needed.  Current medicines are reviewed at length with the patient today.  The patient did not have any concerns regarding medicines.  Haywood Lasso, PA-C 05/11/2016 12:04 PM     [place] [address] [phone] (office)  [phone] (fax)

## 2016-05-11 ENCOUNTER — Ambulatory Visit (INDEPENDENT_AMBULATORY_CARE_PROVIDER_SITE_OTHER): Payer: BLUE CROSS/BLUE SHIELD | Admitting: Physician Assistant

## 2016-05-11 ENCOUNTER — Encounter (INDEPENDENT_AMBULATORY_CARE_PROVIDER_SITE_OTHER): Payer: Self-pay

## 2016-05-11 VITALS — BP 110/64 | HR 76 | Ht 70.0 in | Wt 283.0 lb

## 2016-05-11 DIAGNOSIS — I481 Persistent atrial fibrillation: Secondary | ICD-10-CM

## 2016-05-11 DIAGNOSIS — I10 Essential (primary) hypertension: Secondary | ICD-10-CM

## 2016-05-11 DIAGNOSIS — I420 Dilated cardiomyopathy: Secondary | ICD-10-CM

## 2016-05-11 DIAGNOSIS — I4819 Other persistent atrial fibrillation: Secondary | ICD-10-CM

## 2016-05-11 NOTE — Patient Instructions (Signed)
Medication Instructions:   Your physician recommends that you continue on your current medications as directed. Please refer to the Current Medication list given to you today.   If you need a refill on your cardiac medications before your next appointment, please call your pharmacy.  Labwork: NONE ORDERED  TODAY    Testing/Procedures: NONE ORDERED  TODAY    Follow-Up: IN 3 MONTHS WITH DR ALLRED    Any Other Special Instructions Will Be Listed Below (If Applicable).                                                                                                                                                   

## 2016-07-02 ENCOUNTER — Other Ambulatory Visit: Payer: Self-pay | Admitting: Internal Medicine

## 2016-07-02 DIAGNOSIS — I5022 Chronic systolic (congestive) heart failure: Secondary | ICD-10-CM

## 2016-07-04 ENCOUNTER — Ambulatory Visit: Payer: Self-pay | Admitting: Internal Medicine

## 2016-08-15 ENCOUNTER — Ambulatory Visit: Payer: BLUE CROSS/BLUE SHIELD | Admitting: Internal Medicine

## 2016-08-16 ENCOUNTER — Encounter: Payer: Self-pay | Admitting: Internal Medicine

## 2016-09-03 ENCOUNTER — Other Ambulatory Visit: Payer: Self-pay | Admitting: Internal Medicine

## 2016-10-12 ENCOUNTER — Ambulatory Visit (INDEPENDENT_AMBULATORY_CARE_PROVIDER_SITE_OTHER): Payer: BLUE CROSS/BLUE SHIELD | Admitting: Internal Medicine

## 2016-10-12 VITALS — BP 104/78 | HR 57 | Ht 70.0 in | Wt 308.0 lb

## 2016-10-12 DIAGNOSIS — I420 Dilated cardiomyopathy: Secondary | ICD-10-CM | POA: Diagnosis not present

## 2016-10-12 DIAGNOSIS — Z6841 Body Mass Index (BMI) 40.0 and over, adult: Secondary | ICD-10-CM

## 2016-10-12 DIAGNOSIS — I10 Essential (primary) hypertension: Secondary | ICD-10-CM

## 2016-10-12 DIAGNOSIS — I4819 Other persistent atrial fibrillation: Secondary | ICD-10-CM

## 2016-10-12 DIAGNOSIS — I481 Persistent atrial fibrillation: Secondary | ICD-10-CM | POA: Diagnosis not present

## 2016-10-12 LAB — CBC WITH DIFFERENTIAL/PLATELET
BASOS ABS: 0 10*3/uL (ref 0.0–0.2)
Basos: 1 %
EOS (ABSOLUTE): 0.2 10*3/uL (ref 0.0–0.4)
EOS: 3 %
HEMATOCRIT: 46.6 % (ref 37.5–51.0)
HEMOGLOBIN: 16.3 g/dL (ref 13.0–17.7)
IMMATURE GRANS (ABS): 0 10*3/uL (ref 0.0–0.1)
Immature Granulocytes: 0 %
LYMPHS: 26 %
Lymphocytes Absolute: 2 10*3/uL (ref 0.7–3.1)
MCH: 31.9 pg (ref 26.6–33.0)
MCHC: 35 g/dL (ref 31.5–35.7)
MCV: 91 fL (ref 79–97)
MONOCYTES: 12 %
Monocytes Absolute: 0.9 10*3/uL (ref 0.1–0.9)
NEUTROS ABS: 4.5 10*3/uL (ref 1.4–7.0)
Neutrophils: 58 %
Platelets: 313 10*3/uL (ref 150–379)
RBC: 5.11 x10E6/uL (ref 4.14–5.80)
RDW: 14.2 % (ref 12.3–15.4)
WBC: 7.6 10*3/uL (ref 3.4–10.8)

## 2016-10-12 LAB — BASIC METABOLIC PANEL
BUN / CREAT RATIO: 14 (ref 9–20)
BUN: 13 mg/dL (ref 6–24)
CALCIUM: 9.4 mg/dL (ref 8.7–10.2)
CHLORIDE: 100 mmol/L (ref 96–106)
CO2: 21 mmol/L (ref 20–29)
CREATININE: 0.95 mg/dL (ref 0.76–1.27)
GFR calc non Af Amer: 94 mL/min/{1.73_m2} (ref >59)
GFR, EST AFRICAN AMERICAN: 108 mL/min/{1.73_m2} (ref >59)
Glucose: 104 mg/dL — ABNORMAL HIGH (ref 65–99)
Potassium: 4.6 mmol/L (ref 3.5–5.2)
Sodium: 139 mmol/L (ref 134–144)

## 2016-10-12 LAB — DIGOXIN LEVEL: DIGOXIN, SERUM: 0.4 ng/mL — AB (ref 0.5–0.9)

## 2016-10-12 NOTE — Patient Instructions (Addendum)
Medication Instructions:  Your physician recommends that you continue on your current medications as directed. Please refer to the Current Medication list given to you today.   Labwork: Your physician recommends that you return for lab work today:  BMP/CBC/DIG    Testing/Procedures: None ordered   Follow-Up: Your physician wants you to follow-up in: 6 months with Tommye Standard, PA. You will receive a reminder letter in the mail two months in advance. If you don't receive a letter, please call our office to schedule the follow-up appointment.   Any Other Special Instructions Will Be Listed Below (If Applicable).     If you need a refill on your cardiac medications before your next appointment, please call your pharmacy.

## 2016-10-12 NOTE — Progress Notes (Signed)
PCP: Patient, No Pcp Per  Primary EP: Dr Rayann Heman  William Fischer is a 51 y.o. male who presents today for routine electrophysiology followup.  Since last being seen in our clinic, the patient reports doing very well.  He has recovered from prior GI surgeries. Asymptomatic with afib.  Today, he denies symptoms of palpitations, chest pain, shortness of breath,  lower extremity edema, dizziness, presyncope, or syncope.  The patient is otherwise without complaint today.   Past Medical History:  Diagnosis Date  . Allergy to IVP dye   . Anticoagulated by anticoagulation treatment 01/17/2016   Present on admit  . Atrial fibrillation ([place])    admx 11/13 with acute sCHF in setting of RVR  => a. failed DCCV x 2; b. Pradaxa started;  c. failed sotalol  . Cardiomyopathy ([place])    likely tachy mediated in setting of AF with RVR  . Chicken pox as a child  . Chronic anticoagulation    Pradaxa  . Chronic systolic heart failure ([place])    a. echo 11/13: Ef 40-45%, diff HK, mod MR, mod LAE, mild RVE, mod RAE, small effusion;   b. TEE 11/13:  EF 35-40%, no LAA clot; Echo 2/14 shows normal EF  . Depression with anxiety 06/29/2011  . Dysrhythmia   . Hypertension   . 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 Migraine 06/29/2011  . Obesity 06/29/2011  . Snoring    patient needs sleep study - has declined   Past Surgical History:  Procedure Laterality Date  . CARDIOVERSION  11/30/2011   Procedure: CARDIOVERSION;  Surgeon: Thayer Headings, MD;  Location: [place];  Service: Cardiovascular;  Laterality: N/A;  . CARDIOVERSION  12/03/2011   Procedure: CARDIOVERSION;  Surgeon: Jolaine Artist, MD;  Location: [place];  Service: Cardiovascular;  Laterality: N/A;  . COLECTOMY  12/07/2015   partial  . COLON SURGERY    . COLOSTOMY REVERSAL  12/07/2015  . COLOSTOMY REVERSAL N/A 12/07/2015   Procedure: OPEN REVERSAL OF COLOSTOMY WITH PARTIAL SIGMOID COLECTOMY;  Surgeon: Leighton Ruff, MD;  Location: [place];   Service: General;  Laterality: N/A;  . HERNIA REPAIR    . INCISIONAL HERNIA REPAIR  12/07/2015  . INCISIONAL HERNIA REPAIR N/A 12/07/2015   Procedure: INCISIONAL HERNIA REPAIR;  Surgeon: Leighton Ruff, MD;  Location: [place];  Service: General;  Laterality: N/A;  . IR GENERIC HISTORICAL  08/11/2015   IR RADIOLOGIST EVAL & MGMT 08/11/2015 Markus Daft, MD [facility]  . IR GENERIC HISTORICAL  11/18/2015   IR CATHETER TUBE CHANGE 11/18/2015 Corrie Mckusick, DO [facility]  . IR GENERIC HISTORICAL  10/15/2015   IR RADIOLOGIST EVAL & MGMT 10/15/2015 Jacqulynn Cadet, MD [facility]  . IR GENERIC HISTORICAL  01/27/2016   IR RADIOLOGIST EVAL & MGMT 01/27/2016 Ascencion Dike, PA-C [facility]  . IR GENERIC HISTORICAL  01/28/2016   IR CATHETER TUBE CHANGE 01/28/2016 Markus Daft, MD [facility]  . IR GENERIC HISTORICAL  02/17/2016   IR RADIOLOGIST EVAL & MGMT 02/17/2016 Ascencion Dike, PA-C [facility]  . LAPAROTOMY N/A 01/17/2015   Procedure: EXPLORATORY LAPAROTOMY WITH LEFT COLECTOMY AND COLOSTOMY;  Surgeon: Rolm Bookbinder, MD;  Location: [place];  Service: General;  Laterality: N/A;  . [place]  11/2015  . TEE WITHOUT CARDIOVERSION  11/30/2011   Procedure: TRANSESOPHAGEAL ECHOCARDIOGRAM (TEE);  Surgeon: Thayer Headings, MD;  Location: [place];  Service: Cardiovascular;  Laterality: N/A;  . TONSILLECTOMY  ROS- all systems are reviewed and negatives except as per HPI above  Current Outpatient Prescriptions  Medication Sig Dispense Refill  . acetaminophen (TYLENOL) 500 MG tablet Take 1,000 mg by mouth every 6 (six) hours as needed (pain).    [name] digoxin (LANOXIN) 0.125 MG tablet TAKE 1 TABLET (0.125 MG TOTAL) BY MOUTH DAILY. 30 tablet 7  . diltiazem (CARDIZEM CD) 180 MG 24 hr capsule Take 1 capsule (180 mg total) by mouth 2 (two) times daily. 60 capsule 10  . furosemide (LASIX) 40 MG tablet TAKE 1 TABLET (40 MG TOTAL) BY MOUTH DAILY AS NEEDED  FOR EDEMA. 30 tablet 11  . lisinopril (PRINIVIL,ZESTRIL) 5 MG tablet TAKE 1 TABLET (5 MG TOTAL) BY MOUTH DAILY. 30 tablet 7  . metoprolol (LOPRESSOR) 100 MG tablet TAKE 1 TABLET (100 MG TOTAL) BY MOUTH 2 (TWO) TIMES DAILY. 60 tablet 11  . Multiple Vitamin (MULTIVITAMIN WITH MINERALS) TABS tablet Take 1 tablet by mouth daily.    Alveda Reasons 20 MG TABS tablet TAKE 1 TABLET BY MOUTH DAILY WITH SUPPER 30 tablet 5   No current facility-administered medications for this visit.     Physical Exam: Vitals:   10/12/16 1205  BP: 104/78  Pulse: (!) 57  SpO2: 96%  Weight: (!) 308 lb (139.7 kg)  Height: 5\' 10"  (1.778 m)    GEN- The patient is well appearing, alert and oriented x 3 today.   Head- normocephalic, atraumatic Eyes-  Sclera clear, conjunctiva pink Ears- hearing intact Oropharynx- clear Lungs- Clear to ausculation bilaterally, normal work of breathing Heart- irregular rate and rhythm, no murmurs, rubs or gallops, PMI not laterally displaced GI- soft, NT, ND, + BS Extremities- no clubbing, cyanosis, or edema  EKG tracing ordered today is personally reviewed and shows afib, V rate 57 bpm, poor r wave transition, low voltage  Echo 3/18 reviewed  Assessment and Plan:  1. Longstanding persistent afib Doing very well with current treament He had mesenteric infarct during medical noncompliance with anticoagulation in the past.  Importance of compliance with anticoagulation was stressed again today. Check dig level, bmet,  Cbc today Would consider stopping digoxin in the future given studies showing increased mortality with AF with this medicines. As he is doing so well, I am reluctant to make changes today  2. Nonischemic CM EF has recovered No changes bmet  3. Obesity Body mass index is 44.19 kg/m. Weight loss/ lifestyle modification advised  4. HTN Stable No change required today bmet  Follow-up with EP PA-C in 6 months  Thompson Grayer MD, [facility] 10/12/2016 12:16 PM

## 2016-10-19 DIAGNOSIS — Z23 Encounter for immunization: Secondary | ICD-10-CM | POA: Diagnosis not present

## 2016-11-02 ENCOUNTER — Other Ambulatory Visit (HOSPITAL_COMMUNITY): Payer: Self-pay | Admitting: Nurse Practitioner

## 2016-11-02 ENCOUNTER — Other Ambulatory Visit: Payer: Self-pay | Admitting: Internal Medicine

## 2016-11-02 DIAGNOSIS — I482 Chronic atrial fibrillation, unspecified: Secondary | ICD-10-CM

## 2017-01-04 ENCOUNTER — Other Ambulatory Visit: Payer: Self-pay | Admitting: Physician Assistant

## 2017-01-04 DIAGNOSIS — I482 Chronic atrial fibrillation, unspecified: Secondary | ICD-10-CM

## 2017-03-16 ENCOUNTER — Other Ambulatory Visit: Payer: Self-pay | Admitting: Internal Medicine

## 2017-03-23 DIAGNOSIS — K55059 Acute (reversible) ischemia of intestine, part and extent unspecified: Secondary | ICD-10-CM | POA: Diagnosis not present

## 2017-03-23 DIAGNOSIS — L709 Acne, unspecified: Secondary | ICD-10-CM | POA: Diagnosis not present

## 2017-03-23 DIAGNOSIS — K432 Incisional hernia without obstruction or gangrene: Secondary | ICD-10-CM | POA: Diagnosis not present

## 2017-03-23 DIAGNOSIS — I420 Dilated cardiomyopathy: Secondary | ICD-10-CM | POA: Diagnosis not present

## 2017-03-23 DIAGNOSIS — Z9049 Acquired absence of other specified parts of digestive tract: Secondary | ICD-10-CM | POA: Insufficient documentation

## 2017-03-23 DIAGNOSIS — I10 Essential (primary) hypertension: Secondary | ICD-10-CM | POA: Diagnosis not present

## 2017-03-23 DIAGNOSIS — Z7721 Contact with and (suspected) exposure to potentially hazardous body fluids: Secondary | ICD-10-CM | POA: Diagnosis not present

## 2017-03-23 DIAGNOSIS — L0102 Bockhart's impetigo: Secondary | ICD-10-CM | POA: Diagnosis not present

## 2017-03-27 ENCOUNTER — Telehealth (HOSPITAL_COMMUNITY): Payer: Self-pay | Admitting: *Deleted

## 2017-03-27 MED ORDER — METOPROLOL TARTRATE 100 MG PO TABS: 100.0000 mg | ORAL_TABLET | Freq: Two times a day (BID) | ORAL | 5 refills | Status: DC

## 2017-03-27 NOTE — Addendum Note (Signed)
Addended by: Derl Barrow on: 03/27/2017 04:19 PM   Modules accepted: Orders

## 2017-03-27 NOTE — Telephone Encounter (Signed)
Pt's medication was sent to pt's pharmacy as requested. Confirmation received.  °

## 2017-03-27 NOTE — Telephone Encounter (Signed)
Patient called to clinic needing refill of metoprolol to [facility]. Patient is followed by Dr. Ewell Poe Will forward to RX pool at [facility].

## 2017-04-09 NOTE — Progress Notes (Signed)
Cardiology Office Note Date:  04/09/2017  Patient ID:  William Fischer, William Fischer 1965/05/08, MRN [id number] PCP:  Patient, No Pcp Per  Electrophysiologist:  Dr. Rayann Heman   Chief Complaint: planned follow up  History of Present Illness: William Fischer is a 52 y.o. 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  He underwent colostomy reversal and hernia repair in November, unfortunately complicated by repeat infections, admitted 01/07/17 with an abdominal abscess requiring repeat drains, last was 1/11/8 removed 02/17/16.  He comes in today to be seen for Dr. Rayann Heman, last seen by him in September, at that time doing well, planned for labs and f/u visit and discussed discontinuation of his dig, though doing well and continued.  Unfortunately he has an increasing ventral hernia that has become very large and will need surgery, not yet scheduled but probably in the next couple weeks.  This is very uncomfortable for him.  He has gained weight but feels this is has been slow and steady, and relates it to his abdomen, he denies any DOE or SOB, denies symptoms of PND or orthopnea, he has not perceived or noted any swelling/edema.  He denies any CP, no particular cardiac awareness at all, unaware of his AFib.  No dizziness, near syncope or syncope. He had his abd CT this AM, had to drink "2gallons of that chalky contrast" thinks this is part of the weight and has left him a little nauseous this AM.  RCRI score is 6.6 given his hx of CM?CHF/intra-abdominal surgery/surgery type DASI score is 36.7,  7.25METS  Past Medical History:  Diagnosis Date  . Allergy to IVP dye   . Anticoagulated by anticoagulation treatment 01/17/2016   Present on admit  . Atrial fibrillation ([place])    admx 11/13 with acute sCHF in setting of RVR  => a.  failed DCCV x 2; b. Pradaxa started;  c. failed sotalol  . Cardiomyopathy ([place])    likely tachy mediated in setting of AF with RVR  . Chicken pox as a child  . Chronic anticoagulation    Pradaxa  . Chronic systolic heart failure ([place])    a. echo 11/13: Ef 40-45%, diff HK, mod MR, mod LAE, mild RVE, mod RAE, small effusion;   b. TEE 11/13:  EF 35-40%, no LAA clot; Echo 2/14 shows normal EF  . Depression with anxiety 06/29/2011  . Dysrhythmia   . Hypertension   . 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 Migraine 06/29/2011  . Obesity 06/29/2011  . Snoring    patient needs sleep study - has declined    Past Surgical History:  Procedure Laterality Date  . CARDIOVERSION  11/30/2011   Procedure: CARDIOVERSION;  Surgeon: Thayer Headings, MD;  Location: [place];  Service: Cardiovascular;  Laterality: N/A;  . CARDIOVERSION  12/03/2011   Procedure: CARDIOVERSION;  Surgeon: Jolaine Artist, MD;  Location: [place];  Service: Cardiovascular;  Laterality: N/A;  . COLECTOMY  12/07/2015   partial  . COLON SURGERY    . COLOSTOMY REVERSAL  12/07/2015  . COLOSTOMY REVERSAL N/A 12/07/2015   Procedure: OPEN REVERSAL OF COLOSTOMY WITH PARTIAL SIGMOID COLECTOMY;  Surgeon: Leighton Ruff, MD;  Location: [place];  Service: General;  Laterality: N/A;  . HERNIA REPAIR    . INCISIONAL HERNIA REPAIR  12/07/2015  . INCISIONAL HERNIA REPAIR N/A  12/07/2015   Procedure: INCISIONAL HERNIA REPAIR;  Surgeon: Leighton Ruff, MD;  Location: [place];  Service: General;  Laterality: N/A;  . IR GENERIC HISTORICAL  08/11/2015   IR RADIOLOGIST EVAL & MGMT 08/11/2015 Markus Daft, MD [facility]  . IR GENERIC HISTORICAL  11/18/2015   IR CATHETER TUBE CHANGE 11/18/2015 Corrie Mckusick, DO [facility]  . IR GENERIC HISTORICAL  10/15/2015   IR RADIOLOGIST EVAL & MGMT 10/15/2015 Jacqulynn Cadet, MD [facility]  . IR GENERIC HISTORICAL  01/27/2016   IR RADIOLOGIST EVAL & MGMT 01/27/2016 Ascencion Dike, PA-C [facility]  . IR  GENERIC HISTORICAL  01/28/2016   IR CATHETER TUBE CHANGE 01/28/2016 Markus Daft, MD [facility]  . IR GENERIC HISTORICAL  02/17/2016   IR RADIOLOGIST EVAL & MGMT 02/17/2016 Ascencion Dike, PA-C [facility]  . LAPAROTOMY N/A 01/17/2015   Procedure: EXPLORATORY LAPAROTOMY WITH LEFT COLECTOMY AND COLOSTOMY;  Surgeon: Rolm Bookbinder, MD;  Location: [place];  Service: General;  Laterality: N/A;  . [place]  11/2015  . TEE WITHOUT CARDIOVERSION  11/30/2011   Procedure: TRANSESOPHAGEAL ECHOCARDIOGRAM (TEE);  Surgeon: Thayer Headings, MD;  Location: [place];  Service: Cardiovascular;  Laterality: N/A;  . TONSILLECTOMY      Current Outpatient Medications  Medication Sig Dispense Refill  . acetaminophen (TYLENOL) 500 MG tablet Take 1,000 mg by mouth every 6 (six) hours as needed (pain).    [name] digoxin (LANOXIN) 0.125 MG tablet TAKE 1 TABLET (0.125 MG TOTAL) BY MOUTH DAILY. 30 tablet 8  . diltiazem (CARDIZEM CD) 180 MG 24 hr capsule TAKE ONE CAPSULE BY MOUTH TWICE A DAY 60 capsule 11  . furosemide (LASIX) 40 MG tablet TAKE 1 TABLET (40 MG TOTAL) BY MOUTH DAILY AS NEEDED FOR EDEMA. 30 tablet 11  . lisinopril (PRINIVIL,ZESTRIL) 5 MG tablet TAKE 1 TABLET (5 MG TOTAL) BY MOUTH DAILY. 30 tablet 8  . metoprolol tartrate (LOPRESSOR) 100 MG tablet Take 1 tablet (100 mg total) by mouth 2 (two) times daily. 60 tablet 5  . Multiple Vitamin (MULTIVITAMIN WITH MINERALS) TABS tablet Take 1 tablet by mouth daily.    Alveda Reasons 20 MG TABS tablet TAKE 1 TABLET BY MOUTH DAILY WITH SUPPER 30 tablet 5   No current facility-administered medications for this visit.     Allergies:   Contrast media [iodinated diagnostic agents]   Social History:  The patient  reports that he quit smoking about 2 years ago. His smoking use included cigarettes. He has a 20.00 pack-year smoking history. He has never used smokeless tobacco. He reports that he does not drink alcohol or use drugs.    Family History:  The patient's family history includes Alcohol abuse in his father and paternal grandfather; Aneurysm in his father; Cancer in his maternal grandmother and paternal grandmother; Diabetes in his maternal grandfather and paternal grandmother; Hypertension in his father and mother; Parkinsonism in his maternal grandfather.  ROS:  Please see the history of present illness.   All other systems are reviewed and otherwise negative.  PHYSICAL EXAM:  VS:  There were no vitals taken for this visit. BMI: There is no height or weight on file to calculate BMI. Well nourished, well developed, in no acute distress  HEENT: normocephalic, atraumatic  Neck: no JVD, carotid bruits or masses Cardiac:  iIRRR; no significant murmurs, no rubs, or gallops Lungs:  CTA b/l, no wheezing, rhonchi or rales  Abd: soft, large ventral hernia MS: no  deformity or atrophy Ext: no edema  Skin: warm and dry, no rash Neuro:  No gross deficits appreciated Psych: euthymic mood, full affect   EKG: AFib, 85bpm, baseline interference, no acute looking changes  03/31/16: TTE Study Conclusions - Left ventricle: The cavity size was moderately dilated. Systolic   function was normal. The estimated ejection fraction was in the   range of 60% to 65%. Wall motion was normal; there were no   regional wall motion abnormalities. - Aortic valve: Transvalvular velocity was within the normal range.   There was no stenosis. There was no regurgitation. - Mitral valve: Transvalvular velocity was within the normal range.   There was no evidence for stenosis. There was mild regurgitation. - Left atrium: The appendage was severely dilated. 49mm - Right ventricle: The cavity size was mildly dilated. Wall   thickness was normal. Systolic function was normal. - Right atrium: The appendage was moderately dilated. - Atrial septum: No defect or patent foramen ovale was identified   by color flow Doppler. - Tricuspid valve:  There was trivial regurgitation. - Pulmonary arteries: Systolic pressure was within the normal   range. PA peak pressure: 30 mm Hg (S).  Recent Labs: 10/12/2016: BUN 13; Creatinine, Ser 0.95; Hemoglobin 16.3; Platelets 313; Potassium 4.6; Sodium 139  No results found for requested labs within last 8760 hours.   CrCl cannot be calculated (Patient's most recent lab result is older than the maximum 21 days allowed.).   Wt Readings from Last 3 Encounters:  10/12/16 (!) 308 lb (139.7 kg)  05/11/16 283 lb (128.4 kg)  01/08/16 255 lb (115.7 kg)     Other studies reviewed: Additional studies/records reviewed today include: summarized above  ASSESSMENT AND PLAN:  1. Persistent AFib     CHA2DS2Vasc is at least 3, on Xarelto, appropriately dosed by labs in Sept     He reports compliance with his a/c     No symptoms of AF, he is unaware     CVR  2. NICM     Echo remains with improved LVEF 03/2016     Exam does not suggest fluid OL     No changes to his exertional capacity, he does not feel limited  3. HTN     Looks OK, no changes  4. Pending abdominal hernia repair, ?not yet scheduled, he thinks in the next week or so     His cardiac surgical; risk score is elevated given his hx of CHF     His Duke activity score though is very good with >7 METS     No symptoms to suggest any new/acute cardiac issues, his echo last year with normalized LVEF     No cardiac contraindication to surgery, no need for new pre-operative testing  He reports surgeon's office will be reaching out to our office once scheduled to arrange lovenox bridge given hx of embolic event agree that bridging is needed.  Our pharmacy department will handle this once surgeon's office reaches out.    Disposition: No changes today, will see him back in 3 mo, revisit perhaps stopping his dig.  Current medicines are reviewed at length with the patient today.  The patient did not have any concerns regarding  medicines.  Haywood Lasso, PA-C 04/09/2017 12:41 PM     [address] [place] [address] [phone] (office)  [phone] (fax)

## 2017-04-10 DIAGNOSIS — I10 Essential (primary) hypertension: Secondary | ICD-10-CM | POA: Diagnosis not present

## 2017-04-10 DIAGNOSIS — K432 Incisional hernia without obstruction or gangrene: Secondary | ICD-10-CM | POA: Diagnosis not present

## 2017-04-12 ENCOUNTER — Ambulatory Visit: Payer: BLUE CROSS/BLUE SHIELD | Admitting: Physician Assistant

## 2017-04-12 DIAGNOSIS — Z01818 Encounter for other preprocedural examination: Secondary | ICD-10-CM

## 2017-04-12 DIAGNOSIS — I4819 Other persistent atrial fibrillation: Secondary | ICD-10-CM

## 2017-04-12 DIAGNOSIS — K769 Liver disease, unspecified: Secondary | ICD-10-CM | POA: Diagnosis not present

## 2017-04-12 DIAGNOSIS — I481 Persistent atrial fibrillation: Secondary | ICD-10-CM

## 2017-04-12 DIAGNOSIS — I10 Essential (primary) hypertension: Secondary | ICD-10-CM | POA: Diagnosis not present

## 2017-04-12 DIAGNOSIS — K429 Umbilical hernia without obstruction or gangrene: Secondary | ICD-10-CM | POA: Diagnosis not present

## 2017-04-12 DIAGNOSIS — I428 Other cardiomyopathies: Secondary | ICD-10-CM

## 2017-04-12 NOTE — Patient Instructions (Signed)
Medication Instructions:    Your physician recommends that you continue on your current medications as directed. Please refer to the Current Medication list given to you today.   If you need a refill on your cardiac medications before your next appointment, please call your pharmacy.  Labwork: NONE ORDERED  TODAY    Testing/Procedures: NONE ORDERED  TODAY    Follow-Up:  3  MONTHS WITH  URSUY OR NEXT AVAILABLE   Any Other Special Instructions Will Be Listed Below (If Applicable).

## 2017-04-18 NOTE — Addendum Note (Signed)
Addended by: Claude Manges on: 04/18/2017 04:15 PM   Modules accepted: Orders

## 2017-04-20 ENCOUNTER — Telehealth: Payer: Self-pay | Admitting: Internal Medicine

## 2017-04-20 NOTE — Telephone Encounter (Signed)
New message        [facility] Pre-operative Risk Assessment    Request for surgical clearance:  1. What type of surgery is being performed? HERNIA REPAIR  2. When is this surgery scheduled? 04/27/2017  3. What type of clearance is required (medical clearance vs. Pharmacy clearance to hold med vs. Both)? BOTH  4. Are there any medications that need to be held prior to surgery and how long? [place]  5. Practice name and name of physician performing surgery? DR  JULIOS REYES  6. What is your office phone and fax number? PHONE [phone], FAX [phone] ATTN: ANGELA  7. Anesthesia type (None, local, MAC, general) ? [place] 04/20/2017, 12:50 PM  _________________________________________________________________   (provider comments below)

## 2017-04-21 NOTE — Telephone Encounter (Signed)
   Primary Cardiologist: Thompson Grayer, MD  Chart reviewed as part of pre-operative protocol coverage. Given past medical history and time since last visit, based on ACC/AHA guidelines, William Fischer would be at acceptable risk for the planned procedure without further cardiovascular testing.   Will route this to a pharmacy to address holding the Xarelto and if he needs a Lovenox bridge.  I will route this recommendation to the requesting party via Epic fax function and remove from pre-op pool.  Please call with questions.  Rosaria Ferries, PA-C 04/21/2017, 5:38 PM

## 2017-04-23 NOTE — Telephone Encounter (Signed)
Patient with diagnosis of atrial fibrillation on Xarelto for anticoagulation.    Procedure: hernia repair Date of procedure: 04/27/17  CHADS2-VASc score of  3 (CHF, stroke/tia x 2 - mesenteric embolism 03/2015 , )  CrCl 193.7 Platelet count 313  We do not bridge for patients using Xarelto.  He should hold Xarelto dose the day prior to procedure and restart as soon as able, at discretion of surgeon.

## 2017-04-24 ENCOUNTER — Telehealth: Payer: Self-pay | Admitting: Physician Assistant

## 2017-04-24 NOTE — Telephone Encounter (Signed)
Levada Dy ( Dr.Reyes office ) is asking if the clearance can be faxed again to the her , Did not receive the fax . Please fax at [phone]

## 2017-04-24 NOTE — Telephone Encounter (Signed)
Information faxed to DR  JULIOS REYES

## 2017-04-24 NOTE — Telephone Encounter (Signed)
New message   Levada Dy is calling from Dr.Reyes office and she  verbalized they did not receive the fax for surgical clearance  please refax to [phone] attention Levada Dy   Please leave call and leave vm if she do not answer to confirm fax

## 2017-04-24 NOTE — Telephone Encounter (Signed)
Re-faxed.

## 2017-04-27 DIAGNOSIS — K469 Unspecified abdominal hernia without obstruction or gangrene: Secondary | ICD-10-CM | POA: Diagnosis not present

## 2017-04-27 DIAGNOSIS — I10 Essential (primary) hypertension: Secondary | ICD-10-CM | POA: Diagnosis not present

## 2017-04-27 DIAGNOSIS — K432 Incisional hernia without obstruction or gangrene: Secondary | ICD-10-CM | POA: Diagnosis not present

## 2017-04-28 DIAGNOSIS — I10 Essential (primary) hypertension: Secondary | ICD-10-CM | POA: Diagnosis not present

## 2017-04-28 DIAGNOSIS — K432 Incisional hernia without obstruction or gangrene: Secondary | ICD-10-CM | POA: Diagnosis not present

## 2017-04-29 DIAGNOSIS — K432 Incisional hernia without obstruction or gangrene: Secondary | ICD-10-CM | POA: Diagnosis not present

## 2017-04-29 DIAGNOSIS — I10 Essential (primary) hypertension: Secondary | ICD-10-CM | POA: Diagnosis not present

## 2017-04-30 DIAGNOSIS — K432 Incisional hernia without obstruction or gangrene: Secondary | ICD-10-CM | POA: Diagnosis not present

## 2017-04-30 DIAGNOSIS — I10 Essential (primary) hypertension: Secondary | ICD-10-CM | POA: Diagnosis not present

## 2017-05-01 DIAGNOSIS — K432 Incisional hernia without obstruction or gangrene: Secondary | ICD-10-CM | POA: Diagnosis not present

## 2017-05-01 DIAGNOSIS — I10 Essential (primary) hypertension: Secondary | ICD-10-CM | POA: Diagnosis not present

## 2017-05-02 DIAGNOSIS — K432 Incisional hernia without obstruction or gangrene: Secondary | ICD-10-CM | POA: Diagnosis not present

## 2017-05-02 DIAGNOSIS — I10 Essential (primary) hypertension: Secondary | ICD-10-CM | POA: Diagnosis not present

## 2017-06-05 DIAGNOSIS — L02211 Cutaneous abscess of abdominal wall: Secondary | ICD-10-CM | POA: Diagnosis not present

## 2017-06-22 ENCOUNTER — Encounter (INDEPENDENT_AMBULATORY_CARE_PROVIDER_SITE_OTHER): Payer: Self-pay

## 2017-06-22 ENCOUNTER — Ambulatory Visit: Payer: BLUE CROSS/BLUE SHIELD | Admitting: Physician Assistant

## 2017-06-22 VITALS — BP 124/80 | HR 63 | Ht 70.0 in | Wt 312.0 lb

## 2017-06-22 DIAGNOSIS — I10 Essential (primary) hypertension: Secondary | ICD-10-CM | POA: Diagnosis not present

## 2017-06-22 DIAGNOSIS — I481 Persistent atrial fibrillation: Secondary | ICD-10-CM | POA: Diagnosis not present

## 2017-06-22 DIAGNOSIS — I428 Other cardiomyopathies: Secondary | ICD-10-CM

## 2017-06-22 DIAGNOSIS — I4819 Other persistent atrial fibrillation: Secondary | ICD-10-CM

## 2017-06-22 NOTE — Progress Notes (Signed)
Cardiology Office Note Date:  06/22/2017  Patient ID:  William Fischer, DOB [birth year], MRN [id number] PCP:  System, Pcp Not In  Electrophysiologist:  Dr. Rayann Heman   Chief Complaint: planned follow up  History of Present Illness: William Fischer is a 52 y.o. 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  He underwent colostomy reversal and hernia repair in November, unfortunately complicated by repeat infections, admitted 01/07/17 with an abdominal abscess requiring repeat drains, last was 1/11/8 removed 02/17/16.  He comes in today to be seen for Dr. Rayann Heman, last seen by him in September, at that time doing well, planned for labs and f/u visit and discussed discontinuation of his dig, though doing well and continued.  I saw him in March this year, unfortunately he had an increasing ventral hernia that has become very large and needed surgery, was not yet scheduled but planned in the next couple weeks.  He was very uncomfortable .  He had gained weight but felt this is had been slow and steady, and related it to his abdomen, he denies any DOE or SOB, denies symptoms of PND or orthopnea, he had not perceived or noted any swelling/edema.  He denied any CP, no particular cardiac awareness at all, unaware of his AFib.  No dizziness, near syncope or syncope. He had his abd CT this AM, had to drink "2gallons of that chalky contrast" thinks this is part of the weight and has left him a little nauseous that morning.  His RCRI score is 6.6 given his hx of CM?CHF/intra-abdominal surgery/surgery type, DASI score is 36.7,  7.25METS.  He had elevated cardiac surgical risk, though given DUKE score, no further cardiac testing was recommended pre-op.  Given hx of embolic event was planned for lovenox bridging that once his  surgery was scheduled planned for our [facility] to manage.  He is feeling very well.  He had his hernia repair, was an extensive surgery, did have to take additional antibiotics post op for infection though he says are completed and is healing up well.  He was bridged to his surgery with lovenox, had no cardiac issues during or after surgery, is back in his Xarelto.  No bleeding or signs of bleeding.  He denies nay cardiac awareness, no CP, palpitations or SOB, no symptoms of orthopnea or PND, he is back to his usual activities, denies exertional intolerances.  No dizziness, near syncope or syncope.  Past Medical History:  Diagnosis Date  . Allergy to IVP dye   . Anticoagulated by anticoagulation treatment 01/17/2016   Present on admit  . Atrial fibrillation ([place])    admx 11/13 with acute sCHF in setting of RVR  => a. failed DCCV x 2; b. Pradaxa started;  c. failed sotalol  . Cardiomyopathy ([place])    likely tachy mediated in setting of AF with RVR  . Chicken pox as a child  . Chronic anticoagulation    Pradaxa  . Chronic systolic heart failure ([place])    a. echo 11/13: Ef 40-45%, diff HK, mod MR, mod LAE, mild RVE, mod RAE, small effusion;   b. TEE 11/13:  EF 35-40%, no LAA clot; Echo 2/14 shows normal EF  . Depression with anxiety 06/29/2011  . Dysrhythmia   . Hypertension   . 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  Migraine 06/29/2011  . Obesity 06/29/2011  . Snoring    patient needs sleep study - has declined    Past Surgical History:  Procedure Laterality Date  . CARDIOVERSION  11/30/2011   Procedure: CARDIOVERSION;  Surgeon: Thayer Headings, MD;  Location: [place];  Service: Cardiovascular;  Laterality: N/A;  . CARDIOVERSION  12/03/2011   Procedure: CARDIOVERSION;  Surgeon: Jolaine Artist, MD;  Location: [place];  Service: Cardiovascular;  Laterality: N/A;  . COLECTOMY  12/07/2015   partial  . COLON SURGERY    . COLOSTOMY REVERSAL  12/07/2015  . COLOSTOMY REVERSAL N/A 12/07/2015    Procedure: OPEN REVERSAL OF COLOSTOMY WITH PARTIAL SIGMOID COLECTOMY;  Surgeon: Leighton Ruff, MD;  Location: [place];  Service: General;  Laterality: N/A;  . HERNIA REPAIR    . INCISIONAL HERNIA REPAIR  12/07/2015  . INCISIONAL HERNIA REPAIR N/A 12/07/2015   Procedure: INCISIONAL HERNIA REPAIR;  Surgeon: Leighton Ruff, MD;  Location: [place];  Service: General;  Laterality: N/A;  . IR GENERIC HISTORICAL  08/11/2015   IR RADIOLOGIST EVAL & MGMT 08/11/2015 Markus Daft, MD [facility]  . IR GENERIC HISTORICAL  11/18/2015   IR CATHETER TUBE CHANGE 11/18/2015 Corrie Mckusick, DO [facility]  . IR GENERIC HISTORICAL  10/15/2015   IR RADIOLOGIST EVAL & MGMT 10/15/2015 Jacqulynn Cadet, MD [facility]  . IR GENERIC HISTORICAL  01/27/2016   IR RADIOLOGIST EVAL & MGMT 01/27/2016 Ascencion Dike, PA-C [facility]  . IR GENERIC HISTORICAL  01/28/2016   IR CATHETER TUBE CHANGE 01/28/2016 Markus Daft, MD [facility]  . IR GENERIC HISTORICAL  02/17/2016   IR RADIOLOGIST EVAL & MGMT 02/17/2016 Ascencion Dike, PA-C [facility]  . LAPAROTOMY N/A 01/17/2015   Procedure: EXPLORATORY LAPAROTOMY WITH LEFT COLECTOMY AND COLOSTOMY;  Surgeon: Rolm Bookbinder, MD;  Location: [place];  Service: General;  Laterality: N/A;  . [place]  11/2015  . TEE WITHOUT CARDIOVERSION  11/30/2011   Procedure: TRANSESOPHAGEAL ECHOCARDIOGRAM (TEE);  Surgeon: Thayer Headings, MD;  Location: [place];  Service: Cardiovascular;  Laterality: N/A;  . TONSILLECTOMY      Current Outpatient Medications  Medication Sig Dispense Refill  . acetaminophen (TYLENOL) 500 MG tablet Take 1,000 mg by mouth every 6 (six) hours as needed (pain).    [name] digoxin (LANOXIN) 0.125 MG tablet TAKE 1 TABLET (0.125 MG TOTAL) BY MOUTH DAILY. 30 tablet 8  . diltiazem (CARDIZEM CD) 180 MG 24 hr capsule TAKE ONE CAPSULE BY MOUTH TWICE A DAY 60 capsule 11  . furosemide (LASIX) 40 MG tablet TAKE 1 TABLET (40 MG TOTAL)  BY MOUTH DAILY AS NEEDED FOR EDEMA. 30 tablet 11  . lisinopril (PRINIVIL,ZESTRIL) 5 MG tablet TAKE 1 TABLET (5 MG TOTAL) BY MOUTH DAILY. 30 tablet 8  . metoprolol tartrate (LOPRESSOR) 100 MG tablet Take 1 tablet (100 mg total) by mouth 2 (two) times daily. 60 tablet 5  . Multiple Vitamin (MULTIVITAMIN WITH MINERALS) TABS tablet Take 1 tablet by mouth daily.    Alveda Reasons 20 MG TABS tablet TAKE 1 TABLET BY MOUTH DAILY WITH SUPPER 30 tablet 5   No current facility-administered medications for this visit.     Allergies:   Contrast media [iodinated diagnostic agents]   Social History:  The patient  reports that he quit smoking about 2 years ago. His smoking use included cigarettes. He has a 20.00 pack-year smoking history. He has never used smokeless tobacco. He reports that  he does not drink alcohol or use drugs.   Family History:  The patient's family history includes Alcohol abuse in his father and paternal grandfather; Aneurysm in his father; Cancer in his maternal grandmother and paternal grandmother; Diabetes in his maternal grandfather and paternal grandmother; Hypertension in his father and mother; Parkinsonism in his maternal grandfather.  ROS:  Please see the history of present illness.   All other systems are reviewed and otherwise negative.  PHYSICAL EXAM:  VS:  BP 124/80   Pulse 63   Ht 5\' 10"  (1.778 m)   Wt (!) 312 lb (141.5 kg)   BMI 44.77 kg/m  BMI: Body mass index is 44.77 kg/m. Well nourished, well developed, in no acute distress  HEENT: normocephalic, atraumatic  Neck: no JVD, carotid bruits or masses Cardiac:  iRRR; no significant murmurs, no rubs, or gallops Lungs:   CTA b/l, no wheezing, rhonchi or rales  Abd: soft MS: no deformity or atrophy Ext: no edema  Skin: warm and dry, no rash Neuro:  No gross deficits appreciated Psych: euthymic mood, full affect   EKG: not done today  03/31/16: TTE Study Conclusions - Left ventricle: The cavity size was moderately  dilated. Systolic   function was normal. The estimated ejection fraction was in the   range of 60% to 65%. Wall motion was normal; there were no   regional wall motion abnormalities. - Aortic valve: Transvalvular velocity was within the normal range.   There was no stenosis. There was no regurgitation. - Mitral valve: Transvalvular velocity was within the normal range.   There was no evidence for stenosis. There was mild regurgitation. - Left atrium: The appendage was severely dilated. 17mm - Right ventricle: The cavity size was mildly dilated. Wall   thickness was normal. Systolic function was normal. - Right atrium: The appendage was moderately dilated. - Atrial septum: No defect or patent foramen ovale was identified   by color flow Doppler. - Tricuspid valve: There was trivial regurgitation. - Pulmonary arteries: Systolic pressure was within the normal   range. PA peak pressure: 30 mm Hg (S).  Recent Labs: 10/12/2016: BUN 13; Creatinine, Ser 0.95; Hemoglobin 16.3; Platelets 313; Potassium 4.6; Sodium 139  No results found for requested labs within last 8760 hours.   CrCl cannot be calculated (Patient's most recent lab result is older than the maximum 21 days allowed.).   Wt Readings from Last 3 Encounters:  06/22/17 (!) 312 lb (141.5 kg)  04/12/17 (!) 321 lb (145.6 kg)  10/12/16 (!) 308 lb (139.7 kg)     Other studies reviewed: Additional studies/records reviewed today include: summarized above  ASSESSMENT AND PLAN:  1. Persistent AFib     CHA2DS2Vasc is at least 3, on Xarelto, appropriately dosed 05/02/17 Creat 0.80 (Calc Cr.Cl 224, H/H 14/46)     He reports compliance with his a/c     No symptoms of AF, he is unaware     CVR  Discussed weaning off the digoxin and rational.  He is very reluctant to make any changes, feeling so well and would prefer making no changes.  He feels best to stay on it and see Dr. Rayann Heman at next follow to discuss further.  2. NICM     Echo  remains with improved LVEF 03/2016     Exam does not suggest fluid OL     No changes to his exertional capacity, he does not feel limited  3. HTN     Looks OK, no changes  Disposition: 6 months with Dr. Rayann Heman, sooner if needed.   Current medicines are reviewed at length with the patient today.  The patient did not have any concerns regarding medicines.  Haywood Lasso, PA-C 06/22/2017 3:44 PM     [address] [place] [address] [phone] (office)  [phone] (fax)

## 2017-06-22 NOTE — Patient Instructions (Signed)
Medication Instructions:   Your physician recommends that you continue on your current medications as directed. Please refer to the Current Medication list given to you today.    If you need a refill on your cardiac medications before your next appointment, please call your pharmacy.  Labwork: NONE ORDERED  TODAY    Testing/Procedures: NONE ORDERED  TODAY    Follow-Up:  Your physician wants you to follow-up in:  IN  6 MONTHS WITH DR ALLRED   You will receive a reminder letter in the mail two months in advance. If you don't receive a letter, please call our office to schedule the follow-up appointment.      Any Other Special Instructions Will Be Listed Below (If Applicable).                                                                                                                                                   

## 2017-06-30 ENCOUNTER — Other Ambulatory Visit: Payer: Self-pay | Admitting: Physician Assistant

## 2017-06-30 ENCOUNTER — Telehealth: Payer: Self-pay | Admitting: Physician Assistant

## 2017-06-30 DIAGNOSIS — Z79899 Other long term (current) drug therapy: Secondary | ICD-10-CM

## 2017-06-30 NOTE — Telephone Encounter (Signed)
Called and spoke with patient to call on Monday to schedule a lab appointment for digoxin level.  Order is in place.  He was instructed to not take his digoxin the day of his lab test until after the blood was drawn.  He is feeling well, stated understanding and will call the office Monday to make the appointment.  Tommye Standard, PA-C

## 2017-07-10 ENCOUNTER — Other Ambulatory Visit: Payer: BLUE CROSS/BLUE SHIELD | Admitting: *Deleted

## 2017-07-10 DIAGNOSIS — Z79899 Other long term (current) drug therapy: Secondary | ICD-10-CM | POA: Diagnosis not present

## 2017-07-11 LAB — DIGOXIN LEVEL: Digoxin, Serum: <0.4 ng/mL — ABNORMAL LOW (ref 0.5–0.9)

## 2017-07-14 ENCOUNTER — Telehealth: Payer: Self-pay | Admitting: *Deleted

## 2017-07-14 NOTE — Telephone Encounter (Signed)
CALLED PT UNABLE TO LEAVE MESSAGE. 

## 2017-07-14 NOTE — Telephone Encounter (Signed)
-----   Message from [facility] [place] sent at 07/12/2017  2:03 PM EDT ----- Dig level is on lower side, this is better then it being high.  No changes.  Please make sure he has f/u scheduled with Dr. Rayann Heman 6 months from my visit.  Thanks renee

## 2017-09-25 ENCOUNTER — Other Ambulatory Visit: Payer: Self-pay | Admitting: Internal Medicine

## 2017-09-25 ENCOUNTER — Other Ambulatory Visit: Payer: Self-pay | Admitting: Physician Assistant

## 2017-09-25 DIAGNOSIS — I482 Chronic atrial fibrillation, unspecified: Secondary | ICD-10-CM

## 2017-09-26 NOTE — Telephone Encounter (Signed)
Xarelto 20mg  refill request received; pt is 52 yrs old, 141.5kg, Crea-0.95 on 10/12/16, last seen by Tommye Standard on 06/22/17, CrCl-186.64ml/min; will send in refill to requested pharmacy. Pt has a recall for October and note placed on refill regarding this.

## 2017-11-13 ENCOUNTER — Observation Stay (HOSPITAL_BASED_OUTPATIENT_CLINIC_OR_DEPARTMENT_OTHER)
Admission: EM | Admit: 2017-11-13 | Discharge: 2017-11-13 | Discharge status: Against Medical Advice | Payer: BLUE CROSS/BLUE SHIELD | Attending: Internal Medicine | Admitting: Internal Medicine

## 2017-11-13 ENCOUNTER — Emergency Department (HOSPITAL_BASED_OUTPATIENT_CLINIC_OR_DEPARTMENT_OTHER): Payer: BLUE CROSS/BLUE SHIELD

## 2017-11-13 ENCOUNTER — Other Ambulatory Visit: Payer: Self-pay

## 2017-11-13 ENCOUNTER — Encounter (HOSPITAL_BASED_OUTPATIENT_CLINIC_OR_DEPARTMENT_OTHER): Payer: Self-pay | Admitting: *Deleted

## 2017-11-13 DIAGNOSIS — Z7901 Long term (current) use of anticoagulants: Secondary | ICD-10-CM | POA: Insufficient documentation

## 2017-11-13 DIAGNOSIS — F418 Other specified anxiety disorders: Secondary | ICD-10-CM | POA: Diagnosis not present

## 2017-11-13 DIAGNOSIS — Z79899 Other long term (current) drug therapy: Secondary | ICD-10-CM | POA: Insufficient documentation

## 2017-11-13 DIAGNOSIS — Z888 Allergy status to other drugs, medicaments and biological substances status: Secondary | ICD-10-CM | POA: Diagnosis not present

## 2017-11-13 DIAGNOSIS — Z87891 Personal history of nicotine dependence: Secondary | ICD-10-CM | POA: Insufficient documentation

## 2017-11-13 DIAGNOSIS — R42 Dizziness and giddiness: Secondary | ICD-10-CM

## 2017-11-13 DIAGNOSIS — R0602 Shortness of breath: Secondary | ICD-10-CM | POA: Diagnosis not present

## 2017-11-13 DIAGNOSIS — I5022 Chronic systolic (congestive) heart failure: Secondary | ICD-10-CM | POA: Insufficient documentation

## 2017-11-13 DIAGNOSIS — N390 Urinary tract infection, site not specified: Secondary | ICD-10-CM | POA: Diagnosis not present

## 2017-11-13 DIAGNOSIS — W57XXXS Bitten or stung by nonvenomous insect and other nonvenomous arthropods, sequela: Secondary | ICD-10-CM

## 2017-11-13 DIAGNOSIS — S80861S Insect bite (nonvenomous), right lower leg, sequela: Secondary | ICD-10-CM

## 2017-11-13 DIAGNOSIS — I482 Chronic atrial fibrillation, unspecified: Secondary | ICD-10-CM | POA: Insufficient documentation

## 2017-11-13 DIAGNOSIS — R55 Syncope and collapse: Secondary | ICD-10-CM | POA: Insufficient documentation

## 2017-11-13 DIAGNOSIS — N3000 Acute cystitis without hematuria: Secondary | ICD-10-CM | POA: Diagnosis not present

## 2017-11-13 DIAGNOSIS — I110 Hypertensive heart disease with heart failure: Principal | ICD-10-CM | POA: Insufficient documentation

## 2017-11-13 LAB — CBC WITH DIFFERENTIAL/PLATELET
Abs Immature Granulocytes: 0.04 10*3/uL (ref 0.00–0.07)
BASOS ABS: 0.1 10*3/uL (ref 0.0–0.1)
BASOS PCT: 1 %
EOS PCT: 2 %
Eosinophils Absolute: 0.2 10*3/uL (ref 0.0–0.5)
HCT: 49.9 % (ref 39.0–52.0)
HEMOGLOBIN: 16 g/dL (ref 13.0–17.0)
Immature Granulocytes: 0 %
LYMPHS PCT: 21 %
Lymphs Abs: 1.9 10*3/uL (ref 0.7–4.0)
MCH: 30 pg (ref 26.0–34.0)
MCHC: 32.1 g/dL (ref 30.0–36.0)
MCV: 93.6 fL (ref 80.0–100.0)
Monocytes Absolute: 1 10*3/uL (ref 0.1–1.0)
Monocytes Relative: 11 %
NRBC: 0 % (ref 0.0–0.2)
Neutro Abs: 5.9 10*3/uL (ref 1.7–7.7)
Neutrophils Relative %: 65 %
PLATELETS: 289 10*3/uL (ref 150–400)
RBC: 5.33 MIL/uL (ref 4.22–5.81)
RDW: 14.3 % (ref 11.5–15.5)
WBC: 9.1 10*3/uL (ref 4.0–10.5)

## 2017-11-13 LAB — COMPREHENSIVE METABOLIC PANEL
ALBUMIN: 3.7 g/dL (ref 3.5–5.0)
ALT: 20 U/L (ref 0–44)
ANION GAP: 9 (ref 5–15)
AST: 18 U/L (ref 15–41)
Alkaline Phosphatase: 71 U/L (ref 38–126)
BUN: 18 mg/dL (ref 6–20)
CHLORIDE: 101 mmol/L (ref 98–111)
CO2: 28 mmol/L (ref 22–32)
Calcium: 9 mg/dL (ref 8.9–10.3)
Creatinine, Ser: 1.17 mg/dL (ref 0.61–1.24)
GFR calc non Af Amer: >60 mL/min (ref >60)
GLUCOSE: 119 mg/dL — AB (ref 70–99)
POTASSIUM: 4.7 mmol/L (ref 3.5–5.1)
SODIUM: 138 mmol/L (ref 135–145)
TOTAL PROTEIN: 7.4 g/dL (ref 6.5–8.1)
Total Bilirubin: 0.5 mg/dL (ref 0.3–1.2)

## 2017-11-13 LAB — URINALYSIS, ROUTINE W REFLEX MICROSCOPIC
Bilirubin Urine: NEGATIVE
Glucose, UA: NEGATIVE mg/dL
HGB URINE DIPSTICK: NEGATIVE
KETONES UR: NEGATIVE mg/dL
Nitrite: NEGATIVE
PROTEIN: NEGATIVE mg/dL
Specific Gravity, Urine: 1.025 (ref 1.005–1.030)
pH: 6 (ref 5.0–8.0)

## 2017-11-13 LAB — LIPASE, BLOOD: Lipase: 28 U/L (ref 11–51)

## 2017-11-13 LAB — BRAIN NATRIURETIC PEPTIDE: B Natriuretic Peptide: 39.6 pg/mL (ref 0.0–100.0)

## 2017-11-13 LAB — URINALYSIS, MICROSCOPIC (REFLEX)

## 2017-11-13 LAB — MRSA PCR SCREENING: MRSA BY PCR: NEGATIVE

## 2017-11-13 LAB — DIGOXIN LEVEL: Digoxin Level: <0.2 ng/mL — ABNORMAL LOW (ref 0.8–2.0)

## 2017-11-13 LAB — TROPONIN I

## 2017-11-13 LAB — MAGNESIUM: MAGNESIUM: 2.1 mg/dL (ref 1.7–2.4)

## 2017-11-13 MED ORDER — SODIUM CHLORIDE 0.9% FLUSH: 3.0000 mL | Freq: Two times a day (BID) | INTRAVENOUS | Status: DC

## 2017-11-13 MED ORDER — SODIUM CHLORIDE 0.9 % IV SOLN
INTRAVENOUS | Status: DC | PRN
  Administered 2017-11-13: 250 mL via INTRAVENOUS

## 2017-11-13 MED ORDER — INFLUENZA VAC SPLIT QUAD 0.5 ML IM SUSY: 0.5000 mL | PREFILLED_SYRINGE | INTRAMUSCULAR | Status: DC

## 2017-11-13 MED ORDER — SODIUM CHLORIDE 0.9 % IV BOLUS
500.0000 mL | Freq: Once | INTRAVENOUS | Status: AC
  Administered 2017-11-13: 500 mL via INTRAVENOUS

## 2017-11-13 MED ORDER — SODIUM CHLORIDE 0.9 % IV SOLN
1.0000 g | Freq: Once | INTRAVENOUS | Status: AC
  Administered 2017-11-13: 1 g via INTRAVENOUS
  Filled 2017-11-13: qty 10

## 2017-11-13 MED ORDER — SODIUM CHLORIDE 0.9 % IV SOLN: 1.0000 g | INTRAVENOUS | Status: DC

## 2017-11-13 NOTE — Progress Notes (Signed)
Patient states he would like to leave against medical advice.  Provided education on importance of staying overnight for monitoring per MD recommendation and completion of ECHO in AM per orders.  MD paged, awaiting response at this time.

## 2017-11-13 NOTE — ED Provider Notes (Signed)
[facility] EMERGENCY DEPARTMENT Provider Note   CSN: [id number] Arrival date & time: 11/13/17  1103     History   Chief Complaint Chief Complaint  Patient presents with  . Dizziness    HPI ALI MOHL is a 52 y.o. male.  The history is provided by the patient and medical records. No language interpreter was used.  Loss of Consciousness   This is a new problem. The current episode started more than 2 days ago. The problem occurs daily. The problem has not changed since onset.He lost consciousness for a period of less than one minute. The problem is associated with normal activity. Associated symptoms include diaphoresis, light-headedness, malaise/fatigue, nausea and palpitations. Pertinent negatives include abdominal pain, back pain, bladder incontinence, chest pain, confusion, congestion, dizziness, fever, headaches and vomiting. He has tried nothing for the symptoms. The treatment provided no relief.    Past Medical History:  Diagnosis Date  . Allergy to IVP dye   . Anticoagulated by anticoagulation treatment 01/17/2016   Present on admit  . Atrial fibrillation ([place])    admx 11/13 with acute sCHF in setting of RVR  => a. failed DCCV x 2; b. Pradaxa started;  c. failed sotalol  . Cardiomyopathy ([place])    likely tachy mediated in setting of AF with RVR  . Chicken pox as a child  . Chronic anticoagulation    Pradaxa  . Chronic systolic heart failure ([place])    a. echo 11/13: Ef 40-45%, diff HK, mod MR, mod LAE, mild RVE, mod RAE, small effusion;   b. TEE 11/13:  EF 35-40%, no LAA clot; Echo 2/14 shows normal EF  . Depression with anxiety 06/29/2011  . Dysrhythmia   . Hypertension   . 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 Migraine 06/29/2011  . Obesity 06/29/2011  . Snoring    patient needs sleep study - has declined    Patient Active Problem List   Diagnosis Date Noted  . Anticoagulated by anticoagulation treatment 01/17/2016  . Intra-abdominal infection  11/17/2015  . NICM (nonischemic cardiomyopathy) ([place]) 08/03/2015  . 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 Chronic atrial fibrillation   . Sepsis ([place]) 07/25/2015  . Prediabetes 04/16/2015  . Normocytic anemia 04/16/2015  . 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 Abdominal abscess   . Septic shock ([place]) 04/12/2015  . Colostomy in place [facility]) 03/27/2015  . Acute venous embolism and thrombosis of deep vessels of proximal lower extremity ([place]) [I82.4Y9] 02/11/2015  . Long term (current) use of anticoagulants [Z79.01] 02/11/2015  . Monitoring for long-term anticoagulant use 02/11/2015  . Intra-abdominal abscess ([place])   . Bowel perforation ([place])   . Pressure ulcer 01/26/2015  . Pleural effusion   . Perforated bowel ([place])   . Diverticulitis of colon with perforation 01/14/2015  . AKI (acute kidney injury) ([place])   . Chronic systolic heart failure ([place]) 12/08/2011  . Morbid obesity ([place]) 12/03/2011  . Depression with anxiety 06/29/2011  . Obesity 06/29/2011    Past Surgical History:  Procedure Laterality Date  . CARDIOVERSION  11/30/2011   Procedure: CARDIOVERSION;  Surgeon: Thayer Headings, MD;  Location: [facility];  Service: Cardiovascular;  Laterality: N/A;  . CARDIOVERSION  12/03/2011   Procedure: CARDIOVERSION;  Surgeon: Jolaine Artist, MD;  Location: [place];  Service: Cardiovascular;  Laterality: N/A;  . COLECTOMY  12/07/2015   partial  . COLON SURGERY    . COLOSTOMY REVERSAL  12/07/2015  . COLOSTOMY  REVERSAL N/A 12/07/2015   Procedure: OPEN REVERSAL OF COLOSTOMY WITH PARTIAL SIGMOID COLECTOMY;  Surgeon: Leighton Ruff, MD;  Location: [facility];  Service: General;  Laterality: N/A;  . HERNIA REPAIR    . INCISIONAL HERNIA REPAIR  12/07/2015  . INCISIONAL HERNIA REPAIR N/A 12/07/2015   Procedure: INCISIONAL HERNIA REPAIR;  Surgeon: Leighton Ruff, MD;  Location: [place];  Service: General;  Laterality: N/A;  . IR GENERIC  HISTORICAL  08/11/2015   IR RADIOLOGIST EVAL & MGMT 08/11/2015 Markus Daft, MD [facility]  . IR GENERIC HISTORICAL  11/18/2015   IR CATHETER TUBE CHANGE 11/18/2015 Corrie Mckusick, DO [facility]  . IR GENERIC HISTORICAL  10/15/2015   IR RADIOLOGIST EVAL & MGMT 10/15/2015 Jacqulynn Cadet, MD [facility]  . IR GENERIC HISTORICAL  01/27/2016   IR RADIOLOGIST EVAL & MGMT 01/27/2016 Ascencion Dike, PA-C [facility]  . IR GENERIC HISTORICAL  01/28/2016   IR CATHETER TUBE CHANGE 01/28/2016 Markus Daft, MD [facility]  . IR GENERIC HISTORICAL  02/17/2016   IR RADIOLOGIST EVAL & MGMT 02/17/2016 Ascencion Dike, PA-C [facility]  . LAPAROTOMY N/A 01/17/2015   Procedure: EXPLORATORY LAPAROTOMY WITH LEFT COLECTOMY AND COLOSTOMY;  Surgeon: Rolm Bookbinder, MD;  Location: [facility];  Service: General;  Laterality: N/A;  . [place]  11/2015  . TEE WITHOUT CARDIOVERSION  11/30/2011   Procedure: TRANSESOPHAGEAL ECHOCARDIOGRAM (TEE);  Surgeon: Thayer Headings, MD;  Location: [facility];  Service: Cardiovascular;  Laterality: N/A;  . TONSILLECTOMY          Home Medications    Prior to Admission medications   Medication Sig Start Date End Date Taking? Authorizing Provider  acetaminophen (TYLENOL) 500 MG tablet Take 1,000 mg by mouth every 6 (six) hours as needed (pain).    [provider]  digoxin (LANOXIN) 0.125 MG tablet TAKE 1 TABLET (0.125 MG TOTAL) BY MOUTH DAILY. 01/04/17   Richardson Dopp T, PA-C  diltiazem (CARDIZEM CD) 180 MG 24 hr capsule TAKE ONE CAPSULE BY MOUTH TWICE A DAY 09/26/17   Allred, Jeneen Rinks, MD  furosemide (LASIX) 40 MG tablet TAKE 1 TABLET (40 MG TOTAL) BY MOUTH DAILY AS NEEDED FOR EDEMA. 07/04/16   Allred, Jeneen Rinks, MD  lisinopril (PRINIVIL,ZESTRIL) 5 MG tablet TAKE 1 TABLET (5 MG TOTAL) BY MOUTH DAILY. 09/26/17   Richardson Dopp T, PA-C  metoprolol tartrate (LOPRESSOR) 100 MG tablet Take 1 tablet (100 mg total) by mouth 2 (two) times  daily. 03/27/17   Allred, Jeneen Rinks, MD  Multiple Vitamin (MULTIVITAMIN WITH MINERALS) TABS tablet Take 1 tablet by mouth daily.    [provider]  XARELTO 20 MG TABS tablet TAKE 1 TABLET BY MOUTH DAILY WITH SUPPER 09/26/17   Allred, Jeneen Rinks, MD    Family History Family History  Problem Relation Age of Onset  . Hypertension Mother   . Hypertension Father   . Aneurysm Father   . Alcohol abuse Father   . Cancer Maternal Grandmother        brain  . Diabetes Maternal Grandfather        type 2  . Parkinsonism Maternal Grandfather   . Cancer Paternal Grandmother        breast  . Diabetes Paternal Grandmother   . Alcohol abuse Paternal Grandfather     Social History Social History   Tobacco Use  . Smoking status: Former Smoker    Packs/day: 1.00    Years: 20.00    Pack years:  20.00    Types: Cigarettes    Last attempt to quit: 07/10/2014    Years since quitting: 3.3  . Smokeless tobacco: Never Used  Substance Use Topics  . Alcohol use: No    Alcohol/week: 0.0 standard drinks  . Drug use: No     Allergies   Contrast media [iodinated diagnostic agents]   Review of Systems Review of Systems  Constitutional: Positive for chills, diaphoresis, fatigue and malaise/fatigue. Negative for fever.  HENT: Negative for congestion.   Respiratory: Positive for cough and shortness of breath. Negative for choking, chest tightness and wheezing.   Cardiovascular: Positive for palpitations and syncope. Negative for chest pain and leg swelling.  Gastrointestinal: Positive for nausea. Negative for abdominal pain, constipation, diarrhea and vomiting.  Genitourinary: Positive for frequency. Negative for bladder incontinence, dysuria, flank pain and hematuria.       Darker and foul smelling urine   Musculoskeletal: Negative for back pain, neck pain and neck stiffness.  Neurological: Positive for syncope and light-headedness. Negative for dizziness, speech difficulty and headaches.    Psychiatric/Behavioral: Negative for agitation and confusion.  All other systems reviewed and are negative.    Physical Exam Updated Vital Signs BP 131/79 (BP Location: Right Arm)   Pulse 80   Temp 98.4 F (36.9 C) (Oral)   Resp 18   Ht 5\' 10"  (1.778 m)   Wt (!) 143.8 kg   SpO2 98%   BMI 45.48 kg/m   Physical Exam  Constitutional: He is oriented to person, place, and time. He appears well-developed and well-nourished. No distress.  HENT:  Head: Normocephalic and atraumatic.  Mouth/Throat: Oropharynx is clear and moist. No oropharyngeal exudate.  Eyes: Pupils are equal, round, and reactive to light. Conjunctivae and EOM are normal.  Neck: Normal range of motion. Neck supple.  Cardiovascular: Normal rate. An irregularly irregular rhythm present.  No murmur heard. Pulmonary/Chest: Effort normal. No respiratory distress. He has no wheezes. He has rales. He exhibits no tenderness.  Abdominal: Soft. He exhibits no distension. There is no tenderness.  Musculoskeletal: He exhibits edema (mild). He exhibits no tenderness.  Neurological: He is alert and oriented to person, place, and time. No sensory deficit. He exhibits normal muscle tone.  Skin: Skin is warm and dry. He is not diaphoretic. No erythema. No pallor.  Psychiatric: He has a normal mood and affect.  Nursing note and vitals reviewed.    ED Treatments / Results  Labs (all labs ordered are listed, but only abnormal results are displayed) Labs Reviewed  COMPREHENSIVE METABOLIC PANEL - Abnormal; Notable for the following components:      Result Value   Glucose, Bld 119 (*)    All other components within normal limits  URINALYSIS, ROUTINE W REFLEX MICROSCOPIC - Abnormal; Notable for the following components:   Leukocytes, UA TRACE (*)    All other components within normal limits  DIGOXIN LEVEL - Abnormal; Notable for the following components:   Digoxin Level <0.2 (*)    All other components within normal limits   URINALYSIS, MICROSCOPIC (REFLEX) - Abnormal; Notable for the following components:   Bacteria, UA FEW (*)    All other components within normal limits  URINE CULTURE  CBC WITH DIFFERENTIAL/PLATELET  LIPASE, BLOOD  TROPONIN I  BRAIN NATRIURETIC PEPTIDE  MAGNESIUM    EKG EKG Interpretation  Date/Time:  Monday November 13 2017 11:13:13 EDT Ventricular Rate:  68 PR Interval:    QRS Duration: 78 QT Interval:  398 QTC Calculation: 423  R Axis:   17 Text Interpretation:  Atrial fibrillation Low voltage QRS Cannot rule out Anteroseptal infarct , age undetermined Abnormal ECG When compared to prior, Afib present without RVR.  No STEMI Confirmed by Antony Blackbird [phone]) on 11/13/2017 11:23:25 AM   Radiology Dg Chest 2 View  Result Date: 11/13/2017 CLINICAL DATA:  52 year old male with dizziness, shortness of breath and syncope for the past 3 days EXAM: CHEST - 2 VIEW COMPARISON:  Prior chest x-ray 12/08/2015 FINDINGS: Stable cardiac and mediastinal contours. Vascular congestion without overt pulmonary edema. Stable bibasilar atelectasis versus scarring. No pneumothorax, pleural effusion or focal airspace consolidation. Bronchitic changes remain chronic. No acute osseous abnormality. IMPRESSION: No active cardiopulmonary disease. Electronically Signed   By: Jacqulynn Cadet M.D.   On: 11/13/2017 12:47    Procedures Procedures (including critical care time)  Medications Ordered in ED Medications  0.9 %  sodium chloride infusion (250 mLs Intravenous New Bag/Given 11/13/17 1434)  sodium chloride 0.9 % bolus 500 mL (500 mLs Intravenous New Bag/Given 11/13/17 1427)  cefTRIAXone (ROCEPHIN) 1 g in sodium chloride 0.9 % 100 mL IVPB (1 g Intravenous New Bag/Given 11/13/17 1435)     Initial Impression / Assessment and Plan / ED Course  I have reviewed the triage vital signs and the nursing notes.  Pertinent labs & imaging results that were available during my care of the patient were  reviewed by me and considered in my medical decision making (see chart for details).     William Fischer is a 52 y.o. male with a past medical history significant for chronic A. 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  Patient reports that for the last 3 days he has been having cough and shortness of breath.  He reports a sputum and his cough.  No hemoptysis.  He reports he is always on Xarelto and has been taking his medication as directed for prior DVT and his chronic A. fib.  He says that he has had no chest pain during the last few days but each day for the last 3 days he has had a syncopal episode.  He reports he feels palpitations, heart racing, his vision goes dark he feels dizziness and then syncopized this.  He reports he is gotten diaphoretic with it.  He currently denies any chest pain.  He denies any nausea or vomiting.  He denies other complaints on arrival.  Patient reports that he has not taken his Lasix in the last 2 months because he thought he did not need it.  On exam, minimal edema in legs.  He had crackles in the base of his lungs.  Chest was nontender.  Abdomen was nontender.  Back was nontender.  Symmetric pulses in upper and lower extremities.  No focal neurologic deficit seen.  Patient and A. fib on monitor.  Clinically I am concerned with the patient having history of CHF with 3 syncopal episodes in the last 3 days.  I am also concerned with his crackles in the lungs is reported not taking Lasix that he could have a heart failure worsening.  With his urinary symptoms will check for UTI and with his  Cough will check for pneumonia.  Anticipate admission for high risk syncope with congestive heart failure and preceding  palpitations before his episodes.  2:19 PM Patient's urine shows leukocytes and bacteria.  Given the patient's foul smell, darkened urine, lack of epithelial cells and symptoms, patient will be treated for UTI.  Other laboratory testing was overall reassuring.  Digoxin level has not returned.  Troponin negative.  BNP not elevated.  Magnesium normal.  Chest x-ray shows no coronary disease.    Given the urinary tract infection, Rocephin was ordered.  Patient given a small amount of fluids as his blood pressure is now in the 90s.  Patient still feels short of breath however with lack of fluid on his x-ray and low BNP, we feel safe giving him some fluids.    Due to the patient's 3 syncopal episodes over the last 3 days and his history of heart failure on long-term digoxin, we are concerned about the safety of him going home with his soft blood pressures.  Cardiology will be called and anticipate patient will be admitted for further syncope observation and management.  Cardiology agreed with admission to medicine service and they will see in consultation.  They agreed with antibiotics for the UTI.  Patient be admitted to medicine for further management of syncopal episodes with A. fib and heart failure.  He will also be treated for UTI.  Patient is awaiting a bed at [facility].     Final Clinical Impressions(s) / ED Diagnoses   Final diagnoses:  Syncope, unspecified syncope type  Shortness of breath  Acute cystitis without hematuria    ED Discharge Orders    None      Clinical Impression: 1. Syncope, unspecified syncope type   2. Shortness of breath   3. Acute cystitis without hematuria     Disposition: Admit  This note was prepared with assistance of Dragon voice recognition software. Occasional wrong-word or sound-a-like substitutions may have occurred due to the inherent limitations of voice recognition software.     Tegeler, Gwenyth Allegra, MD 11/13/17 [phone]

## 2017-11-13 NOTE — ED Notes (Signed)
ED Provider at bedside. 

## 2017-11-13 NOTE — Plan of Care (Signed)

## 2017-11-13 NOTE — Progress Notes (Signed)
Patient signed AMA form.  Provided reinforcement education on importance of compliance with medical advice and recommendation.  Reinforcement of plan of care to observe patient overnight for patient safety due to syncopal events and plan for ECHO in am. Patient support person arrived, patient states he has all belongings, IV removed, cardiac monitoring removed, patient left with support person.

## 2017-11-13 NOTE — ED Triage Notes (Signed)
Pt reports feeling dizzy, sob and "passing out" at times x 3 days. Pt states he has hx of a fib and feels like he is having it now. Pt went to a clinic and was sent here for further eval. Denies cp and sob

## 2017-11-13 NOTE — Progress Notes (Signed)
Called from [facility] regarding this 52yo obese male patient with h/o probable OSA (declined sleep study); menteric ischemia from embolus in 2016, s/p colectomy; HTN; chronic systolic CHF; and afib on Pradaxa presenting with 3 syncopal episodes in 3 days.  Cardiology agrees with need for observation/consult.  Has not taken Lasix in 2 months.  Palpitations, SOB, passes out for a minute or so.  +urinary symptoms - concern for clinical UTI and given Rocephin.  Dig level is undetectable.  BNP 39.  Mild edema.  ?arrythmia as the source.  BP 95, given 500 cc IVF.  Will observe on telemetry.  Carlyon Shadow, M.D.

## 2017-11-13 NOTE — ED Notes (Signed)
[facility] arrived to transport to [facility]

## 2017-11-13 NOTE — Plan of Care (Signed)
  Problem: Education: Goal: Knowledge of General Education information will improve Description Including pain rating scale, medication(s)/side effects and non-pharmacologic comfort measures 11/13/2017 2305 by Hulan Amato, RN Outcome: Completed/Met 11/13/2017 2025 by Hulan Amato, RN Outcome: Progressing   Problem: Health Behavior/Discharge Planning: Goal: Ability to manage health-related needs will improve 11/13/2017 2305 by Hulan Amato, RN Outcome: Completed/Met 11/13/2017 2025 by Hulan Amato, RN Outcome: Progressing   Problem: Clinical Measurements: Goal: Ability to maintain clinical measurements within normal limits will improve 11/13/2017 2305 by Hulan Amato, RN Outcome: Completed/Met 11/13/2017 2025 by Hulan Amato, RN Outcome: Progressing Goal: Will remain free from infection 11/13/2017 2305 by Hulan Amato, RN Outcome: Completed/Met 11/13/2017 2025 by Hulan Amato, RN Outcome: Progressing Goal: Diagnostic test results will improve 11/13/2017 2305 by Hulan Amato, RN Outcome: Completed/Met 11/13/2017 2025 by Hulan Amato, RN Outcome: Progressing Goal: Respiratory complications will improve 11/13/2017 2305 by Hulan Amato, RN Outcome: Completed/Met 11/13/2017 2025 by Hulan Amato, RN Outcome: Progressing Goal: Cardiovascular complication will be avoided 11/13/2017 2305 by Hulan Amato, RN Outcome: Completed/Met 11/13/2017 2025 by Hulan Amato, RN Outcome: Progressing   Problem: Activity: Goal: Risk for activity intolerance will decrease 11/13/2017 2305 by Hulan Amato, RN Outcome: Completed/Met 11/13/2017 2025 by Hulan Amato, RN Outcome: Progressing   Problem: Nutrition: Goal: Adequate nutrition will be maintained 11/13/2017 2305 by Hulan Amato, RN Outcome: Completed/Met 11/13/2017 2025 by Hulan Amato, RN Outcome: Progressing   Problem: Coping: Goal: Level of anxiety will decrease 11/13/2017  2305 by Hulan Amato, RN Outcome: Completed/Met 11/13/2017 2025 by Hulan Amato, RN Outcome: Progressing   Problem: Elimination: Goal: Will not experience complications related to bowel motility 11/13/2017 2305 by Hulan Amato, RN Outcome: Completed/Met 11/13/2017 2025 by Hulan Amato, RN Outcome: Progressing Goal: Will not experience complications related to urinary retention 11/13/2017 2305 by Hulan Amato, RN Outcome: Completed/Met 11/13/2017 2025 by Hulan Amato, RN Outcome: Progressing   Problem: Pain Managment: Goal: General experience of comfort will improve 11/13/2017 2305 by Hulan Amato, RN Outcome: Completed/Met 11/13/2017 2025 by Hulan Amato, RN Outcome: Progressing   Problem: Safety: Goal: Ability to remain free from injury will improve 11/13/2017 2305 by Hulan Amato, RN Outcome: Completed/Met 11/13/2017 2025 by Hulan Amato, RN Outcome: Progressing   Problem: Skin Integrity: Goal: Risk for impaired skin integrity will decrease 11/13/2017 2305 by Hulan Amato, RN Outcome: Completed/Met 11/13/2017 2025 by Hulan Amato, RN Outcome: Progressing   Careplan completed due to patient leaving hospital against medical advice.

## 2017-11-13 NOTE — H&P (Signed)
History and Physical    LANKFORD GUTZMER CLE:[id number] DOB: Nov 07, 1965 DOA: 11/13/2017  PCP: System, Pcp Not In  Patient coming from: Home  I have personally briefly reviewed patient's old medical records in [place]  Chief Complaint: Syncope  HPI: William Fischer is a 52 y.o. 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  Not taken lasix in past 2 months.  Does have dysuria.  No CP with episodes.  Has had cough and SOB for past 3 days.   ED Course: BNP 39.  UA shows trace LE, 6-10 WBC.  Trop neg.  CXR neg.  Dig level undetectable.  Put on rocephin.   BP low at 95 systolic, given 944 cc IVF.   Review of Systems: As per HPI otherwise 10 point review of systems negative.   Past Medical History:  Diagnosis Date  . Allergy to IVP dye   . Atrial fibrillation ([place])    admx 11/13 with acute sCHF in setting of RVR  => a. failed DCCV x 2; b. Pradaxa started;  c. failed sotalol  . Chicken pox as a child  . Chronic anticoagulation    Pradaxa  . Chronic systolic heart failure ([place])    a. echo 11/13: Ef 40-45%, diff HK, mod MR, mod LAE, mild RVE, mod RAE, small effusion;   b. TEE 11/13:  EF 35-40%, no LAA clot; Echo 2/14 shows normal EF  . Depression with anxiety 06/29/2011  . Hypertension   . 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 Migraine 06/29/2011  . Obesity 06/29/2011  . Snoring    patient needs sleep study - has declined    Past Surgical History:  Procedure Laterality Date  . CARDIOVERSION  11/30/2011   Procedure: CARDIOVERSION;  Surgeon: Thayer Headings, MD;  Location: [place];  Service: Cardiovascular;  Laterality: N/A;  . CARDIOVERSION  12/03/2011   Procedure: CARDIOVERSION;  Surgeon: Jolaine Artist, MD;  Location: [place];  Service: Cardiovascular;  Laterality: N/A;  . COLECTOMY   12/07/2015   partial  . COLON SURGERY    . COLOSTOMY REVERSAL  12/07/2015  . COLOSTOMY REVERSAL N/A 12/07/2015   Procedure: OPEN REVERSAL OF COLOSTOMY WITH PARTIAL SIGMOID COLECTOMY;  Surgeon: Leighton Ruff, MD;  Location: [place];  Service: General;  Laterality: N/A;  . HERNIA REPAIR    . INCISIONAL HERNIA REPAIR  12/07/2015  . INCISIONAL HERNIA REPAIR N/A 12/07/2015   Procedure: INCISIONAL HERNIA REPAIR;  Surgeon: Leighton Ruff, MD;  Location: [place];  Service: General;  Laterality: N/A;  . IR GENERIC HISTORICAL  08/11/2015   IR RADIOLOGIST EVAL & MGMT 08/11/2015 Markus Daft, MD [facility]  . IR GENERIC HISTORICAL  11/18/2015   IR CATHETER TUBE CHANGE 11/18/2015 Corrie Mckusick, DO [facility]  . IR GENERIC HISTORICAL  10/15/2015   IR RADIOLOGIST EVAL & MGMT 10/15/2015 Jacqulynn Cadet, MD [facility]  . IR GENERIC HISTORICAL  01/27/2016   IR RADIOLOGIST EVAL & MGMT 01/27/2016 Ascencion Dike, PA-C [facility]  . IR GENERIC HISTORICAL  01/28/2016   IR CATHETER TUBE CHANGE 01/28/2016 Markus Daft, MD [facility]  . IR GENERIC HISTORICAL  02/17/2016   IR RADIOLOGIST EVAL & MGMT 02/17/2016 Ascencion Dike, PA-C [facility]  . LAPAROTOMY N/A 01/17/2015   Procedure: EXPLORATORY LAPAROTOMY WITH LEFT COLECTOMY AND COLOSTOMY;  Surgeon: Rolm Bookbinder, MD;  Location[facility];  Service: General;  Laterality: N/A;  . [place]  11/2015  . TEE WITHOUT CARDIOVERSION  11/30/2011   Procedure: TRANSESOPHAGEAL ECHOCARDIOGRAM (TEE);  Surgeon: Thayer Headings, MD;  Location: [facility];  Service: Cardiovascular;  Laterality: N/A;  . TONSILLECTOMY       reports that he quit smoking about 3 years ago. His smoking use included cigarettes. He has a 20.00 pack-year smoking history. He has never used smokeless tobacco. He reports that he does not drink alcohol or use drugs.  Allergies  Allergen Reactions  . Contrast Media [Iodinated Diagnostic Agents] Rash    a  diffuse macular rash after CTA chest  Pt was premedicated with 125mg  IV Solumedrol, 50mg  IV Benadryl 1 hr prior to CTexam, and tolerated procedure without any difficulties. 11/28/11 Also same pre med protocol observed on 02/22/15 and pt tolerated the procedure well    Family History  Problem Relation Age of Onset  . Hypertension Mother   . Hypertension Father   . Aneurysm Father   . Alcohol abuse Father   . Cancer Maternal Grandmother        brain  . Diabetes Maternal Grandfather        type 2  . Parkinsonism Maternal Grandfather   . Cancer Paternal Grandmother        breast  . Diabetes Paternal Grandmother   . Alcohol abuse Paternal Grandfather      Prior to Admission medications   Medication Sig Start Date End Date Taking? Authorizing Provider  acetaminophen (TYLENOL) 500 MG tablet Take 1,000 mg by mouth every 6 (six) hours as needed (pain).    [provider]  digoxin (LANOXIN) 0.125 MG tablet TAKE 1 TABLET (0.125 MG TOTAL) BY MOUTH DAILY. 01/04/17   Richardson Dopp T, PA-C  diltiazem (CARDIZEM CD) 180 MG 24 hr capsule TAKE ONE CAPSULE BY MOUTH TWICE A DAY 09/26/17   Allred, Jeneen Rinks, MD  furosemide (LASIX) 40 MG tablet TAKE 1 TABLET (40 MG TOTAL) BY MOUTH DAILY AS NEEDED FOR EDEMA. 07/04/16   Allred, Jeneen Rinks, MD  lisinopril (PRINIVIL,ZESTRIL) 5 MG tablet TAKE 1 TABLET (5 MG TOTAL) BY MOUTH DAILY. 09/26/17   Richardson Dopp T, PA-C  metoprolol tartrate (LOPRESSOR) 100 MG tablet Take 1 tablet (100 mg total) by mouth 2 (two) times daily. 03/27/17   Allred, Jeneen Rinks, MD  Multiple Vitamin (MULTIVITAMIN WITH MINERALS) TABS tablet Take 1 tablet by mouth daily.    [provider]  XARELTO 20 MG TABS tablet TAKE 1 TABLET BY MOUTH DAILY WITH SUPPER 09/26/17   Thompson Grayer, MD    Physical Exam: Vitals:   11/13/17 1603 11/13/17 1630 11/13/17 1820 11/13/17 1942  BP: 110/69 109/83 (!) 133/94 119/74  Pulse: 84 76    Resp: 18 (!) 21  (!) 24  Temp:   98.4 F (36.9 C) 98.5 F (36.9 C)    TempSrc:   Oral Oral  SpO2: 95% 97%  93%  Weight:   (!) 148.4 kg   Height:   5\' 11"  (1.803 m)     Constitutional: NAD, calm, comfortable Eyes: PERRL, lids and conjunctivae normal ENMT: Mucous membranes are moist. Posterior pharynx clear of any exudate or lesions.Normal dentition.  Neck: normal, supple, no masses, no thyromegaly Respiratory: clear to auscultation bilaterally, no wheezing, no crackles. Normal respiratory effort. No accessory muscle use.  Cardiovascular: Irr, irr. Abdomen: no tenderness, no masses palpated. No hepatosplenomegaly. Bowel sounds positive.  Musculoskeletal: no  clubbing / cyanosis. No joint deformity upper and lower extremities. Good ROM, no contractures. Normal muscle tone.  Skin: Small <1cm bump on posterior aspect of L calf Neurologic: CN 2-12 grossly intact. Sensation intact, DTR normal. Strength 5/5 in all 4.  Psychiatric: Normal judgment and insight. Alert and oriented x 3. Normal mood.    Labs on Admission: I have personally reviewed following labs and imaging studies  CBC: Recent Labs  Lab 11/13/17 1220  WBC 9.1  NEUTROABS 5.9  HGB 16.0  HCT 49.9  MCV 93.6  PLT 740   Basic Metabolic Panel: Recent Labs  Lab 11/13/17 1220  NA 138  K 4.7  CL 101  CO2 28  GLUCOSE 119*  BUN 18  CREATININE 1.17  CALCIUM 9.0  MG 2.1   GFR: Estimated Creatinine Clearance: 111.6 mL/min (by C-G formula based on SCr of 1.17 mg/dL). Liver Function Tests: Recent Labs  Lab 11/13/17 1220  AST 18  ALT 20  ALKPHOS 71  BILITOT 0.5  PROT 7.4  ALBUMIN 3.7   Recent Labs  Lab 11/13/17 1220  LIPASE 28   No results for input(s): AMMONIA in the last 168 hours. Coagulation Profile: No results for input(s): INR, PROTIME in the last 168 hours. Cardiac Enzymes: Recent Labs  Lab 11/13/17 1220  TROPONINI <0.03   BNP (last 3 results) No results for input(s): PROBNP in the last 8760 hours. HbA1C: No results for input(s): HGBA1C in the last 72  hours. CBG: No results for input(s): GLUCAP in the last 168 hours. Lipid Profile: No results for input(s): CHOL, HDL, LDLCALC, TRIG, CHOLHDL, LDLDIRECT in the last 72 hours. Thyroid Function Tests: No results for input(s): TSH, T4TOTAL, FREET4, T3FREE, THYROIDAB in the last 72 hours. Anemia Panel: No results for input(s): VITAMINB12, FOLATE, FERRITIN, TIBC, IRON, RETICCTPCT in the last 72 hours. Urine analysis:    Component Value Date/Time   COLORURINE YELLOW 11/13/2017 [place] 11/13/2017 1248   LABSPEC 1.025 11/13/2017 1248   PHURINE 6.0 11/13/2017 1248   GLUCOSEU NEGATIVE 11/13/2017 1248   HGBUR NEGATIVE 11/13/2017 1248   BILIRUBINUR NEGATIVE 11/13/2017 1248   KETONESUR NEGATIVE 11/13/2017 1248   PROTEINUR NEGATIVE 11/13/2017 1248   NITRITE NEGATIVE 11/13/2017 1248   LEUKOCYTESUR TRACE (A) 11/13/2017 1248    Radiological Exams on Admission: Dg Chest 2 View  Result Date: 11/13/2017 CLINICAL DATA:  52 year old male with dizziness, shortness of breath and syncope for the past 3 days EXAM: CHEST - 2 VIEW COMPARISON:  Prior chest x-ray 12/08/2015 FINDINGS: Stable cardiac and mediastinal contours. Vascular congestion without overt pulmonary edema. Stable bibasilar atelectasis versus scarring. No pneumothorax, pleural effusion or focal airspace consolidation. Bronchitic changes remain chronic. No acute osseous abnormality. IMPRESSION: No active cardiopulmonary disease. Electronically Signed   By: Jacqulynn Cadet M.D.   On: 11/13/2017 12:47    EKG: Independently reviewed.  Assessment/Plan Principal Problem:   Syncope and collapse Active Problems:   Chronic systolic heart failure (HCC)   Chronic atrial fibrillation   Acute lower UTI   Tick bite of calf, right, sequela    1. Syncope and collapse - 1. Cards seeing patient 2. Syncope pathway 3. Tele monitor 4. 2d echo in AM 2. Chronic systolic CHF - 1. Clinically not fluid overloaded, will hold off on  ordering lasix for now. 2. BP running on low side, will also hold lisinopril 3. A.Fib - 1. Continue metoprolol, cardizem, xarelto, digoxin once doses are verified by pharm. 4. ? UTI - 1. Continue  rocephin for now given dysuria symptoms 2. Culture pending 5. Tick bite of R calf - 1. Occurred months-years ago 2. Think the Tick head may still be subq causing the persistent skin changes at the site. 3. Call surg?? Tomorrow.  DVT prophylaxis: Continue Xarelto Code Status: Full Family Communication: No family in room Disposition Plan: Home after admit Consults called: Cards called prior to transfer Admission status: Place in obs     Autry Droege, [place] [facility] Pager [phone] Only works nights!  If 7AM-7PM, please contact the primary day team physician taking care of patient  [email or web address] Password [facility]  11/13/2017, 8:47 PM

## 2017-11-13 NOTE — ED Triage Notes (Signed)
ekg performed while pt being triaged

## 2017-11-14 DIAGNOSIS — R42 Dizziness and giddiness: Secondary | ICD-10-CM

## 2017-11-14 LAB — URINE CULTURE: Culture: NO GROWTH

## 2017-11-14 NOTE — Consult Note (Signed)
Cardiology Consultation:   Patient ID: William Fischer MRN: [id number]; DOB: 07-15-1965  Admit date: 11/13/2017 Date of Consult: 11/14/2017  Primary Care Provider: System, Pcp Not In Primary Cardiologist: Thompson Grayer, MD  Primary Electrophysiologist:  None    Patient Profile:   William Fischer is a 52 y.o. male with a hx of atrial fibrillation who is being seen today for the evaluation of dizzy spells at the request of internal medicine.  History of Present Illness:   Mr. [name] is a 52 year old gentleman with a history of atrial fibrillation on chronic anticoagulation, hypertension, mesenteric embolus, and snoring with likely OSA declining sleep study.  He presents today after several episodes of dizzy spells.  This was originally thought to be syncope, therefore cardiology was involved.  The patient describes his symptoms as feeling like he is on a circular ride spinning around, where he can see everything moving around him.  He describes several "mini pass out", however denies presyncope or syncope.  These episodes seem more consistent with dizziness leading to nausea and vomiting.  He denies chest pain, chest pressure, dyspnea at rest or with exertion, palpitations, PND, orthopnea, or leg swelling. Denies true syncope or presyncope.  Describes dizziness or lightheadedness.  Describes snoring and declined to be evaluated for sleep apnea.   Past Medical History:  Diagnosis Date  . Allergy to IVP dye   . Atrial fibrillation ([place])    admx 11/13 with acute sCHF in setting of RVR  => a. failed DCCV x 2; b. Pradaxa started;  c. failed sotalol  . Chicken pox as a child  . Chronic anticoagulation    Pradaxa  . Chronic systolic heart failure ([place])    a. echo 11/13: Ef 40-45%, diff HK, mod MR, mod LAE, mild RVE, mod RAE, small effusion;   b. TEE 11/13:  EF 35-40%, no LAA clot; Echo 2/14 shows normal EF  . Depression with anxiety 06/29/2011  . Hypertension   . Mesenteric embolus  ([place]) 12/2014  . Migraine 06/29/2011  . Obesity 06/29/2011  . Snoring    patient needs sleep study - has declined    Past Surgical History:  Procedure Laterality Date  . CARDIOVERSION  11/30/2011   Procedure: CARDIOVERSION;  Surgeon: Thayer Headings, MD;  Location: [place];  Service: Cardiovascular;  Laterality: N/A;  . CARDIOVERSION  12/03/2011   Procedure: CARDIOVERSION;  Surgeon: Jolaine Artist, MD;  Location: [place];  Service: Cardiovascular;  Laterality: N/A;  . COLECTOMY  12/07/2015   partial  . COLON SURGERY    . COLOSTOMY REVERSAL  12/07/2015  . COLOSTOMY REVERSAL N/A 12/07/2015   Procedure: OPEN REVERSAL OF COLOSTOMY WITH PARTIAL SIGMOID COLECTOMY;  Surgeon: Leighton Ruff, MD;  Location: [place];  Service: General;  Laterality: N/A;  . HERNIA REPAIR    . INCISIONAL HERNIA REPAIR  12/07/2015  . INCISIONAL HERNIA REPAIR N/A 12/07/2015   Procedure: INCISIONAL HERNIA REPAIR;  Surgeon: Leighton Ruff, MD;  Location: [place];  Service: General;  Laterality: N/A;  . IR GENERIC HISTORICAL  08/11/2015   IR RADIOLOGIST EVAL & MGMT 08/11/2015 Markus Daft, MD [facility]  . IR GENERIC HISTORICAL  11/18/2015   IR CATHETER TUBE CHANGE 11/18/2015 Corrie Mckusick, DO [facility]  . IR GENERIC HISTORICAL  10/15/2015   IR RADIOLOGIST EVAL & MGMT 10/15/2015 Jacqulynn Cadet, MD [facility]  . IR GENERIC HISTORICAL  01/27/2016   IR RADIOLOGIST EVAL & MGMT 01/27/2016 Ascencion Dike, PA-C [facility]  .  IR GENERIC HISTORICAL  01/28/2016   IR CATHETER TUBE CHANGE 01/28/2016 Markus Daft, MD [facility]  . IR GENERIC HISTORICAL  02/17/2016   IR RADIOLOGIST EVAL & MGMT 02/17/2016 Ascencion Dike, PA-C [facility]  . LAPAROTOMY N/A 01/17/2015   Procedure: EXPLORATORY LAPAROTOMY WITH LEFT COLECTOMY AND COLOSTOMY;  Surgeon: Rolm Bookbinder, MD;  Location: [place];  Service: General;  Laterality: N/A;  . [place]  11/2015  . TEE WITHOUT CARDIOVERSION   11/30/2011   Procedure: TRANSESOPHAGEAL ECHOCARDIOGRAM (TEE);  Surgeon: Thayer Headings, MD;  Location: [place];  Service: Cardiovascular;  Laterality: N/A;  . TONSILLECTOMY       Home Medications:  Prior to Admission medications   Medication Sig Start Date End Date Taking? Authorizing Provider  acetaminophen (TYLENOL) 500 MG tablet Take 1,000 mg by mouth every 6 (six) hours as needed (pain).    [provider]  digoxin (LANOXIN) 0.125 MG tablet TAKE 1 TABLET (0.125 MG TOTAL) BY MOUTH DAILY. 01/04/17   Richardson Dopp T, PA-C  diltiazem (CARDIZEM CD) 180 MG 24 hr capsule TAKE ONE CAPSULE BY MOUTH TWICE A DAY 09/26/17   Allred, Jeneen Rinks, MD  furosemide (LASIX) 40 MG tablet TAKE 1 TABLET (40 MG TOTAL) BY MOUTH DAILY AS NEEDED FOR EDEMA. 07/04/16   Allred, Jeneen Rinks, MD  lisinopril (PRINIVIL,ZESTRIL) 5 MG tablet TAKE 1 TABLET (5 MG TOTAL) BY MOUTH DAILY. 09/26/17   Richardson Dopp T, PA-C  metoprolol tartrate (LOPRESSOR) 100 MG tablet Take 1 tablet (100 mg total) by mouth 2 (two) times daily. 03/27/17   Allred, Jeneen Rinks, MD  Multiple Vitamin (MULTIVITAMIN WITH MINERALS) TABS tablet Take 1 tablet by mouth daily.    [provider]  XARELTO 20 MG TABS tablet TAKE 1 TABLET BY MOUTH DAILY WITH SUPPER 09/26/17   Allred, Jeneen Rinks, MD    Inpatient Medications: Scheduled Meds:  Continuous Infusions:  PRN Meds:   Allergies:    Allergies  Allergen Reactions  . Contrast Media [Iodinated Diagnostic Agents] Rash    a diffuse macular rash after CTA chest  Pt was premedicated with 125mg  IV Solumedrol, 50mg  IV Benadryl 1 hr prior to CTexam, and tolerated procedure without any difficulties. 11/28/11 Also same pre med protocol observed on 02/22/15 and pt tolerated the procedure well    Social History:   Social History   Socioeconomic History  . Marital status: Divorced    Spouse name: Not on file  . Number of children: Not on file  . Years of education: Not on file  . Highest education level:  Not on file  Occupational History  . Not on file  Social Needs  . Financial resource strain: Not on file  . Food insecurity:    Worry: Not on file    Inability: Not on file  . Transportation needs:    Medical: Not on file    Non-medical: Not on file  Tobacco Use  . Smoking status: Former Smoker    Packs/day: 1.00    Years: 20.00    Pack years: 20.00    Types: Cigarettes    Last attempt to quit: 07/10/2014    Years since quitting: 3.3  . Smokeless tobacco: Never Used  Substance and Sexual Activity  . Alcohol use: No    Alcohol/week: 0.0 standard drinks  . Drug use: No  . Sexual activity: Never  Lifestyle  . Physical activity:    Days per week: Not on file    Minutes per session: Not on  file  . Stress: Not on file  Relationships  . Social connections:    Talks on phone: Not on file    Gets together: Not on file    Attends religious service: Not on file    Active member of club or organization: Not on file    Attends meetings of clubs or organizations: Not on file    Relationship status: Not on file  . Intimate partner violence:    Fear of current or ex partner: Not on file    Emotionally abused: Not on file    Physically abused: Not on file    Forced sexual activity: Not on file  Other Topics Concern  . Not on file  Social History Narrative  . Not on file    Family History:    Family History  Problem Relation Age of Onset  . Hypertension Mother   . Hypertension Father   . Aneurysm Father   . Alcohol abuse Father   . Cancer Maternal Grandmother        brain  . Diabetes Maternal Grandfather        type 2  . Parkinsonism Maternal Grandfather   . Cancer Paternal Grandmother        breast  . Diabetes Paternal Grandmother   . Alcohol abuse Paternal Grandfather      ROS:  Please see the history of present illness.   All other ROS reviewed and negative.     Physical Exam/Data:   Vitals:   11/13/17 1942 11/13/17 2100 11/13/17 2111 11/13/17 2116  BP:  119/74 126/67 131/62 134/78  Pulse:  70 76 86  Resp: (!) 24 17 20  (!) 23  Temp: 98.5 F (36.9 C)     TempSrc: Oral     SpO2: 93% 95% 95% 96%  Weight:      Height:        Intake/Output Summary (Last 24 hours) at 11/14/2017 0520 Last data filed at 11/13/2017 2100 Gross per 24 hour  Intake 980.52 ml  Output -  Net 980.52 ml   Filed Weights   11/13/17 1111 11/13/17 1820  Weight: (!) 143.8 kg (!) 148.4 kg   Body mass index is 45.63 kg/m.  General:  Well nourished, well developed, in no acute distress HEENT: normal Lymph: no adenopathy Neck: no JVD Endocrine:  No thryomegaly Vascular: No carotid bruits; FA pulses 2+ bilaterally without bruits  Cardiac:  normal S1, S2; irregular rhythm; no murmur Lungs:  clear to auscultation bilaterally, no wheezing, rhonchi or rales  Abd: soft, nontender, no hepatomegaly  Ext: no edema Musculoskeletal:  No deformities, BUE and BLE strength normal and equal Skin: warm and dry, lesion on the posterior aspect of the left lower leg. Neuro:  CNs 2-12 intact, no focal abnormalities noted Psych:  Normal affect   EKG:  The EKG was personally reviewed and demonstrates: Atrial fibrillation Telemetry:  Telemetry was personally reviewed and demonstrates: Atrial fibrillation  Relevant CV Studies: Echocardiogram 03/31/2016: LV ejection fraction 60 to 65%  Laboratory Data:  Chemistry Recent Labs  Lab 11/13/17 1220  NA 138  K 4.7  CL 101  CO2 28  GLUCOSE 119*  BUN 18  CREATININE 1.17  CALCIUM 9.0  GFRNONAA >60  GFRAA >60  ANIONGAP 9    Recent Labs  Lab 11/13/17 1220  PROT 7.4  ALBUMIN 3.7  AST 18  ALT 20  ALKPHOS 71  BILITOT 0.5   Hematology Recent Labs  Lab 11/13/17 1220  WBC 9.1  RBC 5.33  HGB 16.0  HCT 49.9  MCV 93.6  MCH 30.0  MCHC 32.1  RDW 14.3  PLT 289   Cardiac Enzymes Recent Labs  Lab 11/13/17 1220  TROPONINI <0.03   No results for input(s): TROPIPOC in the last 168 hours.  BNP Recent Labs  Lab  11/13/17 1220  BNP 39.6    DDimer No results for input(s): DDIMER in the last 168 hours.  Radiology/Studies:  Dg Chest 2 View  Result Date: 11/13/2017 CLINICAL DATA:  52 year old male with dizziness, shortness of breath and syncope for the past 3 days EXAM: CHEST - 2 VIEW COMPARISON:  Prior chest x-ray 12/08/2015 FINDINGS: Stable cardiac and mediastinal contours. Vascular congestion without overt pulmonary edema. Stable bibasilar atelectasis versus scarring. No pneumothorax, pleural effusion or focal airspace consolidation. Bronchitic changes remain chronic. No acute osseous abnormality. IMPRESSION: No active cardiopulmonary disease. Electronically Signed   By: Jacqulynn Cadet M.D.   On: 11/13/2017 12:47    Assessment and Plan:   Principal Problem:   Vertigo Active Problems:   Chronic atrial fibrillation   Acute lower UTI   Tick bite of calf, right, sequela  Mr. Mathes symptoms seem most consistent with vertigo.  After our discussion it is evident that he has not experienced syncope, or significant presyncope.  He is experiencing dizziness feeling like the room is spinning around him and has positional symptoms when moving his head.  This seems most consistent with vertigo.  We should monitor his heart rhythm overnight.  If there are no significant pauses or rhythm disturbances, cardiology will sign off in the morning.  There is no clear indication to repeat an echocardiogram at this time.  He appears euvolemic, no significant changes to his medication therapy are required today.   For questions or updates, please contact [place] Please consult [email or web address] for contact info under   Signed, Elouise Munroe, MD

## 2017-11-28 DIAGNOSIS — Z23 Encounter for immunization: Secondary | ICD-10-CM | POA: Diagnosis not present

## 2017-12-04 ENCOUNTER — Ambulatory Visit: Payer: BLUE CROSS/BLUE SHIELD | Admitting: Internal Medicine

## 2017-12-04 ENCOUNTER — Encounter: Payer: Self-pay | Admitting: Internal Medicine

## 2017-12-04 VITALS — BP 108/66 | HR 74 | Ht 70.0 in | Wt 330.8 lb

## 2017-12-04 DIAGNOSIS — I10 Essential (primary) hypertension: Secondary | ICD-10-CM | POA: Diagnosis not present

## 2017-12-04 DIAGNOSIS — I4819 Other persistent atrial fibrillation: Secondary | ICD-10-CM

## 2017-12-04 DIAGNOSIS — I428 Other cardiomyopathies: Secondary | ICD-10-CM | POA: Diagnosis not present

## 2017-12-04 NOTE — Progress Notes (Signed)
PCP: System, Pcp Not In   Primary EP: Dr Rayann Heman  William Fischer is a 52 y.o. male who presents today for routine electrophysiology followup.  Since last being seen in our clinic, the patient reports doing reasonably well.  He presented to [facility] 11/13/17 with symptoms of dizziness.  He was evaluated. His symptoms were felt to be due to vertigo with room spinning. He did not have echo or event monitor performed.  Today, he denies symptoms of palpitations, chest pain, shortness of breath,  lower extremity edema, presyncope, or syncope.  The patient is otherwise without complaint today.   Past Medical History:  Diagnosis Date  . Allergy to IVP dye   . Atrial fibrillation ([place])    admx 11/13 with acute sCHF in setting of RVR  => a. failed DCCV x 2; b. Pradaxa started;  c. failed sotalol  . Chicken pox as a child  . Chronic anticoagulation    Pradaxa  . Chronic systolic heart failure ([place])    a. echo 11/13: Ef 40-45%, diff HK, mod MR, mod LAE, mild RVE, mod RAE, small effusion;   b. TEE 11/13:  EF 35-40%, no LAA clot; Echo 2/14 shows normal EF  . Depression with anxiety 06/29/2011  . Hypertension   . 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 Migraine 06/29/2011  . Obesity 06/29/2011  . Snoring    patient needs sleep study - has declined   Past Surgical History:  Procedure Laterality Date  . CARDIOVERSION  11/30/2011   Procedure: CARDIOVERSION;  Surgeon: Thayer Headings, MD;  Location: [place];  Service: Cardiovascular;  Laterality: N/A;  . CARDIOVERSION  12/03/2011   Procedure: CARDIOVERSION;  Surgeon: Jolaine Artist, MD;  Location: [place];  Service: Cardiovascular;  Laterality: N/A;  . COLECTOMY  12/07/2015   partial  . COLON SURGERY    . COLOSTOMY REVERSAL  12/07/2015  . COLOSTOMY REVERSAL N/A 12/07/2015   Procedure: OPEN REVERSAL OF COLOSTOMY WITH PARTIAL SIGMOID COLECTOMY;  Surgeon: Leighton Ruff, MD;  Location: [place];  Service: General;  Laterality: N/A;  . HERNIA REPAIR     . INCISIONAL HERNIA REPAIR  12/07/2015  . INCISIONAL HERNIA REPAIR N/A 12/07/2015   Procedure: INCISIONAL HERNIA REPAIR;  Surgeon: Leighton Ruff, MD;  Location: [place];  Service: General;  Laterality: N/A;  . IR GENERIC HISTORICAL  08/11/2015   IR RADIOLOGIST EVAL & MGMT 08/11/2015 Markus Daft, MD [facility]  . IR GENERIC HISTORICAL  11/18/2015   IR CATHETER TUBE CHANGE 11/18/2015 Corrie Mckusick, DO [facility]  . IR GENERIC HISTORICAL  10/15/2015   IR RADIOLOGIST EVAL & MGMT 10/15/2015 Jacqulynn Cadet, MD [facility]  . IR GENERIC HISTORICAL  01/27/2016   IR RADIOLOGIST EVAL & MGMT 01/27/2016 Ascencion Dike, PA-C [facility]  . IR GENERIC HISTORICAL  01/28/2016   IR CATHETER TUBE CHANGE 01/28/2016 Markus Daft, MD [facility]  . IR GENERIC HISTORICAL  02/17/2016   IR RADIOLOGIST EVAL & MGMT 02/17/2016 Ascencion Dike, PA-C [facility]  . LAPAROTOMY N/A 01/17/2015   Procedure: EXPLORATORY LAPAROTOMY WITH LEFT COLECTOMY AND COLOSTOMY;  Surgeon: Rolm Bookbinder, MD;  Location: [place];  Service: General;  Laterality: N/A;  . [place]  11/2015  . TEE WITHOUT CARDIOVERSION  11/30/2011   Procedure: TRANSESOPHAGEAL ECHOCARDIOGRAM (TEE);  Surgeon: Thayer Headings, MD;  Location: [place];  Service: Cardiovascular;  Laterality: N/A;  . TONSILLECTOMY      ROS- all systems  are reviewed and negatives except as per HPI above  Current Outpatient Medications  Medication Sig Dispense Refill  . acetaminophen (TYLENOL) 500 MG tablet Take 1,000 mg by mouth every 6 (six) hours as needed (pain).    [name] digoxin (LANOXIN) 0.125 MG tablet TAKE 1 TABLET (0.125 MG TOTAL) BY MOUTH DAILY. 30 tablet 8  . diltiazem (CARDIZEM CD) 180 MG 24 hr capsule TAKE ONE CAPSULE BY MOUTH TWICE A DAY 60 capsule 9  . furosemide (LASIX) 40 MG tablet TAKE 1 TABLET (40 MG TOTAL) BY MOUTH DAILY AS NEEDED FOR EDEMA. 30 tablet 11  . lisinopril (PRINIVIL,ZESTRIL) 5 MG tablet TAKE 1  TABLET (5 MG TOTAL) BY MOUTH DAILY. 30 tablet 9  . metoprolol tartrate (LOPRESSOR) 100 MG tablet Take 1 tablet (100 mg total) by mouth 2 (two) times daily. 60 tablet 5  . Multiple Vitamin (MULTIVITAMIN WITH MINERALS) TABS tablet Take 1 tablet by mouth daily.    Alveda Reasons 20 MG TABS tablet TAKE 1 TABLET BY MOUTH DAILY WITH SUPPER 30 tablet 2   No current facility-administered medications for this visit.     Physical Exam: Vitals:   12/04/17 1518  BP: 108/66  Pulse: 74  SpO2: 97%  Weight: (!) 330 lb 12.8 oz (150 kg)  Height: 5\' 10"  (1.778 m)    GEN- The patient is well appearing, alert and oriented x 3 today.   Head- normocephalic, atraumatic Eyes-  Sclera clear, conjunctiva pink Ears- hearing intact Oropharynx- clear Lungs- Clear to ausculation bilaterally, normal work of breathing Heart- irregular rate and rhythm, no murmurs, rubs or gallops, PMI not laterally displaced GI- soft, NT, ND, + BS Extremities- no clubbing, cyanosis, or edema  Wt Readings from Last 3 Encounters:  12/04/17 (!) 330 lb 12.8 oz (150 kg)  11/13/17 (!) 327 lb 2.6 oz (148.4 kg)  06/22/17 (!) 312 lb (141.5 kg)    EKG tracing ordered today is personally reviewed and shows afib  Assessment and Plan:  1. Dizziness I agree with Dr Margaretann Loveless that vertigo is the likely cause Improved No further workup at this time  2. Permanent afib Rate controlled No changes today Continue xarelto life long given prior mesenteric infarction  3. Nonischemic CM EF previously recovered Repeat echo at this time Weight is up, though does not appear volume overloaded  4. HTN Stable No change required today  5. Overweight Body mass index is 47.46 kg/m. Lifestyle modification is encouraged  Follow-up with EP PA every 6 months I will see when needed  Thompson Grayer MD, [facility] 12/04/2017 3:37 PM

## 2017-12-04 NOTE — Patient Instructions (Addendum)
Medication Instructions:  Your physician recommends that you continue on your current medications as directed. Please refer to the Current Medication list given to you today.  Labwork: None ordered.  Testing/Procedures: Your physician has requested that you have an echocardiogram. Echocardiography is a painless test that uses sound waves to create images of your heart. It provides your doctor with information about the size and shape of your heart and how well your heart's chambers and valves are working. This procedure takes approximately one hour. There are no restrictions for this procedure.  Please schedule for ECHO  Follow-Up: Your physician wants you to follow-up in: 6 months with Tommye Standard, PA.   You will receive a reminder letter in the mail two months in advance. If you don't receive a letter, please call our office to schedule the follow-up appointment.   Any Other Special Instructions Will Be Listed Below (If Applicable).  If you need a refill on your cardiac medications before your next appointment, please call your pharmacy.

## 2017-12-08 ENCOUNTER — Other Ambulatory Visit (HOSPITAL_COMMUNITY): Payer: BLUE CROSS/BLUE SHIELD

## 2017-12-09 IMAGING — RF DG SINUS / FISTULA TRACT / ABSCESSOGRAM
8 series · 8 of 8 positions shown · non-contrast
Comparison: none

INDICATION: 49-year-old male with a history of bowel perforation.

[Series 1: run · 1 of 1 slices shown (1 of 8)]
[im 1/1]
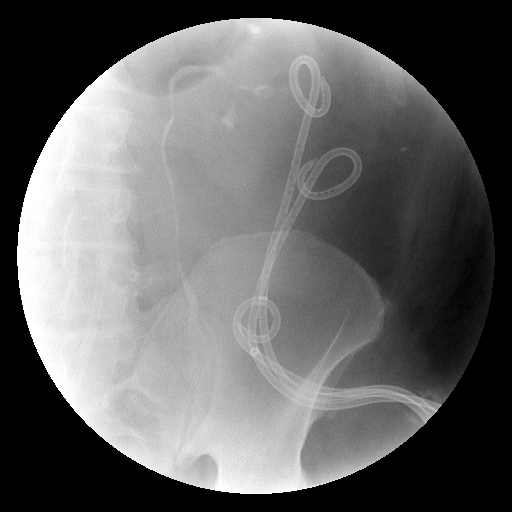

[Series 2: run · 1 of 1 slices shown (2 of 8)]
[im 1/1]
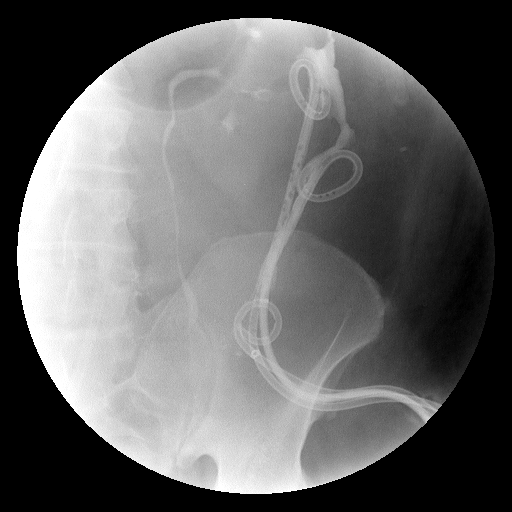

[Series 3: run · 1 of 1 slices shown (3 of 8)]
[im 1/1]
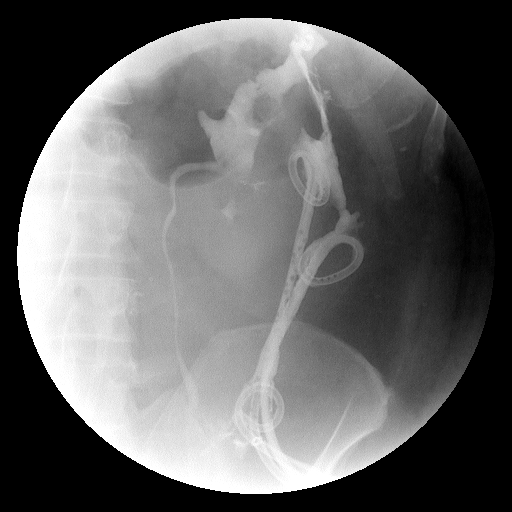

[Series 4: run · 1 of 1 slices shown (4 of 8)]
[im 1/1]
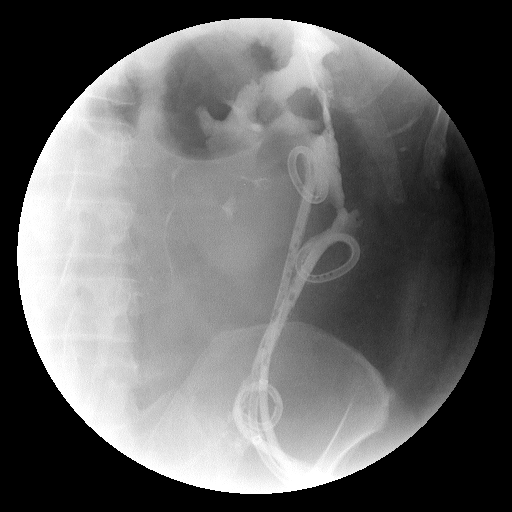

[Series 5: run · 1 of 1 slices shown (5 of 8)]
[im 1/1]
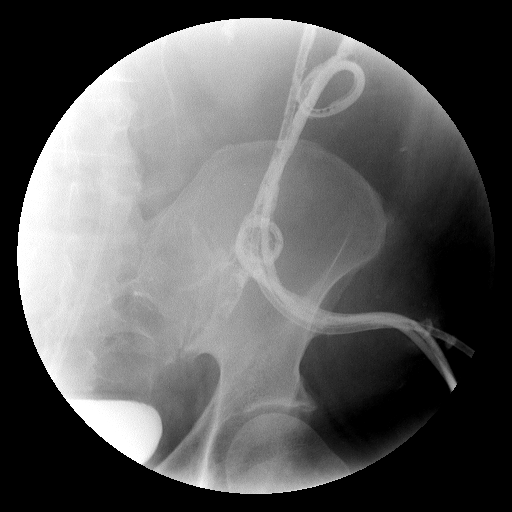

[Series 6: run · 1 of 1 slices shown (6 of 8)]
[im 1/1]
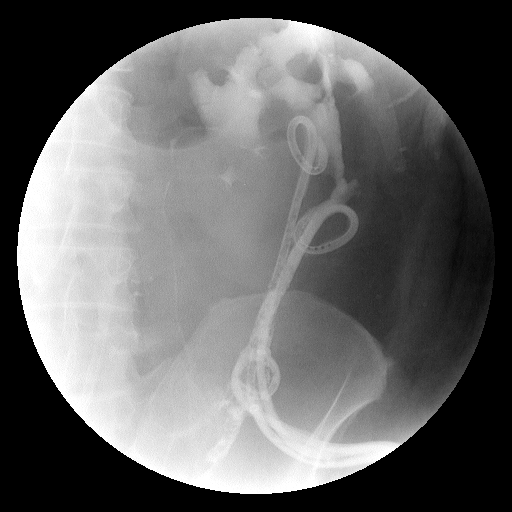

[Series 7: run · 1 of 1 slices shown (7 of 8)]
[im 1/1]
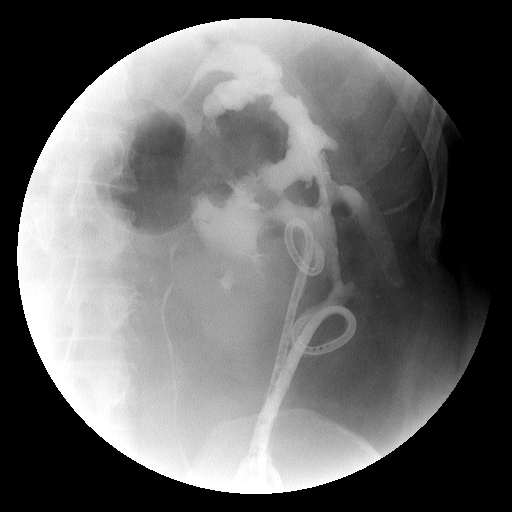

[Series 8: run · 1 of 1 slices shown (8 of 8)]
[im 1/1]
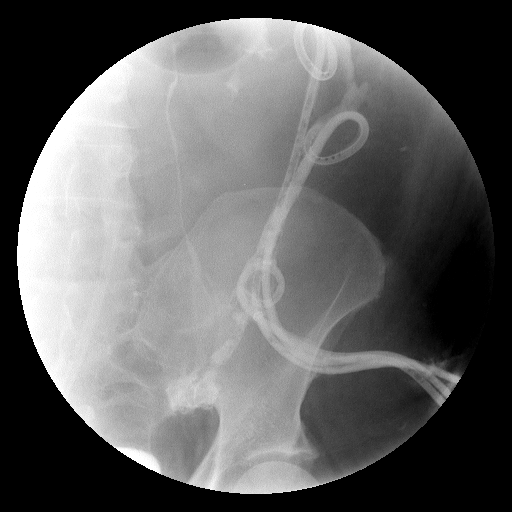

[8 of 8 positions shown; findings below may reference images not displayed]

He has had 3 left-sided percutaneous drainage catheters placed.

Prior CT scan suggested possibility of a fistula to the colon.

He presents today for evaluation and drain injection.

He tells me that only 1 drain has significant output, greater than
150 cc per day. The other 2 drains are negligible. He continues to
flush all 3 drains 3 times a day.

EXAM:
ABSCESS INJECTION

MEDICATIONS:
None

ANESTHESIA/SEDATION:
None

COMPLICATIONS:
None

PROCEDURE:
Informed written consent was obtained from the patient after a
thorough discussion of the procedural risks, benefits and
alternatives. All questions were addressed. Maximal Barrier
Technique was utilized. A timeout was performed prior to the
initiation of the procedure.

Each of the 3 left abdomen drains were addressed sequentially.

Initial scout image demonstrates all 3 catheters in the left
abdomen. Retained contrast present within the left renal collecting
system, status post contrast-enhanced CT.

Initial injection of the middle catheter demonstrates no significant
fluid adjacent to the catheter. There is small amount of contrast
tracking superiorly, partially filling space in the left
subdiaphragmatic region. No connection to small bowel or colon
visualized. Catheter was flushed and attached to gravity drainage.

The next drain that was injected was the lowest drain in the left
pelvis. No significant fluid adjacent to the catheter, although the
fluid tract inferiorly towards the pelvis, potentially in
communication with the fluid along the left psoas. Catheter was
flushed and attached to gravity drainage.

The final drained which was injected was the superior drain, which
appeared also to have a communication with the subdiaphragmatic
fluid collection. No connection a small bowel or colon identified.
Catheter was flushed in attached to gravity drainage.
IMPRESSION: Status post drain check and injection of 3 separate left-sided
percutaneous pigtail catheter drains, with no fistulous connection
identified.

The middle drain is the drain with the most output, greater than 150
cc per day per the patient. Injection of this drain appears to
communicate with the subdiaphragmatic fluid collection.

The superior drain also appears to communicate with the
subdiaphragmatic fluid collection.

The inferior drain of the pelvis may potentially communicate with
the fluid overlying the psoas muscle.

## 2017-12-20 ENCOUNTER — Other Ambulatory Visit: Payer: Self-pay | Admitting: Internal Medicine

## 2017-12-24 ENCOUNTER — Other Ambulatory Visit (HOSPITAL_COMMUNITY): Payer: Self-pay | Admitting: Internal Medicine

## 2017-12-25 ENCOUNTER — Ambulatory Visit (HOSPITAL_COMMUNITY): Payer: BLUE CROSS/BLUE SHIELD | Attending: Cardiology

## 2017-12-25 ENCOUNTER — Other Ambulatory Visit: Payer: Self-pay

## 2017-12-25 DIAGNOSIS — I428 Other cardiomyopathies: Secondary | ICD-10-CM | POA: Diagnosis not present

## 2018-01-29 ENCOUNTER — Other Ambulatory Visit: Payer: Self-pay

## 2018-01-29 DIAGNOSIS — I482 Chronic atrial fibrillation, unspecified: Secondary | ICD-10-CM

## 2018-01-29 MED ORDER — DILTIAZEM HCL ER COATED BEADS 180 MG PO CP24: 180.0000 mg | ORAL_CAPSULE | Freq: Two times a day (BID) | ORAL | 3 refills | Status: DC

## 2018-01-29 MED ORDER — RIVAROXABAN 20 MG PO TABS: ORAL_TABLET | ORAL | 3 refills | Status: DC

## 2018-01-29 NOTE — Telephone Encounter (Signed)
Xarelto 20mg  refill request received; pt is 53yr old male, wt-150kg, Crea-1.17 on 11/13/2017, last seen by Dr. Rayann Heman on 12/04/2017, CrCl-160.31ml/min; will send in refill to requested pharmacy.

## 2018-04-30 ENCOUNTER — Telehealth: Payer: Self-pay

## 2018-04-30 NOTE — Telephone Encounter (Signed)
Left message regarding appt on 05/04/18.

## 2018-05-04 ENCOUNTER — Telehealth: Payer: BLUE CROSS/BLUE SHIELD | Admitting: Internal Medicine

## 2018-05-21 ENCOUNTER — Telehealth: Payer: Self-pay | Admitting: Internal Medicine

## 2018-05-21 ENCOUNTER — Telehealth: Payer: Self-pay

## 2018-05-21 NOTE — Telephone Encounter (Signed)
I recieved the following message from covermymeds: William Fischer Key: [id number] Need help? Call us at [phone]  Outcome  Cancelledtoday  No PPA required...JKitchen  I called [facility] and was advised that Xarelto will cost the pt over $1000 and that the pt may have a deductible.  I called the pt and offered him a Xarelto $10 co-pay card. He is picking up today in the downstairs lobby of the [facility] office [address] location.  He is advised to call me back if he has any problems with the card.

## 2018-05-21 NOTE — Telephone Encounter (Signed)
  Pt c/o medication issue:  1. Name of Medication: rivaroxaban (XARELTO) 20 MG TABS tablet  2. How are you currently taking this medication (dosage and times per day)? As directed  3. Are you having a reaction (difficulty breathing--STAT)?  No  4. What is your medication issue? Patient went to pick up his script and his insurance denied paying for this and he cannot afford it. He needs to know what to do from here. He is completely out of medication.

## 2018-05-21 NOTE — Telephone Encounter (Signed)
Pt has been on medication (xarelto) for several years.  He states the "promo code is over" on this medication per his pharmacy and he states he can not pay the requested $1600 for three months of the medication. Pts insurance is still BCBS and has not changed.  Pt is very concerned because he can not afford this medication and states the last time he couldn't afford the medication, he ended up in the ICU.   Will route to [place] for advisement and Myrtie Hawk for [employer]. Assured pt we would make sure he has access to the medication he needs.

## 2018-05-21 NOTE — Telephone Encounter (Signed)
I have done a Xarelto PA through covermymeds. Key: [id number]

## 2018-05-21 NOTE — Telephone Encounter (Signed)
**Note De-Identified Tiahna Cure Obfuscation** I have done a Xarelto PA through covermymeds. Key: [id number]

## 2018-05-22 NOTE — Telephone Encounter (Signed)
See other phone note from 05/21/2018 for details.

## 2018-08-22 MED FILL — XARELTO 20 MG TABLET: 20 | 30 days supply | Qty: 30 | Fill #0

## 2018-09-13 ENCOUNTER — Ambulatory Visit: Payer: BLUE CROSS/BLUE SHIELD | Admitting: Internal Medicine

## 2018-09-18 MED FILL — XARELTO 20 MG TABLET: 20 | 30 days supply | Qty: 30 | Fill #1

## 2018-10-06 ENCOUNTER — Other Ambulatory Visit: Payer: Self-pay | Admitting: Physician Assistant

## 2018-10-06 DIAGNOSIS — I482 Chronic atrial fibrillation, unspecified: Secondary | ICD-10-CM

## 2018-10-09 ENCOUNTER — Telehealth: Payer: Self-pay

## 2018-10-09 NOTE — Telephone Encounter (Signed)
**Note De-Identified William Fischer Obfuscation** The pt left his completed [name] Pt Asst Application for Xarelto at the office. We have completed the provider part of the application, Dr Radford Pax (DOD) has signed it and we have faxed all to [employer] Pt Asst program.

## 2018-10-18 ENCOUNTER — Telehealth (HOSPITAL_COMMUNITY): Payer: Self-pay | Admitting: *Deleted

## 2018-10-18 MED FILL — XARELTO 20 MG TABLET: 20 | 30 days supply | Qty: 30 | Fill #2

## 2018-10-18 NOTE — Telephone Encounter (Signed)
Pt calling stating he will be out of xarelto in 1 week - he has applied to patient assistance for xarelto but has not heard back. He will either need samples or if [facility] will fill at low cost? Please call pt at [phone] to let him know. He cannot afford the cost out of pocket for cash pay at [facility].

## 2018-10-18 NOTE — Telephone Encounter (Addendum)
**Note De-Identified Kc Summerson Obfuscation** The pt is advised to contact [name] and [employer] Pt Asst program to check the staus of his application and I did provide him with their phone number.  He is aware to call me back if there is anything needed from [place] concerning his application and/or if he is going to run out of Xarelto before this is handled.  He verbalized understanding and thanked me for calling him back.

## 2018-10-31 ENCOUNTER — Other Ambulatory Visit: Payer: Self-pay | Admitting: Internal Medicine

## 2018-10-31 NOTE — Telephone Encounter (Signed)
Prescription refill request for Xarelto received.   Last office visit: Allred (12/05/2018) Weight: 150 kg Age: 54 yo Scr:1.17 (11/13/2017)  CrCl: 123ml/min    Prescription refill sent.

## 2019-01-31 ENCOUNTER — Other Ambulatory Visit: Payer: Self-pay | Admitting: Internal Medicine

## 2019-01-31 DIAGNOSIS — I482 Chronic atrial fibrillation, unspecified: Secondary | ICD-10-CM

## 2019-02-14 ENCOUNTER — Ambulatory Visit: Payer: Self-pay | Admitting: Internal Medicine

## 2019-04-24 ENCOUNTER — Other Ambulatory Visit: Payer: Self-pay | Admitting: Internal Medicine

## 2019-04-24 DIAGNOSIS — I482 Chronic atrial fibrillation, unspecified: Secondary | ICD-10-CM

## 2020-05-25 DIAGNOSIS — G4733 Obstructive sleep apnea (adult) (pediatric): Secondary | ICD-10-CM | POA: Insufficient documentation

## 2021-09-01 ENCOUNTER — Emergency Department (HOSPITAL_COMMUNITY): Payer: Self-pay

## 2021-09-01 ENCOUNTER — Inpatient Hospital Stay (HOSPITAL_COMMUNITY)
Admission: EM | Admit: 2021-09-01 | Discharge: 2021-09-02 | DRG: 947 | Discharge status: Against Medical Advice | Payer: Self-pay | Attending: Internal Medicine | Admitting: Internal Medicine

## 2021-09-01 ENCOUNTER — Other Ambulatory Visit: Payer: Self-pay

## 2021-09-01 DIAGNOSIS — Z7901 Long term (current) use of anticoagulants: Secondary | ICD-10-CM

## 2021-09-01 DIAGNOSIS — R079 Chest pain, unspecified: Secondary | ICD-10-CM

## 2021-09-01 DIAGNOSIS — Z20822 Contact with and (suspected) exposure to covid-19: Secondary | ICD-10-CM | POA: Diagnosis present

## 2021-09-01 DIAGNOSIS — Z811 Family history of alcohol abuse and dependence: Secondary | ICD-10-CM

## 2021-09-01 DIAGNOSIS — I4819 Other persistent atrial fibrillation: Secondary | ICD-10-CM | POA: Diagnosis present

## 2021-09-01 DIAGNOSIS — I4892 Unspecified atrial flutter: Secondary | ICD-10-CM | POA: Diagnosis present

## 2021-09-01 DIAGNOSIS — Z91041 Radiographic dye allergy status: Secondary | ICD-10-CM

## 2021-09-01 DIAGNOSIS — Z803 Family history of malignant neoplasm of breast: Secondary | ICD-10-CM

## 2021-09-01 DIAGNOSIS — I5043 Acute on chronic combined systolic (congestive) and diastolic (congestive) heart failure: Secondary | ICD-10-CM | POA: Diagnosis present

## 2021-09-01 DIAGNOSIS — Z8249 Family history of ischemic heart disease and other diseases of the circulatory system: Secondary | ICD-10-CM

## 2021-09-01 DIAGNOSIS — I110 Hypertensive heart disease with heart failure: Secondary | ICD-10-CM | POA: Diagnosis present

## 2021-09-01 DIAGNOSIS — Z87891 Personal history of nicotine dependence: Secondary | ICD-10-CM

## 2021-09-01 DIAGNOSIS — Z820 Family history of epilepsy and other diseases of the nervous system: Secondary | ICD-10-CM

## 2021-09-01 DIAGNOSIS — I2489 Other forms of acute ischemic heart disease: Secondary | ICD-10-CM | POA: Insufficient documentation

## 2021-09-01 DIAGNOSIS — I248 Other forms of acute ischemic heart disease: Secondary | ICD-10-CM | POA: Diagnosis present

## 2021-09-01 DIAGNOSIS — Z79899 Other long term (current) drug therapy: Secondary | ICD-10-CM

## 2021-09-01 DIAGNOSIS — Z6841 Body Mass Index (BMI) 40.0 and over, adult: Secondary | ICD-10-CM

## 2021-09-01 DIAGNOSIS — Z597 Insufficient social insurance and welfare support: Secondary | ICD-10-CM

## 2021-09-01 DIAGNOSIS — I4891 Unspecified atrial fibrillation: Secondary | ICD-10-CM | POA: Diagnosis present

## 2021-09-01 DIAGNOSIS — Z5329 Procedure and treatment not carried out because of patient's decision for other reasons: Secondary | ICD-10-CM | POA: Diagnosis present

## 2021-09-01 DIAGNOSIS — Z808 Family history of malignant neoplasm of other organs or systems: Secondary | ICD-10-CM

## 2021-09-01 DIAGNOSIS — G4733 Obstructive sleep apnea (adult) (pediatric): Secondary | ICD-10-CM | POA: Diagnosis present

## 2021-09-01 DIAGNOSIS — R778 Other specified abnormalities of plasma proteins: Secondary | ICD-10-CM

## 2021-09-01 DIAGNOSIS — Z833 Family history of diabetes mellitus: Secondary | ICD-10-CM

## 2021-09-01 DIAGNOSIS — R5381 Other malaise: Principal | ICD-10-CM | POA: Diagnosis present

## 2021-09-01 DIAGNOSIS — Z91141 Patient's other noncompliance with medication regimen due to financial hardship: Secondary | ICD-10-CM

## 2021-09-01 DIAGNOSIS — Z885 Allergy status to narcotic agent status: Secondary | ICD-10-CM

## 2021-09-01 DIAGNOSIS — R739 Hyperglycemia, unspecified: Secondary | ICD-10-CM | POA: Diagnosis present

## 2021-09-01 LAB — HEPATIC FUNCTION PANEL
ALT: 21 U/L (ref 0–44)
AST: 22 U/L (ref 15–41)
Albumin: 3.7 g/dL (ref 3.5–5.0)
Alkaline Phosphatase: 52 U/L (ref 38–126)
Bilirubin, Direct: 0.2 mg/dL (ref 0.0–0.2)
Indirect Bilirubin: 1 mg/dL — ABNORMAL HIGH (ref 0.3–0.9)
Total Bilirubin: 1.2 mg/dL (ref 0.3–1.2)
Total Protein: 7.4 g/dL (ref 6.5–8.1)

## 2021-09-01 LAB — TROPONIN I (HIGH SENSITIVITY)
Troponin I (High Sensitivity): 20 ng/L — ABNORMAL HIGH (ref <18)
Troponin I (High Sensitivity): 19 ng/L — ABNORMAL HIGH (ref <18)

## 2021-09-01 LAB — BASIC METABOLIC PANEL
Anion gap: 9 (ref 5–15)
BUN: 18 mg/dL (ref 6–20)
CO2: 27 mmol/L (ref 22–32)
Calcium: 9.2 mg/dL (ref 8.9–10.3)
Chloride: 101 mmol/L (ref 98–111)
Creatinine, Ser: 1.09 mg/dL (ref 0.61–1.24)
GFR, Estimated: >60 mL/min (ref >60)
Glucose, Bld: 184 mg/dL — ABNORMAL HIGH (ref 70–99)
Potassium: 4.6 mmol/L (ref 3.5–5.1)
Sodium: 137 mmol/L (ref 135–145)

## 2021-09-01 LAB — CBG MONITORING, ED: Glucose-Capillary: 179 mg/dL — ABNORMAL HIGH (ref 70–99)

## 2021-09-01 LAB — CBC
HCT: 52.4 % — ABNORMAL HIGH (ref 39.0–52.0)
Hemoglobin: 17.4 g/dL — ABNORMAL HIGH (ref 13.0–17.0)
MCH: 31.2 pg (ref 26.0–34.0)
MCHC: 33.2 g/dL (ref 30.0–36.0)
MCV: 93.9 fL (ref 80.0–100.0)
Platelets: 248 10*3/uL (ref 150–400)
RBC: 5.58 MIL/uL (ref 4.22–5.81)
RDW: 14.5 % (ref 11.5–15.5)
WBC: 9.6 10*3/uL (ref 4.0–10.5)
nRBC: 0 % (ref 0.0–0.2)

## 2021-09-01 LAB — BRAIN NATRIURETIC PEPTIDE: B Natriuretic Peptide: 107.6 pg/mL — ABNORMAL HIGH (ref 0.0–100.0)

## 2021-09-01 LAB — MAGNESIUM: Magnesium: 2.1 mg/dL (ref 1.7–2.4)

## 2021-09-01 LAB — DIGOXIN LEVEL: Digoxin Level: <0.2 ng/mL — ABNORMAL LOW (ref 0.8–2.0)

## 2021-09-01 LAB — SARS CORONAVIRUS 2 BY RT PCR: SARS Coronavirus 2 by RT PCR: NEGATIVE

## 2021-09-01 LAB — HEMOGLOBIN A1C
Hgb A1c MFr Bld: 7.2 % — ABNORMAL HIGH (ref 4.8–5.6)
Mean Plasma Glucose: 159.94 mg/dL

## 2021-09-01 MED ORDER — MORPHINE SULFATE (PF) 4 MG/ML IV SOLN
4.0000 mg | Freq: Once | INTRAVENOUS | Status: AC
  Administered 2021-09-01: 4 mg via INTRAVENOUS
  Filled 2021-09-01: qty 1

## 2021-09-01 MED ORDER — METOPROLOL TARTRATE 50 MG PO TABS
50.0000 mg | ORAL_TABLET | Freq: Two times a day (BID) | ORAL | Status: DC
  Administered 2021-09-01 – 2021-09-02 (×2): 50 mg via ORAL
  Filled 2021-09-01 (×2): qty 2
  Filled 2021-09-01: qty 1

## 2021-09-01 MED ORDER — METOPROLOL TARTRATE 25 MG PO TABS: 50.0000 mg | ORAL_TABLET | Freq: Two times a day (BID) | ORAL | Status: DC

## 2021-09-01 MED ORDER — DILTIAZEM HCL-DEXTROSE 125-5 MG/125ML-% IV SOLN (PREMIX)
5.0000 mg/h | INTRAVENOUS | Status: DC
  Administered 2021-09-01: 5 mg/h via INTRAVENOUS
  Filled 2021-09-01: qty 125

## 2021-09-01 MED ORDER — ACETAMINOPHEN 325 MG PO TABS
650.0000 mg | ORAL_TABLET | ORAL | Status: DC | PRN
  Administered 2021-09-02: 650 mg via ORAL
  Filled 2021-09-01: qty 2

## 2021-09-01 MED ORDER — FUROSEMIDE 10 MG/ML IJ SOLN
40.0000 mg | Freq: Once | INTRAMUSCULAR | Status: AC
  Administered 2021-09-01: 40 mg via INTRAVENOUS
  Filled 2021-09-01: qty 4

## 2021-09-01 MED ORDER — ONDANSETRON HCL 4 MG/2ML IJ SOLN: 4.0000 mg | Freq: Four times a day (QID) | INTRAMUSCULAR | Status: DC | PRN

## 2021-09-01 MED ORDER — RIVAROXABAN 20 MG PO TABS
20.0000 mg | ORAL_TABLET | Freq: Every day | ORAL | Status: DC
  Administered 2021-09-01 – 2021-09-02 (×2): 20 mg via ORAL
  Filled 2021-09-01: qty 1
  Filled 2021-09-01: qty 2

## 2021-09-01 MED ORDER — DILTIAZEM HCL-DEXTROSE 125-5 MG/125ML-% IV SOLN (PREMIX): 0.0000 mg/h | INTRAVENOUS | Status: DC

## 2021-09-01 MED ORDER — FUROSEMIDE 10 MG/ML IJ SOLN
40.0000 mg | Freq: Two times a day (BID) | INTRAMUSCULAR | Status: DC
Start: 2021-09-01 — End: 2021-09-03
  Administered 2021-09-01 – 2021-09-02 (×3): 40 mg via INTRAVENOUS
  Filled 2021-09-01 (×3): qty 4

## 2021-09-01 MED ORDER — SACUBITRIL-VALSARTAN 24-26 MG PO TABS: 1.0000 | ORAL_TABLET | Freq: Two times a day (BID) | ORAL | Status: DC

## 2021-09-01 MED ORDER — ASPIRIN 81 MG PO CHEW
324.0000 mg | CHEWABLE_TABLET | Freq: Once | ORAL | Status: AC
Start: 2021-09-01 — End: 2021-09-01
  Filled 2021-09-01: qty 4

## 2021-09-01 MED ORDER — METOPROLOL TARTRATE 25 MG PO TABS: 100.0000 mg | ORAL_TABLET | Freq: Two times a day (BID) | ORAL | Status: DC

## 2021-09-01 MED ORDER — DILTIAZEM LOAD VIA INFUSION
15.0000 mg | Freq: Once | INTRAVENOUS | Status: AC
  Administered 2021-09-01: 15 mg via INTRAVENOUS
  Filled 2021-09-01: qty 15

## 2021-09-01 NOTE — ED Provider Notes (Signed)
[place] EMERGENCY DEPARTMENT Provider Note   CSN: [id number] Arrival date & time: 09/01/21  1233     History  Chief Complaint  Patient presents with   Shortness of Breath   Chest Pain    William Fischer is a 56 y.o. male.   Shortness of Breath Associated symptoms: chest pain   Chest Pain Associated symptoms: shortness of breath      Patient has a history of atrial fibrillation, chronic systolic heart failure, hypertension, mesenteric embolus who presents to the ED with complaints of shortness of breath and chest pain.  Patient has a history of atrial fibrillation that is longstanding.  Patient has remained in A-fib despite prior ablation procedures and cardioversion.  Patient noted that over the last few days he has been having increasing shortness of breath.  He denies any leg swelling.  Today was significantly worse and he became diaphoretic.   He was also placed on a nonrebreather.  He has been having pressure type pain in his left chest for the last 5 days.  Patient was given aspirin and sublingual nitroglycerin on route.  Patient denies any fevers.  He has been taking his medications which does include Xarelto. Home Medications Prior to Admission medications   Medication Sig Start Date End Date Taking? Authorizing Provider  acetaminophen (TYLENOL) 500 MG tablet Take 1,000 mg by mouth every 6 (six) hours as needed (pain).    [provider]  digoxin (LANOXIN) 0.125 MG tablet TAKE 1 TABLET (0.125 MG TOTAL) BY MOUTH DAILY. 01/04/17   Richardson Dopp T, PA-C  diltiazem (CARDIZEM CD) 180 MG 24 hr capsule TAKE 1 CAPSULE BY MOUTH 2 TIMES DAILY. PLEASE KEEP UPCOMING APPT IN JANUARY WITH DR. ALLRED BEFORE ANYMORE REFILLS. 04/24/19   Allred, Jeneen Rinks, MD  furosemide (LASIX) 40 MG tablet TAKE 1 TABLET (40 MG TOTAL) BY MOUTH DAILY AS NEEDED FOR EDEMA. 07/04/16   Allred, Jeneen Rinks, MD  lisinopril (ZESTRIL) 5 MG tablet TAKE 1 TABLET (5 MG TOTAL) BY MOUTH DAILY. 10/08/18    Richardson Dopp T, PA-C  metoprolol tartrate (LOPRESSOR) 100 MG tablet Take 1 tablet (100 mg total) by mouth 2 (two) times daily. Please keep upcoming appt in January with Dr. Rayann Heman before anymore refills. Thank you 01/31/19   Thompson Grayer, MD  Multiple Vitamin (MULTIVITAMIN WITH MINERALS) TABS tablet Take 1 tablet by mouth daily.    [provider]  XARELTO 20 MG TABS tablet TAKE 1 TABLET BY MOUTH DAILY WITH SUPPER 10/31/18   Allred, Jeneen Rinks, MD      Allergies    Contrast media [iodinated contrast media] and Tramadol    Review of Systems   Review of Systems  Respiratory:  Positive for shortness of breath.   Cardiovascular:  Positive for chest pain.    Physical Exam Updated Vital Signs BP (!) 139/100   Pulse (!) 125   Temp 98.2 F (36.8 C) (Oral)   Resp (!) 27   Ht 1.778 m ('5\' 10"'$ )   Wt (!) 150.6 kg   SpO2 96%   BMI 47.64 kg/m  Physical Exam Vitals and nursing note reviewed.  Constitutional:      Appearance: He is well-developed. He is not diaphoretic.  HENT:     Head: Normocephalic and atraumatic.     Right Ear: External ear normal.     Left Ear: External ear normal.  Eyes:     General: No scleral icterus.       Right eye: No discharge.  Left eye: No discharge.     Conjunctiva/sclera: Conjunctivae normal.  Neck:     Trachea: No tracheal deviation.  Cardiovascular:     Rate and Rhythm: Tachycardia present. Rhythm irregular.  Pulmonary:     Effort: Pulmonary effort is normal. No tachypnea or respiratory distress.     Breath sounds: Normal breath sounds. No stridor. No wheezing or rales.  Abdominal:     General: Bowel sounds are normal. There is no distension.     Palpations: Abdomen is soft.     Tenderness: There is no abdominal tenderness. There is no guarding or rebound.  Musculoskeletal:        General: No tenderness or deformity.     Cervical back: Neck supple.     Right lower leg: No edema.     Left lower leg: No edema.  Skin:    General: Skin  is warm and dry.     Findings: No rash.  Neurological:     General: No focal deficit present.     Mental Status: He is alert.     Cranial Nerves: No cranial nerve deficit (no facial droop, extraocular movements intact, no slurred speech).     Sensory: No sensory deficit.     Motor: No abnormal muscle tone or seizure activity.     Coordination: Coordination normal.  Psychiatric:        Mood and Affect: Mood normal.     ED Results / Procedures / Treatments   Labs (all labs ordered are listed, but only abnormal results are displayed) Labs Reviewed  BASIC METABOLIC PANEL - Abnormal; Notable for the following components:      Result Value   Glucose, Bld 184 (*)    All other components within normal limits  CBC - Abnormal; Notable for the following components:   Hemoglobin 17.4 (*)    HCT 52.4 (*)    All other components within normal limits  BRAIN NATRIURETIC PEPTIDE - Abnormal; Notable for the following components:   B Natriuretic Peptide 107.6 (*)    All other components within normal limits  CBG MONITORING, ED - Abnormal; Notable for the following components:   Glucose-Capillary 179 (*)    All other components within normal limits  TROPONIN I (HIGH SENSITIVITY) - Abnormal; Notable for the following components:   Troponin I (High Sensitivity) 20 (*)    All other components within normal limits  SARS CORONAVIRUS 2 BY RT PCR  MAGNESIUM  DIGOXIN LEVEL  TROPONIN I (HIGH SENSITIVITY)    EKG EKG Interpretation  Date/Time:  Wednesday September 01 2021 12:35:06 EDT Ventricular Rate:  132 PR Interval:    QRS Duration: 76 QT Interval:  335 QTC Calculation: 493 R Axis:   -6 Text Interpretation: Atrial flutter Anterior infarct, old Since last tracing rate faster Confirmed by Dorie Rank [phone]) on 09/01/2021 12:47:41 PM  Radiology DG Chest Portable 1 View  Result Date: 09/01/2021 CLINICAL DATA:  Shortness of breath and left chest pain EXAM: PORTABLE CHEST 1 VIEW COMPARISON:   November 05, 2017, January 28, 2015 FINDINGS: Unchanged cardiomegaly.  Unchanged mediastinal contours. Bilateral reticular pulmonary opacities superimposed on a background of chronic basilar scarring/fibrotic changes. Probable small left-greater-than-right pleural effusions. No large pneumothorax. No acute osseous abnormality. The visualized upper abdomen is unremarkable. IMPRESSION: 1. Mild pulmonary edema superimposed on a background of chronic basilar scarring/fibrotic changes. 2. Unchanged cardiomegaly. Electronically Signed   By: Beryle Flock M.D.   On: 09/01/2021 13:05    Procedures .Critical Care  Performed by: Dorie Rank, MD Authorized by: Dorie Rank, MD   Critical care provider statement:    Critical care time (minutes):  30   Critical care was time spent personally by me on the following activities:  Development of treatment plan with patient or surrogate, discussions with consultants, evaluation of patient's response to treatment, examination of patient, ordering and review of laboratory studies, ordering and review of radiographic studies, ordering and performing treatments and interventions, pulse oximetry, re-evaluation of patient's condition and review of old charts     Medications Ordered in ED Medications  diltiazem (CARDIZEM) 1 mg/mL load via infusion 15 mg (15 mg Intravenous Bolus from Bag 09/01/21 1316)    And  diltiazem (CARDIZEM) 125 mg in dextrose 5% 125 mL (1 mg/mL) infusion (5 mg/hr Intravenous New Bag/Given 09/01/21 1316)  furosemide (LASIX) injection 40 mg (has no administration in time range)  aspirin chewable tablet 324 mg (324 mg Oral Given by EMS 09/01/21 1317)  morphine (PF) 4 MG/ML injection 4 mg (4 mg Intravenous Given 09/01/21 1325)    ED Course/ Medical Decision Making/ A&P Clinical Course as of 09/01/21 1446  Wed Sep 01, 2021  1322 Patient having persistent chest pain.  Tachycardia has decreased.  Will give dose of morphine for pain [JK]  1414 Chest  x-ray suggest mild edema [JK]  1414 CBC(!) Normal with the exception of elevated hemoglobin [JK]  0272 Basic metabolic panel(!) Hyperglycemia noted [JK]  1414 Troponin I (High Sensitivity)(!) Initial troponin elevated at 20 [JK]  1444 Case discussed with Wannetta Sender, cardiology regarding admission [JK]    Clinical Course User Index [JK] Dorie Rank, MD                           Medical Decision Making Problems Addressed: Atrial flutter with rapid ventricular response [facility]): acute illness or injury that poses a threat to life or bodily functions Chest pain, unspecified type: acute illness or injury that poses a threat to life or bodily functions Elevated troponin: acute illness or injury that poses a threat to life or bodily functions  Amount and/or Complexity of Data Reviewed Labs: ordered. Decision-making details documented in ED Course. Radiology: ordered and independent interpretation performed. ECG/medicine tests: ordered.  Risk OTC drugs. Prescription drug management. Drug therapy requiring intensive monitoring for toxicity. Decision regarding hospitalization.   Patient present presented with chest pain and shortness of breath.  Patient has known history of CHF and persistent atrial fibrillation.  Patient noted to be tachycardic in the 140s.  Patient started on a Cardizem drip with improvement of his heart rate.  Chest pain however persisted.  Patient was given a dose of morphine with some improvement in his pain.  Concerned about the possibility of acute coronary syndrome.  Patient does have elevated troponin and risk factors.  Will continue to monitor.  He is on anticoagulation  X-ray also suggests a component of CHF likely related to his rapid A-fib causing his shortness of breath.  Will order dose of Lasix.  With his chest pain elevated troponin I will consult with cardiology for admission and further treatment        Final Clinical Impression(s) / ED Diagnoses Final  diagnoses:  Atrial flutter with rapid ventricular response ([place])  Chest pain, unspecified type  Elevated troponin    Rx / DC Orders ED Discharge Orders     None         Dorie Rank, MD 09/01/21 1446

## 2021-09-01 NOTE — Assessment & Plan Note (Addendum)
Most recent echocardiogram from [facility] on 12/31/2020 showed EF 45% Believed to be tachycardia induced, though never had ischemic eval. Patient's regimen previously included metoprolol 100 mg daily, spironolactone 25 mg daily, torsemide 10 mg daily, entresto 24-26 mg BID; however, patient reports that he has not taken any medications for the past 2 months as reports he lost his insurance. 1. CHF pathway 2. Tele monitor 3. Strict intake and output 4. Daily BMP 5. As per cards consult 1. 2d echo 2. Metoprolol '50mg'$  PO BID 3. Lasix '40mg'$  IV BID 6. Tele monitor

## 2021-09-01 NOTE — H&P (Signed)
History and Physical    Patient: William Fischer PNT:[id number] DOB: 18-Aug-1965 DOA: 09/01/2021 DOS: the patient was seen and examined on 09/01/2021 PCP: Pcp, No  Patient coming from: Home  Chief Complaint:  Chief Complaint  Patient presents with   Shortness of Breath   Chest Pain   HPI: William Fischer is a 56 y.o. male with medical history significant of atrial fibrillation, HTN, mesenteric embolus, OSA, chronic systolic heart failure.  Pt with h/o HFrEF first in 2013 in setting of A.Fib RVR.  EF 40-45% at that time, diffuse hypokinesis.  Failed cardioversion x2, failed sotalol.  Started on rate controlling meds.  F/u echo in 2014 showed improvement in EF to 50-55%.  Futher improvement in 2019 to 55-60%.  Mod dilated atria bilaterally.  Switched to [employer] cards after that: ablation in 03/2019, loaded on amiodarone.  In sinus for 4-5 months.  CHF exacerbation later in 09/2019 -> back in to A.Fib.  EF 45-50% -> cardioverted again -> back into A.Fib by Nov.  Most recent echocardiogram was completed on 12/31/2020 and showed LVEF 45-50%, normal RV function. He was last seen by [facility] cardiology on 12/31/2020- at that time, patient was started on entresto, spironolactone, torsemide,. Note that they suspected that his reduced EF was caused by atrial fibrillation, but he had never had any form of ischemic evaluation.  Pt off meds for past 2 months due to his insurance lapsing.  Today pt presents to ED with SOB, progressive for past few weeks. Positive orthopnea, PND, SOB now to point where SOB with speaking.  Also this week onset of L sided CP, sharp and stabbing.  Occurs randomly at rest or with exertion.  No ankle edema.  Review of Systems: As mentioned in the history of present illness. All other systems reviewed and are negative. Past Medical History:  Diagnosis Date   Allergy to IVP dye    Atrial fibrillation ([place])    admx 11/13 with acute sCHF in setting of RVR  => a. failed DCCV x 2; b.  Pradaxa started;  c. failed sotalol   Chicken pox as a child   Chronic anticoagulation    Pradaxa   Chronic systolic heart failure ([place])    a. echo 11/13: Ef 40-45%, diff HK, mod MR, mod LAE, mild RVE, mod RAE, small effusion;   b. TEE 11/13:  EF 35-40%, no LAA clot; Echo 2/14 shows normal EF   Depression with anxiety 06/29/2011   Hypertension    Mesenteric embolus ([place]) 12/2014   Migraine 06/29/2011   Obesity 06/29/2011   Snoring    patient needs sleep study - has declined   Past Surgical History:  Procedure Laterality Date   CARDIOVERSION  11/30/2011   Procedure: CARDIOVERSION;  Surgeon: Thayer Headings, MD;  Location: [facility] ENDOSCOPY;  Service: Cardiovascular;  Laterality: N/A;   CARDIOVERSION  12/03/2011   Procedure: CARDIOVERSION;  Surgeon: Jolaine Artist, MD;  Location: [facility] OR;  Service: Cardiovascular;  Laterality: N/A;   COLECTOMY  12/07/2015   partial   COLON SURGERY     COLOSTOMY REVERSAL  12/07/2015   COLOSTOMY REVERSAL N/A 12/07/2015   Procedure: OPEN REVERSAL OF COLOSTOMY WITH PARTIAL SIGMOID COLECTOMY;  Surgeon: Leighton Ruff, MD;  Location: [place] OR;  Service: General;  Laterality: N/A;   [place]  12/07/2015   INCISIONAL HERNIA REPAIR N/A 12/07/2015   Procedure: [place];  Surgeon: Leighton Ruff, MD;  Location: [place];  Service:  General;  Laterality: N/A;   IR GENERIC HISTORICAL  08/11/2015   IR RADIOLOGIST EVAL & MGMT 08/11/2015 Markus Daft, MD [facility] INTERV RAD   IR GENERIC HISTORICAL  11/18/2015   IR CATHETER TUBE CHANGE 11/18/2015 Corrie Mckusick, DO [facility]   IR GENERIC HISTORICAL  10/15/2015   IR RADIOLOGIST EVAL & MGMT 10/15/2015 Jacqulynn Cadet, MD [facility] INTERV RAD   IR GENERIC HISTORICAL  01/27/2016   IR RADIOLOGIST EVAL & MGMT 01/27/2016 Ascencion Dike, PA-C [facility] INTERV RAD   IR GENERIC HISTORICAL  01/28/2016   IR CATHETER TUBE CHANGE 01/28/2016 Markus Daft, MD [facility]   IR GENERIC HISTORICAL  02/17/2016   IR  RADIOLOGIST EVAL & MGMT 02/17/2016 Ascencion Dike, PA-C [facility] INTERV RAD   LAPAROTOMY N/A 01/17/2015   Procedure: EXPLORATORY LAPAROTOMY WITH LEFT COLECTOMY AND COLOSTOMY;  Surgeon: Rolm Bookbinder, MD;  Location: [place];  Service: General;  Laterality: N/A;   [place]  11/2015   TEE WITHOUT CARDIOVERSION  11/30/2011   Procedure: TRANSESOPHAGEAL ECHOCARDIOGRAM (TEE);  Surgeon: Thayer Headings, MD;  Location: [place];  Service: Cardiovascular;  Laterality: N/A;   TONSILLECTOMY     Social History:  reports that he quit smoking about 7 years ago. His smoking use included cigarettes. He has a 20.00 pack-year smoking history. He has never used smokeless tobacco. He reports that he does not drink alcohol and does not use drugs.  Allergies  Allergen Reactions   Contrast Media [Iodinated Contrast Media] Rash    a diffuse macular rash after CTA chest  Pt was premedicated with '125mg'$  IV Solumedrol, '50mg'$  IV Benadryl 1 hr prior to CTexam, and tolerated procedure without any difficulties. 11/28/11 Also same pre med protocol observed on 02/22/15 and pt tolerated the procedure well   Tramadol     Family History  Problem Relation Age of Onset   Hypertension Mother    Hypertension Father    Aneurysm Father    Alcohol abuse Father    Cancer Maternal Grandmother        brain   Diabetes Maternal Grandfather        type 2   Parkinsonism Maternal Grandfather    Cancer Paternal Grandmother        breast   Diabetes Paternal Grandmother    Alcohol abuse Paternal Grandfather     Prior to Admission medications   Medication Sig Start Date End Date Taking? Authorizing Provider  acetaminophen (TYLENOL) 500 MG tablet Take 1,000 mg by mouth every 6 (six) hours as needed (pain).   Yes [provider]  digoxin (LANOXIN) 0.125 MG tablet TAKE 1 TABLET (0.125 MG TOTAL) BY MOUTH DAILY. 01/04/17  Yes Weaver, Scott T, PA-C  diltiazem (CARDIZEM CD) 180 MG 24 hr  capsule TAKE 1 CAPSULE BY MOUTH 2 TIMES DAILY. PLEASE KEEP UPCOMING APPT IN JANUARY WITH DR. ALLRED BEFORE ANYMORE REFILLS. 04/24/19  Yes Allred, Jeneen Rinks, MD  furosemide (LASIX) 40 MG tablet TAKE 1 TABLET (40 MG TOTAL) BY MOUTH DAILY AS NEEDED FOR EDEMA. 07/04/16  Yes Allred, Jeneen Rinks, MD  lisinopril (ZESTRIL) 5 MG tablet TAKE 1 TABLET (5 MG TOTAL) BY MOUTH DAILY. 10/08/18  Yes Weaver, Scott T, PA-C  metoprolol tartrate (LOPRESSOR) 100 MG tablet Take 1 tablet (100 mg total) by mouth 2 (two) times daily. Please keep upcoming appt in January with Dr. Rayann Heman before anymore refills. Thank you 01/31/19  Yes Allred, Jeneen Rinks, MD  XARELTO 20 MG TABS tablet TAKE 1 TABLET BY MOUTH DAILY WITH SUPPER  10/31/18  Yes Thompson Grayer, MD    Physical Exam: Vitals:   09/01/21 1830 09/01/21 1845 09/01/21 1900 09/01/21 1915  BP: 115/73 118/87 111/72 102/68  Pulse: 83 87 (!) 104 93  Resp: (!) 24 (!) 24 (!) 24 (!) 21  Temp:      TempSrc:      SpO2: 92% 91% 94% 92%  Weight:      Height:       Constitutional: NAD, calm, comfortable Eyes: PERRL, lids and conjunctivae normal ENMT: Mucous membranes are moist. Posterior pharynx clear of any exudate or lesions.Normal dentition.  Neck: normal, supple, no masses, no thyromegaly Respiratory: clear to auscultation bilaterally, no wheezing, no crackles. Normal respiratory effort. No accessory muscle use.  Cardiovascular: IRR, IRR, no murmurs / rubs / gallops. No extremity edema. 2+ pedal pulses. No carotid bruits.  Abdomen: no tenderness, no masses palpated. No hepatosplenomegaly. Bowel sounds positive.  Musculoskeletal: no clubbing / cyanosis. No joint deformity upper and lower extremities. Good ROM, no contractures. Normal muscle tone.  Skin: no rashes, lesions, ulcers. No induration Neurologic: CN 2-12 grossly intact. Sensation intact, DTR normal. Strength 5/5 in all 4.  Psychiatric: Normal judgment and insight. Alert and oriented x 3. Normal mood.   Data Reviewed:    Trops 20  and 19  BNP 107.6  BMP: BGL 184, otherwise unremarkable.  CBC: HGB 17.4, otherwise unremarkable  CXR = mild pulm edema superimposed on background of fibrosis / scaring, also cardiomegaly that is chronic  EKG: A.Fib RVR, rate 132  Assessment and Plan: * Atrial fibrillation with rapid ventricular response (HCC) Failed rhythm control multiple times in past (see HPI). Afib pathway Rate control: Metoprolol PO '15mg'$  cardizem bolus given IV Rate controlled currently in ED without needing to start cardizem gtt Will leave cardizem gtt on his orders list in case this is needed later Restart Xarelto SW consult for affordability  Acute on chronic combined systolic (congestive) and diastolic (congestive) heart failure ([place]) Most recent echocardiogram from [facility] on 12/31/2020 showed EF 45% Believed to be tachycardia induced, though never had ischemic eval. Patient's regimen previously included metoprolol 100 mg daily, spironolactone 25 mg daily, torsemide 10 mg daily, entresto 24-26 mg BID; however, patient reports that he has not taken any medications for the past 2 months as reports he lost his insurance. CHF pathway Tele monitor Strict intake and output Daily BMP As per cards consult 2d echo Metoprolol '50mg'$  PO BID Lasix '40mg'$  IV BID Tele monitor  Debility Family discussing putting him in ALF. Getting SW consult. ? Any chance he qualifies for social security disability? Not only for ALF purposes but maybe for medicare purposes?  Morbid obesity (HCC) Check A1C. If new onset DM: would love to get him on Mounjaro or Ozempic, but with no insurance, cost is going to be prohibitive.      Advance Care Planning:   Code Status: Full Code   Consults: Cards consult note in chart  Family Communication: No family in room  Severity of Illness: The appropriate patient status for this patient is OBSERVATION. Observation status is judged to be reasonable and necessary in order to provide the  required intensity of service to ensure the patient's safety. The patient's presenting symptoms, physical exam findings, and initial radiographic and laboratory data in the context of their medical condition is felt to place them at decreased risk for further clinical deterioration. Furthermore, it is anticipated that the patient will be medically stable for discharge from the hospital within 2  midnights of admission.   Author: Etta Quill., DO 09/01/2021 8:16 PM  For on call review [email or web address].

## 2021-09-01 NOTE — ED Triage Notes (Signed)
Pt BIB EMS for shob and CP. Pt complains of increasing SHOB today and per family he was diaphoretic. Pt complains of chest pain x5 days. Pt has a history of blood clots, A-Fib, COPD, and heart failure. Pt was given 324 of ASA and 1 sublingual nitro en route.   EMS Vitals A-fib 100-140 94% on NRB

## 2021-09-01 NOTE — ED Notes (Signed)
Notified dr Alcario Drought of pt current vitals...controlled rate of afib in the 80's. Will hold off on restarting cardizem gtt at this time.

## 2021-09-01 NOTE — Assessment & Plan Note (Signed)
Family discussing putting him in ALF. Getting SW consult. ? Any chance he qualifies for social security disability? Not only for ALF purposes but maybe for medicare purposes?

## 2021-09-01 NOTE — Consult Note (Addendum)
Cardiology Consult Note:   Patient ID: CHAZE HRUSKA MRN: [id number]; DOB: 1965-09-04   Admission date: 09/01/2021  PCP:  Pcp, No   [facility] Providers Cardiologist:  Thompson Grayer, MD    Chief Complaint:  Chest pain, atrial fibrillation   Patient Profile:   William Fischer is a 56 y.o. male with a history of atrial fibrillation, HTN, mesenteric embolus, OSA, chronic systolic heart failure who is being seen 09/01/2021 for the evaluation of atrial fibrillation at the request of Dr. Tomi Bamberger   History of Present Illness:   William Fischer is a 56 year old male with above medical history who is followed by Dr. Rayann Heman (last seen in 2019).  Per char review, patient was admitted to the hospital in 11/2011 with acute systolic heart failure in the setting of afib with RVR. Echocardiogram showed EF 40-45%, diffuse hypokinesis. Failed cardioversion x2. Failed sotalol. Started on rate controlling medications.  Follow up echocardiogram on 02/27/2012 showed EF had improved to 50-55%. A later echocardiogram on 12/25/2017 showed EF 55-60%, no regional wall motion abnormalities, moderately dilated LA, moderately dilated RA.   Patient left our practice and was later followed by [facility] cardiology. He underwent an atrial fibrillation ablation on 04/03/2019. He was loaded on amiodarone and was successfully cardioverted 4 weeks later. He sustained sinus rhythm for 4-5 months, then had a heart failure exacerbation in 09/2019 and went into atrial fibrillation again. Echocardiogram from 10/10/2019 showed EF 45-50%. He was diuresed and then cardioverted to sinus rhythm. Was back in atrial fibrillation by 11/18/2019.   Most recent echocardiogram was completed on 12/31/2020 and showed LVEF 45-50%, normal RV function. He was last seen by cardiology on 12/31/2020- at that time, patient was started on entresto, spironolactone, torsemide,. Note that they suspected that his reduced EF was caused by atrial fibrillation,  but he had never had any form of ischemic evaluation.   On interview, patient reports that he has been having progressive SOB for the past few weeks. Reports orthopnea, PND. SOB has progressed to the point where he has SOB while speaking. This week he started to have left sided chest pain. Pain is sharp, "feels like someone is stabbing him." Pain is not associated with position, occurs randomly on exertion or while at rest. Denies ankle edema.   Past Medical History:  Diagnosis Date   Allergy to IVP dye    Atrial fibrillation ([place])    admx 11/13 with acute sCHF in setting of RVR  => a. failed DCCV x 2; b. Pradaxa started;  c. failed sotalol   Chicken pox as a child   Chronic anticoagulation    Pradaxa   Chronic systolic heart failure ([place])    a. echo 11/13: Ef 40-45%, diff HK, mod MR, mod LAE, mild RVE, mod RAE, small effusion;   b. TEE 11/13:  EF 35-40%, no LAA clot; Echo 2/14 shows normal EF   Depression with anxiety 06/29/2011   Hypertension    Mesenteric embolus ([place]) 12/2014   Migraine 06/29/2011   Obesity 06/29/2011   Snoring    patient needs sleep study - has declined    Past Surgical History:  Procedure Laterality Date   CARDIOVERSION  11/30/2011   Procedure: CARDIOVERSION;  Surgeon: Thayer Headings, MD;  Location: [place];  Service: Cardiovascular;  Laterality: N/A;   CARDIOVERSION  12/03/2011   Procedure: CARDIOVERSION;  Surgeon: Jolaine Artist, MD;  Location: [facility] OR;  Service: Cardiovascular;  Laterality: N/A;   COLECTOMY  12/07/2015  partial   COLON SURGERY     COLOSTOMY REVERSAL  12/07/2015   COLOSTOMY REVERSAL N/A 12/07/2015   Procedure: OPEN REVERSAL OF COLOSTOMY WITH PARTIAL SIGMOID COLECTOMY;  Surgeon: Leighton Ruff, MD;  Location: [place];  Service: General;  Laterality: N/A;   [place]  12/07/2015   INCISIONAL HERNIA REPAIR N/A 12/07/2015   Procedure: INCISIONAL HERNIA REPAIR;  Surgeon: Leighton Ruff, MD;  Location: [place];   Service: General;  Laterality: N/A;   IR GENERIC HISTORICAL  08/11/2015   IR RADIOLOGIST EVAL & MGMT 08/11/2015 Markus Daft, MD [facility]   IR GENERIC HISTORICAL  11/18/2015   IR CATHETER TUBE CHANGE 11/18/2015 Corrie Mckusick, DO [facility]   IR GENERIC HISTORICAL  10/15/2015   IR RADIOLOGIST EVAL & MGMT 10/15/2015 Jacqulynn Cadet, MD [facility]   IR GENERIC HISTORICAL  01/27/2016   IR RADIOLOGIST EVAL & MGMT 01/27/2016 Ascencion Dike, PA-C [facility]   IR GENERIC HISTORICAL  01/28/2016   IR CATHETER TUBE CHANGE 01/28/2016 Markus Daft, MD [facility]   IR GENERIC HISTORICAL  02/17/2016   IR RADIOLOGIST EVAL & MGMT 02/17/2016 Ascencion Dike, PA-C [facility]   LAPAROTOMY N/A 01/17/2015   Procedure: EXPLORATORY LAPAROTOMY WITH LEFT COLECTOMY AND COLOSTOMY;  Surgeon: Rolm Bookbinder, MD;  Location: [place];  Service: General;  Laterality: N/A;   [place]  11/2015   TEE WITHOUT CARDIOVERSION  11/30/2011   Procedure: TRANSESOPHAGEAL ECHOCARDIOGRAM (TEE);  Surgeon: Thayer Headings, MD;  Location: [place];  Service: Cardiovascular;  Laterality: N/A;   TONSILLECTOMY       Medications Prior to Admission: Prior to Admission medications   Medication Sig Start Date End Date Taking? Authorizing Provider  acetaminophen (TYLENOL) 500 MG tablet Take 1,000 mg by mouth every 6 (six) hours as needed (pain).    [provider]  digoxin (LANOXIN) 0.125 MG tablet TAKE 1 TABLET (0.125 MG TOTAL) BY MOUTH DAILY. 01/04/17   Richardson Dopp T, PA-C  diltiazem (CARDIZEM CD) 180 MG 24 hr capsule TAKE 1 CAPSULE BY MOUTH 2 TIMES DAILY. PLEASE KEEP UPCOMING APPT IN JANUARY WITH DR. ALLRED BEFORE ANYMORE REFILLS. 04/24/19   Allred, Jeneen Rinks, MD  furosemide (LASIX) 40 MG tablet TAKE 1 TABLET (40 MG TOTAL) BY MOUTH DAILY AS NEEDED FOR EDEMA. 07/04/16   Allred, Jeneen Rinks, MD  lisinopril (ZESTRIL) 5 MG tablet TAKE 1 TABLET (5 MG TOTAL) BY MOUTH DAILY. 10/08/18   Richardson Dopp T, PA-C  metoprolol tartrate (LOPRESSOR) 100 MG tablet Take 1 tablet (100 mg total) by mouth 2 (two) times daily. Please keep upcoming appt in January with Dr. Rayann Heman before anymore refills. Thank you 01/31/19   Thompson Grayer, MD  Multiple Vitamin (MULTIVITAMIN WITH MINERALS) TABS tablet Take 1 tablet by mouth daily.    [provider]  XARELTO 20 MG TABS tablet TAKE 1 TABLET BY MOUTH DAILY WITH SUPPER 10/31/18   Thompson Grayer, MD     Allergies:    Allergies  Allergen Reactions   Contrast Media [Iodinated Contrast Media] Rash    a diffuse macular rash after CTA chest  Pt was premedicated with '125mg'$  IV Solumedrol, '50mg'$  IV Benadryl 1 hr prior to CTexam, and tolerated procedure without any difficulties. 11/28/11 Also same pre med protocol observed on 02/22/15 and pt tolerated the procedure well   Tramadol     Social History:   Social History   Socioeconomic History   Marital  status: Divorced    Spouse name: Not on file   Number of children: Not on file   Years of education: Not on file   Highest education level: Not on file  Occupational History   Not on file  Tobacco Use   Smoking status: Former    Packs/day: 1.00    Years: 20.00    Total pack years: 20.00    Types: Cigarettes    Quit date: 07/10/2014    Years since quitting: 7.1   Smokeless tobacco: Never  Substance and Sexual Activity   Alcohol use: No    Alcohol/week: 0.0 standard drinks of alcohol   Drug use: No   Sexual activity: Never  Other Topics Concern   Not on file  Social History Narrative   Not on file   Social Determinants of Health   Financial Resource Strain: Not on file  Food Insecurity: Not on file  Transportation Needs: Not on file  Physical Activity: Not on file  Stress: Not on file  Social Connections: Not on file  Intimate Partner Violence: Not on file    Family History:   The patient's family history includes Alcohol abuse in his father and paternal grandfather; Aneurysm in his  father; Cancer in his maternal grandmother and paternal grandmother; Diabetes in his maternal grandfather and paternal grandmother; Hypertension in his father and mother; Parkinsonism in his maternal grandfather.    ROS:  Please see the history of present illness.  All other ROS reviewed and negative.     Physical Exam/Data:   Vitals:   09/01/21 1400 09/01/21 1415 09/01/21 1455 09/01/21 1548  BP: (!) 125/109 (!) 139/100 128/83 (!) 150/102  Pulse: (!) 122 (!) 125 88 (!) 104  Resp: (!) 23 (!) 27 (!) 27 (!) 23  Temp:    (!) 97.5 F (36.4 C)  TempSrc:    Temporal  SpO2: 95% 96% 90% 94%  Weight:      Height:       No intake or output data in the 24 hours ending 09/01/21 1709    09/01/2021   12:41 PM 12/04/2017    3:18 PM 11/13/2017    6:20 PM  Last 3 Weights  Weight (lbs) 332 lb 330 lb 12.8 oz 327 lb 2.6 oz  Weight (kg) 150.594 kg 150.05 kg 148.4 kg     Body mass index is 47.64 kg/m.  General:  Uncomfortable appearing middle aged male in no acute distress HEENT: normal Neck: Body habitus makes JVD difficult to evaluate  Vascular: Radial pulses 2+ bilaterally   Cardiac:  Irregular rate and rhythm, tachycardic  Lungs:  diminished breath sounds in bilateral lung bases  Abd: Distended, nontender  Ext: trace edema Musculoskeletal:  No deformities, BUE and BLE strength normal and equal Skin: warm and dry  Neuro:  CNs 2-12 intact, no focal abnormalities noted Psych:  Normal affect    EKG:  The ECG that was done 8/16 was personally reviewed and demonstrates atrial fibrillation, HR 132 BPM   Relevant CV Studies:   Laboratory Data:  High Sensitivity Troponin:   Recent Labs  Lab 09/01/21 1244 09/01/21 1503  TROPONINIHS 20* 19*      Chemistry Recent Labs  Lab 09/01/21 1244  NA 137  K 4.6  CL 101  CO2 27  GLUCOSE 184*  BUN 18  CREATININE 1.09  CALCIUM 9.2  MG 2.1  GFRNONAA >60  ANIONGAP 9    No results for input(s): "PROT", "ALBUMIN", "AST", "ALT", "ALKPHOS",  "BILITOT"  in the last 168 hours. Lipids No results for input(s): "CHOL", "TRIG", "HDL", "LABVLDL", "LDLCALC", "CHOLHDL" in the last 168 hours. Hematology Recent Labs  Lab 09/01/21 1244  WBC 9.6  RBC 5.58  HGB 17.4*  HCT 52.4*  MCV 93.9  MCH 31.2  MCHC 33.2  RDW 14.5  PLT 248   Thyroid No results for input(s): "TSH", "FREET4" in the last 168 hours. BNP Recent Labs  Lab 09/01/21 1244  BNP 107.6*    DDimer No results for input(s): "DDIMER" in the last 168 hours.   Radiology/Studies:  DG Chest Portable 1 View  Result Date: 09/01/2021 CLINICAL DATA:  Shortness of breath and left chest pain EXAM: PORTABLE CHEST 1 VIEW COMPARISON:  November 05, 2017, January 28, 2015 FINDINGS: Unchanged cardiomegaly.  Unchanged mediastinal contours. Bilateral reticular pulmonary opacities superimposed on a background of chronic basilar scarring/fibrotic changes. Probable small left-greater-than-right pleural effusions. No large pneumothorax. No acute osseous abnormality. The visualized upper abdomen is unremarkable. IMPRESSION: 1. Mild pulmonary edema superimposed on a background of chronic basilar scarring/fibrotic changes. 2. Unchanged cardiomegaly. Electronically Signed   By: Beryle Flock M.D.   On: 09/01/2021 13:05     Assessment and Plan:   Atrial Fibrillation with RVR  - Patient has a longstanding history of atrial fibrillation-- had an ablation in 2021 and has had multiple cardioversions in the past. Most recently, he has been on a regiment of amiodarone 100 mg daily and metoprolol 100 mg daily. His cardiologist at [facility] noted that his atria were very enlarged on echo and was unlikely to maintain sinus rhythm - Now, patient presenting in afib with RVR-- telemetry shows HR in the 100s-130s - Started on dilt drip in the ED - Start metoprolol 50 mg BID and wean diltiazem  - Hold off on home PO amiodarone for now--this is not the best medicine for patient as he is in permanent afib, we are pursing  rate control, and he is young which increased risks of toxicity  - Continue xarelto.   Will need social work consult, he has been off all his medications for 2 months has lost his insurance and not able to afford. - Suspect HR will improve with volume status (historically, in past admissions his HR improved with diuresis)   Acute on Chronic Combined Systolic and Diastolic Heart Failure  - Most recent echocardiogram from [facility] on 12/31/2020 showed EF 45%, mild LVH (per interpretation, images not available) - Thought that reduced EF was tachycardia induced from A-fib, however patient has never had an ischemic evaluation -Patient's regimen previously included metoprolol 100 mg daily, spironolactone 25 mg daily, torsemide 10 mg daily, entresto 24-26 mg BID-- However, patient reports that he has not taken any medications for the past 2 months as reports he lost his insurance - Now, patient complains of several weeks of progressive SOB, orthopnea, PND. BNP elevated to 107.6 (may be falsely low due to obesity). CXR with mild pulmonary edema superimposed on a background of chronic basilar scarring.  Appears volume overloaded on exam - Started on IV lasix 40 mg BID - Start metoprolol 50 mg BID  - Add GDMT as tolerated.   - Strict I/Os, daily weights  - Ordered echocardiogram to assess LV function   Chest Pain  Elevated Trop  - hsTn 20>>19  - Patient complaining of sharp, left sided chest pain. Occurs randomly - Presentation not consistent with ACS. Suspect trop elevation is secondary to CHF exacerbation, rapid afib - No need for ischemic eval at  this time   Social Determinants of Health  - Patient's family has been considering placing him in an assisted living facility, patient very anxious about the transition - Patient lost his insurance recently, has not been taking any medications, will need social work consult   Risk Assessment/Risk Scores:   HEAR Score (for undifferentiated chest pain):   Pathway blocked   New York Heart Association (NYHA) Functional Class NYHA Class IV  CHA2DS2-VASc Score = 3  This indicates a 3.2% annual risk of stroke. The patient's score is based upon: CHF History: 1 HTN History: 1 Diabetes History: 1 Stroke History: 0 Vascular Disease History: 0 Age Score: 0 Gender Score: 0   For questions or updates, please contact [place] Please consult [email or web address] for contact info under     Signed, Margie Billet, PA-C  09/01/2021 5:09 PM   Patient seen and examined.  Agree with above documentation.  William Fischer is a 56 year old male with a history of likely permanent atrial fibrillation, OSA, chronic systolic heart failure, hypertension, mesenteric embolus who we are consulted by Dr. Tomi Bamberger for evaluation of atrial fibrillation.  He previously followed with Dr. Rayann Heman in EP, last seen in 2019.  Since that time he is followed at [facility] cardiology.  He underwent ablation for A-fib 03/2019.  He maintained sinus rhythm for about 4 to 5 months then went back in atrial fibrillation.  He was started on amiodarone but remained in A-fib.  Last echocardiogram 12/31/2020 showed EF 45 to 50%.  Last seen by cardiologist at [facility] on 12/31/2020.  He reports he lost his insurance 2 months ago and has been off all his medications since that time.  He was having worsening shortness of breath, prompting him to present to the ED today.  In the ED, initial vital signs notable for BP 116/96, pulse 129, respiratory rate 26, SPO2 98% on NRB.  Labs notable for creatinine 1.09, sodium 137, potassium 4.6, magnesium 2.1, BNP 108, troponin 20 > 19, WBC 9.6, hemoglobin 17.4, platelets 248.  EKG shows atrial fibrillation, rate 132, poor R wave progression, low voltage.  Telemetry shows A-fib with rates 100s to 130s.  On exam, patient is alert and oriented, irregular rhythm, tachycardic, no murmurs, bibasilar crackles, + JVD.  For his A-fib with RVR, he was started on  diltiazem drip in the ED.  Would favor switching to metoprolol given his systolic dysfunction.  Will start p.o. metoprolol and wean off diltiazem drip.  For his acute on chronic combined heart failure, likely exacerbated by medication noncompliance, as he has been off all his medications for 2 months as he lost his insurance.  Will need social work consult so he can obtain his meds on discharge.  He appears volume overloaded on exam.  Will start IV Lasix 40 mg twice daily.  Will add GDMT as tolerated.  Donato Heinz, MD

## 2021-09-01 NOTE — Assessment & Plan Note (Signed)
Failed rhythm control multiple times in past (see HPI). 1. Afib pathway 2. Rate control: 1. Metoprolol PO 2. '15mg'$  cardizem bolus given IV 3. Rate controlled currently in ED without needing to start cardizem gtt 1. Will leave cardizem gtt on his orders list in case this is needed later 3. Restart Xarelto 1. SW consult for affordability

## 2021-09-01 NOTE — ED Notes (Signed)
Admit Provider at bedside. 

## 2021-09-01 NOTE — Assessment & Plan Note (Addendum)
Check A1C. If new onset DM: would love to get him on Mounjaro or Ozempic, but with no insurance, cost is going to be prohibitive.

## 2021-09-01 NOTE — ED Notes (Signed)
Pt's daughter called, and was given an update via phone

## 2021-09-02 ENCOUNTER — Observation Stay (HOSPITAL_COMMUNITY): Payer: Self-pay

## 2021-09-02 DIAGNOSIS — I4891 Unspecified atrial fibrillation: Secondary | ICD-10-CM | POA: Diagnosis present

## 2021-09-02 DIAGNOSIS — I248 Other forms of acute ischemic heart disease: Secondary | ICD-10-CM

## 2021-09-02 DIAGNOSIS — I5043 Acute on chronic combined systolic (congestive) and diastolic (congestive) heart failure: Secondary | ICD-10-CM

## 2021-09-02 LAB — CBC
HCT: 54.1 % — ABNORMAL HIGH (ref 39.0–52.0)
Hemoglobin: 17.7 g/dL — ABNORMAL HIGH (ref 13.0–17.0)
MCH: 31.2 pg (ref 26.0–34.0)
MCHC: 32.7 g/dL (ref 30.0–36.0)
MCV: 95.2 fL (ref 80.0–100.0)
Platelets: 264 10*3/uL (ref 150–400)
RBC: 5.68 MIL/uL (ref 4.22–5.81)
RDW: 14.6 % (ref 11.5–15.5)
WBC: 11.5 10*3/uL — ABNORMAL HIGH (ref 4.0–10.5)
nRBC: 0 % (ref 0.0–0.2)

## 2021-09-02 LAB — BASIC METABOLIC PANEL
Anion gap: 14 (ref 5–15)
BUN: 17 mg/dL (ref 6–20)
CO2: 21 mmol/L — ABNORMAL LOW (ref 22–32)
Calcium: 8.9 mg/dL (ref 8.9–10.3)
Chloride: 103 mmol/L (ref 98–111)
Creatinine, Ser: 1.14 mg/dL (ref 0.61–1.24)
GFR, Estimated: >60 mL/min (ref >60)
Glucose, Bld: 183 mg/dL — ABNORMAL HIGH (ref 70–99)
Potassium: 4.6 mmol/L (ref 3.5–5.1)
Sodium: 138 mmol/L (ref 135–145)

## 2021-09-02 LAB — TROPONIN I (HIGH SENSITIVITY)
Troponin I (High Sensitivity): 18 ng/L — ABNORMAL HIGH (ref <18)
Troponin I (High Sensitivity): 19 ng/L — ABNORMAL HIGH (ref <18)
Troponin I (High Sensitivity): 16 ng/L (ref <18)

## 2021-09-02 LAB — ECHOCARDIOGRAM COMPLETE
AR max vel: 3.44 cm2
AV Area VTI: 3.31 cm2
AV Area mean vel: 3.23 cm2
AV Mean grad: 3 mmHg
AV Peak grad: 4.5 mmHg
Ao pk vel: 1.06 m/s
Area-P 1/2: 3.48 cm2
Height: 70 in
S' Lateral: 5.3 cm
Weight: 5312 oz

## 2021-09-02 LAB — HIV ANTIBODY (ROUTINE TESTING W REFLEX): HIV Screen 4th Generation wRfx: NONREACTIVE

## 2021-09-02 LAB — MAGNESIUM: Magnesium: 1.9 mg/dL (ref 1.7–2.4)

## 2021-09-02 MED ORDER — NITROGLYCERIN 0.4 MG SL SUBL
0.4000 mg | SUBLINGUAL_TABLET | SUBLINGUAL | Status: DC | PRN
  Administered 2021-09-02 (×2): 0.4 mg via SUBLINGUAL
  Filled 2021-09-02: qty 1

## 2021-09-02 MED ORDER — PERFLUTREN LIPID MICROSPHERE
1.0000 mL | INTRAVENOUS | Status: AC | PRN
  Administered 2021-09-02: 4 mL via INTRAVENOUS

## 2021-09-02 NOTE — ED Notes (Signed)
Pt transport to [place] room 24 via wheelchair by this nurse on portable tele monitoring., Report given and care endorsed.

## 2021-09-02 NOTE — ED Notes (Signed)
Called report to Abner Greenspan RN at this time.

## 2021-09-02 NOTE — ED Notes (Signed)
Paged Dr. Florencia Reasons at this time.

## 2021-09-02 NOTE — Progress Notes (Signed)
Patient complaint of CP rating it 8/10, b/p stable, nitro given, patient rate pain at 6 after one nitro, recheck b/p wnl, second nitro given, patient said the pain went away after second nitro. MD Erlinda Hong and PA [facility] made aware. Complaint of headache post nitro, tylenol given for headache.

## 2021-09-02 NOTE — Progress Notes (Signed)
PROGRESS NOTE    William Fischer  QMG:[id number] DOB: 1965/08/04 DOA: 09/01/2021 PCP: Merryl Hacker, No     Brief Narrative:   William Fischer is a 56 y.o. male with medical history significant of atrial fibrillation, HTN, mesenteric embolus, OSA, chronic systolic heart failure.    Pt off meds for past 2 months due to his insurance lapsing.    pt presents to ED with progressive SOB to point where SOB with speaking, orthopnea, PND and  L sided CP, sharp and stabbing.  Occurs randomly at rest or with exertion.  No ankle edema.  Subjective:  2 L urine output documented since admission, currently on 4 L of oxygen, O2 sat in the low 90s ,respiration rate ranged from 21-26 He is anxious, pressured speech, reports intermittent left side chest pain No edema  Assessment & Plan:  Principal Problem:   Atrial fibrillation with rapid ventricular response (HCC) Active Problems:   Acute on chronic combined systolic (congestive) and diastolic (congestive) heart failure (HCC)   Debility   Morbid obesity (HCC)    Assessment and Plan:   * Atrial fibrillation with rapid ventricular response ([place]) Failed rhythm control multiple times in past ,  Failed cardioversion x2, failed sotalol.  Run out meds for about 5monthSeen by cardiology, plan for rate control, currently on metoprolol and Cardizem drip, restart Xarelto  Acute on chronic combined systolic (congestive) and diastolic (congestive) heart failure (HCC) LVEF 40-45% in 2013, ef improved  to 55-60% in 2019, LVEF reduced to 45-50% in 2022,  Patient's regimen previously included metoprolol 100 mg daily, spironolactone 25 mg daily, torsemide 10 mg daily, entresto 24-26 mg BID; however, patient reports that he has not taken any medications for the past 2 months as reports he lost his insurance. [facility] his reduced EF was caused by atrial fibrillation, .does not appear to have  had any form of ischemic evaluation in the  past Repeat echo pending  On iv lasix currently, diuresing well Management per cardiology    Debility Family discussing putting him in ALF. Getting SW consult. ? Any chance he qualifies for social security disability? Not only for ALF purposes but maybe for medicare purposes?  Morbid obesity ([place] Body mass index is 47.64 kg/m. OSA cpap qhs  New onset DM A1C 7.2 If new onset DM: would love to get him on Mounjaro or Ozempic, but with no insurance, cost is going to be prohibitive. Diabetes education        I have Reviewed nursing notes, Vitals, pain scores, I/o's, Lab results and  imaging results since pt's last encounter, details please see discussion above  I ordered the following labs:  Unresulted Labs (From admission, onward)     Start     Ordered   09/02/21 08916 Basic metabolic panel  Daily at 5am,   R     Comments: As Scheduled for 5 days    09/01/21 2045             DVT prophylaxis: rivaroxaban (XARELTO) tablet 20 mg Start: 09/01/21 1800 rivaroxaban (XARELTO) tablet 20 mg   Code Status:   Code Status: Full Code  Family Communication: Patient Disposition:     Dispo: The patient is from: Home              Anticipated d/c is to: Home              Anticipated d/c date is: possible tomorrow , need  cardiology clearance   Antimicrobials:    Anti-infectives (From admission, onward)    None          Objective: Vitals:   09/02/21 0030 09/02/21 0200 09/02/21 0400 09/02/21 0600  BP: 110/78 114/81 113/64 (!) 120/93  Pulse: 99  88 86  Resp: 18 16 (!) 26 (!) 23  Temp:    98.4 F (36.9 C)  TempSrc:    Oral  SpO2: 93% 91% 92% 95%  Weight:      Height:        Intake/Output Summary (Last 24 hours) at 09/02/2021 0719 Last data filed at 09/01/2021 2235 Gross per 24 hour  Intake --  Output 2050 ml  Net -2050 ml   Filed Weights   09/01/21 1241  Weight: (!) 150.6 kg    Examination:  General exam: Anxious, pressured speech Respiratory system:  Intermittent tachypnea, no accessory muscle use, overall respiratory effort appears normal. Cardiovascular system:  IRRR.  Gastrointestinal system: Abdomen is nondistended, soft and nontender.  Normal bowel sounds heard. Central nervous system: Alert and oriented. No focal neurological deficits. Extremities:  no edema Skin: No rashes, lesions or ulcers Psychiatry: Anxious.     Data Reviewed: I have personally reviewed  labs and visualized  imaging studies since the last encounter and formulate the plan        Scheduled Meds:  furosemide  40 mg Intravenous BID   metoprolol tartrate  50 mg Oral BID   rivaroxaban  20 mg Oral Q supper   Continuous Infusions:  diltiazem (CARDIZEM) infusion Stopped (09/01/21 2020)     LOS: 0 days     Florencia Reasons, MD PhD FACP [facility]  Available via Epic secure chat 7am-7pm for nonurgent issues Please page for urgent issues To page the attending provider between 7A-7P or the covering provider during after hours 7P-7A, please log into the web site [email or web address] and access using universal [place] password for that web site. If you do not have the password, please call the hospital operator.    09/02/2021, 7:19 AM

## 2021-09-02 NOTE — ED Notes (Signed)
Pt c/o left side chest pain. ECG done at this time. Dr. Erlinda Hong, Annamaria Boots Notified

## 2021-09-02 NOTE — ED Notes (Signed)
Pt is upset, complaining about room temp and ED stay. States he doesn't want to "pay $30k/night for treatment I can do at home", wants to "leave AMA because I can do better for himself at home, but will end up back here anyway because I can't afford my meds when I get out of here". Room temp adjusted, Dr. Alcario Drought notified of pt's concerns, as well as SW needs.

## 2021-09-02 NOTE — ED Notes (Signed)
Pt anxious and requesting to leave AMA. Dr. Florencia Reasons aware.

## 2021-09-02 NOTE — Progress Notes (Addendum)
Patient refusing lab draws and medication. Patient A&OX 4.Patient states we are doing unnecessary blood draws on him and he needs to go. Patient educated on need for labs and medication patient still insists on leaving. Offered to cluster care from now forward patient still wants to leave Md made aware.Patient aware of consequences of AMA decisions.

## 2021-09-02 NOTE — Progress Notes (Signed)
Patient refused use of CPAP for the evening 

## 2021-09-02 NOTE — ED Notes (Signed)
Spoke to Dr. Florencia Reasons on the phone. Orders received. Discussed the nitro with pt. PT states " Why are we changing it up. They gave me Morphine yesterday. I am not to thrilled about taking the nitro."

## 2021-09-02 NOTE — ED Notes (Signed)
Pt requesting providers for referrals be in the [place] network. Pt agreeable to treatment at this time.

## 2021-09-02 NOTE — Progress Notes (Signed)
Progress Note  Patient Name: William Fischer Date of Encounter: 09/02/2021  [facility] Cardiologist: Thompson Grayer, MD   Subjective   2.1 L urine output recorded.  Creatinine stable at 1.1.  BP 120/93 this morning.  Remains short of breath but reports has improved  Inpatient Medications    Scheduled Meds:  furosemide  40 mg Intravenous BID   metoprolol tartrate  50 mg Oral BID   rivaroxaban  20 mg Oral Q supper   Continuous Infusions:  diltiazem (CARDIZEM) infusion Stopped (09/01/21 2020)   PRN Meds: acetaminophen, ondansetron (ZOFRAN) IV   Vital Signs    Vitals:   09/02/21 0030 09/02/21 0200 09/02/21 0400 09/02/21 0600  BP: 110/78 114/81 113/64 (!) 120/93  Pulse: 99  88 86  Resp: 18 16 (!) 26 (!) 23  Temp:    98.4 F (36.9 C)  TempSrc:    Oral  SpO2: 93% 91% 92% 95%  Weight:      Height:        Intake/Output Summary (Last 24 hours) at 09/02/2021 0751 Last data filed at 09/01/2021 2235 Gross per 24 hour  Intake --  Output 2050 ml  Net -2050 ml      09/01/2021   12:41 PM 12/04/2017    3:18 PM 11/13/2017    6:20 PM  Last 3 Weights  Weight (lbs) 332 lb 330 lb 12.8 oz 327 lb 2.6 oz  Weight (kg) 150.594 kg 150.05 kg 148.4 kg      Telemetry    Atrial fibrillation with rates 80s to 110s- Personally Reviewed  ECG    No new ECG- Personally Reviewed  Physical Exam   GEN: No acute distress.   Neck: + JVD Cardiac: Irregular, tachycardic, no murmurs, rubs, or gallops.  Respiratory: Diminished at bases GI: Soft, nontender, non-distended  MS: No edema; No deformity. Neuro:  Nonfocal  Psych: Normal affect   Labs    High Sensitivity Troponin:   Recent Labs  Lab 09/01/21 1244 09/01/21 1503  TROPONINIHS 20* 19*     Chemistry Recent Labs  Lab 09/01/21 1244 09/01/21 2215  NA 137 138  K 4.6 4.6  CL 101 103  CO2 27 21*  GLUCOSE 184* 183*  BUN 18 17  CREATININE 1.09 1.14  CALCIUM 9.2 8.9  MG 2.1 1.9  PROT  --  7.4  ALBUMIN  --  3.7  AST   --  22  ALT  --  21  ALKPHOS  --  52  BILITOT  --  1.2  GFRNONAA >60 >60  ANIONGAP 9 14    Lipids No results for input(s): "CHOL", "TRIG", "HDL", "LABVLDL", "LDLCALC", "CHOLHDL" in the last 168 hours.  Hematology Recent Labs  Lab 09/01/21 1244 09/01/21 1946  WBC 9.6 11.5*  RBC 5.58 5.68  HGB 17.4* 17.7*  HCT 52.4* 54.1*  MCV 93.9 95.2  MCH 31.2 31.2  MCHC 33.2 32.7  RDW 14.5 14.6  PLT 248 264   Thyroid No results for input(s): "TSH", "FREET4" in the last 168 hours.  BNP Recent Labs  Lab 09/01/21 1244  BNP 107.6*    DDimer No results for input(s): "DDIMER" in the last 168 hours.   Radiology    DG Chest Portable 1 View  Result Date: 09/01/2021 CLINICAL DATA:  Shortness of breath and left chest pain EXAM: PORTABLE CHEST 1 VIEW COMPARISON:  November 05, 2017, January 28, 2015 FINDINGS: Unchanged cardiomegaly.  Unchanged mediastinal contours. Bilateral reticular pulmonary opacities superimposed on a background of  chronic basilar scarring/fibrotic changes. Probable small left-greater-than-right pleural effusions. No large pneumothorax. No acute osseous abnormality. The visualized upper abdomen is unremarkable. IMPRESSION: 1. Mild pulmonary edema superimposed on a background of chronic basilar scarring/fibrotic changes. 2. Unchanged cardiomegaly. Electronically Signed   By: Beryle Flock M.D.   On: 09/01/2021 13:05    Cardiac Studies     Patient Profile     56 y.o. male with a history of atrial fibrillation, HTN, mesenteric embolus, OSA, chronic systolic heart failure who is being seen 09/01/2021 for the evaluation of atrial fibrillation  Assessment & Plan    Atrial fibrillation: Likely permanent A-fib.  Had ablation in 2021.  Has failed multiple antiarrhythmics.  Presented with A-fib with RVR.  Had been off all his medications x2 months -Continue Xarelto.  Social work consult as patient unable to afford his medications -Rates appear reasonably controlled, continue  metoprolol 50 mg twice daily  Acute on chronic combined systolic and diastolic heart failure: Most recent echocardiogram at [facility] 12/2020 showed EF 45%.  Presented with volume overload, likely due to being off his medications -Continue IV Lasix 40 mg twice daily -Continue metoprolol -Will follow-up echocardiogram today and fold in additional GDMT as tolerated  Elevated troponin: 20 > 19.  Reports sharp left-sided chest pain.  Not consistent with acute coronary syndrome.  Likely demand ischemia in setting of decompensated heart failure and A-fib with RVR as above    For questions or updates, please contact [place] Please consult [email or web address] for contact info under        Signed, Donato Heinz, MD  09/02/2021, 7:51 AM

## 2021-09-03 NOTE — Discharge Summary (Signed)
Discharge Summary  SON BARKAN GDJ:[id number] DOB: Oct 25, 1965  PCP: Pcp, No  Admit date: 09/01/2021 Discharge date: 9:24pm on 09/02/2021  Note charge note, patient left against medical advise on 8/17 night around 9-10pm, I was not present, overnight coverage notified       Discharge Diagnoses:  Active Hospital Problems   Diagnosis Date Noted   Atrial fibrillation with rapid ventricular response ([place]) 12/08/2011    Priority: High   Acute on chronic combined systolic (congestive) and diastolic (congestive) heart failure (HCC)     Priority: High   Debility 09/01/2021    Priority: Medium    Morbid obesity ([place]) 12/03/2011    Priority: Low   A-fib ([place]) 09/02/2021    Resolved Hospital Problems   Diagnosis Date Noted Date Resolved   Atrial fibrillation with RVR ([place]) 09/01/2021 09/01/2021    Discharge Condition: stable  Diet recommendation: heart healthy/carb modified  Filed Weights   09/01/21 1241 09/02/21 1655  Weight: (!) 150.6 kg 134.4 kg    History of present illness:  [facility] Course:  Principal Problem:   Atrial fibrillation with rapid ventricular response (HCC) Active Problems:   Acute on chronic combined systolic (congestive) and diastolic (congestive) heart failure (HCC)   Debility   Morbid obesity (HCC)   A-fib (HCC)   Assessment and Plan: * Atrial fibrillation with rapid ventricular response ([place]) Failed rhythm control multiple times in past (see HPI). Afib pathway Rate control: Metoprolol PO '15mg'$  cardizem bolus given IV Rate controlled currently in ED without needing to start cardizem gtt Will leave cardizem gtt on his orders list in case this is needed later Restart Xarelto SW consult for affordability  Acute on chronic combined systolic (congestive) and diastolic (congestive) heart failure ([place]) Most recent echocardiogram from [facility] on 12/31/2020 showed EF 45% Believed to be tachycardia induced, though never had ischemic eval. Patient's  regimen previously included metoprolol 100 mg daily, spironolactone 25 mg daily, torsemide 10 mg daily, entresto 24-26 mg BID; however, patient reports that he has not taken any medications for the past 2 months as reports he lost his insurance. CHF pathway Tele monitor Strict intake and output Daily BMP As per cards consult 2d echo Metoprolol '50mg'$  PO BID Lasix '40mg'$  IV BID Tele monitor  Debility Family discussing putting him in ALF. Getting SW consult. ? Any chance he qualifies for social security disability? Not only for ALF purposes but maybe for medicare purposes?  Morbid obesity (HCC) Check A1C. If new onset DM: would love to get him on Mounjaro or Ozempic, but with no insurance, cost is going to be prohibitive.    Discharge Exam: BP 112/73   Pulse (!) 103   Temp 98.4 F (36.9 C) (Oral)   Resp 10   Ht '5\' 10"'$  (1.778 m)   Wt 134.4 kg   SpO2 97%   BMI 42.51 kg/m       Allergies as of 09/02/2021       Reactions   Contrast Media [iodinated Contrast Media] Rash   a diffuse macular rash after CTA chest  Pt was premedicated with '125mg'$  IV Solumedrol, '50mg'$  IV Benadryl 1 hr prior to CTexam, and tolerated procedure without any difficulties. 11/28/11 Also same pre med protocol observed on 02/22/15 and pt tolerated the procedure well   Tramadol         Medication List     ASK your doctor about these medications    acetaminophen 500 MG tablet Commonly known as: TYLENOL Take 1,000 mg  by mouth every 6 (six) hours as needed (pain).   digoxin 0.125 MG tablet Commonly known as: LANOXIN TAKE 1 TABLET (0.125 MG TOTAL) BY MOUTH DAILY.   diltiazem 180 MG 24 hr capsule Commonly known as: CARDIZEM CD TAKE 1 CAPSULE BY MOUTH 2 TIMES DAILY. PLEASE KEEP UPCOMING APPT IN JANUARY WITH DR. ALLRED BEFORE ANYMORE REFILLS.   furosemide 40 MG tablet Commonly known as: LASIX TAKE 1 TABLET (40 MG TOTAL) BY MOUTH DAILY AS NEEDED FOR EDEMA.   lisinopril 5 MG tablet Commonly known as:  ZESTRIL TAKE 1 TABLET (5 MG TOTAL) BY MOUTH DAILY.   metoprolol tartrate 100 MG tablet Commonly known as: LOPRESSOR Take 1 tablet (100 mg total) by mouth 2 (two) times daily. Please keep upcoming appt in January with Dr. Rayann Heman before anymore refills. Thank you   Xarelto 20 MG Tabs tablet Generic drug: rivaroxaban TAKE 1 TABLET BY MOUTH DAILY WITH SUPPER       Allergies  Allergen Reactions   Contrast Media [Iodinated Contrast Media] Rash    a diffuse macular rash after CTA chest  Pt was premedicated with '125mg'$  IV Solumedrol, '50mg'$  IV Benadryl 1 hr prior to CTexam, and tolerated procedure without any difficulties. 11/28/11 Also same pre med protocol observed on 02/22/15 and pt tolerated the procedure well   Tramadol       The results of significant diagnostics from this hospitalization (including imaging, microbiology, ancillary and laboratory) are listed below for reference.    Significant Diagnostic Studies: ECHOCARDIOGRAM COMPLETE  Result Date: 09/02/2021    ECHOCARDIOGRAM REPORT   Patient Name:   MICHEAL MURAD Date of Exam: 09/02/2021 Medical Rec #:  [id number]        Height:       70.0 in Accession #:    [id number]       Weight:       332.0 lb Date of Birth:  1965-09-13         BSA:          2.589 m Patient Age:    56 years         BP:           117/99 mmHg Patient Gender: M                HR:           96 bpm. Exam Location:  Inpatient Procedure: 2D Echo, Cardiac Doppler, Color Doppler and Intracardiac            Opacification Agent Indications:    CHF  History:        Patient has prior history of Echocardiogram examinations, most                 recent 12/25/2017. CHF, Arrythmias:Atrial Fibrillation; Risk                 Factors:Hypertension.  Sonographer:    Memory Argue Referring Phys: [id number] Margie Billet  Sonographer Comments: Technically difficult study due to poor echo windows. IMPRESSIONS  1. Left ventricular ejection fraction, by estimation, is 35 to 40%. The left  ventricle has moderately decreased function. The left ventricle demonstrates global hypokinesis. The left ventricular internal cavity size was moderately dilated. There is mild  left ventricular hypertrophy. Left ventricular diastolic function could not be evaluated.  2. Right ventricular systolic function is mildly reduced. The right ventricular size is normal. There is moderately elevated pulmonary artery systolic pressure. The estimated right ventricular systolic pressure is  52.0 mmHg.  3. Left atrial size was severely dilated.  4. Right atrial size was moderately dilated.  5. The mitral valve is grossly normal. Mild mitral valve regurgitation.  6. The tricuspid valve is abnormal. Tricuspid valve regurgitation is mild to moderate.  7. The aortic valve was not well visualized. Aortic valve regurgitation is not visualized.  8. The inferior vena cava is dilated in size with <50% respiratory variability, suggesting right atrial pressure of 15 mmHg. Comparison(s): Changes from prior study are noted. 12/25/2017: LVEF 55-60%. FINDINGS  Left Ventricle: Left ventricular ejection fraction, by estimation, is 35 to 40%. The left ventricle has moderately decreased function. The left ventricle demonstrates global hypokinesis. Definity contrast agent was given IV to delineate the left ventricular endocardial borders. The left ventricular internal cavity size was moderately dilated. There is mild left ventricular hypertrophy. Left ventricular diastolic function could not be evaluated due to atrial fibrillation. Left ventricular diastolic function could not be evaluated. Right Ventricle: The right ventricular size is normal. No increase in right ventricular wall thickness. Right ventricular systolic function is mildly reduced. There is moderately elevated pulmonary artery systolic pressure. The tricuspid regurgitant velocity is 3.04 m/s, and with an assumed right atrial pressure of 15 mmHg, the estimated right ventricular systolic  pressure is 69.6 mmHg. Left Atrium: Left atrial size was severely dilated. Right Atrium: Right atrial size was moderately dilated. Pericardium: There is no evidence of pericardial effusion. Mitral Valve: The mitral valve is grossly normal. Mild mitral valve regurgitation. Tricuspid Valve: The tricuspid valve is abnormal. Tricuspid valve regurgitation is mild to moderate. Aortic Valve: The aortic valve was not well visualized. Aortic valve regurgitation is not visualized. Aortic valve mean gradient measures 3.0 mmHg. Aortic valve peak gradient measures 4.5 mmHg. Aortic valve area, by VTI measures 3.31 cm. Pulmonic Valve: The pulmonic valve was normal in structure. Pulmonic valve regurgitation is not visualized. Aorta: The aortic root and ascending aorta are structurally normal, with no evidence of dilitation. Venous: The inferior vena cava is dilated in size with less than 50% respiratory variability, suggesting right atrial pressure of 15 mmHg. IAS/Shunts: There is right bowing of the interatrial septum, suggestive of elevated left atrial pressure. No atrial level shunt detected by color flow Doppler.  LEFT VENTRICLE PLAX 2D LVIDd:         6.30 cm LVIDs:         5.30 cm LV PW:         1.10 cm LV IVS:        1.10 cm LVOT diam:     2.20 cm LV SV:         63 LV SV Index:   25 LVOT Area:     3.80 cm  RIGHT VENTRICLE RV S prime:     9.03 cm/s TAPSE (M-mode): 1.5 cm LEFT ATRIUM              Index        RIGHT ATRIUM           Index LA diam:        6.50 cm  2.51 cm/m   RA Area:     30.80 cm LA Vol (A2C):   138.0 ml 53.30 ml/m  RA Volume:   108.00 ml 41.71 ml/m LA Vol (A4C):   162.0 ml 62.57 ml/m LA Biplane Vol: 165.0 ml 63.73 ml/m  AORTIC VALVE AV Area (Vmax):    3.44 cm AV Area (Vmean):   3.23 cm AV Area (VTI):  3.31 cm AV Vmax:           106.00 cm/s AV Vmean:          74.800 cm/s AV VTI:            0.192 m AV Peak Grad:      4.5 mmHg AV Mean Grad:      3.0 mmHg LVOT Vmax:         95.80 cm/s LVOT Vmean:         63.600 cm/s LVOT VTI:          0.167 m LVOT/AV VTI ratio: 0.87  AORTA Ao Asc diam: 3.50 cm MITRAL VALVE               TRICUSPID VALVE MV Area (PHT): 3.48 cm    TR Peak grad:   37.0 mmHg MV Decel Time: 218 msec    TR Vmax:        304.00 cm/s MV E velocity: 49.20 cm/s MV A velocity: 47.60 cm/s  SHUNTS MV E/A ratio:  1.03        Systemic VTI:  0.17 m                            Systemic Diam: 2.20 cm Lyman Bishop MD Electronically signed by Lyman Bishop MD Signature Date/Time: 09/02/2021/4:35:56 PM    Final    DG Chest Portable 1 View  Result Date: 09/01/2021 CLINICAL DATA:  Shortness of breath and left chest pain EXAM: PORTABLE CHEST 1 VIEW COMPARISON:  November 05, 2017, January 28, 2015 FINDINGS: Unchanged cardiomegaly.  Unchanged mediastinal contours. Bilateral reticular pulmonary opacities superimposed on a background of chronic basilar scarring/fibrotic changes. Probable small left-greater-than-right pleural effusions. No large pneumothorax. No acute osseous abnormality. The visualized upper abdomen is unremarkable. IMPRESSION: 1. Mild pulmonary edema superimposed on a background of chronic basilar scarring/fibrotic changes. 2. Unchanged cardiomegaly. Electronically Signed   By: Beryle Flock M.D.   On: 09/01/2021 13:05    Microbiology: Recent Results (from the past 240 hour(s))  SARS Coronavirus 2 by RT PCR (hospital order, performed in [facility] hospital lab) *cepheid single result test* Anterior Nasal Swab     Status: None   Collection Time: 09/01/21 12:49 PM   Specimen: Anterior Nasal Swab  Result Value Ref Range Status   SARS Coronavirus 2 by RT PCR NEGATIVE NEGATIVE Final    Comment: (NOTE) SARS-CoV-2 target nucleic acids are NOT DETECTED.  The SARS-CoV-2 RNA is generally detectable in upper and lower respiratory specimens during the acute phase of infection. The lowest concentration of SARS-CoV-2 viral copies this assay can detect is 250 copies / mL. A negative result does not  preclude SARS-CoV-2 infection and should not be used as the sole basis for treatment or other patient management decisions.  A negative result may occur with improper specimen collection / handling, submission of specimen other than nasopharyngeal swab, presence of viral mutation(s) within the areas targeted by this assay, and inadequate number of viral copies (<250 copies / mL). A negative result must be combined with clinical observations, patient history, and epidemiological information.  Fact Sheet for Patients:   https://www.patel.info/  Fact Sheet for Healthcare Providers: https://hall.com/  This test is not yet approved or  cleared by the [place] FDA and has been authorized for detection and/or diagnosis of SARS-CoV-2 by FDA under an Emergency Use Authorization (EUA).  This EUA will remain in effect (meaning this test can be  used) for the duration of the COVID-19 declaration under Section 564(b)(1) of the Act, 21 U.S.C. section 360bbb-3(b)(1), unless the authorization is terminated or revoked sooner.  Performed at [facility], [place] [address], [place], [place] [address]      Labs: Basic Metabolic Panel: Recent Labs  Lab 09/01/21 1244 09/01/21 2215  NA 137 138  K 4.6 4.6  CL 101 103  CO2 27 21*  GLUCOSE 184* 183*  BUN 18 17  CREATININE 1.09 1.14  CALCIUM 9.2 8.9  MG 2.1 1.9   Liver Function Tests: Recent Labs  Lab 09/01/21 2215  AST 22  ALT 21  ALKPHOS 52  BILITOT 1.2  PROT 7.4  ALBUMIN 3.7   No results for input(s): "LIPASE", "AMYLASE" in the last 168 hours. No results for input(s): "AMMONIA" in the last 168 hours. CBC: Recent Labs  Lab 09/01/21 1244 09/01/21 1946  WBC 9.6 11.5*  HGB 17.4* 17.7*  HCT 52.4* 54.1*  MCV 93.9 95.2  PLT 248 264   Cardiac Enzymes: No results for input(s): "CKTOTAL", "CKMB", "CKMBINDEX", "TROPONINI" in the last 168 hours. BNP: BNP (last 3 results) Recent Labs     09/01/21 1244  BNP 107.6*    ProBNP (last 3 results) No results for input(s): "PROBNP" in the last 8760 hours.  CBG: Recent Labs  Lab 09/01/21 1319  GLUCAP 179*    FURTHER DISCHARGE INSTRUCTIONS:   Get Medicines reviewed and adjusted: Please take all your medications with you for your next visit with your Primary MD   Laboratory/radiological data: Please request your Primary MD to go over all hospital tests and procedure/radiological results at the follow up, please ask your Primary MD to get all Hospital records sent to his/her office.   In some cases, they will be blood work, cultures and biopsy results pending at the time of your discharge. Please request that your primary care M.D. goes through all the records of your hospital data and follows up on these results.   Also Note the following: If you experience worsening of your admission symptoms, develop shortness of breath, life threatening emergency, suicidal or homicidal thoughts you must seek medical attention immediately by calling 911 or calling your MD immediately  if symptoms less severe.   You must read complete instructions/literature along with all the possible adverse reactions/side effects for all the Medicines you take and that have been prescribed to you. Take any new Medicines after you have completely understood and accpet all the possible adverse reactions/side effects.    Do not drive when taking Pain medications or sleeping medications (Benzodaizepines)   Do not take more than prescribed Pain, Sleep and Anxiety Medications. It is not advisable to combine anxiety,sleep and pain medications without talking with your primary care practitioner   Special Instructions: If you have smoked or chewed Tobacco  in the last 2 yrs please stop smoking, stop any regular Alcohol  and or any Recreational drug use.   Wear Seat belts while driving.   Please note: You were cared for by a hospitalist during your hospital  stay. Once you are discharged, your primary care physician will handle any further medical issues. Please note that NO REFILLS for any discharge medications will be authorized once you are discharged, as it is imperative that you return to your primary care physician (or establish a relationship with a primary care physician if you do not have one) for your post hospital discharge needs so that they can reassess your need for  medications and monitor your lab values.     Signed:  Florencia Reasons MD, PhD, FACP  [facility] 09/03/2021, 12:53 PM

## 2021-09-08 ENCOUNTER — Telehealth: Payer: Self-pay | Admitting: Cardiology

## 2021-09-08 NOTE — Telephone Encounter (Signed)
Called to schedule hospital f/u in 2 weeks with Dr. Gardiner Rhyme or an APP. Patient states that he does not have health insurance and is not working right now so declined to schedule.

## 2021-09-17 ENCOUNTER — Encounter: Payer: Self-pay | Admitting: Cardiology

## 2021-09-17 ENCOUNTER — Telehealth: Payer: Self-pay | Admitting: Cardiology

## 2021-09-17 DIAGNOSIS — Z79899 Other long term (current) drug therapy: Secondary | ICD-10-CM

## 2021-09-17 DIAGNOSIS — I4891 Unspecified atrial fibrillation: Secondary | ICD-10-CM

## 2021-09-17 DIAGNOSIS — I5043 Acute on chronic combined systolic (congestive) and diastolic (congestive) heart failure: Secondary | ICD-10-CM

## 2021-09-17 DIAGNOSIS — I482 Chronic atrial fibrillation, unspecified: Secondary | ICD-10-CM

## 2021-09-17 MED ORDER — RIVAROXABAN 20 MG PO TABS: 20.0000 mg | ORAL_TABLET | Freq: Every day | ORAL | 0 refills | Status: DC

## 2021-09-17 NOTE — Telephone Encounter (Signed)
*  STAT* If patient is at the pharmacy, call can be transferred to refill team.   1. Which medications need to be refilled? (please list name of each medication and dose if known)   XARELTO 20 MG TABS tablet  metoprolol tartrate (LOPRESSOR) 100 MG tablet lisinopril (ZESTRIL) 5 MG tablet furosemide (LASIX) 40 MG tablet diltiazem (CARDIZEM CD) 180 MG 24 hr capsule digoxin (LANOXIN) 0.125 MG tablet 2. Which pharmacy/location (including street and city if local pharmacy) is medication to be sent to? [id number]- [place], [place] - [address]  3. Do they need a 30 day or 90 day supply? [phone]  Pt requesting a refill on all medications until his appt with Dr. SGardiner Rhyme11/6/23. Pt did not want to see APP sooner.

## 2021-09-17 NOTE — Telephone Encounter (Signed)
error 

## 2021-09-22 ENCOUNTER — Telehealth: Payer: Self-pay

## 2021-09-22 MED ORDER — TORSEMIDE 10 MG PO TABS: 10.0000 mg | ORAL_TABLET | Freq: Every day | ORAL | 0 refills | Status: DC

## 2021-09-22 MED ORDER — AMIODARONE HCL 100 MG PO TABS: 100.0000 mg | ORAL_TABLET | Freq: Every day | ORAL | 0 refills | Status: DC

## 2021-09-22 MED ORDER — SPIRONOLACTONE 25 MG PO TABS: 25.0000 mg | ORAL_TABLET | Freq: Every day | ORAL | 0 refills | Status: DC

## 2021-09-22 MED ORDER — METOPROLOL TARTRATE 100 MG PO TABS: 100.0000 mg | ORAL_TABLET | Freq: Two times a day (BID) | ORAL | 0 refills | Status: DC

## 2021-09-22 NOTE — Telephone Encounter (Signed)
**Note De-Identified Barrett Goldie Obfuscation** Xarelto PA started through covermymeds. Key: [id number]

## 2021-09-22 NOTE — Telephone Encounter (Signed)
Unfortunately he left AMA while in hospital so his meds were not prescribed on discharge.  OK to refill the meds he lists for short supply until he comes in for appointment, but will need BMET/mag in 1 week after restarting these meds

## 2021-10-15 ENCOUNTER — Other Ambulatory Visit: Payer: Self-pay | Admitting: Cardiology

## 2021-11-21 NOTE — Progress Notes (Unsigned)
Cardiology Office Note:    Date:  11/23/2021   ID:  William Fischer, DOB 1965-11-11, MRN [id number]  PCP:  Pcp, No  Cardiologist:  Thompson Grayer, MD  Electrophysiologist:  None   Referring MD: No ref. provider found   Chief Complaint  Patient presents with   Congestive Heart Failure    History of Present Illness:    William Fischer is a 56 y.o. male with a hx of chronic combined systolic and diastolic heart failure, atrial fibrillation, hypertension, mesenteric embolus, colonic perforation in 2016 due to diverticulitis status post hemicolectomy and colostomy that was later reversed, former tobacco use, OSA who presents for follow-up.  He was hospitalized 08/2021 with acute on chronic combined heart failure.  Prior echocardiogram 12/2020 had shown EF 45%.  During admission 08/2021, echo showed EF 35 to 94%, mild RV systolic dysfunction, RVSP 52, severe left atrial enlargement, moderate right atrial enlargement, no significant valvular disease.  He was diuresed with IV Lasix, but unfortunately left AMA.  He had been off all his medicines for 2 months and is unable to afford.  Previously followed with Dr. Rayann Heman in EP, last seen 11/2017.  At that time was felt to likely have permanent A-fib.  However subsequently followed up with EP at [facility] underwent A-fib ablation 03/2019 and was started on amiodarone.  To maintain sinus rhythm for 4 to 5 months but then back to A-fib.  He was following with advanced heart failure at [facility], last seen 12/2020.  At that time, medications included amiodarone 100 mg daily, Toprol-XL 100 mg daily, rosuvastatin 10 mg daily, spironolactone 25 mg daily, torsemide 10 mg daily, Xarelto 20 mg daily, Entresto 24-26 mg twice daily.  Since discharge from the hospital, he reports he is doing okay.  Denies any chest pain.  Does report feeling shortness of breath with minimal exertion.  Denies any lightheadedness, syncope, lower extremity edema, or palpitations.     Wt Readings from Last 3 Encounters:  11/22/21 (!) 310 lb 3.2 oz (140.7 kg)  09/02/21 296 lb 4.8 oz (134.4 kg)  12/04/17 (!) 330 lb 12.8 oz (150 kg)       Past Medical History:  Diagnosis Date   Allergy to IVP dye    Atrial fibrillation (HCC)    admx 11/13 with acute sCHF in setting of RVR  => a. failed DCCV x 2; b. Pradaxa started;  c. failed sotalol   Chicken pox as a child   Chronic anticoagulation    Pradaxa   Chronic systolic heart failure ([place])    a. echo 11/13: Ef 40-45%, diff HK, mod MR, mod LAE, mild RVE, mod RAE, small effusion;   b. TEE 11/13:  EF 35-40%, no LAA clot; Echo 2/14 shows normal EF   Depression with anxiety 06/29/2011   Hypertension    Mesenteric embolus ([place]) 12/2014   Migraine 06/29/2011   Obesity 06/29/2011   Snoring    patient needs sleep study - has declined    Past Surgical History:  Procedure Laterality Date   CARDIOVERSION  11/30/2011   Procedure: CARDIOVERSION;  Surgeon: Thayer Headings, MD;  Location: [facility] ENDOSCOPY;  Service: Cardiovascular;  Laterality: N/A;   CARDIOVERSION  12/03/2011   Procedure: CARDIOVERSION;  Surgeon: Jolaine Artist, MD;  Location: [facility] OR;  Service: Cardiovascular;  Laterality: N/A;   COLECTOMY  12/07/2015   partial   COLON SURGERY     COLOSTOMY REVERSAL  12/07/2015   COLOSTOMY REVERSAL N/A 12/07/2015  Procedure: OPEN REVERSAL OF COLOSTOMY WITH PARTIAL SIGMOID COLECTOMY;  Surgeon: Leighton Ruff, MD;  Location: [place];  Service: General;  Laterality: N/A;   William Fischer  12/07/2015   INCISIONAL HERNIA REPAIR N/A 12/07/2015   Procedure: INCISIONAL HERNIA REPAIR;  Surgeon: Leighton Ruff, MD;  Location: [place];  Service: General;  Laterality: N/A;   IR GENERIC HISTORICAL  08/11/2015   IR RADIOLOGIST EVAL & MGMT 08/11/2015 Markus Daft, MD [facility] INTERV RAD   IR GENERIC HISTORICAL  11/18/2015   IR CATHETER TUBE CHANGE 11/18/2015 Corrie Mckusick, DO [facility]   IR GENERIC HISTORICAL  10/15/2015    IR RADIOLOGIST EVAL & MGMT 10/15/2015 Jacqulynn Cadet, MD [facility] INTERV RAD   IR GENERIC HISTORICAL  01/27/2016   IR RADIOLOGIST EVAL & MGMT 01/27/2016 Ascencion Dike, PA-C [facility] INTERV RAD   IR GENERIC HISTORICAL  01/28/2016   IR CATHETER TUBE CHANGE 01/28/2016 Markus Daft, MD [facility]   IR GENERIC HISTORICAL  02/17/2016   IR RADIOLOGIST EVAL & MGMT 02/17/2016 Ascencion Dike, PA-C [facility] INTERV RAD   LAPAROTOMY N/A 01/17/2015   Procedure: EXPLORATORY LAPAROTOMY WITH LEFT COLECTOMY AND COLOSTOMY;  Surgeon: Rolm Bookbinder, MD;  Location: [place];  Service: General;  Laterality: N/A;   [place]  11/2015   TEE WITHOUT CARDIOVERSION  11/30/2011   Procedure: TRANSESOPHAGEAL ECHOCARDIOGRAM (TEE);  Surgeon: Thayer Headings, MD;  Location: [facility] ENDOSCOPY;  Service: Cardiovascular;  Laterality: N/A;   TONSILLECTOMY      Current Medications: Current Meds  Medication Sig   acetaminophen (TYLENOL) 500 MG tablet Take 1,000 mg by mouth every 6 (six) hours as needed (pain).   dapagliflozin propanediol (FARXIGA) 10 MG TABS tablet Take 1 tablet (10 mg total) by mouth daily.   losartan (COZAAR) 50 MG tablet Take 1 tablet (50 mg total) by mouth daily.   metoprolol succinate (TOPROL-XL) 50 MG 24 hr tablet Take 50 mg by mouth daily. Take with or immediately following a meal.   rivaroxaban (XARELTO) 20 MG TABS tablet Take 1 tablet (20 mg total) by mouth daily with supper.   spironolactone (ALDACTONE) 25 MG tablet Take 1 tablet (25 mg total) by mouth daily.   torsemide (DEMADEX) 10 MG tablet Take 1 tablet (10 mg total) by mouth daily.   [DISCONTINUED] amiodarone (PACERONE) 100 MG tablet Take 1 tablet (100 mg total) by mouth daily.   [DISCONTINUED] lisinopril (ZESTRIL) 10 MG tablet Take 10 mg by mouth daily.     Allergies:   Contrast media [iodinated contrast media] and Tramadol   Social History   Socioeconomic History   Marital status: Divorced    Spouse name: Not on  file   Number of children: Not on file   Years of education: Not on file   Highest education level: Not on file  Occupational History   Not on file  Tobacco Use   Smoking status: Former    Packs/day: 1.00    Years: 20.00    Total pack years: 20.00    Types: Cigarettes    Quit date: 07/10/2014    Years since quitting: 7.3   Smokeless tobacco: Never  Substance and Sexual Activity   Alcohol use: No    Alcohol/week: 0.0 standard drinks of alcohol   Drug use: No   Sexual activity: Never  Other Topics Concern   Not on file  Social History Narrative   Not on file   Social Determinants of Health  Financial Resource Strain: Not on file  Food Insecurity: Not on file  Transportation Needs: Not on file  Physical Activity: Not on file  Stress: Not on file  Social Connections: Not on file     Family History: The patient's family history includes Alcohol abuse in his father and paternal grandfather; Aneurysm in his father; Cancer in his maternal grandmother and paternal grandmother; Diabetes in his maternal grandfather and paternal grandmother; Hypertension in his father and mother; Parkinsonism in his maternal grandfather.  ROS:   Please see the history of present illness.     All other systems reviewed and are negative.  EKGs/Labs/Other Studies Reviewed:    The following studies were reviewed today:   EKG:   11/22/2021: Atrial fibrillation, low voltage, Q waves V1-4  Recent Labs: 09/01/2021: B Natriuretic Peptide 107.6 11/22/2021: ALT 25; BUN 13; Creatinine, Ser 0.97; Hemoglobin 17.1; Magnesium 2.0; Platelets 300; Potassium 4.8; Sodium 137  Recent Lipid Panel    Component Value Date/Time   CHOL 155 11/22/2021 1052   TRIG 73 11/22/2021 1052   HDL 55 11/22/2021 1052   CHOLHDL 2.8 11/22/2021 1052   CHOLHDL 3 07/16/2013 1412   VLDL 16.0 07/16/2013 1412   LDLCALC 86 11/22/2021 1052    Physical Exam:    VS:  BP 120/76   Pulse 96   Ht '5\' 10"'$  (1.778 m)   Wt (!) 310 lb  3.2 oz (140.7 kg)   SpO2 98%   BMI 44.51 kg/m     Wt Readings from Last 3 Encounters:  11/22/21 (!) 310 lb 3.2 oz (140.7 kg)  09/02/21 296 lb 4.8 oz (134.4 kg)  12/04/17 (!) 330 lb 12.8 oz (150 kg)     GEN:  Well nourished, well developed in no acute distress HEENT: Normal NECK: No JVD; No carotid bruits CARDIAC: RRR, no murmurs, rubs, gallops RESPIRATORY:  Clear to auscultation without rales, wheezing or rhonchi  ABDOMEN: Soft, non-tender, non-distended MUSCULOSKELETAL:  No edema; No deformity  SKIN: Warm and dry NEUROLOGIC:  Alert and oriented x 3 PSYCHIATRIC:  Normal affect   ASSESSMENT:    1. Chronic combined systolic and diastolic heart failure ([place])   2. Permanent atrial fibrillation ([place])   3. Essential hypertension   4. Lipid screening    PLAN:    Chronic combined heart failure: EF 35 to 40% on echo 08/2021.  Previously followed with advanced heart failure and [place] thought to be tachycardia induced from A-fib.  However has not had ischemic evaluation -Currently on lisinopril 10 mg daily.  Will switch to losartan 50 mg daily in anticipation of switching to Entresto -Continue spironolactone 25 mg daily -Continue toprol XL 50 mg BID -Add Jardiance 10 mg daily -Continue torsemide 10 mg daily.  Appears euvolemic.  Will check CMET, magnesium -Schedule follow-up in pharmacy heart failure clinic in 2 weeks for further titration of GDMT -Plan repeat echocardiogram in 3 months.  Cardiomyopathy has been felt to be due to A-fib but he has not had ischemic evaluation.  If EF remains reduced, will plan ischemic evaluation.  If ischemic evaluation unremarkable, would plan cardiac MRI  Atrial fibrillation: Appears prominent.  Rates appear reasonably controlled in clinic today -Continue Xarelto -Continue metoprolol.  Check Zio patch x3 days to evaluate for rate control -Discontinue Amiodarone as appears chronic Afib.  Hypertension: Continue metoprolol and  spironolactone, and will change lisinopril to losartan as above  OSA: reports compliance with CPAP  T2DM: A1c 7.2% on 09/01/2021.  He is  not on any medications for diabetes.  Will start Jardiance as above.  We will given list of PCPs and encouraged to establish with PCP  Lipid screening: We will need to start statin given has T2DM.  Check lipid panel   RTC in 3 months   Medication Adjustments/Labs and Tests Ordered: Current medicines are reviewed at length with the patient today.  Concerns regarding medicines are outlined above.  Orders Placed This Encounter  Procedures   Comprehensive metabolic panel   CBC   Magnesium   Lipid panel   LONG TERM MONITOR (3-14 DAYS)   ECHOCARDIOGRAM COMPLETE   Meds ordered this encounter  Medications   losartan (COZAAR) 50 MG tablet    Sig: Take 1 tablet (50 mg total) by mouth daily.    Dispense:  90 tablet    Refill:  3   dapagliflozin propanediol (FARXIGA) 10 MG TABS tablet    Sig: Take 1 tablet (10 mg total) by mouth daily.    Dispense:  90 tablet    Refill:  3    Patient Instructions  Medication Instructions:  STOP amiodarone STOP Lisinopril  START losartan 50 mg daily START Farxiga 10 mg daily  *If you need a refill on your cardiac medications before your next appointment, please call your pharmacy*   Lab Work: CMET, CBC, Mag, Lipid today  If you have labs (blood work) drawn today and your tests are completely normal, you will receive your results only by: [place] (if you have MyChart) OR A paper copy in the mail If you have any lab test that is abnormal or we need to change your treatment, we will call you to review the results.   Testing/Procedures: Your physician has requested that you have an echocardiogram in 3 MONTHS. Echocardiography is a painless test that uses sound waves to create images of your heart. It provides your doctor with information about the size and shape of your heart and how well your heart's  chambers and valves are working. This procedure takes approximately one hour. There are no restrictions for this procedure. Please do NOT wear cologne, perfume, aftershave, or lotions (deodorant is allowed). Please arrive 15 minutes prior to your appointment time.   ZIO XT- Long Term Monitor Instructions   Your physician has requested you wear a ZIO patch monitor for _3__ days.  This is a single patch monitor.   [employer] supplies one patch monitor per enrollment. Additional stickers are not available. Please do not apply patch if you will be having a Nuclear Stress Test, Echocardiogram, Cardiac CT, MRI, or Chest Xray during the period you would be wearing the monitor. The patch cannot be worn during these tests. You cannot remove and re-apply the ZIO XT patch monitor.  Your ZIO patch monitor will be sent Fed Ex from [employer] directly to your home address. It may take 3-5 days to receive your monitor after you have been enrolled.  Once you have received your monitor, please review the enclosed instructions. Your monitor has already been registered assigning a specific monitor serial # to you.  Billing and Patient Assistance Program Information   We have supplied [employer] with any of your insurance information on file for billing purposes. [employer] offers a sliding scale Patient Assistance Program for patients that do not have insurance, or whose insurance does not completely cover the cost of the ZIO monitor.   You must apply for the Patient Assistance Program to qualify for this discounted rate.  To apply, please call [employer] at [phone], select option 4, then select option 2, and ask to apply for Patient Assistance Program.  Theodore Demark will ask your household income, and how many people are in your household.  They will quote your out-of-pocket cost based on that information.  [employer] will also be able to set up a 9-month interest-free payment plan if needed.  Applying the monitor    Shave hair from upper left chest.  Hold abrader disc by orange tab. Rub abrader in 40 strokes over the upper left chest as indicated in your monitor instructions.  Clean area with 4 enclosed alcohol pads. Let dry.  Apply patch as indicated in monitor instructions. Patch will be placed under collarbone on left side of chest with arrow pointing upward.  Rub patch adhesive wings for 2 minutes. Remove white label marked "1". Remove the white label marked "2". Rub patch adhesive wings for 2 additional minutes.  While looking in a mirror, press and release button in center of patch. A small green light will flash 3-4 times. This will be your only indicator that the monitor has been turned on. ?  Do not shower for the first 24 hours. You may shower after the first 24 hours.  Press the button if you feel a symptom. You will hear a small click. Record Date, Time and Symptom in the Patient Logbook.  When you are ready to remove the patch, follow instructions on the last 2 pages of the Patient Logbook. Stick patch monitor onto the last page of Patient Logbook.  Place Patient Logbook in the blue and white box.  Use locking tab on box and tape box closed securely.  The blue and white box has prepaid postage on it. Please place it in the mailbox as soon as possible. Your physician should have your test results approximately 7 days after the monitor has [facility]  Call [place] [phone] you have questions regarding your ZIO XT patch monitor. Call them immediately if you see an orange light blinking on your monitor.  If your monitor falls off in less than 4 days, contact our Monitor department at [phone] ?If your monitor becomes loose or falls off after 4 days call [employer] at [phone] suggestions on securing your monitor.?]  Follow-Up: [facility] you and your health needs are our priority.  As part of our continuing mission to  provide you with exceptional heart care, we have created designated Provider Care Teams.  These Care Teams include your primary Cardiologist (physician) and Advanced Practice Providers (APPs -  Physician Assistants and Nurse Practitioners) who all work together to provide you with the care you need, when you need it.  We recommend signing up for the patient portal called "MyChart".  Sign up information is provided on this After Visit Summary.  MyChart is used to connect with patients for Virtual Visits (Telemedicine).  Patients are able to view lab/test results, encounter notes, upcoming appointments, etc.  Non-urgent messages can be sent to your provider as well.   To learn more about what you can do with MyChart, go to [email or web address]    Your next appointment:   2 weeks with pharmacist (heart failure follow up) 3 month(s)  The format for your next appointment:   In Person  Provider:   Dr. SGardiner RhymeOther Instructions Please call to establish with a primary provider asap-list provided        Signed, CDoreatha Martin  Gardiner Rhyme, MD  11/23/2021 11:19 AM    [place]

## 2021-11-22 ENCOUNTER — Encounter: Payer: Self-pay | Admitting: Cardiology

## 2021-11-22 ENCOUNTER — Ambulatory Visit: Payer: Commercial Managed Care - HMO | Attending: Cardiology | Admitting: Cardiology

## 2021-11-22 ENCOUNTER — Ambulatory Visit: Payer: Commercial Managed Care - HMO | Attending: Cardiology

## 2021-11-22 VITALS — BP 120/76 | HR 96 | Ht 70.0 in | Wt 310.2 lb

## 2021-11-22 DIAGNOSIS — I10 Essential (primary) hypertension: Secondary | ICD-10-CM

## 2021-11-22 DIAGNOSIS — I4891 Unspecified atrial fibrillation: Secondary | ICD-10-CM

## 2021-11-22 DIAGNOSIS — Z13220 Encounter for screening for lipoid disorders: Secondary | ICD-10-CM

## 2021-11-22 DIAGNOSIS — I4821 Permanent atrial fibrillation: Secondary | ICD-10-CM

## 2021-11-22 DIAGNOSIS — I5042 Chronic combined systolic (congestive) and diastolic (congestive) heart failure: Secondary | ICD-10-CM

## 2021-11-22 MED ORDER — LOSARTAN POTASSIUM 50 MG PO TABS: 50.0000 mg | ORAL_TABLET | Freq: Every day | ORAL | 3 refills | Status: DC

## 2021-11-22 MED ORDER — DAPAGLIFLOZIN PROPANEDIOL 10 MG PO TABS: 10.0000 mg | ORAL_TABLET | Freq: Every day | ORAL | 3 refills | Status: DC

## 2021-11-22 NOTE — Progress Notes (Unsigned)
Enrolled for [employer] to mail a ZIO XT long term holter monitor to the patients address on file.   [id number] mailed to patient, applied in office.

## 2021-11-22 NOTE — Patient Instructions (Addendum)
Medication Instructions:  STOP amiodarone STOP Lisinopril  START losartan 50 mg daily START Farxiga 10 mg daily  *If you need a refill on your cardiac medications before your next appointment, please call your pharmacy*   Lab Work: CMET, CBC, Mag, Lipid today  If you have labs (blood work) drawn today and your tests are completely normal, you will receive your results only by: [place] (if you have MyChart) OR A paper copy in the mail If you have any lab test that is abnormal or we need to change your treatment, we will call you to review the results.   Testing/Procedures: Your physician has requested that you have an echocardiogram in 3 MONTHS. Echocardiography is a painless test that uses sound waves to create images of your heart. It provides your doctor with information about the size and shape of your heart and how well your heart's chambers and valves are working. This procedure takes approximately one hour. There are no restrictions for this procedure. Please do NOT wear cologne, perfume, aftershave, or lotions (deodorant is allowed). Please arrive 15 minutes prior to your appointment time.   ZIO XT- Long Term Monitor Instructions   Your physician has requested you wear a ZIO patch monitor for _3__ days.  This is a single patch monitor.   [employer] supplies one patch monitor per enrollment. Additional stickers are not available. Please do not apply patch if you will be having a Nuclear Stress Test, Echocardiogram, Cardiac CT, MRI, or Chest Xray during the period you would be wearing the monitor. The patch cannot be worn during these tests. You cannot remove and re-apply the ZIO XT patch monitor.  Your ZIO patch monitor will be sent Fed Ex from [employer] directly to your home address. It may take 3-5 days to receive your monitor after you have been enrolled.  Once you have received your monitor, please review the enclosed instructions. Your monitor has already been  registered assigning a specific monitor serial # to you.  Billing and Patient Assistance Program Information   We have supplied [employer] with any of your insurance information on file for billing purposes. [employer] offers a sliding scale Patient Assistance Program for patients that do not have insurance, or whose insurance does not completely cover the cost of the ZIO monitor.   You must apply for the Patient Assistance Program to qualify for this discounted rate.     To apply, please call [employer] at [phone], select option 4, then select option 2, and ask to apply for Patient Assistance Program.  Theodore Demark will ask your household income, and how many people are in your household.  They will quote your out-of-pocket cost based on that information.  [employer] will also be able to set up a 62-month interest-free payment plan if needed.  Applying the monitor   Shave hair from upper left chest.  Hold abrader disc by orange tab. Rub abrader in 40 strokes over the upper left chest as indicated in your monitor instructions.  Clean area with 4 enclosed alcohol pads. Let dry.  Apply patch as indicated in monitor instructions. Patch will be placed under collarbone on left side of chest with arrow pointing upward.  Rub patch adhesive wings for 2 minutes. Remove white label marked "1". Remove the white label marked "2". Rub patch adhesive wings for 2 additional minutes.  While looking in a mirror, press and release button in center of patch. A small green light will flash 3-4 times. This will be your  only indicator that the monitor has been turned on. ?  Do not shower for the first 24 hours. You may shower after the first 24 hours.  Press the button if you feel a symptom. You will hear a small click. Record Date, Time and Symptom in the Patient Logbook.  When you are ready to remove the patch, follow instructions on the last 2 pages of the Patient Logbook. Stick patch monitor onto the last page of Patient  Logbook.  Place Patient Logbook in the blue and white box.  Use locking tab on box and tape box closed securely.  The blue and white box has prepaid postage on it. Please place it in the mailbox as soon as possible. Your physician should have your test results approximately 7 days after the monitor has been mailed back to [facility].  Call [place] at [phone] if you have questions regarding your ZIO XT patch monitor. Call them immediately if you see an orange light blinking on your monitor.  If your monitor falls off in less than 4 days, contact our Monitor department at [phone]. ?If your monitor becomes loose or falls off after 4 days call [employer] at [phone] for suggestions on securing your monitor.?]  Follow-Up: At [facility], you and your health needs are our priority.  As part of our continuing mission to provide you with exceptional heart care, we have created designated Provider Care Teams.  These Care Teams include your primary Cardiologist (physician) and Advanced Practice Providers (APPs -  Physician Assistants and Nurse Practitioners) who all work together to provide you with the care you need, when you need it.  We recommend signing up for the patient portal called "MyChart".  Sign up information is provided on this After Visit Summary.  MyChart is used to connect with patients for Virtual Visits (Telemedicine).  Patients are able to view lab/test results, encounter notes, upcoming appointments, etc.  Non-urgent messages can be sent to your provider as well.   To learn more about what you can do with MyChart, go to [email or web address].    Your next appointment:   2 weeks with pharmacist (heart failure follow up) 3 month(s)  The format for your next appointment:   In Person  Provider:   Dr. Gardiner Rhyme Other Instructions Please call to establish with a primary provider asap-list provided

## 2021-11-23 ENCOUNTER — Other Ambulatory Visit: Payer: Self-pay | Admitting: *Deleted

## 2021-11-23 DIAGNOSIS — I5042 Chronic combined systolic (congestive) and diastolic (congestive) heart failure: Secondary | ICD-10-CM

## 2021-11-23 DIAGNOSIS — Z79899 Other long term (current) drug therapy: Secondary | ICD-10-CM

## 2021-11-23 LAB — COMPREHENSIVE METABOLIC PANEL
ALT: 25 IU/L (ref 0–44)
AST: 22 IU/L (ref 0–40)
Albumin/Globulin Ratio: 1.5 (ref 1.2–2.2)
Albumin: 4.4 g/dL (ref 3.8–4.9)
Alkaline Phosphatase: 70 IU/L (ref 44–121)
BUN/Creatinine Ratio: 13 (ref 9–20)
BUN: 13 mg/dL (ref 6–24)
Bilirubin Total: 0.5 mg/dL (ref 0.0–1.2)
CO2: 28 mmol/L (ref 20–29)
Calcium: 10.2 mg/dL (ref 8.7–10.2)
Chloride: 96 mmol/L (ref 96–106)
Creatinine, Ser: 0.97 mg/dL (ref 0.76–1.27)
Globulin, Total: 2.9 g/dL (ref 1.5–4.5)
Glucose: 172 mg/dL — ABNORMAL HIGH (ref 70–99)
Potassium: 4.8 mmol/L (ref 3.5–5.2)
Sodium: 137 mmol/L (ref 134–144)
Total Protein: 7.3 g/dL (ref 6.0–8.5)
eGFR: 93 mL/min/{1.73_m2} (ref >59)

## 2021-11-23 LAB — CBC
Hematocrit: 49.9 % (ref 37.5–51.0)
Hemoglobin: 17.1 g/dL (ref 13.0–17.7)
MCH: 31 pg (ref 26.6–33.0)
MCHC: 34.3 g/dL (ref 31.5–35.7)
MCV: 91 fL (ref 79–97)
Platelets: 300 10*3/uL (ref 150–450)
RBC: 5.51 x10E6/uL (ref 4.14–5.80)
RDW: 13.1 % (ref 11.6–15.4)
WBC: 8.4 10*3/uL (ref 3.4–10.8)

## 2021-11-23 LAB — LIPID PANEL
Chol/HDL Ratio: 2.8 ratio (ref 0.0–5.0)
Cholesterol, Total: 155 mg/dL (ref 100–199)
HDL: 55 mg/dL (ref >39)
LDL Chol Calc (NIH): 86 mg/dL (ref 0–99)
Triglycerides: 73 mg/dL (ref 0–149)
VLDL Cholesterol Cal: 14 mg/dL (ref 5–40)

## 2021-11-23 LAB — MAGNESIUM: Magnesium: 2 mg/dL (ref 1.6–2.3)

## 2021-11-23 MED ORDER — ROSUVASTATIN CALCIUM 20 MG PO TABS: 20.0000 mg | ORAL_TABLET | Freq: Every day | ORAL | 3 refills | Status: DC

## 2021-11-24 NOTE — Addendum Note (Signed)
Addended by: Hinton Dyer on: 11/24/2021 10:15 AM   Modules accepted: Orders

## 2021-12-06 ENCOUNTER — Telehealth: Payer: Self-pay | Admitting: Cardiology

## 2021-12-06 ENCOUNTER — Ambulatory Visit: Payer: Commercial Managed Care - HMO | Attending: Internal Medicine

## 2021-12-06 DIAGNOSIS — I4719 Other supraventricular tachycardia: Secondary | ICD-10-CM

## 2021-12-06 DIAGNOSIS — I4891 Unspecified atrial fibrillation: Secondary | ICD-10-CM | POA: Diagnosis not present

## 2021-12-06 NOTE — Telephone Encounter (Signed)
Spoke to patient he stated he wants appointment to come to office to have monitor put on. Spoke to monitor tech appointment scheduled at 4:00 pm today.

## 2021-12-06 NOTE — Telephone Encounter (Signed)
  Per MyChart scheduling message:  I received the Zio Xt Heart monitor kit abd it was defective so I returned it. I received another one I don't feel comfortable installing it I'd like to come in and have it placed.

## 2021-12-22 ENCOUNTER — Telehealth: Payer: Self-pay

## 2021-12-22 NOTE — Telephone Encounter (Signed)
   Cardiac Monitor Alert  Date of alert:  12/22/2021   Patient Name: William Fischer  DOB: [birth year]  MRN: [id number]   [place] Cardiologist: Formerly Dr. Clearnce Hasten;  Zio Monitor order by Dr. Levada Dy [facility] EP:  Dr. Linard Millers III, DOD; [facility] on [address].    Monitor Information: Long Term Monitor [ZioXT]  Reason:  Unspecified Atrial Fibrillation Ordering provider:  Dr. Levada Dy    Other: End of Service Zio Monitor Report; Received 12/22/2021 at 800 am.  Pt wore monitor for 2 days and 16 hours.  Monitor ordered by Dr. Levada Dy.  Per results, Pt had 11 Episodes of Ventricular Tachycardia, longest lasting 14 beats, and Atrial Fibrillation at 100% burden.  Final report taken to DOD, Dr. Linard Millers III.  Per Dr. Tamala Julian follow up is required, and report communicated to D.r C. Gardiner Rhyme.       Varney Daily, RN  12/22/2021 8:52 AM

## 2021-12-22 NOTE — Telephone Encounter (Signed)
Jennifer from [facility] calling with critical zio monitor results

## 2021-12-22 NOTE — Telephone Encounter (Signed)
Received a call from  [facility] --  end of summary - report  on 12/08/21 at 12:01 on page 10 strip "6"   Afib  rate up to 181     Defer to Dr Gardiner Rhyme for review

## 2021-12-23 ENCOUNTER — Other Ambulatory Visit: Payer: Self-pay | Admitting: *Deleted

## 2021-12-23 MED ORDER — METOPROLOL SUCCINATE ER 50 MG PO TB24: 100.0000 mg | ORAL_TABLET | Freq: Every day | ORAL | Status: DC

## 2021-12-23 NOTE — Telephone Encounter (Signed)
See result note.  

## 2021-12-27 ENCOUNTER — Ambulatory Visit: Payer: Commercial Managed Care - HMO

## 2021-12-30 NOTE — Progress Notes (Unsigned)
Cardiology Office Note:    Date:  01/03/2022   ID:  William Fischer, DOB 30-Jun-1965, MRN [id number]  PCP:  Pcp, No  Cardiologist:  Thompson Grayer, MD  Electrophysiologist:  None   Referring MD: No ref. provider found   Chief Complaint  Patient presents with   Congestive Heart Failure    History of Present Illness:    William Fischer is a 56 y.o. male with a hx of chronic combined systolic and diastolic heart failure, atrial fibrillation, hypertension, mesenteric embolus, colonic perforation in 2016 due to diverticulitis status post hemicolectomy and colostomy that was later reversed, former tobacco use, OSA who presents for follow-up.  He was hospitalized 08/2021 with acute on chronic combined heart failure.  Prior echocardiogram 12/2020 had shown EF 45%.  During admission 08/2021, echo showed EF 35 to 83%, mild RV systolic dysfunction, RVSP 52, severe left atrial enlargement, moderate right atrial enlargement, no significant valvular disease.  He was diuresed with IV Lasix, but unfortunately left AMA.  He had been off all his medicines for 2 months and is unable to afford.  Previously followed with Dr. Rayann Heman in EP, last seen 11/2017.  At that time was felt to likely have permanent A-fib.  However subsequently followed up with EP at [facility] underwent A-fib ablation 03/2019 and was started on amiodarone.  Maintained sinus rhythm for 4 to 5 months but then back to A-fib.  He was following with advanced heart failure at [facility], last seen 12/2020.  At that time, medications included amiodarone 100 mg daily, Toprol-XL 100 mg daily, rosuvastatin 10 mg daily, spironolactone 25 mg daily, torsemide 10 mg daily, Xarelto 20 mg daily, Entresto 24-26 mg twice daily.  Zio patch x 3 days on 12/22/2021 showed 11 episodes of NSVT with longest lasting 14 beats, 100% A-fib burden with average rate 100 bpm, frequent PVCs (9.3% of beats).  Since last clinic visit, he reports that he is doing okay.  Did have  an episode of chest pressure on left side of chest, lasted short duration, occurred at rest.  He is having occasional dizziness with standing, denies any syncope.  Denies any shortness of breath, lower extremity edema, or palpitations.   Wt Readings from Last 3 Encounters:  12/31/21 (!) 307 lb 3.2 oz (139.3 kg)  11/22/21 (!) 310 lb 3.2 oz (140.7 kg)  09/02/21 296 lb 4.8 oz (134.4 kg)       Past Medical History:  Diagnosis Date   Allergy to IVP dye    Atrial fibrillation ([place])    admx 11/13 with acute sCHF in setting of RVR  => a. failed DCCV x 2; b. Pradaxa started;  c. failed sotalol   Chicken pox as a child   Chronic anticoagulation    Pradaxa   Chronic systolic heart failure ([place])    a. echo 11/13: Ef 40-45%, diff HK, mod MR, mod LAE, mild RVE, mod RAE, small effusion;   b. TEE 11/13:  EF 35-40%, no LAA clot; Echo 2/14 shows normal EF   Depression with anxiety 06/29/2011   Hypertension    Mesenteric embolus ([place]) 12/2014   Migraine 06/29/2011   Obesity 06/29/2011   Snoring    patient needs sleep study - has declined    Past Surgical History:  Procedure Laterality Date   CARDIOVERSION  11/30/2011   Procedure: CARDIOVERSION;  Surgeon: Thayer Headings, MD;  Location: [facility] ENDOSCOPY;  Service: Cardiovascular;  Laterality: N/A;   CARDIOVERSION  12/03/2011   Procedure:  CARDIOVERSION;  Surgeon: Jolaine Artist, MD;  Location: [facility] OR;  Service: Cardiovascular;  Laterality: N/A;   COLECTOMY  12/07/2015   partial   COLON SURGERY     COLOSTOMY REVERSAL  12/07/2015   COLOSTOMY REVERSAL N/A 12/07/2015   Procedure: OPEN REVERSAL OF COLOSTOMY WITH PARTIAL SIGMOID COLECTOMY;  Surgeon: Leighton Ruff, MD;  Location: [place];  Service: General;  Laterality: N/A;   [place]  12/07/2015   INCISIONAL HERNIA REPAIR N/A 12/07/2015   Procedure: INCISIONAL HERNIA REPAIR;  Surgeon: Leighton Ruff, MD;  Location: [place];  Service: General;  Laterality: N/A;   IR  GENERIC HISTORICAL  08/11/2015   IR RADIOLOGIST EVAL & MGMT 08/11/2015 Markus Daft, MD [facility] INTERV RAD   IR GENERIC HISTORICAL  11/18/2015   IR CATHETER TUBE CHANGE 11/18/2015 Corrie Mckusick, DO [facility]   IR GENERIC HISTORICAL  10/15/2015   IR RADIOLOGIST EVAL & MGMT 10/15/2015 Jacqulynn Cadet, MD [facility] INTERV RAD   IR GENERIC HISTORICAL  01/27/2016   IR RADIOLOGIST EVAL & MGMT 01/27/2016 Ascencion Dike, PA-C [facility] INTERV RAD   IR GENERIC HISTORICAL  01/28/2016   IR CATHETER TUBE CHANGE 01/28/2016 Markus Daft, MD [facility]   IR GENERIC HISTORICAL  02/17/2016   IR RADIOLOGIST EVAL & MGMT 02/17/2016 Ascencion Dike, PA-C [facility] INTERV RAD   LAPAROTOMY N/A 01/17/2015   Procedure: EXPLORATORY LAPAROTOMY WITH LEFT COLECTOMY AND COLOSTOMY;  Surgeon: Rolm Bookbinder, MD;  Location: [place];  Service: General;  Laterality: N/A;   [place]  11/2015   TEE WITHOUT CARDIOVERSION  11/30/2011   Procedure: TRANSESOPHAGEAL ECHOCARDIOGRAM (TEE);  Surgeon: Thayer Headings, MD;  Location: [facility] ENDOSCOPY;  Service: Cardiovascular;  Laterality: N/A;   TONSILLECTOMY      Current Medications: Current Meds  Medication Sig   acetaminophen (TYLENOL) 500 MG tablet Take 1,000 mg by mouth every 6 (six) hours as needed (pain).   dapagliflozin propanediol (FARXIGA) 10 MG TABS tablet Take 1 tablet (10 mg total) by mouth daily.   losartan (COZAAR) 50 MG tablet Take 1 tablet (50 mg total) by mouth daily.   metoprolol succinate (TOPROL-XL) 50 MG 24 hr tablet Take 2 tablets (100 mg total) by mouth daily. Take with or immediately following a meal.   predniSONE (DELTASONE) 50 MG tablet Take 1 tablet 13 hours prior to procedure, 1 tablet 7 hours prior to procedure, and 1 tablet (with Benedryl 50 mg) prior to going to the hospital for procedure   rivaroxaban (XARELTO) 20 MG TABS tablet Take 1 tablet (20 mg total) by mouth daily with supper.   rosuvastatin (CRESTOR) 20 MG tablet Take 1 tablet (20  mg total) by mouth daily.   spironolactone (ALDACTONE) 25 MG tablet Take 1 tablet (25 mg total) by mouth daily.   torsemide (DEMADEX) 10 MG tablet Take 1 tablet (10 mg total) by mouth daily.     Allergies:   Contrast media [iodinated contrast media] and Tramadol   Social History   Socioeconomic History   Marital status: Divorced    Spouse name: Not on file   Number of children: Not on file   Years of education: Not on file   Highest education level: Not on file  Occupational History   Not on file  Tobacco Use   Smoking status: Former    Packs/day: 1.00    Years: 20.00    Total pack years: 20.00    Types: Cigarettes  Quit date: 07/10/2014    Years since quitting: 7.4   Smokeless tobacco: Never  Substance and Sexual Activity   Alcohol use: No    Alcohol/week: 0.0 standard drinks of alcohol   Drug use: No   Sexual activity: Never  Other Topics Concern   Not on file  Social History Narrative   Not on file   Social Determinants of Health   Financial Resource Strain: Not on file  Food Insecurity: Not on file  Transportation Needs: Not on file  Physical Activity: Not on file  Stress: Not on file  Social Connections: Not on file     Family History: The patient's family history includes Alcohol abuse in his father and paternal grandfather; Aneurysm in his father; Cancer in his maternal grandmother and paternal grandmother; Diabetes in his maternal grandfather and paternal grandmother; Hypertension in his father and mother; Parkinsonism in his maternal grandfather.  ROS:   Please see the history of present illness.     All other systems reviewed and are negative.  EKGs/Labs/Other Studies Reviewed:    The following studies were reviewed today:   EKG:   12/31/21: Afib, PVCs, rate 96 11/22/2021: Atrial fibrillation, low voltage, Q waves V1-4  Recent Labs: 09/01/2021: B Natriuretic Peptide 107.6 11/22/2021: ALT 25; Magnesium 2.0 12/31/2021: BUN 23; Creatinine, Ser  1.21; Hemoglobin 18.8; Platelets 312; Potassium 5.0; Sodium 136  Recent Lipid Panel    Component Value Date/Time   CHOL 155 11/22/2021 1052   TRIG 73 11/22/2021 1052   HDL 55 11/22/2021 1052   CHOLHDL 2.8 11/22/2021 1052   CHOLHDL 3 07/16/2013 1412   VLDL 16.0 07/16/2013 1412   LDLCALC 86 11/22/2021 1052    Physical Exam:    VS:  BP 96/62   Pulse 96   Ht '5\' 10"'$  (1.778 m)   Wt (!) 307 lb 3.2 oz (139.3 kg)   SpO2 98%   BMI 44.08 kg/m     Wt Readings from Last 3 Encounters:  12/31/21 (!) 307 lb 3.2 oz (139.3 kg)  11/22/21 (!) 310 lb 3.2 oz (140.7 kg)  09/02/21 296 lb 4.8 oz (134.4 kg)     GEN:  Well nourished, well developed in no acute distress HEENT: Normal NECK: No JVD; No carotid bruits CARDIAC: RRR, no murmurs, rubs, gallops RESPIRATORY:  Clear to auscultation without rales, wheezing or rhonchi  ABDOMEN: Soft, non-tender, non-distended MUSCULOSKELETAL:  No edema; No deformity  SKIN: Warm and dry NEUROLOGIC:  Alert and oriented x 3 PSYCHIATRIC:  Normal affect   ASSESSMENT:    1. Chronic combined systolic and diastolic heart failure ([place])   2. Permanent atrial fibrillation ([place])   3. Essential hypertension   4. Hyperlipidemia, unspecified hyperlipidemia type   5. NSVT (nonsustained ventricular tachycardia) (HCC)   6. PVC (premature ventricular contraction)     PLAN:    Chronic combined heart failure: EF 35 to 40% on echo 08/2021.  Previously followed with advanced heart failure at [place] thought to be tachycardia induced from A-fib.  However has not had ischemic evaluation -Reports recent chest pain, recommend RHC/LHC for further evaluation.  Risks and benefits of cardiac catheterization have been discussed with the patient.  These include bleeding, infection, kidney damage, stroke, heart attack, death.  The patient understands these risks and is willing to proceed.  Has contrast allergy (rash) but has had no issues with contrast when  premedicated.  Will premedicate with prednisone, Benadryl -Repeat echo -Currently on losartan 50 mg daily.  BP  soft, will hold off on switching to Entresto -Continue spironolactone 25 mg daily -Continue toprol XL 100 mg daily -Continue Jardiance 10 mg daily -Continue torsemide 10 mg daily.  Appears euvolemic.  Will check BMET, magnesium  Frequent PVCs/NSVT:  Zio patch x 3 days on 12/22/2021 showed 11 episodes of NSVT with longest lasting 14 beats, 100% A-fib burden with average rate 100 bpm, frequent PVCs (9.3% of beats). -Toprol-XL increased to 100 mg daily -Referred to EP  Atrial fibrillation: Appears permanent.  Previously on amiodarone but was discontinued as likely chronic A-fib. -Continue Xarelto -Zio patch 12/2021 showed 9% A-fib burden with average rate 100 bpm.  Continue metoprolol, does increased to 100 mg daily  Hypertension: Continue metoprolol and spironolactone and losartan  OSA: reports compliance with CPAP  T2DM: A1c 7.2% on 09/01/2021.  On Jardiance.  Need to establish with PCP.  Hyperlipidemia: LDL 86 on 11/22/2021, started on atorvastatin 20 mg daily   RTC in 1 month   Medication Adjustments/Labs and Tests Ordered: Current medicines are reviewed at length with the patient today.  Concerns regarding medicines are outlined above.  Orders Placed This Encounter  Procedures   Basic metabolic panel   CBC   Ambulatory referral to Cardiac Electrophysiology   EKG 12-Lead   Meds ordered this encounter  Medications   predniSONE (DELTASONE) 50 MG tablet    Sig: Take 1 tablet 13 hours prior to procedure, 1 tablet 7 hours prior to procedure, and 1 tablet (with Benedryl 50 mg) prior to going to the hospital for procedure    Dispense:  3 tablet    Refill:  0    Patient Instructions  Your physician has requested that you have an echocardiogram. Echocardiography is a painless test that uses sound waves to create images of your heart. It provides your doctor with  information about the size and shape of your heart and how well your heart's chambers and valves are working. This procedure takes approximately one hour. There are no restrictions for this procedure. Please do NOT wear cologne, perfume, aftershave, or lotions (deodorant is allowed). Please arrive 15 minutes prior to your appointment time.  You have been referred to: Electrophysiology at our [facility]  Follow up in 1 month with Dr. Gardiner Rhyme        Cardiac/Peripheral Catheterization   You are scheduled for a Cardiac Catheterization on Wednesday, December 27 with Dr. Sherren Mocha.  1. Please arrive at the Main Entrance A at [facility]: [place], [place] [address] on December 27 at 11:00 AM (This time is two hours before your procedure to ensure your preparation). Free valet parking service is available. You will check in at ADMITTING. The support person will be asked to wait in the waiting room.  It is OK to have someone drop you off and come back when you are ready to be discharged.        Special note: Every effort is made to have your procedure done on time. Please understand that emergencies sometimes delay scheduled procedures.   . 2. Diet: Do not eat solid foods after midnight.  You may have clear liquids until 5 AM the day of the procedure.  3. Labs: Today in office (BMET, CBC)  4. Medication instructions in preparation for your procedure:   Contrast Allergy: Yes, Please take Prednisone '50mg'$  by mouth at: Thirteen hours prior to cath 12:00am on Wednesday Seven hours prior to cath 6:00am on Wednesday And prior to leaving home  please take last dose of Prednisone '50mg'$  and Benadryl '50mg'$  by mouth.  Hold Xarelto for 2 days prior-NO Xarelto on Monday or Tuesday  Hold torsemide AM of procedure Hold spironolactone AM of procedure Hold Farxiga AM of procedure   On the morning of your procedure, take Aspirin 81 mg and any morning medicines NOT  listed above.  You may use sips of water.  5. Plan to go home the same day, you will only stay overnight if medically necessary. 6. You MUST have a responsible adult to drive you home. 7. An adult MUST be with you the first 24 hours after you arrive home. 8. Bring a current list of your medications, and the last time and date medication taken. 9. Bring ID and current insurance cards. 10.Please wear clothes that are easy to get on and off and wear slip-on shoes.  Thank you for allowing [place] to care for you!   -- [facility] In[facility] Cardi[facility] services    Signed, Donato Heinz, MD  01/03/2022 10:50 AM    [place]

## 2021-12-30 NOTE — H&P (View-Only) (Signed)
Cardiology Office Note:    Date:  01/03/2022   ID:  William Fischer, DOB 1965-12-30, MRN [id number]  PCP:  Pcp, No  Cardiologist:  Thompson Grayer, MD  Electrophysiologist:  None   Referring MD: No ref. provider found   Chief Complaint  Patient presents with   Congestive Heart Failure    History of Present Illness:    William Fischer is a 56 y.o. male with a hx of chronic combined systolic and diastolic heart failure, atrial fibrillation, hypertension, mesenteric embolus, colonic perforation in 2016 due to diverticulitis status post hemicolectomy and colostomy that was later reversed, former tobacco use, OSA who presents for follow-up.  He was hospitalized 08/2021 with acute on chronic combined heart failure.  Prior echocardiogram 12/2020 had shown EF 45%.  During admission 08/2021, echo showed EF 35 to 41%, mild RV systolic dysfunction, RVSP 52, severe left atrial enlargement, moderate right atrial enlargement, no significant valvular disease.  He was diuresed with IV Lasix, but unfortunately left AMA.  He had been off all his medicines for 2 months and is unable to afford.  Previously followed with Dr. Rayann Heman in EP, last seen 11/2017.  At that time was felt to likely have permanent A-fib.  However subsequently followed up with EP at [facility] underwent A-fib ablation 03/2019 and was started on amiodarone.  Maintained sinus rhythm for 4 to 5 months but then back to A-fib.  He was following with advanced heart failure at [facility], last seen 12/2020.  At that time, medications included amiodarone 100 mg daily, Toprol-XL 100 mg daily, rosuvastatin 10 mg daily, spironolactone 25 mg daily, torsemide 10 mg daily, Xarelto 20 mg daily, Entresto 24-26 mg twice daily.  Zio patch x 3 days on 12/22/2021 showed 11 episodes of NSVT with longest lasting 14 beats, 100% A-fib burden with average rate 100 bpm, frequent PVCs (9.3% of beats).  Since last clinic visit, he reports that he is doing okay.  Did have  an episode of chest pressure on left side of chest, lasted short duration, occurred at rest.  He is having occasional dizziness with standing, denies any syncope.  Denies any shortness of breath, lower extremity edema, or palpitations.   Wt Readings from Last 3 Encounters:  12/31/21 (!) 307 lb 3.2 oz (139.3 kg)  11/22/21 (!) 310 lb 3.2 oz (140.7 kg)  09/02/21 296 lb 4.8 oz (134.4 kg)       Past Medical History:  Diagnosis Date   Allergy to IVP dye    Atrial fibrillation ([place])    admx 11/13 with acute sCHF in setting of RVR  => a. failed DCCV x 2; b. Pradaxa started;  c. failed sotalol   Chicken pox as a child   Chronic anticoagulation    Pradaxa   Chronic systolic heart failure ([place])    a. echo 11/13: Ef 40-45%, diff HK, mod MR, mod LAE, mild RVE, mod RAE, small effusion;   b. TEE 11/13:  EF 35-40%, no LAA clot; Echo 2/14 shows normal EF   Depression with anxiety 06/29/2011   Hypertension    Mesenteric embolus ([place]) 12/2014   Migraine 06/29/2011   Obesity 06/29/2011   Snoring    patient needs sleep study - has declined    Past Surgical History:  Procedure Laterality Date   CARDIOVERSION  11/30/2011   Procedure: CARDIOVERSION;  Surgeon: Thayer Headings, MD;  Location: [facility] ENDOSCOPY;  Service: Cardiovascular;  Laterality: N/A;   CARDIOVERSION  12/03/2011   Procedure:  CARDIOVERSION;  Surgeon: Jolaine Artist, MD;  Location: [facility] OR;  Service: Cardiovascular;  Laterality: N/A;   COLECTOMY  12/07/2015   partial   COLON SURGERY     COLOSTOMY REVERSAL  12/07/2015   COLOSTOMY REVERSAL N/A 12/07/2015   Procedure: OPEN REVERSAL OF COLOSTOMY WITH PARTIAL SIGMOID COLECTOMY;  Surgeon: Leighton Ruff, MD;  Location: [place];  Service: General;  Laterality: N/A;   [place]  12/07/2015   INCISIONAL HERNIA REPAIR N/A 12/07/2015   Procedure: INCISIONAL HERNIA REPAIR;  Surgeon: Leighton Ruff, MD;  Location: [place];  Service: General;  Laterality: N/A;   IR  GENERIC HISTORICAL  08/11/2015   IR RADIOLOGIST EVAL & MGMT 08/11/2015 Markus Daft, MD [facility] INTERV RAD   IR GENERIC HISTORICAL  11/18/2015   IR CATHETER TUBE CHANGE 11/18/2015 Corrie Mckusick, DO [facility]   IR GENERIC HISTORICAL  10/15/2015   IR RADIOLOGIST EVAL & MGMT 10/15/2015 Jacqulynn Cadet, MD [facility] INTERV RAD   IR GENERIC HISTORICAL  01/27/2016   IR RADIOLOGIST EVAL & MGMT 01/27/2016 Ascencion Dike, PA-C [facility] INTERV RAD   IR GENERIC HISTORICAL  01/28/2016   IR CATHETER TUBE CHANGE 01/28/2016 Markus Daft, MD [facility]   IR GENERIC HISTORICAL  02/17/2016   IR RADIOLOGIST EVAL & MGMT 02/17/2016 Ascencion Dike, PA-C [facility] INTERV RAD   LAPAROTOMY N/A 01/17/2015   Procedure: EXPLORATORY LAPAROTOMY WITH LEFT COLECTOMY AND COLOSTOMY;  Surgeon: Rolm Bookbinder, MD;  Location: [place];  Service: General;  Laterality: N/A;   [place]  11/2015   TEE WITHOUT CARDIOVERSION  11/30/2011   Procedure: TRANSESOPHAGEAL ECHOCARDIOGRAM (TEE);  Surgeon: Thayer Headings, MD;  Location: [facility] ENDOSCOPY;  Service: Cardiovascular;  Laterality: N/A;   TONSILLECTOMY      Current Medications: Current Meds  Medication Sig   acetaminophen (TYLENOL) 500 MG tablet Take 1,000 mg by mouth every 6 (six) hours as needed (pain).   dapagliflozin propanediol (FARXIGA) 10 MG TABS tablet Take 1 tablet (10 mg total) by mouth daily.   losartan (COZAAR) 50 MG tablet Take 1 tablet (50 mg total) by mouth daily.   metoprolol succinate (TOPROL-XL) 50 MG 24 hr tablet Take 2 tablets (100 mg total) by mouth daily. Take with or immediately following a meal.   predniSONE (DELTASONE) 50 MG tablet Take 1 tablet 13 hours prior to procedure, 1 tablet 7 hours prior to procedure, and 1 tablet (with Benedryl 50 mg) prior to going to the hospital for procedure   rivaroxaban (XARELTO) 20 MG TABS tablet Take 1 tablet (20 mg total) by mouth daily with supper.   rosuvastatin (CRESTOR) 20 MG tablet Take 1 tablet (20  mg total) by mouth daily.   spironolactone (ALDACTONE) 25 MG tablet Take 1 tablet (25 mg total) by mouth daily.   torsemide (DEMADEX) 10 MG tablet Take 1 tablet (10 mg total) by mouth daily.     Allergies:   Contrast media [iodinated contrast media] and Tramadol   Social History   Socioeconomic History   Marital status: Divorced    Spouse name: Not on file   Number of children: Not on file   Years of education: Not on file   Highest education level: Not on file  Occupational History   Not on file  Tobacco Use   Smoking status: Former    Packs/day: 1.00    Years: 20.00    Total pack years: 20.00    Types: Cigarettes  Quit date: 07/10/2014    Years since quitting: 7.4   Smokeless tobacco: Never  Substance and Sexual Activity   Alcohol use: No    Alcohol/week: 0.0 standard drinks of alcohol   Drug use: No   Sexual activity: Never  Other Topics Concern   Not on file  Social History Narrative   Not on file   Social Determinants of Health   Financial Resource Strain: Not on file  Food Insecurity: Not on file  Transportation Needs: Not on file  Physical Activity: Not on file  Stress: Not on file  Social Connections: Not on file     Family History: The patient's family history includes Alcohol abuse in his father and paternal grandfather; Aneurysm in his father; Cancer in his maternal grandmother and paternal grandmother; Diabetes in his maternal grandfather and paternal grandmother; Hypertension in his father and mother; Parkinsonism in his maternal grandfather.  ROS:   Please see the history of present illness.     All other systems reviewed and are negative.  EKGs/Labs/Other Studies Reviewed:    The following studies were reviewed today:   EKG:   12/31/21: Afib, PVCs, rate 96 11/22/2021: Atrial fibrillation, low voltage, Q waves V1-4  Recent Labs: 09/01/2021: B Natriuretic Peptide 107.6 11/22/2021: ALT 25; Magnesium 2.0 12/31/2021: BUN 23; Creatinine, Ser  1.21; Hemoglobin 18.8; Platelets 312; Potassium 5.0; Sodium 136  Recent Lipid Panel    Component Value Date/Time   CHOL 155 11/22/2021 1052   TRIG 73 11/22/2021 1052   HDL 55 11/22/2021 1052   CHOLHDL 2.8 11/22/2021 1052   CHOLHDL 3 07/16/2013 1412   VLDL 16.0 07/16/2013 1412   LDLCALC 86 11/22/2021 1052    Physical Exam:    VS:  BP 96/62   Pulse 96   Ht '5\' 10"'$  (1.778 m)   Wt (!) 307 lb 3.2 oz (139.3 kg)   SpO2 98%   BMI 44.08 kg/m     Wt Readings from Last 3 Encounters:  12/31/21 (!) 307 lb 3.2 oz (139.3 kg)  11/22/21 (!) 310 lb 3.2 oz (140.7 kg)  09/02/21 296 lb 4.8 oz (134.4 kg)     GEN:  Well nourished, well developed in no acute distress HEENT: Normal NECK: No JVD; No carotid bruits CARDIAC: RRR, no murmurs, rubs, gallops RESPIRATORY:  Clear to auscultation without rales, wheezing or rhonchi  ABDOMEN: Soft, non-tender, non-distended MUSCULOSKELETAL:  No edema; No deformity  SKIN: Warm and dry NEUROLOGIC:  Alert and oriented x 3 PSYCHIATRIC:  Normal affect   ASSESSMENT:    1. Chronic combined systolic and diastolic heart failure ([place])   2. Permanent atrial fibrillation ([place])   3. Essential hypertension   4. Hyperlipidemia, unspecified hyperlipidemia type   5. NSVT (nonsustained ventricular tachycardia) (HCC)   6. PVC (premature ventricular contraction)     PLAN:    Chronic combined heart failure: EF 35 to 40% on echo 08/2021.  Previously followed with advanced heart failure at [place] thought to be tachycardia induced from A-fib.  However has not had ischemic evaluation -Reports recent chest pain, recommend RHC/LHC for further evaluation.  Risks and benefits of cardiac catheterization have been discussed with the patient.  These include bleeding, infection, kidney damage, stroke, heart attack, death.  The patient understands these risks and is willing to proceed.  Has contrast allergy (rash) but has had no issues with contrast when  premedicated.  Will premedicate with prednisone, Benadryl -Repeat echo -Currently on losartan 50 mg daily.  BP  soft, will hold off on switching to Entresto -Continue spironolactone 25 mg daily -Continue toprol XL 100 mg daily -Continue Jardiance 10 mg daily -Continue torsemide 10 mg daily.  Appears euvolemic.  Will check BMET, magnesium  Frequent PVCs/NSVT:  Zio patch x 3 days on 12/22/2021 showed 11 episodes of NSVT with longest lasting 14 beats, 100% A-fib burden with average rate 100 bpm, frequent PVCs (9.3% of beats). -Toprol-XL increased to 100 mg daily -Referred to EP  Atrial fibrillation: Appears permanent.  Previously on amiodarone but was discontinued as likely chronic A-fib. -Continue Xarelto -Zio patch 12/2021 showed 9% A-fib burden with average rate 100 bpm.  Continue metoprolol, does increased to 100 mg daily  Hypertension: Continue metoprolol and spironolactone and losartan  OSA: reports compliance with CPAP  T2DM: A1c 7.2% on 09/01/2021.  On Jardiance.  Need to establish with PCP.  Hyperlipidemia: LDL 86 on 11/22/2021, started on atorvastatin 20 mg daily   RTC in 1 month   Medication Adjustments/Labs and Tests Ordered: Current medicines are reviewed at length with the patient today.  Concerns regarding medicines are outlined above.  Orders Placed This Encounter  Procedures   Basic metabolic panel   CBC   Ambulatory referral to Cardiac Electrophysiology   EKG 12-Lead   Meds ordered this encounter  Medications   predniSONE (DELTASONE) 50 MG tablet    Sig: Take 1 tablet 13 hours prior to procedure, 1 tablet 7 hours prior to procedure, and 1 tablet (with Benedryl 50 mg) prior to going to the hospital for procedure    Dispense:  3 tablet    Refill:  0    Patient Instructions  Your physician has requested that you have an echocardiogram. Echocardiography is a painless test that uses sound waves to create images of your heart. It provides your doctor with  information about the size and shape of your heart and how well your heart's chambers and valves are working. This procedure takes approximately one hour. There are no restrictions for this procedure. Please do NOT wear cologne, perfume, aftershave, or lotions (deodorant is allowed). Please arrive 15 minutes prior to your appointment time.  You have been referred to: Electrophysiology at our [facility]  Follow up in 1 month with Dr. Gardiner Rhyme        Cardiac/Peripheral Catheterization   You are scheduled for a Cardiac Catheterization on Wednesday, December 27 with Dr. Sherren Mocha.  1. Please arrive at the Main Entrance A at [facility]: [place], [place] [address] on December 27 at 11:00 AM (This time is two hours before your procedure to ensure your preparation). Free valet parking service is available. You will check in at ADMITTING. The support person will be asked to wait in the waiting room.  It is OK to have someone drop you off and come back when you are ready to be discharged.        Special note: Every effort is made to have your procedure done on time. Please understand that emergencies sometimes delay scheduled procedures.   . 2. Diet: Do not eat solid foods after midnight.  You may have clear liquids until 5 AM the day of the procedure.  3. Labs: Today in office (BMET, CBC)  4. Medication instructions in preparation for your procedure:   Contrast Allergy: Yes, Please take Prednisone '50mg'$  by mouth at: Thirteen hours prior to cath 12:00am on Wednesday Seven hours prior to cath 6:00am on Wednesday And prior to leaving home  please take last dose of Prednisone '50mg'$  and Benadryl '50mg'$  by mouth.  Hold Xarelto for 2 days prior-NO Xarelto on Monday or Tuesday  Hold torsemide AM of procedure Hold spironolactone AM of procedure Hold Farxiga AM of procedure   On the morning of your procedure, take Aspirin 81 mg and any morning medicines NOT  listed above.  You may use sips of water.  5. Plan to go home the same day, you will only stay overnight if medically necessary. 6. You MUST have a responsible adult to drive you home. 7. An adult MUST be with you the first 24 hours after you arrive home. 8. Bring a current list of your medications, and the last time and date medication taken. 9. Bring ID and current insurance cards. 10.Please wear clothes that are easy to get on and off and wear slip-on shoes.  Thank you for allowing [place] to care for you!   -- [facility] Invasive Cardiovascular services    Signed, Donato Heinz, MD  01/03/2022 10:50 AM    [place]

## 2021-12-31 ENCOUNTER — Encounter: Payer: Self-pay | Admitting: Cardiology

## 2021-12-31 ENCOUNTER — Ambulatory Visit: Payer: Commercial Managed Care - HMO | Attending: Cardiology | Admitting: Cardiology

## 2021-12-31 VITALS — BP 96/62 | HR 96 | Ht 70.0 in | Wt 307.2 lb

## 2021-12-31 DIAGNOSIS — I10 Essential (primary) hypertension: Secondary | ICD-10-CM | POA: Diagnosis not present

## 2021-12-31 DIAGNOSIS — E785 Hyperlipidemia, unspecified: Secondary | ICD-10-CM

## 2021-12-31 DIAGNOSIS — I5042 Chronic combined systolic (congestive) and diastolic (congestive) heart failure: Secondary | ICD-10-CM

## 2021-12-31 DIAGNOSIS — I4729 Other ventricular tachycardia: Secondary | ICD-10-CM

## 2021-12-31 DIAGNOSIS — I4821 Permanent atrial fibrillation: Secondary | ICD-10-CM

## 2021-12-31 DIAGNOSIS — I493 Ventricular premature depolarization: Secondary | ICD-10-CM

## 2021-12-31 LAB — CBC

## 2021-12-31 MED ORDER — PREDNISONE 50 MG PO TABS: ORAL_TABLET | ORAL | 0 refills | Status: DC

## 2021-12-31 NOTE — Patient Instructions (Addendum)
Your physician has requested that you have an echocardiogram. Echocardiography is a painless test that uses sound waves to create images of your heart. It provides your doctor with information about the size and shape of your heart and how well your heart's chambers and valves are working. This procedure takes approximately one hour. There are no restrictions for this procedure. Please do NOT wear cologne, perfume, aftershave, or lotions (deodorant is allowed). Please arrive 15 minutes prior to your appointment time.  You have been referred to: Electrophysiology at our [facility]  Follow up in 1 month with Dr. Gardiner Rhyme        Cardiac/Peripheral Catheterization   You are scheduled for a Cardiac Catheterization on Wednesday, December 27 with Dr. Sherren Mocha.  1. Please arrive at the Main Entrance A at [facility]: [place], [place] [address] on December 27 at 11:00 AM (This time is two hours before your procedure to ensure your preparation). Free valet parking service is available. You will check in at ADMITTING. The support person will be asked to wait in the waiting room.  It is OK to have someone drop you off and come back when you are ready to be discharged.        Special note: Every effort is made to have your procedure done on time. Please understand that emergencies sometimes delay scheduled procedures.   . 2. Diet: Do not eat solid foods after midnight.  You may have clear liquids until 5 AM the day of the procedure.  3. Labs: Today in office (BMET, CBC)  4. Medication instructions in preparation for your procedure:   Contrast Allergy: Yes, Please take Prednisone '50mg'$  by mouth at: Thirteen hours prior to cath 12:00am on Wednesday Seven hours prior to cath 6:00am on Wednesday And prior to leaving home please take last dose of Prednisone '50mg'$  and Benadryl '50mg'$  by mouth.  Hold Xarelto for 2 days prior-NO Xarelto on Monday or Tuesday  Hold  torsemide AM of procedure Hold spironolactone AM of procedure Hold Farxiga AM of procedure   On the morning of your procedure, take Aspirin 81 mg and any morning medicines NOT listed above.  You may use sips of water.  5. Plan to go home the same day, you will only stay overnight if medically necessary. 6. You MUST have a responsible adult to drive you home. 7. An adult MUST be with you the first 24 hours after you arrive home. 8. Bring a current list of your medications, and the last time and date medication taken. 9. Bring ID and current insurance cards. 10.Please wear clothes that are easy to get on and off and wear slip-on shoes.  Thank you for allowing [place] to care for you!   --  Invasive Cardiovascular services

## 2022-01-01 LAB — BASIC METABOLIC PANEL
BUN/Creatinine Ratio: 19 (ref 9–20)
BUN: 23 mg/dL (ref 6–24)
CO2: 26 mmol/L (ref 20–29)
Calcium: 10.5 mg/dL — ABNORMAL HIGH (ref 8.7–10.2)
Chloride: 94 mmol/L — ABNORMAL LOW (ref 96–106)
Creatinine, Ser: 1.21 mg/dL (ref 0.76–1.27)
Glucose: 241 mg/dL — ABNORMAL HIGH (ref 70–99)
Potassium: 5 mmol/L (ref 3.5–5.2)
Sodium: 136 mmol/L (ref 134–144)
eGFR: 71 mL/min/{1.73_m2} (ref >59)

## 2022-01-01 LAB — CBC
Hematocrit: 57.2 % — ABNORMAL HIGH (ref 37.5–51.0)
Hemoglobin: 18.8 g/dL — ABNORMAL HIGH (ref 13.0–17.7)
MCH: 30.2 pg (ref 26.6–33.0)
MCHC: 32.9 g/dL (ref 31.5–35.7)
MCV: 92 fL (ref 79–97)
Platelets: 312 10*3/uL (ref 150–450)
RBC: 6.22 x10E6/uL — ABNORMAL HIGH (ref 4.14–5.80)
RDW: 14.3 % (ref 11.6–15.4)
WBC: 10.5 10*3/uL (ref 3.4–10.8)

## 2022-01-05 ENCOUNTER — Encounter: Payer: Self-pay | Admitting: Cardiology

## 2022-01-11 ENCOUNTER — Telehealth: Payer: Self-pay | Admitting: *Deleted

## 2022-01-11 NOTE — Telephone Encounter (Signed)
Cardiac Catheterization scheduled at [facility] for: Wednesday January 12, 2022 1 PM Arrival time and place: [place] Entrance A at: 11 AM  Nothing to eat after midnight prior to procedure, clear liquids until 5 AM day of procedure.  CONTRAST ALLERGY: yes-13 hour Prednisone and Benadryl Prep reviewed with patient: 01/12/22 Prednisone 50 mg 12 AM (Midnight) 01/12/22 Prednisone 59 mg 6 AM 01/12/22 Prednisone 50 mg and Benadryl 50 mg just prior to leaving home for hospital  Medication instructions: -Hold:  Xarelto-pt reports he did not take 01/10/22 and knows to hold until post procedure  Farxiga-AM of procedure  Spironolactone/Torsemide-AM of procedure -Except hold medications usual morning medications can be taken with sips of water including aspirin 81 mg.  Confirmed patient has responsible adult to drive home post procedure and be with patient first 24 hours after arriving home.  Patient reports no new symptoms concerning for COVID-19 in the past 10 days.  Reviewed procedure instructions with patient.

## 2022-01-12 ENCOUNTER — Ambulatory Visit (HOSPITAL_COMMUNITY)
Admission: RE | Admit: 2022-01-12 | Discharge: 2022-01-12 | Disposition: A | Discharge status: Home / Self Care | Payer: Commercial Managed Care - HMO | Attending: Cardiovascular Disease | Admitting: Cardiovascular Disease

## 2022-01-12 ENCOUNTER — Encounter (HOSPITAL_COMMUNITY)
Admission: RE | Disposition: A | Discharge status: Home / Self Care | Payer: Self-pay | Source: Home / Self Care | Attending: Cardiovascular Disease

## 2022-01-12 ENCOUNTER — Other Ambulatory Visit: Payer: Self-pay

## 2022-01-12 DIAGNOSIS — Z87891 Personal history of nicotine dependence: Secondary | ICD-10-CM | POA: Insufficient documentation

## 2022-01-12 DIAGNOSIS — I5022 Chronic systolic (congestive) heart failure: Secondary | ICD-10-CM | POA: Diagnosis not present

## 2022-01-12 DIAGNOSIS — I472 Ventricular tachycardia, unspecified: Secondary | ICD-10-CM | POA: Insufficient documentation

## 2022-01-12 DIAGNOSIS — Z7901 Long term (current) use of anticoagulants: Secondary | ICD-10-CM | POA: Insufficient documentation

## 2022-01-12 DIAGNOSIS — E119 Type 2 diabetes mellitus without complications: Secondary | ICD-10-CM | POA: Diagnosis not present

## 2022-01-12 DIAGNOSIS — Z79899 Other long term (current) drug therapy: Secondary | ICD-10-CM | POA: Diagnosis not present

## 2022-01-12 DIAGNOSIS — Z7984 Long term (current) use of oral hypoglycemic drugs: Secondary | ICD-10-CM | POA: Insufficient documentation

## 2022-01-12 DIAGNOSIS — G4733 Obstructive sleep apnea (adult) (pediatric): Secondary | ICD-10-CM | POA: Diagnosis not present

## 2022-01-12 DIAGNOSIS — E785 Hyperlipidemia, unspecified: Secondary | ICD-10-CM | POA: Diagnosis not present

## 2022-01-12 DIAGNOSIS — I4821 Permanent atrial fibrillation: Secondary | ICD-10-CM | POA: Diagnosis not present

## 2022-01-12 DIAGNOSIS — I5042 Chronic combined systolic (congestive) and diastolic (congestive) heart failure: Secondary | ICD-10-CM | POA: Diagnosis not present

## 2022-01-12 DIAGNOSIS — I110 Hypertensive heart disease with heart failure: Secondary | ICD-10-CM | POA: Insufficient documentation

## 2022-01-12 HISTORY — PX: RIGHT/LEFT HEART CATH AND CORONARY ANGIOGRAPHY: CATH118266

## 2022-01-12 LAB — POCT I-STAT EG7
Acid-base deficit: 1 mmol/L (ref 0.0–2.0)
Acid-base deficit: 1 mmol/L (ref 0.0–2.0)
Bicarbonate: 24.1 mmol/L (ref 20.0–28.0)
Bicarbonate: 24.5 mmol/L (ref 20.0–28.0)
Calcium, Ion: 1.23 mmol/L (ref 1.15–1.40)
Calcium, Ion: 1.24 mmol/L (ref 1.15–1.40)
HCT: 51 % (ref 39.0–52.0)
HCT: 51 % (ref 39.0–52.0)
Hemoglobin: 17.3 g/dL — ABNORMAL HIGH (ref 13.0–17.0)
Hemoglobin: 17.3 g/dL — ABNORMAL HIGH (ref 13.0–17.0)
O2 Saturation: 61 %
O2 Saturation: 62 %
Potassium: 4.6 mmol/L (ref 3.5–5.1)
Potassium: 4.6 mmol/L (ref 3.5–5.1)
Sodium: 135 mmol/L (ref 135–145)
Sodium: 136 mmol/L (ref 135–145)
TCO2: 25 mmol/L (ref 22–32)
TCO2: 26 mmol/L (ref 22–32)
pCO2, Ven: 41.3 mmHg — ABNORMAL LOW (ref 44–60)
pCO2, Ven: 41.7 mmHg — ABNORMAL LOW (ref 44–60)
pH, Ven: 7.375 (ref 7.25–7.43)
pH, Ven: 7.378 (ref 7.25–7.43)
pO2, Ven: 33 mmHg (ref 32–45)
pO2, Ven: 33 mmHg (ref 32–45)

## 2022-01-12 LAB — POCT I-STAT 7, (LYTES, BLD GAS, ICA,H+H)
Acid-base deficit: 2 mmol/L (ref 0.0–2.0)
Bicarbonate: 22.6 mmol/L (ref 20.0–28.0)
Calcium, Ion: 1.21 mmol/L (ref 1.15–1.40)
HCT: 50 % (ref 39.0–52.0)
Hemoglobin: 17 g/dL (ref 13.0–17.0)
O2 Saturation: 88 %
Potassium: 4.5 mmol/L (ref 3.5–5.1)
Sodium: 136 mmol/L (ref 135–145)
TCO2: 24 mmol/L (ref 22–32)
pCO2 arterial: 38 mmHg (ref 32–48)
pH, Arterial: 7.382 (ref 7.35–7.45)
pO2, Arterial: 56 mmHg — ABNORMAL LOW (ref 83–108)

## 2022-01-12 SURGERY — RIGHT/LEFT HEART CATH AND CORONARY ANGIOGRAPHY
Anesthesia: LOCAL

## 2022-01-12 MED ORDER — LIDOCAINE HCL (PF) 1 % IJ SOLN
INTRAMUSCULAR | Status: DC | PRN
  Administered 2022-01-12: 5 mL

## 2022-01-12 MED ORDER — VERAPAMIL HCL 2.5 MG/ML IV SOLN
INTRAVENOUS | Status: AC
  Filled 2022-01-12: qty 2

## 2022-01-12 MED ORDER — SODIUM CHLORIDE 0.9 % IV SOLN: 250.0000 mL | INTRAVENOUS | Status: DC | PRN

## 2022-01-12 MED ORDER — FENTANYL CITRATE (PF) 100 MCG/2ML IJ SOLN
INTRAMUSCULAR | Status: AC
  Filled 2022-01-12: qty 2

## 2022-01-12 MED ORDER — LABETALOL HCL 5 MG/ML IV SOLN: 10.0000 mg | INTRAVENOUS | Status: DC | PRN

## 2022-01-12 MED ORDER — LIDOCAINE HCL (PF) 1 % IJ SOLN
INTRAMUSCULAR | Status: AC
  Filled 2022-01-12: qty 30

## 2022-01-12 MED ORDER — MIDAZOLAM HCL 2 MG/2ML IJ SOLN
INTRAMUSCULAR | Status: AC
  Filled 2022-01-12: qty 2

## 2022-01-12 MED ORDER — SODIUM CHLORIDE 0.9 % IV SOLN: INTRAVENOUS | Status: DC

## 2022-01-12 MED ORDER — HEPARIN (PORCINE) IN NACL 1000-0.9 UT/500ML-% IV SOLN
INTRAVENOUS | Status: DC | PRN
  Administered 2022-01-12 (×2): 500 mL

## 2022-01-12 MED ORDER — SODIUM CHLORIDE 0.9% FLUSH: 3.0000 mL | Freq: Two times a day (BID) | INTRAVENOUS | Status: DC

## 2022-01-12 MED ORDER — ACETAMINOPHEN 325 MG PO TABS: 650.0000 mg | ORAL_TABLET | ORAL | Status: DC | PRN

## 2022-01-12 MED ORDER — HEPARIN (PORCINE) IN NACL 1000-0.9 UT/500ML-% IV SOLN
INTRAVENOUS | Status: AC
  Filled 2022-01-12: qty 1000

## 2022-01-12 MED ORDER — HYDRALAZINE HCL 20 MG/ML IJ SOLN: 10.0000 mg | INTRAMUSCULAR | Status: DC | PRN

## 2022-01-12 MED ORDER — ASPIRIN 81 MG PO CHEW: 81.0000 mg | CHEWABLE_TABLET | ORAL | Status: DC

## 2022-01-12 MED ORDER — HEPARIN SODIUM (PORCINE) 1000 UNIT/ML IJ SOLN
INTRAMUSCULAR | Status: DC | PRN
  Administered 2022-01-12: 6000 [IU] via INTRAVENOUS

## 2022-01-12 MED ORDER — MIDAZOLAM HCL 2 MG/2ML IJ SOLN
INTRAMUSCULAR | Status: DC | PRN
  Administered 2022-01-12: 2 mg via INTRAVENOUS

## 2022-01-12 MED ORDER — HEPARIN SODIUM (PORCINE) 1000 UNIT/ML IJ SOLN
INTRAMUSCULAR | Status: AC
  Filled 2022-01-12: qty 10

## 2022-01-12 MED ORDER — SODIUM CHLORIDE 0.9% FLUSH: 3.0000 mL | INTRAVENOUS | Status: DC | PRN

## 2022-01-12 MED ORDER — FENTANYL CITRATE (PF) 100 MCG/2ML IJ SOLN
INTRAMUSCULAR | Status: DC | PRN
  Administered 2022-01-12: 25 ug via INTRAVENOUS

## 2022-01-12 MED ORDER — ONDANSETRON HCL 4 MG/2ML IJ SOLN: 4.0000 mg | Freq: Four times a day (QID) | INTRAMUSCULAR | Status: DC | PRN

## 2022-01-12 MED ORDER — VERAPAMIL HCL 2.5 MG/ML IV SOLN
INTRAVENOUS | Status: DC | PRN
  Administered 2022-01-12: 10 mL via INTRA_ARTERIAL

## 2022-01-12 MED ORDER — IOHEXOL 350 MG/ML SOLN
INTRAVENOUS | Status: DC | PRN
  Administered 2022-01-12: 50 mL via INTRA_ARTERIAL

## 2022-01-12 SURGICAL SUPPLY — 11 items
CATH 5FR JL3.5 JR4 ANG PIG MP (CATHETERS) IMPLANT
CATH SWAN GANZ 7F STRAIGHT (CATHETERS) IMPLANT
DEVICE RAD COMP TR BAND LRG (VASCULAR PRODUCTS) IMPLANT
GLIDESHEATH SLEND SS 6F .021 (SHEATH) IMPLANT
GLIDESHEATH SLENDER 7FR .021G (SHEATH) IMPLANT
GUIDEWIRE INQWIRE 1.5J.035X260 (WIRE) IMPLANT
INQWIRE 1.5J .035X260CM (WIRE) ×1
KIT HEART LEFT (KITS) ×1 IMPLANT
PACK CARDIAC CATHETERIZATION (CUSTOM PROCEDURE TRAY) ×1 IMPLANT
TRANSDUCER W/STOPCOCK (MISCELLANEOUS) ×1 IMPLANT
TUBING CIL FLEX 10 FLL-RA (TUBING) ×1 IMPLANT

## 2022-01-12 NOTE — Interval H&P Note (Signed)
History and Physical Interval Note:  01/12/2022 12:22 PM  William Fischer  has presented today for surgery, with the diagnosis of systolic hf.  The various methods of treatment have been discussed with the patient and family. After consideration of risks, benefits and other options for treatment, the patient has consented to  Procedure(s): RIGHT/LEFT HEART CATH AND CORONARY ANGIOGRAPHY (N/A) as a surgical intervention.  The patient's history has been reviewed, patient examined, no change in status, stable for surgery.  I have reviewed the patient's chart and labs.  Questions were answered to the patient's satisfaction.     Sherren Mocha

## 2022-01-12 NOTE — Progress Notes (Addendum)
Client states took prednisone at midnight last night, 0700 am this morning and at 1045 this morning; client states took benadryl '50mg'$  at 1045am

## 2022-01-12 NOTE — Discharge Instructions (Signed)

## 2022-01-12 NOTE — Progress Notes (Signed)
Patient was given discharge instructions. He verbalized understanding. 

## 2022-01-13 ENCOUNTER — Encounter (HOSPITAL_COMMUNITY): Payer: Self-pay | Admitting: Cardiovascular Disease

## 2022-01-31 ENCOUNTER — Other Ambulatory Visit: Payer: Self-pay | Admitting: Cardiology

## 2022-01-31 DIAGNOSIS — I4821 Permanent atrial fibrillation: Secondary | ICD-10-CM

## 2022-01-31 DIAGNOSIS — I482 Chronic atrial fibrillation, unspecified: Secondary | ICD-10-CM

## 2022-01-31 NOTE — Telephone Encounter (Signed)
Prescription refill request for Xarelto received.  Indication: Afib  Last office visit: 12/31/21 Gardiner Rhyme) Weight: 137.9kg Age: 57 Scr: 1.21 (12/31/21)  CrCl: 136.75m/min  Appropriate dose and refill sen to requested pharmacy.

## 2022-02-01 ENCOUNTER — Telehealth: Payer: Self-pay

## 2022-02-01 NOTE — Telephone Encounter (Signed)
**Note De-Identified William Fischer Obfuscation** Wilder Glade PA started through covermymeds. Key: [id number]

## 2022-02-01 NOTE — Telephone Encounter (Signed)
**Note De-Identified Corde Antonini Obfuscation** ----- **Note De-Identified Tinya Cadogan Obfuscation** Message from Silverio Lay, RN sent at 01/31/2022 11:02 AM EST ----- Regarding: Jardiance PA Received fax from [facility] stating PA needed for Jardiance 10  Key: [id number]  Thank you!

## 2022-02-08 ENCOUNTER — Telehealth: Payer: Self-pay | Admitting: Licensed Clinical Social Worker

## 2022-02-08 ENCOUNTER — Ambulatory Visit: Payer: Commercial Managed Care - HMO | Attending: Cardiology | Admitting: Cardiology

## 2022-02-08 ENCOUNTER — Encounter: Payer: Self-pay | Admitting: Cardiology

## 2022-02-08 VITALS — BP 132/80 | HR 99 | Ht 70.0 in | Wt 312.0 lb

## 2022-02-08 DIAGNOSIS — I5042 Chronic combined systolic (congestive) and diastolic (congestive) heart failure: Secondary | ICD-10-CM

## 2022-02-08 DIAGNOSIS — E785 Hyperlipidemia, unspecified: Secondary | ICD-10-CM

## 2022-02-08 DIAGNOSIS — I4729 Other ventricular tachycardia: Secondary | ICD-10-CM

## 2022-02-08 DIAGNOSIS — I4891 Unspecified atrial fibrillation: Secondary | ICD-10-CM

## 2022-02-08 MED ORDER — ENTRESTO 24-26 MG PO TABS: 1.0000 | ORAL_TABLET | Freq: Two times a day (BID) | ORAL | 1 refills | Status: DC

## 2022-02-08 MED ORDER — ENTRESTO 24-26 MG PO TABS: 1.0000 | ORAL_TABLET | Freq: Two times a day (BID) | ORAL | 3 refills | Status: DC

## 2022-02-08 NOTE — Patient Instructions (Signed)
Medication Instructions:  Stop Losartan Entresto 24/26 Twice a day *If you need a refill on your cardiac medications before your next appointment, please call your pharmacy*   Lab Work: BMET, Mag today If you have labs (blood work) drawn today and your tests are completely normal, you will receive your results only by: [place] (if you have MyChart) OR A paper copy in the mail If you have any lab test that is abnormal or we need to change your treatment, we will call you to review the results.  Follow-Up: At [facility], you and your health needs are our priority.  As part of our continuing mission to provide you with exceptional heart care, we have created designated Provider Care Teams.  These Care Teams include your primary Cardiologist (physician) and Advanced Practice Providers (APPs -  Physician Assistants and Nurse Practitioners) who all work together to provide you with the care you need, when you need it.  We recommend signing up for the patient portal called "MyChart".  Sign up information is provided on this After Visit Summary.  MyChart is used to connect with patients for Virtual Visits (Telemedicine).  Patients are able to view lab/test results, encounter notes, upcoming appointments, etc.  Non-urgent messages can be sent to your provider as well.   To learn more about what you can do with MyChart, go to [email or web address].    Your next appointment:   02/24/22

## 2022-02-08 NOTE — Progress Notes (Signed)
Cardiology Office Note:    Date:  02/08/2022   ID:  [name], DOB [birth year], MRN [id number]  PCP:  Pcp, No  Cardiologist:  Thompson Grayer, MD  Electrophysiologist:  Vickie Epley, MD   Referring MD: No ref. provider found   Chief Complaint  Patient presents with   Congestive Heart Failure    History of Present Illness:    William Fischer is a 57 y.o. male with a hx of chronic combined systolic and diastolic heart failure, atrial fibrillation, hypertension, mesenteric embolus, colonic perforation in 2016 due to diverticulitis status post hemicolectomy and colostomy that was later reversed, former tobacco use, OSA who presents for follow-up.  He was hospitalized 08/2021 with acute on chronic combined heart failure.  Prior echocardiogram 12/2020 had shown EF 45%.  During admission 08/2021, echo showed EF 35 to 85%, mild RV systolic dysfunction, RVSP 52, severe left atrial enlargement, moderate right atrial enlargement, no significant valvular disease.  He was diuresed with IV Lasix, but unfortunately left AMA.  He had been off all his medicines for 2 months and is unable to afford.  Previously followed with Dr. Rayann Heman in EP, last seen 11/2017.  At that time was felt to likely have permanent A-fib.  However subsequently followed up with EP at [facility] underwent A-fib ablation 03/2019 and was started on amiodarone.  Maintained sinus rhythm for 4 to 5 months but then back to A-fib.  He was following with advanced heart failure at [facility], last seen 12/2020.  At that time, medications included amiodarone 100 mg daily, Toprol-XL 100 mg daily, rosuvastatin 10 mg daily, spironolactone 25 mg daily, torsemide 10 mg daily, Xarelto 20 mg daily, Entresto 24-26 mg twice daily.  Zio patch x 3 days on 12/22/2021 showed 11 episodes of NSVT with longest lasting 14 beats, 100% A-fib burden with average rate 100 bpm, frequent PVCs (9.3% of beats).  RHC/LHC on 01/12/2022 showed normal coronary arteries,  mildly elevated filling pressures (RA 10, RV 46/1, PA 36/24/28, PCWP 21, LVEDP 17, CI 2.0)  Since last clinic visit,  reports has been doing OK. Denies any chest pain, dyspnea, lower extremity edema, or palpitations.  Reports some lightheadedness but denies any syncope.  He is under a lot of stress, reports his mother is being treated for cancer.  Denies any bleeding on Xarelto.   Wt Readings from Last 3 Encounters:  02/08/22 (!) 312 lb (141.5 kg)  01/12/22 (!) 304 lb (137.9 kg)  12/31/21 (!) 307 lb 3.2 oz (139.3 kg)       Past Medical History:  Diagnosis Date   Allergy to IVP dye    Atrial fibrillation ([place])    admx 11/13 with acute sCHF in setting of RVR  => a. failed DCCV x 2; b. Pradaxa started;  c. failed sotalol   Chicken pox as a child   Chronic anticoagulation    Pradaxa   Chronic systolic heart failure ([place])    a. echo 11/13: Ef 40-45%, diff HK, mod MR, mod LAE, mild RVE, mod RAE, small effusion;   b. TEE 11/13:  EF 35-40%, no LAA clot; Echo 2/14 shows normal EF   Depression with anxiety 06/29/2011   Hypertension    Mesenteric embolus ([place]) 12/2014   Migraine 06/29/2011   Obesity 06/29/2011   Snoring    patient needs sleep study - has declined    Past Surgical History:  Procedure Laterality Date   CARDIOVERSION  11/30/2011   Procedure: CARDIOVERSION;  Surgeon:  Thayer Headings, MD;  Location: [place];  Service: Cardiovascular;  Laterality: N/A;   CARDIOVERSION  12/03/2011   Procedure: CARDIOVERSION;  Surgeon: Jolaine Artist, MD;  Location: [facility] OR;  Service: Cardiovascular;  Laterality: N/A;   COLECTOMY  12/07/2015   partial   COLON SURGERY     COLOSTOMY REVERSAL  12/07/2015   COLOSTOMY REVERSAL N/A 12/07/2015   Procedure: OPEN REVERSAL OF COLOSTOMY WITH PARTIAL SIGMOID COLECTOMY;  Surgeon: Leighton Ruff, MD;  Location: [place];  Service: General;  Laterality: N/A;   [place]  12/07/2015   INCISIONAL HERNIA REPAIR N/A  12/07/2015   Procedure: INCISIONAL HERNIA REPAIR;  Surgeon: Leighton Ruff, MD;  Location: [place];  Service: General;  Laterality: N/A;   IR GENERIC HISTORICAL  08/11/2015   IR RADIOLOGIST EVAL & MGMT 08/11/2015 Markus Daft, MD [facility] INTER[facility] RAD   IR GENERIC HISTORICAL  11/18/2015   IR CATHETER TUBE CHANGE 11/18/2015 Corrie Mckusick, DO [facility]   IR GENERIC HISTORICAL  10/15/2015   IR RADIOLOGIST EVAL & MGMT 10/15/2015 Jacqulynn Cadet, MD [facility] INTER[facility]   IR GENERIC HISTORICAL  01/27/2016   IR RADIOLOGIST EVAL & MGMT 01/27/2016 Ascencion Dike, PA-C [facility] INTER[facility] RAD   IR GENERIC HISTORICAL  01/28/2016   IR CATHETER TUBE CHANGE 01/28/2016 Markus Daft, MD [facility]   IR GENERIC HISTORICAL  02/17/2016   IR RADIOLOGIST EVAL & MGMT 02/17/2016 Ascencion Dike, PA-C [facility]   LAPAROTOMY N/A 01/17/2015   Procedure: EXPLORATORY LAPAROTOMY WITH LEFT COLECTOMY AND COLOSTOMY;  Surgeon: Rolm Bookbinder, MD;  Location: [place];  Service: General;  Laterality: N/A;   [place]  11/2015   RIGHT/LEFT HEART CATH AND CORONARY ANGIOGRAPHY N/A 01/12/2022   Procedure: RIGHT/LEFT HEART CATH AND CORONARY ANGIOGRAPHY;  Surgeon: Sherren Mocha, MD;  Location: [place] CV LAB;  Service: Cardiovascular;  Laterality: N/A;   TEE WITHOUT CARDIOVERSION  11/30/2011   Procedure: TRANSESOPHAGEAL ECHOCARDIOGRAM (TEE);  Surgeon: Thayer Headings, MD;  Location: [facility] ENDOSCOPY;  Service: Cardiovascular;  Laterality: N/A;   TONSILLECTOMY      Current Medications: Current Meds  Medication Sig   acetaminophen (TYLENOL) 500 MG tablet Take 1,000 mg by mouth every 6 (six) hours as needed (pain).   dapagliflozin propanediol (FARXIGA) 10 MG TABS tablet Take 1 tablet (10 mg total) by mouth daily.   metoprolol succinate (TOPROL-XL) 50 MG 24 hr tablet Take 2 tablets (100 mg total) by mouth daily. Take with or immediately following a meal.   predniSONE (DELTASONE) 50 MG tablet Take 1 tablet 13  hours prior to procedure, 1 tablet 7 hours prior to procedure, and 1 tablet (with Benedryl 50 mg) prior to going to the hospital for procedure   rivaroxaban (XARELTO) 20 MG TABS tablet TAKE 1 TABLET BY MOUTH DAILY WITH SUPPER.   rosuvastatin (CRESTOR) 20 MG tablet Take 1 tablet (20 mg total) by mouth daily.   spironolactone (ALDACTONE) 25 MG tablet Take 1 tablet (25 mg total) by mouth daily.   torsemide (DEMADEX) 10 MG tablet Take 1 tablet (10 mg total) by mouth daily.   [DISCONTINUED] losartan (COZAAR) 50 MG tablet Take 1 tablet (50 mg total) by mouth daily.   [DISCONTINUED] sacubitril-valsartan (ENTRESTO) 24-26 MG Take 1 tablet by mouth 2 (two) times daily.     Allergies:   Contrast media [iodinated contrast media] and Tramadol   Social History   Socioeconomic History   Marital status: Divorced  Spouse name: Not on file   Number of children: Not on file   Years of education: Not on file   Highest education level: Not on file  Occupational History   Not on file  Tobacco Use   Smoking status: Former    Packs/day: 1.00    Years: 20.00    Total pack years: 20.00    Types: Cigarettes    Quit date: 07/10/2014    Years since quitting: 7.5   Smokeless tobacco: Never  Substance and Sexual Activity   Alcohol use: No    Alcohol/week: 0.0 standard drinks of alcohol   Drug use: No   Sexual activity: Never  Other Topics Concern   Not on file  Social History Narrative   Not on file   Social Determinants of Health   Financial Resource Strain: Not on file  Food Insecurity: Not on file  Transportation Needs: Not on file  Physical Activity: Not on file  Stress: Not on file  Social Connections: Not on file     Family History: The patient's family history includes Alcohol abuse in his father and paternal grandfather; Aneurysm in his father; Cancer in his maternal grandmother and paternal grandmother; Diabetes in his maternal grandfather and paternal grandmother; Hypertension in his  father and mother; Parkinsonism in his maternal grandfather.  ROS:   Please see the history of present illness.     All other systems reviewed and are negative.  EKGs/Labs/Other Studies Reviewed:    The following studies were reviewed today:   EKG:   02/08/2022: Atrial fibrillation, PVCs, rate 99 12/31/21: Afib, PVCs, rate 96 11/22/2021: Atrial fibrillation, low voltage, Q waves V1-4   Recent Labs: 09/01/2021: B Natriuretic Peptide 107.6 11/22/2021: ALT 25; Magnesium 2.0 12/31/2021: BUN 23; Creatinine, Ser 1.21; Platelets 312 01/12/2022: Hemoglobin 17.0; Potassium 4.5; Sodium 136  Recent Lipid Panel    Component Value Date/Time   CHOL 155 11/22/2021 1052   TRIG 73 11/22/2021 1052   HDL 55 11/22/2021 1052   CHOLHDL 2.8 11/22/2021 1052   CHOLHDL 3 07/16/2013 1412   VLDL 16.0 07/16/2013 1412   LDLCALC 86 11/22/2021 1052    Physical Exam:    VS:  BP 132/80 (BP Location: Left Arm, Patient Position: Sitting, Cuff Size: Large)   Pulse 99   Ht '5\' 10"'$  (1.778 m)   Wt (!) 312 lb (141.5 kg)   SpO2 96%   BMI 44.77 kg/m     Wt Readings from Last 3 Encounters:  02/08/22 (!) 312 lb (141.5 kg)  01/12/22 (!) 304 lb (137.9 kg)  12/31/21 (!) 307 lb 3.2 oz (139.3 kg)     GEN:  Well nourished, well developed in no acute distress HEENT: Normal NECK: No JVD; No carotid bruits CARDIAC: RRR, no murmurs, rubs, gallops RESPIRATORY:  Clear to auscultation without rales, wheezing or rhonchi  ABDOMEN: Soft, non-tender, non-distended MUSCULOSKELETAL:  No edema; No deformity  SKIN: Warm and dry NEUROLOGIC:  Alert and oriented x 3 PSYCHIATRIC:  Normal affect   ASSESSMENT:    1. Chronic combined systolic and diastolic heart failure ([place])   2. Hyperlipidemia, unspecified hyperlipidemia type   3. Atrial fibrillation, unspecified type ([place])   4. NSVT (nonsustained ventricular tachycardia) (HCC)      PLAN:    Chronic combined heart failure: EF 35 to 40% on echo 08/2021.  Previously  followed with advanced heart failure at [facility].  RHC/LHC on 01/12/2022 showed normal coronary arteries, mildly elevated filling pressures (RA 10, RV 46/1, PA  36/24/28, PCWP 21, LVEDP 17, CI 2.0).  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 -Repeat echo -Currently on losartan 50 mg daily.  Switch to Entresto 24-26 mg twice daily.  Will check BMET, magnesium -Continue spironolactone 25 mg daily -Continue toprol XL 100 mg daily -Continue Jardiance 10 mg daily -Continue torsemide 10 mg daily.  Appears euvolemic.    Frequent PVCs/NSVT:  Zio patch x 3 days on 12/22/2021 showed 11 episodes of NSVT with longest lasting 14 beats, 100% A-fib burden with average rate 100 bpm, frequent PVCs (9.3% of beats). -Continue toprol XL 100 mg daily -Referred to EP  Atrial fibrillation: Appears permanent.  Zio patch 12/2021 showed 100% A-fib burden with average rate 100 bpm.  Previously on amiodarone but was discontinued as likely chronic A-fib. -Continue Xarelto -Continue Toprol-XL 100 mg daily -Referred to EP  Hypertension: Continue metoprolol and spironolactone and losartan.  Appears controlled  OSA: reports compliance with CPAP  T2DM: A1c 7.2% on 09/01/2021.  On Jardiance.  Need to establish with PCP.  Hyperlipidemia: LDL 86 on 11/22/2021, started on atorvastatin 20 mg daily   RTC in 1 month   Medication Adjustments/Labs and Tests Ordered: Current medicines are reviewed at length with the patient today.  Concerns regarding medicines are outlined above.  Orders Placed This Encounter  Procedures   Magnesium   Basic metabolic panel   Referral to HRT/VAS Care Navigation   EKG 12-Lead   Meds ordered this encounter  Medications   DISCONTD: sacubitril-valsartan (ENTRESTO) 24-26 MG    Sig: Take 1 tablet by mouth 2 (two) times daily.    Dispense:  90 tablet    Refill:  3   sacubitril-valsartan (ENTRESTO) 24-26 MG    Sig: Take 1 tablet by mouth 2 (two) times daily.    Dispense:   60 tablet    Refill:  1    D/c previous rx    Patient Instructions  Medication Instructions:  Stop Losartan Entresto 24/26 Twice a day *If you need a refill on your cardiac medications before your next appointment, please call your pharmacy*   Lab Work: BMET, Mag today If you have labs (blood work) drawn today and your tests are completely normal, you will receive your results only by: [place] (if you have MyChart) OR A paper copy in the mail If you have any lab test that is abnormal or we need to change your treatment, we will call you to review the results.  Follow-Up: At [facility], you and your health needs are our priority.  As part of our continuing mission to provide you with exceptional heart care, we have created designated Provider Care Teams.  These Care Teams include your primary Cardiologist (physician) and Advanced Practice Providers (APPs -  Physician Assistants and Nurse Practitioners) who all work together to provide you with the care you need, when you need it.  We recommend signing up for the patient portal called "MyChart".  Sign up information is provided on this After Visit Summary.  MyChart is used to connect with patients for Virtual Visits (Telemedicine).  Patients are able to view lab/test results, encounter notes, upcoming appointments, etc.  Non-urgent messages can be sent to your provider as well.   To learn more about what you can do with MyChart, go to [email or web address].    Your next appointment:   02/24/22        Signed, Donato Heinz, MD  02/08/2022 6:05 PM    [place]  [facility]

## 2022-02-08 NOTE — Telephone Encounter (Signed)
H&V Care Navigation CSW Progress Note  Clinical Social Worker contacted patient by phone to f/u on questions related to coverage and finances. LCSW notes that pt has [place] which RN shared pt had received correspondence that it has termed and [place] Medicaid WellCare. I note [employer] and [facility] Medicaid are both active on NCTracks, pt may need to go to DSS with letter from [place] to update his account. This may help with medication costs and ensure coverage providers are not butting heads.   Reached pt at [phone], he is currently in the car driving. For safety reasons I requested that pt be at home/not driving when we speak. We agreed to touch base tomorrow at 930am.   Patient is participating in a Managed Medicaid Plan:  [place] Medicaid  SDOH Screenings   Tobacco Use: Medium Risk (02/08/2022)   Westley Hummer, MSW, [place]  [phone]- work cell phone (preferred) [phone]- desk phone

## 2022-02-09 ENCOUNTER — Telehealth: Payer: Self-pay | Admitting: Licensed Clinical Social Worker

## 2022-02-09 ENCOUNTER — Telehealth: Payer: Self-pay

## 2022-02-09 LAB — BASIC METABOLIC PANEL
BUN/Creatinine Ratio: 18 (ref 9–20)
BUN: 17 mg/dL (ref 6–24)
CO2: 25 mmol/L (ref 20–29)
Calcium: 10 mg/dL (ref 8.7–10.2)
Chloride: 95 mmol/L — ABNORMAL LOW (ref 96–106)
Creatinine, Ser: 0.93 mg/dL (ref 0.76–1.27)
Glucose: 316 mg/dL — ABNORMAL HIGH (ref 70–99)
Potassium: 5 mmol/L (ref 3.5–5.2)
Sodium: 137 mmol/L (ref 134–144)
eGFR: 98 mL/min/{1.73_m2} (ref >59)

## 2022-02-09 LAB — MAGNESIUM: Magnesium: 2.1 mg/dL (ref 1.6–2.3)

## 2022-02-09 NOTE — Telephone Encounter (Signed)
H&V Care Navigation CSW Progress Note  Clinical Social Worker contacted patient by phone to f/u on referral. We had discussed speaking this morning. Phone went straight to voicemail- I will re-attempt again tomorrow if pt doesn't return my call today.   Patient is participating in a Managed Medicaid Plan:  [place] Medicaid  SDOH Screenings   Tobacco Use: Medium Risk (02/08/2022)    Westley Hummer, MSW, [place]  [phone]- work cell phone (preferred) [phone]- desk phone

## 2022-02-09 NOTE — Telephone Encounter (Signed)
**Note De-Identified William Fischer Obfuscation** William Fischer ([name]) PA Case ID #: [id number] Outcome Approved today This drug has been approved. Approved quantity: 60 tablets per 30 day(s).  Authorization Expiration Date: 02/09/2023 Drug Entresto 24-'26MG'$  tablets ePA cloud logo Form [facility] Medicaid of [place] Prior Authorization Request Form [phone] NCPDP)  I have notified [id number] [place], [place] - [address](Ph: [phone] this approval and I did advise that Xarelto does not require a PA per CMM/Wellcare.

## 2022-02-09 NOTE — Telephone Encounter (Signed)
H&V Care Navigation CSW Progress Note  Clinical Social Worker  received a call back  to f/u on questions regarding coverage challenges. LCSW introduced self, role, reason for call. Pt confirmed home address, no PCP, lives alone and his emergency contacts remain his mother and sister. Per RN he has disability pending. Pt shares that he received a letter from [place] stating that he was dropped from his commercial plan after his procedure in December. LCSW shared that he was enrolled per what I can see on NCTracks with [facility] Medicaid as of 12/18/2022. I shared that since he had alternate coverage [employer] may have termed his coverage, he shares he has thrown away the letter he received from [place] explaining termed coverage. LCSW encouraged pt to reach out to [facility] and get another letter sent to him for his records and bring that to [place]. At [place] they can take alternate coverage off his account and it will allow for Medicaid to be primary coverage for medical bills and medications. I also encouraged him to speak with billing and give them a copy of that letter as well so he can have the procedure re-billed through Medicaid if Cigna indeed has termed/isnt covering that procedure.  No additional questions from pt at this time.   Patient is participating in a Managed Medicaid Plan:  Yes-Wellcare Medicaid  [place]   Transportation Needs: No Transportation Needs (02/09/2022)  Financial Resource Strain: Medium Risk (02/09/2022)  Tobacco Use: Medium Risk (02/08/2022)    Westley Hummer, MSW, [place]  [phone]- work cell phone (preferred) [phone]- desk phone

## 2022-02-09 NOTE — Telephone Encounter (Signed)
**Note De-Identified Maricela Schreur Obfuscation** Denese Killings (Key: Earlean Shawl) Xarelto '20MG'$  tablets  Determination Message from Plan Prior Authorization Not Required Form [facility] Medicaid of [place] Prior Authorization Request Form [phone] NCPDP)  Delene Loll PA stated through covermymeds. Key: Gerald Leitz

## 2022-02-09 NOTE — Telephone Encounter (Signed)
**Note De-Identified Chelsi Warr Obfuscation** ----- **Note De-Identified Stephene Alegria Obfuscation** Message from Silverio Lay, RN sent at 02/08/2022  3:45 PM EST ----- Regarding: PA needed PA needed for Xarelto and Entresto Patient now only has medicaid (card scanned in)  Thank you!!!

## 2022-02-09 NOTE — Telephone Encounter (Signed)
**Note De-Identified Korryn Pancoast Obfuscation** Dereck Agerton (Key: [id number]) PA Case ID #: [id number] Rx #: [id number] Outcome Approved on January 16 Approved. This drug has been approved. Approved quantity: 30 tablets per 30 day(s).  Authorization Expiration Date: 01/16/2098 Drug Dapagliflozin Propanediol '10MG'$  tablets ePA cloud logo Form [facility] Medicaid of [place] Prior Authorization Request Form [phone] NCPDP)  I have notified [id number] [place], [place] - [id number] (Ph: [phone] of this approval.

## 2022-02-11 ENCOUNTER — Other Ambulatory Visit: Payer: Self-pay | Admitting: *Deleted

## 2022-02-11 DIAGNOSIS — I5042 Chronic combined systolic (congestive) and diastolic (congestive) heart failure: Secondary | ICD-10-CM

## 2022-02-11 DIAGNOSIS — Z79899 Other long term (current) drug therapy: Secondary | ICD-10-CM

## 2022-02-17 ENCOUNTER — Telehealth: Payer: Self-pay | Admitting: Cardiology

## 2022-02-17 DIAGNOSIS — E119 Type 2 diabetes mellitus without complications: Secondary | ICD-10-CM

## 2022-02-17 DIAGNOSIS — I5042 Chronic combined systolic (congestive) and diastolic (congestive) heart failure: Secondary | ICD-10-CM

## 2022-02-17 NOTE — Telephone Encounter (Signed)
William Heinz, MD 02/10/2022  7:05 AM EST     Recommend repeat BMET in 1 week with starting Entresto.   Glucose is significantly elevated.  He needs management of his diabetes.  He has declined to establish with a PCP.  Recommend referral to endocrinology    Patient aware and verbalized understanding.   Patient request to have labs drawn when echo completed.  Will place order.    Patient reports seeing Dr. Forrestine Him with [facility] in the past for endocrinology and request to be referred back to him.  Last OV 2022 per chart review.  Will send referral.

## 2022-02-17 NOTE — Telephone Encounter (Signed)
Spoke with patient to give him lab results. He said, "I want to talk to [facility]." I informed him I will forward this message to [facility].

## 2022-02-17 NOTE — Telephone Encounter (Signed)
Patient is returning call to discuss lab results. 

## 2022-02-22 ENCOUNTER — Ambulatory Visit: Payer: Commercial Managed Care - HMO | Attending: Cardiology

## 2022-02-22 ENCOUNTER — Ambulatory Visit: Payer: Commercial Managed Care - HMO

## 2022-02-22 ENCOUNTER — Other Ambulatory Visit (HOSPITAL_COMMUNITY): Payer: Commercial Managed Care - HMO

## 2022-02-22 DIAGNOSIS — E119 Type 2 diabetes mellitus without complications: Secondary | ICD-10-CM | POA: Diagnosis not present

## 2022-02-22 DIAGNOSIS — I5042 Chronic combined systolic (congestive) and diastolic (congestive) heart failure: Secondary | ICD-10-CM | POA: Diagnosis not present

## 2022-02-22 DIAGNOSIS — Z79899 Other long term (current) drug therapy: Secondary | ICD-10-CM | POA: Diagnosis not present

## 2022-02-22 LAB — ECHOCARDIOGRAM COMPLETE
Area-P 1/2: 4.74 cm2
MV M vel: 4.97 m/s
MV Peak grad: 98.8 mmHg
S' Lateral: 4.3 cm

## 2022-02-22 MED ORDER — PERFLUTREN LIPID MICROSPHERE
1.0000 mL | INTRAVENOUS | Status: AC | PRN
  Administered 2022-02-22: 2 mL via INTRAVENOUS

## 2022-02-22 NOTE — Progress Notes (Incomplete)
Cardiology Office Note:    Date:  02/22/2022   ID:  TADEO BESECKER, DOB [birth year], MRN [id number]  PCP:  Forrestine Him, PA-C  Cardiologist:  Donato Heinz, MD  Electrophysiologist:  Vickie Epley, MD   Referring MD: No ref. provider found   No chief complaint on file.   History of Present Illness:    William Fischer is a 57 y.o. male with a hx of chronic combined systolic and diastolic heart failure, atrial fibrillation, hypertension, mesenteric embolus, colonic perforation in 2016 due to diverticulitis status post hemicolectomy and colostomy that was later reversed, former tobacco use, OSA who presents for follow-up.  He was hospitalized 08/2021 with acute on chronic combined heart failure.  Prior echocardiogram 12/2020 had shown EF 45%.  During admission 08/2021, echo showed EF 35 to 20%, mild RV systolic dysfunction, RVSP 52, severe left atrial enlargement, moderate right atrial enlargement, no significant valvular disease.  He was diuresed with IV Lasix, but unfortunately left AMA.  He had been off all his medicines for 2 months and is unable to afford.  Previously followed with Dr. Rayann Heman in EP, last seen 11/2017.  At that time was felt to likely have permanent A-fib.  However subsequently followed up with EP at [facility] underwent A-fib ablation 03/2019 and was started on amiodarone.  Maintained sinus rhythm for 4 to 5 months but then back to A-fib.  He was following with advanced heart failure at [facility], last seen 12/2020.  At that time, medications included amiodarone 100 mg daily, Toprol-XL 100 mg daily, rosuvastatin 10 mg daily, spironolactone 25 mg daily, torsemide 10 mg daily, Xarelto 20 mg daily, Entresto 24-26 mg twice daily.  Zio patch x 3 days on 12/22/2021 showed 11 episodes of NSVT with longest lasting 14 beats, 100% A-fib burden with average rate 100 bpm, frequent PVCs (9.3% of beats).  RHC/LHC on 01/12/2022 showed normal coronary arteries, mildly elevated  filling pressures (RA 10, RV 46/1, PA 36/24/28, PCWP 21, LVEDP 17, CI 2.0).  Echocardiogram 02/22/2022 showed***  Since last clinic visit,  reports has been doing OK. Denies any chest pain, dyspnea, lower extremity edema, or palpitations.  Reports some lightheadedness but denies any syncope.  He is under a lot of stress, reports his mother is being treated for cancer.  Denies any bleeding on Xarelto.   Wt Readings from Last 3 Encounters:  02/08/22 (!) 312 lb (141.5 kg)  01/12/22 (!) 304 lb (137.9 kg)  12/31/21 (!) 307 lb 3.2 oz (139.3 kg)       Past Medical History:  Diagnosis Date   Allergy to IVP dye    Atrial fibrillation (HCC)    admx 11/13 with acute sCHF in setting of RVR  => a. failed DCCV x 2; b. Pradaxa started;  c. failed sotalol   Chicken pox as a child   Chronic anticoagulation    Pradaxa   Chronic systolic heart failure ([place])    a. echo 11/13: Ef 40-45%, diff HK, mod MR, mod LAE, mild RVE, mod RAE, small effusion;   b. TEE 11/13:  EF 35-40%, no LAA clot; Echo 2/14 shows normal EF   Depression with anxiety 06/29/2011   Hypertension    Mesenteric embolus ([place]) 12/2014   Migraine 06/29/2011   Obesity 06/29/2011   Snoring    patient needs sleep study - has declined    Past Surgical History:  Procedure Laterality Date   CARDIOVERSION  11/30/2011   Procedure: CARDIOVERSION;  Surgeon: Arnette Norris  Deboraha Sprang, MD;  Location: [facility];  Service: Cardiovascular;  Laterality: N/A;   CARDIOVERSION  12/03/2011   Procedure: CARDIOVERSION;  Surgeon: Jolaine Artist, MD;  Location: [facility] OR;  Service: Cardiovascular;  Laterality: N/A;   COLECTOMY  12/07/2015   partial   COLON SURGERY     COLOSTOMY REVERSAL  12/07/2015   COLOSTOMY REVERSAL N/A 12/07/2015   Procedure: OPEN REVERSAL OF COLOSTOMY WITH PARTIAL SIGMOID COLECTOMY;  Surgeon: Leighton Ruff, MD;  Location: [facility];  Service: General;  Laterality: N/A;   [place]  12/07/2015   INCISIONAL HERNIA  REPAIR N/A 12/07/2015   Procedure: INCISIONAL HERNIA REPAIR;  Surgeon: Leighton Ruff, MD;  Location: [facility];  Service: General;  Laterality: N/A;   IR GENERIC HISTORICAL  08/11/2015   IR RADIOLOGIST EVAL & MGMT 08/11/2015 Markus Daft, MD [facility]   IR GENERIC HISTORICAL  11/18/2015   IR CATHETER TUBE CHANGE 11/18/2015 Corrie Mckusick, DO [facility]   IR GENERIC HISTORICAL  10/15/2015   IR RADIOLOGIST EVAL & MGMT 10/15/2015 Jacqulynn Cadet, MD [facility]   IR GENERIC HISTORICAL  01/27/2016   IR RADIOLOGIST EVAL & MGMT 01/27/2016 Ascencion Dike, PA-C [facility]   IR GENERIC HISTORICAL  01/28/2016   IR CATHETER TUBE CHANGE 01/28/2016 Markus Daft, MD [facility]   IR GENERIC HISTORICAL  02/17/2016   IR RADIOLOGIST EVAL & MGMT 02/17/2016 Ascencion Dike, PA-C [facility]   LAPAROTOMY N/A 01/17/2015   Procedure: EXPLORATORY LAPAROTOMY WITH LEFT COLECTOMY AND COLOSTOMY;  Surgeon: Rolm Bookbinder, MD;  Location: [facility];  Service: General;  Laterality: N/A;   [place]  11/2015   RIGHT/LEFT HEART CATH AND CORONARY ANGIOGRAPHY N/A 01/12/2022   Procedure: RIGHT/LEFT HEART CATH AND CORONARY ANGIOGRAPHY;  Surgeon: Sherren Mocha, MD;  Location: [facility];  Service: Cardiovascular;  Laterality: N/A;   TEE WITHOUT CARDIOVERSION  11/30/2011   Procedure: TRANSESOPHAGEAL ECHOCARDIOGRAM (TEE);  Surgeon: Thayer Headings, MD;  Location[facility]OSCOPY;  Service: Cardiovascular;  Laterality: N/A;   TONSILLECTOMY      Current Medications: No outpatient medications have been marked as taking for the 02/24/22 encounter (Appointment) with Donato Heinz, MD.     Allergies:   Contrast media [iodinated contrast media] and Tramadol   Social History   Socioeconomic History   Marital status: Divorced    Spouse name: Not on file   Number of children: Not on file   Years of education: Not on file   Highest education level: Not on file   Occupational History   Not on file  Tobacco Use   Smoking status: Former    Packs/day: 1.00    Years: 20.00    Total pack years: 20.00    Types: Cigarettes    Quit date: 07/10/2014    Years since quitting: 7.6   Smokeless tobacco: Never  Substance and Sexual Activity   Alcohol use: No    Alcohol/week: 0.0 standard drinks of alcohol   Drug use: No   Sexual activity: Never  Other Topics Concern   Not on file  Social History Narrative   Not on file   Social Determinants of Health   Financial Resource Strain: Medium Risk (02/09/2022)   Overall Financial Resource Strain (CARDIA)    Difficulty of Paying Living Expenses: Somewhat hard  Food Insecurity: Not on file  Transportation Needs: No Transportation Needs (02/09/2022)   [place] - Transportation  Lack of Transportation (Medical): No    Lack of Transportation (Non-Medical): No  Physical Activity: Not on file  Stress: Not on file  Social Connections: Not on file     Family History: The patient's family history includes Alcohol abuse in his father and paternal grandfather; Aneurysm in his father; Cancer in his maternal grandmother and paternal grandmother; Diabetes in his maternal grandfather and paternal grandmother; Hypertension in his father and mother; Parkinsonism in his maternal grandfather.  ROS:   Please see the history of present illness.     All other systems reviewed and are negative.  EKGs/Labs/Other Studies Reviewed:    The following studies were reviewed today:   EKG:   02/08/2022: Atrial fibrillation, PVCs, rate 99 12/31/21: Afib, PVCs, rate 96 11/22/2021: Atrial fibrillation, low voltage, Q waves V1-4   Recent Labs: 09/01/2021: B Natriuretic Peptide 107.6 11/22/2021: ALT 25 12/31/2021: Platelets 312 01/12/2022: Hemoglobin 17.0 02/08/2022: BUN 17; Creatinine, Ser 0.93; Magnesium 2.1; Potassium 5.0; Sodium 137  Recent Lipid Panel    Component Value Date/Time   CHOL 155 11/22/2021 1052   TRIG 73  11/22/2021 1052   HDL 55 11/22/2021 1052   CHOLHDL 2.8 11/22/2021 1052   CHOLHDL 3 07/16/2013 1412   VLDL 16.0 07/16/2013 1412   LDLCALC 86 11/22/2021 1052    Physical Exam:    VS:  There were no vitals taken for this visit.    Wt Readings from Last 3 Encounters:  02/08/22 (!) 312 lb (141.5 kg)  01/12/22 (!) 304 lb (137.9 kg)  12/31/21 (!) 307 lb 3.2 oz (139.3 kg)     GEN:  Well nourished, well developed in no acute distress HEENT: Normal NECK: No JVD; No carotid bruits CARDIAC: RRR, no murmurs, rubs, gallops RESPIRATORY:  Clear to auscultation without rales, wheezing or rhonchi  ABDOMEN: Soft, non-tender, non-distended MUSCULOSKELETAL:  No edema; No deformity  SKIN: Warm and dry NEUROLOGIC:  Alert and oriented x 3 PSYCHIATRIC:  Normal affect   ASSESSMENT:    No diagnosis found.    PLAN:    Chronic combined heart failure: EF 35 to 40% on echo 08/2021.  Previously followed with advanced heart failure at [facility].  [place] on 01/12/2022 showed normal coronary arteries, mildly elevated filling pressures (RA 10, RV 46/1, PA 36/24/28, PCWP 21, LVEDP 17, CI 2.0).  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 -Repeat echo -Continue Entresto 24-26 mg twice daily.  Will check BMET, magnesium -Continue spironolactone 25 mg daily -Continue toprol XL 100 mg daily -Continue Jardiance 10 mg daily -Continue torsemide 10 mg daily.  Appears euvolemic.    Frequent PVCs/NSVT:  Zio patch x 3 days on 12/22/2021 showed 11 episodes of NSVT with longest lasting 14 beats, 100% A-fib burden with average rate 100 bpm, frequent PVCs (9.3% of beats). -Continue toprol XL 100 mg daily -Referred to EP  Atrial fibrillation: Appears permanent.  Zio patch 12/2021 showed 100% A-fib burden with average rate 100 bpm.  Previously on amiodarone but was discontinued as likely chronic A-fib. -Continue Xarelto -Continue Toprol-XL 100 mg daily -Referred to EP  Hypertension: Continue  metoprolol and spironolactone and losartan.  Appears controlled  OSA: reports compliance with CPAP  T2DM: A1c 7.2% on 09/01/2021.  On Jardiance.  Managed by PCP  Hyperlipidemia: LDL 86 on 11/22/2021, started on atorvastatin 20 mg daily   RTC in***   Medication Adjustments/Labs and Tests Ordered: Current medicines are reviewed at length with the patient today.  Concerns regarding medicines are outlined above.  No orders of the defined types were placed in this encounter.  No orders of the defined types were placed in this encounter.   There are no Patient Instructions on file for this visit.   Signed, Donato Heinz, MD  02/22/2022 8:58 PM    [place]

## 2022-02-23 LAB — BASIC METABOLIC PANEL
BUN/Creatinine Ratio: 14 (ref 9–20)
BUN: 17 mg/dL (ref 6–24)
CO2: 26 mmol/L (ref 20–29)
Calcium: 10.8 mg/dL — ABNORMAL HIGH (ref 8.7–10.2)
Chloride: 95 mmol/L — ABNORMAL LOW (ref 96–106)
Creatinine, Ser: 1.21 mg/dL (ref 0.76–1.27)
Glucose: 313 mg/dL — ABNORMAL HIGH (ref 70–99)
Potassium: 4.7 mmol/L (ref 3.5–5.2)
Sodium: 139 mmol/L (ref 134–144)
eGFR: 71 mL/min/{1.73_m2} (ref >59)

## 2022-02-24 ENCOUNTER — Ambulatory Visit: Payer: Medicaid Other | Admitting: Cardiology

## 2022-02-24 ENCOUNTER — Encounter: Payer: Self-pay | Admitting: Cardiology

## 2022-02-24 ENCOUNTER — Ambulatory Visit: Payer: Commercial Managed Care - HMO | Attending: Cardiology | Admitting: Cardiology

## 2022-02-24 VITALS — BP 120/80 | HR 99 | Ht 70.0 in | Wt 309.6 lb

## 2022-02-24 DIAGNOSIS — I4821 Permanent atrial fibrillation: Secondary | ICD-10-CM | POA: Diagnosis not present

## 2022-02-24 DIAGNOSIS — I5042 Chronic combined systolic (congestive) and diastolic (congestive) heart failure: Secondary | ICD-10-CM

## 2022-02-24 DIAGNOSIS — I10 Essential (primary) hypertension: Secondary | ICD-10-CM | POA: Diagnosis not present

## 2022-02-24 DIAGNOSIS — I4729 Other ventricular tachycardia: Secondary | ICD-10-CM | POA: Diagnosis not present

## 2022-02-24 DIAGNOSIS — E785 Hyperlipidemia, unspecified: Secondary | ICD-10-CM

## 2022-02-24 NOTE — Patient Instructions (Signed)
Medication Instructions:  Your physician recommends that you continue on your current medications as directed. Please refer to the Current Medication list given to you today.  *If you need a refill on your cardiac medications before your next appointment, please call your pharmacy*  Testing/Procedures: Your physician has requested that you have a cardiac MRI. Cardiac MRI uses a computer to create images of your heart as its beating, producing both still and moving pictures of your heart and major blood vessels. For further information please visit [email or web address] Please follow the instruction sheet given to you today for more information.  Follow-Up: At [facility], you and your health needs are our priority.  As part of our continuing mission to provide you with exceptional heart care, we have created designated Provider Care Teams.  These Care Teams include your primary Cardiologist (physician) and Advanced Practice Providers (APPs -  Physician Assistants and Nurse Practitioners) who all work together to provide you with the care you need, when you need it.  We recommend signing up for the patient portal called "MyChart".  Sign up information is provided on this After Visit Summary.  MyChart is used to connect with patients for Virtual Visits (Telemedicine).  Patients are able to view lab/test results, encounter notes, upcoming appointments, etc.  Non-urgent messages can be sent to your provider as well.   To learn more about what you can do with MyChart, go to [email or web address].    Your next appointment:   3 month(s)  Provider:   Donato Heinz, MD     Other Instructions   You are scheduled for Cardiac MRI on ______________. Please arrive for your appointment at ______________ ( arrive 30-45 minutes prior to test start time). ?  [facility] [address] [place], [place] [address] [phone] Please take advantage of the free valet parking  available at the MAIN entrance (A entrance).  Proceed to the [facility] Radiology Department (First Floor) for check-in.   [facility] [place], [place] [address] [phone] Please take advantage of the free valet parking available at the MAIN entrance. Proceed to [facility] registration for check-in (first floor).  Magnetic resonance imaging (MRI) is a painless test that produces images of the inside of the body without using Xrays.  During an MRI, strong magnets and radio waves work together in a [employer] officer, political party to form detailed images.   MRI images may provide more details about a medical condition than X-rays, CT scans, and ultrasounds can provide.  You may be given earphones to listen for instructions.  You may eat a light breakfast and take medications as ordered with the exception of furosemide, hydrochlorothiazide, or spironolactone(fluid pill, other). Please avoid stimulants for 12 hr prior to test. (Ie. Caffeine, nicotine, chocolate, or antihistamine medications)  If a contrast material will be used, an IV will be inserted into one of your veins. Contrast material will be injected into your IV. It will leave your body through your urine within a day. You may be told to drink plenty of fluids to help flush the contrast material out of your system.  You will be asked to remove all metal, including: Watch, jewelry, and other metal objects including hearing aids, hair pieces and dentures. Also wearable glucose monitoring systems (ie. Freestyle Libre and Omnipods) (Braces and fillings normally are not a problem.)   TEST WILL TAKE APPROXIMATELY 1 HOUR  PLEASE NOTIFY SCHEDULING AT LEAST 24 HOURS IN ADVANCE  IF YOU ARE UNABLE TO KEEP YOUR APPOINTMENT. [phone]  Please call Marchia Bond, cardiac imaging nurse navigator with any questions/concerns. Marchia Bond RN Navigator Cardiac Imaging Gordy Clement RN Navigator Cardiac Imaging [facility] [phone] Office

## 2022-02-24 NOTE — Progress Notes (Signed)
Cardiology Office Note:    Date:  02/24/2022   ID:  William Fischer, DOB 1965-03-21, MRN [id number]  PCP:  Forrestine Him, PA-C  Cardiologist:  Donato Heinz, MD  Electrophysiologist:  Vickie Epley, MD   Referring MD: Forrestine Him, PA-C   Chief Complaint  Patient presents with   Congestive Heart Failure    History of Present Illness:    William Fischer is a 57 y.o. male with a hx of chronic combined systolic and diastolic heart failure, atrial fibrillation, hypertension, mesenteric embolus, colonic perforation in 2016 due to diverticulitis status post hemicolectomy and colostomy that was later reversed, former tobacco use, OSA who presents for follow-up.  He was hospitalized 08/2021 with acute on chronic combined heart failure.  Prior echocardiogram 12/2020 had shown EF 45%.  During admission 08/2021, echo showed EF 35 to 86%, mild RV systolic dysfunction, RVSP 52, severe left atrial enlargement, moderate right atrial enlargement, no significant valvular disease.  He was diuresed with IV Lasix, but unfortunately left AMA.  He had been off all his medicines for 2 months and is unable to afford.  Previously followed with Dr. Rayann Heman in [place], last seen 11/2017.  At that time was felt to likely have permanent A-fib.  However subsequently followed up with EP at [facility] underwent A-fib ablation 03/2019 and was started on amiodarone.  Maintained sinus rhythm for 4 to 5 months but then back to A-fib.  He was following with advanced heart failure at [facility], last seen 12/2020.  At that time, medications included amiodarone 100 mg daily, Toprol-XL 100 mg daily, rosuvastatin 10 mg daily, spironolactone 25 mg daily, torsemide 10 mg daily, Xarelto 20 mg daily, Entresto 24-26 mg twice daily.  Zio patch x 3 days on 12/22/2021 showed 11 episodes of NSVT with longest lasting 14 beats, 100% A-fib burden with average rate 100 bpm, frequent PVCs (9.3% of beats).  RHC/LHC on 01/12/2022 showed normal  coronary arteries, mildly elevated filling pressures (RA 10, RV 46/1, PA 36/24/28, PCWP 21, LVEDP 17, CI 2.0).  Echocardiogram 02/22/2022 showed EF 35 to 40%, normal RV function, severe right atrial enlargement, moderate left atrial enlargement, moderate to severe mitral regurgitation.  Since last clinic visit, he reports he has been doing okay.  Denies any chest pain, dyspnea, lightheadedness, syncope, lower extremity edema, or palpitations. No bleeding issues on Xarelto     Past Medical History:  Diagnosis Date   Allergy to IVP dye    Atrial fibrillation ([place])    admx 11/13 with acute sCHF in setting of RVR  => a. failed DCCV x 2; b. Pradaxa started;  c. failed sotalol   Chicken pox as a child   Chronic anticoagulation    Pradaxa   Chronic systolic heart failure ([place])    a. echo 11/13: Ef 40-45%, diff HK, mod MR, mod LAE, mild RVE, mod RAE, small effusion;   b. TEE 11/13:  EF 35-40%, no LAA clot; Echo 2/14 shows normal EF   Depression with anxiety 06/29/2011   Hypertension    Mesenteric embolus ([place]) 12/2014   Migraine 06/29/2011   Obesity 06/29/2011   Snoring    patient needs sleep study - has declined    Past Surgical History:  Procedure Laterality Date   CARDIOVERSION  11/30/2011   Procedure: CARDIOVERSION;  Surgeon: Thayer Headings, MD;  Location: [place];  Service: Cardiovascular;  Laterality: N/A;   CARDIOVERSION  12/03/2011   Procedure: CARDIOVERSION;  Surgeon: Jolaine Artist, MD;  Location: [place] [facility];  Service: Cardiovascular;  Laterality: N/A;   COLECTOMY  12/07/2015   partial   COLON SURGERY     COLOSTOMY REVERSAL  12/07/2015   COLOSTOMY REVERSAL N/A 12/07/2015   Procedure: OPEN REVERSAL OF COLOSTOMY WITH PARTIAL SIGMOID COLECTOMY;  Surgeon: Leighton Ruff, MD;  Location: [place];  Service: General;  Laterality: N/A;   [place]  12/07/2015   INCISIONAL HERNIA REPAIR N/A 12/07/2015   Procedure: INCISIONAL HERNIA REPAIR;  Surgeon:  Leighton Ruff, MD;  Location: [place];  Service: General;  Laterality: N/A;   IR GENERIC HISTORICAL  08/11/2015   IR RADIOLOGIST EVAL & MGMT 08/11/2015 Markus Daft, MD [facility]   IR GENERIC HISTORICAL  11/18/2015   IR CATHETER TUBE CHANGE 11/18/2015 Corrie Mckusick, DO [facility]   IR GENERIC HISTORICAL  10/15/2015   IR RADIOLOGIST EVAL & MGMT 10/15/2015 Jacqulynn Cadet, MD [facility]   IR GENERIC HISTORICAL  01/27/2016   IR RADIOLOGIST EVAL & MGMT 01/27/2016 Ascencion Dike, PA-C [facility]   IR GENERIC HISTORICAL  01/28/2016   IR CATHETER TUBE CHANGE 01/28/2016 Markus Daft, MD [facility]   IR GENERIC HISTORICAL  02/17/2016   IR RADIOLOGIST EVAL & MGMT 02/17/2016 Ascencion Dike, PA-C [facility]   LAPAROTOMY N/A 01/17/2015   Procedure: EXPLORATORY LAPAROTOMY WITH LEFT COLECTOMY AND COLOSTOMY;  Surgeon: Rolm Bookbinder, MD;  Location: [place];  Service: General;  Laterality: N/A;   [place]  11/2015   RIGHT/LEFT HEART CATH AND CORONARY ANGIOGRAPHY N/A 01/12/2022   Procedure: RIGHT/LEFT HEART CATH AND CORONARY ANGIOGRAPHY;  Surgeon: Sherren Mocha, MD;  Location: [place] [facility];  Service: Cardiovascular;  Laterality: N/A;   TEE WITHOUT CARDIOVERSION  11/30/2011   Procedure: TRANSESOPHAGEAL ECHOCARDIOGRAM (TEE);  Surgeon: Thayer Headings, MD;  Location: [facility] ENDOSCOPY;  Service: Cardiovascular;  Laterality: N/A;   TONSILLECTOMY      Current Medications: Current Meds  Medication Sig   b complex vitamins capsule Take 1 capsule by mouth daily.   dapagliflozin propanediol (FARXIGA) 10 MG TABS tablet Take 1 tablet (10 mg total) by mouth daily.   metoprolol tartrate (LOPRESSOR) 100 MG tablet Take 100 mg by mouth 2 (two) times daily.   Multiple Vitamins-Minerals (ONE-A-DAY 50 PLUS PO) Take 1 capsule by mouth daily in the afternoon.   rivaroxaban (XARELTO) 20 MG TABS tablet TAKE 1 TABLET BY MOUTH DAILY WITH SUPPER.   rosuvastatin (CRESTOR)  20 MG tablet Take 1 tablet (20 mg total) by mouth daily.   sacubitril-valsartan (ENTRESTO) 24-26 MG Take 1 tablet by mouth 2 (two) times daily.   spironolactone (ALDACTONE) 25 MG tablet Take 1 tablet (25 mg total) by mouth daily.   torsemide (DEMADEX) 10 MG tablet Take 1 tablet (10 mg total) by mouth daily.     Allergies:   Contrast media [iodinated contrast media] and Tramadol   Social History   Socioeconomic History   Marital status: Divorced    Spouse name: Not on file   Number of children: Not on file   Years of education: Not on file   Highest education level: Not on file  Occupational History   Not on file  Tobacco Use   Smoking status: Former    Packs/day: 1.00    Years: 20.00    Total pack years: 20.00    Types: Cigarettes    Quit date: 07/10/2014    Years since quitting: 7.6  Smokeless tobacco: Never  Substance and Sexual Activity   Alcohol use: No    Alcohol/week: 0.0 standard drinks of alcohol   Drug use: No   Sexual activity: Never  Other Topics Concern   Not on file  Social History Narrative   Not on file   Social Determinants of Health   Financial Resource Strain: Medium Risk (02/09/2022)   Overall Financial Resource Strain (CARDIA)    Difficulty of Paying Living Expenses: Somewhat hard  Food Insecurity: Not on file  Transportation Needs: No Transportation Needs (02/09/2022)   PRAPARE - Transportation    Lack of Transportation (Medical): No    Lack of Transportation (Non-Medical): No  Physical Activity: Not on file  Stress: Not on file  Social Connections: Not on file     Family History: The patient's family history includes Alcohol abuse in his father and paternal grandfather; Aneurysm in his father; Cancer in his maternal grandmother and paternal grandmother; Diabetes in his maternal grandfather and paternal grandmother; Hypertension in his father and mother; Parkinsonism in his maternal grandfather.  ROS:   Please see the history of present  illness.     All other systems reviewed and are negative.  EKGs/Labs/Other Studies Reviewed:    The following studies were reviewed today:   EKG:   02/08/2022: Atrial fibrillation, PVCs, rate 99 12/31/21: Afib, PVCs, rate 96 11/22/2021: Atrial fibrillation, low voltage, Q waves V1-4   Recent Labs: 09/01/2021: B Natriuretic Peptide 107.6 11/22/2021: ALT 25 12/31/2021: Platelets 312 01/12/2022: Hemoglobin 17.0 02/08/2022: Magnesium 2.1 02/22/2022: BUN 17; Creatinine, Ser 1.21; Potassium 4.7; Sodium 139  Recent Lipid Panel    Component Value Date/Time   CHOL 155 11/22/2021 1052   TRIG 73 11/22/2021 1052   HDL 55 11/22/2021 1052   CHOLHDL 2.8 11/22/2021 1052   CHOLHDL 3 07/16/2013 1412   VLDL 16.0 07/16/2013 1412   LDLCALC 86 11/22/2021 1052    Physical Exam:    VS:  BP 120/80 (BP Location: Left Arm, Patient Position: Sitting, Cuff Size: Large)   Pulse 99   Ht '5\' 10"'$  (1.778 m)   Wt (!) 309 lb 9.6 oz (140.4 kg)   SpO2 96%   BMI 44.42 kg/m     Wt Readings from Last 3 Encounters:  02/24/22 (!) 309 lb 9.6 oz (140.4 kg)  02/08/22 (!) 312 lb (141.5 kg)  01/12/22 (!) 304 lb (137.9 kg)     GEN:  Well nourished, well developed in no acute distress HEENT: Normal NECK: No JVD; No carotid bruits CARDIAC: RRR, no murmurs, rubs, gallops RESPIRATORY:  Clear to auscultation without rales, wheezing or rhonchi  ABDOMEN: Soft, non-tender, non-distended MUSCULOSKELETAL:  No edema; No deformity  SKIN: Warm and dry NEUROLOGIC:  Alert and oriented x 3 PSYCHIATRIC:  Normal affect   ASSESSMENT:    1. Chronic combined systolic and diastolic heart failure ([place])   2. NSVT (nonsustained ventricular tachycardia) (H[facility])   3. Permanent atrial fibrillation ([place])   4. Essential hypertension   5. Hyperlipidemia, unspecified hyperlipidemia type       PLAN:    Chronic combined heart failure: EF 35 to 40% on echo 08/2021.  Previously followed with advanced heart failure at [facility].  [place]  on 01/12/2022 showed normal coronary arteries, mildly elevated filling pressures (RA 10, RV 46/1, PA 36/24/28, PCWP 21, LVEDP 17, CI 2.0).  Echocardiogram 02/22/2022 showed EF 35 to 40%, normal RV function, severe right atrial enlargement, moderate left atrial enlargement, moderate to severe mitral regurgitation.  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 -CMR to workup etiology of nonischemic cardiomyopathy -Continue Entresto 24-26 mg twice daily.   -Continue spironolactone 25 mg daily -Continue toprol XL 100 mg daily -Continue Jardiance 10 mg daily -Continue torsemide 10 mg daily.  Appears euvolemic.     Atrial fibrillation: S/p prior ablation. Likely permanent.  Zio patch 12/2021 showed 100% A-fib burden with average rate 100 bpm.  Previously on amiodarone but was discontinued as likely chronic A-fib. 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  However suspect permanent A-fib at this point.  Refer to EP for opinion.  Rates appear reasonably controlled but if having issues with rate control, would likely need AV nodal ablation   Frequent PVCs/NSVT:  Zio patch x 3 days on 12/22/2021 showed 11 episodes of NSVT with longest lasting 14 beats, 100% A-fib burden with average rate 100 bpm, frequent PVCs (9.3% of beats). -Continue toprol XL 100 mg daily -Referred to EP  Hypertension: Continue metoprolol and spironolactone and entresto.  Appears controlled  OSA: reports compliance with CPAP  T2DM: A1c 7.2% on 09/01/2021.  On Jardiance.  Managed by PCP  Hyperlipidemia: LDL 86 on 11/22/2021, started on atorvastatin 20 mg daily   RTC in 3 months   Medication Adjustments/Labs and Tests Ordered: Current medicines are reviewed at length with the patient today.  Concerns regarding medicines are outlined above.  Orders Placed This Encounter  Procedures   MR CARDIAC MORPHOLOGY W WO  CONTRAST   No orders of the defined types were placed in this encounter.   Patient Instructions  Medication Instructions:  Your physician recommends that you continue on your current medications as directed. Please refer to the Current Medication list given to you today.  *If you need a refill on your cardiac medications before your next appointment, please call your pharmacy*  Testing/Procedures: Your physician has requested that you have a cardiac MRI. Cardiac MRI uses a computer to create images of your heart as its beating, producing both still and moving pictures of your heart and major blood vessels. For further information please visit [email or web address] Please follow the instruction sheet given to you today for more information.  Follow-Up: At [facility], you and your health needs are our priority.  As part of our continuing mission to provide you with exceptional heart care, we have created designated Provider Care Teams.  These Care Teams include your primary Cardiologist (physician) and Advanced Practice Providers (APPs -  Physician Assistants and Nurse Practitioners) who all work together to provide you with the care you need, when you need it.  We recommend signing up for the patient portal called "MyChart".  Sign up information is provided on this After Visit Summary.  MyChart is used to connect with patients for Virtual Visits (Telemedicine).  Patients are able to view lab/test results, encounter notes, upcoming appointments, etc.  Non-urgent messages can be sent to your provider as well.   To learn more about what you can do with MyChart, go to [email or web address].    Your next appointment:   3 month(s)  Provider:   Donato Heinz, MD     Other Instructions   You are scheduled for Cardiac MRI on ______________. Please arrive for your appointment at ______________ ( arrive 30-45 minutes prior to test start time). ?  [facility] [address] [place], [place] [address] [phone] Please take advantage of the free valet parking  available at the MAIN entrance (A entrance).  Proceed to the [facility] Radiology Department (First Floor) for check-in.   [facility] [place], [place] [address] [phone] Please take advantage of the free valet parking available at the MAIN entrance. Proceed to [facility] registration for check-in (first floor).  Magnetic resonance imaging (MRI) is a painless test that produces images of the inside of the body without using Xrays.  During an MRI, strong magnets and radio waves work together in a [employer], political party to form detailed images.   MRI images may provide more details about a medical condition than X-rays, CT scans, and ultrasounds can provide.  You may be given earphones to listen for instructions.  You may eat a light breakfast and take medications as ordered with the exception of furosemide, hydrochlorothiazide, or spironolactone(fluid pill, other). Please avoid stimulants for 12 hr prior to test. (Ie. Caffeine, nicotine, chocolate, or antihistamine medications)  If a contrast material will be used, an IV will be inserted into one of your veins. Contrast material will be injected into your IV. It will leave your body through your urine within a day. You may be told to drink plenty of fluids to help flush the contrast material out of your system.  You will be asked to remove all metal, including: Watch, jewelry, and other metal objects including hearing aids, hair pieces and dentures. Also wearable glucose monitoring systems (ie. Freestyle Libre and Omnipods) (Braces and fillings normally are not a problem.)   TEST WILL TAKE APPROXIMATELY 1 HOUR  PLEASE NOTIFY SCHEDULING AT LEAST 24 HOURS IN ADVANCE IF YOU ARE UNABLE TO KEEP YOUR APPOINTMENT. [phone]  Please call Marchia Bond, cardiac imaging nurse navigator with any  questions/concerns. Marchia Bond RN Navigator Cardiac Imaging Gordy Clement RN Navigator Cardiac Imaging [facility] [phone] Office      Signed, Donato Heinz, MD  02/24/2022 4:26 PM    [place]

## 2022-02-25 ENCOUNTER — Ambulatory Visit: Payer: Commercial Managed Care - HMO | Attending: Cardiology | Admitting: Cardiology

## 2022-02-25 ENCOUNTER — Encounter: Payer: Self-pay | Admitting: Cardiology

## 2022-02-25 VITALS — BP 136/84 | HR 82 | Ht 70.0 in | Wt 310.0 lb

## 2022-02-25 DIAGNOSIS — I4821 Permanent atrial fibrillation: Secondary | ICD-10-CM | POA: Diagnosis not present

## 2022-02-25 DIAGNOSIS — I4729 Other ventricular tachycardia: Secondary | ICD-10-CM | POA: Diagnosis not present

## 2022-02-25 DIAGNOSIS — I5042 Chronic combined systolic (congestive) and diastolic (congestive) heart failure: Secondary | ICD-10-CM | POA: Diagnosis not present

## 2022-02-25 NOTE — Progress Notes (Signed)
Electrophysiology Office Note:    Date:  02/25/2022   ID:  William Fischer, DOB 08-Oct-1965, MRN [id number]  PCP:  Forrestine Him, [facility] Cardiologist:  Donato Heinz, MD  [facility] Electrophysiologist:  Vickie Epley, MD   Referring MD: Donato Heinz*   Chief Complaint: A-fib and NSVT  History of Present Illness:    William Fischer is a 57 y.o. male who presents for an evaluation of A-fib and NSVT at the request of Dr. Gardiner Rhyme. Their medical history includes heart failure, atrial fibrillation, hypertension, colonic perforation in 2016 post hemicolectomy, OSA.  He was hospitalized in the fall 2023 with decompensated systolic heart failure.  He was diuresed but ultimately left AMA.  He was previously followed by Dr. Rayann Heman and was felt to have permanent atrial fibrillation.  He was then seen at [facility] currently had an A-fib ablation in 2021 and was started on amiodarone.  He maintained sinus rhythm for 4 to 5 months.  Zio patch in December 2023 showed 100% A-fib, significant atrial enlargement and moderate to severe mitral regurgitation.  Today, he is feeling overall well. He reports that a cardioversion and ablation were previously unsuccessful. He has been taking medication to manage his A-fib. He monitors his blood pressure and reports high readings, around [id number] systolic.   He is complaint with metoprolol and xarelto.   He denies any palpitations, chest pain, shortness of breath, or peripheral edema. No lightheadedness, headaches, syncope, orthopnea, or PND.  Past Medical History:  Diagnosis Date   Allergy to IVP dye    Atrial fibrillation ([place])    admx 11/13 with acute sCHF in setting of RVR  => a. failed DCCV x 2; b. Pradaxa started;  c. failed sotalol   Chicken pox as a child   Chronic anticoagulation    Pradaxa   Chronic systolic heart failure ([place])    a. echo 11/13: Ef 40-45%, diff HK, mod MR, mod LAE, mild RVE, mod RAE, small  effusion;   b. 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   Migraine 06/29/2011   Obesity 06/29/2011   Snoring    patient needs sleep study - has declined    Past Surgical History:  Procedure Laterality Date   CARDIOVERSION  11/30/2011   Procedure: CARDIOVERSION;  Surgeon: Thayer Headings, MD;  Location: [facility] ENDOSCOPY;  Service: Cardiovascular;  Laterality: N/A;   CARDIOVERSION  12/03/2011   Procedure: CARDIOVERSION;  Surgeon: Jolaine Artist, MD;  Location: [facility] OR;  Service: Cardiovascular;  Laterality: N/A;   COLECTOMY  12/07/2015   partial   COLON SURGERY     COLOSTOMY REVERSAL  12/07/2015   COLOSTOMY REVERSAL N/A 12/07/2015   Procedure: OPEN REVERSAL OF COLOSTOMY WITH PARTIAL SIGMOID COLECTOMY;  Surgeon: Leighton Ruff, MD;  Location: [place] OR;  Service: General;  Laterality: N/A;   [place]  12/07/2015   INCISIONAL HERNIA REPAIR N/A 12/07/2015   Procedure: [place];  Surgeon: Leighton Ruff, MD;  Location: [place] OR;  Service: General;  Laterality: N/A;   IR GENERIC HISTORICAL  08/11/2015   IR RADIOLOGIST EVAL & MGMT 08/11/2015 Markus Daft, MD [facility] INTERV RAD   IR GENERIC HISTORICAL  11/18/2015   IR CATHETER TUBE CHANGE 11/18/2015 Corrie Mckusick, DO [facility]   IR GENERIC HISTORICAL  10/15/2015  IR RADIOLOGIST EVAL & MGMT 10/15/2015 Jacqulynn Cadet, MD [facility] INTERV RAD   IR GENERIC HISTORICAL  01/27/2016   IR RADIOLOGIST EVAL & MGMT 01/27/2016 Ascencion Dike, PA-C [facility] INTERV RAD   IR GENERIC HISTORICAL  01/28/2016   IR CATHETER TUBE CHANGE 01/28/2016 Markus Daft, MD [facility]   IR GENERIC HISTORICAL  02/17/2016   IR RADIOLOGIST EVAL & MGMT 02/17/2016 Ascencion Dike, PA-C [facility] INTERV RAD   LAPAROTOMY N/A 01/17/2015   Procedure: EXPLORATORY LAPAROTOMY WITH LEFT COLECTOMY AND COLOSTOMY;  Surgeon: Rolm Bookbinder, MD;  Location: [place];  Service: General;  Laterality: N/A;   [place]  11/2015   RIGHT/LEFT HEART CATH AND CORONARY ANGIOGRAPHY N/A 01/12/2022   Procedure: RIGHT/LEFT HEART CATH AND CORONARY ANGIOGRAPHY;  Surgeon: Sherren Mocha, MD;  Location: [place] CV LAB;  Service: Cardiovascular;  Laterality: N/A;   TEE WITHOUT CARDIOVERSION  11/30/2011   Procedure: TRANSESOPHAGEAL ECHOCARDIOGRAM (TEE);  Surgeon: Thayer Headings, MD;  Location: [facility] ENDOSCOPY;  Service: Cardiovascular;  Laterality: N/A;   TONSILLECTOMY      Current Medications: Current Meds  Medication Sig   acetaminophen (TYLENOL) 500 MG tablet Take 1,000 mg by mouth every 6 (six) hours as needed (pain).   b complex vitamins capsule Take 1 capsule by mouth daily.   dapagliflozin propanediol (FARXIGA) 10 MG TABS tablet Take 1 tablet (10 mg total) by mouth daily.   metoprolol succinate (TOPROL-XL) 50 MG 24 hr tablet Take 2 tablets (100 mg total) by mouth daily. Take with or immediately following a meal.   metoprolol tartrate (LOPRESSOR) 100 MG tablet Take 100 mg by mouth 2 (two) times daily.   Multiple Vitamins-Minerals (ONE-A-DAY 50 PLUS PO) Take 1 capsule by mouth daily in the afternoon.   predniSONE (DELTASONE) 50 MG tablet Take 1 tablet 13 hours prior to procedure, 1 tablet 7 hours prior to procedure, and 1 tablet (with Benedryl 50 mg) prior to going to the hospital for procedure   rivaroxaban (XARELTO) 20 MG TABS tablet TAKE 1 TABLET BY MOUTH DAILY WITH SUPPER.   rosuvastatin (CRESTOR) 20 MG tablet Take 1 tablet (20 mg total) by mouth daily.   sacubitril-valsartan (ENTRESTO) 24-26 MG Take 1 tablet by mouth 2 (two) times daily.   Semaglutide 3 MG TABS Take 3 mg by mouth daily in the afternoon.   spironolactone (ALDACTONE) 25 MG tablet Take 1 tablet (25 mg total) by mouth daily.   torsemide (DEMADEX) 10 MG tablet Take 1 tablet (10 mg total) by mouth daily.     Allergies:   Contrast media [iodinated contrast  media] and Tramadol   Social History   Socioeconomic History   Marital status: Divorced    Spouse name: Not on file   Number of children: Not on file   Years of education: Not on file   Highest education level: Not on file  Occupational History   Not on file  Tobacco Use   Smoking status: Former    Packs/day: 1.00    Years: 20.00    Total pack years: 20.00    Types: Cigarettes    Quit date: 07/10/2014    Years since quitting: 7.6   Smokeless tobacco: Never  Substance and Sexual Activity   Alcohol use: No    Alcohol/week: 0.0 standard drinks of alcohol   Drug use: No   Sexual activity: Never  Other Topics Concern   Not on file  Social History Narrative   Not on file   Social Determinants  of Health   Financial Resource Strain: Medium Risk (02/09/2022)   Overall Financial Resource Strain (CARDIA)    Difficulty of Paying Living Expenses: Somewhat hard  Food Insecurity: Not on file  Transportation Needs: No Transportation Needs (02/09/2022)   PRAPARE - Transportation    Lack of Transportation (Medical): No    Lack of Transportation (Non-Medical): No  Physical Activity: Not on file  Stress: Not on file  Social Connections: Not on file     Family History: The patient's family history includes Alcohol abuse in his father and paternal grandfather; Aneurysm in his father; Cancer in his maternal grandmother and paternal grandmother; Diabetes in his maternal grandfather and paternal grandmother; Hypertension in his father and mother; Parkinsonism in his maternal grandfather.  ROS:   Please see the history of present illness.    All other systems reviewed and are negative.  EKGs/Labs/Other Studies Reviewed:    The following studies were reviewed today:  December 23, 2021 ZIO monitor 100% A-fib, average rate 100 bpm     Recent Labs: 09/01/2021: B Natriuretic Peptide 107.6 11/22/2021: ALT 25 12/31/2021: Platelets 312 01/12/2022: Hemoglobin 17.0 02/08/2022: Magnesium  2.1 02/22/2022: BUN 17; Creatinine, Ser 1.21; Potassium 4.7; Sodium 139  Recent Lipid Panel    Component Value Date/Time   CHOL 155 11/22/2021 1052   TRIG 73 11/22/2021 1052   HDL 55 11/22/2021 1052   CHOLHDL 2.8 11/22/2021 1052   CHOLHDL 3 07/16/2013 1412   VLDL 16.0 07/16/2013 1412   LDLCALC 86 11/22/2021 1052    Physical Exam:    VS:  BP 136/84   Pulse 82   Ht 5' 10"$  (1.778 m)   Wt (!) 310 lb (140.6 kg)   SpO2 98%   BMI 44.48 kg/m     Wt Readings from Last 3 Encounters:  02/25/22 (!) 310 lb (140.6 kg)  02/24/22 (!) 309 lb 9.6 oz (140.4 kg)  02/08/22 (!) 312 lb (141.5 kg)     GEN:  Well nourished, well developed in no acute distress. obese CARDIAC: Irregularly irregular RESPIRATORY:  Clear to auscultation without rales, wheezing or rhonchi       ASSESSMENT:    1. Permanent atrial fibrillation ([place])   2. NSVT (nonsustained ventricular tachycardia) ([place])   3. Chronic combined systolic and diastolic heart failure (HCC)    PLAN:    In order of problems listed above:  #Permanent atrial fibrillation Discussed treatment options with the patient in detail during today's visit.  His rates are relatively well-controlled.  I would favor using a rate control strategy for now rather than proceeding with pacemaker implant and AV nodal ablation given his relatively young age.  If we are ultimately unable to achieve adequate rate control, could revisit this question, especially if meets ICD criteria after cardiac MRI.  Continue Xarelto for stroke prophylaxis.  Continue metoprolol succinate 100 mg by mouth once daily.  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  Continue beta-blocker as above.  #Chronic systolic heart failure NYHA class II-III.  EF 35% on February 22, 2022 echo.  Cardiac MRI pending.  If EF is persistently reduced despite guideline directed medical therapy, could discuss CRT-D implant followed by AV nodal ablation to provide protection against sudden cardiac death  and facilitate better rate control with his atrial fibrillation.  Follow-up with me in 3 months to discuss MRI results and consider CRT-D implant +/- AV nodal ablation.   Medication Adjustments/Labs and Tests Ordered: Current medicines are reviewed at length with the patient today.  Concerns regarding medicines are outlined above.  No orders of the defined types were placed in this encounter.  No orders of the defined types were placed in this encounter.  I,Rachel Rivera,acting as a scribe for Vickie Epley, MD.,have documented all relevant documentation on the behalf of Vickie Epley, MD,as directed by  Vickie Epley, MD while in the presence of Vickie Epley, MD.  I, Vickie Epley, MD, have reviewed all documentation for this visit. The documentation on 02/25/22 for the exam, diagnosis, procedures, and orders are all accurate and complete.  Signed, Hilton Cork. Quentin Ore, MD, [facility], [facility] 02/25/2022 12:36 PM    Electrophysiology [facility]

## 2022-02-25 NOTE — Progress Notes (Deleted)
Electrophysiology Office Note:    Date:  02/25/2022   ID:  William Fischer, DOB 1965-10-15, MRN [id number]  PCP:  Forrestine Him, [facility] Cardiologist:  Donato Heinz, MD  [facility] Electrophysiologist:  Vickie Epley, MD   Referring MD: Donato Heinz*   Chief Complaint: A-fib and NSVT  History of Present Illness:    William Fischer is a 57 y.o. male who presents for an evaluation of A-fib and NSVT at the request of Dr. Gardiner Rhyme. Their medical history includes heart failure, atrial fibrillation, hypertension, colonic perforation in 2016 post hemicolectomy, OSA.  He was hospitalized in the fall 2023 with decompensated systolic heart failure.  He was diuresed but ultimately left AMA.  He was previously followed by Dr. Rayann Heman and was felt to have permanent atrial fibrillation.  He was then seen at [facility] [place] currently had an A-fib ablation in 2021 and was started on amiodarone.  He maintained sinus rhythm for 4 to 5 months.  Zio patch in December 2023 showed 100% A-fib, significant atrial enlargement and moderate to severe mitral regurgitation.     Past Medical History:  Diagnosis Date   Allergy to IVP dye    Atrial fibrillation ([place])    admx 11/13 with acute sCHF in setting of RVR  => a. failed DCCV x 2; b. Pradaxa started;  c. failed sotalol   Chicken pox as a child   Chronic anticoagulation    Pradaxa   Chronic systolic heart failure ([place])    a. echo 11/13: Ef 40-45%, diff HK, mod MR, mod LAE, mild RVE, mod RAE, small effusion;   b. 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   Migraine 06/29/2011   Obesity 06/29/2011   Snoring    patient needs sleep study - has declined    Past Surgical History:  Procedure Laterality Date   CARDIOVERSION  11/30/2011   Procedure: CARDIOVERSION;  Surgeon: Thayer Headings, MD;  Location: [facility] ENDOSCOPY;   Service: Cardiovascular;  Laterality: N/A;   CARDIOVERSION  12/03/2011   Procedure: CARDIOVERSION;  Surgeon: Jolaine Artist, MD;  Location: [facility] OR;  Service: Cardiovascular;  Laterality: N/A;   COLECTOMY  12/07/2015   partial   COLON SURGERY     COLOSTOMY REVERSAL  12/07/2015   COLOSTOMY REVERSAL N/A 12/07/2015   Procedure: OPEN REVERSAL OF COLOSTOMY WITH PARTIAL SIGMOID COLECTOMY;  Surgeon: Leighton Ruff, MD;  Location: [place];  Service: General;  Laterality: N/A;   [place]  12/07/2015   INCISIONAL HERNIA REPAIR N/A 12/07/2015   Procedure: INCISIONAL HERNIA REPAIR;  Surgeon: Leighton Ruff, MD;  Location: [place];  Service: General;  Laterality: N/A;   IR GENERIC HISTORICAL  08/11/2015   IR RADIOLOGIST EVAL & MGMT 08/11/2015 Markus Daft, MD [facility] INTERV RAD   IR GENERIC HISTORICAL  11/18/2015   IR CATHETER TUBE CHANGE 11/18/2015 Corrie Mckusick, DO [facility]   IR GENERIC HISTORICAL  10/15/2015   IR RADIOLOGIST EVAL & MGMT 10/15/2015 Jacqulynn Cadet, MD [facility] INTERV RAD   IR GENERIC HISTORICAL  01/27/2016   IR RADIOLOGIST EVAL & MGMT 01/27/2016 Ascencion Dike, PA-C [facility] INTERV RAD   IR GENERIC HISTORICAL  01/28/2016   IR CATHETER TUBE CHANGE 01/28/2016 Markus Daft, MD [facility]   IR GENERIC HISTORICAL  02/17/2016   IR RADIOLOGIST EVAL &  MGMT 02/17/2016 Ascencion Dike, PA-C [facility] INTERV RAD   LAPAROTOMY N/A 01/17/2015   Procedure: EXPLORATORY LAPAROTOMY WITH LEFT COLECTOMY AND COLOSTOMY;  Surgeon: Rolm Bookbinder, MD;  Location: [place];  Service: General;  Laterality: N/A;   [place]  11/2015   RIGHT/LEFT HEART CATH AND CORONARY ANGIOGRAPHY N/A 01/12/2022   Procedure: RIGHT/LEFT HEART CATH AND CORONARY ANGIOGRAPHY;  Surgeon: Sherren Mocha, MD;  Location: [place] CV LAB;  Service: Cardiovascular;  Laterality: N/A;   TEE WITHOUT CARDIOVERSION  11/30/2011   Procedure: TRANSESOPHAGEAL ECHOCARDIOGRAM (TEE);  Surgeon:  Thayer Headings, MD;  Location: [facility] ENDOSCOPY;  Service: Cardiovascular;  Laterality: N/A;   TONSILLECTOMY      Current Medications: No outpatient medications have been marked as taking for the 02/25/22 encounter (Appointment) with Vickie Epley, MD.     Allergies:   Contrast media [iodinated contrast media] and Tramadol   Social History   Socioeconomic History   Marital status: Divorced    Spouse name: Not on file   Number of children: Not on file   Years of education: Not on file   Highest education level: Not on file  Occupational History   Not on file  Tobacco Use   Smoking status: Former    Packs/day: 1.00    Years: 20.00    Total pack years: 20.00    Types: Cigarettes    Quit date: 07/10/2014    Years since quitting: 7.6   Smokeless tobacco: Never  Substance and Sexual Activity   Alcohol use: No    Alcohol/week: 0.0 standard drinks of alcohol   Drug use: No   Sexual activity: Never  Other Topics Concern   Not on file  Social History Narrative   Not on file   Social Determinants of Health   Financial Resource Strain: Medium Risk (02/09/2022)   Overall Financial Resource Strain (CARDIA)    Difficulty of Paying Living Expenses: Somewhat hard  Food Insecurity: Not on file  Transportation Needs: No Transportation Needs (02/09/2022)   PRAPARE - Hydrologist (Medical): No    Lack of Transportation (Non-Medical): No  Physical Activity: Not on file  Stress: Not on file  Social Connections: Not on file     Family History: The patient's family history includes Alcohol abuse in his father and paternal grandfather; Aneurysm in his father; Cancer in his maternal grandmother and paternal grandmother; Diabetes in his maternal grandfather and paternal grandmother; Hypertension in his father and mother; Parkinsonism in his maternal grandfather.  ROS:   Please see the history of present illness.    All other systems reviewed and are  negative.  EKGs/Labs/Other Studies Reviewed:    The following studies were reviewed today:  December 23, 2021 ZIO monitor 100% A-fib, average rate 100 bpm     Recent Labs: 09/01/2021: B Natriuretic Peptide 107.6 11/22/2021: ALT 25 12/31/2021: Platelets 312 01/12/2022: Hemoglobin 17.0 02/08/2022: Magnesium 2.1 02/22/2022: BUN 17; Creatinine, Ser 1.21; Potassium 4.7; Sodium 139  Recent Lipid Panel    Component Value Date/Time   CHOL 155 11/22/2021 1052   TRIG 73 11/22/2021 1052   HDL 55 11/22/2021 1052   CHOLHDL 2.8 11/22/2021 1052   CHOLHDL 3 07/16/2013 1412   VLDL 16.0 07/16/2013 1412   LDLCALC 86 11/22/2021 1052    Physical Exam:    VS:  There were no vitals taken for this visit.    Wt Readings from Last 3 Encounters:  02/24/22 (!) 309 lb 9.6  oz (140.4 kg)  02/08/22 (!) 312 lb (141.5 kg)  01/12/22 (!) 304 lb (137.9 kg)     GEN: *** Well nourished, well developed in no acute distress CARDIAC: ***Irregularly irregular RESPIRATORY:  Clear to auscultation without rales, wheezing or rhonchi       ASSESSMENT:    No diagnosis found. PLAN:    In order of problems listed above:  #Permanent atrial fibrillation Discussed treatment options with the patient in detail during today's visit.  His rates are relatively well-controlled.  I would favor using a rate control strategy for now rather than proceeding with pacemaker implant and AV nodal ablation given his relatively young age.  If we are ultimately unable to achieve adequate rate control, could revisit this question, especially if meets ICD criteria after cardiac MRI.  Continue Xarelto for stroke prophylaxis.  Continue metoprolol succinate 100 mg by mouth once daily.  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  Continue beta-blocker as above.  #Chronic systolic heart failure NYHA class II-III.  EF 35% on February 22, 2022 echo.  Cardiac MRI pending.  If EF is persistently reduced despite guideline directed medical  therapy, could discuss CRT-D implant followed by AV nodal ablation to provide protection against sudden cardiac death and facilitate better rate control with his atrial fibrillation.  Follow-up with me in 3 months to discuss MRI results and consider CRT-D implant +/- AV nodal ablation.    Medication Adjustments/Labs and Tests Ordered: Current medicines are reviewed at length with the patient today.  Concerns regarding medicines are outlined above.  No orders of the defined types were placed in this encounter.  No orders of the defined types were placed in this encounter.    Signed, Hilton Cork. Quentin Ore, MD, [facility] 02/25/2022 5:13 AM    Electrophysiology [facility]

## 2022-02-25 NOTE — Patient Instructions (Signed)
Medication Instructions:  Your physician recommends that you continue on your current medications as directed. Please refer to the Current Medication list given to you today.  *If you need a refill on your cardiac medications before your next appointment, please call your pharmacy*  Follow-Up: At [facility], you and your health needs are our priority.  As part of our continuing mission to provide you with exceptional heart care, we have created designated Provider Care Teams.  These Care Teams include your primary Cardiologist (physician) and Advanced Practice Providers (APPs -  Physician Assistants and Nurse Practitioners) who all work together to provide you with the care you need, when you need it.  Your next appointment:   4 month(s)  Provider:   You may see Vickie Epley, MD or one of the following Advanced Practice Providers on your designated Care Team:   Tommye Standard, [place] Beryle Beams" [place], [place], NP

## 2022-05-27 ENCOUNTER — Telehealth (HOSPITAL_COMMUNITY): Payer: Self-pay | Admitting: *Deleted

## 2022-05-27 ENCOUNTER — Telehealth: Payer: Self-pay | Admitting: Cardiology

## 2022-05-27 NOTE — Telephone Encounter (Signed)
Caller reported authorization:  Dr. Little Ishikawa,  [facility]   Code [id number], [phone]   Authorization good for 1 day of service 05/25/22 - 11/21/22  Auth #  [id number]  [facility], [phone], Ref# [id number]

## 2022-05-27 NOTE — Telephone Encounter (Signed)
Attempted to call patient regarding upcoming cardiac MRI appointment. Left message on voicemail with name and callback number  Meghann Landing RN Navigator Cardiac Imaging [facility] [phone] Office [phone] Cell  

## 2022-05-30 ENCOUNTER — Ambulatory Visit (HOSPITAL_COMMUNITY)
Admission: RE | Admit: 2022-05-30 | Discharge: 2022-05-30 | Disposition: A | Discharge status: Home / Self Care | Payer: 59 | Source: Ambulatory Visit | Attending: Cardiology | Admitting: Cardiology

## 2022-05-30 ENCOUNTER — Other Ambulatory Visit: Payer: Self-pay | Admitting: Cardiology

## 2022-05-30 DIAGNOSIS — I5042 Chronic combined systolic (congestive) and diastolic (congestive) heart failure: Secondary | ICD-10-CM | POA: Insufficient documentation

## 2022-05-30 MED ORDER — GADOBUTROL 1 MMOL/ML IV SOLN
10.0000 mL | Freq: Once | INTRAVENOUS | Status: AC | PRN
  Administered 2022-05-30: 10 mL via INTRAVENOUS

## 2022-06-10 NOTE — Progress Notes (Unsigned)
Cardiology Clinic Note   Patient Name: William Fischer Date of Encounter: 06/14/2022  Primary Care Provider:  Mendel Ryder, PA-C Primary Cardiologist:  Little Ishikawa, MD  Patient Profile    William Fischer 57 year old male presents to the clinic today for follow-up evaluation of his chronic systolic CHF and atrial fibrillation.  Past Medical History    Past Medical History:  Diagnosis Date   Allergy to IVP dye    Atrial fibrillation (HCC)    admx 11/13 with acute sCHF in setting of RVR  => a. failed DCCV x 2; b. Pradaxa started;  c. failed sotalol   Chicken pox as a child   Chronic anticoagulation    Pradaxa   Chronic systolic heart failure (HCC)    a. echo 11/13: Ef 40-45%, diff HK, mod MR, mod LAE, mild RVE, mod RAE, small effusion;   b. 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   Migraine 06/29/2011   Obesity 06/29/2011   Snoring    patient needs sleep study - has declined   Past Surgical History:  Procedure Laterality Date   CARDIOVERSION  11/30/2011   Procedure: CARDIOVERSION;  Surgeon: Vesta Mixer, MD;  Location: [facility] ENDOSCOPY;  Service: Cardiovascular;  Laterality: N/A;   CARDIOVERSION  12/03/2011   Procedure: CARDIOVERSION;  Surgeon: Dolores Patty, MD;  Location: [facility] OR;  Service: Cardiovascular;  Laterality: N/A;   COLECTOMY  12/07/2015   partial   COLON SURGERY     COLOSTOMY REVERSAL  12/07/2015   COLOSTOMY REVERSAL N/A 12/07/2015   Procedure: OPEN REVERSAL OF COLOSTOMY WITH PARTIAL SIGMOID COLECTOMY;  Surgeon: Romie Levee, MD;  Location: [facility] OR;  Service: General;  Laterality: N/A;   HERNIA REPAIR     INCISIONAL HERNIA REPAIR  12/07/2015   INCISIONAL HERNIA REPAIR N/A 12/07/2015   Procedure: INCISIONAL HERNIA REPAIR;  Surgeon: Romie Levee, MD;  Location: [facility] OR;  Service: General;  Laterality: N/A;   IR GENERIC HISTORICAL   08/11/2015   IR RADIOLOGIST EVAL & MGMT 08/11/2015 Richarda Overlie, MD [facility] INTERV RAD   IR GENERIC HISTORICAL  11/18/2015   IR CATHETER TUBE CHANGE 11/18/2015 Gilmer Mor, DO [facility]   IR GENERIC HISTORICAL  10/15/2015   IR RADIOLOGIST EVAL & MGMT 10/15/2015 Malachy Moan, MD [facility] INTER[facility] RAD   IR GENERIC HISTORICAL  01/27/2016   IR RADIOLOGIST EVAL & MGMT 01/27/2016 Brayton El, PA-C [facility] INTERV RAD   IR GENERIC HISTORICAL  01/28/2016   IR CATHETER TUBE CHANGE 01/28/2016 Richarda Overlie, MD [facility]   IR GENERIC HISTORICAL  02/17/2016   IR RADIOLOGIST EVAL & MGMT 02/17/2016 Brayton El, PA-C [facility] INTER[facility] RAD   LAPAROTOMY N/A 01/17/2015   Procedure: EXPLORATORY LAPAROTOMY WITH LEFT COLECTOMY AND COLOSTOMY;  Surgeon: Emelia Loron, MD;  Location: [facility] OR;  Service: General;  Laterality: N/A;   PERIPHERALLY INSERTED CENTRAL CATHETER INSERTION  11/2015   RIGHT/LEFT HEART CATH AND CORONARY ANGIOGRAPHY N/A 01/12/2022   Procedure: RIGHT/LEFT HEART CATH AND CORONARY ANGIOGRAPHY;  Surgeon: Tonny Bollman, MD;  Location: [facility] INVASIVE CV LAB;  Service: Cardiovascular;  Laterality: N/A;   TEE WITHOUT CARDIOVERSION  11/30/2011   Procedure: TRANSESOPHAGEAL ECHOCARDIOGRAM (TEE);  Surgeon: Vesta Mixer, MD;  Location: [facility] ENDOSCOPY;  Service: Cardiovascular;  Laterality: N/A;   TONSILLECTOMY      Allergies  Allergies  Allergen Reactions   Contrast  Media [Iodinated Contrast Media] Rash    a diffuse macular rash after CTA chest  Pt was premedicated with 125mg  IV Solumedrol, 50mg  IV Benadryl 1 hr prior to CTexam, and tolerated procedure without any difficulties. 11/28/11 Also same pre med protocol observed on 02/22/15 and pt tolerated the procedure well   Tramadol     History of Present Illness    William Fischer has a PMH of atrial fibrillation, chronic systolic CHF, nonischemic cardiomyopathy, pleural effusion, bowel perforation, AKI, morbid obesity, pressure ulcer, colostomy status post  hemicolectomy 2016, sepsis, prediabetes, vertigo, and syncope and collapse.  He was previously followed by Dr. Johney Frame.  He underwent ablation at [facility] for atrial fibrillation in 2021.  During that time he was started on amiodarone.  He maintained sinus rhythm for 4-5 months.  He wore a cardiac event monitor 12/23 which showed 100% atrial fibrillation with significant atrial enlargement and moderate-severe mitral regurgitation.  He was seen in follow-up by Dr. Lalla Brothers on 02/25/2022.  During that time he reported he was feeling well.  He indicated that he had previous unsuccessful cardioversion and ablation procedures.  He was taking medication for his atrial fibrillation.  He reported elevated blood pressures in the 160 systolic range.  He reported compliance with metoprolol and Xarelto.  Rate control was felt to be the best course of treatment.  Pacemaker implant and AV nodal ablation were discussed but not felt to be appropriate at this time due to his young age.  His Xarelto and metoprolol were continued.  He was seen in follow-up by Dr. Bjorn Pippin on 02/24/2022.  His cardiac event monitor 12/22/2021 was reviewed which showed 11 episodes of NSVT and 100% A-fib burden.  He reported that he had been doing okay.  He denied chest pain and dyspnea as well as lightheadedness and syncope.  He denied lower extremity swelling and bleeding issues.  He was compliant with Xarelto.  Cardiac MRI was ordered.  Details below.  Cardiac MRI showed stable EF at 33%, subendocardial LGE in apical inferior wall suggesting small infarct.  FDG-PET /CT recommended for evaluation for cardiac sarcoidosis.  He presents to the clinic today for follow-up evaluation and states he feels he would like to proceed with recommendations from EP.  We reviewed his cardiac MRI and he expressed understanding.  We reviewed recommendations for cardiac PET scan to evaluate for cardiac sarcoid.  He expressed understanding.  We reviewed his  upcoming appointment with Dr. Lalla Brothers.  He remains stable from a cardiac standpoint.  He denies chest pain increase shortness of breath.   Will plan follow-up with Dr. Bjorn Pippin after cardiac PET/CT.   Home Medications    Prior to Admission medications   Medication Sig Start Date End Date Taking? Authorizing Provider  acetaminophen (TYLENOL) 500 MG tablet Take 1,000 mg by mouth every 6 (six) hours as needed (pain).    [provider]  b complex vitamins capsule Take 1 capsule by mouth daily.    [provider]  dapagliflozin propanediol (FARXIGA) 10 MG TABS tablet Take 1 tablet (10 mg total) by mouth daily. 11/22/21   Little Ishikawa, MD  metoprolol succinate (TOPROL-XL) 50 MG 24 hr tablet Take 2 tablets (100 mg total) by mouth daily. Take with or immediately following a meal. 12/23/21   Little Ishikawa, MD  metoprolol tartrate (LOPRESSOR) 100 MG tablet Take 100 mg by mouth 2 (two) times daily. 01/31/22   [provider]  Multiple Vitamins-Minerals (ONE-A-DAY 50 PLUS PO)  Take 1 capsule by mouth daily in the afternoon.    [provider]  predniSONE (DELTASONE) 50 MG tablet Take 1 tablet 13 hours prior to procedure, 1 tablet 7 hours prior to procedure, and 1 tablet (with Benedryl 50 mg) prior to going to the hospital for procedure 12/31/21   Little Ishikawa, MD  rivaroxaban (XARELTO) 20 MG TABS tablet TAKE 1 TABLET BY MOUTH DAILY WITH SUPPER. 01/31/22   Little Ishikawa, MD  rosuvastatin (CRESTOR) 20 MG tablet Take 1 tablet (20 mg total) by mouth daily. 11/23/21 11/18/22  Little Ishikawa, MD  sacubitril-valsartan (ENTRESTO) 24-26 MG Take 1 tablet by mouth 2 (two) times daily. 02/08/22   Little Ishikawa, MD  Semaglutide 3 MG TABS Take 3 mg by mouth daily in the afternoon. 02/22/22   [provider]  spironolactone (ALDACTONE) 25 MG tablet Take 1 tablet (25 mg total) by mouth daily. 09/22/21   Little Ishikawa,  MD  torsemide (DEMADEX) 10 MG tablet Take 1 tablet (10 mg total) by mouth daily. 09/22/21   Little Ishikawa, MD    Family History    Family History  Problem Relation Age of Onset   Hypertension Mother    Hypertension Father    Aneurysm Father    Alcohol abuse Father    Cancer Maternal Grandmother        brain   Diabetes Maternal Grandfather        type 2   Parkinsonism Maternal Grandfather    Cancer Paternal Grandmother        breast   Diabetes Paternal Grandmother    Alcohol abuse Paternal Grandfather    He indicated that his mother is alive. He indicated that his father is deceased. He indicated that both of his sisters are alive. He indicated that his maternal grandmother is deceased. He indicated that his maternal grandfather is deceased. He indicated that his paternal grandmother is deceased. He indicated that his paternal grandfather is deceased. He indicated that both of his sons are alive.  Social History    Social History   Socioeconomic History   Marital status: Divorced    Spouse name: Not on file   Number of children: Not on file   Years of education: Not on file   Highest education level: Not on file  Occupational History   Not on file  Tobacco Use   Smoking status: Former    Packs/day: 1.00    Years: 20.00    Additional pack years: 0.00    Total pack years: 20.00    Types: Cigarettes    Quit date: 07/10/2014    Years since quitting: 7.9   Smokeless tobacco: Never  Substance and Sexual Activity   Alcohol use: No    Alcohol/week: 0.0 standard drinks of alcohol   Drug use: No   Sexual activity: Never  Other Topics Concern   Not on file  Social History Narrative   Not on file   Social Determinants of Health   Financial Resource Strain: Medium Risk (02/09/2022)   Overall Financial Resource Strain (CARDIA)    Difficulty of Paying Living Expenses: Somewhat hard  Food Insecurity: Not on file  Transportation Needs: No Transportation Needs  (02/09/2022)   PRAPARE - Administrator, Civil Service (Medical): No    Lack of Transportation (Non-Medical): No  Physical Activity: Not on file  Stress: Not on file  Social Connections: Not on file  Intimate Partner Violence: Not on file  Review of Systems    General:  No chills, fever, night sweats or weight changes.  Cardiovascular:  No chest pain, dyspnea on exertion, edema, orthopnea, palpitations, paroxysmal nocturnal dyspnea. Dermatological: No rash, lesions/masses Respiratory: No cough, dyspnea Urologic: No hematuria, dysuria Abdominal:   No nausea, vomiting, diarrhea, bright red blood per rectum, melena, or hematemesis Neurologic:  No visual changes, wkns, changes in mental status. All other systems reviewed and are otherwise negative except as noted above.  Physical Exam    VS:  BP 128/82   Pulse 95   Ht 5\' 11"  (1.803 m)   Wt 296 lb 6.4 oz (134.4 kg)   SpO2 93%   BMI 41.34 kg/m  , BMI Body mass index is 41.34 kg/m. GEN: Well nourished, well developed, in no acute distress. HEENT: normal. Neck: Supple, no JVD, carotid bruits, or masses. Cardiac: RRR, no murmurs, rubs, or gallops. No clubbing, cyanosis, edema.  Radials/DP/PT 2+ and equal bilaterally.  Respiratory:  Respirations regular and unlabored, clear to auscultation bilaterally. GI: Soft, nontender, nondistended, BS + x 4. MS: no deformity or atrophy. Skin: warm and dry, no rash. Neuro:  Strength and sensation are intact. Psych: Normal affect.  Accessory Clinical Findings    Recent Labs: 09/01/2021: B Natriuretic Peptide 107.6 11/22/2021: ALT 25 12/31/2021: Platelets 312 01/12/2022: Hemoglobin 17.0 02/08/2022: Magnesium 2.1 02/22/2022: BUN 17; Creatinine, Ser 1.21; Potassium 4.7; Sodium 139   Recent Lipid Panel    Component Value Date/Time   CHOL 155 11/22/2021 1052   TRIG 73 11/22/2021 1052   HDL 55 11/22/2021 1052   CHOLHDL 2.8 11/22/2021 1052   CHOLHDL 3 07/16/2013 1412   VLDL 16.0  07/16/2013 1412   LDLCALC 86 11/22/2021 1052         ECG personally reviewed by me today-none today.   Cardiac MRI 05/30/2022   FINDINGS: Left ventricle:   -Mild dilatation   -Moderate systolic dysfunction. Global hypokinesis with focal area of thinning/akinesis in apical inferior wall   -Normal T2 values.  Elevated ECV (31%)   -Subendocardial LGE in apical inferior wall   -Basal septal midwall LGE   -RV insertion site LGE   LV EF: 33% (Normal 49-79%)   Absolute volumes:   LV EDV: (Normal 95-215 mL)   LV ESV: (Normal 25-85 mL)   LV SV: (Normal 61-145 mL)   CO: 7.5L/min (Normal 3.4-7.8 L/min)   Indexed volumes:   LV EDV: 155mL/sq-m (Normal 50-108 mL/sq-m)   LV ESV: 43mL/sq-m (Normal 11-47 mL/sq-m)   LV SV: 90mL/sq-m (Normal 33-72 mL/sq-m)   CI: 2.9L/min/sq-m (Normal 1.8-4.2 L/min/sq-m)   Right ventricle: Normal size and systolic function   RV EF:  49% (Normal 51-80%)   Absolute volumes:   RV EDV: (Normal 109-217 mL)   RV ESV: (Normal 23-91 mL)   RV SV: (Normal 71-141 mL)   CO: 8.6L/min (Normal 2.8-8.8 L/min)   Indexed volumes:   RV EDV: 18mL/sq-m (Normal 58-109 mL/sq-m)   RV ESV: 11mL/sq-m (Normal 12-46 mL/sq-m)   RV SV: 5mL/sq-m (Normal 38-71 mL/sq-m)   CI: 3.3L/min/sq-m (Normal 1.7-4.2 L/min/sq-m)   Left atrium: Severe enlargement   Right atrium: Moderate enlargement   Mitral valve: Visually appears mild regurgitation, was not quantified   Aortic valve: No regurgitation   Tricuspid valve: Visually appears mild regurgitation, was not quantified   Pulmonic valve: No regurgitation   Aorta: Normal proximal ascending aorta   Pericardium: Normal   Extracardiac structures: Left lung opacity, recommend CT chest for  further evaluation   IMPRESSION: 1. Mild LV dilatation with moderate systolic dysfunction (EF 33%). Global hypokinesis with focal area of thinning/akinesis in apical inferior  wall   2.  Normal RV size and systolic function (EF 49%)   3. Basal septal midwall LGE, which is a scar pattern seen in nonischemic cardiomyopathies and associated with worse prognosis   4. RV insertion site LGE, which is a nonspecific scar pattern often seen in setting of elevated pulmonary pressures   5. Subendocardial LGE in apical inferior wall, suggesting small infarct. Given normal coronary arteries on recent cath, could represent embolic event. Would also consider sarcoid on the differential, as can cause both midwall and subendocardial LGE. Recommend FDG-PET to evaluate for sarcoid.   6.  Left lung opacity, recommend CT chest for further evaluation     Electronically Signed   By: Epifanio Lesches M.D.   On: 06/10/2022 13:04 Assessment & Plan   1.  Chronic combined systolic and diastolic CHF-cardiac MRI showed EF of 33%.  No increased DOE or activity intolerance.  Euvolemic.  Weight stable.  Further evaluation with FD G-PET/CT was recommended for evaluation to rule out cardiac sarcoid. fluorodeoxyglucose (FDG) Continue current medical therapy Maintain weight log. Heart healthy low-sodium diet Order FDG-PET/CT CT chest without contrast for left lung opacity-offered and wishes to defer at this time.  Essential hypertension-BP today 128/82 Continue current medical therapy Maintain blood pressure log  Palpitations-denies increased incidence of palpitations.  Staying fairly active. Continue metoprolol Avoid triggers Follows with EP  Atrial fibrillation-heart rate today 95.  Rate control was recommended. Follows with EP.  Details above. Follow-up with Dr. Lalla Brothers as scheduled  Hyperlipidemia-LDL 86 on 11/23. Continue atorvastatin High-fiber diet  Disposition: Follow-up with Dr. Bjorn Pippin after cardiac PET/CT   Thomasene Ripple. Kandace Elrod NP-C     06/14/2022, 2:30 PM [facility] [address] Office [phone] Fax [phone]    I spent 14 minutes examining this patient, reviewing medications, and using patient centered shared decision making involving her cardiac care.  Prior to her visit I spent greater than 20 minutes reviewing her past medical history,  medications, and prior cardiac tests.

## 2022-06-14 ENCOUNTER — Ambulatory Visit: Payer: Commercial Managed Care - HMO | Attending: General Practice | Admitting: General Practice

## 2022-06-14 ENCOUNTER — Encounter: Payer: Self-pay | Admitting: General Practice

## 2022-06-14 VITALS — BP 128/82 | HR 95 | Ht 71.0 in | Wt 296.4 lb

## 2022-06-14 DIAGNOSIS — I10 Essential (primary) hypertension: Secondary | ICD-10-CM

## 2022-06-14 DIAGNOSIS — I5022 Chronic systolic (congestive) heart failure: Secondary | ICD-10-CM

## 2022-06-14 DIAGNOSIS — I4891 Unspecified atrial fibrillation: Secondary | ICD-10-CM

## 2022-06-14 DIAGNOSIS — I5042 Chronic combined systolic (congestive) and diastolic (congestive) heart failure: Secondary | ICD-10-CM

## 2022-06-14 DIAGNOSIS — I4729 Other ventricular tachycardia: Secondary | ICD-10-CM | POA: Diagnosis not present

## 2022-06-14 DIAGNOSIS — E785 Hyperlipidemia, unspecified: Secondary | ICD-10-CM

## 2022-06-14 NOTE — Patient Instructions (Signed)
Medication Instructions:  The current medical regimen is effective;  continue present plan and medications as directed. Please refer to the Current Medication list given to you today.  *If you need a refill on your cardiac medications before your next appointment, please call your pharmacy*  Lab Work: DONE If you have labs (blood work) drawn today and your tests are completely normal, you will receive your results only by:  MyChart Message (if you have MyChart) OR  A paper copy in the mail If you have any lab test that is abnormal or we need to change your treatment, we will call you to review the results.  Testing/Procedures: Cardiac Sarcoidosis/Inflammation PET Scan-SEE BELOW  Follow-Up: At [facility], you and your health needs are our priority.  As part of our continuing mission to provide you with exceptional heart care, we have created designated Provider Care Teams.  These Care Teams include your primary Cardiologist (physician) and Advanced Practice Providers (APPs -  Physician Assistants and Nurse Practitioners) who all work together to provide you with the care you need, when you need it.  Your next appointment:   AFTER PET   Provider:   Little Ishikawa, MD     MAKE SURE TO KEEP YOUR SCHEDULED APPOINTMENT WITH DR Lalla Brothers  Other Instructions MAINTAIN WEIGHT LOSS AND YOUR DIET       Cardiac Sarcoidosis/Inflammation PET Scan Patient Instructions  Please report to Radiology at the [facility] Main Entrance 15 minutes early for your test.  [address] [place], [place] [address] BRING FOOD DIARY WITH YOU TO THIS APPOINTMENT For 24 hours before the test: Do not exercise! Do not eat after 5 pm the day before your test! To make sure that your scanning results are accurate, you MUST follow the sarcoid prep meal diet starting the day before your PET scan. This diet involves eating no carbohydrates 24 hours before the test.  You will keep a log of  all that you eat the day before your test. If you have questions or do not understand this diet, please call [phone] for more information. If you are unable to follow this diet, please discuss an alternative strategy with the coordinator.  If you are diabetic, continue your diabetes medications as usual on the day before until you begin to fast. NO DIABETES MEDICATIONS ONCE YOU BEGIN TO FAST. What foods can I eat the day before my test?  Drink only water or black coffee (WITHOUT sugar, artificial sweetener, cream, or milk). Eggs (prepared without milk or cheese)  Meat that is either broiled or pan fried in butter WITHOUT breading (chicken, [place], bacon, meat-only sausage, hamburger, steak, fish) Butter, salt & pepper What foods must I AVOID the day before my test?  Do not consume alcoholic beverages, sodas, fruit juice, coffee creamer, or sports drinks  Do not eat vegetables, beans, nuts, fruits, juices, bread, grains, rice, pasta, potatoes, or any baked goods Do not eat dairy products (milk, cheese, etc.)  Do not eat mayonnaise, ketchup, tartar sauce, mustard, or other condiments Do not add sugar, artificial sweeteners, or Splenda (sucralose) to foods or drinks  Do not eat breaded foods (like fried chicken)  Do not eat sweets, candy, gum, sweetened cough drops, lozenges, or sugar  Do not eat sweetened, grilled, or cured meats or meat with carbohydrate-containing additives (some sausages, ham, sweetened bacon) Suggested items for breakfast, lunch, or dinner:  Breakfast  3 to 5 fatty sausage links fried in butter. 3  to 5 bacon strips.  3 eggs pan fried in butter (no milk or cheese).  Lunch/Dinner  2 hamburger patties fried in butter. Chicken or fatty fish pan fried in butter. No breading. 8 oz. fatty steak pan fried in butter.  Beverages  Drink only water or black coffee. DO NOT ADD SUGAR, ARTIFICIAL SWEETENER, CREAM, OR MILK       Cardiac Sarcoidosis/Inflammation PET Scan   Food Diary Name: _____________________________ Please fill in EXACTLY what you have eaten and when for 24 hours PRIOR to your test date.  Time Food/Drink Comments  Breakfast                Lunch                Dinner                Snacks                 DO NOT EXERCISE THE DAY BEFORE YOUR TEST DO NOT EAT AFTER 5 PM THE DAY BEFORE YOUR TEST.  ON THE DAY OF YOUR TEST, DO NOT EAT ANY FOOD AND ONLY DRINK CLEAR WATER! PLEASE BRING THIS FOOD DIARY WITH YOU TO YOUR APPOINTMENT

## 2022-06-26 NOTE — Progress Notes (Unsigned)
  Electrophysiology Office Follow up Visit Note:    Date:  06/26/2022   ID:  William Fischer, DOB 1965-11-20, MRN [id number]  PCP:  Mendel Ryder, PA-C  [facility] Cardiologist:  Little Ishikawa, MD  [facility] Electrophysiologist:  Lanier Prude, MD    Interval History:    William Fischer is a 57 y.o. male who presents for a follow up visit.   I saw the patient February 25, 2022 for atrial fibrillation and NSVT.  He has permanent A-fib.  We planned for MRI to better assess his cardiac function given history of reduced ejection fraction.       Past medical, surgical, social and family history were reviewed.  ROS:   Please see the history of present illness.    All other systems reviewed and are negative.  EKGs/Labs/Other Studies Reviewed:    The following studies were reviewed today:  Jun 10, 2022 cardiac MRI EF 33% Subendocardial LGE in the apical inferior wall Basal septal mid wall LGE Normal RV size and function Sub endocardial LGE in the apical inferior wall suggested small infarct but given normal coronary arteries, raises the possibility of cardiac sarcoidosis.  PET scan recommended.  December 23, 2021 ZIO monitor Frequent ventricular ectopy, 9.3% 100% A-fib, average rate 100 bpm    Physical Exam:    VS:  There were no vitals taken for this visit.    Wt Readings from Last 3 Encounters:  06/14/22 296 lb 6.4 oz (134.4 kg)  02/25/22 (!) 310 lb (140.6 kg)  02/24/22 (!) 309 lb 9.6 oz (140.4 kg)     GEN: *** Well nourished, well developed in no acute distress CARDIAC: ***RRR, no murmurs, rubs, gallops RESPIRATORY:  Clear to auscultation without rales, wheezing or rhonchi       ASSESSMENT:    1. Chronic combined systolic and diastolic heart failure (HCC)   2. Permanent atrial fibrillation (HCC)   3. NSVT (nonsustained ventricular tachycardia) (HCC)    PLAN:    In order of problems listed above:  #Chronic systolic heart failure NYHA  class II-III.  Warm and dry on exam.  No significant coronary artery disease.  MRI findings suggestive of prior embolic ischemic event versus cardiac sarcoidosis. Order cardiac PET-FDG to evaluate for sarcoidosis Discussed the role of ICD for primary prevention of sudden cardiac death.  Given his diagnosis of heart failure and atrial fibrillation with elevated average ventricular rates on ZIO monitor, favor CRT-D with possible future AV junction ablation.  #Frequent PVCs FDG-PET as above Add ranolazine 500mg  PO BID*** Add magnesium oxide 400mg  PO once daily  #permanent AF On xarelto for stroke ppx          Total time spent with patient today *** minutes. This includes reviewing records, evaluating the patient and coordinating care.    Signed, Steffanie Dunn, MD, [facility], [facility] 06/26/2022 4:47 PM    Electrophysiology  [facility]

## 2022-06-27 ENCOUNTER — Encounter: Payer: Self-pay | Admitting: Cardiology

## 2022-06-27 ENCOUNTER — Ambulatory Visit: Payer: Commercial Managed Care - HMO | Attending: Cardiology | Admitting: Cardiology

## 2022-06-27 VITALS — BP 134/84 | HR 90 | Ht 70.5 in | Wt 295.0 lb

## 2022-06-27 DIAGNOSIS — I5042 Chronic combined systolic (congestive) and diastolic (congestive) heart failure: Secondary | ICD-10-CM | POA: Diagnosis not present

## 2022-06-27 DIAGNOSIS — I4729 Other ventricular tachycardia: Secondary | ICD-10-CM | POA: Diagnosis not present

## 2022-06-27 DIAGNOSIS — I4821 Permanent atrial fibrillation: Secondary | ICD-10-CM

## 2022-06-27 NOTE — Patient Instructions (Addendum)
Medication Instructions:  Your physician recommends that you continue on your current medications as directed. Please refer to the Current Medication list given to you today.  *If you need a refill on your cardiac medications before your next appointment, please call your pharmacy*  Lab Work: BMET and CBC on Monday, July 8th - anytime between 7:30am and 4:30pm   Testing/Procedures: Your physician has recommended that you have a defibrillator inserted. An implantable cardioverter defibrillator (ICD) is a small device that is placed in your chest or, in rare cases, your abdomen. This device uses electrical pulses or shocks to help control life-threatening, irregular heartbeats that could lead the heart to suddenly stop beating (sudden cardiac arrest). Leads are attached to the ICD that goes into your heart. This is done in the hospital and usually requires an overnight stay. Please see the instruction sheet given to you today for more information. You are scheduled for an ICD implant on Monday, July 15 with Dr. Steffanie Dunn.Please arrive at the Main Entrance A at [facility]: [address] [place], [place] [address] at 11:30 AM    Your physician has recommended that you have an ablation. Catheter ablation is a medical procedure used to treat some cardiac arrhythmias (irregular heartbeats). During catheter ablation, a long, thin, flexible tube is put into a blood vessel in your groin (upper thigh), or neck. This tube is called an ablation catheter. It is then guided to your heart through the blood vessel. Radio frequency waves destroy small areas of heart tissue where abnormal heartbeats may cause an arrhythmia to start. Please see the instruction sheet given to you today. You are scheduled for  AV Node Ablation  on Tuesday, September 3 with Dr. Steffanie Dunn.Please arrive at the Main Entrance A at [facility]: [address] [place], [place] [address] at 11:30 AM    Follow-Up: At  [facility], you and your health needs are our priority.  As part of our continuing mission to provide you with exceptional heart care, we have created designated Provider Care Teams.  These Care Teams include your primary Cardiologist (physician) and Advanced Practice Providers (APPs -  Physician Assistants and Nurse Practitioners) who all work together to provide you with the care you need, when you need it.  Your next appointment:   We will call you to schedule your follow up appointment

## 2022-06-28 ENCOUNTER — Other Ambulatory Visit: Payer: Self-pay

## 2022-06-28 DIAGNOSIS — I4821 Permanent atrial fibrillation: Secondary | ICD-10-CM

## 2022-06-28 DIAGNOSIS — I5042 Chronic combined systolic (congestive) and diastolic (congestive) heart failure: Secondary | ICD-10-CM

## 2022-06-29 ENCOUNTER — Ambulatory Visit: Payer: Commercial Managed Care - HMO | Admitting: Cardiology

## 2022-06-29 MED ORDER — DIPHENHYDRAMINE HCL 50 MG PO CAPS: ORAL_CAPSULE | ORAL | 0 refills | Status: DC

## 2022-06-29 MED ORDER — PREDNISONE 50 MG PO TABS: ORAL_TABLET | ORAL | 0 refills | Status: DC

## 2022-06-29 NOTE — Addendum Note (Signed)
Addended by: Frutoso Schatz on: 06/29/2022 07:59 AM   Modules accepted: Orders

## 2022-06-29 NOTE — Addendum Note (Signed)
Addended by: Frutoso Schatz on: 06/29/2022 09:29 AM   Modules accepted: Orders

## 2022-07-25 ENCOUNTER — Ambulatory Visit: Payer: Commercial Managed Care - HMO | Attending: Cardiology

## 2022-07-25 DIAGNOSIS — I4821 Permanent atrial fibrillation: Secondary | ICD-10-CM | POA: Diagnosis not present

## 2022-07-25 DIAGNOSIS — I4729 Other ventricular tachycardia: Secondary | ICD-10-CM

## 2022-07-25 DIAGNOSIS — I5042 Chronic combined systolic (congestive) and diastolic (congestive) heart failure: Secondary | ICD-10-CM

## 2022-07-25 LAB — BASIC METABOLIC PANEL
BUN/Creatinine Ratio: 11 (ref 9–20)
Calcium: 9.6 mg/dL (ref 8.7–10.2)

## 2022-07-25 LAB — CBC WITH DIFFERENTIAL/PLATELET

## 2022-07-26 ENCOUNTER — Other Ambulatory Visit: Payer: Self-pay

## 2022-07-26 ENCOUNTER — Emergency Department (HOSPITAL_BASED_OUTPATIENT_CLINIC_OR_DEPARTMENT_OTHER)
Admission: EM | Admit: 2022-07-26 | Discharge: 2022-07-26 | Disposition: A | Discharge status: Home / Self Care | Payer: 59 | Attending: Emergency Medicine | Admitting: Emergency Medicine

## 2022-07-26 ENCOUNTER — Encounter (HOSPITAL_BASED_OUTPATIENT_CLINIC_OR_DEPARTMENT_OTHER): Payer: Self-pay

## 2022-07-26 DIAGNOSIS — L02214 Cutaneous abscess of groin: Secondary | ICD-10-CM

## 2022-07-26 DIAGNOSIS — R1031 Right lower quadrant pain: Secondary | ICD-10-CM | POA: Diagnosis not present

## 2022-07-26 DIAGNOSIS — I4891 Unspecified atrial fibrillation: Secondary | ICD-10-CM | POA: Insufficient documentation

## 2022-07-26 DIAGNOSIS — M65051 Abscess of tendon sheath, right thigh: Secondary | ICD-10-CM | POA: Insufficient documentation

## 2022-07-26 DIAGNOSIS — Z7901 Long term (current) use of anticoagulants: Secondary | ICD-10-CM | POA: Diagnosis not present

## 2022-07-26 LAB — CBC WITH DIFFERENTIAL/PLATELET
Basophils Absolute: 0.1 10*3/uL (ref 0.0–0.2)
Basos: 1 %
EOS (ABSOLUTE): 0.2 10*3/uL (ref 0.0–0.4)
Eos: 2 %
Hematocrit: 48.6 % (ref 37.5–51.0)
Hemoglobin: 16.3 g/dL (ref 13.0–17.7)
Immature Grans (Abs): 0 10*3/uL (ref 0.0–0.1)
Immature Granulocytes: 0 %
Lymphocytes Absolute: 1.5 10*3/uL (ref 0.7–3.1)
MCH: 31.2 pg (ref 26.6–33.0)
MCV: 93 fL (ref 79–97)
Monocytes Absolute: 0.8 10*3/uL (ref 0.1–0.9)
Monocytes: 9 %
Neutrophils Absolute: 6 10*3/uL (ref 1.4–7.0)
Platelets: 253 10*3/uL (ref 150–450)
RBC: 5.22 x10E6/uL (ref 4.14–5.80)
RDW: 13.7 % (ref 11.6–15.4)
WBC: 8.6 10*3/uL (ref 3.4–10.8)

## 2022-07-26 LAB — BASIC METABOLIC PANEL
BUN: 11 mg/dL (ref 6–24)
CO2: 26 mmol/L (ref 20–29)
Chloride: 99 mmol/L (ref 96–106)
Creatinine, Ser: 1.04 mg/dL (ref 0.76–1.27)
Glucose: 139 mg/dL — ABNORMAL HIGH (ref 70–99)
Potassium: 4.3 mmol/L (ref 3.5–5.2)
Sodium: 138 mmol/L (ref 134–144)
eGFR: 85 mL/min/{1.73_m2} (ref >59)

## 2022-07-26 MED ORDER — DOXYCYCLINE HYCLATE 100 MG PO CAPS: 100.0000 mg | ORAL_CAPSULE | Freq: Two times a day (BID) | ORAL | 0 refills | Status: DC

## 2022-07-26 MED ORDER — DOXYCYCLINE HYCLATE 100 MG PO TABS
100.0000 mg | ORAL_TABLET | Freq: Once | ORAL | Status: AC
  Administered 2022-07-26: 100 mg via ORAL
  Filled 2022-07-26: qty 1

## 2022-07-26 NOTE — ED Provider Notes (Signed)
[facility]ERG[facility] DEPARTMENT AT [facility] Provider Note   CSN: [id number] Arrival date & time: 07/26/22  1943     History  Chief Complaint  Patient presents with   Abscess    William Fischer is a 57 y.o. male.  Patient is a 57 year old male with past medical history of atrial fibrillation on Xarelto, hyperlipidemia.  Patient presenting today with complaints of pain and swelling in his right groin.  He reports that for several months he has had recurrent swollen areas to the right groin.  Yesterday evening this area opened up and he reports dark/purulent material.  He denies any fevers or chills.  The history is provided by the patient.       Home Medications Prior to Admission medications   Medication Sig Start Date End Date Taking? Authorizing Provider  acetaminophen (TYLENOL) 500 MG tablet Take 1,000 mg by mouth every 6 (six) hours as needed (pain).    [provider]  b complex vitamins capsule Take 1 capsule by mouth daily.    [provider]  dapagliflozin propanediol (FARXIGA) 10 MG TABS tablet Take 1 tablet (10 mg total) by mouth daily. 11/22/21   Little Ishikawa, MD  diphenhydrAMINE (BENADRYL) 50 MG capsule Take 1 tablet (50 mg) prior to going to the hospital for your procedure 06/29/22   Lanier Prude, MD  metoprolol succinate (TOPROL-XL) 50 MG 24 hr tablet Take 2 tablets (100 mg total) by mouth daily. Take with or immediately following a meal. 12/23/21   Little Ishikawa, MD  metoprolol tartrate (LOPRESSOR) 100 MG tablet Take 100 mg by mouth 2 (two) times daily. 01/31/22   [provider]  Multiple Vitamins-Minerals (ONE-A-DAY 50 PLUS PO) Take 1 capsule by mouth daily in the afternoon.    [provider]  predniSONE (DELTASONE) 50 MG tablet Take 1 tablet 13 hours prior to procedure, 1 tablet 7 hours prior to procedure, and 1 tablet (with Benedryl 50 mg) prior to going to the hospital for procedure 06/29/22    Lanier Prude, MD  rivaroxaban (XARELTO) 20 MG TABS tablet TAKE 1 TABLET BY MOUTH DAILY WITH SUPPER. 01/31/22   Little Ishikawa, MD  rosuvastatin (CRESTOR) 20 MG tablet Take 1 tablet (20 mg total) by mouth daily. 11/23/21 11/18/22  Little Ishikawa, MD  sacubitril-valsartan (ENTRESTO) 24-26 MG Take 1 tablet by mouth 2 (two) times daily. 02/08/22   Little Ishikawa, MD  spironolactone (ALDACTONE) 25 MG tablet Take 1 tablet (25 mg total) by mouth daily. 09/22/21   Little Ishikawa, MD  torsemide (DEMADEX) 10 MG tablet Take 1 tablet (10 mg total) by mouth daily. 09/22/21   Little Ishikawa, MD      Allergies    Contrast media [iodinated contrast media] and Tramadol    Review of Systems   Review of Systems  All other systems reviewed and are negative.   Physical Exam Updated Vital Signs BP 139/75   Pulse (!) 103   Temp 98.3 F (36.8 C)   Resp (!) 21   Ht 5\' 11"  (1.803 m)   Wt 133.4 kg   SpO2 95%   BMI 41.00 kg/m  Physical Exam Vitals and nursing note reviewed.  Constitutional:      Appearance: Normal appearance.  Pulmonary:     Effort: Pulmonary effort is normal.  Skin:    General: Skin is warm and dry.     Comments: There is an area of erythema/induration with purulent and  bloody material draining from a central open area.  There is no palpable fluctuance.  This is medial to the thigh and not in vicinity of the femoral artery.  Neurological:     Mental Status: He is alert and oriented to person, place, and time.     ED Results / Procedures / Treatments   Labs (all labs ordered are listed, but only abnormal results are displayed) Labs Reviewed - No data to display  EKG None  Radiology No results found.  Procedures Procedures    Medications Ordered in ED Medications  doxycycline (VIBRA-TABS) tablet 100 mg (has no administration in time range)    ED Course/ Medical Decision Making/ A&P  Patient presenting with swelling and  drainage from the inside of his right thigh consistent with an abscess.  This appears to have drained spontaneously.  I see no indication at this time for incision or drainage, but will treat with doxycycline and warm compresses.  Patient to follow-up as needed.  Final Clinical Impression(s) / ED Diagnoses Final diagnoses:  None    Rx / DC Orders ED Discharge Orders     None         Geoffery Lyons, MD 07/26/22 2315

## 2022-07-26 NOTE — ED Triage Notes (Signed)
Patient here POV from Home.  Endorses Heart Catheterization and Ablation almost a year ago. Went through Right Groin and since then has had problems with Possibly Cyst or Infection at the Site.   More recent Problems with Same over 3 Days. Some Drainage Last PM.   NAD Noted during Triage. A&Ox4. GCS 15. Ambulatory.

## 2022-07-26 NOTE — Discharge Instructions (Signed)
Begin taking doxycycline as prescribed.  Apply warm compresses as frequently as possible for the next several days.  Follow-up with primary doctor if not improving in the next week.

## 2022-07-29 ENCOUNTER — Encounter: Payer: Self-pay | Admitting: Cardiology

## 2022-08-01 ENCOUNTER — Other Ambulatory Visit: Payer: Self-pay

## 2022-08-01 ENCOUNTER — Encounter (HOSPITAL_COMMUNITY)
Admission: RE | Disposition: A | Discharge status: Home / Self Care | Payer: Self-pay | Source: Ambulatory Visit | Attending: Cardiology

## 2022-08-01 ENCOUNTER — Ambulatory Visit (HOSPITAL_COMMUNITY)
Admission: RE | Admit: 2022-08-01 | Discharge: 2022-08-02 | Disposition: A | Discharge status: Home / Self Care | Payer: Medicaid Other | Source: Ambulatory Visit | Attending: Cardiology | Admitting: Cardiology

## 2022-08-01 DIAGNOSIS — I472 Ventricular tachycardia, unspecified: Secondary | ICD-10-CM | POA: Diagnosis not present

## 2022-08-01 DIAGNOSIS — I110 Hypertensive heart disease with heart failure: Secondary | ICD-10-CM | POA: Insufficient documentation

## 2022-08-01 DIAGNOSIS — I5022 Chronic systolic (congestive) heart failure: Secondary | ICD-10-CM | POA: Insufficient documentation

## 2022-08-01 DIAGNOSIS — I428 Other cardiomyopathies: Secondary | ICD-10-CM | POA: Diagnosis not present

## 2022-08-01 DIAGNOSIS — Z7901 Long term (current) use of anticoagulants: Secondary | ICD-10-CM | POA: Diagnosis not present

## 2022-08-01 DIAGNOSIS — Z6841 Body Mass Index (BMI) 40.0 and over, adult: Secondary | ICD-10-CM | POA: Diagnosis not present

## 2022-08-01 DIAGNOSIS — Z95810 Presence of automatic (implantable) cardiac defibrillator: Secondary | ICD-10-CM | POA: Diagnosis present

## 2022-08-01 DIAGNOSIS — I4821 Permanent atrial fibrillation: Secondary | ICD-10-CM | POA: Diagnosis not present

## 2022-08-01 DIAGNOSIS — I2510 Atherosclerotic heart disease of native coronary artery without angina pectoris: Secondary | ICD-10-CM | POA: Diagnosis not present

## 2022-08-01 DIAGNOSIS — Z79899 Other long term (current) drug therapy: Secondary | ICD-10-CM | POA: Diagnosis not present

## 2022-08-01 HISTORY — PX: BIV ICD INSERTION CRT-D: EP1195

## 2022-08-01 SURGERY — BIV ICD INSERTION CRT-D

## 2022-08-01 MED ORDER — LIDOCAINE HCL 1 % IJ SOLN
INTRAMUSCULAR | Status: AC
  Filled 2022-08-01: qty 60

## 2022-08-01 MED ORDER — SODIUM CHLORIDE 0.9 % IV SOLN
80.0000 mg | INTRAVENOUS | Status: AC
  Administered 2022-08-01: 80 mg

## 2022-08-01 MED ORDER — ACETAMINOPHEN 325 MG PO TABS
325.0000 mg | ORAL_TABLET | ORAL | Status: DC | PRN
  Administered 2022-08-01 – 2022-08-02 (×2): 650 mg via ORAL
  Filled 2022-08-01 (×2): qty 2

## 2022-08-01 MED ORDER — CEFAZOLIN IV (FOR PTA / DISCHARGE USE ONLY): 3.0000 g | INTRAVENOUS | Status: DC

## 2022-08-01 MED ORDER — HEPARIN (PORCINE) IN NACL 1000-0.9 UT/500ML-% IV SOLN
INTRAVENOUS | Status: DC | PRN
  Administered 2022-08-01: 500 mL

## 2022-08-01 MED ORDER — SACUBITRIL-VALSARTAN 24-26 MG PO TABS
1.0000 | ORAL_TABLET | Freq: Two times a day (BID) | ORAL | Status: DC
  Administered 2022-08-01 – 2022-08-02 (×2): 1 via ORAL
  Filled 2022-08-01 (×2): qty 1

## 2022-08-01 MED ORDER — CEFAZOLIN IN SODIUM CHLORIDE 3-0.9 GM/100ML-% IV SOLN
3.0000 g | INTRAVENOUS | Status: DC
  Administered 2022-08-01: 3 g via INTRAVENOUS

## 2022-08-01 MED ORDER — SPIRONOLACTONE 25 MG PO TABS
25.0000 mg | ORAL_TABLET | Freq: Every day | ORAL | Status: DC
  Administered 2022-08-02: 25 mg via ORAL
  Filled 2022-08-01: qty 1

## 2022-08-01 MED ORDER — METOPROLOL SUCCINATE ER 50 MG PO TB24
75.0000 mg | ORAL_TABLET | Freq: Two times a day (BID) | ORAL | Status: DC
  Administered 2022-08-01 – 2022-08-02 (×2): 75 mg via ORAL
  Filled 2022-08-01 (×2): qty 1

## 2022-08-01 MED ORDER — TORSEMIDE 10 MG PO TABS
10.0000 mg | ORAL_TABLET | Freq: Every day | ORAL | Status: DC
  Administered 2022-08-02: 10 mg via ORAL
  Filled 2022-08-01: qty 1

## 2022-08-01 MED ORDER — FENTANYL CITRATE (PF) 100 MCG/2ML IJ SOLN
INTRAMUSCULAR | Status: AC
  Filled 2022-08-01: qty 2

## 2022-08-01 MED ORDER — POVIDONE-IODINE 10 % EX SWAB
2.0000 | Freq: Once | CUTANEOUS | Status: AC
  Administered 2022-08-01: 2 via TOPICAL

## 2022-08-01 MED ORDER — LIDOCAINE HCL (PF) 1 % IJ SOLN
INTRAMUSCULAR | Status: DC | PRN
  Administered 2022-08-01: 60 mL

## 2022-08-01 MED ORDER — SODIUM CHLORIDE 0.9 % IV SOLN: INTRAVENOUS | Status: DC

## 2022-08-01 MED ORDER — MIDAZOLAM HCL 5 MG/5ML IJ SOLN
INTRAMUSCULAR | Status: AC
  Filled 2022-08-01: qty 5

## 2022-08-01 MED ORDER — CEFAZOLIN SODIUM-DEXTROSE 1-4 GM/50ML-% IV SOLN
1.0000 g | Freq: Four times a day (QID) | INTRAVENOUS | Status: AC
  Administered 2022-08-01 – 2022-08-02 (×3): 1 g via INTRAVENOUS
  Filled 2022-08-01 (×3): qty 50

## 2022-08-01 MED ORDER — FENTANYL CITRATE (PF) 100 MCG/2ML IJ SOLN
INTRAMUSCULAR | Status: DC | PRN
  Administered 2022-08-01 (×2): 25 ug via INTRAVENOUS

## 2022-08-01 MED ORDER — SODIUM CHLORIDE 0.9 % IV SOLN
INTRAVENOUS | Status: AC
  Filled 2022-08-01: qty 2

## 2022-08-01 MED ORDER — MIDAZOLAM HCL 5 MG/5ML IJ SOLN
INTRAMUSCULAR | Status: DC | PRN
  Administered 2022-08-01 (×2): 1 mg via INTRAVENOUS

## 2022-08-01 MED ORDER — DEXTROSE 5 % IV SOLN
3.0000 g | Freq: Once | INTRAVENOUS | Status: DC
  Filled 2022-08-01: qty 3000

## 2022-08-01 MED ORDER — DAPAGLIFLOZIN PROPANEDIOL 10 MG PO TABS
10.0000 mg | ORAL_TABLET | Freq: Every day | ORAL | Status: DC
  Administered 2022-08-02: 10 mg via ORAL
  Filled 2022-08-01: qty 1

## 2022-08-01 MED ORDER — ONDANSETRON HCL 4 MG/2ML IJ SOLN: 4.0000 mg | Freq: Four times a day (QID) | INTRAMUSCULAR | Status: DC | PRN

## 2022-08-01 MED ORDER — CEFAZOLIN IN SODIUM CHLORIDE 3-0.9 GM/100ML-% IV SOLN: 3.0000 g | INTRAVENOUS | Status: DC

## 2022-08-01 SURGICAL SUPPLY — 16 items
BALLN ATTAIN 80 (BALLOONS) ×1
BALLOON ATTAIN 80 (BALLOONS) IMPLANT
CABLE SURGICAL S-101-97-12 (CABLE) ×1 IMPLANT
CATH ATTAIN COM SURV 6250V-EH (CATHETERS) IMPLANT
ICD COBALT XT QUAD CRT DTPA2QQ (ICD Generator) IMPLANT
LEAD ATTAIN PERFORMA S 4598-88 (Lead) IMPLANT
LEAD SPRINT QUAT SEC 6935M-62 (Lead) IMPLANT
PAD DEFIB RADIO PHYSIO CONN (PAD) ×1 IMPLANT
POUCH AIGIS-R ANTIBACT ICD (Mesh General) ×1 IMPLANT
POUCH AIGIS-R ANTIBACT ICD LRG (Mesh General) IMPLANT
SHEATH 9FR PRELUDE SNAP 13 (SHEATH) IMPLANT
SHEATH PROBE COVER 6X72 (BAG) IMPLANT
SLITTER 6232ADJ (MISCELLANEOUS) IMPLANT
TRAY PACEMAKER INSERTION (PACKS) ×1 IMPLANT
WIRE ACUITY WHISPER EDS 4648 (WIRE) IMPLANT
WIRE HI TORQ VERSACORE-J 145CM (WIRE) IMPLANT

## 2022-08-01 NOTE — Plan of Care (Signed)
  Problem: Education: Goal: Knowledge of cardiac device and self-care will improve Outcome: Progressing   Problem: Education: Goal: Ability to safely manage health related needs after discharge will improve Outcome: Progressing

## 2022-08-01 NOTE — Progress Notes (Signed)
Patient stated he took premeds- prednisone 12 midnight/0400/0700                                                     Benadryl 0700 am 08/01/22

## 2022-08-01 NOTE — H&P (Signed)
Electrophysiology Office Follow up Visit Note:     Date:  08/01/2022    ID:  William Fischer, DOB 1965/10/13, MRN [id number]   PCP:  Mendel Ryder, PA-C       [facility] Cardiologist:  Little Ishikawa, MD  [facility] Electrophysiologist:  Lanier Prude, MD      Interval History:     William Fischer is a 57 y.o. male who presents for a follow up visit.    I saw the patient February 25, 2022 for atrial fibrillation and NSVT.  He has permanent A-fib.  We planned for MRI to better assess his cardiac function given history of reduced ejection fraction.  He continues to feel short of breath with exertion.   He is with his mother today in clinic.    Presents for CRT implant today.     Objective Past medical, surgical, social and family history were reviewed.   ROS:   Please see the history of present illness.    All other systems reviewed and are negative.   EKGs/Labs/Other Studies Reviewed:     The following studies were reviewed today:   Jun 10, 2022 cardiac MRI EF 33% Subendocardial LGE in the apical inferior wall Basal septal mid wall LGE Normal RV size and function Sub endocardial LGE in the apical inferior wall suggested small infarct but given normal coronary arteries, raises the possibility of cardiac sarcoidosis.  PET scan recommended.   December 23, 2021 ZIO monitor Frequent ventricular ectopy, 9.3% 100% A-fib, average rate 100 bpm       Physical Exam:     VS: 98.41F, 103, 20, 128/89      Wt Readings from Last 3 Encounters:  06/14/22 296 lb 6.4 oz (134.4 kg)  02/25/22 (!) 310 lb (140.6 kg)  02/24/22 (!) 309 lb 9.6 oz (140.4 kg)      GEN:  Well nourished, well developed in no acute distress.  Obese CARDIAC: Irregularly irregular, no murmurs, rubs, gallops RESPIRATORY:  Clear to auscultation without rales, wheezing or rhonchi          Assessment ASSESSMENT:     1. Chronic combined systolic and diastolic heart failure (HCC)   2.  Permanent atrial fibrillation (HCC)   3. NSVT (nonsustained ventricular tachycardia) (HCC)     PLAN:     In order of problems listed above:   #Chronic systolic heart failure NYHA class II-III.  Warm and dry on exam.  No significant coronary artery disease.  MRI findings suggestive of prior embolic ischemic event versus cardiac sarcoidosis. Order cardiac PET-FDG to evaluate for sarcoidosis Discussed the role of ICD for primary prevention of sudden cardiac death.  Given his diagnosis of heart failure and atrial fibrillation with elevated average ventricular rates on ZIO monitor, favor CRT-D with possible future AV junction ablation.   Will plan for an RV and LV lead only.  No need for atrial lead given permanent atrial fibrillation.   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  He is referred by Dr Bjorn Pippin for risk stratification of sudden death and consideration of ICD implantation.  At this time, he meets criteria for ICD implantation for primary prevention of sudden death.  I have had a thorough discussion with the patient reviewing options.  The patient and their family (if available) have had opportunities to ask questions and have them answered. The patient and I have decided together through a shared decision making process  to proceed with ICD implant at this time.     Risks, benefits, alternatives to ICD implantation were discussed in detail with the patient today. The patient understands that the risks include but are not limited to bleeding, infection, pneumothorax, perforation, tamponade, vascular damage, renal failure, MI, stroke, death, inappropriate shocks, and lead dislodgement and wishes to proceed.  We will therefore schedule device implantation at the next available time.   ---------------------   Difficult to control heart rates with systolic heart failure.  Discussed the role of AV nodal ablation.  He would like to proceed with scheduling.  Will  plan to wait least 8 weeks after device implant before performing AV nodal ablation to allow leads to mature.   Risk, benefits, and alternatives to AV nodal ablation were discussed. These risks include but are not limited to stroke, bleeding, vascular damage, tamponade, perforation, worsening renal function, lead dislodgement and death.  The patient understands these risk and wishes to proceed.  We will therefore proceed with catheter ablation at the next available time.  Carto is requested for the procedure.     #Frequent PVCs FDG-PET as above Will add ranolazine 500mg  PO BID after device implant for PVC suppression Will add magnesium oxide 400mg  PO once daily after device implant for PVC suppression   #permanent AF On xarelto for stroke ppx. Hold 48 hours prior to device implant. AV nodal ablation as above        Presents for CRT implant. Procedure reviewed.     Signed, Steffanie Dunn, MD, [facility], [facility] 08/01/2022 Electrophysiology [facility]

## 2022-08-02 ENCOUNTER — Other Ambulatory Visit (HOSPITAL_COMMUNITY): Payer: Self-pay

## 2022-08-02 ENCOUNTER — Ambulatory Visit (HOSPITAL_COMMUNITY): Payer: Medicaid Other

## 2022-08-02 ENCOUNTER — Encounter (HOSPITAL_COMMUNITY): Payer: Self-pay | Admitting: Cardiology

## 2022-08-02 DIAGNOSIS — I110 Hypertensive heart disease with heart failure: Secondary | ICD-10-CM | POA: Diagnosis not present

## 2022-08-02 DIAGNOSIS — Z95810 Presence of automatic (implantable) cardiac defibrillator: Secondary | ICD-10-CM

## 2022-08-02 MED ORDER — METOPROLOL SUCCINATE ER 25 MG PO TB24
75.0000 mg | ORAL_TABLET | Freq: Two times a day (BID) | ORAL | 5 refills | Status: DC
  Filled 2022-08-02: qty 180, 30d supply, fill #0

## 2022-08-02 MED ORDER — FUROSEMIDE 10 MG/ML IJ SOLN
40.0000 mg | Freq: Once | INTRAMUSCULAR | Status: DC
Start: 2022-08-02 — End: 2022-08-02

## 2022-08-02 MED ORDER — FUROSEMIDE 10 MG/ML IJ SOLN
20.0000 mg | Freq: Once | INTRAMUSCULAR | Status: AC
  Administered 2022-08-02: 20 mg via INTRAVENOUS
  Filled 2022-08-02: qty 2

## 2022-08-02 NOTE — Discharge Summary (Addendum)
ELECTROPHYSIOLOGY PROCEDURE DISCHARGE SUMMARY    Patient ID: William Fischer,  MRN: [id number], DOB/AGE: 1965/08/11 57 y.o.  Admit date: 08/01/2022 Discharge date: 08/02/2022  Primary Care Physician: Pcp, No  Primary Cardiologist: Dr. Bjorn Pippin Electrophysiologist: Dr. Lalla Brothers  Primary Discharge Diagnosis:  NICM  Secondary Discharge Diagnosis:  Permanent AFib (Ablated 2021) > rate control strategy established this year CHA2DS2Vasc is 4, on Xarelto Chronic CHF (systolic) HTN Morbid obesity NSVT  Allergies  Allergen Reactions   Contrast Media [Iodinated Contrast Media] Rash    a diffuse macular rash after CTA chest  Pt was premedicated with 125mg  IV Solumedrol, 50mg  IV Benadryl 1 hr prior to CTexam, and tolerated procedure without any difficulties. 11/28/11 Also same pre med protocol observed on 02/22/15 and pt tolerated the procedure well   Tramadol      Procedures This Admission:  1.  Implantation of a MDT CRT-D on 7!5/24 by Dr Lalla Brothers.   DFT's were deferred at time of implant.   There were no immediate post procedure complications. 2.  CXR on  demonstrated no pneumothorax status post device implantation.   Brief HPI: William Fischer is a 57 y.o. male was referred to followed by electrophysiology in the outpatient setting felt to have become permanent AFib, with known NICM, reduced LVE despite GDMT, planned for CRT-D and eventual AV node ablation  Hospital Course:  The patient was admitted and underwent implantation of a with details as outlined in his procedure report.   He was monitored on telemetry overnight which demonstrated AFib intermittent V pacing.  Left chest was without hematoma or ecchymosis.  The device was interrogated and found to be functioning normally.  CXR was obtained and demonstrated no pneumothorax status post device implantation.   A dose of IV lasix dose prior to discharge, with findings c/w volume OL.   Wound care, arm mobility, and  restrictions were reviewed with the patient.  The patient feels well, denies any CP or SOB, minimal site discomfort, he was examined by Dr. Elberta Fortis and considered stable for discharge to home.   The patient's discharge medications include an ACE/ARB (Entresto) and beta blocker (metoprolol).   His Toprol was increased from 100mg  daily > 75mg  BID by Dr. Lalla Brothers post procedure.   Resume Xarelto 08/07/22  CXR also noted:  Persistent opacities in the midportion of both lungs. The left lung opacity has a relatively well-defined inferior contour and may been present on the previous examination. Difficult to exclude parenchymal or pleural lesion in this area. Recommend further evaluation with a CT chest.  Dr Elberta Fortis discussed with Dr. Bjorn Pippin he reports that he has been offered/recommended CT chest for abnormal pulmonary finding on his c.MRI though the pt declined. I discussed today with the patient his CXR findings and recommended offered CT chest done while he is here, though he again declines.   He reports the doxycycline is for a R thigh abscess/wound that has been/is being treated, nearly completed course, he reports Dr. Lalla Brothers evaluated the site and looked good, almost healed up, declines my evaluation of it this morning Advised to complete his antibiotic course   Physical Exam: Vitals:   08/01/22 1957 08/01/22 2325 08/02/22 0337 08/02/22 0748  BP: 119/82 109/68 103/71 100/68  Pulse: 96 80 60 82  Resp: 20 20 (!) 21 18  Temp: 98 F (36.7 C) 98.3 F (36.8 C) 97.9 F (36.6 C) 97.8 F (36.6 C)  TempSrc: Oral Oral Oral Oral  SpO2: 93% 93%  94% 92%  Weight:      Height:        GEN- The patient is well appearing, alert and oriented x 3 today.   HEENT: normocephalic, atraumatic; sclera clear, conjunctiva pink; hearing intact; oropharynx clear Lungs-  CTA b/l, normal work of breathing.  No wheezes, rales, rhonchi Heart- RRR, no murmurs, rubs or gallops, PMI not laterally  displaced GI- soft, non-tender, non-distended Extremities- no clubbing, cyanosis, or edema MS- no significant deformity or atrophy Skin- warm and dry, no rash or lesion, left chest without hematoma/ecchymosis Psych- euthymic mood, full affect Neuro- no gross defecits  Labs:   Lab Results  Component Value Date   WBC 8.6 07/25/2022   HGB 16.3 07/25/2022   HCT 48.6 07/25/2022   MCV 93 07/25/2022   PLT 253 07/25/2022   No results for input(s): "NA", "K", "CL", "CO2", "BUN", "CREATININE", "CALCIUM", "PROT", "BILITOT", "ALKPHOS", "ALT", "AST", "GLUCOSE" in the last 168 hours.  Invalid input(s): "LABALBU"  Discharge Medications:  Allergies as of 08/02/2022       Reactions   Contrast Media [iodinated Contrast Media] Rash   a diffuse macular rash after CTA chest  Pt was premedicated with 125mg  IV Solumedrol, 50mg  IV Benadryl 1 hr prior to CTexam, and tolerated procedure without any difficulties. 11/28/11 Also same pre med protocol observed on 02/22/15 and pt tolerated the procedure well   Tramadol         Medication List     STOP taking these medications    metoprolol tartrate 100 MG tablet Commonly known as: LOPRESSOR       TAKE these medications    acetaminophen 500 MG tablet Commonly known as: TYLENOL Take 1,000 mg by mouth every 6 (six) hours as needed (pain).   b complex vitamins capsule Take 1 capsule by mouth daily.   dapagliflozin propanediol 10 MG Tabs tablet Commonly known as: Farxiga Take 1 tablet (10 mg total) by mouth daily.   diphenhydrAMINE 50 MG capsule Commonly known as: BENADRYL Take 1 tablet (50 mg) prior to going to the hospital for your procedure   doxycycline 100 MG capsule Commonly known as: VIBRAMYCIN Take 1 capsule (100 mg total) by mouth 2 (two) times daily.   Entresto 24-26 MG Generic drug: sacubitril-valsartan Take 1 tablet by mouth 2 (two) times daily.   metoprolol succinate 25 MG 24 hr tablet Commonly known as: TOPROL-XL Take 3  tablets (75 mg total) by mouth 2 (two) times daily. Take with or immediately following a meal. What changed:  medication strength how much to take when to take this   ONE-A-DAY 50 PLUS PO Take 1 capsule by mouth daily in the afternoon.   predniSONE 50 MG tablet Commonly known as: DELTASONE Take 1 tablet 13 hours prior to procedure, 1 tablet 7 hours prior to procedure, and 1 tablet (with Benedryl 50 mg) prior to going to the hospital for procedure   rosuvastatin 20 MG tablet Commonly known as: CRESTOR Take 1 tablet (20 mg total) by mouth daily.   spironolactone 25 MG tablet Commonly known as: ALDACTONE Take 1 tablet (25 mg total) by mouth daily.   torsemide 10 MG tablet Commonly known as: DEMADEX Take 1 tablet (10 mg total) by mouth daily.   Xarelto 20 MG Tabs tablet Generic drug: rivaroxaban TAKE 1 TABLET BY MOUTH DAILY WITH SUPPER. Notes to patient: Resume on 08/07/22        Disposition: Home Discharge Instructions     Diet - low sodium heart healthy  Complete by: As directed    Increase activity slowly   Complete by: As directed         Duration of Discharge Encounter: Greater than 30 minutes including physician time.  Norma Fredrickson, PA-C 08/02/2022 10:44 AM   I have seen and examined this patient with Francis Dowse.  Agree with above, note added to reflect my findings.  On exam, RRR, no murmurs, lungs clear.  She is now status post CRT-D implant for heart failure and AF with plans for possible AVN ablation.  Device functioning appropriately.  Chest x-ray and interrogation without issue.  Plan for discharge today with follow-up in device clinic.  Sean Macwilliams M. Adelyn Roscher MD 08/02/2022 5:02 PM

## 2022-08-02 NOTE — TOC Transition Note (Signed)
Transition of Care [facility]) - CM/SW Discharge Note   Patient Details  Name: William Fischer MRN: [id number] Date of Birth: [birth year]  [facility] CM/SW Contact:  Leone Haven, RN Phone Number: 08/02/2022, 11:28 AM   Clinical Narrative:    For dc today, has no needs.    Final next level of care: Home/Self Care Barriers to Discharge: No Barriers Identified   Patient Goals and CMS Choice   Choice offered to / list presented to : NA  Discharge Placement                         Discharge Plan and Services Additional resources added to the After Visit Summary for   In-house Referral: NA Discharge Planning Services: CM Consult Post Acute Care Choice: NA          DME Arranged: N/A DME Agency: NA       HH Arranged: NA          Social Determinants of Health (SDOH) Interventions SDOH Screenings   Food Insecurity: Low Risk  (02/17/2022)   Received from [facility]  Transportation Needs: No Transportation Needs (02/17/2022)   Received from [facility], [facility]  Utilities: Low Risk  (02/17/2022)   Received from [facility], [facility]  Financial Resource Strain: Medium Risk (02/09/2022)  Social Connections: Unknown (05/28/2021)   Received from [facility]  Tobacco Use: Medium Risk (07/26/2022)     Readmission Risk Interventions     No data to display

## 2022-08-02 NOTE — TOC Initial Note (Signed)
Transition of Care [facility]) - Initial/Assessment Note    Patient Details  Name: William Fischer MRN: [id number] Date of Birth: 05-21-1965  Transition [facility]) CM/SW Contact:    Leone Haven, RN Phone Number: 08/02/2022, 11:26 AM  Clinical Narrative:                 Patient is for dc today, he states his son lives with him, he has no PCP on file, and he states he does not want one, he sees a Development worker, international aid and he mentioned to [place] with [facility] the less appts the better it is for him.  He has insurance on file also.  She has transport home today.  His son is his support system,   Expected Discharge Plan: Home/Self Care Barriers to Discharge: No Barriers Identified   Patient Goals and CMS Choice Patient states their goals for this hospitalization and ongoing recovery are:: return home   Choice offered to / list presented to : NA      Expected Discharge Plan and Services In-house Referral: NA Discharge Planning Services: CM Consult Post Acute Care Choice: NA Living arrangements for the past 2 months: Single Family Home Expected Discharge Date: 08/02/22               DME Arranged: N/A DME Agency: NA       HH Arranged: NA          Prior Living Arrangements/Services Living arrangements for the past 2 months: Single Family Home Lives with:: Adult Children Patient language and need for interpreter reviewed:: Yes Do you feel safe going back to the place where you live?: Yes      Need for Family Participation in Patient Care: Yes (Comment) Care giver support system in place?: Yes (comment)   Criminal Activity/Legal Involvement Pertinent to Current Situation/Hospitalization: No - Comment as needed  Activities of Daily Living      Permission Sought/Granted Permission sought to share information with : Case Manager Permission granted to share information with : Yes, Verbal Permission Granted              Emotional Assessment   Attitude/Demeanor/Rapport:  Engaged Affect (typically observed): Appropriate Orientation: : Oriented to Self, Oriented to Place, Oriented to  Time, Oriented to Situation Alcohol / Substance Use: Not Applicable Psych Involvement: No (comment)  Admission diagnosis:  ICD (implantable cardioverter-defibrillator) in place [Z95.810] Patient Active Problem List   Diagnosis Date Noted   ICD (implantable cardioverter-defibrillator) in place 08/01/2022   A-fib (HCC) 09/02/2021   Debility 09/01/2021   Acute on chronic combined systolic (congestive) and diastolic (congestive) heart failure (HCC)    Demand ischemia    Vertigo 11/14/2017   Syncope and collapse 11/13/2017   Acute lower UTI 11/13/2017   Tick bite of calf, right, sequela 11/13/2017   Anticoagulated by anticoagulation treatment 01/17/2016   Intra-abdominal infection 11/17/2015   NICM (nonischemic cardiomyopathy) (HCC) 08/03/2015   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, recurrent 07/30/2015   Chronic atrial fibrillation    Sepsis (HCC) 07/25/2015   Prediabetes 04/16/2015   Normocytic anemia 04/16/2015   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   Abdominal abscess    Septic shock (HCC) 04/12/2015   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   Acute venous embolism and thrombosis of deep vessels of proximal lower extremity (HCC) [I82.4Y9] 02/11/2015   Long term (current) use of anticoagulants [Z79.01] 02/11/2015   Monitoring for long-term anticoagulant use 02/11/2015   Intra-abdominal abscess (HCC)  Bowel perforation (HCC)    Pressure ulcer 01/26/2015   Pleural effusion    Perforated bowel (HCC)    Diverticulitis of colon with perforation 01/14/2015   AKI (acute kidney injury) [facility])    Atrial fibrillation with rapid ventricular response (HCC) 12/08/2011   Chronic systolic heart failure (HCC) 12/08/2011   Morbid obesity (HCC) 12/03/2011   Depression with anxiety 06/29/2011   Obesity 06/29/2011   PCP:  Pcp, No Pharmacy:    [id number] - [place], [place] - [address] [place] [address] Phone: [phone] Fax: [phone]  [facility] [address] [place] [address] Phone: [phone] Fax: [phone]     Social Determinants of Health (SDOH) Social History: SDOH Screenings   Food Insecurity: Low Risk  (02/17/2022)   Received from [facility]  Transportation Needs: No Transportation Needs (02/17/2022)   Received from [facility], [facility]  Utilities: Low Risk  (02/17/2022)   [facility] from [facility], [facility]  Financial Resource Strain: Medium Risk (02/09/2022)  Social Connections: Unknown (05/28/2021)   Received from [facility]  Tobacco Use: Medium Risk (07/26/2022)   SDOH Interventions:     Readmission Risk Interventions     No data to display

## 2022-08-02 NOTE — Discharge Instructions (Signed)
After Your ICD (Implantable Cardiac Defibrillator)   You have a [employer] ICD  ACTIVITY Do not lift your arm above shoulder height for 1 week after your procedure. After 7 days, you may progress as below.  You should remove your sling 24 hours after your procedure, unless otherwise instructed by your provider.     Tuesday August 09, 2022  Wednesday August 10, 2022 Thursday August 11, 2022 Friday August 12, 2022   Do not lift, push, pull, or carry anything over 10 pounds with the affected arm until 6 weeks (Tuesday September 13, 2022 ) after your procedure.   You may drive AFTER your wound check, unless you have been told otherwise by your provider.   Ask your healthcare provider when you can go back to work   INCISION/Dressing If you are on a blood thinner such as Coumadin, Xarelto, Eliquis, Plavix, or Pradaxa please confirm with your provider when this should be resumed.   Resume your Xarelto on 08/07/22  Monitor your defibrillator site for redness, swelling, and drainage. Call the device clinic at [phone] if you experience these symptoms or fever/chills.  If your incision is sealed with Steri-strips or staples, you may shower 7 days after your procedure or when told by your provider. Do not remove the steri-strips or let the shower hit directly on your site. You may wash around your site with soap and water.    Avoid lotions, ointments, or perfumes over your incision until it is well-healed.  You may use a hot tub or a pool AFTER your wound check appointment if the incision is completely closed.  Your ICD is designed to protect you from life threatening heart rhythms. Because of this, you may receive a shock.   1 shock with no symptoms:  Call the office during business hours. 1 shock with symptoms (chest pain, chest pressure, dizziness, lightheadedness, shortness of breath, overall feeling unwell):  Call 911. If you experience 2 or more shocks in 24 hours:  Call 911. If you receive a  shock, you should not drive for 6 months per the [place] DMV IF you receive appropriate therapy from your ICD.   ICD Alerts:  Some alerts are vibratory and others beep. These are NOT emergencies. Please call our office to let us know. If this occurs at night or on weekends, it can wait until the next business day. Send a remote transmission.  If your device is capable of reading fluid status (for heart failure), you will be offered monthly monitoring to review this with you.   DEVICE MANAGEMENT Remote monitoring is used to monitor your ICD from home. This monitoring is scheduled every 91 days by our office. It allows [place] to keep an eye on the functioning of your device to ensure it is working properly. You will routinely see your Electrophysiologist annually (more often if necessary).   You should receive your ID card for your new device in 4-8 weeks. Keep this card with you at all times once received. Consider wearing a medical alert bracelet or necklace.  Your ICD  may be MRI compatible. This will be discussed at your next office visit/wound check.  You should avoid contact with strong electric or magnetic fields.   Do not use amateur (ham) radio equipment or electric (arc) welding torches. MP3 player headphones with magnets should not be used. Some devices are safe to use if held at least 12 inches (30 cm) from your defibrillator. These include power tools, lawn mowers, and speakers.  If you are unsure if something is safe to use, ask your health care provider.  When using your cell phone, hold it to the ear that is on the opposite side from the defibrillator. Do not leave your cell phone in a pocket over the defibrillator.  You may safely use electric blankets, heating pads, computers, and microwave ovens.  Call the office right away if: You have chest pain. You feel more than one shock. You feel more short of breath than you have felt before. You feel more light-headed than you have felt  before. Your incision starts to open up.  This information is not intended to replace advice given to you by your health care provider. Make sure you discuss any questions you have with your health care provider.

## 2022-08-02 NOTE — Consult Note (Signed)
   [facility] Inpatient Consult   08/02/2022  William Fischer Jul 17, 1965 [id number]  [facility] Referral:  Patient showing on [facility] banner, unit secretary attempting to make PCP appointment  Primary Care Provider: None  This patient has a cardiology specialist with [facility]  Insurance: [name] is listed and endorsed by patient  Met with the patient at the bedside, following unit progression for patient for transitioning home. Met with patient in room, up in transport chair for home.  Patient endorses, NOT having a PCP states, "I follow up with cardiologist only, I don't need a PCP because the less appointments I have the better for me."  Patient was stern but generous. Explained potential need for PCP however patient declines information on receiving a PCP.  Patient states no needs for [facility] at this time.   Reason:  Not a beneficiary currently attributed to one of the [facility] ACO Registry populations.  Patient also had an insurance change from [place] to [place] noted Patient does not have primary care provider is not an Therapist, sports provider with [employer] at this time.  Patient with a new ICD placed wants only cardiology for needs. Membership roster was used to verify patient status.  Will sign off.     For questions, please call:  Charlesetta Shanks, RN BSN CCM [facility]  [phone] business mobile phone Toll free office [phone]  *Concierge Line  [phone] Fax number: [phone] [name]

## 2022-08-02 NOTE — Progress Notes (Signed)
Pt is anxious to Discharge dressed and son waiting outside does not want a PCP appt, with Tele removed, d/c paper work given Engineer, petroleum removed IV.

## 2022-08-15 ENCOUNTER — Observation Stay (HOSPITAL_COMMUNITY): Payer: Medicaid Other

## 2022-08-15 ENCOUNTER — Emergency Department (HOSPITAL_COMMUNITY): Payer: Medicaid Other

## 2022-08-15 ENCOUNTER — Other Ambulatory Visit: Payer: Self-pay

## 2022-08-15 ENCOUNTER — Observation Stay (HOSPITAL_COMMUNITY)
Admission: EM | Admit: 2022-08-15 | Discharge: 2022-08-15 | Disposition: A | Discharge status: Home / Self Care | Payer: Medicaid Other | Source: Home / Self Care | Attending: Internal Medicine | Admitting: Internal Medicine

## 2022-08-15 ENCOUNTER — Encounter (HOSPITAL_COMMUNITY): Payer: Self-pay

## 2022-08-15 ENCOUNTER — Observation Stay (HOSPITAL_BASED_OUTPATIENT_CLINIC_OR_DEPARTMENT_OTHER): Payer: Medicaid Other

## 2022-08-15 DIAGNOSIS — I482 Chronic atrial fibrillation, unspecified: Secondary | ICD-10-CM | POA: Insufficient documentation

## 2022-08-15 DIAGNOSIS — Z87891 Personal history of nicotine dependence: Secondary | ICD-10-CM | POA: Diagnosis not present

## 2022-08-15 DIAGNOSIS — R079 Chest pain, unspecified: Secondary | ICD-10-CM | POA: Diagnosis present

## 2022-08-15 DIAGNOSIS — M7989 Other specified soft tissue disorders: Secondary | ICD-10-CM

## 2022-08-15 DIAGNOSIS — I110 Hypertensive heart disease with heart failure: Secondary | ICD-10-CM | POA: Diagnosis not present

## 2022-08-15 DIAGNOSIS — R7303 Prediabetes: Secondary | ICD-10-CM

## 2022-08-15 DIAGNOSIS — R0602 Shortness of breath: Secondary | ICD-10-CM | POA: Diagnosis not present

## 2022-08-15 DIAGNOSIS — Z95810 Presence of automatic (implantable) cardiac defibrillator: Secondary | ICD-10-CM | POA: Diagnosis not present

## 2022-08-15 DIAGNOSIS — I517 Cardiomegaly: Secondary | ICD-10-CM | POA: Diagnosis not present

## 2022-08-15 DIAGNOSIS — I10 Essential (primary) hypertension: Secondary | ICD-10-CM | POA: Diagnosis present

## 2022-08-15 DIAGNOSIS — I5043 Acute on chronic combined systolic (congestive) and diastolic (congestive) heart failure: Secondary | ICD-10-CM | POA: Diagnosis present

## 2022-08-15 DIAGNOSIS — I5023 Acute on chronic systolic (congestive) heart failure: Secondary | ICD-10-CM

## 2022-08-15 DIAGNOSIS — R918 Other nonspecific abnormal finding of lung field: Secondary | ICD-10-CM | POA: Diagnosis not present

## 2022-08-15 DIAGNOSIS — I509 Heart failure, unspecified: Secondary | ICD-10-CM | POA: Diagnosis not present

## 2022-08-15 DIAGNOSIS — Z79899 Other long term (current) drug therapy: Secondary | ICD-10-CM | POA: Diagnosis not present

## 2022-08-15 DIAGNOSIS — R0789 Other chest pain: Principal | ICD-10-CM

## 2022-08-15 DIAGNOSIS — E785 Hyperlipidemia, unspecified: Secondary | ICD-10-CM | POA: Diagnosis present

## 2022-08-15 LAB — BASIC METABOLIC PANEL
Anion gap: 10 (ref 5–15)
BUN: 13 mg/dL (ref 6–20)
CO2: 24 mmol/L (ref 22–32)
Calcium: 9.2 mg/dL (ref 8.9–10.3)
Chloride: 101 mmol/L (ref 98–111)
Creatinine, Ser: 0.95 mg/dL (ref 0.61–1.24)
GFR, Estimated: >60 mL/min (ref >60)
Glucose, Bld: 145 mg/dL — ABNORMAL HIGH (ref 70–99)
Potassium: 3.6 mmol/L (ref 3.5–5.1)
Sodium: 135 mmol/L (ref 135–145)

## 2022-08-15 LAB — TROPONIN I (HIGH SENSITIVITY): Troponin I (High Sensitivity): 13 ng/L (ref <18)

## 2022-08-15 LAB — CBC
HCT: 47.1 % (ref 39.0–52.0)
Hemoglobin: 15.5 g/dL (ref 13.0–17.0)
MCH: 31 pg (ref 26.0–34.0)
MCHC: 32.9 g/dL (ref 30.0–36.0)
MCV: 94.2 fL (ref 80.0–100.0)
Platelets: 242 10*3/uL (ref 150–400)
RBC: 5 MIL/uL (ref 4.22–5.81)
RDW: 14.8 % (ref 11.5–15.5)
WBC: 9.4 10*3/uL (ref 4.0–10.5)
nRBC: 0 % (ref 0.0–0.2)

## 2022-08-15 LAB — CBG MONITORING, ED
Glucose-Capillary: 161 mg/dL — ABNORMAL HIGH (ref 70–99)
Glucose-Capillary: 189 mg/dL — ABNORMAL HIGH (ref 70–99)

## 2022-08-15 LAB — PHOSPHORUS: Phosphorus: 4.6 mg/dL (ref 2.5–4.6)

## 2022-08-15 LAB — MAGNESIUM: Magnesium: 2.1 mg/dL (ref 1.7–2.4)

## 2022-08-15 LAB — BRAIN NATRIURETIC PEPTIDE: B Natriuretic Peptide: 140.3 pg/mL — ABNORMAL HIGH (ref 0.0–100.0)

## 2022-08-15 MED ORDER — ACETAMINOPHEN 325 MG PO TABS: 650.0000 mg | ORAL_TABLET | Freq: Four times a day (QID) | ORAL | Status: DC | PRN

## 2022-08-15 MED ORDER — ENOXAPARIN SODIUM 150 MG/ML IJ SOSY
1.0000 mg/kg | PREFILLED_SYRINGE | Freq: Two times a day (BID) | INTRAMUSCULAR | Status: DC
  Administered 2022-08-15: 135 mg via SUBCUTANEOUS
  Filled 2022-08-15: qty 0.9

## 2022-08-15 MED ORDER — OXYCODONE HCL 5 MG PO TABS
5.0000 mg | ORAL_TABLET | Freq: Once | ORAL | Status: AC
  Administered 2022-08-15: 5 mg via ORAL
  Filled 2022-08-15: qty 1

## 2022-08-15 MED ORDER — SACUBITRIL-VALSARTAN 24-26 MG PO TABS: 1.0000 | ORAL_TABLET | Freq: Two times a day (BID) | ORAL | Status: DC

## 2022-08-15 MED ORDER — ONDANSETRON HCL 4 MG/2ML IJ SOLN: 4.0000 mg | Freq: Four times a day (QID) | INTRAMUSCULAR | Status: DC | PRN

## 2022-08-15 MED ORDER — ONDANSETRON HCL 4 MG PO TABS: 4.0000 mg | ORAL_TABLET | Freq: Four times a day (QID) | ORAL | Status: DC | PRN

## 2022-08-15 MED ORDER — OXYCODONE HCL 5 MG PO TABS: 5.0000 mg | ORAL_TABLET | ORAL | Status: DC | PRN

## 2022-08-15 MED ORDER — SPIRONOLACTONE 25 MG PO TABS: 25.0000 mg | ORAL_TABLET | Freq: Every day | ORAL | Status: DC

## 2022-08-15 MED ORDER — FUROSEMIDE 10 MG/ML IJ SOLN
40.0000 mg | Freq: Once | INTRAMUSCULAR | Status: AC
  Administered 2022-08-15: 40 mg via INTRAVENOUS
  Filled 2022-08-15: qty 4

## 2022-08-15 MED ORDER — METOPROLOL SUCCINATE ER 50 MG PO TB24: 75.0000 mg | ORAL_TABLET | Freq: Two times a day (BID) | ORAL | Status: DC

## 2022-08-15 MED ORDER — HYDROMORPHONE HCL 1 MG/ML IJ SOLN
1.0000 mg | Freq: Once | INTRAMUSCULAR | Status: AC
  Administered 2022-08-15: 1 mg via INTRAVENOUS
  Filled 2022-08-15: qty 1

## 2022-08-15 MED ORDER — DAPAGLIFLOZIN PROPANEDIOL 10 MG PO TABS: 10.0000 mg | ORAL_TABLET | Freq: Every day | ORAL | Status: DC

## 2022-08-15 MED ORDER — PROCHLORPERAZINE EDISYLATE 10 MG/2ML IJ SOLN
10.0000 mg | Freq: Once | INTRAMUSCULAR | Status: AC
  Administered 2022-08-15: 10 mg via INTRAVENOUS
  Filled 2022-08-15: qty 2

## 2022-08-15 MED ORDER — ROSUVASTATIN CALCIUM 20 MG PO TABS: 20.0000 mg | ORAL_TABLET | Freq: Every day | ORAL | Status: DC

## 2022-08-15 MED ORDER — RIVAROXABAN 20 MG PO TABS: 20.0000 mg | ORAL_TABLET | Freq: Every day | ORAL | Status: DC

## 2022-08-15 MED ORDER — FUROSEMIDE 10 MG/ML IJ SOLN
20.0000 mg | Freq: Once | INTRAMUSCULAR | Status: AC
  Administered 2022-08-15: 20 mg via INTRAVENOUS
  Filled 2022-08-15: qty 4

## 2022-08-15 MED ORDER — RIVAROXABAN 20 MG PO TABS: 20.0000 mg | ORAL_TABLET | Freq: Once | ORAL | Status: DC

## 2022-08-15 MED ORDER — POTASSIUM CHLORIDE CRYS ER 20 MEQ PO TBCR
40.0000 meq | EXTENDED_RELEASE_TABLET | Freq: Once | ORAL | Status: AC
  Administered 2022-08-15: 40 meq via ORAL
  Filled 2022-08-15: qty 2

## 2022-08-15 MED ORDER — TORSEMIDE 20 MG PO TABS: 10.0000 mg | ORAL_TABLET | Freq: Every day | ORAL | Status: DC

## 2022-08-15 MED ORDER — ACETAMINOPHEN 650 MG RE SUPP: 650.0000 mg | Freq: Four times a day (QID) | RECTAL | Status: DC | PRN

## 2022-08-15 NOTE — ED Notes (Signed)
Pt. Was able to ambulate Pulse 95 and 02 94 %

## 2022-08-15 NOTE — H&P (Signed)
History and Physical    Patient: William Fischer VZD:[id number] DOB: 09-13-1965 DOA: 08/15/2022 DOS: the patient was seen and examined on 08/15/2022 PCP: Pcp, No  Patient coming from: Home  Chief Complaint:  Chief Complaint  Patient presents with   Chest Pain   HPI: William Fischer is a 57 y.o. male with medical history significant of IVP dye allergy, varicella-zoster, depression, anxiety, mesenteric embolus, diverticulosis, perforated diverticulitis, history of C. difficile colitis, intra-abdominal abscess, class III obesity, hypertension, migraine headaches, pleural effusion, possible sleep apnea, abdominal abscess, history of UTI, history of AKI, chronic atrial fibrillation,Dilated cardiomyopathy, chronic combined systolic and diastolic heart failure, status post ICD placement 2 weeks ago who came into the emergency department dilated cardiomyopathy, chronic combined systolic and diastolic heart failure, status post ICD placement 2 weeks ago who came into the emergency department for left upper chest area chest pain and dyspnea for the past 2 days.  Chest pain radiates to his left shoulder and arm.  He also complains of right thigh pain and numbness. He has not been taking his Xarelto.  Denied fever, chills, rhinorrhea, sore throat, wheezing or hemoptysis. No abdominal pain, nausea, emesis, diarrhea, constipation, melena or hematochezia.  No flank pain, dysuria, frequency or hematuria.  No polyuria, polydipsia, polyphagia or blurred vision.   Lab work: CBC was normal, BMP with a glucose of 145 mg/dL, but otherwise unremarkable.  First troponin, magnesium and phosphorus were normal.  BNP 140.3 pg/mL.  Imaging: 2 view chest radiograph is showing cardiomegaly with mildly distended pulmonary vasculature.  Mild atelectasis, edema or infiltrate at the lung bases.  ED course: Initial vital signs were temperature 98 F, pulse 98, respiration 20, BP 165/93 mmHg O2 sat 90% on room air.  The patient  received furosemide 20 mg IVP.  I added hydromorphone 1 mg p.o., oxycodone 5 mg p.o., prochlorperazine 10 mg IVP and KCl 40 mEq p.o.   Review of Systems: As mentioned in the history of present illness. All other systems reviewed and are negative. Past Medical History:  Diagnosis Date   Allergy to IVP dye    Atrial fibrillation (HCC)    admx 11/13 with acute sCHF in setting of RVR  => a. failed DCCV x 2; b. Pradaxa started;  c. failed sotalol   Chicken pox as a child   Chronic anticoagulation    Pradaxa   Chronic systolic heart failure (HCC)    a. echo 11/13: Ef 40-45%, diff HK, mod MR, mod LAE, mild RVE, mod RAE, small effusion;   b. 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   Migraine 06/29/2011   Obesity 06/29/2011   Snoring    patient needs sleep study - has declined   Past Surgical History:  Procedure Laterality Date   BIV ICD INSERTION CRT-D N/A 08/01/2022   Procedure: BIV ICD INSERTION CRT-D;  Surgeon: Lanier Prude, MD;  Location: [facility] INVASIVE CV LAB;  Service: Cardiovascular;  Laterality: N/A;   CARDIOVERSION  11/30/2011   Procedure: CARDIOVERSION;  Surgeon: Vesta Mixer, MD;  Location: [facility] ENDOSCOPY;  Service: Cardiovascular;  Laterality: N/A;   CARDIOVERSION  12/03/2011   Procedure: CARDIOVERSION;  Surgeon: Dolores Patty, MD;  Location[facility] OR;  Service: Cardiovascular;  Laterality: N/A;   COLECTOMY  12/07/2015   partial   COLON SURGERY     COLOSTOMY REVERSAL  12/07/2015  COLOSTOMY REVERSAL N/A 12/07/2015   Procedure: OPEN REVERSAL OF COLOSTOMY WITH PARTIAL SIGMOID COLECTOMY;  Surgeon: Romie Levee, MD;  Location: [facility] OR;  Service: General;  Laterality: N/A;   HERNIA REPAIR     INCISIONAL HERNIA REPAIR  12/07/2015   INCISIONAL HERNIA REPAIR N/A 12/07/2015   Procedure: INCISIONAL HERNIA REPAIR;  Surgeon: Romie Levee, MD;  Location: [facility] OR;  Service:  General;  Laterality: N/A;   IR GENERIC HISTORICAL  08/11/2015   IR RADIOLOGIST EVAL & MGMT 08/11/2015 Richarda Overlie, MD [facility] INTERV RAD   IR GENERIC HISTORICAL  11/18/2015   IR CATHETER TUBE CHANGE 11/18/2015 Gilmer Mor, DO [facility]   IR GENERIC HISTORICAL  10/15/2015   IR RADIOLOGIST EVAL & MGMT 10/15/2015 Malachy Moan, MD [facility] INTERV RAD   IR GENERIC HISTORICAL  01/27/2016   IR RADIOLOGIST EVAL & MGMT 01/27/2016 Brayton El, PA-C [facility] INTERV RAD   IR GENERIC HISTORICAL  01/28/2016   IR CATHETER TUBE CHANGE 01/28/2016 Richarda Overlie, MD [facility]   IR GENERIC HISTORICAL  02/17/2016   IR RADIOLOGIST EVAL & MGMT 02/17/2016 Brayton El, PA-C [facility] INTERV RAD   LAPAROTOMY N/A 01/17/2015   Procedure: EXPLORATORY LAPAROTOMY WITH LEFT COLECTOMY AND COLOSTOMY;  Surgeon: Emelia Loron, MD;  Location: [facility] OR;  Service: General;  Laterality: N/A;   PERIPHERALLY INSERTED CENTRAL CATHETER INSERTION  11/2015   RIGHT/LEFT HEART CATH AND CORONARY ANGIOGRAPHY N/A 01/12/2022   Procedure: RIGHT/LEFT HEART CATH AND CORONARY ANGIOGRAPHY;  Surgeon: Tonny Bollman, MD;  Location[facility] INVASIVE CV LAB;  Service: Cardiovascular;  Laterality: N/A;   TEE WITHOUT CARDIOVERSION  11/30/2011   Procedure: TRANSESOPHAGEAL ECHOCARDIOGRAM (TEE);  Surgeon: Vesta Mixer, MD;  Location: [facility] ENDOSCOPY;  Service: Cardiovascular;  Laterality: N/A;   TONSILLECTOMY     Social History:  reports that he quit smoking about 8 years ago. His smoking use included cigarettes. He started smoking about 28 years ago. He has a 20 pack-year smoking history. He has never used smokeless tobacco. He reports that he does not drink alcohol and does not use drugs.  Allergies  Allergen Reactions   Contrast Media [Iodinated Contrast Media] Rash    a diffuse macular rash after CTA chest  Pt was premedicated with 125mg  IV Solumedrol, 50mg  IV Benadryl 1 hr prior to CTexam, and tolerated procedure without any difficulties. 11/28/11 Also same  pre med protocol observed on 02/22/15 and pt tolerated the procedure well   Tramadol     Family History  Problem Relation Age of Onset   Hypertension Mother    Hypertension Father    Aneurysm Father    Alcohol abuse Father    Cancer Maternal Grandmother        brain   Diabetes Maternal Grandfather        type 2   Parkinsonism Maternal Grandfather    Cancer Paternal Grandmother        breast   Diabetes Paternal Grandmother    Alcohol abuse Paternal Grandfather     Prior to Admission medications   Medication Sig Start Date End Date Taking? Authorizing Provider  acetaminophen (TYLENOL) 500 MG tablet Take 1,000 mg by mouth every 6 (six) hours as needed (pain).   Yes [provider]  b complex vitamins capsule Take 1 capsule by mouth daily.   Yes [provider]  dapagliflozin propanediol (FARXIGA) 10 MG TABS tablet Take 1 tablet (10 mg total) by mouth daily. 11/22/21  Yes Little Ishikawa, MD  metoprolol succinate (TOPROL-XL) 25 MG  24 hr tablet Take 3 tablets (75 mg total) by mouth 2 (two) times daily. Take with or immediately following a meal. 08/02/22  Yes Sheilah Pigeon, PA-C  Multiple Vitamins-Minerals (ONE-A-DAY 50 PLUS PO) Take 1 capsule by mouth daily in the afternoon.   Yes [provider]  rivaroxaban (XARELTO) 20 MG TABS tablet TAKE 1 TABLET BY MOUTH DAILY WITH SUPPER. 01/31/22  Yes Little Ishikawa, MD  rosuvastatin (CRESTOR) 20 MG tablet Take 1 tablet (20 mg total) by mouth daily. 11/23/21 11/18/22 Yes Little Ishikawa, MD  sacubitril-valsartan (ENTRESTO) 24-26 MG Take 1 tablet by mouth 2 (two) times daily. 02/08/22  Yes Little Ishikawa, MD  spironolactone (ALDACTONE) 25 MG tablet Take 1 tablet (25 mg total) by mouth daily. 09/22/21  Yes Little Ishikawa, MD  torsemide (DEMADEX) 10 MG tablet Take 1 tablet (10 mg total) by mouth daily. 09/22/21  Yes Little Ishikawa, MD  diphenhydrAMINE (BENADRYL) 50 MG capsule Take  1 tablet (50 mg) prior to going to the hospital for your procedure Patient not taking: Reported on 08/15/2022 06/29/22   Lanier Prude, MD  doxycycline (VIBRAMYCIN) 100 MG capsule Take 1 capsule (100 mg total) by mouth 2 (two) times daily. Patient not taking: Reported on 08/15/2022 07/26/22   Geoffery Lyons, MD  predniSONE (DELTASONE) 50 MG tablet Take 1 tablet 13 hours prior to procedure, 1 tablet 7 hours prior to procedure, and 1 tablet (with Benedryl 50 mg) prior to going to the hospital for procedure Patient not taking: Reported on 08/15/2022 06/29/22   Lanier Prude, MD    Physical Exam: Vitals:   08/15/22 0309 08/15/22 0819  BP: (!) 165/93 (!) 127/90  Pulse: 98 84  Resp: 20 16  Temp: 98 F (36.7 C) 98.1 F (36.7 C)  TempSrc: Oral Oral  SpO2: 90% 95%   Physical Exam Vitals and nursing note reviewed.  Constitutional:      General: He is awake. He is not in acute distress.    Appearance: He is well-developed. He is obese.  HENT:     Head: Normocephalic.     Nose: No rhinorrhea.     Mouth/Throat:     Mouth: Mucous membranes are moist.  Eyes:     General: No scleral icterus.    Pupils: Pupils are equal, round, and reactive to light.  Neck:     Vascular: No JVD.  Cardiovascular:     Rate and Rhythm: Normal rate and regular rhythm.     Heart sounds: S1 normal and S2 normal.  Pulmonary:     Effort: Pulmonary effort is normal. No tachypnea or accessory muscle usage.     Breath sounds: No wheezing, rhonchi or rales.  Abdominal:     General: Abdomen is protuberant. Bowel sounds are normal. There is no distension.     Palpations: Abdomen is soft.     Tenderness: There is no abdominal tenderness. There is no right CVA tenderness or left CVA tenderness.  Musculoskeletal:     Cervical back: Neck supple.     Right lower leg: No edema.     Left lower leg: No edema.  Skin:    General: Skin is warm and dry.  Neurological:     General: No focal deficit present.     Mental  Status: He is alert and oriented to person, place, and time.  Psychiatric:        Mood and Affect: Mood normal.  Behavior: Behavior normal. Behavior is cooperative.     Data Reviewed:  Results are pending, will review when available.  cMRI 05/2022: IMPRESSION: 1. Mild LV dilatation with moderate systolic dysfunction (EF 33%). Global hypokinesis with focal area of thinning/akinesis in apical inferior wall   2.  Normal RV size and systolic function (EF 49%)   3. Basal septal midwall LGE, which is a scar pattern seen in nonischemic cardiomyopathies and associated with worse prognosis   4. RV insertion site LGE, which is a nonspecific scar pattern often seen in setting of elevated pulmonary pressures   5. Subendocardial LGE in apical inferior wall, suggesting small infarct. Given normal coronary arteries on recent cath, could represent embolic event. Would also consider sarcoid on the differential, as can cause both midwall and subendocardial LGE. Recommend FDG-PET to evaluate for sarcoid.   6.  Left lung opacity, recommend CT chest for further evaluation   Transthoracic echo 02/22/22:  1. Left ventricular ejection fraction, by estimation, is 35 to 40%. The  left ventricle has moderately decreased function. The left ventricle  demonstrates global hypokinesis. Left ventricular diastolic function could  not be evaluated.   2. Right ventricular systolic function is normal. The right ventricular  size is mildly enlarged. There is normal pulmonary artery systolic  pressure. The estimated right ventricular systolic pressure is 29.4 mmHg.   3. Left atrial size was moderately dilated.   4. Right atrial size was severely dilated.   5. The mitral valve is abnormal. Moderate to severe mitral valve  regurgitation.   6. The aortic valve is tricuspid. Aortic valve regurgitation is not  visualized.   7. The inferior vena cava is normal in size with greater than 50%  respiratory  variability, suggesting right atrial pressure of 3 mmHg.      Right and left heart catheterization 12/2021: Angiographically normal coronary arteries Mildly elevated intracardiac filling pressures with CO 5 L/min and CI 2 L/min/square meter  EKG: Vent. rate 93 BPM PR interval * ms QRS duration 108 ms QT/QTcB 379/477 ms P-R-T axes * 107 32 Atrial fibrillation Anterior infarct, old  Assessment and Plan: Principal Problem:   Acute on chronic combined systolic  (congestive) and diastolic (congestive) heart failure (HCC) Positive dyspnea, but does not seem in very volume overloaded. Observation/telemetry. Supplemental oxygen as needed. Sodium and fluid restriction. Continue Farxiga 10 mg p.o. daily. Continue furosemide IVP PER cardiology. Continue Entresto 24/26 mg p.o. twice daily. Continue metoprolol succinate 75 mg p.o. twice daily. Continue spironolactone 25 mg p.o. daily. Resume torsemide 10 mg p.o. daily tomorrow. Monitor daily weights, intake and output. Monitor renal function electrolytes. Check echocardiogram. Cardiology consult appreciated. Will follow the recommendations.  Active Problems:   Chest pain Secondary to:   ICD (implantable cardioverter-defibrillator) in place Seems to be from his ICD site. Troponin level was normal. Analgesics as needed.    Prediabetes Last HbA1c 10.9% on 02/22/2022. Carbohydrate modified diet. On Farxiga as above. CBG monitoring AC/HS. Beginning RI SS as needed. Check hemoglobin A1c.    Chronic atrial fibrillation CHA2DS2-VASc Score of 4. Off anticoagulation. Continue metoprolol for rate control.    Essential hypertension Continue metoprolol, Entresto and diuretics as above.    Hyperlipidemia Continue rosuvastatin 20 mg p.o. daily.     Advance Care Planning:   Code Status: Full Code   Consults: Cardiology ().  Family Communication:   Severity of Illness: The appropriate patient status for this patient is  OBSERVATION. Observation status is judged to be reasonable and necessary  in order to provide the required intensity of service to ensure the patient's safety. The patient's presenting symptoms, physical exam findings, and initial radiographic and laboratory data in the context of their medical condition is felt to place them at decreased risk for further clinical deterioration. Furthermore, it is anticipated that the patient will be medically stable for discharge from the hospital within 2 midnights of admission.   Author: Bobette Mo, MD 08/15/2022 10:44 AM  For on call review [email or web address].   This document was prepared using Dragon voice recognition software and may contain some unintended transcription errors.

## 2022-08-15 NOTE — ED Triage Notes (Signed)
Pt. Arrives POV for chest pain and SOB x2 DAYS. Pt. States that his pain radiates down his L. Shoulder. Pt. Recently had a pacemaker placed and states that that is what is causing his shoulder to hurt. He also c/o numbness to the R. Thigh.

## 2022-08-15 NOTE — Consult Note (Signed)
Cardiology Consultation   Patient ID: WRAITH RUNSER MRN: [id number]; DOB: [birth year]  Admit date: 08/15/2022 Date of Consult: 08/15/2022  PCP:  Oneita Hurt No   [facility] Providers Cardiologist:  Little Ishikawa, MD  Electrophysiologist:  Lanier Prude, MD       Patient Profile:   William Fischer is a 57 y.o. male with a hx of chronic systolic and diastolic heart failure, NICM, normal coronaries by heart catheterization, hypertension, persistent atrial fibrillation, chronic anticoagulation, mesenteric embolus, colonic perforation due to diverticulitis s/p hemicolectomy and colostomy 2016 that was later reversed, OSA, and former tobacco abuse who is being seen 08/15/2022 for the evaluation of CHF exacerbation at the request of Dr. Robb Matar.  History of Present Illness:   William Fischer has a history of chronic combined systolic and diastolic heart failure secondary to nonischemic cardiomyopathy, moderate to severe MR, atrial fibrillation, hypertension, mesenteric embolus, colonic perforation in 2016 due to diverticulitis s/p hemicolectomy and colostomy that was later reversed, former tobacco abuse, and OSA.    He was seen by Dr. Johney Frame 11/2027 felt to have likely permanent atrial fibrillation.  He subsequently underwent ablation at [facility] 03/2019 and started on amiodarone.  He maintained sinus rhythm for 4 to 5 months but reverted back to atrial fibrillation.  He was followed by the advanced heart failure clinic at [facility], last seen 12/2020.  He was at that time maintained on 100 mg amiodarone, 100 mg Toprol, 10 mg rosuvastatin, 25 mg spironolactone, 10 mg torsemide, 2 mg Xarelto, 24-26 mg Entresto twice daily.  He was hospitalized 08/2021 with acute on chronic combined heart failure.  Echocardiogram 12/2020 showing EF 45%.  Echocardiogram 08/2021 showed an LVEF of 35-40%, mild RV systolic dysfunction, RVSP 52 mmHg, severe LAE, moderate RAE, and no significant valvular disease.   He left AMA prior to completion of IV diuresis.  He stopped taking all of his medications for 2 months due to affordability.  Heart monitor 12/22/2021 showed 11 episodes of NSVT, 100% A-fib burden with average heart rate 100 bpm, frequent PVCs (9.3%).  He underwent right and left heart catheterization 01/12/2022 that showed normal coronaries and mildly elevated filling pressures.  Echocardiogram 02/22/2022 showed persistent EF 35-40%, normal RV function, severe RAE, moderate LAE, moderate to severe mitral regurgitation.  Given ongoing systolic heart failure despite medical therapy, he underwent cardiac MRI that showed EF 33%, normal RV size and systolic function, basal septal mid wall LGE with a scar pattern seen in nonischemic cardiomyopathies, and subendocardial LGE in the apical inferior wall suggesting small infarct.  However given normal coronary arteries could represent an embolic event.  Would also consider sarcoidosis in the differential.  A PET scan was recommended to rule out sarcoid.  There is also left lung opacity noted with recommendations for follow-up CT chest without contrast.  Of note the patient has repeatedly refused CT chest.  He was evaluated by our EP team and underwent CRT-D placement on 08/01/2022.  He was discharged the following day.  He presented to [facility] ED with right thigh pain and numbness.  He also reported chest discomfort x 2 days at the site of his pacemaker, worse with palpation and movement.  He reported shortness of breath and DOE, cannot take more than 2 steps.  He was admitted and cardiology consulted.  HR 84, BP 127/90, O2 95% on room air O2 94% with ambulation BNP 140 HS troponin 13, delta pending EKG Afib VR 93, anterior Q waves CXR  negative for PTX, mild atelectasis, edema, or infiltrate at the lung bases.  LE [place] pending to rule out DVT since he held [facility] for ICD implant  Pt states his SOB, DOE, and lower extremity pain started on the same day. He does report  chest pain in one location just below his ICD site, not exertional. He reports DOE with little activity and right lateral thigh pain. No lower extremity edema. He does report left arm pain right after device implantation but this has improved.   Past Medical History:  Diagnosis Date   Allergy to IVP dye    Atrial fibrillation (HCC)    admx 11/13 with acute sCHF in setting of RVR  => a. failed DCCV x 2; b. Pradaxa started;  c. failed sotalol   Chicken pox as a child   Chronic anticoagulation    Pradaxa   Chronic systolic heart failure (HCC)    a. echo 11/13: Ef 40-45%, diff HK, mod MR, mod LAE, mild RVE, mod RAE, small effusion;   b. 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   Migraine 06/29/2011   Obesity 06/29/2011   Snoring    patient needs sleep study - has declined    Past Surgical History:  Procedure Laterality Date   BIV ICD INSERTION CRT-D N/A 08/01/2022   Procedure: BIV ICD INSERTION CRT-D;  Surgeon: Lanier Prude, MD;  Location: [facility] INVASIVE CV LAB;  Service: Cardiovascular;  Laterality: N/A;   CARDIOVERSION  11/30/2011   Procedure: CARDIOVERSION;  Surgeon: Vesta Mixer, MD;  Location: [facility] ENDOSCOPY;  Service: Cardiovascular;  Laterality: N/A;   CARDIOVERSION  12/03/2011   Procedure: CARDIOVERSION;  Surgeon: Dolores Patty, MD;  Location: [facility] OR;  Service: Cardiovascular;  Laterality: N/A;   COLECTOMY  12/07/2015   partial   COLON SURGERY     COLOSTOMY REVERSAL  12/07/2015   COLOSTOMY REVERSAL N/A 12/07/2015   Procedure: OPEN REVERSAL OF COLOSTOMY WITH PARTIAL SIGMOID COLECTOMY;  Surgeon: Romie Levee, MD;  Location: [facility] OR;  Service: General;  Laterality: N/A;   HERNIA REPAIR     INCISIONAL HERNIA REPAIR  12/07/2015   INCISIONAL HERNIA REPAIR N/A 12/07/2015   Procedure: INCISIONAL HERNIA REPAIR;  Surgeon: Romie Levee, MD;  Location: [facility] OR;  Service:  General;  Laterality: N/A;   IR GENERIC HISTORICAL  08/11/2015   IR RADIOLOGIST EVAL & MGMT 08/11/2015 Richarda Overlie, MD [facility] INTERV RAD   IR GENERIC HISTORICAL  11/18/2015   IR CATHETER TUBE CHANGE 11/18/2015 Gilmer Mor, DO [facility]   IR GENERIC HISTORICAL  10/15/2015   IR RADIOLOGIST EVAL & MGMT 10/15/2015 Malachy Moan, MD [facility] INTERV RAD   IR GENERIC HISTORICAL  01/27/2016   IR RADIOLOGIST EVAL & MGMT 01/27/2016 Brayton El, PA-C [facility] INTERV RAD   IR GENERIC HISTORICAL  01/28/2016   IR CATHETER TUBE CHANGE 01/28/2016 Richarda Overlie, MD [facility]   IR GENERIC HISTORICAL  02/17/2016   IR RADIOLOGIST EVAL & MGMT 02/17/2016 Brayton El, PA-C [facility] INTERV RAD   LAPAROTOMY N/A 01/17/2015   Procedure: EXPLORATORY LAPAROTOMY WITH LEFT COLECTOMY AND COLOSTOMY;  Surgeon: Emelia Loron, MD;  Location: [facility] OR;  Service: General;  Laterality: N/A;   PERIPHERALLY INSERTED CENTRAL CATHETER INSERTION  11/2015   RIGHT/LEFT HEART CATH AND CORONARY ANGIOGRAPHY N/A 01/12/2022   Procedure: RIGHT/LEFT HEART CATH AND CORONARY ANGIOGRAPHY;  Surgeon: Tonny Bollman, MD;  Location: [facility] INVASIVE CV LAB;  Service: Cardiovascular;  Laterality: N/A;   TEE WITHOUT CARDIOVERSION  11/30/2011   Procedure: TRANSESOPHAGEAL ECHOCARDIOGRAM (TEE);  Surgeon: Vesta Mixer, MD;  Location: [facility] ENDOSCOPY;  Service: Cardiovascular;  Laterality: N/A;   TONSILLECTOMY       Home Medications:  Prior to Admission medications   Medication Sig Start Date End Date Taking? Authorizing Provider  acetaminophen (TYLENOL) 500 MG tablet Take 1,000 mg by mouth every 6 (six) hours as needed (pain).   Yes [provider]  b complex vitamins capsule Take 1 capsule by mouth daily.   Yes [provider]  dapagliflozin propanediol (FARXIGA) 10 MG TABS tablet Take 1 tablet (10 mg total) by mouth daily. 11/22/21  Yes Little Ishikawa, MD  metoprolol succinate (TOPROL-XL) 25 MG 24 hr tablet Take 3 tablets (75 mg total)  by mouth 2 (two) times daily. Take with or immediately following a meal. 08/02/22  Yes Sheilah Pigeon, PA-C  Multiple Vitamins-Minerals (ONE-A-DAY 50 PLUS PO) Take 1 capsule by mouth daily in the afternoon.   Yes [provider]  rivaroxaban (XARELTO) 20 MG TABS tablet TAKE 1 TABLET BY MOUTH DAILY WITH SUPPER. 01/31/22  Yes Little Ishikawa, MD  rosuvastatin (CRESTOR) 20 MG tablet Take 1 tablet (20 mg total) by mouth daily. 11/23/21 11/18/22 Yes Little Ishikawa, MD  sacubitril-valsartan (ENTRESTO) 24-26 MG Take 1 tablet by mouth 2 (two) times daily. 02/08/22  Yes Little Ishikawa, MD  spironolactone (ALDACTONE) 25 MG tablet Take 1 tablet (25 mg total) by mouth daily. 09/22/21  Yes Little Ishikawa, MD  torsemide (DEMADEX) 10 MG tablet Take 1 tablet (10 mg total) by mouth daily. 09/22/21  Yes Little Ishikawa, MD  diphenhydrAMINE (BENADRYL) 50 MG capsule Take 1 tablet (50 mg) prior to going to the hospital for your procedure Patient not taking: Reported on 08/15/2022 06/29/22   Lanier Prude, MD  doxycycline (VIBRAMYCIN) 100 MG capsule Take 1 capsule (100 mg total) by mouth 2 (two) times daily. Patient not taking: Reported on 08/15/2022 07/26/22   Geoffery Lyons, MD  predniSONE (DELTASONE) 50 MG tablet Take 1 tablet 13 hours prior to procedure, 1 tablet 7 hours prior to procedure, and 1 tablet (with Benedryl 50 mg) prior to going to the hospital for procedure Patient not taking: Reported on 08/15/2022 06/29/22   Lanier Prude, MD    Inpatient Medications: Scheduled Meds:  enoxaparin (LOVENOX) injection  1 mg/kg Subcutaneous Q12H   Continuous Infusions:  PRN Meds: acetaminophen **OR** acetaminophen, ondansetron **OR** ondansetron (ZOFRAN) IV  Allergies:    Allergies  Allergen Reactions   Contrast Media [Iodinated Contrast Media] Rash    a diffuse macular rash after CTA chest  Pt was premedicated with 125mg  IV Solumedrol, 50mg  IV Benadryl 1 hr prior  to CTexam, and tolerated procedure without any difficulties. 11/28/11 Also same pre med protocol observed on 02/22/15 and pt tolerated the procedure well   Tramadol     Social History:   Social History   Socioeconomic History   Marital status: Divorced    Spouse name: Not on file   Number of children: Not on file   Years of education: Not on file   Highest education level: Not on file  Occupational History   Not on file  Tobacco Use   Smoking status: Former    Current packs/day: 0.00    Average packs/day: 1 pack/day for 20.0 years (20.0 ttl pk-yrs)    Types:  Cigarettes    Start date: 07/10/1994    Quit date: 07/10/2014    Years since quitting: 8.1   Smokeless tobacco: Never  Substance and Sexual Activity   Alcohol use: No    Alcohol/week: 0.0 standard drinks of alcohol   Drug use: No   Sexual activity: Never  Other Topics Concern   Not on file  Social History Narrative   Not on file   Social Determinants of Health   Financial Resource Strain: Medium Risk (02/09/2022)   Overall Financial Resource Strain (CARDIA)    Difficulty of Paying Living Expenses: Somewhat hard  Food Insecurity: Low Risk  (02/17/2022)   [facility]   Food vital sign    Within the past 12 months, you worried that your food would run out before you got money to buy more: Never true    Within the past 12 months, the food you bought just didn't last and you didn't have money to get more: Not on file  Transportation Needs: No Transportation Needs (02/17/2022)   [facility]   Transportation    In the past 12 months, has lack of reliable transportation kept you from medical appointments, meetings, work or from getting things needed for daily living? : No  Physical Activity: Not on file  Stress: Not on file  Social Connections: Unknown (05/28/2021)   Received from [facility]   Social Network    Social Network: Not on file  Intimate Partner  Violence: Unknown (04/19/2021)   Received from [facility]   HITS    Physically Hurt: Not on file    Insult or Talk Down To: Not on file    Threaten Physical Harm: Not on file    Scream or Curse: Not on file    Family History:    Family History  Problem Relation Age of Onset   Hypertension Mother    Hypertension Father    Aneurysm Father    Alcohol abuse Father    Cancer Maternal Grandmother        brain   Diabetes Maternal Grandfather        type 2   Parkinsonism Maternal Grandfather    Cancer Paternal Grandmother        breast   Diabetes Paternal Grandmother    Alcohol abuse Paternal Grandfather      ROS:  Please see the history of present illness.   All other ROS reviewed and negative.     Physical Exam/Data:   Vitals:   08/15/22 0309 08/15/22 0819  BP: (!) 165/93 (!) 127/90  Pulse: 98 84  Resp: 20 16  Temp: 98 F (36.7 C) 98.1 F (36.7 C)  TempSrc: Oral Oral  SpO2: 90% 95%   No intake or output data in the 24 hours ending 08/15/22 0915    08/01/2022    4:44 PM 08/01/2022    9:41 AM 07/26/2022    7:57 PM  Last 3 Weights  Weight (lbs) 298 lb 15.1 oz 285 lb 294 lb  Weight (kg) 135.6 kg 129.275 kg 133.358 kg     There is no height or weight on file to calculate BMI.  General:  Well nourished, well developed, in no acute distress HEENT: normal Neck: no JVD Vascular: No carotid bruits; Distal pulses 2+ bilaterally Cardiac:  normal S1, S2; RRR; no murmur  Lungs:  clear to auscultation bilaterally, no wheezing, rhonchi or rales  Abd: soft, nontender, no hepatomegaly  Ext: no  edema Musculoskeletal:  No deformities, BUE and BLE strength normal and equal Skin: warm and dry  Neuro:  CNs 2-12 intact, no focal abnormalities noted Psych:  Normal affect   EKG:  The EKG was personally reviewed and demonstrates:  atrial fibrillation with VR 93, Q waves anterior leads Telemetry:  Telemetry was personally reviewed and demonstrates:  rate controlled Afib in the  80-90s  Relevant CV Studies:  cMRI 05/2022: IMPRESSION: 1. Mild LV dilatation with moderate systolic dysfunction (EF 33%). Global hypokinesis with focal area of thinning/akinesis in apical inferior wall   2.  Normal RV size and systolic function (EF 49%)   3. Basal septal midwall LGE, which is a scar pattern seen in nonischemic cardiomyopathies and associated with worse prognosis   4. RV insertion site LGE, which is a nonspecific scar pattern often seen in setting of elevated pulmonary pressures   5. Subendocardial LGE in apical inferior wall, suggesting small infarct. Given normal coronary arteries on recent cath, could represent embolic event. Would also consider sarcoid on the differential, as can cause both midwall and subendocardial LGE. Recommend FDG-PET to evaluate for sarcoid.   6.  Left lung opacity, recommend CT chest for further evaluation    Echo 02/22/22:  1. Left ventricular ejection fraction, by estimation, is 35 to 40%. The  left ventricle has moderately decreased function. The left ventricle  demonstrates global hypokinesis. Left ventricular diastolic function could  not be evaluated.   2. Right ventricular systolic function is normal. The right ventricular  size is mildly enlarged. There is normal pulmonary artery systolic  pressure. The estimated right ventricular systolic pressure is 29.4 mmHg.   3. Left atrial size was moderately dilated.   4. Right atrial size was severely dilated.   5. The mitral valve is abnormal. Moderate to severe mitral valve  regurgitation.   6. The aortic valve is tricuspid. Aortic valve regurgitation is not  visualized.   7. The inferior vena cava is normal in size with greater than 50%  respiratory variability, suggesting right atrial pressure of 3 mmHg.    Right and left heart catheterization 12/2021: Angiographically normal coronary arteries Mildly elevated intracardiac filling pressures with CO 5 L/min and CI 2  L/min/square meter   Recommend: continued medical therapy for CHF  Laboratory Data:  High Sensitivity Troponin:   Recent Labs  Lab 08/15/22 0336  TROPONINIHS 13     Chemistry Recent Labs  Lab 08/15/22 0336  NA 135  K 3.6  CL 101  CO2 24  GLUCOSE 145*  BUN 13  CREATININE 0.95  CALCIUM 9.2  GFRNONAA >60  ANIONGAP 10    No results for input(s): "PROT", "ALBUMIN", "AST", "ALT", "ALKPHOS", "BILITOT" in the last 168 hours. Lipids No results for input(s): "CHOL", "TRIG", "HDL", "LABVLDL", "LDLCALC", "CHOLHDL" in the last 168 hours.  Hematology Recent Labs  Lab 08/15/22 0336  WBC 9.4  RBC 5.00  HGB 15.5  HCT 47.1  MCV 94.2  MCH 31.0  MCHC 32.9  RDW 14.8  PLT 242   Thyroid No results for input(s): "TSH", "FREET4" in the last 168 hours.  BNP Recent Labs  Lab 08/15/22 0336  BNP 140.3*    DDimer No results for input(s): "DDIMER" in the last 168 hours.   Radiology/Studies:  DG Chest 2 View  Result Date: 08/15/2022 CLINICAL DATA:  Chest pain, shortness of breath. EXAM: CHEST - 2 VIEW COMPARISON:  08/02/2022. FINDINGS: Heart is enlarged and the mediastinal contour is within normal limits. There is  atherosclerotic calcification of the aorta. The pulmonary vasculature is mildly distended. Mild airspace opacities are noted at the lung bases. No effusion or pneumothorax. The right costophrenic angle is excluded from the field of view. A multi lead pacemaker device is present over the left chest. No acute osseous abnormality. IMPRESSION: 1. Cardiomegaly with mildly distended pulmonary vasculature. 2. Mild atelectasis, edema, or infiltrate at the lung bases. Electronically Signed   By: Thornell Sartorius M.D.   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, PET  scan recommended - ordered but not yet scheduled - BB, entresto, spironolactone, 10 mg torsemide - BNP 140 - has received 20 mg IV lasix - no I&Os charted - has not received GDMT CHF medications yet, BP on the softer side - can challenge with 40 mg IV lasix OTO - does not appear extremely volume up - will obtain echocardiogram to rule out pericardial effusion and EF  - may need CTA chest if echo unchanged   Chest pain - describes chest discomfort at the site of his ICD - hs troponin 13, delta pending - EKG with anterior Q waves, old - chest pain sounds MSK / ICD pocket related, not ACS - in addition, heart cath last year without evidence of CAD   Permanent Atrial fibrillation - planning for AV node ablation 09/2022 - continue BB - 75 mg toprol BID   Chronic anticoagulation Continue xarelto - did have interruption for ICD implantation (48 hrs)   PVCs, hx of NSVT - planning for PET study   Left lung opacity - seen on cMRI - CT chest was recommended, pt has repeatedly refused   Risk Assessment/Risk Scores:   New York Heart Association (NYHA) Functional Class NYHA Class III  CHA2DS2-VASc Score = 3   This indicates a 3.2% annual risk of stroke. The patient's score is based upon: CHF History: 1 HTN History: 1 Diabetes History: 1 Stroke History: 0 Vascular Disease History: 0 Age Score: 0 Gender Score: 0       For questions or updates, please contact [facility] Please consult [email or web address] for contact info under    Signed, Marcelino Duster, PA  08/15/2022 9:15 AM

## 2022-08-15 NOTE — Progress Notes (Signed)
Pharmacy: Re- Lovenox --> xarelto  Patient's a 57 y.o s/p recent pacemaker placement and hx afib on xarelto PTA who presented to the ED on 08/15/22 with CP and SOB. He was started on lovenox on admission for r/o DVT. LE doppler came back neg for DVT. Pharmacy has been consulted to resume xarelto back for pt.  - lovenox 135 mg SQ x1 given on 08/15/22 at 9:30a  Plan: - d/c lovenox - start xarelto 20 mg daily at 9pm tonight - Pharmacy will sign off for xarelto consult, but will follow patient peripherally along with you.  Reconsult [place] if need further assistance.  Dorna Leitz, PharmD, BCPS 08/15/2022 12:47 PM

## 2022-08-15 NOTE — ED Notes (Signed)
Save blue tube in main lab °

## 2022-08-15 NOTE — Discharge Summary (Signed)
Physician Discharge Summary   Patient: William Fischer MRN: [id number] DOB: October 15, 1965  Admit date:     08/15/2022  Discharge date: 08/15/22  Discharge Physician: Bobette Mo   PCP: Pcp, No   Recommendations at discharge:   Follow-up cardiology instructions. follow-up with cardiology as scheduled.  Discharge Diagnoses: Assessment and Plan: Principal Problem:   Acute on chronic combined systolic  (congestive) and diastolic (congestive) heart failure (HCC) Positive dyspnea, but does not seem in very volume overloaded. Observation/telemetry. Supplemental oxygen as needed. Sodium and fluid restriction. Continue Farxiga 10 mg p.o. daily. Continue furosemide IVP PER cardiology. Continue Entresto 24/26 mg p.o. twice daily. Continue metoprolol succinate 75 mg p.o. twice daily. Continue spironolactone 25 mg p.o. daily. Resume torsemide 10 mg p.o. daily tomorrow. Monitor daily weights, intake and output. Monitor renal function electrolytes. Check echocardiogram. Cardiology consult appreciated. Will follow their recommendations. They believe that the patient can go home now.   Active Problems:   Chest pain Secondary to:   ICD (implantable cardioverter-defibrillator) in place Seems to be from his ICD site. Troponin level was normal. Analgesics as needed.     Prediabetes Last HbA1c 10.9% on 02/22/2022. Carbohydrate modified diet. On Farxiga as above. CBG monitoring AC/HS. Beginning RI SS as needed. Check hemoglobin A1c.     Chronic atrial fibrillation CHA2DS2-VASc Score of 4. Off anticoagulation. Continue metoprolol for rate control.     Essential hypertension Continue metoprolol, Entresto and diuretics as above.     Hyperlipidemia Continue rosuvastatin 20 mg p.o. daily.    Hospital Course: Chest Pain   HPI: [name] is a 57 y.o. male with medical history significant of IVP dye allergy, varicella-zoster, depression, anxiety, mesenteric embolus,  diverticulosis, perforated diverticulitis, history of C. difficile colitis, intra-abdominal abscess, class III obesity, hypertension, migraine headaches, pleural effusion, possible sleep apnea, abdominal abscess, history of UTI, history of AKI, chronic atrial fibrillation,Dilated cardiomyopathy, chronic combined systolic and diastolic heart failure, status post ICD placement 2 weeks ago who came into the emergency department dilated cardiomyopathy, chronic combined systolic and diastolic heart failure, status post ICD placement 2 weeks ago who came into the emergency department for left upper chest area chest pain and dyspnea for the past 2 days.  Chest pain radiates to his left shoulder and arm.  He also complains of right thigh pain and numbness. He has not been taking his Xarelto.  Denied fever, chills, rhinorrhea, sore throat, wheezing or hemoptysis. No abdominal pain, nausea, emesis, diarrhea, constipation, melena or hematochezia.  No flank pain, dysuria, frequency or hematuria.  No polyuria, polydipsia, polyphagia or blurred vision.    Lab work: CBC was normal, BMP with a glucose of 145 mg/dL, but otherwise unremarkable.  First troponin, magnesium and phosphorus were normal.  BNP 140.3 pg/mL.   Imaging: 2 view chest radiograph is showing cardiomegaly with mildly distended pulmonary vasculature.  Mild atelectasis, edema or infiltrate at the lung bases.   ED course: Initial vital signs were temperature 98 F, pulse 98, respiration 20, BP 165/93 mmHg O2 sat 90% on room air.  The patient received furosemide 20 mg IVP.  I added hydromorphone 1 mg p.o., oxycodone 5 mg p.o., prochlorperazine 10 mg IVP and KCl 40 mEq p.o.  Assessment and Plan: No notes have been filed under this hospital service. Service: Hospitalist   Consultants:  Procedures performed: None. Disposition: Home Diet recommendation:  Discharge Diet Orders (From admission, onward)     Start     Ordered   08/15/22 0000  Diet - low  sodium heart healthy        08/15/22 1454   08/15/22 0000  Diet Carb Modified        08/15/22 1454           Cardiac and Carb modified diet DISCHARGE MEDICATION: Allergies as of 08/15/2022       Reactions   Contrast Media [iodinated Contrast Media] Rash   a diffuse macular rash after CTA chest  Pt was premedicated with 125mg  IV Solumedrol, 50mg  IV Benadryl 1 hr prior to CTexam, and tolerated procedure without any difficulties. 11/28/11 Also same pre med protocol observed on 02/22/15 and pt tolerated the procedure well   Tramadol         Medication List     STOP taking these medications    diphenhydrAMINE 50 MG capsule Commonly known as: BENADRYL   doxycycline 100 MG capsule Commonly known as: VIBRAMYCIN   predniSONE 50 MG tablet Commonly known as: DELTASONE       TAKE these medications    acetaminophen 500 MG tablet Commonly known as: TYLENOL Take 1,000 mg by mouth every 6 (six) hours as needed (pain).   b complex vitamins capsule Take 1 capsule by mouth daily.   dapagliflozin propanediol 10 MG Tabs tablet Commonly known as: Farxiga Take 1 tablet (10 mg total) by mouth daily.   Entresto 24-26 MG Generic drug: sacubitril-valsartan Take 1 tablet by mouth 2 (two) times daily.   metoprolol succinate 25 MG 24 hr tablet Commonly known as: TOPROL-XL Take 3 tablets (75 mg total) by mouth 2 (two) times daily. Take with or immediately following a meal.   ONE-A-DAY 50 PLUS PO Take 1 capsule by mouth daily in the afternoon.   rosuvastatin 20 MG tablet Commonly known as: CRESTOR Take 1 tablet (20 mg total) by mouth daily.   spironolactone 25 MG tablet Commonly known as: ALDACTONE Take 1 tablet (25 mg total) by mouth daily.   torsemide 10 MG tablet Commonly known as: DEMADEX Take 1 tablet (10 mg total) by mouth daily.   Xarelto 20 MG Tabs tablet Generic drug: rivaroxaban TAKE 1 TABLET BY MOUTH DAILY WITH SUPPER.        Discharge  Exam:  Vitals: 08/15/22 0309 BP: (!) 165/93 (!) 127/90 Pulse: 98 84 Resp: 20 16  08/15/22 0819 Temp: 98 F (36.7 C) 98.1 F (36.7 C) TempSrc: Oral Oral SpO2: 90% 95%   Physical Exam Vitals and nursing note reviewed.  Constitutional:      General: He is awake. He is not in acute distress.    Appearance: He is well-developed. He is obese.  HENT:     Head: Normocephalic.     Nose: No rhinorrhea.     Mouth/Throat:     Mouth: Mucous membranes are moist.  Eyes:     General: No scleral icterus.    Pupils: Pupils are equal, round, and reactive to light.  Neck:     Vascular: No JVD.  Cardiovascular:     Rate and Rhythm: Normal rate and regular rhythm.     Heart sounds: S1 normal and S2 normal.  Pulmonary:     Effort: Pulmonary effort is normal. No tachypnea or accessory muscle usage.     Breath sounds: No wheezing, rhonchi or rales.  Abdominal:     General: Abdomen is protuberant. Bowel sounds are normal. There is no distension.     Palpations: Abdomen is soft.     Tenderness: There is no abdominal tenderness. There  is no right CVA tenderness or left CVA tenderness.  Musculoskeletal:     Cervical back: Neck supple.     Right lower leg: No edema.     Left lower leg: No edema.  Skin:    General: Skin is warm and dry.  Neurological:     General: No focal deficit present.     Mental Status: He is alert and oriented to person, place, and time.  Psychiatric:        Mood and Affect: Mood normal.        Behavior: Behavior normal. Behavior is cooperative.       Condition at discharge: good  The results of significant diagnostics from this hospitalization (including imaging, microbiology, ancillary and laboratory) are listed below for reference.   Imaging Studies: VAS [place] LOWER EXTREMITY VENOUS (DVT) (ONLY [facility] & [facility])  Result Date: 08/15/2022  Lower Venous DVT Study Patient Name:  ASHOK PLESHA  Date of Exam:   08/15/2022 Medical Rec #: [id number]         Accession  #:    [id number] Date of Birth: February 01, 1965          Patient Gender: M Patient Age:   30 years Exam Location:  [facility] Procedure:      VAS [place] LOWER EXTREMITY VENOUS (DVT) Referring Phys: Glynn Octave --------------------------------------------------------------------------------  Indications: "pins and needles" feeling in posterior thigh.  Comparison Study: No previous exams Performing Technologist: Jody Hill RVT, RDMS  Examination Guidelines: A complete evaluation includes B-mode imaging, spectral Doppler, color Doppler, and power Doppler as needed of all accessible portions of each vessel. Bilateral testing is considered an integral part of a complete examination. Limited examinations for reoccurring indications may be performed as noted. The reflux portion of the exam is performed with the patient in reverse Trendelenburg.  +---------+---------------+---------+-----------+----------+--------------+ RIGHT    CompressibilityPhasicitySpontaneityPropertiesThrombus Aging +---------+---------------+---------+-----------+----------+--------------+ CFV      Full           Yes      Yes                                 +---------+---------------+---------+-----------+----------+--------------+ SFJ      Full                                                        +---------+---------------+---------+-----------+----------+--------------+ FV Prox  Full           Yes      Yes                                 +---------+---------------+---------+-----------+----------+--------------+ FV Mid   Full           Yes      Yes                                 +---------+---------------+---------+-----------+----------+--------------+ FV DistalFull           Yes      Yes                                 +---------+---------------+---------+-----------+----------+--------------+  PFV      Full                                                         +---------+---------------+---------+-----------+----------+--------------+ POP      Full           Yes      Yes                                 +---------+---------------+---------+-----------+----------+--------------+ PTV      Full                                                        +---------+---------------+---------+-----------+----------+--------------+ PERO     Full                                                        +---------+---------------+---------+-----------+----------+--------------+   +----+---------------+---------+-----------+----------+--------------+ LEFTCompressibilityPhasicitySpontaneityPropertiesThrombus Aging +----+---------------+---------+-----------+----------+--------------+ CFV Full           Yes      Yes                                 +----+---------------+---------+-----------+----------+--------------+     Summary: RIGHT: - There is no evidence of deep vein thrombosis in the lower extremity.  - No cystic structure found in the popliteal fossa.  LEFT: - No evidence of common femoral vein obstruction.  *See table(s) above for measurements and observations.    Preliminary    DG Chest 2 View  Result Date: 08/15/2022 CLINICAL DATA:  Chest pain, shortness of breath. EXAM: CHEST - 2 VIEW COMPARISON:  08/02/2022. FINDINGS: Heart is enlarged and the mediastinal contour is within normal limits. There is atherosclerotic calcification of the aorta. The pulmonary vasculature is mildly distended. Mild airspace opacities are noted at the lung bases. No effusion or pneumothorax. The right costophrenic angle is excluded from the field of view. A multi lead pacemaker device is present over the left chest. No acute osseous abnormality. IMPRESSION: 1. Cardiomegaly with mildly distended pulmonary vasculature. 2. Mild atelectasis, edema, or infiltrate at the lung bases. Electronically Signed   By: Thornell Sartorius M.D.   On: 08/15/2022 03:45   DG Chest 2  View  Result Date: 08/02/2022 CLINICAL DATA:  Implantable cardioverter-defibrillator in place. EXAM: CHEST - 2 VIEW COMPARISON:  09/01/2021 FINDINGS: Two views of the chest demonstrate a left chest biventricular ICD device. Heart is enlarged. Negative for pneumothorax. Again noted are opacities in the midportion of both lungs. The left lung opacity has a relatively well-defined inferior border and may been present on the previous examination. Difficult to exclude a parenchymal or pleural lesion in this area. Again noted are prominent lung markings concerning for mild pulmonary edema. No large pleural effusions. IMPRESSION: 1. ICD placement.  No complicating features. 2. Persistent opacities in the midportion of both lungs. The left lung opacity has a  relatively well-defined inferior contour and may been present on the previous examination. Difficult to exclude parenchymal or pleural lesion in this area. Recommend further evaluation with a CT chest. 3. Evidence for mild pulmonary edema. Electronically Signed   By: Richarda Overlie M.D.   On: 08/02/2022 08:18   EP PPM/ICD IMPLANT  Result Date: 08/01/2022 CONCLUSIONS: 1. Chronic systolic heart failure and permanent atrial fibrillation 2. Successful implantation of a [employer] CRT-D for primary prevention 3. No early apparent complications. 4. Hold anticoagulation for 5 days (OK to restart 08/07/2022) 5. Continue with plan for AV junction ablation    Microbiology: Results for orders placed or performed during the hospital encounter of 09/01/21  SARS Coronavirus 2 by RT PCR (hospital order, performed in [facility] hospital lab) *cepheid single result test* Anterior Nasal Swab     Status: None   Collection Time: 09/01/21 12:49 PM   Specimen: Anterior Nasal Swab  Result Value Ref Range Status   SARS Coronavirus 2 by RT PCR NEGATIVE NEGATIVE Final    Comment: (NOTE) SARS-CoV-2 target nucleic acids are NOT DETECTED.  The SARS-CoV-2 RNA is generally detectable in  upper and lower respiratory specimens during the acute phase of infection. The lowest concentration of SARS-CoV-2 viral copies this assay can detect is 250 copies / mL. A negative result does not preclude SARS-CoV-2 infection and should not be used as the sole basis for treatment or other patient management decisions.  A negative result may occur with improper specimen collection / handling, submission of specimen other than nasopharyngeal swab, presence of viral mutation(s) within the areas targeted by this assay, and inadequate number of viral copies (<250 copies / mL). A negative result must be combined with clinical observations, patient history, and epidemiological information.  Fact Sheet for Patients:   RoadLapTop.co.za  Fact Sheet for Healthcare Providers: http://kim-miller.com/  This test is not yet approved or  cleared by the [place] FDA and has been authorized for detection and/or diagnosis of SARS-CoV-2 by FDA under an Emergency Use Authorization (EUA).  This EUA will remain in effect (meaning this test can be used) for the duration of the COVID-19 declaration under Section 564(b)(1) of the Act, 21 U.S.C. section 360bbb-3(b)(1), unless the authorization is terminated or revoked sooner.  Performed at [facility], [address] [place], [place] [address]     Labs: CBC: Recent Labs  Lab 08/15/22 0336  WBC 9.4  HGB 15.5  HCT 47.1  MCV 94.2  PLT 242   Basic Metabolic Panel: Recent Labs  Lab 08/15/22 0336  NA 135  K 3.6  CL 101  CO2 24  GLUCOSE 145*  BUN 13  CREATININE 0.95  CALCIUM 9.2  MG 2.1  PHOS 4.6   Liver Function Tests: No results for input(s): "AST", "ALT", "ALKPHOS", "BILITOT", "PROT", "ALBUMIN" in the last 168 hours. CBG: Recent Labs  Lab 08/15/22 0822 08/15/22 1220  GLUCAP 161* 189*    Discharge time spent: less than 30 minutes.  Signed: Bobette Mo, MD [facility] 08/15/2022  This document was prepared using Dragon voice recognition software and may contain some unintended transcription errors.

## 2022-08-15 NOTE — Progress Notes (Signed)
RLE venous duplex has been completed.   Results can be found under chart review under CV PROC. 08/15/2022 11:20 AM Lariah Fleer RVT, RDMS

## 2022-08-15 NOTE — ED Notes (Signed)
Walk test performed per cardiology request. Room air O2 remained 93 and over.

## 2022-08-15 NOTE — ED Provider Notes (Signed)
[place] EMERGENCY DEPARTMENT AT [facility] Provider Note   CSN: [id number] Arrival date & time: 08/15/22  0257     History  Chief Complaint  Patient presents with   Chest Pain    William Fischer is a 57 y.o. male.  With a history of nonischemic cardiomyopathy, pacemaker placement 2 weeks ago, atrial fibrillation on Xarelto, hypertension presenting with right sided stabbing thigh pain for the past 2 days.  Denies any fall or trauma.  Feels like something is "stabbing me in my thigh".  Had been off of his blood thinner to have a pacemaker placement and recently restarted it.  Is also noticed chest pain for the past 2 days at the site of his pacemaker that is worse with palpation and movement.  Increasing shortness of breath for the same time.  As well dyspnea with exertion to the point where he cannot take more than a step or 2.  Denies any cough or fever.  Denies any leg pain or leg swelling.  Denies any abdominal pain, nausea or vomiting.  All of his chest pain is at the site of his pacemaker that is worse with palpation and movement of the arm.  Complains of stabbing pain and numbness to the right lateral thigh.  No weakness in the leg.  No back pain.  The history is provided by the patient.  Chest Pain Associated symptoms: shortness of breath   Associated symptoms: no abdominal pain, no dizziness, no fever, no headache, no nausea, no vomiting and no weakness        Home Medications Prior to Admission medications   Medication Sig Start Date End Date Taking? Authorizing Provider  acetaminophen (TYLENOL) 500 MG tablet Take 1,000 mg by mouth every 6 (six) hours as needed (pain).   Yes [provider]  b complex vitamins capsule Take 1 capsule by mouth daily.   Yes [provider]  dapagliflozin propanediol (FARXIGA) 10 MG TABS tablet Take 1 tablet (10 mg total) by mouth daily. 11/22/21  Yes Little Ishikawa, MD  metoprolol succinate (TOPROL-XL)  25 MG 24 hr tablet Take 3 tablets (75 mg total) by mouth 2 (two) times daily. Take with or immediately following a meal. 08/02/22  Yes Sheilah Pigeon, PA-C  Multiple Vitamins-Minerals (ONE-A-DAY 50 PLUS PO) Take 1 capsule by mouth daily in the afternoon.   Yes [provider]  rivaroxaban (XARELTO) 20 MG TABS tablet TAKE 1 TABLET BY MOUTH DAILY WITH SUPPER. 01/31/22  Yes Little Ishikawa, MD  rosuvastatin (CRESTOR) 20 MG tablet Take 1 tablet (20 mg total) by mouth daily. 11/23/21 11/18/22 Yes Little Ishikawa, MD  sacubitril-valsartan (ENTRESTO) 24-26 MG Take 1 tablet by mouth 2 (two) times daily. 02/08/22  Yes Little Ishikawa, MD  spironolactone (ALDACTONE) 25 MG tablet Take 1 tablet (25 mg total) by mouth daily. 09/22/21  Yes Little Ishikawa, MD  torsemide (DEMADEX) 10 MG tablet Take 1 tablet (10 mg total) by mouth daily. 09/22/21  Yes Little Ishikawa, MD  diphenhydrAMINE (BENADRYL) 50 MG capsule Take 1 tablet (50 mg) prior to going to the hospital for your procedure Patient not taking: Reported on 08/15/2022 06/29/22   Lanier Prude, MD  doxycycline (VIBRAMYCIN) 100 MG capsule Take 1 capsule (100 mg total) by mouth 2 (two) times daily. Patient not taking: Reported on 08/15/2022 07/26/22   Geoffery Lyons, MD  predniSONE (DELTASONE) 50 MG tablet Take 1 tablet 13 hours prior to procedure, 1 tablet  7 hours prior to procedure, and 1 tablet (with Benedryl 50 mg) prior to going to the hospital for procedure Patient not taking: Reported on 08/15/2022 06/29/22   Lanier Prude, MD      Allergies    Contrast media [iodinated contrast media] and Tramadol    Review of Systems   Review of Systems  Constitutional:  Negative for activity change, appetite change and fever.  HENT:  Negative for congestion and rhinorrhea.   Respiratory:  Positive for chest tightness and shortness of breath.   Cardiovascular:  Positive for chest pain.  Gastrointestinal:  Negative for  abdominal pain, nausea and vomiting.  Genitourinary:  Negative for dysuria and hematuria.  Musculoskeletal:  Positive for arthralgias and myalgias.  Skin:  Negative for rash.  Neurological:  Negative for dizziness, weakness and headaches.   all other systems are negative except as noted in the HPI and PMH.    Physical Exam Updated Vital Signs BP (!) 165/93 (BP Location: Right Arm)   Pulse 98   Temp 98 F (36.7 C) (Oral)   Resp 20   SpO2 90%  Physical Exam Vitals and nursing note reviewed.  Constitutional:      General: He is in acute distress.     Appearance: He is well-developed.     Comments: Dyspneic with conversation  HENT:     Head: Normocephalic and atraumatic.     Mouth/Throat:     Pharynx: No oropharyngeal exudate.  Eyes:     Conjunctiva/sclera: Conjunctivae normal.     Pupils: Pupils are equal, round, and reactive to light.  Neck:     Comments: No meningismus. Cardiovascular:     Rate and Rhythm: Tachycardia present. Rhythm irregular.     Heart sounds: Normal heart sounds. No murmur heard. Pulmonary:     Effort: Pulmonary effort is normal. No respiratory distress.     Breath sounds: Rales present.  Abdominal:     Palpations: Abdomen is soft.     Tenderness: There is no abdominal tenderness. There is no guarding or rebound.  Musculoskeletal:        General: Tenderness present. Normal range of motion.     Cervical back: Normal range of motion and neck supple.     Comments: Right anterior lateral thigh tenderness, no guarding or rebound.  Compartments are soft.  Intact DP and PT pulses.  Nodular scarring to right gluteal crease without fluctuance  Skin:    General: Skin is warm.  Neurological:     Mental Status: He is alert and oriented to person, place, and time.     Cranial Nerves: No cranial nerve deficit.     Motor: No abnormal muscle tone.     Coordination: Coordination normal.     Comments:  5/5 strength throughout. CN 2-12 intact.Equal grip strength.    Psychiatric:        Behavior: Behavior normal.     ED Results / Procedures / Treatments   Labs (all labs ordered are listed, but only abnormal results are displayed) Labs Reviewed  BASIC METABOLIC PANEL - Abnormal; Notable for the following components:      Result Value   Glucose, Bld 145 (*)    All other components within normal limits  CBC  BRAIN NATRIURETIC PEPTIDE  TROPONIN I (HIGH SENSITIVITY)  TROPONIN I (HIGH SENSITIVITY)    EKG EKG Interpretation Date/Time:  Monday August 15 2022 03:12:54 EDT Ventricular Rate:  93 PR Interval:    QRS Duration:  108 QT Interval:  379 QTC Calculation: 477 R Axis:   107  Text Interpretation: Atrial fibrillation Anterior infarct, old No significant change was found Confirmed by Glynn Octave [phone]) on 08/15/2022 4:11:35 AM  Radiology DG Chest 2 View  Result Date: 08/15/2022 CLINICAL DATA:  Chest pain, shortness of breath. EXAM: CHEST - 2 VIEW COMPARISON:  08/02/2022. FINDINGS: Heart is enlarged and the mediastinal contour is within normal limits. There is atherosclerotic calcification of the aorta. The pulmonary vasculature is mildly distended. Mild airspace opacities are noted at the lung bases. No effusion or pneumothorax. The right costophrenic angle is excluded from the field of view. A multi lead pacemaker device is present over the left chest. No acute osseous abnormality. IMPRESSION: 1. Cardiomegaly with mildly distended pulmonary vasculature. 2. Mild atelectasis, edema, or infiltrate at the lung bases. Electronically Signed   By: Thornell Sartorius M.D.   On: 08/15/2022 03:45    Procedures Procedures    Medications Ordered in ED Medications  furosemide (LASIX) injection 20 mg (has no administration in time range)    ED Course/ Medical Decision Making/ A&P                             Medical Decision Making Amount and/or Complexity of Data Reviewed Labs: ordered. Decision-making details documented in ED Course. Radiology:  ordered and independent interpretation performed. Decision-making details documented in ED Course. ECG/medicine tests: ordered and independent interpretation performed. Decision-making details documented in ED Course.  Risk Prescription drug management. Decision regarding hospitalization.   Right lateral thigh pain for the past 2 days associated with chest pain and shortness of breath.  Recent pacemaker placement.  Does appear dyspneic with exertion and dyspneic at rest.  Crackles at the bases.  EKG is atrial fibrillation without acute ST changes.  X-ray concerning for pulmonary edema.  Will give IV Lasix  Labs reassuring.  Troponin normal.  Creatinine normal.  Chest x-ray as above with likely interstitial and pulmonary edema.   Patient able to ambulate without desaturation but significantly dyspneic. Will screen with serial troponins.  Would benefit from IV diuresis. Will obtain Doppler study to rule out DVT in right leg.  His right thigh pain appears to be musculoskeletal but he has been off of his anticoagulation for several days prior to his pacemaker placement.  Low suspicion for ACS as pain is reproducible at site of his pacemaker.  Plan admission for IV diuresis, chest pain rule out and DVT rule out.  Discussed with Dr. Margo Aye.        Final Clinical Impression(s) / ED Diagnoses Final diagnoses:  None    Rx / DC Orders ED Discharge Orders     None         Jamey Demchak, Jeannett Senior, MD 08/15/22 [phone]

## 2022-08-15 NOTE — Progress Notes (Signed)
ANTICOAGULATION CONSULT NOTE - Initial Consult  Pharmacy Consult for enoxaparin Indication: possible DVT  Allergies  Allergen Reactions   Contrast Media [Iodinated Contrast Media] Rash    a diffuse macular rash after CTA chest  Pt was premedicated with 125mg  IV Solumedrol, 50mg  IV Benadryl 1 hr prior to CTexam, and tolerated procedure without any difficulties. 11/28/11 Also same pre med protocol observed on 02/22/15 and pt tolerated the procedure well   Tramadol     Vital Signs: Temp: 98.1 F (36.7 C) (07/29 0819) Temp Source: Oral (07/29 0819) BP: 127/90 (07/29 0819) Pulse Rate: 84 (07/29 0819)  Labs: Recent Labs    08/15/22 0336  HGB 15.5  HCT 47.1  PLT 242  CREATININE 0.95  TROPONINIHS 13    Estimated Creatinine Clearance: 125 mL/min (by C-G formula based on SCr of 0.95 mg/dL).   Medical History: Past Medical History:  Diagnosis Date   Allergy to IVP dye    Atrial fibrillation (HCC)    admx 11/13 with acute sCHF in setting of RVR  => a. failed DCCV x 2; b. Pradaxa started;  c. failed sotalol   Chicken pox as a child   Chronic anticoagulation    Pradaxa   Chronic systolic heart failure (HCC)    a. echo 11/13: Ef 40-45%, diff HK, mod MR, mod LAE, mild RVE, mod RAE, small effusion;   b. 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 He presented with pain in thigh as well as chest pain at site of pacemaker. He recently had pacemaker placed 7/15 for which he held Xarelto 48 hrs prior to procedure and then resumed on 7/21 per Cardiology notes. Rule out DVT given reported pain in thigh. Pharmacy consulted for enoxaparin in the  interim.  Plan:  -Enoxaparin 135 mg (1 mg/kg) q12h -Follow up US lower extremities and plan for anticoagulation once results  Pricilla Riffle, PharmD, BCPS Clinical Pharmacist 08/15/2022 8:51 AM

## 2022-08-17 ENCOUNTER — Ambulatory Visit: Payer: Medicaid Other | Attending: Internal Medicine

## 2022-08-17 DIAGNOSIS — I5022 Chronic systolic (congestive) heart failure: Secondary | ICD-10-CM

## 2022-08-17 NOTE — Patient Instructions (Signed)
   After Your ICD (Implantable Cardiac Defibrillator)    Monitor your defibrillator site for redness, swelling, and drainage. Call the device clinic at [phone] if you experience these symptoms or fever/chills.  Your incision was closed with Steri-strips or staples:  You may shower 7 days after your procedure and wash your incision with soap and water. Avoid lotions, ointments, or perfumes over your incision until it is well-healed.    You may use a hot tub or a pool after your wound check appointment if the incision is completely closed.  Do not lift, push or pull greater than 10 pounds with the affected arm until 6 weeks after your procedure. There are no other restrictions in arm movement after your wound check appointment.  Your ICD is MRI compatible.  Your ICD is designed to protect you from life threatening heart rhythms. Because of this, you may receive a shock.   1 shock with no symptoms:  Call the office during business hours. 1 shock with symptoms (chest pain, chest pressure, dizziness, lightheadedness, shortness of breath, overall feeling unwell):  Call 911. If you experience 2 or more shocks in 24 hours:  Call 911. If you receive a shock, you should not drive.  [place] DMV - no driving for 6 months if you receive appropriate therapy from your ICD.   ICD Alerts:  Some alerts are vibratory and others beep. These are NOT emergencies. Please call our office to let us know. If this occurs at night or on weekends, it can wait until the next business day. Send a remote transmission.  If your device is capable of reading fluid status (for heart failure), you will be offered monthly monitoring to review this with you.   Remote monitoring is used to monitor your ICD from home. This monitoring is scheduled every 91 days by our office. It allows us to keep an eye on the functioning of your device to ensure it is working properly. You will routinely see your Electrophysiologist annually  (more often if necessary).   

## 2022-08-17 NOTE — Progress Notes (Signed)
Wound check appointment. Steri-strips removed. Wound without redness or edema. Incision edges approximated, wound well healed. Normal device function. Thresholds, sensing, and impedances consistent with implant measurements. Device programmed at 3.5V for extra safety margin until 3 month visit. Histogram distribution appropriate for patient and level of activity. 1,430 VT-NS events noted, EGM's available appear irregular in v-v intervals. 75 SVT events noted. AV node ablation scheduled 09/20/2022. Patient was seen in ED yesterday 08/16/22 for cp, fluid retention.  Patient educated about wound care, arm mobility, lifting restrictions, shock plan. ROV in 3 months with implanting physician.  Per OV note 06/27/2022 with Dr. Lalla Brothers advised the following below.    "  I do not see meds listed on AVS at discharge. Routing to Dr. Lalla Brothers to advise considering elevated rates noted on report attached.

## 2022-08-29 MED ORDER — MAGNESIUM OXIDE 400 MG PO CAPS: 400.0000 mg | ORAL_CAPSULE | Freq: Every day | ORAL | 3 refills | Status: AC

## 2022-08-29 MED ORDER — RANOLAZINE ER 500 MG PO TB12: 500.0000 mg | ORAL_TABLET | Freq: Two times a day (BID) | ORAL | 3 refills | Status: DC

## 2022-08-29 NOTE — Addendum Note (Signed)
Addended by: Frutoso Schatz on: 08/29/2022 07:53 AM   Modules accepted: Orders

## 2022-09-13 ENCOUNTER — Ambulatory Visit: Payer: Medicaid Other | Attending: Physician Assistant

## 2022-09-13 DIAGNOSIS — I4821 Permanent atrial fibrillation: Secondary | ICD-10-CM | POA: Diagnosis not present

## 2022-09-13 DIAGNOSIS — I5042 Chronic combined systolic (congestive) and diastolic (congestive) heart failure: Secondary | ICD-10-CM

## 2022-09-14 LAB — CBC WITH DIFFERENTIAL/PLATELET
Basophils Absolute: 0.1 10*3/uL (ref 0.0–0.2)
Basos: 1 %
EOS (ABSOLUTE): 0.2 10*3/uL (ref 0.0–0.4)
Eos: 2 %
Hematocrit: 51.2 % — ABNORMAL HIGH (ref 37.5–51.0)
Hemoglobin: 16.6 g/dL (ref 13.0–17.7)
Immature Grans (Abs): 0 10*3/uL (ref 0.0–0.1)
Immature Granulocytes: 0 %
Lymphocytes Absolute: 1.8 10*3/uL (ref 0.7–3.1)
Lymphs: 24 %
MCH: 30 pg (ref 26.6–33.0)
MCHC: 32.4 g/dL (ref 31.5–35.7)
MCV: 93 fL (ref 79–97)
Monocytes Absolute: 0.7 10*3/uL (ref 0.1–0.9)
Monocytes: 9 %
Neutrophils Absolute: 4.9 10*3/uL (ref 1.4–7.0)
Neutrophils: 64 %
Platelets: 234 10*3/uL (ref 150–450)
RBC: 5.53 x10E6/uL (ref 4.14–5.80)
RDW: 13.4 % (ref 11.6–15.4)
WBC: 7.7 10*3/uL (ref 3.4–10.8)

## 2022-09-14 LAB — BASIC METABOLIC PANEL
BUN/Creatinine Ratio: 10 (ref 9–20)
BUN: 10 mg/dL (ref 6–24)
CO2: 24 mmol/L (ref 20–29)
Calcium: 9.8 mg/dL (ref 8.7–10.2)
Chloride: 101 mmol/L (ref 96–106)
Creatinine, Ser: 0.98 mg/dL (ref 0.76–1.27)
Glucose: 139 mg/dL — ABNORMAL HIGH (ref 70–99)
Potassium: 4.4 mmol/L (ref 3.5–5.2)
Sodium: 140 mmol/L (ref 134–144)
eGFR: 91 mL/min/{1.73_m2} (ref >59)

## 2022-09-16 NOTE — Pre-Procedure Instructions (Signed)
Attempted to call patient regarding procedure instructions.  Left voicemail on the following items: Arrival time 1100 Nothing to eat or drink after midnight No meds AM of procedure Responsible person to drive you home and stay with you for 24 hrs

## 2022-09-20 ENCOUNTER — Encounter (HOSPITAL_COMMUNITY)
Admission: RE | Disposition: A | Discharge status: Home / Self Care | Payer: Self-pay | Source: Ambulatory Visit | Attending: Cardiology

## 2022-09-20 ENCOUNTER — Ambulatory Visit (HOSPITAL_COMMUNITY)
Admission: RE | Admit: 2022-09-20 | Discharge: 2022-09-20 | Disposition: A | Discharge status: Home / Self Care | Payer: 59 | Source: Ambulatory Visit | Attending: Cardiology | Admitting: Cardiology

## 2022-09-20 DIAGNOSIS — Z539 Procedure and treatment not carried out, unspecified reason: Secondary | ICD-10-CM | POA: Insufficient documentation

## 2022-09-20 SURGERY — AV NODE ABLATION

## 2022-09-20 MED ORDER — SODIUM CHLORIDE 0.9 % IV SOLN: INTRAVENOUS | Status: DC

## 2022-09-24 DIAGNOSIS — Z743 Need for continuous supervision: Secondary | ICD-10-CM | POA: Diagnosis not present

## 2022-09-24 DIAGNOSIS — R0600 Dyspnea, unspecified: Secondary | ICD-10-CM | POA: Diagnosis not present

## 2022-09-24 DIAGNOSIS — I499 Cardiac arrhythmia, unspecified: Secondary | ICD-10-CM | POA: Diagnosis not present

## 2022-09-24 DIAGNOSIS — I4891 Unspecified atrial fibrillation: Secondary | ICD-10-CM | POA: Diagnosis not present

## 2022-09-24 DIAGNOSIS — T1490XA Injury, unspecified, initial encounter: Secondary | ICD-10-CM | POA: Diagnosis not present

## 2022-09-25 ENCOUNTER — Observation Stay (HOSPITAL_COMMUNITY)
Admission: EM | Admit: 2022-09-25 | Discharge: 2022-09-25 | Disposition: A | Discharge status: Home / Self Care | Payer: Medicaid Other | Attending: Cardiology | Admitting: Cardiology

## 2022-09-25 ENCOUNTER — Encounter (HOSPITAL_COMMUNITY): Payer: Self-pay | Admitting: Cardiology

## 2022-09-25 ENCOUNTER — Emergency Department (HOSPITAL_COMMUNITY): Payer: 59

## 2022-09-25 ENCOUNTER — Observation Stay (HOSPITAL_BASED_OUTPATIENT_CLINIC_OR_DEPARTMENT_OTHER): Payer: 59

## 2022-09-25 ENCOUNTER — Other Ambulatory Visit: Payer: Self-pay

## 2022-09-25 DIAGNOSIS — I4901 Ventricular fibrillation: Principal | ICD-10-CM

## 2022-09-25 DIAGNOSIS — Z79899 Other long term (current) drug therapy: Secondary | ICD-10-CM | POA: Insufficient documentation

## 2022-09-25 DIAGNOSIS — Z87891 Personal history of nicotine dependence: Secondary | ICD-10-CM | POA: Diagnosis not present

## 2022-09-25 DIAGNOSIS — R55 Syncope and collapse: Secondary | ICD-10-CM | POA: Diagnosis present

## 2022-09-25 DIAGNOSIS — Y828 Other medical devices associated with adverse incidents: Secondary | ICD-10-CM | POA: Diagnosis not present

## 2022-09-25 DIAGNOSIS — I5023 Acute on chronic systolic (congestive) heart failure: Secondary | ICD-10-CM | POA: Diagnosis not present

## 2022-09-25 DIAGNOSIS — G9389 Other specified disorders of brain: Secondary | ICD-10-CM | POA: Diagnosis not present

## 2022-09-25 DIAGNOSIS — I428 Other cardiomyopathies: Secondary | ICD-10-CM

## 2022-09-25 DIAGNOSIS — T82897A Other specified complication of cardiac prosthetic devices, implants and grafts, initial encounter: Secondary | ICD-10-CM | POA: Insufficient documentation

## 2022-09-25 DIAGNOSIS — Z4502 Encounter for adjustment and management of automatic implantable cardiac defibrillator: Principal | ICD-10-CM

## 2022-09-25 DIAGNOSIS — I482 Chronic atrial fibrillation, unspecified: Secondary | ICD-10-CM | POA: Diagnosis present

## 2022-09-25 DIAGNOSIS — R0989 Other specified symptoms and signs involving the circulatory and respiratory systems: Secondary | ICD-10-CM | POA: Diagnosis not present

## 2022-09-25 DIAGNOSIS — E785 Hyperlipidemia, unspecified: Secondary | ICD-10-CM | POA: Diagnosis present

## 2022-09-25 DIAGNOSIS — I110 Hypertensive heart disease with heart failure: Secondary | ICD-10-CM | POA: Diagnosis not present

## 2022-09-25 DIAGNOSIS — I5042 Chronic combined systolic (congestive) and diastolic (congestive) heart failure: Secondary | ICD-10-CM | POA: Insufficient documentation

## 2022-09-25 DIAGNOSIS — I5022 Chronic systolic (congestive) heart failure: Secondary | ICD-10-CM | POA: Diagnosis present

## 2022-09-25 DIAGNOSIS — Z7901 Long term (current) use of anticoagulants: Secondary | ICD-10-CM | POA: Diagnosis not present

## 2022-09-25 DIAGNOSIS — I340 Nonrheumatic mitral (valve) insufficiency: Secondary | ICD-10-CM | POA: Insufficient documentation

## 2022-09-25 DIAGNOSIS — I10 Essential (primary) hypertension: Secondary | ICD-10-CM | POA: Diagnosis present

## 2022-09-25 DIAGNOSIS — J9811 Atelectasis: Secondary | ICD-10-CM | POA: Diagnosis not present

## 2022-09-25 DIAGNOSIS — R918 Other nonspecific abnormal finding of lung field: Secondary | ICD-10-CM | POA: Diagnosis not present

## 2022-09-25 LAB — COMPREHENSIVE METABOLIC PANEL
ALT: 21 U/L (ref 0–44)
AST: 20 U/L (ref 15–41)
Albumin: 3.9 g/dL (ref 3.5–5.0)
Alkaline Phosphatase: 65 U/L (ref 38–126)
Anion gap: 12 (ref 5–15)
BUN: 10 mg/dL (ref 6–20)
CO2: 25 mmol/L (ref 22–32)
Calcium: 9.4 mg/dL (ref 8.9–10.3)
Chloride: 102 mmol/L (ref 98–111)
Creatinine, Ser: 0.94 mg/dL (ref 0.61–1.24)
GFR, Estimated: >60 mL/min (ref >60)
Glucose, Bld: 149 mg/dL — ABNORMAL HIGH (ref 70–99)
Potassium: 4.1 mmol/L (ref 3.5–5.1)
Sodium: 139 mmol/L (ref 135–145)
Total Bilirubin: 0.7 mg/dL (ref 0.3–1.2)
Total Protein: 7.8 g/dL (ref 6.5–8.1)

## 2022-09-25 LAB — ECHOCARDIOGRAM COMPLETE
MV M vel: 4.89 m/s
MV Peak grad: 95.6 mmHg
S' Lateral: 5.3 cm

## 2022-09-25 LAB — BRAIN NATRIURETIC PEPTIDE: B Natriuretic Peptide: 87.1 pg/mL (ref 0.0–100.0)

## 2022-09-25 LAB — CBC WITH DIFFERENTIAL/PLATELET
Abs Immature Granulocytes: 0.02 10*3/uL (ref 0.00–0.07)
Basophils Absolute: 0 10*3/uL (ref 0.0–0.1)
Basophils Relative: 1 %
Eosinophils Absolute: 0.2 10*3/uL (ref 0.0–0.5)
Eosinophils Relative: 2 %
HCT: 51.6 % (ref 39.0–52.0)
Hemoglobin: 17 g/dL (ref 13.0–17.0)
Immature Granulocytes: 0 %
Lymphocytes Relative: 24 %
Lymphs Abs: 1.9 10*3/uL (ref 0.7–4.0)
MCH: 30.2 pg (ref 26.0–34.0)
MCHC: 32.9 g/dL (ref 30.0–36.0)
MCV: 91.8 fL (ref 80.0–100.0)
Monocytes Absolute: 0.9 10*3/uL (ref 0.1–1.0)
Monocytes Relative: 10 %
Neutro Abs: 5.2 10*3/uL (ref 1.7–7.7)
Neutrophils Relative %: 63 %
Platelets: 229 10*3/uL (ref 150–400)
RBC: 5.62 MIL/uL (ref 4.22–5.81)
RDW: 14.3 % (ref 11.5–15.5)
WBC: 8.2 10*3/uL (ref 4.0–10.5)
nRBC: 0 % (ref 0.0–0.2)

## 2022-09-25 LAB — TROPONIN I (HIGH SENSITIVITY)
Troponin I (High Sensitivity): 10 ng/L (ref <18)
Troponin I (High Sensitivity): 8 ng/L (ref <18)

## 2022-09-25 LAB — HIV ANTIBODY (ROUTINE TESTING W REFLEX): HIV Screen 4th Generation wRfx: NONREACTIVE

## 2022-09-25 MED ORDER — DAPAGLIFLOZIN PROPANEDIOL 10 MG PO TABS
10.0000 mg | ORAL_TABLET | Freq: Every day | ORAL | Status: DC
  Administered 2022-09-25: 10 mg via ORAL
  Filled 2022-09-25: qty 1

## 2022-09-25 MED ORDER — TORSEMIDE 20 MG PO TABS
10.0000 mg | ORAL_TABLET | Freq: Every day | ORAL | Status: DC
  Administered 2022-09-25: 10 mg via ORAL
  Filled 2022-09-25: qty 1

## 2022-09-25 MED ORDER — RIVAROXABAN 10 MG PO TABS: 20.0000 mg | ORAL_TABLET | Freq: Every day | ORAL | Status: DC

## 2022-09-25 MED ORDER — MAGNESIUM OXIDE -MG SUPPLEMENT 400 (240 MG) MG PO TABS
400.0000 mg | ORAL_TABLET | Freq: Every day | ORAL | Status: DC
  Administered 2022-09-25: 400 mg via ORAL
  Filled 2022-09-25: qty 1

## 2022-09-25 MED ORDER — SPIRONOLACTONE 12.5 MG HALF TABLET
25.0000 mg | ORAL_TABLET | Freq: Every day | ORAL | Status: DC
  Administered 2022-09-25: 25 mg via ORAL
  Filled 2022-09-25: qty 2

## 2022-09-25 MED ORDER — B COMPLEX-C PO TABS
1.0000 | ORAL_TABLET | Freq: Every day | ORAL | Status: DC
  Administered 2022-09-25: 1 via ORAL
  Filled 2022-09-25: qty 1

## 2022-09-25 MED ORDER — METOPROLOL SUCCINATE ER 25 MG PO TB24
75.0000 mg | ORAL_TABLET | Freq: Two times a day (BID) | ORAL | Status: DC
  Administered 2022-09-25: 75 mg via ORAL
  Filled 2022-09-25: qty 3

## 2022-09-25 MED ORDER — ACETAMINOPHEN 500 MG PO TABS: 1000.0000 mg | ORAL_TABLET | Freq: Four times a day (QID) | ORAL | Status: DC | PRN

## 2022-09-25 MED ORDER — ROSUVASTATIN CALCIUM 20 MG PO TABS
20.0000 mg | ORAL_TABLET | Freq: Every day | ORAL | Status: DC
  Administered 2022-09-25: 20 mg via ORAL
  Filled 2022-09-25: qty 1

## 2022-09-25 MED ORDER — AMIODARONE HCL 200 MG PO TABS: 200.0000 mg | ORAL_TABLET | Freq: Every day | ORAL | 2 refills | Status: DC

## 2022-09-25 MED ORDER — AMIODARONE HCL 200 MG PO TABS
200.0000 mg | ORAL_TABLET | Freq: Two times a day (BID) | ORAL | Status: DC
  Administered 2022-09-25: 200 mg via ORAL
  Filled 2022-09-25: qty 1

## 2022-09-25 MED ORDER — SACUBITRIL-VALSARTAN 24-26 MG PO TABS
1.0000 | ORAL_TABLET | Freq: Two times a day (BID) | ORAL | Status: DC
  Administered 2022-09-25: 1 via ORAL
  Filled 2022-09-25: qty 1

## 2022-09-25 MED ORDER — AMIODARONE HCL 200 MG PO TABS: 200.0000 mg | ORAL_TABLET | Freq: Two times a day (BID) | ORAL | 0 refills | Status: DC

## 2022-09-25 MED ORDER — ACETAMINOPHEN 500 MG PO TABS: 1000.0000 mg | ORAL_TABLET | Freq: Four times a day (QID) | ORAL | Status: AC | PRN

## 2022-09-25 MED ORDER — RANOLAZINE ER 500 MG PO TB12
500.0000 mg | ORAL_TABLET | Freq: Two times a day (BID) | ORAL | Status: DC
  Administered 2022-09-25: 500 mg via ORAL
  Filled 2022-09-25: qty 1
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
## 2022-09-25 NOTE — Progress Notes (Signed)
  Echocardiogram 2D Echocardiogram has been performed.  William Fischer 09/25/2022, 1:51 PM
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
## 2022-09-25 NOTE — ED Notes (Signed)
Transported to ECHO.
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
## 2022-09-25 NOTE — Discharge Instructions (Signed)
Medication Changes: - START Amiodarone 200mg  twice daily for 2 weeks and then decrease to 200mg  once daily. - Ranexa was discontinued because you stated you were no longer taking this.
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
## 2022-10-07 ENCOUNTER — Ambulatory Visit: Payer: 59 | Attending: Student | Admitting: Student

## 2022-10-07 ENCOUNTER — Encounter: Payer: Self-pay | Admitting: Cardiology

## 2022-10-07 ENCOUNTER — Encounter: Payer: Self-pay | Admitting: Student

## 2022-10-07 VITALS — BP 136/80 | HR 80 | Ht 71.0 in | Wt 291.4 lb

## 2022-10-07 DIAGNOSIS — I5022 Chronic systolic (congestive) heart failure: Secondary | ICD-10-CM

## 2022-10-07 DIAGNOSIS — I4729 Other ventricular tachycardia: Secondary | ICD-10-CM

## 2022-10-07 DIAGNOSIS — I4821 Permanent atrial fibrillation: Secondary | ICD-10-CM | POA: Diagnosis not present

## 2022-10-07 DIAGNOSIS — I493 Ventricular premature depolarization: Secondary | ICD-10-CM

## 2022-10-07 LAB — CUP PACEART INCLINIC DEVICE CHECK
Date Time Interrogation Session: 20240920125910
Implantable Lead Connection Status: 753985
Implantable Lead Connection Status: 753985
Implantable Lead Implant Date: 20240715
Implantable Lead Implant Date: 20240715
Implantable Lead Location: 753859
Implantable Lead Location: 753860
Implantable Lead Model: 4598
Implantable Pulse Generator Implant Date: 20240715

## 2022-10-07 NOTE — Patient Instructions (Addendum)
Medication Instructions:  Your physician recommends that you continue on your current medications as directed. Please refer to the Current Medication list given to you today.  *If you need a refill on your cardiac medications before your next appointment, please call your pharmacy*  Lab Work: CMET, TSH, FreeT4-TODAY If you have labs (blood work) drawn today and your tests are completely normal, you will receive your results only by: MyChart Message (if you have MyChart) OR A paper copy in the mail If you have any lab test that is abnormal or we need to change your treatment, we will call you to review the results.  Follow-Up: At [facility], you and your health needs are our priority.  As part of our continuing mission to provide you with exceptional heart care, we have created designated Provider Care Teams.  These Care Teams include your primary Cardiologist (physician) and Advanced Practice Providers (APPs -  Physician Assistants and Nurse Practitioners) who all work together to provide you with the care you need, when you need it.  Your next appointment:   12/07/22 at 4:00 PM  Provider:   Steffanie Dunn, MD

## 2022-10-07 NOTE — Progress Notes (Addendum)
Electrophysiology Office Note:   ID:  William Fischer, DOB 1965/05/12, MRN [id number]  Primary Cardiologist: Little Ishikawa, MD Electrophysiologist: Lanier Prude, MD      History of Present Illness:   William Fischer is a 57 y.o. male with h/o chronic combined CHF, NICM, HTN, persistent AF, OAC, h/o mesenteric embolus, colonic perforation due to diverticulitis, OSA, former tobacco abuse, and VF seen today for post hospital follow up.    Admitted 9/8 - 9/9 with syncope and ICD shock.  Started on amiodarone. Echo showed LVEF 35-40%, Normal RV, Mod LAE, Mod RAE, at least mild MR  Since discharge from hospital the patient reports doing well, no further syncope.   He has very specific thoughts about what has happened so far. Denies having been on ranolazine. He denies chest pain, palpitations, PND, or edema.   He refuses the PET scan and states   Review of systems complete and found to be negative unless listed in HPI.   EP Information / Studies Reviewed:    EKG is not ordered today. EKG from 09/25/2022 reviewed which showed rate controlled AF with intermittent V pacing       ICD Interrogation-  reviewed in detail today,  See PACEART report.  Device History: Medtronic BiV ICD implanted 07/2022 for chronic systolic CHF.  History of appropriate therapy: Yes History of AAD therapy: Yes; currently on amiodarone    cMRI 06/10/2022 1. Mild LV dilatation with moderate systolic dysfunction (EF 33%).  Global hypokinesis with focal area of thinning/akinesis in apical inferior wall 2.  Normal RV size and systolic function (EF 49%) 3. Basal septal midwall LGE, which is a scar pattern seen in nonischemic cardiomyopathies and associated with worse prognosis 4. RV insertion site LGE, which is a nonspecific scar pattern often seen in setting of elevated pulmonary pressures 5. Subendocardial LGE in apical inferior wall, suggesting small infarct. Given normal coronary arteries on recent  cath, could represent embolic event. Would also consider sarcoid on the differential, as can cause both midwall and subendocardial LGE. Recommend FDG-PET to evaluate for sarcoid.  6.  Left lung opacity, recommend CT chest for further evaluation  Physical Exam:   VS:  BP 136/80 (BP Location: Left Arm, Patient Position: Sitting, Cuff Size: Large)   Pulse 80   Ht 5\' 11"  (1.803 m)   Wt 291 lb 6.4 oz (132.2 kg)   SpO2 98%   BMI 40.64 kg/m    Wt Readings from Last 3 Encounters:  10/07/22 291 lb 6.4 oz (132.2 kg)  09/20/22 286 lb (129.7 kg)  08/01/22 298 lb 15.1 oz (135.6 kg)     GEN: Well nourished, well developed in no acute distress NECK: No JVD; No carotid bruits CARDIAC: Regular rate and rhythm, no murmurs, rubs, gallops RESPIRATORY:  Clear to auscultation without rales, wheezing or rhonchi  ABDOMEN: Soft, non-tender, non-distended EXTREMITIES:  No edema; No deformity   ASSESSMENT AND PLAN:    Chronic systolic dysfunction s/p Medtronic CRT-D  euvolemic today Stable on an appropriate medical regimen Normal ICD function See Pace Art report No changes today He is currently programmed VVI 60, originally had plans for AV nodal ablation.  Rates OK with some R shift.  Pt states Dr. Lalla Brothers told him no need to proceed with AV nodal ablation, as long as rates were OK.  Pt currently refuses any procedures or testing.   VF, appropriate shock Has also had frequent NSVT noted on his device.  Given cMRI findings concerning for  possible Sarcoid. I discussed the indications for PET scan and I identifying Sarcoid would alter his treatment plan.  He refuses at this time.  Continue amiodarone 200 mg BID for now due to the complexity of his case. Will discuss with Dr. Lalla Brothers.   Permanent AF Rates OK.  He is not interested in AV nodal ablation at this time.  Can consider down the road pending his rate control.  Currently programmed VVI 60. His conducted beats are relatively narrow, so for the  sake of battery life and pt preference, programming was not adjusted today.   PVCs On amiodarone as above.   Left lung opacity Noted on cMRI. CT recommended Pt refusing any further testing or procedures at this time until he follows up again with Dr. Lalla Brothers.   Disposition:   Follow up with Dr. Lalla Brothers as scheduled.   Signed, Graciella Freer, PA-C

## 2022-10-09 LAB — COMPREHENSIVE METABOLIC PANEL
ALT: 27 IU/L (ref 0–44)
AST: 25 IU/L (ref 0–40)
Albumin: 4.5 g/dL (ref 3.8–4.9)
Alkaline Phosphatase: 87 IU/L (ref 44–121)
BUN/Creatinine Ratio: 12 (ref 9–20)
BUN: 12 mg/dL (ref 6–24)
Bilirubin Total: 0.5 mg/dL (ref 0.0–1.2)
CO2: 21 mmol/L (ref 20–29)
Calcium: 9.7 mg/dL (ref 8.7–10.2)
Chloride: 99 mmol/L (ref 96–106)
Creatinine, Ser: 1.04 mg/dL (ref 0.76–1.27)
Globulin, Total: 3.5 g/dL (ref 1.5–4.5)
Glucose: 127 mg/dL — ABNORMAL HIGH (ref 70–99)
Potassium: 4.9 mmol/L (ref 3.5–5.2)
Sodium: 137 mmol/L (ref 134–144)
Total Protein: 8 g/dL (ref 6.0–8.5)
eGFR: 85 mL/min/{1.73_m2} (ref >59)

## 2022-10-09 LAB — T4, FREE: Free T4: 1.1 ng/dL (ref 0.82–1.77)

## 2022-10-09 LAB — TSH: TSH: 1.88 u[IU]/mL (ref 0.450–4.500)

## 2022-10-20 ENCOUNTER — Ambulatory Visit: Payer: 59 | Admitting: Student

## 2022-11-02 ENCOUNTER — Ambulatory Visit (INDEPENDENT_AMBULATORY_CARE_PROVIDER_SITE_OTHER): Payer: Commercial Managed Care - HMO

## 2022-11-02 DIAGNOSIS — I5022 Chronic systolic (congestive) heart failure: Secondary | ICD-10-CM | POA: Diagnosis not present

## 2022-11-02 DIAGNOSIS — I4821 Permanent atrial fibrillation: Secondary | ICD-10-CM

## 2022-11-03 LAB — CUP PACEART REMOTE DEVICE CHECK
Battery Remaining Longevity: 134 mo
Battery Voltage: 3.09 V
Brady Statistic RV Percent Paced: 27.51 %
Date Time Interrogation Session: 20241015204640
HighPow Impedance: 70 Ohm
Implantable Lead Connection Status: 753985
Implantable Lead Connection Status: 753985
Implantable Lead Implant Date: 20240715
Implantable Lead Implant Date: 20240715
Implantable Lead Location: 753859
Implantable Lead Location: 753860
Implantable Lead Model: 4598
Implantable Pulse Generator Implant Date: 20240715
Lead Channel Impedance Value: 3000 Ohm
Lead Channel Impedance Value: 342 Ohm
Lead Channel Impedance Value: 361 Ohm
Lead Channel Impedance Value: 380 Ohm
Lead Channel Impedance Value: 399 Ohm
Lead Channel Impedance Value: 399 Ohm
Lead Channel Impedance Value: 494 Ohm
Lead Channel Impedance Value: 494 Ohm
Lead Channel Impedance Value: 646 Ohm
Lead Channel Impedance Value: 665 Ohm
Lead Channel Impedance Value: 665 Ohm
Lead Channel Impedance Value: 684 Ohm
Lead Channel Impedance Value: 741 Ohm
Lead Channel Pacing Threshold Amplitude: 0.625 V
Lead Channel Pacing Threshold Pulse Width: 0.4 ms
Lead Channel Sensing Intrinsic Amplitude: 12.8 mV
Lead Channel Setting Pacing Amplitude: 2 V
Lead Channel Setting Pacing Amplitude: 3.5 V
Lead Channel Setting Pacing Pulse Width: 0.4 ms
Lead Channel Setting Pacing Pulse Width: 0.4 ms
Lead Channel Setting Sensing Sensitivity: 0.3 mV
Zone Setting Status: 755011

## 2022-11-22 NOTE — Progress Notes (Signed)
Remote ICD transmission.   

## 2022-11-24 ENCOUNTER — Other Ambulatory Visit: Payer: Self-pay | Admitting: Cardiology

## 2022-12-07 ENCOUNTER — Ambulatory Visit: Payer: 59 | Attending: Cardiology | Admitting: Cardiology

## 2022-12-07 ENCOUNTER — Encounter: Payer: Self-pay | Admitting: Cardiology

## 2022-12-07 VITALS — BP 128/78 | HR 93 | Ht 71.0 in | Wt 302.0 lb

## 2022-12-07 DIAGNOSIS — I4821 Permanent atrial fibrillation: Secondary | ICD-10-CM

## 2022-12-07 DIAGNOSIS — I5022 Chronic systolic (congestive) heart failure: Secondary | ICD-10-CM

## 2022-12-07 DIAGNOSIS — Z95810 Presence of automatic (implantable) cardiac defibrillator: Secondary | ICD-10-CM

## 2022-12-07 MED ORDER — METOPROLOL SUCCINATE ER 100 MG PO TB24: 100.0000 mg | ORAL_TABLET | Freq: Two times a day (BID) | ORAL | 3 refills | Status: DC
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
## 2022-12-07 NOTE — Patient Instructions (Signed)
Medication Instructions:  Your physician has recommended you make the following change in your medication:  1) INCREASE metoprolol succinate to 100 mg twice daily   *If you need a refill on your cardiac medications before your next appointment, please call your pharmacy*  Follow-Up: At [facility], you and your health needs are our priority.  As part of our continuing mission to provide you with exceptional heart care, we have created designated Provider Care Teams.  These Care Teams include your primary Cardiologist (physician) and Advanced Practice Providers (APPs -  Physician Assistants and Nurse Practitioners) who all work together to provide you with the care you need, when you need it.  Your next appointment:   6 months  Provider:   You will see one of the following Advanced Practice Providers on your designated Care Team:   Francis Dowse, Charlott Holler [address]" [place], [place] Sherie Don, NP Canary Brim, NP

## 2022-12-17 LAB — CUP PACEART INCLINIC DEVICE CHECK
Battery Remaining Longevity: 133 mo
Date Time Interrogation Session: 20241120162728
HighPow Impedance: 77 Ohm
Implantable Lead Connection Status: 753985
Implantable Lead Connection Status: 753985
Implantable Lead Implant Date: 20240715
Implantable Lead Implant Date: 20240715
Implantable Lead Location: 753859
Implantable Lead Location: 753860
Implantable Lead Model: 4598
Implantable Pulse Generator Implant Date: 20240715
Lead Channel Impedance Value: 3000 Ohm
Lead Channel Impedance Value: 3000 Ohm
Lead Channel Impedance Value: 456 Ohm
Lead Channel Impedance Value: 456 Ohm
Lead Channel Impedance Value: 513 Ohm
Lead Channel Pacing Threshold Amplitude: 0.5 V
Lead Channel Pacing Threshold Amplitude: 0.75 V
Lead Channel Pacing Threshold Pulse Width: 0.4 ms
Lead Channel Pacing Threshold Pulse Width: 0.4 ms
Lead Channel Sensing Intrinsic Amplitude: 13 mV
Lead Channel Setting Pacing Amplitude: 2 V
Lead Channel Setting Pacing Amplitude: 2.5 V
Lead Channel Setting Pacing Pulse Width: 0.4 ms
Lead Channel Setting Pacing Pulse Width: 0.4 ms
Lead Channel Setting Sensing Sensitivity: 0.3 mV

## 2023-01-13 ENCOUNTER — Inpatient Hospital Stay (HOSPITAL_COMMUNITY)
Admission: EM | Admit: 2023-01-13 | Discharge: 2023-01-16 | DRG: 309 | Disposition: A | Discharge status: Home / Self Care | Payer: 59 | Attending: Interventional Cardiology | Admitting: Interventional Cardiology

## 2023-01-13 ENCOUNTER — Emergency Department (HOSPITAL_COMMUNITY): Payer: 59

## 2023-01-13 ENCOUNTER — Other Ambulatory Visit: Payer: Self-pay

## 2023-01-13 ENCOUNTER — Encounter (HOSPITAL_COMMUNITY): Payer: Self-pay | Admitting: *Deleted

## 2023-01-13 DIAGNOSIS — I5020 Unspecified systolic (congestive) heart failure: Secondary | ICD-10-CM | POA: Diagnosis not present

## 2023-01-13 DIAGNOSIS — I110 Hypertensive heart disease with heart failure: Secondary | ICD-10-CM | POA: Diagnosis present

## 2023-01-13 DIAGNOSIS — Z95810 Presence of automatic (implantable) cardiac defibrillator: Secondary | ICD-10-CM

## 2023-01-13 DIAGNOSIS — G43909 Migraine, unspecified, not intractable, without status migrainosus: Secondary | ICD-10-CM | POA: Diagnosis present

## 2023-01-13 DIAGNOSIS — I4821 Permanent atrial fibrillation: Secondary | ICD-10-CM | POA: Diagnosis not present

## 2023-01-13 DIAGNOSIS — I4901 Ventricular fibrillation: Principal | ICD-10-CM | POA: Diagnosis present

## 2023-01-13 DIAGNOSIS — Z79899 Other long term (current) drug therapy: Secondary | ICD-10-CM

## 2023-01-13 DIAGNOSIS — I471 Supraventricular tachycardia, unspecified: Secondary | ICD-10-CM | POA: Diagnosis not present

## 2023-01-13 DIAGNOSIS — I499 Cardiac arrhythmia, unspecified: Secondary | ICD-10-CM

## 2023-01-13 DIAGNOSIS — Z8249 Family history of ischemic heart disease and other diseases of the circulatory system: Secondary | ICD-10-CM

## 2023-01-13 DIAGNOSIS — Z87891 Personal history of nicotine dependence: Secondary | ICD-10-CM

## 2023-01-13 DIAGNOSIS — I10 Essential (primary) hypertension: Secondary | ICD-10-CM | POA: Diagnosis not present

## 2023-01-13 DIAGNOSIS — Z91041 Radiographic dye allergy status: Secondary | ICD-10-CM

## 2023-01-13 DIAGNOSIS — Z811 Family history of alcohol abuse and dependence: Secondary | ICD-10-CM

## 2023-01-13 DIAGNOSIS — Z4502 Encounter for adjustment and management of automatic implantable cardiac defibrillator: Principal | ICD-10-CM

## 2023-01-13 DIAGNOSIS — R55 Syncope and collapse: Secondary | ICD-10-CM | POA: Diagnosis not present

## 2023-01-13 DIAGNOSIS — T82897A Other specified complication of cardiac prosthetic devices, implants and grafts, initial encounter: Secondary | ICD-10-CM | POA: Diagnosis not present

## 2023-01-13 DIAGNOSIS — R0989 Other specified symptoms and signs involving the circulatory and respiratory systems: Secondary | ICD-10-CM | POA: Diagnosis not present

## 2023-01-13 DIAGNOSIS — Z7901 Long term (current) use of anticoagulants: Secondary | ICD-10-CM

## 2023-01-13 DIAGNOSIS — Z5986 Financial insecurity: Secondary | ICD-10-CM

## 2023-01-13 DIAGNOSIS — E785 Hyperlipidemia, unspecified: Secondary | ICD-10-CM | POA: Diagnosis present

## 2023-01-13 DIAGNOSIS — I517 Cardiomegaly: Secondary | ICD-10-CM | POA: Diagnosis not present

## 2023-01-13 DIAGNOSIS — I5022 Chronic systolic (congestive) heart failure: Secondary | ICD-10-CM | POA: Diagnosis present

## 2023-01-13 DIAGNOSIS — I428 Other cardiomyopathies: Secondary | ICD-10-CM | POA: Diagnosis present

## 2023-01-13 DIAGNOSIS — E66813 Obesity, class 3: Secondary | ICD-10-CM | POA: Diagnosis present

## 2023-01-13 DIAGNOSIS — I472 Ventricular tachycardia, unspecified: Secondary | ICD-10-CM | POA: Diagnosis present

## 2023-01-13 DIAGNOSIS — Z833 Family history of diabetes mellitus: Secondary | ICD-10-CM

## 2023-01-13 DIAGNOSIS — I4891 Unspecified atrial fibrillation: Secondary | ICD-10-CM | POA: Diagnosis not present

## 2023-01-13 DIAGNOSIS — Z6841 Body Mass Index (BMI) 40.0 and over, adult: Secondary | ICD-10-CM

## 2023-01-13 DIAGNOSIS — R000 Tachycardia, unspecified: Secondary | ICD-10-CM | POA: Diagnosis not present

## 2023-01-13 LAB — CBC
HCT: 48.6 % (ref 39.0–52.0)
Hemoglobin: 16.2 g/dL (ref 13.0–17.0)
MCH: 31.8 pg (ref 26.0–34.0)
MCHC: 33.3 g/dL (ref 30.0–36.0)
MCV: 95.5 fL (ref 80.0–100.0)
Platelets: 238 10*3/uL (ref 150–400)
RBC: 5.09 MIL/uL (ref 4.22–5.81)
RDW: 14.1 % (ref 11.5–15.5)
WBC: 7.4 10*3/uL (ref 4.0–10.5)
nRBC: 0 % (ref 0.0–0.2)

## 2023-01-13 LAB — BASIC METABOLIC PANEL
Anion gap: 6 (ref 5–15)
BUN: 9 mg/dL (ref 6–20)
CO2: 28 mmol/L (ref 22–32)
Calcium: 9.2 mg/dL (ref 8.9–10.3)
Chloride: 101 mmol/L (ref 98–111)
Creatinine, Ser: 1.06 mg/dL (ref 0.61–1.24)
GFR, Estimated: >60 mL/min (ref >60)
Glucose, Bld: 233 mg/dL — ABNORMAL HIGH (ref 70–99)
Potassium: 4.5 mmol/L (ref 3.5–5.1)
Sodium: 135 mmol/L (ref 135–145)

## 2023-01-13 LAB — HEPATIC FUNCTION PANEL
ALT: 30 U/L (ref 0–44)
AST: 31 U/L (ref 15–41)
Albumin: 3.4 g/dL — ABNORMAL LOW (ref 3.5–5.0)
Alkaline Phosphatase: 54 U/L (ref 38–126)
Bilirubin, Direct: 0.4 mg/dL — ABNORMAL HIGH (ref 0.0–0.2)
Indirect Bilirubin: 0.3 mg/dL (ref 0.3–0.9)
Total Bilirubin: 0.7 mg/dL (ref <1.2)
Total Protein: 6.8 g/dL (ref 6.5–8.1)

## 2023-01-13 LAB — TROPONIN I (HIGH SENSITIVITY)
Troponin I (High Sensitivity): 22 ng/L — ABNORMAL HIGH (ref <18)
Troponin I (High Sensitivity): 72 ng/L — ABNORMAL HIGH (ref <18)

## 2023-01-13 LAB — MAGNESIUM: Magnesium: 1.8 mg/dL (ref 1.7–2.4)

## 2023-01-13 LAB — TSH: TSH: 1.631 u[IU]/mL (ref 0.350–4.500)

## 2023-01-13 MED ORDER — TORSEMIDE 20 MG PO TABS
10.0000 mg | ORAL_TABLET | Freq: Every day | ORAL | Status: DC
  Administered 2023-01-14: 10 mg via ORAL
  Filled 2023-01-13 (×3): qty 1

## 2023-01-13 MED ORDER — RIVAROXABAN 20 MG PO TABS
20.0000 mg | ORAL_TABLET | Freq: Every day | ORAL | Status: DC
  Administered 2023-01-14 – 2023-01-15 (×2): 20 mg via ORAL
  Filled 2023-01-13 (×2): qty 1

## 2023-01-13 MED ORDER — SACUBITRIL-VALSARTAN 24-26 MG PO TABS
1.0000 | ORAL_TABLET | Freq: Two times a day (BID) | ORAL | Status: DC
  Administered 2023-01-14 – 2023-01-16 (×5): 1 via ORAL
  Filled 2023-01-13 (×6): qty 1

## 2023-01-13 MED ORDER — AMIODARONE LOAD VIA INFUSION
150.0000 mg | Freq: Once | INTRAVENOUS | Status: AC
  Administered 2023-01-13: 150 mg via INTRAVENOUS
  Filled 2023-01-13: qty 83.34

## 2023-01-13 MED ORDER — METOPROLOL SUCCINATE ER 100 MG PO TB24
100.0000 mg | ORAL_TABLET | Freq: Two times a day (BID) | ORAL | Status: DC
  Administered 2023-01-14 – 2023-01-16 (×5): 100 mg via ORAL
  Filled 2023-01-13: qty 4
  Filled 2023-01-13 (×5): qty 1

## 2023-01-13 MED ORDER — AMIODARONE HCL 200 MG PO TABS: 400.0000 mg | ORAL_TABLET | Freq: Every day | ORAL | Status: DC

## 2023-01-13 MED ORDER — SPIRONOLACTONE 25 MG PO TABS
25.0000 mg | ORAL_TABLET | Freq: Every day | ORAL | Status: DC
  Administered 2023-01-14 – 2023-01-16 (×3): 25 mg via ORAL
  Filled 2023-01-13 (×3): qty 1

## 2023-01-13 MED ORDER — AMIODARONE HCL 200 MG PO TABS: 400.0000 mg | ORAL_TABLET | Freq: Two times a day (BID) | ORAL | Status: DC

## 2023-01-13 MED ORDER — ROSUVASTATIN CALCIUM 20 MG PO TABS
20.0000 mg | ORAL_TABLET | Freq: Every day | ORAL | Status: DC
  Administered 2023-01-14 – 2023-01-16 (×3): 20 mg via ORAL
  Filled 2023-01-13 (×3): qty 1

## 2023-01-13 MED ORDER — ONDANSETRON HCL 4 MG/2ML IJ SOLN: 4.0000 mg | Freq: Four times a day (QID) | INTRAMUSCULAR | Status: DC | PRN

## 2023-01-13 MED ORDER — ACETAMINOPHEN 325 MG PO TABS
650.0000 mg | ORAL_TABLET | ORAL | Status: DC | PRN
Start: 2023-01-13 — End: 2023-01-16

## 2023-01-13 MED ORDER — AMIODARONE HCL IN DEXTROSE 360-4.14 MG/200ML-% IV SOLN
30.0000 mg/h | INTRAVENOUS | Status: AC
  Administered 2023-01-14 – 2023-01-15 (×3): 30 mg/h via INTRAVENOUS
  Filled 2023-01-13 (×3): qty 200

## 2023-01-13 MED ORDER — MAGNESIUM SULFATE 2 GM/50ML IV SOLN
2.0000 g | Freq: Once | INTRAVENOUS | Status: AC
  Administered 2023-01-13: 2 g via INTRAVENOUS
  Filled 2023-01-13: qty 50

## 2023-01-13 MED ORDER — DAPAGLIFLOZIN PROPANEDIOL 10 MG PO TABS
10.0000 mg | ORAL_TABLET | Freq: Every day | ORAL | Status: DC
  Administered 2023-01-14 – 2023-01-16 (×3): 10 mg via ORAL
  Filled 2023-01-13 (×3): qty 1

## 2023-01-13 MED ORDER — AMIODARONE HCL IN DEXTROSE 360-4.14 MG/200ML-% IV SOLN
60.0000 mg/h | INTRAVENOUS | Status: AC
  Administered 2023-01-13 – 2023-01-14 (×2): 60 mg/h via INTRAVENOUS
  Filled 2023-01-13 (×2): qty 200

## 2023-01-13 NOTE — ED Provider Notes (Signed)
[name]   CSN: [id number] Arrival date & time: 01/13/23  1931     History {Add pertinent medical, surgical, social history, OB history to HPI:1} No chief complaint on file.   William Fischer is a 57 y.o. male.  57 year old male with history of CHF (EF 35 to 40%) status post Medtronic defibrillator, atrial fibrillation on Xarelto, ventricular fibrillation, hypertension, and hyperlipidemia who presented to the emergency department after a defibrillator discharge.  Patient reports that he was at home and he was standing up when he felt something like an electric shock in his chest.  He said that it brought him to his knees and he laid on the ground for a brief period of time.  We attempted to get back up it shocked him again.  Called 911 and they were with him as heart rate got into the 200s when he was attempting to stand and was shocked again.  Had this happen in September as well.  No chest pain, shortness of breath, dizziness, recent illnesses or alcohol or caffeine use.       Home Medications Prior to Admission medications   Medication Sig Start Date End Date Taking? Authorizing Provider  acetaminophen (TYLENOL) 500 MG tablet Take 2 tablets (1,000 mg total) by mouth every 6 (six) hours as needed (pain). 09/25/22   William Parker, PA-C  Ascorbic Acid (VITAMIN C PO) Take 1 tablet by mouth daily.    [provider]  b complex vitamins capsule Take 1 capsule by mouth daily.    [provider]  dapagliflozin propanediol (FARXIGA) 10 MG TABS tablet TAKE 1 TABLET BY MOUTH EVERY DAY 11/25/22   William Ishikawa, MD  Magnesium Oxide 400 MG CAPS Take 1 capsule (400 mg total) by mouth daily. 08/29/22   William Prude, MD  metoprolol succinate (TOPROL-XL) 100 MG 24 hr tablet Take 1 tablet (100 mg total) by mouth in the morning and at bedtime. Take with or immediately following a meal. 12/07/22   William Prude, MD  Multiple Vitamins-Minerals (ONE-A-DAY 50 PLUS PO) Take 1 capsule by mouth daily in the afternoon.    [provider]  rivaroxaban (XARELTO) 20 MG TABS tablet TAKE 1 TABLET BY MOUTH DAILY WITH SUPPER. 01/31/22   William Ishikawa, MD  rosuvastatin (CRESTOR) 20 MG tablet TAKE 1 TABLET BY MOUTH EVERY DAY 11/25/22   William Ishikawa, MD  sacubitril-valsartan (ENTRESTO) 24-26 MG Take 1 tablet by mouth 2 (two) times daily. 02/08/22   William Ishikawa, MD  spironolactone (ALDACTONE) 25 MG tablet Take 1 tablet (25 mg total) by mouth daily. 09/22/21   William Ishikawa, MD  torsemide (DEMADEX) 10 MG tablet TAKE 1 TABLET BY MOUTH EVERY DAY 11/25/22   William Ishikawa, MD      Allergies    Contrast media [iodinated contrast media] and Tramadol    Review of Systems   Review of Systems  Physical Exam Updated Vital Signs BP 134/85 (BP Location: Right Arm)   Pulse (!) 101   Temp 97.8 F (36.6 C) (Oral)   Resp (!) 22   Ht 5\' 11"  (1.803 m)   Wt (!) 146.1 kg   SpO2 99%   BMI 44.91 kg/m  Physical Exam Vitals and nursing note reviewed.  Constitutional:      General: He is not in acute distress.    Appearance: He is well-developed.  HENT:     Head:  Normocephalic and atraumatic.     Right Ear: External ear normal.     Left Ear: External ear normal.     Nose: Nose normal.  Eyes:     Extraocular Movements: Extraocular movements intact.     Conjunctiva/sclera: Conjunctivae normal.     Pupils: Pupils are equal, round, and reactive to light.  Cardiovascular:     Rate and Rhythm: Normal rate and regular rhythm.     Heart sounds: Normal heart sounds.     Comments: ICD in left chest wall Pulmonary:     Effort: Pulmonary effort is normal. No respiratory distress.     Breath sounds: Normal breath sounds.  Musculoskeletal:     Cervical back: Normal range of motion and neck supple.     Right lower leg: No edema.     Left lower leg: No edema.   Skin:    General: Skin is warm and dry.  Neurological:     Mental Status: He is alert. Mental status is at baseline.  Psychiatric:        Mood and Affect: Mood normal.        Behavior: Behavior normal.     ED Results / Procedures / Treatments   Labs (all labs ordered are listed, but only abnormal results are displayed) Labs Reviewed  BASIC METABOLIC PANEL  CBC  MAGNESIUM  TROPONIN I (HIGH SENSITIVITY)    EKG None  Radiology No results found.  Procedures Procedures  {Document cardiac monitor, telemetry assessment procedure when appropriate:1}  Medications Ordered in ED Medications - No data to display  ED Course/ Medical Decision Making/ A&P   {   Click here for ABCD2, HEART and other calculatorsREFRESH Note before signing :1}                              Medical Decision Making Amount and/or Complexity of Data Reviewed Labs: ordered. Radiology: ordered.   ***  {Document critical care time when appropriate:1} {Document review of labs and clinical decision tools ie heart score, Chads2Vasc2 etc:1}  {Document your independent review of radiology images, and any outside records:1} {Document your discussion with family members, caretakers, and with consultants:1} {Document social determinants of health affecting pt's care:1} {Document your decision making why or why not admission, treatments were needed:1} Final Clinical Impression(s) / ED Diagnoses Final diagnoses:  None    Rx / DC Orders ED Discharge Orders     None

## 2023-01-13 NOTE — ED Notes (Signed)
Medtronic interrogation complete.

## 2023-01-13 NOTE — ED Triage Notes (Signed)
Pt arrives via [facility] from home. Per report, pt has a pacemaker/defib. Shocked 5 times by his defibrillator, He reported he lowered himself to the ground- Says he was shocked twice, passed out on ground, shocked him again. Hx of AFIB. Alert and oriented. When he stood up, hr went up to around 200 in AFIB. Couplet PVC's 140/80, 97% on RA. IV established in the left [facility].

## 2023-01-13 NOTE — H&P (Signed)
Cardiology Admission History and Physical   Patient ID: William Fischer MRN: [id number]; DOB: [birth year]   Admission date: 01/13/2023  PCP:  Oneita Hurt No   [facility] Providers Cardiologist:  Little Ishikawa, MD  Electrophysiologist:  Lanier Prude, MD       Chief Complaint:  ICD shocks  Patient Profile:   William Fischer is a 57 y.o. male with NICM, HFrEF 30-35%, VT s/p MDT CRT-D 08/01/22, permanent AF who is being seen 01/13/2023 for the evaluation of ICD shocks.   History of Present Illness:   William Fischer is a 57 y.o. male with NICM, HFrEF 30-35%, VT s/p MDT CRT-D 08/01/22, permanent AF who is being seen 01/13/2023 for the evaluation of ICD shocks. He was previously admitted on 09/25/22 for ICD shocks. He was started on amiodarone and discharged. He tolerated the medication but self-discontinued after a few days because he read online about the potential side effects. He has been doing well since then. He has not had any chest pain, dyspnea, orthopnea, lower extremity edema. He was in his kitchen today and suddenly felt 6 ICD shocks which occurred consecutively. He fell to his knees and then laid down on the floor. He did not hit his head. He thinks he did lose consciousness briefly while lying down on the floor. He called EMS and was told that his HR was 200 bpm. He was brought the ED. EKG showed rate-controlled AF. Troponin 22. Currently reporting no symptoms. Discussed options including amiodarone and other antiarrhythmics such as mexiletine and quinidine. He opted to try amiodarone with close monitoring for toxicity. Interrogation shows episode in the VF zone (>188 bpm) that received ATP x2 which was unsuccessful then received 6 shocks.        Past Medical History:  Diagnosis Date   Allergy to IVP dye    Atrial fibrillation (HCC)    admx 11/13 with acute sCHF in setting of RVR  => a. failed DCCV x 2; b. Pradaxa started;  c. failed sotalol   Chicken pox  as a child   Chronic anticoagulation    Pradaxa   Chronic systolic heart failure (HCC)    a. echo 11/13: Ef 40-45%, diff HK, mod MR, mod LAE, mild RVE, mod RAE, small effusion;   b. 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   Migraine 06/29/2011   Obesity 06/29/2011   Snoring    patient needs sleep study - has declined    Past Surgical History:  Procedure Laterality Date   BIV ICD INSERTION CRT-D N/A 08/01/2022   Procedure: BIV ICD INSERTION CRT-D;  Surgeon: Lanier Prude, MD;  Location: [facility] INVASIVE CV LAB;  Service: Cardiovascular;  Laterality: N/A;   CARDIOVERSION  11/30/2011   Procedure: CARDIOVERSION;  Surgeon: Vesta Mixer, MD;  Location: [facility] ENDOSCOPY;  Service: Cardiovascular;  Laterality: N/A;   CARDIOVERSION  12/03/2011   Procedure: CARDIOVERSION;  Surgeon: Dolores Patty, MD;  Location: [facility] OR;  Service: Cardiovascular;  Laterality: N/A;   COLECTOMY  12/07/2015   partial   COLON SURGERY     COLOSTOMY REVERSAL  12/07/2015   COLOSTOMY REVERSAL N/A 12/07/2015   Procedure: OPEN REVERSAL OF COLOSTOMY WITH PARTIAL SIGMOID COLECTOMY;  Surgeon: Romie Levee, MD;  Location: [facility] OR;  Service: General;  Laterality: N/A;   HERNIA REPAIR     INCISIONAL  HERNIA REPAIR  12/07/2015   INCISIONAL HERNIA REPAIR N/A 12/07/2015   Procedure: INCISIONAL HERNIA REPAIR;  Surgeon: Romie Levee, MD;  Location: [facility] OR;  Service: General;  Laterality: N/A;   IR GENERIC HISTORICAL  08/11/2015   IR RADIOLOGIST EVAL & MGMT 08/11/2015 Richarda Overlie, MD [facility] INTERV RAD   IR GENERIC HISTORICAL  11/18/2015   IR CATHETER TUBE CHANGE 11/18/2015 Gilmer Mor, DO [facility]   IR GENERIC HISTORICAL  10/15/2015   IR RADIOLOGIST EVAL & MGMT 10/15/2015 Malachy Moan, MD [facility] INTERV RAD   IR GENERIC HISTORICAL  01/27/2016   IR RADIOLOGIST EVAL & MGMT 01/27/2016 Brayton El, PA-C [facility] INTERV RAD    IR GENERIC HISTORICAL  01/28/2016   IR CATHETER TUBE CHANGE 01/28/2016 Richarda Overlie, MD [facility]   IR GENERIC HISTORICAL  02/17/2016   IR RADIOLOGIST EVAL & MGMT 02/17/2016 Brayton El, PA-C [facility] INTERV RAD   LAPAROTOMY N/A 01/17/2015   Procedure: EXPLORATORY LAPAROTOMY WITH LEFT COLECTOMY AND COLOSTOMY;  Surgeon: Emelia Loron, MD;  Location: [facility] OR;  Service: General;  Laterality: N/A;   PERIPHERALLY INSERTED CENTRAL CATHETER INSERTION  11/2015   RIGHT/LEFT HEART CATH AND CORONARY ANGIOGRAPHY N/A 01/12/2022   Procedure: RIGHT/LEFT HEART CATH AND CORONARY ANGIOGRAPHY;  Surgeon: Tonny Bollman, MD;  Location: [facility] INVASIVE CV LAB;  Service: Cardiovascular;  Laterality: N/A;   TEE WITHOUT CARDIOVERSION  11/30/2011   Procedure: TRANSESOPHAGEAL ECHOCARDIOGRAM (TEE);  Surgeon: Vesta Mixer, MD;  Location: [facility] ENDOSCOPY;  Service: Cardiovascular;  Laterality: N/A;   TONSILLECTOMY       Medications Prior to Admission: Prior to Admission medications   Medication Sig Start Date End Date Taking? Authorizing Provider  acetaminophen (TYLENOL) 500 MG tablet Take 2 tablets (1,000 mg total) by mouth every 6 (six) hours as needed (pain). 09/25/22   Corrin Parker, PA-C  Ascorbic Acid (VITAMIN C PO) Take 1 tablet by mouth daily.    [provider]  b complex vitamins capsule Take 1 capsule by mouth daily.    [provider]  dapagliflozin propanediol (FARXIGA) 10 MG TABS tablet TAKE 1 TABLET BY MOUTH EVERY DAY 11/25/22   Little Ishikawa, MD  Magnesium Oxide 400 MG CAPS Take 1 capsule (400 mg total) by mouth daily. 08/29/22   Lanier Prude, MD  metoprolol succinate (TOPROL-XL) 100 MG 24 hr tablet Take 1 tablet (100 mg total) by mouth in the morning and at bedtime. Take with or immediately following a meal. 12/07/22   Lanier Prude, MD  Multiple Vitamins-Minerals (ONE-A-DAY 50 PLUS PO) Take 1 capsule by mouth daily in the afternoon.    [provider]   rivaroxaban (XARELTO) 20 MG TABS tablet TAKE 1 TABLET BY MOUTH DAILY WITH SUPPER. 01/31/22   Little Ishikawa, MD  rosuvastatin (CRESTOR) 20 MG tablet TAKE 1 TABLET BY MOUTH EVERY DAY 11/25/22   Little Ishikawa, MD  sacubitril-valsartan (ENTRESTO) 24-26 MG Take 1 tablet by mouth 2 (two) times daily. 02/08/22   Little Ishikawa, MD  spironolactone (ALDACTONE) 25 MG tablet Take 1 tablet (25 mg total) by mouth daily. 09/22/21   Little Ishikawa, MD  torsemide (DEMADEX) 10 MG tablet TAKE 1 TABLET BY MOUTH EVERY DAY 11/25/22   Little Ishikawa, MD     Allergies:    Allergies  Allergen Reactions   Contrast Media [Iodinated Contrast Media] Rash    a diffuse macular rash after CTA chest  Pt was premedicated with 125mg  IV Solumedrol, 50mg  IV Benadryl  1 hr prior to CTexam, and tolerated procedure without any difficulties. 11/28/11 Also same pre med protocol observed on 02/22/15 and pt tolerated the procedure well   Tramadol     Social History:   Social History   Socioeconomic History   Marital status: Divorced    Spouse name: Not on file   Number of children: Not on file   Years of education: Not on file   Highest education level: Not on file  Occupational History   Not on file  Tobacco Use   Smoking status: Former    Current packs/day: 0.00    Average packs/day: 1 pack/day for 20.0 years (20.0 ttl pk-yrs)    Types: Cigarettes    Start date: 07/10/1994    Quit date: 07/10/2014    Years since quitting: 8.5   Smokeless tobacco: Never  Substance and Sexual Activity   Alcohol use: No    Alcohol/week: 0.0 standard drinks of alcohol   Drug use: No   Sexual activity: Never  Other Topics Concern   Not on file  Social History Narrative   Not on file   Social Drivers of Health   Financial Resource Strain: Medium Risk (02/09/2022)   Overall Financial Resource Strain (CARDIA)    Difficulty of Paying Living Expenses: Somewhat hard  Food Insecurity: Low Risk   (02/17/2022)   [facility]   Hunger Vital Sign    Worried About Running Out of Food in the Last Year: Never true    Within the past 12 months, the food you bought just didn't last and you didn't have money to get more: Not on file  Transportation Needs: No Transportation Needs (02/17/2022)   [facility]   Transportation    In the past 12 months, has lack of reliable transportation kept you from medical appointments, meetings, work or from getting things needed for daily living? : No  Physical Activity: Not on file  Stress: Not on file  Social Connections: Unknown (05/28/2021)   [facility]   Social Network    Social Network: Not on file  Intimate Partner Violence: Unknown (04/19/2021)   [facility] from [facility], [facility]   HITS    Physically Hurt: Not on file    Insult or Talk Down To: Not on file    Threaten Physical Harm: Not on file    Scream or Curse: Not on file    Family History:   The patient's family history includes Alcohol abuse in his father and paternal grandfather; Aneurysm in his father; Cancer in his maternal grandmother and paternal grandmother; Diabetes in his maternal grandfather and paternal grandmother; Hypertension in his father and mother; Parkinsonism in his maternal grandfather.    ROS:  Please see the history of present illness.  All other ROS reviewed and negative.     Physical Exam/Data:   Vitals:   01/13/23 1945 01/13/23 2015 01/13/23 2115 01/13/23 2130  BP: 114/74 118/69 123/71 123/83  Pulse: 96 86 91 94  Resp: 20 15 19    Temp:      TempSrc:      SpO2: 94% 96% 97% 97%  Weight:      Height:       No intake or output data in the 24 hours ending 01/13/23 2157    01/13/2023    7:35 PM 12/07/2022    4:10 PM 10/07/2022   12:21 PM  Last 3 Weights  Weight (lbs)  322 lb 302 lb 291 lb 6.4 oz  Weight (kg) 146.058 kg 136.986 kg 132.178 kg     Body mass  index is 44.91 kg/m.  General:  Well nourished, well developed, in no acute distress HEENT: normal Neck: no JVD Vascular: No carotid bruits; Distal pulses 2+ bilaterally   Cardiac:  normal S1, S2; RRR; no murmur  Lungs:  clear to auscultation bilaterally, no wheezing, rhonchi or rales  Abd: soft, nontender, no hepatomegaly  Ext: no edema Musculoskeletal:  No deformities, BUE and BLE strength normal and equal Skin: warm and dry  Neuro:  CNs 2-12 intact, no focal abnormalities noted Psych:  Normal affect    EKG:  The ECG that was done was personally reviewed    Laboratory Data:  High Sensitivity Troponin:   Recent Labs  Lab 01/13/23 1940  TROPONINIHS 22*      Chemistry Recent Labs  Lab 01/13/23 1940  NA 135  K 4.5  CL 101  CO2 28  GLUCOSE 233*  BUN 9  CREATININE 1.06  CALCIUM 9.2  MG 1.8  GFRNONAA >60  ANIONGAP 6    No results for input(s): "PROT", "ALBUMIN", "AST", "ALT", "ALKPHOS", "BILITOT" in the last 168 hours. Lipids No results for input(s): "CHOL", "TRIG", "HDL", "LABVLDL", "LDLCALC", "CHOLHDL" in the last 168 hours. Hematology Recent Labs  Lab 01/13/23 1940  WBC 7.4  RBC 5.09  HGB 16.2  HCT 48.6  MCV 95.5  MCH 31.8  MCHC 33.3  RDW 14.1  PLT 238   Thyroid No results for input(s): "TSH", "FREET4" in the last 168 hours. BNPNo results for input(s): "BNP", "PROBNP" in the last 168 hours.  DDimer No results for input(s): "DDIMER" in the last 168 hours.   Radiology/Studies:  DG Chest Port 1 View Result Date: 01/13/2023 CLINICAL DATA:  Atrial fibrillation. EXAM: PORTABLE CHEST 1 VIEW COMPARISON:  09/25/2022 FINDINGS: Chronic cardiomegaly. Left-sided pacemaker in place. Mediastinal contours are stable. No acute airspace disease, large pleural effusion, or pneumothorax. The right costophrenic angle is not entirely included in the field of view. Slight vascular congestion without pulmonary edema. IMPRESSION: Chronic cardiomegaly with slight vascular  congestion. Electronically Signed   By: Narda Rutherford M.D.   On: 01/13/2023 19:51     Assessment and Plan:   William Fischer is a 56 y.o. male with NICM, HFrEF 30-35%, VT s/p MDT CRT-D 08/01/22, permanent AF who is being seen 01/13/2023 for the evaluation of ICD shocks.   VT/VF - start amiodarone gtt followed by oral load - continue metoprolol succinate 100 mg bid - replete mg   HFrEF - metoprolol succinate 100 mg bid - entresto 24/26 mg bid - spironolactone 25 mg daily - dapagliflozin 10 mg daily - torsemide 10 mg daily  Permanent AF - xarelto 20 mg daily - metoprolol and amiodarone as above   Risk Assessment/Risk Scores:       Code Status: Full Code  Severity of Illness: The appropriate patient status for this patient is OBSERVATION. Observation status is judged to be reasonable and necessary in order to provide the required intensity of service to ensure the patient's safety. The patient's presenting symptoms, physical exam findings, and initial radiographic and laboratory data in the context of their medical condition is felt to place them at decreased risk for further clinical deterioration. Furthermore, it is anticipated that the patient will be medically stable for discharge from the hospital within 2 midnights of admission.    For questions or updates, please contact [facility] Please consult [email or web address] for contact info under     Signed, Lorinda Creed  01/13/2023 9:57 PM

## 2023-01-13 NOTE — ED Notes (Signed)
Dr. Lalla Brothers is his cardiologist

## 2023-01-14 DIAGNOSIS — G43909 Migraine, unspecified, not intractable, without status migrainosus: Secondary | ICD-10-CM | POA: Diagnosis not present

## 2023-01-14 DIAGNOSIS — Z6841 Body Mass Index (BMI) 40.0 and over, adult: Secondary | ICD-10-CM | POA: Diagnosis not present

## 2023-01-14 DIAGNOSIS — Z87891 Personal history of nicotine dependence: Secondary | ICD-10-CM | POA: Diagnosis not present

## 2023-01-14 DIAGNOSIS — Z811 Family history of alcohol abuse and dependence: Secondary | ICD-10-CM | POA: Diagnosis not present

## 2023-01-14 DIAGNOSIS — Z8249 Family history of ischemic heart disease and other diseases of the circulatory system: Secondary | ICD-10-CM | POA: Diagnosis not present

## 2023-01-14 DIAGNOSIS — Z95810 Presence of automatic (implantable) cardiac defibrillator: Secondary | ICD-10-CM | POA: Diagnosis not present

## 2023-01-14 DIAGNOSIS — I5022 Chronic systolic (congestive) heart failure: Secondary | ICD-10-CM | POA: Diagnosis not present

## 2023-01-14 DIAGNOSIS — E66813 Obesity, class 3: Secondary | ICD-10-CM | POA: Diagnosis not present

## 2023-01-14 DIAGNOSIS — I110 Hypertensive heart disease with heart failure: Secondary | ICD-10-CM | POA: Diagnosis not present

## 2023-01-14 DIAGNOSIS — I4901 Ventricular fibrillation: Secondary | ICD-10-CM | POA: Diagnosis not present

## 2023-01-14 DIAGNOSIS — Z833 Family history of diabetes mellitus: Secondary | ICD-10-CM | POA: Diagnosis not present

## 2023-01-14 DIAGNOSIS — Z4502 Encounter for adjustment and management of automatic implantable cardiac defibrillator: Secondary | ICD-10-CM | POA: Diagnosis not present

## 2023-01-14 DIAGNOSIS — Z5986 Financial insecurity: Secondary | ICD-10-CM | POA: Diagnosis not present

## 2023-01-14 DIAGNOSIS — Z7901 Long term (current) use of anticoagulants: Secondary | ICD-10-CM | POA: Diagnosis not present

## 2023-01-14 DIAGNOSIS — Z91041 Radiographic dye allergy status: Secondary | ICD-10-CM | POA: Diagnosis not present

## 2023-01-14 DIAGNOSIS — I472 Ventricular tachycardia, unspecified: Secondary | ICD-10-CM | POA: Diagnosis not present

## 2023-01-14 DIAGNOSIS — I4821 Permanent atrial fibrillation: Secondary | ICD-10-CM | POA: Diagnosis not present

## 2023-01-14 DIAGNOSIS — I428 Other cardiomyopathies: Secondary | ICD-10-CM | POA: Diagnosis not present

## 2023-01-14 DIAGNOSIS — Z79899 Other long term (current) drug therapy: Secondary | ICD-10-CM | POA: Diagnosis not present

## 2023-01-14 DIAGNOSIS — E785 Hyperlipidemia, unspecified: Secondary | ICD-10-CM | POA: Diagnosis not present

## 2023-01-14 MED ORDER — AMIODARONE HCL 200 MG PO TABS: 400.0000 mg | ORAL_TABLET | Freq: Every day | ORAL | Status: DC

## 2023-01-14 MED ORDER — AMIODARONE HCL 200 MG PO TABS
400.0000 mg | ORAL_TABLET | Freq: Two times a day (BID) | ORAL | Status: DC
  Administered 2023-01-15 – 2023-01-16 (×2): 400 mg via ORAL
  Filled 2023-01-14 (×2): qty 2

## 2023-01-14 NOTE — ED Notes (Signed)
ED TO INPATIENT HANDOFF REPORT  ED Nurse Name and Phone #: Richarda Osmond Name/Age/Gender William Fischer 57 y.o. male Room/Bed: 036C/036C  Code Status   Code Status: Full Code  Home/SNF/Other Home Patient oriented to: self, place, time, and situation Is this baseline? Yes   Triage Complete: Triage complete  Chief Complaint Ventricular fibrillation (HCC) [I49.01]  Triage Note Pt arrives via [facility] from home. Per report, pt has a pacemaker/defib. Shocked 5 times by his defibrillator, He reported he lowered himself to the ground- Says he was shocked twice, passed out on ground, shocked him again. Hx of AFIB. Alert and oriented. When he stood up, hr went up to around 200 in AFIB. Couplet PVC's 140/80, 97% on RA. IV established in the left [facility].    Allergies Allergies  Allergen Reactions   Contrast Media [Iodinated Contrast Media] Rash    a diffuse macular rash after CTA chest  Pt was premedicated with 125mg  IV Solumedrol, 50mg  IV Benadryl 1 hr prior to CTexam, and tolerated procedure without any difficulties. 11/28/11 Also same pre med protocol observed on 02/22/15 and pt tolerated the procedure well   Tramadol     Level of Care/Admitting Diagnosis ED Disposition     ED Disposition  Admit   Condition  --   Comment  Hospital Area: [facility] [[id number]]  Level of Care: Telemetry Cardiac [[id number]]  May place patient in observation at [facility] or [facility] if equivalent level of care is available:: No  Covid Evaluation: Asymptomatic - no recent exposure (last 10 days) testing not required  Diagnosis: Ventricular fibrillation (HCC) [427.41.ICD-9-CM]  Admitting Physician: Lorinda Creed [[id number]]  Attending Physician: Dolores Patty [[id number]]          B Medical/Surgery History Past Medical History:  Diagnosis Date   Allergy to IVP dye    Atrial fibrillation (HCC)    admx 11/13 with acute sCHF in setting of RVR  => a. failed DCCV x 2; b. Pradaxa started;   c. failed sotalol   Chicken pox as a child   Chronic anticoagulation    Pradaxa   Chronic systolic heart failure (HCC)    a. echo 11/13: Ef 40-45%, diff HK, mod MR, mod LAE, mild RVE, mod RAE, small effusion;   b. 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   Migraine 06/29/2011   Obesity 06/29/2011   Snoring    patient needs sleep study - has declined   Past Surgical History:  Procedure Laterality Date   BIV ICD INSERTION CRT-D N/A 08/01/2022   Procedure: BIV ICD INSERTION CRT-D;  Surgeon: Lanier Prude, MD;  Location: [facility] INVASIVE CV LAB;  Service: Cardiovascular;  Laterality: N/A;   CARDIOVERSION  11/30/2011   Procedure: CARDIOVERSION;  Surgeon: Vesta Mixer, MD;  Location: [facility] ENDOSCOPY;  Service: Cardiovascular;  Laterality: N/A;   CARDIOVERSION  12/03/2011   Procedure: CARDIOVERSION;  Surgeon: Dolores Patty, MD;  Location: [facility] OR;  Service: Cardiovascular;  Laterality: N/A;   COLECTOMY  12/07/2015   partial   COLON SURGERY     COLOSTOMY REVERSAL  12/07/2015   COLOSTOMY REVERSAL N/A 12/07/2015   Procedure: OPEN REVERSAL OF COLOSTOMY WITH PARTIAL SIGMOID COLECTOMY;  Surgeon: Romie Levee, MD;  Location: [facility] OR;  Service: General;  Laterality: N/A;   HERNIA REPAIR     INCISIONAL HERNIA REPAIR  12/07/2015   INCISIONAL HERNIA REPAIR N/A 12/07/2015   Procedure: INCISIONAL HERNIA REPAIR;  Surgeon: Romie Levee, MD;  Location: [facility] OR;  Service: General;  Laterality: N/A;   IR GENERIC HISTORICAL  08/11/2015   IR RADIOLOGIST EVAL & MGMT 08/11/2015 Richarda Overlie, MD [facility] INTERV RAD   IR GENERIC HISTORICAL  11/18/2015   IR CATHETER TUBE CHANGE 11/18/2015 Gilmer Mor, DO [facility]   IR GENERIC HISTORICAL  10/15/2015   IR RADIOLOGIST EVAL & MGMT 10/15/2015 Malachy Moan, MD [facility] INTERV RAD   IR GENERIC HISTORICAL  01/27/2016   IR RADIOLOGIST EVAL & MGMT 01/27/2016 Brayton El, PA-C [facility] INTERV RAD   IR GENERIC HISTORICAL  01/28/2016   IR CATHETER TUBE CHANGE 01/28/2016 Richarda Overlie, MD [facility]   IR GENERIC HISTORICAL  02/17/2016   IR RADIOLOGIST EVAL & MGMT 02/17/2016 Brayton El, PA-C [facility] INTERV RAD   LAPAROTOMY N/A 01/17/2015   Procedure: EXPLORATORY LAPAROTOMY WITH LEFT COLECTOMY AND COLOSTOMY;  Surgeon: Emelia Loron, MD;  Location: [facility] OR;  Service: General;  Laterality: N/A;   PERIPHERALLY INSERTED CENTRAL CATHETER INSERTION  11/2015   RIGHT/LEFT HEART CATH AND CORONARY ANGIOGRAPHY N/A 01/12/2022   Procedure: RIGHT/LEFT HEART CATH AND CORONARY ANGIOGRAPHY;  Surgeon: Tonny Bollman, MD;  Location: [facility] INVASIVE CV LAB;  Service: Cardiovascular;  Laterality: N/A;   TEE WITHOUT CARDIOVERSION  11/30/2011   Procedure: TRANSESOPHAGEAL ECHOCARDIOGRAM (TEE);  Surgeon: Vesta Mixer, MD;  Location: [facility] ENDOSCOPY;  Service: Cardiovascular;  Laterality: N/A;   TONSILLECTOMY       A IV Location/Drains/Wounds Patient Lines/Drains/Airways Status     Active Line/Drains/Airways     Name Placement date Placement time Site Days   Peripheral IV 09/25/22 20 G Left Hand 09/25/22  0015  Hand  111   Peripheral IV 01/13/23 18 G Anterior;Distal;Left;Upper Arm 01/13/23  1938  Arm  1   Closed System Drain 1 Left Abdomen Bulb (JP) 14 Fr. 01/28/16  1105  Abdomen  2543   Wound / Incision (Open or Dehisced) 01/09/16 Other (Comment) Abdomen Right 01/09/16  0351  Abdomen  2562            Intake/Output Last 24 hours No intake or output data in the 24 hours ending 01/14/23 1357  Labs/Imaging Results for orders placed or performed during the hospital encounter of 01/13/23 (from the past 48 hours)  Basic metabolic panel     Status: Abnormal   Collection Time: 01/13/23  7:40 PM  Result Value Ref Range   Sodium 135 135 - 145 mmol/L   Potassium 4.5 3.5 - 5.1 mmol/L   Chloride 101 98 - 111 mmol/L   CO2 28 22 - 32 mmol/L   Glucose, Bld 233 (H) 70 - 99 mg/dL     Comment: Glucose reference range applies only to samples taken after fasting for at least 8 hours.   BUN 9 6 - 20 mg/dL   Creatinine, Ser 7.82 0.61 - 1.24 mg/dL   Calcium 9.2 8.9 - 95.6 mg/dL   GFR, Estimated >21 >30 mL/min    Comment: (NOTE) Calculated using the CKD-EPI Creatinine Equation (2021)    Anion gap 6 5 - 15    Comment: Performed at [facility], [address], [place], [place] [address]  CBC     Status: None   Collection Time: 01/13/23  7:40 PM  Result Value Ref Range   WBC 7.4 4.0 - 10.5 K/uL   RBC 5.09 4.22 - 5.81 MIL/uL   Hemoglobin  16.2 13.0 - 17.0 g/dL   HCT 16.1 09.6 - 04.5 %   MCV 95.5 80.0 - 100.0 fL   MCH 31.8 26.0 - 34.0 pg   MCHC 33.3 30.0 - 36.0 g/dL   RDW 40.9 81.1 - 91.4 %   Platelets 238 150 - 400 K/uL   nRBC 0.0 0.0 - 0.2 %    Comment: Performed at [facility], [address] [place], [place] [address]  Troponin I (High Sensitivity)     Status: Abnormal   Collection Time: 01/13/23  7:40 PM  Result Value Ref Range   Troponin I (High Sensitivity) 22 (H) <18 ng/L    Comment: (NOTE) Elevated high sensitivity troponin I (hsTnI) values and significant  changes across serial measurements may suggest ACS but many other  chronic and acute conditions are known to elevate hsTnI results.  Refer to the "Links" section for chest pain algorithms and additional  guidance. Performed at [facility], [address] [place], [place] [address]   Magnesium     Status: None   Collection Time: 01/13/23  7:40 PM  Result Value Ref Range   Magnesium 1.8 1.7 - 2.4 mg/dL    Comment: Performed at [facility], [address] [place], [place] [address]  TSH     Status: None   Collection Time: 01/13/23  7:40 PM  Result Value Ref Range   TSH 1.631 0.350 - 4.500 uIU/mL    Comment: Performed by a 3rd Generation assay with a functional sensitivity of <=0.01 uIU/mL. Performed at [facility], [address], [place], [place] [address]    Hepatic function panel     Status: Abnormal   Collection Time: 01/13/23  7:40 PM  Result Value Ref Range   Total Protein 6.8 6.5 - 8.1 g/dL   Albumin 3.4 (L) 3.5 - 5.0 g/dL   AST 31 15 - 41 U/L    Comment: HEMOLYSIS AT THIS LEVEL MAY AFFECT RESULT   ALT 30 0 - 44 U/L    Comment: HEMOLYSIS AT THIS LEVEL MAY AFFECT RESULT   Alkaline Phosphatase 54 38 - 126 U/L   Total Bilirubin 0.7 <1.2 mg/dL    Comment: HEMOLYSIS AT THIS LEVEL MAY AFFECT RESULT   Bilirubin, Direct 0.4 (H) 0.0 - 0.2 mg/dL   Indirect Bilirubin 0.3 0.3 - 0.9 mg/dL    Comment: Performed at [facility], [address] [place], [place] [address]  Troponin I (High Sensitivity)     Status: Abnormal   Collection Time: 01/13/23  9:48 PM  Result Value Ref Range   Troponin I (High Sensitivity) 72 (H) <18 ng/L    Comment: RESULT CALLED TO, READ BACK BY AND VERIFIED WITH Riki Altes, RN 2242 01/13/2023 SANDOVAL K (NOTE) Elevated high sensitivity troponin I (hsTnI) values and significant  changes across serial measurements may suggest ACS but many other  chronic and acute conditions are known to elevate hsTnI results.  Refer to the "Links" section for chest pain algorithms and additional  guidance. Performed at [facility], [address], [place], [place] [address]    DG Chest Port 1 View Result Date: 01/13/2023 CLINICAL DATA:  Atrial fibrillation. EXAM: PORTABLE CHEST 1 VIEW COMPARISON:  09/25/2022 FINDINGS: Chronic cardiomegaly. Left-sided pacemaker in place. Mediastinal contours are stable. No acute airspace disease, large pleural effusion, or pneumothorax. The right costophrenic angle is not entirely included in the field of view. Slight vascular congestion without pulmonary  edema. IMPRESSION: Chronic cardiomegaly with slight vascular congestion. Electronically Signed   By: Narda Rutherford M.D.   On: 01/13/2023 19:51    Pending Labs Unresulted Labs (From admission, onward)    None        Vitals/Pain Today's Vitals   01/14/23 0615 01/14/23 0910 01/14/23 0945 01/14/23 1119  BP: 117/71 (!) 119/94 121/83 129/83  Pulse: 60 81 70 61  Resp: 13 16 20 14   Temp:  (!) 97.4 F (36.3 C)    TempSrc:      SpO2: 96% 98% 93% 97%  Weight:      Height:      PainSc:        Isolation Precautions No active isolations  Medications Medications  metoprolol succinate (TOPROL-XL) 24 hr tablet 100 mg (100 mg Oral Given 01/14/23 0911)  rosuvastatin (CRESTOR) tablet 20 mg (20 mg Oral Given 01/14/23 0911)  sacubitril-valsartan (ENTRESTO) 24-26 mg per tablet (1 tablet Oral Given 01/14/23 0911)  spironolactone (ALDACTONE) tablet 25 mg (25 mg Oral Given 01/14/23 0911)  torsemide (DEMADEX) tablet 10 mg (10 mg Oral Given 01/14/23 0911)  dapagliflozin propanediol (FARXIGA) tablet 10 mg (10 mg Oral Given 01/14/23 0911)  rivaroxaban (XARELTO) tablet 20 mg (has no administration in time range)  acetaminophen (TYLENOL) tablet 650 mg (has no administration in time range)  ondansetron (ZOFRAN) injection 4 mg (has no administration in time range)  amiodarone (NEXTERONE PREMIX) 360-4.14 MG/200ML-% (1.8 mg/mL) IV infusion (0 mg/hr Intravenous Stopped 01/14/23 0335)    Followed by  amiodarone (NEXTERONE PREMIX) 360-4.14 MG/200ML-% (1.8 mg/mL) IV infusion (30 mg/hr Intravenous New Bag/Given 01/14/23 0335)  amiodarone (PACERONE) tablet 400 mg (has no administration in time range)    Followed by  amiodarone (PACERONE) tablet 400 mg (has no administration in time range)  amiodarone (NEXTERONE) 1.8 mg/mL load via infusion 150 mg (150 mg Intravenous Bolus from Bag 01/13/23 2213)  magnesium sulfate IVPB 2 g 50 mL (0 g Intravenous Stopped 01/13/23 2348)    Mobility walks     Focused Assessments Cardiac Assessment Handoff:  Cardiac Rhythm: Atrial fibrillation Lab Results  Component Value Date   TROPONINI <0.03 11/13/2017   Lab Results  Component Value Date   DDIMER 2.16 (H) 11/27/2011   Does  the Patient currently have chest pain? No    R Recommendations: See Admitting Provider Note  Report given to:   Additional Notes:

## 2023-01-14 NOTE — ED Notes (Signed)
Patient noted to have a run of [facility] at 0230; printed the strip.  RN sent a secure chat to MD. Patient denies any changes; BP stable; alert/oriented.

## 2023-01-14 NOTE — Plan of Care (Signed)
Pt on amio for chronic    Problem: Education: Goal: Knowledge of General Education information will improve Description: Including pain rating scale, medication(s)/side effects and non-pharmacologic comfort measures Outcome: Progressing   Problem: Health Behavior/Discharge Planning: Goal: Ability to manage health-related needs will improve Outcome: Progressing   Problem: Clinical Measurements: Goal: Ability to maintain clinical measurements within normal limits will improve Outcome: Progressing Goal: Will remain free from infection Outcome: Progressing Goal: Diagnostic test results will improve Outcome: Progressing Goal: Respiratory complications will improve Outcome: Progressing Goal: Cardiovascular complication will be avoided Outcome: Progressing   Problem: Activity: Goal: Risk for activity intolerance will decrease Outcome: Progressing   Problem: Nutrition: Goal: Adequate nutrition will be maintained Outcome: Progressing   Problem: Coping: Goal: Level of anxiety will decrease Outcome: Progressing   Problem: Elimination: Goal: Will not experience complications related to bowel motility Outcome: Progressing Goal: Will not experience complications related to urinary retention Outcome: Progressing   Problem: Pain Management: Goal: General experience of comfort will improve Outcome: Progressing   Problem: Safety: Goal: Ability to remain free from injury will improve Outcome: Progressing   Problem: Skin Integrity: Goal: Risk for impaired skin integrity will decrease Outcome: Progressing

## 2023-01-14 NOTE — Consult Note (Signed)
   Electrophysiology Consultation   Patient ID: William Fischer MRN: [id number]; DOB: [birth year]  Admit date: 01/13/2023 Date of Consult: 01/14/2023  PCP:  Pcp, No   History of Present Illness:   William Fischer is a 58yo man who I am seeing today for ICD shocks at the request of Dr Lajean Manes. The patient is known to me from the outpatient setting. He has a history of chronic systolic heart failure due to nonischemic cardiomyopathy, VT s/p ICD in 08/01/2022, permanent atrial fibrillation. He has received appropriate HV therapy for VT in the past while driving.   This admission he reports getting home from dinner. He was putting away gifts when he suddenly felt bad and his ICD shocked him. He was awake during the episode. He knew something bad was happening and he laid down to avoid injury. He called 9-1-1. He received several more ICD shocks before EMS arrived. He reports no recent illnesses or missed medications.  He is now open to using amiodarone. In the ER he was started on amiodarone.   Past medical, surgical, social and family history reviewed.  ROS:  Please see the history of present illness.  All other ROS reviewed and negative.     Physical Exam/Data:   Vitals:   01/14/23 0545 01/14/23 0600 01/14/23 0615 01/14/23 0910  BP: 104/73 115/86 117/71 (!) 119/94  Pulse: 64 79 60 81  Resp: 19 18 13 16   Temp:    (!) 97.4 F (36.3 C)  TempSrc:      SpO2: 96% 96% 96% 98%  Weight:      Height:        General:  Well nourished, well developed, in no acute distres. Morbidly obese. Cardiac:  irregularly irregular Lungs:  clear to auscultation bilaterally, no wheezing, rhonchi or rales  Psych:  Normal affect   EKG:  The EKG was personally reviewed and demonstrates:  AF Telemetry:  Telemetry was personally reviewed and demonstrates:  AF. Intermittent V pacing  01/14/2023 ICD interrogation personally reviewed Longevity 10.9 years Optivol OK Lead parameters ok 6 shocks received  01/13/2023. None were successful. 1 ATP on 01/07/2023 for "VF". Event starts with a regular rapid rhythm and receives ATP while charging. ATP is unsuccessful and he receives a shock. After the shock the rhythm appears to be much more irregular and still fast. He then receives a series of shocks that are all unsuccessful.    Assessment and Plan:   #ICD shocks Has a history of appropriate shocks for VT. This event appears to be secondary to AF w/ RVR. - cont IV amiodarone gtt. Plan for 48 hours of IV amiodarone followed by oral.  #permanent AF Amiodarone as above for rate control and to help suppress known VT - if continues to have difficult to control AF rates, plan for AV nodal ablation - cont OAC  #chronic systolic heart failure NYHA II. Warm and dry. Optivol OK. Cont home medications    Sheria Lang T. Lalla Brothers, MD, [facility], [facility] Cardiac Electrophysiology

## 2023-01-15 DIAGNOSIS — I4901 Ventricular fibrillation: Secondary | ICD-10-CM | POA: Diagnosis not present

## 2023-01-15 NOTE — Plan of Care (Signed)
  Problem: Education: Goal: Knowledge of General Education information will improve Description: Including pain rating scale, medication(s)/side effects and non-pharmacologic comfort measures Outcome: Progressing   Problem: Health Behavior/Discharge Planning: Goal: Ability to manage health-related needs will improve Outcome: Progressing   Problem: Clinical Measurements: Goal: Ability to maintain clinical measurements within normal limits will improve Outcome: Progressing Goal: Will remain free from infection Outcome: Progressing Goal: Diagnostic test results will improve Outcome: Progressing Goal: Respiratory complications will improve Outcome: Progressing Goal: Cardiovascular complication will be avoided Outcome: Progressing   Problem: Activity: Goal: Risk for activity intolerance will decrease Outcome: Progressing   Problem: Nutrition: Goal: Adequate nutrition will be maintained Outcome: Progressing   Problem: Coping: Goal: Level of anxiety will decrease Outcome: Progressing   Problem: Elimination: Goal: Will not experience complications related to bowel motility Outcome: Progressing Goal: Will not experience complications related to urinary retention Outcome: Progressing   Problem: Pain Management: Goal: General experience of comfort will improve Outcome: Progressing   Problem: Safety: Goal: Ability to remain free from injury will improve Outcome: Progressing   Problem: Skin Integrity: Goal: Risk for impaired skin integrity will decrease Outcome: Progressing   Problem: Education: Goal: Knowledge of disease or condition will improve Outcome: Progressing Goal: Understanding of medication regimen will improve Outcome: Progressing Goal: Individualized Educational Video(s) Outcome: Progressing   Problem: Activity: Goal: Ability to tolerate increased activity will improve Outcome: Progressing   Problem: Cardiac: Goal: Ability to achieve and maintain  adequate cardiopulmonary perfusion will improve Outcome: Progressing   Problem: Health Behavior/Discharge Planning: Goal: Ability to safely manage health-related needs after discharge will improve Outcome: Progressing

## 2023-01-15 NOTE — Progress Notes (Signed)
° °  Rounding Note    Patient Name: BEAUMONT WHITENIGHT Date of Encounter: 01/15/2023  [facility] Cardiologist: Little Ishikawa, MD   Subjective   Frustrated at lack of sleep this AM.  Vital Signs    Vitals:   01/14/23 1930 01/14/23 2349 01/15/23 0455 01/15/23 0822  BP: 109/70 125/82 108/70 109/65  Pulse: 69  61 78  Resp: 16 16 16 16   Temp: 98.1 F (36.7 C) 97.8 F (36.6 C) 97.7 F (36.5 C) 97.8 F (36.6 C)  TempSrc: Oral Oral Oral Oral  SpO2: 97%   94%  Weight:      Height:        Intake/Output Summary (Last 24 hours) at 01/15/2023 0952 Last data filed at 01/15/2023 0827 Gross per 24 hour  Intake 1679.07 ml  Output --  Net 1679.07 ml      01/13/2023    7:35 PM 12/07/2022    4:10 PM 10/07/2022   12:21 PM  Last 3 Weights  Weight (lbs) 322 lb 302 lb 291 lb 6.4 oz  Weight (kg) 146.058 kg 136.986 kg 132.178 kg      Telemetry    AF. Intermittent pacing. - Personally Reviewed  ECG    Personally Reviewed  Physical Exam   GEN: No acute distress.  Obese. Cardiac: irregularly irregular, no murmurs, rubs, or gallops.  Respiratory: Clear to auscultation bilaterally. Psych: Normal affect   Assessment & Plan    #ICD shocks #Permanent AF - cont IV amiodarone gtt until tonight. Plan transition to oral this evening. - cont OAC   #chronic systolic heart failure NYHA II. Warm and dry. Optivol OK. Cont home medications  Tentative discharge in AM.  Sheria Lang T. Lalla Brothers, MD, [facility], [facility] Cardiac Electrophysiology

## 2023-01-16 ENCOUNTER — Other Ambulatory Visit (HOSPITAL_COMMUNITY): Payer: Self-pay

## 2023-01-16 DIAGNOSIS — I4901 Ventricular fibrillation: Secondary | ICD-10-CM | POA: Diagnosis not present

## 2023-01-16 MED ORDER — AMIODARONE HCL 200 MG PO TABS
ORAL_TABLET | ORAL | 1 refills | Status: DC
  Filled 2023-01-16: qty 50, 30d supply, fill #0
  Filled 2023-02-02: qty 50, fill #0

## 2023-01-16 NOTE — Progress Notes (Signed)
Discharge instructions reviewed with pt.  Copy of instructions given to pt. [facility] has filled pt's one script and will be picked up on the way out for discharge. Pt's son is on his way in less than 20 minutes with pt's clothes.  Pt will be d/c'd via wheelchair with belongings, with his son when he arrives with his clothes to wear home.             Will be escorted by hospital volunteer/staff.   Ludy Messamore,RN SWOT

## 2023-01-16 NOTE — Discharge Summary (Signed)
ELECTROPHYSIOLOGY DISCHARGE SUMMARY    Patient ID: William Fischer,  MRN: [id number], DOB/AGE: 1965/09/06 57 y.o.  Admit date: 01/13/2023 Discharge date: 01/16/2023  Primary Care Physician: Pcp, No  Primary Cardiologist: Little Ishikawa, MD  Electrophysiologist: Dr. Lalla Brothers   Primary Discharge Diagnosis:  Atrial Fibrillation  Secondary Discharge Diagnosis:  VT s/p ICD NICM  Procedures This Admission:  none      Brief HPI: William Fischer is a 57 y.o. male with a history of HFrEF, NICM, VT s/p ICD, perm afib admitted for HV therapy.   Hospital Course:  The patient was admitted for inappropriate HV therapy secondary to AFib w RVR. He was started on amiodarone and ventricular rates have improved. He was monitored on telemetry overnight which demonstrated atrial fibrillation.   The patient was examined and considered to be stable for discharge.   The patient will be seen back by EP APP in 4-6 weeks for post hospital care.   Physical Exam: Vitals:   01/15/23 2130 01/15/23 2245 01/16/23 0005 01/16/23 0420  BP: 116/80 (!) 115/93 125/73 92/70  Pulse: 65     Resp: 19 19 17 18   Temp: 98.2 F (36.8 C) 98.2 F (36.8 C) 98.2 F (36.8 C) 98.2 F (36.8 C)  TempSrc: Oral Oral Oral Oral  SpO2:   96% 95%  Weight:      Height:        GEN- NAD. A&O x 3.  HEENT: Normocephalic, atraumatic Lungs- CTAB, Normal effort.  Heart- irregularly irregular, No M/G/R.  GI- Soft, NT, ND.  Extremities- No clubbing, cyanosis, or edema; Groin without hematoma   Discharge Medications:  Allergies as of 01/16/2023       Reactions   Contrast Media [iodinated Contrast Media] Rash   a diffuse macular rash after CTA chest  Pt was premedicated with 125mg  IV Solumedrol, 50mg  IV Benadryl 1 hr prior to CTexam, and tolerated procedure without any difficulties. 11/28/11 Also same pre med protocol observed on 02/22/15 and pt tolerated the procedure well   Tramadol         Medication List      TAKE these medications    acetaminophen 500 MG tablet Commonly known as: TYLENOL Take 2 tablets (1,000 mg total) by mouth every 6 (six) hours as needed (pain).   amiodarone 200 MG tablet Commonly known as: PACERONE Take 2 tablets (400 mg total) by mouth 2 (two) times daily for 5 days, THEN 2 tablets (400 mg total) daily for 5 days. THEN 200mg  daily. Start taking on: January 16, 2023   b complex vitamins capsule Take 1 capsule by mouth daily.   Entresto 24-26 MG Generic drug: sacubitril-valsartan Take 1 tablet by mouth 2 (two) times daily.   Farxiga 10 MG Tabs tablet Generic drug: dapagliflozin propanediol TAKE 1 TABLET BY MOUTH EVERY DAY   Magnesium Oxide 400 MG Caps Take 1 capsule (400 mg total) by mouth daily.   metoprolol succinate 100 MG 24 hr tablet Commonly known as: TOPROL-XL Take 1 tablet (100 mg total) by mouth in the morning and at bedtime. Take with or immediately following a meal.   ONE-A-DAY 50 PLUS PO Take 1 capsule by mouth daily in the afternoon.   rosuvastatin 20 MG tablet Commonly known as: CRESTOR TAKE 1 TABLET BY MOUTH EVERY DAY   spironolactone 25 MG tablet Commonly known as: ALDACTONE Take 1 tablet (25 mg total) by mouth daily.   torsemide 10 MG tablet Commonly known as: DEMADEX TAKE 1  TABLET BY MOUTH EVERY DAY   VITAMIN C PO Take 1 tablet by mouth daily.   Xarelto 20 MG Tabs tablet Generic drug: rivaroxaban TAKE 1 TABLET BY MOUTH DAILY WITH SUPPER.        Disposition: Home with usual follow up as in AVS  Duration of Discharge Encounter: Greater than 30 minutes including physician time.  Signed, Sherie Don, NP  01/16/2023 8:02 AM

## 2023-02-01 ENCOUNTER — Ambulatory Visit (INDEPENDENT_AMBULATORY_CARE_PROVIDER_SITE_OTHER): Payer: Commercial Managed Care - HMO

## 2023-02-01 DIAGNOSIS — I5022 Chronic systolic (congestive) heart failure: Secondary | ICD-10-CM

## 2023-02-01 LAB — CUP PACEART REMOTE DEVICE CHECK
Battery Remaining Longevity: 130 mo
Battery Voltage: 3.05 V
Brady Statistic RV Percent Paced: 29.37 %
Date Time Interrogation Session: 20250114214128
HighPow Impedance: 82 Ohm
Implantable Lead Connection Status: 753985
Implantable Lead Connection Status: 753985
Implantable Lead Implant Date: 20240715
Implantable Lead Implant Date: 20240715
Implantable Lead Location: 753859
Implantable Lead Location: 753860
Implantable Lead Model: 4598
Implantable Pulse Generator Implant Date: 20240715
Lead Channel Impedance Value: 3000 Ohm
Lead Channel Impedance Value: 380 Ohm
Lead Channel Impedance Value: 456 Ohm
Lead Channel Impedance Value: 456 Ohm
Lead Channel Impedance Value: 456 Ohm
Lead Channel Impedance Value: 475 Ohm
Lead Channel Impedance Value: 513 Ohm
Lead Channel Impedance Value: 608 Ohm
Lead Channel Impedance Value: 798 Ohm
Lead Channel Impedance Value: 817 Ohm
Lead Channel Impedance Value: 836 Ohm
Lead Channel Impedance Value: 855 Ohm
Lead Channel Impedance Value: 855 Ohm
Lead Channel Pacing Threshold Amplitude: 0.375 V
Lead Channel Pacing Threshold Pulse Width: 0.4 ms
Lead Channel Sensing Intrinsic Amplitude: 13.3 mV
Lead Channel Setting Pacing Amplitude: 2 V
Lead Channel Setting Pacing Amplitude: 2.5 V
Lead Channel Setting Pacing Pulse Width: 0.4 ms
Lead Channel Setting Pacing Pulse Width: 0.4 ms
Lead Channel Setting Sensing Sensitivity: 0.3 mV
Zone Setting Status: 755011

## 2023-02-02 ENCOUNTER — Telehealth: Payer: Self-pay | Admitting: Cardiology

## 2023-02-02 ENCOUNTER — Other Ambulatory Visit: Payer: Self-pay

## 2023-02-02 MED ORDER — AMIODARONE HCL 200 MG PO TABS: 200.0000 mg | ORAL_TABLET | Freq: Every day | ORAL | 3 refills | Status: DC

## 2023-02-02 NOTE — Addendum Note (Signed)
Addended by: Margaret Pyle D on: 02/02/2023 11:45 AM   Modules accepted: Orders

## 2023-02-02 NOTE — Telephone Encounter (Signed)
*  STAT* If patient is at the pharmacy, call can be transferred to refill team.   1. Which medications need to be refilled? (please list name of each medication and dose if known) amiodarone (PACERONE) 200 MG tablet   2. Which pharmacy/location (including street and city if local pharmacy) is medication to be sent to?  [id number] - [place], [place] - [address]    3. Do they need a 30 day or 90 day supply? 90

## 2023-02-02 NOTE — Telephone Encounter (Signed)
Please see below.

## 2023-02-02 NOTE — Telephone Encounter (Signed)
Pt's medication was sent to pt's pharmacy as requested. Confirmation received.  °

## 2023-02-03 ENCOUNTER — Encounter: Payer: Self-pay | Admitting: Cardiology

## 2023-02-05 ENCOUNTER — Encounter: Payer: Self-pay | Admitting: Cardiology

## 2023-02-06 ENCOUNTER — Telehealth: Payer: Self-pay | Admitting: Cardiology

## 2023-02-06 MED ORDER — AMIODARONE HCL 200 MG PO TABS: 200.0000 mg | ORAL_TABLET | Freq: Every day | ORAL | 3 refills | Status: DC

## 2023-02-06 NOTE — Telephone Encounter (Signed)
Wrong provider

## 2023-02-06 NOTE — Telephone Encounter (Signed)
*  STAT* If patient is at the pharmacy, call can be transferred to refill team.   1. Which medications need to be refilled? (please list name of each medication and dose if known) new prescription for Amiodarone   2. Would you like to learn more about the convenience, safety, & potential cost savings by using the [facility]?     3. Are you open to using the [facility] (Type [facility].    4. Which pharmacy/location (including street and city if local pharmacy) is medication to be sent to? [facility]  [place]   5. Do they need a 30 day or 90 day supply? 90 days and refills- please call today-out of medicine

## 2023-02-06 NOTE — Telephone Encounter (Signed)
Pt's medication was sent to pt's pharmacy as requested. Confirmation received.  °
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
## 2023-02-23 ENCOUNTER — Ambulatory Visit: Payer: 59 | Attending: Physician Assistant | Admitting: Physician Assistant

## 2023-02-23 ENCOUNTER — Encounter: Payer: Self-pay | Admitting: Physician Assistant

## 2023-02-23 VITALS — BP 124/78 | HR 86 | Ht 71.0 in | Wt 297.2 lb

## 2023-02-23 DIAGNOSIS — Z79899 Other long term (current) drug therapy: Secondary | ICD-10-CM

## 2023-02-23 DIAGNOSIS — Z95810 Presence of automatic (implantable) cardiac defibrillator: Secondary | ICD-10-CM

## 2023-02-23 DIAGNOSIS — I4821 Permanent atrial fibrillation: Secondary | ICD-10-CM | POA: Diagnosis not present

## 2023-02-23 DIAGNOSIS — I428 Other cardiomyopathies: Secondary | ICD-10-CM

## 2023-02-23 DIAGNOSIS — I5022 Chronic systolic (congestive) heart failure: Secondary | ICD-10-CM

## 2023-02-23 DIAGNOSIS — I472 Ventricular tachycardia, unspecified: Secondary | ICD-10-CM | POA: Diagnosis not present

## 2023-02-23 DIAGNOSIS — D6869 Other thrombophilia: Secondary | ICD-10-CM

## 2023-02-23 LAB — CUP PACEART INCLINIC DEVICE CHECK
Date Time Interrogation Session: 20250206124434
Implantable Lead Connection Status: 753985
Implantable Lead Connection Status: 753985
Implantable Lead Implant Date: 20240715
Implantable Lead Implant Date: 20240715
Implantable Lead Location: 753859
Implantable Lead Location: 753860
Implantable Lead Model: 4598
Implantable Pulse Generator Implant Date: 20240715

## 2023-02-23 NOTE — Patient Instructions (Addendum)
 Medication Instructions:   Your physician recommends that you continue on your current medications as directed. Please refer to the Current Medication list given to you today.   *If you need a refill on your cardiac medications before your next appointment, please call your pharmacy*    Lab Work:   PLEASE GO DOWN STAIRS  [facility]  FIRST FLOOR  [address] ( GET OFF ELEVATORS MAKE A LEFT AND ANOTHER LEFT LAB ON RIGHT DOWN HALLWAY : CMET AND TSH TODAY      If you have labs (blood work) drawn today and your tests are completely normal, you will receive your results only by: MyChart Message (if you have MyChart) OR A paper copy in the mail If you have any lab test that is abnormal or we need to change your treatment, we will call you to review the results.   Testing/Procedures: NONE ORDERED  TODAY       Follow-Up: At [facility], you and your health needs are our priority.  As part of our continuing mission to provide you with exceptional heart care, we have created designated Provider Care Teams.  These Care Teams include your primary Cardiologist (physician) and Advanced Practice Providers (APPs -  Physician Assistants and Nurse Practitioners) who all work together to provide you with the care you need, when you need it.  We recommend signing up for the patient portal called MyChart.  Sign up information is provided on this After Visit Summary.  MyChart is used to connect with patients for Virtual Visits (Telemedicine).  Patients are able to view lab/test results, encounter notes, upcoming appointments, etc.  Non-urgent messages can be sent to your provider as well.   To learn more about what you can do with MyChart, go to [email or web address].    Your next appointment:   6 month(s)   Provider:   You may see OLE ONEIDA HOLTS, MD  Other Instructions         1st Floor: - Lobby - Registration  - Pharmacy  - Lab - Cafe  2nd Floor: - PV Lab - Diagnostic  Testing (echo, CT, nuclear med)  3rd Floor: - Vacant  4th Floor: - TCTS (cardiothoracic surgery) - AFib Clinic - Structural Heart Clinic - Vascular Surgery  - Vascular Ultrasound  5th Floor: - HeartCare Cardiology (general and EP) - Clinical Pharmacy for coumadin , hypertension, lipid, weight-loss medications, and med management appointments    Valet parking services will be available as well.

## 2023-02-24 LAB — TSH: TSH: 1.84 u[IU]/mL (ref 0.450–4.500)

## 2023-02-24 LAB — COMPREHENSIVE METABOLIC PANEL
ALT: 24 [IU]/L (ref 0–44)
AST: 18 [IU]/L (ref 0–40)
Albumin: 4.4 g/dL (ref 3.8–4.9)
Alkaline Phosphatase: 74 [IU]/L (ref 44–121)
BUN/Creatinine Ratio: 13 (ref 9–20)
BUN: 14 mg/dL (ref 6–24)
Bilirubin Total: 0.7 mg/dL (ref 0.0–1.2)
CO2: 24 mmol/L (ref 20–29)
Calcium: 9.7 mg/dL (ref 8.7–10.2)
Chloride: 99 mmol/L (ref 96–106)
Creatinine, Ser: 1.07 mg/dL (ref 0.76–1.27)
Globulin, Total: 3.2 g/dL (ref 1.5–4.5)
Glucose: 159 mg/dL — ABNORMAL HIGH (ref 70–99)
Potassium: 4.9 mmol/L (ref 3.5–5.2)
Sodium: 138 mmol/L (ref 134–144)
Total Protein: 7.6 g/dL (ref 6.0–8.5)
eGFR: 82 mL/min/{1.73_m2} (ref >59)

## 2023-02-25 ENCOUNTER — Encounter: Payer: Self-pay | Admitting: Cardiology

## 2023-03-05 ENCOUNTER — Other Ambulatory Visit: Payer: Self-pay | Admitting: Cardiology

## 2023-03-14 NOTE — Progress Notes (Signed)
 Remote ICD transmission.

## 2023-05-03 ENCOUNTER — Ambulatory Visit (INDEPENDENT_AMBULATORY_CARE_PROVIDER_SITE_OTHER): Payer: Commercial Managed Care - HMO

## 2023-05-03 DIAGNOSIS — I428 Other cardiomyopathies: Secondary | ICD-10-CM

## 2023-05-04 LAB — CUP PACEART REMOTE DEVICE CHECK
Battery Remaining Longevity: 127 mo
Battery Voltage: 3.02 V
Brady Statistic RV Percent Paced: 33.01 %
Date Time Interrogation Session: 20250415204354
HighPow Impedance: 87 Ohm
Implantable Lead Connection Status: 753985
Implantable Lead Connection Status: 753985
Implantable Lead Implant Date: 20240715
Implantable Lead Implant Date: 20240715
Implantable Lead Location: 753859
Implantable Lead Location: 753860
Implantable Lead Model: 4598
Implantable Pulse Generator Implant Date: 20240715
Lead Channel Impedance Value: 3000 Ohm
Lead Channel Impedance Value: 418 Ohm
Lead Channel Impedance Value: 456 Ohm
Lead Channel Impedance Value: 456 Ohm
Lead Channel Impedance Value: 456 Ohm
Lead Channel Impedance Value: 494 Ohm
Lead Channel Impedance Value: 570 Ohm
Lead Channel Impedance Value: 608 Ohm
Lead Channel Impedance Value: 817 Ohm
Lead Channel Impedance Value: 836 Ohm
Lead Channel Impedance Value: 836 Ohm
Lead Channel Impedance Value: 855 Ohm
Lead Channel Impedance Value: 855 Ohm
Lead Channel Pacing Threshold Amplitude: 0.625 V
Lead Channel Pacing Threshold Pulse Width: 0.4 ms
Lead Channel Sensing Intrinsic Amplitude: 14.1 mV
Lead Channel Setting Pacing Amplitude: 2 V
Lead Channel Setting Pacing Amplitude: 2.5 V
Lead Channel Setting Pacing Pulse Width: 0.4 ms
Lead Channel Setting Pacing Pulse Width: 0.4 ms
Lead Channel Setting Sensing Sensitivity: 0.3 mV
Zone Setting Status: 755011

## 2023-05-09 ENCOUNTER — Encounter: Payer: Self-pay | Admitting: Cardiology

## 2023-06-14 NOTE — Progress Notes (Signed)
 Remote ICD transmission.

## 2023-06-14 NOTE — Addendum Note (Signed)
 Addended by: Lott Rouleau A on: 06/14/2023 03:28 PM   Modules accepted: Orders

## 2023-06-21 ENCOUNTER — Other Ambulatory Visit: Payer: Self-pay | Admitting: Cardiology

## 2023-06-22 MED ORDER — ENTRESTO 24-26 MG PO TABS: 1.0000 | ORAL_TABLET | Freq: Two times a day (BID) | ORAL | 0 refills | Status: DC

## 2023-07-28 ENCOUNTER — Other Ambulatory Visit: Payer: Self-pay

## 2023-07-28 ENCOUNTER — Telehealth: Payer: Self-pay | Admitting: Cardiology

## 2023-07-28 MED ORDER — ROSUVASTATIN CALCIUM 20 MG PO TABS: 20.0000 mg | ORAL_TABLET | Freq: Every day | ORAL | 0 refills | Status: DC

## 2023-07-28 NOTE — Telephone Encounter (Signed)
 Pt cancelled appt because he doesn't have transportation anymore.

## 2023-07-28 NOTE — Telephone Encounter (Signed)
*  STAT* If patient is at the pharmacy, call can be transferred to refill team.   1. Which medications need to be refilled? (please list name of each medication and dose if known) rosuvastatin  (CRESTOR ) 20 MG tablet     4. Which pharmacy/location (including street and city if local pharmacy) is medication to be sent to? [id number] - [place], [place] - [address]     5. Do they need a 30 day or 90 day supply? 90    Scheduled 10/24/23

## 2023-08-02 ENCOUNTER — Ambulatory Visit (INDEPENDENT_AMBULATORY_CARE_PROVIDER_SITE_OTHER): Payer: Commercial Managed Care - HMO

## 2023-08-02 DIAGNOSIS — I428 Other cardiomyopathies: Secondary | ICD-10-CM

## 2023-08-03 LAB — CUP PACEART REMOTE DEVICE CHECK
Battery Remaining Longevity: 121 mo
Battery Voltage: 3.01 V
Brady Statistic RV Percent Paced: 40.57 %
Date Time Interrogation Session: 20250716022510
HighPow Impedance: 78 Ohm
Implantable Lead Connection Status: 753985
Implantable Lead Connection Status: 753985
Implantable Lead Implant Date: 20240715
Implantable Lead Implant Date: 20240715
Implantable Lead Location: 753859
Implantable Lead Location: 753860
Implantable Lead Model: 4598
Implantable Pulse Generator Implant Date: 20240715
Lead Channel Impedance Value: 3000 Ohm
Lead Channel Impedance Value: 361 Ohm
Lead Channel Impedance Value: 361 Ohm
Lead Channel Impedance Value: 380 Ohm
Lead Channel Impedance Value: 399 Ohm
Lead Channel Impedance Value: 418 Ohm
Lead Channel Impedance Value: 475 Ohm
Lead Channel Impedance Value: 551 Ohm
Lead Channel Impedance Value: 684 Ohm
Lead Channel Impedance Value: 703 Ohm
Lead Channel Impedance Value: 703 Ohm
Lead Channel Impedance Value: 722 Ohm
Lead Channel Impedance Value: 741 Ohm
Lead Channel Pacing Threshold Amplitude: 0.75 V
Lead Channel Pacing Threshold Pulse Width: 0.4 ms
Lead Channel Sensing Intrinsic Amplitude: 12 mV
Lead Channel Setting Pacing Amplitude: 2 V
Lead Channel Setting Pacing Amplitude: 2.5 V
Lead Channel Setting Pacing Pulse Width: 0.4 ms
Lead Channel Setting Pacing Pulse Width: 0.4 ms
Lead Channel Setting Sensing Sensitivity: 0.3 mV
Zone Setting Status: 755011

## 2023-08-05 ENCOUNTER — Ambulatory Visit: Payer: Self-pay | Admitting: Cardiology

## 2023-08-25 ENCOUNTER — Other Ambulatory Visit: Payer: Self-pay | Admitting: Cardiology

## 2023-10-17 ENCOUNTER — Other Ambulatory Visit: Payer: Self-pay | Admitting: Cardiology

## 2023-10-24 ENCOUNTER — Ambulatory Visit: Admitting: Cardiology

## 2023-10-25 NOTE — Progress Notes (Signed)
 Remote ICD Transmission

## 2023-11-01 ENCOUNTER — Ambulatory Visit: Payer: Commercial Managed Care - HMO

## 2023-11-01 DIAGNOSIS — I428 Other cardiomyopathies: Secondary | ICD-10-CM | POA: Diagnosis not present

## 2023-11-03 LAB — CUP PACEART REMOTE DEVICE CHECK
Battery Remaining Longevity: 116 mo
Battery Voltage: 3.01 V
Brady Statistic RV Percent Paced: 37.39 %
Date Time Interrogation Session: 20251016105802
HighPow Impedance: 85 Ohm
Implantable Lead Connection Status: 753985
Implantable Lead Connection Status: 753985
Implantable Lead Implant Date: 20240715
Implantable Lead Implant Date: 20240715
Implantable Lead Location: 753859
Implantable Lead Location: 753860
Implantable Lead Model: 4598
Implantable Pulse Generator Implant Date: 20240715
Lead Channel Impedance Value: 3000 Ohm
Lead Channel Impedance Value: 342 Ohm
Lead Channel Impedance Value: 361 Ohm
Lead Channel Impedance Value: 399 Ohm
Lead Channel Impedance Value: 399 Ohm
Lead Channel Impedance Value: 418 Ohm
Lead Channel Impedance Value: 418 Ohm
Lead Channel Impedance Value: 532 Ohm
Lead Channel Impedance Value: 684 Ohm
Lead Channel Impedance Value: 684 Ohm
Lead Channel Impedance Value: 684 Ohm
Lead Channel Impedance Value: 703 Ohm
Lead Channel Impedance Value: 760 Ohm
Lead Channel Pacing Threshold Amplitude: 0.625 V
Lead Channel Pacing Threshold Pulse Width: 0.4 ms
Lead Channel Sensing Intrinsic Amplitude: 10.3 mV
Lead Channel Setting Pacing Amplitude: 2 V
Lead Channel Setting Pacing Amplitude: 2.5 V
Lead Channel Setting Pacing Pulse Width: 0.4 ms
Lead Channel Setting Pacing Pulse Width: 0.4 ms
Lead Channel Setting Sensing Sensitivity: 0.3 mV
Zone Setting Status: 755011

## 2023-11-06 ENCOUNTER — Ambulatory Visit: Payer: Self-pay | Admitting: Cardiology

## 2023-11-06 NOTE — Progress Notes (Signed)
 Remote ICD Transmission

## 2023-11-23 ENCOUNTER — Telehealth: Payer: Self-pay | Admitting: Cardiology

## 2023-12-25 MED ORDER — DAPAGLIFLOZIN PROPANEDIOL 10 MG PO TABS: 10.0000 mg | ORAL_TABLET | Freq: Every day | ORAL | 0 refills | Status: DC

## 2023-12-25 MED ORDER — SPIRONOLACTONE 25 MG PO TABS: 25.0000 mg | ORAL_TABLET | Freq: Every day | ORAL | 0 refills | Status: DC

## 2023-12-25 NOTE — Telephone Encounter (Signed)
 Call placed to pt.  Scheduled him to follow-up with Dr. Kate 01/30/24, sent in enough Farxiga  and Spironolactone  until appointment, pt is aware.

## 2023-12-25 NOTE — Telephone Encounter (Signed)
*  STAT* If patient is at the pharmacy, call can be transferred to refill team.   1. Which medications need to be refilled? (please list name of each medication and dose if known)   dapagliflozin  propanediol (FARXIGA ) 10 MG TABS tablet  spironolactone  (ALDACTONE ) 25 MG tablet   2. Would you like to learn more about the convenience, safety, & potential cost savings by using the [facility]?   3. Are you open to using the [facility] (Type [facility]. ).  4. Which pharmacy/location (including street and city if local pharmacy) is medication to be sent to?  [id number] [place], [place] - [address] [place] [address]   5. Do they need a 30 day or 90 day supply?   30 day  Patient stated he still has a few tablets left.   Patient has appointment scheduled with A. Tillery, PA on 1/9.

## 2023-12-25 NOTE — Addendum Note (Signed)
 Addended by: MEMORY DELON POUR on: 12/25/2023 11:54 AM   Modules accepted: Orders

## 2024-01-26 ENCOUNTER — Ambulatory Visit: Admitting: Student

## 2024-01-28 NOTE — Progress Notes (Unsigned)
 " Cardiology Office Note:    Date:  01/30/2024   ID:  William Fischer, DOB 07-Dec-1965, MRN [id number]  PCP:  Pcp, No  Cardiologist:  Lonni LITTIE Nanas, MD  Electrophysiologist:  Soyla Gladis Norton, MD   Referring MD: No ref. provider found   Chief Complaint  Patient presents with   Congestive Heart Failure    History of Present Illness:    William Fischer is a 59 y.o. male with a hx of chronic combined systolic and diastolic heart failure, atrial fibrillation, hypertension, mesenteric embolus, colonic perforation in 2016 due to diverticulitis status post hemicolectomy and colostomy that was later reversed, former tobacco use, OSA who presents for follow-up.  He was hospitalized 08/2021 with acute on chronic combined heart failure.  Prior echocardiogram 12/2020 had shown EF 45%.  During admission 08/2021, echo showed EF 35 to 40%, mild RV systolic dysfunction, RVSP 52, severe left atrial enlargement, moderate right atrial enlargement, no significant valvular disease.  He was diuresed with IV Lasix , but unfortunately left AMA.  He had been off all his medicines for 2 months and is unable to afford.  Previously followed with Dr. Kelsie in EP, last seen 11/2017.  At that time was felt to likely have permanent A-fib.  However subsequently followed up with EP at [facility] underwent A-fib ablation 03/2019 and was started on amiodarone .  Maintained sinus rhythm for 4 to 5 months but then back to A-fib.  He was following with advanced heart failure at [facility], last seen 12/2020.  At that time, medications included amiodarone  100 mg daily, Toprol -XL 100 mg daily, rosuvastatin  10 mg daily, spironolactone  25 mg daily, torsemide  10 mg daily, Xarelto  20 mg daily, Entresto  24-26 mg twice daily.  Zio patch x 3 days on 12/22/2021 showed 11 episodes of NSVT with longest lasting 14 beats, 100% A-fib burden with average rate 100 bpm, frequent PVCs (9.3% of beats).  RHC/LHC on 01/12/2022 showed normal  coronary arteries, mildly elevated filling pressures (RA 10, RV 46/1, PA 36/24/28, PCWP 21, LVEDP 17, CI 2.0).  Echocardiogram 02/22/2022 showed EF 35 to 40%, normal RV function, severe right atrial enlargement, moderate left atrial enlargement, moderate to severe mitral regurgitation.  Cardiac MRI 05/2022 showed LVEF 33%, RVEF 49%, basal septal mid wall LGE (nonischemic scar pattern), subendocardial LGE in apical inferior wall suggesting small infarct (could represent embolic event given normal coronaries on LHC); sarcoid also on differential.  Also noted to have left lung opacity, CT chest recommended.  Status post BiV ICD insertion 07/2022.  Echocardiogram 09/2022 showed EF 35 to 40%.  Since last clinic visit, he reports he is doing well.  Denies any chest pain, dyspnea, lightheadedness, syncope, lower extremity edema, or palpitations. No bleeding issues on Xarelto .  Reports has not needed to take torsemide  recently.    Past Medical History:  Diagnosis Date   Allergy to IVP dye    Atrial fibrillation (HCC)    admx 11/13 with acute sCHF in setting of RVR  => a. failed DCCV x 2; b. Pradaxa  started;  c. failed sotalol    Chicken pox as a child   Chronic anticoagulation    Pradaxa    Chronic systolic heart failure (HCC)    a. echo 11/13: Ef 40-45%, diff HK, mod MR, mod LAE, mild RVE, mod RAE, small effusion;   b. TEE 11/13:  EF 35-40%, no LAA clot; Echo 2/14 shows normal EF   Depression with anxiety 06/29/2011   Hypertension    Mesenteric embolus  12/2014   Migraine 06/29/2011   Obesity 06/29/2011   Snoring    patient needs sleep study - has declined    Past Surgical History:  Procedure Laterality Date   BIV ICD INSERTION CRT-D N/A 08/01/2022   Procedure: BIV ICD INSERTION CRT-D;  Surgeon: Cindie Ole DASEN, MD;  Location: [facility] INVASIVE CV LAB;  Service: Cardiovascular;  Laterality: N/A;   CARDIOVERSION  11/30/2011   Procedure: CARDIOVERSION;  Surgeon: Aleene JINNY Passe, MD;  Location: [facility] ENDOSCOPY;   Service: Cardiovascular;  Laterality: N/A;   CARDIOVERSION  12/03/2011   Procedure: CARDIOVERSION;  Surgeon: Toribio JONELLE Fuel, MD;  Location: [facility] OR;  Service: Cardiovascular;  Laterality: N/A;   COLECTOMY  12/07/2015   partial   COLON SURGERY     COLOSTOMY REVERSAL  12/07/2015   COLOSTOMY REVERSAL N/A 12/07/2015   Procedure: OPEN REVERSAL OF COLOSTOMY WITH PARTIAL SIGMOID COLECTOMY;  Surgeon: Bernarda Ned, MD;  Location: [facility] OR;  Service: General;  Laterality: N/A;   HERNIA REPAIR     INCISIONAL HERNIA REPAIR  12/07/2015   INCISIONAL HERNIA REPAIR N/A 12/07/2015   Procedure: INCISIONAL HERNIA REPAIR;  Surgeon: Bernarda Ned, MD;  Location: [facility] OR;  Service: General;  Laterality: N/A;   IR GENERIC HISTORICAL  08/11/2015   IR RADIOLOGIST EVAL & MGMT 08/11/2015 Juliene Balder, MD [facility] INTERV RAD   IR GENERIC HISTORICAL  11/18/2015   IR CATHETER TUBE CHANGE 11/18/2015 Ami Bellman, DO [facility]   IR GENERIC HISTORICAL  10/15/2015   IR RADIOLOGIST EVAL & MGMT 10/15/2015 Wilkie Lent, MD [facility] INTERV RAD   IR GENERIC HISTORICAL  01/27/2016   IR RADIOLOGIST EVAL & MGMT 01/27/2016 Franky Rusk, PA-C [facility] INTERV RAD   IR GENERIC HISTORICAL  01/28/2016   IR CATHETER TUBE CHANGE 01/28/2016 Juliene Balder, MD [facility]   IR GENERIC HISTORICAL  02/17/2016   IR RADIOLOGIST EVAL & MGMT 02/17/2016 Franky Rusk, PA-C [facility] INTERV RAD   LAPAROTOMY N/A 01/17/2015   Procedure: EXPLORATORY LAPAROTOMY WITH LEFT COLECTOMY AND COLOSTOMY;  Surgeon: Donnice Bury, MD;  Location: [facility] OR;  Service: General;  Laterality: N/A;   PERIPHERALLY INSERTED CENTRAL CATHETER INSERTION  11/2015   RIGHT/LEFT HEART CATH AND CORONARY ANGIOGRAPHY N/A 01/12/2022   Procedure: RIGHT/LEFT HEART CATH AND CORONARY ANGIOGRAPHY;  Surgeon: Wonda Sharper, MD;  Location: [facility] INVASIVE CV LAB;  Service: Cardiovascular;  Laterality: N/A;   TEE WITHOUT CARDIOVERSION  11/30/2011   Procedure: TRANSESOPHAGEAL ECHOCARDIOGRAM (TEE);  Surgeon:  Aleene JINNY Passe, MD;  Location: [facility] ENDOSCOPY;  Service: Cardiovascular;  Laterality: N/A;   TONSILLECTOMY      Current Medications: Current Meds  Medication Sig   acetaminophen  (TYLENOL ) 500 MG tablet Take 2 tablets (1,000 mg total) by mouth every 6 (six) hours as needed (pain).   amiodarone  (PACERONE ) 200 MG tablet Take 1 tablet (200 mg total) by mouth daily.   Ascorbic Acid (VITAMIN C PO) Take 1 tablet by mouth daily.   b complex vitamins capsule Take 1 capsule by mouth daily.   Magnesium  Oxide 400 MG CAPS Take 1 capsule (400 mg total) by mouth daily.   Multiple Vitamins-Minerals (ONE-A-DAY 50 PLUS PO) Take 1 capsule by mouth daily in the afternoon.   [DISCONTINUED] dapagliflozin  propanediol (FARXIGA ) 10 MG TABS tablet Take 1 tablet (10 mg total) by mouth daily.   [DISCONTINUED] metoprolol  succinate (TOPROL -XL) 100 MG 24 hr tablet Take 1 tablet (100 mg total) by mouth in the morning and at bedtime. Take with or immediately following a meal.   [  DISCONTINUED] rivaroxaban  (XARELTO ) 20 MG TABS tablet TAKE 1 TABLET BY MOUTH DAILY WITH SUPPER.   [DISCONTINUED] rosuvastatin  (CRESTOR ) 20 MG tablet TAKE 1 TABLET BY MOUTH EVERY DAY   [DISCONTINUED] sacubitril -valsartan  (ENTRESTO ) 24-26 MG Take 1 tablet by mouth 2 (two) times daily.   [DISCONTINUED] spironolactone  (ALDACTONE ) 25 MG tablet Take 1 tablet (25 mg total) by mouth daily.   [DISCONTINUED] torsemide  (DEMADEX ) 10 MG tablet TAKE 1 TABLET BY MOUTH EVERY DAY (Patient taking differently: Take 10 mg by mouth daily as needed (FOR SWELLING).)     Allergies:   Contrast media [iodinated contrast media] and Tramadol   Social History   Socioeconomic History   Marital status: Divorced    Spouse name: Not on file   Number of children: Not on file   Years of education: Not on file   Highest education level: Not on file  Occupational History   Not on file  Tobacco Use   Smoking status: Former    Current packs/day: 0.00    Average packs/day: 1  pack/day for 20.0 years (20.0 ttl pk-yrs)    Types: Cigarettes    Start date: 07/10/1994    Quit date: 07/10/2014    Years since quitting: 9.5   Smokeless tobacco: Never  Substance and Sexual Activity   Alcohol use: No    Alcohol/week: 0.0 standard drinks of alcohol   Drug use: No   Sexual activity: Never  Other Topics Concern   Not on file  Social History Narrative   Not on file   Social Drivers of Health   Tobacco Use: Medium Risk (01/30/2024)   Patient History    Smoking Tobacco Use: Former    Smokeless Tobacco Use: Never    Passive Exposure: Not on file  Financial Resource Strain: Medium Risk (02/09/2022)   Overall Financial Resource Strain (CARDIA)    Difficulty of Paying Living Expenses: Somewhat hard  Food Insecurity: No Food Insecurity (01/14/2023)   Hunger Vital Sign    Worried About Running Out of Food in the Last Year: Never true    Ran Out of Food in the Last Year: Never true  Transportation Needs: Unmet Transportation Needs (01/14/2023)   PRAPARE - Administrator, Civil Service (Medical): Yes    Lack of Transportation (Non-Medical): Yes  Physical Activity: Not on file  Stress: Not on file  Social Connections: Unknown (05/28/2021)   Received from [facility]   Social Network    Social Network: Not on file  Depression (PHQ2-9): Not on file  Alcohol Screen: Not on file  Housing: Low Risk (01/14/2023)   Housing Stability Vital Sign    Unable to Pay for Housing in the Last Year: No    Number of Times Moved in the Last Year: 1    Homeless in the Last Year: No  Utilities: Not At Risk (01/14/2023)   AHC Utilities    Threatened with loss of utilities: No  Health Literacy: Not on file     Family History: The patient's family history includes Alcohol abuse in his father and paternal grandfather; Aneurysm in his father; Cancer in his maternal grandmother and paternal grandmother; Diabetes in his maternal grandfather and paternal grandmother; Hypertension  in his father and mother; Parkinsonism in his maternal grandfather.  ROS:   Please see the history of present illness.     All other systems reviewed and are negative.  EKGs/Labs/Other Studies Reviewed:    The following studies were reviewed today:   EKG:  01/30/2024: Atrial fibrillation, intermittent V pacing, rate 70, low voltage, poor R wave progression 02/08/2022: Atrial fibrillation, PVCs, rate 99 12/31/21: Afib, PVCs, rate 96 11/22/2021: Atrial fibrillation, low voltage, Q waves V1-4   Recent Labs: 02/23/2023: ALT 24; BUN 14; Creatinine, Ser 1.07; Potassium 4.9; Sodium 138; TSH 1.840  Recent Lipid Panel    Component Value Date/Time   CHOL 155 11/22/2021 1052   TRIG 73 11/22/2021 1052   HDL 55 11/22/2021 1052   CHOLHDL 2.8 11/22/2021 1052   CHOLHDL 3 07/16/2013 1412   VLDL 16.0 07/16/2013 1412   LDLCALC 86 11/22/2021 1052    Physical Exam:    VS:  BP 108/70 (BP Location: Right Arm, Patient Position: Sitting, Cuff Size: Normal)   Pulse 70   Ht 5' 10.5 (1.791 m)   Wt 294 lb (133.4 kg)   SpO2 92%   BMI 41.59 kg/m     Wt Readings from Last 3 Encounters:  01/30/24 294 lb (133.4 kg)  01/30/24 294 lb (133.4 kg)  02/23/23 297 lb 3.2 oz (134.8 kg)     GEN:  Well nourished, well developed in no acute distress HEENT: Normal NECK: No JVD; No carotid bruits CARDIAC: RRR, no murmurs, rubs, gallops RESPIRATORY:  Clear to auscultation without rales, wheezing or rhonchi  ABDOMEN: Soft, non-tender, non-distended MUSCULOSKELETAL:  No edema; No deformity  SKIN: Warm and dry NEUROLOGIC:  Alert and oriented x 3 PSYCHIATRIC:  Normal affect   ASSESSMENT:    1. Chronic combined systolic and diastolic heart failure (HCC)   2. Permanent atrial fibrillation (HCC)   3. Essential hypertension   4. NICM (nonischemic cardiomyopathy) (HCC)   5. Hyperlipidemia, unspecified hyperlipidemia type   6. Therapeutic drug monitoring       PLAN:    Chronic combined heart failure: EF  35 to 40% on echo 08/2021.  Previously followed with advanced heart failure at [facility].  RHC/LHC on 01/12/2022 showed normal coronary arteries, mildly elevated filling pressures (RA 10, RV 46/1, PA 36/24/28, PCWP 21, LVEDP 17, CI 2.0).  Echocardiogram 02/22/2022 showed EF 35 to 40%, normal RV function, severe right atrial enlargement, moderate left atrial enlargement, moderate to severe mitral regurgitation.  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   Cardiac MRI 05/2022 showed LVEF 33%, RVEF 49%, basal septal mid wall LGE (nonischemic scar pattern), subendocardial LGE in apical inferior wall suggesting small infarct (could represent embolic event given normal coronaries on LHC); sarcoid also on differential.  Status post BiV ICD insertion 07/2022.  Echocardiogram 09/2022 showed EF 35 to 40%. -Continue Entresto  24-26 mg twice daily.   -Continue spironolactone  25 mg daily -Continue toprol  XL 100 mg daily -Continue Jardiance 10 mg daily -Continue torsemide  10 mg daily as needed.  Appears euvolemic.   -Update echocardiogram -Check CMET, magnesium    Atrial fibrillation: S/p prior ablation. Likely permanent.  Zio patch 12/2021 showed 100% A-fib burden with average rate 100 bpm.  Previously on amiodarone  but was discontinued as likely chronic A-fib. -Continue Xarelto  -Continue Toprol -XL 100 mg daily - Follows with EP.  He was admitted 12/2022, had multiple ICD shocks due to RVR, started on amiodarone .  Check CMET, TSH  Frequent PVCs/NSVT:  Zio patch x 3 days on 12/22/2021 showed 11 episodes of NSVT with longest lasting 14 beats, 100% A-fib burden with average rate 100 bpm, frequent PVCs (9.3% of beats). -Continue toprol  XL 100 mg daily -Referred to EP  Hypertension: Continue metoprolol  and spironolactone  and entresto .  Appears controlled  OSA: reports compliance  with CPAP  T2DM: A1c 7.2% on 09/01/2021.  On Jardiance.  Check A1c.  Discussed importance of steps in care with  PCP  Hyperlipidemia: LDL 86 on 11/22/2021, started on atorvastatin 20 mg daily.  Check lipid panel  Left lung opacity: Noted on cardiac MRI 05/2022.  CT chest recommended but patient has declined.  Discussed in clinic today but he continues to decline.   RTC in 3 months   Medication Adjustments/Labs and Tests Ordered: Current medicines are reviewed at length with the patient today.  Concerns regarding medicines are outlined above.  Orders Placed This Encounter  Procedures   TSH   Lipid panel   Magnesium    Comprehensive metabolic panel with GFR   HgB J8r   EKG 12-Lead   ECHOCARDIOGRAM COMPLETE   Meds ordered this encounter  Medications   sacubitril -valsartan  (ENTRESTO ) 24-26 MG    Sig: Take 1 tablet by mouth 2 (two) times daily.    Dispense:  60 tablet    Refill:  0    Please Disregard Previous Rx. Pt must call our office to schedule an overdue appointment with Dr. Kate before anymore refills. [phone]. Thank you 1st attempt   dapagliflozin  propanediol (FARXIGA ) 10 MG TABS tablet    Sig: Take 1 tablet (10 mg total) by mouth daily.    Dispense:  40 tablet    Refill:  0    ENOUGH UNTIL APPT 01/30/24   torsemide  (DEMADEX ) 10 MG tablet    Sig: Take 1 tablet (10 mg total) by mouth daily as needed (FOR SWELLING).    Dispense:  30 tablet    Refill:  1   metoprolol  succinate (TOPROL -XL) 100 MG 24 hr tablet    Sig: Take 1 tablet (100 mg total) by mouth in the morning and at bedtime. Take with or immediately following a meal.    Dispense:  180 tablet    Refill:  3   rosuvastatin  (CRESTOR ) 20 MG tablet    Sig: Take 1 tablet (20 mg total) by mouth daily.    Dispense:  90 tablet    Refill:  3   rivaroxaban  (XARELTO ) 20 MG TABS tablet    Sig: Take 1 tablet (20 mg total) by mouth daily with supper.    Dispense:  90 tablet    Refill:  3    90 day supply   spironolactone  (ALDACTONE ) 25 MG tablet    Sig: Take 1 tablet (25 mg total) by mouth daily.    Dispense:  90 tablet     Refill:  3    ENOUGH UNTIL APPT 01/30/24    Patient Instructions  Medication Instructions:  Your physician recommends that you continue on your current medications as directed. Please refer to the Current Medication list given to you today.   *If you need a refill on your cardiac medications before your next appointment, please call your pharmacy*  Lab Work: LIPID/CMET/TSH/A1C/MAGNESIUM  TODAY   If you have labs (blood work) drawn today and your tests are completely normal, you will receive your results only by: MyChart Message (if you have MyChart) OR A paper copy in the mail If you have any lab test that is abnormal or we need to change your treatment, we will call you to review the results.  Testing/Procedures: Your physician has requested that you have an echocardiogram. Echocardiography is a painless test that uses sound waves to create images of your heart. It provides your doctor with information about the size and shape of your heart  and how well your hearts chambers and valves are working. This procedure takes approximately one hour. There are no restrictions for this procedure. Please do NOT wear cologne, perfume, aftershave, or lotions (deodorant is allowed). Please arrive 15 minutes prior to your appointment time.  Please note: We ask at that you not bring children with you during ultrasound (echo/ vascular) testing. Due to room size and safety concerns, children are not allowed in the ultrasound rooms during exams. Our front office staff cannot provide observation of children in our lobby area while testing is being conducted. An adult accompanying a patient to their appointment will only be allowed in the ultrasound room at the discretion of the ultrasound technician under special circumstances. We apologize for any inconvenience.   Follow-Up: At [facility], you and your health needs are our priority.  As part of our continuing mission to provide you with exceptional  heart care, our providers are all part of one team.  This team includes your primary Cardiologist (physician) and Advanced Practice Providers or APPs (Physician Assistants and Nurse Practitioners) who all work together to provide you with the care you need, when you need it.  Your next appointment:   3 month(s)  Provider:   DR KATE OR One of our Advanced Practice Providers (APPs): Morse Clause, PA-C  Lamarr Satterfield, NP Miriam Shams, NP  Olivia Pavy, PA-C Josefa Beauvais, NP  Leontine Salen, PA-C Orren Fabry, PA-C  [place], PA-C Ernest Dick, NP  Damien Braver, NP Jon Hails, PA-C  Waddell Donath, PA-C    Dayna Dunn, PA-C  Scott Weaver, PA-C Lum Louis, NP Katlyn West, NP Callie Goodrich, PA-C  Xika Zhao, NP Sheng Haley, PA-C    Kathleen Johnson, PA-C   We recommend signing up for the patient portal called MyChart.  Sign up information is provided on this After Visit Summary.  MyChart is used to connect with patients for Virtual Visits (Telemedicine).  Patients are able to view lab/test results, encounter notes, upcoming appointments, etc.  Non-urgent messages can be sent to your provider as well.   To learn more about what you can do with MyChart, go to [email or web address].   Other Instructions YOU NEED TO FIND AND GET ESTABLISHED WITH A PRIMARY CARE TO HANDLE YOUR NONCARDIAC ISSUES            Signed, Lonni LITTIE Kate, MD  01/30/2024 5:03 PM    [facility] "

## 2024-01-30 ENCOUNTER — Ambulatory Visit: Admitting: Cardiology

## 2024-01-30 ENCOUNTER — Encounter: Payer: Self-pay | Admitting: Cardiology

## 2024-01-30 ENCOUNTER — Ambulatory Visit: Payer: Self-pay | Admitting: Cardiology

## 2024-01-30 ENCOUNTER — Ambulatory Visit: Attending: Cardiology | Admitting: Cardiology

## 2024-01-30 VITALS — BP 108/70 | HR 70 | Ht 70.5 in | Wt 294.0 lb

## 2024-01-30 DIAGNOSIS — E785 Hyperlipidemia, unspecified: Secondary | ICD-10-CM | POA: Insufficient documentation

## 2024-01-30 DIAGNOSIS — I10 Essential (primary) hypertension: Secondary | ICD-10-CM | POA: Insufficient documentation

## 2024-01-30 DIAGNOSIS — Z5181 Encounter for therapeutic drug level monitoring: Secondary | ICD-10-CM | POA: Diagnosis not present

## 2024-01-30 DIAGNOSIS — I472 Ventricular tachycardia, unspecified: Secondary | ICD-10-CM

## 2024-01-30 DIAGNOSIS — Z95810 Presence of automatic (implantable) cardiac defibrillator: Secondary | ICD-10-CM | POA: Diagnosis not present

## 2024-01-30 DIAGNOSIS — I4821 Permanent atrial fibrillation: Secondary | ICD-10-CM

## 2024-01-30 DIAGNOSIS — I5042 Chronic combined systolic (congestive) and diastolic (congestive) heart failure: Secondary | ICD-10-CM | POA: Insufficient documentation

## 2024-01-30 DIAGNOSIS — I428 Other cardiomyopathies: Secondary | ICD-10-CM

## 2024-01-30 LAB — CUP PACEART INCLINIC DEVICE CHECK
Battery Remaining Longevity: 115 mo
Battery Voltage: 3.01 V
Brady Statistic AP VP Percent: 0 %
Brady Statistic AP VS Percent: 0 %
Brady Statistic AS VP Percent: 35.1 %
Brady Statistic AS VS Percent: 64.9 %
Brady Statistic RA Percent Paced: INVALID
Brady Statistic RV Percent Paced: 35.1 %
Date Time Interrogation Session: 20260113153355
HighPow Impedance: 83 Ohm
Implantable Lead Connection Status: 753985
Implantable Lead Connection Status: 753985
Implantable Lead Implant Date: 20240715
Implantable Lead Implant Date: 20240715
Implantable Lead Location: 753859
Implantable Lead Location: 753860
Implantable Lead Model: 4598
Implantable Pulse Generator Implant Date: 20240715
Lead Channel Impedance Value: 3000 Ohm
Lead Channel Impedance Value: 361 Ohm
Lead Channel Impedance Value: 380 Ohm
Lead Channel Impedance Value: 399 Ohm
Lead Channel Impedance Value: 399 Ohm
Lead Channel Impedance Value: 456 Ohm
Lead Channel Impedance Value: 475 Ohm
Lead Channel Impedance Value: 551 Ohm
Lead Channel Impedance Value: 722 Ohm
Lead Channel Impedance Value: 760 Ohm
Lead Channel Impedance Value: 760 Ohm
Lead Channel Impedance Value: 760 Ohm
Lead Channel Impedance Value: 779 Ohm
Lead Channel Pacing Threshold Amplitude: 0.625 V
Lead Channel Pacing Threshold Amplitude: 0.75 V
Lead Channel Pacing Threshold Amplitude: 0.75 V
Lead Channel Pacing Threshold Pulse Width: 0.4 ms
Lead Channel Pacing Threshold Pulse Width: 0.4 ms
Lead Channel Pacing Threshold Pulse Width: 0.4 ms
Lead Channel Sensing Intrinsic Amplitude: 12.9 mV
Lead Channel Setting Pacing Amplitude: 2 V
Lead Channel Setting Pacing Amplitude: 2.5 V
Lead Channel Setting Pacing Pulse Width: 0.4 ms
Lead Channel Setting Pacing Pulse Width: 0.4 ms
Lead Channel Setting Sensing Sensitivity: 0.3 mV
Zone Setting Status: 755011

## 2024-01-30 MED ORDER — RIVAROXABAN 20 MG PO TABS: 20.0000 mg | ORAL_TABLET | Freq: Every day | ORAL | 3 refills | Status: AC

## 2024-01-30 MED ORDER — ROSUVASTATIN CALCIUM 20 MG PO TABS: 20.0000 mg | ORAL_TABLET | Freq: Every day | ORAL | 3 refills | Status: AC

## 2024-01-30 MED ORDER — METOPROLOL SUCCINATE ER 100 MG PO TB24: 100.0000 mg | ORAL_TABLET | Freq: Two times a day (BID) | ORAL | 3 refills | Status: AC

## 2024-01-30 MED ORDER — SACUBITRIL-VALSARTAN 24-26 MG PO TABS: 1.0000 | ORAL_TABLET | Freq: Two times a day (BID) | ORAL | 0 refills | Status: AC

## 2024-01-30 MED ORDER — SPIRONOLACTONE 25 MG PO TABS: 25.0000 mg | ORAL_TABLET | Freq: Every day | ORAL | 3 refills | Status: AC

## 2024-01-30 MED ORDER — TORSEMIDE 10 MG PO TABS: 10.0000 mg | ORAL_TABLET | Freq: Every day | ORAL | 1 refills | Status: DC | PRN

## 2024-01-30 MED ORDER — DAPAGLIFLOZIN PROPANEDIOL 10 MG PO TABS: 10.0000 mg | ORAL_TABLET | Freq: Every day | ORAL | 0 refills | Status: DC

## 2024-01-30 NOTE — Patient Instructions (Addendum)
 Medication Instructions:  Your physician recommends that you continue on your current medications as directed. Please refer to the Current Medication list given to you today.  *If you need a refill on your cardiac medications before your next appointment, please call your pharmacy*  Lab Work: None ordered If you have labs (blood work) drawn today and your tests are completely normal, you will receive your results only by: MyChart Message (if you have MyChart) OR A paper copy in the mail If you have any lab test that is abnormal or we need to change your treatment, we will call you to review the results.  Follow-Up: At [facility], you and your health needs are our priority.  As part of our continuing mission to provide you with exceptional heart care, our providers are all part of one team.  This team includes your primary Cardiologist (physician) and Advanced Practice Providers or APPs (Physician Assistants and Nurse Practitioners) who all work together to provide you with the care you need, when you need it.  Your next appointment:   3-6 month(s)  Provider:   Soyla Norton, MD

## 2024-01-30 NOTE — Patient Instructions (Signed)
 Medication Instructions:  Your physician recommends that you continue on your current medications as directed. Please refer to the Current Medication list given to you today.   *If you need a refill on your cardiac medications before your next appointment, please call your pharmacy*  Lab Work: LIPID/CMET/TSH/A1C/MAGNESIUM  TODAY   If you have labs (blood work) drawn today and your tests are completely normal, you will receive your results only by: MyChart Message (if you have MyChart) OR A paper copy in the mail If you have any lab test that is abnormal or we need to change your treatment, we will call you to review the results.  Testing/Procedures: Your physician has requested that you have an echocardiogram. Echocardiography is a painless test that uses sound waves to create images of your heart. It provides your doctor with information about the size and shape of your heart and how well your hearts chambers and valves are working. This procedure takes approximately one hour. There are no restrictions for this procedure. Please do NOT wear cologne, perfume, aftershave, or lotions (deodorant is allowed). Please arrive 15 minutes prior to your appointment time.  Please note: We ask at that you not bring children with you during ultrasound (echo/ vascular) testing. Due to room size and safety concerns, children are not allowed in the ultrasound rooms during exams. Our front office staff cannot provide observation of children in our lobby area while testing is being conducted. An adult accompanying a patient to their appointment will only be allowed in the ultrasound room at the discretion of the ultrasound technician under special circumstances. We apologize for any inconvenience.   Follow-Up: At [facility], you and your health needs are our priority.  As part of our continuing mission to provide you with exceptional heart care, our providers are all part of one team.  This team  includes your primary Cardiologist (physician) and Advanced Practice Providers or APPs (Physician Assistants and Nurse Practitioners) who all work together to provide you with the care you need, when you need it.  Your next appointment:   3 month(s)  Provider:   DR KATE OR One of our Advanced Practice Providers (APPs): Morse Clause, PA-C  Lamarr Satterfield, NP Miriam Shams, NP  Olivia Pavy, PA-C Josefa Beauvais, NP  Leontine Salen, PA-C Orren Fabry, PA-C  [place], PA-C Ernest Dick, NP  Damien Braver, NP Jon Hails, PA-C  Waddell Donath, PA-C    Dayna Dunn, PA-C  Scott Weaver, PA-C Lum Louis, NP Katlyn West, NP Callie Goodrich, PA-C  Xika Zhao, NP Sheng Haley, PA-C    Kathleen Johnson, PA-C   We recommend signing up for the patient portal called MyChart.  Sign up information is provided on this After Visit Summary.  MyChart is used to connect with patients for Virtual Visits (Telemedicine).  Patients are able to view lab/test results, encounter notes, upcoming appointments, etc.  Non-urgent messages can be sent to your provider as well.   To learn more about what you can do with MyChart, go to [email or web address].   Other Instructions YOU NEED TO FIND AND GET ESTABLISHED WITH A PRIMARY CARE TO HANDLE YOUR NONCARDIAC ISSUES
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
## 2024-01-31 ENCOUNTER — Ambulatory Visit: Payer: Self-pay | Admitting: Cardiology

## 2024-01-31 ENCOUNTER — Ambulatory Visit (INDEPENDENT_AMBULATORY_CARE_PROVIDER_SITE_OTHER): Payer: Commercial Managed Care - HMO

## 2024-01-31 ENCOUNTER — Telehealth: Payer: Self-pay | Admitting: Cardiology

## 2024-01-31 DIAGNOSIS — I5042 Chronic combined systolic (congestive) and diastolic (congestive) heart failure: Secondary | ICD-10-CM | POA: Diagnosis not present

## 2024-01-31 LAB — COMPREHENSIVE METABOLIC PANEL WITH GFR
ALT: 27 IU/L (ref 0–44)
AST: 22 IU/L (ref 0–40)
Albumin: 4.5 g/dL (ref 3.8–4.9)
Alkaline Phosphatase: 62 IU/L (ref 47–123)
BUN/Creatinine Ratio: 13 (ref 9–20)
BUN: 14 mg/dL (ref 6–24)
Bilirubin Total: 0.4 mg/dL (ref 0.0–1.2)
CO2: 23 mmol/L (ref 20–29)
Calcium: 10.1 mg/dL (ref 8.7–10.2)
Chloride: 100 mmol/L (ref 96–106)
Creatinine, Ser: 1.06 mg/dL (ref 0.76–1.27)
Globulin, Total: 3 g/dL (ref 1.5–4.5)
Glucose: 117 mg/dL — ABNORMAL HIGH (ref 70–99)
Potassium: 5.2 mmol/L (ref 3.5–5.2)
Sodium: 140 mmol/L (ref 134–144)
Total Protein: 7.5 g/dL (ref 6.0–8.5)
eGFR: 82 mL/min/1.73

## 2024-01-31 LAB — LIPID PANEL
Chol/HDL Ratio: 2.1 ratio (ref 0.0–5.0)
Cholesterol, Total: 116 mg/dL (ref 100–199)
HDL: 55 mg/dL
LDL Chol Calc (NIH): 47 mg/dL (ref 0–99)
Triglycerides: 69 mg/dL (ref 0–149)
VLDL Cholesterol Cal: 14 mg/dL (ref 5–40)

## 2024-01-31 LAB — MAGNESIUM: Magnesium: 2 mg/dL (ref 1.6–2.3)

## 2024-01-31 LAB — HEMOGLOBIN A1C
Est. average glucose Bld gHb Est-mCnc: 163 mg/dL
Hgb A1c MFr Bld: 7.3 % — AB (ref 4.8–5.6)

## 2024-01-31 LAB — TSH: TSH: 1.37 u[IU]/mL (ref 0.450–4.500)

## 2024-01-31 MED ORDER — AMIODARONE HCL 200 MG PO TABS: 200.0000 mg | ORAL_TABLET | Freq: Every day | ORAL | 3 refills | Status: AC

## 2024-01-31 NOTE — Telephone Encounter (Signed)
" °*  STAT* If patient is at the pharmacy, call can be transferred to refill team.   1. Which medications need to be refilled? (please list name of each medication and dose if known) amiodarone  (PACERONE ) 200 MG tablet   2. Which pharmacy/location (including street and city if local pharmacy) is medication to be sent to? [id number] - [place], [place] - [address] [place]  3. Do they need a 30 day or 90 day supply? 60 day   "

## 2024-01-31 NOTE — Telephone Encounter (Signed)
 Yes we can refill

## 2024-02-01 LAB — CUP PACEART REMOTE DEVICE CHECK
Battery Remaining Longevity: 115 mo
Battery Voltage: 3.01 V
Brady Statistic RV Percent Paced: 28.81 %
Date Time Interrogation Session: 20260113193838
HighPow Impedance: 89 Ohm
Implantable Lead Connection Status: 753985
Implantable Lead Connection Status: 753985
Implantable Lead Implant Date: 20240715
Implantable Lead Implant Date: 20240715
Implantable Lead Location: 753859
Implantable Lead Location: 753860
Implantable Lead Model: 4598
Implantable Pulse Generator Implant Date: 20240715
Lead Channel Impedance Value: 3000 Ohm
Lead Channel Impedance Value: 380 Ohm
Lead Channel Impedance Value: 399 Ohm
Lead Channel Impedance Value: 399 Ohm
Lead Channel Impedance Value: 399 Ohm
Lead Channel Impedance Value: 437 Ohm
Lead Channel Impedance Value: 456 Ohm
Lead Channel Impedance Value: 608 Ohm
Lead Channel Impedance Value: 722 Ohm
Lead Channel Impedance Value: 760 Ohm
Lead Channel Impedance Value: 760 Ohm
Lead Channel Impedance Value: 760 Ohm
Lead Channel Impedance Value: 779 Ohm
Lead Channel Pacing Threshold Amplitude: 0.625 V
Lead Channel Pacing Threshold Pulse Width: 0.4 ms
Lead Channel Sensing Intrinsic Amplitude: 12.9 mV
Lead Channel Setting Pacing Amplitude: 2 V
Lead Channel Setting Pacing Amplitude: 2.5 V
Lead Channel Setting Pacing Pulse Width: 0.4 ms
Lead Channel Setting Pacing Pulse Width: 0.4 ms
Lead Channel Setting Sensing Sensitivity: 0.3 mV
Zone Setting Status: 755011

## 2024-02-02 ENCOUNTER — Ambulatory Visit: Payer: Self-pay | Admitting: Cardiology

## 2024-02-07 NOTE — Progress Notes (Signed)
 Remote ICD Transmission

## 2024-02-07 NOTE — Telephone Encounter (Signed)
 Refill has already been sent

## 2024-02-08 ENCOUNTER — Other Ambulatory Visit: Payer: Self-pay | Admitting: *Deleted

## 2024-02-08 MED ORDER — DAPAGLIFLOZIN PROPANEDIOL 10 MG PO TABS: 10.0000 mg | ORAL_TABLET | Freq: Every day | ORAL | 3 refills | Status: AC

## 2024-02-08 NOTE — Progress Notes (Signed)
 Medication Farxiga  10mg  has been sent to patient pharmacy with refills.

## 2024-02-22 ENCOUNTER — Other Ambulatory Visit: Payer: Self-pay | Admitting: Cardiology

## 2024-03-05 ENCOUNTER — Ambulatory Visit (HOSPITAL_COMMUNITY)

## 2024-05-06 ENCOUNTER — Ambulatory Visit: Admitting: Cardiology

## 2024-05-22 ENCOUNTER — Ambulatory Visit: Admitting: Cardiology
